# Patient Record
Sex: Female | Born: 1985 | Race: White | Hispanic: No | Marital: Married | Smoking: Never smoker
Health system: Southern US, Community
[De-identification: ages and names within clinical notes are randomized; demographics above are authoritative.]

## PROBLEM LIST (undated history)

## (undated) ENCOUNTER — Inpatient Hospital Stay (HOSPITAL_COMMUNITY): Payer: Self-pay

## (undated) VITALS — BP 124/84 | HR 89 | Temp 97.4°F | Resp 16 | Ht 65.0 in | Wt 186.0 lb

## (undated) DIAGNOSIS — K829 Disease of gallbladder, unspecified: Secondary | ICD-10-CM

## (undated) DIAGNOSIS — K3184 Gastroparesis: Secondary | ICD-10-CM

## (undated) DIAGNOSIS — E1143 Type 2 diabetes mellitus with diabetic autonomic (poly)neuropathy: Secondary | ICD-10-CM

## (undated) DIAGNOSIS — M549 Dorsalgia, unspecified: Secondary | ICD-10-CM

## (undated) DIAGNOSIS — R600 Localized edema: Secondary | ICD-10-CM

## (undated) DIAGNOSIS — F419 Anxiety disorder, unspecified: Secondary | ICD-10-CM

## (undated) DIAGNOSIS — T8859XA Other complications of anesthesia, initial encounter: Secondary | ICD-10-CM

## (undated) DIAGNOSIS — N183 Chronic kidney disease, stage 3 unspecified: Secondary | ICD-10-CM

## (undated) DIAGNOSIS — R079 Chest pain, unspecified: Secondary | ICD-10-CM

## (undated) DIAGNOSIS — D649 Anemia, unspecified: Secondary | ICD-10-CM

## (undated) DIAGNOSIS — E13319 Other specified diabetes mellitus with unspecified diabetic retinopathy without macular edema: Secondary | ICD-10-CM

## (undated) DIAGNOSIS — I10 Essential (primary) hypertension: Secondary | ICD-10-CM

## (undated) DIAGNOSIS — K219 Gastro-esophageal reflux disease without esophagitis: Secondary | ICD-10-CM

## (undated) DIAGNOSIS — R002 Palpitations: Secondary | ICD-10-CM

## (undated) DIAGNOSIS — E103419 Type 1 diabetes mellitus with severe nonproliferative diabetic retinopathy with macular edema, unspecified eye: Secondary | ICD-10-CM

## (undated) DIAGNOSIS — R51 Headache: Secondary | ICD-10-CM

## (undated) DIAGNOSIS — T4145XA Adverse effect of unspecified anesthetic, initial encounter: Secondary | ICD-10-CM

## (undated) DIAGNOSIS — R000 Tachycardia, unspecified: Secondary | ICD-10-CM

## (undated) DIAGNOSIS — R112 Nausea with vomiting, unspecified: Secondary | ICD-10-CM

## (undated) DIAGNOSIS — F319 Bipolar disorder, unspecified: Secondary | ICD-10-CM

## (undated) DIAGNOSIS — E1040 Type 1 diabetes mellitus with diabetic neuropathy, unspecified: Secondary | ICD-10-CM

## (undated) DIAGNOSIS — K5900 Constipation, unspecified: Secondary | ICD-10-CM

## (undated) DIAGNOSIS — F32A Depression, unspecified: Secondary | ICD-10-CM

## (undated) DIAGNOSIS — R0602 Shortness of breath: Secondary | ICD-10-CM

## (undated) DIAGNOSIS — N200 Calculus of kidney: Secondary | ICD-10-CM

## (undated) DIAGNOSIS — M2550 Pain in unspecified joint: Secondary | ICD-10-CM

## (undated) DIAGNOSIS — E1142 Type 2 diabetes mellitus with diabetic polyneuropathy: Secondary | ICD-10-CM

## (undated) DIAGNOSIS — Z9889 Other specified postprocedural states: Secondary | ICD-10-CM

## (undated) DIAGNOSIS — J45909 Unspecified asthma, uncomplicated: Secondary | ICD-10-CM

## (undated) DIAGNOSIS — F329 Major depressive disorder, single episode, unspecified: Secondary | ICD-10-CM

## (undated) HISTORY — DX: Unspecified asthma, uncomplicated: J45.909

## (undated) HISTORY — DX: Shortness of breath: R06.02

## (undated) HISTORY — DX: Chronic kidney disease, stage 3 unspecified: N18.30

## (undated) HISTORY — DX: Constipation, unspecified: K59.00

## (undated) HISTORY — DX: Anxiety disorder, unspecified: F41.9

## (undated) HISTORY — DX: Localized edema: R60.0

## (undated) HISTORY — DX: Gastro-esophageal reflux disease without esophagitis: K21.9

## (undated) HISTORY — DX: Chest pain, unspecified: R07.9

## (undated) HISTORY — PX: REFRACTIVE SURGERY: SHX103

## (undated) HISTORY — DX: Anemia, unspecified: D64.9

## (undated) HISTORY — DX: Palpitations: R00.2

## (undated) HISTORY — PX: CHOLECYSTECTOMY: SHX55

## (undated) HISTORY — DX: Calculus of kidney: N20.0

## (undated) HISTORY — PX: EYE SURGERY: SHX253

## (undated) HISTORY — DX: Gastroparesis: K31.84

## (undated) HISTORY — DX: Disease of gallbladder, unspecified: K82.9

## (undated) HISTORY — DX: Dorsalgia, unspecified: M54.9

## (undated) HISTORY — DX: Headache: R51

## (undated) HISTORY — DX: Pain in unspecified joint: M25.50

---
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
## 2004-03-10 ENCOUNTER — Inpatient Hospital Stay (HOSPITAL_COMMUNITY): Admission: EM | Admit: 2004-03-10 | Discharge: 2004-03-11 | Payer: Self-pay | Admitting: Emergency Medicine

## 2004-03-11 ENCOUNTER — Encounter (INDEPENDENT_AMBULATORY_CARE_PROVIDER_SITE_OTHER): Payer: Self-pay | Admitting: *Deleted

## 2005-07-16 ENCOUNTER — Inpatient Hospital Stay (HOSPITAL_COMMUNITY): Admission: EM | Admit: 2005-07-16 | Discharge: 2005-07-19 | Payer: Self-pay | Admitting: Emergency Medicine

## 2007-05-08 ENCOUNTER — Emergency Department (HOSPITAL_COMMUNITY): Admission: EM | Admit: 2007-05-08 | Discharge: 2007-05-08 | Payer: Self-pay | Admitting: Emergency Medicine

## 2008-02-07 ENCOUNTER — Ambulatory Visit: Payer: Self-pay | Admitting: Family Medicine

## 2008-08-25 ENCOUNTER — Emergency Department (HOSPITAL_COMMUNITY): Admission: EM | Admit: 2008-08-25 | Discharge: 2008-08-25 | Payer: Self-pay | Admitting: Emergency Medicine

## 2008-08-25 ENCOUNTER — Encounter (INDEPENDENT_AMBULATORY_CARE_PROVIDER_SITE_OTHER): Payer: Self-pay | Admitting: *Deleted

## 2008-10-07 ENCOUNTER — Ambulatory Visit: Payer: Self-pay | Admitting: Gastroenterology

## 2008-10-14 ENCOUNTER — Ambulatory Visit: Payer: Self-pay | Admitting: Gastroenterology

## 2009-11-09 ENCOUNTER — Ambulatory Visit: Payer: Self-pay | Admitting: Family Medicine

## 2009-11-15 ENCOUNTER — Inpatient Hospital Stay: Payer: Self-pay | Admitting: Internal Medicine

## 2010-07-16 ENCOUNTER — Encounter (INDEPENDENT_AMBULATORY_CARE_PROVIDER_SITE_OTHER): Payer: Self-pay | Admitting: *Deleted

## 2010-07-16 ENCOUNTER — Inpatient Hospital Stay (HOSPITAL_COMMUNITY): Admission: EM | Admit: 2010-07-16 | Discharge: 2010-07-19 | Payer: Self-pay | Admitting: Emergency Medicine

## 2010-07-17 ENCOUNTER — Ambulatory Visit: Payer: Self-pay | Admitting: Cardiovascular Disease

## 2010-07-17 ENCOUNTER — Encounter (INDEPENDENT_AMBULATORY_CARE_PROVIDER_SITE_OTHER): Payer: Self-pay | Admitting: *Deleted

## 2010-07-17 ENCOUNTER — Encounter (INDEPENDENT_AMBULATORY_CARE_PROVIDER_SITE_OTHER): Payer: Self-pay | Admitting: Internal Medicine

## 2010-07-18 ENCOUNTER — Ambulatory Visit: Payer: Self-pay | Admitting: Internal Medicine

## 2010-07-19 ENCOUNTER — Encounter (INDEPENDENT_AMBULATORY_CARE_PROVIDER_SITE_OTHER): Payer: Self-pay | Admitting: *Deleted

## 2010-08-01 ENCOUNTER — Ambulatory Visit: Payer: Self-pay | Admitting: Internal Medicine

## 2010-08-01 ENCOUNTER — Telehealth: Payer: Self-pay | Admitting: Internal Medicine

## 2010-08-01 DIAGNOSIS — R110 Nausea: Secondary | ICD-10-CM | POA: Insufficient documentation

## 2010-08-01 DIAGNOSIS — R1012 Left upper quadrant pain: Secondary | ICD-10-CM | POA: Insufficient documentation

## 2010-08-01 LAB — CONVERTED CEMR LAB
ALT: 23 units/L (ref 0–35)
AST: 18 units/L (ref 0–37)
Albumin: 3.6 g/dL (ref 3.5–5.2)
Alkaline Phosphatase: 165 units/L — ABNORMAL HIGH (ref 39–117)
Amylase: 25 units/L — ABNORMAL LOW (ref 27–131)
BUN: 9 mg/dL (ref 6–23)
Basophils Absolute: 0.1 10*3/uL (ref 0.0–0.1)
Basophils Relative: 1 % (ref 0.0–3.0)
CO2: 28 meq/L (ref 19–32)
Calcium: 9.8 mg/dL (ref 8.4–10.5)
Chloride: 100 meq/L (ref 96–112)
Creatinine, Ser: 0.6 mg/dL (ref 0.4–1.2)
Eosinophils Absolute: 0.4 10*3/uL (ref 0.0–0.7)
Eosinophils Relative: 4.8 % (ref 0.0–5.0)
GFR calc non Af Amer: 126.37 mL/min (ref >60)
Glucose, Bld: 115 mg/dL — ABNORMAL HIGH (ref 70–99)
HCT: 39.3 % (ref 36.0–46.0)
Hemoglobin: 13.3 g/dL (ref 12.0–15.0)
Lipase: 29 units/L (ref 11.0–59.0)
Lymphocytes Relative: 35.2 % (ref 12.0–46.0)
Lymphs Abs: 2.7 10*3/uL (ref 0.7–4.0)
MCHC: 33.8 g/dL (ref 30.0–36.0)
MCV: 86.3 fL (ref 78.0–100.0)
Monocytes Absolute: 0.5 10*3/uL (ref 0.1–1.0)
Monocytes Relative: 6.5 % (ref 3.0–12.0)
Neutro Abs: 4 10*3/uL (ref 1.4–7.7)
Neutrophils Relative %: 52.5 % (ref 43.0–77.0)
Platelets: 374 10*3/uL (ref 150.0–400.0)
Potassium: 4.2 meq/L (ref 3.5–5.1)
RBC: 4.55 M/uL (ref 3.87–5.11)
RDW: 13.9 % (ref 11.5–14.6)
Sodium: 138 meq/L (ref 135–145)
Total Bilirubin: 0.4 mg/dL (ref 0.3–1.2)
Total Protein: 6.6 g/dL (ref 6.0–8.3)
WBC: 7.6 10*3/uL (ref 4.5–10.5)

## 2010-08-02 ENCOUNTER — Ambulatory Visit: Payer: Self-pay | Admitting: Gastroenterology

## 2010-08-02 ENCOUNTER — Encounter: Payer: Self-pay | Admitting: Nurse Practitioner

## 2010-08-02 DIAGNOSIS — E1010 Type 1 diabetes mellitus with ketoacidosis without coma: Secondary | ICD-10-CM | POA: Insufficient documentation

## 2010-08-02 DIAGNOSIS — K561 Intussusception: Secondary | ICD-10-CM | POA: Insufficient documentation

## 2010-08-02 DIAGNOSIS — Z8719 Personal history of other diseases of the digestive system: Secondary | ICD-10-CM | POA: Insufficient documentation

## 2010-08-02 DIAGNOSIS — R3919 Other difficulties with micturition: Secondary | ICD-10-CM | POA: Insufficient documentation

## 2010-08-04 ENCOUNTER — Ambulatory Visit (HOSPITAL_COMMUNITY): Admission: RE | Admit: 2010-08-04 | Discharge: 2010-08-04 | Payer: Self-pay | Admitting: Internal Medicine

## 2010-08-04 LAB — CONVERTED CEMR LAB
Bilirubin Urine: NEGATIVE
Ketones, ur: NEGATIVE mg/dL
Nitrite: POSITIVE
Specific Gravity, Urine: 1.015 (ref 1.000–1.030)
Total Protein, Urine: 30 mg/dL
Urine Glucose: NEGATIVE mg/dL
Urobilinogen, UA: 0.2 (ref 0.0–1.0)
pH: 7.5 (ref 5.0–8.0)

## 2010-08-08 ENCOUNTER — Telehealth: Payer: Self-pay | Admitting: Internal Medicine

## 2010-08-08 DIAGNOSIS — E1143 Type 2 diabetes mellitus with diabetic autonomic (poly)neuropathy: Secondary | ICD-10-CM | POA: Insufficient documentation

## 2010-08-08 DIAGNOSIS — Z8639 Personal history of other endocrine, nutritional and metabolic disease: Secondary | ICD-10-CM | POA: Insufficient documentation

## 2010-08-08 DIAGNOSIS — J90 Pleural effusion, not elsewhere classified: Secondary | ICD-10-CM | POA: Insufficient documentation

## 2010-08-08 DIAGNOSIS — Z8679 Personal history of other diseases of the circulatory system: Secondary | ICD-10-CM | POA: Insufficient documentation

## 2010-08-08 DIAGNOSIS — K3184 Gastroparesis: Secondary | ICD-10-CM

## 2010-08-08 DIAGNOSIS — Z862 Personal history of diseases of the blood and blood-forming organs and certain disorders involving the immune mechanism: Secondary | ICD-10-CM | POA: Insufficient documentation

## 2010-08-08 DIAGNOSIS — Z87448 Personal history of other diseases of urinary system: Secondary | ICD-10-CM | POA: Insufficient documentation

## 2010-08-08 DIAGNOSIS — I498 Other specified cardiac arrhythmias: Secondary | ICD-10-CM

## 2010-08-09 ENCOUNTER — Ambulatory Visit: Payer: Self-pay | Admitting: Internal Medicine

## 2010-08-10 ENCOUNTER — Encounter: Payer: Self-pay | Admitting: Internal Medicine

## 2010-08-10 DIAGNOSIS — K8020 Calculus of gallbladder without cholecystitis without obstruction: Secondary | ICD-10-CM | POA: Insufficient documentation

## 2010-08-11 ENCOUNTER — Encounter (INDEPENDENT_AMBULATORY_CARE_PROVIDER_SITE_OTHER): Payer: Self-pay | Admitting: *Deleted

## 2010-08-11 ENCOUNTER — Encounter: Admission: RE | Admit: 2010-08-11 | Discharge: 2010-08-11 | Payer: Self-pay | Admitting: General Surgery

## 2010-08-11 ENCOUNTER — Telehealth: Payer: Self-pay | Admitting: Internal Medicine

## 2010-08-12 ENCOUNTER — Inpatient Hospital Stay (HOSPITAL_COMMUNITY): Admission: AD | Admit: 2010-08-12 | Discharge: 2010-08-14 | Payer: Self-pay | Admitting: General Surgery

## 2010-08-13 ENCOUNTER — Encounter (INDEPENDENT_AMBULATORY_CARE_PROVIDER_SITE_OTHER): Payer: Self-pay | Admitting: General Surgery

## 2010-08-30 ENCOUNTER — Encounter: Payer: Self-pay | Admitting: Internal Medicine

## 2010-09-06 ENCOUNTER — Ambulatory Visit: Payer: Self-pay | Admitting: Internal Medicine

## 2010-10-29 ENCOUNTER — Encounter: Payer: Self-pay | Admitting: Internal Medicine

## 2010-11-01 ENCOUNTER — Emergency Department (HOSPITAL_COMMUNITY)
Admission: EM | Admit: 2010-11-01 | Discharge: 2010-11-01 | Payer: Self-pay | Source: Home / Self Care | Admitting: Emergency Medicine

## 2010-11-08 NOTE — Progress Notes (Signed)
Summary: Symptoms are getting worse  Phone Note Call from Patient Call back at Home Phone [phone]   Call For: Dr Juanda Chance Summary of Call: Her symptoms are getting worse and Pam told her to call back immediately. Has an appt with Dr Juanda Chance tomorrow Initial call taken by: Leanor Kail [facility],  August 08, 2010 12:45 PM  Follow-up for Phone Call        Patient  is having a lot of nausea.  She has an office visit tomorrow  with  Dr Juanda Chance to discuss surgical referral.  Pain is slightly improved.  She c/o chills, but denies fever.  She is asked to remain on a clear liquid diet while having nausea can advance as tolerated.  Dr Juanda Chance can we send her something for nausea? Follow-up by: Darcey Nora RN, CGRN,  August 08, 2010 1:46 PM  Additional Follow-up for Phone Call Additional follow up Details #1::        I will be seeing her in the office tomorrow. Additional Follow-up by: Hart Carwin MD,  August 08, 2010 10:17 PM

## 2010-11-08 NOTE — Assessment & Plan Note (Signed)
Summary: F/U RUQ pain, HIDA SCAN results, saw Lafe Garin NP   History of Present Illness Primary GI MD: Sheryn Bison MD FACP FAGA Primary Provider: Reynolds Bowl MD Chief Complaint: Increased Left sided mid intermittant abd pain with constant dull discomfort. Pt states the pain is worse at night and also she has a lot of nausea.  History of Present Illness:   This is a 25 year old white female type I diabetic with persistent upper abdominal pain extending from the left to right upper quadrant. She was hospitalized with acute abdominal pain 4 weeks ago. A CT scan revealed mild pancreatitis felt to be due to ACE inhibitors or possibly biliary. She was well 2 days after discharge but then started having pain again. She has a history of gastroparesis, asthma, depression and urinary tract infections. The pain has been getting worse and she started having chills. She has not able to eat well. A hepatobiliary scan last week with [facility] showed a low ejection fraction of 15%. She was asked to take her Vicodin prior to the HIDA scan. Her liver function tests showed an initial elevation of AST 90, ALT 46 and elevated alkaline phosphatase to 141. Her most recent alkaline phosphatase has been 165 with normalization of AST to 18 and ALT to 23. Her amylase was initially 63 and lipase elevated at 66 but her most recent amylase and lipase were normal on October 24. She has been on Cipro 500 mg twice a day for the past 6 days.   GI Review of Systems    Reports abdominal pain, heartburn, and  nausea.     Location of  Abdominal pain: mid left side.    Denies acid reflux, belching, bloating, chest pain, dysphagia with liquids, dysphagia with solids, loss of appetite, vomiting, vomiting blood, weight loss, and  weight gain.        Denies anal fissure, black tarry stools, change in bowel habit, constipation, diarrhea, diverticulosis, fecal incontinence, heme positive stool, hemorrhoids, irritable bowel syndrome,  jaundice, light color stool, liver problems, rectal bleeding, and  rectal pain.    Current Medications (verified): 1)  Vicodin 5-500 Mg Tabs (Hydrocodone-Acetaminophen) .... Take 1-2 Tablets Every 4-6 Hours As Needed 2)  Ciprofloxacin Hcl 500 Mg Tabs (Ciprofloxacin Hcl) .... Take One Two Times A Day 3)  Lantus 100 Unit/ml Soln (Insulin Glargine) .... 54 Untis Daily 4)  Humalog 100 Unit/ml Soln (Insulin Lispro (Human)) .... Sliding Scale 5)  Carvedilol 25 Mg Tabs (Carvedilol) .... One Tablet By Mouth Two Times A Day 6)  Reglan 10 Mg Tabs (Metoclopramide Hcl) .... One Tablet By Mouth Four Times A Day 7)  Albuterol Sulfate (2.5 Mg/65ml) 0.083% Nebu (Albuterol Sulfate) .... As Needed 8)  Klonopin 0.5 Mg Tabs (Clonazepam) .... One Tablet By Mouth Two Times A Day  Allergies (verified): 1)  ! * Cefepime  Past History:  Past Medical History: Reviewed history from 08/02/2010 and no changes required. gastroparesis Asthma Depression Diabetes Pancreatitis Pneumonia Urinary Tract Infection  Past Surgical History: Reviewed history from 08/02/2010 and no changes required. Unremarkable  Family History: Reviewed history from 08/08/2010 and no changes required. Family History of Diabetes: brother No FH of Colon Cancer:  Social History: Reviewed history from 08/02/2010 and no changes required. 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  Alcohol Use - no Daily Caffeine Use 2 per day Illicit Drug Use - no Patient gets regular exercise.  Review of Systems  The patient denies allergy/sinus, anemia, anxiety-new, arthritis/joint pain, back pain, blood in urine,  breast changes/lumps, change in vision, confusion, cough, coughing up blood, depression-new, fainting, fatigue, fever, headaches-new, hearing problems, heart murmur, heart rhythm changes, itching, menstrual pain, muscle pains/cramps, night sweats, nosebleeds, pregnancy symptoms, shortness of breath, skin rash, sleeping problems, sore  throat, swelling of feet/legs, swollen lymph glands, thirst - excessive , urination - excessive , urination changes/pain, urine leakage, vision changes, and voice change.         .Pertinent positive and negative review of systems were noted in the above HPI. All other ROS was otherwise negative.   Vital Signs:  Patient profile:   25 year old female Height:      66 inches Weight:      174 pounds BMI:     28.19 Temp:     97.7 degrees F oral Pulse rate:   100 / minute Pulse rhythm:   regular BP sitting:   124 / 72  (right arm) Cuff size:   regular  Vitals Entered By: Christie Nottingham CMA Duncan Dull) (August 09, 2010 4:17 PM)  Physical Exam  General:  Well developed, well nourished, no acute distress. Eyes:  PERRLA, no icterus. Mouth:  No deformity or lesions, dentition normal. Neck:  Supple; no masses or thyromegaly. Lungs:  Clear throughout to auscultation. Heart:  Regular rate and rhythm; no murmurs, rubs,  or bruits. Abdomen:  soft abdomen with diffuse tenderness across left upper quadrant, epigastrium and mostly right upper quadrant. There was no rebound but the abdomen was tender on light touch. Lower abdomen was unremarkable. No CVA tenderness. No ascites. Extremities:  No clubbing, cyanosis, edema or deformities noted. Skin:  Intact without significant lesions or rashes. Psych:  Alert and cooperative. Normal mood and affect.   Impression & Recommendations:  Problem # 1:  Hx of PANCREATITIS, ACUTE, HX OF (ICD-V12.70)  Patient had recent acute pancreatitis thought to be most likely due to a biliary origin or ACE inhibitors. Her HIDA scan is abnormal and her liver function tests were slightly elevated. I feel that we are dealing with biliary pancreatitis and a poorly functioning gallbladder. She is a diabetic and will likely benefit from a laparoscopic cholecystectomy. I will keep her on antibiotics including Augmentin 875 mg daily and a strict low fat diet until she can see a  Careers adviser. If her pain becomes worse, I have asked her to go to emergency room. She had an upper endoscopy about 4 years ago.  Orders: [facility])  Problem # 2:  LIVER FUNCTION TESTS, ABNORMAL, HX OF (ICD-V12.2) Patient has mild elevation of liver function tests with no evidence of extrahepatic biliary obstruction.  Problem # 3:  DIABETES MELLITUS, TYPE I, UNCONTROLLED, WITH KETOACIDOSIS (ICD-250.13) Patient has type 1 diabetes with recent DKA. She is currently under reasonable control.  Problem # 4:  Hx of BOWEL INTUSSUSCEPTION (ICD-560.0) This was an incidental finding on a CT scan of the abdomen done on her recent admission. There are no symptoms suggestive of bowel obstruction.  Patient Instructions: 1)  Please pick up your prescriptions at the pharmacy. Electronic prescription(s) has already been sent for Augmentin 875 mg once daily x 10 days.  2)  We will contact you tommorrow morning regarding an appointment for surgical consult at [facility] 3)  Copy sent to : Reynolds Bowl, MD 4)  The medication list was reviewed and reconciled.  All changed / newly prescribed medications were explained.  A complete medication list was provided to the patient / caregiver. Prescriptions: HYDROCODONE-ACETAMINOPHEN 7.5-500  MG TABS (HYDROCODONE-ACETAMINOPHEN) Take 1 tablet every 4-6 hours as needed for severe pain  #20 x 0   Entered by:   Lamona Curl CMA (AAMA)   Authorized by:   Hart Carwin MD   Signed by:   Lamona Curl CMA (AAMA) on 08/09/2010   Method used:   Printed then faxed to ...       [facility] [address] (retail)       [address]       [place], [place]  [address]       Ph: [phone]       Fax: [phone]   RxID:   [id number]

## 2010-11-08 NOTE — Discharge Summary (Signed)
Summary: Diabetic Ketoacidosis   NAME:  [name], [name]                     ACCOUNT NO.:  [id number]   MEDICAL RECORD NO.:  [id number]                   PATIENT TYPE:  INP   LOCATION:  3702                                 FACILITY:  [facility]   PHYSICIAN:  Corinna L. Lendell Caprice, MD             DATE OF BIRTH:  [birth year]   DATE OF ADMISSION:  03/10/2004  DATE OF DISCHARGE:  03/11/2004                                 DISCHARGE SUMMARY   DIAGNOSES:  1. Diabetic ketoacidosis.  2. Acute bronchitis.   DISCHARGE MEDICATIONS:  1. Azithromycin 250 mg p.o. every day for four more days.  2. Allegra 60 mg p.o. b.i.d. as needed for allergies/congestion.  3. Flonase two sprays per nostril every day.  4. She is to continue her Lantus 56 units subcutaneously every day and     sliding scale Humalog with meals.   CONDITION AT DISCHARGE:  Stable.   ACTIVITY:  Ad lib.   FOLLOW UP:  Follow up with Dr. Talmage Nap in 1-2 weeks.   HISTORY AND HOSPITAL COURSE:  [name] is a pleasant 25 year old type 1  diabetic who presented to the emergency room with high blood sugars.  She  had a sugar that she noted at home that was over 600.  She was feeling bad,  nauseated, and feeling weak.  She admitted to poor compliance with her  diabetes regimen.  Specifically, she was not checking her sugars very  regularly, and she skipped many of her sliding scale Humalog doses.  She  reported that her blood sugars often were in the 300 and 400s at home, even  prior to this episode.  She has seen Dr. Talmage Nap in the past but has not seen  her for a year.  She complained of some ear popping, nasal congestion, and  cough.   PHYSICAL EXAMINATION:  VITAL SIGNS:  She had normal vital signs.  HEENT:  Moist mucous membranes and a serous otitis.  LUNGS:  She had clear lung sounds and otherwise normal examination.   Initial blood work, in the emergency room, showed a pH of 7.315, pCO2 was  35, pO2 was 96.  Her CBC was unremarkable.   Her sodium was 129, potassium  4.3, chloride 99, bicarbonate 14, glucose 254, and she had moderate acetone  in her blood.  UA revealed greater than 1,000 glucose and greater than 80  ketones.  Negative nitrites, negative leukocyte esterase.   Her chest x-ray was negative.   The patient was given IV hydration, insulin, and admitted to the hospital.  Her repeat B-MET showed closing of her anion gap and her sugars remained in  the 100-200 range.  During her hospitalization, she reported that her sinus  congestion, cough, and sore throat had worsened.  It was felt that she may  have an acute bronchitis and as she was in DKA, we have elected to treat  this with azithromycin and supportive care.  I have encouraged her to be  more rigorous about her diabetes regimen and she will follow up with Dr.  Talmage Nap.                                                Corinna L. Lendell Caprice, MD    CLS/MEDQ  D:  03/11/2004  T:  03/13/2004  Job:  [id number]   cc:   Dorisann Frames, M.D.  [address] [place] [address]  Fax: [phone]

## 2010-11-08 NOTE — Progress Notes (Signed)
Summary: Triage  Phone Note Call from Patient Call back at Home Phone [phone]   Caller: Patient Call For: Dr. Juanda Chance Reason for Call: Talk to Nurse Summary of Call: feels like she is running a temp and diarrhea x5 times the last hour and "yellow"....having surgery tomorrow Initial call taken by: Karna Christmas,  August 11, 2010 12:13 PM  Follow-up for Phone Call        Patient  had a CT this AM and is at [facility]  for her preop visit. She is having surgery tomorrow. She has had diarrhea x5 in the last hour and stool is yellow. Abdominal pain is worse right and left side now. Has taken Vicodin 2 by mouth. Feels like she has a fever but has not checked her temperature. Per Dr. Juanda Chance patient needs to call her surgeon for  this. Patient instructed on Dr. Regino Schultze recommendation. Follow-up by: Jesse Fall RN,  August 11, 2010 1:38 PM     Appended Document: Triage Received a call from [facility] @ pre op at [facility]. She just wanted to let us know patient did call Dr. Claud Kelp. He told patient to go to ER or "ride it out until surgery tomorrow."

## 2010-11-08 NOTE — Letter (Signed)
Summary: Out of Work  [facility]  [address] [place], [place] [address]   Phone: [phone]  Fax: [phone]    August 02, 2010   Employee:  [employer] Sheralyn Boatman    To Whom It May Concern:   For Medical reasons, please excuse the above named employee from work for the following dates:  Start:   08-01-10  End:   08-04-10  If you need additional information, please feel free to contact our office.         Sincerely,

## 2010-11-08 NOTE — Consult Note (Signed)
Summary: Abdominal Pain    NAME:  [name], [name]                ACCOUNT NO.:  [id number]      MEDICAL RECORD NO.:  [id number]          PATIENT TYPE:  INP      LOCATION:  1224                         FACILITY[facility]  [facility]      PHYSICIAN:  Angelia Mould. Derrell Lolling, M.D.DATE OF BIRTH:  1986-07-10      DATE OF CONSULTATION:  07/17/2010   DATE OF DISCHARGE:                                    CONSULTATION         CHIEF COMPLAINT:  Abdominal pain.      HISTORY:  This is a 25 year old Caucasian female with a history of   insulin-dependent diabetes.  She has had prior hospitalizations for   diabetic ketoacidosis.      She does state that she has not been taking her insulin properly and has   not been monitoring her blood sugar lately.  She was feeling okay until   the early morning hours of Saturday, July 16, 2010.  At that time, she   noted the rather abrupt onset of left upper quadrant pain, which very   quickly radiated across the entire upper abdomen and has persisted   across the entire upper abdomen.  She has had some nausea, but no   vomiting.  She has had a little bit of chills, but no fever.  She denies   ever having pain like this in the past.  She has not had any change in   her stools or bowel habits.  She has been passing flatus today.      When the pain persisted and she could not tolerate it, she came to the   [facility] Emergency Room yesterday mid morning and was admitted by the   medical service.  She was found to have a blood sugar of 761, creatinine   of 1.78, total bilirubin of 2.8, normal transaminases, lipase of 395,   white blood cell count of 8600, and a lactic acid of 3.1, consistent   with diabetic ketoacidosis.  They performed an ultrasound, which showed   the question of a tiny right kidney stone, but her gallbladder and   biliary tract looked normal.  She was thought to have mild fatty   infiltration of the liver.  A CT scan of the abdomen and pelvis was   done, which  was consistent with mild pancreatitis with stranding around   the pancreas and also showed an incidental, short segment   intussusception of the small bowel in the left upper quadrant.  There   was no inflammation or obstruction.  I have discussed this with Dr. Kennith Center and he feels that this is fairly trivial finding and this likely   to resolve spontaneously.      The CT scan results were obtained.  The internal medicine physician   called me for my opinion regarding her abdominal pain and CT scan   findings.      PAST HISTORY:   1. Insulin-dependent diabetes mellitus, type 1.   2. Asthma.   3.  Atrial tachycardia.   4. Gastroparesis.   5. She has never had any surgery.      HOME MEDICATIONS:   1. Lisinopril 10 mg a day.   2. Lasix 20 mg a day.   3. Coreg 25 mg b.i.d.   4. Clonazepam 0.5 mg p.r.n.   5. She is on Lantus insulin and sliding scale short-acting insulin.      DRUG ALLERGIES:  CEPHALOSPORINS.      SOCIAL HISTORY:  She lives across the street from her parents.  She   denies alcohol or tobacco.  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      FAMILY HISTORY:  She has a brother with diabetes, otherwise   noncontributory.      REVIEW OF SYSTEMS:  A 10-system review of systems is performed and is   noncontributory except as described above.      PHYSICAL EXAMINATION:  GENERAL:  This is an overweight Caucasian female   who is in no obvious distress at this time.  Her parents are in the room   with her.  VITAL SIGNS:  Temperature 98.2, heart rate 87 and regular   sinus rhythm, blood pressure 106/54, respirations 16 and unlabored,   oxygen saturation 100% on room air.   EYES:  Sclerae clear.  Extraocular movements intact.   EARS, NOSE, MOUTH, THROAT, NOSE, LIPS, TONGUE, AND OROPHARYNX:  Without   gross lesions.   NECK:  Supple, nontender.  No mass.  No jugular venous distention.   LUNGS:  Clear to auscultation.  No rales.  No rhonchi.  No chest  wall   tenderness.   HEART:  Regular rate and rhythm.  No murmurs.  No ectopy.  Radial and   femoral pulses are palpable.   BREASTS:  Not examined.   ABDOMEN:  Somewhat obese.  Nondistended.  Soft.  She is tender with   guarding in the epigastrium and to a lesser extent right upper quadrant   and left upper quadrant.  There are no real obvious peritoneal signs.   There is no mass.  There are no scars.  There are no hernias.  Liver and   spleen are not palpable.   EXTREMITIES:  She moves all 4 extremities well without pain or   deformity.   NEUROLOGIC:  No gross motor sensory deficits.      ASSESSMENT:   1. Acute pancreatitis.  This is the most likely cause of her abdominal       pain and abdominal tenderness.  The exact etiology of this is       unclear following review of her CT and ultrasound.   2. Small bowel intussusception.  Considering the absence of       inflammatory change or obstruction and a very short segmental       nature of this, I agree with Dr. Molli Posey that this is almost       certainly an incidental finding and will almost certainly resolve       and may have actually already resolved.  We will have to confirm       this by followup CT because she is likely to continue to have       abdominal pain from her pancreatitis.   3. Insulin-dependent diabetes mellitus.   4. Acute diabetic ketoacidosis.   5. Poor compliance with chronic management of her diabetes.      PLAN:   1. She should be kept  at bowel rest, placed on proton pump inhibitors,       and maintain her hydration and be given analgesics to treat her       pancreatitis.  Hopefully, this will resolve without complications.   2. We will repeat her CT scan of abdomen and pelvis this afternoon       with oral contrast only to make sure that the intussusception has       resolved, which is my expectation.   3. Management of her diabetes is already being taken care.               Angelia Mould. Derrell Lolling, M.D.                HMI/MEDQ  D:  07/17/2010  T:  07/18/2010  Job:  [id number]      cc:   Tonita Cong, M.D.      Hind Bosie Helper, MD      Electronically Signed by Claud Kelp M.D. on 07/21/2010 07:53:09 AM

## 2010-11-08 NOTE — Letter (Signed)
Summary: [facility]   Imported By: Sherian Rein 08/29/2010 11:47:49  _____________________________________________________________________  External Attachment:    Type:   Image     Comment:   External Document

## 2010-11-08 NOTE — Assessment & Plan Note (Signed)
Summary: f/u hospital hx pancreatitis/regina   History of Present Illness Visit Type: Initial Visit Primary GI MD: Sheryn Bison MD FACP FAGA Primary Marlo Arriola: Reynolds Bowl MD Chief Complaint: hosp f/u pancreatitis History of Present Illness:   Patient is a 25 year old female with history of Type I Diabetes, recently hospitalized with abdominal pain, nausea and DKA. CTscan revealed mild pancreatitis. Onset of pain was acute, severe. Never had this pain before. She does not drink ETOH. Pancreatitis was felt to be secondary to ACE inhibitor which she has since stopped.   Patient here today for recurrent pain which started 3 days ago while at work. Pain in LUQ with some radiation to back yesterday but not today. Pain constant but intermittently intensifies for unclear reasons. Called our office yesterday, got labs, abdominal films and Vicodin.  Yesterday pain was 7/10, today a 4/10.  Patient had been eating fried foods which worsened the pain. She is now on a liquid diet but  yesterday tolerated eggs without any increasing pain. She is taking the Vicodin but hasn't had any in last several hours.  A few days ago patient had several episodes of diarrhea but that has resolved. Then, yesterday she had urinary urgency/ presssure but none today.This morning her urine is dark, cloudy, and malodorous. Has history of UTIs but those usually cause lower abdominal pressure, her current pain is different.    She has occasional reflux when blood sugars are high.    GI Review of Systems    Reports abdominal pain, acid reflux, belching, and  bloating.     Location of  Abdominal pain: upper abdomen.    Denies chest pain, dysphagia with liquids, dysphagia with solids, heartburn, loss of appetite, nausea, vomiting, vomiting blood, weight loss, and  weight gain.      Reports constipation and  diarrhea.     Denies anal fissure, black tarry stools, change in bowel habit, diverticulosis, fecal incontinence, heme  positive stool, hemorrhoids, irritable bowel syndrome, jaundice, light color stool, liver problems, rectal bleeding, and  rectal pain.    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  Alcohol Use - no Daily Caffeine Use 2 per day Illicit Drug Use - no Patient gets regular exercise. Smoking Status:  never Drug Use:  no Does Patient Exercise:  yes  Review of Systems       The patient complains of allergy/sinus, back pain, depression-new, headaches-new, menstrual pain, skin rash, and swelling of feet/legs.  The patient denies anemia, anxiety-new, arthritis/joint pain, blood in urine, breast changes/lumps, change in vision, confusion, cough, coughing up blood, fainting, fatigue, fever, hearing problems, heart murmur, heart rhythm changes, itching, muscle pains/cramps, night sweats, nosebleeds, pregnancy symptoms, shortness of breath, sleeping problems, sore throat, swollen lymph glands, thirst - excessive , urination - excessive , urination changes/pain, urine leakage, vision changes, and voice change.    Vital Signs:  Patient profile:   25 year old female Height:      66 inches Weight:      173 pounds BMI:     28.02 Pulse rate:   72 / minute Pulse rhythm:   regular BP sitting:   110 / 60  (right arm)  Vitals Entered By: Chales Abrahams CMA Duncan Dull) (August 02, 2010 2:16 PM)  Physical Exam  General:  Well developed, well nourished, no  acute distress. Head:  Normocephalic and atraumatic. Eyes:  Conjunctiva pink, no icterus.  Mouth:  No deformity or lesions, dentition normal. Neck:  Supple; no masses or thyromegaly. Lungs:  Clear throughout to auscultation. Heart:  Regular rate and rhythm; no murmurs, rubs,  or bruits. Abdomen:  Abdomen soft,  nondistended. There is moderate LUQ and epigastric tenderness. No obvious masses or hepatomegaly.Normal bowel sounds.  Msk:  Symmetrical with no gross deformities. Normal posture. Extremities:  No palmar erythema, no edema.  Neurologic:  Alert and  oriented x4;  grossly normal neurologically. Skin:  Intact without significant lesions or rashes. Cervical Nodes:  No significant cervical adenopathy. Psych:  Alert and cooperative. Normal mood and affect.   Impression & Recommendations:  Problem # 1:  Hx of PANCREATITIS, ACUTE, HX OF (ICD-V12.70) Assessment Comment Only Hospitalized Oct. 8th through 11th for abdominal pain and DKA.  Ultrasound negative for gallstones. Non contrast CTscan of abdomen and pelvis 07/17/10 showed  mild acute pancreatitis as well as small bowel intussusception. Follow up MRCP on 07/19/10 revealed resolution of small bowel intussusception but mild perihepatic ascites, mesenteric edema, subcutaneous edema, and pericholecystic fluid were seen.  There was a question associated gallbladder wall thickening as well.  CBD and pancreatic ducts were normal.   Exact etiology of pancreatitis unknown but felt to have possibly been related to ace inhibitor. Although pancreatic divisum could not be totally excluded, the typical dorsal duct dilatation seen in pancreas divisum was not evident on MRCP.  Problem # 2:  ABDOMINAL PAIN, LEFT UPPER QUADRANT AND EPIGASTRIUM Assessment: Deteriorated Same pain as when found to have mild acute pancreatitis. KUB, amylase, lipase, CBC and CMET done yesterday were all normal. MRCP did show some pericholecystic fluid and possible gallbladder wall thickening. After calling our office yesterday patient was started on Cipro and scheduled for HIDA scan. We have arranged for patient to have HIDA scan done within next two days. In meantime, continue Vicodin as needed, stay on clear liquid diet. We should have HIDA results by Friday.   Problem # 3:  OTHER  ABNORMALITY OF URINATION (HYQ-657.84) Assessment: New Complains of cloudy, malodorous urine. Will check U/A.   Problem # 4:  DIABETES MELLITUS, TYPE I, UNCONTROLLED, WITH KETOACIDOSIS (ICD-250.13) Recently hospitalized with DKA.   Problem # 5:  Hx of SMALL BOWEL INTUSSUSCEPTION (ICD-560.0) Assessment: Comment Only Resolved. It should be noted that patient was evaluated in hospital by surgery who didn't feel this was cause of her abdominal pain.   Other Orders: HIDA Scan (HIDA SCAN) TLB-Udip w/ Micro (81001-URINE)  Patient Instructions: 1)  Clear liquid until Thursday the 27th.  2)  We scheduled the Hida Scan for 08-04-10 at [facility]. 3)  Directions provided. Location [facility].  4)  We will call you with the Hida Scan results. 5)  Continue the Cipro.  6)  If fever or severe abdominal pain call our office, [phone]. 7)  We made you an appointment with Dr. Juanda Chance for 09-06-10 at 2:45 PM . 8)  Copy sent to : Reynolds Bowl, Md

## 2010-11-08 NOTE — Assessment & Plan Note (Signed)
Summary: F/U HX Pancreatitis, ABD pain, saw Lafe Garin NP   History of Present Illness Visit Type: Follow-up Visit Primary GI MD: Lina Sar MD Primary Provider: Jerrye Noble, MD  Requesting Provider: na Chief Complaint: F/u for pancreatitis. Pt c/o occ diarrhea and denies any other GI complaints  History of Present Illness:   This is a 25 year old white female Type 1 diabetic who is status post laparoscopic cholecystectomy 3 weeks ago by Dr Derrell Lolling. She is feeling well. Patient denies abdominal pain, fever or diarrhea. She has continued on  diabetic diet. She had an abnormal HIDA scan prior to the lap chole  with 15% ejection fraction consistent with biliary dyskinesia. She required hospitalization for pancreatitis and abnormal liver function tests. She is now doing well.   GI Review of Systems      Denies abdominal pain, acid reflux, belching, bloating, chest pain, dysphagia with liquids, dysphagia with solids, heartburn, loss of appetite, nausea, vomiting, vomiting blood, weight loss, and  weight gain.      Reports diarrhea.     Denies anal fissure, black tarry stools, change in bowel habit, constipation, diverticulosis, fecal incontinence, heme positive stool, hemorrhoids, irritable bowel syndrome, jaundice, light color stool, liver problems, rectal bleeding, and  rectal pain.    Current Medications (verified): 1)  Lantus 100 Unit/ml Soln (Insulin Glargine) .... 56 Untis Daily 2)  Humalog 100 Unit/ml Soln (Insulin Lispro (Human)) .... Sliding Scale 3)  Carvedilol 25 Mg Tabs (Carvedilol) .... One Tablet By Mouth Two Times A Day 4)  Reglan 10 Mg Tabs (Metoclopramide Hcl) .... One Tablet By Mouth Four Times A Day 5)  Albuterol Sulfate (2.5 Mg/16ml) 0.083% Nebu (Albuterol Sulfate) .... As Needed 6)  Klonopin 0.5 Mg Tabs (Clonazepam) .... One Tablet By Mouth Two Times A Day 7)  Keflex 500 Mg Caps (Cephalexin) .... One Tablet By Mouth Two Times A Day For Toe Infection  Allergies  (verified): 1)  ! * Cefepime  Past History:  Past Medical History: Gastroparesis Asthma Depression Diabetes Pancreatitis Pneumonia Urinary Tract Infection  Biliary dyskinesia  Past Surgical History: Laparoscopic cholecystectomy with intraoperative cholangiogram  Family History: Reviewed history from 08/08/2010 and no changes required. Family History of Diabetes: brother No FH of Colon Cancer:  Social History: Reviewed history from 08/02/2010 and no changes required. 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  Alcohol Use - no Daily Caffeine Use 2 per day Illicit Drug Use - no Patient gets regular exercise.  Review of Systems  The patient denies allergy/sinus, anemia, anxiety-new, arthritis/joint pain, back pain, blood in urine, breast changes/lumps, change in vision, confusion, cough, coughing up blood, depression-new, fainting, fatigue, fever, headaches-new, hearing problems, heart murmur, heart rhythm changes, itching, menstrual pain, muscle pains/cramps, night sweats, nosebleeds, pregnancy symptoms, shortness of breath, skin rash, sleeping problems, sore throat, swelling of feet/legs, swollen lymph glands, thirst - excessive , urination - excessive , urination changes/pain, urine leakage, vision changes, and voice change.         Pertinent positive and negative review of systems were noted in the above HPI. All other ROS was otherwise negative.   Vital Signs:  Patient profile:   25 year old female Height:      66 inches Weight:      179 pounds BMI:     29.00 BSA:     1.91 Pulse rate:   104 / minute Pulse rhythm:   regular BP sitting:   124 / 80  (left arm) Cuff size:  regular  Vitals Entered By: Ok Anis CMA (September 06, 2010 2:56 PM)  Physical Exam  General:  Well developed, well nourished, no acute distress. Eyes:  PERRLA, no icterus. Mouth:  No deformity or lesions, dentition normal. Neck:  Supple; no masses or thyromegaly. Lungs:  Clear throughout  to auscultation. Heart:  Regular rate and rhythm; no murmurs, rubs,  or bruits. Abdomen:  post laparoscopic cholecystectomy scars. Normoactive bowel sounds. No tenderness Extremities:  No clubbing, cyanosis, edema or deformities noted. Skin:  Intact without significant lesions or rashes. Psych:  Alert and cooperative. Normal mood and affect.   Impression & Recommendations:  Problem # 1:  CHOLELITHIASIS (ICD-574.20) Patient is status post laparoscopic cholecystectomy on 08/13/10. She is doing well.  Problem # 2:  GASTROPARESIS (ICD-536.3) There are currently no symptoms of gastroparesis.  Problem # 3:  Hx of BOWEL INTUSSUSCEPTION (ICD-560.0) This was an incidental finding only. No symptoms at this time.  Patient Instructions: 1)  Diabetic modified diet. 2)  Return p.r.n. 3)  Copy sent to : Dr Reynolds Bowl 4)  The medication list was reviewed and reconciled.  All changed / newly prescribed medications were explained.  A complete medication list was provided to the patient / caregiver.

## 2010-11-08 NOTE — Discharge Summary (Signed)
Summary: Pancreatitis, Small Bowel Intusssception  [name], [name]                ACCOUNT NO.:  [id number]      MEDICAL RECORD NO.:  [id number]          PATIENT TYPE:  INP      LOCATION:  1414                         FACILITY:  [facility]      PHYSICIAN:  Hind I Elsaid, MD      DATE OF BIRTH:  [birth year]      DATE OF ADMISSION:  07/16/2010   DATE OF DISCHARGE:  07/19/2010                                  DISCHARGE SUMMARY         PRIMARY CARE PHYSICIAN:  Unassigned.      DISCHARGE DIAGNOSES:   1. Diabetic ketoacidosis, severe.   2. Acute mild pancreatitis, improved.   3. Small bowel intussusception, resolved.   4. History of atrial tachycardia.   5. Acute renal failure, resolved.   6. Mildly elevated LFTs, will be followed by Dr. Juanda Chance.   7. Hypophosphatemia, corrected.   8. Elevated cardiac enzymes, felt to be secondary to the acute       illness.      A 2-D echo did show ejection fraction of 60% to 65% with normal systolic   function and no wall motion abnormalities.      HISTORY OF PRESENT ILLNESS:  This is a 25 year old female with a history   of insulin-dependent diabetes mellitus, noncompliance; history of   tachycardia and asthma.  She was admitted with severe upper abdominal   pain, diffuse, associated with nausea.  On presentation, the patient had   vital signs, temperature 97.9, blood pressure 137/75, pulse rate was   126, respiratory rate of 30.  Abdominal examination, there was diffuse   tenderness.  She was found to have a sodium of 147, potassium 5.6,   chloride 99, CO2 of 6, glucose of 760, BUN of 19, and creatinine of   1.78.  Also, alkaline phosphatase of 199.  The patient admitted for   diabetic ketoacidosis to ICU, which responded to aggressive IV fluid and   diabetic protocol of Glucommander.  The patient out of her DKA and the   patient has started on her insulin 54 units subcutaneous nightly with   NovoLog sliding scale.  During hospital stay, the patient  mentioned   condition associated with severe abdominal pain.  For that, CT of the   abdomen and pelvis was done which showed acute mild pancreatitis, but   also there was small bowel intussusception.  Regarding the   intussusception, Surgery consulted and Dr. Derrell Lolling recommended repeat the   CT scan.  Repeated CT scan did show resolution of the intussusception.   Gastroenterology consulted for evaluation of her acute pancreatitis.   Her lipid profile was not significantly elevated of triglyceride and   this could be followed by diet and exercise.  The Gastroenterology did   see the patient and they recommend MRCP to rule out any anatomical   pancreatic divisum, currently the MRCP did not clearly ruling out   pancreatic divisum.  Dr. Juanda Chance recommend to follow up the patient with   her within 4-6 weeks.  Also, suggesting there is edema and we think the   edema most probably secondary to the fluid the patient received.   Currently, the patient admitted.  She is completely pain-free and would   like to be discharged home.      Elevated troponin, the patient noticed to have elevated troponin,   cardiac enzyme of 0.1, but soon gradually coming to normal.  Her 2-D   echo did show ejection fraction of 60% to 65% and the patient had no   chest pain.  Her ejection fraction did show normal sinus rhythm.  Aortic   valve, there was no stenosis and aortic root was normal in size.   Ascending aorta was normal and there was normal central venous pressure.   The elevated troponin felt secondary to acute of her illness.  Also, the   patient noticed to have elevated lactic acidosis and this felt secondary   to the severity of her DKA.      PROCEDURES DONE:   1. Abdominal x-ray, normal bowel gas pattern and ultrasound of the       abdomen did show mild fullness of the right intrarenal collecting       system, probably tiny stone in the upper pole, change in the right       kidney may reflect right urinary  calculus.  CT abdomen and pelvis       with contrast consistent with mild acute pancreatitis.  Left upper       quadrant incidental small bowel intussusception.   2. CT of the abdomen and pelvis, repeat.  Interval resolution of small       bowel intussusception.   3. Bilateral striated nephrograms suggesting possible urinary tract       infection, correlate clinically.  MRCP was small bilateral pleural       effusion with mild prehepatic ascites, mesenteric edema,       subcutaneous edema, and pericholecystic fluid.  There may be       associated gallbladder wall thickening as well.  No gallstone       identified.  No biliary dilation or pancreatic duct dilation.  Mild       pre-portal edema is suggested.  The pancreatic duct is highly       indistinct, this secludes pancreatic divisum based on her       projectile course.      The patient's blood workup today, lipase of 66, sodium 138, potassium   3.9, chloride 109, CO2 of 24, glucose 137, BUN of 20, creatinine of   0.43, total bilirubin of 5, alkaline phosphatase 141.  AST 19, ALT 46,   amylase was 63.  We felt the patient is medically stable to be   discharged.  The patient needs to resume her Lantus of 54 units in   addition to NovoLog 1 unit for every 7 g carb eaten and 1 unit per every   50 mg up to a goal of 120 mg.  She will see Dr. Sharl Ma, her diabetic   endocrinologist.  The patient on the list of getting insulin pump.  Her   last hemoglobin A1c was more than 13.  The patient is noncompliance with   her diabetic regimen.  Education was done in hospital stay.  Currently,   we felt the patient is stable to be discharged, need to follow up with   Dr. Sharl Ma, and need to follow up with Dr. Juanda Chance.  Hind Bosie Helper, MD               HIE/MEDQ  D:  07/20/19

## 2010-11-08 NOTE — Progress Notes (Signed)
Summary: Triage  Phone Note Call from Patient Call back at Home Phone [phone]   Caller: Patient Call For: Dr. Juanda Chance Summary of Call: Saw Dr. Juanda Chance in hosp. 2wks ago and having Left side abd pain like she did in the hosp.  Initial call taken by: Karna Christmas,  August 01, 2010 8:53 AM  Follow-up for Phone Call        C/O left abdominal pain that started on Saturday. Pain got better then came back last night. Pain is above belly button. States "It is shooting pain that goes to my back." Diarrhea yesterday everytime she ate. No BM today. Pain increased again this AM. Ibuprofen not helping. Denies nausea, fever. Felt bloated a couple of days ago. Recommendations per Dr Juanda Chance: Lab: CBC, Amylase, Lipase, Cmet and KUB today. Appointment with Willette Cluster, RNP on 08/02/10 @ 2 PM. Vicodan 1-2 every 4-6 hours  by mouth as needed. Patient aware. Follow-up by: Jesse Fall RN,  August 01, 2010 10:04 AM  New Problems: ABDOMINAL PAIN, LEFT UPPER QUADRANT (ICD-789.02)   New Problems: ABDOMINAL PAIN, LEFT UPPER QUADRANT (ICD-789.02) New/Updated Medications: VICODIN 5-500 MG TABS (HYDROCODONE-ACETAMINOPHEN) Take 1-2 tablets every 4-6 hours as needed Prescriptions: VICODIN 5-500 MG TABS (HYDROCODONE-ACETAMINOPHEN) Take 1-2 tablets every 4-6 hours as needed  #30 x 0   Entered by:   Jesse Fall RN   Authorized by:   Hart Carwin MD   Signed by:   Jesse Fall RN on 08/01/2010   Method used:   Printed then faxed to ...       [facility] [phone]* (retail)       [address] [place], [place]  [address]       Ph: [phone]       Fax: [phone]   RxID:   [id number]   Appended Document: Triage    Clinical Lists Changes  Orders: Added new Test order of 1 View Abdomen (KUB) (74000TC) - Signed

## 2010-11-10 NOTE — Letter (Signed)
Summary: [facility]   Imported By: Sherian Rein 10/04/2010 09:54:57  _____________________________________________________________________  External Attachment:    Type:   Image     Comment:   External Document

## 2010-12-14 ENCOUNTER — Emergency Department: Payer: Self-pay | Admitting: Emergency Medicine

## 2010-12-20 LAB — GLUCOSE, CAPILLARY
Glucose-Capillary: 104 mg/dL — ABNORMAL HIGH (ref 70–99)
Glucose-Capillary: 122 mg/dL — ABNORMAL HIGH (ref 70–99)
Glucose-Capillary: 126 mg/dL — ABNORMAL HIGH (ref 70–99)
Glucose-Capillary: 172 mg/dL — ABNORMAL HIGH (ref 70–99)
Glucose-Capillary: 183 mg/dL — ABNORMAL HIGH (ref 70–99)
Glucose-Capillary: 194 mg/dL — ABNORMAL HIGH (ref 70–99)
Glucose-Capillary: 205 mg/dL — ABNORMAL HIGH (ref 70–99)
Glucose-Capillary: 320 mg/dL — ABNORMAL HIGH (ref 70–99)
Glucose-Capillary: 444 mg/dL — ABNORMAL HIGH (ref 70–99)
Glucose-Capillary: 69 mg/dL — ABNORMAL LOW (ref 70–99)
Glucose-Capillary: 78 mg/dL (ref 70–99)
Glucose-Capillary: 89 mg/dL (ref 70–99)
Glucose-Capillary: 90 mg/dL (ref 70–99)

## 2010-12-20 LAB — CBC
HCT: 33.6 % — ABNORMAL LOW (ref 36.0–46.0)
HCT: 38.5 % (ref 36.0–46.0)
Hemoglobin: 11.4 g/dL — ABNORMAL LOW (ref 12.0–15.0)
Hemoglobin: 13 g/dL (ref 12.0–15.0)
MCH: 28.8 pg (ref 26.0–34.0)
MCH: 28.8 pg (ref 26.0–34.0)
MCHC: 33.7 g/dL (ref 30.0–36.0)
MCHC: 33.8 g/dL (ref 30.0–36.0)
MCV: 85.6 fL (ref 78.0–100.0)
RDW: 13.4 % (ref 11.5–15.5)

## 2010-12-20 LAB — BASIC METABOLIC PANEL
BUN: 5 mg/dL — ABNORMAL LOW (ref 6–23)
CO2: 24 mEq/L (ref 19–32)
GFR calc non Af Amer: >60 mL/min (ref >60)
Glucose, Bld: 233 mg/dL — ABNORMAL HIGH (ref 70–99)
Potassium: 3.7 mEq/L (ref 3.5–5.1)

## 2010-12-20 LAB — URINALYSIS, ROUTINE W REFLEX MICROSCOPIC
Bilirubin Urine: NEGATIVE
Glucose, UA: >1000 mg/dL — AB
Hgb urine dipstick: NEGATIVE
Ketones, ur: 40 mg/dL — AB
Leukocytes, UA: NEGATIVE
Nitrite: NEGATIVE
Protein, ur: NEGATIVE mg/dL
Specific Gravity, Urine: 1.031 — ABNORMAL HIGH (ref 1.005–1.030)
Urobilinogen, UA: 0.2 mg/dL (ref 0.0–1.0)
pH: 5.5 (ref 5.0–8.0)

## 2010-12-20 LAB — COMPREHENSIVE METABOLIC PANEL
BUN: 14 mg/dL (ref 6–23)
CO2: 23 mEq/L (ref 19–32)
Calcium: 9.1 mg/dL (ref 8.4–10.5)
Creatinine, Ser: 1.04 mg/dL (ref 0.4–1.2)
GFR calc Af Amer: >60 mL/min (ref >60)
GFR calc non Af Amer: >60 mL/min (ref >60)
Glucose, Bld: 666 mg/dL (ref 70–99)

## 2010-12-20 LAB — SURGICAL PCR SCREEN
MRSA, PCR: NEGATIVE
Staphylococcus aureus: NEGATIVE

## 2010-12-20 LAB — KETONES, QUALITATIVE

## 2010-12-20 LAB — URINE MICROSCOPIC-ADD ON

## 2010-12-20 LAB — GLUCOSE, RANDOM: Glucose, Bld: 735 mg/dL (ref 70–99)

## 2010-12-20 LAB — LIPASE, BLOOD: Lipase: 29 U/L (ref 11–59)

## 2010-12-20 LAB — PREGNANCY, URINE: Preg Test, Ur: NEGATIVE

## 2010-12-22 LAB — COMPREHENSIVE METABOLIC PANEL
ALT: 16 U/L (ref 0–35)
ALT: 20 U/L (ref 0–35)
AST: 13 U/L (ref 0–37)
AST: 18 U/L (ref 0–37)
Albumin: 2.4 g/dL — ABNORMAL LOW (ref 3.5–5.2)
Albumin: 2.8 g/dL — ABNORMAL LOW (ref 3.5–5.2)
Alkaline Phosphatase: 141 U/L — ABNORMAL HIGH (ref 39–117)
BUN: 10 mg/dL (ref 6–23)
BUN: 2 mg/dL — ABNORMAL LOW (ref 6–23)
BUN: 5 mg/dL — ABNORMAL LOW (ref 6–23)
CO2: 6 mEq/L — CL (ref 19–32)
Calcium: 8.1 mg/dL — ABNORMAL LOW (ref 8.4–10.5)
Calcium: 8.2 mg/dL — ABNORMAL LOW (ref 8.4–10.5)
Chloride: 99 mEq/L (ref 96–112)
Creatinine, Ser: 0.89 mg/dL (ref 0.4–1.2)
Creatinine, Ser: 1.78 mg/dL — ABNORMAL HIGH (ref 0.4–1.2)
GFR calc Af Amer: 42 mL/min — ABNORMAL LOW (ref >60)
GFR calc Af Amer: >60 mL/min (ref >60)
GFR calc non Af Amer: 35 mL/min — ABNORMAL LOW (ref >60)
Glucose, Bld: 137 mg/dL — ABNORMAL HIGH (ref 70–99)
Glucose, Bld: 200 mg/dL — ABNORMAL HIGH (ref 70–99)
Glucose, Bld: 761 mg/dL (ref 70–99)
Potassium: 3.9 mEq/L (ref 3.5–5.1)
Sodium: 134 mEq/L — ABNORMAL LOW (ref 135–145)
Sodium: 135 mEq/L (ref 135–145)
Total Bilirubin: 2.8 mg/dL — ABNORMAL HIGH (ref 0.3–1.2)
Total Protein: 4.6 g/dL — ABNORMAL LOW (ref 6.0–8.3)
Total Protein: 4.6 g/dL — ABNORMAL LOW (ref 6.0–8.3)
Total Protein: 5.2 g/dL — ABNORMAL LOW (ref 6.0–8.3)

## 2010-12-22 LAB — CULTURE, BLOOD (ROUTINE X 2): Culture  Setup Time: 201110082044

## 2010-12-22 LAB — BASIC METABOLIC PANEL
BUN: 10 mg/dL (ref 6–23)
BUN: 12 mg/dL (ref 6–23)
BUN: 18 mg/dL (ref 6–23)
BUN: 5 mg/dL — ABNORMAL LOW (ref 6–23)
BUN: 7 mg/dL (ref 6–23)
BUN: 8 mg/dL (ref 6–23)
CO2: 19 mEq/L (ref 19–32)
CO2: 21 mEq/L (ref 19–32)
Calcium: 8 mg/dL — ABNORMAL LOW (ref 8.4–10.5)
Calcium: 8.3 mg/dL — ABNORMAL LOW (ref 8.4–10.5)
Calcium: 8.4 mg/dL (ref 8.4–10.5)
Chloride: 109 mEq/L (ref 96–112)
Chloride: 110 mEq/L (ref 96–112)
Chloride: 117 mEq/L — ABNORMAL HIGH (ref 96–112)
Chloride: 120 mEq/L — ABNORMAL HIGH (ref 96–112)
Chloride: 120 mEq/L — ABNORMAL HIGH (ref 96–112)
Creatinine, Ser: 0.6 mg/dL (ref 0.4–1.2)
Creatinine, Ser: 0.9 mg/dL (ref 0.4–1.2)
GFR calc Af Amer: >60 mL/min (ref >60)
GFR calc Af Amer: >60 mL/min (ref >60)
GFR calc non Af Amer: 40 mL/min — ABNORMAL LOW (ref >60)
GFR calc non Af Amer: >60 mL/min (ref >60)
GFR calc non Af Amer: >60 mL/min (ref >60)
GFR calc non Af Amer: >60 mL/min (ref >60)
Glucose, Bld: 121 mg/dL — ABNORMAL HIGH (ref 70–99)
Glucose, Bld: 183 mg/dL — ABNORMAL HIGH (ref 70–99)
Glucose, Bld: 378 mg/dL — ABNORMAL HIGH (ref 70–99)
Potassium: 3.2 mEq/L — ABNORMAL LOW (ref 3.5–5.1)
Potassium: 3.2 mEq/L — ABNORMAL LOW (ref 3.5–5.1)
Potassium: 3.3 mEq/L — ABNORMAL LOW (ref 3.5–5.1)
Potassium: 3.6 mEq/L (ref 3.5–5.1)
Potassium: 3.7 mEq/L (ref 3.5–5.1)
Potassium: 4.1 mEq/L (ref 3.5–5.1)
Sodium: 132 mEq/L — ABNORMAL LOW (ref 135–145)
Sodium: 138 mEq/L (ref 135–145)
Sodium: 143 mEq/L (ref 135–145)
Sodium: 145 mEq/L (ref 135–145)
Sodium: 147 mEq/L — ABNORMAL HIGH (ref 135–145)

## 2010-12-22 LAB — LACTIC ACID, PLASMA: Lactic Acid, Venous: 4.2 mmol/L — ABNORMAL HIGH (ref 0.5–2.2)

## 2010-12-22 LAB — DIFFERENTIAL
Basophils Absolute: 0 10*3/uL (ref 0.0–0.1)
Basophils Relative: 0 % (ref 0–1)
Eosinophils Absolute: 0 10*3/uL (ref 0.0–0.7)
Eosinophils Relative: 0 % (ref 0–5)
Neutrophils Relative %: 80 % — ABNORMAL HIGH (ref 43–77)

## 2010-12-22 LAB — LIPASE, BLOOD
Lipase: 175 U/L — ABNORMAL HIGH (ref 11–59)
Lipase: 395 U/L — ABNORMAL HIGH (ref 11–59)
Lipase: 66 U/L — ABNORMAL HIGH (ref 11–59)

## 2010-12-22 LAB — GLUCOSE, CAPILLARY
Glucose-Capillary: 121 mg/dL — ABNORMAL HIGH (ref 70–99)
Glucose-Capillary: 125 mg/dL — ABNORMAL HIGH (ref 70–99)
Glucose-Capillary: 130 mg/dL — ABNORMAL HIGH (ref 70–99)
Glucose-Capillary: 137 mg/dL — ABNORMAL HIGH (ref 70–99)
Glucose-Capillary: 140 mg/dL — ABNORMAL HIGH (ref 70–99)
Glucose-Capillary: 147 mg/dL — ABNORMAL HIGH (ref 70–99)
Glucose-Capillary: 153 mg/dL — ABNORMAL HIGH (ref 70–99)
Glucose-Capillary: 161 mg/dL — ABNORMAL HIGH (ref 70–99)
Glucose-Capillary: 174 mg/dL — ABNORMAL HIGH (ref 70–99)
Glucose-Capillary: 179 mg/dL — ABNORMAL HIGH (ref 70–99)
Glucose-Capillary: 181 mg/dL — ABNORMAL HIGH (ref 70–99)
Glucose-Capillary: 184 mg/dL — ABNORMAL HIGH (ref 70–99)
Glucose-Capillary: 188 mg/dL — ABNORMAL HIGH (ref 70–99)
Glucose-Capillary: 199 mg/dL — ABNORMAL HIGH (ref 70–99)
Glucose-Capillary: 205 mg/dL — ABNORMAL HIGH (ref 70–99)
Glucose-Capillary: 205 mg/dL — ABNORMAL HIGH (ref 70–99)
Glucose-Capillary: 209 mg/dL — ABNORMAL HIGH (ref 70–99)
Glucose-Capillary: 214 mg/dL — ABNORMAL HIGH (ref 70–99)
Glucose-Capillary: 221 mg/dL — ABNORMAL HIGH (ref 70–99)
Glucose-Capillary: 226 mg/dL — ABNORMAL HIGH (ref 70–99)
Glucose-Capillary: 232 mg/dL — ABNORMAL HIGH (ref 70–99)
Glucose-Capillary: 282 mg/dL — ABNORMAL HIGH (ref 70–99)
Glucose-Capillary: 464 mg/dL — ABNORMAL HIGH (ref 70–99)
Glucose-Capillary: 596 mg/dL (ref 70–99)
Glucose-Capillary: >600 mg/dL (ref 70–99)

## 2010-12-22 LAB — CARDIAC PANEL(CRET KIN+CKTOT+MB+TROPI)
CK, MB: 1.1 ng/mL (ref 0.3–4.0)
CK, MB: 1.1 ng/mL (ref 0.3–4.0)
CK, MB: 1.3 ng/mL (ref 0.3–4.0)
CK, MB: 1.5 ng/mL (ref 0.3–4.0)
Relative Index: INVALID (ref 0.0–2.5)
Relative Index: INVALID (ref 0.0–2.5)
Relative Index: INVALID (ref 0.0–2.5)
Total CK: 24 U/L (ref 7–177)
Total CK: 26 U/L (ref 7–177)
Total CK: 33 U/L (ref 7–177)
Troponin I: 0.07 ng/mL — ABNORMAL HIGH (ref 0.00–0.06)
Troponin I: 0.08 ng/mL — ABNORMAL HIGH (ref 0.00–0.06)
Troponin I: 0.1 ng/mL — ABNORMAL HIGH (ref 0.00–0.06)
Troponin I: 0.12 ng/mL — ABNORMAL HIGH (ref 0.00–0.06)
Troponin I: 0.14 ng/mL — ABNORMAL HIGH (ref 0.00–0.06)
Troponin I: 0.15 ng/mL — ABNORMAL HIGH (ref 0.00–0.06)

## 2010-12-22 LAB — URINALYSIS, ROUTINE W REFLEX MICROSCOPIC
Glucose, UA: >1000 mg/dL — AB
Leukocytes, UA: NEGATIVE
Protein, ur: NEGATIVE mg/dL
Specific Gravity, Urine: 1.03 (ref 1.005–1.030)
pH: 5.5 (ref 5.0–8.0)

## 2010-12-22 LAB — URINE CULTURE: Culture: NO GROWTH

## 2010-12-22 LAB — LIPID PANEL
LDL Cholesterol: 73 mg/dL (ref 0–99)
Total CHOL/HDL Ratio: 2.3 RATIO
VLDL: 66 mg/dL — ABNORMAL HIGH (ref 0–40)

## 2010-12-22 LAB — CBC
HCT: 29.3 % — ABNORMAL LOW (ref 36.0–46.0)
HCT: 30.2 % — ABNORMAL LOW (ref 36.0–46.0)
HCT: 40.2 % (ref 36.0–46.0)
Hemoglobin: 13.1 g/dL (ref 12.0–15.0)
MCH: 28.6 pg (ref 26.0–34.0)
MCH: 29.2 pg (ref 26.0–34.0)
MCHC: 33.4 g/dL (ref 30.0–36.0)
MCHC: 33.7 g/dL (ref 30.0–36.0)
MCHC: 34 g/dL (ref 30.0–36.0)
MCV: 85.6 fL (ref 78.0–100.0)
MCV: 85.9 fL (ref 78.0–100.0)
Platelets: 195 10*3/uL (ref 150–400)
Platelets: 286 10*3/uL (ref 150–400)
RBC: 3.88 MIL/uL (ref 3.87–5.11)
RBC: 4.49 MIL/uL (ref 3.87–5.11)
RDW: 13.9 % (ref 11.5–15.5)
RDW: 14.3 % (ref 11.5–15.5)
RDW: 14.5 % (ref 11.5–15.5)
WBC: 5.1 10*3/uL (ref 4.0–10.5)

## 2010-12-22 LAB — BLOOD GAS, VENOUS
Drawn by: 316901
O2 Saturation: 74.8 %
Patient temperature: 98.6

## 2010-12-22 LAB — TSH: TSH: 0.429 u[IU]/mL (ref 0.350–4.500)

## 2010-12-22 LAB — PHOSPHORUS: Phosphorus: 2 mg/dL — ABNORMAL LOW (ref 2.3–4.6)

## 2010-12-22 LAB — BLOOD GAS, ARTERIAL
Acid-base deficit: 14.8 mmol/L — ABNORMAL HIGH (ref 0.0–2.0)
Bicarbonate: 10.2 mEq/L — ABNORMAL LOW (ref 20.0–24.0)
O2 Saturation: 99.1 %
TCO2: 9.4 mmol/L (ref 0–100)
pO2, Arterial: 140 mmHg — ABNORMAL HIGH (ref 80.0–100.0)

## 2010-12-22 LAB — AMYLASE: Amylase: 338 U/L — ABNORMAL HIGH (ref 0–105)

## 2010-12-22 LAB — URINE MICROSCOPIC-ADD ON

## 2010-12-22 LAB — MRSA PCR SCREENING: MRSA by PCR: NEGATIVE

## 2010-12-22 LAB — MAGNESIUM: Magnesium: 1.9 mg/dL (ref 1.5–2.5)

## 2011-01-18 ENCOUNTER — Emergency Department (HOSPITAL_COMMUNITY): Payer: Medicaid Other

## 2011-01-18 ENCOUNTER — Inpatient Hospital Stay (HOSPITAL_COMMUNITY)
Admission: EM | Admit: 2011-01-18 | Discharge: 2011-01-19 | DRG: 781 | Disposition: A | Discharge status: Other Acute Inpatient Hospital | Payer: Medicaid Other | Attending: Internal Medicine | Admitting: Internal Medicine

## 2011-01-18 DIAGNOSIS — O24919 Unspecified diabetes mellitus in pregnancy, unspecified trimester: Secondary | ICD-10-CM | POA: Diagnosis present

## 2011-01-18 DIAGNOSIS — K3184 Gastroparesis: Secondary | ICD-10-CM | POA: Diagnosis present

## 2011-01-18 DIAGNOSIS — J45909 Unspecified asthma, uncomplicated: Secondary | ICD-10-CM | POA: Diagnosis present

## 2011-01-18 DIAGNOSIS — Z794 Long term (current) use of insulin: Secondary | ICD-10-CM

## 2011-01-18 DIAGNOSIS — O99891 Other specified diseases and conditions complicating pregnancy: Secondary | ICD-10-CM | POA: Diagnosis present

## 2011-01-18 DIAGNOSIS — N10 Acute tubulo-interstitial nephritis: Secondary | ICD-10-CM | POA: Diagnosis present

## 2011-01-18 DIAGNOSIS — O2390 Unspecified genitourinary tract infection in pregnancy, unspecified trimester: Principal | ICD-10-CM | POA: Diagnosis present

## 2011-01-18 DIAGNOSIS — Z9641 Presence of insulin pump (external) (internal): Secondary | ICD-10-CM

## 2011-01-18 DIAGNOSIS — E109 Type 1 diabetes mellitus without complications: Secondary | ICD-10-CM | POA: Diagnosis present

## 2011-01-18 LAB — DIFFERENTIAL
Basophils Relative: 0 % (ref 0–1)
Eosinophils Absolute: 0.2 10*3/uL (ref 0.0–0.7)
Lymphs Abs: 1.9 10*3/uL (ref 0.7–4.0)
Monocytes Absolute: 0.6 10*3/uL (ref 0.1–1.0)
Monocytes Relative: 7 % (ref 3–12)
Neutro Abs: 5.5 10*3/uL (ref 1.7–7.7)
Neutrophils Relative %: 67 % (ref 43–77)

## 2011-01-18 LAB — COMPREHENSIVE METABOLIC PANEL
BUN: 6 mg/dL (ref 6–23)
CO2: 23 mEq/L (ref 19–32)
Calcium: 8.6 mg/dL (ref 8.4–10.5)
Chloride: 105 mEq/L (ref 96–112)
Creatinine, Ser: 0.62 mg/dL (ref 0.4–1.2)
GFR calc non Af Amer: >60 mL/min (ref >60)
Total Bilirubin: 0.5 mg/dL (ref 0.3–1.2)

## 2011-01-18 LAB — GLUCOSE, CAPILLARY: Glucose-Capillary: 52 mg/dL — ABNORMAL LOW (ref 70–99)

## 2011-01-18 LAB — URINALYSIS, ROUTINE W REFLEX MICROSCOPIC
Glucose, UA: NEGATIVE mg/dL
Protein, ur: 30 mg/dL — AB
Specific Gravity, Urine: 1.017 (ref 1.005–1.030)
pH: 6 (ref 5.0–8.0)

## 2011-01-18 LAB — CBC
Hemoglobin: 11.1 g/dL — ABNORMAL LOW (ref 12.0–15.0)
MCH: 27.7 pg (ref 26.0–34.0)
MCHC: 34.3 g/dL (ref 30.0–36.0)
MCV: 80.8 fL (ref 78.0–100.0)
RBC: 4.01 MIL/uL (ref 3.87–5.11)

## 2011-01-18 LAB — URINE MICROSCOPIC-ADD ON

## 2011-01-18 LAB — LIPASE, BLOOD: Lipase: 20 U/L (ref 11–59)

## 2011-01-19 LAB — GLUCOSE, CAPILLARY: Glucose-Capillary: 151 mg/dL — ABNORMAL HIGH (ref 70–99)

## 2011-01-20 LAB — URINE CULTURE
Colony Count: >=100000
Culture  Setup Time: 201204120346

## 2011-01-21 NOTE — H&P (Signed)
NAMEMarland West  [name], [name]                ACCOUNT NO.:  [id number]  MEDICAL RECORD NO.:  [id number]           PATIENT TYPE:  E  LOCATION:  WLED                         FACILITY:  [facility]  PHYSICIAN:  Vania Rea, M.D. DATE OF BIRTH:  1986/08/22  DATE OF ADMISSION:  01/18/2011 DATE OF DISCHARGE:                             HISTORY & PHYSICAL   PRIMARY CARE PHYSICIAN:  Tonita Cong, MD  OBSTETRICIAN/GYNECOLOGIST:  Dr. Stefanie Libel, at [facility].  CHIEF COMPLAINT:  Back pain and nausea and vomiting since yesterday.  HISTORY OF PRESENT ILLNESS:  This is a 25 year old Caucasian lady reportedly [redacted] weeks pregnant and being seen at [facility] because of high-risk pregnancy.  She does have a history of diabetes type 1.  She was seen by her obstetrician 2 weeks ago for back pain, found to have a right-sided kidney stone treated with pain medications, and Macrobid as an outpatient and completed a course of Macrobid about 1 week ago, was much improved, but then yesterday had sudden and progressive onset of central back pain associated with nausea and vomiting.  The patient denies dysuria, hematuria, or frequency.  In the emergency room, the patient was evaluated for persistent kidney stone, eventually had an MRI, which revealed evidence of a right-sided pyelonephritis.  The patient was discussed with Dr. Stefanie Libel at [facility], who according to the emergency room physician felt that the patient was not at a stage where she could be admitted to Labor and Delivery, that her treatment is primarily medical, but if she desired to be treated at [facility], she would need to go to the emergency room to be evaluated from there. The patient elected not to go to the [facility] Emergency Room, and the hospitalist was called again.  Prior to evaluating the patient, the patient was discussed with Dr. Osborn Coho, on-call for OB/GYN at [facility] who indicated that the protocol was not to  admit obstetrical case as long as the current physician was comfortable treating the patient at present location.  Hospitalist Service therefore decided to admit this patient on to the internal medicine service for management of pyelonephritis.  It is of note the patient does not have high fever, has no history of leukocytosis.  However, her obstetrician did indicate that he felt she should get intravenous antibiotics.  PAST MEDICAL HISTORY: 1. Diabetes type 1. 2. Past history of pancreatitis. 3. History of small bowel intussusception in October 2011. 4. Atrial tachycardia. 5. She is status post cholecystectomy. 6. Gastroparesis. 7. Asthma.  MEDICATIONS: 1. Tylenol #3 every 6 hours as needed. 2. Prenatal vitamins. 3. Humalog via insulin pump. 4. Albuterol via inhaler 1-2 puffs every 6 hours as needed. 5. Reglan 10 mg 4 times daily as needed.  ALLERGIES:  CEPHALOSPORIN causes a rash.  The patient does not have a problem with Keflex, amoxicillin, or any other penicillins.  SOCIAL HISTORY:  She works as a Child psychotherapist.  There is no history of tobacco, alcohol, or illicit drug use.  Her code status is full code.  FAMILY HISTORY:  Significant for a brother with diabetes type 1; and both parents  with hypertension and diabetes type 2.  REVIEW OF SYSTEMS:  Other than noted above, unremarkable.  PHYSICAL EXAMINATION:  GENERAL:  A very pleasant young Caucasian lady reclining in the stretcher, not in acute distress at this time. VITAL SIGNS:  Her temperature is 98.4, pulse 98, respirations 16, blood pressure 159/91.  She is saturating 99% on room air. HEENT:  Her pupils are round and equal.  Mucous membranes pink and anicteric.  She is not dehydrated. NECK:  No cervical lymphadenopathy or thyromegaly.  No carotid bruit. CHEST:  Clear to auscultation bilaterally. CARDIOVASCULAR SYSTEM:  Regular rhythm.  No murmur. ABDOMEN:  Soft, nontender.  Abdomen is somewhat obese. EXTREMITIES:   Without edema.  She has no calf tenderness. CENTRAL NERVOUS SYSTEM:  Cranial nerves II-XII are grossly intact.  She has no focal neurologic deficit.  LABORATORY DATA:  Her white count is 8.2, hemoglobin 11.1, MCV 8.8, platelets 249.  She has a normal differential.  Sodium is 135, potassium 3.8, chloride 105, CO2 of 23, glucose 168, BUN 6, creatinine 0.6.  Her albumin is 2.7.  Her liver functions otherwise unremarkable.  Her lipase is 20.  Her urinalysis shows a white count of 21-50 with many bacteria. Urinalysis shows 30 proteins, moderate leukocyte esterase; otherwise unremarkable.  MRI of the abdomen was done which showed a gravid uterus. No fetal anomalies seen, anterior placenta, mild dilation of the mid-to- distal right ureter, likely secondary to extrinsic compression via a gravid uterus, normal appendix, no inflammatory changes at location. She did have mild right perirenal edema worrisome for pyelonephritis.  ASSESSMENT: 1. Acute pyelonephritis, extremely mild. 2. Urinary tract infection due to acute pyelonephritis. 3. Diabetes type 1, fair control. 4. Asthma. 5. Gastroparesis. 6. Normal pregnancy.  PLAN:  We will admit this lady for intravenous antibiotic therapy and discharge home when she is able to eat a full diet without difficulty.     Vania Rea, M.D.     LC/MEDQ  D:  01/18/2011  T:  01/18/2011  Job:  [id number]  cc:   Dr. Paulla Fore, M.D. Fax: [phone]  Electronically Signed by Vania Rea M.D. on 01/21/2011 02:50:20 AM

## 2011-01-21 NOTE — Discharge Summary (Signed)
  [name], West                ACCOUNT NO.:  [id number]  MEDICAL RECORD NO.:  [id number]           PATIENT TYPE:  I  LOCATION:  1318                         FACILITY:  [facility]  PHYSICIAN:  Vania Rea, M.D. DATE OF BIRTH:  Jun 08, 1986  DATE OF ADMISSION:  01/18/2011 DATE OF DISCHARGE:  01/19/2011                              DISCHARGE SUMMARY   TIME OF DISCHARGE:  12:30 a.m.  DISCHARGE DIAGNOSES:  Acute pyelonephritis with abdominal pain, [redacted] weeks pregnant, with brittle type diabetes, type 1.  DISCHARGE SUMMARY:  Please refer to the admission history and physical dictated a few hours ago.  In brief, shortly after arriving to the medical ward for treatment, the patient began complaining of severe colicky abdominal pain.  The patient indicated at this time she wished to be transferred to her obstetrician at [facility].  Dr. Orvan Falconer was contacted by the nursing staff and after discussion with Dr. Tonita Cong, the patient was accepted and transferred to [facility], via their antenatal service.  This was discussed with the patient and her parents and they are in agreement with the transfer.  The patient was transferred in stable condition.     Vania Rea, M.D.     LC/MEDQ  D:  01/19/2011  T:  01/19/2011  Job:  [id number]  cc:   Tonita Cong, M.D. Fax: [phone]  Dr. Stefanie Libel Obstetrics/Gynecology [facility]  Electronically Signed by Vania Rea M.D. on 01/21/2011 02:57:43 AM

## 2011-02-24 NOTE — Discharge Summary (Signed)
[name], [name]             ACCOUNT NO.:  [id number]   MEDICAL RECORD NO.:  [id number]          PATIENT TYPE:  INP   LOCATION:  1621                         FACILITY:  [facility]   PHYSICIAN:  Theone Stanley, MD   DATE OF BIRTH:  Jan 04, 1986   DATE OF ADMISSION:  07/15/2005  DATE OF DISCHARGE:                                 DISCHARGE SUMMARY   ADMISSION DIAGNOSES:  1.  Diabetic ketoacidosis.  2.  Nausea and vomiting secondary to diabetic ketoacidosis.  3.  History of diabetes type 1.  4.  Asthma.   DISCHARGE DIAGNOSES:  1.  Diabetic ketoacidosis.  2.  Nausea and vomiting secondary to diabetic ketoacidosis.  3.  History of diabetes type 1.  4.  Asthma.  5.  Hypokalemia, resolved.  6.  Hypertension.   CONSULTATIONS:  None.   PROCEDURES:  None.   HOSPITAL COURSE:  [name] is a very pleasant 26 year old female with a  history of type 1 diabetes for 15 years and asthma who was brought to the  emergency room with complaints of uncontrolled diabetes, nausea and  vomiting.  In the emergency room, it was found that she had evidence of DKA.  The patient was admitted to ICU; given IV fluids, and placed on a  Glucometer.  Her sugars slowly corrected.  In addition, she had several labs  obtained which showed hypokalemia, and this is felt secondary to correction  of her hyperglycemia.  Potassium was given to the patient, and this  resolved.  I spent quite some time discussing with the patient the  detriments of uncontrolled diabetes and consequences.  In addition, it was  noted that she had hypertension here in the hospital.  Several readings  showed the blood pressure in 140s.  Lisinopril was started here in the  hospital, and her pressures were well controlled with this.  I explained  both to the patient and the mother the reasoning behind giving blood  pressure medication.  However, I stated that Dr. Talmage West or her primary care  physician might need to reassess to see if she  will need to continue the  medication or possibly if stress had caused her pressures to be elevated as  she is only 25 years old.  Prior to discharge, she was given potassium since  her potassium was somewhat low.   PERTINENT DISCHARGE LABORATORIES:  Sodium 134, potassium of 3.3, chloride at  103, CO2 of 26, glucose at 105, BUN 3.0, creatinine of 0.6.  Hemoglobin A1C  was 13.2.   CONDITION ON DISCHARGE:  Stable.   DISCHARGE MEDICATIONS:  1.  Lantus 45 units subcu q.h.s.  2.  Protonix 40 mg 1 p.o. daily.  3.  Lisinopril 10 mg 1 p.o. daily.  4.  Albuterol at home dosage.  5.  Humalog insulin sliding-scale carbohydrate coverage insulin; for every      10 gm of carbohydrate 1 unit.  Correction factor:  Blood sugar minus 120      divided by 15.  6.  Potassium chloride 20 mEq 1 p.o. daily for 4 days.   FOLLOWUP:  Follow up  with Dr. Talmage West in 5-7 days.  She is to have a B-Met at  that time.  She is to follow up with her primary care physician as needed.      Theone Stanley, MD  Electronically Signed     AEJ/MEDQ  D:  07/19/2005  T:  07/19/2005  Job:  [id number]   cc:   [name], M.D.  Fax: [phone]

## 2011-02-24 NOTE — Discharge Summary (Signed)
[name], [name]                     ACCOUNT NO.:  [id number]   MEDICAL RECORD NO.:  [id number]                   PATIENT TYPE:  INP   LOCATION:  3702                                 FACILITY:  [facility]   PHYSICIAN:  Corinna L. Lendell Caprice, MD             DATE OF BIRTH:  02-12-1986   DATE OF ADMISSION:  03/10/2004  DATE OF DISCHARGE:  03/11/2004                                 DISCHARGE SUMMARY   DIAGNOSES:  1. Diabetic ketoacidosis.  2. Acute bronchitis.   DISCHARGE MEDICATIONS:  1. Azithromycin 250 mg p.o. every day for four more days.  2. Allegra 60 mg p.o. b.i.d. as needed for allergies/congestion.  3. Flonase two sprays per nostril every day.  4. She is to continue her Lantus 56 units subcutaneously every day and     sliding scale Humalog with meals.   CONDITION AT DISCHARGE:  Stable.   ACTIVITY:  Ad lib.   FOLLOW UP:  Follow up with Dr. Talmage Nap in 1-2 weeks.   HISTORY AND HOSPITAL COURSE:  [name] is a pleasant 25 year old type 1  diabetic who presented to the emergency room with high blood sugars.  She  had a sugar that she noted at home that was over 600.  She was feeling bad,  nauseated, and feeling weak.  She admitted to poor compliance with her  diabetes regimen.  Specifically, she was not checking her sugars very  regularly, and she skipped many of her sliding scale Humalog doses.  She  reported that her blood sugars often were in the 300 and 400s at home, even  prior to this episode.  She has seen Dr. Talmage Nap in the past but has not seen  her for a year.  She complained of some ear popping, nasal congestion, and  cough.   PHYSICAL EXAMINATION:  VITAL SIGNS:  She had normal vital signs.  HEENT:  Moist mucous membranes and a serous otitis.  LUNGS:  She had clear lung sounds and otherwise normal examination.   Initial blood work, in the emergency room, showed a pH of 7.315, pCO2 was  35, pO2 was 96.  Her CBC was unremarkable.  Her sodium was 129, potassium  4.3, chloride 99, bicarbonate 14, glucose 254, and she had moderate acetone  in her blood.  UA revealed greater than 1,000 glucose and greater than 80  ketones.  Negative nitrites, negative leukocyte esterase.   Her chest x-ray was negative.   The patient was given IV hydration, insulin, and admitted to the hospital.  Her repeat B-MET showed closing of her anion gap and her sugars remained in  the 100-200 range.  During her hospitalization, she reported that her sinus  congestion, cough, and sore throat had worsened.  It was felt that she may  have an acute bronchitis and as she was in DKA, we have elected to treat  this with azithromycin and supportive care.  I have encouraged her to be  more rigorous about her diabetes regimen and she will follow up with Dr.  Talmage Nap.                                                Corinna L. Lendell Caprice, MD    CLS/MEDQ  D:  03/11/2004  T:  03/13/2004  Job:  [id number]   cc:   [name], M.D.  [address], [place] [address]  Fax: [phone]

## 2011-02-24 NOTE — H&P (Signed)
[name], [name]                     ACCOUNT NO.:  [id number]   MEDICAL RECORD NO.:  [id number]                   PATIENT TYPE:  INP   LOCATION:  1831                                 FACILITY:  [facility]   PHYSICIAN:  Melissa L. Ladona Ridgel, MD               DATE OF BIRTH:  10/28/1985   DATE OF ADMISSION:  03/10/2004  DATE OF DISCHARGE:                                HISTORY & PHYSICAL   CHIEF COMPLAINT:  Nausea and vomiting, likely DKA.   PRIMARY CARE PHYSICIAN:  Oletha Cruel, MD.   HISTORY OF PRESENT ILLNESS:  Patient is a 25 year old white female who awoke  this morning with nausea and a very dry mouth.  She states that she tried to  drink some fluids but just felt even worse and worse.  She states that she  felt weak like she was going to pass out.  Patient states that she has been  congested for about two days.  She took some Advil and Sudafed.  Her only  other complaints are a headache.  She denied abdominal pain, vomiting,  dysuria, diarrhea, or constipation.  Her blood sugars over the course of the  last couple of months have been running into the 200s and 300s and do peak  in the 400s at times.  She does admit that she has been noncompliant with  her sliding-scale insulin and really just gives herself at night and does  not check her blood sugars.  Patient has a history of noncompliance.  She  has seen Dr. Lurene West as an outpatient.  Probably greater than a year ago was  her last visit.   Further review of systems reveals as above.  She denies melena or  hematochezia.  She denies any fevers or chills.  All other review of systems  are negative.   PAST MEDICAL HISTORY:  Diabetic.  Asthma.  Seasonal allergies.   PAST SURGICAL HISTORY:  She has none.   SOCIAL HISTORY:  She denies tobacco or alcohol.   FAMILY HISTORY:  Her mom and dad are both overweight without other medical  problems.  Her brother has diabetes.   ALLERGIES:  To some UNKNOWN PAIN MEDICATION which causes  her to be  delirious, otherwise she does not know the name nor is it listed in her  chart.   MEDICATIONS:  1. Lantus 56 units subcutaneous q.h.s.  2. Humalog sliding scale, which she did not share with me.   LABORATORY:  Urine pregnancy is negative.  Blood acetone is positive and  moderate.  Her initial sodium is 129, potassium 4.3, chloride 99, CO2 14,  BUN 16, creatinine 1.3, glucose 254.  Her gap was 16.  Her UA was positive  for greater than 1000 glucose and greater than 80 ketones.  Her ABG was  7.12/21/20/34.9.  O2 of 96.  Bicarb of 17.8.   In the emergency room, she received rehydration and no insulin.  A repeat  BMP three hours later showed a sodium of 136, potassium 4.0, chloride 107,  CO2 22, BUN 10, creatinine 0.8, glucose 115, and a gap of 7.  Her white  count is 5.2.  Hemoglobin 13.3, hematocrit 39.1, platelets 255.  She has no  neutrophilic shift.   CHEST X-RAY:  She had no acute disease.   PHYSICAL EXAMINATION:  VITAL SIGNS:  Temperature 96.8, blood pressure  113/55, pulse 89, respiratory rate 18, sats 99%.  GENERAL:  She is a well-developed and well-nourished white female who is  mildly obese.  Appears in no acute distress.  HEENT:  She is normocephalic and atraumatic.  Pupils are equal, round and  reactive to light.  Extraocular movements are intact.  Mucous membranes are  moist.  Her tympanic membranes are seroserous.  No obvious otitis media that  appears bacterial.  She does have serous otitis.  NECK:  There is no JVD.  No lymph nodes.  No carotid bruits.  LUNGS:  Clear to auscultation.  No rales, rhonchi or wheezes.  HEART:  Regular rate and rhythm.  Positive S1 and S2.  No S3 or S4.  No  murmurs, rubs or gallops.  BACK:  Numerous benign nevi.  ABDOMEN:  Soft, nontender, nondistended with positive bowel sounds.  No  hepatosplenomegaly.  EXTREMITIES:  Pulses 2+.  No edema.  NEUROLOGIC:  She is awake, alert and oriented.  Cranial nerves II-XII are  intact.   Power is 5/5.  Deep tendon reflexes are 2+.  Her sensation is  grossly intact.   ASSESSMENT/PLAN:  This is a 25 year old white female who is noncompliant  with her diabetes regimen.  Presents with a gapped basic metabolic panel.  Positive ketones but not acidotic.  This is likely representative of an  early diabetic ketoacidosis.  Assessment #1:  Endocrine:  Despite having glucoses of 254, the patient was  initially gapped.  She was rehydrated.  Repeat BMET shows that her gap had  slowed.  We therefore will be giving her p.m. Lantus and continuing her  sliding scale with Humalog.  We will continue rehydrating her and evaluate  her in the morning for possible discharge.  Assessment #2:  Recent exacerbation of her allergies has occurred; however,  her asthma is stable.  Her chest x-ray remained negative for any source  _________; however, she does have serous otitis media, so we will start her  on some Afrin x2 days and some Zyrtec.  She probably should be on some  Flonase at home after she completes the Afrin.  Assessment #3:  Cardiovascular:  There are no current issues.  Assessment #4:  Gastrointestinal:  Her repeat BMET revealed a _________  ;therefore, we will feed her.  Assessment #5:  Genitourinary:  Her urinalysis is negative for possible  infection.  Assessment #6:  Patient appears to be in early diabetic ketoacidosis  secondary to noncompliance.  We will attempt to provide outpatient support  at the time of discharge.                                                Melissa L. Ladona Ridgel, MD    MLT/MEDQ  D:  03/10/2004  T:  03/10/2004  Job:  [id number]

## 2011-02-24 NOTE — Discharge Summary (Signed)
   [name], [name]                     ACCOUNT NO.:  [id number]   MEDICAL RECORD NO.:  [id number]                   PATIENT TYPE:  INP   LOCATION:  6150                                 FACILITY:  [facility]   PHYSICIAN:  Hillery Aldo, M.D.                DATE OF BIRTH:  1986-08-11   DATE OF ADMISSION:  07/15/2002  DATE OF DISCHARGE:                                 DISCHARGE SUMMARY   ADDENDUM:  This is an addendum to dictation [phone], discharge summary on  [name]. This is Boyd Kerbs, MS IV, dictating for Dr.  Hillery Aldo. Copy to Dr. Nadara Mode.                                               Hillery Aldo, M.D.    CR/MEDQ  D:  07/18/2002  T:  07/21/2002  Job:  [id number]   cc:   Nadara Mode, M.D.  [facility] [address]  [place] [address]

## 2011-02-24 NOTE — H&P (Signed)
Cristina, West             ACCOUNT NO.:  [id number]   MEDICAL RECORD NO.:  [id number]           PATIENT TYPE:   LOCATION:                               FACILITY:  [facility]   PHYSICIAN:  Deirdre Peer. Polite, M.D. DATE OF BIRTH:  16-May-1986   DATE OF ADMISSION:  07/15/2005  DATE OF DISCHARGE:                                HISTORY & PHYSICAL   CHIEF COMPLAINT:  Nausea, vomiting.   HISTORY OF PRESENT ILLNESS:  A 25 year old female with known history of  diabetes type 1 for approximately 15 years and asthma who was brought to the  ED for complaints of uncontrolled diabetes and nausea and vomiting.  In the  ED the patient is evaluated, found to be hyperglycemic, has metabolic  acidosis on labs.  The patient had other screening labs, these showed  moderate acetone in blood, and low pH on ABG.  Admission is deemed necessary  for further evaluation and treatment of DKA.  Per the patient's report, she  just has not been taking good control of her diabetes.  Denies any  productive cough but does admit to some vague fever and chills.  Denies any  dysuria but does complain of abdominal pain.  She states she has been having  excessive nausea and vomiting.  The patient's mother is with her and does  admit that the patient's noncompliance with her diabetes and she wrote down  numbers from her Glucometer shows multiple sugars over the last 20 readings  that were greater than 300 to 400.  As stated admission is deemed necessary  for further evaluation and treatment of DKA.   PAST MEDICAL HISTORY:  Stated above.   MEDICATIONS ON ADMISSION:  1.  Humalog via sliding scale insulin.  2.  Lantus 45 units a day.  3.  Albuterol p.r.n.   SOCIAL HISTORY:  Negative for tobacco, alcohol, or drugs.   PAST SURGICAL HISTORY:  The patient had wisdom tooth extraction in May or  June 2006.   ALLERGIES:  Reports an allergy to some unknown pain medicine.   FAMILY HISTORY:  Brother with diabetes.   REVIEW  OF SYSTEMS:  As stated in the HPI.   PHYSICAL EXAMINATION:  GENERAL:  The patient is groggy in moderate distress  secondary to abdominal discomfort.  VITAL SIGNS:  Temp 97.5, BP 125/83, pulse 109, respiratory rate of 18,  sating 100%.  HEENT:  Significant for dry oral mucosa.  CHEST:  Clear to auscultation without rales or rhonchi.  CARDIOVASCULAR:  Regular.  No S3.  ABDOMEN:  Soft.  Vague generalized discomfort with minimal palpation.  No  distention.  Negative for rebound tenderness.  EXTREMITIES:  No edema.  Pulses 2+.  NEUROLOGIC:  Nonfocal.   DATA:  B-MET:  Sodium 135, potassium 5.6, chloride 100, carbon dioxide 13,  glucose 370, BUN 12, creatinine 1.2, with a gap of 22.  Blood acetone  moderate.   ASSESSMENT:  Diabetic ketoacidosis.   RECOMMENDATIONS:  1.  The patient be admitted to ICU.  2.  The patient will be treated in the typical fashion with IV insulin, IV  fluids.  3.  The patient will have serial electrolytes.  4.  We will replete potassium when K is less than 3.3.  5.  The patient will have other screening labs to rule out infectious      etiology for potential cause of her DKA, however, history suggests      noncompliance.      Deirdre Peer. Polite, M.D.  Electronically Signed     RDP/MEDQ  D:  07/15/2005  T:  07/15/2005  Job:  [id number]

## 2011-02-24 NOTE — Discharge Summary (Signed)
NAMEAIMY, [name]                     ACCOUNT NO.:  [id number]   MEDICAL RECORD NO.:  [id number]                   PATIENT TYPE:  INP   LOCATION:  6150                                 FACILITY:  [facility]   PHYSICIAN:  Hillery Aldo, M.D.                DATE OF BIRTH:  1986-09-12   DATE OF ADMISSION:  07/15/2002  DATE OF DISCHARGE:  07/18/2002                                 DISCHARGE SUMMARY   PRIMARY CARE PHYSICIAN:  Dr. Rickey Primus at [facility].   DISCHARGE DIAGNOSES:  1. Diabetic ketoacidosis, resolved.  2. Insulin-dependent diabetes mellitus with poor control.  3. Urinary tract infection.  4. Vaginal yeast infection, resolving.   DISCHARGE MEDICATIONS:  1. Lantus Insulin 60 units at bedtime to be adjusted slightly for     hypoglycemia to 55 units at bedtime.  2. Sliding scale insulin as follows: check blood glucose before meals and at     bedtime. If sugar is less than 60 drink 6 to 8 ounces of juice. If sugar     is 101 to 150 administer 3 units of Humalog. If sugar is 151 to 200     administer 4 units of Humalog. If sugar is 201 to 250 administer 8 units     of Humalog. If sugar is 251 to 300 administer 12 units of Humalog. If     sugar is 301 to 350 administer 16 units of Humalog.   DISCHARGE MEDICATIONS:  Penicillin-Vee-K 500 mg one pill three times daily  x5 days.   PROCEDURE:  None.   CONSULTATIONS:  Diabetes care team.   HISTORY OF PRESENT ILLNESS:  The patient is a 25 year old female who  presented to the emergency department on October 7, with complaints of  weakness, chills, a temperature at school of 99 degrees Fahrenheit,  headache, nausea with dry heaves and dizziness in the setting of a 10-year  history of insulin-dependent diabetes mellitus with very recent poor control  of her blood glucose.  She reported that she had awakened the morning of  admission with anorexia.  At school that day she had experienced weakness  and experienced  the above symptoms. She presented to the emergency  department. She stated that she had not been vigilant about checking and  controlling her blood glucose for the past few months, especially over the  past few days.  She reported that when she had checked previously on July 14, 2002, that night before dinner, her blood glucose was 600.  She took 30  units of Humalog and 42 units of Lantus (her usual dose of Lantus).  She  also stated that she had been improvising above her maximum on her sliding  scale insulin of 15 units of Humalog up to 30 units of Humalog.  In  addition, she stated that she had had gastroenteritis with nausea and  vomiting approximately two weeks ago and has had a vaginal  yeast infection  since October 5, for which she had been taking over-the-counter product. She  denied any upper respiratory tract infection symptoms, photophobia, neck  stiffness, skin lesions.  She states that she has had regular bowel  movements, and denied polyuria or polydipsia.   PHYSICAL EXAMINATION:  VITAL SIGNS:  Pulse 88, blood pressure 112/68,  temperature 98.4, respirations 18 per minute, oxygen saturations 98% on room  air.  Her weight was found to be 184 pounds, 83 kg.  LUNGS:  Examination was remarkable for deep breathing on respiratory  examination that was unlabored with good air movement.  Clear to  auscultation with no wheezes, rhonchi, or rales.  HEENT:  Significant for ketone breath with mildly dry mucous membranes.   LABORATORY DATA:  Admission CBG 480, admission UA; specific gravity 1.030,  pH 5.0, glucose greater than 1000, ketones greater than 80, negative  bilirubin, negative blood, negative protein, and negative nitrites.  ABG was  performed; 7.26/17.6/127/8.0/-16/98%. Serum ketones were found to be  positive and small serum sodium 131, serum potassium 5.1, serum chloride 94,  serum CO2 10, BUN 21, creatinine 1.4, glucose 553 with an anion gap of 27.  White blood cell  count 14.2, hemoglobin 15.5, hematocrit 47.3, platelets  315, ANC 11.5, MCV 84.3 with a left shift evident on differential with 12%  lymphocytes, 81% neutrophils, 4% monocytes, 2% eosinophils, 1% basophils.  Calcium was found to be at 9.8, phosphorus was found to be at 4.8.  Magnesium 2.1.  Urine pregnancy test was negative.   HOSPITAL COURSE:  1. Diabetic ketoacidosis.  She was given four 1 liter fluid boluses of     normal saline starting at 3:30 p.m. at the time that these were started,     her CBG was 480, bicarb was 10, anion gap was 27.  Insulin drip was     started at 8 units per hour, two hours later it was changed to 4 units     per hour due to rapidly decreasing blood glucose. At 11:30 p.m. IV fluids     were changed to D5 normal saline at 250 cc per hour. At this time     capillary blood glucose was 161, bicarb was 14, anion gap was 12.  She     was monitored overnight and through July 16, 2002, with hourly     capillary blood glucose checks and every other hour metabolic panel     checks with potassium supplementation as indicated at 10:30 p.m. on     October 8, she was given 10 units of NPH Insulin. At 11:30 p.m. the     insulin drip was discontinued. At this point, she was found to have     stable urine output as she had had throughout the course of her DKA     treatment, her bicarb was found to be 22, and her anion gap had closed.     She was monitored with frequent capillary blood glucoses after the     insulin drip was discontinued and was covered with sliding scale insulin     as well as her home dose of 42 units of Lantus every evening. Her blood     glucose was found to be somewhat labile in the 250 to 350 range,     decreasing significantly with administration of Humalog into the 150     range. Insulin insensitive sliding scale was adjusted accordingly and    Lantus dose was adjusted accordingly. At the  time of discharge, the     patient's capillary blood glucose  was 127.  She was put on a new insulin     regimen as detailed above and was advised to check her blood glucose     frequently before meals and at bedtime and stick strictly to her sliding     scale and to her prescribed diet as she appears to be quite brittle at     this time.  2. Diabetes mellitus poorly controlled. The patient met with the diabetes     care coordinator twice as well as a nutritionist while in the hospital.     She is eager to get an insulin pump at some point and she was advised     that in order to qualify for this, she would have to monitor her     capillary blood glucoses and show much better control than she has been     showing in the past couple of months.  She will hopefully be followed up     by Dr. Evlyn Kanner in [facility] for endocrinology needs and by Dr. Rickey Primus for     her primary care needs, and obtain better control over her disease. At     the time of discharge, her hemoglobin A1C was found to be 12.8.  3. Vaginal Candidiasis resolving. The patient received a course of Diflucan     while in the hospital x2 days as well as some Monistat cream vaginally.     She reported improvement of her symptoms and this was thought to be     likely due to her hyperglycemia and was resolved at the time of     discharge.  4. Urinary tract infection. On July 18, 2002, the urine culture which was     sent from the patient's admission urine sample was found to have greater     than 100,000 colony units of group B Strep agalactiae. The patient was     given a prescription for Penicillin-Vee-K as above to take for five days.   DISCHARGE LABORATORY DATA:  At the time of discharge; sodium 141, potassium  3.4, chloride 102, bicarb 28, BUN 10, creatinine 0.7, glucose 124, calcium  9.4.  The patient's signs and symptoms of diabetic ketoacidosis were greatly  improved. In addition, the patient was subjectively improved. No source of  infection was found apart from the above mentioned  vaginal Candidiasis and  urinary tract infection. The patient was encouraged to strictly  adhere to the prescribed basal insulin as well as sliding scale insulin and  frequent monitoring in order to qualify for an insulin pump which was  thought by her primary care team here in the hospital as well as the  diabetes care team to be an intervention that would benefit this patient  significantly.     Amparo Bristol, MS4                         Hillery Aldo, M.D.    CF/MEDQ  D:  07/18/2002  T:  07/21/2002  Job:  [id number]

## 2011-04-25 IMAGING — NM NM HEPATO W/GB/PHARM/[PERSON_NAME]
3 series · 13 of 13 positions shown · non-contrast
Comparison: Ultrasound 07/16/2010

CLINICAL DATA: History of abdominal pain.

NUCLEAR MEDICINE HEPATOBILIARY IMAGING WITH GALLBLADDER EF
TECHNIQUE: Sequential images of the abdomen were obtained [DATE]
minutes following intravenous administration of
radiopharmaceutical. After slow intravenous infusion of 1.55 uCg
Cholecystokinin, gallbladder ejection fraction was determined.
Radiopharmaceutical: 5 mCi Wc-33m Choletec

[he hepatobiliary · 3.43mm/px · 6 of 30 frames shown (1 of 3)]
[frame 3/30]
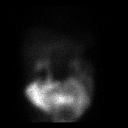
[frame 8/30]
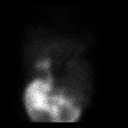
[frame 13/30]
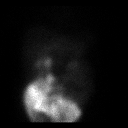
[frame 18/30]
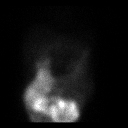
[frame 23/30]
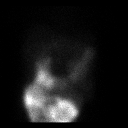
[frame 28/30]
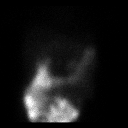

[he hepatobiliary · 1 of 1 slices shown (2 of 3)]
[im 1/1]
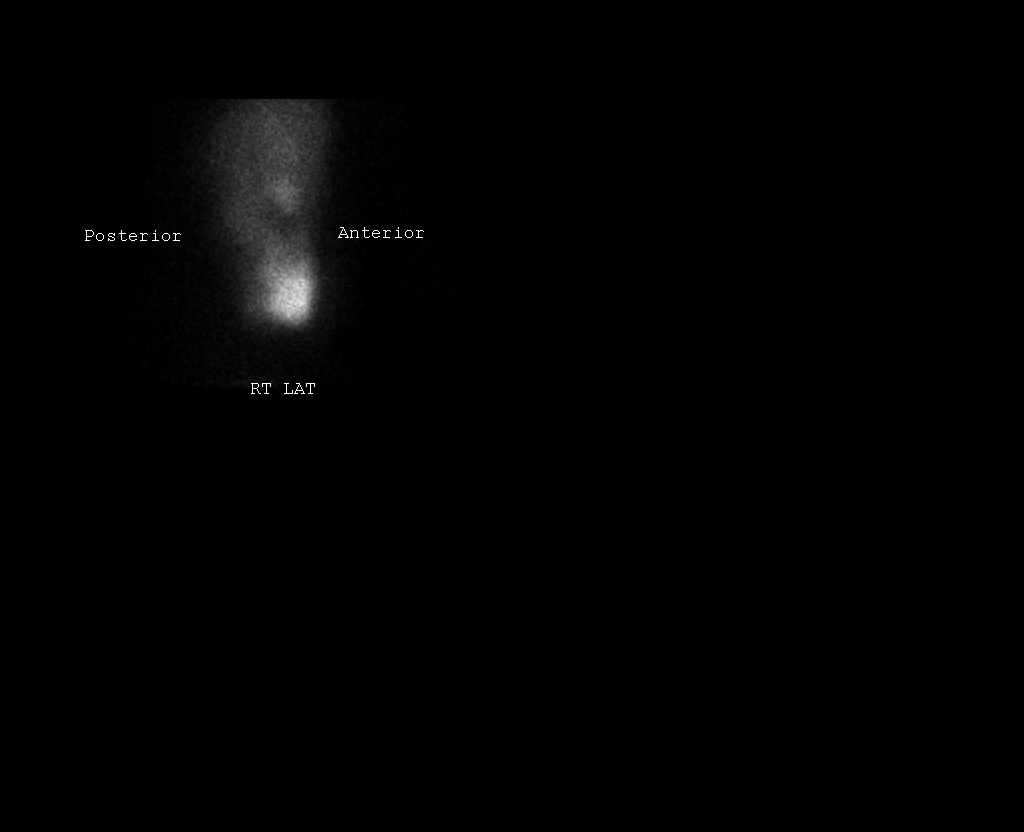

[he hepatobiliary · 3.43mm/px · 6 of 60 frames shown (3 of 3)]
[frame 6/60]
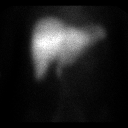
[frame 16/60]
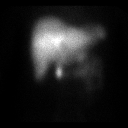
[frame 26/60]
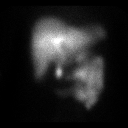
[frame 36/60]
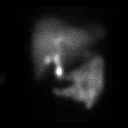
[frame 46/60]
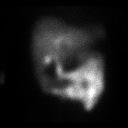
[frame 56/60]
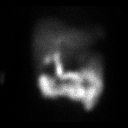

[13 of 13 positions shown; findings below may reference images not displayed]

FINDINGS: There is prompt visualization of hepatic activity. There
is prompt visualization of the common bile duct. Subsequently
intestinal activity was identified.

The gallbladder began to visualize at 31 minutes.

During CCK infusion the gallbladder contracted 15.3%.  This is
abnormally low. 30% or greater is normal range.

During CCK infusion the patient reported  experiencing no symptoms.
IMPRESSION: There is demonstration of patency of the common bile duct and the
cystic duct. There is no evidence of cholecystitis.

During CCK infusion the gallbladder contracted 15.3%.  This is
abnormally low. 30% or greater is normal range.

During CCK infusion the patient reported  experiencing no symptoms.

## 2011-07-11 LAB — COMPREHENSIVE METABOLIC PANEL
Alkaline Phosphatase: 74
BUN: 10
Chloride: 103
Glucose, Bld: 189 — ABNORMAL HIGH
Potassium: 3.7
Total Bilirubin: 0.5

## 2011-07-11 LAB — URINE MICROSCOPIC-ADD ON

## 2011-07-11 LAB — URINALYSIS, ROUTINE W REFLEX MICROSCOPIC
Glucose, UA: NEGATIVE
Leukocytes, UA: NEGATIVE
Protein, ur: NEGATIVE
Specific Gravity, Urine: 1.013
pH: 5.5

## 2011-07-11 LAB — DIFFERENTIAL
Basophils Absolute: 0
Basophils Relative: 1
Neutro Abs: 3.9
Neutrophils Relative %: 58

## 2011-07-11 LAB — CBC
HCT: 43
Hemoglobin: 14.4
MCV: 85.4
RDW: 13

## 2011-07-11 LAB — GLUCOSE, CAPILLARY

## 2011-07-11 LAB — LIPASE, BLOOD: Lipase: 17

## 2011-07-24 LAB — I-STAT 8, (EC8 V) (CONVERTED LAB)
Acid-base deficit: 3 — ABNORMAL HIGH
Chloride: 104
HCT: 48 — ABNORMAL HIGH
Operator id: 277751
Potassium: 3.8
pCO2, Ven: 28 — ABNORMAL LOW

## 2011-07-24 LAB — DIFFERENTIAL
Basophils Relative: 1
Lymphs Abs: 3
Monocytes Relative: 7
Neutro Abs: 4
Neutrophils Relative %: 45

## 2011-07-24 LAB — CBC
RBC: 5.26 — ABNORMAL HIGH
WBC: 8.7

## 2011-07-24 LAB — POCT I-STAT CREATININE: Creatinine, Ser: 0.8

## 2011-10-06 ENCOUNTER — Emergency Department (HOSPITAL_COMMUNITY)
Admission: EM | Admit: 2011-10-06 | Discharge: 2011-10-06 | Disposition: A | Discharge status: Home / Self Care | Payer: Medicaid Other | Attending: Emergency Medicine | Admitting: Emergency Medicine

## 2011-10-06 ENCOUNTER — Other Ambulatory Visit: Payer: Self-pay

## 2011-10-06 ENCOUNTER — Encounter: Payer: Self-pay | Admitting: Emergency Medicine

## 2011-10-06 ENCOUNTER — Emergency Department (HOSPITAL_COMMUNITY): Payer: Medicaid Other

## 2011-10-06 DIAGNOSIS — J45901 Unspecified asthma with (acute) exacerbation: Secondary | ICD-10-CM | POA: Insufficient documentation

## 2011-10-06 DIAGNOSIS — Z79899 Other long term (current) drug therapy: Secondary | ICD-10-CM | POA: Insufficient documentation

## 2011-10-06 DIAGNOSIS — Z794 Long term (current) use of insulin: Secondary | ICD-10-CM | POA: Insufficient documentation

## 2011-10-06 DIAGNOSIS — E119 Type 2 diabetes mellitus without complications: Secondary | ICD-10-CM | POA: Insufficient documentation

## 2011-10-06 HISTORY — DX: Tachycardia, unspecified: R00.0

## 2011-10-06 LAB — POCT PREGNANCY, URINE: Preg Test, Ur: NEGATIVE

## 2011-10-06 MED ORDER — METHYLPREDNISOLONE SODIUM SUCC 125 MG IJ SOLR
125.0000 mg | Freq: Once | INTRAMUSCULAR | Status: AC
  Administered 2011-10-06: 125 mg via INTRAVENOUS
  Filled 2011-10-06: qty 2

## 2011-10-06 MED ORDER — ALBUTEROL SULFATE (5 MG/ML) 0.5% IN NEBU
INHALATION_SOLUTION | RESPIRATORY_TRACT | Status: AC
  Filled 2011-10-06: qty 0.5

## 2011-10-06 MED ORDER — PREDNISONE 50 MG PO TABS: 50.0000 mg | ORAL_TABLET | Freq: Every day | ORAL | Status: DC

## 2011-10-06 MED ORDER — ALBUTEROL SULFATE (5 MG/ML) 0.5% IN NEBU
2.5000 mg | INHALATION_SOLUTION | Freq: Once | RESPIRATORY_TRACT | Status: AC
  Administered 2011-10-06: 2.5 mg via RESPIRATORY_TRACT

## 2011-10-06 MED ORDER — IPRATROPIUM BROMIDE 0.02 % IN SOLN: 500.0000 ug | Freq: Four times a day (QID) | RESPIRATORY_TRACT | Status: DC

## 2011-10-06 MED ORDER — ALBUTEROL SULFATE (5 MG/ML) 0.5% IN NEBU
2.5000 mg | INHALATION_SOLUTION | Freq: Once | RESPIRATORY_TRACT | Status: AC
  Administered 2011-10-06: 2.5 mg via RESPIRATORY_TRACT
  Filled 2011-10-06: qty 0.5

## 2011-10-06 MED ORDER — IPRATROPIUM BROMIDE 0.02 % IN SOLN
RESPIRATORY_TRACT | Status: AC
  Filled 2011-10-06: qty 2.5

## 2011-10-06 MED ORDER — IPRATROPIUM BROMIDE 0.02 % IN SOLN
0.5000 mg | Freq: Once | RESPIRATORY_TRACT | Status: AC
  Administered 2011-10-06: 0.5 mg via RESPIRATORY_TRACT
  Filled 2011-10-06: qty 2.5

## 2011-10-06 MED ORDER — IPRATROPIUM BROMIDE 0.02 % IN SOLN
0.5000 mg | Freq: Once | RESPIRATORY_TRACT | Status: AC
  Administered 2011-10-06: 0.5 mg via RESPIRATORY_TRACT

## 2011-10-06 NOTE — ED Notes (Deleted)
Pt has been feeling nauseas and has been vomiting since Saturday. Pt is also having abdominal pain that radiates from her back to her R abdomen

## 2011-10-06 NOTE — ED Notes (Signed)
Pt alert, nad, c/o cough, sob, onset was today, hx of asthma, resp even unlabored, skin pwd, moist npc noted

## 2011-10-06 NOTE — ED Notes (Signed)
ZOX:[id number]<AV> Expected date:<BR> Expected time:<BR> Means of arrival:<BR> Comments:<BR> Waiting for housekeeping to be cleaned

## 2011-10-06 NOTE — ED Notes (Signed)
Pt work of breathing increased, HR 150, acuity increased, moved to pt room

## 2011-10-06 NOTE — ED Provider Notes (Signed)
History     CSN: [id number]  Arrival date & time 10/06/11  0059   First MD Initiated Contact with Patient 10/06/11 0205      Chief Complaint  Patient presents with  . Asthma    (Consider location/radiation/quality/duration/timing/severity/associated sxs/prior treatment) Patient is a 25 y.o. female presenting with asthma and wheezing. The history is provided by the patient. No language interpreter was used.  Asthma This is a recurrent problem. The current episode started 3 to 5 hours ago. The problem occurs constantly. The problem has not changed since onset.Pertinent negatives include no chest pain, no abdominal pain and no headaches. The symptoms are aggravated by nothing. The symptoms are relieved by nothing. Treatments tried: albuterol. The treatment provided mild relief.  Wheezing  The current episode started yesterday. The onset was sudden. The problem occurs continuously. The problem has been unchanged. The problem is severe. The symptoms are relieved by nothing. The symptoms are aggravated by nothing. Associated symptoms include cough and wheezing. Pertinent negatives include no chest pain, no chest pressure, no orthopnea, no fever, no rhinorrhea and no stridor. There was no intake of a foreign body. She has not inhaled smoke recently. She has had intermittent steroid use. She has had no prior hospitalizations. She has had no prior ICU admissions. She has had no prior intubations. Her past medical history is significant for asthma. She has been behaving normally. Urine output has been normal. The last void occurred less than 6 hours ago. There were no sick contacts.  States this is a typical asthma attack.    Past Medical History  Diagnosis Date  . Asthma   . Diabetes mellitus   . Tachycardia     Past Surgical History  Procedure Date  . Cholecystectomy   . Cesarean section     No family history on file.  History  Substance Use Topics  . Smoking status: Not on file  .  Smokeless tobacco: Not on file  . Alcohol Use:     OB History    Grav Para Term Preterm Abortions TAB SAB Ect Mult Living                  Review of Systems  Constitutional: Negative for fever and activity change.  HENT: Negative for facial swelling and rhinorrhea.   Eyes: Negative for discharge.  Respiratory: Positive for cough and wheezing. Negative for apnea and stridor.   Cardiovascular: Negative for chest pain and orthopnea.  Gastrointestinal: Negative for abdominal pain and abdominal distention.  Genitourinary: Negative for difficulty urinating.  Musculoskeletal: Positive for arthralgias.  Neurological: Negative for headaches.  Hematological: Negative.   Psychiatric/Behavioral: Negative.     Allergies  Ceftin  Home Medications   Current Outpatient Rx  Name Route Sig Dispense Refill  . ALBUTEROL SULFATE HFA 108 (90 BASE) MCG/ACT IN AERS Inhalation Inhale 2 puffs into the lungs every 6 (six) hours as needed.      . ALBUTEROL SULFATE (5 MG/ML) 0.5% IN NEBU Nebulization Take 2.5 mg by nebulization every 6 (six) hours as needed. Shortness of breath or wheezing     . INSULIN LISPRO (HUMAN) 100 UNIT/ML [place] SOLN Subcutaneous Inject into the skin 3 (three) times daily before meals. Insulin pump : 27 base units 1 unit for every five grams she eats.    Marland Kitchen METOCLOPRAMIDE HCL 10 MG PO TABS Oral Take 10 mg by mouth 4 (four) times daily.     Marland Kitchen PRENATAL MULTIVITAMIN CH Oral Take 1 tablet by mouth  daily.        BP 135/90  Pulse 140  Temp(Src) 98 F (36.7 C) (Oral)  Resp 24  Wt 186 lb (84.369 kg)  SpO2 96%  LMP 07/29/2011  Physical Exam  Constitutional: She is oriented to person, place, and time. She appears well-developed and well-nourished. No distress.  HENT:  Head: Normocephalic and atraumatic.  Eyes: EOM are normal. Pupils are equal, round, and reactive to light.  Neck: Normal range of motion. Neck supple.  Cardiovascular: Tachycardia present.   Pulmonary/Chest: Effort  normal. She has wheezes. She has no rales. She exhibits no tenderness.  Abdominal: Soft. Bowel sounds are normal. There is no tenderness. There is no rebound and no guarding.  Musculoskeletal: Normal range of motion.  Neurological: She is alert and oriented to person, place, and time.  Skin: Skin is warm and dry.  Psychiatric: Thought content normal.    ED Course  Procedures (including critical care time)   Labs Reviewed  POCT PREGNANCY, URINE   Dg Chest 2 View  10/06/2011  *RADIOLOGY REPORT*  Clinical Data: Cough and shortness of breath for 3 days.  CHEST - 2 VIEW  Comparison: Chest radiograph performed 05/08/2007  Findings: The lungs are well-aerated and clear.  There is no evidence of focal opacification, pleural effusion or pneumothorax.  The heart is normal in size; the mediastinal contour is within normal limits.  No acute osseous abnormalities are seen.  Clips are noted within the right upper quadrant, reflecting prior cholecystectomy.  IMPRESSION: No acute cardiopulmonary process seen.  Original Report Authenticated By: Tonia Ghent, M.D.     No diagnosis found.    MDM   Date: 10/06/2011  Rate: 135  Rhythm: sinus tachycardia  QRS Axis: normal  Intervals: normal  ST/T Wave abnormalities: normal  Conduction Disutrbances:none  Narrative Interpretation:   Old EKG Reviewed: unchanged    PERC negative     Willy Vorce K Casee Knepp-Rasch, MD 10/06/11 [phone]

## 2011-10-09 IMAGING — US US RENAL
1 series · 14 of 25 positions shown · non-contrast
Comparison: CT abdomen pelvis of 08/11/2010

CLINICAL DATA: Right-sided pain, history of kidney stones, the
patient is 16 weeks pregnant

RENAL/URINARY TRACT ULTRASOUND COMPLETE

[Series 1: us renal · 0.28mm/px · 14 of 33 slices shown]
[im 1/33]
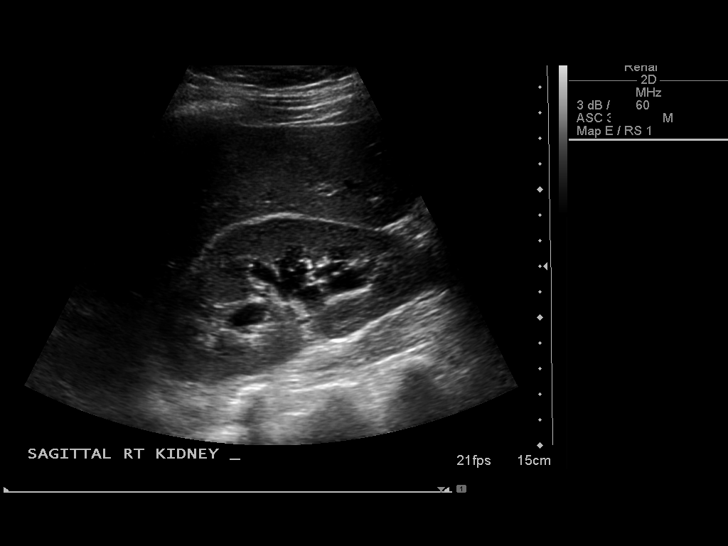
[im 3/33]
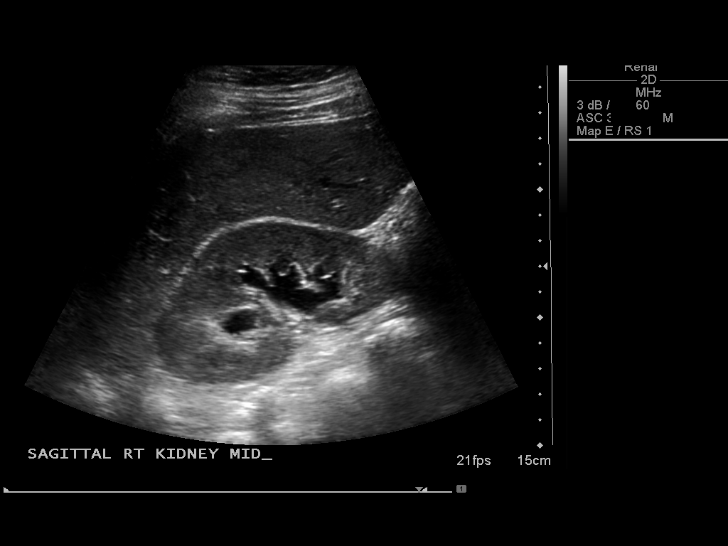
[im 6/33]
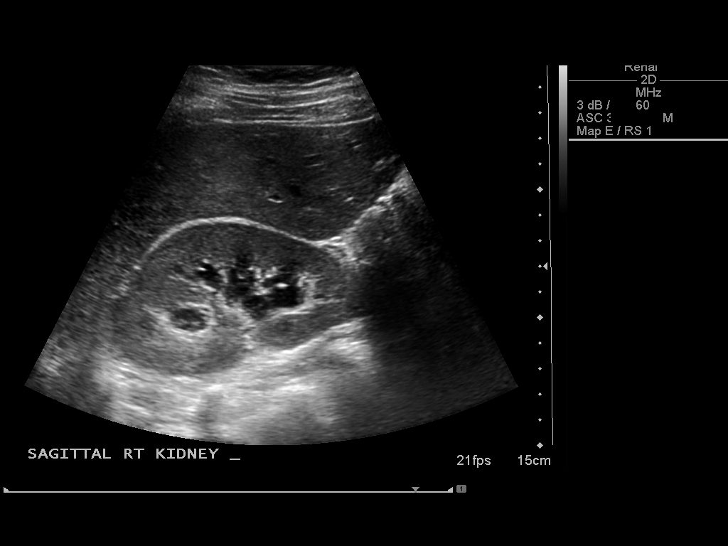
[im 9/33]
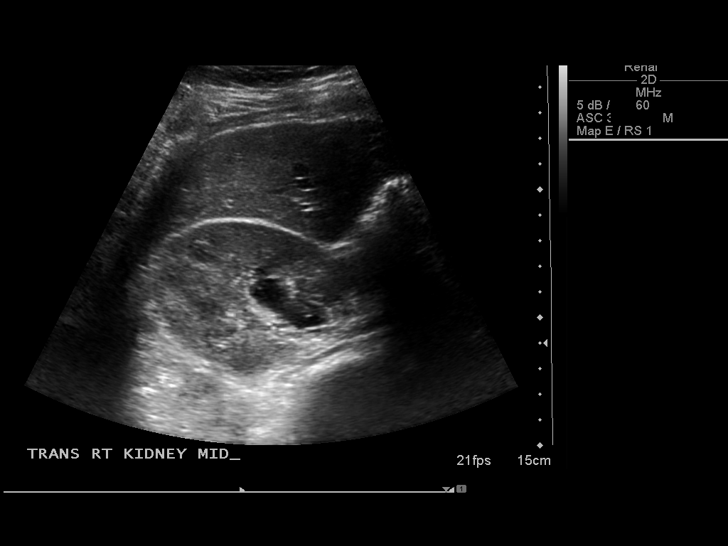
[im 11/33]
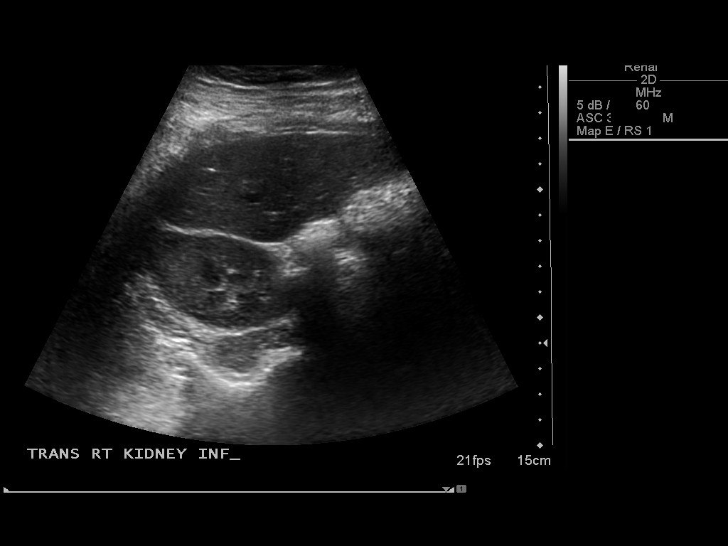
[im 13/33]
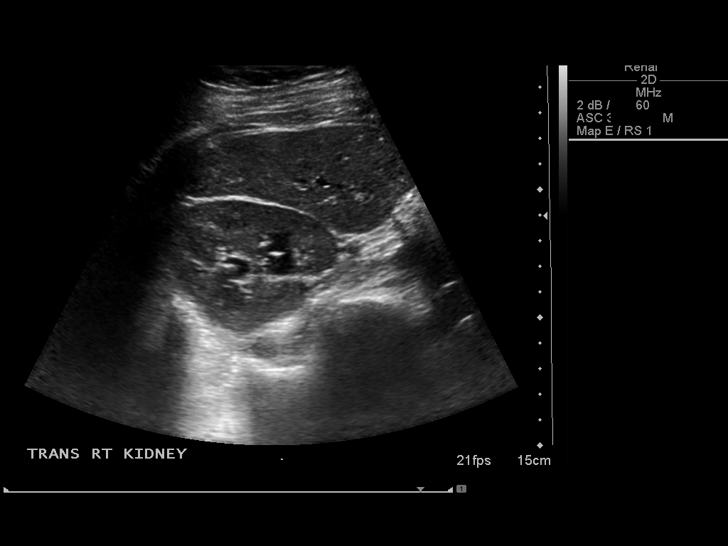
[im 15/33]
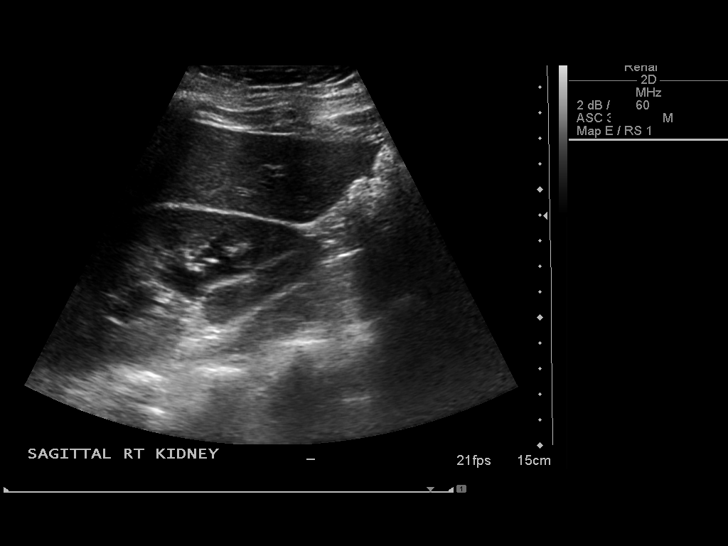
[im 18/33]
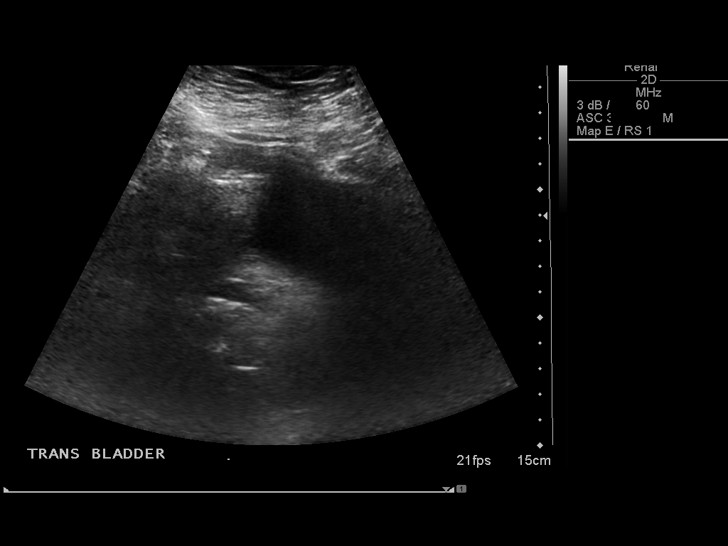
[im 21/33]
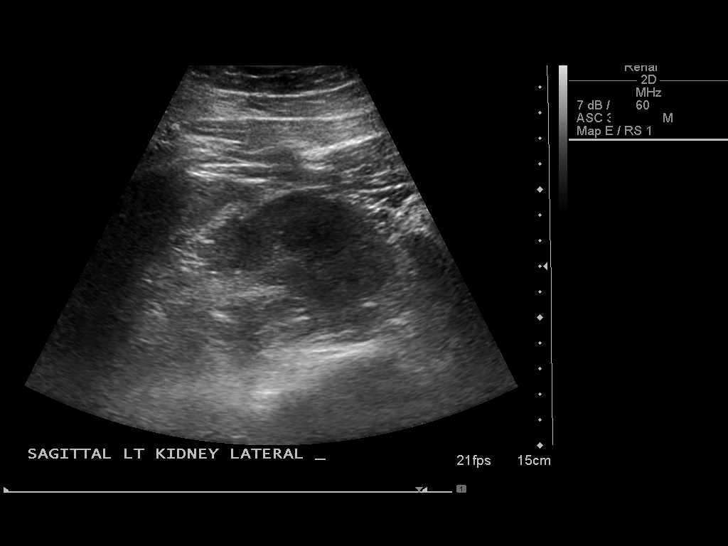
[im 22/33]
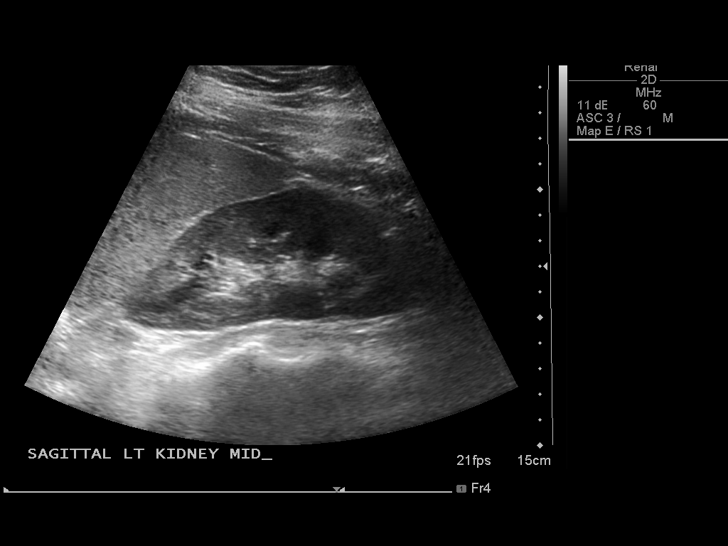
[im 25/33]
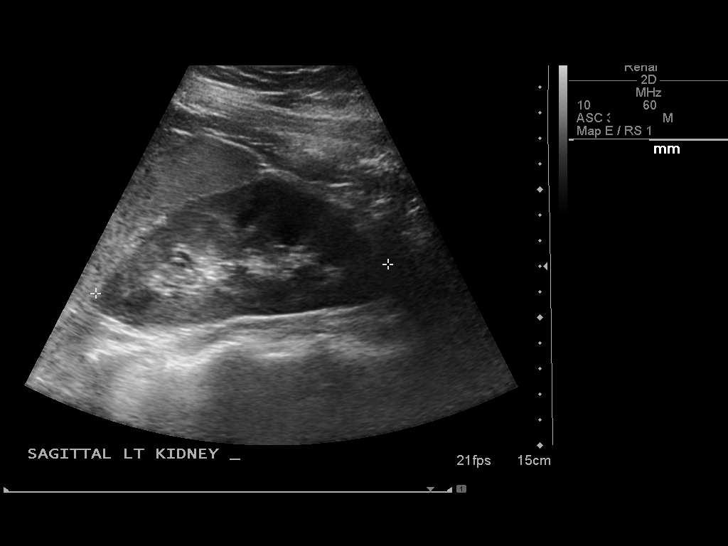
[im 27/33]
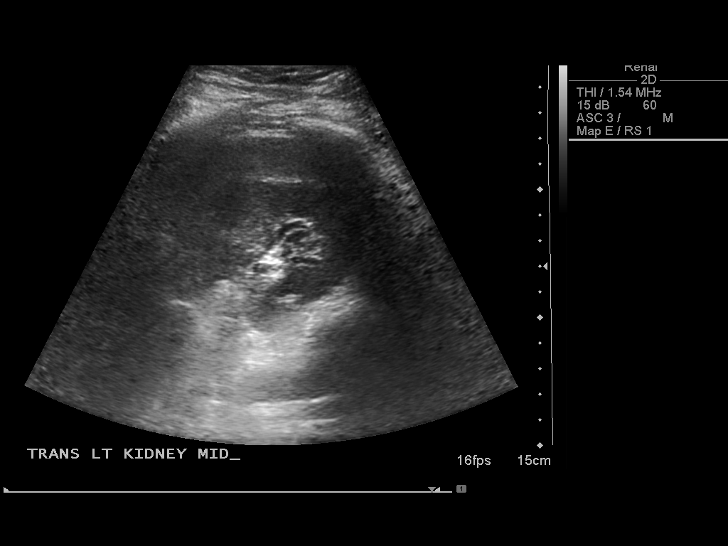
[im 30/33]
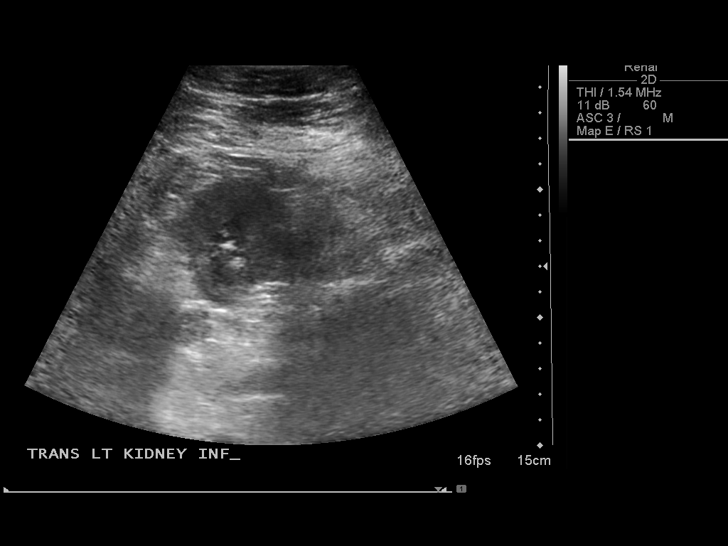
[im 33/33]
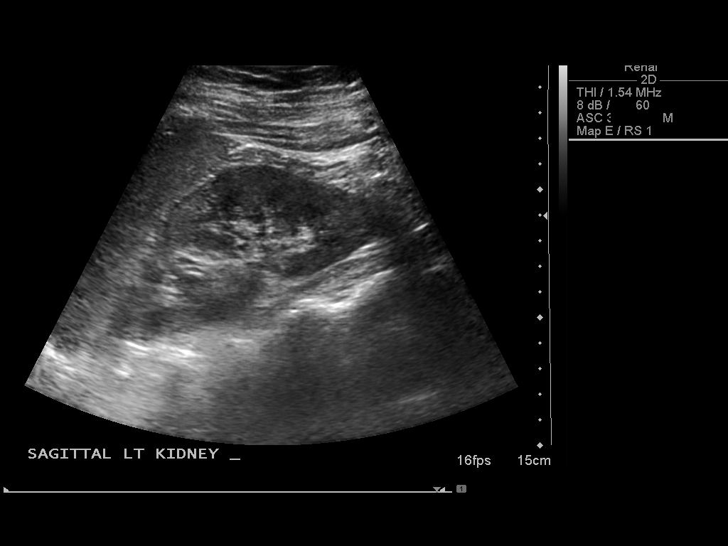

[14 of 25 positions shown; findings below may reference images not displayed]

FINDINGS: Right Kidney:  The right kidney measures 11.3 cm sagittally.  There
is some fullness of the right renal pelvocaliceal system.  An
echogenic focus is noted in the mid inferior collecting system of 6
mm consistent with a right renal calculus.  This fullness could be
due to the patient's known pregnancy, although low grade
obstruction from calculus is difficult to exclude.

Left Kidney:  No hydronephrosis. The left kidney measures 11.5 cm
sagittally.

Bladder:  The patient voided prior to the study and therefore the
urinary bladder cannot be visualized.
IMPRESSION: 1.  Fullness of the right pelvocaliceal system.  Possibly due to
the patient's known pregnancy but cannot exclude  low grade
obstruction by calculus.
2.  6 mm calculus in the mid inferior right kidney.

## 2012-02-29 ENCOUNTER — Ambulatory Visit: Payer: Medicaid Other | Admitting: Orthopedic Surgery

## 2012-03-22 ENCOUNTER — Emergency Department (HOSPITAL_COMMUNITY)
Admission: EM | Admit: 2012-03-22 | Discharge: 2012-03-22 | Disposition: A | Discharge status: Home / Self Care | Payer: 59 | Source: Home / Self Care | Attending: Family Medicine | Admitting: Family Medicine

## 2012-03-22 ENCOUNTER — Other Ambulatory Visit: Payer: Self-pay | Admitting: Pediatrics

## 2012-03-22 ENCOUNTER — Encounter (HOSPITAL_COMMUNITY): Payer: Self-pay | Admitting: Emergency Medicine

## 2012-03-22 DIAGNOSIS — T148XXA Other injury of unspecified body region, initial encounter: Secondary | ICD-10-CM

## 2012-03-22 DIAGNOSIS — E11628 Type 2 diabetes mellitus with other skin complications: Secondary | ICD-10-CM

## 2012-03-22 DIAGNOSIS — E1169 Type 2 diabetes mellitus with other specified complication: Secondary | ICD-10-CM

## 2012-03-22 DIAGNOSIS — L089 Local infection of the skin and subcutaneous tissue, unspecified: Secondary | ICD-10-CM

## 2012-03-22 MED ORDER — CIPROFLOXACIN HCL 500 MG PO TABS: 500.0000 mg | ORAL_TABLET | Freq: Two times a day (BID) | ORAL | Status: AC

## 2012-03-22 MED ORDER — DOXYCYCLINE HYCLATE 100 MG PO CAPS: 100.0000 mg | ORAL_CAPSULE | Freq: Two times a day (BID) | ORAL | Status: AC

## 2012-03-22 NOTE — Progress Notes (Signed)
Patient had pedicure x3 weeks and foot got cut. Lady at salon put something on foot to stop bleeding, and put her foot back into water. Patient was complaining of right foot pain yesterday. Looked at foot, noticed wound infected. When squeezed, pus came out of site. Wound cultured.

## 2012-03-22 NOTE — ED Notes (Signed)
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

## 2012-03-22 NOTE — ED Provider Notes (Signed)
History     CSN: [id number]  Arrival date & time 03/22/12  1123   First MD Initiated Contact with Patient 03/22/12 1240      Chief Complaint  Patient presents with  . Foot Pain  . Nail Problem    (Consider location/radiation/quality/duration/timing/severity/associated sxs/prior treatment) Patient is a 26 y.o. female presenting with lower extremity pain. The history is provided by the patient. No language interpreter was used.  Foot Pain This is a new problem. Episode onset: pedicure 3 weeks ago  The problem occurs constantly. The problem has been gradually worsening. Nothing aggravates the symptoms. Nothing relieves the symptoms. She has tried nothing for the symptoms.  Pt's foot was cut while getting a pedicure.   Pt reports now has an oozing sore.  Pt had a culture sent by her MD.   Past Medical History  Diagnosis Date  . Asthma   . Diabetes mellitus   . Tachycardia   . Tachycardia     baseline tachycardia     Past Surgical History  Procedure Date  . Cholecystectomy   . Cesarean section     No family history on file.  History  Substance Use Topics  . Smoking status: Never Smoker   . Smokeless tobacco: Not on file  . Alcohol Use: No    OB History    Grav Para Term Preterm Abortions TAB SAB Ect Mult Living                  Review of Systems  Musculoskeletal: Positive for joint swelling.  Skin: Positive for color change.  All other systems reviewed and are negative.    Allergies  Ceftin  Home Medications   Current Outpatient Rx  Name Route Sig Dispense Refill  . ALBUTEROL SULFATE HFA 108 (90 BASE) MCG/ACT IN AERS Inhalation Inhale 2 puffs into the lungs every 6 (six) hours as needed.      . ALBUTEROL SULFATE (5 MG/ML) 0.5% IN NEBU Nebulization Take 2.5 mg by nebulization every 6 (six) hours as needed. Shortness of breath or wheezing     . CIPROFLOXACIN HCL 500 MG PO TABS Oral Take 1 tablet (500 mg total) by mouth 2 (two) times daily. 20 tablet 0  .  DOXYCYCLINE HYCLATE 100 MG PO CAPS Oral Take 1 capsule (100 mg total) by mouth 2 (two) times daily. 20 capsule 0  . INSULIN LISPRO (HUMAN) 100 UNIT/ML [place] SOLN Subcutaneous Inject into the skin 3 (three) times daily before meals. Insulin pump : 27 base units 1 unit for every five grams she eats.    . IPRATROPIUM BROMIDE 0.02 % IN SOLN Nebulization Take 2.5 mLs (500 mcg total) by nebulization 4 (four) times daily. 25 mL 0  . METOCLOPRAMIDE HCL 10 MG PO TABS Oral Take 10 mg by mouth 4 (four) times daily.     Marland Kitchen PREDNISONE 50 MG PO TABS Oral Take 1 tablet (50 mg total) by mouth daily. 5 tablet 0  . PRENATAL MULTIVITAMIN CH Oral Take 1 tablet by mouth daily.        BP 136/92  Pulse 105  Temp 98.3 F (36.8 C) (Oral)  Resp 14  SpO2 97%  Physical Exam  Nursing note and vitals reviewed. Constitutional: She appears well-developed and well-nourished.  Musculoskeletal: She exhibits tenderness.       Open sore,  Yellow around,   Erythema surrounding,  Crack and open area between 1st toe and foot  Neurological: She is alert.  Skin: Skin is warm.  Psychiatric: She has a normal mood and affect.    ED Course  Procedures (including critical care time)  Labs Reviewed - No data to display No results found.   No diagnosis found.    MDM  I am going to cover for mrsa.    I will have pt recheck here in 2 days        Elson Areas, [place] 03/22/12 1245

## 2012-03-22 NOTE — Discharge Instructions (Signed)
Skin Infections A skin infection usually develops as a result of disruption of the skin barrier.  CAUSES  A skin infection might occur following:  Trauma or an injury to the skin such as a cut or insect sting.   Inflammation (as in eczema).   Breaks in the skin between the toes (as in athlete's foot).   Swelling (edema).  SYMPTOMS  The legs are the most common site affected. Usually there is:  Redness.   Swelling.   Pain.   There may be red streaks in the area of the infection.  TREATMENT   Minor skin infections may be treated with topical antibiotics, but if the skin infection is severe, hospital care and intravenous (IV) antibiotic treatment may be needed.   Most often skin infections can be treated with oral antibiotic medicine as well as proper rest and elevation of the affected area until the infection improves.   If you are prescribed oral antibiotics, it is important to take them as directed and to take all the pills even if you feel better before you have finished all of the medicine.   You may apply warm compresses to the area for 20-30 minutes 4 times daily.  You might need a tetanus shot now if:  You have no idea when you had the last one.   You have never had a tetanus shot before.   Your wound had dirt in it.  If you need a tetanus shot and you decide not to get one, there is a rare chance of getting tetanus. Sickness from tetanus can be serious. If you get a tetanus shot, your arm may swell and become red and warm at the shot site. This is common and not a problem. SEEK MEDICAL CARE IF:  The pain and swelling from your infection do not improve within 2 days.  SEEK IMMEDIATE MEDICAL CARE IF:  You develop a fever, chills, or other serious problems.  Document Released: 11/02/2004 Document Revised: 09/14/2011 Document Reviewed: 09/14/2008 [employer] Patient Information 2012 [employer]. 

## 2012-03-23 NOTE — ED Provider Notes (Signed)
Medical screening examination/treatment/procedure(s) were performed by resident physician or non-physician practitioner and as supervising physician I was immediately available for consultation/collaboration.   Elyce Zollinger DOUGLAS MD.    Ermagene Saidi D Aviance [place], MD 03/23/12 1110 

## 2012-03-24 ENCOUNTER — Emergency Department (HOSPITAL_COMMUNITY)
Admission: EM | Admit: 2012-03-24 | Discharge: 2012-03-24 | Disposition: A | Discharge status: Home / Self Care | Payer: 59 | Source: Home / Self Care | Attending: Emergency Medicine | Admitting: Emergency Medicine

## 2012-03-24 ENCOUNTER — Encounter (HOSPITAL_COMMUNITY): Payer: Self-pay | Admitting: *Deleted

## 2012-03-24 DIAGNOSIS — L089 Local infection of the skin and subcutaneous tissue, unspecified: Secondary | ICD-10-CM

## 2012-03-24 DIAGNOSIS — T148XXA Other injury of unspecified body region, initial encounter: Secondary | ICD-10-CM

## 2012-03-24 LAB — CULTURE, ROUTINE-ABSCESS: Gram Stain: NONE SEEN

## 2012-03-24 NOTE — ED Provider Notes (Signed)
History     CSN: [id number]  Arrival date & time 03/24/12  1141   First MD Initiated Contact with Patient 03/24/12 1234      Chief Complaint  Patient presents with  . Follow-up    (Consider location/radiation/quality/duration/timing/severity/associated sxs/prior treatment) Patient is a 26 y.o. female presenting with lower extremity pain. The history is provided by the patient. No language interpreter was used.  Foot Pain This is a recurrent problem.   patient is here for recheck of right foot she was seen 2 days ago by me started on doxycycline and Cipro for foot infection. Patient reports she thinks the area is improving no fever no chills I reviewed the culture culture grew group B strep  patient works in a pediatrician's office and the culture was done at the pediatrician's office suspect skin contaminant. The infection seems to be clearing with Cipro and doxycycline   Past Medical History  Diagnosis Date  . Asthma   . Diabetes mellitus   . Tachycardia   . Tachycardia     baseline tachycardia     Past Surgical History  Procedure Date  . Cholecystectomy   . Cesarean section     History reviewed. No pertinent family history.  History  Substance Use Topics  . Smoking status: Never Smoker   . Smokeless tobacco: Not on file  . Alcohol Use: No    OB History    Grav Para Term Preterm Abortions TAB SAB Ect Mult Living                  Review of Systems  All other systems reviewed and are negative.    Allergies  Ceftin  Home Medications   Current Outpatient Rx  Name Route Sig Dispense Refill  . ALBUTEROL SULFATE HFA 108 (90 BASE) MCG/ACT IN AERS Inhalation Inhale 2 puffs into the lungs every 6 (six) hours as needed.      . ALBUTEROL SULFATE (5 MG/ML) 0.5% IN NEBU Nebulization Take 2.5 mg by nebulization every 6 (six) hours as needed. Shortness of breath or wheezing     . CIPROFLOXACIN HCL 500 MG PO TABS Oral Take 1 tablet (500 mg total) by mouth 2 (two) times  daily. 20 tablet 0  . DOXYCYCLINE HYCLATE 100 MG PO CAPS Oral Take 1 capsule (100 mg total) by mouth 2 (two) times daily. 20 capsule 0  . INSULIN LISPRO (HUMAN) 100 UNIT/ML [place] SOLN Subcutaneous Inject into the skin 3 (three) times daily before meals. Insulin pump : 27 base units 1 unit for every five grams she eats.    . IPRATROPIUM BROMIDE 0.02 % IN SOLN Nebulization Take 2.5 mLs (500 mcg total) by nebulization 4 (four) times daily. 25 mL 0  . METOCLOPRAMIDE HCL 10 MG PO TABS Oral Take 10 mg by mouth 4 (four) times daily.     Marland Kitchen PREDNISONE 50 MG PO TABS Oral Take 50 mg by mouth daily.    Marland Kitchen PRENATAL MULTIVITAMIN CH Oral Take 1 tablet by mouth daily.        BP 136/90  Pulse 113  Temp 98.2 F (36.8 C) (Oral)  Resp 19  SpO2 100%  Physical Exam  Vitals reviewed. Constitutional: She appears well-developed and well-nourished.  Musculoskeletal: She exhibits no tenderness.       Healing ulcer right foot healing cracked area at the base of first toe no erythema no drainage  Neurological: She is alert. She has normal reflexes.  Skin: Skin is warm.  Psychiatric: She  has a normal mood and affect.    ED Course  Procedures (including critical care time)  Labs Reviewed - No data to display No results found.   1. Wound infection       MDM  I advised followup with her primary care physician for recheck on Wednesday she is to continue Cipro and doxycycline she is to watch area carefully for any increasing infection.       Lonia Skinner [place], [place] 03/24/12 1342

## 2012-03-24 NOTE — ED Notes (Signed)
Seen here for 6/16 for blister to bottom of right foot following a pedicure, pt diabetic, has been taking cipro and doxycline, states foot is better, no pain today, just here for recheck.

## 2012-03-24 NOTE — Discharge Instructions (Signed)
Wound Infection °A wound infection happens when a type of germ (bacteria) grows in a wound. Caring for the infection can help the wound heal. Wound infections need treatment. °HOME CARE  °· Only take medicine as told by your doctor.  °· Take your antibiotic medicine as told. Finish it even if you start to feel better.  °· Clean the wound with mild soap and water as told. Rinse the soap off. Pat the area dry with a clean towel. Do not rub the wound.  °· Change any bandages (dressings) as told by your doctor.  °· Put cream and a bandage on the wound as told by your doctor.  °· If the bandage sticks, wet it with soapy water to remove the bandage.  °· Change the bandage if it gets wet, dirty, or starts to smell.  °· Take showers. Do not take baths, swim, or do anything that puts your wound under water.  °· Avoid exercise that makes you sweat.  °· If your wound itches, use a medicine that helps stop itching. Do not pick or scratch at the wound.  °· Keep all doctor visits as told.  °GET HELP RIGHT AWAY IF:  °· You have more puffiness (swelling), pain, or redness around the wound.  °· You have more yellowish-white fluid (pus) coming from the wound.  °· You have a bad smell coming from the wound.  °· Your wound breaks open more.  °· You have a fever.  °MAKE SURE YOU:  °· Understand these instructions.  °· Will watch your condition.  °· Will get help right away if you are not doing well or get worse.  °Document Released: 07/04/2008 Document Revised: 09/14/2011 Document Reviewed: 03/06/2011 °[employer]® Patient Information ©2012 [employer]. °

## 2012-03-25 NOTE — ED Provider Notes (Signed)
Medical screening examination/treatment/procedure(s) were performed by non-physician practitioner and as supervising physician I was immediately available for consultation/collaboration.  Luiz Blare MD   Luiz Blare, MD 03/25/12 [id number]

## 2012-04-15 ENCOUNTER — Other Ambulatory Visit: Payer: Self-pay | Admitting: Pediatrics

## 2012-04-15 DIAGNOSIS — IMO0002 Reserved for concepts with insufficient information to code with codable children: Secondary | ICD-10-CM

## 2012-04-15 NOTE — Progress Notes (Signed)
Stuck with needle after immunizations given. Require exposure panel per protocol.

## 2012-04-16 ENCOUNTER — Other Ambulatory Visit: Payer: Self-pay | Admitting: Pediatrics

## 2012-04-17 LAB — HEPATITIS B SURFACE ANTIBODY,QUALITATIVE

## 2012-04-25 ENCOUNTER — Ambulatory Visit: Payer: 59 | Admitting: Orthopedic Surgery

## 2012-04-30 ENCOUNTER — Inpatient Hospital Stay (HOSPITAL_COMMUNITY)
Admission: EM | Admit: 2012-04-30 | Discharge: 2012-05-01 | DRG: 639 | Disposition: A | Discharge status: Home / Self Care | Payer: 59 | Attending: Internal Medicine | Admitting: Internal Medicine

## 2012-04-30 ENCOUNTER — Inpatient Hospital Stay (HOSPITAL_COMMUNITY): Payer: 59

## 2012-04-30 ENCOUNTER — Encounter (HOSPITAL_COMMUNITY): Payer: Self-pay | Admitting: *Deleted

## 2012-04-30 DIAGNOSIS — K3184 Gastroparesis: Secondary | ICD-10-CM | POA: Diagnosis present

## 2012-04-30 DIAGNOSIS — E1143 Type 2 diabetes mellitus with diabetic autonomic (poly)neuropathy: Secondary | ICD-10-CM | POA: Diagnosis present

## 2012-04-30 DIAGNOSIS — I498 Other specified cardiac arrhythmias: Secondary | ICD-10-CM | POA: Diagnosis present

## 2012-04-30 DIAGNOSIS — E1065 Type 1 diabetes mellitus with hyperglycemia: Secondary | ICD-10-CM | POA: Diagnosis present

## 2012-04-30 DIAGNOSIS — E1049 Type 1 diabetes mellitus with other diabetic neurological complication: Secondary | ICD-10-CM | POA: Diagnosis present

## 2012-04-30 DIAGNOSIS — E1039 Type 1 diabetes mellitus with other diabetic ophthalmic complication: Secondary | ICD-10-CM | POA: Diagnosis present

## 2012-04-30 DIAGNOSIS — E1310 Other specified diabetes mellitus with ketoacidosis without coma: Secondary | ICD-10-CM | POA: Diagnosis present

## 2012-04-30 DIAGNOSIS — Z794 Long term (current) use of insulin: Secondary | ICD-10-CM

## 2012-04-30 DIAGNOSIS — E860 Dehydration: Secondary | ICD-10-CM | POA: Diagnosis present

## 2012-04-30 DIAGNOSIS — Z8679 Personal history of other diseases of the circulatory system: Secondary | ICD-10-CM | POA: Diagnosis present

## 2012-04-30 DIAGNOSIS — E10319 Type 1 diabetes mellitus with unspecified diabetic retinopathy without macular edema: Secondary | ICD-10-CM | POA: Diagnosis present

## 2012-04-30 DIAGNOSIS — E1139 Type 2 diabetes mellitus with other diabetic ophthalmic complication: Secondary | ICD-10-CM

## 2012-04-30 DIAGNOSIS — Z9641 Presence of insulin pump (external) (internal): Secondary | ICD-10-CM

## 2012-04-30 DIAGNOSIS — E1149 Type 2 diabetes mellitus with other diabetic neurological complication: Secondary | ICD-10-CM

## 2012-04-30 DIAGNOSIS — Z79899 Other long term (current) drug therapy: Secondary | ICD-10-CM

## 2012-04-30 DIAGNOSIS — R5381 Other malaise: Secondary | ICD-10-CM | POA: Diagnosis present

## 2012-04-30 DIAGNOSIS — R7309 Other abnormal glucose: Secondary | ICD-10-CM

## 2012-04-30 DIAGNOSIS — E1142 Type 2 diabetes mellitus with diabetic polyneuropathy: Secondary | ICD-10-CM | POA: Insufficient documentation

## 2012-04-30 DIAGNOSIS — E1110 Type 2 diabetes mellitus with ketoacidosis without coma: Secondary | ICD-10-CM | POA: Diagnosis present

## 2012-04-30 DIAGNOSIS — R112 Nausea with vomiting, unspecified: Secondary | ICD-10-CM | POA: Diagnosis present

## 2012-04-30 DIAGNOSIS — Z9289 Personal history of other medical treatment: Secondary | ICD-10-CM

## 2012-04-30 DIAGNOSIS — H579 Unspecified disorder of eye and adnexa: Secondary | ICD-10-CM

## 2012-04-30 DIAGNOSIS — E11319 Type 2 diabetes mellitus with unspecified diabetic retinopathy without macular edema: Secondary | ICD-10-CM | POA: Diagnosis present

## 2012-04-30 DIAGNOSIS — J45909 Unspecified asthma, uncomplicated: Secondary | ICD-10-CM | POA: Diagnosis present

## 2012-04-30 DIAGNOSIS — E1010 Type 1 diabetes mellitus with ketoacidosis without coma: Principal | ICD-10-CM | POA: Diagnosis present

## 2012-04-30 DIAGNOSIS — R739 Hyperglycemia, unspecified: Secondary | ICD-10-CM

## 2012-04-30 HISTORY — DX: Type 2 diabetes mellitus with diabetic polyneuropathy: E11.42

## 2012-04-30 HISTORY — DX: Type 2 diabetes mellitus with diabetic autonomic (poly)neuropathy: E11.43

## 2012-04-30 HISTORY — DX: Gastroparesis: K31.84

## 2012-04-30 HISTORY — DX: Other specified diabetes mellitus with unspecified diabetic retinopathy without macular edema: E13.319

## 2012-04-30 HISTORY — DX: Type 1 diabetes mellitus with diabetic neuropathy, unspecified: E10.40

## 2012-04-30 HISTORY — DX: Type 1 diabetes mellitus with severe nonproliferative diabetic retinopathy with macular edema, unspecified eye: E10.3419

## 2012-04-30 LAB — BASIC METABOLIC PANEL
BUN: 28 mg/dL — ABNORMAL HIGH (ref 6–23)
BUN: 26 mg/dL — ABNORMAL HIGH (ref 6–23)
CO2: 15 mEq/L — ABNORMAL LOW (ref 19–32)
CO2: 18 mEq/L — ABNORMAL LOW (ref 19–32)
CO2: 20 mEq/L (ref 19–32)
Chloride: 101 mEq/L (ref 96–112)
Chloride: 104 mEq/L (ref 96–112)
Chloride: 106 mEq/L (ref 96–112)
Chloride: 108 mEq/L (ref 96–112)
Creatinine, Ser: 0.91 mg/dL (ref 0.50–1.10)
GFR calc Af Amer: >90 mL/min (ref >90)
Glucose, Bld: 239 mg/dL — ABNORMAL HIGH (ref 70–99)
Glucose, Bld: 158 mg/dL — ABNORMAL HIGH (ref 70–99)
Potassium: 4.4 mEq/L (ref 3.5–5.1)
Potassium: 4.4 mEq/L (ref 3.5–5.1)
Potassium: 3.9 mEq/L (ref 3.5–5.1)
Potassium: 3.5 mEq/L (ref 3.5–5.1)
Sodium: 135 mEq/L (ref 135–145)
Sodium: 137 mEq/L (ref 135–145)
Sodium: 139 mEq/L (ref 135–145)

## 2012-04-30 LAB — URINALYSIS, ROUTINE W REFLEX MICROSCOPIC
Bilirubin Urine: NEGATIVE
Ketones, ur: >80 mg/dL — AB
Leukocytes, UA: NEGATIVE
Nitrite: NEGATIVE
Protein, ur: NEGATIVE mg/dL
pH: 5.5 (ref 5.0–8.0)

## 2012-04-30 LAB — CBC WITH DIFFERENTIAL/PLATELET
Eosinophils Absolute: 0 10*3/uL (ref 0.0–0.7)
Lymphs Abs: 1.1 10*3/uL (ref 0.7–4.0)
MCH: 28.2 pg (ref 26.0–34.0)
MCHC: 34 g/dL (ref 30.0–36.0)
MCV: 82.9 fL (ref 78.0–100.0)
Monocytes Absolute: 0.5 10*3/uL (ref 0.1–1.0)
Neutrophils Relative %: 87 % — ABNORMAL HIGH (ref 43–77)
Platelets: 314 10*3/uL (ref 150–400)
RBC: 4.86 MIL/uL (ref 3.87–5.11)
RDW: 13.2 % (ref 11.5–15.5)

## 2012-04-30 LAB — COMPREHENSIVE METABOLIC PANEL
ALT: 17 U/L (ref 0–35)
AST: 21 U/L (ref 0–37)
Albumin: 4 g/dL (ref 3.5–5.2)
Alkaline Phosphatase: 139 U/L — ABNORMAL HIGH (ref 39–117)
CO2: 17 mEq/L — ABNORMAL LOW (ref 19–32)
Chloride: 90 mEq/L — ABNORMAL LOW (ref 96–112)
Creatinine, Ser: 0.97 mg/dL (ref 0.50–1.10)
GFR calc non Af Amer: 79 mL/min — ABNORMAL LOW (ref >90)
Potassium: 5.2 mEq/L — ABNORMAL HIGH (ref 3.5–5.1)
Total Bilirubin: 1.1 mg/dL (ref 0.3–1.2)

## 2012-04-30 LAB — CBC
HCT: 37.9 % (ref 36.0–46.0)
Hemoglobin: 12.7 g/dL (ref 12.0–15.0)
MCH: 27.7 pg (ref 26.0–34.0)
MCHC: 33.5 g/dL (ref 30.0–36.0)
RBC: 4.59 MIL/uL (ref 3.87–5.11)

## 2012-04-30 LAB — POCT PREGNANCY, URINE: Preg Test, Ur: NEGATIVE

## 2012-04-30 LAB — URINE MICROSCOPIC-ADD ON

## 2012-04-30 LAB — POCT I-STAT, CHEM 8
BUN: 27 mg/dL — ABNORMAL HIGH (ref 6–23)
Calcium, Ion: 1.19 mmol/L (ref 1.12–1.23)
Creatinine, Ser: 1 mg/dL (ref 0.50–1.10)
Hemoglobin: 12.9 g/dL (ref 12.0–15.0)
Sodium: 134 mEq/L — ABNORMAL LOW (ref 135–145)
TCO2: 18 mmol/L (ref 0–100)

## 2012-04-30 LAB — MRSA PCR SCREENING: MRSA by PCR: NEGATIVE

## 2012-04-30 LAB — GLUCOSE, CAPILLARY

## 2012-04-30 LAB — POCT I-STAT TROPONIN I

## 2012-04-30 MED ORDER — FLUTICASONE PROPIONATE HFA 44 MCG/ACT IN AERO
2.0000 | INHALATION_SPRAY | Freq: Two times a day (BID) | RESPIRATORY_TRACT | Status: DC
  Administered 2012-04-30 – 2012-05-01 (×2): 2 via RESPIRATORY_TRACT
  Filled 2012-04-30: qty 10.6

## 2012-04-30 MED ORDER — SODIUM CHLORIDE 0.9 % IJ SOLN
INTRAMUSCULAR | Status: AC
  Administered 2012-04-30: 10 mL
  Filled 2012-04-30: qty 10

## 2012-04-30 MED ORDER — ENOXAPARIN SODIUM 40 MG/0.4ML ~~LOC~~ SOLN
40.0000 mg | SUBCUTANEOUS | Status: DC
  Administered 2012-04-30: 40 mg via SUBCUTANEOUS
  Filled 2012-04-30 (×2): qty 0.4

## 2012-04-30 MED ORDER — SODIUM CHLORIDE 0.9 % IV SOLN
INTRAVENOUS | Status: DC
  Administered 2012-04-30: 125 mL/h via INTRAVENOUS

## 2012-04-30 MED ORDER — ONDANSETRON HCL 4 MG/2ML IJ SOLN: 4.0000 mg | INTRAMUSCULAR | Status: DC | PRN

## 2012-04-30 MED ORDER — DEXTROSE 50 % IV SOLN: 25.0000 mL | INTRAVENOUS | Status: DC | PRN

## 2012-04-30 MED ORDER — ONDANSETRON HCL 4 MG/2ML IJ SOLN
4.0000 mg | Freq: Once | INTRAMUSCULAR | Status: AC
  Administered 2012-04-30: 4 mg via INTRAVENOUS

## 2012-04-30 MED ORDER — ONDANSETRON HCL 4 MG/2ML IJ SOLN
INTRAMUSCULAR | Status: AC
  Administered 2012-04-30: 4 mg via INTRAVENOUS
  Filled 2012-04-30: qty 2

## 2012-04-30 MED ORDER — ALBUTEROL SULFATE HFA 108 (90 BASE) MCG/ACT IN AERS: 2.0000 | INHALATION_SPRAY | Freq: Four times a day (QID) | RESPIRATORY_TRACT | Status: DC | PRN

## 2012-04-30 MED ORDER — INSULIN REGULAR HUMAN 100 UNIT/ML IJ SOLN
INTRAMUSCULAR | Status: DC
  Filled 2012-04-30: qty 1

## 2012-04-30 MED ORDER — ALBUTEROL SULFATE (5 MG/ML) 0.5% IN NEBU: 2.5000 mg | INHALATION_SOLUTION | Freq: Four times a day (QID) | RESPIRATORY_TRACT | Status: DC | PRN

## 2012-04-30 MED ORDER — PANTOPRAZOLE SODIUM 40 MG IV SOLR
40.0000 mg | INTRAVENOUS | Status: DC
  Administered 2012-04-30: 40 mg via INTRAVENOUS
  Filled 2012-04-30 (×2): qty 40

## 2012-04-30 MED ORDER — SODIUM CHLORIDE 0.9 % IV SOLN: INTRAVENOUS | Status: DC

## 2012-04-30 MED ORDER — SODIUM CHLORIDE 0.9 % IV SOLN
INTRAVENOUS | Status: DC
  Administered 2012-04-30: 5 [IU]/h via INTRAVENOUS
  Filled 2012-04-30: qty 1

## 2012-04-30 MED ORDER — PROMETHAZINE HCL 25 MG/ML IJ SOLN
25.0000 mg | Freq: Once | INTRAMUSCULAR | Status: AC
  Administered 2012-04-30: 25 mg via INTRAVENOUS
  Filled 2012-04-30: qty 1

## 2012-04-30 MED ORDER — DEXTROSE-NACL 5-0.45 % IV SOLN
INTRAVENOUS | Status: DC
  Administered 2012-04-30 – 2012-05-01 (×2): via INTRAVENOUS

## 2012-04-30 MED ORDER — SODIUM CHLORIDE 0.9 % IV BOLUS (SEPSIS)
1000.0000 mL | Freq: Once | INTRAVENOUS | Status: AC
  Administered 2012-04-30: 1000 mL via INTRAVENOUS

## 2012-04-30 NOTE — ED Provider Notes (Signed)
History     CSN: [id number]  Arrival date & time 04/30/12  1110   First MD Initiated Contact with Patient 04/30/12 1143      Chief Complaint  Patient presents with  . Hyperglycemia    (Consider location/radiation/quality/duration/timing/severity/associated sxs/prior treatment) HPI Comments: The patient is a 26 year old type 1 diabetic patient who presents to the ED today with hyperglycemia. She reports changing her insulin pump site last night. She woke up this morning not feeling well and noticed that the pump tubing was kinked and most likely had been all night because her subsequent blood glucose checks revealed >500. She reports nausea and vomiting and is concerned she is in DKA. Patient has a history of diabetic retinopathy and gastroparesis.      Past Medical History  Diagnosis Date  . Asthma   . Diabetes mellitus   . Tachycardia   . Tachycardia     baseline tachycardia   . Retinopathy due to secondary diabetes     Past Surgical History  Procedure Date  . Cholecystectomy   . Cesarean section     No family history on file.  History  Substance Use Topics  . Smoking status: Never Smoker   . Smokeless tobacco: Not on file  . Alcohol Use: No    OB History    Grav Para Term Preterm Abortions TAB SAB Ect Mult Living                  Review of Systems  Constitutional: Negative for fever.  Eyes:       Denies and recent visual changes, aside from diabetic retinopathy.   Respiratory: Negative for shortness of breath.   Cardiovascular: Negative for chest pain.  Gastrointestinal: Positive for nausea, vomiting and abdominal pain. Negative for diarrhea and constipation.  Genitourinary: Positive for frequency.  Skin: Negative for color change.  Neurological: Negative for dizziness.  Psychiatric/Behavioral: Negative for confusion.    Allergies  Ceftin  Home Medications   Current Outpatient Rx  Name Route Sig Dispense Refill  . ALBUTEROL SULFATE HFA 108 (90  BASE) MCG/ACT IN AERS Inhalation Inhale 2 puffs into the lungs every 6 (six) hours as needed. As needed for shortness of breath.    . BUDESONIDE 180 MCG/ACT IN AEPB Inhalation Inhale 1 puff into the lungs 2 (two) times daily.    Marland Kitchen GLUCAGON (RDNA) 1 MG IJ KIT Intravenous Inject 1 mg into the vein once as needed. As needed for emergency to lower blood sugar.    . INSULIN LISPRO (HUMAN) 100 UNIT/ML [place] SOLN Subcutaneous Inject into the skin 3 (three) times daily before meals. Insulin pump : 31 base units 1 unit for every five grams she eats.    . OCUVITE-LUTEIN PO CAPS Oral Take 1 capsule by mouth daily.    Marland Kitchen PRENATAL MULTIVITAMIN CH Oral Take 1 tablet by mouth daily.      . ALBUTEROL SULFATE (5 MG/ML) 0.5% IN NEBU Nebulization Take 2.5 mg by nebulization every 6 (six) hours as needed. Shortness of breath or wheezing     . METOCLOPRAMIDE HCL 10 MG PO TABS Oral Take 10 mg by mouth 4 (four) times daily as needed. Gastroparesis.      BP 114/57  Pulse 114  Temp 97.9 F (36.6 C) (Oral)  Resp 21  SpO2 99%  LMP 04/30/2012  Physical Exam  Nursing note and vitals reviewed. Constitutional: She appears well-developed and well-nourished. No distress.  HENT:  Head: Normocephalic and atraumatic.  Eyes:  Conjunctivae are normal.  Neck: Normal range of motion.  Cardiovascular: Normal rate and regular rhythm.  Exam reveals no gallop and no friction rub.   No murmur heard. Pulmonary/Chest: Effort normal and breath sounds normal. She has no wheezes. She has no rales. She exhibits no tenderness.  Abdominal: Soft. There is tenderness.       Tenderness to palpation in epigastric region.   Musculoskeletal: Normal range of motion.  Neurological: She is alert.       Speech is goal-oriented. Moves limbs without ataxia.   Skin: Skin is warm and dry.  Psychiatric: She has a normal mood and affect.    ED Course  Procedures (including critical care time)   Date: 04/30/2012  Rate: 131  Rhythm: sinus  tachycardia  QRS Axis: right  Intervals: normal  ST/T Wave abnormalities: normal  Conduction Disutrbances:none  Narrative Interpretation: abnormal ekg  Old EKG Reviewed: essentially unchanged    Labs Reviewed  COMPREHENSIVE METABOLIC PANEL - Abnormal; Notable for the following:    Sodium 129 (*)     Potassium 5.2 (*)     Chloride 90 (*)     CO2 17 (*)     Glucose, Bld 637 (*)     BUN 27 (*)     Alkaline Phosphatase 139 (*)     GFR calc non Af Amer 79 (*)     All other components within normal limits  CBC WITH DIFFERENTIAL - Abnormal; Notable for the following:    WBC 12.4 (*)     Neutrophils Relative 87 (*)     Lymphocytes Relative 9 (*)     Neutro Abs 10.8 (*)     All other components within normal limits  URINALYSIS, ROUTINE W REFLEX MICROSCOPIC - Abnormal; Notable for the following:    Glucose, UA >1000 (*)     Hgb urine dipstick MODERATE (*)     Ketones, ur >80 (*)     All other components within normal limits  GLUCOSE, CAPILLARY - Abnormal; Notable for the following:    Glucose-Capillary >600 (*)     All other components within normal limits  POCT I-STAT, CHEM 8 - Abnormal; Notable for the following:    Sodium 134 (*)     BUN 27 (*)     Glucose, Bld 557 (*)     All other components within normal limits  POCT PREGNANCY, URINE  POCT I-STAT TROPONIN I  URINE MICROSCOPIC-ADD ON   No results found.   No diagnosis found.    MDM  1:41 PM Patient's labs reveal DKA. Insulin drip and fluids started. Patient will be admitted to step down unit. Plan discussed with Dr. Nino Parsley who is agreeable.  Filed Vitals:   04/30/12 1347  BP: 103/47  Pulse: 134  Temp:   Resp: 27          Diesel Lina, PA-C 04/30/12 1500

## 2012-04-30 NOTE — ED Notes (Signed)
Pt is on insulin pump and changed site last nite.  Pt went to bathroom a couple of times and bolused self more insulin.  Pt states that she woke up feeling horrible.  PT states that she is sure she did not get insulin for over 12 hours after finding catheter kinked.  Pt last sugar was 567 and rechecked it 45 minutes ago and blood sugar was greater than 600.  Pt hr 137 and continues to vomiting.  Pt concerned she is in DKA

## 2012-04-30 NOTE — ED Notes (Signed)
Patients heart rate increasing (patient is actively vomiting)  Nurse jennifer advised.

## 2012-04-30 NOTE — ED Notes (Signed)
Critical lab value glucose 637 reported to EDP

## 2012-04-30 NOTE — ED Notes (Signed)
Pt is complaining of sharp chest pain

## 2012-04-30 NOTE — Progress Notes (Signed)
[name], is a 26 y.o. female,   MRN: [id number]  -  DOB - 1986-01-17  Outpatient Primary MD for the patient is [facility] TOM, MD  in for    Chief Complaint  Patient presents with  . Hyperglycemia     Blood pressure 114/57, pulse 114, temperature 97.9 F (36.6 C), temperature source Oral, resp. rate 21, last menstrual period 04/30/2012, SpO2 99.00%.  Principal Problem:  *DKA (diabetic ketoacidoses) Active Problems:  DIABETES MELLITUS, TYPE I, UNCONTROLLED, WITH KETOACIDOSIS  Gastroparesis  Hyperglycemia   26yo hx DM type 1 on insulin pump presented to ED feeling nauseated, weak. Reports that changed tubing to insulin pump last evening and it was kinked this am when she awakened. Reports blood sugar at home 567, in ED >600. Work up yields initial BMET with gap of 22. Given insulin and BMET at 1pm with gap 14 and glucomander ordered. Some nausea no vomiting. Hemodynamically stable. HR 110-120, K=5.1 SBP 103.

## 2012-04-30 NOTE — ED Provider Notes (Signed)
Medical screening examination/treatment/procedure(s) were conducted as a shared visit with non-physician practitioner(s) and myself.  I personally evaluated the patient during the encounter Diabetic. Pump not working since last night. Feels bad. No infectious sxs.  No pain.  pe nl except tachycardia.  Will check for dka.   Dx dka Insulin gtt.   CRITICAL CARE Performed by: Nicholes Stairs   Total critical care time: 30 min  Critical care time was exclusive of separately billable procedures and treating other patients.  Critical care was necessary to treat or prevent imminent or life-threatening deterioration.  Critical care was time spent personally by me on the following activities: development of treatment plan with patient and/or surrogate as well as nursing, discussions with consultants, evaluation of patient's response to treatment, examination of patient, obtaining history from patient or surrogate, ordering and performing treatments and interventions, ordering and review of laboratory studies, ordering and review of radiographic studies, pulse oximetry and re-evaluation of patient's condition.  Cheri Guppy, MD 04/30/12 1325

## 2012-04-30 NOTE — H&P (Addendum)
History and Physical Examination  Date: 04/30/2012  Patient name: [name] Medical record number: [id number] Date of birth: [birth year] Age: 26 y.o. Gender: female PCP: Leo Grosser, MD  Chief Complaint:  Chief Complaint  Patient presents with  . Hyperglycemia    History of Present Illness: [name] is an 26 y.o. female with poorly controlled type 1 diabetes mellitus with complications including retinopathy, neuropathy and gastroparesis presented to the emergency department today with hyperglycemia.  The patient is currently using an insulin pump.  The patient reports that she had a site change last night.  She reports that she went to bed and woke up 12 hours later.  The patient reports that she had gotten up several times in the middle of the night to urinate.  She did not check her blood glucose at that time.  It is unusual for her to urinate in the middle of the night.  She woke up early this morning feeling very tired and had significant malaise and nausea.  She checked her blood glucose and it was greater than 600.  She provided a correction dose of insulin and went to work.  She reports that she checked her blood glucose 3 hours later and it was greater than 600.  She gave an additional correction dose of insulin and rechecked 45 minutes later and her blood glucose continued to remain high.  She also reports that she began to develop nausea and vomiting and worsening malaise and weakness.  She saw physician at her job but recommended that she come to the emergency department for further treatment.  Diabetes history: This patient reports that she was diagnosed with type 1 diabetes mellitus at the age of 67.  She has had long-standing poorly controlled diabetes mellitus and reports that her hemoglobin A1c is about 9.1%.  She has had improved glycemic control since starting the [employer] MiniMed insulin pump 2 years ago.  She has not had major complications with the pumpp with  the exception of the severe hypoglycemic event that occurred approximately 5 weeks ago where she had a blood glucose of 25.  She did not receive glucagon at that time.  She was treated by her boyfriend with oral treatments and her blood glucose eventually improved at that time.  She reports no other significant problems associated with her insulin pump.  She is followed closely by an endocrinologist Dr. Sharl Ma.  She has severe diabetic retinopathy requiring laser surgery and possibly leading to blindness in the near future.  She has gastroparesis and history is significant diabetic neuropathy. Insulin pump settings: 12A 1.00, 6A 1.30, 10A 1.25 total basal 30.20. 1:30, Target bs 100.  Past Medical History Past Medical History  Diagnosis Date  . Asthma   . Tachycardia     baseline tachycardia   . Retinopathy due to secondary diabetes   . Diabetic gastroparesis   . 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 Diabetic neuropathy, type I diabetes mellitus   . Polyneuropathy in diabetes     Past Surgical History Past Surgical History  Procedure Date  . Cholecystectomy   . Cesarean section   . Refractive surgery     Home Meds: Prior to Admission medications   Medication Sig Start Date End Date Taking? Authorizing Provider  albuterol (PROVENTIL HFA;VENTOLIN HFA) 108 (90 BASE) MCG/ACT inhaler Inhale 2 puffs into the lungs every 6 (six) hours as needed. As needed for shortness of breath.   Yes Historical  Provider, MD  budesonide (PULMICORT) 180 MCG/ACT inhaler Inhale 1 puff into the lungs 2 (two) times daily.   Yes Historical Provider, MD  glucagon 1 MG injection Inject 1 mg into the vein once as needed. As needed for emergency to lower blood sugar.   Yes Historical Provider, MD  insulin lispro (HUMALOG) 100 UNIT/ML injection Inject into the skin 3 (three) times daily before meals. Insulin pump : 31 base units 1 unit for every five grams she eats.   Yes Historical  Provider, MD  multivitamin-lutein (OCUVITE-LUTEIN) CAPS Take 1 capsule by mouth daily.   Yes Historical Provider, MD  Prenatal Vit-Fe Fumarate-FA (PRENATAL MULTIVITAMIN) TABS Take 1 tablet by mouth daily.     Yes Historical Provider, MD  albuterol (PROVENTIL) (5 MG/ML) 0.5% nebulizer solution Take 2.5 mg by nebulization every 6 (six) hours as needed. Shortness of breath or wheezing     Historical Provider, MD  metoCLOPramide (REGLAN) 10 MG tablet Take 10 mg by mouth 4 (four) times daily as needed. Gastroparesis.    Historical Provider, MD    Allergies: Ceftin  Social History:  History   Social History  . Marital Status: Single    Spouse Name: N/A    Number of Children: N/A  . Years of Education: N/A   Occupational History  . Not on file.   Social History Main Topics  . Smoking status: Never Smoker   . Smokeless tobacco: Not on file  . Alcohol Use: No  . Drug Use: No  . Sexually Active:    Other Topics Concern  . Not on file   Social History Narrative   Pt has a daughter and boyfriend.    Family History:  Family History  Problem Relation Age of Onset  . Hypertension      Review of Systems: Pertinent items are noted in HPI. All other systems reviewed and reported as negative.   Physical Exam: Blood pressure 103/47, pulse 134, temperature 97.9 F (36.6 C), temperature source Oral, resp. rate 27, last menstrual period 04/30/2012, SpO2 99.00%. General appearance: alert, cooperative, appears stated age, fatigued and no distress Head: Normocephalic, without obvious abnormality, atraumatic Eyes: negative Nose: Nares normal. Septum midline. Mucosa normal. No drainage or sinus tenderness., no discharge Throat: severe dry mucous membranes Neck: no adenopathy, no carotid bruit, no JVD, supple, symmetrical, trachea midline and thyroid not enlarged, symmetric, no tenderness/mass/nodules Back: symmetric, no curvature. ROM normal. No CVA tenderness. Lungs: clear to auscultation  bilaterally Heart: S1, S2 normal and tachycardic Abdomen: soft, non-tender; bowel sounds normal; no masses,  no organomegaly Extremities: extremities normal, atraumatic, no cyanosis or edema Pulses: 2+ and symmetric Skin: Skin color, texture, turgor normal. No rashes or lesions Neurologic: Alert and oriented X 3, normal strength and tone. Normal symmetric reflexes. Normal coordination and gait  Lab  And Imaging results:  Results for orders placed during the hospital encounter of 04/30/12 (from the past 24 hour(s))  GLUCOSE, CAPILLARY     Status: Abnormal   Collection Time   04/30/12 11:29 AM      Component Value Range   Glucose-Capillary >600 (*) 70 - 99 mg/dL   Comment 1 Documented in Chart     Comment 2 Notify RN    COMPREHENSIVE METABOLIC PANEL     Status: Abnormal   Collection Time   04/30/12 11:31 AM      Component Value Range   Sodium 129 (*) 135 - 145 mEq/L   Potassium 5.2 (*) 3.5 - 5.1 mEq/L  Chloride 90 (*) 96 - 112 mEq/L   CO2 17 (*) 19 - 32 mEq/L   Glucose, Bld 637 (*) 70 - 99 mg/dL   BUN 27 (*) 6 - 23 mg/dL   Creatinine, Ser 7.56  0.50 - 1.10 mg/dL   Calcium 43.3  8.4 - 29.5 mg/dL   Total Protein 7.1  6.0 - 8.3 g/dL   Albumin 4.0  3.5 - 5.2 g/dL   AST 21  0 - 37 U/L   ALT 17  0 - 35 U/L   Alkaline Phosphatase 139 (*) 39 - 117 U/L   Total Bilirubin 1.1  0.3 - 1.2 mg/dL   GFR calc non Af Amer 79 (*) >90 mL/min   GFR calc Af Amer >90  >90 mL/min  CBC WITH DIFFERENTIAL     Status: Abnormal   Collection Time   04/30/12 11:31 AM      Component Value Range   WBC 12.4 (*) 4.0 - 10.5 K/uL   RBC 4.86  3.87 - 5.11 MIL/uL   Hemoglobin 13.7  12.0 - 15.0 g/dL   HCT 18.8  41.6 - 60.6 %   MCV 82.9  78.0 - 100.0 fL   MCH 28.2  26.0 - 34.0 pg   MCHC 34.0  30.0 - 36.0 g/dL   RDW 30.1  60.1 - 09.3 %   Platelets 314  150 - 400 K/uL   Neutrophils Relative 87 (*) 43 - 77 %   Lymphocytes Relative 9 (*) 12 - 46 %   Monocytes Relative 4  3 - 12 %   Eosinophils Relative 0  0 - 5 %    Basophils Relative 0  0 - 1 %   Neutro Abs 10.8 (*) 1.7 - 7.7 K/uL   Lymphs Abs 1.1  0.7 - 4.0 K/uL   Monocytes Absolute 0.5  0.1 - 1.0 K/uL   Eosinophils Absolute 0.0  0.0 - 0.7 K/uL   Basophils Absolute 0.0  0.0 - 0.1 K/uL   Smear Review MORPHOLOGY UNREMARKABLE    URINALYSIS, ROUTINE W REFLEX MICROSCOPIC     Status: Abnormal   Collection Time   04/30/12 11:35 AM      Component Value Range   Color, Urine YELLOW  YELLOW   APPearance CLEAR  CLEAR   Specific Gravity, Urine 1.027  1.005 - 1.030   pH 5.5  5.0 - 8.0   Glucose, UA >1000 (*) NEGATIVE mg/dL   Hgb urine dipstick MODERATE (*) NEGATIVE   Bilirubin Urine NEGATIVE  NEGATIVE   Ketones, ur >80 (*) NEGATIVE mg/dL   Protein, ur NEGATIVE  NEGATIVE mg/dL   Urobilinogen, UA 0.2  0.0 - 1.0 mg/dL   Nitrite NEGATIVE  NEGATIVE   Leukocytes, UA NEGATIVE  NEGATIVE  URINE MICROSCOPIC-ADD ON     Status: Normal   Collection Time   04/30/12 11:35 AM      Component Value Range   Squamous Epithelial / LPF RARE  RARE   WBC, UA 0-2  <3 WBC/hpf   RBC / HPF 3-6  <3 RBC/hpf   Bacteria, UA RARE  RARE  POCT I-STAT TROPONIN I     Status: Normal   Collection Time   04/30/12 11:50 AM      Component Value Range   Troponin i, poc 0.01  0.00 - 0.08 ng/mL   Comment 3           POCT PREGNANCY, URINE     Status: Normal   Collection Time   04/30/12  11:56 AM      Component Value Range   Preg Test, Ur NEGATIVE  NEGATIVE  POCT I-STAT, CHEM 8     Status: Abnormal   Collection Time   04/30/12  1:00 PM      Component Value Range   Sodium 134 (*) 135 - 145 mEq/L   Potassium 5.1  3.5 - 5.1 mEq/L   Chloride 103  96 - 112 mEq/L   BUN 27 (*) 6 - 23 mg/dL   Creatinine, Ser 1.61  0.50 - 1.10 mg/dL   Glucose, Bld 096 (*) 70 - 99 mg/dL   Calcium, Ion 0.45  4.09 - 1.23 mmol/L   TCO2 18  0 - 100 mmol/L   Hemoglobin 12.9  12.0 - 15.0 g/dL   HCT 81.1  91.4 - 78.2 %   Comment NOTIFIED PHYSICIAN     Impression   *DKA (diabetic ketoacidoses)  DIABETES MELLITUS,  TYPE I, UNCONTROLLED, WITH KETOACIDOSIS  ATRIAL TACHYCARDIA  Gastroparesis  Hyperglycemia  Diabetic retinopathy associated with type 1 diabetes mellitus  Polyneuropathy in diabetes  Nausea and vomiting  Dehydration  H/O insertion of insulin pump  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  Make patient n.p.o. except ice chips.  Provide medications for nausea and vomiting. consult inpatient diabetes education coordinators for insulin pump refresher course, check hemoglobin A1c, right medications for stress ulcer prophylaxis and DVT prophylaxis.  Please see orders and follow hospital course.  Likely will transition to Lantus and NovoLog or may consider restarting insulin pump prior to discharge if appropriate.    Critical care time spent 45 mins  Standley Dakins MD [facility] [place], [place] [phone] 04/30/2012, 2:41 PM

## 2012-04-30 NOTE — Progress Notes (Signed)
Pt arrived from ED, VSS. CBG check and started on glucostabilizer. No complaints at this time of Nausea. Headache a 3/10 pain but is tolerable.  Call bell within reach. Oriented to unit and routine.  Ice chips provided per order. Will continue to monitor.

## 2012-05-01 LAB — GLUCOSE, CAPILLARY
Glucose-Capillary: 102 mg/dL — ABNORMAL HIGH (ref 70–99)
Glucose-Capillary: 121 mg/dL — ABNORMAL HIGH (ref 70–99)
Glucose-Capillary: 194 mg/dL — ABNORMAL HIGH (ref 70–99)
Glucose-Capillary: 254 mg/dL — ABNORMAL HIGH (ref 70–99)
Glucose-Capillary: 317 mg/dL — ABNORMAL HIGH (ref 70–99)
Glucose-Capillary: 320 mg/dL — ABNORMAL HIGH (ref 70–99)
Glucose-Capillary: 380 mg/dL — ABNORMAL HIGH (ref 70–99)
Glucose-Capillary: 406 mg/dL — ABNORMAL HIGH (ref 70–99)
Glucose-Capillary: 443 mg/dL — ABNORMAL HIGH (ref 70–99)
Glucose-Capillary: 473 mg/dL — ABNORMAL HIGH (ref 70–99)

## 2012-05-01 LAB — BASIC METABOLIC PANEL
BUN: 22 mg/dL (ref 6–23)
CO2: 19 mEq/L (ref 19–32)
Calcium: 8.5 mg/dL (ref 8.4–10.5)
Chloride: 105 mEq/L (ref 96–112)
Creatinine, Ser: 0.94 mg/dL (ref 0.50–1.10)
GFR calc Af Amer: >90 mL/min (ref >90)
GFR calc Af Amer: >90 mL/min (ref >90)
GFR calc Af Amer: >90 mL/min (ref >90)
GFR calc non Af Amer: >90 mL/min (ref >90)
GFR calc non Af Amer: 85 mL/min — ABNORMAL LOW (ref >90)
Glucose, Bld: 490 mg/dL — ABNORMAL HIGH (ref 70–99)
Potassium: 3.9 mEq/L (ref 3.5–5.1)
Potassium: 4.2 mEq/L (ref 3.5–5.1)
Sodium: 133 mEq/L — ABNORMAL LOW (ref 135–145)
Sodium: 132 mEq/L — ABNORMAL LOW (ref 135–145)

## 2012-05-01 LAB — CBC
HCT: 32.4 % — ABNORMAL LOW (ref 36.0–46.0)
MCH: 27.7 pg (ref 26.0–34.0)
MCHC: 33.4 g/dL (ref 30.0–36.0)
MCV: 82.4 fL (ref 78.0–100.0)
Platelets: 265 10*3/uL (ref 150–400)
Platelets: 269 10*3/uL (ref 150–400)
RBC: 3.93 MIL/uL (ref 3.87–5.11)
RBC: 4.11 MIL/uL (ref 3.87–5.11)
WBC: 10.3 10*3/uL (ref 4.0–10.5)

## 2012-05-01 LAB — HEMOGLOBIN A1C: Mean Plasma Glucose: 243 mg/dL — ABNORMAL HIGH (ref <117)

## 2012-05-01 MED ORDER — SODIUM CHLORIDE 0.9 % IV SOLN: INTRAVENOUS | Status: DC

## 2012-05-01 MED ORDER — SODIUM CHLORIDE 0.9 % IV SOLN
INTRAVENOUS | Status: DC
  Administered 2012-05-01: 16:00:00 via INTRAVENOUS

## 2012-05-01 MED ORDER — HEPARIN SODIUM (PORCINE) 5000 UNIT/ML IJ SOLN
5000.0000 [IU] | Freq: Three times a day (TID) | INTRAMUSCULAR | Status: DC
  Filled 2012-05-01 (×2): qty 1

## 2012-05-01 MED ORDER — INSULIN GLARGINE 100 UNIT/ML ~~LOC~~ SOLN: 20.0000 [IU] | SUBCUTANEOUS | Status: DC

## 2012-05-01 MED ORDER — SODIUM CHLORIDE 0.9 % IV SOLN
INTRAVENOUS | Status: DC
  Filled 2012-05-01: qty 1

## 2012-05-01 MED ORDER — DEXTROSE-NACL 5-0.45 % IV SOLN: INTRAVENOUS | Status: DC

## 2012-05-01 MED ORDER — INSULIN GLARGINE 100 UNIT/ML ~~LOC~~ SOLN
5.0000 [IU] | SUBCUTANEOUS | Status: AC
  Administered 2012-05-01: 5 [IU] via SUBCUTANEOUS

## 2012-05-01 MED ORDER — MAGIC MOUTHWASH W/LIDOCAINE
5.0000 mL | Freq: Four times a day (QID) | ORAL | Status: DC | PRN
  Administered 2012-05-01 (×3): 5 mL via ORAL
  Filled 2012-05-01 (×3): qty 5

## 2012-05-01 MED ORDER — INSULIN GLARGINE 100 UNIT/ML ~~LOC~~ SOLN
30.0000 [IU] | SUBCUTANEOUS | Status: AC
  Administered 2012-05-01: 30 [IU] via SUBCUTANEOUS

## 2012-05-01 MED ORDER — INSULIN PUMP
Freq: Three times a day (TID) | SUBCUTANEOUS | Status: DC
  Administered 2012-05-01: 1 via SUBCUTANEOUS
  Filled 2012-05-01: qty 1

## 2012-05-01 MED ORDER — SODIUM CHLORIDE 0.9 % IV SOLN
INTRAVENOUS | Status: DC
  Administered 2012-05-01: 12:00:00 via INTRAVENOUS

## 2012-05-01 MED ORDER — DEXTROSE 50 % IV SOLN: 25.0000 mL | INTRAVENOUS | Status: DC | PRN

## 2012-05-01 MED ORDER — INSULIN ASPART 100 UNIT/ML ~~LOC~~ SOLN
0.0000 [IU] | Freq: Three times a day (TID) | SUBCUTANEOUS | Status: DC
  Administered 2012-05-01: 9 [IU] via SUBCUTANEOUS

## 2012-05-01 MED ORDER — PANTOPRAZOLE SODIUM 40 MG PO TBEC
40.0000 mg | DELAYED_RELEASE_TABLET | Freq: Every day | ORAL | Status: DC
  Administered 2012-05-01: 40 mg via ORAL
  Filled 2012-05-01: qty 1

## 2012-05-01 MED ORDER — INSULIN ASPART 100 UNIT/ML ~~LOC~~ SOLN
20.0000 [IU] | SUBCUTANEOUS | Status: AC
  Administered 2012-05-01: 15 [IU] via SUBCUTANEOUS

## 2012-05-01 NOTE — Progress Notes (Signed)
Spoke with Gina,RN Diabetic Coordinator about CBG orders in chart. Patient very upset with increased blood sugar emotional support given mother at bedside. Will continue to monitor and educate pt.

## 2012-05-01 NOTE — Progress Notes (Signed)
Dr.McClung and Almira Coaster, RN paged several times about pt's blood sugar and when pt  refused insulin drip and took out insulin pump. MD and Almira Coaster, RN aware and came to bedside to speak with patient. Educated and new orders on chart. Emotional support given pt very upset with high blood sugar. Will continue to support, monitor, and inform patient of plan of care. Physician and diabetic coordinator have been in several times as well and have been very supportive , caring, and actively  listening to patient's needs and request . At this time patient is  in  A better mood . Family at bedside. Will continue to check blood sugar hourly .

## 2012-05-01 NOTE — ED Provider Notes (Signed)
Medical screening examination/treatment/procedure(s) were conducted as a shared visit with non-physician practitioner(s) and myself.  I personally evaluated the patient during the encounter  Cheri Guppy, MD 05/01/12 1128

## 2012-05-01 NOTE — Progress Notes (Addendum)
Inpatient Diabetes Program Recommendations  AACE/ADA: New Consensus Statement on Inpatient Glycemic Control (2009)  Target Ranges:  Prepandial:   less than 140 mg/dL      Peak postprandial:   less than 180 mg/dL (1-2 hours)      Critically ill patients:  140 - 180 mg/dL   Patient admitted with DKA from kinked insulin pump catheter.  Diabetes Coordinator spoke with patient concerning events leading up to admission.  Patient knows she is supposed to correct using vial/syringe but was out of insulin after refilling her pump the night before.  She reports that she had called in a refill to the pharmacy for the next day which was the day that she developed DKA.  Reminded patient to ALWAYS have extra insulin on hand in case of emergency.  She did notify Dr. Sharl Ma yesterday of the events.  She will follow up with him ASAP after discharge.  She is currently undergoing laser treatment for her retinopathy.  The patient is knowledgeable about her diabetes management and pump therapy.  Her A1C=10.1.  She will discuss with Dr. Sharl Ma at her next appointment.   The patient restarted her pump this morning.  We are closely monitoring her CBGs for improvement using her pump.  If no improvement, the patient will receive Lantus and Novolog and instructions to follow up with Dr. Daune Perch office.    Thank you  Piedad Climes RN,BSN,CDE Inpatient Diabetes Coordinator [phone]   ADD: 1600  Patient's CBGs continued to rise to 511 after 11 unit bolus given through pump.  Patient removed her site and the cannula was once again bent.  Patient reports the last 2 sets were from a new box.  She will use Lantus and Novolog for now and call minimed about the box of sets once she gets home and can see the lot #.  Her mother has gone to Dr. Daune Perch office to get samples of Lantus and Humalog to take home.  Patient has recently filled out the paperwork to be part of [place] [facility]' program.    G.Jazir Newey RN

## 2012-05-01 NOTE — Progress Notes (Signed)
Utilization review completed.  

## 2012-05-01 NOTE — Progress Notes (Signed)
I have reviewed diabetes in detail with the patient today. All questions have been answered to her satisfaction. We have discussed the following concepts: The diabetic Sick Day rules are reviewed with her verbally and in writing. If following usual diet, stay on same dose of diabetic medication, maintain high fluid intake, and perform home glucose monitoring QID. If not able to maintain normal diet due to illness glucometer readings QID until well and stable","maintain hydration with high fluid intake","call MD if glucose above 400}. Insulin: instructions given on proper technique of injection, storing medication, timing of dose. The patient was able to demonstrate adequate skill with doing self injections and home glucose monitoring. [name] 05/01/2012 6:54 PM. Discharge instructions and teaching done. Dr. Caren Hazy educated patient. Transported to private vehicle in wheelchair. Follow up appointment already made my patient.

## 2012-05-01 NOTE — Discharge Summary (Signed)
DISCHARGE SUMMARY  [name]  MR#: [id number]  DOB:1986/09/15  Date of Admission: 04/30/2012 Date of Discharge: 05/01/2012  Attending Physician:Kailo Kosik T  Patient's HYQ:MVHQION,GEXBMW TOM, MD  Consults:  none  Disposition: Discharge home  Follow-up Appts: Follow-up Information    Follow up with KERR,Altagracia Rone, MD. 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 female with poorly controlled type 1 diabetes mellitus with complications including retinopathy, neuropathy and gastroparesis presented to the emergency department with hyperglycemia. The patient is currently using an insulin pump. The patient reports that she had a site change the night prior to her admission. She reported that she went to bed and woke up 12 hours later. The patient reported that she got up several times in the middle of the night to urinate. She did not check her blood glucose at that time. It is unusual for her to urinate in the middle of the night. She woke up early the morning of her admission feeling very tired and had significant malaise and nausea. She checked her blood glucose and it was greater than 600.   Hospital Course:  DKA  An investigation has revealed the patient's insulin pump catheter was kinked - the patient has been advised on appropriate use of emergency insulin via syringe during times of uncontrolled CBGs - the patient is followed by Dr. Sharl Ma in  the outpatient setting - the patient's DKA resolved quickly with insulin via IV and with subsequent high doses of NovoLog and Lantus - the patient has new infusion catheter sets at home - she has also been provided with samples of rescue insulin via Dr. Daune Perch office prior to her discharge - she is highly educated on the management of her diabetes and request that we discharge her home - she is aware of the signs and symptoms of recurrent DKA and agrees to return to the emergency room for treatment should these symptoms appear  Uncontrolled diabetes mellitus type 1  See discussion above  Severe diabetic retinopathy  Status post laser surgery   Diabetic gastroparesis Controlled during hospital stay  Medication List  As of 05/01/2012  6:21 PM   TAKE these medications         albuterol 108 (90 BASE) MCG/ACT inhaler   Commonly known as: PROVENTIL HFA;VENTOLIN HFA   Inhale 2 puffs into the lungs every 6 (six) hours as needed. As needed for shortness of breath.      albuterol (5 MG/ML) 0.5% nebulizer solution   Commonly known as: PROVENTIL   Take 2.5 mg by nebulization every 6 (six) hours as needed. Shortness of breath or wheezing      budesonide 180 MCG/ACT inhaler   Commonly known as: PULMICORT   Inhale 1 puff into the lungs 2 (two) times daily.      glucagon 1 MG injection   Inject 1 mg into the vein once as needed. As needed for emergency to lower blood sugar.      insulin lispro 100 UNIT/ML injection   Commonly known as: HUMALOG   Inject into the skin 3 (three)  times daily before meals. Insulin pump : 31 base units 1 unit for every five grams she eats.      metoCLOPramide 10 MG tablet   Commonly known as: REGLAN   Take 10 mg by mouth 4 (four) times daily as needed. Gastroparesis.      multivitamin-lutein Caps   Take 1 capsule by mouth daily.      prenatal multivitamin Tabs   Take 1 tablet by mouth daily.           Day of Discharge BP 129/79  Pulse 87  Temp 98.5 F  (36.9 C) (Oral)  Resp 21  Ht 5\' 6"  (1.676 m)  Wt 86.002 kg (189 lb 9.6 oz)  BMI 30.60 kg/m2  SpO2 99%  LMP 04/30/2012  Physical Exam: General: No acute respiratory distress Lungs: Clear to auscultation bilaterally without wheezes or crackles Cardiovascular: Regular rate and rhythm without murmur gallop or rub  Abdomen: Nontender, nondistended, soft, bowel sounds positive, no rebound, no ascites, no appreciable mass Extremities: No significant cyanosis, clubbing, or edema bilateral lower extremities  BASIC METABOLIC PANEL     Status: Abnormal   Collection Time   05/01/12  3:07 PM      Component Value Range   Sodium 132 (*) 135 - 145 mEq/L   Potassium 4.3  3.5 - 5.1 mEq/L   Chloride 99  96 - 112 mEq/L   CO2 18 (*) 19 - 32 mEq/L   Glucose, Bld 490 (*) 70 - 99 mg/dL   BUN 21  6 - 23 mg/dL   Creatinine, Ser 1.61  0.50 - 1.10 mg/dL   Calcium 8.5  8.4 - 09.6 mg/dL   GFR calc non Af Amer 85 (*) >90 mL/min   GFR calc Af Amer >90  >90 mL/min  CBC     Status: Abnormal   Collection Time   05/01/12  3:07 PM      Component Value Range   WBC 8.9  4.0 - 10.5 K/uL   RBC 4.11  3.87 - 5.11 MIL/uL   Hemoglobin 11.4 (*) 12.0 - 15.0 g/dL   HCT 04.5 (*) 40.9 - 81.1 %   MCV 83.0  78.0 - 100.0 fL   MCH 27.7  26.0 - 34.0 pg   MCHC 33.4  30.0 - 36.0 g/dL   RDW 91.4  78.2 - 95.6 %   Platelets 269  150 - 400 K/uL  GLUCOSE, CAPILLARY     Status: Abnormal   Collection Time   05/01/12  6:00 PM      Component Value Range   Glucose-Capillary 337 (*) 70 - 99 mg/dL   Time spent in discharge (includes decision making & examination of pt): 30 minutes  05/01/2012, 6:21 PM   Lonia Blood, MD [facility] Office  [phone] Pager [phone]  On-Call/Text Page:      [email or web address]      password [facility]

## 2012-06-07 DIAGNOSIS — L309 Dermatitis, unspecified: Secondary | ICD-10-CM | POA: Insufficient documentation

## 2012-06-07 DIAGNOSIS — D638 Anemia in other chronic diseases classified elsewhere: Secondary | ICD-10-CM

## 2012-06-07 DIAGNOSIS — D649 Anemia, unspecified: Secondary | ICD-10-CM | POA: Insufficient documentation

## 2012-06-07 DIAGNOSIS — N200 Calculus of kidney: Secondary | ICD-10-CM | POA: Insufficient documentation

## 2012-06-07 HISTORY — DX: Anemia in other chronic diseases classified elsewhere: D63.8

## 2012-06-11 DIAGNOSIS — H3342 Traction detachment of retina, left eye: Secondary | ICD-10-CM | POA: Insufficient documentation

## 2012-08-07 ENCOUNTER — Ambulatory Visit
Admission: RE | Admit: 2012-08-07 | Discharge: 2012-08-07 | Disposition: A | Discharge status: Home / Self Care | Payer: Medicaid Other | Source: Ambulatory Visit | Attending: Family Medicine | Admitting: Family Medicine

## 2012-08-07 ENCOUNTER — Other Ambulatory Visit: Payer: Self-pay | Admitting: Family Medicine

## 2012-08-07 DIAGNOSIS — M25551 Pain in right hip: Secondary | ICD-10-CM

## 2012-09-09 ENCOUNTER — Ambulatory Visit (HOSPITAL_COMMUNITY)
Admission: RE | Admit: 2012-09-09 | Discharge: 2012-09-09 | Disposition: A | Discharge status: Home / Self Care | Payer: Medicaid Other | Source: Ambulatory Visit | Attending: Family Medicine | Admitting: Family Medicine

## 2012-09-09 DIAGNOSIS — IMO0001 Reserved for inherently not codable concepts without codable children: Secondary | ICD-10-CM | POA: Insufficient documentation

## 2012-09-09 DIAGNOSIS — E119 Type 2 diabetes mellitus without complications: Secondary | ICD-10-CM | POA: Insufficient documentation

## 2012-09-09 DIAGNOSIS — M25559 Pain in unspecified hip: Secondary | ICD-10-CM | POA: Insufficient documentation

## 2012-09-09 DIAGNOSIS — M25569 Pain in unspecified knee: Secondary | ICD-10-CM | POA: Insufficient documentation

## 2012-09-09 NOTE — Evaluation (Cosign Needed Addendum)
Physical Therapy Evaluation  Patient Details  Name: [name] MRN: [id number] Date of Birth: 11/13/1985  Today's Date: 09/09/2012 Time: 1310-1400 PT Time Calculation (min): 50 min  Visit#: 1  of 4   Re-eval:   Assessment Diagnosis: R hip pain Next MD Visit: 09/27/2012  Authorization: medicaid  Authorization Visit#: 1  of 4    Past Medical History:  Past Medical History  Diagnosis Date  . Asthma   . Tachycardia     baseline tachycardia   . Retinopathy due to secondary diabetes   . Diabetic gastroparesis   . 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 Diabetic neuropathy, type I diabetes mellitus   . Polyneuropathy in diabetes    Past Surgical History:  Past Surgical History  Procedure Date  . Cholecystectomy   . Cesarean section   . Refractive surgery     Subjective Symptoms/Limitations Symptoms: Pt states that she has had right hip pain for about a month now.  She states the pain is only with certain positions. She states that pain is increasing in severity and occurence. The patient is now being referred to PT.   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, site Leisure: Hobbies-yes (Comment) Comments: 35 month old daughter  Cognition/Observation Cognition Overall Cognitive  Status: Appears within functional limits for tasks assessed Observation/Other Assessments Observations: R SI jt outflared Other Assessments: tender to superior R inguinal ligament     Assessment RLE AROM (degrees) RLE Overall AROM Comments: wnl except hip abduction decreased  RLE Strength Right Hip Flexion: 5/5 Right Hip Extension: 4/5 Right Hip ABduction: 4/5 Right Hip ADduction: 5/5 Right Knee Flexion: 5/5 Right Knee Extension: 5/5  Exercise/Treatments   Supine Other Supine Knee Exercises: long sit hip addcutor stretch 30x 3 Sidelying Hip ABduction: Strengthening;Right;10 reps Prone  Hip Extension: Strengthening;10 reps   Modalities Modalities: Ultrasound Manual Therapy Manual Therapy: Other (comment) Other Manual Therapy: mm energy techniques to correct outflare Ultrasound Ultrasound Location: superior inguinal ligament Ultrasound Parameters: 1.5 w/cm2 3 mgHZ Ultrasound Goals: Pain  Physical Therapy Assessment and Plan PT Assessment and Plan Clinical Impression Statement: Pt with chronic R groin pain with no injury or trauma.  Pt demonstrates an outlfare of her SI jt Pt will benefit from skilled therapeutic intervention in order to improve on the following deficits: Pain;Decreased strength Rehab Potential: Good PT Frequency: Min 2X/week PT Duration:  (2 weeks) PT Treatment/Interventions: Manual techniques;Modalities;Therapeutic exercise PT Plan: begin hip flexor stretch next treatment.    Goals Home Exercise Program Pt will Perform Home Exercise Program: Independently PT Short Term Goals Time to Complete Short Term Goals: 2 weeks PT Short Term Goal 1: Pain no greater than a 5/10  Problem List Patient Active Problem List  Diagnosis  . DIABETES MELLITUS, TYPE I, UNCONTROLLED, WITH KETOACIDOSIS  . ATRIAL TACHYCARDIA  .  PLEURAL EFFUSION  . Gastroparesis  . BOWEL INTUSSUSCEPTION  . CHOLELITHIASIS  . NAUSEA  . OTHER ABNORMALITY OF URINATION  . ABDOMINAL  PAIN, LEFT UPPER QUADRANT  . LIVER FUNCTION TESTS, ABNORMAL, HX OF  . PANCREATITIS, ACUTE, HX OF  . RENAL FAILURE, ACUTE, HX OF  . Hyperglycemia  . Diabetic retinopathy associated with type 1 diabetes mellitus  . Polyneuropathy in diabetes  . H/O insertion of insulin pump    PT - End of Session Activity Tolerance: Patient tolerated treatment well General Behavior During Session: [facility] for tasks performed Cognition: [facility] for tasks performed PT Plan of Care PT Home Exercise Plan: given  GP    Umeka Wrench,CINDY 09/09/2012, 2:08 PM  Physician Documentation Your signature is required to indicate approval of the treatment plan as stated above.  Please sign and either send electronically or make a copy of this report for your files and return this physician signed original.   Please mark one 1.__approve of plan  2. ___approve of plan with the following conditions.   ______________________________                                                          _____________________ Physician Signature                                                                                                             Date

## 2012-09-12 ENCOUNTER — Ambulatory Visit (HOSPITAL_COMMUNITY)
Admission: RE | Admit: 2012-09-12 | Discharge: 2012-09-12 | Disposition: A | Discharge status: Home / Self Care | Payer: Medicaid Other | Source: Ambulatory Visit | Attending: Family Medicine | Admitting: Family Medicine

## 2012-09-12 NOTE — Progress Notes (Signed)
Physical Therapy Treatment Patient Details  Name: [name] MRN: [id number] Date of Birth: December 24, 1985  Today's Date: 09/12/2012 Time: 0805-0900 PT Time Calculation (min): 55 min Visit#: 2  of 4   Authorization: medicaid  Authorization Visit#: 2  of 4   Charges:  therex 26', manual 8', [name] 8'  Subjective: Symptoms/Limitations Symptoms: Pt. reports her pain is about the same, all pain mostly in the R hip into groin area rates 1/10 at rest but goes up to as high as 10/10 with movement. Pain Assessment Currently in Pain?: Yes Pain Score:   1 Pain Location: Hip Pain Orientation: Right   Exercise/Treatments Supine Other Supine Knee Exercises: Hip flexor stretch R 2X1'holds Other Supine Knee Exercises: R hip adductor stretch 3X30" Sidelying Hip ABduction: 15 reps;Right Prone  Hip Extension: 15 reps;Right   Modalities Modalities: Ultrasound Manual Therapy Other Manual Therapy: MET to correct L anterior rotation in supine Ultrasound Ultrasound Location: Superior inguinal ligament on Right Ultrasound Parameters: 1.5 w/cm2 3 mgHZ  Ultrasound Goals: Pain  Physical Therapy Assessment and Plan PT Assessment and Plan Clinical Impression Statement: SI assessed by Becky Sax, PTA and performed MET for L anterior rotation.  Pt. voiced not much improvement/difference following MET./  Added supine hip flexor stretch with good stretch obtained.  Pt. able to perform all exercises correctly, however required manual cues for side lying hip abduction to isolate hip abductor. PT Plan: Continue to progress per POC.      Problem List Patient Active Problem List  Diagnosis  . DIABETES MELLITUS, TYPE I, UNCONTROLLED, WITH KETOACIDOSIS  . ATRIAL TACHYCARDIA  . PLEURAL EFFUSION  . Gastroparesis  . BOWEL INTUSSUSCEPTION  . CHOLELITHIASIS  . NAUSEA  . OTHER ABNORMALITY OF URINATION  . ABDOMINAL PAIN, LEFT UPPER QUADRANT  . LIVER FUNCTION TESTS, ABNORMAL, HX OF  . PANCREATITIS,  ACUTE, HX OF  . RENAL FAILURE, ACUTE, HX OF  . Hyperglycemia  . Diabetic retinopathy associated with type 1 diabetes mellitus  . Polyneuropathy in diabetes  . H/O insertion of insulin pump    PT - End of Session Equipment Utilized During Treatment: Gait belt Activity Tolerance: Patient tolerated treatment well General Behavior During Session: [facility] for tasks performed Cognition: [facility] for tasks performed    Lurena Nida, PTA/CLT 09/12/2012, 9:12 AM

## 2012-09-14 DIAGNOSIS — H2701 Aphakia, right eye: Secondary | ICD-10-CM | POA: Insufficient documentation

## 2012-09-16 ENCOUNTER — Ambulatory Visit (HOSPITAL_COMMUNITY)
Admission: RE | Admit: 2012-09-16 | Discharge: 2012-09-16 | Disposition: A | Discharge status: Home / Self Care | Payer: Medicaid Other | Source: Ambulatory Visit | Attending: Family Medicine | Admitting: Family Medicine

## 2012-09-16 NOTE — Progress Notes (Signed)
Physical Therapy Treatment Patient Details  Name: [name] MRN: [id number] Date of Birth: 1986-01-11  Today's Date: 09/16/2012 Time: 4098-1191 PT Time Calculation (min): 43 min Visit#: 3  of 4   Authorization: medicaid  Authorization Visit#: 3  of 4   Charges:  [name] 8', therex 26'  Subjective: Symptoms/Limitations Symptoms: Pt. states pain remains into R groin area, currently 1/10.  Just had eye surgery on Friday and cannot lay supine. Pain Assessment Currently in Pain?: Yes Pain Score:   1 Pain Location: Hip Pain Orientation: Right   Exercise/Treatments Supine Other Supine Knee Exercises: standing Hip flexor stretch R 3X30" Other Supine Knee Exercises: semi-reclined hip add stretch 3X30" Sidelying Hip ABduction: 15 reps;Right Prone  Hip Extension: 15 reps;Right     Physical Therapy Assessment and Plan PT Assessment and Plan Clinical Impression Statement: Pt. with R anterior rotation today, MET performed with good results and f/b treadmill.  Pt. voiced improvement today following technique.  Performed hip flexor stretch in standing/hip add semireclined due to pt. unable to lay supine today.  Very sensitive in R groin area with [name], but voiced no pain at end of session. PT Plan: Re-evaluate next visit.     Problem List Patient Active Problem List  Diagnosis  . DIABETES MELLITUS, TYPE I, UNCONTROLLED, WITH KETOACIDOSIS  . ATRIAL TACHYCARDIA  . PLEURAL EFFUSION  . Gastroparesis  . BOWEL INTUSSUSCEPTION  . CHOLELITHIASIS  . NAUSEA  . OTHER ABNORMALITY OF URINATION  . ABDOMINAL PAIN, LEFT UPPER QUADRANT  . LIVER FUNCTION TESTS, ABNORMAL, HX OF  . PANCREATITIS, ACUTE, HX OF  . RENAL FAILURE, ACUTE, HX OF  . Hyperglycemia  . Diabetic retinopathy associated with type 1 diabetes mellitus  . Polyneuropathy in diabetes  . H/O insertion of insulin pump    PT - End of Session Equipment Utilized During Treatment: Gait belt Activity Tolerance: Patient tolerated  treatment well General Behavior During Session: [facility] for tasks performed Cognition: [facility] for tasks performed   Lurena Nida, PTA/CLT 09/16/2012, 2:08 PM

## 2012-09-19 ENCOUNTER — Ambulatory Visit (HOSPITAL_COMMUNITY)
Admission: RE | Admit: 2012-09-19 | Discharge: 2012-09-19 | Disposition: A | Discharge status: Home / Self Care | Payer: Medicaid Other | Source: Ambulatory Visit | Attending: Family Medicine | Admitting: Family Medicine

## 2012-09-19 ENCOUNTER — Ambulatory Visit (HOSPITAL_COMMUNITY): Payer: Medicaid Other | Admitting: Physical Therapy

## 2012-09-19 NOTE — Evaluation (Signed)
Physical Therapy Re-Evaluation  Patient Details  Name: [name] MRN: [id number] Date of Birth: 1986/03/09  Today's Date: 09/19/2012 Time: 4098-1191 PT Time Calculation (min): 30 min  Visit#: 4  of 4   Assessment Diagnosis: R hip pain Next MD Visit: 09/27/2012  Authorization: Medicaid  Past Medical History:  Past Medical History  Diagnosis Date  . Asthma   . Tachycardia     baseline tachycardia   . Retinopathy due to secondary diabetes   . Diabetic gastroparesis   . 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 Diabetic neuropathy, type I diabetes mellitus   . Polyneuropathy in diabetes    Past Surgical History:  Past Surgical History  Procedure Date  . Cholecystectomy   . Cesarean section   . 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, site Leisure: Hobbies-yes (Comment)  Cognition/Observation Cognition Overall Cognitive Status: Appears within functional limits for tasks assessed Observation/Other Assessments Observations: R SI jt outflared Other Assessments: tender to superior R inguinal ligament    Assessment RLE AROM (degrees) RLE Overall AROM Comments: wnl except hip abduction decreased  RLE Strength Right Hip Flexion: 5/5 Right Hip Extension: 4/5 Right Hip ABduction: 4/5 (4+/5 was 4/5) Right Hip ADduction: 5/5 Right Knee Flexion: 5/5 Right Knee Extension: 5/5  Exercise/Treatments TM x 12 minutes at 2.0 with verbal cuing to keep abdominal contracted; Bridging x10; hip adduction x 10 x2 Manual Therapy Manual Therapy: Other  (comment) Other Manual Therapy: MET to correct L anterior rotation in supine before and after TM  Physical Therapy Assessment and Plan PT Assessment and Plan Clinical Impression Statement: Goals met except for full strength but has only been two weeks.  Pt I in HEP  PT Plan: D/C to HEP    Goals Home Exercise Program PT Goal: Perform Home Exercise Program - Progress: Goal set today PT Short Term Goals PT Short Term Goal 1 - Progress: Met  Problem List Patient Active Problem List  Diagnosis  . DIABETES MELLITUS, TYPE I, UNCONTROLLED, WITH KETOACIDOSIS  . ATRIAL TACHYCARDIA  . PLEURAL EFFUSION  . Gastroparesis  . BOWEL INTUSSUSCEPTION  . CHOLELITHIASIS  . NAUSEA  . OTHER ABNORMALITY OF URINATION  . ABDOMINAL PAIN, LEFT UPPER QUADRANT  . LIVER FUNCTION TESTS, ABNORMAL, HX OF  . PANCREATITIS, ACUTE, HX OF  . RENAL FAILURE, ACUTE, HX OF  . Hyperglycemia  . Diabetic retinopathy associated with type 1 diabetes mellitus  . Polyneuropathy in diabetes  . H/O insertion of insulin pump    PT - End of Session Activity Tolerance: Patient tolerated treatment well General Behavior During Session: [facility] for tasks performed Cognition: [facility] for tasks performed PT Plan of Care PT Home Exercise Plan: given  GP    Kaitlyne Friedhoff,CINDY 09/19/2012, 1:37 PM  Physician Documentation Your signature is required to indicate approval of the treatment plan as stated above.  Please sign and either send electronically or make a copy of this report for your files and return this physician signed original.   Please mark one 1.__approve of plan  2. ___approve of plan with the following conditions.   ______________________________  _____________________ Physician Signature                                                                                                             Date

## 2012-12-09 ENCOUNTER — Ambulatory Visit
Admission: RE | Admit: 2012-12-09 | Discharge: 2012-12-09 | Disposition: A | Discharge status: Home / Self Care | Payer: Medicaid Other | Source: Ambulatory Visit | Attending: Pediatrics | Admitting: Pediatrics

## 2012-12-09 ENCOUNTER — Telehealth: Payer: Self-pay | Admitting: Pediatrics

## 2012-12-09 DIAGNOSIS — M79671 Pain in right foot: Secondary | ICD-10-CM

## 2012-12-09 NOTE — Telephone Encounter (Signed)
Has hurt right foot and in pain for past 2 weeks. Will get xray. She will discuss with her primary care.

## 2013-01-03 ENCOUNTER — Telehealth: Payer: Self-pay | Admitting: Family Medicine

## 2013-01-03 MED ORDER — ALBUTEROL SULFATE HFA 108 (90 BASE) MCG/ACT IN AERS: 2.0000 | INHALATION_SPRAY | Freq: Four times a day (QID) | RESPIRATORY_TRACT | Status: DC | PRN

## 2013-01-03 NOTE — Telephone Encounter (Signed)
rx refilled.

## 2013-01-13 ENCOUNTER — Telehealth: Payer: Self-pay | Admitting: Family Medicine

## 2013-01-13 MED ORDER — AZITHROMYCIN 250 MG PO TABS: ORAL_TABLET | ORAL | Status: DC

## 2013-01-15 NOTE — Telephone Encounter (Signed)
Med sent to pharmacy on 01/13/13

## 2013-01-20 ENCOUNTER — Encounter (HOSPITAL_COMMUNITY): Payer: Self-pay | Admitting: Emergency Medicine

## 2013-01-20 ENCOUNTER — Emergency Department (HOSPITAL_COMMUNITY)
Admission: EM | Admit: 2013-01-20 | Discharge: 2013-01-20 | Disposition: A | Discharge status: Home / Self Care | Payer: Medicaid Other | Attending: Emergency Medicine | Admitting: Emergency Medicine

## 2013-01-20 DIAGNOSIS — R110 Nausea: Secondary | ICD-10-CM | POA: Insufficient documentation

## 2013-01-20 DIAGNOSIS — J029 Acute pharyngitis, unspecified: Secondary | ICD-10-CM

## 2013-01-20 DIAGNOSIS — Z794 Long term (current) use of insulin: Secondary | ICD-10-CM | POA: Insufficient documentation

## 2013-01-20 DIAGNOSIS — Z79899 Other long term (current) drug therapy: Secondary | ICD-10-CM | POA: Insufficient documentation

## 2013-01-20 DIAGNOSIS — R51 Headache: Secondary | ICD-10-CM | POA: Insufficient documentation

## 2013-01-20 DIAGNOSIS — E109 Type 1 diabetes mellitus without complications: Secondary | ICD-10-CM | POA: Insufficient documentation

## 2013-01-20 DIAGNOSIS — R42 Dizziness and giddiness: Secondary | ICD-10-CM | POA: Insufficient documentation

## 2013-01-20 DIAGNOSIS — E860 Dehydration: Secondary | ICD-10-CM

## 2013-01-20 DIAGNOSIS — R000 Tachycardia, unspecified: Secondary | ICD-10-CM | POA: Insufficient documentation

## 2013-01-20 DIAGNOSIS — Z8669 Personal history of other diseases of the nervous system and sense organs: Secondary | ICD-10-CM | POA: Insufficient documentation

## 2013-01-20 DIAGNOSIS — Z8719 Personal history of other diseases of the digestive system: Secondary | ICD-10-CM | POA: Insufficient documentation

## 2013-01-20 DIAGNOSIS — J45909 Unspecified asthma, uncomplicated: Secondary | ICD-10-CM | POA: Insufficient documentation

## 2013-01-20 LAB — CBC WITH DIFFERENTIAL/PLATELET
Basophils Absolute: 0.1 10*3/uL (ref 0.0–0.1)
Basophils Relative: 1 % (ref 0–1)
Eosinophils Absolute: 0.4 10*3/uL (ref 0.0–0.7)
Eosinophils Relative: 5 % (ref 0–5)
Lymphocytes Relative: 28 % (ref 12–46)
MCHC: 34.2 g/dL (ref 30.0–36.0)
MCV: 82.4 fL (ref 78.0–100.0)
Platelets: 340 10*3/uL (ref 150–400)
RDW: 12.9 % (ref 11.5–15.5)
WBC: 7.1 10*3/uL (ref 4.0–10.5)

## 2013-01-20 LAB — GLUCOSE, CAPILLARY: Glucose-Capillary: 58 mg/dL — ABNORMAL LOW (ref 70–99)

## 2013-01-20 LAB — COMPREHENSIVE METABOLIC PANEL
ALT: 11 U/L (ref 0–35)
AST: 13 U/L (ref 0–37)
Albumin: 3.3 g/dL — ABNORMAL LOW (ref 3.5–5.2)
CO2: 28 mEq/L (ref 19–32)
Calcium: 9.4 mg/dL (ref 8.4–10.5)
Creatinine, Ser: 0.89 mg/dL (ref 0.50–1.10)
Sodium: 139 mEq/L (ref 135–145)
Total Protein: 7 g/dL (ref 6.0–8.3)

## 2013-01-20 LAB — TROPONIN I: Troponin I: <0.3 ng/mL (ref <0.30)

## 2013-01-20 LAB — TSH: TSH: 1.015 u[IU]/mL (ref 0.350–4.500)

## 2013-01-20 LAB — T4, FREE: Free T4: 1.23 ng/dL (ref 0.80–1.80)

## 2013-01-20 LAB — RAPID STREP SCREEN (MED CTR MEBANE ONLY): Streptococcus, Group A Screen (Direct): NEGATIVE

## 2013-01-20 MED ORDER — SODIUM CHLORIDE 0.9 % IV SOLN
1000.0000 mL | Freq: Once | INTRAVENOUS | Status: AC
Start: 2013-01-20 — End: 2013-01-20
  Administered 2013-01-20: 1000 mL via INTRAVENOUS

## 2013-01-20 MED ORDER — CLARITHROMYCIN 500 MG PO TABS: 500.0000 mg | ORAL_TABLET | Freq: Two times a day (BID) | ORAL | Status: DC

## 2013-01-20 MED ORDER — SODIUM CHLORIDE 0.9 % IV SOLN
1000.0000 mL | INTRAVENOUS | Status: DC
  Administered 2013-01-20 (×2): 1000 mL via INTRAVENOUS

## 2013-01-20 MED ORDER — METOPROLOL TARTRATE 25 MG PO TABS: ORAL_TABLET | ORAL | Status: DC

## 2013-01-20 MED ORDER — SODIUM CHLORIDE 0.9 % IV BOLUS (SEPSIS)
500.0000 mL | Freq: Once | INTRAVENOUS | Status: AC
  Administered 2013-01-20: 11:00:00 via INTRAVENOUS

## 2013-01-20 NOTE — ED Notes (Signed)
Pt here for c/o chest pain first became dizziness and heart started racing denies syncope, c/o weakness

## 2013-01-20 NOTE — ED Notes (Signed)
Patient given apple juice and peanut butter and crackers.

## 2013-01-20 NOTE — ED Provider Notes (Signed)
History     CSN: [id number]  Arrival date & time 01/20/13  0805   First MD Initiated Contact with Patient 01/20/13 0809      Chief Complaint  Patient presents with  . Chest Pain  . Dizziness    (Consider location/radiation/quality/duration/timing/severity/associated sxs/prior treatment) HPI  Patient reports at 7 AM she was getting ready for work and she acutely felt like her heart was racing. She states it feels worse when she stands up. She also had a left-sided sharp chest pressure feeling and she felt like she was going to pass out. She reports her CBG this morning was 81. She reports she had an episode last week at work and states she felt like her blood pressure was high because she felt flushed. Her blood pressure is 160/101 and her heart rate was 120. She states she laid down and away. She reports she had tachycardia in the past about 18-10 years ago and was on Coreg. However her symptoms improved and she stopped taking the medication. She has only had these 2 episodes in the past week. She reports she did have some nausea and some headache today. She denies vomiting, diaphoresis, or shortness of breath. She reports her mother has a history of SVT and takes metoprolol.  Patient denies cough, rhinorrhea, sore throat (patient did have recent strep throat the patient works in a pediatrician's office) vomiting, diarrhea, fever. She otherwise has been feeling well.  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 She has gastroparesis and takes Reglan.  PCP Dr Tanya Nones Cardiology Dr Elease Hashimoto  Past Medical History  Diagnosis Date  . Asthma   . Tachycardia     baseline tachycardia   . Retinopathy due to secondary diabetes   . Diabetic gastroparesis   . 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 Diabetic neuropathy, type I diabetes mellitus   . Polyneuropathy in diabetes(357.2)     Past Surgical History   Procedure Laterality Date  . Cholecystectomy    . Cesarean section    . Refractive surgery      Family History  Problem Relation Age of Onset  . Hypertension     mother of patient and father of patient have hypertension Paternal grandmother died with strokes Paternal  grandfather had psoriasis of the liver Mother of patient has SVT  History  Substance Use Topics  . Smoking status: Never Smoker   . Smokeless tobacco: Not on file  . Alcohol Use: No  employed  OB History   Grav Para Term Preterm Abortions TAB SAB Ect Mult Living                  Review of Systems  All other systems reviewed and are negative.    Allergies  Ceftin  Home Medications   Current Outpatient Rx  Name  Route  Sig  Dispense  Refill  . albuterol (PROVENTIL HFA;VENTOLIN HFA) 108 (90 BASE) MCG/ACT inhaler   Inhalation   Inhale 2 puffs into the lungs every 6 (six) hours as needed. As needed for shortness of breath.   1 Inhaler   11   . budesonide (PULMICORT) 180 MCG/ACT inhaler   Inhalation   Inhale 1 puff into the lungs 2 (two) times daily.         . insulin lispro (HUMALOG) 100 UNIT/ML injection   Continuous infusion (non-IV)   by Continuous infusion (non-IV) route 3 (three)  times daily before meals. Insulin pump : 31 base units 1 unit for every five grams she eats.         Marland Kitchen lisinopril (PRINIVIL,ZESTRIL) 10 MG tablet   Oral   Take 10 mg by mouth every morning.         . montelukast (SINGULAIR) 10 MG tablet   Oral   Take 10 mg by mouth every morning.         . Prenatal Vit-Fe Fumarate-FA (PRENATAL MULTIVITAMIN) TABS   Oral   Take 1 tablet by mouth daily.             BP 134/87  Pulse 93  Temp(Src) 97.6 F (36.4 C) (Oral)  Resp 24  SpO2 99%  Vital signs normal   Orthostatic VS heart rate went from 86 lying to 124 on standing, blood pressure went from 124 systolic lying to 111 standing  Physical Exam  Nursing note and vitals reviewed. Constitutional: She is  oriented to person, place, and time. She appears well-developed and well-nourished.  Non-toxic appearance. She does not appear ill. No distress.  HENT:  Head: Normocephalic and atraumatic.  Right Ear: External ear normal.  Left Ear: External ear normal.  Nose: Nose normal. No mucosal edema or rhinorrhea.  Mouth/Throat: Oropharynx is clear and moist and mucous membranes are normal. No dental abscesses or edematous.  Eyes: Conjunctivae and EOM are normal. Pupils are equal, round, and reactive to light.  Neck: Normal range of motion and full passive range of motion without pain. Neck supple.  Cardiovascular: Normal rate, regular rhythm and normal heart sounds.  Exam reveals no gallop and no friction rub.   No murmur heard. Pulmonary/Chest: Effort normal and breath sounds normal. No respiratory distress. She has no wheezes. She has no rhonchi. She has no rales. She exhibits no tenderness and no crepitus.  After patient sat up for her lung exam her heart rate did pop up to 103. Patient felt her heart racing.  Abdominal: Soft. Normal appearance and bowel sounds are normal. She exhibits no distension. There is no tenderness. There is no rebound and no guarding.  Musculoskeletal: Normal range of motion. She exhibits no edema and no tenderness.  Moves all extremities well.   Neurological: She is alert and oriented to person, place, and time. She has normal strength. No cranial nerve deficit.  Skin: Skin is warm, dry and intact. No rash noted. No erythema. No pallor.  Psychiatric: She has a normal mood and affect. Her speech is normal and behavior is normal. Her mood appears not anxious.    ED Course  Procedures (including critical care time)  Medications  0.9 %  sodium chloride infusion (0 mLs Intravenous Stopped 01/20/13 1307)    Followed by  0.9 %  sodium chloride infusion (0 mLs Intravenous Stopped 01/20/13 1308)  sodium chloride 0.9 % bolus 500 mL (0 mLs Intravenous Stopped 01/20/13 1308)    Recheck 10:15 feeling better, heart rate in high 80's. States she started having a sore throat again 3 days ago and hasn't been eating or drinking as much as usual. Will check strept.   13:50 repeat orthostatics are improved. Patient does not want to get further IV fluids in the ED. She states she can drink at home. Patient relates that in October and 6 weeks ago she had a negative rapid strep tests but her cultures came back positive. She was treated with Levaquin in the fall and amoxicillin most recently. We discussed using beta blockers as  needed for tachycardia however to be careful because it will mask her symptoms of hypoglycemia. Patient understands. She is going to followup with Dr. Elease Hashimoto, whom she has seen in the past for tachycardia  Results for orders placed during the hospital encounter of 01/20/13  RAPID STREP SCREEN      Result Value Range   Streptococcus, Group A Screen (Direct) NEGATIVE  NEGATIVE  CBC WITH DIFFERENTIAL      Result Value Range   WBC 7.1  4.0 - 10.5 K/uL   RBC 5.00  3.87 - 5.11 MIL/uL   Hemoglobin 14.1  12.0 - 15.0 g/dL   HCT 16.1  09.6 - 04.5 %   MCV 82.4  78.0 - 100.0 fL   MCH 28.2  26.0 - 34.0 pg   MCHC 34.2  30.0 - 36.0 g/dL   RDW 40.9  81.1 - 91.4 %   Platelets 340  150 - 400 K/uL   Neutrophils Relative 57  43 - 77 %   Neutro Abs 4.1  1.7 - 7.7 K/uL   Lymphocytes Relative 28  12 - 46 %   Lymphs Abs 2.0  0.7 - 4.0 K/uL   Monocytes Relative 9  3 - 12 %   Monocytes Absolute 0.6  0.1 - 1.0 K/uL   Eosinophils Relative 5  0 - 5 %   Eosinophils Absolute 0.4  0.0 - 0.7 K/uL   Basophils Relative 1  0 - 1 %   Basophils Absolute 0.1  0.0 - 0.1 K/uL  COMPREHENSIVE METABOLIC PANEL      Result Value Range   Sodium 139  135 - 145 mEq/L   Potassium 4.4  3.5 - 5.1 mEq/L   Chloride 102  96 - 112 mEq/L   CO2 28  19 - 32 mEq/L   Glucose, Bld 107 (*) 70 - 99 mg/dL   BUN 17  6 - 23 mg/dL   Creatinine, Ser 7.82  0.50 - 1.10 mg/dL   Calcium 9.4  8.4 - 95.6 mg/dL    Total Protein 7.0  6.0 - 8.3 g/dL   Albumin 3.3 (*) 3.5 - 5.2 g/dL   AST 13  0 - 37 U/L   ALT 11  0 - 35 U/L   Alkaline Phosphatase 121 (*) 39 - 117 U/L   Total Bilirubin 0.3  0.3 - 1.2 mg/dL   GFR calc non Af Amer 87 (*) >90 mL/min   GFR calc Af Amer >90  >90 mL/min  MAGNESIUM      Result Value Range   Magnesium 2.0  1.5 - 2.5 mg/dL  TROPONIN I      Result Value Range   Troponin I <0.30  <0.30 ng/mL  TSH      Result Value Range   TSH 1.015  0.350 - 4.500 uIU/mL  T4, FREE      Result Value Range   Free T4 1.23  0.80 - 1.80 ng/dL  GLUCOSE, CAPILLARY      Result Value Range   Glucose-Capillary 58 (*) 70 - 99 mg/dL   Comment 1 Documented in Chart     Comment 2 Notify RN     Laboratory interpretation all normal        Date: 01/20/2013  Rate: 93  Rhythm: normal sinus rhythm  QRS Axis: normal  Intervals: normal  ST/T Wave abnormalities: normal  Conduction Disutrbances:none  Narrative Interpretation:   Old EKG Reviewed: changes noted from 04/30/2012 HR was 131    1. Dehydration  2. Tachycardia   3. Sore throat    New Prescriptions   CLARITHROMYCIN (BIAXIN) 500 MG TABLET    Take 1 tablet (500 mg total) by mouth 2 (two) times daily.   METOPROLOL TARTRATE (LOPRESSOR) 25 MG TABLET    Take 1 prn heart racing for more than 20 minutes    Plan discharge    Devoria Albe, MD, FACEP    MDM patient presents with tachycardia at which appeared to be orthostatic in nature. She has had a sore throat and has not been able to even drink well and her tachycardia improved with IV fluids. She has had tachycardia in the past but has not been on medication for several years. I am cautious to start her on beta blockers because she has diabetes and it will blunt her assessment of feeling hypoglycemic.           Ward Givens, MD 01/20/13 [phone]

## 2013-02-21 ENCOUNTER — Ambulatory Visit (INDEPENDENT_AMBULATORY_CARE_PROVIDER_SITE_OTHER): Payer: Medicaid Other | Admitting: Family Medicine

## 2013-02-21 ENCOUNTER — Encounter: Payer: Self-pay | Admitting: Family Medicine

## 2013-02-21 VITALS — BP 130/80 | HR 78 | Temp 98.1°F | Resp 16 | Wt 192.0 lb

## 2013-02-21 DIAGNOSIS — J3501 Chronic tonsillitis: Secondary | ICD-10-CM

## 2013-02-21 MED ORDER — AMOXICILLIN-POT CLAVULANATE 875-125 MG PO TABS: 1.0000 | ORAL_TABLET | Freq: Two times a day (BID) | ORAL | Status: DC

## 2013-02-21 NOTE — Progress Notes (Signed)
  Subjective:    Patient ID: [name], female    DOB: 09-02-1986, 27 y.o.   MRN: [id number]  HPI In the last 2 months, the patient has been treated for tonsillitis on 5 separate occasions. She has tried amoxicillin. She has tried clindamycin. She's tried Biaxin. She is currently on Augmentin. She states that she'll improve on the antibiotics. She will remain pain free for one to 2 days and then the cycle starts over. Her tonsils are 3+. They're erythematous and swollen. At times has a difficult time even swallowing. She is interesting in seeing an ENT for possible tonsillectomy. Past Medical History  Diagnosis Date  . Asthma   . Tachycardia     baseline tachycardia   . Retinopathy due to secondary diabetes   . Diabetic gastroparesis   . 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 Diabetic neuropathy, type I diabetes mellitus   . Polyneuropathy in diabetes(357.2)    Current Outpatient Prescriptions on File Prior to Visit  Medication Sig Dispense Refill  . albuterol (PROVENTIL HFA;VENTOLIN HFA) 108 (90 BASE) MCG/ACT inhaler Inhale 2 puffs into the lungs every 6 (six) hours as needed. As needed for shortness of breath.  1 Inhaler  11  . budesonide (PULMICORT) 180 MCG/ACT inhaler Inhale 1 puff into the lungs 2 (two) times daily.      . insulin lispro (HUMALOG) 100 UNIT/ML injection by Continuous infusion (non-IV) route 3 (three) times daily before meals. Insulin pump : 31 base units 1 unit for every five grams she eats.      Marland Kitchen lisinopril (PRINIVIL,ZESTRIL) 10 MG tablet Take 10 mg by mouth every morning.      . metoprolol tartrate (LOPRESSOR) 25 MG tablet Take 1 prn heart racing for more than 20 minutes  6 tablet  0  . montelukast (SINGULAIR) 10 MG tablet Take 10 mg by mouth every morning.      . Prenatal Vit-Fe Fumarate-FA (PRENATAL MULTIVITAMIN) TABS Take 1 tablet by mouth daily.         No current facility-administered medications on file prior to visit.    Allergies  Allergen Reactions  . Ceftin Rash      Review of Systems  All other systems reviewed and are negative.       Objective:   Physical Exam  Constitutional: She appears well-developed and well-nourished.  HENT:  Mouth/Throat: Uvula is midline. Posterior oropharyngeal edema present. No oropharyngeal exudate or tonsillar abscesses.  Neck: No thyromegaly present.  Cardiovascular: Normal rate, regular rhythm and normal heart sounds.   No murmur heard. Pulmonary/Chest: Effort normal and breath sounds normal. No respiratory distress. She has no wheezes. She has no rales.  Lymphadenopathy:    She has no cervical adenopathy.  Tonsils are 3+ and almost touching at the midline..        Assessment & Plan:  1. Chronic tonsillitis Continue Augmentin 875 mg by mouth twice a day quite sufficient for 10 additional days. Her tonsillitis right now is kept in check. However I do think it is reasonable for her to consult ENT regarding possible tonsillectomy due to recurrent tonsillitis. - Ambulatory referral to ENT

## 2013-04-15 ENCOUNTER — Other Ambulatory Visit (INDEPENDENT_AMBULATORY_CARE_PROVIDER_SITE_OTHER): Payer: Medicaid Other | Admitting: *Deleted

## 2013-04-15 VITALS — BP 136/87 | HR 108 | Temp 97.6°F | Wt 194.0 lb

## 2013-04-15 DIAGNOSIS — R8290 Unspecified abnormal findings in urine: Secondary | ICD-10-CM

## 2013-04-15 DIAGNOSIS — R82998 Other abnormal findings in urine: Secondary | ICD-10-CM

## 2013-04-15 DIAGNOSIS — IMO0002 Reserved for concepts with insufficient information to code with codable children: Secondary | ICD-10-CM

## 2013-04-15 DIAGNOSIS — Z3202 Encounter for pregnancy test, result negative: Secondary | ICD-10-CM

## 2013-04-15 DIAGNOSIS — R319 Hematuria, unspecified: Secondary | ICD-10-CM

## 2013-04-15 LAB — POCT URINE PREGNANCY: Preg Test, Ur: NEGATIVE

## 2013-04-15 LAB — POCT URINALYSIS DIPSTICK
Ketones, UA: NEGATIVE
Urobilinogen, UA: NEGATIVE
pH, UA: 5

## 2013-04-15 NOTE — Patient Instructions (Addendum)
Please return to office as schedule and as needed.

## 2013-04-15 NOTE — Progress Notes (Signed)
Pt here requesting UPT due to nausea, vomiting, headaches, tired, and strong urine odor. Pt reports these are the same symptoms with here previous pregnancy. UPT done in office today negative. Urine collected for c&s. Pt stated she started Macrobid (28 year old prescription) last PM. I advised pt to d/c and to await culture results. Pt expressed understanding. Pt denies frequency, urgency, abdominal cramping or dysuria. Pt advised when cycle is missed for a month to call for an office visit with provider. Pt okay with same.

## 2013-05-07 ENCOUNTER — Other Ambulatory Visit: Payer: Self-pay | Admitting: *Deleted

## 2013-05-07 MED ORDER — SULFAMETHOXAZOLE-TMP DS 800-160 MG PO TABS: 1.0000 | ORAL_TABLET | Freq: Two times a day (BID) | ORAL | Status: DC

## 2013-08-21 ENCOUNTER — Emergency Department (HOSPITAL_COMMUNITY)
Admission: EM | Admit: 2013-08-21 | Discharge: 2013-08-21 | Disposition: A | Discharge status: Home / Self Care | Payer: Medicaid Other | Attending: Emergency Medicine | Admitting: Emergency Medicine

## 2013-08-21 ENCOUNTER — Encounter (HOSPITAL_COMMUNITY): Payer: Self-pay | Admitting: Emergency Medicine

## 2013-08-21 DIAGNOSIS — Z881 Allergy status to other antibiotic agents status: Secondary | ICD-10-CM | POA: Insufficient documentation

## 2013-08-21 DIAGNOSIS — F411 Generalized anxiety disorder: Secondary | ICD-10-CM | POA: Insufficient documentation

## 2013-08-21 DIAGNOSIS — E1142 Type 2 diabetes mellitus with diabetic polyneuropathy: Secondary | ICD-10-CM | POA: Insufficient documentation

## 2013-08-21 DIAGNOSIS — E860 Dehydration: Secondary | ICD-10-CM | POA: Insufficient documentation

## 2013-08-21 DIAGNOSIS — R0602 Shortness of breath: Secondary | ICD-10-CM | POA: Insufficient documentation

## 2013-08-21 DIAGNOSIS — Z79899 Other long term (current) drug therapy: Secondary | ICD-10-CM | POA: Insufficient documentation

## 2013-08-21 DIAGNOSIS — R51 Headache: Secondary | ICD-10-CM | POA: Insufficient documentation

## 2013-08-21 DIAGNOSIS — R739 Hyperglycemia, unspecified: Secondary | ICD-10-CM

## 2013-08-21 DIAGNOSIS — Z3202 Encounter for pregnancy test, result negative: Secondary | ICD-10-CM | POA: Insufficient documentation

## 2013-08-21 DIAGNOSIS — R002 Palpitations: Secondary | ICD-10-CM | POA: Insufficient documentation

## 2013-08-21 DIAGNOSIS — E11319 Type 2 diabetes mellitus with unspecified diabetic retinopathy without macular edema: Secondary | ICD-10-CM | POA: Insufficient documentation

## 2013-08-21 DIAGNOSIS — Z8719 Personal history of other diseases of the digestive system: Secondary | ICD-10-CM | POA: Insufficient documentation

## 2013-08-21 DIAGNOSIS — I951 Orthostatic hypotension: Secondary | ICD-10-CM | POA: Insufficient documentation

## 2013-08-21 DIAGNOSIS — R079 Chest pain, unspecified: Secondary | ICD-10-CM | POA: Insufficient documentation

## 2013-08-21 DIAGNOSIS — E109 Type 1 diabetes mellitus without complications: Secondary | ICD-10-CM | POA: Insufficient documentation

## 2013-08-21 DIAGNOSIS — R42 Dizziness and giddiness: Secondary | ICD-10-CM | POA: Insufficient documentation

## 2013-08-21 DIAGNOSIS — J45909 Unspecified asthma, uncomplicated: Secondary | ICD-10-CM | POA: Insufficient documentation

## 2013-08-21 LAB — URINALYSIS, ROUTINE W REFLEX MICROSCOPIC
Glucose, UA: >1000 mg/dL — AB
Ketones, ur: 15 mg/dL — AB
Leukocytes, UA: NEGATIVE
pH: 8 (ref 5.0–8.0)

## 2013-08-21 LAB — POCT PREGNANCY, URINE: Preg Test, Ur: NEGATIVE

## 2013-08-21 LAB — URINE MICROSCOPIC-ADD ON

## 2013-08-21 LAB — POCT I-STAT, CHEM 8
BUN: 14 mg/dL (ref 6–23)
Chloride: 97 mEq/L (ref 96–112)
Creatinine, Ser: 1.3 mg/dL — ABNORMAL HIGH (ref 0.50–1.10)
Sodium: 135 mEq/L (ref 135–145)

## 2013-08-21 MED ORDER — ACETAMINOPHEN 325 MG PO TABS
650.0000 mg | ORAL_TABLET | Freq: Once | ORAL | Status: AC
  Administered 2013-08-21: 650 mg via ORAL
  Filled 2013-08-21: qty 2

## 2013-08-21 MED ORDER — SODIUM CHLORIDE 0.9 % IV BOLUS (SEPSIS)
1000.0000 mL | Freq: Once | INTRAVENOUS | Status: AC
  Administered 2013-08-21: 1000 mL via INTRAVENOUS

## 2013-08-21 MED ORDER — SODIUM CHLORIDE 0.9 % IV BOLUS (SEPSIS)
500.0000 mL | Freq: Once | INTRAVENOUS | Status: AC
  Administered 2013-08-21: 500 mL via INTRAVENOUS

## 2013-08-21 NOTE — ED Notes (Signed)
Pt was able to ambulate independently up and down the hall w/o complaint of dizziness or feeling unsteady

## 2013-08-21 NOTE — ED Provider Notes (Addendum)
Medical screening examination/treatment/procedure(s) were performed by non-physician practitioner and as supervising physician I was immediately available for consultation/collaboration.  EKG Interpretation     Ventricular Rate:  109 PR Interval:  149 QRS Duration: 89 QT Interval:  337 QTC Calculation: 454 R Axis:   88 Text Interpretation:  Sinus tachycardia ST elev, probable normal early repol pattern No significant change since last tracing              Audree Camel, MD 08/21/13 1652  Audree Camel, MD 08/21/13 [phone]

## 2013-08-21 NOTE — ED Notes (Signed)
Pt woke up this morning and felt like heart was racing but it went away.  Pt went to work and started having 10/10 sharp chest pain and shortness of breathe.  Pt was tachy on EMS arrival and when sat up pt started hyperventilating and c/o chest pain.  Chest pain and leg pain present.

## 2013-08-21 NOTE — ED Provider Notes (Signed)
CSN: [id number]     Arrival date & time 08/21/13  1039 History   First MD Initiated Contact with Patient 08/21/13 1058     Chief Complaint  Patient presents with  . Tachycardia   (Consider location/radiation/quality/duration/timing/severity/associated sxs/prior Treatment) HPI Comments: Patient with history of diabetes, previous eye surgery, panic attack 2 years ago -- presents with complaint of acute onset racing heart, lightheadedness with sitting up, left-sided chest pain which began acutely when she was going to work this morning. She states that upon awaking her heart was racing but this resolved. She has had these symptoms before. EMS was called and patient was transported to the hospital. Patient has a Mirena IUD and is not on any estrogens. She denies recent surgeries or mobilizations. No leg swelling or history of blood clots. No tx PTA. Currently has HA. The onset of this condition was acute. The course is constant. Alleviating factors: none.    The history is provided by the patient.    Past Medical History  Diagnosis Date  . Asthma   . Tachycardia     baseline tachycardia   . Retinopathy due to secondary diabetes   . Diabetic gastroparesis   . 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 Diabetic neuropathy, type I diabetes mellitus   . Polyneuropathy in diabetes(357.2)    Past Surgical History  Procedure Laterality Date  . Cholecystectomy    . Cesarean section    . Refractive surgery     Family History  Problem Relation Age of Onset  . Hypertension     History  Substance Use Topics  . Smoking status: Never Smoker   . Smokeless tobacco: Not on file  . Alcohol Use: No   OB History   Grav Para Term Preterm Abortions TAB SAB Ect Mult Living                 Review of Systems  Constitutional: Negative for fever.  HENT: Negative for rhinorrhea and sore throat.   Eyes: Negative for redness.  Respiratory: Positive for shortness of  breath. Negative for cough.   Cardiovascular: Positive for chest pain and palpitations. Negative for leg swelling.  Gastrointestinal: Negative for nausea, vomiting, abdominal pain and diarrhea.  Genitourinary: Negative for dysuria.  Musculoskeletal: Negative for myalgias.  Skin: Negative for rash.  Neurological: Positive for dizziness and light-headedness. Negative for headaches.  Psychiatric/Behavioral: The patient is nervous/anxious.     Allergies  Ceftin  Home Medications   Current Outpatient Rx  Name  Route  Sig  Dispense  Refill  . albuterol (PROVENTIL HFA;VENTOLIN HFA) 108 (90 BASE) MCG/ACT inhaler   Inhalation   Inhale 2 puffs into the lungs every 6 (six) hours as needed. As needed for shortness of breath.   1 Inhaler   11   . budesonide (PULMICORT) 180 MCG/ACT inhaler   Inhalation   Inhale 1 puff into the lungs 2 (two) times daily.         . insulin lispro (HUMALOG) 100 UNIT/ML injection   Continuous infusion (non-IV)   by Continuous infusion (non-IV) route 3 (three) times daily before meals. Insulin pump : 31 base units 1 unit for every five grams she eats.         Marland Kitchen lisinopril (PRINIVIL,ZESTRIL) 10 MG tablet   Oral   Take 10 mg by mouth every morning.         . metoprolol tartrate (LOPRESSOR) 25 MG tablet  Take 1 prn heart racing for more than 20 minutes   6 tablet   0   . montelukast (SINGULAIR) 10 MG tablet   Oral   Take 10 mg by mouth every morning.         . Prenatal Vit-Fe Fumarate-FA (PRENATAL MULTIVITAMIN) TABS   Oral   Take 1 tablet by mouth daily.           Marland Kitchen sulfamethoxazole-trimethoprim (BACTRIM DS) 800-160 MG per tablet   Oral   Take 1 tablet by mouth 2 (two) times daily.   14 tablet   0    BP 118/96  Pulse 106  Temp(Src) 97.9 F (36.6 C) (Oral)  Resp 18  Ht 5\' 6"  (1.676 m)  Wt 186 lb (84.369 kg)  BMI 30.04 kg/m2  SpO2 100%  LMP 08/11/2013 Physical Exam  Nursing note and vitals reviewed. Constitutional: She  appears well-developed and well-nourished.  HENT:  Head: Normocephalic and atraumatic.  Eyes: Conjunctivae are normal. Right eye exhibits no discharge. Left eye exhibits no discharge.  Irregular pupils 2/2 previous eye surgeries.   Neck: Normal range of motion. Neck supple.  Cardiovascular: Normal rate, regular rhythm and normal heart sounds.   HR 95  Pulmonary/Chest: Effort normal and breath sounds normal.  Abdominal: Soft. There is no tenderness.  Neurological: She is alert.  Skin: Skin is warm and dry.  Psychiatric: She has a normal mood and affect.    ED Course  Procedures (including critical care time) Labs Review Labs Reviewed  URINALYSIS, ROUTINE W REFLEX MICROSCOPIC - Abnormal; Notable for the following:    Glucose, UA >1000 (*)    Hgb urine dipstick TRACE (*)    Ketones, ur 15 (*)    All other components within normal limits  URINE MICROSCOPIC-ADD ON - Abnormal; Notable for the following:    Squamous Epithelial / LPF FEW (*)    Bacteria, UA FEW (*)    All other components within normal limits  GLUCOSE, CAPILLARY - Abnormal; Notable for the following:    Glucose-Capillary 329 (*)    All other components within normal limits  POCT I-STAT, CHEM 8 - Abnormal; Notable for the following:    Creatinine, Ser 1.30 (*)    Glucose, Bld 481 (*)    Hemoglobin 16.0 (*)    HCT 47.0 (*)    All other components within normal limits  POCT PREGNANCY, URINE   Imaging Review No results found.  EKG Interpretation   None      11:15 AM Patient seen and examined. EKG reviewed. Work-up initiated. Medications ordered.   Vital signs reviewed and are as follows: Filed Vitals:   08/21/13 1053  BP: 118/96  Pulse: 106  Temp: 97.9 F (36.6 C)  Resp: 18    Date: 08/21/2013  Rate: 109  Rhythm: sinus tachycardia  QRS Axis: normal  Intervals: normal  ST/T Wave abnormalities: early repolarization  Conduction Disutrbances:none  Narrative Interpretation: no prolonged QTc, Brugada,  WPW  Old EKG Reviewed: none available  2:20 PM Fluids given with resolution of tachycardia and orthostasis. Pt ambulated in department without difficulty. She is feeling better. Chest pain resolved. Blood sugar improving on insulin pump.   Patient to double fluid intake and rest.   Patient urged to return with worsening symptoms, worsening pain, SOB, syncope or other concerns. Patient verbalized understanding and agrees with plan.     MDM   1. Hyperglycemia   2. Dehydration   3. Orthostatic hypotension    Hyperglycemia: H/o  DM type I, no evidence of DKA here. Improving after pt changed insulin cartridge.   Dehydration/lightheadedness: possibly 2/2 osmotic dieuresis. EKG unconcerning. UPT neg. Hemoglobin concentrated, mild renal insufficiency.     Renne Crigler, PA-C 08/21/13 1426

## 2013-08-21 NOTE — ED Notes (Signed)
PA at bedside for evaluation

## 2013-08-22 ENCOUNTER — Encounter (HOSPITAL_COMMUNITY): Payer: Self-pay | Admitting: Emergency Medicine

## 2013-08-22 ENCOUNTER — Emergency Department (INDEPENDENT_AMBULATORY_CARE_PROVIDER_SITE_OTHER)
Admission: EM | Admit: 2013-08-22 | Discharge: 2013-08-22 | Disposition: A | Discharge status: Home / Self Care | Payer: Medicaid Other | Source: Home / Self Care | Attending: Family Medicine | Admitting: Family Medicine

## 2013-08-22 DIAGNOSIS — F411 Generalized anxiety disorder: Secondary | ICD-10-CM

## 2013-08-22 DIAGNOSIS — R000 Tachycardia, unspecified: Secondary | ICD-10-CM

## 2013-08-22 DIAGNOSIS — R079 Chest pain, unspecified: Secondary | ICD-10-CM

## 2013-08-22 DIAGNOSIS — F419 Anxiety disorder, unspecified: Secondary | ICD-10-CM

## 2013-08-22 MED ORDER — SERTRALINE HCL 25 MG PO TABS: 25.0000 mg | ORAL_TABLET | Freq: Every day | ORAL | Status: DC

## 2013-08-22 NOTE — ED Provider Notes (Signed)
CSN: [id number]     Arrival date & time 08/22/13  8657 History   First MD Initiated Contact with Patient 08/22/13 1043     Chief Complaint  Patient presents with  . Headache  . Chest Pain   (Consider location/radiation/quality/duration/timing/severity/associated sxs/prior Treatment) HPI Comments: 27 year old female presents complaining of left-sided chest pain with mild subjective shortness of breath, mild dizziness, and headache. She had these exact symptoms in the emergency department yesterday. She had a large workup, but after which she was determined to have anxiety. She has a history of anxiety and also a history of sinus tachycardia. She has left-sided chest pain that is sharp, nonradiating. The pain is exacerbated by "stress and thinking about stuff" and is not relieved by anything. She additionally admits to some left leg pain without swelling. She denies fever, chills, cough, NVD. No history of DVT or PE. She does not use any hormonal medications. She has an IUD for contraception. No significant change in her symptoms since yesterday. She was told to followup with her primary care physician but they cannot see her for 2 more weeks.  Patient is a 27 y.o. female presenting with headaches and chest pain.  Headache Associated symptoms: no abdominal pain, no cough, no dizziness, no fever, no myalgias, no nausea and no vomiting   Chest Pain Associated symptoms: headache and shortness of breath   Associated symptoms: no abdominal pain, no cough, no dizziness, no fever, no nausea, no palpitations, not vomiting and no weakness     Past Medical History  Diagnosis Date  . Asthma   . Tachycardia     baseline tachycardia   . Retinopathy due to secondary diabetes   . Diabetic gastroparesis   . 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 Diabetic neuropathy, type I diabetes mellitus   . Polyneuropathy in diabetes(357.2)    Past Surgical History  Procedure  Laterality Date  . Cholecystectomy    . Cesarean section    . Refractive surgery     Family History  Problem Relation Age of Onset  . Hypertension     History  Substance Use Topics  . Smoking status: Never Smoker   . Smokeless tobacco: Not on file  . Alcohol Use: No   OB History   Grav Para Term Preterm Abortions TAB SAB Ect Mult Living                 Review of Systems  Constitutional: Negative for fever and chills.  Eyes: Negative for visual disturbance.  Respiratory: Positive for shortness of breath. Negative for cough.   Cardiovascular: Positive for chest pain. Negative for palpitations and leg swelling.  Gastrointestinal: Negative for nausea, vomiting and abdominal pain.  Endocrine: Negative for polydipsia and polyuria.  Genitourinary: Negative for dysuria, urgency and frequency.  Musculoskeletal: Negative for arthralgias and myalgias.  Skin: Negative for rash.  Neurological: Positive for headaches. Negative for dizziness, weakness and light-headedness.  Psychiatric/Behavioral: The patient is nervous/anxious.     Allergies  Ceftin  Home Medications   Current Outpatient Rx  Name  Route  Sig  Dispense  Refill  . brimonidine (ALPHAGAN) 0.15 % ophthalmic solution   Right Eye   Place 1 drop into the right eye 3 (three) times daily.         . insulin lispro (HUMALOG) 100 UNIT/ML injection   Continuous infusion (non-IV)   by Continuous infusion (non-IV) route 3 (three) times daily before meals. Insulin pump :  31 base units 1 unit for every five grams she eats.         Marland Kitchen lisinopril (PRINIVIL,ZESTRIL) 10 MG tablet   Oral   Take 10 mg by mouth every morning.         . montelukast (SINGULAIR) 10 MG tablet   Oral   Take 10 mg by mouth every morning.         . prednisoLONE acetate (PRED FORTE) 1 % ophthalmic suspension   Right Eye   Place 1 drop into the right eye 3 (three) times daily.         . Prenatal Vit-Fe Fumarate-FA (PRENATAL MULTIVITAMIN) TABS    Oral   Take 1 tablet by mouth daily.           Marland Kitchen albuterol (PROVENTIL HFA;VENTOLIN HFA) 108 (90 BASE) MCG/ACT inhaler   Inhalation   Inhale 2 puffs into the lungs every 6 (six) hours as needed. As needed for shortness of breath.   1 Inhaler   11   . budesonide (PULMICORT) 180 MCG/ACT inhaler   Inhalation   Inhale 1 puff into the lungs 2 (two) times daily.         . sertraline (ZOLOFT) 25 MG tablet   Oral   Take 1 tablet (25 mg total) by mouth daily.   30 tablet   0    BP 141/97  Pulse 107  Temp(Src) 98 F (36.7 C) (Oral)  Resp 16  SpO2 100%  LMP 08/11/2013 Physical Exam  Nursing note and vitals reviewed. Constitutional: She is oriented to person, place, and time. Vital signs are normal. She appears well-developed and well-nourished. No distress.  HENT:  Head: Normocephalic and atraumatic.  Cardiovascular: Normal heart sounds.  Tachycardia present.   No peripheral edema, redness, swelling  Pulmonary/Chest: Breath sounds normal. No respiratory distress. She has no wheezes. She has no rales.  Neurological: She is alert and oriented to person, place, and time. She has normal strength. Coordination normal.  Skin: Skin is warm and dry. No rash noted. She is not diaphoretic.  Psychiatric: She has a normal mood and affect. Judgment normal.    ED Course  Procedures (including critical care time) Labs Review Labs Reviewed  D-DIMER, QUANTITATIVE   Imaging Review No results found.  EKG repeated. D-dimer sent as well. EKG is normal, unchanged from previous.  MDM   1. Anxiety   2. Chest pain   3. Tachycardia    Low probability PE with a negative d-dimer. Will initiate treatment for anxiety with an SSRI. Followup with primary care physician in 2-3 weeks.   Meds ordered this encounter  Medications  . sertraline (ZOLOFT) 25 MG tablet    Sig: Take 1 tablet (25 mg total) by mouth daily.    Dispense:  30 tablet    Refill:  0    Order Specific Question:  Supervising  Provider    Answer:  Lorenz Coaster, DAVID C [[id number]]     Graylon Good, PA-C 08/22/13 1201

## 2013-08-22 NOTE — ED Notes (Signed)
C/o having an episode yesterday of left sided chest pain. Headache. Sob. And heart racing with increase in blood pressure.  Pt was seen in ER and was dx as being dehydrated and having possible panic attack.  States "I have been under a lot of stress"  Denies hx of anxiety.   Pt states started having the same symptoms again this a.m and wanted to be rechecked.

## 2013-08-24 NOTE — ED Provider Notes (Signed)
Medical screening examination/treatment/procedure(s) were performed by resident physician or non-physician practitioner and as supervising physician I was immediately available for consultation/collaboration.   Kiandra Sanguinetti DOUGLAS MD.   Arlester Keehan D Asja Frommer, MD 08/24/13 1119 

## 2013-09-05 ENCOUNTER — Encounter: Payer: Self-pay | Admitting: Family Medicine

## 2013-09-05 ENCOUNTER — Ambulatory Visit (INDEPENDENT_AMBULATORY_CARE_PROVIDER_SITE_OTHER): Payer: Medicaid Other | Admitting: Family Medicine

## 2013-09-05 VITALS — BP 130/88 | HR 98 | Temp 97.4°F | Resp 16 | Wt 190.0 lb

## 2013-09-05 DIAGNOSIS — F411 Generalized anxiety disorder: Secondary | ICD-10-CM

## 2013-09-05 MED ORDER — ALPRAZOLAM 0.5 MG PO TABS: 0.5000 mg | ORAL_TABLET | Freq: Three times a day (TID) | ORAL | Status: DC | PRN

## 2013-09-05 MED ORDER — SERTRALINE HCL 50 MG PO TABS: ORAL_TABLET | ORAL | Status: DC

## 2013-09-05 MED ORDER — METOPROLOL TARTRATE 25 MG PO TABS: 25.0000 mg | ORAL_TABLET | ORAL | Status: DC | PRN

## 2013-09-05 NOTE — Progress Notes (Signed)
Subjective:    Patient ID: [name], female    DOB: 11-15-1985, 27 y.o.   MRN: [id number]  HPI  Patient is a very pleasant 27 year old white female who is in the emergency room as well as in urgent care for tachycardia, chest pain, shortness of breath, and anxiety. She is under a tremendous amount of stress dealing with a custody battle with her ex-husband as well as relationship problems with her fianc.  She feels that this is triggering her panic attacks. She started on Zoloft 25 mg by mouth each bedtime 2 weeks ago but has not noticed any improvement with this. I reviewed emergency room records as well as an EKG which showed normal sinus rhythm. She is almost having daily panic attacks. She denies any suicidal ideation, hallucinations, delusions, or mania. Past Medical History  Diagnosis Date  . Asthma   . Tachycardia     baseline tachycardia   . Retinopathy due to secondary diabetes   . Diabetic gastroparesis   . 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 Diabetic neuropathy, type I diabetes mellitus   . Polyneuropathy in diabetes(357.2)   . Anxiety    Current Outpatient Prescriptions on File Prior to Visit  Medication Sig Dispense Refill  . albuterol (PROVENTIL HFA;VENTOLIN HFA) 108 (90 BASE) MCG/ACT inhaler Inhale 2 puffs into the lungs every 6 (six) hours as needed. As needed for shortness of breath.  1 Inhaler  11  . brimonidine (ALPHAGAN) 0.15 % ophthalmic solution Place 1 drop into the right eye 3 (three) times daily.      . budesonide (PULMICORT) 180 MCG/ACT inhaler Inhale 1 puff into the lungs 2 (two) times daily.      . insulin lispro (HUMALOG) 100 UNIT/ML injection by Continuous infusion (non-IV) route 3 (three) times daily before meals. Insulin pump : 31 base units 1 unit for every five grams she eats.      Marland Kitchen lisinopril (PRINIVIL,ZESTRIL) 10 MG tablet Take 10 mg by mouth every morning.      . montelukast (SINGULAIR) 10 MG tablet Take  10 mg by mouth every morning.      . prednisoLONE acetate (PRED FORTE) 1 % ophthalmic suspension Place 1 drop into the right eye 3 (three) times daily.      . Prenatal Vit-Fe Fumarate-FA (PRENATAL MULTIVITAMIN) TABS Take 1 tablet by mouth daily.        . sertraline (ZOLOFT) 25 MG tablet Take 1 tablet (25 mg total) by mouth daily.  30 tablet  0   No current facility-administered medications on file prior to visit.   Allergies  Allergen Reactions  . Ceftin Rash   History   Social History  . Marital Status: Divorced    Spouse Name: N/A    Number of Children: N/A  . Years of Education: N/A   Occupational History  . Not on file.   Social History Main Topics  . Smoking status: Never Smoker   . Smokeless tobacco: Not on file  . Alcohol Use: No  . Drug Use: No  . Sexual Activity: Yes   Other Topics Concern  . Not on file   Social History Narrative   Pt has a daughter and boyfriend.            Review of Systems  All other systems reviewed and are negative.       Objective:   Physical Exam  Vitals reviewed. Cardiovascular: Normal rate, regular rhythm and normal  heart sounds.   No murmur heard. Pulmonary/Chest: Effort normal and breath sounds normal. No respiratory distress. She has no wheezes. She has no rales.  Psychiatric: She has a normal mood and affect. Her behavior is normal. Judgment and thought content normal.          Assessment & Plan:  1. GAD (generalized anxiety disorder) Start Zoloft 50 mg by mouth each bedtime for one week then increase to 100 mg by mouth each bedtime. Use Xanax 0.5 mg every 8 hours as needed for anxiety. Recheck in 4 weeks or sooner if worse. Also refill the patient's metoprolol. - sertraline (ZOLOFT) 50 MG tablet; Take 1 pill at night for the first week then increase to 2 pills at night.  Dispense: 60 tablet; Refill: 3 - ALPRAZolam (XANAX) 0.5 MG tablet; Take 1 tablet (0.5 mg total) by mouth 3 (three) times daily as needed for  anxiety.  Dispense: 30 tablet; Refill: 0

## 2013-09-26 ENCOUNTER — Inpatient Hospital Stay (HOSPITAL_COMMUNITY)
Admission: AD | Admit: 2013-09-26 | Discharge: 2013-09-29 | DRG: 885 | Disposition: A | Discharge status: Home / Self Care | Payer: Medicaid Other | Attending: Psychiatry | Admitting: Psychiatry

## 2013-09-26 ENCOUNTER — Emergency Department (HOSPITAL_COMMUNITY)
Admission: EM | Admit: 2013-09-26 | Discharge: 2013-09-26 | Disposition: A | Discharge status: Other Acute Inpatient Hospital | Payer: Medicaid Other | Source: Home / Self Care | Attending: Emergency Medicine | Admitting: Emergency Medicine

## 2013-09-26 ENCOUNTER — Encounter (HOSPITAL_COMMUNITY): Payer: Self-pay | Admitting: *Deleted

## 2013-09-26 ENCOUNTER — Encounter (HOSPITAL_COMMUNITY): Payer: Self-pay | Admitting: Emergency Medicine

## 2013-09-26 DIAGNOSIS — K561 Intussusception: Secondary | ICD-10-CM

## 2013-09-26 DIAGNOSIS — R739 Hyperglycemia, unspecified: Secondary | ICD-10-CM

## 2013-09-26 DIAGNOSIS — Z862 Personal history of diseases of the blood and blood-forming organs and certain disorders involving the immune mechanism: Secondary | ICD-10-CM

## 2013-09-26 DIAGNOSIS — E1010 Type 1 diabetes mellitus with ketoacidosis without coma: Secondary | ICD-10-CM

## 2013-09-26 DIAGNOSIS — J90 Pleural effusion, not elsewhere classified: Secondary | ICD-10-CM

## 2013-09-26 DIAGNOSIS — Z87448 Personal history of other diseases of urinary system: Secondary | ICD-10-CM

## 2013-09-26 DIAGNOSIS — F329 Major depressive disorder, single episode, unspecified: Secondary | ICD-10-CM | POA: Diagnosis present

## 2013-09-26 DIAGNOSIS — F332 Major depressive disorder, recurrent severe without psychotic features: Secondary | ICD-10-CM | POA: Diagnosis present

## 2013-09-26 DIAGNOSIS — K8020 Calculus of gallbladder without cholecystitis without obstruction: Secondary | ICD-10-CM

## 2013-09-26 DIAGNOSIS — R45851 Suicidal ideations: Secondary | ICD-10-CM

## 2013-09-26 DIAGNOSIS — K3184 Gastroparesis: Secondary | ICD-10-CM

## 2013-09-26 DIAGNOSIS — R1012 Left upper quadrant pain: Secondary | ICD-10-CM

## 2013-09-26 DIAGNOSIS — Z8719 Personal history of other diseases of the digestive system: Secondary | ICD-10-CM

## 2013-09-26 DIAGNOSIS — F32A Depression, unspecified: Secondary | ICD-10-CM | POA: Diagnosis present

## 2013-09-26 DIAGNOSIS — E1049 Type 1 diabetes mellitus with other diabetic neurological complication: Secondary | ICD-10-CM | POA: Diagnosis present

## 2013-09-26 DIAGNOSIS — I498 Other specified cardiac arrhythmias: Secondary | ICD-10-CM

## 2013-09-26 DIAGNOSIS — E10319 Type 1 diabetes mellitus with unspecified diabetic retinopathy without macular edema: Secondary | ICD-10-CM

## 2013-09-26 DIAGNOSIS — Z79899 Other long term (current) drug therapy: Secondary | ICD-10-CM

## 2013-09-26 DIAGNOSIS — Z9289 Personal history of other medical treatment: Secondary | ICD-10-CM

## 2013-09-26 DIAGNOSIS — R3919 Other difficulties with micturition: Secondary | ICD-10-CM

## 2013-09-26 DIAGNOSIS — E1142 Type 2 diabetes mellitus with diabetic polyneuropathy: Secondary | ICD-10-CM

## 2013-09-26 DIAGNOSIS — F411 Generalized anxiety disorder: Secondary | ICD-10-CM

## 2013-09-26 DIAGNOSIS — R110 Nausea: Secondary | ICD-10-CM

## 2013-09-26 DIAGNOSIS — Z8639 Personal history of other endocrine, nutritional and metabolic disease: Secondary | ICD-10-CM

## 2013-09-26 DIAGNOSIS — F4310 Post-traumatic stress disorder, unspecified: Secondary | ICD-10-CM

## 2013-09-26 DIAGNOSIS — J45909 Unspecified asthma, uncomplicated: Secondary | ICD-10-CM | POA: Diagnosis present

## 2013-09-26 DIAGNOSIS — I10 Essential (primary) hypertension: Secondary | ICD-10-CM | POA: Diagnosis present

## 2013-09-26 HISTORY — DX: Essential (primary) hypertension: I10

## 2013-09-26 LAB — CBC WITH DIFFERENTIAL/PLATELET
Eosinophils Absolute: 0.2 10*3/uL (ref 0.0–0.7)
Hemoglobin: 13.6 g/dL (ref 12.0–15.0)
Lymphs Abs: 2.1 10*3/uL (ref 0.7–4.0)
MCH: 28.4 pg (ref 26.0–34.0)
Monocytes Absolute: 0.5 10*3/uL (ref 0.1–1.0)
Monocytes Relative: 7 % (ref 3–12)
Neutro Abs: 4 10*3/uL (ref 1.7–7.7)
Neutrophils Relative %: 59 % (ref 43–77)
Platelets: 295 10*3/uL (ref 150–400)
RBC: 4.79 MIL/uL (ref 3.87–5.11)
WBC: 6.8 10*3/uL (ref 4.0–10.5)

## 2013-09-26 LAB — COMPREHENSIVE METABOLIC PANEL
ALT: 12 U/L (ref 0–35)
Albumin: 3.8 g/dL (ref 3.5–5.2)
Alkaline Phosphatase: 121 U/L — ABNORMAL HIGH (ref 39–117)
BUN: 15 mg/dL (ref 6–23)
Calcium: 9.2 mg/dL (ref 8.4–10.5)
Chloride: 94 mEq/L — ABNORMAL LOW (ref 96–112)
Creatinine, Ser: 0.91 mg/dL (ref 0.50–1.10)
GFR calc Af Amer: >90 mL/min (ref >90)
GFR calc non Af Amer: 85 mL/min — ABNORMAL LOW (ref >90)
Glucose, Bld: 485 mg/dL — ABNORMAL HIGH (ref 70–99)
Potassium: 4.4 mEq/L (ref 3.5–5.1)
Sodium: 129 mEq/L — ABNORMAL LOW (ref 135–145)
Total Bilirubin: 0.6 mg/dL (ref 0.3–1.2)
Total Protein: 7.3 g/dL (ref 6.0–8.3)

## 2013-09-26 LAB — RAPID URINE DRUG SCREEN, HOSP PERFORMED
Amphetamines: NOT DETECTED
Benzodiazepines: NOT DETECTED
Cocaine: NOT DETECTED
Opiates: NOT DETECTED
Tetrahydrocannabinol: NOT DETECTED

## 2013-09-26 LAB — GLUCOSE, CAPILLARY
Glucose-Capillary: 353 mg/dL — ABNORMAL HIGH (ref 70–99)
Glucose-Capillary: 306 mg/dL — ABNORMAL HIGH (ref 70–99)
Glucose-Capillary: 230 mg/dL — ABNORMAL HIGH (ref 70–99)

## 2013-09-26 LAB — ETHANOL: Alcohol, Ethyl (B): <11 mg/dL (ref 0–11)

## 2013-09-26 MED ORDER — INSULIN ASPART 100 UNIT/ML ~~LOC~~ SOLN
8.0000 [IU] | Freq: Three times a day (TID) | SUBCUTANEOUS | Status: DC
  Administered 2013-09-26: 8 [IU] via SUBCUTANEOUS

## 2013-09-26 MED ORDER — INSULIN GLARGINE 100 UNIT/ML ~~LOC~~ SOLN
30.0000 [IU] | SUBCUTANEOUS | Status: DC
  Administered 2013-09-26: 30 [IU] via SUBCUTANEOUS

## 2013-09-26 MED ORDER — INSULIN GLARGINE 100 UNIT/ML ~~LOC~~ SOLN
30.0000 [IU] | Freq: Every day | SUBCUTANEOUS | Status: DC
  Administered 2013-09-27 – 2013-09-28 (×2): 30 [IU] via SUBCUTANEOUS

## 2013-09-26 MED ORDER — ALBUTEROL SULFATE HFA 108 (90 BASE) MCG/ACT IN AERS: 2.0000 | INHALATION_SPRAY | Freq: Four times a day (QID) | RESPIRATORY_TRACT | Status: DC | PRN

## 2013-09-26 MED ORDER — MAGNESIUM HYDROXIDE 400 MG/5ML PO SUSP: 30.0000 mL | Freq: Every day | ORAL | Status: DC | PRN

## 2013-09-26 MED ORDER — FLUTICASONE PROPIONATE HFA 44 MCG/ACT IN AERO
2.0000 | INHALATION_SPRAY | Freq: Two times a day (BID) | RESPIRATORY_TRACT | Status: DC
  Filled 2013-09-26: qty 10.6

## 2013-09-26 MED ORDER — INSULIN ASPART 100 UNIT/ML ~~LOC~~ SOLN: 8.0000 [IU] | Freq: Three times a day (TID) | SUBCUTANEOUS | Status: DC

## 2013-09-26 MED ORDER — ACETAMINOPHEN 325 MG PO TABS: 650.0000 mg | ORAL_TABLET | Freq: Four times a day (QID) | ORAL | Status: DC | PRN

## 2013-09-26 MED ORDER — ALUM & MAG HYDROXIDE-SIMETH 200-200-20 MG/5ML PO SUSP: 30.0000 mL | ORAL | Status: DC | PRN

## 2013-09-26 MED ORDER — ACETAMINOPHEN 325 MG PO TABS
650.0000 mg | ORAL_TABLET | Freq: Four times a day (QID) | ORAL | Status: DC | PRN
  Administered 2013-09-28 – 2013-09-29 (×2): 650 mg via ORAL
  Filled 2013-09-26 (×2): qty 2

## 2013-09-26 MED ORDER — ALBUTEROL SULFATE HFA 108 (90 BASE) MCG/ACT IN AERS
2.0000 | INHALATION_SPRAY | RESPIRATORY_TRACT | Status: DC | PRN
  Administered 2013-09-27: 2 via RESPIRATORY_TRACT

## 2013-09-26 MED ORDER — INSULIN ASPART 100 UNIT/ML ~~LOC~~ SOLN
0.0000 [IU] | Freq: Every day | SUBCUTANEOUS | Status: DC
  Administered 2013-09-26: 3 [IU] via SUBCUTANEOUS

## 2013-09-26 MED ORDER — PREDNISOLONE ACETATE 1 % OP SUSP
1.0000 [drp] | Freq: Three times a day (TID) | OPHTHALMIC | Status: DC
  Filled 2013-09-26: qty 1

## 2013-09-26 MED ORDER — INSULIN ASPART 100 UNIT/ML ~~LOC~~ SOLN: 4.0000 [IU] | Freq: Three times a day (TID) | SUBCUTANEOUS | Status: DC

## 2013-09-26 MED ORDER — LISINOPRIL 10 MG PO TABS: 10.0000 mg | ORAL_TABLET | Freq: Every morning | ORAL | Status: DC

## 2013-09-26 MED ORDER — INSULIN ASPART 100 UNIT/ML ~~LOC~~ SOLN
0.0000 [IU] | Freq: Three times a day (TID) | SUBCUTANEOUS | Status: DC
  Administered 2013-09-27: 5 [IU] via SUBCUTANEOUS

## 2013-09-26 MED ORDER — INSULIN ASPART 100 UNIT/ML ~~LOC~~ SOLN: 0.0000 [IU] | Freq: Three times a day (TID) | SUBCUTANEOUS | Status: DC

## 2013-09-26 MED ORDER — ALPRAZOLAM 0.5 MG PO TABS
0.5000 mg | ORAL_TABLET | Freq: Two times a day (BID) | ORAL | Status: DC | PRN
  Administered 2013-09-27 – 2013-09-28 (×2): 0.5 mg via ORAL
  Filled 2013-09-26 (×3): qty 1

## 2013-09-26 MED ORDER — SERTRALINE HCL 100 MG PO TABS
100.0000 mg | ORAL_TABLET | Freq: Every day | ORAL | Status: DC
  Administered 2013-09-27 – 2013-09-28 (×2): 100 mg via ORAL
  Filled 2013-09-26 (×4): qty 1

## 2013-09-26 MED ORDER — NICOTINE 21 MG/24HR TD PT24: 21.0000 mg | MEDICATED_PATCH | Freq: Every day | TRANSDERMAL | Status: DC

## 2013-09-26 MED ORDER — HYDROXYZINE HCL 25 MG PO TABS: 25.0000 mg | ORAL_TABLET | ORAL | Status: DC | PRN

## 2013-09-26 MED ORDER — INSULIN ASPART 100 UNIT/ML ~~LOC~~ SOLN
0.0000 [IU] | Freq: Three times a day (TID) | SUBCUTANEOUS | Status: DC
  Administered 2013-09-26: 5 [IU] via SUBCUTANEOUS

## 2013-09-26 MED ORDER — SERTRALINE HCL 50 MG PO TABS: 25.0000 mg | ORAL_TABLET | Freq: Every day | ORAL | Status: DC

## 2013-09-26 MED ORDER — BRIMONIDINE TARTRATE 0.15 % OP SOLN
1.0000 [drp] | Freq: Three times a day (TID) | OPHTHALMIC | Status: DC
  Filled 2013-09-26: qty 5

## 2013-09-26 MED ORDER — MONTELUKAST SODIUM 10 MG PO TABS
10.0000 mg | ORAL_TABLET | Freq: Every day | ORAL | Status: DC
  Administered 2013-09-26 – 2013-09-28 (×3): 10 mg via ORAL
  Filled 2013-09-26 (×6): qty 1

## 2013-09-26 MED ORDER — MONTELUKAST SODIUM 10 MG PO TABS: 10.0000 mg | ORAL_TABLET | Freq: Every morning | ORAL | Status: DC

## 2013-09-26 MED ORDER — INSULIN ASPART 100 UNIT/ML ~~LOC~~ SOLN
4.0000 [IU] | Freq: Three times a day (TID) | SUBCUTANEOUS | Status: DC
  Administered 2013-09-27: 4 [IU] via SUBCUTANEOUS

## 2013-09-26 MED ORDER — LISINOPRIL 10 MG PO TABS
10.0000 mg | ORAL_TABLET | Freq: Every day | ORAL | Status: DC
  Administered 2013-09-26 – 2013-09-29 (×4): 10 mg via ORAL
  Filled 2013-09-26 (×6): qty 1
  Filled 2013-09-26: qty 2

## 2013-09-26 NOTE — Progress Notes (Signed)
27 year old female pt admitted due to depression and suicidal thoughts. Pt reports that she told her office manager about how she was feeling and her office manager brought her to the ED. Pt does report she has been battling depression and anxiety and has been having suicidal thoughts but has not acted on them. Pt lives with her fiance and her 91 year old child. Pt is diabetic and had insulin pump removed prior to being admitted. Pt was oriented to the unit and safety maintained.
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
## 2013-09-26 NOTE — ED Provider Notes (Signed)
Medical screening examination/treatment/procedure(s) were performed by non-physician practitioner and as supervising physician I was immediately available for consultation/collaboration.  Izora Benn L Tee Richeson, MD 09/26/13 1632 

## 2013-09-26 NOTE — Progress Notes (Signed)
Per TTS patient accepted to [facility] 507-1, by Dr. Dub Mikes. CSW completed support paperwork. RN aware and will arrange [employer].   Catha Gosselin, LCSW [phone]  ED CSW .09/26/2013 110pm

## 2013-09-26 NOTE — ED Notes (Signed)
Patient is requesting medical clearance due to depression. Patient denies being SI/HI. Patient denies any auditory or visual hallucinations. Patient denies consuming any alcohol or drugs. Patient is tearful at times.

## 2013-09-26 NOTE — ED Notes (Signed)
Reported to [facility], RN at [facility] that the patient's BS 306 and informed that the patient was to remove her insulin pump when [employer] arrives.

## 2013-09-26 NOTE — Progress Notes (Signed)
Patient concerned about the need for a longer acting insulin (patient currently on moderate sliding scale). Patient states "I need a longer acting insulin and if I don't get it I will go into DKA." RN notified PA, Verne Spurr about patient's request at 1700. Current CBG at 1700 is 230 and 5 units of novolog were given. Will continue to monitor patient.

## 2013-09-26 NOTE — Tx Team (Signed)
Initial Interdisciplinary Treatment Plan  PATIENT STRENGTHS: (choose at least two) Ability for insight Average or above average intelligence Capable of independent living General fund of knowledge Supportive family/friends  PATIENT STRESSORS: Financial difficulties Medication change or noncompliance   PROBLEM LIST: Problem List/Patient Goals Date to be addressed Date deferred Reason deferred Estimated date of resolution  Depression 09/26/13     Suicidal Ideation 09/26/13                                                DISCHARGE CRITERIA:  Ability to meet basic life and health needs Improved stabilization in mood, thinking, and/or behavior Verbal commitment to aftercare and medication compliance  PRELIMINARY DISCHARGE PLAN: Attend aftercare/continuing care group Return to previous living arrangement  PATIENT/FAMIILY INVOLVEMENT: This treatment plan has been presented to and reviewed with the patient, [name], and/or family member, .  The patient and family have been given the opportunity to ask questions and make suggestions.  [name], [place] 09/26/2013, 4:03 PM

## 2013-09-26 NOTE — ED Provider Notes (Signed)
CSN: [id number]     Arrival date & time 09/26/13  1146 History   This chart was scribed for non-physician practitioner, Santiago Glad, PA, working with Flint Melter, MD by Ronal Fear, ED scribe. This patient was seen in room WTR3/WLPT3 and the patient's care was started at 12:40 PM.    Chief Complaint  Patient presents with  . Medical Clearance   (Consider location/radiation/quality/duration/timing/severity/associated sxs/prior Treatment) HPI  HPI Comments: [name] is a 27 y.o. female who presents to the Emergency Department complaining of depression.  Patient was sent to the ED from [facility] to obtain medical clearance.  She has already been accepted at [facility].  Patient reports that she  had suicidal ideation 1 week ago where she had a plan to overdose on her Insulin and her Xanax.   She denies suicidal thoughts at this moment, but reports that she has been increasingly depressed. She is currently on Zoloft prescribed by her PCP.  She states that the recent familial issues have been really stressing her out.  She denies seeing a Photographer.  She denies alcohol or drug use.  She denies any physical complaints at this time.   PCP: Leo Grosser, MD  Past Medical History  Diagnosis Date  . Asthma   . Tachycardia     baseline tachycardia   . Retinopathy due to secondary diabetes   . Diabetic gastroparesis   . 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 Diabetic neuropathy, type I diabetes mellitus   . Polyneuropathy in diabetes(357.2)   . Anxiety   . Hypertension    Past Surgical History  Procedure Laterality Date  . Cholecystectomy    . Cesarean section    . Refractive surgery     Family History  Problem Relation Age of Onset  . Hypertension     History  Substance Use Topics  . Smoking status: Never Smoker   . Smokeless tobacco: Never Used  . Alcohol Use: No   OB History   Grav Para Term Preterm Abortions TAB  SAB Ect Mult Living                 Review of Systems  Psychiatric/Behavioral: Positive for suicidal ideas. Negative for hallucinations and self-injury.  All other systems reviewed and are negative.    Allergies  Ceftin  Home Medications   Current Outpatient Rx  Name  Route  Sig  Dispense  Refill  . albuterol (PROVENTIL HFA;VENTOLIN HFA) 108 (90 BASE) MCG/ACT inhaler   Inhalation   Inhale 2 puffs into the lungs every 6 (six) hours as needed. As needed for shortness of breath.   1 Inhaler   11   . ALPRAZolam (XANAX) 0.5 MG tablet   Oral   Take 1 tablet (0.5 mg total) by mouth 3 (three) times daily as needed for anxiety.   30 tablet   0   . brimonidine (ALPHAGAN) 0.15 % ophthalmic solution   Right Eye   Place 1 drop into the right eye 3 (three) times daily.         . budesonide (PULMICORT) 180 MCG/ACT inhaler   Inhalation   Inhale 1 puff into the lungs 2 (two) times daily.         . insulin lispro (HUMALOG) 100 UNIT/ML injection   Continuous infusion (non-IV)   by Continuous infusion (non-IV) route 3 (three) times daily before meals. Insulin pump : 31 base units 1 unit for every five  grams she eats.         Marland Kitchen lisinopril (PRINIVIL,ZESTRIL) 10 MG tablet   Oral   Take 10 mg by mouth every morning.         . metoprolol tartrate (LOPRESSOR) 25 MG tablet   Oral   Take 1 tablet (25 mg total) by mouth as needed.   30 tablet   1   . montelukast (SINGULAIR) 10 MG tablet   Oral   Take 10 mg by mouth every morning.         . prednisoLONE acetate (PRED FORTE) 1 % ophthalmic suspension   Right Eye   Place 1 drop into the right eye 3 (three) times daily.         . sertraline (ZOLOFT) 25 MG tablet   Oral   Take 1 tablet (25 mg total) by mouth daily.   30 tablet   0    BP 138/90  Pulse 89  Temp(Src) 97.9 F (36.6 C) (Oral)  Resp 16  SpO2 100%  LMP 09/19/2013 Physical Exam  Nursing note and vitals reviewed. Constitutional: She is oriented to person,  place, and time. She appears well-developed and well-nourished. No distress.  HENT:  Head: Normocephalic and atraumatic.  Eyes: EOM are normal.  Neck: Neck supple. No tracheal deviation present.  Cardiovascular: Normal rate.   Pulmonary/Chest: Effort normal. No respiratory distress.  Musculoskeletal: Normal range of motion.  Neurological: She is alert and oriented to person, place, and time.  Skin: Skin is warm and dry.  Psychiatric: Her behavior is normal. She exhibits a depressed mood.    ED Course  Procedures (including critical care time) DIAGNOSTIC STUDIES: Oxygen Saturation is 100% on RA, normal by my interpretation.    COORDINATION OF CARE:  12:46 PM- Pt advised of plan for treatment including medical clearance and pt agrees.  Labs Review Labs Reviewed  CBC WITH DIFFERENTIAL  COMPREHENSIVE METABOLIC PANEL  URINE RAPID DRUG SCREEN (HOSP PERFORMED)  ETHANOL   Imaging Review No results found.  EKG Interpretation   None       MDM  No diagnosis found. Patient presents today with depression.  She was sent here from [facility] to obtain medical clearance.  Patient denies any active suicidal thoughts at this time, but reports that she has been having recent suicidal thoughts with plan to OD on Insulin and Xanax.  Labs unremarkable aside from initial blood sugar of 453.  Anion gap=9.  Normal bicarbonate.  Patient has insulin pump and gave herself dose of insulin, which brought her blood sugar down.  Patient accepted at [facility]. Patient sent back to [facility] for admission.  I personally performed the services described in this documentation, which was scribed in my presence. The recorded information has been reviewed and is accurate.    Santiago Glad, PA-C 09/26/13 1447  Santiago Glad, PA-C 09/26/13 [phone]
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
## 2013-09-26 NOTE — Progress Notes (Signed)
Inpatient Diabetes Program Recommendations  AACE/ADA: New Consensus Statement on Inpatient Glycemic Control (2013)  Target Ranges:  Prepandial:   less than 140 mg/dL      Peak postprandial:   less than 180 mg/dL (1-2 hours)      Critically ill patients:  140 - 180 mg/dL   Reason for Visit: Diabetes Consult for Glycemic Control  Pt transferred to [facility] from [facility] for depression and suicidal ideation. Initial blood sugar of 453. Anion gap=9. Normal bicarbonate. Patient has insulin pump and gave herself dose of insulin, which brought her blood sugar down. Pt concerned about removing insulin pump approx 2 hours ago and not having any insulin.  Given Novolog 5 units at 1658. Spoke with pt regarding insulin pump settings.   Basal: 1.25 units/hr Bolus:  1/5 insulin CHO ratio CF: 40 (1 unit for every 40mg /dL >295)   Needs basal/bolus insulin. Discussed recommendations with NP and pt.  Recommendations: Lantus 30 units Q24 hours. Novolog sensitive tidwc. Novolog 8 units tidwc for meal coverage insulin if pt eats >50% meal. Check HgbA1C to assess glycemic control prior to hospitalizaiton. Please reassess daily for titration of meal coverage insulin.  Titrate Lantus if FBS >180mg /dL.  Thank you. Ailene Ards, RD, LDN, CDE Inpatient Diabetes Coordinator [phone] (On-call pager)

## 2013-09-27 LAB — HEMOGLOBIN A1C: Mean Plasma Glucose: 255 mg/dL — ABNORMAL HIGH (ref <117)

## 2013-09-27 LAB — GLUCOSE, CAPILLARY: Glucose-Capillary: 267 mg/dL — ABNORMAL HIGH (ref 70–99)

## 2013-09-27 MED ORDER — INSULIN ASPART 100 UNIT/ML ~~LOC~~ SOLN
0.0000 [IU] | Freq: Three times a day (TID) | SUBCUTANEOUS | Status: DC
  Administered 2013-09-27: 5 [IU] via SUBCUTANEOUS
  Administered 2013-09-27: 3 [IU] via SUBCUTANEOUS
  Administered 2013-09-28: 8 [IU] via SUBCUTANEOUS
  Administered 2013-09-28: 2 [IU] via SUBCUTANEOUS
  Administered 2013-09-28: 8 [IU] via SUBCUTANEOUS
  Administered 2013-09-29: 11 [IU] via SUBCUTANEOUS
  Administered 2013-09-29: 3 [IU] via SUBCUTANEOUS

## 2013-09-27 MED ORDER — INSULIN ASPART 100 UNIT/ML ~~LOC~~ SOLN
8.0000 [IU] | Freq: Three times a day (TID) | SUBCUTANEOUS | Status: DC
  Administered 2013-09-27 – 2013-09-29 (×7): 8 [IU] via SUBCUTANEOUS

## 2013-09-27 NOTE — Progress Notes (Signed)
D) Pt has been attending the groups and interacting with her peers. Mood and affect are appropriate. Pt rates her depression a 0 and her hopelessness at a 1. States that she is really feeling hopeful today and is also feeling motivated. Denies SI and HI A) Given support reassurance and praise. Encouraged to go to her groups and to work on her work book. R) Denies SI and HI presently.
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
## 2013-09-27 NOTE — Progress Notes (Signed)
.  Psychoeducational Group Note    Date: 09/27/2013 Time: 0930   Goal Setting Purpose of Group: To be able to set a goal that is measurable and that can be accomplished in one day Participation Level:  Active  Participation Quality:  Appropriate  Affect:  Appropriate  Cognitive:  Oriented  Insight:  Improving  Engagement in Group:  Engaged  Additional Comments:    [name] A 

## 2013-09-27 NOTE — Progress Notes (Signed)
The focus of this group is to help patients review their daily goal of treatment and discuss progress on daily workbooks. Pt attended the evening group session and responded to all discussion prompts from the Writer. Pt shared that today was a good day on the unit, the highlight of which was a visit from her young daughter. Pt reported having no additional need from Nursing Staff this evening beyond hygiene supplies, which were given to her following group. Pt's affect was appropriate.

## 2013-09-27 NOTE — Progress Notes (Signed)
Psychoeducational Group Note  Date: 09/27/2013 Time:  1015  Group Topic/Focus:  Identifying Needs:   The focus of this group is to help patients identify their personal needs that have been historically problematic and identify healthy behaviors to address their needs.  Participation Level:  Active  Participation Quality:  Attentive  Affect:  Appropriate  Cognitive:  Oriented  Insight:  Improving  Engagement in Group:  Engaged  Additional Comments:    Tommye Lehenbauer A 

## 2013-09-27 NOTE — Progress Notes (Signed)
Adult Psychoeducational Group Note  Date:  09/27/2013 Time:  2:40 AM  Group Topic/Focus:  Wrap-Up Group:   The focus of this group is to help patients review their daily goal of treatment and discuss progress on daily workbooks.  Participation Level:  None  Participation Quality:  Appropriate  Affect:  Appropriate  Cognitive:  Appropriate  Insight: Appropriate and None  Engagement in Group:  None  Modes of Intervention:  Discussion and Support  Additional Comments:  Pt attended wrap-up group this evening. Pt did not share much in group.   Ayris Carano A 09/27/2013, 2:40 AM

## 2013-09-27 NOTE — BHH Suicide Risk Assessment (Signed)
Suicide Risk Assessment  Admission Assessment     Nursing information obtained from:    Demographic factors:    Current Mental Status:    Loss Factors:    Historical Factors:    Risk Reduction Factors:     CLINICAL FACTORS:   Severe Anxiety and/or Agitation Depression:   Anhedonia Hopelessness Insomnia Recent sense of peace/wellbeing Severe Unstable or Poor Therapeutic Relationship Previous Psychiatric Diagnoses and Treatments Medical Diagnoses and Treatments/Surgeries  COGNITIVE FEATURES THAT CONTRIBUTE TO RISK:  Closed-mindedness Loss of executive function Polarized thinking Thought constriction (tunnel vision)    SUICIDE RISK:   Moderate:  Frequent suicidal ideation with limited intensity, and duration, some specificity in terms of plans, no associated intent, good self-control, limited dysphoria/symptomatology, some risk factors present, and identifiable protective factors, including available and accessible social support.  PLAN OF CARE: Admitted for crisis stabilization, safety margin and medication management. Patient has no previous acute psychiatric hospitalization. Patient has been receiving antidepressant medication from primary care physician. Patient continued to have a suicidal ideation with a plan but no intention.  I certify that inpatient services furnished can reasonably be expected to improve the patient's condition.  Zareah Hunzeker,JANARDHAHA R. 09/27/2013, 12:45 PM

## 2013-09-27 NOTE — BHH Group Notes (Signed)
BHH Group Notes: (Clinical Social Work)   09/27/2013      Type of Therapy:  Group Therapy   Participation Level:  Did Not Attend    Ambrose Mantle, LCSW 09/27/2013, 4:56 PM

## 2013-09-28 LAB — GLUCOSE, CAPILLARY
Glucose-Capillary: 292 mg/dL — ABNORMAL HIGH (ref 70–99)
Glucose-Capillary: 143 mg/dL — ABNORMAL HIGH (ref 70–99)
Glucose-Capillary: 273 mg/dL — ABNORMAL HIGH (ref 70–99)
Glucose-Capillary: 113 mg/dL — ABNORMAL HIGH (ref 70–99)

## 2013-09-28 MED ORDER — HYDROXYZINE HCL 50 MG PO TABS
50.0000 mg | ORAL_TABLET | Freq: Once | ORAL | Status: AC
  Administered 2013-09-28: 50 mg via ORAL
  Filled 2013-09-28: qty 1

## 2013-09-28 MED ORDER — SERTRALINE HCL 25 MG PO TABS
125.0000 mg | ORAL_TABLET | Freq: Every day | ORAL | Status: DC
  Administered 2013-09-29: 125 mg via ORAL
  Filled 2013-09-28 (×3): qty 1

## 2013-09-28 NOTE — Progress Notes (Signed)
Psychoeducational Group Note  Date:  09/28/2013 Time:  1015  Group Topic/Focus:  Making Healthy Choices:   The focus of this group is to help patients identify negative/unhealthy choices they were using prior to admission and identify positive/healthier coping strategies to replace them upon discharge.  Participation Level:  Active  Participation Quality:  Appropriate  Affect:  Appropriate  Cognitive:  Oriented  Insight:  Improving  Engagement in Group:  Engaged  Additional Comments:    Misao Fackrell A 09/28/2013 
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
## 2013-09-28 NOTE — Progress Notes (Signed)
INITIAL NUTRITION ASSESSMENT  DOCUMENTATION CODES Per approved criteria  -Obesity grade 1   INTERVENTION: Recommend patient f/u with [facility] which is free for [employer] employees.  Contact number provided.  Patient to follow up when insurance goes into effect in January.   Discussed need for improved DM control and regular meal intake. Continue CHO MOD diet with choices per patient.  NUTRITION DIAGNOSIS: Predicted sub optimal intake  related to one meal a day as evidenced by patient report.   Goal:   Intake to meet >75% estimated needs.  Monitor:  Per RN with consult prn  Reason for Assessment: Consult for assessment of status and needs  27 y.o. female  Admitting Dx: Depression  ASSESSMENT: Patient admitted with depression, anxiety and  PTSD.  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  Diabetes is not controlled with HgbA1C of 10.5 (09/26/13) which is up from 10.1 (04/30/12).  Meds include lantus and novolog.  Sodium 129- very low on admit.  Pt had plans last week to OD on insulin.    Patient reports eating only one meal a day prior to admit because she did not feel like more.  Uses an insulin pump and counts CHO. Uses CHO free beverages except for milk.   Discussed importance of regular meal intake.  No recent weight los noted.  Patient denies questions.  Discussed that DM is not well controlled.  Height: Ht Readings from Last 1 Encounters:  09/26/13 5\' 5"  (1.651 m)    Weight: Wt Readings from Last 1 Encounters:  09/26/13 186 lb (84.369 kg)    Ideal Body Weight: 125 lbs  % Ideal Body Weight: 149  Wt Readings from Last 10 Encounters:  09/26/13 186 lb (84.369 kg)  09/05/13 190 lb (86.183 kg)  08/21/13 186 lb (84.369 kg)  04/15/13 194 lb (87.998 kg)  02/21/13 192 lb (87.091 kg)  04/30/12 189 lb 9.6 oz (86.002 kg)  10/06/11 186 lb (84.369 kg)  09/06/10 179 lb (81.194 kg)  08/09/10 174 lb (78.926 kg)  08/02/10 173 lb  (78.472 kg)    Usual Body Weight: 186 lbs  % Usual Body Weight: 100  BMI:  Body mass index is 30.95 kg/(m^2).  Estimated Nutritional Needs: Kcal: 1600-1700 Protein: 85-95 gm Fluid: >/=2L daily  Skin: wnl  Diet Order: Carb Control  EDUCATION NEEDS: -No education needs identified at this time  No intake or output data in the 24 hours ending 09/28/13 0959    Labs:   Recent Labs Lab 09/26/13 1208  NA 129*  K 4.4  CL 94*  CO2 26  BUN 15  CREATININE 0.91  CALCIUM 9.2  GLUCOSE 485*    CBG (last 3)   Recent Labs  09/27/13 1716 09/27/13 2121 09/28/13 0609  GLUCAP 228* 267* 292*    Scheduled Meds: . insulin aspart  0-15 Units Subcutaneous TID WC  . insulin aspart  8 Units Subcutaneous TID WC  . insulin glargine  30 Units Subcutaneous QHS  . lisinopril  10 mg Oral Daily  . montelukast  10 mg Oral QHS  . sertraline  100 mg Oral Daily    Continuous Infusions:   Past Medical History  Diagnosis Date  . Asthma   . Tachycardia     baseline tachycardia   . Retinopathy due to secondary diabetes   . Diabetic gastroparesis   . 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 Diabetic neuropathy, type I diabetes  mellitus   . Polyneuropathy in diabetes(357.2)   . Anxiety   . Hypertension     Past Surgical History  Procedure Laterality Date  . Cholecystectomy    . Cesarean section    . Refractive surgery      Oran Rein, RD, LDN Clinical Inpatient Dietitian Pager:  [phone] Weekend and after hours pager:  [phone]

## 2013-09-28 NOTE — [facility] Group Notes (Signed)
[facility] Group Notes:  (Clinical Social Work)  09/28/2013   1:15-2:20PM  Summary of Progress/Problems:  The main focus of today's process group was to   identify the patient's current support system and decide on other supports that can be put in place.  The picture on workbook was used to discuss why additional supports are needed.  An emphasis was placed on using counselor, doctor, therapy groups, 12-step groups, and problem-specific support groups to expand supports.   There was also an extensive discussion about what constitutes a healthy support versus an unhealthy support.  The patient expressed full comprehension of the concepts presented, and agreed that there is a need to add more supports.    Type of Therapy:  Process Group  Participation Level:  Active  Participation Quality:  Attentive and Sharing, Supportive  Affect:  Appropriate  Cognitive:  Appropriate and Oriented  Insight:  Engaged  Engagement in Therapy:  Engaged  Modes of Intervention:  Education,  Support and [employer], LCSW 09/28/2013, 4:00pm
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
## 2013-09-28 NOTE — Progress Notes (Signed)
D) Pt requested a 1:1 after lunch. States that her fiance came and visited with her for the meal, and then she became anxious asking for a prn to calm her nerves. Pt states that she loves her fiance but is "not in love with him". States that she realizes her anxiety increases when he is around. Feels "stuck" because their names are both on a lease to an apartment and her daughter calls this man "Daddy". Tearful when talking about her relationship with him. States she would like to get out of the relationship, but feels it is an impossible thing to do right now. A) Given support, active listening and homework to write out the pros and cons of the relationship. Encouraged to be honest with herself and to able to identify her own strengths and weaknessess. R) Denies Si and HI . Rats her depression at a 3 and her hopelessness at a 1. Denies SI and HI

## 2013-09-28 NOTE — Progress Notes (Signed)
Psychoeducational Group Note  Date: 09/28/2013 Time:  0930  Group Topic/Focus:  Gratefulness:  The focus of this group is to help patients identify what two things they are most grateful for in their lives. What helps ground them and to center them on their work to their recovery.  Participation Level:  Partisipated  Participation Quality:  Appropriate  Affect:  Appropriate  Cognitive:  Oriented  Insight:  Improving  Engagement in Group:  Engaged  Additional Comments:    Toron Bowring A  

## 2013-09-28 NOTE — Progress Notes (Signed)
[facility] MD Progress Note  09/28/2013 3:41 PM [name]  MRN:  [id number] Subjective:  Patient's Zoloft was increased to assist in her depressive state, sleep was fair, appetite is "good", depression is "better", much more communicative today.  She feels supported by staff, clients, and family.  She communicated to her fiance that she would need more help at home and he agreed to assist more.  [name] has also got help to deal with a problem at work from her boss.   Diagnosis:   DSM5:  Trauma-Stressor Disorders:  Posttraumatic Stress Disorder (309.81) Depressive Disorders:  Major Depressive Disorder - Severe (296.23)  Axis I: Anxiety Disorder NOS and Major Depression, Recurrent severe Axis II: Deferred Axis III:  Past Medical History  Diagnosis Date  . Asthma   . Tachycardia     baseline tachycardia   . Retinopathy due to secondary diabetes   . Diabetic gastroparesis   . 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 Diabetic neuropathy, type I diabetes mellitus   . Polyneuropathy in diabetes(357.2)   . Anxiety   . Hypertension    Axis IV: other psychosocial or environmental problems, problems related to social environment and problems with primary support group Axis V: 41-50 serious symptoms  ADL's:  Intact  Sleep: Fair  Appetite:  Fair  Suicidal Ideation:  Plan:  vague Intent:  none Means:  none Homicidal Ideation:  Denies   Psychiatric Specialty Exam: Review of Systems  Constitutional: Negative.   HENT: Negative.   Eyes: Negative.   Respiratory: Negative.   Cardiovascular: Negative.   Gastrointestinal: Negative.   Genitourinary: Negative.   Musculoskeletal: Negative.   Skin: Negative.   Neurological: Negative.   Endo/Heme/Allergies: Negative.   Psychiatric/Behavioral: Positive for depression. The patient is nervous/anxious.     Blood pressure 130/68, pulse 103, temperature 97.8 F (36.6 C), temperature source Oral, resp.  rate 18, height 5\' 5"  (1.651 m), weight 84.369 kg (186 lb), last menstrual period 09/19/2013.Body mass index is 30.95 kg/(m^2).  General Appearance: Casual  Eye Contact::  Fair  Speech:  Normal Rate  Volume:  Normal  Mood:  Anxious and Depressed  Affect:  Congruent  Thought Process:  Coherent  Orientation:  Full (Time, Place, and Person)  Thought Content:  WDL  Suicidal Thoughts:  No  Homicidal Thoughts:  No  Memory:  Immediate;   Fair Recent;   Fair Remote;   Fair  Judgement:  Fair  Insight:  Fair  Psychomotor Activity:  Normal  Concentration:  Fair  Recall:  Fair  Akathisia:  No  Handed:  Right  AIMS (if indicated):     Assets:  Leisure Time Physical Health Resilience Social Support  Sleep:  Number of Hours: 5.75   Current Medications: Current Facility-Administered Medications  Medication Dose Route Frequency Provider Last Rate Last Dose  . acetaminophen (TYLENOL) tablet 650 mg  650 mg Oral Q6H PRN Caprice Kluver, MD   650 mg at 09/28/13 0840  . albuterol (PROVENTIL HFA;VENTOLIN HFA) 108 (90 BASE) MCG/ACT inhaler 2 puff  2 puff Inhalation Q4H PRN Nanine Means, NP   2 puff at 09/27/13 2007  . ALPRAZolam Prudy Feeler) tablet 0.5 mg  0.5 mg Oral BID PRN Nanine Means, NP   0.5 mg at 09/27/13 1828  . alum & mag hydroxide-simeth (MAALOX/MYLANTA) 200-200-20 MG/5ML suspension 30 mL  30 mL Oral Q4H PRN Vinay P Saranga, MD      . insulin aspart (novoLOG) injection 0-15 Units  0-15 Units Subcutaneous TID WC Nanine Means, NP   2 Units at 09/28/13 1203  . insulin aspart (novoLOG) injection 8 Units  8 Units Subcutaneous TID WC Nanine Means, NP   8 Units at 09/28/13 1203  . insulin glargine (LANTUS) injection 30 Units  30 Units Subcutaneous QHS Kristeen Mans, NP   30 Units at 09/27/13 2212  . lisinopril (PRINIVIL,ZESTRIL) tablet 10 mg  10 mg Oral Daily Nanine Means, NP   10 mg at 09/28/13 0754  . magnesium hydroxide (MILK OF MAGNESIA) suspension 30 mL  30 mL Oral Daily PRN Caprice Kluver, MD       . montelukast (SINGULAIR) tablet 10 mg  10 mg Oral QHS Nanine Means, NP   10 mg at 09/27/13 2212  . [START ON 09/29/2013] sertraline (ZOLOFT) tablet 125 mg  125 mg Oral Daily Nanine Means, NP        Lab Results:  Results for orders placed during the hospital encounter of 09/26/13 (from the past 48 hour(s))  GLUCOSE, CAPILLARY     Status: Abnormal   Collection Time    09/26/13  4:57 PM      Result Value Range   Glucose-Capillary 230 (*) 70 - 99 mg/dL  HEMOGLOBIN X3K     Status: Abnormal   Collection Time    09/26/13  7:50 PM      Result Value Range   Hemoglobin A1C 10.5 (*) <5.7 %   Comment: (NOTE)                                                                               According to the ADA Clinical Practice Recommendations for 2011, when     HbA1c is used as a screening test:      >=6.5%   Diagnostic of Diabetes Mellitus               (if abnormal result is confirmed)     5.7-6.4%   Increased risk of developing Diabetes Mellitus     References:Diagnosis and Classification of Diabetes Mellitus,Diabetes     Care,2011,34(Suppl 1):S62-S69 and Standards of Medical Care in             Diabetes - 2011,Diabetes Care,2011,34 (Suppl 1):S11-S61.   Mean Plasma Glucose 255 (*) <117 mg/dL   Comment: Performed at [facility]  GLUCOSE, CAPILLARY     Status: Abnormal   Collection Time    09/26/13  9:07 PM      Result Value Range   Glucose-Capillary 284 (*) 70 - 99 mg/dL   Comment 1 Notify RN    GLUCOSE, CAPILLARY     Status: Abnormal   Collection Time    09/27/13  6:17 AM      Result Value Range   Glucose-Capillary 247 (*) 70 - 99 mg/dL  GLUCOSE, CAPILLARY     Status: Abnormal   Collection Time    09/27/13 11:56 AM      Result Value Range   Glucose-Capillary 157 (*) 70 - 99 mg/dL  GLUCOSE, CAPILLARY     Status: Abnormal   Collection Time    09/27/13  5:16 PM      Result Value Range  Glucose-Capillary 228 (*) 70 - 99 mg/dL  GLUCOSE, CAPILLARY     Status: Abnormal    Collection Time    09/27/13  9:21 PM      Result Value Range   Glucose-Capillary 267 (*) 70 - 99 mg/dL   Comment 1 Notify RN    GLUCOSE, CAPILLARY     Status: Abnormal   Collection Time    09/28/13  6:09 AM      Result Value Range   Glucose-Capillary 292 (*) 70 - 99 mg/dL  GLUCOSE, CAPILLARY     Status: Abnormal   Collection Time    09/28/13 11:55 AM      Result Value Range   Glucose-Capillary 143 (*) 70 - 99 mg/dL   Comment 1 Notify RN      Physical Findings: AIMS: Facial and Oral Movements Muscles of Facial Expression: None, normal Lips and Perioral Area: None, normal Jaw: None, normal Tongue: None, normal,Extremity Movements Upper (arms, wrists, hands, fingers): None, normal Lower (legs, knees, ankles, toes): None, normal, Trunk Movements Neck, shoulders, hips: None, normal, Overall Severity Severity of abnormal movements (highest score from questions above): None, normal Incapacitation due to abnormal movements: None, normal Patient's awareness of abnormal movements (rate only patient's report): No Awareness, Dental Status Current problems with teeth and/or dentures?: No Does patient usually wear dentures?: No  CIWA:    COWS:     Treatment Plan Summary: Daily contact with patient to assess and evaluate symptoms and progress in treatment Medication management  Plan:  Review of chart, vital signs, medications, and notes. 1-Individual and group therapy 2-Medication management for depression and anxiety:  Medications reviewed with the patient and Zoloft increased 3-Coping skills for depression, anxiety 4-Continue crisis stabilization and management 5-Address health issues--monitoring vital signs, stable--POCT has improved with the moderate sliding scale insulin 6-Treatment plan in progress to prevent relapse of depression and anxiety  Medical Decision Making Problem Points:  Established problem, stable/improving (1) and Review of psycho-social stressors (1) Data  Points:  Review of new medications or change in dosage (2)  I certify that inpatient services furnished can reasonably be expected to improve the patient's condition.   Nanine Means, PMh-NP 09/28/2013, 3:41 PM  Reviewed the information documented and agree with the treatment plan.  Kali Ambler,JANARDHAHA R. 09/29/2013 12:36 PM

## 2013-09-29 LAB — GLUCOSE, CAPILLARY: Glucose-Capillary: 185 mg/dL — ABNORMAL HIGH (ref 70–99)

## 2013-09-29 MED ORDER — BRIMONIDINE TARTRATE 0.15 % OP SOLN
1.0000 [drp] | Freq: Three times a day (TID) | OPHTHALMIC | Status: DC
Start: 2013-09-29 — End: 2013-10-07

## 2013-09-29 MED ORDER — ALPRAZOLAM 0.5 MG PO TABS: 0.5000 mg | ORAL_TABLET | Freq: Two times a day (BID) | ORAL | Status: DC | PRN

## 2013-09-29 MED ORDER — SERTRALINE HCL 25 MG PO TABS: 125.0000 mg | ORAL_TABLET | Freq: Every day | ORAL | Status: DC

## 2013-09-29 NOTE — Progress Notes (Signed)
D:  Patient's self inventory sheet, patient sleeps well, good appetite, normal energy level, good attention span.  Rated depression 2, hopelessness 1, denied anxiety.  Denied withdrawals.  Denied SI.  Denied physical problems.   Plans to "better self esteem, surround myself with positive people, stay consistent with therapy."  No problems taking meds after discharge. A:  Medications administered per MD orders.  Emotional support and encouragement given patient. R:  Denied SI and HI.  Denied A/V hallucinations.  Denied pain.  Will continue to monitor patient for safety with 15 minute checks.  Safety maintained.

## 2013-09-29 NOTE — Clinical Social Work Note (Signed)
Writer spoke with father who advised of concerns that patient is planning to quit one boyfriend to start seeing someone and that she wants to move into the house with he and her mother.  Father advised they are not going to allow patient to move in with them but does not feels he is able to live on her own.  Writer advised father these are valid concerns for he and her mother but do not meet criteria for continued hospitalization.  Writer asked if he had concerns that she is planning to kill herself or someone else to which he responded no. Writer advised Clinical research associate would address issues with MD and call him back.  Writer spoke with MD who agreed concerns are not appropriate for continued hospital stay.  Writer call father and advised of MD's decision to discharge patient today.  Father to pick patient up for discharge.

## 2013-09-29 NOTE — Progress Notes (Signed)
Discharge Note:  Patient discharged home with family member.  Denied SI and HI.  Denied A/V hallucinations.  Denied pain.  Suicide prevention information given and discussed with patient who stated she understood and had no questions.  Patient received all her belongings, clothing, toiletries, miscellaneous items, prescriptions, medications.  Patient stated she appreciated all assistance received from [facility] staff.

## 2013-09-29 NOTE — Tx Team (Signed)
Interdisciplinary Treatment Plan Update   Date Reviewed:  09/29/2013  Time Reviewed:  10:10 AM  Progress in Treatment:   Attending groups: Yes Participating in groups: Yes Taking medication as prescribed: Yes  Tolerating medication: Yes Family/Significant other contact made: Yes, collateral contact made with family.  Patient understands diagnosis: Yes  Discussing patient identified problems/goals with staff: Yes Medical problems stabilized or resolved: Yes Denies suicidal/homicidal ideation: Yes Patient has not harmed self or others: Yes  For review of initial/current patient goals, please see plan of care.  Estimated Length of Stay:  Discharge today  Reasons for Continued Hospitalization:   New Problems/Goals identified:    Discharge Plan or Barriers:   Home with outpatient follow up [facility]  Additional Comments: N/A  Attendees:  Patient:  09/29/2013 10:10 AM   Signature: Mervyn Gay, MD 09/29/2013 10:10 AM  Signature:  Silverio Decamp, PA 09/29/2013 10:10 AM  Signature: 09/29/2013 10:10 AM  Signature:Beverly Terrilee Croak, RN 09/29/2013 10:10 AM  Signature:  Neill Loft RN 09/29/2013 10:10 AM  Signature:  Horace Porteous Ladarious Kresse, LCSW 09/29/2013 10:10 AM  Signature:   09/29/2013 10:10 AM  Signature:   09/29/2013 10:10 AM  Signature:  Aloha Gell, RN 09/29/2013 10:10 AM  Signature:  09/29/2013  10:10 AM  Signature:   Onnie Boer, RN [facility] 09/29/2013  10:10 AM  Signature:  Elizbeth Squires, Team Lead [place] 09/29/2013  10:10 AM    Scribe for Treatment Team:   Juline Patch,  09/29/2013 10:10 AM

## 2013-09-29 NOTE — Progress Notes (Signed)
[facility] Adult Case Management Discharge Plan :  Will you be returning to the same living situation after discharge: Yes,  Patient is returning to her home. At discharge, do you have transportation home?:Yes,  Patient can arrange transportation. Do you have the ability to pay for your medications:Yes,  Patient has Medicaid.     Release of information consent forms completed and in the chart;  Patient's signature needed at discharge.  Patient to Follow up at: Follow-up Information   Follow up with Dr. Lolly Mustache - [facility] On 10/23/2013. (Thursday, October 23, 2013 at 3 PM)    Contact information:   [address] [place], [place]   [address]  [phone]      Follow up with   [facility] On 10/15/2013. Frazier Butt on Wednsday, October 15, 2012 at 10 AM .  Please complete registration form and bring to appointment. You will need to arrive at 9:30)    Contact information:   [phone]      Patient denies SI/HI:   Patient no longer endorsing SI/HI or other thoughts of self harm.   Safety Planning and Suicide Prevention discussed: .Reviewed with all patients during discharge planning group   Lulie Hurd, [place] July 09/29/2013, 11:41 AM

## 2013-09-29 NOTE — [facility] Suicide Risk Assessment (Signed)
Suicide Risk Assessment  Discharge Assessment     Demographic Factors:  Adolescent or young adult, Caucasian and Low socioeconomic status  Mental Status Per Nursing Assessment::   On Admission:     Current Mental Status by Physician: The patient was calm and cooperative. Patient has normal psychomotor activity. Patient has normal speech and thought process. Patient has good mood with appropriate affect. Patient has denied suicide and homicidal ideations, intentions and plans. Patient has no evidence of psychotic symptoms. Patient has fair insight judgment and impulse  Loss Factors: Financial problems/change in socioeconomic status  Historical Factors: Family history of mental illness or substance abuse and Impulsivity  Risk Reduction Factors:   Sense of responsibility to family, Religious beliefs about death, Living with another person, especially a relative, Positive social support, Positive therapeutic relationship and Positive coping skills or problem solving skills  Continued Clinical Symptoms:  Depression:   Recent sense of peace/wellbeing Previous Psychiatric Diagnoses and Treatments  Cognitive Features That Contribute To Risk:  Polarized thinking    Suicide Risk:  Minimal: No identifiable suicidal ideation.  Patients presenting with no risk factors but with morbid ruminations; may be classified as minimal risk based on the severity of the depressive symptoms  Discharge Diagnoses:   AXIS I:  Generalized Anxiety Disorder and Major Depression, Recurrent severe AXIS II:  Deferred AXIS III:   Past Medical History  Diagnosis Date  . Asthma   . Tachycardia     baseline tachycardia   . Retinopathy due to secondary diabetes   . Diabetic gastroparesis   . 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 Diabetic neuropathy, type I diabetes mellitus   . Polyneuropathy in diabetes(357.2)   . Anxiety   . Hypertension    AXIS IV:  other  psychosocial or environmental problems, problems related to social environment and problems with primary support group AXIS V:  61-70 mild symptoms  Plan Of Care/Follow-up recommendations:  Activity:  As tolerated Diet:   reggullar  Is patient on multiple antipsychotic therapies at discharge:  No   Has Patient had three or more failed trials of antipsychotic monotherapy by history:  No  Recommended Plan for Multiple Antipsychotic Therapies: NA  Shanitra Phillippi,JANARDHAHA R. 09/29/2013, 1:15 PM
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
## 2013-09-30 NOTE — Progress Notes (Signed)
Patient Discharge Instructions:  Next Level Care Provider Has Access to the EMR, 09/30/13 Records provided to [facility] via [facility]/Epic access.  Jerelene Redden, 09/30/2013, 1:42 PM

## 2013-10-02 ENCOUNTER — Emergency Department (HOSPITAL_COMMUNITY)
Admission: EM | Admit: 2013-10-02 | Discharge: 2013-10-03 | Disposition: A | Discharge status: Home / Self Care | Payer: Medicaid Other | Attending: Emergency Medicine | Admitting: Emergency Medicine

## 2013-10-02 DIAGNOSIS — E11311 Type 2 diabetes mellitus with unspecified diabetic retinopathy with macular edema: Secondary | ICD-10-CM | POA: Insufficient documentation

## 2013-10-02 DIAGNOSIS — Z9889 Other specified postprocedural states: Secondary | ICD-10-CM | POA: Insufficient documentation

## 2013-10-02 DIAGNOSIS — F419 Anxiety disorder, unspecified: Secondary | ICD-10-CM

## 2013-10-02 DIAGNOSIS — Z79899 Other long term (current) drug therapy: Secondary | ICD-10-CM | POA: Insufficient documentation

## 2013-10-02 DIAGNOSIS — E1142 Type 2 diabetes mellitus with diabetic polyneuropathy: Secondary | ICD-10-CM | POA: Insufficient documentation

## 2013-10-02 DIAGNOSIS — E162 Hypoglycemia, unspecified: Secondary | ICD-10-CM

## 2013-10-02 DIAGNOSIS — I10 Essential (primary) hypertension: Secondary | ICD-10-CM | POA: Insufficient documentation

## 2013-10-02 DIAGNOSIS — F411 Generalized anxiety disorder: Secondary | ICD-10-CM | POA: Insufficient documentation

## 2013-10-02 DIAGNOSIS — F329 Major depressive disorder, single episode, unspecified: Secondary | ICD-10-CM | POA: Insufficient documentation

## 2013-10-02 DIAGNOSIS — Z794 Long term (current) use of insulin: Secondary | ICD-10-CM | POA: Insufficient documentation

## 2013-10-02 DIAGNOSIS — E1039 Type 1 diabetes mellitus with other diabetic ophthalmic complication: Secondary | ICD-10-CM | POA: Insufficient documentation

## 2013-10-02 DIAGNOSIS — J45909 Unspecified asthma, uncomplicated: Secondary | ICD-10-CM | POA: Insufficient documentation

## 2013-10-02 DIAGNOSIS — K3184 Gastroparesis: Secondary | ICD-10-CM | POA: Insufficient documentation

## 2013-10-02 DIAGNOSIS — E119 Type 2 diabetes mellitus without complications: Secondary | ICD-10-CM

## 2013-10-02 DIAGNOSIS — E1049 Type 1 diabetes mellitus with other diabetic neurological complication: Secondary | ICD-10-CM | POA: Insufficient documentation

## 2013-10-02 DIAGNOSIS — E1069 Type 1 diabetes mellitus with other specified complication: Secondary | ICD-10-CM | POA: Insufficient documentation

## 2013-10-02 DIAGNOSIS — F4310 Post-traumatic stress disorder, unspecified: Secondary | ICD-10-CM | POA: Insufficient documentation

## 2013-10-02 DIAGNOSIS — IMO0002 Reserved for concepts with insufficient information to code with codable children: Secondary | ICD-10-CM | POA: Insufficient documentation

## 2013-10-02 DIAGNOSIS — F3289 Other specified depressive episodes: Secondary | ICD-10-CM | POA: Insufficient documentation

## 2013-10-03 LAB — GLUCOSE, CAPILLARY
Glucose-Capillary: 155 mg/dL — ABNORMAL HIGH (ref 70–99)
Glucose-Capillary: 413 mg/dL — ABNORMAL HIGH (ref 70–99)
Glucose-Capillary: 46 mg/dL — ABNORMAL LOW (ref 70–99)
Glucose-Capillary: 82 mg/dL (ref 70–99)

## 2013-10-03 LAB — BASIC METABOLIC PANEL
BUN: 17 mg/dL (ref 6–23)
Calcium: 8.1 mg/dL — ABNORMAL LOW (ref 8.4–10.5)
GFR calc non Af Amer: >90 mL/min (ref >90)
Glucose, Bld: 112 mg/dL — ABNORMAL HIGH (ref 70–99)

## 2013-10-03 LAB — URINALYSIS, ROUTINE W REFLEX MICROSCOPIC
Leukocytes, UA: NEGATIVE
Protein, ur: NEGATIVE mg/dL
Urobilinogen, UA: 0.2 mg/dL (ref 0.0–1.0)
pH: 5 (ref 5.0–8.0)

## 2013-10-03 LAB — CBC WITH DIFFERENTIAL/PLATELET
Basophils Absolute: 0 10*3/uL (ref 0.0–0.1)
Basophils Relative: 0 % (ref 0–1)
Eosinophils Absolute: 0.4 10*3/uL (ref 0.0–0.7)
MCH: 28.7 pg (ref 26.0–34.0)
MCHC: 34.2 g/dL (ref 30.0–36.0)
Neutrophils Relative %: 63 % (ref 43–77)
Platelets: 221 10*3/uL (ref 150–400)
RDW: 12.7 % (ref 11.5–15.5)

## 2013-10-03 LAB — URINE MICROSCOPIC-ADD ON

## 2013-10-03 LAB — POCT PREGNANCY, URINE: Preg Test, Ur: NEGATIVE

## 2013-10-03 MED ORDER — DEXTROSE 50 % IV SOLN
INTRAVENOUS | Status: AC
  Filled 2013-10-03: qty 50

## 2013-10-03 MED ORDER — DEXTROSE 50 % IV SOLN
50.0000 mL | Freq: Once | INTRAVENOUS | Status: AC
  Administered 2013-10-03: 50 mL via INTRAVENOUS

## 2013-10-03 MED ORDER — POTASSIUM CHLORIDE CRYS ER 20 MEQ PO TBCR
40.0000 meq | EXTENDED_RELEASE_TABLET | Freq: Once | ORAL | Status: AC
  Administered 2013-10-03: 40 meq via ORAL
  Filled 2013-10-03: qty 2

## 2013-10-03 NOTE — ED Notes (Signed)
CBG-[id number]. Notified RN 

## 2013-10-03 NOTE — ED Notes (Signed)
CBG-72. Notified RN

## 2013-10-03 NOTE — ED Notes (Signed)
Pt. Family concerned that patient purposely overdosed on insulin as a SI plan. Pt. Denies SI or plan.

## 2013-10-03 NOTE — ED Notes (Signed)
Dr Lavella Lemons aware of CBG, orders received
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
## 2013-10-03 NOTE — ED Notes (Signed)
No-Harm contract signed by pt and boyfriend and faxed back to [facility].
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
## 2013-10-03 NOTE — ED Notes (Signed)
Md at bedside

## 2013-10-03 NOTE — ED Notes (Signed)
CBG-413. Notified RN

## 2013-10-03 NOTE — ED Notes (Signed)
CBG 267 

## 2013-10-03 NOTE — ED Notes (Signed)
Telepsych being conducted at this time.
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
## 2013-10-07 ENCOUNTER — Ambulatory Visit (INDEPENDENT_AMBULATORY_CARE_PROVIDER_SITE_OTHER): Payer: Medicaid Other | Admitting: Family Medicine

## 2013-10-07 ENCOUNTER — Encounter: Payer: Self-pay | Admitting: Family Medicine

## 2013-10-07 VITALS — BP 110/64 | HR 92 | Temp 98.3°F | Resp 16 | Ht 66.0 in | Wt 194.0 lb

## 2013-10-07 DIAGNOSIS — F329 Major depressive disorder, single episode, unspecified: Secondary | ICD-10-CM

## 2013-10-07 DIAGNOSIS — F32A Depression, unspecified: Secondary | ICD-10-CM

## 2013-10-07 MED ORDER — DULOXETINE HCL 60 MG PO CPEP: 60.0000 mg | ORAL_CAPSULE | Freq: Every day | ORAL | Status: DC

## 2013-10-07 MED ORDER — ALPRAZOLAM 0.5 MG PO TABS: 0.5000 mg | ORAL_TABLET | Freq: Two times a day (BID) | ORAL | Status: DC | PRN
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
## 2013-10-10 ENCOUNTER — Inpatient Hospital Stay (HOSPITAL_COMMUNITY): Admission: EM | Admit: 2013-10-10 | Payer: Medicaid Other | Source: Intra-hospital | Admitting: Psychiatry

## 2013-10-10 ENCOUNTER — Emergency Department (HOSPITAL_COMMUNITY): Payer: 59

## 2013-10-10 ENCOUNTER — Encounter (HOSPITAL_COMMUNITY): Payer: Self-pay | Admitting: Emergency Medicine

## 2013-10-10 ENCOUNTER — Inpatient Hospital Stay (HOSPITAL_COMMUNITY)
Admission: EM | Admit: 2013-10-10 | Discharge: 2013-10-14 | DRG: 880 | Disposition: A | Discharge status: Other Acute Inpatient Hospital | Payer: 59 | Attending: Internal Medicine | Admitting: Internal Medicine

## 2013-10-10 DIAGNOSIS — E11319 Type 2 diabetes mellitus with unspecified diabetic retinopathy without macular edema: Secondary | ICD-10-CM | POA: Diagnosis present

## 2013-10-10 DIAGNOSIS — R7989 Other specified abnormal findings of blood chemistry: Secondary | ICD-10-CM | POA: Diagnosis present

## 2013-10-10 DIAGNOSIS — H3581 Retinal edema: Secondary | ICD-10-CM | POA: Diagnosis present

## 2013-10-10 DIAGNOSIS — T1491XA Suicide attempt, initial encounter: Secondary | ICD-10-CM | POA: Diagnosis present

## 2013-10-10 DIAGNOSIS — E1010 Type 1 diabetes mellitus with ketoacidosis without coma: Secondary | ICD-10-CM

## 2013-10-10 DIAGNOSIS — I498 Other specified cardiac arrhythmias: Secondary | ICD-10-CM

## 2013-10-10 DIAGNOSIS — E1142 Type 2 diabetes mellitus with diabetic polyneuropathy: Secondary | ICD-10-CM

## 2013-10-10 DIAGNOSIS — R3919 Other difficulties with micturition: Secondary | ICD-10-CM

## 2013-10-10 DIAGNOSIS — IMO0002 Reserved for concepts with insufficient information to code with codable children: Secondary | ICD-10-CM

## 2013-10-10 DIAGNOSIS — E108 Type 1 diabetes mellitus with unspecified complications: Secondary | ICD-10-CM

## 2013-10-10 DIAGNOSIS — Y9210 Unspecified residential institution as the place of occurrence of the external cause: Secondary | ICD-10-CM | POA: Diagnosis present

## 2013-10-10 DIAGNOSIS — E1065 Type 1 diabetes mellitus with hyperglycemia: Secondary | ICD-10-CM | POA: Diagnosis present

## 2013-10-10 DIAGNOSIS — E119 Type 2 diabetes mellitus without complications: Secondary | ICD-10-CM

## 2013-10-10 DIAGNOSIS — T383X1A Poisoning by insulin and oral hypoglycemic [antidiabetic] drugs, accidental (unintentional), initial encounter: Secondary | ICD-10-CM | POA: Diagnosis present

## 2013-10-10 DIAGNOSIS — R739 Hyperglycemia, unspecified: Secondary | ICD-10-CM | POA: Diagnosis present

## 2013-10-10 DIAGNOSIS — K561 Intussusception: Secondary | ICD-10-CM

## 2013-10-10 DIAGNOSIS — E1049 Type 1 diabetes mellitus with other diabetic neurological complication: Secondary | ICD-10-CM | POA: Diagnosis present

## 2013-10-10 DIAGNOSIS — K8020 Calculus of gallbladder without cholecystitis without obstruction: Secondary | ICD-10-CM

## 2013-10-10 DIAGNOSIS — E1039 Type 1 diabetes mellitus with other diabetic ophthalmic complication: Secondary | ICD-10-CM | POA: Diagnosis present

## 2013-10-10 DIAGNOSIS — Z8679 Personal history of other diseases of the circulatory system: Secondary | ICD-10-CM | POA: Diagnosis present

## 2013-10-10 DIAGNOSIS — Z8639 Personal history of other endocrine, nutritional and metabolic disease: Secondary | ICD-10-CM

## 2013-10-10 DIAGNOSIS — T50992A Poisoning by other drugs, medicaments and biological substances, intentional self-harm, initial encounter: Secondary | ICD-10-CM | POA: Diagnosis present

## 2013-10-10 DIAGNOSIS — X838XXA Intentional self-harm by other specified means, initial encounter: Secondary | ICD-10-CM

## 2013-10-10 DIAGNOSIS — J90 Pleural effusion, not elsewhere classified: Secondary | ICD-10-CM

## 2013-10-10 DIAGNOSIS — E10319 Type 1 diabetes mellitus with unspecified diabetic retinopathy without macular edema: Secondary | ICD-10-CM

## 2013-10-10 DIAGNOSIS — F32A Depression, unspecified: Secondary | ICD-10-CM

## 2013-10-10 DIAGNOSIS — Z862 Personal history of diseases of the blood and blood-forming organs and certain disorders involving the immune mechanism: Secondary | ICD-10-CM | POA: Diagnosis present

## 2013-10-10 DIAGNOSIS — Z87448 Personal history of other diseases of urinary system: Secondary | ICD-10-CM

## 2013-10-10 DIAGNOSIS — S52532D Colles' fracture of left radius, subsequent encounter for closed fracture with routine healing: Secondary | ICD-10-CM

## 2013-10-10 DIAGNOSIS — S62102A Fracture of unspecified carpal bone, left wrist, initial encounter for closed fracture: Secondary | ICD-10-CM

## 2013-10-10 DIAGNOSIS — R1012 Left upper quadrant pain: Secondary | ICD-10-CM

## 2013-10-10 DIAGNOSIS — E1069 Type 1 diabetes mellitus with other specified complication: Secondary | ICD-10-CM

## 2013-10-10 DIAGNOSIS — R110 Nausea: Secondary | ICD-10-CM

## 2013-10-10 DIAGNOSIS — Z9289 Personal history of other medical treatment: Secondary | ICD-10-CM

## 2013-10-10 DIAGNOSIS — F489 Nonpsychotic mental disorder, unspecified: Principal | ICD-10-CM | POA: Diagnosis present

## 2013-10-10 DIAGNOSIS — S52539A Colles' fracture of unspecified radius, initial encounter for closed fracture: Secondary | ICD-10-CM | POA: Diagnosis present

## 2013-10-10 DIAGNOSIS — Z794 Long term (current) use of insulin: Secondary | ICD-10-CM

## 2013-10-10 DIAGNOSIS — X80XXXA Intentional self-harm by jumping from a high place, initial encounter: Secondary | ICD-10-CM | POA: Diagnosis present

## 2013-10-10 DIAGNOSIS — J45909 Unspecified asthma, uncomplicated: Secondary | ICD-10-CM | POA: Diagnosis present

## 2013-10-10 DIAGNOSIS — I10 Essential (primary) hypertension: Secondary | ICD-10-CM | POA: Diagnosis present

## 2013-10-10 DIAGNOSIS — Z8719 Personal history of other diseases of the digestive system: Secondary | ICD-10-CM

## 2013-10-10 DIAGNOSIS — Z79899 Other long term (current) drug therapy: Secondary | ICD-10-CM

## 2013-10-10 DIAGNOSIS — F329 Major depressive disorder, single episode, unspecified: Secondary | ICD-10-CM

## 2013-10-10 DIAGNOSIS — S62109A Fracture of unspecified carpal bone, unspecified wrist, initial encounter for closed fracture: Secondary | ICD-10-CM

## 2013-10-10 DIAGNOSIS — F332 Major depressive disorder, recurrent severe without psychotic features: Secondary | ICD-10-CM | POA: Diagnosis present

## 2013-10-10 DIAGNOSIS — K3184 Gastroparesis: Secondary | ICD-10-CM

## 2013-10-10 DIAGNOSIS — Z9641 Presence of insulin pump (external) (internal): Secondary | ICD-10-CM

## 2013-10-10 DIAGNOSIS — F4310 Post-traumatic stress disorder, unspecified: Secondary | ICD-10-CM | POA: Diagnosis present

## 2013-10-10 DIAGNOSIS — S52532A Colles' fracture of left radius, initial encounter for closed fracture: Secondary | ICD-10-CM

## 2013-10-10 DIAGNOSIS — F411 Generalized anxiety disorder: Secondary | ICD-10-CM | POA: Diagnosis present

## 2013-10-10 DIAGNOSIS — Z881 Allergy status to other antibiotic agents status: Secondary | ICD-10-CM

## 2013-10-10 HISTORY — DX: Major depressive disorder, single episode, unspecified: F32.9

## 2013-10-10 HISTORY — DX: Depression, unspecified: F32.A

## 2013-10-10 LAB — COMPREHENSIVE METABOLIC PANEL
ALK PHOS: 132 U/L — AB (ref 39–117)
ALT: 10 U/L (ref 0–35)
AST: 12 U/L (ref 0–37)
Albumin: 3.6 g/dL (ref 3.5–5.2)
BUN: 14 mg/dL (ref 6–23)
CO2: 28 mEq/L (ref 19–32)
Calcium: 9.4 mg/dL (ref 8.4–10.5)
Chloride: 100 mEq/L (ref 96–112)
Creatinine, Ser: 0.98 mg/dL (ref 0.50–1.10)
GFR calc non Af Amer: 78 mL/min — ABNORMAL LOW (ref >90)
GLUCOSE: 87 mg/dL (ref 70–99)
POTASSIUM: 4.5 meq/L (ref 3.7–5.3)
SODIUM: 140 meq/L (ref 137–147)
TOTAL PROTEIN: 7.2 g/dL (ref 6.0–8.3)
Total Bilirubin: <0.2 mg/dL — ABNORMAL LOW (ref 0.3–1.2)

## 2013-10-10 LAB — CBC
HCT: 41.8 % (ref 36.0–46.0)
HEMOGLOBIN: 14.1 g/dL (ref 12.0–15.0)
MCH: 28.7 pg (ref 26.0–34.0)
MCHC: 33.7 g/dL (ref 30.0–36.0)
MCV: 85 fL (ref 78.0–100.0)
Platelets: 314 10*3/uL (ref 150–400)
RBC: 4.92 MIL/uL (ref 3.87–5.11)
RDW: 12.9 % (ref 11.5–15.5)
WBC: 8.4 10*3/uL (ref 4.0–10.5)

## 2013-10-10 LAB — RAPID URINE DRUG SCREEN, HOSP PERFORMED
Amphetamines: NOT DETECTED
BARBITURATES: NOT DETECTED
BENZODIAZEPINES: POSITIVE — AB
COCAINE: NOT DETECTED
Opiates: NOT DETECTED
Tetrahydrocannabinol: NOT DETECTED

## 2013-10-10 LAB — GLUCOSE, CAPILLARY
GLUCOSE-CAPILLARY: 44 mg/dL — AB (ref 70–99)
GLUCOSE-CAPILLARY: 282 mg/dL — AB (ref 70–99)
GLUCOSE-CAPILLARY: 530 mg/dL — AB (ref 70–99)
Glucose-Capillary: 41 mg/dL — CL (ref 70–99)
Glucose-Capillary: 180 mg/dL — ABNORMAL HIGH (ref 70–99)

## 2013-10-10 LAB — POCT PREGNANCY, URINE: Preg Test, Ur: NEGATIVE

## 2013-10-10 LAB — ACETAMINOPHEN LEVEL: Acetaminophen (Tylenol), Serum: <15 ug/mL (ref 10–30)

## 2013-10-10 LAB — SALICYLATE LEVEL: Salicylate Lvl: <2 mg/dL — ABNORMAL LOW (ref 2.8–20.0)

## 2013-10-10 LAB — ETHANOL: Alcohol, Ethyl (B): <11 mg/dL (ref 0–11)

## 2013-10-10 MED ORDER — ZOLPIDEM TARTRATE 5 MG PO TABS
5.0000 mg | ORAL_TABLET | Freq: Every evening | ORAL | Status: DC | PRN
  Administered 2013-10-12 – 2013-10-13 (×2): 5 mg via ORAL
  Filled 2013-10-10 (×2): qty 1

## 2013-10-10 MED ORDER — HYDROCODONE-ACETAMINOPHEN 5-325 MG PO TABS
1.0000 | ORAL_TABLET | Freq: Once | ORAL | Status: AC
  Administered 2013-10-10: 1 via ORAL
  Filled 2013-10-10: qty 1

## 2013-10-10 MED ORDER — INSULIN ASPART PROT & ASPART (70-30 MIX) 100 UNIT/ML ~~LOC~~ SUSP: 12.0000 [IU] | Freq: Once | SUBCUTANEOUS | Status: DC

## 2013-10-10 MED ORDER — DEXTROSE-NACL 5-0.45 % IV SOLN
INTRAVENOUS | Status: DC
  Administered 2013-10-11: 07:00:00 via INTRAVENOUS

## 2013-10-10 MED ORDER — ACETAMINOPHEN 325 MG PO TABS
650.0000 mg | ORAL_TABLET | ORAL | Status: DC | PRN
  Administered 2013-10-13: 650 mg via ORAL
  Filled 2013-10-10: qty 2

## 2013-10-10 MED ORDER — IBUPROFEN 600 MG PO TABS
600.0000 mg | ORAL_TABLET | Freq: Three times a day (TID) | ORAL | Status: DC | PRN
  Administered 2013-10-11 – 2013-10-14 (×5): 600 mg via ORAL
  Filled 2013-10-10 (×5): qty 1

## 2013-10-10 MED ORDER — NICOTINE 21 MG/24HR TD PT24
21.0000 mg | MEDICATED_PATCH | Freq: Every day | TRANSDERMAL | Status: DC
  Filled 2013-10-10: qty 1

## 2013-10-10 MED ORDER — INSULIN ASPART 100 UNIT/ML ~~LOC~~ SOLN
12.0000 [IU] | Freq: Once | SUBCUTANEOUS | Status: AC
Start: 2013-10-10 — End: 2013-10-10
  Administered 2013-10-10: 12 [IU] via SUBCUTANEOUS
  Filled 2013-10-10: qty 1

## 2013-10-10 MED ORDER — ACETAMINOPHEN 325 MG PO TABS
650.0000 mg | ORAL_TABLET | Freq: Once | ORAL | Status: AC
  Administered 2013-10-10: 650 mg via ORAL
  Filled 2013-10-10: qty 2

## 2013-10-10 MED ORDER — DEXTROSE 50 % IV SOLN
50.0000 mL | Freq: Once | INTRAVENOUS | Status: DC
  Filled 2013-10-10: qty 50

## 2013-10-10 MED ORDER — ONDANSETRON HCL 4 MG PO TABS: 4.0000 mg | ORAL_TABLET | Freq: Three times a day (TID) | ORAL | Status: DC | PRN

## 2013-10-10 NOTE — ED Notes (Signed)
Pt CBG 41, gave OJ .
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
## 2013-10-10 NOTE — ED Notes (Signed)
Received a call from [facility] [place] assigned to a room ; 506-1  But has to be medically cleared , with CBG within 75-300mg /dl within 24 hours. To notify MD.

## 2013-10-10 NOTE — ED Provider Notes (Signed)
CSN: [id number]     Arrival date & time 10/10/13  1501 History  This chart was scribed for non-physician practitioner, Devona Konig, working with Houston Siren, MD by Celesta Gentile, ED Scribe. This patient was seen in room WTR4/WLPT4 and the patient's care was started at 4:04 PM.    Chief Complaint  Patient presents with  . Suicide Attempt  . Medical Clearance   The history is provided by the patient and the police. No language interpreter was used.   HPI Comments: [name] is a 28 y.o. female was brought by [employer] to the Emergency Department for medical clearance, because of an incident that occurred today.  [employer] reports that they were called to her residence by her boyfriend, who was concerned she was going to harm herself.  When [employer] arrived the pt stated she was fine and she was going to make an appointment with her therapist.  [employer] states at that time the boyfriend began to pack his things to leave, when the pt attempted to bust the window out and jump from the second floor.  [employer] states they struggled getting her into handcuffs and she now states she has left wrist pain.  Pt denies alcohol use and substance abuse.  She states she takes Xanax for anxiety.      Past Medical History  Diagnosis Date  . Asthma   . Tachycardia     baseline tachycardia   . Retinopathy due to secondary diabetes   . Diabetic gastroparesis   . 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 Diabetic neuropathy, type I diabetes mellitus   . Polyneuropathy in diabetes(357.2)   . Anxiety   . Hypertension   . Depression    Past Surgical History  Procedure Laterality Date  . Cholecystectomy    . Cesarean section    . Refractive surgery     Family History  Problem Relation Age of Onset  . Hypertension     History  Substance Use Topics  . Smoking status: Never Smoker   . Smokeless tobacco: Never Used  . Alcohol Use: No   OB History   Grav Para Term Preterm  Abortions TAB SAB Ect Mult Living                 Review of Systems  Musculoskeletal: Positive for arthralgias (left wrist).  Psychiatric/Behavioral:       Positive   All other systems reviewed and are negative.    Allergies  Ceftin  Home Medications   Current Outpatient Rx  Name  Route  Sig  Dispense  Refill  . albuterol (PROVENTIL HFA;VENTOLIN HFA) 108 (90 BASE) MCG/ACT inhaler   Inhalation   Inhale 2 puffs into the lungs every 6 (six) hours as needed. As needed for shortness of breath.   1 Inhaler   11   . ALPRAZolam (XANAX) 0.5 MG tablet   Oral   Take 1 tablet (0.5 mg total) by mouth 2 (two) times daily as needed for anxiety.   14 tablet   0   . budesonide (PULMICORT) 180 MCG/ACT inhaler   Inhalation   Inhale 1 puff into the lungs 2 (two) times daily.         . DULoxetine (CYMBALTA) 60 MG capsule   Oral   Take 1 capsule (60 mg total) by mouth daily.   30 capsule   3   . Insulin Human (INSULIN PUMP) 100 unit/ml SOLN   Subcutaneous  Inject 1 each into the skin continuous. lispro insulin. 31 units basal rate. Carb coverage = 1 unit/every 5 grams of carbs         . insulin lispro (HUMALOG) 100 UNIT/ML injection      3 (three) times daily before meals. Insulin pump : 31 base units 1 unit for every five grams she eats.   10 mL   12   . lisinopril (PRINIVIL,ZESTRIL) 10 MG tablet   Oral   Take 1 tablet (10 mg total) by mouth every morning.         . metoprolol tartrate (LOPRESSOR) 25 MG tablet   Oral   Take 25 mg by mouth as needed (for tachycardia lasting more than 20 minutes).         . montelukast (SINGULAIR) 10 MG tablet   Oral   Take 1 tablet (10 mg total) by mouth every morning.         . sertraline (ZOLOFT) 25 MG tablet   Oral   Take 5 tablets (125 mg total) by mouth daily.   150 tablet   0    Triage Vitals: BP 135/89  Pulse 123  Temp(Src) 98.5 F (36.9 C) (Oral)  SpO2 99%  LMP 09/19/2013 Physical Exam  Nursing note and vitals  reviewed. Constitutional: She is oriented to person, place, and time. She appears well-developed and well-nourished. No distress.  HENT:  Head: Normocephalic and atraumatic.  Eyes: EOM are normal.  Neck: Neck supple. No tracheal deviation present.  Cardiovascular: Normal rate.   Pulmonary/Chest: Effort normal. No respiratory distress.  Musculoskeletal: Normal range of motion. She exhibits tenderness (Left wrist).  Decreased ROM due to pain. Good radial pulses.  Neurological: She is alert and oriented to person, place, and time.  Skin: Skin is warm and dry.    ED Course  Procedures (including critical care time) DIAGNOSTIC STUDIES: Oxygen Saturation is 99% on RA, normal by my interpretation.    COORDINATION OF CARE: 4:09 PM-Will order x-ray of left wrist.  Screening labs pending.  Telepsych ordred.  Patient informed of current plan of treatment and evaluation and agrees with plan.    Labs Review Labs Reviewed  COMPREHENSIVE METABOLIC PANEL - Abnormal; Notable for the following:    Alkaline Phosphatase 132 (*)    Total Bilirubin <0.2 (*)    GFR calc non Af Amer 78 (*)    All other components within normal limits  SALICYLATE LEVEL - Abnormal; Notable for the following:    Salicylate Lvl <7.6 (*)    All other components within normal limits  URINE RAPID DRUG SCREEN (HOSP PERFORMED) - Abnormal; Notable for the following:    Benzodiazepines POSITIVE (*)    All other components within normal limits  GLUCOSE, CAPILLARY - Abnormal; Notable for the following:    Glucose-Capillary 41 (*)    All other components within normal limits  GLUCOSE, CAPILLARY - Abnormal; Notable for the following:    Glucose-Capillary 44 (*)    All other components within normal limits  ACETAMINOPHEN LEVEL  CBC  ETHANOL  POCT PREGNANCY, URINE   Imaging Review Dg Wrist Complete Left  10/10/2013   CLINICAL DATA:  Wrist pain.  Trauma.  EXAM: LEFT WRIST - COMPLETE 3+ VIEW  COMPARISON:  None.  FINDINGS:  Colles fracture of the distal radius noted without intra-articular extension observed.  Transverse fracture of the ulnar styloid. No additional fracture observed.  Atherosclerotic vascular calcifications noted. Small geodes observed in the lunate and triquetrum.  IMPRESSION: 1.  Colles fracture of the distal radius. Transverse fracture of the ulnar styloid.   Electronically Signed   By: Sherryl Barters M.D.   On: 10/10/2013 16:18    EKG Interpretation   None       MDM   1. Wrist fracture, left, closed, initial encounter   2. Suicide attempt   3. Depression      Patient attempted suicide and broke window at home with wrist. She sustained to fractures to her wrist. While in triage, her glucose dropped to 44. She requested some food. We are going to disable her insulin pump and monitor her glucose.  Sidney is on call for Hand ortho and he said we can put on a Velcro wrist splint, we are concerned that a plaster splint may be used to hurt herself and the needed ACE wrap may be used to hand herself.  Mother brought to my attention that before insulin pump removed patient had her pump add 20 units of insulin, pt becoming cold and shakey again. I discussed case with Dr. Tanna Furry, my attending. We will move patient to the acute side. He will start a glucostabilizer and order D50. He will assume care of the patient at this time. 5:45pm.   I personally performed the services described in this documentation, which was scribed in my presence. The recorded information has been reviewed and is accurate.    Linus Mako, PA-C 10/10/13 1744

## 2013-10-10 NOTE — ED Notes (Signed)
Bed: WLPT4 Expected date:  Expected time:  Means of arrival:  Comments: [employer]-

## 2013-10-10 NOTE — ED Notes (Addendum)
Per [employer], called to residence by boyfriend who was concerned patient was going to hurt herself, when [employer] arrived, patient stated she was ok and making appt with counselor, at that point boyfriend started packing bags to move out and pt kicked out window in what was seen to be attempt to jump, pt states it was spontaneous decision to jump out window.  When asked if she still wanted to hurt herself, pt stated no after hesitation

## 2013-10-10 NOTE — ED Notes (Addendum)
Patient belonging bag placed in locker #27
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
## 2013-10-10 NOTE — BH Assessment (Signed)
Patient accepted to [facility] by attending MD Jonnalagadda. Patient's CBG must remain between 70-350 x 24 hours before patient transfers to [facility] [place] made aware.

## 2013-10-10 NOTE — ED Provider Notes (Addendum)
I seen and evaluated this patient. Please see Verdene Rio PA-Cs dictated note below.  Patient had given herself a bolus of insulin upon arrival. She became symptomatic her sugar of 40. Was given some juice and still has her 40. An IV is placed and was given D50 1 amp. Was also given some food by mouth. Recheck blood sugar is 180, then to 80, and is now 530.  She has had a psychiatric evaluation. A bed is available apparently, now, at [place] requiring a more stable blood sugar before she can be accepted there as an inpatient. Obviously we did allow her to have her insulin pump today after her overdosage on the. She states normally she would take an evening Lantus dose of 30 units. And then sliding scale for her sugar during the day with carb counting.  With the 24-hour observation and blood sugar stability required I placed a call to the hospitalist regarding admission.  Tanna Furry, MD 10/10/13 [place]ace[place], MD 10/10/13 2238

## 2013-10-11 ENCOUNTER — Encounter (HOSPITAL_COMMUNITY): Payer: Self-pay | Admitting: *Deleted

## 2013-10-11 DIAGNOSIS — T1491XA Suicide attempt, initial encounter: Secondary | ICD-10-CM | POA: Diagnosis present

## 2013-10-11 DIAGNOSIS — S52532A Colles' fracture of left radius, initial encounter for closed fracture: Secondary | ICD-10-CM

## 2013-10-11 DIAGNOSIS — F329 Major depressive disorder, single episode, unspecified: Secondary | ICD-10-CM

## 2013-10-11 DIAGNOSIS — F411 Generalized anxiety disorder: Secondary | ICD-10-CM

## 2013-10-11 DIAGNOSIS — Z638 Other specified problems related to primary support group: Secondary | ICD-10-CM

## 2013-10-11 DIAGNOSIS — F3289 Other specified depressive episodes: Secondary | ICD-10-CM

## 2013-10-11 DIAGNOSIS — E1065 Type 1 diabetes mellitus with hyperglycemia: Secondary | ICD-10-CM

## 2013-10-11 DIAGNOSIS — IMO0002 Reserved for concepts with insufficient information to code with codable children: Secondary | ICD-10-CM

## 2013-10-11 DIAGNOSIS — F4310 Post-traumatic stress disorder, unspecified: Secondary | ICD-10-CM

## 2013-10-11 DIAGNOSIS — F339 Major depressive disorder, recurrent, unspecified: Secondary | ICD-10-CM

## 2013-10-11 DIAGNOSIS — E108 Type 1 diabetes mellitus with unspecified complications: Secondary | ICD-10-CM

## 2013-10-11 DIAGNOSIS — Z658 Other specified problems related to psychosocial circumstances: Secondary | ICD-10-CM

## 2013-10-11 HISTORY — DX: Colles' fracture of left radius, initial encounter for closed fracture: S52.532A

## 2013-10-11 LAB — CBC
HCT: 38.5 % (ref 36.0–46.0)
Hemoglobin: 13.2 g/dL (ref 12.0–15.0)
MCH: 29 pg (ref 26.0–34.0)
MCHC: 34.3 g/dL (ref 30.0–36.0)
MCV: 84.6 fL (ref 78.0–100.0)
PLATELETS: 292 10*3/uL (ref 150–400)
RBC: 4.55 MIL/uL (ref 3.87–5.11)
RDW: 12.8 % (ref 11.5–15.5)
WBC: 8.2 10*3/uL (ref 4.0–10.5)

## 2013-10-11 LAB — BASIC METABOLIC PANEL
BUN: 21 mg/dL (ref 6–23)
CO2: 28 mEq/L (ref 19–32)
Calcium: 9.4 mg/dL (ref 8.4–10.5)
Chloride: 101 mEq/L (ref 96–112)
Creatinine, Ser: 1.02 mg/dL (ref 0.50–1.10)
GFR calc Af Amer: 86 mL/min — ABNORMAL LOW (ref >90)
GFR calc non Af Amer: 74 mL/min — ABNORMAL LOW (ref >90)
GLUCOSE: 98 mg/dL (ref 70–99)
Potassium: 3.9 mEq/L (ref 3.7–5.3)
Sodium: 140 mEq/L (ref 137–147)

## 2013-10-11 LAB — GLUCOSE, CAPILLARY
GLUCOSE-CAPILLARY: 314 mg/dL — AB (ref 70–99)
GLUCOSE-CAPILLARY: 117 mg/dL — AB (ref 70–99)
GLUCOSE-CAPILLARY: 48 mg/dL — AB (ref 70–99)
Glucose-Capillary: 312 mg/dL — ABNORMAL HIGH (ref 70–99)
Glucose-Capillary: 363 mg/dL — ABNORMAL HIGH (ref 70–99)
Glucose-Capillary: 392 mg/dL — ABNORMAL HIGH (ref 70–99)
Glucose-Capillary: 409 mg/dL — ABNORMAL HIGH (ref 70–99)
Glucose-Capillary: 233 mg/dL — ABNORMAL HIGH (ref 70–99)
Glucose-Capillary: 120 mg/dL — ABNORMAL HIGH (ref 70–99)
Glucose-Capillary: 66 mg/dL — ABNORMAL LOW (ref 70–99)
Glucose-Capillary: 91 mg/dL (ref 70–99)

## 2013-10-11 LAB — ETHANOL: Alcohol, Ethyl (B): <11 mg/dL (ref 0–11)

## 2013-10-11 LAB — RAPID URINE DRUG SCREEN, HOSP PERFORMED
Amphetamines: NOT DETECTED
BARBITURATES: NOT DETECTED
Benzodiazepines: POSITIVE — AB
COCAINE: NOT DETECTED
Opiates: POSITIVE — AB
TETRAHYDROCANNABINOL: NOT DETECTED

## 2013-10-11 LAB — HEMOGLOBIN A1C
Hgb A1c MFr Bld: 10.7 % — ABNORMAL HIGH (ref <5.7)
Mean Plasma Glucose: 260 mg/dL — ABNORMAL HIGH (ref <117)

## 2013-10-11 LAB — MRSA PCR SCREENING: MRSA by PCR: NEGATIVE

## 2013-10-11 LAB — PHOSPHORUS: Phosphorus: 3 mg/dL (ref 2.3–4.6)

## 2013-10-11 LAB — MAGNESIUM: Magnesium: 1.9 mg/dL (ref 1.5–2.5)

## 2013-10-11 MED ORDER — SERTRALINE HCL 100 MG PO TABS
125.0000 mg | ORAL_TABLET | Freq: Every day | ORAL | Status: DC
  Filled 2013-10-11: qty 1

## 2013-10-11 MED ORDER — DEXTROSE 50 % IV SOLN: 25.0000 mL | INTRAVENOUS | Status: DC | PRN

## 2013-10-11 MED ORDER — SODIUM CHLORIDE 0.9 % IV SOLN
INTRAVENOUS | Status: DC
  Administered 2013-10-11: 01:00:00 via INTRAVENOUS

## 2013-10-11 MED ORDER — OXYCODONE-ACETAMINOPHEN 5-325 MG PO TABS
1.0000 | ORAL_TABLET | Freq: Four times a day (QID) | ORAL | Status: DC | PRN
  Administered 2013-10-11 – 2013-10-14 (×9): 1 via ORAL
  Filled 2013-10-11 (×9): qty 1

## 2013-10-11 MED ORDER — INSULIN REGULAR BOLUS VIA INFUSION
0.0000 [IU] | Freq: Three times a day (TID) | INTRAVENOUS | Status: DC
  Filled 2013-10-11: qty 10

## 2013-10-11 MED ORDER — MONTELUKAST SODIUM 10 MG PO TABS
10.0000 mg | ORAL_TABLET | Freq: Every morning | ORAL | Status: DC
  Administered 2013-10-11 – 2013-10-14 (×4): 10 mg via ORAL
  Filled 2013-10-11 (×4): qty 1

## 2013-10-11 MED ORDER — SENNA 8.6 MG PO TABS
2.0000 | ORAL_TABLET | Freq: Every day | ORAL | Status: DC
  Administered 2013-10-11 – 2013-10-14 (×3): 17.2 mg via ORAL
  Filled 2013-10-11 (×3): qty 2

## 2013-10-11 MED ORDER — DOCUSATE SODIUM 100 MG PO CAPS
100.0000 mg | ORAL_CAPSULE | Freq: Two times a day (BID) | ORAL | Status: DC
  Administered 2013-10-11 – 2013-10-14 (×7): 100 mg via ORAL
  Filled 2013-10-11 (×10): qty 1

## 2013-10-11 MED ORDER — SODIUM CHLORIDE 0.9 % IJ SOLN
3.0000 mL | Freq: Two times a day (BID) | INTRAMUSCULAR | Status: DC
  Administered 2013-10-11 (×2): 3 mL via INTRAVENOUS

## 2013-10-11 MED ORDER — FLUTICASONE PROPIONATE HFA 44 MCG/ACT IN AERO
2.0000 | INHALATION_SPRAY | Freq: Two times a day (BID) | RESPIRATORY_TRACT | Status: DC
  Administered 2013-10-11 – 2013-10-13 (×6): 2 via RESPIRATORY_TRACT
  Filled 2013-10-11 (×2): qty 10.6

## 2013-10-11 MED ORDER — LISINOPRIL 10 MG PO TABS
10.0000 mg | ORAL_TABLET | Freq: Every morning | ORAL | Status: DC
  Administered 2013-10-11 – 2013-10-14 (×4): 10 mg via ORAL
  Filled 2013-10-11 (×4): qty 1

## 2013-10-11 MED ORDER — METOPROLOL TARTRATE 25 MG PO TABS
25.0000 mg | ORAL_TABLET | ORAL | Status: DC | PRN
  Administered 2013-10-14: 25 mg via ORAL
  Filled 2013-10-11: qty 1

## 2013-10-11 MED ORDER — INSULIN ASPART 100 UNIT/ML ~~LOC~~ SOLN
0.0000 [IU] | Freq: Three times a day (TID) | SUBCUTANEOUS | Status: DC
  Administered 2013-10-12: 5 [IU] via SUBCUTANEOUS
  Administered 2013-10-12: 3 [IU] via SUBCUTANEOUS
  Administered 2013-10-12: 2 [IU] via SUBCUTANEOUS
  Administered 2013-10-13: 15 [IU] via SUBCUTANEOUS
  Administered 2013-10-13 – 2013-10-14 (×2): 11 [IU] via SUBCUTANEOUS
  Administered 2013-10-14: 3 [IU] via SUBCUTANEOUS

## 2013-10-11 MED ORDER — INSULIN ASPART 100 UNIT/ML ~~LOC~~ SOLN
4.0000 [IU] | Freq: Three times a day (TID) | SUBCUTANEOUS | Status: DC
  Administered 2013-10-11 – 2013-10-13 (×6): 4 [IU] via SUBCUTANEOUS

## 2013-10-11 MED ORDER — ALPRAZOLAM 0.5 MG PO TABS: 0.5000 mg | ORAL_TABLET | Freq: Two times a day (BID) | ORAL | Status: DC | PRN

## 2013-10-11 MED ORDER — INSULIN ASPART 100 UNIT/ML ~~LOC~~ SOLN: 4.0000 [IU] | Freq: Three times a day (TID) | SUBCUTANEOUS | Status: DC

## 2013-10-11 MED ORDER — INSULIN GLARGINE 100 UNIT/ML ~~LOC~~ SOLN
30.0000 [IU] | Freq: Every day | SUBCUTANEOUS | Status: DC
  Administered 2013-10-11 – 2013-10-14 (×4): 30 [IU] via SUBCUTANEOUS
  Filled 2013-10-11 (×4): qty 0.3

## 2013-10-11 MED ORDER — ALBUTEROL SULFATE HFA 108 (90 BASE) MCG/ACT IN AERS: 2.0000 | INHALATION_SPRAY | Freq: Four times a day (QID) | RESPIRATORY_TRACT | Status: DC | PRN

## 2013-10-11 MED ORDER — DEXTROSE-NACL 5-0.45 % IV SOLN
INTRAVENOUS | Status: DC
  Administered 2013-10-11: 08:00:00 via INTRAVENOUS

## 2013-10-11 MED ORDER — DULOXETINE HCL 60 MG PO CPEP
60.0000 mg | ORAL_CAPSULE | Freq: Every day | ORAL | Status: DC
  Administered 2013-10-11 – 2013-10-14 (×4): 60 mg via ORAL
  Filled 2013-10-11 (×4): qty 1

## 2013-10-11 MED ORDER — ENOXAPARIN SODIUM 40 MG/0.4ML ~~LOC~~ SOLN
40.0000 mg | SUBCUTANEOUS | Status: DC
  Administered 2013-10-11 – 2013-10-12 (×2): 40 mg via SUBCUTANEOUS
  Filled 2013-10-11 (×5): qty 0.4

## 2013-10-11 MED ORDER — INSULIN REGULAR HUMAN 100 UNIT/ML IJ SOLN
INTRAMUSCULAR | Status: DC
  Administered 2013-10-11: 14 [IU]/h via INTRAVENOUS
  Administered 2013-10-11: 10 [IU]/h via INTRAVENOUS
  Administered 2013-10-11: 6.9 [IU]/h via INTRAVENOUS
  Administered 2013-10-11: 10.2 [IU]/h via INTRAVENOUS
  Administered 2013-10-11: 6.1 [IU]/h via INTRAVENOUS
  Administered 2013-10-11: 2.5 [IU]/h via INTRAVENOUS
  Filled 2013-10-11: qty 1

## 2013-10-11 MED ORDER — INSULIN ASPART 100 UNIT/ML ~~LOC~~ SOLN: 0.0000 [IU] | Freq: Every day | SUBCUTANEOUS | Status: DC
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
## 2013-10-11 NOTE — Progress Notes (Signed)
Utilization Review completed.  

## 2013-10-11 NOTE — Progress Notes (Signed)
[facility] PROGRESS NOTE  [name] LOV:[id number] DOB: 08-14-1986 DOA: 10-29-13 PCP: Odette Fraction, MD  Assessment/Plan: Hypoglycemia -Secondary to suicide attempt -Patient bolused herself on her insulin pump -improved off of insulin pump -initially admitted to [facility] on IV insulin DM type I, uncontrolled -10/11/2013 hemoglobin A1c 10.7 -Remain off insulin pump until cleared by psychiatry -Lantus 30 units daily -Patient does not know her exact settings, but tells me that pump gives her about 31 units insulin daily -NovoLog sliding scale -Transfer to medical floor Suicide attempt -I have consult psychiatry -1-on1 sitter -continue zoloft, cymbalta, xanax Major depression/generalized anxiety disorder -as above Asthma -stable -continue inhaled corticosteroid and singulair Colles Fracture of the wrist -Spoke with Dr. Caralyn Guile (hand) -velcro wrist splint for now -follow up in office  Family Communication:   Mother and father at beside Disposition Plan:   [facility] when medically stable      Procedures/Studies: Dg Wrist Complete Left  10/29/13   CLINICAL DATA:  Wrist pain.  Trauma.  EXAM: LEFT WRIST - COMPLETE 3+ VIEW  COMPARISON:  None.  FINDINGS: Colles fracture of the distal radius noted without intra-articular extension observed.  Transverse fracture of the ulnar styloid. No additional fracture observed.  Atherosclerotic vascular calcifications noted. Small geodes observed in the lunate and triquetrum.  IMPRESSION: 1. Colles fracture of the distal radius. Transverse fracture of the ulnar styloid.   Electronically Signed   By: Sherryl Barters M.D.   On: Oct 29, 2013 16:18         Subjective: Patient denies any fevers, chills, dizziness, headache, chest pain, shortness breath, nausea, vomiting, diarrhea, bone pain, dysuria, hematuria.  Objective: Filed Vitals:   10/11/13 0710 10/11/13 0800 10/11/13 0900 10/11/13 1000  BP: 128/72 134/72 120/82 129/84   Pulse: 89 85 81 88  Temp:  98.2 F (36.8 C)    TempSrc:  Oral    Resp: 18 18 16 14   Height:  5\' 6"  (1.676 m)    Weight:  85.8 kg (189 lb 2.5 oz)    SpO2: 99% 99% 100% 99%    Intake/Output Summary (Last 24 hours) at 10/11/13 1036 Last data filed at 10/11/13 1000  Gross per 24 hour  Intake  542.8 ml  Output    350 ml  Net  192.8 ml   Weight change:  Exam:   General:  Pt is alert, follows commands appropriately, not in acute distress  HEENT: No icterus, No thrush,  [place]/AT  Cardiovascular: RRR, S1/S2, no rubs, no gallops  Respiratory: CTA bilaterally, no wheezing, no crackles, no rhonchi  Abdomen: Soft/+BS, non tender, non distended, no guarding  Extremities: No edema, No lymphangitis, No petechiae, No rashes, no synovitis  Data Reviewed: Basic Metabolic Panel:  Recent Labs Lab 29-Oct-2013 1610 10/11/13 0850  NA 140 140  K 4.5 3.9  CL 100 101  CO2 28 28  GLUCOSE 87 98  BUN 14 21  CREATININE 0.98 1.02  CALCIUM 9.4 9.4  MG  --  1.9  PHOS  --  3.0   Liver Function Tests:  Recent Labs Lab 10/29/13 1610  AST 12  ALT 10  ALKPHOS 132*  BILITOT <0.2*  PROT 7.2  ALBUMIN 3.6   No results found for this basename: LIPASE, AMYLASE,  in the last 168 hours No results found for this basename: AMMONIA,  in the last 168 hours CBC:  Recent Labs Lab 10/29/2013 1610 10/11/13 0850  WBC 8.4 8.2  HGB 14.1 13.2  HCT 41.8 38.5  MCV 85.0 84.6  PLT 314 292   Cardiac Enzymes: No results found for this basename: CKTOTAL, CKMB, CKMBINDEX, TROPONINI,  in the last 168 hours BNP: No components found with this basename: POCBNP,  CBG:  Recent Labs Lab 10/11/13 0202 10/11/13 0320 10/11/13 0421 10/11/13 0538 10/11/13 0642  GLUCAP 363* 392* 409* 314* 233*    Recent Results (from the past 240 hour(s))  MRSA PCR SCREENING     Status: None   Collection Time    10/11/13  8:17 AM      Result Value Range Status   MRSA by PCR NEGATIVE  NEGATIVE Final   Comment:             The GeneXpert MRSA Assay (FDA     approved for NASAL specimens     only), is one component of a     comprehensive MRSA colonization     surveillance program. It is not     intended to diagnose MRSA     infection nor to guide or     monitor treatment for     MRSA infections.     Scheduled Meds: . dextrose  50 mL Intravenous Once  . DULoxetine  60 mg Oral Daily  . enoxaparin (LOVENOX) injection  40 mg Subcutaneous Q24H  . fluticasone  2 puff Inhalation BID  . insulin aspart  0-15 Units Subcutaneous TID WC  . insulin aspart  0-5 Units Subcutaneous QHS  . insulin glargine  30 Units Subcutaneous Daily  . lisinopril  10 mg Oral q morning - 10a  . montelukast  10 mg Oral q morning - 10a  . nicotine  21 mg Transdermal Daily  . sertraline  125 mg Oral Daily  . sodium chloride  3 mL Intravenous Q12H   Continuous Infusions: . sodium chloride 125 mL/hr at 10/11/13 0118     Job Holtsclaw, DO  [facility] Pager [phone]  If 7PM-7AM, please contact night-coverage [email or web address] Password [email or web address] 10/11/2013, 10:35 AM   LOS: 1 day

## 2013-10-11 NOTE — Progress Notes (Signed)
Pt is not satisfied with the doctor who came to speak with her about her psychiatry issues.  Pt requests a new doctor for her psychiatry issues.  Notified [facility] doctor of patient request.  Pt very angry at doctor who came to speak with her.  Family at bedside currently.  Continue to monitor.  Mettie Roylance Roselie Awkward RN

## 2013-10-12 DIAGNOSIS — K3184 Gastroparesis: Secondary | ICD-10-CM

## 2013-10-12 LAB — GLUCOSE, CAPILLARY
GLUCOSE-CAPILLARY: 73 mg/dL (ref 70–99)
GLUCOSE-CAPILLARY: 91 mg/dL (ref 70–99)
GLUCOSE-CAPILLARY: 243 mg/dL — AB (ref 70–99)
Glucose-Capillary: 64 mg/dL — ABNORMAL LOW (ref 70–99)
Glucose-Capillary: 192 mg/dL — ABNORMAL HIGH (ref 70–99)
Glucose-Capillary: 147 mg/dL — ABNORMAL HIGH (ref 70–99)
Glucose-Capillary: 92 mg/dL (ref 70–99)

## 2013-10-12 LAB — BASIC METABOLIC PANEL
BUN: 22 mg/dL (ref 6–23)
CO2: 25 mEq/L (ref 19–32)
CREATININE: 0.92 mg/dL (ref 0.50–1.10)
Calcium: 8.5 mg/dL (ref 8.4–10.5)
Chloride: 100 mEq/L (ref 96–112)
GFR calc Af Amer: >90 mL/min (ref >90)
GFR, EST NON AFRICAN AMERICAN: 84 mL/min — AB (ref >90)
Glucose, Bld: 218 mg/dL — ABNORMAL HIGH (ref 70–99)
POTASSIUM: 4.3 meq/L (ref 3.7–5.3)
Sodium: 136 mEq/L — ABNORMAL LOW (ref 137–147)

## 2013-10-12 MED ORDER — INSULIN ASPART 100 UNIT/ML ~~LOC~~ SOLN: 4.0000 [IU] | Freq: Three times a day (TID) | SUBCUTANEOUS | Status: DC

## 2013-10-12 MED ORDER — INSULIN GLARGINE 100 UNIT/ML ~~LOC~~ SOLN: 30.0000 [IU] | Freq: Every day | SUBCUTANEOUS | Status: DC

## 2013-10-12 MED ORDER — OXYCODONE-ACETAMINOPHEN 5-325 MG PO TABS: 1.0000 | ORAL_TABLET | Freq: Four times a day (QID) | ORAL | Status: DC | PRN

## 2013-10-12 NOTE — Progress Notes (Signed)
Physician Discharge Summary  [name]:[id number] DOB: [birth year] DOA: 10/10/2013  PCP: Odette Fraction, MD  Admit date: 10/10/2013 Discharge date: 10/12/2013  Recommendations for Outpatient Follow-up:  1. Pt will need to follow up with PCP in 2 weeks post discharge 2. Please obtain BMP to evaluate electrolytes and kidney function 3. 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, uncontrolled Hypoglycemia  -Secondary to suicide attempt  -Patient bolused herself on her insulin pump 18units in ED -improved off of insulin pump  -initially admitted to [facility] on IV insulin  -Patient transitioned to subcutaneous insulin -Remain off of insulin pump until the patient is psychiatrically stable and cleared by psychiatry to do restart pump DM type I, uncontrolled  -10/11/2013 hemoglobin A1c 10.7  -Remain off insulin pump until cleared by psychiatry  -Lantus 30 units daily  -Patient does not know her exact settings, but tells me that pump gives her about 31 units insulin daily  -NovoLog sliding scale  -NovoLog 4 units before each meal in addition to the sliding scale -Transfered to medical floor  -CBGs have been well-controlled while in-patient  Suicide attempt  -appreciate psychiatry  -1-on1 sitter  -continue zoloft, cymbalta, xanax  Major depression/generalized anxiety disorder  -as above and per psychiatry -continue home meds  Asthma  -stable  -continue inhaled corticosteroid and singulair  -continue albuterol MDI prn sob Colles Fracture of the wrist  -Spoke with Dr. Caralyn Guile (hand)  -velcro wrist  splint for now  -follow up in office  Family Communication: Mother and father updated 10/11/13 Disposition Plan: [facility] when medically stable   Discharge Condition: Stable  Disposition: stable for transfer to [place]  Diet:carb modified Wt Readings from Last 3 Encounters:  10/11/13 85.8 kg (189 lb 2.5 oz)  10/07/13 87.998 kg (194 lb)  09/26/13 84.369 kg (186 lb)    History of present illness:  Patient was admitted to [facility] long medical floor from the emergency department after Her fiance called the police to her home because of threatening behaviors and possibly mistrust between them. Patient reported she kicked the window and probably trying to jump out of the window before she was restrained by [employer]. Patient reportedly mismanaged her insulin and if so drinking a lot of orange juice and she supports not to has a suicidal behavior/attention seeking behavior. She has type 1 diabetes and is on an insulin pump reportedly overdosed while she was in the emergency department, she was not able to explain her behavior during this evaluation. She and her fiance has been having relationship problems recently due to him finding out that she had been unfaithful and getting half truth. Per the patient they had been going to counseling together. However this morning the fiance was packing his bags and told her that he was leaving. She got very upset and threatened to kill herself and he called the police. While the police were there she tried to kick a second-floor window outsource to jump out of the window. The police stopped her from jumping out the window period and somehow in this altercation the patient sustained a left wrist fracture. She  came to the emergency room and was treated for the wrist fracture and was leaving when she said that her blood sugar fell low. It was checked and found to be 44. The patient was brought inside, given some sugar and food and soon her sugar was 530. The patient  admits to having given herself a bolus of insulin while she was in the emergency room being treated for her wrist fracture. She says she bolused herself 18 units because she wanted to hurt herself but she knew that was not enough to kill herself. She says that although she wanted to die earlier on the day of admission, but at the time of admission by hospitalist at present she did not want to kill herself.  The patient was admitted to the step down unit and started on intravenous insulin. She was transitioned to subcutaneous insulin once stabilized and her blood work was stable. Psychiatry was consulted. They recommended transfer to behavioral health once the patient was medically stable. The patient's CBGs remained stable on subcutaneous insulin. A 1 on 1 sitter was maintained throughout the hospitalization. She is to be maintained on subcutaneous insulin off of her insulin pump until cleared by psychiatry.   Consultants: psychiatry  Discharge Exam: Filed Vitals:   10/12/13 0603  BP: 120/75  Pulse: 88  Temp: 98.2 F (36.8 C)  Resp: 16   Filed Vitals:   10/11/13 1300 10/11/13 2056 10/11/13 2203 10/12/13 0603  BP: 134/91 117/71  120/75  Pulse: 88 90 88 88  Temp:  97.8 F (36.6 C)  98.2 F (36.8 C)  TempSrc:  Oral  Oral  Resp: 16 16 16 16   Height:      Weight:      SpO2: 100% 99%  98%   General: A&O x 3, NAD, pleasant, cooperative Cardiovascular: RRR, no rub, no gallop, no S3 Respiratory: CTAB, no wheeze, no rhonchi Abdomen:soft, nontender, nondistended, positive bowel sounds Extremities: No edema, No lymphangitis, no petechiae  Discharge Instructions      Discharge Orders   Future Appointments Provider Department Dept Phone   10/15/2013 10:00 AM Jan Fireman, [place] [phone]   11/04/2013 3:30 PM [name], MD [facility] [phone]   Future Orders Complete By Expires   Diet - low sodium heart healthy  As  directed    Increase activity slowly  As directed        Medication List    STOP taking these medications       insulin lispro 100 UNIT/ML injection  Commonly known as:  HUMALOG     insulin pump 100 unit/ml Soln      TAKE these medications       albuterol 108 (90 BASE) MCG/ACT inhaler  Commonly known as:  PROVENTIL HFA;VENTOLIN HFA  Inhale 2 puffs into the lungs every 6 (six) hours as needed. As needed for shortness of breath.     ALPRAZolam 0.5 MG tablet  Commonly known as:  XANAX  Take 1 tablet (0.5 mg total) by mouth 2 (two) times daily as needed for anxiety.     budesonide 180 MCG/ACT inhaler  Commonly known as:  PULMICORT  Inhale 1 puff into the lungs 2 (two) times daily.     DULoxetine 60 MG capsule  Commonly known as:  CYMBALTA  Take 1 capsule (60 mg total) by mouth daily.     insulin aspart 100 UNIT/ML injection  Commonly known as:  novoLOG  Inject 4 Units  into the skin 3 (three) times daily with meals.     insulin glargine 100 UNIT/ML injection  Commonly known as:  LANTUS  Inject 0.3 mLs (30 Units total) into the skin daily.     lisinopril 10 MG tablet  Commonly known as:  PRINIVIL,ZESTRIL  Take 1 tablet (10 mg total) by mouth every morning.     metoprolol tartrate 25 MG tablet  Commonly known as:  LOPRESSOR  Take 25 mg by mouth as needed (for tachycardia lasting more than 20 minutes).     montelukast 10 MG tablet  Commonly known as:  SINGULAIR  Take 1 tablet (10 mg total) by mouth every morning.     oxyCODONE-acetaminophen 5-325 MG per tablet  Commonly known as:  PERCOCET/ROXICET  Take 1 tablet by mouth every 6 (six) hours as needed for severe pain.     sertraline 25 MG tablet  Commonly known as:  ZOLOFT  Take 5 tablets (125 mg total) by mouth daily.         The results of significant diagnostics from this hospitalization (including imaging, microbiology, ancillary and laboratory) are listed below for reference.    Significant Diagnostic  Studies: Dg Wrist Complete Left  October 28, 2013   CLINICAL DATA:  Wrist pain.  Trauma.  EXAM: LEFT WRIST - COMPLETE 3+ VIEW  COMPARISON:  None.  FINDINGS: Colles fracture of the distal radius noted without intra-articular extension observed.  Transverse fracture of the ulnar styloid. No additional fracture observed.  Atherosclerotic vascular calcifications noted. Small geodes observed in the lunate and triquetrum.  IMPRESSION: 1. Colles fracture of the distal radius. Transverse fracture of the ulnar styloid.   Electronically Signed   By: Sherryl Barters M.D.   On: 2013/10/28 16:18     Microbiology: Recent Results (from the past 240 hour(s))  MRSA PCR SCREENING     Status: None   Collection Time    10/11/13  8:17 AM      Result Value Range Status   MRSA by PCR NEGATIVE  NEGATIVE Final   Comment:            The GeneXpert MRSA Assay (FDA     approved for NASAL specimens     only), is one component of a     comprehensive MRSA colonization     surveillance program. It is not     intended to diagnose MRSA     infection nor to guide or     monitor treatment for     MRSA infections.     Labs: Basic Metabolic Panel:  Recent Labs Lab 28-Oct-2013 1610 10/11/13 0850 10/12/13 0547  NA 140 140 136*  K 4.5 3.9 4.3  CL 100 101 100  CO2 28 28 25   GLUCOSE 87 98 218*  BUN 14 21 22   CREATININE 0.98 1.02 0.92  CALCIUM 9.4 9.4 8.5  MG  --  1.9  --   PHOS  --  3.0  --    Liver Function Tests:  Recent Labs Lab 10-28-13 1610  AST 12  ALT 10  ALKPHOS 132*  BILITOT <0.2*  PROT 7.2  ALBUMIN 3.6   No results found for this basename: LIPASE, AMYLASE,  in the last 168 hours No results found for this basename: AMMONIA,  in the last 168 hours CBC:  Recent Labs Lab 2013/10/28 1610 10/11/13 0850  WBC 8.4 8.2  HGB 14.1 13.2  HCT 41.8 38.5  MCV 85.0 84.6  PLT 314 292   Cardiac Enzymes: No  results found for this basename: CKTOTAL, CKMB, CKMBINDEX, TROPONINI,  in the last 168 hours BNP: No  components found with this basename: POCBNP,  CBG:  Recent Labs Lab 10/11/13 0806 10/11/13 1217 10/11/13 1612 10/11/13 2052 10/11/13 2122  GLUCAP 120* 66* 117* 48* 91    Time coordinating discharge:  Greater than 30 minutes  Signed:  Calirose Mccance, DO [facility] Pager: [phone] 10/12/2013, 7:57 AM

## 2013-10-12 NOTE — Discharge Summary (Signed)
Physician Discharge Summary  [name] YTK:[id number] DOB: November 28, 1985 DOA: 10/10/2013  PCP: Odette Fraction, MD  Admit date: 10/10/2013 Discharge date: 10/12/2013  Recommendations for Outpatient Follow-up:  1. Pt will need to follow up with PCP in 2 weeks post discharge 2. Please obtain BMP to evaluate electrolytes and kidney function 3. 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, uncontrolled Hypoglycemia  -Secondary to suicide attempt  -Patient bolused herself on her insulin pump 18units in ED -improved off of insulin pump  -initially admitted to [facility] on IV insulin  -Patient transitioned to subcutaneous insulin -Remain off of insulin pump until the patient is psychiatrically stable and cleared by psychiatry to do restart pump DM type I, uncontrolled  -10/11/2013 hemoglobin A1c 10.7  -Remain off insulin pump until cleared by psychiatry  -Lantus 30 units daily  -Patient does not know her exact settings, but tells me that pump gives her about 31 units insulin daily  -NovoLog sliding scale  -NovoLog 4 units before each meal in addition to the sliding scale -Transfered to medical floor  -CBGs have been well-controlled while in-patient  Suicide attempt  -appreciate psychiatry  -1-on1 sitter  -continue zoloft, cymbalta, xanax  Major depression/generalized anxiety disorder  -as above and per psychiatry -continue home meds  Asthma  -stable  -continue inhaled corticosteroid and singulair  -continue albuterol MDI prn sob Colles Fracture of the wrist  -Spoke with Dr. Caralyn Guile (hand)  -velcro wrist  splint for now  -follow up in office  Family Communication: Mother and father updated 10/11/13 Disposition Plan: [facility] when medically stable   Discharge Condition: Stable  Disposition: stable for transfer to [place]  Diet:carb modified Wt Readings from Last 3 Encounters:  10/11/13 85.8 kg (189 lb 2.5 oz)  10/07/13 87.998 kg (194 lb)  09/26/13 84.369 kg (186 lb)    History of present illness:  Patient was admitted to [facility] long medical floor from the emergency department after Her fiance called the police to her home because of threatening behaviors and possibly mistrust between them. Patient reported she kicked the window and probably trying to jump out of the window before she was restrained by [employer]. Patient reportedly mismanaged her insulin and if so drinking a lot of orange juice and she supports not to has a suicidal behavior/attention seeking behavior. She has type 1 diabetes and is on an insulin pump reportedly overdosed while she was in the emergency department, she was not able to explain her behavior during this evaluation. She and her fiance has been having relationship problems recently due to him finding out that she had been unfaithful and getting half truth. Per the patient they had been going to counseling together. However this morning the fiance was packing his bags and told her that he was leaving. She got very upset and threatened to kill herself and he called the police. While the police were there she tried to kick a second-floor window outsource to jump out of the window. The police stopped her from jumping out the window period and somehow in this altercation the patient sustained a left wrist fracture. She  came to the emergency room and was treated for the wrist fracture and was leaving when she said that her blood sugar fell low. It was checked and found to be 44. The patient was brought inside, given some sugar and food and soon her sugar was 530. The patient  admits to having given herself a bolus of insulin while she was in the emergency room being treated for her wrist fracture. She says she bolused herself 18 units because she wanted to hurt herself but she knew that was not enough to kill herself. She says that although she wanted to die earlier on the day of admission, but at the time of admission by hospitalist at present she did not want to kill herself.  The patient was admitted to the step down unit and started on intravenous insulin. She was transitioned to subcutaneous insulin once stabilized and her blood work was stable. Psychiatry was consulted. They recommended transfer to behavioral health once the patient was medically stable. The patient's CBGs remained stable on subcutaneous insulin. A 1 on 1 sitter was maintained throughout the hospitalization. She is to be maintained on subcutaneous insulin off of her insulin pump until cleared by psychiatry.   Consultants: psychiatry  Discharge Exam: Filed Vitals:   10/12/13 0603  BP: 120/75  Pulse: 88  Temp: 98.2 F (36.8 C)  Resp: 16   Filed Vitals:   10/11/13 1300 10/11/13 2056 10/11/13 2203 10/12/13 0603  BP: 134/91 117/71  120/75  Pulse: 88 90 88 88  Temp:  97.8 F (36.6 C)  98.2 F (36.8 C)  TempSrc:  Oral  Oral  Resp: 16 16 16 16   Height:      Weight:      SpO2: 100% 99%  98%   General: A&O x 3, NAD, pleasant, cooperative Cardiovascular: RRR, no rub, no gallop, no S3 Respiratory: CTAB, no wheeze, no rhonchi Abdomen:soft, nontender, nondistended, positive bowel sounds Extremities: No edema, No lymphangitis, no petechiae  Discharge Instructions      Discharge Orders   Future Appointments Provider Department Dept Phone   10/15/2013 10:00 AM Jan Fireman, [place] [phone]   11/04/2013 3:30 PM [name], MD [facility] [phone]   Future Orders Complete By Expires   Diet - low sodium heart healthy  As  directed    Increase activity slowly  As directed        Medication List    STOP taking these medications       insulin lispro 100 UNIT/ML injection  Commonly known as:  HUMALOG     insulin pump 100 unit/ml Soln      TAKE these medications       albuterol 108 (90 BASE) MCG/ACT inhaler  Commonly known as:  PROVENTIL HFA;VENTOLIN HFA  Inhale 2 puffs into the lungs every 6 (six) hours as needed. As needed for shortness of breath.     ALPRAZolam 0.5 MG tablet  Commonly known as:  XANAX  Take 1 tablet (0.5 mg total) by mouth 2 (two) times daily as needed for anxiety.     budesonide 180 MCG/ACT inhaler  Commonly known as:  PULMICORT  Inhale 1 puff into the lungs 2 (two) times daily.     DULoxetine 60 MG capsule  Commonly known as:  CYMBALTA  Take 1 capsule (60 mg total) by mouth daily.     insulin aspart 100 UNIT/ML injection  Commonly known as:  novoLOG  Inject 4 Units  into the skin 3 (three) times daily with meals.     insulin glargine 100 UNIT/ML injection  Commonly known as:  LANTUS  Inject 0.3 mLs (30 Units total) into the skin daily.     lisinopril 10 MG tablet  Commonly known as:  PRINIVIL,ZESTRIL  Take 1 tablet (10 mg total) by mouth every morning.     metoprolol tartrate 25 MG tablet  Commonly known as:  LOPRESSOR  Take 25 mg by mouth as needed (for tachycardia lasting more than 20 minutes).     montelukast 10 MG tablet  Commonly known as:  SINGULAIR  Take 1 tablet (10 mg total) by mouth every morning.     oxyCODONE-acetaminophen 5-325 MG per tablet  Commonly known as:  PERCOCET/ROXICET  Take 1 tablet by mouth every 6 (six) hours as needed for severe pain.     sertraline 25 MG tablet  Commonly known as:  ZOLOFT  Take 5 tablets (125 mg total) by mouth daily.         The results of significant diagnostics from this hospitalization (including imaging, microbiology, ancillary and laboratory) are listed below for reference.    Significant Diagnostic  Studies: Dg Wrist Complete Left  October 20, 2013   CLINICAL DATA:  Wrist pain.  Trauma.  EXAM: LEFT WRIST - COMPLETE 3+ VIEW  COMPARISON:  None.  FINDINGS: Colles fracture of the distal radius noted without intra-articular extension observed.  Transverse fracture of the ulnar styloid. No additional fracture observed.  Atherosclerotic vascular calcifications noted. Small geodes observed in the lunate and triquetrum.  IMPRESSION: 1. Colles fracture of the distal radius. Transverse fracture of the ulnar styloid.   Electronically Signed   By: Sherryl Barters M.D.   On: 2013/10/20 16:18     Microbiology: Recent Results (from the past 240 hour(s))  MRSA PCR SCREENING     Status: None   Collection Time    10/11/13  8:17 AM      Result Value Range Status   MRSA by PCR NEGATIVE  NEGATIVE Final   Comment:            The GeneXpert MRSA Assay (FDA     approved for NASAL specimens     only), is one component of a     comprehensive MRSA colonization     surveillance program. It is not     intended to diagnose MRSA     infection nor to guide or     monitor treatment for     MRSA infections.     Labs: Basic Metabolic Panel:  Recent Labs Lab 20-Oct-2013 1610 10/11/13 0850 10/12/13 0547  NA 140 140 136*  K 4.5 3.9 4.3  CL 100 101 100  CO2 28 28 25   GLUCOSE 87 98 218*  BUN 14 21 22   CREATININE 0.98 1.02 0.92  CALCIUM 9.4 9.4 8.5  MG  --  1.9  --   PHOS  --  3.0  --    Liver Function Tests:  Recent Labs Lab 10-20-13 1610  AST 12  ALT 10  ALKPHOS 132*  BILITOT <0.2*  PROT 7.2  ALBUMIN 3.6   No results found for this basename: LIPASE, AMYLASE,  in the last 168 hours No results found for this basename: AMMONIA,  in the last 168 hours CBC:  Recent Labs Lab 20-Oct-2013 1610 10/11/13 0850  WBC 8.4 8.2  HGB 14.1 13.2  HCT 41.8 38.5  MCV 85.0 84.6  PLT 314 292   Cardiac Enzymes: No  results found for this basename: CKTOTAL, CKMB, CKMBINDEX, TROPONINI,  in the last 168 hours BNP: No  components found with this basename: POCBNP,  CBG:  Recent Labs Lab 10/11/13 0806 10/11/13 1217 10/11/13 1612 10/11/13 2052 10/11/13 2122  GLUCAP 120* 66* 117* 48* 91    Time coordinating discharge:  Greater than 30 minutes  Signed:  Memori Sammon, DO [facility] Pager: [phone] 10/12/2013, 8:00 AM

## 2013-10-12 NOTE — Progress Notes (Signed)
Clinical Social Work Department BRIEF PSYCHOSOCIAL ASSESSMENT 10/12/2013  Patient:  [name], [name]     Account Number:  [id number]     Admit date:  10/10/2013  Clinical Social Worker:  Levie Heritage  Date/Time:  10/12/2013 04:25 PM  Referred by:  Physician  Date Referred:  10/12/2013 Referred for  Behavioral Health Issues   Other Referral:   Interview type:  Patient Other interview type:    PSYCHOSOCIAL DATA Living Status:  FAMILY Admitted from facility:   Level of care:   Primary support name:  Judeth Porch Primary support relationship to patient:  PARENT Degree of support available:   strong    CURRENT CONCERNS Current Concerns  Post-Acute Placement   Other Concerns:    SOCIAL WORK ASSESSMENT / PLAN Met with Pt to discuss d/c plans.    Pt understands that Dr. Lenna Sciara rounds on the floor to which she would be assigned if she were to go to [facility].  Because of this, Pt agreeable to CSW making a referral on her behalf to [facility].    Pt hopeful that a different psych MD will round on her tomorrow and that he/she will let her go home, as she has several up-coming out-patient appts and she will be going to her parents', where they will closely monitor her.    Pt emphasized that her attempt was impulsive and that, while she did kick the window in, she did not go through with jumping.    CSW thanked Pt for her time.    Referral made to [facility].   Assessment/plan status:  Psychosocial Support/Ongoing Assessment of Needs Other assessment/ plan:   Information/referral to community resources:    PATIENT'S/FAMILY'S RESPONSE TO PLAN OF CARE: Pt not happy about the possibility of not being able to go home upon d/c, although she understands the rationale.    Pt calm, cooperative and pleasant.    Pt thanked CSW for time and assistance.   Bernita Raisin, [place] Work [phone]

## 2013-10-12 NOTE — Progress Notes (Signed)
Hypoglycemic Event  CBG:48  Treatment: crackers/ soda  Symptoms: asymptomatic  Follow-up CBG: Time:2120 CBG Result:91  Possible Reasons for Event: 4 units given at 1900 w/ glucose reading 117  Comments/MD notified: na    Nat Math  Remember to initiate Hypoglycemia Order Set & complete

## 2013-10-12 NOTE — Progress Notes (Signed)
T/c from [place], [place] at [facility].  Per Otila Kluver, Pt has requested not to be seen by Dr. Lenna Sciara.  Unfortunately, the unit to which Pt would be assigned at [facility] is Dr. Joanie Coddington.  Pt would not be appropriate for the other units (psych, substance abuse).  Otila Kluver suggests that Pt be referred to other facilities.  Bernita Raisin, [place] Work [phone]

## 2013-10-12 NOTE — Progress Notes (Signed)
Per RN, Pt ready for d/c and psych MD recommends [facility].  Referral made to [facility].  Per [facility], not likely that there will be beds today; Otila Kluver anticipating beds available tomorrow.  Bernita Raisin, [place] Work [phone]

## 2013-10-13 DIAGNOSIS — S5290XD Unspecified fracture of unspecified forearm, subsequent encounter for closed fracture with routine healing: Secondary | ICD-10-CM

## 2013-10-13 DIAGNOSIS — E1010 Type 1 diabetes mellitus with ketoacidosis without coma: Secondary | ICD-10-CM

## 2013-10-13 DIAGNOSIS — F332 Major depressive disorder, recurrent severe without psychotic features: Secondary | ICD-10-CM

## 2013-10-13 LAB — GLUCOSE, CAPILLARY
GLUCOSE-CAPILLARY: 99 mg/dL (ref 70–99)
GLUCOSE-CAPILLARY: 164 mg/dL — AB (ref 70–99)
Glucose-Capillary: 339 mg/dL — ABNORMAL HIGH (ref 70–99)
Glucose-Capillary: 380 mg/dL — ABNORMAL HIGH (ref 70–99)

## 2013-10-13 MED ORDER — ARIPIPRAZOLE 2 MG PO TABS
2.0000 mg | ORAL_TABLET | Freq: Every day | ORAL | Status: DC
  Administered 2013-10-13: 2 mg via ORAL
  Filled 2013-10-13 (×2): qty 1

## 2013-10-13 MED ORDER — INSULIN ASPART 100 UNIT/ML ~~LOC~~ SOLN
8.0000 [IU] | Freq: Three times a day (TID) | SUBCUTANEOUS | Status: DC
  Administered 2013-10-13 – 2013-10-14 (×3): 8 [IU] via SUBCUTANEOUS

## 2013-10-13 NOTE — Progress Notes (Signed)
Clinical Social Work  CSW continues to search for placement:  [facility]- reports no available beds  [facility]- reports no available beds for self-pay patient at this time. [facility] would need to approve stay at hospital and admissions reports a waiting list for [facility] approved beds at this time.  [facility]- reports available beds. Referral faxed.  [facility]- left a message with admissions inquiring about availability  [facility]- left a message with admissions inquiring about availability  CSW will continue to follow.  [place], [place] [phone]

## 2013-10-13 NOTE — Progress Notes (Signed)
[facility] PROGRESS NOTE  [name] KXF:[id number] DOB: 1986/06/17 DOA: 10/10/2013 PCP: Cristina Fraction, MD  Interim history 28 year old female with history of type I DM on insulin pump, poorly controlled was admitted on 10/10/13 with suicidal attempt-was trying to jump out of second floor window at her home and bolused herself with 18 units of insulin via her insulin pump in the ED. Psychiatry has assessed her to have recurrent major depression, posttraumatic stress disorder and recommend acute psychiatric hospitalization when medically cleared.  Assessment/Plan:  Hypoglycemia -Secondary to suicide attempt -Patient bolused herself on her insulin pump in the ED - Resolved and now hyperglycemic.  DM type I, uncontrolled -10/11/2013 hemoglobin A1c 10.7 -Remains off insulin pump until cleared by psychiatry -Lantus 30 units daily -Patient does not know her exact settings, but tells that pump gives her about 31 units insulin daily -NovoLog sliding scale -Patient was managed with insulin drip in the step down unit and then transitioned to Lantus, NovoLog mealtime and SSI. - Discussed with her primary endocrinologist [name] who recommended continuing current dose of Lantus, increased NovoLog mealtime to 8 units 3 times a day and continuing SSI. Monitor closely.  Suicide attempt - Psychiatry input appreciated. Patient denies any further suicidal ideations -Editor, commissioning -continue zoloft, cymbalta, xanax  Major depression/generalized anxiety disorder - as above - Psychiatry recommends acute psychiatric hospitalization when medically stable. Requested psychiatry to reevaluate 10/13/2012  Asthma -stable -continue inhaled corticosteroid and singulair  Colles Fracture of the wrist - Dr. Carles West had spoken with [name] (hand) who had recommended velcro wrist splint for now & OP follow up in office - Parents were concerned that patient may go to a psychiatric facility for  undetermined duration of time and may not get to see the orthopedic M.D. Discussed with [name] on 1/5 who advised that was okay to continue the Velcro wrist splint and followup with him as outpatient in one week or when discharged from psychiatric facility. Patient denies pain.  CODE STATUS: Full Family Communication:   Mother and father at beside, after patient's consent Disposition Plan:   Inpatient psychiatric facility when bed available.      Procedures/Studies: Dg Wrist Complete Left  Subjective: Patient denies complaints. Denies suicidal ideations. Denies pain in left wrist.  Objective: Filed Vitals:   10/12/13 2040 10/12/13 2139 10/13/13 0538 10/13/13 1344  BP:  116/78 121/82 113/74  Pulse: 93 82 83 113  Temp:  98.3 F (36.8 C) 97.6 F (36.4 C) 98.6 F (37 C)  TempSrc:  Oral Oral Oral  Resp: 18 18 16 18   Height:      Weight:      SpO2: 99% 98% 97% 98%    Intake/Output Summary (Last 24 hours) at 10/13/13 1346 Last data filed at 10/13/13 0900  Gross per 24 hour  Intake    360 ml  Output      0 ml  Net    360 ml   Weight change:  Exam:   General:  Pt is alert, follows commands appropriately, not in acute distress  Cardiovascular: RRR, S1/S2, no rubs, no gallops  Respiratory: CTA bilaterally, no wheezing, no crackles, no rhonchi  Abdomen: Soft/+BS, non tender, non distended, no guarding  Extremities: No edema, No lymphangitis, No petechiae, No rashes, no synovitis. Left wrist in splint. Peripheral pulses symmetrically felt.  Psychiatry: Pleasant and cooperative. Appropriate affect.  Data Reviewed: Basic Metabolic Panel:  Recent Labs Lab 10/10/13 1610 10/11/13 0850 10/12/13 0547  NA 140  140 136*  K 4.5 3.9 4.3  CL 100 101 100  CO2 28 28 25   GLUCOSE 87 98 218*  BUN 14 21 22   CREATININE 0.98 1.02 0.92  CALCIUM 9.4 9.4 8.5  MG  --  1.9  --   PHOS  --  3.0  --    Liver Function Tests:  Recent Labs Lab 10/10/13 1610  AST 12  ALT 10   ALKPHOS 132*  BILITOT <0.2*  PROT 7.2  ALBUMIN 3.6   No results found for this basename: LIPASE, AMYLASE,  in the last 168 hours No results found for this basename: AMMONIA,  in the last 168 hours CBC:  Recent Labs Lab 10/10/13 1610 10/11/13 0850  WBC 8.4 8.2  HGB 14.1 13.2  HCT 41.8 38.5  MCV 85.0 84.6  PLT 314 292   Cardiac Enzymes: No results found for this basename: CKTOTAL, CKMB, CKMBINDEX, TROPONINI,  in the last 168 hours BNP: No components found with this basename: POCBNP,  CBG:  Recent Labs Lab 10/12/13 1122 10/12/13 1626 10/12/13 2032 10/13/13 0710 10/13/13 1114  GLUCAP 243* 147* 92 339* 380*    Recent Results (from the past 240 hour(s))  MRSA PCR SCREENING     Status: None   Collection Time    10/11/13  8:17 AM      Result Value Range Status   MRSA by PCR NEGATIVE  NEGATIVE Final   Comment:            The GeneXpert MRSA Assay (FDA     approved for NASAL specimens     only), is one component of a     comprehensive MRSA colonization     surveillance program. It is not     intended to diagnose MRSA     infection nor to guide or     monitor treatment for     MRSA infections.     Scheduled Meds: . dextrose  50 mL Intravenous Once  . docusate sodium  100 mg Oral BID  . DULoxetine  60 mg Oral Daily  . enoxaparin (LOVENOX) injection  40 mg Subcutaneous Q24H  . fluticasone  2 puff Inhalation BID  . insulin aspart  0-15 Units Subcutaneous TID WC  . insulin aspart  0-5 Units Subcutaneous QHS  . insulin aspart  8 Units Subcutaneous TID WC  . insulin glargine  30 Units Subcutaneous Daily  . lisinopril  10 mg Oral q morning - 10a  . montelukast  10 mg Oral q morning - 10a  . senna  2 tablet Oral Daily  . sodium chloride  3 mL Intravenous Q12H   Continuous Infusions:     Cristina Lehnen, MD, FACP, FHM. [facility] Pager [phone]  If 7PM-7AM, please contact night-coverage [email or web address] Password [email or web address] 10/13/2013, 1:58 PM  Time: 45  minutes spent reviewing chart, labs, evaluating patient, counseling patient and family, discussing with consultants.   LOS: 3 days
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
## 2013-10-13 NOTE — Progress Notes (Addendum)
Inpatient Diabetes Program Recommendations  AACE/ADA: New Consensus Statement on Inpatient Glycemic Control (2013)  Target Ranges:  Prepandial:   less than 140 mg/dL      Peak postprandial:   less than 180 mg/dL (1-2 hours)      Critically ill patients:  140 - 180 mg/dL   Reason for Visit:  Consult for Diabetes  Familiar with pt from previous admission at [name] on 09/26/2013. Blood sugars elevated and will need more meal coverage insulin. Pt uses insulin pump at home. Will copy note from previous admission with basal and bolus rates.  Basal: 1.25 units/hr  Bolus: 1/5 insulin CHO ratio  CF: 40 (1 unit for every 40mg /dL >100)   To be transferred to [facility] this afternoon. Pt asked Probation officer to contact appropriate employee for grievance with MD.   Hanley Seamen info to Unit Director.   Results for [name], [name] (MRN [id number]) as of 10/13/2013 15:28  Ref. Range 10/12/2013 05:47  Sodium Latest Range: 137-147 mEq/L 136 (L)  Potassium Latest Range: 3.7-5.3 mEq/L 4.3  Chloride Latest Range: 96-112 mEq/L 100  CO2 Latest Range: 19-32 mEq/L 25  BUN Latest Range: 6-23 mg/dL 22  Creatinine Latest Range: 0.50-1.10 mg/dL 0.92  Calcium Latest Range: 8.4-10.5 mg/dL 8.5  GFR calc non Af Amer Latest Range: >90 mL/min 84 (L)  GFR calc Af Amer Latest Range: >90 mL/min >90  Glucose Latest Range: 70-99 mg/dL 218 (H)  Results for [name], [name] (MRN [id number]) as of 10/13/2013 15:28  Ref. Range 10/11/2013 21:22 10/12/2013 07:32 10/12/2013 11:22 10/12/2013 16:26 10/12/2013 20:32 10/13/2013 07:10 10/13/2013 11:14  Glucose-Capillary Latest Range: 70-99 mg/dL 91 192 (H) 243 (H) 147 (H) 92 339 (H) 380 (H)  Results for [name], [name] (MRN [id number]) as of 10/13/2013 15:28  Ref. Range 10/11/2013 01:10  Hemoglobin A1C Latest Range: <5.7 % 10.7 (H)   Recommendations: Lantus 30 units Q24 hours. Novolog 8 units tidwc for meal coverage insulin. Novolog moderate tidwc and hs.    Note: Will continue to follow while inpatient. Thank  you. Lorenda Peck, RD, LDN, CDE Inpatient Diabetes Coordinator [phone]
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
## 2013-10-14 ENCOUNTER — Inpatient Hospital Stay (HOSPITAL_COMMUNITY)
Admission: AD | Admit: 2013-10-14 | Discharge: 2013-10-17 | DRG: 885 | Disposition: A | Discharge status: Home / Self Care | Payer: 59 | Source: Intra-hospital | Attending: Psychiatry | Admitting: Psychiatry

## 2013-10-14 ENCOUNTER — Encounter (HOSPITAL_COMMUNITY): Payer: Self-pay | Admitting: *Deleted

## 2013-10-14 DIAGNOSIS — R45851 Suicidal ideations: Secondary | ICD-10-CM | POA: Diagnosis not present

## 2013-10-14 DIAGNOSIS — J45909 Unspecified asthma, uncomplicated: Secondary | ICD-10-CM | POA: Diagnosis present

## 2013-10-14 DIAGNOSIS — F329 Major depressive disorder, single episode, unspecified: Secondary | ICD-10-CM

## 2013-10-14 DIAGNOSIS — E1049 Type 1 diabetes mellitus with other diabetic neurological complication: Secondary | ICD-10-CM | POA: Diagnosis present

## 2013-10-14 DIAGNOSIS — R000 Tachycardia, unspecified: Secondary | ICD-10-CM | POA: Diagnosis present

## 2013-10-14 DIAGNOSIS — T1491XA Suicide attempt, initial encounter: Secondary | ICD-10-CM

## 2013-10-14 DIAGNOSIS — R3919 Other difficulties with micturition: Secondary | ICD-10-CM

## 2013-10-14 DIAGNOSIS — F32A Depression, unspecified: Secondary | ICD-10-CM

## 2013-10-14 DIAGNOSIS — F411 Generalized anxiety disorder: Secondary | ICD-10-CM

## 2013-10-14 DIAGNOSIS — Z9289 Personal history of other medical treatment: Secondary | ICD-10-CM

## 2013-10-14 DIAGNOSIS — R110 Nausea: Secondary | ICD-10-CM

## 2013-10-14 DIAGNOSIS — K561 Intussusception: Secondary | ICD-10-CM

## 2013-10-14 DIAGNOSIS — J90 Pleural effusion, not elsewhere classified: Secondary | ICD-10-CM

## 2013-10-14 DIAGNOSIS — Z8639 Personal history of other endocrine, nutritional and metabolic disease: Secondary | ICD-10-CM

## 2013-10-14 DIAGNOSIS — K8020 Calculus of gallbladder without cholecystitis without obstruction: Secondary | ICD-10-CM

## 2013-10-14 DIAGNOSIS — Z87448 Personal history of other diseases of urinary system: Secondary | ICD-10-CM

## 2013-10-14 DIAGNOSIS — E1142 Type 2 diabetes mellitus with diabetic polyneuropathy: Secondary | ICD-10-CM | POA: Diagnosis present

## 2013-10-14 DIAGNOSIS — F332 Major depressive disorder, recurrent severe without psychotic features: Principal | ICD-10-CM

## 2013-10-14 DIAGNOSIS — I10 Essential (primary) hypertension: Secondary | ICD-10-CM | POA: Diagnosis present

## 2013-10-14 DIAGNOSIS — K3184 Gastroparesis: Secondary | ICD-10-CM | POA: Diagnosis present

## 2013-10-14 DIAGNOSIS — Z862 Personal history of diseases of the blood and blood-forming organs and certain disorders involving the immune mechanism: Secondary | ICD-10-CM

## 2013-10-14 DIAGNOSIS — Z79899 Other long term (current) drug therapy: Secondary | ICD-10-CM

## 2013-10-14 DIAGNOSIS — F339 Major depressive disorder, recurrent, unspecified: Secondary | ICD-10-CM | POA: Diagnosis present

## 2013-10-14 DIAGNOSIS — E10319 Type 1 diabetes mellitus with unspecified diabetic retinopathy without macular edema: Secondary | ICD-10-CM

## 2013-10-14 DIAGNOSIS — IMO0002 Reserved for concepts with insufficient information to code with codable children: Secondary | ICD-10-CM

## 2013-10-14 DIAGNOSIS — E1065 Type 1 diabetes mellitus with hyperglycemia: Secondary | ICD-10-CM

## 2013-10-14 DIAGNOSIS — R1012 Left upper quadrant pain: Secondary | ICD-10-CM

## 2013-10-14 DIAGNOSIS — E108 Type 1 diabetes mellitus with unspecified complications: Secondary | ICD-10-CM

## 2013-10-14 DIAGNOSIS — I498 Other specified cardiac arrhythmias: Secondary | ICD-10-CM

## 2013-10-14 DIAGNOSIS — Z8719 Personal history of other diseases of the digestive system: Secondary | ICD-10-CM

## 2013-10-14 DIAGNOSIS — R739 Hyperglycemia, unspecified: Secondary | ICD-10-CM

## 2013-10-14 DIAGNOSIS — E1010 Type 1 diabetes mellitus with ketoacidosis without coma: Secondary | ICD-10-CM

## 2013-10-14 LAB — GLUCOSE, CAPILLARY
GLUCOSE-CAPILLARY: 122 mg/dL — AB (ref 70–99)
GLUCOSE-CAPILLARY: 100 mg/dL — AB (ref 70–99)
Glucose-Capillary: 304 mg/dL — ABNORMAL HIGH (ref 70–99)
Glucose-Capillary: 162 mg/dL — ABNORMAL HIGH (ref 70–99)
Glucose-Capillary: 69 mg/dL — ABNORMAL LOW (ref 70–99)

## 2013-10-14 MED ORDER — IBUPROFEN 600 MG PO TABS: 600.0000 mg | ORAL_TABLET | Freq: Three times a day (TID) | ORAL | Status: DC | PRN

## 2013-10-14 MED ORDER — INSULIN ASPART 100 UNIT/ML ~~LOC~~ SOLN: 0.0000 [IU] | Freq: Every day | SUBCUTANEOUS | Status: DC

## 2013-10-14 MED ORDER — INSULIN GLARGINE 100 UNIT/ML ~~LOC~~ SOLN: 30.0000 [IU] | Freq: Every day | SUBCUTANEOUS | Status: DC

## 2013-10-14 MED ORDER — DULOXETINE HCL 60 MG PO CPEP
60.0000 mg | ORAL_CAPSULE | Freq: Every day | ORAL | Status: DC
  Administered 2013-10-15 – 2013-10-17 (×3): 60 mg via ORAL
  Filled 2013-10-14 (×6): qty 1

## 2013-10-14 MED ORDER — METOPROLOL TARTRATE 50 MG PO TABS: 25.0000 mg | ORAL_TABLET | ORAL | Status: DC | PRN

## 2013-10-14 MED ORDER — ALUM & MAG HYDROXIDE-SIMETH 200-200-20 MG/5ML PO SUSP: 30.0000 mL | ORAL | Status: DC | PRN

## 2013-10-14 MED ORDER — ARIPIPRAZOLE 2 MG PO TABS
2.0000 mg | ORAL_TABLET | Freq: Every day | ORAL | Status: DC
  Administered 2013-10-14 – 2013-10-16 (×3): 2 mg via ORAL
  Filled 2013-10-14 (×7): qty 1

## 2013-10-14 MED ORDER — FLUTICASONE PROPIONATE HFA 44 MCG/ACT IN AERO
2.0000 | INHALATION_SPRAY | Freq: Two times a day (BID) | RESPIRATORY_TRACT | Status: DC
  Administered 2013-10-14 – 2013-10-17 (×6): 2 via RESPIRATORY_TRACT
  Filled 2013-10-14 (×2): qty 10.6

## 2013-10-14 MED ORDER — ACETAMINOPHEN 325 MG PO TABS: 650.0000 mg | ORAL_TABLET | Freq: Four times a day (QID) | ORAL | Status: DC | PRN

## 2013-10-14 MED ORDER — ALBUTEROL SULFATE HFA 108 (90 BASE) MCG/ACT IN AERS: 2.0000 | INHALATION_SPRAY | Freq: Four times a day (QID) | RESPIRATORY_TRACT | Status: DC | PRN

## 2013-10-14 MED ORDER — INSULIN GLARGINE 100 UNIT/ML ~~LOC~~ SOLN
30.0000 [IU] | Freq: Every day | SUBCUTANEOUS | Status: DC
  Administered 2013-10-15 – 2013-10-16 (×2): 30 [IU] via SUBCUTANEOUS

## 2013-10-14 MED ORDER — INSULIN ASPART 100 UNIT/ML ~~LOC~~ SOLN: 8.0000 [IU] | Freq: Three times a day (TID) | SUBCUTANEOUS | Status: DC

## 2013-10-14 MED ORDER — INSULIN ASPART 100 UNIT/ML ~~LOC~~ SOLN: 0.0000 [IU] | Freq: Three times a day (TID) | SUBCUTANEOUS | Status: DC

## 2013-10-14 MED ORDER — MONTELUKAST SODIUM 10 MG PO TABS
10.0000 mg | ORAL_TABLET | Freq: Every morning | ORAL | Status: DC
  Administered 2013-10-15 – 2013-10-17 (×3): 10 mg via ORAL
  Filled 2013-10-14 (×5): qty 1

## 2013-10-14 MED ORDER — MAGNESIUM HYDROXIDE 400 MG/5ML PO SUSP: 30.0000 mL | Freq: Every day | ORAL | Status: DC | PRN

## 2013-10-14 MED ORDER — TRAZODONE HCL 50 MG PO TABS
50.0000 mg | ORAL_TABLET | Freq: Every evening | ORAL | Status: DC | PRN
  Administered 2013-10-14 – 2013-10-16 (×3): 50 mg via ORAL
  Filled 2013-10-14 (×11): qty 1

## 2013-10-14 MED ORDER — ARIPIPRAZOLE 2 MG PO TABS: 2.0000 mg | ORAL_TABLET | Freq: Every day | ORAL | Status: DC

## 2013-10-14 MED ORDER — INSULIN ASPART 100 UNIT/ML ~~LOC~~ SOLN
0.0000 [IU] | Freq: Three times a day (TID) | SUBCUTANEOUS | Status: DC
  Administered 2013-10-14: 1 [IU] via SUBCUTANEOUS
  Administered 2013-10-15: 3 [IU] via SUBCUTANEOUS
  Administered 2013-10-15: 2 [IU] via SUBCUTANEOUS
  Administered 2013-10-15: 1 [IU] via SUBCUTANEOUS
  Administered 2013-10-16: 2 [IU] via SUBCUTANEOUS
  Administered 2013-10-16: 3 [IU] via SUBCUTANEOUS
  Administered 2013-10-16: 2 [IU] via SUBCUTANEOUS
  Administered 2013-10-17: 7 [IU] via SUBCUTANEOUS
  Administered 2013-10-17: 5 [IU] via SUBCUTANEOUS

## 2013-10-14 MED ORDER — LISINOPRIL 10 MG PO TABS
10.0000 mg | ORAL_TABLET | Freq: Every morning | ORAL | Status: DC
  Administered 2013-10-15 – 2013-10-17 (×3): 10 mg via ORAL
  Filled 2013-10-14: qty 2
  Filled 2013-10-14 (×6): qty 1

## 2013-10-14 MED ORDER — INSULIN ASPART 100 UNIT/ML ~~LOC~~ SOLN
8.0000 [IU] | Freq: Three times a day (TID) | SUBCUTANEOUS | Status: DC
  Administered 2013-10-14 – 2013-10-17 (×9): 8 [IU] via SUBCUTANEOUS

## 2013-10-14 MED ORDER — OXYCODONE-ACETAMINOPHEN 5-325 MG PO TABS
1.0000 | ORAL_TABLET | Freq: Four times a day (QID) | ORAL | Status: DC | PRN
  Administered 2013-10-14 – 2013-10-15 (×3): 1 via ORAL
  Filled 2013-10-14 (×3): qty 1

## 2013-10-14 MED ORDER — ALPRAZOLAM 0.5 MG PO TABS: 0.5000 mg | ORAL_TABLET | Freq: Two times a day (BID) | ORAL | Status: DC | PRN
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
## 2013-10-14 NOTE — [facility] Group Notes (Signed)
Adult Psychoeducational Group Note  Date:  10/14/2013 Time:  9:43 PM  Group Topic/Focus:  Wrap-Up Group:   The focus of this group is to help patients review their daily goal of treatment and discuss progress on daily workbooks.  Participation Level:  Active  Participation Quality:  Appropriate  Affect:  Appropriate  Cognitive:  Appropriate  Insight: Appropriate  Engagement in Group:  Engaged  Modes of Intervention:  Discussion  Additional Comments:  Patient stated that she met her goals. But her main goal was to go home. Patient actively listened to her peers and offered support when needed.  Russ Halo 10/14/2013, 9:43 PM

## 2013-10-14 NOTE — Progress Notes (Signed)
Clinical Social Work  Therapist, sports has called report and [facility] ready to accept patient. CSW coordinated transportation via [employer]. Patient aware of transfer and will alert parents. CSW is signing off but available if needed.  [place], [place] [phone]

## 2013-10-14 NOTE — Progress Notes (Signed)
Clinical Social Work  CSW met with patient and gave update regarding DC plans. Patient is agreeable to inpatient hospitalization and prefers [facility] at this time.  [facility]- reports unsure of DC but will know around 11-11:30am. CSW will follow up.  [facility]- asked CSW to call back around [place]- has denied patient due to medical needs  [facility]- has no available beds  CSW will continue to follow. Patient aware and agreeable to all plans. Patient reports she will keep family updated and reports CSW does not need to call parents.  [place], [place] [phone]

## 2013-10-14 NOTE — Progress Notes (Signed)
Came to visit patient on behalf of [employer] program for [employer] employees/dependents with [employer]. States she is familiar with Link to Wellness program for DM management. Left packet at bedside along with contact information. Will follow up post hospital discharge. Noted patient to go to [facility] today.  Marthenia Rolling, MSN, RN, BSN- [facility] [phone]

## 2013-10-14 NOTE — Progress Notes (Signed)
Report called to Seth Bake at [facility]. Pt very calm, cooperative and appropriate behavior today.

## 2013-10-14 NOTE — Discharge Summary (Addendum)
Physician Discharge Summary  [name] FWY:[id number] DOB: 09-17-1986 DOA: 10/10/2013  PCP: Cristina Fraction, MD  Admit date: 10/10/2013 Discharge date: 10/14/2013  Recommendations for Outpatient Follow-up:  1. Dr. Jenna Luo, PCP 2. Dr. Delrae Rend, endocrinologist in one week 3. Dr. Iran Planas, orthopedics in one week 4. To the psychiatrist at [facility]: Patient was on Zoloft prior to this hospitalization which was not continued in the hospital-please address as needed. When patient is discharged from [facility], she needs to discharge on her insulin pump. She will also need prescriptions for Abilify. 5. Continue Velcro left wrist splint until outpatient followup with orthopedics.  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   DM type I, uncontrolled  -10/11/2013 hemoglobin A1c 10.7  -Remains off insulin pump in the hospital. As per patient's prior admission to behavioral health Center, insulin pumps are apparently not done they either. Hence patient will continue on Lantus and NovoLog until she is discharged from behavioral health Center at which point she can resume her insulin pump. She has been repeatedly warned regarding dangerous consequences of overdosing on any medications including insulin. She verbalizes understanding and states that she will never do this again. -Lantus 30 units daily  -Patient does not know her exact settings, but tells that pump gives her about 31  units insulin daily  -Patient was managed with insulin drip in the step down unit and then transitioned to Lantus, NovoLog mealtime and SSI.  - Discussed with her primary endocrinologist Dr. Delrae Rend on 10/13/13 who recommended continuing current dose of Lantus, increased NovoLog mealtime to 8 units 3 times a day and continuing SSI. Monitor closely. CBGs are fluctuating but better controlled than yesterday.   Suicide attempt  - Psychiatry input appreciated. Patient denies any further suicidal ideations  -Editor, commissioning  -continue Cymbalta, xanax  - Psychiatry reevaluated on 1/5 and added Abilify. They also recommended inpatient psychiatric hospitalization for further management  Major depression/generalized anxiety disorder  - as above  - Psychiatry recommends acute psychiatric hospitalization when medically stable.   Asthma  -stable  -continue inhaled corticosteroid and singulair   Colles Fracture of the left wrist  - Dr. Carles Collet had spoken with Dr. Caralyn Guile (hand) who had recommended velcro wrist splint for now & OP follow up in office  - Parents were concerned that patient may go to a psychiatric facility for undetermined duration of time and may not get to see the orthopedic M.D. Discussed with Dr. Caralyn Guile on 1/5 who advised that was okay to continue the Velcro wrist splint and followup with him as outpatient in one week or when discharged from psychiatric facility. Patient has mild intermittent pain. Can use ibuprofen when necessary.   Discharge Condition: Stable  Disposition:  Follow-up Information   Follow up with Cristina Hoff, MD. Schedule an appointment as soon as possible for a visit in 1 week.   Specialty:  Orthopedic Surgery   Contact information:   [address] [place] [address] [phone]  Schedule an appointment as soon as possible for a visit with Cristina Fraction, MD.   Specialty:  Family Medicine   Contact information:   [address]  [address] [place] [address] [phone]       Follow up with KERR,JEFFREY, MD. Schedule an appointment as soon as possible for a visit in 1 week.   Specialty:  Endocrinology   Contact information:   [address] [place] [address] [phone]     stable for transfer to [facility]  Diet:carb modified Wt Readings from Last 3 Encounters:  10/11/13 85.8 kg (189 lb 2.5 oz)  10/07/13 87.998 kg (194 lb)  09/26/13 84.369 kg (186 lb)    History of present illness:  28 year old female with history of type I DM on insulin pump, poorly controlled was admitted on 10/10/13 with suicidal attempt-was trying to jump out of second floor window at her home and bolused herself with 18 units of insulin via her insulin pump in the ED. Psychiatry has assessed her to have recurrent major depression, posttraumatic stress disorder and recommend acute psychiatric hospitalization when medically cleared.   Consultants: Psychiatry  Patient denies complaints. Denies suicidal ideations. Intermittent pain left wrist  Discharge Exam:   Filed Vitals:   10/14/13 1353  BP: 96/54  Pulse: 78  Temp: 98.7 F (37.1 C)  Resp: 18   Filed Vitals:   10/13/13 2133 10/14/13 0625 10/14/13 1039 10/14/13 1353  BP: 106/72 110/71 116/78 96/54  Pulse: 86 76 121 78  Temp: 97.8 F (36.6 C) 97.6 F (36.4 C) 98.1 F (36.7 C) 98.7 F (37.1 C)  TempSrc: Oral Oral Axillary Oral  Resp: 18 18  18   Height:      Weight:      SpO2: 98% 98% 100% 98%   General: Pt is alert, follows commands appropriately, not in acute distress Cardiovascular: RRR, S1/S2, no rubs, no gallops Respiratory: CTA bilaterally, no wheezing, no crackles, no rhonchi Abdomen: Soft/+BS, non tender, non distended, no guarding Extremities: No edema, No lymphangitis, No petechiae, No rashes, no synovitis. Left wrist in splint. Peripheral pulses symmetrically felt. Psychiatry: Pleasant and cooperative. Appropriate  affect.   Discharge Instructions      Discharge Orders   Future Appointments Provider Department Dept Phone   10/15/2013 10:00 AM [name], [place] [phone]   11/04/2013 3:30 PM Cristina Frizzle, MD [place] [phone]   Future Orders Complete By Expires   Activity as tolerated - No restrictions  As directed    Call MD for:  As directed    Comments:     Suicidal thoughts   Diet Carb Modified  As directed        Medication List    STOP taking these medications       insulin lispro 100 UNIT/ML injection  Commonly known as:  HUMALOG     insulin pump 100 unit/ml Soln     sertraline 25 MG tablet  Commonly known as:  ZOLOFT      TAKE these medications       albuterol 108 (90 BASE) MCG/ACT inhaler  Commonly known as:  PROVENTIL HFA;VENTOLIN HFA  Inhale 2 puffs into the lungs every 6 (six) hours as needed. As needed for shortness of breath.     ALPRAZolam 0.5 MG tablet  Commonly known as:  XANAX  Take 1 tablet (0.5 mg total) by mouth 2 (two) times daily as needed for anxiety.  ARIPiprazole 2 MG tablet  Commonly known as:  ABILIFY  Take 1 tablet (2 mg total) by mouth at bedtime.     budesonide 180 MCG/ACT inhaler  Commonly known as:  PULMICORT  Inhale 1 puff into the lungs 2 (two) times daily.     DULoxetine 60 MG capsule  Commonly known as:  CYMBALTA  Take 1 capsule (60 mg total) by mouth daily.     ibuprofen 600 MG tablet  Commonly known as:  ADVIL,MOTRIN  Take 1 tablet (600 mg total) by mouth every 8 (eight) hours as needed for mild pain or moderate pain.     insulin aspart 100 UNIT/ML injection  Commonly known as:  novoLOG  - Inject 0-15 Units into the skin 3 (three) times daily with meals. CBG < 70: implement hypoglycemia protocol  - CBG 70 - 120: 0 units  - CBG 121 - 150: 2 units  - CBG 151 - 200: 3 units  - CBG 201 - 250: 5 units  - CBG 251 - 300: 8 units  - CBG 301 - 350:  11 units  - CBG 351 - 400: 15 units  - CBG > 400: call MD     insulin aspart 100 UNIT/ML injection  Commonly known as:  novoLOG  - Inject 0-5 Units into the skin at bedtime. CBG < 70: implement hypoglycemia protocol  - CBG 70 - 120: 0 units  - CBG 121 - 150: 0 units  - CBG 151 - 200: 0 units  - CBG 201 - 250: 2 units  - CBG 251 - 300: 3 units  - CBG 301 - 350: 4 units  - CBG 351 - 400: 5 units  - CBG > 400: call MD     insulin aspart 100 UNIT/ML injection  Commonly known as:  novoLOG  Inject 8 Units into the skin 3 (three) times daily with meals.     insulin glargine 100 UNIT/ML injection  Commonly known as:  LANTUS  Inject 0.3 mLs (30 Units total) into the skin daily.     lisinopril 10 MG tablet  Commonly known as:  PRINIVIL,ZESTRIL  Take 1 tablet (10 mg total) by mouth every morning.     metoprolol tartrate 25 MG tablet  Commonly known as:  LOPRESSOR  Take 25 mg by mouth as needed (for tachycardia lasting more than 20 minutes).     montelukast 10 MG tablet  Commonly known as:  SINGULAIR  Take 1 tablet (10 mg total) by mouth every morning.         The results of significant diagnostics from this hospitalization (including imaging, microbiology, ancillary and laboratory) are listed below for reference.    Significant Diagnostic Studies: Dg Wrist Complete Left  October 16, 2013   CLINICAL DATA:  Wrist pain.  Trauma.  EXAM: LEFT WRIST - COMPLETE 3+ VIEW  COMPARISON:  None.  FINDINGS: Colles fracture of the distal radius noted without intra-articular extension observed.  Transverse fracture of the ulnar styloid. No additional fracture observed.  Atherosclerotic vascular calcifications noted. Small geodes observed in the lunate and triquetrum.  IMPRESSION: 1. Colles fracture of the distal radius. Transverse fracture of the ulnar styloid.   Electronically Signed   By: Sherryl Barters M.D.   On: 10-16-13 16:18     Microbiology: Recent Results (from the past 240  hour(s))  MRSA PCR SCREENING     Status: None   Collection Time    10/11/13  8:17 AM      Result  Value Range Status   MRSA by PCR NEGATIVE  NEGATIVE Final   Comment:            The GeneXpert MRSA Assay (FDA     approved for NASAL specimens     only), is one component of a     comprehensive MRSA colonization     surveillance program. It is not     intended to diagnose MRSA     infection nor to guide or     monitor treatment for     MRSA infections.     Labs: Basic Metabolic Panel:  Recent Labs Lab 10/10/13 1610 10/11/13 0850 10/12/13 0547  NA 140 140 136*  K 4.5 3.9 4.3  CL 100 101 100  CO2 28 28 25   GLUCOSE 87 98 218*  BUN 14 21 22   CREATININE 0.98 1.02 0.92  CALCIUM 9.4 9.4 8.5  MG  --  1.9  --   PHOS  --  3.0  --    Liver Function Tests:  Recent Labs Lab 10/10/13 1610  AST 12  ALT 10  ALKPHOS 132*  BILITOT <0.2*  PROT 7.2  ALBUMIN 3.6   No results found for this basename: LIPASE, AMYLASE,  in the last 168 hours No results found for this basename: AMMONIA,  in the last 168 hours CBC:  Recent Labs Lab 10/10/13 1610 10/11/13 0850  WBC 8.4 8.2  HGB 14.1 13.2  HCT 41.8 38.5  MCV 85.0 84.6  PLT 314 292   Cardiac Enzymes: No results found for this basename: CKTOTAL, CKMB, CKMBINDEX, TROPONINI,  in the last 168 hours BNP: No components found with this basename: POCBNP,  CBG:  Recent Labs Lab 10/13/13 1114 10/13/13 1634 10/13/13 2039 10/14/13 0802 10/14/13 1134  GLUCAP 380* 99 164* 304* 162*    Time coordinating discharge:  Greater than 30 minutes  Signed:  Vernell Leep, MD, FACP, FHM. [facility] Pager [phone]  If 7PM-7AM, please contact night-coverage [email or web address] Password [email or web address] 10/14/2013, 2:11 PM  10/14/2013, 2:11 PM

## 2013-10-14 NOTE — Tx Team (Signed)
Initial Interdisciplinary Treatment Plan  PATIENT STRENGTHS: (choose at least two) Ability for insight Active sense of humor Average or above average intelligence Capable of independent living Communication skills General fund of knowledge Motivation for treatment/growth Religious Affiliation Supportive family/friends  PATIENT STRESSORS: Financial difficulties Health problems   PROBLEM LIST: Problem List/Patient Goals Date to be addressed Date deferred Reason deferred Estimated date of resolution  depression      Med. mgmt                                                 DISCHARGE CRITERIA:  Ability to meet basic life and health needs Adequate post-discharge living arrangements Improved stabilization in mood, thinking, and/or behavior Motivation to continue treatment in a less acute level of care Reduction of life-threatening or endangering symptoms to within safe limits Verbal commitment to aftercare and medication compliance  PRELIMINARY DISCHARGE PLAN: Participate in family therapy Return to previous living arrangement Return to previous work or school arrangements  PATIENT/FAMIILY INVOLVEMENT: This treatment plan has been presented to and reviewed with the patient, [name], and/or family member,  The patient and family have been given the opportunity to ask questions and make suggestions.  Marijo File 10/14/2013, 4:47 PM

## 2013-10-14 NOTE — Progress Notes (Addendum)
Clinical Social Work  Patient accepted to [facility] 501-2. RN to call report to [phone]. [facility] requests that patient arrive around 2pm. Patient signed voluntary form which was faxed to [facility] and original copy placed on chart. CSW will continue to follow and will assist with transportation once DC orders placed.  [place], [place] [phone]

## 2013-10-14 NOTE — Progress Notes (Signed)
Patient was interviewed and examined today. She denies suicidal ideations. Intermittent left wrist pain. Patient had mild asymptomatic tachycardia and received metoprolol. She states that she has been evaluated by her PCP and cardiology for same. As per Education officer, museum, bed available at behavioral health [facility]. Examination findings as per updated DC summary. DC summary and discharge medication reconciliation was reviewed and updated. Patient will discharge to behavioral health Center this afternoon.  Vernell Leep, MD, FACP, FHM. [facility] Pager [phone]  If 7PM-7AM, please contact night-coverage [email or web address] Password [email or web address] 10/14/2013, 2:13 PM

## 2013-10-15 ENCOUNTER — Ambulatory Visit (HOSPITAL_COMMUNITY): Payer: Self-pay | Admitting: Psychology

## 2013-10-15 ENCOUNTER — Encounter (HOSPITAL_COMMUNITY): Payer: Self-pay | Admitting: Family

## 2013-10-15 DIAGNOSIS — F332 Major depressive disorder, recurrent severe without psychotic features: Principal | ICD-10-CM

## 2013-10-15 DIAGNOSIS — F4310 Post-traumatic stress disorder, unspecified: Secondary | ICD-10-CM

## 2013-10-15 LAB — GLUCOSE, CAPILLARY
GLUCOSE-CAPILLARY: 124 mg/dL — AB (ref 70–99)
GLUCOSE-CAPILLARY: 193 mg/dL — AB (ref 70–99)
Glucose-Capillary: 238 mg/dL — ABNORMAL HIGH (ref 70–99)
Glucose-Capillary: 261 mg/dL — ABNORMAL HIGH (ref 70–99)

## 2013-10-15 MED ORDER — CLONAZEPAM 0.5 MG PO TABS
0.5000 mg | ORAL_TABLET | Freq: Two times a day (BID) | ORAL | Status: DC
  Filled 2013-10-15: qty 1

## 2013-10-15 MED ORDER — IBUPROFEN 600 MG PO TABS
600.0000 mg | ORAL_TABLET | Freq: Four times a day (QID) | ORAL | Status: DC | PRN
  Administered 2013-10-15 – 2013-10-17 (×3): 600 mg via ORAL
  Filled 2013-10-15 (×3): qty 1

## 2013-10-15 NOTE — Progress Notes (Signed)
D: Patient appropriate and cooperative with staff. Patient's affect/mood is flat and depressed. She reported on the self inventory sheet that she's sleeping well, appetite and ability to pay attention are both good, and energy level is normal. Patient rated depression and feelings of hopelessness "0". She's visible in the milieu and attending scheduled groups. Patient compliant with medication regimen.  A: Support and encouragement provided to patient. Scheduled medications administered per MD orders. Maintain Q15 minute checks for safety.  R: Patient receptive. Denies SI/HI/AVH. Patient remains safe on the unit.

## 2013-10-15 NOTE — Progress Notes (Signed)
Recreation Therapy Notes  Date: 01.07.2014 Time: 2:45pm Location: 500 Hall Dayroom   Group Topic: Communication, Team Building, Problem Solving  Goal Area(s) Addresses:  Patient will effectively work with peer towards shared goal.  Patient will identify skill used to make activity successful.  Patient will identify how skills used during activity can be used to reach post d/c goals.   Behavioral Response: Appropriate   Intervention: Problem Solving Activitiy  Activity: Life Boat. Patients were given a scenario about being on a sinking yacht. Patients were informed the yacht included 5 guest, 8 of which could be placed on the life boat, along with all group members. Individuals on guest list were of varying socioeconomic classes such as a Information systems manager, Associate Professor, Recruitment consultant, Barrister's clerk.   Education: Education officer, community, Discharge Planning   Education Outcome: Acknowledges Education  Clinical Observations/Feedback: Patient actively engaged in group session, voicing his opinion and debating with peers appropriately. Patient identified qualities that guided his decision making, in addition to importance of skills used during group session. Patient related using group skills effectively to being "more well rounded" and being able to handle life's bumps more efficiently.   Patient additionally highlighted importance of marrying emotion filled qualities (decision making qualities) with concrete concepts (social skills) to build effective support system post d/c.   Laureen Ochs Shalev Helminiak, LRT/CTRS  Deyra Perdomo L 10/15/2013 5:18 PM
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
## 2013-10-15 NOTE — Progress Notes (Signed)
D: Patient resting in bed with eyes closed.  Respirations even and unlabored.  Patient appears to be in no apparent distress. A: Staff to monitor Q 15 mins for safety.   R:Patient remains safe on the unit.  

## 2013-10-15 NOTE — Progress Notes (Signed)
[facility] Group Notes:  (Nursing/MHT/Case Management/Adjunct)  Date:  10/15/2013  Time:  9:57 PM  Type of Therapy:  Group Therapy  Participation Level:  Active  Participation Quality:  Appropriate  Affect:  Appropriate  Cognitive:  Appropriate  Insight:  Appropriate  Engagement in Group:  Engaged  Modes of Intervention:  Discussion  Summary of Progress/Problems:The Pt expressed that family came to visit so that made her day good.The Pt expressed that she really misses her daughter.  Nash Shearer 10/15/2013, 9:57 PM

## 2013-10-15 NOTE — Tx Team (Addendum)
Interdisciplinary Treatment Plan Update   Date Reviewed:  10/15/2013  Time Reviewed:  9:46 AM  Progress in Treatment:   Attending groups: Yes Participating in groups: Yes Taking medication as prescribed: Yes  Tolerating medication: Yes Family/Significant other contact made: No, but will ask patient for consent for collateral contact Patient understands diagnosis: Yes  Discussing patient identified problems/goals with staff: Yes Medical problems stabilized or resolved: Yes Denies suicidal/homicidal ideation: Yes Patient has not harmed self or others: Yes  For review of initial/current patient goals, please see plan of care.  Estimated Length of Stay:  3-4 days  Reasons for Continued Hospitalization:  Anxiety Depression Medication stabilization   New Problems/Goals identified:    Discharge Plan or Barriers:   Home with outpatient follow up [facility]  Additional Comments:   N/A  Attendees:  Patient:  10/15/2013 9:46 AM   Signature: Mylinda Latina, MD 10/15/2013 9:46 AM  Signature:  Catalina Pizza, FNP  10/15/2013 9:46 AM  Signature: [name], RN 10/15/2013 9:46 AM  Signature: 10/15/2013 9:46 AM  Signature:  10/15/2013 9:46 AM  Signature:  Joette Catching, LCSW 10/15/2013 9:46 AM  Signature:  Regan Lemming, LCSW 10/15/2013 9:46 AM  Signature:  Lucinda Dell, Care Coordinator 10/15/2013 9:46 AM  Signature:  Marshall Cork, RN 10/15/2013 9:46 AM  Signature: Marilynne Halsted, RN 10/15/2013  9:46 AM  Signature:   10/15/2013  9:46 AM  Signature:   10/15/2013  9:46 AM    Scribe for Treatment Team:   Joette Catching,  10/15/2013 9:46 AM

## 2013-10-15 NOTE — Progress Notes (Signed)
Patient ID: [name], female   DOB: June 15, 1986, 28 y.o.   MRN: [id number] D: pt. Visible on unit in dayroom and hall. Pt. Has on left arm brace, abe to move fingers freely, capillary refill lest than 2 seconds, comparable to right hand minimum swelling noted, no c/o pain at this time. Pt. Denies SHI, speaks of SI and says"I had time to think about everything and it was stupid." "I had time to pray"  A: Writer introduced to client and provided emotional support by listening and discussing incident that brought her to the hospital. Pt. Encouraged to attend group.  Staff will monitor q97min for safety. R: pt. Is safe on the unit. And attended group.

## 2013-10-15 NOTE — BHH Group Notes (Signed)
[facility] LCSW Aftercare Discharge Planning Group Note   10/15/2013 11:58 AM    Participation Quality:  Appropraite  Mood/Affect:  Appropriate  Depression Rating:  0  Anxiety Rating:  0  Thoughts of Suicide:  No  Will you contract for safety?   NA  Current AVH:  No  Plan for Discharge/Comments:  Patient attended discharge planning group and actively participated in group.  She reports having follow up with [facility].  CSW provided all participants with daily workbook.   Transportation Means: Patient has transportation.   Supports:  Patient has a support system.   Haruka Kowaleski, Eulas Post
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
## 2013-10-15 NOTE — [facility] Group Notes (Signed)
[place] LCSW Group Therapy  Emotional Regulation 1:15 - 2: 30 PM        10/15/2013   Type of Therapy:  Group Therapy  Participation Level:  Appropriate  Participation Quality:  Appropriate  Affect:  Appropriate  Cognitive:  Attentive Appropriate  Insight:  Developing/Improving Engaged  Engagement in Therapy:  Developing/Improving Engaged  Modes of Intervention:  Discussion Exploration Problem-Solving Supportive  Summary of Progress/Problems:  Group topic was emotional regulations.  Patient participated in the discussion and was able to identify an emotion that needed to regulated.  She shared she has to learn to control sadness from disappointment.  Patient was able to identify approprite coping skills.  Concha Pyo 10/15/2013

## 2013-10-16 LAB — GLUCOSE, CAPILLARY
GLUCOSE-CAPILLARY: 233 mg/dL — AB (ref 70–99)
Glucose-Capillary: 199 mg/dL — ABNORMAL HIGH (ref 70–99)
Glucose-Capillary: 200 mg/dL — ABNORMAL HIGH (ref 70–99)

## 2013-10-16 MED ORDER — CLONAZEPAM 0.5 MG PO TABS
0.5000 mg | ORAL_TABLET | Freq: Two times a day (BID) | ORAL | Status: DC | PRN
  Administered 2013-10-16: 0.5 mg via ORAL
  Filled 2013-10-16: qty 1

## 2013-10-16 NOTE — Progress Notes (Signed)
D: Patient's affect and mood is appropriate to circumstance. She reported on the self inventory sheet that she's sleeping well, appetite and ability to pay attention are both good, and energy level is high. Complained of LT wrist pain. Patient rated depression and feelings of hopelessness "0". Continue to participate in groups and compliant with medications.  A: Support and encouragement provided to patient. Administered scheduled medications per ordering MD. PRN Ibuprofen given for left wrist pain. Monitor Q15 minute checks for safety.  R: Patient receptive. Denies SI/HI. Patient remains safe on the unit.

## 2013-10-16 NOTE — [facility] Suicide Risk Assessment (Signed)
[name] INPATIENT:  Family/Significant Other Suicide Prevention Education  Suicide Prevention Education:  Education Completed; [name], Mother, [phone]; has been identified by the patient as the family member/significant other with whom the patient will be residing, and identified as the person(s) who will aid the patient in the event of a mental health crisis (suicidal ideations/suicide attempt).  With written consent from the patient, the family member/significant other has been provided the following suicide prevention education, prior to the and/or following the discharge of the patient.  The suicide prevention education provided includes the following:  Suicide risk factors  Suicide prevention and interventions  National Suicide Hotline telephone number  [facility] assessment telephone number  [facility] Emergency Assistance [place] and/or Residential Mobile Crisis Unit telephone number  Request made of family/significant other to:  Remove weapons (e.g., guns, rifles, knives), all items previously/currently identified as safety concern.  Mother advised guns have been secured.  Remove drugs/medications (over-the-counter, prescriptions, illicit drugs), all items previously/currently identified as a safety concern.  The family member/significant other verbalizes understanding of the suicide prevention education information provided.  The family member/significant other agrees to remove the items of safety concern listed above.  Concha Pyo 10/16/2013, 8:31 AM

## 2013-10-16 NOTE — Progress Notes (Signed)
D: Pt denies SI/HI/AVH. Pt is pleasant and cooperative. Pt stated she got anxious after talking to her parents, but was better later in the evening.   A: Pt was offered support and encouragement. Pt was given scheduled medications. Pt was encourage to attend groups. Q 15 minute checks were done for safety.   R: interacts well with peers and staff. Pt is taking medication. Pt has no complaints.Pt receptive to treatment and safety maintained on unit.

## 2013-10-16 NOTE — Progress Notes (Signed)
Adult Psychoeducational Group Note  Date:  10/16/2013 Time:  08:00pm Group Topic/Focus:  Wrap-Up Group:   The focus of this group is to help patients review their daily goal of treatment and discuss progress on daily workbooks.  Participation Level:  Active  Participation Quality:  Appropriate  Affect:  Appropriate  Cognitive:  Alert and Appropriate  Insight: Appropriate  Engagement in Group:  Engaged  Modes of Intervention:  Discussion and Education  Additional Comments:   Pt attended and participated in group.    [name] D 10/16/2013, 8:54 PM

## 2013-10-16 NOTE — [facility] Group Notes (Signed)
[place] LCSW Group Therapy  Living A Balanced Life  1:15 - 2: 30          10/16/2013  3:49 PM    Type of Therapy:  Group Therapy  Participation Level:  Appropriate  Participation Quality:  Appropriate  Affect:  Appropriate  Cognitive:  Attentive Appropriate  Insight:  Engaged  Engagement in Therapy:  Engaged  Modes of Intervention:  Discussion Exploration Problem-Solving Supportive.   Summary of Progress/Problems: Patient listened attentively to speaker from [place].  She asked a lot of questions about [facility] and the speaker's story.  She shared she is very interested in following up for services.   [name] 10/16/2013  3:49 PM

## 2013-10-16 NOTE — Progress Notes (Signed)
Adult Psychoeducational Group Note  Date:  10/16/2013 Time:  9:00 AM  Group Topic/Focus:  Morning Wellness Group  Participation Level:  Active  Participation Quality:  Appropriate and Attentive  Affect:  Appropriate  Cognitive:  Alert and Oriented  Insight: Appropriate  Engagement in Group:  Engaged  Modes of Intervention:  Discussion    Kathlen Brunswick 10/16/2013, 6:39 PM

## 2013-10-16 NOTE — Progress Notes (Signed)
Adult Psychoeducational Group Note  Date:  10/16/2013 Time:  11:19 AM  Group Topic/Focus:  Building Self Esteem:   The Focus of this group is helping patients become aware of the effects of self-esteem on their lives, the things they and others do that enhance or undermine their self-esteem, seeing the relationship between their level of self-esteem and the choices they make and learning ways to enhance self-esteem.  Participation Level:  Minimal  Participation Quality:  Appropriate  Affect:  Appropriate  Cognitive:  Appropriate  Insight: Appropriate  Engagement in Group:  Engaged  Modes of Intervention:  Discussion, Education and Support  Additional Comments:  Pt attended group and participated.   Eydie Wormley P 10/16/2013, 11:19 AM

## 2013-10-16 NOTE — Progress Notes (Signed)
Recreation Therapy Notes  Animal-Assisted Activity/Therapy (AAA/T) Program Checklist/Progress Notes Patient Eligibility Criteria Checklist & Daily Group note for Rec Tx Intervention  Date: 01.08.2014 Time: 2:45pm Location: [address]   AAA/T Program Assumption of Risk Form signed by Patient/ or Parent Legal Guardian yes  Patient is free of allergies or severe asthma yes  Patient reports no fear of animals yes  Patient reports no history of cruelty to animals yes   Patient understands his/her participation is voluntary yes  Patient washes hands before animal contact yes  Patient washes hands after animal contact yes  Behavioral Response: Engaged, Appropriate   Education: Hand Washing, Appropriate Animal Interaction   Education Outcome: Acknowledges understanding   Clinical Observations/Feedback: Patient interacted appropriately with therapy dog team.   Lane Hacker, LRT/CTRS  Loneta Tamplin L 10/16/2013 4:53 PM

## 2013-10-16 NOTE — Progress Notes (Signed)
[facility] MD Progress Note  10/16/2013 11:13 AM [name]  MRN:  [id number] Subjective:  Patient was seen and chart reviewed. Patient was admitted from the [facility] long medical floor for agitated and aggressive behavior when she learned her fiance is trying to leave the home which required [employer] intervention in the meanwhile she had a fracture of her left arm. Patient tried to kill herself by injecting 14 units of insulin while in the emergency department which resulted hypoglycemia. Patient was admitted to the psychiatric unit second time within a month for increased symptoms of depression and anxiety with suicide attempts. Patient blames her primary care physician switching medications Zoloft to Cymbalta and blame herself for acting out impulsively and also says being a dumb and continued to regret for her behavior. Patient has been compliant with her medication without adverse effects and has not reported problems on the unit. Diagnosis:   DSM5: Schizophrenia Disorders:   Obsessive-Compulsive Disorders:   Trauma-Stressor Disorders:   Substance/Addictive Disorders:   Depressive Disorders:    Axis I: Major Depression, Recurrent severe and Post Traumatic Stress Disorder  ADL's:  Intact  Sleep: Fair  Appetite:  Fair  Suicidal Ideation:  Patient had a suicide attempt and aggressive behavior and contracts for safety while in hospital Homicidal Ideation:  Denied AEB (as evidenced by):  Psychiatric Specialty Exam: ROS  Blood pressure 108/76, pulse 89, temperature 97.4 F (36.3 C), temperature source Oral, resp. rate 18, height 5' 6.54" (1.69 m), weight 85.73 kg (189 lb), last menstrual period 09/19/2013.Body mass index is 30.02 kg/(m^2).  General Appearance: Guarded  Eye Contact::  Fair  Speech:  Clear and Coherent  Volume:  Decreased  Mood:  Anxious, Depressed, Hopeless, Irritable and Worthless  Affect:  Depressed and Flat  Thought Process:  Goal Directed and Intact  Orientation:   Full (Time, Place, and Person)  Thought Content:  Rumination  Suicidal Thoughts:  Yes.  with intent/plan  Homicidal Thoughts:  No  Memory:  Immediate;   Fair  Judgement:  Impaired  Insight:  Lacking  Psychomotor Activity:  Psychomotor Retardation and Restlessness  Concentration:  Fair  Recall:  Fair  Akathisia:  NA  Handed:  Right  AIMS (if indicated):     Assets:  Communication Skills Desire for Improvement Financial Resources/Insurance Housing Physical Health Resilience Social Support Transportation  Sleep:  Number of Hours: 6.75   Current Medications: Current Facility-Administered Medications  Medication Dose Route Frequency Provider Last Rate Last Dose  . acetaminophen (TYLENOL) tablet 650 mg  650 mg Oral Q6H PRN Benjamine Mola, FNP      . albuterol (PROVENTIL HFA;VENTOLIN HFA) 108 (90 BASE) MCG/ACT inhaler 2 puff  2 puff Inhalation Q6H PRN Benjamine Mola, FNP      . alum & mag hydroxide-simeth (MAALOX/MYLANTA) 200-200-20 MG/5ML suspension 30 mL  30 mL Oral Q4H PRN Benjamine Mola, FNP      . ARIPiprazole (ABILIFY) tablet 2 mg  2 mg Oral QHS Laverle Hobby, PA-C   2 mg at 10/15/13 2118  . clonazePAM (KLONOPIN) tablet 0.5 mg  0.5 mg Oral BID PRN Durward Parcel, MD      . DULoxetine (CYMBALTA) DR capsule 60 mg  60 mg Oral Daily Benjamine Mola, FNP   60 mg at 10/16/13 0803  . fluticasone (FLOVENT HFA) 44 MCG/ACT inhaler 2 puff  2 puff Inhalation BID Benjamine Mola, FNP   2 puff at 10/16/13 0804  . ibuprofen (ADVIL,MOTRIN) tablet 600 mg  600 mg Oral Q6H PRN Benjamine Mola, FNP   600 mg at 10/16/13 1101  . insulin aspart (novoLOG) injection 0-9 Units  0-9 Units Subcutaneous TID WC Benjamine Mola, FNP   3 Units at 10/16/13 [phone]  . insulin aspart (novoLOG) injection 8 Units  8 Units Subcutaneous TID WC Benjamine Mola, FNP   8 Units at 10/16/13 [phone]  . insulin glargine (LANTUS) injection 30 Units  30 Units Subcutaneous QHS Laverle Hobby, PA-C   30 Units at 10/15/13 2200   . lisinopril (PRINIVIL,ZESTRIL) tablet 10 mg  10 mg Oral q morning - 10a Benjamine Mola, FNP   10 mg at 10/16/13 1059  . magnesium hydroxide (MILK OF MAGNESIA) suspension 30 mL  30 mL Oral Daily PRN Benjamine Mola, FNP      . metoprolol (LOPRESSOR) tablet 25 mg  25 mg Oral PRN Benjamine Mola, FNP      . montelukast (SINGULAIR) tablet 10 mg  10 mg Oral q morning - 10a Benjamine Mola, FNP   10 mg at 10/16/13 1059  . traZODone (DESYREL) tablet 50 mg  50 mg Oral QHS,MR X 1 Laverle Hobby, PA-C   50 mg at 10/15/13 2118    Lab Results:  Results for orders placed during the hospital encounter of 10/14/13 (from the past 48 hour(s))  GLUCOSE, CAPILLARY     Status: Abnormal   Collection Time    10/14/13  3:53 PM      Result Value Range   Glucose-Capillary 69 (*) 70 - 99 mg/dL  GLUCOSE, CAPILLARY     Status: Abnormal   Collection Time    10/14/13  5:14 PM      Result Value Range   Glucose-Capillary 122 (*) 70 - 99 mg/dL   Comment 1 Notify RN    GLUCOSE, CAPILLARY     Status: Abnormal   Collection Time    10/14/13  8:49 PM      Result Value Range   Glucose-Capillary 100 (*) 70 - 99 mg/dL  GLUCOSE, CAPILLARY     Status: Abnormal   Collection Time    10/15/13  6:19 AM      Result Value Range   Glucose-Capillary 124 (*) 70 - 99 mg/dL  GLUCOSE, CAPILLARY     Status: Abnormal   Collection Time    10/15/13 11:45 AM      Result Value Range   Glucose-Capillary 193 (*) 70 - 99 mg/dL  GLUCOSE, CAPILLARY     Status: Abnormal   Collection Time    10/15/13  4:57 PM      Result Value Range   Glucose-Capillary 238 (*) 70 - 99 mg/dL  GLUCOSE, CAPILLARY     Status: Abnormal   Collection Time    10/15/13  8:23 PM      Result Value Range   Glucose-Capillary 261 (*) 70 - 99 mg/dL  GLUCOSE, CAPILLARY     Status: Abnormal   Collection Time    10/16/13  6:14 AM      Result Value Range   Glucose-Capillary 233 (*) 70 - 99 mg/dL    Physical Findings: AIMS: Facial and Oral Movements Muscles of  Facial Expression: None, normal Lips and Perioral Area: None, normal Jaw: None, normal Tongue: None, normal,Extremity Movements Upper (arms, wrists, hands, fingers): None, normal Lower (legs, knees, ankles, toes): None, normal, Trunk Movements Neck, shoulders, hips: None, normal, Overall Severity Severity of abnormal movements (highest score from questions above): None, normal  Incapacitation due to abnormal movements: None, normal Patient's awareness of abnormal movements (rate only patient's report): No Awareness, Dental Status Current problems with teeth and/or dentures?: No Does patient usually wear dentures?: No  CIWA:    COWS:     Treatment Plan Summary: Daily contact with patient to assess and evaluate symptoms and progress in treatment Medication management  Plan: Treatment Plan/Recommendations:   1. Admit for crisis management and stabilization. 2. Medication management to reduce current symptoms to base line and improve the patient's overall level of functioning. Continue Cymbalta 60 mg daily and Abilify 2 mg at bedtime and continue other home medications for diabetes and blood pressure. 3. Treat health problems as indicated. 4. Develop treatment plan to decrease risk of relapse upon discharge and to reduce the need for readmission. 5. Psycho-social education regarding relapse prevention and self care. 6. Health care follow up as needed for medical problems. 7. Restart home medications where appropriate.   Medical Decision Making Problem Points:  Established problem, worsening (2), New problem, with no additional work-up planned (3), Review of last therapy session (1) and Review of psycho-social stressors (1) Data Points:  Review or order clinical lab tests (1) Review or order medicine tests (1) Review of medication regiment & side effects (2) Review of new medications or change in dosage (2)  I certify that inpatient services furnished can reasonably be expected to  improve the patient's condition.   Cary Lothrop,JANARDHAHA R. 10/16/2013, 11:13 AM

## 2013-10-17 DIAGNOSIS — F411 Generalized anxiety disorder: Secondary | ICD-10-CM

## 2013-10-17 DIAGNOSIS — X838XXA Intentional self-harm by other specified means, initial encounter: Secondary | ICD-10-CM

## 2013-10-17 DIAGNOSIS — F339 Major depressive disorder, recurrent, unspecified: Secondary | ICD-10-CM

## 2013-10-17 LAB — GLUCOSE, CAPILLARY
GLUCOSE-CAPILLARY: 320 mg/dL — AB (ref 70–99)
Glucose-Capillary: 130 mg/dL — ABNORMAL HIGH (ref 70–99)
Glucose-Capillary: 280 mg/dL — ABNORMAL HIGH (ref 70–99)

## 2013-10-17 MED ORDER — ARIPIPRAZOLE 2 MG PO TABS: 2.0000 mg | ORAL_TABLET | Freq: Every day | ORAL | Status: DC

## 2013-10-17 MED ORDER — TRAZODONE HCL 50 MG PO TABS: 50.0000 mg | ORAL_TABLET | Freq: Every evening | ORAL | Status: DC | PRN

## 2013-10-17 MED ORDER — DULOXETINE HCL 60 MG PO CPEP: 60.0000 mg | ORAL_CAPSULE | Freq: Every day | ORAL | Status: DC

## 2013-10-17 MED ORDER — CLONAZEPAM 0.5 MG PO TABS: 0.5000 mg | ORAL_TABLET | Freq: Two times a day (BID) | ORAL | Status: DC | PRN

## 2013-10-17 NOTE — Discharge Instructions (Signed)
Depression, Adult °Depression is feeling sad, low, down in the dumps, blue, gloomy, or empty. In general, there are two kinds of depression: °· Normal sadness or grief. This can happen after something upsetting. It often goes away on its own within 2 weeks. After losing a loved one (bereavement), normal sadness and grief may last longer than two weeks. It usually gets better with time. °· Clinical depression. This kind lasts longer than normal sadness or grief. It keeps you from doing the things you normally do in life. It is often hard to function at home, work, or at school. It may affect your relationships with others. Treatment is often needed. °GET HELP RIGHT AWAY IF: °· You have thoughts about hurting yourself or others. °· You lose touch with reality (psychotic symptoms). You may: °· See or hear things that are not real. °· Have untrue beliefs about your life or people around you. °· Your medicine is giving you problems. °MAKE SURE YOU: °· Understand these instructions. °· Will watch your condition. °· Will get help right away if you are not doing well or get worse. °Document Released: 10/28/2010 Document Revised: 06/19/2012 Document Reviewed: 01/25/2012 °Exit[employer]® Patient Information ©2014 [employer]. ° °

## 2013-10-17 NOTE — Tx Team (Addendum)
Interdisciplinary Treatment Plan Update   Date Reviewed:  10/17/2013  Time Reviewed:  9:38 AM  Progress in Treatment:   Attending groups: Yes Participating in groups: Yes Taking medication as prescribed: Yes  Tolerating medication: Yes Family/Significant other contact made: Yes, collateral contact with mother. Patient understands diagnosis: Yes  Discussing patient identified problems/goals with staff: Yes Medical problems stabilized or resolved: Yes Denies suicidal/homicidal ideation: Yes Patient has not harmed self or others: Yes  For review of initial/current patient goals, please see plan of care.  Estimated Length of Stay:  Discharge today  Reasons for Continued Hospitalization:   New Problems/Goals identified:    Discharge Plan or Barriers:   Home with outpatient follow up [facility]  Additional Comments:   N/A  Attendees:  Patient:  [name] 10/17/2013 9:38 AM   Signature: Mylinda Latina, MD 10/17/2013 9:38 AM  Signature:   10/17/2013 9:38 AM  Signature: [name], RN 10/17/2013 9:38 AM  Signature: 10/17/2013 9:38 AM  Signature:  10/17/2013 9:38 AM  Signature:  Joette Catching, LCSW 10/17/2013 9:38 AM  Signature:  Regan Lemming, LCSW 10/17/2013 9:38 AM  Signature:  Lucinda Dell, Care Coordinator 10/17/2013 9:38 AM  Signature:   10/17/2013 9:38 AM  Signature:  10/17/2013  9:38 AM  Signature:   10/17/2013  9:38 AM  Signature:   10/17/2013  9:38 AM    Scribe for Treatment Team:   Joette Catching,  10/17/2013 9:38 AM

## 2013-10-17 NOTE — Progress Notes (Signed)
[facility] Adult Case Management Discharge Plan :  Will you be returning to the same living situation after discharge: No, patient to discharge home with parents. At discharge, do you have transportation home?:Yes,  Patient has transportation. Do you have the ability to pay for your medications:Yes,  Patient is able to obtain medicatons.  Release of information consent forms completed and in the chart;  Patient's signature needed at discharge.  Patient to Follow up at: Follow-up Information   Follow up with Dr. Adele Schilder - [facility] On 11/11/2013. (You are scheduled Dr. Adele Schilder on Tuesday, November 11, 2013 at [place])    Contact information:   [address] [place], [place]   [address]  [phone]      Follow up with [facility] On 10/30/2013. (You are scheduled with Benay Pillow on Thursday, October 30, 2013 at 11:00 AM)    Contact information:   [place], [place]   [address]  [phone]      Patient denies SI/HI:  Patient no longer endorsing SI/HI or other thoughts of self harm.   Safety Planning and Suicide Prevention discussed:  .Reviewed with all patients during discharge planning group   [name] 10/17/2013, 10:12 AM

## 2013-10-17 NOTE — Progress Notes (Signed)
Discharge Note: Discharge instructions and prescriptions given to patient. Patient verbalized understanding of discharge instructions and prescriptions. Returned belongings to patient. Denies SI/HI/AVH. Patient d/c without incident to the lobby and transported by fiancee'.
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
## 2013-10-17 NOTE — Progress Notes (Signed)
Adult Psychoeducational Group Note  Date:  10/17/2013 Time:  1:37 PM  Group Topic/Focus:  Healthy Communication:   The focus of this group is to discuss communication, barriers to communication, as well as healthy ways to communicate with others.  Participation Level:  Active  Participation Quality:  Appropriate  Affect:  Excited  Cognitive:  Appropriate  Insight: Appropriate  Engagement in Group:  Engaged  Modes of Intervention:  Activity and Socialization  Additional Comments:   Pt attended group and was engaged in the activity. The activity was charades.   [name] P 10/17/2013, 1:37 PM
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
## 2013-10-17 NOTE — B[facility] Group Notes (Signed)
[facility] LCSW Aftercare Discharge Planning Group Note   10/17/2013 10:11 AM    Participation Quality:  Appropraite  Mood/Affect:  Appropriate  Depression Rating:  0  Anxiety Rating:  0  Thoughts of Suicide:  No  Will you contract for safety?   NA  Current AVH:  No  Plan for Discharge/Comments:  Patient attended discharge planning group and actively participated in group.  She reports doing well and ready to discharge home today.  She will follow up with  [facility].   She shared she also plans to follow up with [facility].  CSW provided all participants with daily workbook.   Transportation Means: Patient has transportation.   Supports:  Patient has a support system.   Gevon Markus, Eulas Post

## 2013-10-22 NOTE — Progress Notes (Signed)
Patient Discharge Instructions:  Next Level Care Provider Has Access to the EMR, 10/22/13 Records provided to [facility] via CHL/Epic access.  Patsey Berthold, 10/22/2013, 2:08 PM

## 2013-10-23 ENCOUNTER — Ambulatory Visit (HOSPITAL_COMMUNITY): Payer: Self-pay | Admitting: Psychiatry

## 2013-10-30 ENCOUNTER — Ambulatory Visit (HOSPITAL_COMMUNITY): Payer: Self-pay | Admitting: Psychology

## 2013-11-04 ENCOUNTER — Ambulatory Visit: Payer: Medicaid Other | Admitting: Family Medicine

## 2013-11-10 ENCOUNTER — Encounter: Payer: Self-pay | Admitting: Physician Assistant

## 2013-11-10 ENCOUNTER — Ambulatory Visit (INDEPENDENT_AMBULATORY_CARE_PROVIDER_SITE_OTHER): Payer: 59 | Admitting: Physician Assistant

## 2013-11-10 VITALS — BP 124/90 | HR 84 | Temp 98.1°F | Resp 18 | Ht 64.75 in | Wt 196.0 lb

## 2013-11-10 DIAGNOSIS — R52 Pain, unspecified: Secondary | ICD-10-CM

## 2013-11-10 DIAGNOSIS — R3915 Urgency of urination: Secondary | ICD-10-CM

## 2013-11-10 DIAGNOSIS — IMO0002 Reserved for concepts with insufficient information to code with codable children: Secondary | ICD-10-CM

## 2013-11-10 DIAGNOSIS — J45909 Unspecified asthma, uncomplicated: Secondary | ICD-10-CM

## 2013-11-10 DIAGNOSIS — E108 Type 1 diabetes mellitus with unspecified complications: Secondary | ICD-10-CM

## 2013-11-10 DIAGNOSIS — R509 Fever, unspecified: Secondary | ICD-10-CM

## 2013-11-10 DIAGNOSIS — E1065 Type 1 diabetes mellitus with hyperglycemia: Secondary | ICD-10-CM

## 2013-11-10 HISTORY — DX: Unspecified asthma, uncomplicated: J45.909

## 2013-11-10 LAB — URINALYSIS, ROUTINE W REFLEX MICROSCOPIC
Bilirubin Urine: NEGATIVE
Glucose, UA: 500 mg/dL — AB
Ketones, ur: NEGATIVE mg/dL
LEUKOCYTES UA: NEGATIVE
NITRITE: NEGATIVE
Protein, ur: 30 mg/dL — AB
SPECIFIC GRAVITY, URINE: 1.025 (ref 1.005–1.030)
UROBILINOGEN UA: 0.2 mg/dL (ref 0.0–1.0)
pH: 5.5 (ref 5.0–8.0)

## 2013-11-10 LAB — INFLUENZA A AND B
INFLUENZA B AG: NEGATIVE
Inflenza A Ag: NEGATIVE

## 2013-11-10 LAB — URINALYSIS, MICROSCOPIC ONLY
CASTS: NONE SEEN
Crystals: NONE SEEN

## 2013-11-10 MED ORDER — FLUTICASONE-SALMETEROL 230-21 MCG/ACT IN AERO: 1.0000 | INHALATION_SPRAY | Freq: Two times a day (BID) | RESPIRATORY_TRACT | Status: DC

## 2013-11-10 NOTE — Progress Notes (Signed)
Patient ID: [name] MRN: [id number], DOB: Dec 07, 1985, 28 y.o. Date of Encounter: @DATE @  Chief Complaint:  Chief Complaint  Patient presents with  . c/o fever, sore throat, body aches    x 1 day, prob with asthma last week, also thinks may have UTI    HPI: 28 y.o. year old white female  presents with multiple complaints.  Asthma: He reports that over the past month in general she has been having to use her albuterol at least 2 days per week. Office visit they are in the process of moving there has been a lot of dust. Says that she recently had to start taking some oral prednisone.--Says that it was an old prescription that she had been given in the past that she did not ever need back then. He is not certain a milligram of the pills was that was written to take one pill daily rather than a taper. Says that she took it Thursday, Friday, Saturday. (It is now a Monday). Says her breathing is better. Says that she did not have to use her albuterol nebulizer last night but did still need her inhaler.  Illness: Reports that this started yesterday. She felt achy and felt as if she had chills. However when she checked her temperature was 98.9. Had some sore throat yesterday but her throat is much better today. Has had some headache and also fatigue. Has had a little bit of cough. Has had no nasal congestion or mucus.  Urine:  Wants to check a UA while she is here. Has noticed some odor and also some urgency. No dysuria. No frequency. No suprapubic pain. No back pain. No fevers or chills.   Past Medical History  Diagnosis Date  . Asthma   . Tachycardia     baseline tachycardia   . Retinopathy due to secondary diabetes   . Diabetic gastroparesis   . 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 Diabetic neuropathy, type I diabetes mellitus   . Polyneuropathy in diabetes(357.2)   . Anxiety   . Hypertension   . Depression   . Asthma 11/10/2013     Home Meds: See attached medication section for current medication list. Any medications entered into computer today will not appear on this note's list. The medications listed below were entered prior to today. Current Outpatient Prescriptions on File Prior to Visit  Medication Sig Dispense Refill  . albuterol (PROVENTIL HFA;VENTOLIN HFA) 108 (90 BASE) MCG/ACT inhaler Inhale 2 puffs into the lungs every 6 (six) hours as needed. As needed for shortness of breath.  1 Inhaler  11  . ARIPiprazole (ABILIFY) 2 MG tablet Take 1 tablet (2 mg total) by mouth daily.  30 tablet  0  . clonazePAM (KLONOPIN) 0.5 MG tablet Take 1 tablet (0.5 mg total) by mouth 2 (two) times daily as needed (anxiety).  14 tablet  0  . DULoxetine (CYMBALTA) 60 MG capsule Take 1 capsule (60 mg total) by mouth daily.  30 capsule  0  . insulin glargine (LANTUS) 100 UNIT/ML injection Inject 0.3 mLs (30 Units total) into the skin daily.  10 mL  0  . lisinopril (PRINIVIL,ZESTRIL) 10 MG tablet Take 1 tablet (10 mg total) by mouth every morning.      . metoprolol tartrate (LOPRESSOR) 25 MG tablet Take 1 tablet (25 mg total) by mouth as needed (for tachycardia lasting more than 20 minutes).      . montelukast (SINGULAIR) 10  MG tablet Take 1 tablet (10 mg total) by mouth every morning.      . traZODone (DESYREL) 50 MG tablet Take 1 tablet (50 mg total) by mouth at bedtime and may repeat dose one time if needed.  30 tablet  0   No current facility-administered medications on file prior to visit.    Allergies:  Allergies  Allergen Reactions  . Ceftin Rash    History   Social History  . Marital Status: Married    Spouse Name: N/A    Number of Children: N/A  . Years of Education: N/A   Occupational History  . Not on file.   Social History Main Topics  . Smoking status: Never Smoker   . Smokeless tobacco: Never Used  . Alcohol Use: No  . Drug Use: No  . Sexual Activity: Yes    Birth Control/ Protection: IUD   Other  Topics Concern  . Not on file   Social History Narrative   Pt has a daughter and boyfriend.           Family History  Problem Relation Age of Onset  . Hypertension       Review of Systems:  See HPI for pertinent ROS. All other ROS negative.    Physical Exam: Blood pressure 124/90, pulse 84, temperature 98.1 F (36.7 C), temperature source Oral, resp. rate 18, height 5' 4.75" (1.645 m), weight 196 lb (88.905 kg)., Body mass index is 32.85 kg/(m^2). General: WNWD WF. Appears in no acute distress. Head: Normocephalic, atraumatic, eyes without discharge, sclera non-icteric, nares are without discharge. Bilateral auditory canals clear, TM's are without perforation, pearly grey and translucent with reflective cone of light bilaterally. Oral cavity moist, posterior pharynx without exudate, erythema, peritonsillar abscess. No sinus tenderness with percussion.  Neck: Supple. No thyromegaly. No lymphadenopathy. Lungs: Clear bilaterally to auscultation without wheezes, rales, or rhonchi. Breathing is unlabored. Currently lungs are clear with good breath sounds throughout. No wheezes at present. Heart: RRR with S1 S2. No murmurs, rubs, or gallops. Abdomen: Soft, non-tender, non-distended with normoactive bowel sounds. No hepatomegaly. No rebound/guarding. No obvious abdominal masses. Musculoskeletal:  Strength and tone normal for age. Extremities/Skin: Warm and dry.  Neuro: Alert and oriented X 3. Moves all extremities spontaneously. Gait is normal. CNII-XII grossly in tact. Psych:  Responds to questions appropriately with a normal affect.   Results for orders placed in visit on 11/10/13  INFLUENZA A AND B      Result Value Range   Source-INFBD NASAL     Inflenza A Ag NEG  Negative   Influenza B Ag NEG  Negative  URINALYSIS, ROUTINE W REFLEX MICROSCOPIC      Result Value Range   Color, Urine YELLOW  YELLOW   APPearance CLEAR  CLEAR   Specific Gravity, Urine 1.025  1.005 - 1.030   pH 5.5   5.0 - 8.0   Glucose, UA 500 (*) NEG mg/dL   Bilirubin Urine NEG  NEG   Ketones, ur NEG  NEG mg/dL   Hgb urine dipstick MOD (*) NEG   Protein, ur 30 (*) NEG mg/dL   Urobilinogen, UA 0.2  0.0 - 1.0 mg/dL   Nitrite NEG  NEG   Leukocytes, UA NEG  NEG  URINALYSIS, MICROSCOPIC ONLY      Result Value Range   Squamous Epithelial / LPF RARE  RARE   Crystals NONE SEEN  NONE SEEN   Casts NONE SEEN  NONE SEEN   WBC, UA 0-2  <  3 WBC/hpf   RBC / HPF 0-2  <3 RBC/hpf   Bacteria, UA FEW (*) RARE     ASSESSMENT AND PLAN:  28 y.o. year old female with  1. Fever, unspecified - Influenza a and b  2. Body aches - Influenza a and b Influenza is negative. Feel that her symptoms are secondary to a virus. She can take over-the-counter Tylenol or Motrin as needed for body aches. Follow up if symptoms worsen significantly or do not resolve in one week. 3. Urgency of urination - Urinalysis, Routine w reflex microscopic  4. Type I (juvenile type) diabetes mellitus with unspecified complication, uncontrolled  5. Asthma Stop her Pulmicort and change this to Advair. Continue Singulair. Continue albuterol as needed. Follow up if continues to need albuterol more than 2 times per week. - fluticasone-salmeterol (ADVAIR HFA) 230-21 MCG/ACT inhaler; Inhale 1 puff into the lungs 2 (two) times daily.  Dispense: 1 Inhaler; Refill: [address] [place], [place], [facility] 11/10/2013 12:04 PM

## 2013-11-11 ENCOUNTER — Ambulatory Visit (HOSPITAL_COMMUNITY): Payer: Self-pay | Admitting: Psychiatry

## 2013-11-11 ENCOUNTER — Other Ambulatory Visit: Payer: Self-pay | Admitting: Family Medicine

## 2013-11-13 ENCOUNTER — Ambulatory Visit (INDEPENDENT_AMBULATORY_CARE_PROVIDER_SITE_OTHER): Payer: 59 | Admitting: Psychology

## 2013-11-13 DIAGNOSIS — F339 Major depressive disorder, recurrent, unspecified: Secondary | ICD-10-CM

## 2013-11-14 ENCOUNTER — Encounter (HOSPITAL_COMMUNITY): Payer: Self-pay | Admitting: Psychology
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
## 2013-11-24 ENCOUNTER — Telehealth (HOSPITAL_COMMUNITY): Payer: Self-pay | Admitting: *Deleted

## 2013-11-24 ENCOUNTER — Ambulatory Visit (HOSPITAL_COMMUNITY): Payer: Self-pay | Admitting: Psychiatry

## 2013-11-24 NOTE — Telephone Encounter (Signed)
Patient requested refills of Cymbalta, Abilify and Klonopin. States she needs them as soon as possible

## 2013-11-25 ENCOUNTER — Other Ambulatory Visit (HOSPITAL_COMMUNITY): Payer: Self-pay | Admitting: Psychiatry

## 2013-11-25 DIAGNOSIS — F32A Depression, unspecified: Secondary | ICD-10-CM

## 2013-11-25 DIAGNOSIS — F329 Major depressive disorder, single episode, unspecified: Secondary | ICD-10-CM

## 2013-11-25 MED ORDER — DULOXETINE HCL 60 MG PO CPEP: 60.0000 mg | ORAL_CAPSULE | Freq: Every day | ORAL | Status: DC

## 2013-11-25 MED ORDER — ARIPIPRAZOLE 2 MG PO TABS: 2.0000 mg | ORAL_TABLET | Freq: Every day | ORAL | Status: DC

## 2013-11-26 ENCOUNTER — Ambulatory Visit (HOSPITAL_COMMUNITY): Payer: Self-pay | Admitting: Psychology

## 2013-11-26 MED ORDER — CLONAZEPAM 0.5 MG PO TABS: 0.5000 mg | ORAL_TABLET | Freq: Two times a day (BID) | ORAL | Status: DC | PRN

## 2013-11-26 NOTE — Addendum Note (Signed)
Addended by: Rolland Bimler on: 11/26/2013 11:44 AM   Modules accepted: Orders

## 2013-11-26 NOTE — Telephone Encounter (Addendum)
Per Dr. Adele Schilder, refill Klonopin RX with 15 tablets until appt 12/08/13

## 2013-12-03 ENCOUNTER — Ambulatory Visit: Payer: 59 | Admitting: Obstetrics & Gynecology

## 2013-12-05 ENCOUNTER — Encounter: Payer: Self-pay | Admitting: Obstetrics & Gynecology

## 2013-12-05 ENCOUNTER — Ambulatory Visit (INDEPENDENT_AMBULATORY_CARE_PROVIDER_SITE_OTHER): Payer: 59 | Admitting: Obstetrics & Gynecology

## 2013-12-05 ENCOUNTER — Ambulatory Visit: Payer: 59 | Admitting: Obstetrics & Gynecology

## 2013-12-05 VITALS — BP 108/76 | HR 99 | Temp 98.7°F | Ht 66.0 in | Wt 199.0 lb

## 2013-12-05 DIAGNOSIS — Z3202 Encounter for pregnancy test, result negative: Secondary | ICD-10-CM

## 2013-12-05 DIAGNOSIS — Z30430 Encounter for insertion of intrauterine contraceptive device: Secondary | ICD-10-CM

## 2013-12-05 NOTE — Progress Notes (Signed)
IUD Insertion Procedure Note  Pre-operative Diagnosis: Desires contraception  Post-operative Diagnosis: Same  Indications: contraception  Procedure Details  Urine pregnancy test was done and result was negative.  The risks (including infection, bleeding, pain, and uterine perforation) and benefits of the procedure were explained to the patient and Written informed consent was obtained.    Cervix cleansed with Betadine. Uterus sounded to 7 cm. IUD inserted without difficulty. String visible and trimmed. Patient tolerated procedure well.  IUD Information: Mirena.  Condition: Stable  Complications: None  Plan:  The patient was advised to call for any fever or for prolonged or severe pain or bleeding. She was advised to use OTC analgesics as needed for mild to moderate pain.

## 2013-12-09 ENCOUNTER — Encounter: Payer: Self-pay | Admitting: Obstetrics & Gynecology

## 2013-12-11 ENCOUNTER — Emergency Department (HOSPITAL_COMMUNITY)
Admission: EM | Admit: 2013-12-11 | Discharge: 2013-12-11 | Disposition: A | Discharge status: Home / Self Care | Payer: 59 | Attending: Emergency Medicine | Admitting: Emergency Medicine

## 2013-12-11 ENCOUNTER — Encounter (HOSPITAL_COMMUNITY): Payer: Self-pay | Admitting: Emergency Medicine

## 2013-12-11 DIAGNOSIS — E11319 Type 2 diabetes mellitus with unspecified diabetic retinopathy without macular edema: Secondary | ICD-10-CM | POA: Insufficient documentation

## 2013-12-11 DIAGNOSIS — E162 Hypoglycemia, unspecified: Secondary | ICD-10-CM

## 2013-12-11 DIAGNOSIS — F411 Generalized anxiety disorder: Secondary | ICD-10-CM | POA: Insufficient documentation

## 2013-12-11 DIAGNOSIS — E1142 Type 2 diabetes mellitus with diabetic polyneuropathy: Secondary | ICD-10-CM | POA: Insufficient documentation

## 2013-12-11 DIAGNOSIS — E1069 Type 1 diabetes mellitus with other specified complication: Secondary | ICD-10-CM | POA: Insufficient documentation

## 2013-12-11 DIAGNOSIS — E1039 Type 1 diabetes mellitus with other diabetic ophthalmic complication: Secondary | ICD-10-CM | POA: Insufficient documentation

## 2013-12-11 DIAGNOSIS — F329 Major depressive disorder, single episode, unspecified: Secondary | ICD-10-CM | POA: Insufficient documentation

## 2013-12-11 DIAGNOSIS — Z87442 Personal history of urinary calculi: Secondary | ICD-10-CM | POA: Insufficient documentation

## 2013-12-11 DIAGNOSIS — F3289 Other specified depressive episodes: Secondary | ICD-10-CM | POA: Insufficient documentation

## 2013-12-11 DIAGNOSIS — Z794 Long term (current) use of insulin: Secondary | ICD-10-CM | POA: Insufficient documentation

## 2013-12-11 DIAGNOSIS — IMO0002 Reserved for concepts with insufficient information to code with codable children: Secondary | ICD-10-CM | POA: Insufficient documentation

## 2013-12-11 DIAGNOSIS — E1339 Other specified diabetes mellitus with other diabetic ophthalmic complication: Secondary | ICD-10-CM | POA: Insufficient documentation

## 2013-12-11 DIAGNOSIS — I10 Essential (primary) hypertension: Secondary | ICD-10-CM | POA: Insufficient documentation

## 2013-12-11 DIAGNOSIS — J45909 Unspecified asthma, uncomplicated: Secondary | ICD-10-CM | POA: Insufficient documentation

## 2013-12-11 DIAGNOSIS — E1049 Type 1 diabetes mellitus with other diabetic neurological complication: Secondary | ICD-10-CM | POA: Insufficient documentation

## 2013-12-11 DIAGNOSIS — Z79899 Other long term (current) drug therapy: Secondary | ICD-10-CM | POA: Insufficient documentation

## 2013-12-11 LAB — URINALYSIS, ROUTINE W REFLEX MICROSCOPIC
Bilirubin Urine: NEGATIVE
KETONES UR: 15 mg/dL — AB
LEUKOCYTES UA: NEGATIVE
Nitrite: NEGATIVE
PH: 5 (ref 5.0–8.0)
Protein, ur: NEGATIVE mg/dL
SPECIFIC GRAVITY, URINE: 1.034 — AB (ref 1.005–1.030)
Urobilinogen, UA: 0.2 mg/dL (ref 0.0–1.0)

## 2013-12-11 LAB — URINE MICROSCOPIC-ADD ON

## 2013-12-11 LAB — CBG MONITORING, ED
GLUCOSE-CAPILLARY: 529 mg/dL — AB (ref 70–99)
Glucose-Capillary: 245 mg/dL — ABNORMAL HIGH (ref 70–99)

## 2013-12-11 LAB — POC URINE PREG, ED: PREG TEST UR: NEGATIVE

## 2013-12-11 NOTE — ED Notes (Signed)
Pt reports she did not eat dinner last night, pt reports hx of intentionally messing up her insulin dosages. Pt reports this was not the case at all this time, pt reports she truly has been very careful with her dosages and this was truly a problem with not having her medication for a day. Pt denies nausea at this time. Pt given a [place] sandwich per MD approval. Pt states she has been trying to get pregnant. PT denies nausea at this time. Pt reports generalized weakness and fatigue.

## 2013-12-11 NOTE — ED Notes (Signed)
Urine collected.

## 2013-12-11 NOTE — ED Provider Notes (Signed)
CSN: [id number]     Arrival date & time 12/11/13  0540 History   First MD Initiated Contact with Patient 12/11/13 0544     Chief Complaint  Patient presents with  . Hypoglycemia     (Consider location/radiation/quality/duration/timing/severity/associated sxs/prior Treatment) HPI Comments: 28 year old female, history of diabetes on an insulin pump presents with hypoglycemic episode when she was found to be altered by her husband at home. According to the patient who is now back at her baseline she had recently run out of her diabetic medications for her insulin pump, she was without medication, recently replaced it with Lantus and last night switched to Lantus back to short acting insulin. She did not have any dinner, she has chronic gastroparesis which limits her dietary intake and consequently became hypoglycemic over the evening. This was discovered within the last hour by her husband who gave her glucagon prior to paramedic arrival. Initial blood sugar was measured at 35 by the husband, 64 by EMS, she then was able to take a peanut butter and jelly sandwich and it improved to 133 prior to arrival. Now that the patient is awake and alert and oriented she denies any complaints including chest pain, shortness of breath, fever, nausea, vomiting, rash, swelling, blurry vision or headache. The patient states that she had a recent visit to the hospital because of suicidal condition and overdose of insulin she states that she is now very happy, well adjusted, she and her husband are trying to get pregnant and doing well. She has a bright affect  Patient is a 28 y.o. female presenting with hypoglycemia. The history is provided by the patient and medical records.  Hypoglycemia   Past Medical History  Diagnosis Date  . Asthma   . Tachycardia     baseline tachycardia   . Retinopathy due to secondary diabetes   . Diabetic gastroparesis   . 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 Diabetic neuropathy, type I diabetes mellitus   . Polyneuropathy in diabetes(357.2)   . Anxiety   . Hypertension   . Depression   . Asthma 11/10/2013  . Headache(784.0)   . Kidney stones    Past Surgical History  Procedure Laterality Date  . Cholecystectomy    . Cesarean section    . Refractive surgery     Family History  Problem Relation Age of Onset  . Hypertension     History  Substance Use Topics  . Smoking status: Never Smoker   . Smokeless tobacco: Never Used  . Alcohol Use: No   OB History   Grav Para Term Preterm Abortions TAB SAB Ect Mult Living   1 1        1      Review of Systems  All other systems reviewed and are negative.      Allergies  Ceftin  Home Medications   Current Outpatient Rx  Name  Route  Sig  Dispense  Refill  . albuterol (PROVENTIL HFA;VENTOLIN HFA) 108 (90 BASE) MCG/ACT inhaler   Inhalation   Inhale 2 puffs into the lungs every 6 (six) hours as needed. As needed for shortness of breath.   1 Inhaler   11   . brimonidine-timolol (COMBIGAN) 0.2-0.5 % ophthalmic solution   Both Eyes   Place 1 drop into both eyes every 12 (twelve) hours.         . clonazePAM (KLONOPIN) 0.5 MG tablet   Oral   Take 1 tablet (0.5  mg total) by mouth 2 (two) times daily as needed (anxiety).   15 tablet   0   . fluticasone-salmeterol (ADVAIR HFA) 230-21 MCG/ACT inhaler   Inhalation   Inhale 1 puff into the lungs 2 (two) times daily.   1 Inhaler   12   . insulin glargine (LANTUS) 100 UNIT/ML injection   Subcutaneous   Inject 0.3 mLs (30 Units total) into the skin daily.   10 mL   0   . insulin lispro (HUMALOG) 100 UNIT/ML injection   Subcutaneous   Inject into the skin. Has Insulin Pump         . lisinopril (PRINIVIL,ZESTRIL) 10 MG tablet   Oral   Take 1 tablet (10 mg total) by mouth every morning.         . metoprolol tartrate (LOPRESSOR) 25 MG tablet   Oral   Take 1 tablet (25 mg total) by mouth as needed (for  tachycardia lasting more than 20 minutes).         . montelukast (SINGULAIR) 10 MG tablet      TAKE 1 TABLET BY MOUTH EVERY NIGHT AT BEDTIME   30 tablet   11   . Prenatal Vit-Fe Fumarate-FA (PRENATAL MULTIVITAMIN) TABS tablet   Oral   Take 1 tablet by mouth daily at 12 noon.          BP 122/62  Pulse 91  Temp(Src) 97.4 F (36.3 C) (Oral)  Resp 18  Ht 5\' 6"  (1.676 m)  Wt 198 lb (89.812 kg)  BMI 31.97 kg/m2  SpO2 100%  LMP 12/07/2013 Physical Exam  Nursing note and vitals reviewed. Constitutional: She appears well-developed and well-nourished. No distress.  HENT:  Head: Normocephalic and atraumatic.  Mouth/Throat: Oropharynx is clear and moist. No oropharyngeal exudate.  Eyes: Conjunctivae and EOM are normal. Pupils are equal, round, and reactive to light. Right eye exhibits no discharge. Left eye exhibits no discharge. No scleral icterus.  Neck: Normal range of motion. Neck supple. No JVD present. No thyromegaly present.  Cardiovascular: Normal rate, regular rhythm, normal heart sounds and intact distal pulses.  Exam reveals no gallop and no friction rub.   No murmur heard. Pulmonary/Chest: Effort normal and breath sounds normal. No respiratory distress. She has no wheezes. She has no rales.  Abdominal: Soft. Bowel sounds are normal. She exhibits no distension and no mass. There is no tenderness.  Musculoskeletal: Normal range of motion. She exhibits no edema and no tenderness.  Lymphadenopathy:    She has no cervical adenopathy.  Neurological: She is alert. Coordination normal.  Skin: Skin is warm and dry. No rash noted. No erythema.  Psychiatric: She has a normal mood and affect. Her behavior is normal.    ED Course  Procedures (including critical care time) Labs Review Labs Reviewed  URINALYSIS, ROUTINE W REFLEX MICROSCOPIC - Abnormal; Notable for the following:    Color, Urine RED (*)    APPearance CLOUDY (*)    Specific Gravity, Urine 1.034 (*)    Glucose,  UA >1000 (*)    Hgb urine dipstick LARGE (*)    Ketones, ur 15 (*)    All other components within normal limits  CBG MONITORING, ED - Abnormal; Notable for the following:    Glucose-Capillary 245 (*)    All other components within normal limits  CBG MONITORING, ED - Abnormal; Notable for the following:    Glucose-Capillary 529 (*)    All other components within normal limits  URINE MICROSCOPIC-ADD ON  POC URINE PREG, ED   Imaging Review No results found.   EKG Interpretation None      MDM   Final diagnoses:  Hypoglycemia    The patient has a normal exam, normal vital signs, soft nontender abdomen and clear heart and lung sounds and has no complaints other than generalized fatigue at this time. Will check urinalysis, urine pregnancy, feet, and sure that she tolerates oral intake without difficulty.  Urinalysis and labs unremarkable, not pregnant, has not had a recurrent hypoglycemia and is tolerating oral.  Appears stable for discharge at this time.  Johnna Acosta, MD 12/11/13 [phone]

## 2013-12-11 NOTE — ED Notes (Addendum)
CBG 245. 

## 2013-12-11 NOTE — Discharge Instructions (Signed)
Please call your doctor for a followup appointment within 24-48 hours. When you talk to your doctor please let them know that you were seen in the emergency department and have them acquire all of your records so that they can discuss the findings with you and formulate a treatment plan to fully care for your new and ongoing problems. ° °

## 2013-12-11 NOTE — ED Notes (Signed)
Pt states she tossed her urine because her menstrual cycle made the cup messy, pt informed to please urinate again and not toss the cup. Unable to send urine down at this time, will recollect when pt voids.

## 2013-12-11 NOTE — ED Notes (Signed)
Per EMS pt ran out of her diabetic medications for her insulin pump, pt was without her lantus for one full day. Pt's husband's reports the pt was lethargic at home, pt's CBG was 35 at home and he adm IM glucagon. EMS adm half a tube of oral glucose and gave the pt half of a PB&J sandwich. EMS reports pt's CBG was 133. EMS reports pt was still lethargic en route to the department but became more alert upon arrival to department. Pt reports she felt nauseous when she left her home, EMS adm 4mg  of zofran. Pt reports she feels fatigued and weak. 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

## 2013-12-12 ENCOUNTER — Ambulatory Visit (HOSPITAL_COMMUNITY): Payer: Self-pay | Admitting: Psychiatry

## 2013-12-12 NOTE — Patient Instructions (Signed)

## 2013-12-18 ENCOUNTER — Encounter: Payer: Self-pay | Admitting: Physician Assistant

## 2013-12-18 ENCOUNTER — Ambulatory Visit (INDEPENDENT_AMBULATORY_CARE_PROVIDER_SITE_OTHER): Payer: 59 | Admitting: Physician Assistant

## 2013-12-18 VITALS — BP 116/86 | HR 80 | Temp 98.1°F | Resp 18 | Ht 65.25 in | Wt 200.0 lb

## 2013-12-18 DIAGNOSIS — L309 Dermatitis, unspecified: Secondary | ICD-10-CM

## 2013-12-18 DIAGNOSIS — B351 Tinea unguium: Secondary | ICD-10-CM

## 2013-12-18 DIAGNOSIS — L259 Unspecified contact dermatitis, unspecified cause: Secondary | ICD-10-CM

## 2013-12-18 MED ORDER — TRIAMCINOLONE ACETONIDE 0.1 % EX CREA: 1.0000 "application " | TOPICAL_CREAM | Freq: Two times a day (BID) | CUTANEOUS | Status: DC

## 2013-12-18 MED ORDER — TERBINAFINE HCL 250 MG PO TABS: 250.0000 mg | ORAL_TABLET | Freq: Every day | ORAL | Status: DC

## 2013-12-18 NOTE — Progress Notes (Signed)
Patient ID: [name] MRN: [id number], DOB: [birth year], 28 y.o. Date of Encounter: 12/18/2013, 3:32 PM    Chief Complaint:  Chief Complaint  Patient presents with  . eczema flare up  . c/o toe nail fungus     HPI: 28 y.o. year old female says that her eczema has been flaring up. Says that she is getting married in a few weeks and really needs this to clear up prior to the wedding. Also thinks she has toenail fungus onto toenails and is wanting treatment for this.     Home Meds: See attached medication section for any medications that were entered at today's visit. The computer does not put those onto this list.The following list is a list of meds entered prior to today's visit.   Current Outpatient Prescriptions on File Prior to Visit  Medication Sig Dispense Refill  . albuterol (PROVENTIL HFA;VENTOLIN HFA) 108 (90 BASE) MCG/ACT inhaler Inhale 2 puffs into the lungs every 6 (six) hours as needed. As needed for shortness of breath.  1 Inhaler  11  . brimonidine-timolol (COMBIGAN) 0.2-0.5 % ophthalmic solution Place 1 drop into both eyes every 12 (twelve) hours.      . clonazePAM (KLONOPIN) 0.5 MG tablet Take 1 tablet (0.5 mg total) by mouth 2 (two) times daily as needed (anxiety).  15 tablet  0  . fluticasone-salmeterol (ADVAIR HFA) 230-21 MCG/ACT inhaler Inhale 1 puff into the lungs 2 (two) times daily.  1 Inhaler  12  . insulin glargine (LANTUS) 100 UNIT/ML injection Inject 0.3 mLs (30 Units total) into the skin daily.  10 mL  0  . insulin lispro (HUMALOG) 100 UNIT/ML injection Inject into the skin. Has Insulin Pump      . lisinopril (PRINIVIL,ZESTRIL) 10 MG tablet Take 1 tablet (10 mg total) by mouth every morning.      . metoprolol tartrate (LOPRESSOR) 25 MG tablet Take 1 tablet (25 mg total) by mouth as needed (for tachycardia lasting more than 20 minutes).      . montelukast (SINGULAIR) 10 MG tablet TAKE 1 TABLET BY MOUTH EVERY NIGHT AT BEDTIME  30 tablet  11  .  Prenatal Vit-Fe Fumarate-FA (PRENATAL MULTIVITAMIN) TABS tablet Take 1 tablet by mouth daily at 12 noon.       No current facility-administered medications on file prior to visit.    Allergies:  Allergies  Allergen Reactions  . Ceftin Rash      Review of Systems: See HPI for pertinent ROS. All other ROS negative.    Physical Exam: Blood pressure 116/86, pulse 80, temperature 98.1 F (36.7 C), temperature source Oral, resp. rate 18, height 5' 5.25" (1.657 m), weight 200 lb (90.719 kg), last menstrual period 12/07/2013., Body mass index is 33.04 kg/(m^2). General:  Overweight white female . Appears in no acute distress. Lungs: Clear bilaterally to auscultation without wheezes, rales, or rhonchi. Breathing is unlabored. Heart: Regular rhythm. No murmurs, rubs, or gallops. Extremities/Skin: Warm and dry.  Bilateral 1st toes: These toenails are thick and have opague golden coloration. Other toenails are all normal thickness and normal color. Bilateral lower legs have areas of pink erythema with overlying scale. Neuro: Alert and oriented X 3. Moves all extremities spontaneously. Gait is normal. CNII-XII grossly in tact. Psych:  Responds to questions appropriately with a normal affect.     ASSESSMENT AND PLAN:  28 y.o. year old female with  1. Eczema - triamcinolone cream (KENALOG) 0.1 %; Apply 1 application topically 2 (two) times  daily.  Dispense: 30 g; Refill: 0  2. Onychomycosis of toenail Discussed that the medication used to treat toenail fungus has a strong medication it goes through the liver. Need to do LFTs to document normal liver function prior to starting medication. Need to repeat LFTs after about 3 weeks of therapy to document that the treatment is not causing problems with LFTs. She voices understanding and is agreeable to follow up as recommended. - Hepatic function panel - Hepatic function panel; Future - terbinafine (LAMISIL) 250 MG tablet; Take 1 tablet (250 mg  total) by mouth daily.  Dispense: 30 tablet; Refill: 2   Signed, [address] [place], [place], [facility] 12/18/2013 3:32 PM

## 2013-12-19 LAB — HEPATIC FUNCTION PANEL
ALK PHOS: 120 U/L — AB (ref 39–117)
ALT: 10 U/L (ref 0–35)
AST: 11 U/L (ref 0–37)
Albumin: 3.6 g/dL (ref 3.5–5.2)
BILIRUBIN INDIRECT: 0.3 mg/dL (ref 0.2–1.2)
BILIRUBIN TOTAL: 0.4 mg/dL (ref 0.2–1.2)
Bilirubin, Direct: 0.1 mg/dL (ref 0.0–0.3)
Total Protein: 6.2 g/dL (ref 6.0–8.3)

## 2013-12-23 ENCOUNTER — Telehealth: Payer: Self-pay | Admitting: Family Medicine

## 2013-12-23 ENCOUNTER — Encounter: Payer: Self-pay | Admitting: Family Medicine

## 2013-12-23 NOTE — Telephone Encounter (Signed)
Message copied by Olena Mater on Tue Dec 23, 2013  4:33 PM ------      Message from: Dena Billet      Created: Fri Dec 19, 2013  6:24 AM       LFTs are normal. Go ahead and start the medication prescribed yesterday.      Make sure to recheck LFTs in 3 weeks. (this is already ordered yesterday) ------

## 2013-12-23 NOTE — Telephone Encounter (Signed)
Left pt messsage on 3/13, she has not called me back.  Today number is out of order??  Will send letter with lab results and provide recommendations

## 2013-12-24 ENCOUNTER — Ambulatory Visit (HOSPITAL_COMMUNITY): Payer: Self-pay | Admitting: Psychology

## 2013-12-26 ENCOUNTER — Encounter: Payer: Self-pay | Admitting: Family Medicine

## 2013-12-26 ENCOUNTER — Ambulatory Visit (INDEPENDENT_AMBULATORY_CARE_PROVIDER_SITE_OTHER): Payer: 59 | Admitting: Family Medicine

## 2013-12-26 VITALS — BP 120/74 | HR 74 | Temp 97.1°F | Resp 16 | Ht 66.0 in | Wt 194.0 lb

## 2013-12-26 DIAGNOSIS — L739 Follicular disorder, unspecified: Secondary | ICD-10-CM

## 2013-12-26 DIAGNOSIS — L678 Other hair color and hair shaft abnormalities: Secondary | ICD-10-CM

## 2013-12-26 DIAGNOSIS — L738 Other specified follicular disorders: Secondary | ICD-10-CM

## 2013-12-26 MED ORDER — SULFAMETHOXAZOLE-TMP DS 800-160 MG PO TABS: 1.0000 | ORAL_TABLET | Freq: Two times a day (BID) | ORAL | Status: DC

## 2013-12-26 NOTE — Progress Notes (Signed)
Subjective:    Patient ID: [name], female    DOB: 1986-07-23, 28 y.o.   MRN: [id number]  HPI Patient is a 28 year old white female who presents today with painful erythematous pustules in both axilla. They have been present for several days after shaving. She has a history of staph infections. There are no palpable abscesses in either axilla. She denies any fever or chills. There are 5-6, 5 mm pustules in each axilla.   Past Medical History  Diagnosis Date  . Asthma   . Tachycardia     baseline tachycardia   . Retinopathy due to secondary diabetes   . Diabetic gastroparesis   . 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 Diabetic neuropathy, type I diabetes mellitus   . Polyneuropathy in diabetes(357.2)   . Anxiety   . Hypertension   . Depression   . Asthma 11/10/2013  . Headache(784.0)   . Kidney stones    Current Outpatient Prescriptions on File Prior to Visit  Medication Sig Dispense Refill  . albuterol (PROVENTIL HFA;VENTOLIN HFA) 108 (90 BASE) MCG/ACT inhaler Inhale 2 puffs into the lungs every 6 (six) hours as needed. As needed for shortness of breath.  1 Inhaler  11  . brimonidine-timolol (COMBIGAN) 0.2-0.5 % ophthalmic solution Place 1 drop into both eyes every 12 (twelve) hours.      . clonazePAM (KLONOPIN) 0.5 MG tablet Take 1 tablet (0.5 mg total) by mouth 2 (two) times daily as needed (anxiety).  15 tablet  0  . DULoxetine (CYMBALTA) 60 MG capsule Take 60 mg by mouth daily.      . fluticasone-salmeterol (ADVAIR HFA) 230-21 MCG/ACT inhaler Inhale 1 puff into the lungs 2 (two) times daily.  1 Inhaler  12  . insulin glargine (LANTUS) 100 UNIT/ML injection Inject 0.3 mLs (30 Units total) into the skin daily.  10 mL  0  . insulin lispro (HUMALOG) 100 UNIT/ML injection Inject into the skin. Has Insulin Pump      . lisinopril (PRINIVIL,ZESTRIL) 10 MG tablet Take 1 tablet (10 mg total) by mouth every morning.      . metoprolol tartrate  (LOPRESSOR) 25 MG tablet Take 1 tablet (25 mg total) by mouth as needed (for tachycardia lasting more than 20 minutes).      . montelukast (SINGULAIR) 10 MG tablet TAKE 1 TABLET BY MOUTH EVERY NIGHT AT BEDTIME  30 tablet  11  . Prenatal Vit-Fe Fumarate-FA (PRENATAL MULTIVITAMIN) TABS tablet Take 1 tablet by mouth daily at 12 noon.      . terbinafine (LAMISIL) 250 MG tablet Take 1 tablet (250 mg total) by mouth daily.  30 tablet  2  . triamcinolone cream (KENALOG) 0.1 % Apply 1 application topically 2 (two) times daily.  30 g  0   No current facility-administered medications on file prior to visit.   Allergies  Allergen Reactions  . Ceftin Rash   History   Social History  . Marital Status: Married    Spouse Name: N/A    Number of Children: N/A  . Years of Education: N/A   Occupational History  . Not on file.   Social History Main Topics  . Smoking status: Never Smoker   . Smokeless tobacco: Never Used  . Alcohol Use: No  . Drug Use: No  . Sexual Activity: Yes    Partners: Male    Birth Control/ Protection: IUD   Other Topics Concern  . Not on file  Social History Narrative   Pt has a daughter and boyfriend.             Review of Systems  All other systems reviewed and are negative.       Objective:   Physical Exam  Vitals reviewed. Cardiovascular: Normal rate and regular rhythm.   Pulmonary/Chest: Effort normal and breath sounds normal.  Skin: Rash noted. There is erythema.   5-6, 5 mm pustules with erythema at the base in each axilla. There is no palpable abscess that requires incision and drainage in either axilla.        Assessment & Plan:  Folliculitis - Plan: sulfamethoxazole-trimethoprim (BACTRIM DS) 800-160 MG per tablet  Begin Bactrim double strength one by mouth twice a day for 10 days. I warned the patient to monitor for signs or symptoms of an abscess. If an abscess develops we will need to perform an incision and drainage. Also recommended  that she apply warm wet compresses to the axilla 3 times a day for approximately 10 minutes each.

## 2014-03-23 ENCOUNTER — Telehealth: Payer: Self-pay | Admitting: *Deleted

## 2014-03-23 NOTE — Telephone Encounter (Signed)
Pt called in to office requesting Rx for [facility], or generic.  Pt states in message left that she is no longer "trying".   Return call to pt.  Left message on VM that request would be sent to physician for verification and she can expect a return call once advised.  Please advise.

## 2014-03-24 NOTE — Telephone Encounter (Signed)
Ok to prescribe

## 2014-03-25 ENCOUNTER — Telehealth: Payer: Self-pay | Admitting: Family Medicine

## 2014-03-25 NOTE — Telephone Encounter (Signed)
Patient aware and appt made 

## 2014-03-25 NOTE — Telephone Encounter (Signed)
We really need to examine her to make sure it is a sty and not a chalazion b/c the treatment is different.

## 2014-03-25 NOTE — Telephone Encounter (Signed)
Message copied by Alyson Locket on Wed Mar 25, 2014  3:57 PM ------      Message from: Devoria Glassing      Created: Wed Mar 25, 2014  3:07 PM       Patient would like to talk to you about the styes on her eyes and would like to know if we could call her in something for this       [phone] ------

## 2014-03-26 ENCOUNTER — Ambulatory Visit: Payer: 59 | Admitting: Physician Assistant

## 2014-03-26 ENCOUNTER — Telehealth: Payer: Self-pay | Admitting: Family Medicine

## 2014-03-26 ENCOUNTER — Other Ambulatory Visit: Payer: Self-pay | Admitting: *Deleted

## 2014-03-26 DIAGNOSIS — Z309 Encounter for contraceptive management, unspecified: Secondary | ICD-10-CM

## 2014-03-26 MED ORDER — LEVONORGEST-ETH ESTRAD 91-DAY 0.15-0.03 &0.01 MG PO TABS: 1.0000 | ORAL_TABLET | Freq: Every day | ORAL | Status: DC

## 2014-03-26 NOTE — Telephone Encounter (Signed)
Message copied by Alyson Locket on Thu Mar 26, 2014  4:53 PM ------      Message from: Devoria Glassing      Created: Thu Mar 26, 2014  3:53 PM       Patient is calling to ask if she can get her anxiety medication she cannot get into see her therapist until next week please call her at [phone] ------

## 2014-03-26 NOTE — Telephone Encounter (Signed)
Can send Rx for Klonopin 0.5 mg 1 po BID prn  # 30 + 0.

## 2014-03-26 NOTE — Telephone Encounter (Signed)
Call placed to pt making her aware that Rx for Luisa Hart has been sent to her pharmacy.

## 2014-03-26 NOTE — Telephone Encounter (Signed)
?   OK to Refill  

## 2014-03-27 ENCOUNTER — Encounter (HOSPITAL_COMMUNITY): Payer: Self-pay | Admitting: Emergency Medicine

## 2014-03-27 ENCOUNTER — Emergency Department (HOSPITAL_COMMUNITY)
Admission: EM | Admit: 2014-03-27 | Discharge: 2014-03-29 | Disposition: A | Discharge status: Other Acute Inpatient Hospital | Payer: 59 | Attending: Emergency Medicine | Admitting: Emergency Medicine

## 2014-03-27 ENCOUNTER — Inpatient Hospital Stay (HOSPITAL_COMMUNITY): Admission: AD | Admit: 2014-03-27 | Payer: 59 | Source: Intra-hospital | Admitting: Psychiatry

## 2014-03-27 DIAGNOSIS — T50901A Poisoning by unspecified drugs, medicaments and biological substances, accidental (unintentional), initial encounter: Secondary | ICD-10-CM | POA: Insufficient documentation

## 2014-03-27 DIAGNOSIS — I959 Hypotension, unspecified: Secondary | ICD-10-CM | POA: Insufficient documentation

## 2014-03-27 DIAGNOSIS — F3289 Other specified depressive episodes: Secondary | ICD-10-CM | POA: Insufficient documentation

## 2014-03-27 DIAGNOSIS — F332 Major depressive disorder, recurrent severe without psychotic features: Secondary | ICD-10-CM | POA: Diagnosis present

## 2014-03-27 DIAGNOSIS — R45851 Suicidal ideations: Secondary | ICD-10-CM

## 2014-03-27 DIAGNOSIS — E1039 Type 1 diabetes mellitus with other diabetic ophthalmic complication: Secondary | ICD-10-CM | POA: Insufficient documentation

## 2014-03-27 DIAGNOSIS — F32A Depression, unspecified: Secondary | ICD-10-CM

## 2014-03-27 DIAGNOSIS — T438X2A Poisoning by other psychotropic drugs, intentional self-harm, initial encounter: Secondary | ICD-10-CM | POA: Insufficient documentation

## 2014-03-27 DIAGNOSIS — Z794 Long term (current) use of insulin: Secondary | ICD-10-CM | POA: Insufficient documentation

## 2014-03-27 DIAGNOSIS — F1310 Sedative, hypnotic or anxiolytic abuse, uncomplicated: Secondary | ICD-10-CM | POA: Insufficient documentation

## 2014-03-27 DIAGNOSIS — F329 Major depressive disorder, single episode, unspecified: Secondary | ICD-10-CM | POA: Insufficient documentation

## 2014-03-27 DIAGNOSIS — T50902A Poisoning by unspecified drugs, medicaments and biological substances, intentional self-harm, initial encounter: Secondary | ICD-10-CM

## 2014-03-27 DIAGNOSIS — IMO0002 Reserved for concepts with insufficient information to code with codable children: Secondary | ICD-10-CM | POA: Insufficient documentation

## 2014-03-27 DIAGNOSIS — E11319 Type 2 diabetes mellitus with unspecified diabetic retinopathy without macular edema: Secondary | ICD-10-CM | POA: Insufficient documentation

## 2014-03-27 DIAGNOSIS — E11311 Type 2 diabetes mellitus with unspecified diabetic retinopathy with macular edema: Secondary | ICD-10-CM | POA: Insufficient documentation

## 2014-03-27 DIAGNOSIS — F411 Generalized anxiety disorder: Secondary | ICD-10-CM | POA: Diagnosis present

## 2014-03-27 DIAGNOSIS — K3184 Gastroparesis: Secondary | ICD-10-CM | POA: Insufficient documentation

## 2014-03-27 DIAGNOSIS — E1049 Type 1 diabetes mellitus with other diabetic neurological complication: Secondary | ICD-10-CM | POA: Insufficient documentation

## 2014-03-27 DIAGNOSIS — Z79899 Other long term (current) drug therapy: Secondary | ICD-10-CM | POA: Insufficient documentation

## 2014-03-27 DIAGNOSIS — T50902S Poisoning by unspecified drugs, medicaments and biological substances, intentional self-harm, sequela: Secondary | ICD-10-CM

## 2014-03-27 DIAGNOSIS — Z3202 Encounter for pregnancy test, result negative: Secondary | ICD-10-CM | POA: Insufficient documentation

## 2014-03-27 DIAGNOSIS — E1142 Type 2 diabetes mellitus with diabetic polyneuropathy: Secondary | ICD-10-CM | POA: Insufficient documentation

## 2014-03-27 DIAGNOSIS — J45909 Unspecified asthma, uncomplicated: Secondary | ICD-10-CM | POA: Insufficient documentation

## 2014-03-27 DIAGNOSIS — T43294A Poisoning by other antidepressants, undetermined, initial encounter: Secondary | ICD-10-CM | POA: Insufficient documentation

## 2014-03-27 DIAGNOSIS — Z8759 Personal history of other complications of pregnancy, childbirth and the puerperium: Secondary | ICD-10-CM | POA: Insufficient documentation

## 2014-03-27 DIAGNOSIS — T43502A Poisoning by unspecified antipsychotics and neuroleptics, intentional self-harm, initial encounter: Secondary | ICD-10-CM | POA: Insufficient documentation

## 2014-03-27 DIAGNOSIS — R739 Hyperglycemia, unspecified: Secondary | ICD-10-CM

## 2014-03-27 LAB — ETHANOL

## 2014-03-27 LAB — RAPID URINE DRUG SCREEN, HOSP PERFORMED
AMPHETAMINES: NOT DETECTED
BARBITURATES: NOT DETECTED
Benzodiazepines: POSITIVE — AB
Cocaine: NOT DETECTED
OPIATES: NOT DETECTED
Tetrahydrocannabinol: NOT DETECTED

## 2014-03-27 LAB — CBG MONITORING, ED
GLUCOSE-CAPILLARY: 396 mg/dL — AB (ref 70–99)
Glucose-Capillary: 383 mg/dL — ABNORMAL HIGH (ref 70–99)
Glucose-Capillary: 364 mg/dL — ABNORMAL HIGH (ref 70–99)

## 2014-03-27 LAB — COMPREHENSIVE METABOLIC PANEL
ALT: 9 U/L (ref 0–35)
AST: 17 U/L (ref 0–37)
Albumin: 3.3 g/dL — ABNORMAL LOW (ref 3.5–5.2)
Alkaline Phosphatase: 129 U/L — ABNORMAL HIGH (ref 39–117)
BUN: 14 mg/dL (ref 6–23)
CO2: 23 meq/L (ref 19–32)
Calcium: 9.3 mg/dL (ref 8.4–10.5)
Chloride: 96 mEq/L (ref 96–112)
Creatinine, Ser: 0.83 mg/dL (ref 0.50–1.10)
Glucose, Bld: 431 mg/dL — ABNORMAL HIGH (ref 70–99)
Potassium: 5.2 mEq/L (ref 3.7–5.3)
Sodium: 134 mEq/L — ABNORMAL LOW (ref 137–147)
TOTAL PROTEIN: 7 g/dL (ref 6.0–8.3)
Total Bilirubin: 0.3 mg/dL (ref 0.3–1.2)

## 2014-03-27 LAB — ACETAMINOPHEN LEVEL: Acetaminophen (Tylenol), Serum: <15 ug/mL (ref 10–30)

## 2014-03-27 LAB — SALICYLATE LEVEL: Salicylate Lvl: <2 mg/dL — ABNORMAL LOW (ref 2.8–20.0)

## 2014-03-27 LAB — CBC
HEMATOCRIT: 39.4 % (ref 36.0–46.0)
Hemoglobin: 13.8 g/dL (ref 12.0–15.0)
MCH: 28.8 pg (ref 26.0–34.0)
MCHC: 35 g/dL (ref 30.0–36.0)
MCV: 82.3 fL (ref 78.0–100.0)
Platelets: 305 10*3/uL (ref 150–400)
RBC: 4.79 MIL/uL (ref 3.87–5.11)
RDW: 13 % (ref 11.5–15.5)
WBC: 9.8 10*3/uL (ref 4.0–10.5)

## 2014-03-27 LAB — POC URINE PREG, ED: Preg Test, Ur: NEGATIVE

## 2014-03-27 MED ORDER — LISINOPRIL 10 MG PO TABS
10.0000 mg | ORAL_TABLET | Freq: Every morning | ORAL | Status: DC
  Administered 2014-03-28 – 2014-03-29 (×2): 10 mg via ORAL
  Filled 2014-03-27 (×2): qty 1

## 2014-03-27 MED ORDER — INSULIN GLARGINE 100 UNIT/ML ~~LOC~~ SOLN
30.0000 [IU] | Freq: Every day | SUBCUTANEOUS | Status: DC
  Administered 2014-03-28 – 2014-03-29 (×2): 30 [IU] via SUBCUTANEOUS
  Filled 2014-03-27 (×2): qty 0.3

## 2014-03-27 MED ORDER — ONDANSETRON HCL 4 MG PO TABS: 4.0000 mg | ORAL_TABLET | Freq: Three times a day (TID) | ORAL | Status: DC | PRN

## 2014-03-27 MED ORDER — SODIUM CHLORIDE 0.9 % IV BOLUS (SEPSIS)
1000.0000 mL | Freq: Once | INTRAVENOUS | Status: AC
  Administered 2014-03-27: 1000 mL via INTRAVENOUS

## 2014-03-27 MED ORDER — INSULIN ASPART 100 UNIT/ML ~~LOC~~ SOLN
12.0000 [IU] | Freq: Once | SUBCUTANEOUS | Status: AC
  Administered 2014-03-27: 12 [IU] via INTRAVENOUS
  Filled 2014-03-27: qty 1

## 2014-03-27 MED ORDER — ONDANSETRON HCL 4 MG/2ML IJ SOLN
4.0000 mg | Freq: Once | INTRAMUSCULAR | Status: AC
  Administered 2014-03-27: 4 mg via INTRAVENOUS
  Filled 2014-03-27: qty 2

## 2014-03-27 MED ORDER — TERBINAFINE HCL 250 MG PO TABS
250.0000 mg | ORAL_TABLET | Freq: Every day | ORAL | Status: DC
  Administered 2014-03-27 – 2014-03-29 (×3): 250 mg via ORAL
  Filled 2014-03-27 (×3): qty 1

## 2014-03-27 MED ORDER — ALBUTEROL SULFATE HFA 108 (90 BASE) MCG/ACT IN AERS: 2.0000 | INHALATION_SPRAY | Freq: Four times a day (QID) | RESPIRATORY_TRACT | Status: DC | PRN

## 2014-03-27 MED ORDER — ALUM & MAG HYDROXIDE-SIMETH 200-200-20 MG/5ML PO SUSP: 30.0000 mL | ORAL | Status: DC | PRN

## 2014-03-27 MED ORDER — DULOXETINE HCL 60 MG PO CPEP
60.0000 mg | ORAL_CAPSULE | Freq: Every day | ORAL | Status: DC
  Administered 2014-03-28 – 2014-03-29 (×2): 60 mg via ORAL
  Filled 2014-03-27 (×2): qty 1

## 2014-03-27 MED ORDER — IBUPROFEN 200 MG PO TABS
600.0000 mg | ORAL_TABLET | Freq: Three times a day (TID) | ORAL | Status: DC | PRN
  Administered 2014-03-28: 600 mg via ORAL
  Filled 2014-03-27 (×2): qty 3

## 2014-03-27 MED ORDER — ACETAMINOPHEN 325 MG PO TABS
650.0000 mg | ORAL_TABLET | ORAL | Status: DC | PRN
  Administered 2014-03-28 – 2014-03-29 (×2): 650 mg via ORAL
  Filled 2014-03-27 (×2): qty 2

## 2014-03-27 MED ORDER — BRIMONIDINE TARTRATE 0.2 % OP SOLN
1.0000 [drp] | Freq: Two times a day (BID) | OPHTHALMIC | Status: DC
  Administered 2014-03-27 – 2014-03-29 (×5): 1 [drp] via OPHTHALMIC
  Filled 2014-03-27: qty 5

## 2014-03-27 MED ORDER — CLONAZEPAM 0.5 MG PO TABS: 0.5000 mg | ORAL_TABLET | Freq: Two times a day (BID) | ORAL | Status: DC | PRN

## 2014-03-27 MED ORDER — TRAZODONE HCL 50 MG PO TABS
50.0000 mg | ORAL_TABLET | Freq: Every evening | ORAL | Status: DC | PRN
Start: 2014-03-27 — End: 2014-03-29
  Administered 2014-03-28: 50 mg via ORAL
  Filled 2014-03-27: qty 1

## 2014-03-27 MED ORDER — MONTELUKAST SODIUM 10 MG PO TABS
10.0000 mg | ORAL_TABLET | Freq: Every day | ORAL | Status: DC
  Administered 2014-03-27 – 2014-03-28 (×2): 10 mg via ORAL
  Filled 2014-03-27 (×3): qty 1

## 2014-03-27 MED ORDER — BRIMONIDINE TARTRATE-TIMOLOL 0.2-0.5 % OP SOLN: 1.0000 [drp] | Freq: Two times a day (BID) | OPHTHALMIC | Status: DC

## 2014-03-27 MED ORDER — MOMETASONE FURO-FORMOTEROL FUM 200-5 MCG/ACT IN AERO
2.0000 | INHALATION_SPRAY | Freq: Two times a day (BID) | RESPIRATORY_TRACT | Status: DC
  Administered 2014-03-28: 2 via RESPIRATORY_TRACT
  Filled 2014-03-27: qty 8.8

## 2014-03-27 MED ORDER — INSULIN ASPART 100 UNIT/ML ~~LOC~~ SOLN
0.0000 [IU] | Freq: Three times a day (TID) | SUBCUTANEOUS | Status: DC
  Administered 2014-03-27 – 2014-03-28 (×2): 15 [IU] via SUBCUTANEOUS
  Filled 2014-03-27 (×2): qty 1

## 2014-03-27 MED ORDER — INSULIN GLARGINE 100 UNIT/ML ~~LOC~~ SOLN
10.0000 [IU] | Freq: Every day | SUBCUTANEOUS | Status: DC
  Administered 2014-03-27 – 2014-03-28 (×2): 10 [IU] via SUBCUTANEOUS
  Filled 2014-03-27 (×3): qty 0.1

## 2014-03-27 MED ORDER — TIMOLOL MALEATE 0.5 % OP SOLN
1.0000 [drp] | Freq: Two times a day (BID) | OPHTHALMIC | Status: DC
  Administered 2014-03-27 – 2014-03-29 (×4): 1 [drp] via OPHTHALMIC
  Filled 2014-03-27: qty 5

## 2014-03-27 NOTE — ED Notes (Signed)
Cristina Billings, RN from [facility] called and advised that policy states that pt CBG must be 350 or less for 24 hours before she can be tx over to [facility].

## 2014-03-27 NOTE — ED Notes (Signed)
Per Dr. Mingo Amber, pt can move to psych ED until [facility] is stable to go to [facility].

## 2014-03-27 NOTE — ED Notes (Signed)
Pt voided on self when vomiting on arrival pt will void when able to

## 2014-03-27 NOTE — ED Notes (Signed)
Pt has one bag of belongings placed in locker 27
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
## 2014-03-27 NOTE — ED Notes (Signed)
Per pt's mother pt ingested unknown amount (pt's thinks 5-8) Trazodone around 9am this morning.  Pt is diabetic and has insulin pump that is currently on pt.

## 2014-03-27 NOTE — ED Notes (Signed)
Pt has black t shirt black pants black sandals  Multicolor tank top purple underwear white colored cross necklace white colord ring with cross with white colored stones and pink insulin pump(given to [employer])

## 2014-03-27 NOTE — Progress Notes (Signed)
Patient accepted to [facility] 300-1.  TTS counselor will complete support paperwork.

## 2014-03-27 NOTE — BH Assessment (Signed)
[place] Assessment Progress Note Pt accepted by Waylan Boga, NP to bed 300-1, Dr. Sabra Heck, but will program on the 500 hall.  Pt completed voluntary paperwork, and nurse will call report to adult unit.  Pt will be transferred when appropriate per adult unit and CBG stabilizes.

## 2014-03-27 NOTE — ED Provider Notes (Signed)
CSN: [id number]     Arrival date & time 03/27/14  1254 History   First MD Initiated Contact with Patient 03/27/14 1302     Chief Complaint  Patient presents with  . Drug Overdose     (Consider location/radiation/quality/duration/timing/severity/associated sxs/prior Treatment) HPI  28 year old female with an intentional drug overdose of trazodone. Patient took approximately 6-8 tablets of trazodone around 9:00 this morning. She is unsure of dosage. She was intentionally tried to kill herself. She cites marital stress. Is on multiple other medications, but she denies any other ingestion. Nausea and did vomit shortly before my evaluation. This is attributed to her. Feels tired. No homicidal ideation. She has a psychiatric history and has tried hurting herself before.  Past Medical History  Diagnosis Date  . Asthma   . Tachycardia     baseline tachycardia   . Retinopathy due to secondary diabetes   . Diabetic gastroparesis   . 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 Diabetic neuropathy, type I diabetes mellitus   . Polyneuropathy in diabetes(357.2)   . Anxiety   . Hypertension   . Depression   . Asthma 11/10/2013  . Headache(784.0)   . Kidney stones    Past Surgical History  Procedure Laterality Date  . Cholecystectomy    . Cesarean section    . Refractive surgery     Family History  Problem Relation Age of Onset  . Hypertension     History  Substance Use Topics  . Smoking status: Never Smoker   . Smokeless tobacco: Never Used  . Alcohol Use: No   OB History   Grav Para Term Preterm Abortions TAB SAB Ect Mult Living   1 1        1      Review of Systems  All systems reviewed and negative, other than as noted in HPI.   Allergies  Ceftin  Home Medications   Prior to Admission medications   Medication Sig Start Date End Date Taking? Authorizing Provider  albuterol (PROVENTIL HFA;VENTOLIN HFA) 108 (90 BASE) MCG/ACT inhaler Inhale  2 puffs into the lungs every 6 (six) hours as needed. As needed for shortness of breath. 01/03/13   [name], MD  brimonidine-timolol (COMBIGAN) 0.2-0.5 % ophthalmic solution Place 1 drop into both eyes every 12 (twelve) hours.    Historical Provider, MD  clonazePAM (KLONOPIN) 0.5 MG tablet Take 1 tablet (0.5 mg total) by mouth 2 (two) times daily as needed (anxiety). 03/27/14   Lonie Peak Dixon, PA-C  DULoxetine (CYMBALTA) 60 MG capsule Take 60 mg by mouth daily.    Historical Provider, MD  fluticasone-salmeterol (ADVAIR HFA) 230-21 MCG/ACT inhaler Inhale 1 puff into the lungs 2 (two) times daily. 11/10/13   Lonie Peak Dixon, PA-C  insulin glargine (LANTUS) 100 UNIT/ML injection Inject 0.3 mLs (30 Units total) into the skin daily. 10/17/13   Waylan Boga, NP  insulin lispro (HUMALOG) 100 UNIT/ML injection Inject into the skin. Has Insulin Pump    Historical Provider, MD  Levonorgestrel-Ethinyl Estradiol (AMETHIA,CAMRESE) 0.15-0.03 &0.01 MG tablet Take 1 tablet by mouth daily. 03/26/14   Lahoma Crocker, MD  lisinopril (PRINIVIL,ZESTRIL) 10 MG tablet Take 1 tablet (10 mg total) by mouth every morning. 10/17/13   Waylan Boga, NP  metoprolol tartrate (LOPRESSOR) 25 MG tablet Take 1 tablet (25 mg total) by mouth as needed (for tachycardia lasting more than 20 minutes). 10/17/13   Waylan Boga, NP  montelukast (SINGULAIR) 10 MG tablet TAKE  1 TABLET BY MOUTH EVERY NIGHT AT BEDTIME 11/11/13   [name], MD  Prenatal Vit-Fe Fumarate-FA (PRENATAL MULTIVITAMIN) TABS tablet Take 1 tablet by mouth daily at 12 noon.    Historical Provider, MD  sulfamethoxazole-trimethoprim (BACTRIM DS) 800-160 MG per tablet Take 1 tablet by mouth 2 (two) times daily. 12/26/13   [name], MD  terbinafine (LAMISIL) 250 MG tablet Take 1 tablet (250 mg total) by mouth daily. 12/18/13   Lonie Peak Dixon, PA-C  triamcinolone cream (KENALOG) 0.1 % Apply 1 application topically 2 (two) times daily. 12/18/13   Lonie Peak Dixon, PA-C   BP  107/68  Pulse 75  Temp(Src) 98 F (36.7 C) (Oral)  Resp 14  SpO2 93%  LMP 03/13/2014 Physical Exam  Nursing note and vitals reviewed. Constitutional: She appears well-developed and well-nourished. No distress.  Laying in bed. Drowsy, but not toxic. Awakens to voice.  HENT:  Head: Normocephalic and atraumatic.  Eyes: Conjunctivae are normal. Right eye exhibits no discharge. Left eye exhibits no discharge.  Neck: Neck supple.  Cardiovascular: Normal rate, regular rhythm and normal heart sounds.  Exam reveals no gallop and no friction rub.   No murmur heard. Pulmonary/Chest: Effort normal and breath sounds normal. No respiratory distress.  Abdominal: Soft. She exhibits no distension. There is no tenderness.  Musculoskeletal: She exhibits no edema and no tenderness.  Neurological: She is alert.  Skin: Skin is warm and dry. She is not diaphoretic.  Psychiatric:  Drowsy. Awakens to conversational voice. Her speech is clear. Content is appropriate. Withdrawn versus just being drowsy from her ingestion. Does not appear to be responding to internal stimuli.     ED Course  Procedures (including critical care time) Labs Review Labs Reviewed  COMPREHENSIVE METABOLIC PANEL - Abnormal; Notable for the following:    Sodium 134 (*)    Glucose, Bld 431 (*)    Albumin 3.3 (*)    Alkaline Phosphatase 129 (*)    All other components within normal limits  SALICYLATE LEVEL - Abnormal; Notable for the following:    Salicylate Lvl <3.7 (*)    All other components within normal limits  CBG MONITORING, ED - Abnormal; Notable for the following:    Glucose-Capillary 396 (*)    All other components within normal limits  CBC  ETHANOL  ACETAMINOPHEN LEVEL  URINE RAPID DRUG SCREEN (HOSP PERFORMED)  POC URINE PREG, ED    Imaging Review No results found.   EKG Interpretation   Date/Time:  Friday March 27 2014 13:12:00 EDT Ventricular Rate:  86 PR Interval:  113 QRS Duration: 77 QT Interval:   372 QTC Calculation: 445 R Axis:   68 Text Interpretation:  Sinus rhythm Borderline short PR interval Confirmed  by Wilson Singer  MD, STEPHEN ([id number]) on 03/27/2014 1:51:04 PM Also confirmed by  Wilson Singer  MD, Addison ([id number]), editor [place]  CCT, BEVERLY ([id number])  on  03/27/2014 2:25:42 PM      MDM   Final diagnoses:  Overdose, intentional self-harm, initial encounter  Hyperglycemia  Depression    28 year old female with intentional drug overdose with intent for self-harm. Denies any other coingestion. Her insulin pump was disconnected on arrival as precuations. Drowsy, otherwise neurologically intact. She is hyperglycemic and mildly hypotensive on arrival. This will be addressed. Additional screening studies. Psychiatric evaluation once medically cleared.  3:30 PM No new complaints. Some persistent hypotension. Additional IVF ordered. Pt is sitting up and talking though. Ate a sandwich. Talking with mother and daughter at  the bedside.     Virgel Manifold, MD 04/01/14 2030

## 2014-03-27 NOTE — ED Notes (Signed)
Pt insulin pump placed into locker with pt's belongings, #27

## 2014-03-27 NOTE — ED Notes (Signed)
Pt states that she took approx 8 Trazadone in an attempt to harm herself because her husband told her it was over. Pt states that she has attempted suicide in the past and has been hospitalized at [facility]. Pt is A&O and in NAD. Pt is on monitor and vss.

## 2014-03-27 NOTE — Telephone Encounter (Signed)
Rx called in 

## 2014-03-27 NOTE — ED Notes (Addendum)
TTS team reports that pt has bed at [place] [facility], bed 1. Pt must be medically cleared before tx. Pt CBG 383 at this time. [facility] to look at their policy and call back with information.

## 2014-03-27 NOTE — ED Notes (Signed)
pts mother at bedside.

## 2014-03-27 NOTE — ED Notes (Signed)
Attempted to call report-RN's were in shift change report and unable to take at this time.

## 2014-03-27 NOTE — ED Notes (Signed)
When pt vomited earlier, pt urinated on bed. Pt was placed into new gown and all bedding changed. Pt back in bed and warm blanket provided.

## 2014-03-27 NOTE — ED Notes (Signed)
Confirmed that Dr. Wilson Singer wants insulin IV versus sub q. Dr. Wilson Singer confirmed that order is correct and he does want IV admin of insulin

## 2014-03-27 NOTE — ED Notes (Signed)
Pt given turkey sandwich and diet ginger ale.   

## 2014-03-28 LAB — CBG MONITORING, ED
Glucose-Capillary: 199 mg/dL — ABNORMAL HIGH (ref 70–99)
Glucose-Capillary: 254 mg/dL — ABNORMAL HIGH (ref 70–99)
Glucose-Capillary: 289 mg/dL — ABNORMAL HIGH (ref 70–99)
Glucose-Capillary: 330 mg/dL — ABNORMAL HIGH (ref 70–99)
Glucose-Capillary: 436 mg/dL — ABNORMAL HIGH (ref 70–99)
Glucose-Capillary: 504 mg/dL — ABNORMAL HIGH (ref 70–99)
Glucose-Capillary: 544 mg/dL — ABNORMAL HIGH (ref 70–99)

## 2014-03-28 MED ORDER — ARIPIPRAZOLE 10 MG PO TABS
10.0000 mg | ORAL_TABLET | Freq: Every day | ORAL | Status: DC
  Administered 2014-03-28 – 2014-03-29 (×2): 10 mg via ORAL
  Filled 2014-03-28 (×2): qty 1

## 2014-03-28 MED ORDER — INSULIN ASPART 100 UNIT/ML ~~LOC~~ SOLN
0.0000 [IU] | SUBCUTANEOUS | Status: DC
  Administered 2014-03-28: 15 [IU] via SUBCUTANEOUS
  Administered 2014-03-28 (×2): 11 [IU] via SUBCUTANEOUS
  Administered 2014-03-29: 4 [IU] via SUBCUTANEOUS
  Administered 2014-03-29: 11 [IU] via SUBCUTANEOUS
  Administered 2014-03-29: 7 [IU] via SUBCUTANEOUS
  Administered 2014-03-29: 4 [IU] via SUBCUTANEOUS
  Filled 2014-03-28 (×7): qty 1

## 2014-03-28 NOTE — Consult Note (Signed)
Psychiatry Follow Up Consult   Assessment: AXIS I:  Generalized Anxiety Disorder and Major Depression, Recurrent severe AXIS II:  Deferred AXIS III:   Past Medical History  Diagnosis Date  . Asthma   . Tachycardia     baseline tachycardia   . Retinopathy due to secondary diabetes   . Diabetic gastroparesis   . 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 Diabetic neuropathy, type I diabetes mellitus   . Polyneuropathy in diabetes(357.2)   . Anxiety   . Hypertension   . Depression   . Asthma 11/10/2013  . Headache(784.0)   . Kidney stones    AXIS IV:  other psychosocial or environmental problems, problems related to social environment and problems with primary support group AXIS V:  21-30 behavior considerably influenced by delusions or hallucinations OR serious impairment in judgment, communication OR inability to function in almost all areas  Plan:  Recommend psychiatric Inpatient admission when medically cleared.  Subjective:   [name] is a 28 y.o. female patient admitted with depression and intentional overdose.  Patient seen chart reviewed.  Patient remains very depressed anxious and continued to endorse suicidal thoughts.  Patient known to this Probation officer from consultation liaison services in January 2015 .  She was recommended inpatient psychiatric services.  Upon discharge she did not followup with this Probation officer .  She mentioned that [facility] but she did not continue her medication.  She was diagnosed with bipolar disorder.  She endorsed that she got married the day of discharge.  She endorsed continued to have arguments and recently she is separated from her husband.  She endorsed her depression has been worse.  She admitted irritability, anger, mood swing.  Currently she is staying with a friend .  She was offered inpatient psychiatric services which he accepted.  Past Psychiatric History: Past Medical  History  Diagnosis Date  . Asthma   . Tachycardia     baseline tachycardia   . Retinopathy due to secondary diabetes   . Diabetic gastroparesis   . 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 Diabetic neuropathy, type I diabetes mellitus   . Polyneuropathy in diabetes(357.2)   . Anxiety   . Hypertension   . Depression   . Asthma 11/10/2013  . Headache(784.0)   . Kidney stones     reports that she has never smoked. She has never used smokeless tobacco. She reports that she does not drink alcohol or use illicit drugs. Family History  Problem Relation Age of Onset  . Hypertension             Allergies:   Allergies  Allergen Reactions  . Ceftin Rash    ACT Assessment Complete:  Yes:    Educational Status    Risk to Self: Risk to self Is patient at risk for suicide?: Yes Substance abuse history and/or treatment for substance abuse?: No  Risk to Others:    Abuse:    Prior Inpatient Therapy:    Prior Outpatient Therapy:    Additional Information:      Objective: Blood pressure 118/73, pulse 104, temperature 99.3 F (37.4 C), temperature source Oral, resp. rate 16, last menstrual period 03/13/2014, SpO2 96.00%.There is no weight on file to calculate BMI. Results for orders placed during the hospital encounter of 03/27/14 (from the past 72 hour(s))  CBG MONITORING, ED     Status: Abnormal   Collection  Time    03/27/14  1:15 PM      Result Value Ref Range   Glucose-Capillary 396 (*) 70 - 99 mg/dL   Comment 1 Documented in Chart     Comment 2 Notify RN    CBC     Status: None   Collection Time    03/27/14  1:28 PM      Result Value Ref Range   WBC 9.8  4.0 - 10.5 K/uL   RBC 4.79  3.87 - 5.11 MIL/uL   Hemoglobin 13.8  12.0 - 15.0 g/dL   HCT 39.4  36.0 - 46.0 %   MCV 82.3  78.0 - 100.0 fL   MCH 28.8  26.0 - 34.0 pg   MCHC 35.0  30.0 - 36.0 g/dL   RDW 13.0  11.5 - 15.5 %   Platelets 305  150 - 400 K/uL  COMPREHENSIVE METABOLIC PANEL      Status: Abnormal   Collection Time    03/27/14  1:28 PM      Result Value Ref Range   Sodium 134 (*) 137 - 147 mEq/L   Potassium 5.2  3.7 - 5.3 mEq/L   Chloride 96  96 - 112 mEq/L   CO2 23  19 - 32 mEq/L   Glucose, Bld 431 (*) 70 - 99 mg/dL   BUN 14  6 - 23 mg/dL   Creatinine, Ser 0.83  0.50 - 1.10 mg/dL   Calcium 9.3  8.4 - 10.5 mg/dL   Total Protein 7.0  6.0 - 8.3 g/dL   Albumin 3.3 (*) 3.5 - 5.2 g/dL   AST 17  0 - 37 U/L   Comment: SLIGHT HEMOLYSIS     HEMOLYSIS AT THIS LEVEL MAY AFFECT RESULT   ALT 9  0 - 35 U/L   Alkaline Phosphatase 129 (*) 39 - 117 U/L   Total Bilirubin 0.3  0.3 - 1.2 mg/dL   GFR calc non Af Amer >90  >90 mL/min   GFR calc Af Amer >90  >90 mL/min   Comment: (NOTE)     The eGFR has been calculated using the CKD EPI equation.     This calculation has not been validated in all clinical situations.     eGFR's persistently <90 mL/min signify possible Chronic Kidney     Disease.  ETHANOL     Status: None   Collection Time    03/27/14  1:28 PM      Result Value Ref Range   Alcohol, Ethyl (B) <11  0 - 11 mg/dL   Comment:            LOWEST DETECTABLE LIMIT FOR     SERUM ALCOHOL IS 11 mg/dL     FOR MEDICAL PURPOSES ONLY  ACETAMINOPHEN LEVEL     Status: None   Collection Time    03/27/14  1:28 PM      Result Value Ref Range   Acetaminophen (Tylenol), Serum <15.0  10 - 30 ug/mL   Comment:            THERAPEUTIC CONCENTRATIONS VARY     SIGNIFICANTLY. A RANGE OF 10-30     ug/mL MAY BE AN EFFECTIVE     CONCENTRATION FOR MANY PATIENTS.     HOWEVER, SOME ARE BEST TREATED     AT CONCENTRATIONS OUTSIDE THIS     RANGE.     ACETAMINOPHEN CONCENTRATIONS     >150 ug/mL AT 4 HOURS AFTER     INGESTION  AND >50 ug/mL AT 12     HOURS AFTER INGESTION ARE     OFTEN ASSOCIATED WITH TOXIC     REACTIONS.  SALICYLATE LEVEL     Status: Abnormal   Collection Time    03/27/14  1:28 PM      Result Value Ref Range   Salicylate Lvl <6.6 (*) 2.8 - 20.0 mg/dL  URINE RAPID  DRUG SCREEN (HOSP PERFORMED)     Status: Abnormal   Collection Time    03/27/14  3:17 PM      Result Value Ref Range   Opiates NONE DETECTED  NONE DETECTED   Cocaine NONE DETECTED  NONE DETECTED   Benzodiazepines POSITIVE (*) NONE DETECTED   Amphetamines NONE DETECTED  NONE DETECTED   Tetrahydrocannabinol NONE DETECTED  NONE DETECTED   Barbiturates NONE DETECTED  NONE DETECTED   Comment:            DRUG SCREEN FOR MEDICAL PURPOSES     ONLY.  IF CONFIRMATION IS NEEDED     FOR ANY PURPOSE, NOTIFY LAB     WITHIN 5 DAYS.                LOWEST DETECTABLE LIMITS     FOR URINE DRUG SCREEN     Drug Class       Cutoff (ng/mL)     Amphetamine      1000     Barbiturate      200     Benzodiazepine   440     Tricyclics       347     Opiates          300     Cocaine          300     THC              50  POC URINE PREG, ED     Status: None   Collection Time    03/27/14  3:19 PM      Result Value Ref Range   Preg Test, Ur NEGATIVE  NEGATIVE   Comment:            THE SENSITIVITY OF THIS     METHODOLOGY IS >24 mIU/mL  CBG MONITORING, ED     Status: Abnormal   Collection Time    03/27/14  4:50 PM      Result Value Ref Range   Glucose-Capillary 383 (*) 70 - 99 mg/dL  CBG MONITORING, ED     Status: Abnormal   Collection Time    03/27/14  9:32 PM      Result Value Ref Range   Glucose-Capillary 364 (*) 70 - 99 mg/dL  CBG MONITORING, ED     Status: Abnormal   Collection Time    03/28/14  7:54 AM      Result Value Ref Range   Glucose-Capillary 504 (*) 70 - 99 mg/dL   Comment 1 Notify RN    CBG MONITORING, ED     Status: Abnormal   Collection Time    03/28/14  9:16 AM      Result Value Ref Range   Glucose-Capillary 544 (*) 70 - 99 mg/dL   Comment 1 Notify RN    CBG MONITORING, ED     Status: Abnormal   Collection Time    03/28/14 10:31 AM      Result Value Ref Range   Glucose-Capillary 436 (*) 70 - 99 mg/dL   Labs  are reviewed and are pertinent for no medical issues  noted.  Current Facility-Administered Medications  Medication Dose Route Frequency Provider Last Rate Last Dose  . acetaminophen (TYLENOL) tablet 650 mg  650 mg Oral Q4H PRN Virgel Manifold, MD      . albuterol (PROVENTIL HFA;VENTOLIN HFA) 108 (90 BASE) MCG/ACT inhaler 2 puff  2 puff Inhalation Q6H PRN Virgel Manifold, MD      . alum & mag hydroxide-simeth (MAALOX/MYLANTA) 200-200-20 MG/5ML suspension 30 mL  30 mL Oral PRN Virgel Manifold, MD      . brimonidine (ALPHAGAN) 0.2 % ophthalmic solution 1 drop  1 drop Both Eyes q12n4p Virgel Manifold, MD   1 drop at 03/27/14 1652  . DULoxetine (CYMBALTA) DR capsule 60 mg  60 mg Oral Daily Virgel Manifold, MD   60 mg at 03/28/14 1044  . ibuprofen (ADVIL,MOTRIN) tablet 600 mg  600 mg Oral Q8H PRN Virgel Manifold, MD   600 mg at 03/28/14 0350  . insulin aspart (novoLOG) injection 0-20 Units  0-20 Units Subcutaneous 6 times per day Houston Siren III, MD      . insulin glargine (LANTUS) injection 10 Units  10 Units Subcutaneous QHS Osvaldo Shipper, MD   10 Units at 03/27/14 2221  . insulin glargine (LANTUS) injection 30 Units  30 Units Subcutaneous Daily Virgel Manifold, MD   30 Units at 03/28/14 [phone]  . lisinopril (PRINIVIL,ZESTRIL) tablet 10 mg  10 mg Oral q morning - 10a Virgel Manifold, MD   10 mg at 03/28/14 1044  . mometasone-formoterol (DULERA) 200-5 MCG/ACT inhaler 2 puff  2 puff Inhalation BID Virgel Manifold, MD      . montelukast (SINGULAIR) tablet 10 mg  10 mg Oral QHS Virgel Manifold, MD   10 mg at 03/27/14 2221  . ondansetron (ZOFRAN) tablet 4 mg  4 mg Oral Q8H PRN Virgel Manifold, MD      . terbinafine (LAMISIL) tablet 250 mg  250 mg Oral Daily Virgel Manifold, MD   250 mg at 03/28/14 1044  . timolol (TIMOPTIC) 0.5 % ophthalmic solution 1 drop  1 drop Both Eyes Q12H Virgel Manifold, MD   1 drop at 03/28/14 1045  . traZODone (DESYREL) tablet 50 mg  50 mg Oral QHS PRN Lurena Nida, NP       Current Outpatient Prescriptions  Medication Sig Dispense Refill   . albuterol (PROVENTIL HFA;VENTOLIN HFA) 108 (90 BASE) MCG/ACT inhaler Inhale 2 puffs into the lungs every 6 (six) hours as needed. As needed for shortness of breath.  1 Inhaler  11  . brimonidine-timolol (COMBIGAN) 0.2-0.5 % ophthalmic solution Place 1 drop into both eyes every 12 (twelve) hours.      . clonazePAM (KLONOPIN) 0.5 MG tablet Take 1 tablet (0.5 mg total) by mouth 2 (two) times daily as needed (anxiety).  30 tablet  0  . DULoxetine (CYMBALTA) 60 MG capsule Take 60 mg by mouth daily.      . fluticasone-salmeterol (ADVAIR HFA) 230-21 MCG/ACT inhaler Inhale 1 puff into the lungs 2 (two) times daily.  1 Inhaler  12  . insulin glargine (LANTUS) 100 UNIT/ML injection Inject 0.3 mLs (30 Units total) into the skin daily.  10 mL  0  . Insulin Human (INSULIN PUMP) SOLN Inject into the skin continuous. HUMALOG.      . insulin lispro (HUMALOG) 100 UNIT/ML injection Inject 1-25 Units into the skin 3 (three) times daily before meals.      Marland Kitchen lisinopril (PRINIVIL,ZESTRIL) 10 MG  tablet Take 1 tablet (10 mg total) by mouth every morning.      . metoprolol tartrate (LOPRESSOR) 25 MG tablet Take 1 tablet (25 mg total) by mouth as needed (for tachycardia lasting more than 20 minutes).      . montelukast (SINGULAIR) 10 MG tablet Take 10 mg by mouth at bedtime.      . terbinafine (LAMISIL) 250 MG tablet Take 1 tablet (250 mg total) by mouth daily.  30 tablet  2  . TRAZODONE HCL PO Take by mouth.        Psychiatric Specialty Exam:     Blood pressure 118/73, pulse 104, temperature 99.3 F (37.4 C), temperature source Oral, resp. rate 16, last menstrual period 03/13/2014, SpO2 96.00%.There is no weight on file to calculate BMI.  General Appearance: Disheveled  Eye Sport and exercise psychologist::  Fair  Speech:  Slow  Volume:  Decreased  Mood:  Depressed  Affect:  Congruent  Thought Process:  slowed  Orientation:  Full (Time, Place, and Person)  Thought Content:  Rumination  Suicidal Thoughts:  Yes.  with intent/plan   Homicidal Thoughts:  No  Memory:  Immediate;   Good Recent;   Good Remote;   Good  Judgement:  Poor  Insight:  Lacking  Psychomotor Activity:  Decreased  Concentration:  Fair  Recall:  [place] of Knowledge:Fair  Language: Good  Akathisia:  No  Handed:  Right  AIMS (if indicated):     Assets:  Communication Skills Desire for Improvement Financial Resources/Insurance [place] Talents/Skills Transportation Vocational/Educational  Sleep:      Musculoskeletal: Strength & Muscle Tone: within normal limits Gait & Station: normal Patient leans: N/A  Treatment Plan Summary: Increase Abilify 5 mg daily.  Continue Cymbalta at present dose.  Patient will be admitted to inpatient psychiatric services for stabilization and treatment.  ARFEEN,SYED T. 03/28/2014 11:28 AM

## 2014-03-28 NOTE — ED Notes (Signed)
Notified Dr. Doy Mince of CBG 504. Stated he would look at her chart and return my call.

## 2014-03-28 NOTE — ED Notes (Signed)
Spoke with Dr Doy Mince about CBG 544 after insulin given. Wants to see what orders pharmacy puts in after talking with her.

## 2014-03-28 NOTE — ED Notes (Signed)
Patient given Unhealthy Thought Patterns and Suicide Prevention Plan sheets as part of [employer].

## 2014-03-28 NOTE — ED Notes (Signed)
Per Dr Hubbard Robinson 15 units of SSI and go ahead and give 10:00 dose of Lantus 30 units. Request pharmacy tech to talk with patient about home insulin used when not on insulin pump. Per Lyda Jester at [facility] insulin pump allowed at [facility].

## 2014-03-29 ENCOUNTER — Observation Stay (HOSPITAL_COMMUNITY)
Admission: AD | Admit: 2014-03-29 | Discharge: 2014-03-30 | Disposition: A | Discharge status: Home / Self Care | Payer: 59 | Source: Intra-hospital | Attending: Psychiatry | Admitting: Psychiatry

## 2014-03-29 ENCOUNTER — Encounter (HOSPITAL_COMMUNITY): Payer: Self-pay | Admitting: *Deleted

## 2014-03-29 DIAGNOSIS — F332 Major depressive disorder, recurrent severe without psychotic features: Secondary | ICD-10-CM

## 2014-03-29 DIAGNOSIS — Z91199 Patient's noncompliance with other medical treatment and regimen due to unspecified reason: Secondary | ICD-10-CM | POA: Insufficient documentation

## 2014-03-29 DIAGNOSIS — K3184 Gastroparesis: Secondary | ICD-10-CM | POA: Insufficient documentation

## 2014-03-29 DIAGNOSIS — E1142 Type 2 diabetes mellitus with diabetic polyneuropathy: Secondary | ICD-10-CM | POA: Insufficient documentation

## 2014-03-29 DIAGNOSIS — B351 Tinea unguium: Secondary | ICD-10-CM

## 2014-03-29 DIAGNOSIS — I10 Essential (primary) hypertension: Secondary | ICD-10-CM | POA: Insufficient documentation

## 2014-03-29 DIAGNOSIS — E1039 Type 1 diabetes mellitus with other diabetic ophthalmic complication: Secondary | ICD-10-CM | POA: Insufficient documentation

## 2014-03-29 DIAGNOSIS — F411 Generalized anxiety disorder: Secondary | ICD-10-CM | POA: Insufficient documentation

## 2014-03-29 DIAGNOSIS — Z9119 Patient's noncompliance with other medical treatment and regimen: Secondary | ICD-10-CM | POA: Insufficient documentation

## 2014-03-29 DIAGNOSIS — E11329 Type 2 diabetes mellitus with mild nonproliferative diabetic retinopathy without macular edema: Secondary | ICD-10-CM | POA: Insufficient documentation

## 2014-03-29 DIAGNOSIS — E1049 Type 1 diabetes mellitus with other diabetic neurological complication: Secondary | ICD-10-CM | POA: Insufficient documentation

## 2014-03-29 DIAGNOSIS — F329 Major depressive disorder, single episode, unspecified: Secondary | ICD-10-CM | POA: Diagnosis present

## 2014-03-29 DIAGNOSIS — T50902A Poisoning by unspecified drugs, medicaments and biological substances, intentional self-harm, initial encounter: Secondary | ICD-10-CM

## 2014-03-29 DIAGNOSIS — J45909 Unspecified asthma, uncomplicated: Secondary | ICD-10-CM | POA: Insufficient documentation

## 2014-03-29 LAB — CBG MONITORING, ED
Glucose-Capillary: 239 mg/dL — ABNORMAL HIGH (ref 70–99)
Glucose-Capillary: 155 mg/dL — ABNORMAL HIGH (ref 70–99)
Glucose-Capillary: 270 mg/dL — ABNORMAL HIGH (ref 70–99)
Glucose-Capillary: 262 mg/dL — ABNORMAL HIGH (ref 70–99)

## 2014-03-29 LAB — GLUCOSE, CAPILLARY: GLUCOSE-CAPILLARY: 272 mg/dL — AB (ref 70–99)

## 2014-03-29 MED ORDER — DOCUSATE SODIUM 100 MG PO CAPS
100.0000 mg | ORAL_CAPSULE | Freq: Two times a day (BID) | ORAL | Status: DC
Start: 2014-03-29 — End: 2014-03-29
  Administered 2014-03-29: 100 mg via ORAL
  Filled 2014-03-29 (×2): qty 1

## 2014-03-29 MED ORDER — ONDANSETRON HCL 4 MG PO TABS: 4.0000 mg | ORAL_TABLET | Freq: Three times a day (TID) | ORAL | Status: DC | PRN

## 2014-03-29 MED ORDER — ARIPIPRAZOLE 10 MG PO TABS
10.0000 mg | ORAL_TABLET | Freq: Every day | ORAL | Status: DC
  Administered 2014-03-30: 10 mg via ORAL
  Filled 2014-03-29 (×3): qty 1

## 2014-03-29 MED ORDER — DULOXETINE HCL 60 MG PO CPEP
60.0000 mg | ORAL_CAPSULE | ORAL | Status: DC
  Administered 2014-03-30: 60 mg via ORAL
  Filled 2014-03-29 (×3): qty 1

## 2014-03-29 MED ORDER — ACETAMINOPHEN 325 MG PO TABS
650.0000 mg | ORAL_TABLET | Freq: Four times a day (QID) | ORAL | Status: DC | PRN
  Administered 2014-03-29: 650 mg via ORAL
  Filled 2014-03-29: qty 2

## 2014-03-29 MED ORDER — ALBUTEROL SULFATE HFA 108 (90 BASE) MCG/ACT IN AERS: 2.0000 | INHALATION_SPRAY | Freq: Four times a day (QID) | RESPIRATORY_TRACT | Status: DC | PRN

## 2014-03-29 MED ORDER — IBUPROFEN 600 MG PO TABS: 600.0000 mg | ORAL_TABLET | Freq: Three times a day (TID) | ORAL | Status: DC | PRN

## 2014-03-29 MED ORDER — TERBINAFINE HCL 250 MG PO TABS
250.0000 mg | ORAL_TABLET | ORAL | Status: DC
  Administered 2014-03-30: 250 mg via ORAL
  Filled 2014-03-29 (×3): qty 1

## 2014-03-29 MED ORDER — MOMETASONE FURO-FORMOTEROL FUM 200-5 MCG/ACT IN AERO
2.0000 | INHALATION_SPRAY | Freq: Two times a day (BID) | RESPIRATORY_TRACT | Status: DC
  Administered 2014-03-29 – 2014-03-30 (×2): 2 via RESPIRATORY_TRACT
  Filled 2014-03-29: qty 8.8

## 2014-03-29 MED ORDER — ACETAMINOPHEN 325 MG PO TABS: 650.0000 mg | ORAL_TABLET | ORAL | Status: DC | PRN

## 2014-03-29 MED ORDER — ALUM & MAG HYDROXIDE-SIMETH 200-200-20 MG/5ML PO SUSP: 30.0000 mL | ORAL | Status: DC | PRN

## 2014-03-29 MED ORDER — MAGNESIUM HYDROXIDE 400 MG/5ML PO SUSP: 30.0000 mL | Freq: Every day | ORAL | Status: DC | PRN

## 2014-03-29 MED ORDER — TIMOLOL MALEATE 0.5 % OP SOLN
1.0000 [drp] | Freq: Two times a day (BID) | OPHTHALMIC | Status: DC
  Administered 2014-03-29 – 2014-03-30 (×2): 1 [drp] via OPHTHALMIC
  Filled 2014-03-29: qty 5

## 2014-03-29 MED ORDER — INSULIN GLARGINE 100 UNIT/ML ~~LOC~~ SOLN
30.0000 [IU] | Freq: Every day | SUBCUTANEOUS | Status: DC
  Administered 2014-03-29 – 2014-03-30 (×2): 30 [IU] via SUBCUTANEOUS

## 2014-03-29 MED ORDER — DOCUSATE SODIUM 100 MG PO CAPS
100.0000 mg | ORAL_CAPSULE | Freq: Two times a day (BID) | ORAL | Status: DC
  Administered 2014-03-29 – 2014-03-30 (×2): 100 mg via ORAL
  Filled 2014-03-29 (×6): qty 1

## 2014-03-29 MED ORDER — INSULIN ASPART 100 UNIT/ML ~~LOC~~ SOLN
0.0000 [IU] | Freq: Three times a day (TID) | SUBCUTANEOUS | Status: DC
  Administered 2014-03-29: 8 [IU] via SUBCUTANEOUS
  Administered 2014-03-30: 3 [IU] via SUBCUTANEOUS

## 2014-03-29 MED ORDER — TRAZODONE HCL 50 MG PO TABS
50.0000 mg | ORAL_TABLET | Freq: Every evening | ORAL | Status: DC | PRN
  Administered 2014-03-29: 50 mg via ORAL
  Filled 2014-03-29: qty 1

## 2014-03-29 MED ORDER — MONTELUKAST SODIUM 10 MG PO TABS
10.0000 mg | ORAL_TABLET | Freq: Every day | ORAL | Status: DC
  Administered 2014-03-29: 10 mg via ORAL
  Filled 2014-03-29 (×3): qty 1

## 2014-03-29 MED ORDER — BRIMONIDINE TARTRATE 0.2 % OP SOLN
1.0000 [drp] | Freq: Two times a day (BID) | OPHTHALMIC | Status: DC
  Administered 2014-03-29 – 2014-03-30 (×2): 1 [drp] via OPHTHALMIC
  Filled 2014-03-29: qty 5

## 2014-03-29 MED ORDER — LISINOPRIL 10 MG PO TABS
10.0000 mg | ORAL_TABLET | Freq: Every morning | ORAL | Status: DC
  Administered 2014-03-30: 10 mg via ORAL
  Filled 2014-03-29 (×3): qty 1

## 2014-03-29 NOTE — ED Notes (Signed)
[facility] here to transport to [facility]. Belongings bag x1 given to driver. Denies pain. No complaints voiced. Ambulatory without difficulty.

## 2014-03-29 NOTE — Progress Notes (Signed)
Patient admitted to obs. From [facility] after OD on Trazadone. Now denies she is suicidal or homicidal.  Denies [facility].  Pleasant and cooperative no complaints.Oriented to unit. Nutrition offered. Education provided regarding safety and falls. Safety checks started every 15 minutes.

## 2014-03-29 NOTE — Plan of Care (Signed)
[place] Observation Crisis Plan  Reason for Crisis Plan:  Crisis Stabilization   Plan of Care:  Referral for Inpatient Hospitalization  Family Support:      Current Living Environment:  Living Arrangements: Alone  Insurance:   Hospital Account   Name Acct ID Class Status Primary Coverage   Zakaiya, Lares [id number] [place] [employer] [place] [employer]        Guarantor Account (for [place] [id number])   Name Relation to Pt Service Area Active? Acct Type   [name] Self [facility] Yes Behavioral Health   Address Phone       [place], [place] [address] [phone])          Coverage Information (for Hospital Account [id number])   1. [place] EMPLOYEE/[employer] [facility]   F/O Payor/Plan Precert #   [place] EMPLOYEE/[place] [employer]    Subscriber Subscriber #   Raziah, [place] [id number]   Address [place] [address] [phone]       2. [place]   F/O Payor/Plan Precert #   [facility] MEDICAID/[place]    Subscriber Subscriber #   Djuana, [place] [id number]   Address Phone   [address] [place], [place] [address] [phone]          Legal Guardian:     Primary Care Provider:  Odette Fraction, MD  Current Outpatient Providers:  unknown  Psychiatrist:     Counselor/Therapist:     Compliant with Medications:  Yes  Additional Information:   Debbrah Alar 6/21/20156:19 PM

## 2014-03-29 NOTE — ED Notes (Signed)
Patient has a visiting her father at this time. Patient appears happy; smiling while talking with her father. No acute distress noted.

## 2014-03-29 NOTE — Progress Notes (Signed)
Patient complained of headache of 7/10. Offered and accepted Acetaminophen 650 mg po prn for pain. Medication was effective. Rated pain 2/10.  At 11:30 pm, pt requested sleeping pill. Stated "I need something. I can't sleep" Trazodone 50 mg given per order. Pt. denied SI, AH/VH. Will continue to monitor patient.

## 2014-03-29 NOTE — ED Notes (Signed)
Report given to [facility] at [facility]. Will call [facility] to request transportation.

## 2014-03-29 NOTE — Consult Note (Signed)
Psychiatry Follow Up Consult   Assessment: AXIS I:  Generalized Anxiety Disorder and Major Depression, Recurrent severe AXIS II:  Deferred AXIS III:   Past Medical History  Diagnosis Date  . Asthma   . Tachycardia     baseline tachycardia   . Retinopathy due to secondary diabetes   . Diabetic gastroparesis   . 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 Diabetic neuropathy, type I diabetes mellitus   . Polyneuropathy in diabetes(357.2)   . Anxiety   . Hypertension   . Depression   . Asthma 11/10/2013  . Headache(784.0)   . Kidney stones    AXIS IV:  other psychosocial or environmental problems, problems related to social environment and problems with primary support group AXIS V:  21-30 behavior considerably influenced by delusions or hallucinations OR serious impairment in judgment, communication OR inability to function in almost all areas  Plan:  Recommend psychiatric Inpatient admission when medically cleared.  Subjective:   [name] is a 28 y.o. female patient admitted with depression and intentional overdose.  Patient seen chart reviewed.  Patient was started Abilify and dose was increased yesterday.  She is tolerating the medication better.  She appears with improved affect .  She is sleeping better.  However she remains very depressed and continued to endorse suicidal thoughts.  She admitted her mood remains very irritable and highs and lows.  Patient is waiting for inpatient bed.  Her blood glucose level remains fluctuated but it is improved from the past.   Past Psychiatric History: Past Medical History  Diagnosis Date  . Asthma   . Tachycardia     baseline tachycardia   . Retinopathy due to secondary diabetes   . Diabetic gastroparesis   . 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 Diabetic neuropathy, type I diabetes mellitus   . Polyneuropathy in diabetes(357.2)   . Anxiety   .  Hypertension   . Depression   . Asthma 11/10/2013  . Headache(784.0)   . Kidney stones     reports that she has never smoked. She has never used smokeless tobacco. She reports that she does not drink alcohol or use illicit drugs. Family History  Problem Relation Age of Onset  . Hypertension             Allergies:   Allergies  Allergen Reactions  . Ceftin Rash    ACT Assessment Complete:  Yes:    Educational Status    Risk to Self: Risk to self Is patient at risk for suicide?: Yes Substance abuse history and/or treatment for substance abuse?: No (UDS positive for benzos-but has prescription)  Risk to Others:    Abuse:    Prior Inpatient Therapy:    Prior Outpatient Therapy:    Additional Information:      Objective: Blood pressure 124/83, pulse 90, temperature 98.1 F (36.7 C), temperature source Oral, resp. rate 17, last menstrual period 03/13/2014, SpO2 98.00%.There is no weight on file to calculate BMI. Results for orders placed during the hospital encounter of 03/27/14 (from the past 72 hour(s))  CBG MONITORING, ED     Status: Abnormal   Collection Time    03/27/14  1:15 PM      Result Value Ref Range   Glucose-Capillary 396 (*) 70 - 99 mg/dL   Comment 1 Documented in Chart     Comment 2 Notify RN    CBC  Status: None   Collection Time    03/27/14  1:28 PM      Result Value Ref Range   WBC 9.8  4.0 - 10.5 K/uL   RBC 4.79  3.87 - 5.11 MIL/uL   Hemoglobin 13.8  12.0 - 15.0 g/dL   HCT 39.4  36.0 - 46.0 %   MCV 82.3  78.0 - 100.0 fL   MCH 28.8  26.0 - 34.0 pg   MCHC 35.0  30.0 - 36.0 g/dL   RDW 13.0  11.5 - 15.5 %   Platelets 305  150 - 400 K/uL  COMPREHENSIVE METABOLIC PANEL     Status: Abnormal   Collection Time    03/27/14  1:28 PM      Result Value Ref Range   Sodium 134 (*) 137 - 147 mEq/L   Potassium 5.2  3.7 - 5.3 mEq/L   Chloride 96  96 - 112 mEq/L   CO2 23  19 - 32 mEq/L   Glucose, Bld 431 (*) 70 - 99 mg/dL   BUN 14  6 - 23 mg/dL    Creatinine, Ser 0.83  0.50 - 1.10 mg/dL   Calcium 9.3  8.4 - 10.5 mg/dL   Total Protein 7.0  6.0 - 8.3 g/dL   Albumin 3.3 (*) 3.5 - 5.2 g/dL   AST 17  0 - 37 U/L   Comment: SLIGHT HEMOLYSIS     HEMOLYSIS AT THIS LEVEL MAY AFFECT RESULT   ALT 9  0 - 35 U/L   Alkaline Phosphatase 129 (*) 39 - 117 U/L   Total Bilirubin 0.3  0.3 - 1.2 mg/dL   GFR calc non Af Amer >90  >90 mL/min   GFR calc Af Amer >90  >90 mL/min   Comment: (NOTE)     The eGFR has been calculated using the CKD EPI equation.     This calculation has not been validated in all clinical situations.     eGFR's persistently <90 mL/min signify possible Chronic Kidney     Disease.  ETHANOL     Status: None   Collection Time    03/27/14  1:28 PM      Result Value Ref Range   Alcohol, Ethyl (B) <11  0 - 11 mg/dL   Comment:            LOWEST DETECTABLE LIMIT FOR     SERUM ALCOHOL IS 11 mg/dL     FOR MEDICAL PURPOSES ONLY  ACETAMINOPHEN LEVEL     Status: None   Collection Time    03/27/14  1:28 PM      Result Value Ref Range   Acetaminophen (Tylenol), Serum <15.0  10 - 30 ug/mL   Comment:            THERAPEUTIC CONCENTRATIONS VARY     SIGNIFICANTLY. A RANGE OF 10-30     ug/mL MAY BE AN EFFECTIVE     CONCENTRATION FOR MANY PATIENTS.     HOWEVER, SOME ARE BEST TREATED     AT CONCENTRATIONS OUTSIDE THIS     RANGE.     ACETAMINOPHEN CONCENTRATIONS     >150 ug/mL AT 4 HOURS AFTER     INGESTION AND >50 ug/mL AT 12     HOURS AFTER INGESTION ARE     OFTEN ASSOCIATED WITH TOXIC     REACTIONS.  SALICYLATE LEVEL     Status: Abnormal   Collection Time    03/27/14  1:28 PM  Result Value Ref Range   Salicylate Lvl <9.4 (*) 2.8 - 20.0 mg/dL  URINE RAPID DRUG SCREEN (HOSP PERFORMED)     Status: Abnormal   Collection Time    03/27/14  3:17 PM      Result Value Ref Range   Opiates NONE DETECTED  NONE DETECTED   Cocaine NONE DETECTED  NONE DETECTED   Benzodiazepines POSITIVE (*) NONE DETECTED   Amphetamines NONE DETECTED   NONE DETECTED   Tetrahydrocannabinol NONE DETECTED  NONE DETECTED   Barbiturates NONE DETECTED  NONE DETECTED   Comment:            DRUG SCREEN FOR MEDICAL PURPOSES     ONLY.  IF CONFIRMATION IS NEEDED     FOR ANY PURPOSE, NOTIFY LAB     WITHIN 5 DAYS.                LOWEST DETECTABLE LIMITS     FOR URINE DRUG SCREEN     Drug Class       Cutoff (ng/mL)     Amphetamine      1000     Barbiturate      200     Benzodiazepine   174     Tricyclics       081     Opiates          300     Cocaine          300     THC              50  POC URINE PREG, ED     Status: None   Collection Time    03/27/14  3:19 PM      Result Value Ref Range   Preg Test, Ur NEGATIVE  NEGATIVE   Comment:            THE SENSITIVITY OF THIS     METHODOLOGY IS >24 mIU/mL  CBG MONITORING, ED     Status: Abnormal   Collection Time    03/27/14  4:50 PM      Result Value Ref Range   Glucose-Capillary 383 (*) 70 - 99 mg/dL  CBG MONITORING, ED     Status: Abnormal   Collection Time    03/27/14  9:32 PM      Result Value Ref Range   Glucose-Capillary 364 (*) 70 - 99 mg/dL  CBG MONITORING, ED     Status: Abnormal   Collection Time    03/28/14  7:54 AM      Result Value Ref Range   Glucose-Capillary 504 (*) 70 - 99 mg/dL   Comment 1 Notify RN    CBG MONITORING, ED     Status: Abnormal   Collection Time    03/28/14  9:16 AM      Result Value Ref Range   Glucose-Capillary 544 (*) 70 - 99 mg/dL   Comment 1 Notify RN    CBG MONITORING, ED     Status: Abnormal   Collection Time    03/28/14 10:31 AM      Result Value Ref Range   Glucose-Capillary 436 (*) 70 - 99 mg/dL  CBG MONITORING, ED     Status: Abnormal   Collection Time    03/28/14 12:54 PM      Result Value Ref Range   Glucose-Capillary 330 (*) 70 - 99 mg/dL   Comment 1 Notify RN    CBG MONITORING, ED  Status: Abnormal   Collection Time    03/28/14  5:47 PM      Result Value Ref Range   Glucose-Capillary 254 (*) 70 - 99 mg/dL   Comment 1  Notify RN    CBG MONITORING, ED     Status: Abnormal   Collection Time    03/28/14  8:03 PM      Result Value Ref Range   Glucose-Capillary 289 (*) 70 - 99 mg/dL   Comment 1 Notify RN     Comment 2 Documented in Chart    CBG MONITORING, ED     Status: Abnormal   Collection Time    03/29/14 12:01 AM      Result Value Ref Range   Glucose-Capillary 199 (*) 70 - 99 mg/dL   Comment 1 Notify RN     Comment 2 Documented in Chart    CBG MONITORING, ED     Status: Abnormal   Collection Time    03/29/14  4:04 AM      Result Value Ref Range   Glucose-Capillary 239 (*) 70 - 99 mg/dL   Comment 1 Notify RN     Comment 2 Documented in Chart    CBG MONITORING, ED     Status: Abnormal   Collection Time    03/29/14  7:54 AM      Result Value Ref Range   Glucose-Capillary 155 (*) 70 - 99 mg/dL   Labs are reviewed and are pertinent for no medical issues noted.  Current Facility-Administered Medications  Medication Dose Route Frequency Provider Last Rate Last Dose  . acetaminophen (TYLENOL) tablet 650 mg  650 mg Oral Q4H PRN Raeford Razor, MD   650 mg at 03/29/14 1144  . albuterol (PROVENTIL HFA;VENTOLIN HFA) 108 (90 BASE) MCG/ACT inhaler 2 puff  2 puff Inhalation Q6H PRN Raeford Razor, MD      . alum & mag hydroxide-simeth (MAALOX/MYLANTA) 200-200-20 MG/5ML suspension 30 mL  30 mL Oral PRN Raeford Razor, MD      . ARIPiprazole (ABILIFY) tablet 10 mg  10 mg Oral Daily Cleotis Nipper, MD   10 mg at 03/29/14 1049  . brimonidine (ALPHAGAN) 0.2 % ophthalmic solution 1 drop  1 drop Both Eyes q12n4p Raeford Razor, MD   1 drop at 03/28/14 1653  . docusate sodium (COLACE) capsule 100 mg  100 mg Oral BID Shuvon Rankin, NP      . DULoxetine (CYMBALTA) DR capsule 60 mg  60 mg Oral Daily Raeford Razor, MD   60 mg at 03/29/14 1049  . ibuprofen (ADVIL,MOTRIN) tablet 600 mg  600 mg Oral Q8H PRN Raeford Razor, MD   600 mg at 03/28/14 0350  . insulin aspart (novoLOG) injection 0-20 Units  0-20 Units Subcutaneous 6  times per day Candyce Churn III, MD   4 Units at 03/29/14 [phone]  . insulin glargine (LANTUS) injection 10 Units  10 Units Subcutaneous QHS Dagmar Hait, MD   10 Units at 03/28/14 2219  . insulin glargine (LANTUS) injection 30 Units  30 Units Subcutaneous Daily Raeford Razor, MD   30 Units at 03/29/14 1050  . lisinopril (PRINIVIL,ZESTRIL) tablet 10 mg  10 mg Oral q morning - 10a Raeford Razor, MD   10 mg at 03/29/14 1049  . mometasone-formoterol (DULERA) 200-5 MCG/ACT inhaler 2 puff  2 puff Inhalation BID Raeford Razor, MD   2 puff at 03/28/14 2218  . montelukast (SINGULAIR) tablet 10 mg  10 mg Oral QHS Raeford Razor,  MD   10 mg at 03/28/14 2217  . ondansetron (ZOFRAN) tablet 4 mg  4 mg Oral Q8H PRN Virgel Manifold, MD      . terbinafine (LAMISIL) tablet 250 mg  250 mg Oral Daily Virgel Manifold, MD   250 mg at 03/29/14 1049  . timolol (TIMOPTIC) 0.5 % ophthalmic solution 1 drop  1 drop Both Eyes Q12H Virgel Manifold, MD   1 drop at 03/29/14 1049  . traZODone (DESYREL) tablet 50 mg  50 mg Oral QHS PRN Lurena Nida, NP   50 mg at 03/28/14 2217   Current Outpatient Prescriptions  Medication Sig Dispense Refill  . albuterol (PROVENTIL HFA;VENTOLIN HFA) 108 (90 BASE) MCG/ACT inhaler Inhale 2 puffs into the lungs every 6 (six) hours as needed. As needed for shortness of breath.  1 Inhaler  11  . brimonidine-timolol (COMBIGAN) 0.2-0.5 % ophthalmic solution Place 1 drop into both eyes every 12 (twelve) hours.      . clonazePAM (KLONOPIN) 0.5 MG tablet Take 1 tablet (0.5 mg total) by mouth 2 (two) times daily as needed (anxiety).  30 tablet  0  . DULoxetine (CYMBALTA) 60 MG capsule Take 60 mg by mouth daily.      . fluticasone-salmeterol (ADVAIR HFA) 230-21 MCG/ACT inhaler Inhale 1 puff into the lungs 2 (two) times daily.  1 Inhaler  12  . insulin glargine (LANTUS) 100 UNIT/ML injection Inject 0.3 mLs (30 Units total) into the skin daily.  10 mL  0  . Insulin Human (INSULIN PUMP) SOLN Inject into  the skin continuous. HUMALOG.      . insulin lispro (HUMALOG) 100 UNIT/ML injection Inject 1-25 Units into the skin 3 (three) times daily before meals.      Marland Kitchen lisinopril (PRINIVIL,ZESTRIL) 10 MG tablet Take 1 tablet (10 mg total) by mouth every morning.      . metoprolol tartrate (LOPRESSOR) 25 MG tablet Take 1 tablet (25 mg total) by mouth as needed (for tachycardia lasting more than 20 minutes).      . montelukast (SINGULAIR) 10 MG tablet Take 10 mg by mouth at bedtime.      . terbinafine (LAMISIL) 250 MG tablet Take 1 tablet (250 mg total) by mouth daily.  30 tablet  2  . TRAZODONE HCL PO Take by mouth.        Psychiatric Specialty Exam:     Blood pressure 124/83, pulse 90, temperature 98.1 F (36.7 C), temperature source Oral, resp. rate 17, last menstrual period 03/13/2014, SpO2 98.00%.There is no weight on file to calculate BMI.  General Appearance: Disheveled  Eye Sport and exercise psychologist::  Fair  Speech:  Slow  Volume:  Decreased  Mood:  Depressed  Affect:  Congruent  Thought Process:  slowed  Orientation:  Full (Time, Place, and Person)  Thought Content:  Rumination  Suicidal Thoughts:  Yes.  with intent/plan  Homicidal Thoughts:  No  Memory:  Immediate;   Good Recent;   Good Remote;   Good  Judgement:  Poor  Insight:  Lacking  Psychomotor Activity:  Decreased  Concentration:  Fair  Recall:  [place] of Knowledge:Fair  Language: Good  Akathisia:  No  Handed:  Right  AIMS (if indicated):     Assets:  Communication Skills Desire for Improvement Financial Resources/Insurance [place] Talents/Skills Transportation Vocational/Educational  Sleep:      Musculoskeletal: Strength & Muscle Tone: within normal limits Gait & Station: normal Patient leans: N/A  Treatment Plan Summary: Continue Abilify 10 mg  daily.  Continue Cymbalta at present dose.  Once her blood sugar level is stable , patient will be admitted to inpatient psychiatric services for  stabilization and treatment.  ARFEEN,SYED T. 03/29/2014 12:26 PM

## 2014-03-30 DIAGNOSIS — R45851 Suicidal ideations: Secondary | ICD-10-CM

## 2014-03-30 DIAGNOSIS — F411 Generalized anxiety disorder: Secondary | ICD-10-CM

## 2014-03-30 DIAGNOSIS — F332 Major depressive disorder, recurrent severe without psychotic features: Secondary | ICD-10-CM

## 2014-03-30 LAB — GLUCOSE, CAPILLARY
GLUCOSE-CAPILLARY: 203 mg/dL — AB (ref 70–99)
GLUCOSE-CAPILLARY: 96 mg/dL (ref 70–99)
Glucose-Capillary: 165 mg/dL — ABNORMAL HIGH (ref 70–99)
Glucose-Capillary: 191 mg/dL — ABNORMAL HIGH (ref 70–99)

## 2014-03-30 MED ORDER — DULOXETINE HCL 60 MG PO CPEP: 60.0000 mg | ORAL_CAPSULE | Freq: Every day | ORAL | Status: DC

## 2014-03-30 MED ORDER — LISINOPRIL 10 MG PO TABS: 10.0000 mg | ORAL_TABLET | Freq: Every morning | ORAL | Status: DC

## 2014-03-30 MED ORDER — INSULIN PUMP: 1.0000 | SUBCUTANEOUS | Status: DC

## 2014-03-30 MED ORDER — HYDROXYZINE HCL 50 MG PO TABS: 50.0000 mg | ORAL_TABLET | Freq: Two times a day (BID) | ORAL | Status: DC | PRN

## 2014-03-30 MED ORDER — BRIMONIDINE TARTRATE-TIMOLOL 0.2-0.5 % OP SOLN: 1.0000 [drp] | Freq: Two times a day (BID) | OPHTHALMIC | Status: DC

## 2014-03-30 MED ORDER — FLUTICASONE-SALMETEROL 230-21 MCG/ACT IN AERO: 1.0000 | INHALATION_SPRAY | Freq: Two times a day (BID) | RESPIRATORY_TRACT | Status: DC

## 2014-03-30 MED ORDER — TERBINAFINE HCL 250 MG PO TABS: 250.0000 mg | ORAL_TABLET | Freq: Every day | ORAL | Status: DC

## 2014-03-30 MED ORDER — MONTELUKAST SODIUM 10 MG PO TABS: 10.0000 mg | ORAL_TABLET | Freq: Every day | ORAL | Status: DC

## 2014-03-30 MED ORDER — ARIPIPRAZOLE 10 MG PO TABS: 10.0000 mg | ORAL_TABLET | Freq: Every day | ORAL | Status: DC

## 2014-03-30 NOTE — Discharge Instructions (Addendum)
For your ongoing behavioral health needs, keep your scheduled appointments with your providers at [facility] [phone]) as noted:  [name], PA-C on Wednesday, 04/01/2014 @ 2:45 pm Rosary Lively, LPC on Thursday, 04/02/2014 @ 12:00 pm  If you have a behavioral health crisis contact [facility] by calling the phone numbers on your No-Harm Contract 24 hours a day, 7 days a week, or go to your local hospital emergency department.

## 2014-03-30 NOTE — H&P (Signed)
Psychiatry supervisory review confirms findings, diagnostic considerations, and therapeutic interventions planned for medically necessary observation unit treatment in determining possible need for inpatient treatment after patient had overdosed with trazodone that morning ambivalent about past problems and future treatment which is imminent.  Delight Hoh, MD

## 2014-03-30 NOTE — Progress Notes (Signed)
Pt was given her discharge instructions along with all of her RX. Her mom and dad came to pick her up. Pt stated she has been on FMLA until,"I get myself squared away." Pt denies Si and Hi and contracts for safety. Pt was walked out of the building.

## 2014-03-30 NOTE — Progress Notes (Signed)
Patient was discharged from Observation Unit earlier this evening. Returned to [facility] to pick up inhaler and two bottles of prescription eye drops. These items returned to patient.

## 2014-03-30 NOTE — Progress Notes (Signed)
D:Pt is pleasant and cooperative interacting in the observation unit with staff and peers. Pt reports that she has a three year old child that is with her parents. She reports that she took an overdose of trazadone before coming to the hospital following her husband leaving and she had been married for six months. Pt reports that she is a [place] and sees a Marketing executive that is a good support.  A:Offered support, encouragement and 15 minute checks. Gave medications as ordered.  R:Pt denies si and hi. Pt denies hallucinations. Safety maintained in the observation unit.

## 2014-03-30 NOTE — Progress Notes (Signed)
Pt. Had a good night sleep after the "sleeping pill". No new complaint. Awaiting Lamisil from the pharmacy. Pt up and eating breakfast at this time.

## 2014-03-30 NOTE — Discharge Summary (Signed)
Psychiatric supervisory review of observation unit therapeutics have determined that impulsive overdose occurred several days before starting outpatient psychiatric and psychotherapeutic care having acute breakup with boyfriend and multiple stressors including diabetic complications and noncompliance with medications. She restarted her antidepressant regimen 2 weeks ago and does acknowledge some overactivation contributing to acting upon her impulsive suicidal ideation. With increased Abilify, psychosocial stabilization, and restoration of support and hope with family and her child, the patient intends to live without self-harm and improve her psychosocial function by following through with upcoming outpatient treatment and moving to the home of her parents for more support and containment. Her parents actively assure suicide prevention and monitoring for the patient. She is safe for release to the community and community-based treatment.  Cristina Hoh, MD

## 2014-03-30 NOTE — Plan of Care (Addendum)
[facility] Observation Crisis Plan  Reason for Crisis Plan:  Crisis Stabilization   Plan of Care:  Refer back to current outpatient providers  Family Support:   Mother: [name]; Father: name unspecified  Current Living Environment:  Pt has been living with a friend, along with the pt's 29 y/o child.  She intends to relocate to her parents' home, where she expects to be received, by the end of this month.  Insurance:   Hospital Account   Name Acct ID Class Status Primary Coverage   [name], [name] [id number] [place] [employer] [place] EM[employer]EE - [place] [employer]        Guarantor Account (for Hospital Account [id number])   Name Relation to Pt Service Area Active? Acct Type   [name] Self [facility] Yes Behavioral Health   Address Phone       [place], [place] [id number] [phone])          Coverage Information (for Hospital Account [id number])   1. [place] EMPLOYEE/[employer] [facility] [employer]   F/O Payor/Plan Precert #   [place] EMPLOYEE/[place] [employer]    Subscriber Subscriber #   [name] [id number]   Address [place] [address] [phone]       2. [place]   F/O Payor/Plan Precert #   [facility] MEDICAID/[place]    Subscriber Subscriber #   [name] [id number]   Address Phone   [address] [place], [place] [address] [phone]          Legal Guardian:   Self  Primary Care Provider:  Odette Fraction, MD  Current Outpatient Providers:  Cristina Locket, PA-C and [name], [facility], both at [facility]  Psychiatrist:   [name], PA-C  Counselor/Therapist:   Rosary West, [facility]  Compliant with Medications:  No; reportly stopped taking psychotropics about 2 months ago.  Additional Information: After consulting with Cristina Pizza, NP it has been determined that pt does not present a life threatening danger  to herself or others, and that psychiatric hospitalization is not indicated for her, provided she is able to contract for safety.  Pt signed a Theatre manager.  Pt reports that she has appointments scheduled with Cristina Locket, PA-C on Wednesday, 04/01/2014 @ 14:45, and with [name], LPC on Thursday, 04/02/2014 @ 12:00, both at [facility].  She is advised to keep these appointments.  Pt signed Consent for Release of Information to these providers and a notification calls has been placed.  [name], [place] Triage Specialist [name] 6/22/20154:13 PM
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
## 2014-03-30 NOTE — Progress Notes (Signed)
Pt is currently in shower. Patient was provided clean blue scrubs, personal hygeine products and clean socks.
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
## 2014-03-31 NOTE — Consult Note (Signed)
Note reviewed and discussed and I agree with the disposition

## 2014-04-03 ENCOUNTER — Telehealth: Payer: Self-pay | Admitting: Family Medicine

## 2014-04-03 MED ORDER — LISINOPRIL 10 MG PO TABS: 10.0000 mg | ORAL_TABLET | Freq: Every morning | ORAL | Status: DC

## 2014-04-03 NOTE — Telephone Encounter (Signed)
Medication refilled per protocol. 

## 2014-04-07 ENCOUNTER — Other Ambulatory Visit: Payer: Self-pay | Admitting: Family Medicine

## 2014-04-07 MED ORDER — LISINOPRIL 10 MG PO TABS: 10.0000 mg | ORAL_TABLET | Freq: Every morning | ORAL | Status: DC

## 2014-04-07 NOTE — Telephone Encounter (Signed)
Med sent to pharm 

## 2014-04-18 ENCOUNTER — Other Ambulatory Visit: Payer: Self-pay | Admitting: *Deleted

## 2014-04-18 DIAGNOSIS — E1065 Type 1 diabetes mellitus with hyperglycemia: Secondary | ICD-10-CM

## 2014-04-18 DIAGNOSIS — IMO0002 Reserved for concepts with insufficient information to code with codable children: Secondary | ICD-10-CM

## 2014-04-18 DIAGNOSIS — E108 Type 1 diabetes mellitus with unspecified complications: Principal | ICD-10-CM

## 2014-04-21 ENCOUNTER — Other Ambulatory Visit: Payer: Self-pay | Admitting: Family Medicine

## 2014-04-21 DIAGNOSIS — E119 Type 2 diabetes mellitus without complications: Secondary | ICD-10-CM

## 2014-04-22 ENCOUNTER — Other Ambulatory Visit: Payer: 59

## 2014-04-22 DIAGNOSIS — IMO0002 Reserved for concepts with insufficient information to code with codable children: Secondary | ICD-10-CM

## 2014-04-22 DIAGNOSIS — E1065 Type 1 diabetes mellitus with hyperglycemia: Secondary | ICD-10-CM

## 2014-04-22 DIAGNOSIS — E119 Type 2 diabetes mellitus without complications: Secondary | ICD-10-CM

## 2014-04-22 DIAGNOSIS — E108 Type 1 diabetes mellitus with unspecified complications: Principal | ICD-10-CM

## 2014-04-22 LAB — LIPID PANEL
CHOL/HDL RATIO: 2.1 ratio
Cholesterol: 149 mg/dL (ref 0–200)
HDL: 71 mg/dL (ref >39)
LDL Cholesterol: 68 mg/dL (ref 0–99)
TRIGLYCERIDES: 49 mg/dL (ref <150)
VLDL: 10 mg/dL (ref 0–40)

## 2014-04-22 LAB — HEMOGLOBIN A1C
Hgb A1c MFr Bld: 13.3 % — ABNORMAL HIGH (ref <5.7)
Mean Plasma Glucose: 335 mg/dL — ABNORMAL HIGH (ref <117)

## 2014-05-28 ENCOUNTER — Encounter: Payer: Self-pay | Admitting: Obstetrics & Gynecology

## 2014-05-28 ENCOUNTER — Emergency Department (HOSPITAL_COMMUNITY)
Admission: EM | Admit: 2014-05-28 | Discharge: 2014-05-31 | Disposition: A | Discharge status: Other Acute Inpatient Hospital | Payer: 59 | Attending: Emergency Medicine | Admitting: Emergency Medicine

## 2014-05-28 ENCOUNTER — Ambulatory Visit (INDEPENDENT_AMBULATORY_CARE_PROVIDER_SITE_OTHER): Payer: 59 | Admitting: Obstetrics & Gynecology

## 2014-05-28 ENCOUNTER — Encounter (HOSPITAL_COMMUNITY): Payer: Self-pay | Admitting: Emergency Medicine

## 2014-05-28 VITALS — BP 127/85 | HR 102 | Temp 98.3°F | Ht 66.0 in | Wt 197.0 lb

## 2014-05-28 DIAGNOSIS — E1065 Type 1 diabetes mellitus with hyperglycemia: Secondary | ICD-10-CM

## 2014-05-28 DIAGNOSIS — T50992A Poisoning by other drugs, medicaments and biological substances, intentional self-harm, initial encounter: Secondary | ICD-10-CM | POA: Insufficient documentation

## 2014-05-28 DIAGNOSIS — Z01818 Encounter for other preprocedural examination: Secondary | ICD-10-CM

## 2014-05-28 DIAGNOSIS — F329 Major depressive disorder, single episode, unspecified: Secondary | ICD-10-CM | POA: Insufficient documentation

## 2014-05-28 DIAGNOSIS — E1142 Type 2 diabetes mellitus with diabetic polyneuropathy: Secondary | ICD-10-CM | POA: Insufficient documentation

## 2014-05-28 DIAGNOSIS — F411 Generalized anxiety disorder: Secondary | ICD-10-CM | POA: Insufficient documentation

## 2014-05-28 DIAGNOSIS — E663 Overweight: Secondary | ICD-10-CM | POA: Insufficient documentation

## 2014-05-28 DIAGNOSIS — Z3202 Encounter for pregnancy test, result negative: Secondary | ICD-10-CM

## 2014-05-28 DIAGNOSIS — T465X1A Poisoning by other antihypertensive drugs, accidental (unintentional), initial encounter: Secondary | ICD-10-CM | POA: Insufficient documentation

## 2014-05-28 DIAGNOSIS — Z30430 Encounter for insertion of intrauterine contraceptive device: Secondary | ICD-10-CM

## 2014-05-28 DIAGNOSIS — T447X2A Poisoning by beta-adrenoreceptor antagonists, intentional self-harm, initial encounter: Secondary | ICD-10-CM

## 2014-05-28 DIAGNOSIS — F3289 Other specified depressive episodes: Secondary | ICD-10-CM | POA: Insufficient documentation

## 2014-05-28 DIAGNOSIS — E1049 Type 1 diabetes mellitus with other diabetic neurological complication: Secondary | ICD-10-CM | POA: Insufficient documentation

## 2014-05-28 DIAGNOSIS — Z87442 Personal history of urinary calculi: Secondary | ICD-10-CM | POA: Insufficient documentation

## 2014-05-28 DIAGNOSIS — I10 Essential (primary) hypertension: Secondary | ICD-10-CM | POA: Insufficient documentation

## 2014-05-28 DIAGNOSIS — T50902D Poisoning by unspecified drugs, medicaments and biological substances, intentional self-harm, subsequent encounter: Secondary | ICD-10-CM

## 2014-05-28 DIAGNOSIS — F332 Major depressive disorder, recurrent severe without psychotic features: Secondary | ICD-10-CM | POA: Insufficient documentation

## 2014-05-28 DIAGNOSIS — Z794 Long term (current) use of insulin: Secondary | ICD-10-CM | POA: Insufficient documentation

## 2014-05-28 DIAGNOSIS — IMO0002 Reserved for concepts with insufficient information to code with codable children: Secondary | ICD-10-CM

## 2014-05-28 DIAGNOSIS — Z79899 Other long term (current) drug therapy: Secondary | ICD-10-CM | POA: Insufficient documentation

## 2014-05-28 DIAGNOSIS — T50902A Poisoning by unspecified drugs, medicaments and biological substances, intentional self-harm, initial encounter: Secondary | ICD-10-CM

## 2014-05-28 DIAGNOSIS — J45909 Unspecified asthma, uncomplicated: Secondary | ICD-10-CM | POA: Insufficient documentation

## 2014-05-28 DIAGNOSIS — E108 Type 1 diabetes mellitus with unspecified complications: Secondary | ICD-10-CM

## 2014-05-28 LAB — CBC WITH DIFFERENTIAL/PLATELET
Basophils Absolute: 0.1 10*3/uL (ref 0.0–0.1)
Basophils Relative: 1 % (ref 0–1)
EOS ABS: 0.5 10*3/uL (ref 0.0–0.7)
Eosinophils Relative: 6 % — ABNORMAL HIGH (ref 0–5)
HCT: 37.2 % (ref 36.0–46.0)
Hemoglobin: 12.5 g/dL (ref 12.0–15.0)
Lymphocytes Relative: 28 % (ref 12–46)
Lymphs Abs: 2.5 10*3/uL (ref 0.7–4.0)
MCH: 28.2 pg (ref 26.0–34.0)
MCHC: 33.6 g/dL (ref 30.0–36.0)
MCV: 83.8 fL (ref 78.0–100.0)
Monocytes Absolute: 0.6 10*3/uL (ref 0.1–1.0)
Monocytes Relative: 6 % (ref 3–12)
NEUTROS PCT: 59 % (ref 43–77)
Neutro Abs: 5.3 10*3/uL (ref 1.7–7.7)
PLATELETS: 346 10*3/uL (ref 150–400)
RBC: 4.44 MIL/uL (ref 3.87–5.11)
RDW: 13.3 % (ref 11.5–15.5)
WBC: 9 10*3/uL (ref 4.0–10.5)

## 2014-05-28 LAB — CBG MONITORING, ED: Glucose-Capillary: 437 mg/dL — ABNORMAL HIGH (ref 70–99)

## 2014-05-28 LAB — POCT URINE PREGNANCY: Preg Test, Ur: NEGATIVE

## 2014-05-28 MED ORDER — ACTIDOSE WITH SORBITOL 50 GM/240ML PO LIQD
50.0000 g | ORAL | Status: AC
  Administered 2014-05-29: 50 g via ORAL
  Filled 2014-05-28: qty 240

## 2014-05-28 MED ORDER — SODIUM CHLORIDE 0.9 % IV SOLN
1000.0000 mL | INTRAVENOUS | Status: DC
  Administered 2014-05-29: 1000 mL via INTRAVENOUS

## 2014-05-28 MED ORDER — SODIUM CHLORIDE 0.9 % IV SOLN
1000.0000 mL | Freq: Once | INTRAVENOUS | Status: AC
  Administered 2014-05-29: 1000 mL via INTRAVENOUS

## 2014-05-28 NOTE — ED Notes (Signed)
Per EMS CBG was 402

## 2014-05-28 NOTE — ED Provider Notes (Signed)
CSN: [id number]     Arrival date & time 05/28/14  2259 History   First MD Initiated Contact with Patient 05/28/14 2303     Chief Complaint  Patient presents with  . Ingestion     (Consider location/radiation/quality/duration/timing/severity/associated sxs/prior Treatment) HPI 28 year old female presents emergency department from home via EMS after overdose.  Patient reports this was a suicide attempt.  She reports history of depression and previous overdose attempts, most recently in June.  Patient reports that she took an unknown amount of metoprolol.  She reports she emptied the bottle in her mouth.  She received Heimlich from bystander, and threw up about 5 pills.  Patient thinks that she kept down another 5 or 6.  She denies any ill effects at this time.  Overdose occurred roughly around 10 PM.  Patient has history of intermittent tachycardia for which she takes metoprolol when necessary.  She has history of diabetes, depression Past Medical History  Diagnosis Date  . Asthma   . Tachycardia     baseline tachycardia   . Retinopathy due to secondary diabetes   . Diabetic gastroparesis   . 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 Diabetic neuropathy, type I diabetes mellitus   . Polyneuropathy in diabetes(357.2)   . Anxiety   . Hypertension   . Depression   . Asthma 11/10/2013  . Headache(784.0)   . Kidney stones    Past Surgical History  Procedure Laterality Date  . Cholecystectomy    . Cesarean section    . Refractive surgery     Family History  Problem Relation Age of Onset  . Hypertension     History  Substance Use Topics  . Smoking status: Never Smoker   . Smokeless tobacco: Never Used  . Alcohol Use: No   OB History   Grav Para Term Preterm Abortions TAB SAB Ect Mult Living   1 1        1      Review of Systems   See History of Present Illness; otherwise all other systems are reviewed and negative  Allergies  Ceftin  Home  Medications   Prior to Admission medications   Medication Sig Start Date End Date Taking? Authorizing Provider  albuterol (PROVENTIL HFA;VENTOLIN HFA) 108 (90 BASE) MCG/ACT inhaler Inhale 2 puffs into the lungs every 6 (six) hours as needed. As needed for shortness of breath. 01/03/13  Yes [name], MD  ARIPiprazole (ABILIFY) 20 MG tablet Take 20 mg by mouth daily.   Yes Historical Provider, MD  brimonidine-timolol (COMBIGAN) 0.2-0.5 % ophthalmic solution Place 1 drop into both eyes every 12 (twelve) hours. 03/30/14  Yes Benjamine Mola, FNP  busPIRone (BUSPAR) 15 MG tablet Take 30 mg by mouth 2 (two) times daily.   Yes Historical Provider, MD  clonazePAM (KLONOPIN) 0.5 MG tablet Take 1 tablet (0.5 mg total) by mouth 2 (two) times daily as needed (anxiety). 03/27/14  Yes Mary B Dixon, PA-C  DULoxetine (CYMBALTA) 60 MG capsule Take 1 capsule (60 mg total) by mouth daily. 03/30/14  Yes Benjamine Mola, FNP  fluticasone-salmeterol (ADVAIR HFA) 230-21 MCG/ACT inhaler Inhale 1 puff into the lungs 2 (two) times daily. 03/30/14  Yes Benjamine Mola, FNP  hydrOXYzine (ATARAX/VISTARIL) 50 MG tablet Take 1 tablet (50 mg total) by mouth 2 (two) times daily as needed for anxiety. 03/30/14  Yes Benjamine Mola, FNP  ibuprofen (ADVIL,MOTRIN) 200 MG tablet Take 600-800 mg by mouth every  6 (six) hours as needed for moderate pain.   Yes Historical Provider, MD  Insulin Human (INSULIN PUMP) SOLN Inject 1 each into the skin continuous. HUMALOG. 03/30/14  Yes Benjamine Mola, FNP  lisinopril (PRINIVIL,ZESTRIL) 10 MG tablet Take 1 tablet (10 mg total) by mouth every morning. 04/07/14  Yes [name], MD  metoprolol tartrate (LOPRESSOR) 25 MG tablet Take 25 mg by mouth as needed (tachycardia).    Yes Historical Provider, MD  montelukast (SINGULAIR) 10 MG tablet Take 1 tablet (10 mg total) by mouth at bedtime. 03/30/14  Yes Benjamine Mola, FNP  Prenatal Vit-Fe Fumarate-FA (PRENATAL MULTIVITAMIN) TABS tablet Take 1 tablet  by mouth daily at 12 noon.   Yes Historical Provider, MD  terbinafine (LAMISIL) 250 MG tablet Take 1 tablet (250 mg total) by mouth daily. 03/30/14  Yes Benjamine Mola, FNP  traZODone (DESYREL) 50 MG tablet Take 50 mg by mouth at bedtime.   Yes Historical Provider, MD   BP 134/82  Pulse 76  Temp(Src) 98.5 F (36.9 C) (Oral)  SpO2 95%  LMP 05/27/2014 Physical Exam  Nursing note and vitals reviewed. Constitutional: She is oriented to person, place, and time. She appears distressed.  Tearful overweight female, awake alert  HENT:  Head: Normocephalic.  Right Ear: External ear normal.  Left Ear: External ear normal.  Nose: Nose normal.  Mouth/Throat: Oropharynx is clear and moist. No oropharyngeal exudate.  Patient has minor scratches to the right side of her face secondary to bystanders trying to get her to spit up the pills  Eyes:  Patient has some conjunctival hemorrhage on the right, injected conjunctiva  Neck: Normal range of motion. Neck supple. No JVD present. No tracheal deviation present.  Cardiovascular: Normal rate, regular rhythm, normal heart sounds and intact distal pulses.  Exam reveals no gallop and no friction rub.   No murmur heard. Pulmonary/Chest: Effort normal and breath sounds normal. No stridor. No respiratory distress. She has no wheezes. She has no rales. She exhibits no tenderness.  Abdominal: Soft. Bowel sounds are normal. She exhibits no distension and no mass. There is no tenderness. There is no rebound and no guarding.  Musculoskeletal: Normal range of motion. She exhibits no edema and no tenderness.  Lymphadenopathy:    She has no cervical adenopathy.  Neurological: She is alert and oriented to person, place, and time. No cranial nerve deficit. She exhibits normal muscle tone. Coordination normal.  Skin: Skin is warm and dry. No rash noted. No erythema. No pallor.  Psychiatric:  Patient has depressed mood, she reports suicidal ideation.  Hopeless    ED  Course  Procedures (including critical care time) Labs Review Labs Reviewed  CBC WITH DIFFERENTIAL - Abnormal; Notable for the following:    Eosinophils Relative 6 (*)    All other components within normal limits  COMPREHENSIVE METABOLIC PANEL - Abnormal; Notable for the following:    Sodium 132 (*)    Chloride 95 (*)    Glucose, Bld 544 (*)    Albumin 3.2 (*)    Alkaline Phosphatase 137 (*)    Total Bilirubin 0.2 (*)    GFR calc non Af Amer 74 (*)    GFR calc Af Amer 85 (*)    All other components within normal limits  URINALYSIS, ROUTINE W REFLEX MICROSCOPIC - Abnormal; Notable for the following:    Specific Gravity, Urine 1.036 (*)    Glucose, UA >1000 (*)    Hgb urine dipstick SMALL (*)  Protein, ur 30 (*)    All other components within normal limits  SALICYLATE LEVEL - Abnormal; Notable for the following:    Salicylate Lvl <1.7 (*)    All other components within normal limits  CBG MONITORING, ED - Abnormal; Notable for the following:    Glucose-Capillary 437 (*)    All other components within normal limits  CBG MONITORING, ED - Abnormal; Notable for the following:    Glucose-Capillary 309 (*)    All other components within normal limits  ACETAMINOPHEN LEVEL  URINE RAPID DRUG SCREEN (HOSP PERFORMED)  ETHANOL  URINE MICROSCOPIC-ADD ON  POC URINE PREG, ED  CBG MONITORING, ED    Imaging Review No results found.   EKG Interpretation   Date/Time:  Thursday May 28 2014 23:05:11 EDT Ventricular Rate:  77 PR Interval:  130 QRS Duration: 83 QT Interval:  396 QTC Calculation: 448 R Axis:   70 Text Interpretation:  Sinus rhythm ST elev, probable normal early repol  pattern No significant change since last tracing Confirmed by [facility] [employer]  MD,  Ozias Dicenzo ([id number]) on 05/29/2014 5:04:46 AM      MDM   Final diagnoses:  Drug overdose, intentional, subsequent encounter  Type I (juvenile type) diabetes mellitus with unspecified complication, uncontrolled  Major depressive  disorder, recurrent, severe without psychotic features  Suicide attempt by beta blocker overdose, initial encounter   28 year old female with suicide attempt by overdose of metoprolol.  Patient is still within the window for charcoal, we'll monitor closely for any somnolence, hypertension, bradycardia.  We'll monitor in the ED for 6 hours.     5:04 AM Pt has been monitored, no signs of hypotension or bradycardia.  She is medically cleared.  Kalman Drape, MD 05/29/14 [id number]

## 2014-05-28 NOTE — ED Notes (Signed)
Poison control called and they suggested a dose of charcoal if ingested an hour ago, observation for 6 hours for hypotension and bradycardia.

## 2014-05-28 NOTE — ED Notes (Signed)
Pt took the bottle of metoprolol and emptied it into her mouth, someone in the house did the heimlich and she did spit some of them up. Pt was arguing with her mother per EMS

## 2014-05-28 NOTE — ED Notes (Signed)
Bed: RESA Expected date: 05/28/14 Expected time: 10:40 PM Means of arrival: Ambulance Comments: Overdose on metoprolol

## 2014-05-29 ENCOUNTER — Encounter (HOSPITAL_COMMUNITY): Payer: Self-pay | Admitting: Psychiatry

## 2014-05-29 DIAGNOSIS — R45851 Suicidal ideations: Secondary | ICD-10-CM

## 2014-05-29 DIAGNOSIS — F411 Generalized anxiety disorder: Secondary | ICD-10-CM

## 2014-05-29 DIAGNOSIS — F332 Major depressive disorder, recurrent severe without psychotic features: Secondary | ICD-10-CM

## 2014-05-29 LAB — COMPREHENSIVE METABOLIC PANEL
ALT: 9 U/L (ref 0–35)
AST: 11 U/L (ref 0–37)
Albumin: 3.2 g/dL — ABNORMAL LOW (ref 3.5–5.2)
Alkaline Phosphatase: 137 U/L — ABNORMAL HIGH (ref 39–117)
Anion gap: 11 (ref 5–15)
BUN: 17 mg/dL (ref 6–23)
CO2: 26 mEq/L (ref 19–32)
Calcium: 9 mg/dL (ref 8.4–10.5)
Chloride: 95 mEq/L — ABNORMAL LOW (ref 96–112)
Creatinine, Ser: 1.02 mg/dL (ref 0.50–1.10)
GFR calc Af Amer: 85 mL/min — ABNORMAL LOW (ref >90)
GFR calc non Af Amer: 74 mL/min — ABNORMAL LOW (ref >90)
Glucose, Bld: 544 mg/dL — ABNORMAL HIGH (ref 70–99)
POTASSIUM: 4.9 meq/L (ref 3.7–5.3)
SODIUM: 132 meq/L — AB (ref 137–147)
TOTAL PROTEIN: 6.6 g/dL (ref 6.0–8.3)
Total Bilirubin: 0.2 mg/dL — ABNORMAL LOW (ref 0.3–1.2)

## 2014-05-29 LAB — SALICYLATE LEVEL: Salicylate Lvl: <2 mg/dL — ABNORMAL LOW (ref 2.8–20.0)

## 2014-05-29 LAB — RAPID URINE DRUG SCREEN, HOSP PERFORMED
AMPHETAMINES: NOT DETECTED
BENZODIAZEPINES: NOT DETECTED
Barbiturates: NOT DETECTED
Cocaine: NOT DETECTED
Opiates: NOT DETECTED
TETRAHYDROCANNABINOL: NOT DETECTED

## 2014-05-29 LAB — CBG MONITORING, ED
GLUCOSE-CAPILLARY: 100 mg/dL — AB (ref 70–99)
GLUCOSE-CAPILLARY: 220 mg/dL — AB (ref 70–99)
Glucose-Capillary: 309 mg/dL — ABNORMAL HIGH (ref 70–99)
Glucose-Capillary: 174 mg/dL — ABNORMAL HIGH (ref 70–99)
Glucose-Capillary: 294 mg/dL — ABNORMAL HIGH (ref 70–99)

## 2014-05-29 LAB — URINALYSIS, ROUTINE W REFLEX MICROSCOPIC
BILIRUBIN URINE: NEGATIVE
Glucose, UA: >1000 mg/dL — AB
KETONES UR: NEGATIVE mg/dL
Leukocytes, UA: NEGATIVE
Nitrite: NEGATIVE
PH: 5.5 (ref 5.0–8.0)
Protein, ur: 30 mg/dL — AB
Specific Gravity, Urine: 1.036 — ABNORMAL HIGH (ref 1.005–1.030)
UROBILINOGEN UA: 0.2 mg/dL (ref 0.0–1.0)

## 2014-05-29 LAB — URINE MICROSCOPIC-ADD ON

## 2014-05-29 LAB — GC/CHLAMYDIA PROBE AMP
CT Probe RNA: NEGATIVE
GC Probe RNA: NEGATIVE

## 2014-05-29 LAB — ETHANOL: Alcohol, Ethyl (B): <11 mg/dL (ref 0–11)

## 2014-05-29 LAB — ACETAMINOPHEN LEVEL: Acetaminophen (Tylenol), Serum: <15 ug/mL (ref 10–30)

## 2014-05-29 LAB — POC URINE PREG, ED: Preg Test, Ur: NEGATIVE

## 2014-05-29 MED ORDER — DULOXETINE HCL 60 MG PO CPEP
60.0000 mg | ORAL_CAPSULE | Freq: Every day | ORAL | Status: DC
  Administered 2014-05-29 – 2014-05-31 (×3): 60 mg via ORAL
  Filled 2014-05-29 (×4): qty 1

## 2014-05-29 MED ORDER — IBUPROFEN 200 MG PO TABS
600.0000 mg | ORAL_TABLET | Freq: Three times a day (TID) | ORAL | Status: DC | PRN
  Administered 2014-05-30: 600 mg via ORAL
  Filled 2014-05-29: qty 3

## 2014-05-29 MED ORDER — MOMETASONE FURO-FORMOTEROL FUM 200-5 MCG/ACT IN AERO
2.0000 | INHALATION_SPRAY | Freq: Two times a day (BID) | RESPIRATORY_TRACT | Status: DC
  Administered 2014-05-29 – 2014-05-31 (×6): 2 via RESPIRATORY_TRACT
  Filled 2014-05-29: qty 8.8

## 2014-05-29 MED ORDER — CLONAZEPAM 0.5 MG PO TABS
0.5000 mg | ORAL_TABLET | Freq: Two times a day (BID) | ORAL | Status: DC | PRN
  Administered 2014-05-29 – 2014-05-31 (×3): 0.5 mg via ORAL
  Filled 2014-05-29 (×3): qty 1

## 2014-05-29 MED ORDER — LORAZEPAM 1 MG PO TABS: 1.0000 mg | ORAL_TABLET | Freq: Three times a day (TID) | ORAL | Status: DC | PRN

## 2014-05-29 MED ORDER — TERBINAFINE HCL 250 MG PO TABS
250.0000 mg | ORAL_TABLET | Freq: Every day | ORAL | Status: DC
  Administered 2014-05-29 – 2014-05-31 (×3): 250 mg via ORAL
  Filled 2014-05-29 (×6): qty 1

## 2014-05-29 MED ORDER — INSULIN ASPART 100 UNIT/ML ~~LOC~~ SOLN
8.0000 [IU] | Freq: Once | SUBCUTANEOUS | Status: AC
  Administered 2014-05-29: 8 [IU] via SUBCUTANEOUS
  Filled 2014-05-29: qty 1

## 2014-05-29 MED ORDER — ALUM & MAG HYDROXIDE-SIMETH 200-200-20 MG/5ML PO SUSP: 30.0000 mL | ORAL | Status: DC | PRN

## 2014-05-29 MED ORDER — INSULIN GLARGINE 100 UNIT/ML ~~LOC~~ SOLN
38.0000 [IU] | Freq: Every day | SUBCUTANEOUS | Status: DC
  Administered 2014-05-29 – 2014-05-31 (×3): 38 [IU] via SUBCUTANEOUS
  Filled 2014-05-29 (×5): qty 0.38

## 2014-05-29 MED ORDER — INSULIN GLARGINE 100 UNIT/ML ~~LOC~~ SOLN
38.0000 [IU] | Freq: Once | SUBCUTANEOUS | Status: AC
  Administered 2014-05-29: 38 [IU] via SUBCUTANEOUS
  Filled 2014-05-29: qty 0.38

## 2014-05-29 MED ORDER — HYDROXYZINE HCL 25 MG PO TABS
50.0000 mg | ORAL_TABLET | Freq: Two times a day (BID) | ORAL | Status: DC | PRN
  Administered 2014-05-30 – 2014-05-31 (×2): 50 mg via ORAL
  Filled 2014-05-29 (×2): qty 2

## 2014-05-29 MED ORDER — ARIPIPRAZOLE 10 MG PO TABS
20.0000 mg | ORAL_TABLET | Freq: Every day | ORAL | Status: DC
  Administered 2014-05-29 – 2014-05-31 (×3): 20 mg via ORAL
  Filled 2014-05-29 (×4): qty 2

## 2014-05-29 MED ORDER — ACETAMINOPHEN 325 MG PO TABS
650.0000 mg | ORAL_TABLET | ORAL | Status: DC | PRN
  Administered 2014-05-29: 650 mg via ORAL
  Filled 2014-05-29: qty 2

## 2014-05-29 MED ORDER — TIMOLOL MALEATE 0.5 % OP SOLN
1.0000 [drp] | Freq: Two times a day (BID) | OPHTHALMIC | Status: DC
  Administered 2014-05-29 – 2014-05-31 (×6): 1 [drp] via OPHTHALMIC
  Filled 2014-05-29 (×2): qty 5

## 2014-05-29 MED ORDER — TRAZODONE HCL 50 MG PO TABS
50.0000 mg | ORAL_TABLET | Freq: Every day | ORAL | Status: DC
  Administered 2014-05-29 – 2014-05-30 (×2): 50 mg via ORAL
  Filled 2014-05-29 (×2): qty 1

## 2014-05-29 MED ORDER — LISINOPRIL 10 MG PO TABS
10.0000 mg | ORAL_TABLET | Freq: Every morning | ORAL | Status: DC
  Administered 2014-05-29 – 2014-05-31 (×3): 10 mg via ORAL
  Filled 2014-05-29 (×4): qty 1

## 2014-05-29 MED ORDER — BUSPIRONE HCL 10 MG PO TABS
30.0000 mg | ORAL_TABLET | Freq: Two times a day (BID) | ORAL | Status: DC
  Administered 2014-05-29 – 2014-05-31 (×6): 30 mg via ORAL
  Filled 2014-05-29 (×6): qty 3

## 2014-05-29 MED ORDER — MONTELUKAST SODIUM 10 MG PO TABS
10.0000 mg | ORAL_TABLET | Freq: Every day | ORAL | Status: DC
  Administered 2014-05-29 – 2014-05-31 (×3): 10 mg via ORAL
  Filled 2014-05-29 (×4): qty 1

## 2014-05-29 MED ORDER — NICOTINE 21 MG/24HR TD PT24
21.0000 mg | MEDICATED_PATCH | Freq: Every day | TRANSDERMAL | Status: DC
  Filled 2014-05-29: qty 1

## 2014-05-29 MED ORDER — ALBUTEROL SULFATE HFA 108 (90 BASE) MCG/ACT IN AERS: 2.0000 | INHALATION_SPRAY | Freq: Four times a day (QID) | RESPIRATORY_TRACT | Status: DC | PRN

## 2014-05-29 MED ORDER — INSULIN ASPART 100 UNIT/ML ~~LOC~~ SOLN
0.0000 [IU] | Freq: Three times a day (TID) | SUBCUTANEOUS | Status: DC
  Administered 2014-05-29: 3 [IU] via SUBCUTANEOUS
  Administered 2014-05-29: 8 [IU] via SUBCUTANEOUS
  Administered 2014-05-30: 5 [IU] via SUBCUTANEOUS
  Administered 2014-05-30: 2 [IU] via SUBCUTANEOUS
  Administered 2014-05-30: 5 [IU] via SUBCUTANEOUS
  Administered 2014-05-31: 2 [IU] via SUBCUTANEOUS
  Administered 2014-05-31: 3 [IU] via SUBCUTANEOUS
  Filled 2014-05-29 (×6): qty 1

## 2014-05-29 MED ORDER — ZOLPIDEM TARTRATE 5 MG PO TABS
5.0000 mg | ORAL_TABLET | Freq: Every evening | ORAL | Status: DC | PRN
Start: 2014-05-29 — End: 2014-05-31

## 2014-05-29 MED ORDER — BRIMONIDINE TARTRATE-TIMOLOL 0.2-0.5 % OP SOLN
1.0000 [drp] | Freq: Two times a day (BID) | OPHTHALMIC | Status: DC
Start: 2014-05-29 — End: 2014-05-29

## 2014-05-29 MED ORDER — BRIMONIDINE TARTRATE 0.2 % OP SOLN
1.0000 [drp] | Freq: Two times a day (BID) | OPHTHALMIC | Status: DC
  Administered 2014-05-29 – 2014-05-31 (×6): 1 [drp] via OPHTHALMIC
  Filled 2014-05-29 (×2): qty 5

## 2014-05-29 MED ORDER — INSULIN ASPART 100 UNIT/ML ~~LOC~~ SOLN
10.0000 [IU] | Freq: Once | SUBCUTANEOUS | Status: AC
  Administered 2014-05-29: 10 [IU] via SUBCUTANEOUS
  Filled 2014-05-29: qty 1

## 2014-05-29 MED ORDER — ONDANSETRON HCL 4 MG PO TABS: 4.0000 mg | ORAL_TABLET | Freq: Three times a day (TID) | ORAL | Status: DC | PRN

## 2014-05-29 MED ORDER — INSULIN ASPART 100 UNIT/ML ~~LOC~~ SOLN
4.0000 [IU] | Freq: Three times a day (TID) | SUBCUTANEOUS | Status: DC
  Administered 2014-05-29 – 2014-05-30 (×5): 4 [IU] via SUBCUTANEOUS
  Filled 2014-05-29 (×3): qty 1

## 2014-05-29 NOTE — Patient Instructions (Signed)
IUD PLACEMENT POST-PROCEDURE INSTRUCTIONS  1. You may take Ibuprofen, Aleve or Tylenol for pain if needed.  Cramping should resolve within in 24 hours.  2. You may have a small amount of spotting.  You should wear a mini pad for the next few days.  3. You may have intercourse after 24 hours.  If you using this for birth control, it is effective immediately.  4. You need to call if you have any pelvic pain, fever, heavy bleeding or foul smelling vaginal discharge.  Irregular bleeding is common the first several months after having an IUD placed. You do not need to call for this reason unless you are concerned.  5. Shower or bathe as normal

## 2014-05-29 NOTE — BH Assessment (Signed)
[facility] Assessment Progress Note  [name], counselor from [facility] called and agreed with disposition of IP treatment due to 3 recent suicide attempts, increased anxiety.

## 2014-05-29 NOTE — ED Notes (Signed)
Patient denies pain and is resting comfortably.  
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
## 2014-05-29 NOTE — BH Assessment (Signed)
Assessment Note  [name] is an 28 y.o. female with hx of Anxiety, Depression, Bipolar Disorder, and Personality Disorder. She presents to [facility] after overdosing on metoprolol. Sts that last weekend she had a party in her apartment and invited her landlords daughter.Sts that things such as alcohol use were going on at the party. Sts that landlords daughter who is a minor should not have been a part of the drinking but unfortunately participated in the party festivities. The landlord/girls father found out a about the drinking at the party and went to patient's parents. Patient was approached by the land lord and her parents today. Patient was told that she would have to move out and leave the premises no later than 06/09/2014. Patient initially felt overwhelmed as "My father put me down and made me feel low". Sts that she was given a ultimatum by her father to either go to [facility] for a inpatient admission or police would be notified about the party involving alcohol and a minor. Patient went to her room to pack but instead of packing impulsively emptied a bottle of metoprolol in her mouth. Sts that she only did this because she was upset and no longer feels suicidal. Patient sts that she can't go home until she receives inpatient treatment as she was ordered by her father. Patient has a hx of 4 prior suicide attempts (overdoses and jumping out of window).  Patient denies HI, AVH's, and drug use. She socially drinks alcohol 1-2x's per month.   Patient hospitalized at [facility] in the past 2-3'xs.   Psychiatrist Novella Olive, MD is with [facility]. Patient's therapist is Rosary Lively with [facility].   Axis I: Bipolar, Depressed and Anxiety Disorder NOS Axis II: Deferred Axis III:  Past Medical History  Diagnosis Date  . Asthma   . Tachycardia     baseline tachycardia   . Retinopathy due to secondary diabetes   . Diabetic gastroparesis   . 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 Diabetic neuropathy, type I diabetes mellitus   . Polyneuropathy in diabetes(357.2)   . Anxiety   . Hypertension   . Depression   . Asthma 11/10/2013  . Headache(784.0)   . Kidney stones    Axis IV: other psychosocial or environmental problems, problems related to social environment, problems with access to health care services and problems with primary support group Axis V: 31-40 impairment in reality testing  Past Medical History:  Past Medical History  Diagnosis Date  . Asthma   . Tachycardia     baseline tachycardia   . Retinopathy due to secondary diabetes   . Diabetic gastroparesis   . 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 Diabetic neuropathy, type I diabetes mellitus   . Polyneuropathy in diabetes(357.2)   . Anxiety   . Hypertension   . Depression   . Asthma 11/10/2013  . Headache(784.0)   . Kidney stones     Past Surgical History  Procedure Laterality Date  . Cholecystectomy    . Cesarean section    . Refractive surgery      Family History:  Family History  Problem Relation Age of Onset  . Hypertension      Social History:  reports that she has never smoked. She has never used smokeless tobacco. She reports that she does not drink alcohol or use illicit drugs.  Additional Social History:  Alcohol / Drug  Use Pain Medications: SEE MAR Prescriptions: SEE MAR Over the Counter: SEE MAR History of alcohol / drug use?: No history of alcohol / drug abuse  CIWA: CIWA-Ar BP: 123/76 mmHg Pulse Rate: 78 COWS:    Allergies:  Allergies  Allergen Reactions  . Ceftin Rash    Home Medications:  (Not in a hospital admission)  OB/GYN Status:  Patient's last menstrual period was 05/27/2014.  General Assessment Data Location of Assessment: [facility] Is this a Tele or Face-to-Face Assessment?: Face-to-Face Is this an Initial Assessment or a Re-assessment for this encounter?:  Initial Assessment Living Arrangements: Other (Comment);Children (lives with 6 yr old daughter) Can pt return to current living arrangement?: Yes Admission Status: Voluntary Is patient capable of signing voluntary admission?: Yes Transfer from: [facility] Referral Source: Self/Family/Friend     [place] Living Arrangements: Other (Comment);Children (lives with 67 yr old daughter) Name of Psychiatrist:  (Dr. Novella Olive with [facility] ) Name of Therapist:  Rosary Lively with [facility] )  Education Status Is patient currently in school?: No  Risk to self with the past 6 months Suicidal Ideation: No-Not Currently/Within Last 6 Months Suicidal Intent: Yes-Currently Present Is patient at risk for suicide?: Yes Suicidal Plan?: Yes-Currently Present Specify Current Suicidal Plan:  (patient overdosed prior to arrival) Access to Means: Yes Specify Access to Suicidal Means:  (access to RX medication-metoprolol) What has been your use of drugs/alcohol within the last 12 months?:  (socially drinks alcohol ) Previous Attempts/Gestures: Yes How many times?:  (3x's-overdoses; 1x-tried to jump out window) Other Self Harm Risks:  (none reported ) Triggers for Past Attempts: Other (Comment) (depression, impulsivity, irritability ) Intentional Self Injurious Behavior: None Family Suicide History: No Recent stressful life event(s): Other (Comment) (must be out of apt by 06/09/2014; stick parents ) Persecutory voices/beliefs?: No Depression: Yes Depression Symptoms: Feeling angry/irritable;Feeling worthless/self pity;Loss of interest in usual pleasures;Guilt;Insomnia;Despondent;Tearfulness;Fatigue;Isolating Substance abuse history and/or treatment for substance abuse?: No Suicide prevention information given to non-admitted patients: Not applicable  Risk to Others within the past 6 months Homicidal Ideation: No Thoughts of Harm to Others: No Current  Homicidal Intent: No Current Homicidal Plan: No Access to Homicidal Means: No Identified Victim:  (n/a) History of harm to others?: No Assessment of Violence: None Noted Violent Behavior Description:  (patient is calm and cooperative ) Does patient have access to weapons?: No Criminal Charges Pending?: No Does patient have a court date: No  Psychosis Hallucinations: None noted Delusions: None noted  Mental Status Report Appear/Hygiene: Disheveled Eye Contact: Good Motor Activity: Freedom of movement Speech: Logical/coherent Level of Consciousness: Alert Mood: Depressed Affect: Appropriate to circumstance Anxiety Level: None Thought Processes: Coherent;Relevant Judgement: Unimpaired Orientation: Person;Place;Time;Situation Obsessive Compulsive Thoughts/Behaviors: None  Cognitive Functioning Concentration: Decreased Memory: Recent Intact;Remote Intact IQ: Average Insight: Fair Impulse Control: Poor Appetite: Good Weight Loss:  (none reported) Weight Gain:  (none reported ) Sleep: No Change Vegetative Symptoms: None  ADLScreening [facility] Assessment Services) Patient's cognitive ability adequate to safely complete daily activities?: Yes Patient able to express need for assistance with ADLs?: Yes Independently performs ADLs?: Yes (appropriate for developmental age)  Prior Inpatient Therapy Prior Inpatient Therapy: Yes Prior Therapy Dates:  ([facility]-pt unable to recall dates) Prior Therapy Facilty/Provider(s):  ([facility]) Reason for Treatment:  (none reported )  Prior Outpatient Therapy Prior Outpatient Therapy: Yes Prior Therapy Dates:  (current; Nutritional therapist) Prior Therapy Facilty/Provider(s):  Doctor, hospital with [facility]) Reason for Treatment:  (counseling and medication  managment )  ADL Screening (condition at time of admission) Patient's cognitive ability adequate to safely complete daily activities?: Yes Is the patient deaf or have  difficulty hearing?: No Does the patient have difficulty seeing, even when wearing glasses/contacts?: No Does the patient have difficulty concentrating, remembering, or making decisions?: No Patient able to express need for assistance with ADLs?: Yes Does the patient have difficulty dressing or bathing?: No Independently performs ADLs?: Yes (appropriate for developmental age) Does the patient have difficulty walking or climbing stairs?: No Weakness of Legs: None Weakness of Arms/Hands: None  Home Assistive Devices/Equipment Home Assistive Devices/Equipment: None    Abuse/Neglect Assessment (Assessment to be complete while patient is alone) Physical Abuse: Denies Verbal Abuse: Denies Sexual Abuse: Denies Exploitation of patient/patient's resources: Denies Self-Neglect: Denies Values / Beliefs Cultural Requests During Hospitalization: None Spiritual Requests During Hospitalization: None   Advance Directives (For Healthcare) Does patient have an advance directive?: No Nutrition Screen- [place] [facility] Patient's home diet: Regular  Additional Information 1:1 In Past 12 Months?: No CIRT Risk: No Elopement Risk: No Does patient have medical clearance?: No     Disposition:  Disposition Initial Assessment Completed for this Encounter: Yes Disposition of Patient: Inpatient treatment program Type of inpatient treatment program: Adult  Patient requesting to go to inpatient treatment at [facility]. TTS faxed referral to [facility]. Patient also understands that if [facility] does not have a bed available she may be placed at another hospital such as [facility].  On Site Evaluation by:   Reviewed with Physician:    [facility] 05/29/2014 4:36 AM

## 2014-05-29 NOTE — Progress Notes (Signed)
IUD Insertion Procedure Note  Pre-operative Diagnosis: Requests a LARC  Post-operative Diagnosis: same  Indications: contraception  Procedure Details  Urine pregnancy test was done and result was negative.  The risks (including infection, bleeding, pain, and uterine perforation) and benefits of the procedure were explained to the patient and Written informed consent was obtained.    Cervix cleansed with Betadine. Uterus sounded to 8 cm. IUD inserted without difficulty. String visible and trimmed. Patient tolerated procedure well.  IUD Information: Mirena.  Condition: Stable  Complications: None  Plan:  The patient was advised to call for any fever or for prolonged or severe pain or bleeding. She was advised to use OTC analgesics as needed for mild to moderate pain.

## 2014-05-29 NOTE — Progress Notes (Signed)
CSW received call from pt's therapist, Rosary Lively (consent on chart). Therapist relayed her concern about pt's recent behaviors, including her six recent suicide attempts. Therapist relayed her psychosocial stressors--eviction in September, catarect surgery, relationship with parents, caring for child. Therapist stated that pt likely has dx of borderline personality disorder, and possibly a dissociative disorder. Therapist requested that inpatient treatment be sought, or pt at least be kept over the weekend until pt could be placed into an IOP. CSW passed these concerns along to NP  CSW discussed inpatient placement and IOP with pt, and pt agreeable. CSW placed call to IOP coordinator and left message.  Rochele Pages,     ED CSW  phone: [phone] 2:24pm

## 2014-05-30 ENCOUNTER — Encounter (HOSPITAL_COMMUNITY): Payer: Self-pay | Admitting: Psychiatry

## 2014-05-30 LAB — CBG MONITORING, ED
GLUCOSE-CAPILLARY: 149 mg/dL — AB (ref 70–99)
GLUCOSE-CAPILLARY: 222 mg/dL — AB (ref 70–99)
Glucose-Capillary: 128 mg/dL — ABNORMAL HIGH (ref 70–99)
Glucose-Capillary: 213 mg/dL — ABNORMAL HIGH (ref 70–99)
Glucose-Capillary: 109 mg/dL — ABNORMAL HIGH (ref 70–99)

## 2014-05-30 MED ORDER — INSULIN ASPART 100 UNIT/ML ~~LOC~~ SOLN
8.0000 [IU] | Freq: Three times a day (TID) | SUBCUTANEOUS | Status: DC
Start: 2014-05-30 — End: 2014-05-31
  Administered 2014-05-30 – 2014-05-31 (×4): 8 [IU] via SUBCUTANEOUS
  Filled 2014-05-30 (×3): qty 1

## 2014-05-30 MED ORDER — WHITE PETROLATUM GEL
Status: DC | PRN
Start: 2014-05-30 — End: 2014-05-31

## 2014-05-30 NOTE — Progress Notes (Addendum)
CSW filed CPS report with worker Industrial/product designer.  Rochele Pages,     ED CSW  phone: [phone] 2:52pm

## 2014-05-30 NOTE — ED Notes (Signed)
Up to the dek on the phone

## 2014-05-30 NOTE — BH Assessment (Signed)
Per Lavell Luster, [facility], adult unit is currently at capacity. Contacted the following facilities for placement:  INFORMATION RECEIVED, AWAITING RESPONSE: [facility], per [facility], per Juliann Pulse  BED AVAILABLE, FAXED CLINICAL INFORMATION: [facility], per [facility], per [employer]  AT CAPACITY: [facility], per [facility], per [facility] Mates, per [facility], per [facility], per [facility], per [place], per [facility], per [facility], per [facility], per [facility], per [facility], per Manuela Schwartz  PT DECLINED: Ellin Mayhew  NO RESPONSE: [facility]   [place], [place], [facility] Triage Specialist [phone]

## 2014-05-30 NOTE — ED Notes (Signed)
Up on the phone 

## 2014-05-30 NOTE — ED Notes (Signed)
Up to the bathroom 

## 2014-05-30 NOTE — ED Notes (Signed)
Up to the desk on the phone 

## 2014-05-30 NOTE — Consult Note (Signed)
[facility] Face-to-Face Psychiatry Consult   Reason for Consult:  Overdose Referring Physician:  EDP  [name] is an 28 y.o. female. Total Time spent with patient: 20 minutes  Assessment: AXIS I:  Generalized Anxiety Disorder and Major Depression, Recurrent severe AXIS II:  Cluster B Traits AXIS III:   Past Medical History  Diagnosis Date  . Asthma   . Tachycardia     baseline tachycardia   . Retinopathy due to secondary diabetes   . Diabetic gastroparesis   . 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 Diabetic neuropathy, type I diabetes mellitus   . Polyneuropathy in diabetes(357.2)   . Anxiety   . Hypertension   . Depression   . Asthma 11/10/2013  . Headache(784.0)   . Kidney stones    AXIS IV:  other psychosocial or environmental problems, problems related to social environment and problems with primary support group AXIS V:  21-30 behavior considerably influenced by delusions or hallucinations OR serious impairment in judgment, communication OR inability to function in almost all areas  Plan:  Recommend psychiatric Inpatient admission when medically cleared.Dr. Tenny Craw assessed the patient and concurs with the plan.  Subjective:   [name] is a 28 y.o. female patient admitted with suicide attempt.  HPI:  Patient remains a risk to herself with multiple spontaneous overdoses.  Her therapist and parents want her to get help.  Meanwhile, her parents are in the process of getting emergency custody papers of her daughter for safety concerns.  The patient has not made good decisions recently and her child has been at risk.  The patient is agreeable to IOP at [facility] after stabilization. HPI Elements:   Location:  generalized. Quality:  acute. Severity:  severe. Timing:  constant. Duration:  couple of days. Context:  stressors.  Past Psychiatric History: Past Medical History  Diagnosis Date  . Asthma   . Tachycardia     baseline  tachycardia   . Retinopathy due to secondary diabetes   . Diabetic gastroparesis   . 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 Diabetic neuropathy, type I diabetes mellitus   . Polyneuropathy in diabetes(357.2)   . Anxiety   . Hypertension   . Depression   . Asthma 11/10/2013  . Headache(784.0)   . Kidney stones     reports that she has never smoked. She has never used smokeless tobacco. She reports that she does not drink alcohol or use illicit drugs. Family History  Problem Relation Age of Onset  . Hypertension     Family History Substance Abuse: No Family Supports: Yes, List: (parents) Living Arrangements: Other (Comment);Children (lives with 68 yr old daughter) Can pt return to current living arrangement?: Yes Abuse/Neglect [facility]) Physical Abuse: Denies Verbal Abuse: Denies Sexual Abuse: Denies Allergies:   Allergies  Allergen Reactions  . Ceftin Rash    ACT Assessment Complete:  Yes:    Educational Status    Risk to Self: Risk to self with the past 6 months Suicidal Ideation: No-Not Currently/Within Last 6 Months Suicidal Intent: Yes-Currently Present Is patient at risk for suicide?: Yes Suicidal Plan?: Yes-Currently Present Specify Current Suicidal Plan:  (patient overdosed prior to arrival) Access to Means: Yes Specify Access to Suicidal Means:  (access to RX medication-metoprolol) What has been your use of drugs/alcohol within the last 12 months?:  (socially drinks alcohol ) Previous Attempts/Gestures: Yes How many times?:  (3x's-overdoses; 1x-tried to jump out  window) Other Self Harm Risks:  (none reported ) Triggers for Past Attempts: Other (Comment) (depression, impulsivity, irritability ) Intentional Self Injurious Behavior: None Family Suicide History: No Recent stressful life event(s): Other (Comment) (must be out of apt by 06/09/2014; stick parents ) Persecutory voices/beliefs?: No Depression: Yes Depression Symptoms:  Feeling angry/irritable;Feeling worthless/self pity;Loss of interest in usual pleasures;Guilt;Insomnia;Despondent;Tearfulness;Fatigue;Isolating Substance abuse history and/or treatment for substance abuse?: No Suicide prevention information given to non-admitted patients: Not applicable  Risk to Others: Risk to Others within the past 6 months Homicidal Ideation: No Thoughts of Harm to Others: No Current Homicidal Intent: No Current Homicidal Plan: No Access to Homicidal Means: No Identified Victim:  (n/a) History of harm to others?: No Assessment of Violence: None Noted Violent Behavior Description:  (patient is calm and cooperative ) Does patient have access to weapons?: No Criminal Charges Pending?: No Does patient have a court date: No  Abuse: Abuse/Neglect Assessment (Assessment to be complete while patient is alone) Physical Abuse: Denies Verbal Abuse: Denies Sexual Abuse: Denies Exploitation of patient/patient's resources: Denies Self-Neglect: Denies  Prior Inpatient Therapy: Prior Inpatient Therapy Prior Inpatient Therapy: Yes Prior Therapy Dates:  ([facility]-pt unable to recall dates) Prior Therapy Facilty/Provider(s):  ([facility]) Reason for Treatment:  (none reported )  Prior Outpatient Therapy: Prior Outpatient Therapy Prior Outpatient Therapy: Yes Prior Therapy Dates:  (current; Nutritional therapist) Prior Therapy Facilty/Provider(s):  Doctor, hospital with [facility] [facility]) Reason for Treatment:  (counseling and medication managment )  Additional Information: Additional Information 1:1 In Past 12 Months?: No CIRT Risk: No Elopement Risk: No Does patient have medical clearance?: No                  Objective: Blood pressure 118/72, pulse 78, temperature 97.8 F (36.6 C), temperature source Oral, resp. rate 16, last menstrual period 05/27/2014, SpO2 98.00%.There is no weight on file to calculate BMI. Results for orders placed during the hospital  encounter of 05/28/14 (from the past 72 hour(s))  CBG MONITORING, ED     Status: Abnormal   Collection Time    05/28/14 11:08 PM      Result Value Ref Range   Glucose-Capillary 437 (*) 70 - 99 mg/dL  ACETAMINOPHEN LEVEL     Status: None   Collection Time    05/28/14 11:39 PM      Result Value Ref Range   Acetaminophen (Tylenol), Serum <15.0  10 - 30 ug/mL   Comment:            THERAPEUTIC CONCENTRATIONS VARY     SIGNIFICANTLY. A RANGE OF 10-30     ug/mL MAY BE AN EFFECTIVE     CONCENTRATION FOR MANY PATIENTS.     HOWEVER, SOME ARE BEST TREATED     AT CONCENTRATIONS OUTSIDE THIS     RANGE.     ACETAMINOPHEN CONCENTRATIONS     >150 ug/mL AT 4 HOURS AFTER     INGESTION AND >50 ug/mL AT 12     HOURS AFTER INGESTION ARE     OFTEN ASSOCIATED WITH TOXIC     REACTIONS.  CBC WITH DIFFERENTIAL     Status: Abnormal   Collection Time    05/28/14 11:39 PM      Result Value Ref Range   WBC 9.0  4.0 - 10.5 K/uL   RBC 4.44  3.87 - 5.11 MIL/uL   Hemoglobin 12.5  12.0 - 15.0 g/dL   HCT 37.2  36.0 - 46.0 %   MCV 83.8  78.0 -  100.0 fL   MCH 28.2  26.0 - 34.0 pg   MCHC 33.6  30.0 - 36.0 g/dL   RDW 13.3  11.5 - 15.5 %   Platelets 346  150 - 400 K/uL   Neutrophils Relative % 59  43 - 77 %   Neutro Abs 5.3  1.7 - 7.7 K/uL   Lymphocytes Relative 28  12 - 46 %   Lymphs Abs 2.5  0.7 - 4.0 K/uL   Monocytes Relative 6  3 - 12 %   Monocytes Absolute 0.6  0.1 - 1.0 K/uL   Eosinophils Relative 6 (*) 0 - 5 %   Eosinophils Absolute 0.5  0.0 - 0.7 K/uL   Basophils Relative 1  0 - 1 %   Basophils Absolute 0.1  0.0 - 0.1 K/uL  COMPREHENSIVE METABOLIC PANEL     Status: Abnormal   Collection Time    05/28/14 11:39 PM      Result Value Ref Range   Sodium 132 (*) 137 - 147 mEq/L   Potassium 4.9  3.7 - 5.3 mEq/L   Chloride 95 (*) 96 - 112 mEq/L   CO2 26  19 - 32 mEq/L   Glucose, Bld 544 (*) 70 - 99 mg/dL   BUN 17  6 - 23 mg/dL   Creatinine, Ser 1.02  0.50 - 1.10 mg/dL   Calcium 9.0  8.4 - 10.5 mg/dL    Total Protein 6.6  6.0 - 8.3 g/dL   Albumin 3.2 (*) 3.5 - 5.2 g/dL   AST 11  0 - 37 U/L   ALT 9  0 - 35 U/L   Alkaline Phosphatase 137 (*) 39 - 117 U/L   Total Bilirubin 0.2 (*) 0.3 - 1.2 mg/dL   GFR calc non Af Amer 74 (*) >90 mL/min   GFR calc Af Amer 85 (*) >90 mL/min   Comment: (NOTE)     The eGFR has been calculated using the CKD EPI equation.     This calculation has not been validated in all clinical situations.     eGFR's persistently <90 mL/min signify possible Chronic Kidney     Disease.   Anion gap 11  5 - 15  SALICYLATE LEVEL     Status: Abnormal   Collection Time    05/28/14 11:39 PM      Result Value Ref Range   Salicylate Lvl <9.5 (*) 2.8 - 20.0 mg/dL  ETHANOL     Status: None   Collection Time    05/28/14 11:39 PM      Result Value Ref Range   Alcohol, Ethyl (B) <11  0 - 11 mg/dL   Comment:            LOWEST DETECTABLE LIMIT FOR     SERUM ALCOHOL IS 11 mg/dL     FOR MEDICAL PURPOSES ONLY  URINE RAPID DRUG SCREEN (HOSP PERFORMED)     Status: None   Collection Time    05/29/14 12:09 AM      Result Value Ref Range   Opiates NONE DETECTED  NONE DETECTED   Cocaine NONE DETECTED  NONE DETECTED   Benzodiazepines NONE DETECTED  NONE DETECTED   Amphetamines NONE DETECTED  NONE DETECTED   Tetrahydrocannabinol NONE DETECTED  NONE DETECTED   Barbiturates NONE DETECTED  NONE DETECTED   Comment:            DRUG SCREEN FOR MEDICAL PURPOSES     ONLY.  IF CONFIRMATION IS NEEDED  FOR ANY PURPOSE, NOTIFY LAB     WITHIN 5 DAYS.                LOWEST DETECTABLE LIMITS     FOR URINE DRUG SCREEN     Drug Class       Cutoff (ng/mL)     Amphetamine      1000     Barbiturate      200     Benzodiazepine   376     Tricyclics       283     Opiates          300     Cocaine          300     THC              50  URINALYSIS, ROUTINE W REFLEX MICROSCOPIC     Status: Abnormal   Collection Time    05/29/14 12:09 AM      Result Value Ref Range   Color, Urine YELLOW  YELLOW    APPearance CLEAR  CLEAR   Specific Gravity, Urine 1.036 (*) 1.005 - 1.030   pH 5.5  5.0 - 8.0   Glucose, UA >1000 (*) NEGATIVE mg/dL   Hgb urine dipstick SMALL (*) NEGATIVE   Bilirubin Urine NEGATIVE  NEGATIVE   Ketones, ur NEGATIVE  NEGATIVE mg/dL   Protein, ur 30 (*) NEGATIVE mg/dL   Urobilinogen, UA 0.2  0.0 - 1.0 mg/dL   Nitrite NEGATIVE  NEGATIVE   Leukocytes, UA NEGATIVE  NEGATIVE  URINE MICROSCOPIC-ADD ON     Status: None   Collection Time    05/29/14 12:09 AM      Result Value Ref Range   Squamous Epithelial / LPF RARE  RARE   RBC / HPF 3-6  <3 RBC/hpf  POC URINE PREG, ED     Status: None   Collection Time    05/29/14 12:20 AM      Result Value Ref Range   Preg Test, Ur NEGATIVE  NEGATIVE   Comment:            THE SENSITIVITY OF THIS     METHODOLOGY IS >24 mIU/mL  CBG MONITORING, ED     Status: Abnormal   Collection Time    05/29/14  4:52 AM      Result Value Ref Range   Glucose-Capillary 309 (*) 70 - 99 mg/dL  CBG MONITORING, ED     Status: Abnormal   Collection Time    05/29/14  7:21 AM      Result Value Ref Range   Glucose-Capillary 174 (*) 70 - 99 mg/dL  CBG MONITORING, ED     Status: Abnormal   Collection Time    05/29/14 12:19 PM      Result Value Ref Range   Glucose-Capillary 100 (*) 70 - 99 mg/dL  CBG MONITORING, ED     Status: Abnormal   Collection Time    05/29/14  6:00 PM      Result Value Ref Range   Glucose-Capillary 294 (*) 70 - 99 mg/dL  CBG MONITORING, ED     Status: Abnormal   Collection Time    05/29/14  9:08 PM      Result Value Ref Range   Glucose-Capillary 220 (*) 70 - 99 mg/dL  CBG MONITORING, ED     Status: Abnormal   Collection Time    05/30/14  6:34 AM      Result  Value Ref Range   Glucose-Capillary 149 (*) 70 - 99 mg/dL  CBG MONITORING, ED     Status: Abnormal   Collection Time    05/30/14  8:26 AM      Result Value Ref Range   Glucose-Capillary 128 (*) 70 - 99 mg/dL   Labs are reviewed and are pertinent for no medical  issues noted.  Current Facility-Administered Medications  Medication Dose Route Frequency Provider Last Rate Last Dose  . 0.9 %  sodium chloride infusion  1,000 mL Intravenous Continuous Kalman Drape, MD   1,000 mL at 05/29/14 0118  . acetaminophen (TYLENOL) tablet 650 mg  650 mg Oral Q4H PRN Kalman Drape, MD   650 mg at 05/29/14 1811  . albuterol (PROVENTIL HFA;VENTOLIN HFA) 108 (90 BASE) MCG/ACT inhaler 2 puff  2 puff Inhalation Q6H PRN Kalman Drape, MD      . alum & mag hydroxide-simeth (MAALOX/MYLANTA) 200-200-20 MG/5ML suspension 30 mL  30 mL Oral PRN Kalman Drape, MD      . ARIPiprazole (ABILIFY) tablet 20 mg  20 mg Oral Daily Kalman Drape, MD   20 mg at 05/29/14 2101  . brimonidine (ALPHAGAN) 0.2 % ophthalmic solution 1 drop  1 drop Both Eyes BID Kalman Drape, MD   1 drop at 05/30/14 0846  . busPIRone (BUSPAR) tablet 30 mg  30 mg Oral BID Kalman Drape, MD   30 mg at 05/30/14 1048  . clonazePAM (KLONOPIN) tablet 0.5 mg  0.5 mg Oral BID PRN Kalman Drape, MD   0.5 mg at 05/29/14 2102  . DULoxetine (CYMBALTA) DR capsule 60 mg  60 mg Oral Daily Kalman Drape, MD   60 mg at 05/30/14 1048  . hydrOXYzine (ATARAX/VISTARIL) tablet 50 mg  50 mg Oral BID PRN Kalman Drape, MD      . ibuprofen (ADVIL,MOTRIN) tablet 600 mg  600 mg Oral Q8H PRN Kalman Drape, MD      . insulin aspart (novoLOG) injection 0-15 Units  0-15 Units Subcutaneous TID WC Kalman Drape, MD   2 Units at 05/30/14 [phone]  . insulin aspart (novoLOG) injection 4 Units  4 Units Subcutaneous TID WC Kalman Drape, MD   4 Units at 05/30/14 [phone]  . insulin glargine (LANTUS) injection 38 Units  38 Units Subcutaneous QHS Kalman Drape, MD   38 Units at 05/29/14 2115  . lisinopril (PRINIVIL,ZESTRIL) tablet 10 mg  10 mg Oral q morning - 10a Kalman Drape, MD   10 mg at 05/30/14 1048  . mometasone-formoterol (DULERA) 200-5 MCG/ACT inhaler 2 puff  2 puff Inhalation BID Kalman Drape, MD   2 puff at 05/30/14 [phone]  . montelukast (SINGULAIR) tablet 10 mg  10  mg Oral QHS Kalman Drape, MD   10 mg at 05/29/14 2144  . nicotine (NICODERM CQ - dosed in mg/24 hours) patch 21 mg  21 mg Transdermal Daily Kalman Drape, MD      . ondansetron [facility]) tablet 4 mg  4 mg Oral Q8H PRN Kalman Drape, MD      . terbinafine (LAMISIL) tablet 250 mg  250 mg Oral Daily Kalman Drape, MD   250 mg at 05/30/14 1048  . timolol (TIMOPTIC) 0.5 % ophthalmic solution 1 drop  1 drop Both Eyes BID Kalman Drape, MD   1 drop at 05/30/14 0845  . traZODone (DESYREL) tablet 50 mg  50 mg Oral QHS  Kalman Drape, MD   50 mg at 05/29/14 2102  . zolpidem (AMBIEN) tablet 5 mg  5 mg Oral QHS PRN Kalman Drape, MD       Current Outpatient Prescriptions  Medication Sig Dispense Refill  . albuterol (PROVENTIL HFA;VENTOLIN HFA) 108 (90 BASE) MCG/ACT inhaler Inhale 2 puffs into the lungs every 6 (six) hours as needed. As needed for shortness of breath.  1 Inhaler  11  . ARIPiprazole (ABILIFY) 20 MG tablet Take 20 mg by mouth daily.      . brimonidine-timolol (COMBIGAN) 0.2-0.5 % ophthalmic solution Place 1 drop into both eyes every 12 (twelve) hours.      . busPIRone (BUSPAR) 15 MG tablet Take 30 mg by mouth 2 (two) times daily.      . clonazePAM (KLONOPIN) 0.5 MG tablet Take 1 tablet (0.5 mg total) by mouth 2 (two) times daily as needed (anxiety).  30 tablet  0  . DULoxetine (CYMBALTA) 60 MG capsule Take 1 capsule (60 mg total) by mouth daily.  7 capsule  0  . fluticasone-salmeterol (ADVAIR HFA) 230-21 MCG/ACT inhaler Inhale 1 puff into the lungs 2 (two) times daily.  1 Inhaler  12  . hydrOXYzine (ATARAX/VISTARIL) 50 MG tablet Take 1 tablet (50 mg total) by mouth 2 (two) times daily as needed for anxiety.  14 tablet  0  . ibuprofen (ADVIL,MOTRIN) 200 MG tablet Take 600-800 mg by mouth every 6 (six) hours as needed for moderate pain.      . Insulin Human (INSULIN PUMP) SOLN Inject 1 each into the skin continuous. HUMALOG.      Marland Kitchen lisinopril (PRINIVIL,ZESTRIL) 10 MG tablet Take 1 tablet (10 mg total) by  mouth every morning.  90 tablet  1  . metoprolol tartrate (LOPRESSOR) 25 MG tablet Take 25 mg by mouth as needed (tachycardia).       . montelukast (SINGULAIR) 10 MG tablet Take 1 tablet (10 mg total) by mouth at bedtime.      . Prenatal Vit-Fe Fumarate-FA (PRENATAL MULTIVITAMIN) TABS tablet Take 1 tablet by mouth daily at 12 noon.      . terbinafine (LAMISIL) 250 MG tablet Take 1 tablet (250 mg total) by mouth daily.  30 tablet  2  . traZODone (DESYREL) 50 MG tablet Take 50 mg by mouth at bedtime.        Psychiatric Specialty Exam:     Blood pressure 118/72, pulse 78, temperature 97.8 F (36.6 C), temperature source Oral, resp. rate 16, last menstrual period 05/27/2014, SpO2 98.00%.There is no weight on file to calculate BMI.  General Appearance: Casual  Eye Contact::  Fair  Speech:  Normal Rate  Volume:  Normal  Mood:  Anxious, Depressed and Hopeless  Affect:  Congruent  Thought Process:  Clear, coherent, logical  Orientation:  Full (Time, Place, and Person)  Thought Content:  Rumination  Suicidal Thoughts:  Yes.  with intent/plan  Homicidal Thoughts:  No  Memory:  Immediate;   Fair Recent;   Fair Remote;   Fair  Judgement:  Poor  Insight:  Lacking  Psychomotor Activity:  Decreased  Concentration:  Fair  Recall:  [place]: Fair  Akathisia:  No  Handed:  Left  AIMS (if indicated):     Assets:  Leisure Time Physical Health Resilience Social Support Transportation  Sleep:      Musculoskeletal: Strength & Muscle Tone: within normal limits Gait & Station: normal Patient leans: N/A  Treatment Plan  Summary: Daily contact with patient to assess and evaluate symptoms and progress in treatment Medication management; admit to inpatient psychiatry unit for stabilization.  Waylan Boga, [place] 05/30/2014 12:14 PM Pt seen and I agree with assessment and plan Levonne Spiller MD

## 2014-05-30 NOTE — ED Notes (Signed)
CSW into see 

## 2014-05-30 NOTE — ED Notes (Signed)
Up to the desk 

## 2014-05-30 NOTE — ED Notes (Signed)
Up tot he bathroom to shower and change scrubs 

## 2014-05-31 ENCOUNTER — Encounter (HOSPITAL_COMMUNITY): Payer: Self-pay | Admitting: *Deleted

## 2014-05-31 ENCOUNTER — Inpatient Hospital Stay (HOSPITAL_COMMUNITY)
Admission: AD | Admit: 2014-05-31 | Discharge: 2014-06-03 | DRG: 885 | Disposition: A | Discharge status: Home / Self Care | Payer: 59 | Source: Intra-hospital | Attending: Psychiatry | Admitting: Psychiatry

## 2014-05-31 ENCOUNTER — Encounter (HOSPITAL_COMMUNITY): Payer: Self-pay | Admitting: Registered Nurse

## 2014-05-31 DIAGNOSIS — F410 Panic disorder [episodic paroxysmal anxiety] without agoraphobia: Secondary | ICD-10-CM | POA: Diagnosis present

## 2014-05-31 DIAGNOSIS — J45909 Unspecified asthma, uncomplicated: Secondary | ICD-10-CM | POA: Diagnosis present

## 2014-05-31 DIAGNOSIS — E11319 Type 2 diabetes mellitus with unspecified diabetic retinopathy without macular edema: Secondary | ICD-10-CM | POA: Diagnosis present

## 2014-05-31 DIAGNOSIS — E1039 Type 1 diabetes mellitus with other diabetic ophthalmic complication: Secondary | ICD-10-CM | POA: Diagnosis present

## 2014-05-31 DIAGNOSIS — F322 Major depressive disorder, single episode, severe without psychotic features: Secondary | ICD-10-CM | POA: Diagnosis present

## 2014-05-31 DIAGNOSIS — K3184 Gastroparesis: Secondary | ICD-10-CM | POA: Diagnosis present

## 2014-05-31 DIAGNOSIS — B351 Tinea unguium: Secondary | ICD-10-CM

## 2014-05-31 DIAGNOSIS — F411 Generalized anxiety disorder: Secondary | ICD-10-CM | POA: Diagnosis present

## 2014-05-31 DIAGNOSIS — I10 Essential (primary) hypertension: Secondary | ICD-10-CM | POA: Diagnosis present

## 2014-05-31 DIAGNOSIS — E1142 Type 2 diabetes mellitus with diabetic polyneuropathy: Secondary | ICD-10-CM | POA: Diagnosis present

## 2014-05-31 DIAGNOSIS — E1049 Type 1 diabetes mellitus with other diabetic neurological complication: Secondary | ICD-10-CM | POA: Diagnosis present

## 2014-05-31 DIAGNOSIS — Z794 Long term (current) use of insulin: Secondary | ICD-10-CM

## 2014-05-31 DIAGNOSIS — Z9641 Presence of insulin pump (external) (internal): Secondary | ICD-10-CM | POA: Diagnosis not present

## 2014-05-31 DIAGNOSIS — T50902A Poisoning by unspecified drugs, medicaments and biological substances, intentional self-harm, initial encounter: Secondary | ICD-10-CM

## 2014-05-31 LAB — CBG MONITORING, ED
GLUCOSE-CAPILLARY: 121 mg/dL — AB (ref 70–99)
Glucose-Capillary: 163 mg/dL — ABNORMAL HIGH (ref 70–99)
Glucose-Capillary: 93 mg/dL (ref 70–99)
Glucose-Capillary: 279 mg/dL — ABNORMAL HIGH (ref 70–99)

## 2014-05-31 MED ORDER — INSULIN GLARGINE 100 UNIT/ML ~~LOC~~ SOLN
38.0000 [IU] | Freq: Every day | SUBCUTANEOUS | Status: DC
  Administered 2014-06-01 – 2014-06-02 (×2): 38 [IU] via SUBCUTANEOUS

## 2014-05-31 MED ORDER — DOCUSATE SODIUM 100 MG PO CAPS
100.0000 mg | ORAL_CAPSULE | Freq: Once | ORAL | Status: AC
  Administered 2014-05-31: 100 mg via ORAL
  Filled 2014-05-31: qty 1

## 2014-05-31 MED ORDER — DULOXETINE HCL 60 MG PO CPEP
60.0000 mg | ORAL_CAPSULE | Freq: Every day | ORAL | Status: DC
  Administered 2014-06-01 – 2014-06-03 (×3): 60 mg via ORAL
  Filled 2014-05-31 (×5): qty 1
  Filled 2014-05-31: qty 3

## 2014-05-31 MED ORDER — NICOTINE 21 MG/24HR TD PT24
21.0000 mg | MEDICATED_PATCH | Freq: Every day | TRANSDERMAL | Status: DC
  Filled 2014-05-31 (×3): qty 1

## 2014-05-31 MED ORDER — WHITE PETROLATUM GEL: Status: DC | PRN

## 2014-05-31 MED ORDER — ALBUTEROL SULFATE HFA 108 (90 BASE) MCG/ACT IN AERS: 2.0000 | INHALATION_SPRAY | Freq: Four times a day (QID) | RESPIRATORY_TRACT | Status: DC | PRN

## 2014-05-31 MED ORDER — INSULIN ASPART 100 UNIT/ML ~~LOC~~ SOLN
8.0000 [IU] | Freq: Three times a day (TID) | SUBCUTANEOUS | Status: DC
  Administered 2014-06-01 – 2014-06-03 (×8): 8 [IU] via SUBCUTANEOUS

## 2014-05-31 MED ORDER — BUSPIRONE HCL 15 MG PO TABS
30.0000 mg | ORAL_TABLET | Freq: Two times a day (BID) | ORAL | Status: DC
  Administered 2014-06-01 – 2014-06-03 (×5): 30 mg via ORAL
  Filled 2014-05-31: qty 12
  Filled 2014-05-31 (×6): qty 2
  Filled 2014-05-31: qty 12
  Filled 2014-05-31: qty 2

## 2014-05-31 MED ORDER — TRAZODONE HCL 50 MG PO TABS
50.0000 mg | ORAL_TABLET | Freq: Every day | ORAL | Status: DC
  Administered 2014-05-31 – 2014-06-02 (×3): 50 mg via ORAL
  Filled 2014-05-31 (×3): qty 1
  Filled 2014-05-31: qty 3
  Filled 2014-05-31 (×2): qty 1

## 2014-05-31 MED ORDER — INSULIN ASPART 100 UNIT/ML ~~LOC~~ SOLN
0.0000 [IU] | Freq: Three times a day (TID) | SUBCUTANEOUS | Status: DC
  Administered 2014-06-01: 2 [IU] via SUBCUTANEOUS
  Administered 2014-06-01 (×2): 8 [IU] via SUBCUTANEOUS
  Administered 2014-06-02: 5 [IU] via SUBCUTANEOUS
  Administered 2014-06-02: 3 [IU] via SUBCUTANEOUS
  Administered 2014-06-02: 5 [IU] via SUBCUTANEOUS
  Administered 2014-06-03: 2 [IU] via SUBCUTANEOUS
  Administered 2014-06-03: 3 [IU] via SUBCUTANEOUS

## 2014-05-31 MED ORDER — MOMETASONE FURO-FORMOTEROL FUM 200-5 MCG/ACT IN AERO
2.0000 | INHALATION_SPRAY | Freq: Two times a day (BID) | RESPIRATORY_TRACT | Status: DC
  Administered 2014-06-01 – 2014-06-03 (×5): 2 via RESPIRATORY_TRACT
  Filled 2014-05-31: qty 8.8

## 2014-05-31 MED ORDER — MONTELUKAST SODIUM 10 MG PO TABS
10.0000 mg | ORAL_TABLET | Freq: Every day | ORAL | Status: DC
  Administered 2014-06-01 – 2014-06-02 (×2): 10 mg via ORAL
  Filled 2014-05-31 (×5): qty 1

## 2014-05-31 MED ORDER — TERBINAFINE HCL 250 MG PO TABS
250.0000 mg | ORAL_TABLET | Freq: Every day | ORAL | Status: DC
  Administered 2014-06-01 – 2014-06-03 (×3): 250 mg via ORAL
  Filled 2014-05-31 (×5): qty 1

## 2014-05-31 MED ORDER — ARIPIPRAZOLE 10 MG PO TABS
20.0000 mg | ORAL_TABLET | Freq: Every day | ORAL | Status: DC
  Filled 2014-05-31 (×2): qty 2

## 2014-05-31 MED ORDER — MAGNESIUM HYDROXIDE 400 MG/5ML PO SUSP: 30.0000 mL | Freq: Every day | ORAL | Status: DC | PRN

## 2014-05-31 MED ORDER — CLONAZEPAM 0.5 MG PO TABS
0.5000 mg | ORAL_TABLET | Freq: Two times a day (BID) | ORAL | Status: DC | PRN
  Administered 2014-05-31 – 2014-06-02 (×3): 0.5 mg via ORAL
  Filled 2014-05-31 (×4): qty 1

## 2014-05-31 MED ORDER — HYDROXYZINE HCL 50 MG PO TABS
50.0000 mg | ORAL_TABLET | Freq: Two times a day (BID) | ORAL | Status: DC | PRN
  Administered 2014-06-01 – 2014-06-02 (×2): 50 mg via ORAL
  Filled 2014-05-31: qty 1
  Filled 2014-05-31: qty 6
  Filled 2014-05-31: qty 1

## 2014-05-31 MED ORDER — BRIMONIDINE TARTRATE 0.2 % OP SOLN
1.0000 [drp] | Freq: Two times a day (BID) | OPHTHALMIC | Status: DC
  Administered 2014-06-01 – 2014-06-03 (×5): 1 [drp] via OPHTHALMIC
  Filled 2014-05-31: qty 5

## 2014-05-31 NOTE — Tx Team (Signed)
Initial Interdisciplinary Treatment Plan   PATIENT STRESSORS: Pt reports she is losing her home on 06/09/14   PROBLEM LIST: Problem List/Patient Goals Date to be addressed Date deferred Reason deferred Estimated date of resolution  "Controlling my impulses" 05/31/14     "Getting suicidal thoughts out of my mind completely" 05/31/14                                                DISCHARGE CRITERIA:  Improved stabilization in mood, thinking, and/or behavior  PRELIMINARY DISCHARGE PLAN: Outpatient therapy  PATIENT/FAMIILY INVOLVEMENT: This treatment plan has been presented to and reviewed with the patient, [name].  The patient and family have been given the opportunity to ask questions and make suggestions.  Karie Kirks 05/31/2014, 11:08 PM

## 2014-05-31 NOTE — ED Notes (Signed)
Up in hall waiting for the phone, crying

## 2014-05-31 NOTE — Progress Notes (Signed)
Clinical Social Work  CSW spoke with CPS Adriana Simas) who reports CSW will need to contact CPS at [phone] on 8/24 to determine which DSS SW is assigned case. DSS would like updates on patient's DC plans. DSS SW reports that patient was cooperative during assessment and no further needs at this time.  Sindy Messing, LCSW (Weekend Coverage)

## 2014-05-31 NOTE — ED Notes (Signed)
On the phone 

## 2014-05-31 NOTE — ED Notes (Signed)
Up to the desk on the phone 

## 2014-05-31 NOTE — Consult Note (Signed)
[facility] Follow Up Psychiatry Consult   Reason for Consult:  Overdose Referring Physician:  EDP  [name] is an 28 y.o. female. Total Time spent with patient: 20 minutes  Assessment: AXIS I:  Generalized Anxiety Disorder and Major Depression, Recurrent severe AXIS II:  Cluster B Traits AXIS III:   Past Medical History  Diagnosis Date  . Asthma   . Tachycardia     baseline tachycardia   . Retinopathy due to secondary diabetes   . Diabetic gastroparesis   . 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 Diabetic neuropathy, type I diabetes mellitus   . Polyneuropathy in diabetes(357.2)   . Anxiety   . Hypertension   . Depression   . Asthma 11/10/2013  . Headache(784.0)   . Kidney stones    AXIS IV:  other psychosocial or environmental problems, problems related to social environment and problems with primary support group AXIS V:  21-30 behavior considerably influenced by delusions or hallucinations OR serious impairment in judgment, communication OR inability to function in almost all areas  Plan:  Recommend psychiatric Inpatient admission when medically cleared.Dr. Harrington Challenger assessed the patient and concurs with the plan.  Subjective:   [name] is a 28 y.o. female patient admitted with suicide attempt.  HPI: Patient states that she is feeling better. At this time patient denies suicidal ideation/homicidal ideation.  Patient states "I tried to overdose but it was an impulsive action.  I had a party and got in trouble cause the daughter of the landlord was there and now I might be getting evicted.  I am suppose to have surgery on my eye tomorrow.  I talked with my mom and she is going to let me stay with her." Discussed with patient that she did take an overdose with intention of killing her self.  Continue to recommend inpatient treatment and reschedule the surgery. Consulted with social work and have not been able to contact patient's  mother.    HPI Elements:   Location:  generalized. Quality:  acute. Severity:  severe. Timing:  constant. Duration:  couple of days. Context:  stressors.  Past Psychiatric History: Past Medical History  Diagnosis Date  . Asthma   . Tachycardia     baseline tachycardia   . Retinopathy due to secondary diabetes   . Diabetic gastroparesis   . 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 Diabetic neuropathy, type I diabetes mellitus   . Polyneuropathy in diabetes(357.2)   . Anxiety   . Hypertension   . Depression   . Asthma 11/10/2013  . Headache(784.0)   . Kidney stones     reports that she has never smoked. She has never used smokeless tobacco. She reports that she does not drink alcohol or use illicit drugs. Family History  Problem Relation Age of Onset  . Hypertension     Family History Substance Abuse: No Family Supports: Yes, List: (parents) Living Arrangements: Other (Comment);Children (lives with 73 yr old daughter) Can pt return to current living arrangement?: Yes Abuse/Neglect [facility]) Physical Abuse: Denies Verbal Abuse: Denies Sexual Abuse: Denies Allergies:   Allergies  Allergen Reactions  . Ceftin Rash    ACT Assessment Complete:  Yes:    Educational Status    Risk to Self: Risk to self with the past 6 months Suicidal Ideation: No-Not Currently/Within Last 6 Months Suicidal Intent: Yes-Currently Present Is patient at risk for suicide?: Yes Suicidal Plan?:  Yes-Currently Present Specify Current Suicidal Plan:  (patient overdosed prior to arrival) Access to Means: Yes Specify Access to Suicidal Means:  (access to RX medication-metoprolol) What has been your use of drugs/alcohol within the last 12 months?:  (socially drinks alcohol ) Previous Attempts/Gestures: Yes How many times?:  (3x's-overdoses; 1x-tried to jump out window) Other Self Harm Risks:  (none reported ) Triggers for Past Attempts: Other (Comment) (depression,  impulsivity, irritability ) Intentional Self Injurious Behavior: None Family Suicide History: No Recent stressful life event(s): Other (Comment) (must be out of apt by 06/09/2014; stick parents ) Persecutory voices/beliefs?: No Depression: Yes Depression Symptoms: Feeling angry/irritable;Feeling worthless/self pity;Loss of interest in usual pleasures;Guilt;Insomnia;Despondent;Tearfulness;Fatigue;Isolating Substance abuse history and/or treatment for substance abuse?: No Suicide prevention information given to non-admitted patients: Not applicable  Risk to Others: Risk to Others within the past 6 months Homicidal Ideation: No Thoughts of Harm to Others: No Current Homicidal Intent: No Current Homicidal Plan: No Access to Homicidal Means: No Identified Victim:  (n/a) History of harm to others?: No Assessment of Violence: None Noted Violent Behavior Description:  (patient is calm and cooperative ) Does patient have access to weapons?: No Criminal Charges Pending?: No Does patient have a court date: No  Abuse: Abuse/Neglect Assessment (Assessment to be complete while patient is alone) Physical Abuse: Denies Verbal Abuse: Denies Sexual Abuse: Denies Exploitation of patient/patient's resources: Denies Self-Neglect: Denies  Prior Inpatient Therapy: Prior Inpatient Therapy Prior Inpatient Therapy: Yes Prior Therapy Dates:  ([facility]-pt unable to recall dates) Prior Therapy Facilty/Provider(s):  ([facility]) Reason for Treatment:  (none reported )  Prior Outpatient Therapy: Prior Outpatient Therapy Prior Outpatient Therapy: Yes Prior Therapy Dates:  (current; Nutritional therapist) Prior Therapy Facilty/Provider(s):  Doctor, hospital with [employer] [facility]) Reason for Treatment:  (counseling and medication managment )  Additional Information: Additional Information 1:1 In Past 12 Months?: No CIRT Risk: No Elopement Risk: No Does patient have medical clearance?: No    Family History   Problem Relation Age of Onset  . Hypertension     Review of Systems  Eyes:       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249.50, 362.01)  Musculoskeletal: Negative.   Psychiatric/Behavioral: Positive for depression and suicidal ideas (Denies at this time). Negative for hallucinations. Substance abuse: Denies. The patient is not nervous/anxious.     Objective: Blood pressure 140/86, pulse 80, temperature 97.9 F (36.6 C), temperature source Oral, resp. rate 18, last menstrual period 05/27/2014, SpO2 99.00%.There is no weight on file to calculate BMI. Results for orders placed during the hospital encounter of 05/28/14 (from the past 72 hour(s))  CBG MONITORING, ED     Status: Abnormal   Collection Time    05/28/14 11:08 PM      Result Value Ref Range   Glucose-Capillary 437 (*) 70 - 99 mg/dL  ACETAMINOPHEN LEVEL     Status: None   Collection Time    05/28/14 11:39 PM      Result Value Ref Range   Acetaminophen (Tylenol), Serum <15.0  10 - 30 ug/mL   Comment:            THERAPEUTIC CONCENTRATIONS VARY     SIGNIFICANTLY. A RANGE OF 10-30     ug/mL MAY BE AN EFFECTIVE     CONCENTRATION FOR MANY PATIENTS.     HOWEVER, SOME ARE BEST TREATED     AT CONCENTRATIONS OUTSIDE THIS     RANGE.     ACETAMINOPHEN CONCENTRATIONS     >150 ug/mL AT  4 HOURS AFTER     INGESTION AND >50 ug/mL AT 12     HOURS AFTER INGESTION ARE     OFTEN ASSOCIATED WITH TOXIC     REACTIONS.  CBC WITH DIFFERENTIAL     Status: Abnormal   Collection Time    05/28/14 11:39 PM      Result Value Ref Range   WBC 9.0  4.0 - 10.5 K/uL   RBC 4.44  3.87 - 5.11 MIL/uL   Hemoglobin 12.5  12.0 - 15.0 g/dL   HCT 37.2  36.0 - 46.0 %   MCV 83.8  78.0 - 100.0 fL   MCH 28.2  26.0 - 34.0 pg   MCHC 33.6  30.0 - 36.0 g/dL   RDW 13.3  11.5 - 15.5 %   Platelets 346  150 - 400 K/uL   Neutrophils Relative % 59  43 - 77 %   Neutro Abs 5.3  1.7 - 7.7 K/uL   Lymphocytes Relative 28  12 - 46 %   Lymphs Abs 2.5  0.7 - 4.0 K/uL    Monocytes Relative 6  3 - 12 %   Monocytes Absolute 0.6  0.1 - 1.0 K/uL   Eosinophils Relative 6 (*) 0 - 5 %   Eosinophils Absolute 0.5  0.0 - 0.7 K/uL   Basophils Relative 1  0 - 1 %   Basophils Absolute 0.1  0.0 - 0.1 K/uL  COMPREHENSIVE METABOLIC PANEL     Status: Abnormal   Collection Time    05/28/14 11:39 PM      Result Value Ref Range   Sodium 132 (*) 137 - 147 mEq/L   Potassium 4.9  3.7 - 5.3 mEq/L   Chloride 95 (*) 96 - 112 mEq/L   CO2 26  19 - 32 mEq/L   Glucose, Bld 544 (*) 70 - 99 mg/dL   BUN 17  6 - 23 mg/dL   Creatinine, Ser 1.02  0.50 - 1.10 mg/dL   Calcium 9.0  8.4 - 10.5 mg/dL   Total Protein 6.6  6.0 - 8.3 g/dL   Albumin 3.2 (*) 3.5 - 5.2 g/dL   AST 11  0 - 37 U/L   ALT 9  0 - 35 U/L   Alkaline Phosphatase 137 (*) 39 - 117 U/L   Total Bilirubin 0.2 (*) 0.3 - 1.2 mg/dL   GFR calc non Af Amer 74 (*) >90 mL/min   GFR calc Af Amer 85 (*) >90 mL/min   Comment: (NOTE)     The eGFR has been calculated using the CKD EPI equation.     This calculation has not been validated in all clinical situations.     eGFR's persistently <90 mL/min signify possible Chronic Kidney     Disease.   Anion gap 11  5 - 15  SALICYLATE LEVEL     Status: Abnormal   Collection Time    05/28/14 11:39 PM      Result Value Ref Range   Salicylate Lvl <3.5 (*) 2.8 - 20.0 mg/dL  ETHANOL     Status: None   Collection Time    05/28/14 11:39 PM      Result Value Ref Range   Alcohol, Ethyl (B) <11  0 - 11 mg/dL   Comment:            LOWEST DETECTABLE LIMIT FOR     SERUM ALCOHOL IS 11 mg/dL     FOR MEDICAL PURPOSES ONLY  URINE RAPID DRUG  SCREEN (HOSP PERFORMED)     Status: None   Collection Time    05/29/14 12:09 AM      Result Value Ref Range   Opiates NONE DETECTED  NONE DETECTED   Cocaine NONE DETECTED  NONE DETECTED   Benzodiazepines NONE DETECTED  NONE DETECTED   Amphetamines NONE DETECTED  NONE DETECTED   Tetrahydrocannabinol NONE DETECTED  NONE DETECTED   Barbiturates NONE  DETECTED  NONE DETECTED   Comment:            DRUG SCREEN FOR MEDICAL PURPOSES     ONLY.  IF CONFIRMATION IS NEEDED     FOR ANY PURPOSE, NOTIFY LAB     WITHIN 5 DAYS.                LOWEST DETECTABLE LIMITS     FOR URINE DRUG SCREEN     Drug Class       Cutoff (ng/mL)     Amphetamine      1000     Barbiturate      200     Benzodiazepine   762     Tricyclics       831     Opiates          300     Cocaine          300     THC              50  URINALYSIS, ROUTINE W REFLEX MICROSCOPIC     Status: Abnormal   Collection Time    05/29/14 12:09 AM      Result Value Ref Range   Color, Urine YELLOW  YELLOW   APPearance CLEAR  CLEAR   Specific Gravity, Urine 1.036 (*) 1.005 - 1.030   pH 5.5  5.0 - 8.0   Glucose, UA >1000 (*) NEGATIVE mg/dL   Hgb urine dipstick SMALL (*) NEGATIVE   Bilirubin Urine NEGATIVE  NEGATIVE   Ketones, ur NEGATIVE  NEGATIVE mg/dL   Protein, ur 30 (*) NEGATIVE mg/dL   Urobilinogen, UA 0.2  0.0 - 1.0 mg/dL   Nitrite NEGATIVE  NEGATIVE   Leukocytes, UA NEGATIVE  NEGATIVE  URINE MICROSCOPIC-ADD ON     Status: None   Collection Time    05/29/14 12:09 AM      Result Value Ref Range   Squamous Epithelial / LPF RARE  RARE   RBC / HPF 3-6  <3 RBC/hpf  POC URINE PREG, ED     Status: None   Collection Time    05/29/14 12:20 AM      Result Value Ref Range   Preg Test, Ur NEGATIVE  NEGATIVE   Comment:            THE SENSITIVITY OF THIS     METHODOLOGY IS >24 mIU/mL  CBG MONITORING, ED     Status: Abnormal   Collection Time    05/29/14  4:52 AM      Result Value Ref Range   Glucose-Capillary 309 (*) 70 - 99 mg/dL  CBG MONITORING, ED     Status: Abnormal   Collection Time    05/29/14  7:21 AM      Result Value Ref Range   Glucose-Capillary 174 (*) 70 - 99 mg/dL  CBG MONITORING, ED     Status: Abnormal   Collection Time    05/29/14 12:19 PM      Result Value Ref Range   Glucose-Capillary 100 (*)  70 - 99 mg/dL  CBG MONITORING, ED     Status: Abnormal    Collection Time    05/29/14  6:00 PM      Result Value Ref Range   Glucose-Capillary 294 (*) 70 - 99 mg/dL  CBG MONITORING, ED     Status: Abnormal   Collection Time    05/29/14  9:08 PM      Result Value Ref Range   Glucose-Capillary 220 (*) 70 - 99 mg/dL  CBG MONITORING, ED     Status: Abnormal   Collection Time    05/30/14  6:34 AM      Result Value Ref Range   Glucose-Capillary 149 (*) 70 - 99 mg/dL  CBG MONITORING, ED     Status: Abnormal   Collection Time    05/30/14  8:26 AM      Result Value Ref Range   Glucose-Capillary 128 (*) 70 - 99 mg/dL  CBG MONITORING, ED     Status: Abnormal   Collection Time    05/30/14 12:31 PM      Result Value Ref Range   Glucose-Capillary 222 (*) 70 - 99 mg/dL  CBG MONITORING, ED     Status: Abnormal   Collection Time    05/30/14  6:00 PM      Result Value Ref Range   Glucose-Capillary 213 (*) 70 - 99 mg/dL  CBG MONITORING, ED     Status: Abnormal   Collection Time    05/30/14  9:11 PM      Result Value Ref Range   Glucose-Capillary 109 (*) 70 - 99 mg/dL  CBG MONITORING, ED     Status: Abnormal   Collection Time    05/31/14  8:30 AM      Result Value Ref Range   Glucose-Capillary 163 (*) 70 - 99 mg/dL   Labs are reviewed and are pertinent for no medical issues noted.  Medication reviewed no changes made  Current Facility-Administered Medications  Medication Dose Route Frequency Provider Last Rate Last Dose  . 0.9 %  sodium chloride infusion  1,000 mL Intravenous Continuous Kalman Drape, MD   1,000 mL at 05/29/14 0118  . acetaminophen (TYLENOL) tablet 650 mg  650 mg Oral Q4H PRN Kalman Drape, MD   650 mg at 05/29/14 1811  . albuterol (PROVENTIL HFA;VENTOLIN HFA) 108 (90 BASE) MCG/ACT inhaler 2 puff  2 puff Inhalation Q6H PRN Kalman Drape, MD      . alum & mag hydroxide-simeth (MAALOX/MYLANTA) 200-200-20 MG/5ML suspension 30 mL  30 mL Oral PRN Kalman Drape, MD      . ARIPiprazole (ABILIFY) tablet 20 mg  20 mg Oral Daily Kalman Drape,  MD   20 mg at 05/30/14 2128  . brimonidine (ALPHAGAN) 0.2 % ophthalmic solution 1 drop  1 drop Both Eyes BID Kalman Drape, MD   1 drop at 05/31/14 0855  . busPIRone (BUSPAR) tablet 30 mg  30 mg Oral BID Kalman Drape, MD   30 mg at 05/31/14 1050  . clonazePAM (KLONOPIN) tablet 0.5 mg  0.5 mg Oral BID PRN Kalman Drape, MD   0.5 mg at 05/30/14 2128  . DULoxetine (CYMBALTA) DR capsule 60 mg  60 mg Oral Daily Kalman Drape, MD   60 mg at 05/31/14 1043  . hydrOXYzine (ATARAX/VISTARIL) tablet 50 mg  50 mg Oral BID PRN Kalman Drape, MD   50 mg at 05/30/14 1834  . ibuprofen (ADVIL,MOTRIN) tablet  600 mg  600 mg Oral Q8H PRN Kalman Drape, MD   600 mg at 05/30/14 1324  . insulin aspart (novoLOG) injection 0-15 Units  0-15 Units Subcutaneous TID WC Kalman Drape, MD   3 Units at 05/31/14 [phone]  . insulin aspart (novoLOG) injection 8 Units  8 Units Subcutaneous TID WC Waylan Boga, NP   8 Units at 05/31/14 0858  . insulin glargine (LANTUS) injection 38 Units  38 Units Subcutaneous QHS Kalman Drape, MD   38 Units at 05/30/14 2130  . lisinopril (PRINIVIL,ZESTRIL) tablet 10 mg  10 mg Oral q morning - 10a Kalman Drape, MD   10 mg at 05/31/14 1043  . mometasone-formoterol (DULERA) 200-5 MCG/ACT inhaler 2 puff  2 puff Inhalation BID Kalman Drape, MD   2 puff at 05/31/14 0857  . montelukast (SINGULAIR) tablet 10 mg  10 mg Oral QHS Kalman Drape, MD   10 mg at 05/30/14 2116  . nicotine (NICODERM CQ - dosed in mg/24 hours) patch 21 mg  21 mg Transdermal Daily Kalman Drape, MD      . ondansetron [facility]) tablet 4 mg  4 mg Oral Q8H PRN Kalman Drape, MD      . terbinafine (LAMISIL) tablet 250 mg  250 mg Oral Daily Kalman Drape, MD   250 mg at 05/31/14 1043  . timolol (TIMOPTIC) 0.5 % ophthalmic solution 1 drop  1 drop Both Eyes BID Kalman Drape, MD   1 drop at 05/31/14 0856  . traZODone (DESYREL) tablet 50 mg  50 mg Oral QHS Kalman Drape, MD   50 mg at 05/30/14 2116  . white petrolatum (VASELINE) gel   Topical PRN Lurena Nida, NP      . zolpidem (AMBIEN) tablet 5 mg  5 mg Oral QHS PRN Kalman Drape, MD       Current Outpatient Prescriptions  Medication Sig Dispense Refill  . albuterol (PROVENTIL HFA;VENTOLIN HFA) 108 (90 BASE) MCG/ACT inhaler Inhale 2 puffs into the lungs every 6 (six) hours as needed. As needed for shortness of breath.  1 Inhaler  11  . ARIPiprazole (ABILIFY) 20 MG tablet Take 20 mg by mouth daily.      . brimonidine-timolol (COMBIGAN) 0.2-0.5 % ophthalmic solution Place 1 drop into both eyes every 12 (twelve) hours.      . busPIRone (BUSPAR) 15 MG tablet Take 30 mg by mouth 2 (two) times daily.      . clonazePAM (KLONOPIN) 0.5 MG tablet Take 1 tablet (0.5 mg total) by mouth 2 (two) times daily as needed (anxiety).  30 tablet  0  . DULoxetine (CYMBALTA) 60 MG capsule Take 1 capsule (60 mg total) by mouth daily.  7 capsule  0  . fluticasone-salmeterol (ADVAIR HFA) 230-21 MCG/ACT inhaler Inhale 1 puff into the lungs 2 (two) times daily.  1 Inhaler  12  . hydrOXYzine (ATARAX/VISTARIL) 50 MG tablet Take 1 tablet (50 mg total) by mouth 2 (two) times daily as needed for anxiety.  14 tablet  0  . ibuprofen (ADVIL,MOTRIN) 200 MG tablet Take 600-800 mg by mouth every 6 (six) hours as needed for moderate pain.      . Insulin Human (INSULIN PUMP) SOLN Inject 1 each into the skin continuous. HUMALOG.      Marland Kitchen lisinopril (PRINIVIL,ZESTRIL) 10 MG tablet Take 1 tablet (10 mg total) by mouth every morning.  90 tablet  1  . metoprolol tartrate (LOPRESSOR) 25  MG tablet Take 25 mg by mouth as needed (tachycardia).       . montelukast (SINGULAIR) 10 MG tablet Take 1 tablet (10 mg total) by mouth at bedtime.      . Prenatal Vit-Fe Fumarate-FA (PRENATAL MULTIVITAMIN) TABS tablet Take 1 tablet by mouth daily at 12 noon.      . terbinafine (LAMISIL) 250 MG tablet Take 1 tablet (250 mg total) by mouth daily.  30 tablet  2  . traZODone (DESYREL) 50 MG tablet Take 50 mg by mouth at bedtime.        Psychiatric Specialty  Exam:     Blood pressure 140/86, pulse 80, temperature 97.9 F (36.6 C), temperature source Oral, resp. rate 18, last menstrual period 05/27/2014, SpO2 99.00%.There is no weight on file to calculate BMI.  General Appearance: Casual  Eye Contact::  Fair  Speech:  Normal Rate  Volume:  Normal  Mood:  Anxious and Depressed  Affect:  Congruent  Thought Process:  Clear, coherent, logical  Orientation:  Full (Time, Place, and Person)  Thought Content:  Rumination  Suicidal Thoughts:  Yes.  with intent/plan  Homicidal Thoughts:  No  Memory:  Immediate;   Fair Recent;   Fair Remote;   Fair  Judgement:  Poor  Insight:  Lacking  Psychomotor Activity:  Decreased  Concentration:  Fair  Recall:  [employer] of Knowledge:Fair  Language: Fair  Akathisia:  No  Handed:  Left  AIMS (if indicated):     Assets:  Leisure Time Physical Health Resilience Social Support Transportation  Sleep:      Musculoskeletal: Strength & Muscle Tone: within normal limits Gait & Station: normal Patient leans: N/A  Treatment Plan Summary: Daily contact with patient to assess and evaluate symptoms and progress in treatment Medication management; admit to inpatient psychiatry unit for stabilization.  Will continue with current plan for inpatient treatment; monitoring for stabilization and safety until inpatient bed is found.    Earleen Newport, FNP-BC  05/31/2014 12:39 PM Pt seen and I agree with assessment and plan Levonne Spiller MD

## 2014-05-31 NOTE — ED Notes (Signed)
In day room watching tv 

## 2014-05-31 NOTE — ED Notes (Signed)
Sitting quietly in room, still upset, but calmer

## 2014-05-31 NOTE — ED Notes (Signed)
Sitting in room, crying, support given

## 2014-05-31 NOTE — BH Assessment (Signed)
Per Lavell Luster, [facility], adult unit is currently at capacity. Contacted the following facilities for placement:   BED AVAILABLE, FAXED CLINICAL INFORMATION: [employer], per [place], per [employer]  AT CAPACITY:  [facility], per [facility], per [facility], per [facility], per [facility], per [facility], per [facility], per [facility], per [facility], per [facility], per [facility], per [facility], per [facility], per [facility], per [place]   NO RESPONSE:  [facility]  PT DECLINED: [place], [place], [facility] Triage Specialist [phone]

## 2014-05-31 NOTE — ED Notes (Addendum)
[name] CPS into talk w/ pt

## 2014-05-31 NOTE — ED Notes (Signed)
Requesting something else for anxiety.

## 2014-05-31 NOTE — ED Notes (Signed)
Dr Harrington Challenger and shuvon np into see

## 2014-05-31 NOTE — Progress Notes (Signed)
Admission: Pt is a 28 year old female admitted voluntarily after suicide attempt by overdose.  Pt reports she is here for "impulsiveness, I took a bunch of pills.  I had a lot of emotions going on.  I have until the 1st to find a new place and my parents were talking down to me, which made me feel hopeless."  Pt presents with pleasant mood and is calm, cooperative with admission assessment.  Pt reports "I'll just have to find a new place before the 1st when I leave here."  Pt denies SI/HI at this time and verbally contracts for safety.  Pt denies [place].  Pt oriented to unit and room, non-invasive body assessment done.  Pt has eczema on top of both feet and on the bottom of right foot, tattoo on lower back and left hip.  Food and drink offered to pt, pt declined.  Pt is in no acute distress at this time. Medications administered per order.  Will continue to assess and monitor pt Q15 minutes for safety.

## 2014-05-31 NOTE — ED Notes (Signed)
Transport after 1900 per [employer] (room 502-1)

## 2014-05-31 NOTE — Progress Notes (Addendum)
Patient signed a release of information consent for her mother and father.  Pilar Plate Thacker(father) [phone] Mother Thacker(mother) [phone]  CSW attempted to get in touch with parents but was unsuccessful.     Chesley Noon, MSW, [place], 05/31/2014 Evening Clinical Social Worker [phone]

## 2014-05-31 NOTE — Progress Notes (Signed)
CSW spoke with the patient's father Pilar Plate to collect collateral information.  Father reports that the patient is unable to come to his home or any other family member's home at this time.  Father reports that he is unable to discuss the reasoning for inability to return to family's home at this time.  He reports that the patient lives alone and return to that residence a this time but will be evicted on 9/1.  CSW informed Shuvon, NP of the updated information, the recommendations at this time is to continue to search for inpatient placement.     Chesley Noon, MSW, [place], 05/31/2014 Evening Clinical Social Worker [phone]

## 2014-05-31 NOTE — ED Notes (Signed)
Pt takes abilify at night-schedule changed

## 2014-05-31 NOTE — Progress Notes (Signed)
Psychoeducational Group Note  Date:  05/31/2014 Time:  2359  Group Topic/Focus:  Wrap-Up Group:   The focus of this group is to help patients review their daily goal of treatment and discuss progress on daily workbooks.  Participation Level: Did Not Attend  Participation Quality:  Not Applicable  Affect:  Not Applicable  Cognitive:  Not Applicable  Insight:  Not Applicable  Engagement in Group: Not Applicable  Additional Comments:  The patient did not attend group since she had yet to be admitted to the unit.   Archie Balboa S 05/31/2014, 11:59 PM

## 2014-05-31 NOTE — ED Notes (Signed)
Patient presents calm and cooperative; denies any current SI/HI/AVH. NAD

## 2014-06-01 DIAGNOSIS — F3289 Other specified depressive episodes: Secondary | ICD-10-CM

## 2014-06-01 DIAGNOSIS — F329 Major depressive disorder, single episode, unspecified: Secondary | ICD-10-CM

## 2014-06-01 LAB — GLUCOSE, CAPILLARY
GLUCOSE-CAPILLARY: 256 mg/dL — AB (ref 70–99)
GLUCOSE-CAPILLARY: 304 mg/dL — AB (ref 70–99)
GLUCOSE-CAPILLARY: 153 mg/dL — AB (ref 70–99)
Glucose-Capillary: 131 mg/dL — ABNORMAL HIGH (ref 70–99)

## 2014-06-01 MED ORDER — BRIMONIDINE TARTRATE-TIMOLOL 0.2-0.5 % OP SOLN
1.0000 [drp] | Freq: Two times a day (BID) | OPHTHALMIC | Status: DC
Start: 2014-06-01 — End: 2014-06-01

## 2014-06-01 MED ORDER — TIMOLOL MALEATE 0.5 % OP SOLN
1.0000 [drp] | Freq: Two times a day (BID) | OPHTHALMIC | Status: DC
  Administered 2014-06-01 – 2014-06-03 (×5): 1 [drp] via OPHTHALMIC
  Filled 2014-06-01: qty 5

## 2014-06-01 MED ORDER — ARIPIPRAZOLE 10 MG PO TABS
20.0000 mg | ORAL_TABLET | Freq: Every day | ORAL | Status: DC
  Administered 2014-06-01 – 2014-06-02 (×2): 20 mg via ORAL
  Filled 2014-06-01: qty 2
  Filled 2014-06-01: qty 6
  Filled 2014-06-01 (×2): qty 2

## 2014-06-01 MED ORDER — IBUPROFEN 600 MG PO TABS: 600.0000 mg | ORAL_TABLET | Freq: Four times a day (QID) | ORAL | Status: DC | PRN

## 2014-06-01 NOTE — BHH Group Notes (Signed)
[place] LCSW Group Therapy 06/01/2014  1:15 pm  Type of Therapy: Group Therapy Participation Level: Active  Participation Quality: Attentive, Sharing and Supportive  Affect: Depressed and Flat  Cognitive: Alert and Oriented  Insight: Developing/Improving and Engaged  Engagement in Therapy: Developing/Improving and Engaged  Modes of Intervention: Clarification, Confrontation, Discussion, Education, Exploration,  Limit-setting, Orientation, Problem-solving, Rapport Building, Art therapist, Socialization and Support  Summary of Progress/Problems: Pt identified obstacles faced currently and processed barriers involved in overcoming these obstacles. Pt identified steps necessary for overcoming these obstacles and explored motivation (internal and external) for facing these difficulties head on. Pt further identified one area of concern in their lives and chose a goal to focus on for today. Patient identified her primary obstacle as internalizing negative feelings. She discussed that she would like to work on communication with her father and suggested that maybe they could participate in a family session with her outpatient therapist. CSW provided patient with emotional support and encouragement.  Tilden Fossa, MSW, [place] Worker [facility] [phone]

## 2014-06-01 NOTE — BHH Group Notes (Signed)
Adult Psychoeducational Group Note  Date:  06/01/2014 Time:  9:49 PM  Group Topic/Focus:  Wrap-Up Group:   The focus of this group is to help patients review their daily goal of treatment and discuss progress on daily workbooks.  Participation Level:  Active  Participation Quality:  Appropriate and Attentive  Affect:  Appropriate  Cognitive:  Alert and Appropriate  Insight: Appropriate  Engagement in Group:  Engaged  Modes of Intervention:  Discussion  Additional Comments:  She provided that her technisque of over coming obstacles , which was today's topic, she is working on controlling her emotions and understanding that she cannot control what people say and how they talk to her.   Hettie Holstein D 06/01/2014, 9:49 PM

## 2014-06-01 NOTE — BHH Counselor (Signed)
Adult Psychosocial Assessment Update Interdisciplinary Team  Previous [facility] admissions/discharges:  Admissions Discharges  Date: 10/14/13 Date: 10/17/13  Date: 09/26/13 Date: 09/29/13  Date: Date:  Date: Date:  Date: Date:   Changes since the last Psychosocial Assessment (including adherence to outpatient mental health and/or substance abuse treatment, situational issues contributing to decompensation and/or relapse). Patient reports that "everything was going good, I liked the medications I was on" prior to current hospitalization. A recent meeting between patient, her landlord, and her parents left patient feeling "ganged up on" and "talked down to" which led to her overdosing on medication. Patient reports a past history of suicide attempts. She states that she typically feels supported by her parents but expressed feelings of hurt and abandonment by them at this time. Patient is being evicted from her apartment on September 1st and parents have declined for her to stay with them. She identifies an aunt and a friend as supports that she may be able to temporarily stay with. Patient has a 46 year old daughter that lives with her. She has been following up for several months with [facility] Psychiatrist Novella Olive, MD and therapy with Rosary Lively. She would like to resume services at discharge- she has upcoming appointments on Wednesday 8/26 and Thursday 8/27. Patient denies current SI. She is able to verbalize that her overdose attempt was an "impulsive decision".              Discharge Plan 1. Will you be returning to the same living situation after discharge?   Yes: X No:      If no, what is your plan?    Patient is able to stay at her apartment until September 1st and plans to stay either with a supportive friend or her aunt afterwards until she finds more stable housing.       2. Would you like a referral for services when you are  discharged? Yes:     If yes, for what services?  No:   X    Patient already established with outpatient services in the community but is requesting information on alternative housing options.       Summary and Recommendations (to be completed by the evaluator) Patient is a 28 year old Caucasian female with a diagnosis of Bipolar, Depressed and Anxiety Disorder NOS. Patient lives in [place] with her 62 year old daughter. Patient will benefit from crisis stabilization, medication evaluation, group therapy, and psycho education in addition to case management for discharge planning.                         Signature:  Chasitee Zenker, Casimiro Needle, 06/01/2014 8:28 AM

## 2014-06-01 NOTE — BHH Suicide Risk Assessment (Signed)
[place] INPATIENT:  Family/Significant Other Suicide Prevention Education  Suicide Prevention Education:  Education Completed; Mother Judeth Porch [phone],  (name of family member/significant other) has been identified by the patient as the family member/significant other with whom the patient will be residing, and identified as the person(s) who will aid the patient in the event of a mental health crisis (suicidal ideations/suicide attempt).  With written consent from the patient, the family member/significant other has been provided the following suicide prevention education, prior to the and/or following the discharge of the patient.  The suicide prevention education provided includes the following:  Suicide risk factors  Suicide prevention and interventions  National Suicide Hotline telephone number  [facility] assessment telephone number  [facility] Emergency Assistance [place] and/or Residential Mobile Crisis Unit telephone number  Request made of family/significant other to:  Remove weapons (e.g., guns, rifles, knives), all items previously/currently identified as safety concern.    Remove drugs/medications (over-the-counter, prescriptions, illicit drugs), all items previously/currently identified as a safety concern.  The family member/significant other verbalizes understanding of the suicide prevention education information provided.  The family member/significant other agrees to remove the items of safety concern listed above.  Sanaia Jasso, Casimiro Needle 06/01/2014, 12:11 PM

## 2014-06-01 NOTE — Progress Notes (Signed)
NSG shift assessment. 7a-7p.   D: Affect blunted, brightens on approach,  mood depressed, behavior appropriate. Attends groups and participates. Cooperative with staff and is getting along well with peers. Goal today is to work on dealing with impulsiveness. She plans to be more calm and learn to deal with emotions.   A: Observed pt interacting in group and in the milieu: Support and encouragement offered. Safety maintained with observations every 15 minutes.   R: Contracts for safety and continues to follow the treatment plan, working on learning new coping skills.

## 2014-06-01 NOTE — BHH Group Notes (Signed)
   [facility] LCSW Aftercare Discharge Planning Group Note  06/01/2014  8:45 AM   Participation Quality: Alert, Appropriate and Oriented  Mood/Affect: Appropriate  Depression Rating: 0  Anxiety Rating: 0  Thoughts of Suicide: Pt denies SI/HI  Will you contract for safety? Yes  Current AVH: Pt denies  Plan for Discharge/Comments: Pt attended discharge planning group and actively participated in group. CSW provided pt with today's workbook. Patient reports that she feels "good" today. She denies depression, anxiety, SI/HI. She reports that she will be returning to her current living situation at discharge and that her parents will provide transportation home at discharge. She reports that she receives mental health services from [facility] and has upcoming appointments this week. She would like to resume services with her current outpatient provider.  Transportation Means: Pt reports access to transportation  Supports: No supports mentioned at this time  Tilden Fossa, MSW, [place] Social Worker [employer] [phone]

## 2014-06-01 NOTE — Progress Notes (Signed)
D: Patient in the dayroom on approach.  Patient appears sad and tearful.  Patient states her goal is to have a successful family meeting with her parents tomorrow.  Patient states here parents made an allegation to CPS and patient states it is untrue.  Patient is fearful she may lose custody of her 29 year old daughter.  Patient denies SI/HI and denies AVH. A: Staff to monitor Q 15 mins for safety.  Encouragement and support offered.  Scheduled medications administered per orders.  Klonopin administered prn for anxiety. R: Patient remains safe on the unit.  Patient attended group tonight.  Patient visible on the unit and interacting with peers.  Patient taking administered medications.
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
## 2014-06-01 NOTE — Clinical Social Work Note (Signed)
CSW attempted to provide SPE- left message for patient's mother Judeth Porch [phone]. CSW will attempt to contact again at a later time.  Tilden Fossa, MSW, [place] Worker [facility] [phone]

## 2014-06-01 NOTE — Clinical Social Work Note (Signed)
Family meeting scheduled for 2:45pm. Psychiatrist, patient, parents agreeable.   Tilden Fossa, MSW, [place] Worker [facility] [phone]

## 2014-06-02 LAB — GLUCOSE, CAPILLARY
GLUCOSE-CAPILLARY: 138 mg/dL — AB (ref 70–99)
Glucose-Capillary: 250 mg/dL — ABNORMAL HIGH (ref 70–99)
Glucose-Capillary: 201 mg/dL — ABNORMAL HIGH (ref 70–99)
Glucose-Capillary: 193 mg/dL — ABNORMAL HIGH (ref 70–99)

## 2014-06-02 NOTE — Progress Notes (Signed)
Recreation Therapy Notes  Animal-Assisted Activity/Therapy (AAA/T) Program Checklist/Progress Notes Patient Eligibility Criteria Checklist & Daily Group note for Rec Tx Intervention  Date: 08.25.2015 Time: 2:45pm Location: 7 Film/video editor   AAA/T Program Assumption of Risk Form signed by Patient/ or Parent Legal Guardian yes  Patient is free of allergies or sever asthma yes  Patient reports no fear of animals yes  Patient reports no history of cruelty to animals yes   Patient understands his/her participation is voluntary yes  Patient washes hands before animal contact yes  Patient washes hands after animal contact yes  Behavioral Response: Appropriate, Tearful  Education: Hand Washing, Appropriate Animal Interaction   Education Outcome: Acknowledges understanding   Clinical Observations/Feedback: Patient arrived late to session, tearful at presentation. Patient interacted with therapy dog appropriately for approximately 2-3 minutes then exited session. Patient did not return.   Laureen Ochs Waldemar Siegel, LRT/CTRS  Roquel Burgin L 06/02/2014 4:10 PM

## 2014-06-02 NOTE — Progress Notes (Signed)
Adult Psychoeducational Group Note  Date:  06/02/2014 Time:  9:43 PM  Group Topic/Focus:  Wrap-Up Group:   The focus of this group is to help patients review their daily goal of treatment and discuss progress on daily workbooks.  Participation Level:  Minimal  Participation Quality:  Inattentive  Affect:  Flat  Cognitive:  Disorganized  Insight: Lacking  Engagement in Group:  Poor  Modes of Intervention:  Limit-setting  Additional Comments:  Pt was in group but didn't talk... Just colored a picture   Ta Fair R 06/02/2014, 9:43 PM

## 2014-06-02 NOTE — BHH Group Notes (Addendum)
[facility] LCSW Group Therapy  06/02/2014   1:15 PM   Type of Therapy:  Group Therapy  Participation Level:  Active  Participation Quality:  Attentive, Sharing and Supportive  Affect:  Depressed and Flat  Cognitive:  Alert and Oriented  Insight:  Developing/Improving and Engaged  Engagement in Therapy:  Developing/Improving and Engaged  Modes of Intervention:  Clarification, Confrontation, Discussion, Education, Exploration, Limit-setting, Orientation, Problem-solving, Rapport Building, Art therapist, Socialization and Support  Summary of Progress/Problems: The topic for group therapy was feelings about diagnosis.  Pt actively participated in group discussion on their past and current diagnosis and how they feel towards this.  Pt also identified how society and family members judge them, based on their diagnosis as well as stereotypes and stigmas.  Patient discussed that being labeled with a diagnosis helps her to "know where things are coming from" and helps to validate her experiences with her mental health. Patient discussed her experience of accepting her diagnosis. She reports that she finds her outpatient therapy to be beneficial to her and that it provides her with a strong level of support. CSW and other group members provided patient with emotional support and encouragement.  Tilden Fossa, MSW, [place] Worker [facility] [phone]

## 2014-06-02 NOTE — Progress Notes (Signed)
[facility] MD Progress Note  06/02/2014 1:27 PM [name]  MRN:  [id number] Subjective: Patient states she is feeling better. She is anxious as she suspects her parents are trying to remove custody of child from her. Objective:  Patient states she is feeling better, and her mood is improved, with a fuller range of affect. She worries/ruminates about custody issues. DSS is involved in her child's case and temporary custody has been given to her parents while she is in the hospital, but as noted she states her parents may be wanting to seek longer custody arrangements. She states she is tolerating medications well and currently denies side effects. No disruptive behaviors on the unit. She has been going to groups and is visible in milieu. She has  Poorly controlled diabetes, and has been offered dietiary consult , but has declined. States " I know what I need to do, it's just a matter of doing it". States she has an established outpatient endocrinologist she plans to see soon after discharge. Responds well to support, empathy. At patient's request, she wants SW and myself to be present during a meeting with her parents later this afternoon.  Diagnosis:   Depression NOS  Total Time spent with patient: 25 minutes    ADL's: improved Sleep: improved  Appetite:  Improved   Suicidal Ideation:  Denies  Homicidal Ideation:  denies AEB (as evidenced by):  Psychiatric Specialty Exam: Physical Exam  Review of Systems  Constitutional: Negative for fever and chills.  Eyes: Positive for pain.       Patient has recurrent styes, and states she manages them with topical abx.  Respiratory: Negative for cough and shortness of breath.   Cardiovascular: Negative for chest pain.    Blood pressure 111/63, pulse 108, temperature 98.2 F (36.8 C), temperature source Oral, resp. rate 20, height 5' 6.5" (1.689 m), weight 93.895 kg (207 lb), last menstrual period 05/27/2014.Body mass index is 32.91  kg/(m^2).  General Appearance: better groomed  Eye Contact::  Good  Speech:  Normal Rate  Volume:  Normal  Mood:  improving  Affect:  Appropriate and Labile- still tearful when discussing stressors  Thought Process:  Intact and Linear  Orientation:  Other:  fully alert and attentive  Thought Content:  denies hallucinations, no delusions  Suicidal Thoughts:  No- denies any suicidal or homicidal ideations, contracts for safety on the unit  Homicidal Thoughts:  No  Memory:  NA  Judgement:  Fair  Insight:  Fair  Psychomotor Activity:  Normal  Concentration:  Good  Recall:  Good  Fund of Knowledge:Good  Language: Good  Akathisia:  No  Handed:  Right  AIMS (if indicated):     Assets:  Communication Skills Desire for Improvement Resilience  Sleep:  Number of Hours: 5.75   Musculoskeletal: Strength & Muscle Tone: within normal limits Gait & Station: normal Patient leans: N/A  Current Medications: Current Facility-Administered Medications  Medication Dose Route Frequency Provider Last Rate Last Dose  . albuterol (PROVENTIL HFA;VENTOLIN HFA) 108 (90 BASE) MCG/ACT inhaler 2 puff  2 puff Inhalation Q6H PRN Shuvon Rankin, NP      . ARIPiprazole (ABILIFY) tablet 20 mg  20 mg Oral QHS Neita Garnet, MD   20 mg at 06/01/14 2131  . brimonidine (ALPHAGAN) 0.2 % ophthalmic solution 1 drop  1 drop Both Eyes BID Shuvon Rankin, NP   1 drop at 06/02/14 0747  . busPIRone (BUSPAR) tablet 30 mg  30 mg Oral BID Earleen Newport, NP  30 mg at 06/02/14 0747  . clonazePAM (KLONOPIN) tablet 0.5 mg  0.5 mg Oral BID PRN Shuvon Rankin, NP   0.5 mg at 06/01/14 2131  . DULoxetine (CYMBALTA) DR capsule 60 mg  60 mg Oral Daily Shuvon Rankin, NP   60 mg at 06/02/14 0748  . hydrOXYzine (ATARAX/VISTARIL) tablet 50 mg  50 mg Oral BID PRN Shuvon Rankin, NP   50 mg at 06/01/14 1331  . ibuprofen (ADVIL,MOTRIN) tablet 600 mg  600 mg Oral Q6H PRN Laverle Hobby, PA-C      . insulin aspart (novoLOG) injection 0-15 Units   0-15 Units Subcutaneous TID WC Shuvon Rankin, NP   5 Units at 06/02/14 1157  . insulin aspart (novoLOG) injection 8 Units  8 Units Subcutaneous TID WC Shuvon Rankin, NP   8 Units at 06/02/14 1156  . insulin glargine (LANTUS) injection 38 Units  38 Units Subcutaneous QHS Shuvon Rankin, NP   38 Units at 06/01/14 2311  . magnesium hydroxide (MILK OF MAGNESIA) suspension 30 mL  30 mL Oral Daily PRN Shuvon Rankin, NP      . mometasone-formoterol (DULERA) 200-5 MCG/ACT inhaler 2 puff  2 puff Inhalation BID Shuvon Rankin, NP   2 puff at 06/02/14 0748  . montelukast (SINGULAIR) tablet 10 mg  10 mg Oral QHS Shuvon Rankin, NP   10 mg at 06/01/14 2312  . terbinafine (LAMISIL) tablet 250 mg  250 mg Oral Daily Shuvon Rankin, NP   250 mg at 06/02/14 0747  . timolol (TIMOPTIC) 0.5 % ophthalmic solution 1 drop  1 drop Both Eyes BID Neita Garnet, MD   1 drop at 06/02/14 0746  . traZODone (DESYREL) tablet 50 mg  50 mg Oral QHS Shuvon Rankin, NP   50 mg at 06/01/14 2131  . white petrolatum (VASELINE) gel   Topical PRN Shuvon Rankin, NP        Lab Results:  Results for orders placed during the hospital encounter of 05/31/14 (from the past 48 hour(s))  GLUCOSE, CAPILLARY     Status: Abnormal   Collection Time    06/01/14  6:02 AM      Result Value Ref Range   Glucose-Capillary 256 (*) 70 - 99 mg/dL   Comment 1 Notify RN    GLUCOSE, CAPILLARY     Status: Abnormal   Collection Time    06/01/14 11:30 AM      Result Value Ref Range   Glucose-Capillary 131 (*) 70 - 99 mg/dL  GLUCOSE, CAPILLARY     Status: Abnormal   Collection Time    06/01/14  4:52 PM      Result Value Ref Range   Glucose-Capillary 304 (*) 70 - 99 mg/dL  GLUCOSE, CAPILLARY     Status: Abnormal   Collection Time    06/01/14  9:11 PM      Result Value Ref Range   Glucose-Capillary 153 (*) 70 - 99 mg/dL  GLUCOSE, CAPILLARY     Status: Abnormal   Collection Time    06/02/14  6:14 AM      Result Value Ref Range   Glucose-Capillary 250  (*) 70 - 99 mg/dL  GLUCOSE, CAPILLARY     Status: Abnormal   Collection Time    06/02/14 11:53 AM      Result Value Ref Range   Glucose-Capillary 201 (*) 70 - 99 mg/dL   Comment 1 Documented in Chart     Comment 2 Notify RN      Physical  Findings: AIMS: Facial and Oral Movements Muscles of Facial Expression: None, normal Lips and Perioral Area: None, normal Jaw: None, normal Tongue: None, normal,Extremity Movements Upper (arms, wrists, hands, fingers): None, normal Lower (legs, knees, ankles, toes): None, normal, Trunk Movements Neck, shoulders, hips: None, normal, Overall Severity Severity of abnormal movements (highest score from questions above): None, normal Incapacitation due to abnormal movements: None, normal Patient's awareness of abnormal movements (rate only patient's report): No Awareness, Dental Status Current problems with teeth and/or dentures?: No Does patient usually wear dentures?: No  CIWA:  CIWA-Ar Total: 0 COWS:  COWS Total Score: 1  Assessment:  Patient is improving. Her mood and affect are better, and she denies any SI. She is tolerating medications well, denies side effects. She is concerned about her parents wanting to prolong their custody of her child. They are coming  Later today, and patient has requested that SW and myself be present if possible. She is hoping for discharge soon.  Treatment Plan Summary: Daily contact with patient to assess and evaluate symptoms and progress in treatment Medication management See below  Plan: Continue inpatient treatment. Consider discharge soon as she continues to improve. Continue Cymbalta, Buspar, Abilify, as prescribed. Patient denies side effects. Patient aware of importance of adequate management and follow ups regarding her DM. Will follow  Medical Decision Making Problem Points:  Established problem, stable/improving (1), Review of last therapy session (1) and Review of psycho-social stressors (1) Data  Points:  Review or order clinical lab tests (1) Review of medication regiment & side effects (2)  I certify that inpatient services furnished can reasonably be expected to improve the patient's condition.   Jamere Stidham, [place] 06/02/2014, 1:27 PM

## 2014-06-02 NOTE — Consult Note (Signed)
Patient seen, evaluated by me, treatment plan formulated by me. Patient needs inpatient admission for stabilization and treatment

## 2014-06-02 NOTE — Tx Team (Addendum)
Interdisciplinary Treatment Plan Update (Adult) Date: 06/02/2014   Time Reviewed: 9:30 AM  Progress in Treatment: Attending groups: Yes Participating in groups: Yes Taking medication as prescribed: Yes Tolerating medication: Yes Family/Significant other contact made: Yes Patient understands diagnosis: Yes Discussing patient identified problems/goals with staff: Yes Medical problems stabilized or resolved: Yes Denies suicidal/homicidal ideation: Yes Issues/concerns per patient self-inventory: Yes Other:  New problem(s) identified: N/A  Discharge Plan or Barriers: Patient has upcoming outpatient medication management and therapy appointments at [facility] on Wednesday 8/26 and Thursday 8/27. Family meeting with parents scheduled for this afternoon at 2:45.   Reason for Continuation of Hospitalization:  Depression Anxiety Medication Stabilization   Comments: N/A  Estimated length of stay: 2-4 days  For review of initial/current patient goals, please see plan of care.  Attendees:  Patient:   06/02/2014 8:51 AM   Signature: Dr. Parke Poisson, MD  06/02/2014 8:51 AM   Signature: Agustina Caroli  06/02/2014 8:51 AM   Signature: Satira Sark, RN  06/02/2014 8:51 AM   Signature: Grayland Ormond, RN  06/02/2014 8:51 AM   Signature: Thurnell Garbe RN  06/02/2014 8:51 AM   Signature: Joette Catching, LCSW  06/02/2014 8:51 AM   Signature: Tilden Fossa, LCSW-A  06/02/2014 8:51 AM   Signature: Lucinda Dell, Care Coordinator [facility]  06/02/2014 8:51 AM   Signature:  06/02/2014 8:51 AM   Signature:  06/02/2014 8:51 AM   Signature: Lars Pinks, RN [facility]  06/02/2014 8:51 AM   Signature:  06/02/2014 8:51 AM       Scribe for Treatment Team:  Tilden Fossa, MSW, [place]

## 2014-06-02 NOTE — Progress Notes (Signed)
D: Patient in the hallway on approach.  Patient states she had a bad day today.  Patient states her family meeting with her family today did not go well.  Patient also states she found out her mother and father have temporary custody of her 29 year old daughter.  Patient denies SI/HI and denies AVH. A: Staff to monitor Q 15 mins for safety.  Encouragement and support offered.  Scheduled medications administered per orders.  Vistaril administered prn for anxiety. R: Patient remains safe on the unit.  Patient attended group tonight.  Patient visible on the unit and interacting with peers.  Patient taking administered prn medications.

## 2014-06-02 NOTE — Clinical Social Work Note (Signed)
CSW, patient, and psychiatrist met with patient's mother and father. Patient's parents discussed that they are assuming temporary custody of patient's daughter. Patient is not able to return home with them at this time. Patient was visibly distraught and tearful, stating that she feels betrayed. CSW and psychiatrist provided emotional support to patient and parents. Patient left meeting abruptly and went to her room. CSW continued to provide emotional support and words of encouragement to patient.   Tilden Fossa, MSW, [place] Worker [facility] [phone]

## 2014-06-02 NOTE — Progress Notes (Signed)
The focus of this group is to educate the patient on the purpose and policies of crisis stabilization and provide a format to answer questions about their admission.  The group details unit policies and expectations of patients while admitted. Patient did not attend this group. 

## 2014-06-02 NOTE — Progress Notes (Signed)
Patient ID: [name], female   DOB: 09/21/1986, 28 y.o.   MRN: [id number] D- Patient reports she slept well and her appetite is good.  Her ability to concentrate is good and her energy level is normal.  She is rating depression, hopelessness, and anxiety at 0/10.  A- supported patient.  Talked with her about her modified carbohydrate diet.  Asked if patient thinks she can benefit from a consult with dietician.  R- Patient declined.  She says she knows what she should be eating to manage her diabetes.  She requested to see her social worker prior to family meeting this afternoon.  Patient's social worker Cyril Mourning  told of patient's request.

## 2014-06-03 DIAGNOSIS — F322 Major depressive disorder, single episode, severe without psychotic features: Principal | ICD-10-CM

## 2014-06-03 LAB — GLUCOSE, CAPILLARY
GLUCOSE-CAPILLARY: 130 mg/dL — AB (ref 70–99)
GLUCOSE-CAPILLARY: 161 mg/dL — AB (ref 70–99)

## 2014-06-03 MED ORDER — BRIMONIDINE TARTRATE-TIMOLOL 0.2-0.5 % OP SOLN: 1.0000 [drp] | Freq: Two times a day (BID) | OPHTHALMIC | Status: DC

## 2014-06-03 MED ORDER — CLONAZEPAM 0.5 MG PO TABS: 0.5000 mg | ORAL_TABLET | Freq: Two times a day (BID) | ORAL | Status: DC | PRN

## 2014-06-03 MED ORDER — HYDROXYZINE HCL 50 MG PO TABS: 50.0000 mg | ORAL_TABLET | Freq: Two times a day (BID) | ORAL | Status: DC | PRN

## 2014-06-03 MED ORDER — FLUTICASONE-SALMETEROL 230-21 MCG/ACT IN AERO: 1.0000 | INHALATION_SPRAY | Freq: Two times a day (BID) | RESPIRATORY_TRACT | Status: DC

## 2014-06-03 MED ORDER — IBUPROFEN 200 MG PO TABS: 600.0000 mg | ORAL_TABLET | Freq: Four times a day (QID) | ORAL | Status: DC | PRN

## 2014-06-03 MED ORDER — MONTELUKAST SODIUM 10 MG PO TABS: 10.0000 mg | ORAL_TABLET | Freq: Every day | ORAL | Status: DC

## 2014-06-03 MED ORDER — INSULIN PUMP: 1.0000 | SUBCUTANEOUS | Status: DC

## 2014-06-03 MED ORDER — BUSPIRONE HCL 30 MG PO TABS: 30.0000 mg | ORAL_TABLET | Freq: Two times a day (BID) | ORAL | Status: DC

## 2014-06-03 MED ORDER — ALBUTEROL SULFATE HFA 108 (90 BASE) MCG/ACT IN AERS: 2.0000 | INHALATION_SPRAY | Freq: Four times a day (QID) | RESPIRATORY_TRACT | Status: DC | PRN

## 2014-06-03 MED ORDER — TERBINAFINE HCL 250 MG PO TABS: 250.0000 mg | ORAL_TABLET | Freq: Every day | ORAL | Status: DC

## 2014-06-03 MED ORDER — ARIPIPRAZOLE 20 MG PO TABS: 20.0000 mg | ORAL_TABLET | Freq: Every day | ORAL | Status: DC

## 2014-06-03 MED ORDER — INSULIN GLARGINE 100 UNIT/ML ~~LOC~~ SOLN: 38.0000 [IU] | Freq: Every day | SUBCUTANEOUS | Status: DC

## 2014-06-03 MED ORDER — TRAZODONE HCL 50 MG PO TABS: 50.0000 mg | ORAL_TABLET | Freq: Every day | ORAL | Status: DC

## 2014-06-03 MED ORDER — DULOXETINE HCL 60 MG PO CPEP: 60.0000 mg | ORAL_CAPSULE | Freq: Every day | ORAL | Status: DC

## 2014-06-03 NOTE — BHH Group Notes (Signed)
   [facility] LCSW Aftercare Discharge Planning Group Note  06/03/2014  8:45 AM   Participation Quality: Alert, Appropriate and Oriented  Mood/Affect: Appropriate  Depression Rating: 0  Anxiety Rating: 0  Thoughts of Suicide: Pt denies SI/HI  Will you contract for safety? Yes  Current AVH: Pt denies  Plan for Discharge/Comments: Pt attended discharge planning group and actively participated in group. CSW provided pt with today's workbook. Patient states that she is not experiencing depression or anxiety this morning, denies SI/HI. She reports that she feels ready to discharge and feels that "time away" has been helpful to her. Her plan is to return home until September 1st. Afterwards, her plan is to try to stay with a family friend or go to a local shelter. She has upcoming outpatient medication management/therapy appointments today August 26th and tomorrow August 27th. She states that she is planning to attend both of these appointments.  Transportation Means: Pt reports access to transportation by friend or mother  Supports: No supports mentioned at this time  Tilden Fossa, MSW, [place] Social Worker [employer] [phone]
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
## 2014-06-03 NOTE — Progress Notes (Signed)
[facility] Adult Case Management Discharge Plan :  Will you be returning to the same living situation after discharge: Yes,  patient will return to her residence until September 1st and plans to stay with an identified family friend or a local shelter afterwards. At discharge, do you have transportation home?:Yes,  patient will have transportation home. Do you have the ability to pay for your medications:Yes,  patient will be provided with necessary medication samples and prescriptions at discharge. She verbalizes her ability to obtain medications.  Release of information consent forms completed and in the chart;  Patient's signature needed at discharge.  Patient to Follow up at: Follow-up Information   Follow up with [facility] On 06/03/2014. (Please attend appointment on this date at 2:40 pm with [name], psychiatrist. Please call office to reschedule if needed.)    Contact information:   [address] [place], [place] [address] Phone: [phone] Fax: [phone]      Follow up with [facility] On 06/04/2014. (Please attend therapy appoinment on this date at 1:30 pm with Rosary Lively. Please call office if you need to reschedule appointment.)    Contact information:   [address] [place], [place] [address] Phone: [phone]      Patient denies SI/HI:   Yes,  patient has declined current SI since admission.     Safety Planning and Suicide Prevention discussed:  Yes,  with patient and mother Hassan Rowan.  Aleja Yearwood, Casimiro Needle 06/03/2014, 10:02 AM

## 2014-06-03 NOTE — Discharge Summary (Signed)
Physician Discharge Summary Note  Patient:  [name] is an 28 y.o., female MRN:  [id number] DOB:  January 21, 1986 Patient phone:  [phone] (home)  Patient address:   [place] [address],  Total Time spent with patient: Greater than 30 minutes  Date of Admission:  05/31/2014  Date of Discharge: 06/03/14  Reason for Admission:  Mood stabilization  Discharge Diagnoses: Active Problems:   MDD (major depressive disorder), severe   Psychiatric Specialty Exam: Physical Exam  Psychiatric: Her speech is normal and behavior is normal. Judgment and thought content normal. Her mood appears not anxious. Her affect is not angry, not blunt, not labile and not inappropriate. Cognition and memory are normal. She does not exhibit a depressed mood.    Review of Systems  Constitutional: Negative.   HENT: Negative.   Eyes: Negative.   Respiratory: Negative.   Cardiovascular: Negative.   Gastrointestinal: Negative.   Genitourinary: Negative.   Musculoskeletal: Negative.   Skin: Negative.   Neurological: Negative.   Endo/Heme/Allergies: Negative.   Psychiatric/Behavioral: Positive for depression (Stable). Negative for suicidal ideas, hallucinations, memory loss and substance abuse. The patient has insomnia (Stable). The patient is not nervous/anxious.     Blood pressure 127/67, pulse 91, temperature 98.4 F (36.9 C), temperature source Oral, resp. rate 17, height 5' 6.5" (1.689 m), weight 93.895 kg (207 lb), last menstrual period 05/27/2014.Body mass index is 32.91 kg/(m^2).  General Appearance: Well Groomed  Engineer, water:: Good  Speech: Normal Rate  Volume: Normal  Mood: improved, at this time states " I feel OK"  Affect: Appropriate and reactive  Thought Process: Goal Directed and Linear  Orientation: Full (Time, Place, and Person)  Thought Content: denies hallucinations, no delusions  Suicidal Thoughts: No- at this time denies any suicidal or homicidal  ideations and contracts for safety  Homicidal Thoughts: No  Memory: NA  Judgement: Fair  Insight: Fair  Psychomotor Activity: Normal  Concentration: Good  Recall: Good  Fund of Knowledge:Good  Language: Good  Akathisia: Negative  Handed: Right  AIMS (if indicated): Assets: Communication Skills  Desire for Improvement  Resilience  Sleep: Number of Hours: 4.5   Past Psychiatric History: Diagnosis: MDD (major depressive disorder), severe  Hospitalizations: [facility] adult unit  Outpatient Care: [facility]  Substance Abuse Care: NA  Self-Mutilation: NA  Suicidal Attempts: Hx of: by overdose  Violent Behaviors:   Musculoskeletal: Strength & Muscle Tone: within normal limits Gait & Station: normal Patient leans: N/A  DSM5: Schizophrenia Disorders:  NA Obsessive-Compulsive Disorders:  NA Trauma-Stressor Disorders:  NA Substance/Addictive Disorders:  NA Depressive Disorders:  MDD (major depressive disorder), severe  Axis Diagnosis:  AXIS I:  MDD (major depressive disorder), severe AXIS II:  Deferred AXIS III:   Past Medical History  Diagnosis Date  . Asthma   . Tachycardia     baseline tachycardia   . Retinopathy due to secondary diabetes   . Diabetic gastroparesis   . 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 Diabetic neuropathy, type I diabetes mellitus   . Polyneuropathy in diabetes(357.2)   . Anxiety   . Hypertension   . Depression   . Asthma 11/10/2013  . Headache(784.0)   . Kidney stones    AXIS IV:  other psychosocial or environmental problems and problems related to social environment AXIS V:  63  Level of Care:  OP  Hospital Course:  28 year old female, who impulsively overdosed on prescribed medication (  metoprolol) in the context of an argument with her parents. She had hosted a party several days prior and because one of the persons at the party was underage, she is facing eviction from her home. Her  parents were upset about this and she felt that they were talking down to her and made her feel worthless. As noted, the overdose was impulsive.  While hospitalized in this hospital, Eveline received mood stabilization treatment due to her impulsive behavior related to mood instability. She was ordered, received and discharged on Abilify 20 mg Q bedtime for mood stability, Buspar 30 mg twice daily for anxiety, Klonopin 0.5 mg twice daily for severe anxiety disorder, Cymbalta DR 60 mg daily for depression, Hydroxyzine 50 mg twice daily as need for anxiety and Trazodone 50 mg daily for insomnia. She was enrolled and participated in the group counseling sessions being offered and held on this unit. Ambree was taught and learned coping/impulse control skills that should help her cope better and maintain mood stability after discharge. She also was resumed on all her pertinnet home medications for her other pre-existing medical issues. She tolerated her treatment regimen without any adverse effects and or reactions.  Hollyn's symptoms responded well to her treatment regimen. This is evidenced by her reports of improved mood, absence of suicidal ideations, presentation of good affects/eye contacts. She is currently being discharged to continue, counseling sessions, medication management and routine psychiatric care at the [facility] here in [place], [place]. She is provided with all the pertinent information required to make this appointments without problems. Upon discharge, she adamantly denies any SIHI, AVH, delusional thoughts and or paranoia. She received from the [facility], a 14 days worth, supply samples of her [facility] discharge medications. She left [facility] with all personal belongings in no distress. Transportation per patient's arrangement.  Consults:  psychiatry  Significant Diagnostic Studies:  labs: CBC with diff, CMP, UDS, toxicology tests, U/A  Discharge Vitals:   Blood pressure  127/67, pulse 91, temperature 98.4 F (36.9 C), temperature source Oral, resp. rate 17, height 5' 6.5" (1.689 m), weight 93.895 kg (207 lb), last menstrual period 05/27/2014. Body mass index is 32.91 kg/(m^2). Lab Results:   Results for orders placed during the hospital encounter of 05/31/14 (from the past 72 hour(s))  GLUCOSE, CAPILLARY     Status: Abnormal   Collection Time    06/01/14  6:02 AM      Result Value Ref Range   Glucose-Capillary 256 (*) 70 - 99 mg/dL   Comment 1 Notify RN    GLUCOSE, CAPILLARY     Status: Abnormal   Collection Time    06/01/14 11:30 AM      Result Value Ref Range   Glucose-Capillary 131 (*) 70 - 99 mg/dL  GLUCOSE, CAPILLARY     Status: Abnormal   Collection Time    06/01/14  4:52 PM      Result Value Ref Range   Glucose-Capillary 304 (*) 70 - 99 mg/dL  GLUCOSE, CAPILLARY     Status: Abnormal   Collection Time    06/01/14  9:11 PM      Result Value Ref Range   Glucose-Capillary 153 (*) 70 - 99 mg/dL  GLUCOSE, CAPILLARY     Status: Abnormal   Collection Time    06/02/14  6:14 AM      Result Value Ref Range   Glucose-Capillary 250 (*) 70 - 99 mg/dL  GLUCOSE, CAPILLARY     Status: Abnormal  Collection Time    06/02/14 11:53 AM      Result Value Ref Range   Glucose-Capillary 201 (*) 70 - 99 mg/dL   Comment 1 Documented in Chart     Comment 2 Notify RN    GLUCOSE, CAPILLARY     Status: Abnormal   Collection Time    06/02/14  4:54 PM      Result Value Ref Range   Glucose-Capillary 193 (*) 70 - 99 mg/dL  GLUCOSE, CAPILLARY     Status: Abnormal   Collection Time    06/02/14  8:59 PM      Result Value Ref Range   Glucose-Capillary 138 (*) 70 - 99 mg/dL  GLUCOSE, CAPILLARY     Status: Abnormal   Collection Time    06/03/14  6:03 AM      Result Value Ref Range   Glucose-Capillary 130 (*) 70 - 99 mg/dL  GLUCOSE, CAPILLARY     Status: Abnormal   Collection Time    06/03/14 11:51 AM      Result Value Ref Range   Glucose-Capillary 161 (*) 70 -  99 mg/dL   Comment 1 Notify RN      Physical Findings: AIMS: Facial and Oral Movements Muscles of Facial Expression: None, normal Lips and Perioral Area: None, normal Jaw: None, normal Tongue: None, normal,Extremity Movements Upper (arms, wrists, hands, fingers): None, normal Lower (legs, knees, ankles, toes): None, normal, Trunk Movements Neck, shoulders, hips: None, normal, Overall Severity Severity of abnormal movements (highest score from questions above): None, normal Incapacitation due to abnormal movements: None, normal Patient's awareness of abnormal movements (rate only patient's report): No Awareness, Dental Status Current problems with teeth and/or dentures?: No Does patient usually wear dentures?: No  CIWA:  CIWA-Ar Total: 0 COWS:  COWS Total Score: 1  Psychiatric Specialty Exam: See Psychiatric Specialty Exam and Suicide Risk Assessment completed by Attending Physician prior to discharge.  Discharge destination:  Home  Is patient on multiple antipsychotic therapies at discharge:  No   Has Patient had three or more failed trials of antipsychotic monotherapy by history:  No  Recommended Plan for Multiple Antipsychotic Therapies: NA    Medication List    STOP taking these medications       lisinopril 10 MG tablet  Commonly known as:  PRINIVIL,ZESTRIL     metoprolol tartrate 25 MG tablet  Commonly known as:  LOPRESSOR     prenatal multivitamin Tabs tablet      TAKE these medications     Indication   albuterol 108 (90 BASE) MCG/ACT inhaler  Commonly known as:  PROVENTIL HFA;VENTOLIN HFA  Inhale 2 puffs into the lungs every 6 (six) hours as needed. As needed for shortness of breath.   Indication:  Asthma     ARIPiprazole 20 MG tablet  Commonly known as:  ABILIFY  Take 1 tablet (20 mg total) by mouth at bedtime. For mood control   Indication:  Mood control     brimonidine-timolol 0.2-0.5 % ophthalmic solution  Commonly known as:  COMBIGAN  Place 1  drop into both eyes every 12 (twelve) hours. For Glaucoma   Indication:  Glaucoma, Ocular pressure stabilization     busPIRone 30 MG tablet  Commonly known as:  BUSPAR  Take 1 tablet (30 mg total) by mouth 2 (two) times daily. For anxiety   Indication:  Generalized Anxiety Disorder     clonazePAM 0.5 MG tablet  Commonly known as:  KLONOPIN  Take 1 tablet (  0.5 mg total) by mouth 2 (two) times daily as needed (anxiety).   Indication:  Severe anxiety     DULoxetine 60 MG capsule  Commonly known as:  CYMBALTA  Take 1 capsule (60 mg total) by mouth daily. For depression   Indication:  Major Depressive Disorder     fluticasone-salmeterol 230-21 MCG/ACT inhaler  Commonly known as:  ADVAIR HFA  Inhale 1 puff into the lungs 2 (two) times daily. For asthma   Indication:  Asthma     hydrOXYzine 50 MG tablet  Commonly known as:  ATARAX/VISTARIL  Take 1 tablet (50 mg total) by mouth 2 (two) times daily as needed for anxiety.   Indication:  Anxiety     ibuprofen 200 MG tablet  Commonly known as:  ADVIL,MOTRIN  Take 3-4 tablets (600-800 mg total) by mouth every 6 (six) hours as needed for moderate pain.   Indication:  Mild to Moderate Pain     insulin glargine 100 UNIT/ML injection  Commonly known as:  LANTUS  Inject 0.38 mLs (38 Units total) into the skin at bedtime. For diabetes management   Indication:  Type 2 Diabetes     insulin pump Soln  Inject 1 each into the skin continuous. For diabetes management   Indication:  Diabetes management     montelukast 10 MG tablet  Commonly known as:  SINGULAIR  Take 1 tablet (10 mg total) by mouth at bedtime. For allergies   Indication:  Asthma, Allergies     terbinafine 250 MG tablet  Commonly known as:  LAMISIL  Take 1 tablet (250 mg total) by mouth daily. For infection   Indication:  Epidermal infection treatment     traZODone 50 MG tablet  Commonly known as:  DESYREL  Take 1 tablet (50 mg total) by mouth at bedtime. For sleep    Indication:  Trouble Sleeping       Follow-up Information   Follow up with [facility] On 06/03/2014. (Please attend appointment on this date at 2:40 pm with [name], psychiatrist. Please call office to reschedule if needed.)    Contact information:   [address] [place], [place] [address] Phone: [phone] Fax: [phone]      Follow up with [facility] On 06/04/2014. (Please attend therapy appoinment on this date at 1:30 pm with Rosary Lively. Please call office if you need to reschedule appointment.)    Contact information:   [address] [place], [place] [address] Phone: [phone]     Follow-up recommendations: Activity:  As tolerated Diet: As recommended by your primary care doctor. Keep all scheduled follow-up appointments as recommended.   Comments: Take all your medications as prescribed by your mental healthcare provider. Report any adverse effects and or reactions from your medicines to your outpatient provider promptly. Patient is instructed and cautioned to not engage in alcohol and or illegal drug use while on prescription medicines. In the event of worsening symptoms, patient is instructed to call the crisis hotline, 911 and or go to the nearest ED for appropriate evaluation and treatment of symptoms. Follow-up with your primary care provider for your other medical issues, concerns and or health care needs.    Total Discharge Time:  Greater than 30 minutes.  Signed: Encarnacion Slates, PMHNP-BC 06/03/2014, 2:21 PM   Patient seen, Suicide Assessment Completed.  Disposition Plan Reviewed

## 2014-06-03 NOTE — Progress Notes (Signed)
Patient ID: [name], female   DOB: 11/16/1985, 28 y.o.   MRN: [id number]  Pt. Denies SI/HI and A/V hallucinations. Patient rates her depression, hopelessness, and anxiety at 0/10 for the day. Belongings returned to patient at time of discharge. Patient denies any pain or discomfort. Discharge instructions and medications were reviewed with patient. Patient verbalized understanding of both medications and discharge instructions. Q15 minute safety checks maintained until discharge. No distress noted upon discharge.

## 2014-06-03 NOTE — Progress Notes (Signed)
Patient ID: [name], female   DOB: 03/20/1986, 28 y.o.   MRN: [id number] Family meeting with patient, her parents, SW , and myself was conducted yesterday. Duration about 40 minutes. Parents reported to patient that they have applied and have apparently been granted temporary custody of patient's child. Also informed her that she will not be able to live with parents after upcoming eviction. Patient was intermittently angry , upset, and eventually left the room. I recommended that patient and family consider family therapy as ongoing treatment after discharge, as they made reference to chronic family dynamics issues that have affected their relationships and sense of well being.  I met with patient later in the afternoon- she was calm, denied any suicidal or homicidal ideations, and stated she was going to focus on finding a place to live after vacating her apartment in a week, and planned to contest custody issues via court if necessary.   

## 2014-06-03 NOTE — Clinical Social Work Note (Signed)
CSW and psychiatrist met with patient privately following aftercare group to discuss her discharge. CSW shared with patient her mother's concerns about her safety after she leaves. When asked about SI, patient reports that she is not experiencing any suicidal ideation at this time nor does she have a plan. She reports that her previous attempts were impulsive decisions following stressful events. Patient can contract for safety at this time and feels safe to discharge. She confirms that she plans to attend upcoming appointments this afternoon at 2:40 pm and tomorrow at 1:30 pm with [facility] for medication management and therapy. She states that she is going to work towards regaining custody of her daughter and plans to return to work in 2.5 weeks. CSW informed patient that her mother has offered to provide her transportation to appointments and work. CSW and psychiatrist provided emotional support and encouragement.   CSW contacted patient's mother Brenda to inform her of patient's discharge today. Patient's mother shared concerns regarding patient's safety once she is released. CSW provided mother with emotional support and informed her that we cannot hold patient at hospital against her wishes if she can contract for safety. CSW provided SPE again to mother and emphasized the importance of getting patient assistance if she displays warning signs of suicidal behavior in the future. Mother thanked CSW for assistance.   , MSW, LCSWA Clinical Social Worker [facility] [phone]  

## 2014-06-03 NOTE — Progress Notes (Signed)
Adult Psychoeducational Group Note  Date:  06/03/2014 Time:  10:41 AM  Group Topic/Focus:  Personal Choices and Values:   The focus of this group is to help patients assess and explore the importance of values in their lives, how their values affect their decisions, how they express their values and what opposes their expression.  Participation Level:  Active  Participation Quality:  Appropriate, Attentive, Sharing and Supportive  Affect:  Appropriate  Cognitive:  Alert, Appropriate and Oriented  Insight: Appropriate and Good  Engagement in Group:  Engaged and Supportive  Modes of Intervention:  Discussion, Education, Socialization and Support  Additional Comments:  Pt attended and participated in group.  Pt stated "Personal development means inner strength and affirmation of yourself."  Pt also stated that she needs to work on her own inner strength.  Milus Glazier 06/03/2014, 10:41 AM

## 2014-06-03 NOTE — Clinical Social Work Note (Signed)
CSW provided patient with information on applying for public housing and local shelters. Patient thanked CSW, has no further questions.   Tilden Fossa, MSW, [place] Worker [facility] [phone]

## 2014-06-04 ENCOUNTER — Encounter (HOSPITAL_BASED_OUTPATIENT_CLINIC_OR_DEPARTMENT_OTHER): Payer: 59

## 2014-06-04 NOTE — Progress Notes (Signed)
Patient Discharge Instructions:  After Visit Summary (AVS):   Faxed to:  06/04/14 Discharge Summary Note:   Faxed to:  06/04/14 Psychiatric Admission Assessment Note:   Faxed to:  06/04/14 Suicide Risk Assessment - Discharge Assessment:   Faxed to:  06/04/14 Faxed/Sent to the Next Level Care provider:  06/04/14 Faxed to [facility], 06/04/2014, 3:58 PM

## 2014-06-29 ENCOUNTER — Encounter (HOSPITAL_BASED_OUTPATIENT_CLINIC_OR_DEPARTMENT_OTHER): Payer: 59 | Attending: Plastic Surgery

## 2014-06-29 DIAGNOSIS — E1169 Type 2 diabetes mellitus with other specified complication: Secondary | ICD-10-CM | POA: Diagnosis not present

## 2014-06-29 DIAGNOSIS — L97509 Non-pressure chronic ulcer of other part of unspecified foot with unspecified severity: Secondary | ICD-10-CM | POA: Insufficient documentation

## 2014-06-30 ENCOUNTER — Telehealth: Payer: Self-pay | Admitting: *Deleted

## 2014-06-30 ENCOUNTER — Encounter: Payer: Self-pay | Admitting: Family Medicine

## 2014-06-30 ENCOUNTER — Encounter (HOSPITAL_COMMUNITY): Payer: Self-pay | Admitting: Emergency Medicine

## 2014-06-30 ENCOUNTER — Emergency Department (HOSPITAL_COMMUNITY)
Admission: EM | Admit: 2014-06-30 | Discharge: 2014-06-30 | Disposition: A | Discharge status: Home / Self Care | Payer: 59 | Attending: Emergency Medicine | Admitting: Emergency Medicine

## 2014-06-30 ENCOUNTER — Ambulatory Visit (INDEPENDENT_AMBULATORY_CARE_PROVIDER_SITE_OTHER): Payer: 59 | Admitting: Family Medicine

## 2014-06-30 VITALS — BP 124/80 | HR 96 | Temp 99.1°F | Resp 18 | Wt 204.0 lb

## 2014-06-30 DIAGNOSIS — R52 Pain, unspecified: Secondary | ICD-10-CM | POA: Insufficient documentation

## 2014-06-30 DIAGNOSIS — Z79899 Other long term (current) drug therapy: Secondary | ICD-10-CM | POA: Insufficient documentation

## 2014-06-30 DIAGNOSIS — L0291 Cutaneous abscess, unspecified: Secondary | ICD-10-CM

## 2014-06-30 DIAGNOSIS — J029 Acute pharyngitis, unspecified: Secondary | ICD-10-CM | POA: Diagnosis not present

## 2014-06-30 DIAGNOSIS — F329 Major depressive disorder, single episode, unspecified: Secondary | ICD-10-CM | POA: Insufficient documentation

## 2014-06-30 DIAGNOSIS — F411 Generalized anxiety disorder: Secondary | ICD-10-CM | POA: Insufficient documentation

## 2014-06-30 DIAGNOSIS — IMO0002 Reserved for concepts with insufficient information to code with codable children: Secondary | ICD-10-CM | POA: Diagnosis not present

## 2014-06-30 DIAGNOSIS — E1039 Type 1 diabetes mellitus with other diabetic ophthalmic complication: Secondary | ICD-10-CM | POA: Insufficient documentation

## 2014-06-30 DIAGNOSIS — E1049 Type 1 diabetes mellitus with other diabetic neurological complication: Secondary | ICD-10-CM | POA: Diagnosis not present

## 2014-06-30 DIAGNOSIS — F3289 Other specified depressive episodes: Secondary | ICD-10-CM | POA: Diagnosis not present

## 2014-06-30 DIAGNOSIS — J45909 Unspecified asthma, uncomplicated: Secondary | ICD-10-CM | POA: Insufficient documentation

## 2014-06-30 DIAGNOSIS — Z794 Long term (current) use of insulin: Secondary | ICD-10-CM | POA: Diagnosis not present

## 2014-06-30 DIAGNOSIS — R110 Nausea: Secondary | ICD-10-CM | POA: Insufficient documentation

## 2014-06-30 DIAGNOSIS — I10 Essential (primary) hypertension: Secondary | ICD-10-CM | POA: Diagnosis not present

## 2014-06-30 DIAGNOSIS — Z87442 Personal history of urinary calculi: Secondary | ICD-10-CM | POA: Diagnosis not present

## 2014-06-30 DIAGNOSIS — R000 Tachycardia, unspecified: Secondary | ICD-10-CM | POA: Diagnosis not present

## 2014-06-30 DIAGNOSIS — L0390 Cellulitis, unspecified: Principal | ICD-10-CM

## 2014-06-30 DIAGNOSIS — E1142 Type 2 diabetes mellitus with diabetic polyneuropathy: Secondary | ICD-10-CM | POA: Diagnosis not present

## 2014-06-30 DIAGNOSIS — E11311 Type 2 diabetes mellitus with unspecified diabetic retinopathy with macular edema: Secondary | ICD-10-CM | POA: Diagnosis not present

## 2014-06-30 MED ORDER — CEPHALEXIN 500 MG PO CAPS: 500.0000 mg | ORAL_CAPSULE | Freq: Four times a day (QID) | ORAL | Status: DC

## 2014-06-30 MED ORDER — SULFAMETHOXAZOLE-TMP DS 800-160 MG PO TABS: 1.0000 | ORAL_TABLET | Freq: Two times a day (BID) | ORAL | Status: DC

## 2014-06-30 NOTE — Progress Notes (Signed)
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  She has been putting a dry dressing on it.  She is really not sure how it happened but does have decreased sensation.  PAST MEDICAL HISTORY:  Positive for diabetes 1, hypertension, asthma, neuropathy.  PAST SURGICAL HISTORY:  C-section, detached retina x2, and cholecystectomy.  ALLERGIES:  CEFTIN.  MEDICATIONS:  Abilify, Proventil, Combivent, Klonopin, Cymbalta, Advair, Atarax, insulin pump, lisinopril, Singulair, trazodone, and BuSpar.  Her latest hemoglobin A1c was in the 13 range.  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  REVIEW OF SYSTEMS:  Otherwise, negative.  OBJECTIVE:  GENERAL:  On exam, she is alert, oriented, and cooperative, not in any distress.  She is very pleasant. HEENT:  Her extraocular muscles are intact. RESPIRATORY:  Her breathing is nonlabored. CARDIOVASCULAR:  Her heart rate is regular. EXTREMITIES:  Her pulses are present.  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  She had a toe ring on the second toe.  ASSESSMENT AND PLAN:  The ulcer was debrided and those notes are noted in the chart.  I have instructed her to never wear a toe ring.  I also need her to see a podiatrist for regular foot care.  We will do silver collagen, elevation, offloading, and will see her back in 1 week.  We will also check a prealbumin.     Theodoro Kos, DO     CS/MEDQ  D:  06/29/2014  T:  06/30/2014  Job:  [id number]

## 2014-06-30 NOTE — Telephone Encounter (Signed)
Left message on vm regarding message below.

## 2014-06-30 NOTE — Telephone Encounter (Signed)
Pt called stating that [facility] are down and can not fill her prescription that was sent it today and that it will take a couple hrs before it comes back up, pt wanted to have Bactrim sent to [facility] in [place], script was sent to [facility] instead.

## 2014-06-30 NOTE — Telephone Encounter (Signed)
Not necessary to go to ER.  Please begin antibiotic.

## 2014-06-30 NOTE — Telephone Encounter (Signed)
Pt called stating was just here for visit and had been diagnosed with MRSA, she is staying with a friend right now and wanted your opinion on what she needed to do, her friend has two small children and will friend finds out she has MRSA she knows she will not let her stay with her until she gets rid of it.the patient wanted to know if you thought she should go to ED to get IV antibiotic or find hotel.. Please advise.

## 2014-06-30 NOTE — Progress Notes (Signed)
Subjective:    Patient ID: [name], female    DOB: [birth year], 28 y.o.   MRN: [id number]  HPI  The patient developed a red spot on her left shoulder yesterday. Today it is currently 7.5 cm x 4.5 cm. It is erythematous high and extremely warm to the touch. She is very tender in this area. There is no fluctuance. There is no obvious abscess. She does have a history of staph infection. Past Medical History  Diagnosis Date  . Asthma   . Tachycardia     baseline tachycardia   . Retinopathy due to secondary diabetes   . Diabetic gastroparesis   . 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 Diabetic neuropathy, type I diabetes mellitus   . Polyneuropathy in diabetes(357.2)   . Anxiety   . Hypertension   . Depression   . Asthma 11/10/2013  . Headache(784.0)   . Kidney stones    Past Surgical History  Procedure Laterality Date  . Cholecystectomy    . Cesarean section    . Refractive surgery     Current Outpatient Prescriptions on File Prior to Visit  Medication Sig Dispense Refill  . albuterol (PROVENTIL HFA;VENTOLIN HFA) 108 (90 BASE) MCG/ACT inhaler Inhale 2 puffs into the lungs every 6 (six) hours as needed. As needed for shortness of breath.  1 Inhaler  11  . brimonidine-timolol (COMBIGAN) 0.2-0.5 % ophthalmic solution Place 1 drop into both eyes every 12 (twelve) hours. For Glaucoma      . busPIRone (BUSPAR) 30 MG tablet Take 1 tablet (30 mg total) by mouth 2 (two) times daily. For anxiety  60 tablet  0  . clonazePAM (KLONOPIN) 0.5 MG tablet Take 1 tablet (0.5 mg total) by mouth 2 (two) times daily as needed (anxiety).  30 tablet  0  . DULoxetine (CYMBALTA) 60 MG capsule Take 1 capsule (60 mg total) by mouth daily. For depression  30 capsule  0  . fluticasone-salmeterol (ADVAIR HFA) 230-21 MCG/ACT inhaler Inhale 1 puff into the lungs 2 (two) times daily. For asthma  1 Inhaler  12  . hydrOXYzine (ATARAX/VISTARIL) 50 MG tablet Take 1 tablet (50  mg total) by mouth 2 (two) times daily as needed for anxiety.  45 tablet  0  . ibuprofen (ADVIL,MOTRIN) 200 MG tablet Take 3-4 tablets (600-800 mg total) by mouth every 6 (six) hours as needed for moderate pain.  30 tablet  0  . insulin glargine (LANTUS) 100 UNIT/ML injection Inject 0.38 mLs (38 Units total) into the skin at bedtime. For diabetes management  10 mL  0  . Insulin Human (INSULIN PUMP) SOLN Inject 1 each into the skin continuous. For diabetes management      . montelukast (SINGULAIR) 10 MG tablet Take 1 tablet (10 mg total) by mouth at bedtime. For allergies      . traZODone (DESYREL) 50 MG tablet Take 1 tablet (50 mg total) by mouth at bedtime. For sleep  30 tablet  0  . terbinafine (LAMISIL) 250 MG tablet Take 1 tablet (250 mg total) by mouth daily. For infection  30 tablet  2   No current facility-administered medications on file prior to visit.   Allergies  Allergen Reactions  . Ceftin Rash   History   Social History  . Marital Status: Married    Spouse Name: N/A    Number of Children: N/A  . Years of Education: N/A   Occupational History  . Not  on file.   Social History Main Topics  . Smoking status: Never Smoker   . Smokeless tobacco: Never Used  . Alcohol Use: No  . Drug Use: No  . Sexual Activity: Yes    Partners: Male    Birth Control/ Protection: IUD, Pill   Other Topics Concern  . Not on file   Social History Narrative   Pt has a daughter and boyfriend.             Review of Systems  All other systems reviewed and are negative.      Objective:   Physical Exam  Vitals reviewed. Cardiovascular: Normal rate and regular rhythm.   Pulmonary/Chest: Effort normal and breath sounds normal.  Skin: Skin is warm. There is erythema.          Assessment & Plan:  Cellulitis and abscess - Plan: sulfamethoxazole-trimethoprim (BACTRIM DS) 800-160 MG per tablet   Begin Bactrim one tablet by mouth twice a day for 10 days. Recheck in 48 hours. I  believe this is likely staph cellulitis. At this point there is no visible abscess. We need to monitor closely to make sure an abscess does not develop which would require incision and drainage.

## 2014-06-30 NOTE — ED Notes (Signed)
Pt was seen at PCP this AM for red area on shoulder that developed this AM. Area is warm to touch and red. No obvious signs of abscess.  PT given a RX for Bactrim PO with instructions to follow up with PCP. Pt states "I just thought I would come here to get IV antibiotics instead." Reports intermittent chills. Reports generalized body aches as well starting this AM.

## 2014-06-30 NOTE — ED Provider Notes (Signed)
CSN: [id number]     Arrival date & time 06/30/14  1330 History   First MD Initiated Contact with Patient 06/30/14 1547     Chief Complaint  Patient presents with  . Generalized Body Aches     (Consider location/radiation/quality/duration/timing/severity/associated sxs/prior Treatment) HPI Pt is a 28 y/o female w/ PMHx of DM1, anxiety, and HTN who presents to the ED with a left shoulder abscess. Yesterday she popped a pimple on her left shoulder and then noticed this morning it was tender and inflamed. She went to her PCP this morning and received bactrim BID x 10 days but was unable to pick it up due to pharmacy difficulties. She came to the ED b/c since her appt w/ her PCP this morning she started having chills, and diffuse myalgias. Her current pain level is 8/10 compared to 10/10 this morning. However, she was concerned about the diffuse body aches she was having. She also reports nausea but no vomiting. Denies fevers, diarrhea, confusion, CP, SOB. She does have a sore throat and dry cough.    Past Medical History  Diagnosis Date  . Asthma   . Tachycardia     baseline tachycardia   . Retinopathy due to secondary diabetes   . Diabetic gastroparesis   . 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 Diabetic neuropathy, type I diabetes mellitus   . Polyneuropathy in diabetes(357.2)   . Anxiety   . Hypertension   . Depression   . Asthma 11/10/2013  . Headache(784.0)   . Kidney stones    Past Surgical History  Procedure Laterality Date  . Cholecystectomy    . Cesarean section    . Refractive surgery     Family History  Problem Relation Age of Onset  . Hypertension     History  Substance Use Topics  . Smoking status: Never Smoker   . Smokeless tobacco: Never Used  . Alcohol Use: No   OB History   Grav Para Term Preterm Abortions TAB SAB Ect Mult Living   1 1        1      Review of Systems  Constitutional: Positive for chills. Negative for  fever.  HENT: Positive for sore throat.   Eyes: Positive for redness (baseline).  Respiratory: Positive for cough.   Cardiovascular: Negative for chest pain and leg swelling.  Gastrointestinal: Positive for nausea. Negative for vomiting and diarrhea.  Musculoskeletal: Positive for myalgias.  Neurological: Negative for dizziness and weakness.  Psychiatric/Behavioral: Negative for confusion.      Allergies  Ceftin  Home Medications   Prior to Admission medications   Medication Sig Start Date End Date Taking? Authorizing Provider  albuterol (PROVENTIL HFA;VENTOLIN HFA) 108 (90 BASE) MCG/ACT inhaler Inhale 2 puffs into the lungs every 6 (six) hours as needed. As needed for shortness of breath. 06/03/14  Yes Encarnacion Slates, NP  ARIPiprazole (ABILIFY) 30 MG tablet Take 30 mg by mouth at bedtime.   Yes Historical Provider, MD  brimonidine-timolol (COMBIGAN) 0.2-0.5 % ophthalmic solution Place 1 drop into both eyes every 12 (twelve) hours. For Glaucoma 06/03/14  Yes Encarnacion Slates, NP  busPIRone (BUSPAR) 30 MG tablet Take 1 tablet (30 mg total) by mouth 2 (two) times daily. For anxiety 06/03/14  Yes Encarnacion Slates, NP  clonazePAM (KLONOPIN) 0.5 MG tablet Take 1 tablet (0.5 mg total) by mouth 2 (two) times daily as needed (anxiety). 06/03/14  Yes Encarnacion Slates, NP  DULoxetine (  CYMBALTA) 60 MG capsule Take 1 capsule (60 mg total) by mouth daily. For depression 06/03/14  Yes Encarnacion Slates, NP  fluticasone-salmeterol (ADVAIR HFA) 230-21 MCG/ACT inhaler Inhale 1 puff into the lungs 2 (two) times daily. For asthma 06/03/14  Yes Encarnacion Slates, NP  hydrOXYzine (ATARAX/VISTARIL) 50 MG tablet Take 1 tablet (50 mg total) by mouth 2 (two) times daily as needed for anxiety. 06/03/14  Yes Encarnacion Slates, NP  ibuprofen (ADVIL,MOTRIN) 200 MG tablet Take 3-4 tablets (600-800 mg total) by mouth every 6 (six) hours as needed for moderate pain. 06/03/14  Yes Encarnacion Slates, NP  insulin glargine (LANTUS) 100 UNIT/ML injection  Inject 0.38 mLs (38 Units total) into the skin at bedtime. For diabetes management 06/03/14  Yes Encarnacion Slates, NP  Insulin Human (INSULIN PUMP) SOLN Inject 1 each into the skin continuous. For diabetes management 06/03/14  Yes Encarnacion Slates, NP  montelukast (SINGULAIR) 10 MG tablet Take 1 tablet (10 mg total) by mouth at bedtime. For allergies 06/03/14  Yes Encarnacion Slates, NP  traZODone (DESYREL) 50 MG tablet Take 1 tablet (50 mg total) by mouth at bedtime. For sleep 06/03/14  Yes Encarnacion Slates, NP  cephALEXin (KEFLEX) 500 MG capsule Take 1 capsule (500 mg total) by mouth 4 (four) times daily. 06/30/14   Julious Oka, MD  sulfamethoxazole-trimethoprim (BACTRIM DS) 800-160 MG per tablet Take 1 tablet by mouth 2 (two) times daily. 06/30/14   [name], MD  sulfamethoxazole-trimethoprim (BACTRIM DS) 800-160 MG per tablet Take 1 tablet by mouth 2 (two) times daily. 06/30/14   Julious Oka, MD   BP 133/70  Pulse 102  Temp(Src) 99.4 F (37.4 C) (Oral)  Resp 16  SpO2 99%  LMP 06/30/2014 Physical Exam  Constitutional: She appears well-developed and well-nourished. No distress.  Eyes:  Injected sclera b/l  Cardiovascular: Regular rhythm.   Tachycardic   Pulmonary/Chest: Effort normal and breath sounds normal. She has no wheezes.  Abdominal: Soft. Bowel sounds are normal. There is no tenderness.  Musculoskeletal: She exhibits tenderness. She exhibits no edema.  Lymphadenopathy:    She has no cervical adenopathy.  Neurological:  Normal shoulder strength and handgrip b/l  Skin: She is not diaphoretic.  Left shoulder abscess 5-6cm in diameter with white head at center that is indurated & erythematous  w/ no fluctuance, no pus was expressed, tender to palpation.     ED Course  Procedures (including critical care time)  MDM   Final diagnoses:  Abscess    Lt should abscess with no fluctuance and does not appear to be tracking. Pt afebrile. Given paper prescription for keflex QID x 7 days  and bactrim BID x 7 days. Instructed to apply warm soaks to left shoulder to promote drainage.     Julious Oka, MD 06/30/14 [phone]

## 2014-06-30 NOTE — ED Notes (Signed)
Pt comfortable with discharge and follow up instructions. Prescriptions x2. 

## 2014-06-30 NOTE — Discharge Instructions (Signed)
Start taking keflex 500 mg four times a day for 7 days and bactrim DS twice a day for 7 days. If your symptoms worsen call your PCP or go to the ED. Also apply warm soaks to your left shoulder.    Cellulitis Cellulitis is an infection of the skin and the tissue under the skin. The infected area is usually red and tender. This happens most often in the arms and lower legs. HOME CARE   Take your antibiotic medicine as told. Finish the medicine even if you start to feel better.  Keep the infected arm or leg raised (elevated).  Put a warm cloth on the area up to 4 times per day.  Only take medicines as told by your doctor.  Keep all doctor visits as told. GET HELP IF:  You see red streaks on the skin coming from the infected area.  Your red area gets bigger or turns a dark color.  Your bone or joint under the infected area is painful after the skin heals.  Your infection comes back in the same area or different area.  You have a puffy (swollen) bump in the infected area.  You have new symptoms.  You have a fever. GET HELP RIGHT AWAY IF:   You feel very sleepy.  You throw up (vomit) or have watery poop (diarrhea).  You feel sick and have muscle aches and pains. MAKE SURE YOU:   Understand these instructions.  Will watch your condition.  Will get help right away if you are not doing well or get worse. Document Released: 03/13/2008 Document Revised: 02/09/2014 Document Reviewed: 12/11/2011 [facility] Patient Information 2015 [place], [place]. This information is not intended to replace advice given to you by your health care provider. Make sure you discuss any questions you have with your health care provider.

## 2014-07-02 ENCOUNTER — Encounter: Payer: Self-pay | Admitting: Family Medicine

## 2014-07-02 ENCOUNTER — Ambulatory Visit (INDEPENDENT_AMBULATORY_CARE_PROVIDER_SITE_OTHER): Payer: 59 | Admitting: Family Medicine

## 2014-07-02 VITALS — BP 132/70 | HR 78 | Temp 98.1°F | Resp 14 | Ht 66.54 in | Wt 204.0 lb

## 2014-07-02 DIAGNOSIS — L0291 Cutaneous abscess, unspecified: Secondary | ICD-10-CM

## 2014-07-02 DIAGNOSIS — L0390 Cellulitis, unspecified: Principal | ICD-10-CM

## 2014-07-02 NOTE — ED Provider Notes (Signed)
I saw and evaluated the patient, reviewed the resident's note and I agree with the findings and plan.  Patient with history of IDDM.  Presents with complaints of left shoulder pain, redness, and swelling in the absence of injury or trauma.  No fevers or chills.  No aggravating or alleviating factors.  On exam, vitals are stable and she is afebrile.  Head is at, [place].  Neck is supple.  There is mild redness and erythema to the top of the left shoulder.  There is warmth to the touch.  There is no fluctuance.  This appears to be a cellulitis, but no fluctuance that would suggest I and D is indicated.  Will treat with antibiotics, prn return.      [name] Speak, MD 07/02/14 [phone]

## 2014-07-02 NOTE — Progress Notes (Signed)
Subjective:    Patient ID: [name], female    DOB: Jun 13, 1986, 28 y.o.   MRN: [id number]  HPI  06/30/14 The patient developed a red spot on her left shoulder yesterday. Today it is currently 7.5 cm x 4.5 cm. It is erythematous high and extremely warm to the touch. She is very tender in this area. There is no fluctuance. There is no obvious abscess. She does have a history of staph infection.  At that time, my plan was: Begin Bactrim one tablet by mouth twice a day for 10 days. Recheck in 48 hours. I believe this is likely staph cellulitis. At this point there is no visible abscess. We need to monitor closely to make sure an abscess does not develop which would require incision and drainage.  07/02/14 Patient is here today for a recheck. The lesion is still 7.5 cm x 5 cm in size. It is erythematous tender and warm. She went to the emergency room yesterday and was started on Keflex in addition to Bactrim. There's been no significant improvement. There is a 4 mm pustule now forming on the superior aspect of the shoulder.  Past Medical History  Diagnosis Date  . Asthma   . Tachycardia     baseline tachycardia   . Retinopathy due to secondary diabetes   . Diabetic gastroparesis   . 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 Diabetic neuropathy, type I diabetes mellitus   . Polyneuropathy in diabetes(357.2)   . Anxiety   . Hypertension   . Depression   . Asthma 11/10/2013  . Headache(784.0)   . Kidney stones    Past Surgical History  Procedure Laterality Date  . Cholecystectomy    . Cesarean section    . Refractive surgery     Current Outpatient Prescriptions on File Prior to Visit  Medication Sig Dispense Refill  . albuterol (PROVENTIL HFA;VENTOLIN HFA) 108 (90 BASE) MCG/ACT inhaler Inhale 2 puffs into the lungs every 6 (six) hours as needed. As needed for shortness of breath.  1 Inhaler  11  . ARIPiprazole (ABILIFY) 30 MG tablet Take 30 mg by  mouth at bedtime.      . brimonidine-timolol (COMBIGAN) 0.2-0.5 % ophthalmic solution Place 1 drop into both eyes every 12 (twelve) hours. For Glaucoma      . busPIRone (BUSPAR) 30 MG tablet Take 1 tablet (30 mg total) by mouth 2 (two) times daily. For anxiety  60 tablet  0  . cephALEXin (KEFLEX) 500 MG capsule Take 1 capsule (500 mg total) by mouth 4 (four) times daily.  28 capsule  0  . clonazePAM (KLONOPIN) 0.5 MG tablet Take 1 tablet (0.5 mg total) by mouth 2 (two) times daily as needed (anxiety).  30 tablet  0  . DULoxetine (CYMBALTA) 60 MG capsule Take 1 capsule (60 mg total) by mouth daily. For depression  30 capsule  0  . fluticasone-salmeterol (ADVAIR HFA) 230-21 MCG/ACT inhaler Inhale 1 puff into the lungs 2 (two) times daily. For asthma  1 Inhaler  12  . hydrOXYzine (ATARAX/VISTARIL) 50 MG tablet Take 1 tablet (50 mg total) by mouth 2 (two) times daily as needed for anxiety.  45 tablet  0  . ibuprofen (ADVIL,MOTRIN) 200 MG tablet Take 3-4 tablets (600-800 mg total) by mouth every 6 (six) hours as needed for moderate pain.  30 tablet  0  . insulin glargine (LANTUS) 100 UNIT/ML injection Inject 0.38 mLs (38 Units total)  into the skin at bedtime. For diabetes management  10 mL  0  . Insulin Human (INSULIN PUMP) SOLN Inject 1 each into the skin continuous. For diabetes management      . montelukast (SINGULAIR) 10 MG tablet Take 1 tablet (10 mg total) by mouth at bedtime. For allergies      . sulfamethoxazole-trimethoprim (BACTRIM DS) 800-160 MG per tablet Take 1 tablet by mouth 2 (two) times daily.  14 tablet  0  . traZODone (DESYREL) 50 MG tablet Take 1 tablet (50 mg total) by mouth at bedtime. For sleep  30 tablet  0   No current facility-administered medications on file prior to visit.   Allergies  Allergen Reactions  . Ceftin Rash   History   Social History  . Marital Status: Married    Spouse Name: N/A    Number of Children: N/A  . Years of Education: N/A   Occupational  History  . Not on file.   Social History Main Topics  . Smoking status: Never Smoker   . Smokeless tobacco: Never Used  . Alcohol Use: No  . Drug Use: No  . Sexual Activity: Yes    Partners: Male    Birth Control/ Protection: IUD, Pill   Other Topics Concern  . Not on file   Social History Narrative   Pt has a daughter and boyfriend.             Review of Systems  All other systems reviewed and are negative.      Objective:   Physical Exam  Vitals reviewed. Cardiovascular: Normal rate and regular rhythm.   Pulmonary/Chest: Effort normal and breath sounds normal.  Skin: Skin is warm. There is erythema.   Please see the description in the history of present illness.       Assessment & Plan:  Cellulitis and abscess - Plan: Culture, routine-abscess   I anesthetized the pustule was 0.1% lidocaine without epinephrine. I then performed a 1 cm cruciate incision. I opened the incision with hemostats to a depth of approximately centimeters. I was able to express blood and trace purulent material. There is no significant purulent material. A wound culture was performed. The incision was then packed with quarter-inch iodoform gauze. Wound care is discussed. Recheck the patient in one day.

## 2014-07-03 ENCOUNTER — Encounter: Payer: Self-pay | Admitting: Family Medicine

## 2014-07-03 ENCOUNTER — Ambulatory Visit (INDEPENDENT_AMBULATORY_CARE_PROVIDER_SITE_OTHER): Payer: 59 | Admitting: Family Medicine

## 2014-07-03 VITALS — Temp 98.2°F

## 2014-07-03 DIAGNOSIS — L0291 Cutaneous abscess, unspecified: Secondary | ICD-10-CM

## 2014-07-03 DIAGNOSIS — L0390 Cellulitis, unspecified: Principal | ICD-10-CM

## 2014-07-03 NOTE — Progress Notes (Signed)
Subjective:    Patient ID: [name], female    DOB: 1986-07-06, 28 y.o.   MRN: [id number]  HPI  06/30/14 The patient developed a red spot on her left shoulder yesterday. Today it is currently 7.5 cm x 4.5 cm. It is erythematous high and extremely warm to the touch. She is very tender in this area. There is no fluctuance. There is no obvious abscess. She does have a history of staph infection.  At that time, my plan was: Begin Bactrim one tablet by mouth twice a day for 10 days. Recheck in 48 hours. I believe this is likely staph cellulitis. At this point there is no visible abscess. We need to monitor closely to make sure an abscess does not develop which would require incision and drainage.  07/02/14 Patient is here today for a recheck. The lesion is still 7.5 cm x 5 cm in size. It is erythematous tender and warm. She went to the emergency room yesterday and was started on Keflex in addition to Bactrim. There's been no significant improvement. There is a 4 mm pustule now forming on the superior aspect of the shoulder.  At that time, my plan was: I anesthetized the pustule was 0.1% lidocaine without epinephrine. I then performed a 1 cm cruciate incision. I opened the incision with hemostats to a depth of approximately 2 centimeters. I was able to express blood and trace purulent material. There is no significant purulent material. A wound culture was performed. The incision was then packed with quarter-inch iodoform gauze. Wound care is discussed. Recheck the patient in one day.  07/03/14 She is here for recheck.  Area looks much better. The erythema is beginning to fade. The wound is still draining a trace amount of purulent material. However there is no fluctuance on examination. The erythema now extends from 5 cm to 4 cm. There is visible regression of the erythema. The shoulder is less painful.  Past Medical History  Diagnosis Date  . Asthma   . Tachycardia     baseline tachycardia     . Retinopathy due to secondary diabetes   . Diabetic gastroparesis   . 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 Diabetic neuropathy, type I diabetes mellitus   . Polyneuropathy in diabetes(357.2)   . Anxiety   . Hypertension   . Depression   . Asthma 11/10/2013  . Headache(784.0)   . Kidney stones    Past Surgical History  Procedure Laterality Date  . Cholecystectomy    . Cesarean section    . Refractive surgery     Current Outpatient Prescriptions on File Prior to Visit  Medication Sig Dispense Refill  . albuterol (PROVENTIL HFA;VENTOLIN HFA) 108 (90 BASE) MCG/ACT inhaler Inhale 2 puffs into the lungs every 6 (six) hours as needed. As needed for shortness of breath.  1 Inhaler  11  . ARIPiprazole (ABILIFY) 30 MG tablet Take 30 mg by mouth at bedtime.      . brimonidine-timolol (COMBIGAN) 0.2-0.5 % ophthalmic solution Place 1 drop into both eyes every 12 (twelve) hours. For Glaucoma      . busPIRone (BUSPAR) 30 MG tablet Take 1 tablet (30 mg total) by mouth 2 (two) times daily. For anxiety  60 tablet  0  . cephALEXin (KEFLEX) 500 MG capsule Take 1 capsule (500 mg total) by mouth 4 (four) times daily.  28 capsule  0  . clonazePAM (KLONOPIN) 0.5 MG tablet Take 1 tablet (  0.5 mg total) by mouth 2 (two) times daily as needed (anxiety).  30 tablet  0  . DULoxetine (CYMBALTA) 60 MG capsule Take 1 capsule (60 mg total) by mouth daily. For depression  30 capsule  0  . fluticasone-salmeterol (ADVAIR HFA) 230-21 MCG/ACT inhaler Inhale 1 puff into the lungs 2 (two) times daily. For asthma  1 Inhaler  12  . hydrOXYzine (ATARAX/VISTARIL) 50 MG tablet Take 1 tablet (50 mg total) by mouth 2 (two) times daily as needed for anxiety.  45 tablet  0  . ibuprofen (ADVIL,MOTRIN) 200 MG tablet Take 3-4 tablets (600-800 mg total) by mouth every 6 (six) hours as needed for moderate pain.  30 tablet  0  . insulin glargine (LANTUS) 100 UNIT/ML injection Inject 0.38 mLs (38 Units  total) into the skin at bedtime. For diabetes management  10 mL  0  . Insulin Human (INSULIN PUMP) SOLN Inject 1 each into the skin continuous. For diabetes management      . montelukast (SINGULAIR) 10 MG tablet Take 1 tablet (10 mg total) by mouth at bedtime. For allergies      . sulfamethoxazole-trimethoprim (BACTRIM DS) 800-160 MG per tablet Take 1 tablet by mouth 2 (two) times daily.  14 tablet  0  . traZODone (DESYREL) 50 MG tablet Take 1 tablet (50 mg total) by mouth at bedtime. For sleep  30 tablet  0   No current facility-administered medications on file prior to visit.   Allergies  Allergen Reactions  . Ceftin Rash   History   Social History  . Marital Status: Married    Spouse Name: N/A    Number of Children: N/A  . Years of Education: N/A   Occupational History  . Not on file.   Social History Main Topics  . Smoking status: Never Smoker   . Smokeless tobacco: Never Used  . Alcohol Use: No  . Drug Use: No  . Sexual Activity: Yes    Partners: Male    Birth Control/ Protection: IUD, Pill   Other Topics Concern  . Not on file   Social History Narrative   Pt has a daughter and boyfriend.             Review of Systems  All other systems reviewed and are negative.      Objective:   Physical Exam  Vitals reviewed. Cardiovascular: Normal rate and regular rhythm.   Pulmonary/Chest: Effort normal and breath sounds normal.  Skin: Skin is warm. There is erythema.   Please see the description in the history of present illness.       Assessment & Plan:  Cellulitis and abscess   Patient is now definitely improving. I want her to complete Bactrim as well as Keflex. Wound care is discussed. The packing is removed. I do not want her involved in direct patient care until it is no longer draining purulent material. I believe she can return to patient care by Monday. I anticipate that the wound should close spontaneously by Friday. If the pain and erythema  continue to improve and she follows that trajectory she does not need to return for followup. She can return immediately if complications arise

## 2014-07-06 ENCOUNTER — Ambulatory Visit: Payer: Self-pay | Admitting: *Deleted

## 2014-07-06 DIAGNOSIS — E1169 Type 2 diabetes mellitus with other specified complication: Secondary | ICD-10-CM | POA: Diagnosis not present

## 2014-07-13 ENCOUNTER — Encounter (HOSPITAL_BASED_OUTPATIENT_CLINIC_OR_DEPARTMENT_OTHER): Payer: 59 | Attending: Plastic Surgery

## 2014-07-13 DIAGNOSIS — L97519 Non-pressure chronic ulcer of other part of right foot with unspecified severity: Secondary | ICD-10-CM | POA: Insufficient documentation

## 2014-07-13 DIAGNOSIS — E10621 Type 1 diabetes mellitus with foot ulcer: Secondary | ICD-10-CM | POA: Insufficient documentation

## 2014-07-13 NOTE — Progress Notes (Signed)
[facility]  NAME:  [name], [name]                  ACCOUNT NO.:  MEDICAL RECORD NO.:  [id number]      DATE OF BIRTH:  1986-06-17  PHYSICIAN:  Theodoro Kos, DO       VISIT DATE:  07/13/2014                                  OFFICE VISIT   The patient is a 28 year old female who has type 1 diabetes and that is uncontrolled.  Her ulcer is on her right foot great toe plantar surface. The patient has been soaking it, trying to elevate it, and using collagen.  She has noticed it is a little more painful, little more pressure over this past week, and she has been on it a lot more.  She is alert, oriented, and cooperative.  She is not in any acute distress.  She is very pleasant.  Her breathing is unlabored.  Her heart rate is regular.  The wound was debrided and those notes are noted in the chart.  The plan is to continue with the collagen, elevation, multivitamin, vitamin C, zinc, diet, and diabetes control, offloading, and then next week she is going to arrange for a ride and we will plan on the [place], DO     CS/MEDQ  D:  07/13/2014  T:  07/13/2014  Job:  [id number]

## 2014-07-16 LAB — CULTURE, ROUTINE-ABSCESS
Gram Stain: NONE SEEN
Gram Stain: NONE SEEN

## 2014-07-18 ENCOUNTER — Encounter: Payer: Self-pay | Admitting: *Deleted

## 2014-07-20 DIAGNOSIS — E10621 Type 1 diabetes mellitus with foot ulcer: Secondary | ICD-10-CM | POA: Diagnosis not present

## 2014-07-22 DIAGNOSIS — E10621 Type 1 diabetes mellitus with foot ulcer: Secondary | ICD-10-CM | POA: Diagnosis not present

## 2014-07-23 ENCOUNTER — Encounter (HOSPITAL_COMMUNITY): Payer: Self-pay | Admitting: Psychology

## 2014-07-23 DIAGNOSIS — F332 Major depressive disorder, recurrent severe without psychotic features: Secondary | ICD-10-CM

## 2014-07-23 NOTE — Progress Notes (Signed)
[name] is a 28 y.o. female patient being discharged as she failed to return for counseling following her 11/13/13 assessment.   Outpatient Therapist Discharge Summary  [name]    [birth year]   Admission Date: 11/13/13   Discharge Date:  07/23/14 Reason for Discharge:  Didn't return for services Diagnosis:   Major depressive disorder, recurrent, severe without psychotic features    Comments:  Pt no showed for 2 followup appointments.  Delle Reining, LPC

## 2014-07-27 DIAGNOSIS — E10621 Type 1 diabetes mellitus with foot ulcer: Secondary | ICD-10-CM | POA: Diagnosis not present

## 2014-07-27 NOTE — Progress Notes (Signed)
[facility]  NAME:  [name], [name]                  ACCOUNT NO.:  MEDICAL RECORD NO.:  [id number]      DATE OF BIRTH:  [birth year]  PHYSICIAN:  Theodoro Kos, DO       VISIT DATE:  07/27/2014                                  OFFICE VISIT   The patient is a 28 year old female who is here for followup on her diabetic great toe ulcer on the right.  She is doing extremely well and showing very positive signs of improvement.  There is no change in medications or social history.  On exam, she is alert, oriented, cooperative, not in any acute distress. She is very pleasant.  Pupils are equal.  Breathing is unlabored.  Her heart rate is regular.  Her abdomen is soft.  Her toe shows marked signs of improvement filling in, I think the offloading has made a huge difference for her, and we will continue that for this week with collagen at the base.  She is to continue blood sugar control and vitamins and increasing her protein.     Theodoro Kos, DO     CS/MEDQ  D:  07/27/2014  T:  07/27/2014  Job:  [id number]

## 2014-07-30 ENCOUNTER — Telehealth: Payer: Self-pay | Admitting: Family Medicine

## 2014-07-30 MED ORDER — VALACYCLOVIR HCL 1 G PO TABS: 2000.0000 mg | ORAL_TABLET | Freq: Two times a day (BID) | ORAL | Status: DC

## 2014-07-30 NOTE — Telephone Encounter (Signed)
Valtrex 2 g pobid x 1 day 

## 2014-07-30 NOTE — Telephone Encounter (Signed)
[phone] Patient is calling to see if we can call in something for a cold sore if possible  [facility]

## 2014-07-30 NOTE — Telephone Encounter (Signed)
Prescription sent to pharmacy. .   Call placed to patient and patient made aware.  

## 2014-08-03 DIAGNOSIS — E10621 Type 1 diabetes mellitus with foot ulcer: Secondary | ICD-10-CM | POA: Diagnosis not present

## 2014-08-04 NOTE — Progress Notes (Signed)
[facility]  NAME:  BRIDGIT, EYNON                  ACCOUNT NO.:  MEDICAL RECORD NO.:  [id number]      DATE OF BIRTH:  Oct 31, 1985  PHYSICIAN:  Theodoro Kos, DO       VISIT DATE:  08/03/2014                                  OFFICE VISIT   The patient is a 28 year old female who is here for a right great toe diabetic foot ulcer.  She has been in a noncontact cast for this past week with some collagen and has healed.  There is still a scab there, so I definitely want to see her again, but there is no change in medication or social history.  On exam, she is alert and oriented, cooperative, not in any distress. Pupils equal.  Breathing is unlabored.  Heart rate is regular.  No redness.  No swelling.  The wound is not open.  It appears to be healed. She is going to be moving this week so she is going to be on it a little bit more, so she also asked to not be in the splint because it was removed, so I would like to see her back in 1 week.  We will do triple antibiotic ointment and her regular shoe.  Continue with multivitamin, vitamin C, zinc, elevation, and we will see her back in 1 week.     Theodoro Kos, DO     CS/MEDQ  D:  08/03/2014  T:  08/04/2014  Job:  [id number]

## 2014-08-08 ENCOUNTER — Encounter (HOSPITAL_COMMUNITY): Payer: Self-pay | Admitting: Emergency Medicine

## 2014-08-08 ENCOUNTER — Emergency Department (HOSPITAL_COMMUNITY)
Admission: EM | Admit: 2014-08-08 | Discharge: 2014-08-08 | Disposition: A | Discharge status: Home / Self Care | Payer: 59 | Attending: Emergency Medicine | Admitting: Emergency Medicine

## 2014-08-08 DIAGNOSIS — F419 Anxiety disorder, unspecified: Secondary | ICD-10-CM | POA: Diagnosis not present

## 2014-08-08 DIAGNOSIS — J45901 Unspecified asthma with (acute) exacerbation: Secondary | ICD-10-CM | POA: Insufficient documentation

## 2014-08-08 DIAGNOSIS — Z794 Long term (current) use of insulin: Secondary | ICD-10-CM | POA: Diagnosis not present

## 2014-08-08 DIAGNOSIS — E1040 Type 1 diabetes mellitus with diabetic neuropathy, unspecified: Secondary | ICD-10-CM | POA: Diagnosis not present

## 2014-08-08 DIAGNOSIS — F329 Major depressive disorder, single episode, unspecified: Secondary | ICD-10-CM | POA: Insufficient documentation

## 2014-08-08 DIAGNOSIS — Z79899 Other long term (current) drug therapy: Secondary | ICD-10-CM | POA: Insufficient documentation

## 2014-08-08 DIAGNOSIS — I10 Essential (primary) hypertension: Secondary | ICD-10-CM | POA: Diagnosis not present

## 2014-08-08 DIAGNOSIS — Z7951 Long term (current) use of inhaled steroids: Secondary | ICD-10-CM | POA: Insufficient documentation

## 2014-08-08 DIAGNOSIS — Z87442 Personal history of urinary calculi: Secondary | ICD-10-CM | POA: Diagnosis not present

## 2014-08-08 DIAGNOSIS — Z792 Long term (current) use of antibiotics: Secondary | ICD-10-CM | POA: Insufficient documentation

## 2014-08-08 DIAGNOSIS — R0602 Shortness of breath: Secondary | ICD-10-CM | POA: Diagnosis present

## 2014-08-08 LAB — CBC WITH DIFFERENTIAL/PLATELET
BASOS ABS: 0 10*3/uL (ref 0.0–0.1)
BASOS PCT: 0 % (ref 0–1)
EOS PCT: 5 % (ref 0–5)
Eosinophils Absolute: 0.6 10*3/uL (ref 0.0–0.7)
HCT: 35.2 % — ABNORMAL LOW (ref 36.0–46.0)
Hemoglobin: 11.9 g/dL — ABNORMAL LOW (ref 12.0–15.0)
Lymphocytes Relative: 24 % (ref 12–46)
Lymphs Abs: 2.8 10*3/uL (ref 0.7–4.0)
MCH: 28.3 pg (ref 26.0–34.0)
MCHC: 33.8 g/dL (ref 30.0–36.0)
MCV: 83.8 fL (ref 78.0–100.0)
Monocytes Absolute: 0.9 10*3/uL (ref 0.1–1.0)
Monocytes Relative: 8 % (ref 3–12)
Neutro Abs: 7.1 10*3/uL (ref 1.7–7.7)
Neutrophils Relative %: 63 % (ref 43–77)
Platelets: 263 10*3/uL (ref 150–400)
RBC: 4.2 MIL/uL (ref 3.87–5.11)
RDW: 13.4 % (ref 11.5–15.5)
WBC: 11.4 10*3/uL — AB (ref 4.0–10.5)

## 2014-08-08 LAB — BASIC METABOLIC PANEL
ANION GAP: 12 (ref 5–15)
BUN: 21 mg/dL (ref 6–23)
CO2: 23 mEq/L (ref 19–32)
Calcium: 8.5 mg/dL (ref 8.4–10.5)
Chloride: 97 mEq/L (ref 96–112)
Creatinine, Ser: 0.96 mg/dL (ref 0.50–1.10)
GFR, EST NON AFRICAN AMERICAN: 79 mL/min — AB (ref >90)
Glucose, Bld: 427 mg/dL — ABNORMAL HIGH (ref 70–99)
POTASSIUM: 4.5 meq/L (ref 3.7–5.3)
Sodium: 132 mEq/L — ABNORMAL LOW (ref 137–147)

## 2014-08-08 MED ORDER — IPRATROPIUM BROMIDE 0.02 % IN SOLN
RESPIRATORY_TRACT | Status: AC
  Administered 2014-08-08: 0.5 mg
  Filled 2014-08-08: qty 2.5

## 2014-08-08 MED ORDER — ALBUTEROL SULFATE (2.5 MG/3ML) 0.083% IN NEBU
INHALATION_SOLUTION | RESPIRATORY_TRACT | Status: AC
  Administered 2014-08-08: 5 mg
  Filled 2014-08-08: qty 6

## 2014-08-08 MED ORDER — ALBUTEROL (5 MG/ML) CONTINUOUS INHALATION SOLN
10.0000 mg/h | INHALATION_SOLUTION | Freq: Once | RESPIRATORY_TRACT | Status: AC
  Administered 2014-08-08: 10 mg/h via RESPIRATORY_TRACT
  Filled 2014-08-08: qty 20

## 2014-08-08 MED ORDER — ALBUTEROL SULFATE (2.5 MG/3ML) 0.083% IN NEBU
INHALATION_SOLUTION | RESPIRATORY_TRACT | Status: DC
Start: 2014-08-08 — End: 2014-08-08
  Filled 2014-08-08: qty 6

## 2014-08-08 MED ORDER — ALBUTEROL SULFATE HFA 108 (90 BASE) MCG/ACT IN AERS: 1.0000 | INHALATION_SPRAY | Freq: Four times a day (QID) | RESPIRATORY_TRACT | Status: DC | PRN

## 2014-08-08 MED ORDER — METHYLPREDNISOLONE SODIUM SUCC 125 MG IJ SOLR
125.0000 mg | Freq: Once | INTRAMUSCULAR | Status: AC
  Administered 2014-08-08: 125 mg via INTRAVENOUS
  Filled 2014-08-08: qty 2

## 2014-08-08 MED ORDER — ALBUTEROL SULFATE (2.5 MG/3ML) 0.083% IN NEBU: 2.5000 mg | INHALATION_SOLUTION | RESPIRATORY_TRACT | Status: DC | PRN

## 2014-08-08 MED ORDER — PREDNISONE 50 MG PO TABS: ORAL_TABLET | ORAL | Status: DC

## 2014-08-08 MED ORDER — SODIUM CHLORIDE 0.9 % IV BOLUS (SEPSIS)
500.0000 mL | Freq: Once | INTRAVENOUS | Status: AC
  Administered 2014-08-08: 500 mL via INTRAVENOUS

## 2014-08-08 NOTE — Discharge Instructions (Signed)

## 2014-08-08 NOTE — ED Notes (Signed)
Pt presents with c/o of shortness of breadth, states she is having a an asthma attack secondary to exposure to dust while moving furniture, had a breathing treatment at home with no relief, pt with labored breathing, able to maintain O2 sats at 93% room air.

## 2014-08-08 NOTE — ED Provider Notes (Signed)
CSN: [id number]     Arrival date & time 08/08/14  2153 History   First MD Initiated Contact with Patient 08/08/14 2200     Chief Complaint  Patient presents with  . Asthma Attack      (Consider location/radiation/quality/duration/timing/severity/associated sxs/prior Treatment) Patient is a 28 y.o. female presenting with shortness of breath. The history is provided by the patient and a parent.  Shortness of Breath Severity: mild to mod. Onset quality:  Gradual Timing:  Constant Progression:  Worsening Chronicity:  Recurrent Context comment:  Unpacking boxes, around dust today Relieved by:  Nothing Worsened by:  Nothing tried Ineffective treatments:  Inhaler Associated symptoms: no abdominal pain, no chest pain, no cough, no fever, no headaches, no neck pain and no vomiting     Past Medical History  Diagnosis Date  . Asthma   . Tachycardia     baseline tachycardia   . Retinopathy due to secondary diabetes   . Diabetic gastroparesis   . 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 Diabetic neuropathy, type I diabetes mellitus   . Polyneuropathy in diabetes(357.2)   . Anxiety   . Hypertension   . Depression   . Asthma 11/10/2013  . Headache(784.0)   . Kidney stones    Past Surgical History  Procedure Laterality Date  . Cholecystectomy    . Cesarean section    . Refractive surgery     Family History  Problem Relation Age of Onset  . Hypertension     History  Substance Use Topics  . Smoking status: Never Smoker   . Smokeless tobacco: Never Used  . Alcohol Use: No   OB History   Grav Para Term Preterm Abortions TAB SAB Ect Mult Living   1 1        1      Review of Systems  Constitutional: Negative for fever and fatigue.  HENT: Negative for congestion and drooling.   Eyes: Negative for pain.  Respiratory: Positive for shortness of breath. Negative for cough.   Cardiovascular: Negative for chest pain.  Gastrointestinal: Negative  for nausea, vomiting, abdominal pain and diarrhea.  Genitourinary: Negative for dysuria and hematuria.  Musculoskeletal: Negative for back pain, gait problem and neck pain.  Skin: Negative for color change.  Neurological: Negative for dizziness and headaches.  Hematological: Negative for adenopathy.  Psychiatric/Behavioral: Negative for behavioral problems.  All other systems reviewed and are negative.     Allergies  Ceftin  Home Medications   Prior to Admission medications   Medication Sig Start Date End Date Taking? Authorizing Provider  albuterol (PROVENTIL HFA;VENTOLIN HFA) 108 (90 BASE) MCG/ACT inhaler Inhale 2 puffs into the lungs every 6 (six) hours as needed. As needed for shortness of breath. 06/03/14  Yes Encarnacion Slates, NP  ARIPiprazole (ABILIFY) 30 MG tablet Take 30 mg by mouth at bedtime.   Yes Historical Provider, MD  brimonidine-timolol (COMBIGAN) 0.2-0.5 % ophthalmic solution Place 1 drop into both eyes every 12 (twelve) hours. For Glaucoma 06/03/14  Yes Encarnacion Slates, NP  busPIRone (BUSPAR) 30 MG tablet Take 1 tablet (30 mg total) by mouth 2 (two) times daily. For anxiety 06/03/14  Yes Encarnacion Slates, NP  clonazePAM (KLONOPIN) 0.5 MG tablet Take 1 tablet (0.5 mg total) by mouth 2 (two) times daily as needed (anxiety). 06/03/14  Yes Encarnacion Slates, NP  DULoxetine (CYMBALTA) 60 MG capsule Take 1 capsule (60 mg total) by mouth daily. For depression 06/03/14  Yes Encarnacion Slates, NP  fluticasone-salmeterol (ADVAIR HFA) 230-21 MCG/ACT inhaler Inhale 1 puff into the lungs 2 (two) times daily. For asthma 06/03/14  Yes Encarnacion Slates, NP  hydrOXYzine (ATARAX/VISTARIL) 50 MG tablet Take 1 tablet (50 mg total) by mouth 2 (two) times daily as needed for anxiety. 06/03/14  Yes Encarnacion Slates, NP  ibuprofen (ADVIL,MOTRIN) 200 MG tablet Take 3-4 tablets (600-800 mg total) by mouth every 6 (six) hours as needed for moderate pain. 06/03/14  Yes Encarnacion Slates, NP  Insulin Human (INSULIN PUMP) SOLN  Inject 1 each into the skin 3 times daily with meals, bedtime and 2 AM. 38 units/day basal; bolus sliding scale   Yes Historical Provider, MD  montelukast (SINGULAIR) 10 MG tablet Take 1 tablet (10 mg total) by mouth at bedtime. For allergies 06/03/14  Yes Encarnacion Slates, NP  traZODone (DESYREL) 50 MG tablet Take 1 tablet (50 mg total) by mouth at bedtime. For sleep 06/03/14  Yes Encarnacion Slates, NP  cephALEXin (KEFLEX) 500 MG capsule Take 1 capsule (500 mg total) by mouth 4 (four) times daily. 06/30/14   Julious Oka, MD  insulin glargine (LANTUS) 100 UNIT/ML injection Inject 38 Units into the skin at bedtime. When NOT on insulin pump    Historical Provider, MD  sulfamethoxazole-trimethoprim (BACTRIM DS) 800-160 MG per tablet Take 1 tablet by mouth 2 (two) times daily. 06/30/14   Julious Oka, MD  valACYclovir (VALTREX) 1000 MG tablet Take 2 tablets (2,000 mg total) by mouth 2 (two) times daily. 07/30/14   [name], MD   BP 144/89  Pulse 130  Temp(Src) 98.2 F (36.8 C) (Oral)  Resp 20  SpO2 93% Physical Exam  Nursing note and vitals reviewed. Constitutional: She is oriented to person, place, and time. She appears well-developed and well-nourished.  HENT:  Head: Normocephalic.  Mouth/Throat: Oropharynx is clear and moist. No oropharyngeal exudate.  Eyes: Conjunctivae and EOM are normal. Pupils are equal, round, and reactive to light.  Neck: Normal range of motion. Neck supple.  Cardiovascular: Regular rhythm, normal heart sounds and intact distal pulses.  Exam reveals no gallop and no friction rub.   No murmur heard. Pulmonary/Chest: She is in respiratory distress. She has wheezes.  Mild tachypnea, diminished breath sounds, expiratory wheezing heard in the upper lobes  Abdominal: Soft. Bowel sounds are normal. There is no tenderness. There is no rebound and no guarding.  Musculoskeletal: Normal range of motion. She exhibits no edema and no tenderness.  Neurological: She is alert and  oriented to person, place, and time.  Skin: Skin is warm and dry.  Psychiatric: She has a normal mood and affect. Her behavior is normal.    ED Course  Procedures (including critical care time) Labs Review Labs Reviewed  CBC WITH DIFFERENTIAL - Abnormal; Notable for the following:    WBC 11.4 (*)    Hemoglobin 11.9 (*)    HCT 35.2 (*)    All other components within normal limits  BASIC METABOLIC PANEL - Abnormal; Notable for the following:    Sodium 132 (*)    Glucose, Bld 427 (*)    GFR calc non Af Amer 79 (*)    All other components within normal limits    Imaging Review No results found.   EKG Interpretation None      MDM   Final diagnoses:  Asthma exacerbation    10:31 PM 28 y.o. female  With a history of asthma and diabetes  Who presents with shortness of breath that began several hours ago. She states that she has been unpacking boxes today and has been around a lot of dust. She has been using her inhaler every 1-2 hours today without significant relief. Her shortness of breath became worse around 7 PM this evening. She notes that it is consistent with previous asthma exacerbations. She is tachycardic and On exam. She is speaking in short phrases. Will give steroids and a continuous albuterol breathing treatment.  11:30 PM: Pt feeling much better. Now only a faint exp wheeze is heard on exam. Will place on steroids.   The patient was also found to be hyperglycemic with a blood sugar of 427. She states that she had just eaten prior to arrival. She took some insulin here. She has no elevation in her anion gap. I have discussed the diagnosis/risks/treatment options with the patient and family and believe the pt to be eligible for discharge home to follow-up with her pcp as needed. We also discussed returning to the ED immediately if new or worsening sx occur. We discussed the sx which are most concerning (e.g., worsening sob, fever) that necessitate immediate return.  Medications administered to the patient during their visit and any new prescriptions provided to the patient are listed below.  Medications given during this visit Medications  methylPREDNISolone sodium succinate (SOLU-MEDROL) 125 mg/2 mL injection 125 mg (125 mg Intravenous Given 08/08/14 2258)  albuterol (PROVENTIL,VENTOLIN) solution continuous neb (10 mg/hr Nebulization Given 08/08/14 2226)  sodium chloride 0.9 % bolus 500 mL (500 mLs Intravenous New Bag/Given 08/08/14 2258)  albuterol (PROVENTIL) (2.5 MG/3ML) 0.083% nebulizer solution (5 mg  Given 08/08/14 2204)  ipratropium (ATROVENT) 0.02 % nebulizer solution (0.5 mg  Given 08/08/14 2204)    Discharge Medication List as of 08/08/2014 11:42 PM    START taking these medications   Details  !! albuterol (PROVENTIL HFA;VENTOLIN HFA) 108 (90 BASE) MCG/ACT inhaler Inhale 1-2 puffs into the lungs every 6 (six) hours as needed for wheezing or shortness of breath., Starting 08/08/2014, Until Discontinued, Print    albuterol (PROVENTIL) (2.5 MG/3ML) 0.083% nebulizer solution Take 3 mLs (2.5 mg total) by nebulization every 4 (four) hours as needed for wheezing or shortness of breath., Starting 08/08/2014, Until Discontinued, Print    predniSONE (DELTASONE) 50 MG tablet Take 1 tablet by mouth daily for the next 4 days., Print     !! - Potential duplicate medications found. Please discuss with provider.       Pamella Pert, MD 08/09/14 [id number]

## 2014-08-10 ENCOUNTER — Encounter (HOSPITAL_COMMUNITY): Payer: Self-pay | Admitting: Emergency Medicine

## 2014-08-10 ENCOUNTER — Encounter (HOSPITAL_BASED_OUTPATIENT_CLINIC_OR_DEPARTMENT_OTHER): Payer: 59 | Attending: Plastic Surgery

## 2014-08-10 DIAGNOSIS — L97519 Non-pressure chronic ulcer of other part of right foot with unspecified severity: Secondary | ICD-10-CM | POA: Diagnosis not present

## 2014-08-10 DIAGNOSIS — E11621 Type 2 diabetes mellitus with foot ulcer: Secondary | ICD-10-CM | POA: Insufficient documentation

## 2014-08-11 NOTE — Progress Notes (Signed)
[facility]  NAME:  [name], [name]             ACCOUNT NO.:  [id number]  MEDICAL RECORD NO.:  [id number]      DATE OF BIRTH:  04-Jul-1986  PHYSICIAN:  Theodoro Kos, DO       VISIT DATE:  08/10/2014                                  OFFICE VISIT   The patient is a 28 year old female who is here for followup on her right great toe diabetic foot infection.  She underwent treatment with the noncontact cast and did very well.  The skin is a little bit dry all over but the wound is healed.  MEDICATIONS:  No change.  REVIEW OF SYSTEMS:  Negative.  SOCIAL HISTORY:  She just moved into a new home but no other changes.  On exam, GENERAL:  She is alert, oriented, cooperative, not in any distress.  She is very pleasant.  Very pleased with her progress. LUNGS:  Her breathing is unlabored. HEART:  Her heart rate is regular.  Her skin is quite dry but intact and the wound has healed.  This is concerning with the dry skin for breakdown, so I have stressed the importance of lotion, foot care, daily routine care, podiatry care yearly, elevation in blood sugar control, and proper footwear and we will see her back as needed if she is healed.     Theodoro Kos, DO     CS/MEDQ  D:  08/10/2014  T:  08/11/2014  Job:  [id number]

## 2014-09-22 ENCOUNTER — Ambulatory Visit (INDEPENDENT_AMBULATORY_CARE_PROVIDER_SITE_OTHER): Payer: 59 | Admitting: Family Medicine

## 2014-09-22 ENCOUNTER — Encounter: Payer: Self-pay | Admitting: Family Medicine

## 2014-09-22 VITALS — BP 114/76 | HR 96 | Temp 98.4°F | Resp 18 | Wt 214.0 lb

## 2014-09-22 DIAGNOSIS — J4551 Severe persistent asthma with (acute) exacerbation: Secondary | ICD-10-CM

## 2014-09-22 DIAGNOSIS — J029 Acute pharyngitis, unspecified: Secondary | ICD-10-CM

## 2014-09-22 DIAGNOSIS — H26492 Other secondary cataract, left eye: Secondary | ICD-10-CM | POA: Insufficient documentation

## 2014-09-22 LAB — COMPLETE METABOLIC PANEL WITH GFR
ALK PHOS: 133 U/L — AB (ref 39–117)
ALT: 10 U/L (ref 0–35)
AST: 11 U/L (ref 0–37)
Albumin: 3.5 g/dL (ref 3.5–5.2)
BILIRUBIN TOTAL: 0.4 mg/dL (ref 0.2–1.2)
BUN: 14 mg/dL (ref 6–23)
CO2: 29 meq/L (ref 19–32)
CREATININE: 1.12 mg/dL — AB (ref 0.50–1.10)
Calcium: 9.2 mg/dL (ref 8.4–10.5)
Chloride: 97 mEq/L (ref 96–112)
GFR, EST AFRICAN AMERICAN: 77 mL/min
GFR, Est Non African American: 67 mL/min
Glucose, Bld: 509 mg/dL (ref 70–99)
Potassium: 5.7 mEq/L — ABNORMAL HIGH (ref 3.5–5.3)
SODIUM: 131 meq/L — AB (ref 135–145)
Total Protein: 6.3 g/dL (ref 6.0–8.3)

## 2014-09-22 LAB — RAPID STREP SCREEN (MED CTR MEBANE ONLY): STREPTOCOCCUS, GROUP A SCREEN (DIRECT): POSITIVE — AB

## 2014-09-22 MED ORDER — AZITHROMYCIN 250 MG PO TABS: ORAL_TABLET | ORAL | Status: DC

## 2014-09-22 MED ORDER — PREDNISONE 20 MG PO TABS: ORAL_TABLET | ORAL | Status: DC

## 2014-09-22 NOTE — Progress Notes (Signed)
Subjective:    Patient ID: [name], female    DOB: 10-Nov-1985, 28 y.o.   MRN: [id number]  HPI  patient symptoms  Began approximately 3-4 days ago. It began with a sore throat. However now she has a dry nonproductive cough with increasing shortness of breath and audible wheezing. She has a history of moderately persistent asthma. She's been using her asthma 3-4 nights a week even on Advair. Today on examination she is audibly wheezing. There is slightly increased accessory muscle use. There is no evidence of respiratory distress. Her strep screen is positive. Past Medical History  Diagnosis Date  . Asthma   . Tachycardia     baseline tachycardia   . Retinopathy due to secondary diabetes   . Diabetic gastroparesis   . 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 Diabetic neuropathy, type I diabetes mellitus   . Polyneuropathy in diabetes(357.2)   . Anxiety   . Hypertension   . Depression   . Asthma 11/10/2013  . Headache(784.0)   . Kidney stones    Past Surgical History  Procedure Laterality Date  . Cholecystectomy    . Cesarean section    . Refractive surgery     Current Outpatient Prescriptions on File Prior to Visit  Medication Sig Dispense Refill  . albuterol (PROVENTIL HFA;VENTOLIN HFA) 108 (90 BASE) MCG/ACT inhaler Inhale 1-2 puffs into the lungs every 6 (six) hours as needed for wheezing or shortness of breath. 1 Inhaler 0  . albuterol (PROVENTIL) (2.5 MG/3ML) 0.083% nebulizer solution Take 3 mLs (2.5 mg total) by nebulization every 4 (four) hours as needed for wheezing or shortness of breath. 30 vial 1  . ARIPiprazole (ABILIFY) 30 MG tablet Take 30 mg by mouth at bedtime.    . brimonidine-timolol (COMBIGAN) 0.2-0.5 % ophthalmic solution Place 1 drop into both eyes every 12 (twelve) hours. For Glaucoma    . busPIRone (BUSPAR) 30 MG tablet Take 1 tablet (30 mg total) by mouth 2 (two) times daily. For anxiety 60 tablet 0  . clonazePAM  (KLONOPIN) 0.5 MG tablet Take 1 tablet (0.5 mg total) by mouth 2 (two) times daily as needed (anxiety). 30 tablet 0  . fluticasone-salmeterol (ADVAIR HFA) 230-21 MCG/ACT inhaler Inhale 1 puff into the lungs 2 (two) times daily. For asthma 1 Inhaler 12  . hydrOXYzine (ATARAX/VISTARIL) 50 MG tablet Take 1 tablet (50 mg total) by mouth 2 (two) times daily as needed for anxiety. 45 tablet 0  . ibuprofen (ADVIL,MOTRIN) 200 MG tablet Take 3-4 tablets (600-800 mg total) by mouth every 6 (six) hours as needed for moderate pain. 30 tablet 0  . insulin glargine (LANTUS) 100 UNIT/ML injection Inject 38 Units into the skin at bedtime. When NOT on insulin pump    . Insulin Human (INSULIN PUMP) SOLN Inject 1 each into the skin 3 times daily with meals, bedtime and 2 AM. 38 units/day basal; bolus sliding scale    . montelukast (SINGULAIR) 10 MG tablet Take 1 tablet (10 mg total) by mouth at bedtime. For allergies     No current facility-administered medications on file prior to visit.   Allergies  Allergen Reactions  . Ceftin Rash   History   Social History  . Marital Status: Married    Spouse Name: N/A    Number of Children: N/A  . Years of Education: N/A   Occupational History  . Not on file.   Social History Main Topics  . Smoking  status: Never Smoker   . Smokeless tobacco: Never Used  . Alcohol Use: No  . Drug Use: No  . Sexual Activity:    Partners: Male    Patent examiner Protection: IUD, Pill   Other Topics Concern  . Not on file   Social History Narrative   Pt has a daughter and boyfriend.             Review of Systems  All other systems reviewed and are negative.      Objective:   Physical Exam  HENT:  Right Ear: External ear normal.  Left Ear: External ear normal.  Nose: Nose normal.  Mouth/Throat: No oropharyngeal exudate.  Eyes: Conjunctivae are normal. No scleral icterus.  Neck: Neck supple.  Cardiovascular: Normal rate, regular rhythm and normal heart sounds.    Pulmonary/Chest: She has wheezes. She has no rales.  Lymphadenopathy:    She has no cervical adenopathy.  Vitals reviewed.         Assessment & Plan:  Sorethroat - Plan: Rapid strep screen, COMPLETE METABOLIC PANEL WITH GFR, azithromycin (ZITHROMAX) 250 MG tablet  Asthma with acute exacerbation, severe persistent - Plan: predniSONE (DELTASONE) 20 MG tablet   Patient has strep throat and an asthma exacerbation. Begin a Z-Pak for her strep throat. Start prednisone taper pack for her asthma exacerbation. Use albuterol 2 puffs every 4-6 hours as needed. Discontinue Advair and replaced with Dulera 200/5 2 puff inh bid.

## 2014-10-05 ENCOUNTER — Encounter: Payer: Self-pay | Admitting: *Deleted

## 2014-10-06 ENCOUNTER — Encounter: Payer: Self-pay | Admitting: Obstetrics & Gynecology

## 2014-10-13 ENCOUNTER — Encounter (HOSPITAL_COMMUNITY): Payer: Self-pay | Admitting: Emergency Medicine

## 2014-10-13 ENCOUNTER — Emergency Department (HOSPITAL_COMMUNITY)
Admission: EM | Admit: 2014-10-13 | Discharge: 2014-10-13 | Disposition: A | Discharge status: Home / Self Care | Payer: 59 | Attending: Emergency Medicine | Admitting: Emergency Medicine

## 2014-10-13 ENCOUNTER — Emergency Department (HOSPITAL_COMMUNITY): Payer: 59

## 2014-10-13 DIAGNOSIS — N390 Urinary tract infection, site not specified: Secondary | ICD-10-CM | POA: Diagnosis not present

## 2014-10-13 DIAGNOSIS — R739 Hyperglycemia, unspecified: Secondary | ICD-10-CM

## 2014-10-13 DIAGNOSIS — F419 Anxiety disorder, unspecified: Secondary | ICD-10-CM | POA: Insufficient documentation

## 2014-10-13 DIAGNOSIS — E1065 Type 1 diabetes mellitus with hyperglycemia: Secondary | ICD-10-CM | POA: Diagnosis not present

## 2014-10-13 DIAGNOSIS — E1040 Type 1 diabetes mellitus with diabetic neuropathy, unspecified: Secondary | ICD-10-CM | POA: Diagnosis not present

## 2014-10-13 DIAGNOSIS — I10 Essential (primary) hypertension: Secondary | ICD-10-CM | POA: Insufficient documentation

## 2014-10-13 DIAGNOSIS — Z79899 Other long term (current) drug therapy: Secondary | ICD-10-CM | POA: Insufficient documentation

## 2014-10-13 DIAGNOSIS — Z7951 Long term (current) use of inhaled steroids: Secondary | ICD-10-CM | POA: Insufficient documentation

## 2014-10-13 DIAGNOSIS — J45909 Unspecified asthma, uncomplicated: Secondary | ICD-10-CM | POA: Diagnosis not present

## 2014-10-13 DIAGNOSIS — Z87442 Personal history of urinary calculi: Secondary | ICD-10-CM | POA: Diagnosis not present

## 2014-10-13 DIAGNOSIS — F329 Major depressive disorder, single episode, unspecified: Secondary | ICD-10-CM | POA: Diagnosis not present

## 2014-10-13 DIAGNOSIS — Z794 Long term (current) use of insulin: Secondary | ICD-10-CM | POA: Diagnosis not present

## 2014-10-13 LAB — URINALYSIS, ROUTINE W REFLEX MICROSCOPIC
Bilirubin Urine: NEGATIVE
Glucose, UA: >1000 mg/dL — AB
Ketones, ur: NEGATIVE mg/dL
Nitrite: NEGATIVE
PROTEIN: 30 mg/dL — AB
Specific Gravity, Urine: 1.022 (ref 1.005–1.030)
Urobilinogen, UA: 0.2 mg/dL (ref 0.0–1.0)
pH: 5 (ref 5.0–8.0)

## 2014-10-13 LAB — URINE MICROSCOPIC-ADD ON

## 2014-10-13 LAB — COMPREHENSIVE METABOLIC PANEL
ALBUMIN: 3.4 g/dL — AB (ref 3.5–5.2)
ALK PHOS: 122 U/L — AB (ref 39–117)
ALT: 14 U/L (ref 0–35)
AST: 16 U/L (ref 0–37)
Anion gap: 8 (ref 5–15)
BUN: 17 mg/dL (ref 6–23)
CALCIUM: 9.2 mg/dL (ref 8.4–10.5)
CO2: 28 mmol/L (ref 19–32)
Chloride: 97 mEq/L (ref 96–112)
Creatinine, Ser: 1.19 mg/dL — ABNORMAL HIGH (ref 0.50–1.10)
GFR calc Af Amer: 71 mL/min — ABNORMAL LOW (ref >90)
GFR calc non Af Amer: 61 mL/min — ABNORMAL LOW (ref >90)
Glucose, Bld: 368 mg/dL — ABNORMAL HIGH (ref 70–99)
Potassium: 4.7 mmol/L (ref 3.5–5.1)
SODIUM: 133 mmol/L — AB (ref 135–145)
Total Bilirubin: 0.4 mg/dL (ref 0.3–1.2)
Total Protein: 6.1 g/dL (ref 6.0–8.3)

## 2014-10-13 LAB — CBC WITH DIFFERENTIAL/PLATELET
BASOS ABS: 0 10*3/uL (ref 0.0–0.1)
Basophils Relative: 0 % (ref 0–1)
EOS PCT: 4 % (ref 0–5)
Eosinophils Absolute: 0.4 10*3/uL (ref 0.0–0.7)
HCT: 36.4 % (ref 36.0–46.0)
Hemoglobin: 12.2 g/dL (ref 12.0–15.0)
Lymphocytes Relative: 28 % (ref 12–46)
Lymphs Abs: 2.4 10*3/uL (ref 0.7–4.0)
MCH: 27.8 pg (ref 26.0–34.0)
MCHC: 33.5 g/dL (ref 30.0–36.0)
MCV: 82.9 fL (ref 78.0–100.0)
Monocytes Absolute: 0.5 10*3/uL (ref 0.1–1.0)
Monocytes Relative: 6 % (ref 3–12)
NEUTROS ABS: 5.3 10*3/uL (ref 1.7–7.7)
Neutrophils Relative %: 62 % (ref 43–77)
PLATELETS: 292 10*3/uL (ref 150–400)
RBC: 4.39 MIL/uL (ref 3.87–5.11)
RDW: 12.7 % (ref 11.5–15.5)
WBC: 8.7 10*3/uL (ref 4.0–10.5)

## 2014-10-13 LAB — CBG MONITORING, ED
Glucose-Capillary: 334 mg/dL — ABNORMAL HIGH (ref 70–99)
Glucose-Capillary: 324 mg/dL — ABNORMAL HIGH (ref 70–99)
Glucose-Capillary: 293 mg/dL — ABNORMAL HIGH (ref 70–99)

## 2014-10-13 MED ORDER — SODIUM CHLORIDE 0.9 % IV BOLUS (SEPSIS)
1000.0000 mL | Freq: Once | INTRAVENOUS | Status: AC
  Administered 2014-10-13: 1000 mL via INTRAVENOUS

## 2014-10-13 MED ORDER — CIPROFLOXACIN HCL 500 MG PO TABS
500.0000 mg | ORAL_TABLET | Freq: Once | ORAL | Status: AC
  Administered 2014-10-13: 500 mg via ORAL
  Filled 2014-10-13: qty 1

## 2014-10-13 MED ORDER — CIPROFLOXACIN HCL 500 MG PO TABS: 500.0000 mg | ORAL_TABLET | Freq: Two times a day (BID) | ORAL | Status: DC

## 2014-10-13 NOTE — ED Provider Notes (Signed)
CSN: [id number]     Arrival date & time 10/13/14  1228 History   First MD Initiated Contact with Patient 10/13/14 1643     Chief Complaint  Patient presents with  . Hyperglycemia     (Consider location/radiation/quality/duration/timing/severity/associated sxs/prior Treatment) Patient is a 29 y.o. female presenting with hyperglycemia. The history is provided by the patient (the pt complains that her sugar has been high).  Hyperglycemia Severity:  Moderate Onset quality:  Gradual Timing:  Constant Progression:  Waxing and waning Chronicity:  New Diabetes status:  Controlled with insulin Context: not change in medication   Relieved by:  Nothing Associated symptoms: no abdominal pain, no chest pain and no fatigue     Past Medical History  Diagnosis Date  . Asthma   . Tachycardia     baseline tachycardia   . Retinopathy due to secondary diabetes   . Diabetic gastroparesis   . 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 Diabetic neuropathy, type I diabetes mellitus   . Polyneuropathy in diabetes(357.2)   . Anxiety   . Hypertension   . Depression   . Asthma 11/10/2013  . Headache(784.0)   . Kidney stones    Past Surgical History  Procedure Laterality Date  . Cholecystectomy    . Cesarean section    . Refractive surgery     Family History  Problem Relation Age of Onset  . Hypertension     History  Substance Use Topics  . Smoking status: Never Smoker   . Smokeless tobacco: Never Used  . Alcohol Use: No   OB History    Gravida Para Term Preterm AB TAB SAB Ectopic Multiple Living   1 1        1      Review of Systems  Constitutional: Negative for appetite change and fatigue.  HENT: Negative for congestion, ear discharge and sinus pressure.   Eyes: Negative for discharge.  Respiratory: Negative for cough.   Cardiovascular: Negative for chest pain.  Gastrointestinal: Negative for abdominal pain and diarrhea.  Genitourinary: Negative  for frequency and hematuria.  Musculoskeletal: Negative for back pain.  Skin: Negative for rash.  Neurological: Negative for seizures and headaches.  Psychiatric/Behavioral: Negative for hallucinations.      Allergies  Ceftin  Home Medications   Prior to Admission medications   Medication Sig Start Date End Date Taking? Authorizing Provider  albuterol (PROVENTIL HFA;VENTOLIN HFA) 108 (90 BASE) MCG/ACT inhaler Inhale 1-2 puffs into the lungs every 6 (six) hours as needed for wheezing or shortness of breath. 08/08/14  Yes Pamella Pert, MD  albuterol (PROVENTIL) (2.5 MG/3ML) 0.083% nebulizer solution Take 3 mLs (2.5 mg total) by nebulization every 4 (four) hours as needed for wheezing or shortness of breath. 08/08/14  Yes Pamella Pert, MD  ARIPiprazole (ABILIFY) 30 MG tablet Take 30 mg by mouth at bedtime.   Yes Historical Provider, MD  brimonidine-timolol (COMBIGAN) 0.2-0.5 % ophthalmic solution Place 1 drop into both eyes every 12 (twelve) hours. For Glaucoma 06/03/14  Yes Encarnacion Slates, NP  busPIRone (BUSPAR) 30 MG tablet Take 1 tablet (30 mg total) by mouth 2 (two) times daily. For anxiety 06/03/14  Yes Encarnacion Slates, NP  clonazePAM (KLONOPIN) 0.5 MG tablet Take 1 tablet (0.5 mg total) by mouth 2 (two) times daily as needed (anxiety). 06/03/14  Yes Encarnacion Slates, NP  DULoxetine (CYMBALTA) 30 MG capsule Take 90 mg by mouth daily.   Yes Historical Provider, MD  fluticasone (FLONASE) 50 MCG/ACT nasal spray Place 2 sprays into both nostrils daily as needed for allergies or rhinitis.   Yes Historical Provider, MD  hydrOXYzine (ATARAX/VISTARIL) 50 MG tablet Take 1 tablet (50 mg total) by mouth 2 (two) times daily as needed for anxiety. 06/03/14  Yes Encarnacion Slates, NP  ibuprofen (ADVIL,MOTRIN) 200 MG tablet Take 3-4 tablets (600-800 mg total) by mouth every 6 (six) hours as needed for moderate pain. 06/03/14  Yes Encarnacion Slates, NP  insulin glargine (LANTUS) 100 UNIT/ML injection Inject 38  Units into the skin at bedtime. When NOT on insulin pump   Yes Historical Provider, MD  Insulin Human (INSULIN PUMP) SOLN Inject 1 each into the skin 3 times daily with meals, bedtime and 2 AM. 38 units/day basal; bolus sliding scale   Yes Historical Provider, MD  lisinopril (PRINIVIL,ZESTRIL) 10 MG tablet Take 10 mg by mouth daily.   Yes Historical Provider, MD  lithium 300 MG tablet Take 600 mg by mouth every evening.    Yes Historical Provider, MD  mometasone-formoterol (DULERA) 100-5 MCG/ACT AERO Inhale 2 puffs into the lungs 2 (two) times daily.   Yes Historical Provider, MD  montelukast (SINGULAIR) 10 MG tablet Take 1 tablet (10 mg total) by mouth at bedtime. For allergies 06/03/14  Yes Encarnacion Slates, NP  azithromycin (ZITHROMAX) 250 MG tablet 2 tabs poqday1, 1 tab poqday 2-5 Patient not taking: Reported on 10/13/2014 09/22/14   [name], MD  ciprofloxacin (CIPRO) 500 MG tablet Take 1 tablet (500 mg total) by mouth 2 (two) times daily. One po bid x 7 days 10/13/14   Maudry Diego, MD  fluticasone-salmeterol (ADVAIR HFA) 230-21 MCG/ACT inhaler Inhale 1 puff into the lungs 2 (two) times daily. For asthma 06/03/14   Encarnacion Slates, NP  predniSONE (DELTASONE) 20 MG tablet 3 tabs poqday 1-2, 2 tabs poqday 3-4, 1 tab poqday 5-6 Patient not taking: Reported on 10/13/2014 09/22/14   [name], MD   BP 131/82 mmHg  Pulse 102  Temp(Src) 98.1 F (36.7 C) (Oral)  Resp 12  SpO2 99% Physical Exam  Constitutional: She is oriented to person, place, and time. She appears well-developed.  HENT:  Head: Normocephalic.  Eyes: Conjunctivae and EOM are normal. No scleral icterus.  Neck: Neck supple. No thyromegaly present.  Cardiovascular: Normal rate and regular rhythm.  Exam reveals no gallop and no friction rub.   No murmur heard. Pulmonary/Chest: No stridor. She has no wheezes. She has no rales. She exhibits no tenderness.  Abdominal: She exhibits no distension. There is no tenderness. There  is no rebound.  Musculoskeletal: Normal range of motion. She exhibits no edema.  Lymphadenopathy:    She has no cervical adenopathy.  Neurological: She is oriented to person, place, and time. She exhibits normal muscle tone. Coordination normal.  Skin: No rash noted. No erythema.  Psychiatric: She has a normal mood and affect. Her behavior is normal.    ED Course  Procedures (including critical care time) Labs Review Labs Reviewed  COMPREHENSIVE METABOLIC PANEL - Abnormal; Notable for the following:    Sodium 133 (*)    Glucose, Bld 368 (*)    Creatinine, Ser 1.19 (*)    Albumin 3.4 (*)    Alkaline Phosphatase 122 (*)    GFR calc non Af Amer 61 (*)    GFR calc Af Amer 71 (*)    All other components within normal limits  URINALYSIS, ROUTINE W REFLEX MICROSCOPIC -  Abnormal; Notable for the following:    APPearance CLOUDY (*)    Glucose, UA >1000 (*)    Hgb urine dipstick SMALL (*)    Protein, ur 30 (*)    Leukocytes, UA SMALL (*)    All other components within normal limits  URINE MICROSCOPIC-ADD ON - Abnormal; Notable for the following:    Squamous Epithelial / LPF MANY (*)    Bacteria, UA MANY (*)    All other components within normal limits  CBG MONITORING, ED - Abnormal; Notable for the following:    Glucose-Capillary 334 (*)    All other components within normal limits  CBG MONITORING, ED - Abnormal; Notable for the following:    Glucose-Capillary 324 (*)    All other components within normal limits  CBG MONITORING, ED - Abnormal; Notable for the following:    Glucose-Capillary 293 (*)    All other components within normal limits  URINE CULTURE  CBC WITH DIFFERENTIAL    Imaging Review Dg Chest 2 View  10/13/2014   CLINICAL DATA:  Three day history of hyperglycemia.  EXAM: CHEST  2 VIEW  COMPARISON:  04/30/2012  FINDINGS: The heart size and mediastinal contours are within normal limits. Both lungs are clear. The visualized skeletal structures are unremarkable.   IMPRESSION: Normal chest x-ray.   Electronically Signed   By: Kalman Jewels M.D.   On: 10/13/2014 17:28     EKG Interpretation None      MDM   Final diagnoses:  Hyperglycemia  UTI (lower urinary tract infection)    Uti,   Elevated glucose,  tx with cipro and follow up with pcp    Maudry Diego, MD 10/13/14 2002

## 2014-10-13 NOTE — ED Notes (Signed)
Pt had arm on pillow and pt fluids had stop advised pt keep arm down so fluids will flow.  PA notfied CBG will be collected once fluids complete.

## 2014-10-13 NOTE — ED Notes (Addendum)
Pt reports she is type 1 diabetic; sugars in 600s for few days. She vomited this morning and felt dehydrated and heart racing. States sugars have come down to 400s now. Uses insulin pump. Currently 334 in triage.

## 2014-10-13 NOTE — Discharge Instructions (Signed)
Plenty of fluids.   Follow up with your md in 2-3 days for recheck

## 2014-10-13 NOTE — ED Notes (Signed)
Patient transported to X-ray 

## 2014-10-13 NOTE — ED Notes (Signed)
CBG Taken = 293

## 2014-10-15 LAB — URINE CULTURE
Colony Count: 30000
Special Requests: NORMAL

## 2014-10-28 ENCOUNTER — Other Ambulatory Visit: Payer: Self-pay | Admitting: Family Medicine

## 2014-11-02 ENCOUNTER — Telehealth: Payer: Self-pay | Admitting: Family Medicine

## 2014-11-02 NOTE — Telephone Encounter (Signed)
That was 3 weeks ago.  NTBS, sugars are out of control.

## 2014-11-02 NOTE — Telephone Encounter (Signed)
Pt aware and appt made.

## 2014-11-02 NOTE — Telephone Encounter (Signed)
Patient says she was in the er last week, and she was dehydrated, and diagnosed with a uti. Says that the med that was prescribed has not taken care of it completley, and would like to know if we can call in something for her if possible  [phone] [facility] op pharmacy

## 2014-11-03 ENCOUNTER — Ambulatory Visit (INDEPENDENT_AMBULATORY_CARE_PROVIDER_SITE_OTHER): Payer: 59 | Admitting: Family Medicine

## 2014-11-03 ENCOUNTER — Encounter: Payer: Self-pay | Admitting: Family Medicine

## 2014-11-03 VITALS — BP 130/74 | HR 80 | Temp 97.7°F | Resp 16 | Ht 66.0 in | Wt 225.0 lb

## 2014-11-03 DIAGNOSIS — R358 Other polyuria: Secondary | ICD-10-CM

## 2014-11-03 DIAGNOSIS — R3589 Other polyuria: Secondary | ICD-10-CM

## 2014-11-03 DIAGNOSIS — R300 Dysuria: Secondary | ICD-10-CM

## 2014-11-03 LAB — URINALYSIS, MICROSCOPIC ONLY
Casts: NONE SEEN
Crystals: NONE SEEN

## 2014-11-03 LAB — URINALYSIS, ROUTINE W REFLEX MICROSCOPIC
Bilirubin Urine: NEGATIVE
Glucose, UA: 100 mg/dL — AB
Hgb urine dipstick: NEGATIVE
Ketones, ur: NEGATIVE mg/dL
Nitrite: NEGATIVE
Protein, ur: NEGATIVE mg/dL
Specific Gravity, Urine: 1.015 (ref 1.005–1.030)
UROBILINOGEN UA: 0.2 mg/dL (ref 0.0–1.0)
pH: 7 (ref 5.0–8.0)

## 2014-11-03 NOTE — Addendum Note (Signed)
Addended by: Shary Decamp B on: 11/03/2014 02:39 PM   Modules accepted: Orders

## 2014-11-03 NOTE — Progress Notes (Signed)
Subjective:    Patient ID: [name], female    DOB: 03-04-1986, 29 y.o.   MRN: [id number]  HPI Patient call yesterday requesting a refill on antibiotics she was given in the emergency room in early urinary tract infection. At that time urinalysis showed trace leukocyte esterase. Urine culture revealed 30,000 colonies but appear to be contamination. Cipro was ineffective and the patient denies any improvement of her symptoms on the medication. Of note she has insulin-dependent diabetes mellitus which is poorly controlled. The last 3 blood sugars I have are greater than 400, greater than 500, and greater than 360 in the emergency room in January. The patient reports polyuria, urinary urgency, and polydipsia. Urinalysis today again shows trace leukocyte esterase but is otherwise unremarkable. Past Medical History  Diagnosis Date  . Asthma   . Tachycardia     baseline tachycardia   . Retinopathy due to secondary diabetes   . Diabetic gastroparesis   . 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 Diabetic neuropathy, type I diabetes mellitus   . Polyneuropathy in diabetes(357.2)   . Anxiety   . Hypertension   . Depression   . Asthma 11/10/2013  . Headache(784.0)   . Kidney stones    Past Surgical History  Procedure Laterality Date  . Cholecystectomy    . Cesarean section    . Refractive surgery     Current Outpatient Prescriptions on File Prior to Visit  Medication Sig Dispense Refill  . albuterol (PROVENTIL HFA;VENTOLIN HFA) 108 (90 BASE) MCG/ACT inhaler Inhale 1-2 puffs into the lungs every 6 (six) hours as needed for wheezing or shortness of breath. 1 Inhaler 0  . albuterol (PROVENTIL) (2.5 MG/3ML) 0.083% nebulizer solution Take 3 mLs (2.5 mg total) by nebulization every 4 (four) hours as needed for wheezing or shortness of breath. 30 vial 1  . ARIPiprazole (ABILIFY) 30 MG tablet Take 30 mg by mouth at bedtime.    . brimonidine-timolol (COMBIGAN)  0.2-0.5 % ophthalmic solution Place 1 drop into both eyes every 12 (twelve) hours. For Glaucoma    . busPIRone (BUSPAR) 30 MG tablet Take 1 tablet (30 mg total) by mouth 2 (two) times daily. For anxiety 60 tablet 0  . DULoxetine (CYMBALTA) 30 MG capsule Take 90 mg by mouth daily.    . fluticasone (FLONASE) 50 MCG/ACT nasal spray Place 2 sprays into both nostrils daily as needed for allergies or rhinitis.    . hydrOXYzine (ATARAX/VISTARIL) 50 MG tablet Take 1 tablet (50 mg total) by mouth 2 (two) times daily as needed for anxiety. 45 tablet 0  . ibuprofen (ADVIL,MOTRIN) 200 MG tablet Take 3-4 tablets (600-800 mg total) by mouth every 6 (six) hours as needed for moderate pain. 30 tablet 0  . insulin glargine (LANTUS) 100 UNIT/ML injection Inject 38 Units into the skin at bedtime. When NOT on insulin pump    . Insulin Human (INSULIN PUMP) SOLN Inject 1 each into the skin 3 times daily with meals, bedtime and 2 AM. 38 units/day basal; bolus sliding scale    . lisinopril (PRINIVIL,ZESTRIL) 10 MG tablet TAKE 1 TABLET BY MOUTH EVERY MORNING 90 tablet 1  . lithium 300 MG tablet Take 600 mg by mouth every evening.     . mometasone-formoterol (DULERA) 100-5 MCG/ACT AERO Inhale 2 puffs into the lungs 2 (two) times daily.    . montelukast (SINGULAIR) 10 MG tablet Take 1 tablet (10 mg total) by mouth at bedtime. For allergies  No current facility-administered medications on file prior to visit.   Allergies  Allergen Reactions  . Ceftin Rash   History   Social History  . Marital Status: Married    Spouse Name: N/A    Number of Children: N/A  . Years of Education: N/A   Occupational History  . Not on file.   Social History Main Topics  . Smoking status: Never Smoker   . Smokeless tobacco: Never Used  . Alcohol Use: No  . Drug Use: No  . Sexual Activity:    Partners: Male    Patent examiner Protection: IUD, Pill   Other Topics Concern  . Not on file   Social History Narrative   Pt has a  daughter and boyfriend.             Review of Systems  All other systems reviewed and are negative.      Objective:   Physical Exam  Cardiovascular: Normal rate, regular rhythm and normal heart sounds.   Pulmonary/Chest: Effort normal and breath sounds normal. No respiratory distress. She has no wheezes. She has no rales.  Vitals reviewed.         Assessment & Plan:  Dysuria - Plan: Urinalysis, Routine w reflex microscopic  Polyuria  I do not believe the patient has a urinary tract infection. I believe the patient's polyuria is most likely due to her poorly controlled diabetes. I spent approximately 30 minutes with the patient stressing the need for the patient to be compliant with her diabetes management. She states that her blood sugars typically 200 in the mornings and 300s in the afternoons. This is poorly controlled and I believe it is actually much worse and she just does not want to get that. The patient is scheduled to see her endocrinologist next week. I strongly encouraged her to do this. I explained to the patient that she is could die from diabetes if she doesn't get better control. She also is at high risk for severe complications from diabetes. The patient seems unconcerned.  The other possibility would be overactive bladder. ID get the patient samples of Vesicare 5 mg by mouth daily as a trial just for 1 week. If her symptoms do not improve on this I believe that would prove that this is the diabetes. I will also send a urine culture to be complete.

## 2014-11-05 LAB — URINE CULTURE: Colony Count: 30000

## 2014-11-10 ENCOUNTER — Encounter: Payer: Self-pay | Admitting: *Deleted

## 2015-02-04 ENCOUNTER — Encounter: Payer: Self-pay | Admitting: Family Medicine

## 2015-02-04 ENCOUNTER — Ambulatory Visit (INDEPENDENT_AMBULATORY_CARE_PROVIDER_SITE_OTHER): Payer: Medicaid Other | Admitting: Family Medicine

## 2015-02-04 VITALS — BP 118/80 | HR 76 | Temp 98.3°F | Resp 16 | Ht 66.0 in | Wt 220.0 lb

## 2015-02-04 DIAGNOSIS — L97529 Non-pressure chronic ulcer of other part of left foot with unspecified severity: Secondary | ICD-10-CM

## 2015-02-04 DIAGNOSIS — L309 Dermatitis, unspecified: Secondary | ICD-10-CM | POA: Diagnosis not present

## 2015-02-04 DIAGNOSIS — E08621 Diabetes mellitus due to underlying condition with foot ulcer: Secondary | ICD-10-CM | POA: Diagnosis not present

## 2015-02-04 NOTE — Progress Notes (Signed)
Subjective:    Patient ID: [name], female    DOB: 07/10/1986, 29 y.o.   MRN: [id number]  HPI  Patient is a 29 year old white female with a history of type 1 diabetes mellitus which is poorly controlled due to noncompliance. She is currently seeing an endocrinologist. Per her report her blood sugars are running between 102 100. She is scheduled to see her endocrinologist next week. She has severe polyneuropathy in both feet. On examination today, the patient has a 1 cm pre-ulcerative callus on the plantar aspect of the left first IP joint. There is a small 4 mm ulcer that is very shallow. There is no exposed bone. There is no evidence of osteomyelitis or cellulitis. Patient never followed up with podiatrist. She is seen a wound center in the past but after the ulcer healed she never followed up with the podiatrist as recommended. She is also requesting referral back to see her dermatologist that she is seen in the past for her eczema. She is currently using fluorinated steroid creams on a daily basis with very little control of the X months on the dorsum of both ankles and on the dorsums of both feet.. Past Medical History  Diagnosis Date  . Asthma   . Tachycardia     baseline tachycardia   . Retinopathy due to secondary diabetes   . Diabetic gastroparesis   . 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 Diabetic neuropathy, type I diabetes mellitus   . Polyneuropathy in diabetes(357.2)   . Anxiety   . Hypertension   . Depression   . Asthma 11/10/2013  . Headache(784.0)   . Kidney stones    Past Surgical History  Procedure Laterality Date  . Cholecystectomy    . Cesarean section    . Refractive surgery     Current Outpatient Prescriptions on File Prior to Visit  Medication Sig Dispense Refill  . albuterol (PROVENTIL HFA;VENTOLIN HFA) 108 (90 BASE) MCG/ACT inhaler Inhale 1-2 puffs into the lungs every 6 (six) hours as needed for wheezing or  shortness of breath. 1 Inhaler 0  . albuterol (PROVENTIL) (2.5 MG/3ML) 0.083% nebulizer solution Take 3 mLs (2.5 mg total) by nebulization every 4 (four) hours as needed for wheezing or shortness of breath. 30 vial 1  . brimonidine-timolol (COMBIGAN) 0.2-0.5 % ophthalmic solution Place 1 drop into both eyes every 12 (twelve) hours. For Glaucoma    . DULoxetine (CYMBALTA) 30 MG capsule Take 90 mg by mouth daily.    . fluticasone (FLONASE) 50 MCG/ACT nasal spray Place 2 sprays into both nostrils daily as needed for allergies or rhinitis.    . hydrOXYzine (ATARAX/VISTARIL) 50 MG tablet Take 1 tablet (50 mg total) by mouth 2 (two) times daily as needed for anxiety. 45 tablet 0  . ibuprofen (ADVIL,MOTRIN) 200 MG tablet Take 3-4 tablets (600-800 mg total) by mouth every 6 (six) hours as needed for moderate pain. 30 tablet 0  . insulin glargine (LANTUS) 100 UNIT/ML injection Inject 38 Units into the skin at bedtime. When NOT on insulin pump    . Insulin Human (INSULIN PUMP) SOLN Inject 1 each into the skin 3 times daily with meals, bedtime and 2 AM. 38 units/day basal; bolus sliding scale    . lisinopril (PRINIVIL,ZESTRIL) 10 MG tablet TAKE 1 TABLET BY MOUTH EVERY MORNING 90 tablet 1  . mometasone-formoterol (DULERA) 100-5 MCG/ACT AERO Inhale 2 puffs into the lungs 2 (two) times daily.    Marland Kitchen  montelukast (SINGULAIR) 10 MG tablet Take 1 tablet (10 mg total) by mouth at bedtime. For allergies     No current facility-administered medications on file prior to visit.   Allergies  Allergen Reactions  . Ceftin Rash   History   Social History  . Marital Status: Married    Spouse Name: N/A  . Number of Children: N/A  . Years of Education: N/A   Occupational History  . Not on file.   Social History Main Topics  . Smoking status: Never Smoker   . Smokeless tobacco: Never Used  . Alcohol Use: No  . Drug Use: No  . Sexual Activity:    Partners: Male    Patent examiner Protection: IUD, Pill   Other  Topics Concern  . Not on file   Social History Narrative   Pt has a daughter and boyfriend.              Review of Systems  All other systems reviewed and are negative.      Objective:   Physical Exam  Cardiovascular: Normal rate, regular rhythm and normal heart sounds.   Pulmonary/Chest: Effort normal and breath sounds normal. No respiratory distress. She has no wheezes. She has no rales.  Abdominal: Soft. Bowel sounds are normal.  Skin: Rash noted. There is erythema.  Vitals reviewed.         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 I will schedule the patient to see a podiatrist. This seems to be a pressure callus that is developing into a ulcer. Therefore I believe she needs custom molded orthotics and custom fitted shoes to help prevent this from worsening and to allow the wound to heal. I will also schedule her to see a dermatologist to help manage her eczema

## 2015-02-08 ENCOUNTER — Telehealth: Payer: Self-pay | Admitting: *Deleted

## 2015-02-08 NOTE — Telephone Encounter (Signed)
Patient wants to try to conceive and would like to have her IUD removed. Patient has been scheduled.

## 2015-02-08 NOTE — Telephone Encounter (Signed)
Patient contacted the office requesting an appointment to have her IUD removed. Patient had it placed 05/2014. Attempted to contact the patient and left message for patient to call the office.

## 2015-02-10 ENCOUNTER — Telehealth: Payer: Self-pay | Admitting: *Deleted

## 2015-02-10 NOTE — Telephone Encounter (Signed)
Patient contacted the office inquiring if an appointment for an IUD removal was available on Friday.  Attempted to contact the patient and left message on voicemail for patient to call the office.

## 2015-02-10 NOTE — Telephone Encounter (Signed)
Pt called back and aware of appt 

## 2015-02-10 NOTE — Telephone Encounter (Signed)
pt has appt scheduled on 03/25/15 at 3:45pm at [facility] with Dr. Drema Dallas, lmtrc to pt

## 2015-02-12 ENCOUNTER — Ambulatory Visit (INDEPENDENT_AMBULATORY_CARE_PROVIDER_SITE_OTHER): Payer: Medicaid Other | Admitting: Certified Nurse Midwife

## 2015-02-12 VITALS — BP 129/90 | HR 93 | Temp 98.3°F

## 2015-02-12 DIAGNOSIS — Z3169 Encounter for other general counseling and advice on procreation: Secondary | ICD-10-CM

## 2015-02-12 DIAGNOSIS — Z30432 Encounter for removal of intrauterine contraceptive device: Secondary | ICD-10-CM

## 2015-02-12 LAB — POCT URINE PREGNANCY: Preg Test, Ur: NEGATIVE

## 2015-02-12 MED ORDER — PRENATE ESSENTIAL 29-0.6-0.4-340 MG PO CAPS: 1.0000 | ORAL_CAPSULE | Freq: Every day | ORAL | Status: DC

## 2015-02-12 MED ORDER — FOLIC ACID 1 MG PO TABS: 1.0000 mg | ORAL_TABLET | Freq: Every day | ORAL | Status: DC

## 2015-02-12 NOTE — Telephone Encounter (Signed)
Patient has been scheduled for 02-12-15

## 2015-02-12 NOTE — Progress Notes (Signed)
Patient ID: [name], female   DOB: 1985-11-13, 29 y.o.   MRN: [id number]   Chief Complaint  Patient presents with  . Contraception    IUD removal    HPI [name] is a 29 y.o. female.  Desires to have Mirena IUD removed, because she desires pregnancy with her husband.  She is an IDDM with a hx of depression and HTN.  Discussed preconception counseling and pregnancy management.    HPI  Past Medical History  Diagnosis Date  . Asthma   . Tachycardia     baseline tachycardia   . Retinopathy due to secondary diabetes   . Diabetic gastroparesis   . 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 Diabetic neuropathy, type I diabetes mellitus   . Polyneuropathy in diabetes(357.2)   . Anxiety   . Hypertension   . Depression   . Asthma 11/10/2013  . Headache(784.0)   . Kidney stones     Past Surgical History  Procedure Laterality Date  . Cholecystectomy    . Cesarean section    . Refractive surgery      Family History  Problem Relation Age of Onset  . Hypertension      Social History History  Substance Use Topics  . Smoking status: Never Smoker   . Smokeless tobacco: Never Used  . Alcohol Use: No    Allergies  Allergen Reactions  . Ceftin Rash    Current Outpatient Prescriptions  Medication Sig Dispense Refill  . albuterol (PROVENTIL HFA;VENTOLIN HFA) 108 (90 BASE) MCG/ACT inhaler Inhale 1-2 puffs into the lungs every 6 (six) hours as needed for wheezing or shortness of breath. 1 Inhaler 0  . albuterol (PROVENTIL) (2.5 MG/3ML) 0.083% nebulizer solution Take 3 mLs (2.5 mg total) by nebulization every 4 (four) hours as needed for wheezing or shortness of breath. 30 vial 1  . brimonidine-timolol (COMBIGAN) 0.2-0.5 % ophthalmic solution Place 1 drop into both eyes every 12 (twelve) hours. For Glaucoma    . DULoxetine (CYMBALTA) 30 MG capsule Take 90 mg by mouth daily.    . fluticasone (FLONASE) 50 MCG/ACT nasal spray Place 2  sprays into both nostrils daily as needed for allergies or rhinitis.    Marland Kitchen ibuprofen (ADVIL,MOTRIN) 200 MG tablet Take 3-4 tablets (600-800 mg total) by mouth every 6 (six) hours as needed for moderate pain. 30 tablet 0  . insulin glargine (LANTUS) 100 UNIT/ML injection Inject 38 Units into the skin at bedtime. When NOT on insulin pump    . Insulin Human (INSULIN PUMP) SOLN Inject 1 each into the skin 3 times daily with meals, bedtime and 2 AM. 38 units/day basal; bolus sliding scale    . lisinopril (PRINIVIL,ZESTRIL) 10 MG tablet TAKE 1 TABLET BY MOUTH EVERY MORNING 90 tablet 1  . mometasone-formoterol (DULERA) 100-5 MCG/ACT AERO Inhale 2 puffs into the lungs 2 (two) times daily.    . montelukast (SINGULAIR) 10 MG tablet Take 1 tablet (10 mg total) by mouth at bedtime. For allergies    . QUEtiapine (SEROQUEL) 300 MG tablet Take 300 mg by mouth at bedtime.    . folic acid (FOLVITE) 1 MG tablet Take 1 tablet (1 mg total) by mouth daily. 30 tablet 6  . hydrOXYzine (ATARAX/VISTARIL) 50 MG tablet Take 1 tablet (50 mg total) by mouth 2 (two) times daily as needed for anxiety. (Patient not taking: Reported on 02/12/2015) 45 tablet 0  . Pren-Fe-Meth-FA-Omeg w/o A (PRENATE ESSENTIAL) 29-0.6-0.4-340 MG  CAPS Take 1 tablet by mouth daily. 30 capsule 12   No current facility-administered medications for this visit.    Review of Systems Review of Systems Constitutional: negative for fatigue and weight loss Respiratory: negative for cough and wheezing Cardiovascular: negative for chest pain, fatigue and palpitations, +HTN Gastrointestinal: negative for abdominal pain and change in bowel habits Genitourinary:negative Integument/breast: negative for nipple discharge Musculoskeletal:negative for myalgias Neurological: negative for gait problems and tremors Behavioral/Psych: negative for abusive relationship, depression Endocrine: negative for temperature intolerance, +IDDM     Blood pressure 129/90, pulse  93, temperature 98.3 F (36.8 C).  Physical Exam Physical Exam General:   alert  Skin:   no rash or abnormalities  Lungs:   clear to auscultation bilaterally  Heart:   regular rate and rhythm, S1, S2 normal, no murmur, click, rub or gallop  Breasts:  deferred  Abdomen:  normal findings: no organomegaly, soft, non-tender and no hernia  Pelvis:  External genitalia: normal general appearance Urinary system: urethral meatus normal and bladder without fullness, nontender Vaginal: normal without tenderness, induration or masses, +thin gray discharge with odor Cervix: normal appearance, strings not present on exam Adnexa: deferred Uterus: anteverted and non-tender, normal size    Dr. Jodi Mourning assisted with using forceps to grasp IUD strings inside of cervix.  Patient tolerated the procedure well.  Minimal bleeding from os.   60% of 15 min visit spent on counseling and coordination of care.   Data Reviewed Previous medical hx  Assessment     IUD removed Preconception counseling      Plan    Orders Placed This Encounter  Procedures  . AMB referral to maternal fetal medicine    Referral Priority:  Routine    Referral Type:  Consultation    Number of Visits Requested:  1  . POCT urine pregnancy   Meds ordered this encounter  Medications  . Pren-Fe-Meth-FA-Omeg w/o A (PRENATE ESSENTIAL) 29-0.6-0.4-340 MG CAPS    Sig: Take 1 tablet by mouth daily.    Dispense:  30 capsule    Refill:  12  . folic acid (FOLVITE) 1 MG tablet    Sig: Take 1 tablet (1 mg total) by mouth daily.    Dispense:  30 tablet    Refill:  6    Possible management options include: infertility specialist referral.  Follow up in 3 months.

## 2015-02-12 NOTE — Addendum Note (Signed)
Addended by: Carole Binning on: 02/12/2015 04:20 PM   Modules accepted: Orders

## 2015-02-16 LAB — SURESWAB, VAGINOSIS/VAGINITIS PLUS
ATOPOBIUM VAGINAE: NOT DETECTED Log (cells/mL)
C. GLABRATA, DNA: NOT DETECTED
C. albicans, DNA: DETECTED — AB
C. parapsilosis, DNA: NOT DETECTED
C. trachomatis RNA, TMA: NOT DETECTED
C. tropicalis, DNA: NOT DETECTED
LACTOBACILLUS SPECIES: NOT DETECTED Log (cells/mL)
MEGASPHAERA SPECIES: NOT DETECTED Log (cells/mL)
N. gonorrhoeae RNA, TMA: NOT DETECTED
T. vaginalis RNA, QL TMA: NOT DETECTED

## 2015-02-17 ENCOUNTER — Ambulatory Visit: Payer: Self-pay | Admitting: Certified Nurse Midwife

## 2015-02-18 ENCOUNTER — Other Ambulatory Visit: Payer: Self-pay | Admitting: *Deleted

## 2015-02-18 DIAGNOSIS — B373 Candidiasis of vulva and vagina: Secondary | ICD-10-CM

## 2015-02-18 DIAGNOSIS — B9689 Other specified bacterial agents as the cause of diseases classified elsewhere: Secondary | ICD-10-CM

## 2015-02-18 DIAGNOSIS — B3731 Acute candidiasis of vulva and vagina: Secondary | ICD-10-CM

## 2015-02-18 DIAGNOSIS — N760 Acute vaginitis: Secondary | ICD-10-CM

## 2015-02-18 MED ORDER — TINIDAZOLE 500 MG PO TABS: 1000.0000 mg | ORAL_TABLET | Freq: Every day | ORAL | Status: DC

## 2015-02-18 MED ORDER — FLUCONAZOLE 150 MG PO TABS: 150.0000 mg | ORAL_TABLET | Freq: Every day | ORAL | Status: DC

## 2015-02-24 ENCOUNTER — Ambulatory Visit (INDEPENDENT_AMBULATORY_CARE_PROVIDER_SITE_OTHER): Payer: Medicaid Other | Admitting: Podiatrist

## 2015-02-24 ENCOUNTER — Other Ambulatory Visit: Payer: Self-pay | Admitting: Family Medicine

## 2015-02-24 ENCOUNTER — Encounter: Payer: Self-pay | Admitting: Podiatry

## 2015-02-24 VITALS — BP 149/93 | HR 88 | Resp 18

## 2015-02-24 DIAGNOSIS — L89891 Pressure ulcer of other site, stage 1: Secondary | ICD-10-CM | POA: Diagnosis not present

## 2015-02-24 DIAGNOSIS — L97512 Non-pressure chronic ulcer of other part of right foot with fat layer exposed: Secondary | ICD-10-CM

## 2015-02-24 DIAGNOSIS — B351 Tinea unguium: Secondary | ICD-10-CM

## 2015-02-24 LAB — HEPATIC FUNCTION PANEL
ALK PHOS: 120 U/L — AB (ref 39–117)
ALT: 11 U/L (ref 0–35)
AST: 13 U/L (ref 0–37)
Albumin: 3.3 g/dL — ABNORMAL LOW (ref 3.5–5.2)
TOTAL PROTEIN: 5.8 g/dL — AB (ref 6.0–8.3)
Total Bilirubin: 0.2 mg/dL (ref 0.2–1.2)

## 2015-02-24 MED ORDER — TERBINAFINE HCL 250 MG PO TABS
250.0000 mg | ORAL_TABLET | Freq: Every day | ORAL | Status: DC
Start: 2015-02-24 — End: 2015-08-25

## 2015-02-24 NOTE — Progress Notes (Signed)
   Subjective:    Patient ID: [name], female    DOB: [birth year], 29 y.o.   MRN: [id number]  HPI my right big toe has a open sore on the back and i went to the wound center about two months ago and was treated and it got better and then came back about a month ago and there is no burning and throbbing and there is no numbness or tingling and i am a diabetic of about 25 years and it was draining and i have a fungus on the same big toe on my right and i was taken the lamisil but it reacted with some of my medicines and had to stop    Review of Systems  HENT: Positive for sneezing.   Eyes: Positive for redness and itching.  Skin: Positive for rash.       Change in nails  Allergic/Immunologic: Positive for environmental allergies.  All other systems reviewed and are negative.      Objective:   Physical Exam   GENERAL APPEARANCE: Alert, conversant. Appropriately groomed. No acute distress.  VASCULAR: Pedal pulses palpable at 2/4 DP and PT bilateral.  Capillary refill time is immediate to all digits,  Proximal to distal cooling it warm to warm.  Digital hair growth is present bilateral  NEUROLOGIC: sensation is decreased epicritically and protectively to 5.07 monofilament   Light touch is decreased bilateral, vibratory sensation absent bilateral. MUSCULOSKELETAL: acceptable muscle strength, tone and stability bilateral. Right hallux appears swollen compared to the left hallux.  She relates she broke the right great toe in the past and soon after developed a non healing ulcer.  DERMATOLOGIC:full thickness ulcer on the medial aspect of the right hallux measuring 37mm in diameter and 63mm in depth.  No probing to bone. No undermining, no malodor.  No redness or streaking is noted.  No sign of infection is present. Red granular base is present. No drainage noted. Mycotic toenail infection is also present right hallux.    Assessment & Plan:  Chronic ulcer right hallux medial side, mycotic  toenail infection  Plan: Debridement of the necrotic tissue was applied today Iodosorb and a dry and sterile dressing was applied. She was dispensed a air fracture walker to immobilize the foot. She will continue dressing the toe in Silvadene cream and a compressive dressing. Also discussed Lamisil for the mycotic toenails. CBC with differential and Hepatic function test is ordered and a prescription or Lamisil will be wriif the lab tests are normal. She took Lamisil in the past but discontinued it  due to medication reactions of which she has discontinued the other medications at this time...  X-rays will be taken of the right hallux at the next visit as she may have a fractured hallux that could be corrected surgically. Be seen back in 2 weeks for follow-up.

## 2015-02-24 NOTE — Patient Instructions (Signed)
Instructions for Wound Care  The most important step to healing a foot wound is to reduce the pressure on your foot - it is extremely important to stay off your foot as much as possible and wear the shoe/boot as instructed.  Cleanse your foot with saline wash or warm soapy water (dial antibacterial soap or similar).  Blot dry.  Apply prescribed medication to your wound and cover with gauze and a bandage.  May hold bandage in place with Coban (self sticky wrap), Ace bandage or tape.  You may find dressing supplies at your local [employer], [employer], drug store or medical supply store.  Your prescribed topical medication is :  Silvadene Cream (twice daily)   If you notice any foul odor, increase in pain, pus, increased swelling, red streaks or generalized redness occurring in your foot or leg-Call our office immediately to be seen.  This may be a sign of a limb or life threatening infection that will need prompt attention.  Terbinafine tablets What is this medicine? TERBINAFINE (TER bin a feen) is an antifungal medicine. It is used to treat certain kinds of fungal or yeast infections. This medicine may be used for other purposes; ask your health care provider or pharmacist if you have questions. COMMON BRAND NAME(S): Lamisil, Terbinex What should I tell my health care provider before I take this medicine? They need to know if you have any of these conditions: -drink alcoholic beverages -kidney disease -liver disease -an unusual or allergic reaction to terbinafine, other medicines, foods, dyes, or preservatives -pregnant or trying to get pregnant -breast-feeding How should I use this medicine? Take this medicine by mouth with a full glass of water. Follow the directions on the prescription label. You can take this medicine with food or on an empty stomach. Take your medicine at regular intervals. Do not take your medicine more often than directed. Do not skip doses or stop your medicine early  even if you feel better. Do not stop taking except on your doctor's advice. Talk to your pediatrician regarding the use of this medicine in children. Special care may be needed. Overdosage: If you think you have taken too much of this medicine contact a poison control center or emergency room at once. NOTE: This medicine is only for you. Do not share this medicine with others. What if I miss a dose? If you miss a dose, take it as soon as you can. If it is almost time for your next dose, take only that dose. Do not take double or extra doses. What may interact with this medicine? Do not take this medicine with any of the following medications: -thioridazine This medicine may also interact with the following medications: -beta-blockers -caffeine -cimetidine -cyclosporine -medicines for depression, anxiety, or psychotic disturbances -medicines for fungal infections like fluconazole and ketoconazole -medicines for irregular heartbeat like amiodarone, flecainide and propafenone -rifampin -warfarin This list may not describe all possible interactions. Give your health care provider a list of all the medicines, herbs, non-prescription drugs, or dietary supplements you use. Also tell them if you smoke, drink alcohol, or use illegal drugs. Some items may interact with your medicine. What should I watch for while using this medicine? Visit your doctor or health care provider regularly. Tell your doctor right away if you have nausea or vomiting, loss of appetite, stomach pain on your right upper side, yellow skin, dark urine, light stools, or are over tired. Some fungal infections need many weeks or months of treatment to cure.  If you are taking this medicine for a long time, you will need to have important blood work done. What side effects may I notice from receiving this medicine? Side effects that you should report to your doctor or health care professional as soon as possible: -allergic reactions  like skin rash or hives, swelling of the face, lips, or tongue -changes in vision -dark urine -fever or infection -general ill feeling or flu-like symptoms -light-colored stools -loss of appetite, nausea -redness, blistering, peeling or loosening of the skin, including inside the mouth -right upper belly pain -unusually weak or tired -yellowing of the eyes or skin Side effects that usually do not require medical attention (report to your doctor or health care professional if they continue or are bothersome): -changes in taste -diarrhea -hair loss -muscle or joint pain -stomach gas -stomach upset This list may not describe all possible side effects. Call your doctor for medical advice about side effects. You may report side effects to FDA at 1-800-FDA-1088. Where should I keep my medicine? Keep out of the reach of children. Store at room temperature below 25 degrees C (77 degrees F). Protect from light. Throw away any unused medicine after the expiration date. NOTE: This sheet is a summary. It may not cover all possible information. If you have questions about this medicine, talk to your doctor, pharmacist, or health care provider.  2015, Elsevier/Gold Standard. (2007-12-06 16:28:07)

## 2015-02-26 ENCOUNTER — Encounter (HOSPITAL_COMMUNITY): Payer: Self-pay

## 2015-02-26 ENCOUNTER — Ambulatory Visit (HOSPITAL_COMMUNITY)
Admission: RE | Admit: 2015-02-26 | Discharge: 2015-02-26 | Disposition: A | Discharge status: Home / Self Care | Payer: Medicaid Other | Source: Ambulatory Visit | Attending: Obstetrics | Admitting: Obstetrics

## 2015-02-26 DIAGNOSIS — Z3169 Encounter for other general counseling and advice on procreation: Secondary | ICD-10-CM | POA: Diagnosis not present

## 2015-02-26 DIAGNOSIS — E1065 Type 1 diabetes mellitus with hyperglycemia: Secondary | ICD-10-CM

## 2015-02-26 DIAGNOSIS — E11319 Type 2 diabetes mellitus with unspecified diabetic retinopathy without macular edema: Secondary | ICD-10-CM | POA: Insufficient documentation

## 2015-02-26 DIAGNOSIS — E108 Type 1 diabetes mellitus with unspecified complications: Secondary | ICD-10-CM

## 2015-02-26 DIAGNOSIS — E10311 Type 1 diabetes mellitus with unspecified diabetic retinopathy with macular edema: Secondary | ICD-10-CM

## 2015-02-26 DIAGNOSIS — E1010 Type 1 diabetes mellitus with ketoacidosis without coma: Secondary | ICD-10-CM

## 2015-02-26 DIAGNOSIS — E109 Type 1 diabetes mellitus without complications: Secondary | ICD-10-CM | POA: Diagnosis not present

## 2015-02-26 DIAGNOSIS — Z794 Long term (current) use of insulin: Secondary | ICD-10-CM | POA: Insufficient documentation

## 2015-02-26 DIAGNOSIS — IMO0002 Reserved for concepts with insufficient information to code with codable children: Secondary | ICD-10-CM

## 2015-02-26 DIAGNOSIS — J45909 Unspecified asthma, uncomplicated: Secondary | ICD-10-CM | POA: Insufficient documentation

## 2015-02-26 DIAGNOSIS — F329 Major depressive disorder, single episode, unspecified: Secondary | ICD-10-CM | POA: Insufficient documentation

## 2015-02-26 DIAGNOSIS — Z9289 Personal history of other medical treatment: Secondary | ICD-10-CM

## 2015-02-26 NOTE — Consult Note (Signed)
Maternal Fetal Medicine Consultation  Requesting Provider(s): Charles A. Jodi Mourning, MD  Reason for consultation: Preconception counseling, type 1 diabetes on insulin pump  HPI: [name] is a 29 yo G2P1001 seen for consultation and preconception counseling due to type 1 diabetes.  [name] reports a 25 year history of type 1 diabetes.  She has known diabetic retinopathy and is legally blind in the right eye.  She otherwise denies any known end-organ disease.  She does have chronic hypertension, but denies any known diabetic nephropathy.  [name] is currently on an insulin pump and is being followed by Dr. Buddy Duty (Endocrinology).  She reports that he is willing to follow her for her pump management during pregnancy and has expressed her desire to become pregnant. She had a recent HbA1C drawn that is currently pending.  Her most recent value prior to that was approximately 9. [name] also reports a history of Asthma - her last exacerbation was 5-6 months ago and a history of Depression with a history of suicidal ideation.  She has been off psychotropic medications for approximately a month - she was previously followed by a mental health professional but has not had any recent follow up.  Ms. Thacker's previous pregnancy was complicated by preeclampsia and delivery at [redacted] weeks gestation via C-section for non-reassuring fetal tracing.  She reports that since that time, she has have chronic hypertension and has been treated with Lisinopril since that time.  OB History: OB History    Gravida Para Term Preterm AB TAB SAB Ectopic Multiple Living   1 1        1       PMH:  Past Medical History  Diagnosis Date  . Asthma   . Tachycardia     baseline tachycardia   . Retinopathy due to secondary diabetes   . Diabetic gastroparesis   . 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 Diabetic neuropathy, type I diabetes mellitus   . Polyneuropathy in  diabetes(357.2)   . Anxiety   . Hypertension   . Depression   . Asthma 11/10/2013  . Headache(784.0)   . Kidney stones     PSH:  Past Surgical History  Procedure Laterality Date  . Cholecystectomy    . Cesarean section    . Refractive surgery     Meds:  Current Outpatient Prescriptions on File Prior to Encounter  Medication Sig Dispense Refill  . albuterol (PROVENTIL HFA;VENTOLIN HFA) 108 (90 BASE) MCG/ACT inhaler Inhale 1-2 puffs into the lungs every 6 (six) hours as needed for wheezing or shortness of breath. 1 Inhaler 0  . albuterol (PROVENTIL) (2.5 MG/3ML) 0.083% nebulizer solution Take 3 mLs (2.5 mg total) by nebulization every 4 (four) hours as needed for wheezing or shortness of breath. 30 vial 1  . folic acid (FOLVITE) 1 MG tablet Take 1 tablet (1 mg total) by mouth daily. 30 tablet 6  . Insulin Human (INSULIN PUMP) SOLN Inject 1 each into the skin 3 times daily with meals, bedtime and 2 AM. 38 units/day basal; bolus sliding scale    . lisinopril (PRINIVIL,ZESTRIL) 10 MG tablet TAKE 1 TABLET BY MOUTH EVERY MORNING 90 tablet 1  . mometasone-formoterol (DULERA) 100-5 MCG/ACT AERO Inhale 2 puffs into the lungs 2 (two) times daily.    . montelukast (SINGULAIR) 10 MG tablet TAKE 1 TABLET BY MOUTH AT BEDTIME 30 tablet 11  . Pren-Fe-Meth-FA-Omeg w/o A (PRENATE ESSENTIAL) 29-0.6-0.4-340 MG CAPS Take 1 tablet  by mouth daily. 30 capsule 12  . brimonidine-timolol (COMBIGAN) 0.2-0.5 % ophthalmic solution Place 1 drop into both eyes every 12 (twelve) hours. For Glaucoma (Patient not taking: Reported on 02/26/2015)    . DULoxetine (CYMBALTA) 30 MG capsule Take 90 mg by mouth daily.    . fluconazole (DIFLUCAN) 150 MG tablet Take 1 tablet (150 mg total) by mouth daily. (Patient not taking: Reported on 02/26/2015) 1 tablet 2  . fluticasone (FLONASE) 50 MCG/ACT nasal spray Place 2 sprays into both nostrils daily as needed for allergies or rhinitis.    . hydrOXYzine (ATARAX/VISTARIL) 50 MG tablet  Take 1 tablet (50 mg total) by mouth 2 (two) times daily as needed for anxiety. (Patient not taking: Reported on 02/12/2015) 45 tablet 0  . ibuprofen (ADVIL,MOTRIN) 200 MG tablet Take 3-4 tablets (600-800 mg total) by mouth every 6 (six) hours as needed for moderate pain. (Patient not taking: Reported on 02/26/2015) 30 tablet 0  . insulin glargine (LANTUS) 100 UNIT/ML injection Inject 38 Units into the skin at bedtime. When NOT on insulin pump    . QUEtiapine (SEROQUEL) 300 MG tablet Take 300 mg by mouth at bedtime.    . terbinafine (LAMISIL) 250 MG tablet Take 1 tablet (250 mg total) by mouth daily. (Patient not taking: Reported on 02/26/2015) 30 tablet 2  . tinidazole (TINDAMAX) 500 MG tablet Take 2 tablets (1,000 mg total) by mouth daily with breakfast. (Patient not taking: Reported on 02/26/2015) 10 tablet 0   No current facility-administered medications on file prior to encounter.   Allergies:  Allergies  Allergen Reactions  . Ceftin Rash    FH:  History   Social History  . Marital Status: Married    Spouse Name: N/A  . Number of Children: N/A  . Years of Education: N/A   Occupational History  . Not on file.   Social History Main Topics  . Smoking status: Never Smoker   . Smokeless tobacco: Never Used  . Alcohol Use: No  . Drug Use: No  . Sexual Activity:    Partners: Male    Patent examiner Protection: IUD, Pill   Other Topics Concern  . Not on file   Social History Narrative   Pt has a daughter and boyfriend.          Soc:  History   Social History  . Marital Status: Married    Spouse Name: N/A  . Number of Children: N/A  . Years of Education: N/A   Occupational History  . Not on file.   Social History Main Topics  . Smoking status: Never Smoker   . Smokeless tobacco: Never Used  . Alcohol Use: No  . Drug Use: No  . Sexual Activity:    Partners: Male    Patent examiner Protection: IUD, Pill   Other Topics Concern  . Not on file   Social History  Narrative   Pt has a daughter and boyfriend.           Review of Systems: no vaginal bleeding or cramping/contractions, no LOF, no nausea/vomiting. All other systems reviewed and are negative.  PE:  Wt: 224 lbs, 134/85, 91  A/P: 1) Type 1 diabetes with known retinopathy, on insulin pump - with known retinopathy, the patient is likely at some risk for vascular disease and utero-placental insufficiency / fetal growth restriction.  We discussed the risks of birth defects and how this is related to the level of her diabetic control at the time of  conception.  With a Hemoglobin A1C of 9, she is at some increased risk compared to baseline and that tighter control would ideally be our preference.  She will continue to follow up with her Endocrinologist to achieve "tighter" control.  After conception, would recommend a detailed ultrasound at 18 weeks and a formal fetal echo at 20-[redacted] weeks gestation.  She will need serial growth ultrasounds every 4-6 weeks throughout pregnancy and antenatal testing beginning no later than [redacted] weeks gestation. I have also recommended that she start prenatal vitamins for folic acid supplementation to decrease the risk of open neural tube defects.  2) Chronic hypertension - would recommend stopping Lisinopril as it is contraindicated in pregnancy.  My preference would be to switch to Labetalol 200 mg BID and titrate upward as appropriate to maintain blood pressures in the 140/90 range.  3) Hx of preeclampsia - given the onset of preeclampsia at 34 weeks, particularly in light of her type 1 diabetes, would recommend baby aspirin - starting at approximately [redacted] weeks gestation.  Given her type 1 diabetes, would also like to assess her renal function.  A 24-hr urine protein and creatine clearance was ordered to determine baseline function.  4) Hx of severe depression / suicidal ideation - the patient is currently off all psychotropic medications and has not had any recent follow up  with Mental Health.  I would strongly encourage her to meet with her counselor prior to pregnancy due to the potential risk of "rebound". Feel that the benefits of many of the anti-depressant medications may far outweigh the risks given her history - but this would be better assessed by a Mental Health care provider.   Thank you for the opportunity to be a part of the care of KATHRINE RIEVES. Please contact our office if we can be of further assistance.   I spent approximately 30 minutes with this patient with over 50% of time spent in face-to-face counseling.  Benjaman Lobe, MD Maternal Fetal Medicine

## 2015-02-26 NOTE — ED Notes (Signed)
Blood pressure 134 85, pulse 91 and weight 224lb.  Pt in for consult with Dr. Lisbeth Renshaw.

## 2015-02-28 ENCOUNTER — Other Ambulatory Visit (HOSPITAL_COMMUNITY)
Admission: AD | Admit: 2015-02-28 | Discharge: 2015-02-28 | Disposition: A | Discharge status: Home / Self Care | Payer: Medicaid Other | Source: Ambulatory Visit | Attending: Family Medicine | Admitting: Family Medicine

## 2015-02-28 DIAGNOSIS — R808 Other proteinuria: Secondary | ICD-10-CM | POA: Insufficient documentation

## 2015-03-01 ENCOUNTER — Ambulatory Visit (HOSPITAL_COMMUNITY)
Admission: RE | Admit: 2015-03-01 | Discharge: 2015-03-01 | Disposition: A | Discharge status: Home / Self Care | Payer: Medicaid Other | Source: Ambulatory Visit | Attending: Obstetrics | Admitting: Obstetrics

## 2015-03-01 DIAGNOSIS — Z3A00 Weeks of gestation of pregnancy not specified: Secondary | ICD-10-CM | POA: Insufficient documentation

## 2015-03-01 DIAGNOSIS — E10319 Type 1 diabetes mellitus with unspecified diabetic retinopathy without macular edema: Secondary | ICD-10-CM | POA: Diagnosis present

## 2015-03-01 DIAGNOSIS — O10919 Unspecified pre-existing hypertension complicating pregnancy, unspecified trimester: Secondary | ICD-10-CM | POA: Diagnosis not present

## 2015-03-01 DIAGNOSIS — O24019 Pre-existing diabetes mellitus, type 1, in pregnancy, unspecified trimester: Secondary | ICD-10-CM | POA: Diagnosis not present

## 2015-03-01 LAB — CREATININE CLEARANCE, URINE, 24 HOUR
CREAT CLEAR: 79 mL/min (ref 75–115)
CREATININE, URINE: 58.82 mg/dL
Collection Interval-CRCL: 24 hours
Creatinine, 24H Ur: 1544 mg/d (ref 600–1800)
URINE TOTAL VOLUME-CRCL: 2625 mL

## 2015-03-01 LAB — COMPREHENSIVE METABOLIC PANEL
ALBUMIN: 3.5 g/dL (ref 3.5–5.0)
ALT: 15 U/L (ref 14–54)
AST: 16 U/L (ref 15–41)
Alkaline Phosphatase: 131 U/L — ABNORMAL HIGH (ref 38–126)
Anion gap: 7 (ref 5–15)
BILIRUBIN TOTAL: 0.3 mg/dL (ref 0.3–1.2)
BUN: 18 mg/dL (ref 6–20)
CHLORIDE: 102 mmol/L (ref 101–111)
CO2: 27 mmol/L (ref 22–32)
CREATININE: 1.36 mg/dL — AB (ref 0.44–1.00)
Calcium: 9.1 mg/dL (ref 8.9–10.3)
GFR calc Af Amer: 60 mL/min — ABNORMAL LOW (ref >60)
GFR calc non Af Amer: 52 mL/min — ABNORMAL LOW (ref >60)
GLUCOSE: 117 mg/dL — AB (ref 65–99)
Potassium: 5.3 mmol/L — ABNORMAL HIGH (ref 3.5–5.1)
Sodium: 136 mmol/L (ref 135–145)
Total Protein: 6.5 g/dL (ref 6.5–8.1)

## 2015-03-01 LAB — PROTEIN, URINE, 24 HOUR
Collection Interval-UPROT: 24 hours
Protein, 24H Urine: 1208 mg/d — ABNORMAL HIGH (ref <150)
Protein, Urine: 46 mg/dL — ABNORMAL HIGH (ref 5–24)
Urine Total Volume-UPROT: 2625 mL

## 2015-03-03 ENCOUNTER — Telehealth: Payer: Self-pay | Admitting: *Deleted

## 2015-03-03 NOTE — Telephone Encounter (Signed)
I left patient messages to call me back in regards to results.  I need to inform her that Dr. Valentina Lucks said labs were normal.  Can start Lamisil.

## 2015-03-04 ENCOUNTER — Telehealth (HOSPITAL_COMMUNITY): Payer: Self-pay | Admitting: *Deleted

## 2015-03-04 NOTE — Telephone Encounter (Signed)
MD reviewed Lab results.  Results faxed to Dr. Jacelyn Grip office.

## 2015-03-12 ENCOUNTER — Ambulatory Visit (INDEPENDENT_AMBULATORY_CARE_PROVIDER_SITE_OTHER): Payer: Medicaid Other

## 2015-03-12 ENCOUNTER — Encounter: Payer: Self-pay | Admitting: Podiatry

## 2015-03-12 ENCOUNTER — Ambulatory Visit (INDEPENDENT_AMBULATORY_CARE_PROVIDER_SITE_OTHER): Payer: Medicaid Other | Admitting: Podiatry

## 2015-03-12 VITALS — BP 140/89 | HR 100 | Resp 12

## 2015-03-12 DIAGNOSIS — L97501 Non-pressure chronic ulcer of other part of unspecified foot limited to breakdown of skin: Secondary | ICD-10-CM

## 2015-03-12 DIAGNOSIS — E1149 Type 2 diabetes mellitus with other diabetic neurological complication: Secondary | ICD-10-CM

## 2015-03-12 DIAGNOSIS — R52 Pain, unspecified: Secondary | ICD-10-CM

## 2015-03-12 DIAGNOSIS — L97511 Non-pressure chronic ulcer of other part of right foot limited to breakdown of skin: Secondary | ICD-10-CM

## 2015-03-12 DIAGNOSIS — E1140 Type 2 diabetes mellitus with diabetic neuropathy, unspecified: Secondary | ICD-10-CM

## 2015-03-18 NOTE — Progress Notes (Signed)
Patient ID: [name], female   DOB: 1986/02/22, 29 y.o.   MRN: [id number]  Subjective: 29 year old female presents the office a follow-up evaluation of ulceration right medial hallux. She states that since last appointment she has been continuing with Silvadene and the wound is pretty much healed at this point. She denies any redness or drainage on the wound. She's been continuing the CAM walker well. She also has concerns of the Lamisil for the nail fungus. She has not yet started taking it. She states that she is trying to get pregnant and is asking if she should be a medication that she is pregnant. No other complaints at this time.  Objective: AAO x 3, NAD DP/PT pulses palpable b/l, CRT < 3 sec Protective sensation decreased with SWMF.  On the medial aspect of the right hallux there is a hyperkeratotic lesion. Upon debridement there is no open lesion at this time and the wound appears to be mostly healed. There is a severe small scab in the central aspect of the area which as well as. There is no swelling erythema, ascending cellulitis, fluctuance, crepitus, drainage, malodor. There is no probing, undermining, tunneling. Right hallux nail is hypertrophic, dystrophic, discolored, brittle. No tenderness around the nail and no surrounding erythema or drainage.  No other open lesions or pre-ulcerative lesions bilaterally.  No overlying edema, erythema, increase in warmth to bilateral lower extremities.  No pain with calf compression, swelling, warmth, erythema.   Assessment: 29 year old female with almost healed right hallux ulcer; onychomycosis  Plan: -X-rays were obtained and reviewed with the patient. No definitve bony exostosis.  -Treatment options discussed including all alternatives, risks, and complications -Pre-ulcerative calluses sharply debrided without complication/bleeding. At this time the wound appears to be mostly healed except for a very small central scab. Continued  antibiotic ointment and a Band-Aid over the area daily until healed. Monitor closely for any clinical signs or symptoms of infection and directed to call the office immediately should any occur go to the ER. -Recommend Lamisil if she is trying to get pregnant. Also the alk phos was slightly elevated (and had been elevated previously). Because of this would hold off. Discussed topical treatment. She will pursue OTC.  -Follow up in 4 weeks to recheck the wound site or sooner should any problems arise. In the meantime, encouraged to call the office with any questions/concerns/change in symptoms.

## 2015-03-19 ENCOUNTER — Other Ambulatory Visit: Payer: Self-pay | Admitting: Family Medicine

## 2015-03-23 ENCOUNTER — Telehealth: Payer: Self-pay | Admitting: Family Medicine

## 2015-03-23 NOTE — Telephone Encounter (Signed)
Patient is in [place] and has MRSA would like to know if something can be called [facility] this [facility] in [place], phone number is [phone] Any questions for patient to [phone]

## 2015-03-23 NOTE — Telephone Encounter (Signed)
Bactrim ds pobid for 7 days.  NTBS if worse.

## 2015-03-24 MED ORDER — SULFAMETHOXAZOLE-TRIMETHOPRIM 800-160 MG PO TABS: 1.0000 | ORAL_TABLET | Freq: Two times a day (BID) | ORAL | Status: DC

## 2015-03-24 NOTE — Telephone Encounter (Signed)
Medication called/sent to requested pharmacy  

## 2015-05-14 ENCOUNTER — Telehealth: Payer: Self-pay | Admitting: *Deleted

## 2015-05-14 NOTE — Telephone Encounter (Signed)
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 Dellis Filbert Kerr,MD office, faxed over to [phone]

## 2015-05-26 ENCOUNTER — Encounter: Payer: Medicaid Other | Attending: Internal Medicine | Admitting: *Deleted

## 2015-05-26 VITALS — Ht 66.0 in | Wt 229.0 lb

## 2015-05-26 DIAGNOSIS — E1069 Type 1 diabetes mellitus with other specified complication: Secondary | ICD-10-CM | POA: Insufficient documentation

## 2015-05-26 DIAGNOSIS — E1065 Type 1 diabetes mellitus with hyperglycemia: Secondary | ICD-10-CM

## 2015-05-26 DIAGNOSIS — E108 Type 1 diabetes mellitus with unspecified complications: Secondary | ICD-10-CM

## 2015-05-26 DIAGNOSIS — Z713 Dietary counseling and surveillance: Secondary | ICD-10-CM | POA: Diagnosis not present

## 2015-05-26 DIAGNOSIS — IMO0002 Reserved for concepts with insufficient information to code with codable children: Secondary | ICD-10-CM

## 2015-05-26 NOTE — Patient Instructions (Addendum)
Plan:  We have studied your Carelink Pump Reports and observed that your Sensitivity Factor is too strong for BG's above 300 so consider subtracting 2 units from Correction Doses above 300 Since you occasionally need a bolus of over 25 units, consider putting in BG first and deliver that bolus before putting in Carb grams so they are delivered separately Consider developing a plan for treatment of low BG's so you don't over treat when possible. Consider having around 30 grams of carbohydrate in the AM vs skipping that meal Consider options for increasing your activity level either dancing, arm chair exercises or playing with your daughter

## 2015-05-30 ENCOUNTER — Encounter: Payer: Self-pay | Admitting: *Deleted

## 2015-05-30 NOTE — Progress Notes (Deleted)
Diabetes Self-Management Education  Visit Type: First/Initial  Appt. Start Time: 1400 Appt. End Time: 5638  05/30/2015  [name], identified by name and date of birth, is a 29 y.o. female with a diagnosis of Diabetes: Type 1.   ASSESSMENT  Height 5\' 6"  (1.676 m), weight 229 lb (103.874 kg). Body mass index is 36.98 kg/(m^2).      Diabetes Self-Management Education - 05/30/15 1326    Patient Education   Medications --  Also suggested she deliver her Correction insulin 1st and add Food insulin afterwards to avoid hitting Max Bolus amount of 25 units at one time.      Individualized Plan for Diabetes Self-Management Training:   Learning Objective:  Patient will have a greater understanding of diabetes self-management. Patient education plan is to attend individual and/or group sessions per assessed needs and concerns.   Plan:   Patient Instructions  Plan:  We have studied your Carelink Pump Reports and observed that your Sensitivity Factor is too strong for BG's above 300 so consider subtracting 2 units from Correction Doses above 300 Since you occasionally need a bolus of over 25 units, consider putting in BG first and deliver that bolus before putting in Carb grams so they are delivered separately Consider developing a plan for treatment of low BG's so you don't over treat when possible. Consider having around 30 grams of carbohydrate in the AM vs skipping that meal Consider options for increasing your activity level either dancing, arm chair exercises or playing with your daughter      Expected Outcomes:  Demonstrated interest in learning. Expect positive outcomes  Education material provided: Living Well with Diabetes and Carbohydrate counting sheet, DM 1 Support Group Flyer, Arm Chair Exercise handout  If problems or questions, patient to contact team via:  Phone and Email  Future DSME appointment: 4-6 wks

## 2015-06-04 NOTE — Progress Notes (Signed)
Diabetes Self-Management Education  Visit Type: First/Initial  Appt. Start Time: 1400 Appt. End Time: 0962  06/04/2015  [name], identified by name and date of birth, is a 29 y.o. female with a diagnosis of Diabetes: Type 1.   ASSESSMENT  Height 5\' 6"  (1.676 m), weight 229 lb (103.874 kg). Body mass index is 36.98 kg/(m^2).              Diabetes Self-Management Education - 05/30/15 1326    Visit Information   Visit Type First/Initial   Initial Visit   Diabetes Type Type 1   Are you taking your medications as prescribed? No   Date Diagnosed age 64   Health Coping   How would you rate your overall health? Fair   Psychosocial Assessment   Patient Belief/Attitude about Diabetes Motivated to manage diabetes   Self-care barriers None   Self-management support Doctor's office   Other persons present Patient   Patient Concerns Nutrition/Meal planning;Weight Control   Special Needs None   Preferred Learning Style No preference indicated   Learning Readiness Contemplating   How often do you need to have someone help you when you read instructions, pamphlets, or other written materials from your doctor or pharmacy? 1 - Never   What is the last grade level you completed in school? associates degree in applied science   Complications   Last HgB A1C per patient/outside source 9.5 %   How often do you check your blood sugar? 3-4 times/day   Fasting Blood glucose range (mg/dL) 130-179   Number of hypoglycemic episodes per month 3   Have you had a dilated eye exam in the past 12 months? Yes   Have you had a dental exam in the past 12 months? Yes   Are you checking your feet? Yes   Dietary Intake   Breakfast [email or web address]   Snack (morning) just for low BG   Lunch sandwich or left overs   Snack (afternoon) [email or web address]   Dinner  [email or web address]   Snack (evening) [email or web address]   Beverage(s) [email or web address]   Exercise   Exercise Type ADL's  likes to swim at beach or near by pool   How many days per week to you exercise? 1   How many minutes per day do you exercise? 30   Total minutes per week of exercise 30   Patient Education   Previous Diabetes Education Yes (please comment)   Disease state  Definition of diabetes, type 1 and 2, and the diagnosis of diabetes   Nutrition management  Carbohydrate counting;Food label reading, portion sizes and measuring food.   Physical activity and exercise  Role of exercise on diabetes management, blood pressure control and cardiac health.;Helped patient identify appropriate exercises in relation to his/her diabetes, diabetes complications and other health issue.   Medications --  Also suggested she deliver her Correction insulin 1st and add Food insulin afterwards to avoid hitting Max Bolus amount of 25 units at one time.   Monitoring Identified appropriate SMBG and/or A1C goals.   Acute complications Taught treatment of hypoglycemia - the 15 rule.;Discussed and identified patients' treatment of hyperglycemia.   Psychosocial adjustment Identified and addressed patients feelings and concerns about diabetes;Role of stress on diabetes;Other (comment)  Informed of DM1 Support Group   Preconception care Pregnancy and GDM  Role of pre-pregnancy blood glucose control on the development of the fetus;Reviewed with patient blood glucose goals with pregnancy;Role of family planning for patients with diabetes   Individualized Goals (developed by  patient)   Nutrition Follow meal plan discussed   Physical Activity Exercise 3-5 times per week   Medications take my medication as prescribed   Monitoring  test blood glucose pre and post meals as discussed   Reducing  Risk examine blood glucose patterns;treat hypoglycemia with 15 grams of carbs if blood glucose less than 70mg /dL   Outcomes   Expected Outcomes Demonstrated interest in learning. Expect positive outcomes   Future DMSE 4-6 wks   Program Status Not Completed      Individualized Plan for Diabetes Self-Management Training:   Learning Objective:  Patient will have a greater understanding of diabetes self-management. Patient education plan is to attend individual and/or group sessions per assessed needs and concerns.   Plan:   Patient Instructions  Plan:  We have studied your Carelink Pump Reports and observed that your Sensitivity Factor is too strong for BG's above 300 so consider subtracting 2 units from Correction Doses above 300 Since you occasionally need a bolus of over 25 units, consider putting in BG first and deliver that bolus before putting in Carb grams so they are delivered separately Consider developing a plan for treatment of low BG's so you don't over treat when possible. Consider having around 30 grams of carbohydrate in the AM vs skipping that meal Consider options for increasing your activity level either dancing, arm chair exercises or playing with your daughter      Expected Outcomes:  Demonstrated interest in learning. Expect positive outcomes  Education material provided: Building surveyor Reports  If problems or questions, patient to contact team via:  Phone and Email  Future DSME appointment: 4-6 wks

## 2015-06-30 ENCOUNTER — Ambulatory Visit: Payer: Self-pay | Admitting: *Deleted

## 2015-08-04 ENCOUNTER — Ambulatory Visit: Payer: Self-pay | Admitting: *Deleted

## 2015-08-25 ENCOUNTER — Ambulatory Visit (INDEPENDENT_AMBULATORY_CARE_PROVIDER_SITE_OTHER): Payer: Medicaid Other | Admitting: Physician Assistant

## 2015-08-25 ENCOUNTER — Encounter: Payer: Self-pay | Admitting: Physician Assistant

## 2015-08-25 VITALS — BP 132/86 | HR 88 | Temp 98.3°F | Resp 18 | Wt 231.0 lb

## 2015-08-25 DIAGNOSIS — H00014 Hordeolum externum left upper eyelid: Secondary | ICD-10-CM | POA: Diagnosis not present

## 2015-08-25 DIAGNOSIS — H00012 Hordeolum externum right lower eyelid: Secondary | ICD-10-CM

## 2015-08-25 DIAGNOSIS — Z23 Encounter for immunization: Secondary | ICD-10-CM

## 2015-08-25 DIAGNOSIS — H00011 Hordeolum externum right upper eyelid: Secondary | ICD-10-CM

## 2015-08-25 MED ORDER — SULFAMETHOXAZOLE-TRIMETHOPRIM 800-160 MG PO TABS: 1.0000 | ORAL_TABLET | Freq: Two times a day (BID) | ORAL | Status: DC

## 2015-08-25 NOTE — Progress Notes (Signed)
Patient ID: [name] MRN: [id number], DOB: November 08, 1985, 30 y.o. Date of Encounter: 08/25/2015, 5:17 PM    Chief Complaint:  Chief Complaint  Patient presents with  . stye on both eyes     HPI: 29 y.o. year old white female says that she called her doctor at [facility] that she sees for her retinopathy. They told her to apply warm compresses and if no better then would need antibiotics. Says that initially she had the stye on the right eye week before last. Since then she has developed another stye on the right eye and now also has a stye on the left eye. She says that in the past this has been treated by her ophthalmologist at [facility] and they have always treated with oral antibiotic Bactrim. She also states that she has had skin infections treated here and culture positive for MRSA. Says that those skin infections have been on her shoulder region and face. She says that she wonders if these styes are MRSA because she says that she gets them frequently. Again, says that has always had styes treated with Bactrim orally.     Home Meds:   Outpatient Prescriptions Prior to Visit  Medication Sig Dispense Refill  . albuterol (PROVENTIL) (2.5 MG/3ML) 0.083% nebulizer solution Take 3 mLs (2.5 mg total) by nebulization every 4 (four) hours as needed for wheezing or shortness of breath. 30 vial 1  . brimonidine-timolol (COMBIGAN) 0.2-0.5 % ophthalmic solution Place 1 drop into both eyes every 12 (twelve) hours. For Glaucoma    . DULoxetine (CYMBALTA) 30 MG capsule Take 90 mg by mouth daily.    . fluticasone (FLONASE) 50 MCG/ACT nasal spray Place 2 sprays into both nostrils daily as needed for allergies or rhinitis.    Marland Kitchen insulin glargine (LANTUS) 100 UNIT/ML injection Inject 38 Units into the skin at bedtime. When NOT on insulin pump    . Insulin Human (INSULIN PUMP) SOLN Inject 1 each into the skin 3 times daily with meals, bedtime and 2 AM. 38 units/day basal; bolus sliding scale    .  mometasone-formoterol (DULERA) 100-5 MCG/ACT AERO Inhale 2 puffs into the lungs 2 (two) times daily.    . montelukast (SINGULAIR) 10 MG tablet TAKE 1 TABLET BY MOUTH AT BEDTIME 30 tablet 11  . Pren-Fe-Meth-FA-Omeg w/o A (PRENATE ESSENTIAL) 29-0.6-0.4-340 MG CAPS Take 1 tablet by mouth daily. 30 capsule 12  . PROAIR HFA 108 (90 BASE) MCG/ACT inhaler INHALE 2 PUFFS INTO LUNGS EVERY 6 HOURS AS NEEDED FOR SHORTNESS OF BREATH 8.5 Inhaler 3  . QUEtiapine (SEROQUEL) 300 MG tablet Take 300 mg by mouth at bedtime.    . folic acid (FOLVITE) 1 MG tablet Take 1 tablet (1 mg total) by mouth daily. (Patient not taking: Reported on 08/25/2015) 30 tablet 6  . hydrOXYzine (ATARAX/VISTARIL) 50 MG tablet Take 1 tablet (50 mg total) by mouth 2 (two) times daily as needed for anxiety. (Patient not taking: Reported on 02/12/2015) 45 tablet 0  . ibuprofen (ADVIL,MOTRIN) 200 MG tablet Take 3-4 tablets (600-800 mg total) by mouth every 6 (six) hours as needed for moderate pain. (Patient not taking: Reported on 02/26/2015) 30 tablet 0  . lisinopril (PRINIVIL,ZESTRIL) 10 MG tablet TAKE 1 TABLET BY MOUTH EVERY MORNING (Patient not taking: Reported on 08/25/2015) 90 tablet 1  . tinidazole (TINDAMAX) 500 MG tablet Take 2 tablets (1,000 mg total) by mouth daily with breakfast. (Patient not taking: Reported on 02/26/2015) 10 tablet 0  . fluconazole (DIFLUCAN)  150 MG tablet Take 1 tablet (150 mg total) by mouth daily. (Patient not taking: Reported on 02/26/2015) 1 tablet 2  . sulfamethoxazole-trimethoprim (BACTRIM DS,SEPTRA DS) 800-160 MG per tablet Take 1 tablet by mouth 2 (two) times daily. 14 tablet 0  . terbinafine (LAMISIL) 250 MG tablet Take 1 tablet (250 mg total) by mouth daily. (Patient not taking: Reported on 02/26/2015) 30 tablet 2   No facility-administered medications prior to visit.    Allergies:  Allergies  Allergen Reactions  . Ceftin Rash      Review of Systems: See HPI for pertinent ROS. All other ROS negative.      Physical Exam: Blood pressure 132/86, pulse 88, temperature 98.3 F (36.8 C), temperature source Oral, resp. rate 18, weight 231 lb (104.781 kg)., Body mass index is 37.3 kg/(m^2). General:  Overweight white female . Appears in no acute distress. HEENT:  Right Eye: There is tiny pink papule along outer edge of upper eyelid. There is a tiny pink papule along outer edge of lower eyelid Left Eye: There is tiny pink papule along outer edge of upper eyelid.  Conjunctiva of both eyes appears normal. Neck: Supple. No thyromegaly. No lymphadenopathy. Lungs: Clear bilaterally to auscultation without wheezes, rales, or rhonchi. Breathing is unlabored. Heart: Regular rhythm. No murmurs, rubs, or gallops. Msk:  Strength and tone normal for age. Extremities/Skin: Warm and dry. No abscesses on skin. No rashes.  Neuro: Alert and oriented X 3. Moves all extremities spontaneously. Gait is normal. CNII-XII grossly in tact. Psych:  Responds to questions appropriately with a normal affect.     ASSESSMENT AND PLAN:  29 y.o. year old female with  1. Hordeolum externum of left upper eyelid - sulfamethoxazole-trimethoprim (BACTRIM DS,SEPTRA DS) 800-160 MG tablet; Take 1 tablet by mouth 2 (two) times daily.  Dispense: 14 tablet; Refill: 0  2. Hordeolum externum of right upper eyelid - sulfamethoxazole-trimethoprim (BACTRIM DS,SEPTRA DS) 800-160 MG tablet; Take 1 tablet by mouth 2 (two) times daily.  Dispense: 14 tablet; Refill: 0  3. Hordeolum externum of right lower eyelid - sulfamethoxazole-trimethoprim (BACTRIM DS,SEPTRA DS) 800-160 MG tablet; Take 1 tablet by mouth 2 (two) times daily.  Dispense: 14 tablet; Refill: 0  She is to start Bactrim immediately take as directed and complete all of it. Follow-up if any of these sites worsen or if they do not resolve upon completion of antibiotic.   4. Need for prophylactic vaccination and inoculation against influenza - Flu Vaccine QUAD 36+ mos  IM   Signed, [address] [place], [place], [facility] 08/25/2015 5:17 PM

## 2015-08-26 ENCOUNTER — Other Ambulatory Visit: Payer: Self-pay | Admitting: Family Medicine

## 2015-08-26 ENCOUNTER — Ambulatory Visit: Payer: Medicaid Other | Admitting: Physician Assistant

## 2015-08-26 NOTE — Telephone Encounter (Signed)
Medication refilled per protocol. 

## 2015-08-30 ENCOUNTER — Telehealth: Payer: Self-pay | Admitting: Family Medicine

## 2015-08-30 MED ORDER — VALACYCLOVIR HCL 1 G PO TABS: ORAL_TABLET | ORAL | Status: DC

## 2015-08-30 NOTE — Telephone Encounter (Signed)
[facility] [address]   Medication:she is requesting something for a fever blister  Patient's phone number:[phone] Caller :[name] she states she was here a week or so ago and saw Olean Ree she states she has had 2 fever blisters on her lip and she would like something called in.

## 2015-08-30 NOTE — Telephone Encounter (Signed)
Valtrex 1 gram Take 2 every 12 hours for 2 doses each time has a cold sore Dispense #30+3 refills

## 2015-08-30 NOTE — Telephone Encounter (Signed)
Left pt message about Rx

## 2015-09-19 ENCOUNTER — Encounter (HOSPITAL_COMMUNITY): Payer: Self-pay | Admitting: *Deleted

## 2015-09-19 ENCOUNTER — Emergency Department (HOSPITAL_COMMUNITY): Payer: Medicaid Other

## 2015-09-19 ENCOUNTER — Emergency Department (HOSPITAL_COMMUNITY)
Admission: EM | Admit: 2015-09-19 | Discharge: 2015-09-19 | Disposition: A | Discharge status: Home / Self Care | Payer: Medicaid Other | Attending: Emergency Medicine | Admitting: Emergency Medicine

## 2015-09-19 DIAGNOSIS — Z87442 Personal history of urinary calculi: Secondary | ICD-10-CM | POA: Diagnosis not present

## 2015-09-19 DIAGNOSIS — I10 Essential (primary) hypertension: Secondary | ICD-10-CM | POA: Insufficient documentation

## 2015-09-19 DIAGNOSIS — Z79899 Other long term (current) drug therapy: Secondary | ICD-10-CM | POA: Diagnosis not present

## 2015-09-19 DIAGNOSIS — Z794 Long term (current) use of insulin: Secondary | ICD-10-CM | POA: Insufficient documentation

## 2015-09-19 DIAGNOSIS — J45909 Unspecified asthma, uncomplicated: Secondary | ICD-10-CM | POA: Diagnosis not present

## 2015-09-19 DIAGNOSIS — M25551 Pain in right hip: Secondary | ICD-10-CM | POA: Diagnosis not present

## 2015-09-19 DIAGNOSIS — Z791 Long term (current) use of non-steroidal anti-inflammatories (NSAID): Secondary | ICD-10-CM | POA: Insufficient documentation

## 2015-09-19 DIAGNOSIS — E1040 Type 1 diabetes mellitus with diabetic neuropathy, unspecified: Secondary | ICD-10-CM | POA: Diagnosis not present

## 2015-09-19 DIAGNOSIS — F419 Anxiety disorder, unspecified: Secondary | ICD-10-CM | POA: Diagnosis not present

## 2015-09-19 DIAGNOSIS — F329 Major depressive disorder, single episode, unspecified: Secondary | ICD-10-CM | POA: Diagnosis not present

## 2015-09-19 DIAGNOSIS — Z792 Long term (current) use of antibiotics: Secondary | ICD-10-CM | POA: Insufficient documentation

## 2015-09-19 MED ORDER — IBUPROFEN 800 MG PO TABS: 800.0000 mg | ORAL_TABLET | Freq: Three times a day (TID) | ORAL | Status: DC

## 2015-09-19 MED ORDER — KETOROLAC TROMETHAMINE 60 MG/2ML IM SOLN
60.0000 mg | Freq: Once | INTRAMUSCULAR | Status: AC
  Administered 2015-09-19: 60 mg via INTRAMUSCULAR
  Filled 2015-09-19: qty 2

## 2015-09-19 MED ORDER — OXYCODONE-ACETAMINOPHEN 5-325 MG PO TABS: 1.0000 | ORAL_TABLET | ORAL | Status: DC | PRN

## 2015-09-19 NOTE — ED Notes (Signed)
Declined W/C at D/C and was escorted to lobby by RN. 

## 2015-09-19 NOTE — Discharge Instructions (Signed)
You have been seen today for hip pain. Your imaging showed no abnormalities. Follow up with PCP as needed for chronic management of this issue. Return to ED should symptoms worsen. Take ibuprofen on a schedule as prescribed, for the next week, whether you have pain or not. Take the ibuprofen with food to avoid GI upset.

## 2015-09-19 NOTE — ED Notes (Signed)
PT reports she pulled her hip one week ago. Pt states the pain has increased to the point she can not walk. Pt walked to room from triage.

## 2015-09-19 NOTE — ED Provider Notes (Signed)
CSN: [id number]     Arrival date & time 09/19/15  1621 History   First MD Initiated Contact with Patient 09/19/15 1632     Chief Complaint  Patient presents with  . Hip Pain     (Consider location/radiation/quality/duration/timing/severity/associated sxs/prior Treatment) HPI   [name] is a 29 y.o. female, with a history of Type I DM and Polyneuropathy, presenting to the ED with anterior right hip pain for the last week. Pt states she thinks she pulled a muscle in her hip when she was reaching for something in the car. Pt states the pain has been intermittent and has been somewhat controlled with ibuprofen. Today the pain is worse after pt finished moving her household goods from one location to another. Pt rates the pain 9/10, stabbing in nature, non-radiating. Pt denies numbness, tingling, weakness, or any other pain or complaints.     Past Medical History  Diagnosis Date  . Asthma   . Tachycardia     baseline tachycardia   . 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 Diabetic gastroparesis ([place])   . 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 Diabetic neuropathy, type I diabetes mellitus ([place])   . Polyneuropathy in diabetes(357.2)   . Anxiety   . Hypertension   . Depression   . Asthma 11/10/2013  . Headache(784.0)   . Kidney stones    Past Surgical History  Procedure Laterality Date  . Cholecystectomy    . Cesarean section    . Refractive surgery     Family History  Problem Relation Age of Onset  . Hypertension     Social History  Substance Use Topics  . Smoking status: Never Smoker   . Smokeless tobacco: Never Used  . Alcohol Use: No   OB History    Gravida Para Term Preterm AB TAB SAB Ectopic Multiple Living   1 1        1      Review of Systems  Constitutional: Negative for fever and chills.  Genitourinary: Negative for dysuria, urgency, hematuria, vaginal bleeding, vaginal discharge, difficulty  urinating, vaginal pain and pelvic pain.  Musculoskeletal: Positive for arthralgias (Right hip pain). Negative for back pain.      Allergies  Ceftin  Home Medications   Prior to Admission medications   Medication Sig Start Date End Date Taking? Authorizing Provider  albuterol (PROVENTIL) (2.5 MG/3ML) 0.083% nebulizer solution Take 3 mLs (2.5 mg total) by nebulization every 4 (four) hours as needed for wheezing or shortness of breath. 08/08/14   Pamella Pert, MD  brimonidine-timolol (COMBIGAN) 0.2-0.5 % ophthalmic solution Place 1 drop into both eyes every 12 (twelve) hours. For Glaucoma 06/03/14   Encarnacion Slates, NP  DULoxetine (CYMBALTA) 30 MG capsule Take 90 mg by mouth daily.    Historical Provider, MD  fluticasone (FLONASE) 50 MCG/ACT nasal spray Place 2 sprays into both nostrils daily as needed for allergies or rhinitis.    Historical Provider, MD  ibuprofen (ADVIL,MOTRIN) 800 MG tablet Take 1 tablet (800 mg total) by mouth 3 (three) times daily. 09/19/15   Shawn C Joy, PA-C  insulin glargine (LANTUS) 100 UNIT/ML injection Inject 38 Units into the skin at bedtime. When NOT on insulin pump    Historical Provider, MD  Insulin Human (INSULIN PUMP) SOLN Inject 1 each into the skin 3 times daily with meals, bedtime and 2 AM. 38 units/day basal; bolus sliding scale    Historical Provider,  MD  labetalol (NORMODYNE) 100 MG tablet Take 100 mg by mouth daily.    Historical Provider, MD  mometasone-formoterol (DULERA) 100-5 MCG/ACT AERO Inhale 2 puffs into the lungs 2 (two) times daily.    Historical Provider, MD  montelukast (SINGULAIR) 10 MG tablet TAKE 1 TABLET BY MOUTH AT BEDTIME 02/24/15   [name], MD  oxyCODONE-acetaminophen (PERCOCET/ROXICET) 5-325 MG tablet Take 1-2 tablets by mouth every 4 (four) hours as needed for severe pain. 09/19/15   Shawn C Joy, PA-C  Pren-Fe-Meth-FA-Omeg w/o A (PRENATE ESSENTIAL) 29-0.6-0.4-340 MG CAPS Take 1 tablet by mouth daily. 02/12/15   Rachelle A  Denney, CNM  PROAIR HFA 108 (90 BASE) MCG/ACT inhaler INHALE 2 PUFFS INTO LUNGS EVERY 6 HOURS AS NEEDED FOR SHORTNESS OF BREATH 08/26/15   Orlena Sheldon, PA-C  QUEtiapine (SEROQUEL) 300 MG tablet Take 300 mg by mouth at bedtime.    Historical Provider, MD  sulfamethoxazole-trimethoprim (BACTRIM DS,SEPTRA DS) 800-160 MG tablet Take 1 tablet by mouth 2 (two) times daily. 08/25/15   Orlena Sheldon, PA-C  valACYclovir (VALTREX) 1000 MG tablet Take two tablets by mouth every 12 hours x two doses as needed for outbreak 08/30/15   Orlena Sheldon, PA-C   BP 109/73 mmHg  Pulse 96  Temp(Src) 98.6 F (37 C) (Oral)  Resp 16  Ht 5\' 6"  (1.676 m)  Wt 102.059 kg  BMI 36.33 kg/m2  SpO2 98%  LMP 09/19/2015 Physical Exam  Constitutional: She appears well-developed and well-nourished. No distress.  HENT:  Head: Normocephalic and atraumatic.  Cardiovascular: Normal rate and regular rhythm.   Pulmonary/Chest: Effort normal.  Musculoskeletal:  Active and passive ROM limited by pain in the right hip. ROM intact in left leg and hip and right leg below the hip. Tenderness to anterior hip. Patient was observed walking with no gait disturbance and no assistance.  Neurological: She is alert. She has normal reflexes.  Skin: Skin is warm and dry. She is not diaphoretic.  Nursing note and vitals reviewed.   ED Course  Procedures (including critical care time) Labs Review Labs Reviewed - No data to display  Imaging Review Dg Hip Unilat With Pelvis 2-3 Views Right  09/19/2015  CLINICAL DATA:  Right hip pain, possible injury 1 week ago EXAM: DG HIP (WITH OR WITHOUT PELVIS) 2-3V RIGHT COMPARISON:  None. FINDINGS: Three views of the right hip submitted. No acute fracture or subluxation. Bilateral hip joints are symmetrical in appearance. SI joints are unremarkable. Minimal degenerative changes pubic symphysis. IMPRESSION: No acute fracture or subluxation. Minimal degenerative changes pubic symphysis. Electronically  Signed   By: Lahoma Crocker M.D.   On: 09/19/2015 17:47   I have personally reviewed and evaluated these images and lab results as part of my medical decision-making.   EKG Interpretation None      MDM   Final diagnoses:  Right hip pain    [name] presents with complaint of right hip pain for the past week.  Suspect that this patient may have a muscle strain versus a noninfectious bursitis. Patient's presentation is not suspicious for septic joint, in that patient has no fever, pain out of proportion, bears weight, and has no erythema, swelling, or increased warmth. Patient was counseled on the appropriate use of scheduled ibuprofen, rest, ice, and follow up with PCP. Patient voiced understanding, agreed to the plan, and is comfortable with discharge.    Lorayne Bender, PA-C 09/19/15 1753  Virgel Manifold, MD 09/19/15 2300

## 2015-09-26 ENCOUNTER — Emergency Department (HOSPITAL_COMMUNITY)
Admission: EM | Admit: 2015-09-26 | Discharge: 2015-09-26 | Disposition: A | Discharge status: Home / Self Care | Payer: Medicaid Other | Attending: Emergency Medicine | Admitting: Emergency Medicine

## 2015-09-26 ENCOUNTER — Encounter (HOSPITAL_COMMUNITY): Payer: Self-pay | Admitting: Emergency Medicine

## 2015-09-26 DIAGNOSIS — Z791 Long term (current) use of non-steroidal anti-inflammatories (NSAID): Secondary | ICD-10-CM | POA: Insufficient documentation

## 2015-09-26 DIAGNOSIS — M79632 Pain in left forearm: Secondary | ICD-10-CM | POA: Diagnosis not present

## 2015-09-26 DIAGNOSIS — Z792 Long term (current) use of antibiotics: Secondary | ICD-10-CM | POA: Diagnosis not present

## 2015-09-26 DIAGNOSIS — R319 Hematuria, unspecified: Secondary | ICD-10-CM | POA: Insufficient documentation

## 2015-09-26 DIAGNOSIS — J45909 Unspecified asthma, uncomplicated: Secondary | ICD-10-CM | POA: Insufficient documentation

## 2015-09-26 DIAGNOSIS — F419 Anxiety disorder, unspecified: Secondary | ICD-10-CM | POA: Insufficient documentation

## 2015-09-26 DIAGNOSIS — G8929 Other chronic pain: Secondary | ICD-10-CM | POA: Insufficient documentation

## 2015-09-26 DIAGNOSIS — Z79899 Other long term (current) drug therapy: Secondary | ICD-10-CM | POA: Diagnosis not present

## 2015-09-26 DIAGNOSIS — R42 Dizziness and giddiness: Secondary | ICD-10-CM

## 2015-09-26 DIAGNOSIS — I10 Essential (primary) hypertension: Secondary | ICD-10-CM | POA: Insufficient documentation

## 2015-09-26 DIAGNOSIS — R200 Anesthesia of skin: Secondary | ICD-10-CM | POA: Insufficient documentation

## 2015-09-26 DIAGNOSIS — Z87442 Personal history of urinary calculi: Secondary | ICD-10-CM | POA: Insufficient documentation

## 2015-09-26 DIAGNOSIS — F329 Major depressive disorder, single episode, unspecified: Secondary | ICD-10-CM | POA: Diagnosis not present

## 2015-09-26 DIAGNOSIS — Z7951 Long term (current) use of inhaled steroids: Secondary | ICD-10-CM | POA: Insufficient documentation

## 2015-09-26 DIAGNOSIS — R202 Paresthesia of skin: Secondary | ICD-10-CM | POA: Diagnosis not present

## 2015-09-26 DIAGNOSIS — E109 Type 1 diabetes mellitus without complications: Secondary | ICD-10-CM | POA: Diagnosis not present

## 2015-09-26 DIAGNOSIS — M79609 Pain in unspecified limb: Secondary | ICD-10-CM

## 2015-09-26 DIAGNOSIS — Z794 Long term (current) use of insulin: Secondary | ICD-10-CM | POA: Insufficient documentation

## 2015-09-26 LAB — BASIC METABOLIC PANEL
ANION GAP: 6 (ref 5–15)
BUN: 19 mg/dL (ref 6–20)
CALCIUM: 8.7 mg/dL — AB (ref 8.9–10.3)
CO2: 26 mmol/L (ref 22–32)
Chloride: 103 mmol/L (ref 101–111)
Creatinine, Ser: 1.18 mg/dL — ABNORMAL HIGH (ref 0.44–1.00)
GLUCOSE: 343 mg/dL — AB (ref 65–99)
Potassium: 4.6 mmol/L (ref 3.5–5.1)
Sodium: 135 mmol/L (ref 135–145)

## 2015-09-26 LAB — URINE MICROSCOPIC-ADD ON
Bacteria, UA: NONE SEEN
Squamous Epithelial / LPF: NONE SEEN

## 2015-09-26 LAB — URINALYSIS, ROUTINE W REFLEX MICROSCOPIC
BILIRUBIN URINE: NEGATIVE
Glucose, UA: 250 mg/dL — AB
Ketones, ur: NEGATIVE mg/dL
Leukocytes, UA: NEGATIVE
NITRITE: NEGATIVE
SPECIFIC GRAVITY, URINE: 1.021 (ref 1.005–1.030)
pH: 5.5 (ref 5.0–8.0)

## 2015-09-26 LAB — CBC
HCT: 36.1 % (ref 36.0–46.0)
HEMOGLOBIN: 11.6 g/dL — AB (ref 12.0–15.0)
MCH: 27 pg (ref 26.0–34.0)
MCHC: 32.1 g/dL (ref 30.0–36.0)
MCV: 84.1 fL (ref 78.0–100.0)
Platelets: 373 10*3/uL (ref 150–400)
RBC: 4.29 MIL/uL (ref 3.87–5.11)
RDW: 13.9 % (ref 11.5–15.5)
WBC: 10.8 10*3/uL — ABNORMAL HIGH (ref 4.0–10.5)

## 2015-09-26 LAB — I-STAT BETA HCG BLOOD, ED (MC, WL, AP ONLY)

## 2015-09-26 LAB — CBG MONITORING, ED: GLUCOSE-CAPILLARY: 300 mg/dL — AB (ref 65–99)

## 2015-09-26 NOTE — ED Notes (Signed)
Pt c/o left arm pain and numbness x 3 days. Pt woke up today with dizziness and weakness.

## 2015-09-26 NOTE — ED Notes (Signed)
Pt verbalized understanding of d/c instructionsand follow-up care. No further questions/concerns, VSS, assisted to lobby in wheelchair. 

## 2015-09-26 NOTE — ED Notes (Signed)
Ok for pt to have PO fluids - pt given diet ginger ale.

## 2015-09-26 NOTE — ED Provider Notes (Signed)
CSN: [id number]     Arrival date & time 09/26/15  1001 History   First MD Initiated Contact with Patient 09/26/15 1118     Chief Complaint  Patient presents with  . Arm Pain  . Dizziness    HPI   [name] is an 29 y.o. female with history of Type 1 DM, HTN, polyneuropathy, chronic headaches who presents to ED for evaluation of arm pain, arm tingling, and dizziness.She states that this week she has been busy moving and noticed two days ago her left forearm was sore. She states that over the next couple of days she noticed numbness and tingling in her left hand/fingers. She denies weakness. Denies specific injury or trauma. SHe states she is worried because she stopped heavy lifting while moving one day ago but her arm still hurts.  She reports she has also felt increasingly dizzy over the past few days. She states she will have moments when she feels like the room is spinning around her. These episodes resolve on their own. Denies LOC. Denies fever, chills, n/v/d. Denies falls. Denies headache. Denies chest pain, SOB.  She states that she has had trouble controlling her glucose for "some time" now, with average cbg 200-300. However, she reports sometimes her glucose will drop to the 40s-50s. She states that she had not previously had issues with her NovoLog. States this week in particularly her glucose has been very varied since she has had an irregular schedule 2/2 moving.   Past Medical History  Diagnosis Date  . Asthma   . Tachycardia     baseline tachycardia   . 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 Diabetic gastroparesis ([place])   . 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 Diabetic neuropathy, type I diabetes mellitus ([place])   . Polyneuropathy in diabetes(357.2)   . Anxiety   . Hypertension   . Depression   . Asthma 11/10/2013  . Headache(784.0)   . Kidney stones    Past Surgical History  Procedure Laterality Date  . Cholecystectomy     . Cesarean section    . Refractive surgery     Family History  Problem Relation Age of Onset  . Hypertension     Social History  Substance Use Topics  . Smoking status: Never Smoker   . Smokeless tobacco: Never Used  . Alcohol Use: No   OB History    Gravida Para Term Preterm AB TAB SAB Ectopic Multiple Living   1 1        1      Review of Systems  All other systems reviewed and are negative.     Allergies  Ceftin  Home Medications   Prior to Admission medications   Medication Sig Start Date End Date Taking? Authorizing Provider  albuterol (PROVENTIL) (2.5 MG/3ML) 0.083% nebulizer solution Take 3 mLs (2.5 mg total) by nebulization every 4 (four) hours as needed for wheezing or shortness of breath. 08/08/14   Pamella Pert, MD  brimonidine-timolol (COMBIGAN) 0.2-0.5 % ophthalmic solution Place 1 drop into both eyes every 12 (twelve) hours. For Glaucoma 06/03/14   Encarnacion Slates, NP  DULoxetine (CYMBALTA) 30 MG capsule Take 90 mg by mouth daily.    Historical Provider, MD  fluticasone (FLONASE) 50 MCG/ACT nasal spray Place 2 sprays into both nostrils daily as needed for allergies or rhinitis.    Historical Provider, MD  ibuprofen (ADVIL,MOTRIN) 800 MG tablet Take 1 tablet (800 mg  total) by mouth 3 (three) times daily. 09/19/15   Shawn C Joy, PA-C  insulin glargine (LANTUS) 100 UNIT/ML injection Inject 38 Units into the skin at bedtime. When NOT on insulin pump    Historical Provider, MD  Insulin Human (INSULIN PUMP) SOLN Inject 1 each into the skin 3 times daily with meals, bedtime and 2 AM. 38 units/day basal; bolus sliding scale    Historical Provider, MD  labetalol (NORMODYNE) 100 MG tablet Take 100 mg by mouth daily.    Historical Provider, MD  mometasone-formoterol (DULERA) 100-5 MCG/ACT AERO Inhale 2 puffs into the lungs 2 (two) times daily.    Historical Provider, MD  montelukast (SINGULAIR) 10 MG tablet TAKE 1 TABLET BY MOUTH AT BEDTIME 02/24/15   [name], MD   oxyCODONE-acetaminophen (PERCOCET/ROXICET) 5-325 MG tablet Take 1-2 tablets by mouth every 4 (four) hours as needed for severe pain. 09/19/15   Shawn C Joy, PA-C  Pren-Fe-Meth-FA-Omeg w/o A (PRENATE ESSENTIAL) 29-0.6-0.4-340 MG CAPS Take 1 tablet by mouth daily. 02/12/15   Rachelle A Denney, CNM  PROAIR HFA 108 (90 BASE) MCG/ACT inhaler INHALE 2 PUFFS INTO LUNGS EVERY 6 HOURS AS NEEDED FOR SHORTNESS OF BREATH 08/26/15   Orlena Sheldon, PA-C  QUEtiapine (SEROQUEL) 300 MG tablet Take 300 mg by mouth at bedtime.    Historical Provider, MD  sulfamethoxazole-trimethoprim (BACTRIM DS,SEPTRA DS) 800-160 MG tablet Take 1 tablet by mouth 2 (two) times daily. 08/25/15   Orlena Sheldon, PA-C  valACYclovir (VALTREX) 1000 MG tablet Take two tablets by mouth every 12 hours x two doses as needed for outbreak 08/30/15   Lonie Peak Dixon, PA-C   BP 113/68 mmHg  Pulse 90  Temp(Src) 98 F (36.7 C) (Oral)  Resp 18  Ht 5\' 6"  (1.676 m)  Wt 102.059 kg  BMI 36.33 kg/m2  SpO2 94%  LMP 09/19/2015 Physical Exam  Constitutional: She is oriented to person, place, and time. No distress.  Obese, NAD  HENT:  Right Ear: External ear normal.  Left Ear: External ear normal.  Nose: Nose normal.  Mouth/Throat: Oropharynx is clear and moist. No oropharyngeal exudate.  Eyes: Conjunctivae and EOM are normal. Pupils are equal, round, and reactive to light. Right eye exhibits no nystagmus. Left eye exhibits no nystagmus.  Neck: Normal range of motion. Neck supple.  Cardiovascular: Normal rate, regular rhythm, normal heart sounds and intact distal pulses.   Pulmonary/Chest: Effort normal and breath sounds normal. No respiratory distress. She has no wheezes. She exhibits no tenderness.  Abdominal: Soft. Bowel sounds are normal. She exhibits no distension. There is no tenderness.  Musculoskeletal: She exhibits no edema.  Neurological: She is alert and oriented to person, place, and time. She has normal strength. No cranial nerve deficit  or sensory deficit. Coordination and gait normal.  Skin: Skin is warm and dry. She is not diaphoretic.  Psychiatric: She has a normal mood and affect.  Nursing note and vitals reviewed.   ED Course  Procedures (including critical care time) Labs Review Labs Reviewed  BASIC METABOLIC PANEL - Abnormal; Notable for the following:    Glucose, Bld 343 (*)    Creatinine, Ser 1.18 (*)    Calcium 8.7 (*)    All other components within normal limits  CBC - Abnormal; Notable for the following:    WBC 10.8 (*)    Hemoglobin 11.6 (*)    All other components within normal limits  URINALYSIS, ROUTINE W REFLEX MICROSCOPIC (NOT AT [facility]) - Abnormal;  Notable for the following:    APPearance CLOUDY (*)    Glucose, UA 250 (*)    Hgb urine dipstick MODERATE (*)    Protein, ur >300 (*)    All other components within normal limits  URINE MICROSCOPIC-ADD ON - Abnormal; Notable for the following:    Casts HYALINE CASTS (*)    All other components within normal limits  CBG MONITORING, ED - Abnormal; Notable for the following:    Glucose-Capillary 300 (*)    All other components within normal limits  I-STAT BETA HCG BLOOD, ED ([facility], [facility], [facility] ONLY)    Imaging Review No results found. I have personally reviewed and evaluated these images and lab results as part of my medical decision-making.   EKG Interpretation   Date/Time:  Sunday September 26 2015 10:14:15 EST Ventricular Rate:  91 PR Interval:  142 QRS Duration: 80 QT Interval:  362 QTC Calculation: 445 R Axis:   86 Text Interpretation:  Normal sinus rhythm Normal ECG No significant change  since last tracing Confirmed by FLOYD MD, DANIEL [id number]) on 09/26/2015  11:22:54 AM        MDM   Final diagnoses:  Paresthesia and pain of left extremity  Vertigo  Hematuria    Labs show elevated Cr 1.8 (though this is better than pt's baseline, which is closer to 1.3), glucose 343, hematuria, glucosuria, and proteinuria. EKG unremarkable. Exam  nonfocal with completely intact neuro exam. Regarding arm pain, I suspect she is sore from moving. THere is no gross deformity, strength and sensation intact, and no known injury/trauma warranting imaging. Her numbness and tingling could be secondary to poorly controlled diabetes/neuropathy vs nerve impingement syndromes. I suspect her dizziness is in part due to her such varied glucose levels and episodes of hypoglycemia. Her glucose is 343 in the ED today and she is not currently dizzy. Her neuro exam is intact, steady gait. No headache, no focal deficits. I encouraged her to f/u with pcp and endocrinologist for better glycemic control. Encouraged small meals throughout the day and drinking plenty of water as she also shows signs of dehydration. Also instructed pt to f/u with pcp as her urine shows copious protein, and I am concerned that with her HTN and DM she is at risk of kidney injury. Otherwise VSS, workup unremarkable. Pt is stable for discharge. ER return precautions given.     Anne Ng, PA-C 09/27/15 [place], DO 09/28/15 [phone]

## 2015-09-26 NOTE — Discharge Instructions (Signed)
As we discussed, your workup was unremarkable. You do have some evidence of blood and protein in your urine, which is chronic. Please follow-up with your primary care provider as we discussed as this can indicate possible kidney injury. Please also talk to your primary care provider and/or endocrinologist to optimize your glycemic control since your sugars have been so varied recently. Return to the ER for new or worsening symptoms.

## 2015-09-30 ENCOUNTER — Encounter: Payer: Self-pay | Admitting: Physician Assistant

## 2015-09-30 ENCOUNTER — Ambulatory Visit (INDEPENDENT_AMBULATORY_CARE_PROVIDER_SITE_OTHER): Payer: Medicaid Other | Admitting: Physician Assistant

## 2015-09-30 VITALS — BP 132/74 | HR 78 | Temp 98.4°F | Resp 18 | Ht 66.0 in | Wt 232.0 lb

## 2015-09-30 DIAGNOSIS — H00011 Hordeolum externum right upper eyelid: Secondary | ICD-10-CM | POA: Diagnosis not present

## 2015-09-30 DIAGNOSIS — H00014 Hordeolum externum left upper eyelid: Secondary | ICD-10-CM

## 2015-09-30 DIAGNOSIS — L03111 Cellulitis of right axilla: Secondary | ICD-10-CM | POA: Diagnosis not present

## 2015-09-30 DIAGNOSIS — H00012 Hordeolum externum right lower eyelid: Secondary | ICD-10-CM | POA: Diagnosis not present

## 2015-09-30 MED ORDER — SULFAMETHOXAZOLE-TRIMETHOPRIM 800-160 MG PO TABS: 1.0000 | ORAL_TABLET | Freq: Two times a day (BID) | ORAL | Status: DC

## 2015-09-30 NOTE — Progress Notes (Signed)
Patient ID: [name] MRN: [id number], DOB: 05-Dec-1985, 29 y.o. Date of Encounter: 09/30/2015, 5:01 PM    Chief Complaint:  Chief Complaint  Patient presents with  . R Axilla Bumps    has had MRSA in the past     HPI: 29 y.o. year old white female has history of MRSA. Just developed these small bumps in her right axilla yesterday. Wanted to come in and get antibiotics before they get worse. No other complaints or concerns.     Home Meds:   Outpatient Prescriptions Prior to Visit  Medication Sig Dispense Refill  . albuterol (PROVENTIL) (2.5 MG/3ML) 0.083% nebulizer solution Take 3 mLs (2.5 mg total) by nebulization every 4 (four) hours as needed for wheezing or shortness of breath. 30 vial 1  . brimonidine-timolol (COMBIGAN) 0.2-0.5 % ophthalmic solution Place 1 drop into both eyes every 12 (twelve) hours. For Glaucoma    . DULoxetine (CYMBALTA) 30 MG capsule Take 90 mg by mouth daily.    . fluticasone (FLONASE) 50 MCG/ACT nasal spray Place 2 sprays into both nostrils daily as needed for allergies or rhinitis.    Marland Kitchen ibuprofen (ADVIL,MOTRIN) 800 MG tablet Take 1 tablet (800 mg total) by mouth 3 (three) times daily. 21 tablet 0  . insulin glargine (LANTUS) 100 UNIT/ML injection Inject 38 Units into the skin at bedtime. When NOT on insulin pump    . Insulin Human (INSULIN PUMP) SOLN Inject 1 each into the skin 3 times daily with meals, bedtime and 2 AM. 38 units/day basal; bolus sliding scale    . labetalol (NORMODYNE) 100 MG tablet Take 100 mg by mouth daily.    . mometasone-formoterol (DULERA) 100-5 MCG/ACT AERO Inhale 2 puffs into the lungs 2 (two) times daily.    . montelukast (SINGULAIR) 10 MG tablet TAKE 1 TABLET BY MOUTH AT BEDTIME 30 tablet 11  . oxyCODONE-acetaminophen (PERCOCET/ROXICET) 5-325 MG tablet Take 1-2 tablets by mouth every 4 (four) hours as needed for severe pain. 6 tablet 0  . Pren-Fe-Meth-FA-Omeg w/o A (PRENATE ESSENTIAL) 29-0.6-0.4-340 MG CAPS Take 1  tablet by mouth daily. 30 capsule 12  . PROAIR HFA 108 (90 BASE) MCG/ACT inhaler INHALE 2 PUFFS INTO LUNGS EVERY 6 HOURS AS NEEDED FOR SHORTNESS OF BREATH 18 g 3  . QUEtiapine (SEROQUEL) 300 MG tablet Take 300 mg by mouth at bedtime.    . valACYclovir (VALTREX) 1000 MG tablet Take two tablets by mouth every 12 hours x two doses as needed for outbreak 30 tablet 3  . sulfamethoxazole-trimethoprim (BACTRIM DS,SEPTRA DS) 800-160 MG tablet Take 1 tablet by mouth 2 (two) times daily. (Patient not taking: Reported on 09/26/2015) 14 tablet 0   No facility-administered medications prior to visit.    Allergies:  Allergies  Allergen Reactions  . Ceftin Rash      Review of Systems: See HPI for pertinent ROS. All other ROS negative.    Physical Exam: Blood pressure 132/74, pulse 78, temperature 98.4 F (36.9 C), temperature source Oral, resp. rate 18, height 5\' 6"  (1.676 m), weight 232 lb (105.235 kg), last menstrual period 09/19/2015., Body mass index is 37.46 kg/(m^2). General:  Obese WF. Appears in no acute distress. Neck: Supple. No thyromegaly. No lymphadenopathy. Lungs: Clear bilaterally to auscultation without wheezes, rales, or rhonchi. Breathing is unlabored. Heart: Regular rhythm. No murmurs, rubs, or gallops. Msk:  Strength and tone normal for age. Extremities/Skin: Right Axilla: 5 areas of erythema at hair follicles where she has shaved. Each site palpated.  No area of firmness/no area of fluctuance/no abscess. Neuro: Alert and oriented X 3. Moves all extremities spontaneously. Gait is normal. CNII-XII grossly in tact. Psych:  Responds to questions appropriately with a normal affect.     ASSESSMENT AND PLAN:  29 y.o. year old female with  1. Cellulitis of right axilla She is to start antibiotic, take as directed, and complete all of it. F/U if site worsens or does not resolve at completion of antibiotic. - sulfamethoxazole-trimethoprim (BACTRIM DS,SEPTRA DS) 800-160 MG tablet; Take  1 tablet by mouth 2 (two) times daily.  Dispense: 14 tablet; Refill: 0   Signed, [address] [place], [place], [facility] 09/30/2015 5:01 PM

## 2015-11-08 ENCOUNTER — Ambulatory Visit (INDEPENDENT_AMBULATORY_CARE_PROVIDER_SITE_OTHER): Payer: Medicaid Other | Admitting: Physician Assistant

## 2015-11-08 ENCOUNTER — Encounter: Payer: Self-pay | Admitting: Physician Assistant

## 2015-11-08 VITALS — BP 144/88 | HR 100 | Temp 98.2°F | Resp 18 | Wt 224.0 lb

## 2015-11-08 DIAGNOSIS — K648 Other hemorrhoids: Secondary | ICD-10-CM | POA: Diagnosis not present

## 2015-11-08 DIAGNOSIS — K921 Melena: Secondary | ICD-10-CM

## 2015-11-08 LAB — CBC W/MCH & 3 PART DIFF
HEMATOCRIT: 36.1 % (ref 36.0–46.0)
Hemoglobin: 12.3 g/dL (ref 12.0–15.0)
Lymphocytes Relative: 32 % (ref 12–46)
Lymphs Abs: 1.9 10*3/uL (ref 0.7–4.0)
MCH: 28.1 pg (ref 26.0–34.0)
MCHC: 34.1 g/dL (ref 30.0–36.0)
MCV: 82.6 fL (ref 78.0–100.0)
Neutro Abs: 3.4 10*3/uL (ref 1.7–7.7)
Neutrophils Relative %: 58 % (ref 43–77)
Platelets: 293 10*3/uL (ref 150–400)
RBC: 4.37 MIL/uL (ref 3.87–5.11)
RDW: 14 % (ref 11.5–15.5)
WBC: 5.8 10*3/uL (ref 4.0–10.5)
WBCMIX: 0.6 10*3/uL (ref 0.1–1.8)
WBCMIXPER: 10 % (ref 3–18)

## 2015-11-08 MED ORDER — HYDROCORTISONE ACETATE 25 MG RE SUPP: 25.0000 mg | Freq: Two times a day (BID) | RECTAL | Status: DC

## 2015-11-08 NOTE — Progress Notes (Signed)
Patient ID: [name] MRN: [id number], DOB: [birth year], 30 y.o. Date of Encounter: @DATE @  Chief Complaint:  Chief Complaint  Patient presents with  . rectal bleeding x 5 days    now passing clots, with BM's only, c/o alot of heartburn    HPI: 30 y.o. year old white female  presents with above.   She says that a while back-- one time she had seen a little bit of blood when she had a bowel movement. Thought it may be secondary to constipation so she tried to make sure to avoid constipation/ hard stools and then she did not see any further blood until recently.  She states that this past Wednesday she saw some blood in toilet when had BM.   She states that this past Friday evening which was 11/05/15--- she saw a blood clot in her stool. Then when she wiped she saw blood clots on the toilet paper.  She states that yesterday which was Sunday 11/07/15----when she was on the toilet " it seemed like diarrhea coming out. But it was blood".   Says that she also has been having heartburn symptoms and has been taking a lot of Tums for the past 2 - 3 weeks. Says she has been using Tums and Pepto-Bismol. Has used no other treatment to treat this--- no PPIs, no H2 blockers.  She states that she has taken no NSAIDs recently.  She has seen no maroon colored stools and no black tarry stools.  She says that every time she has had a BM since last Wednesday the toilet water has been pink with blood.  Has had no abdominal pain.   Past Medical History  Diagnosis Date  . Asthma   . Tachycardia     baseline tachycardia   . 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[place])   . Diabetic gastroparesis ([place])   . 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 Diabetic neuropathy, type I diabetes mellitus ([place])   . Polyneuropathy in diabetes(357.2)   . Anxiety   . Hypertension   . Depression   . Asthma 11/10/2013  . Headache(784.0)   . Kidney stones      Home  Meds: Outpatient Prescriptions Prior to Visit  Medication Sig Dispense Refill  . albuterol (PROVENTIL) (2.5 MG/3ML) 0.083% nebulizer solution Take 3 mLs (2.5 mg total) by nebulization every 4 (four) hours as needed for wheezing or shortness of breath. 30 vial 1  . brimonidine-timolol (COMBIGAN) 0.2-0.5 % ophthalmic solution Place 1 drop into both eyes every 12 (twelve) hours. For Glaucoma    . DULoxetine (CYMBALTA) 30 MG capsule Take 90 mg by mouth daily.    . fluticasone (FLONASE) 50 MCG/ACT nasal spray Place 2 sprays into both nostrils daily as needed for allergies or rhinitis.    Marland Kitchen ibuprofen (ADVIL,MOTRIN) 800 MG tablet Take 1 tablet (800 mg total) by mouth 3 (three) times daily. 21 tablet 0  . insulin glargine (LANTUS) 100 UNIT/ML injection Inject 38 Units into the skin at bedtime. When NOT on insulin pump    . Insulin Human (INSULIN PUMP) SOLN Inject 1 each into the skin 3 times daily with meals, bedtime and 2 AM. 38 units/day basal; bolus sliding scale    . labetalol (NORMODYNE) 100 MG tablet Take 100 mg by mouth daily.    . mometasone-formoterol (DULERA) 100-5 MCG/ACT AERO Inhale 2 puffs into the lungs 2 (two) times daily.    . montelukast (SINGULAIR) 10  MG tablet TAKE 1 TABLET BY MOUTH AT BEDTIME 30 tablet 11  . Pren-Fe-Meth-FA-Omeg w/o A (PRENATE ESSENTIAL) 29-0.6-0.4-340 MG CAPS Take 1 tablet by mouth daily. 30 capsule 12  . PROAIR HFA 108 (90 BASE) MCG/ACT inhaler INHALE 2 PUFFS INTO LUNGS EVERY 6 HOURS AS NEEDED FOR SHORTNESS OF BREATH 18 g 3  . QUEtiapine (SEROQUEL) 300 MG tablet Take 300 mg by mouth at bedtime.    . valACYclovir (VALTREX) 1000 MG tablet Take two tablets by mouth every 12 hours x two doses as needed for outbreak 30 tablet 3  . sulfamethoxazole-trimethoprim (BACTRIM DS,SEPTRA DS) 800-160 MG tablet Take 1 tablet by mouth 2 (two) times daily. 14 tablet 0  . oxyCODONE-acetaminophen (PERCOCET/ROXICET) 5-325 MG tablet Take 1-2 tablets by mouth every 4 (four) hours as needed  for severe pain. (Patient not taking: Reported on 11/08/2015) 6 tablet 0   No facility-administered medications prior to visit.    Allergies:  Allergies  Allergen Reactions  . Ceftin Rash    Social History   Social History  . Marital Status: Married    Spouse Name: N/A  . Number of Children: N/A  . Years of Education: N/A   Occupational History  . Not on file.   Social History Main Topics  . Smoking status: Never Smoker   . Smokeless tobacco: Never Used  . Alcohol Use: No  . Drug Use: No  . Sexual Activity:    Partners: Male    Patent examiner Protection: IUD, Pill   Other Topics Concern  . Not on file   Social History Narrative   Pt has a daughter and boyfriend.           Family History  Problem Relation Age of Onset  . Hypertension       Review of Systems:  See HPI for pertinent ROS. All other ROS negative.    Physical Exam: Blood pressure 144/88, pulse 100, temperature 98.2 F (36.8 C), temperature source Oral, resp. rate 18, weight 224 lb (101.606 kg)., Body mass index is 36.17 kg/(m^2). General: Obese WF. Appears in no acute distress. Neck: Supple. No thyromegaly. No lymphadenopathy. Lungs: Clear bilaterally to auscultation without wheezes, rales, or rhonchi. Breathing is unlabored. Heart: RRR with S1 S2. No murmurs, rubs, or gallops. Abdomen: Soft, non-tender, non-distended with normoactive bowel sounds. No hepatomegaly. No rebound/guarding. No obvious abdominal masses. Digital Rectal Exam: Normal. There is no area with increased tenderness/pain. There is no mass with palpation.  Anoscopy: Internal Hemorhoids visualized. No other abnormality visualized. No fissure/fistula. No mass. Musculoskeletal:  Strength and tone normal for age. Extremities/Skin: Warm and dry. Neuro: Alert and oriented X 3. Moves all extremities spontaneously. Gait is normal. CNII-XII grossly in tact. Psych:  Responds to questions appropriately with a normal affect.   Results for  orders placed or performed in visit on 11/08/15  CBC w/MCH & 3 Part Diff  Result Value Ref Range   WBC 5.8 4.0 - 10.5 K/uL   RBC 4.37 3.87 - 5.11 MIL/uL   Hemoglobin 12.3 12.0 - 15.0 g/dL   HCT 36.1 36.0 - 46.0 %   MCV 82.6 78.0 - 100.0 fL   MCH 28.1 26.0 - 34.0 pg   MCHC 34.1 30.0 - 36.0 g/dL   RDW 14.0 11.5 - 15.5 %   Platelets 293 150 - 400 K/uL   Neutrophils Relative % 58 43 - 77 %   Neutro Abs 3.4 1.7 - 7.7 K/uL   Lymphocytes Relative 32 12 - 46 %  Lymphs Abs 1.9 0.7 - 4.0 K/uL   WBC mixed population % 10 3 - 18 %   WBC mixed population 0.6 0.1 - 1.8 K/uL     ASSESSMENT AND PLAN:  30 y.o. year old female with   1. Hematochezia She has had recent CBC at an ER visit 09/26/15-- for comparison baseline. At that time H/H were 11.6/36.1. H/H today 12.3/36.1---stable/improved.  4 no sign of significant blood loss. Will have her treat with Anusol suppository. Will schedule follow-up with GI. - CBC w/MCH & 3 Part Diff - H. pylori breath test - hydrocortisone (ANUSOL-HC) 25 MG suppository; Place 1 suppository (25 mg total) rectally 2 (two) times daily.  Dispense: 12 suppository; Refill: 0 - Ambulatory referral to Gastroenterology  2. Internal hemorrhoids She has had recent CBC and ER visit 09/26/15 for comparison baseline. At that time a chest/H were 11.6/36.1. H/H today 12.3/36.1---stable/improved.  4 no sign of significant blood loss. Will have her treat with Anusol suppository. Will schedule follow-up with GI.  - hydrocortisone (ANUSOL-HC) 25 MG suppository; Place 1 suppository (25 mg total) rectally 2 (two) times daily.  Dispense: 12 suppository; Refill: 0 - Ambulatory referral to Gastroenterology   Signed, [facility] [place], [place], [facility] 11/08/2015 11:35 AM

## 2015-11-09 LAB — H. PYLORI BREATH TEST: H. pylori Breath Test: NOT DETECTED

## 2015-11-10 ENCOUNTER — Telehealth: Payer: Self-pay | Admitting: Internal Medicine

## 2015-11-10 NOTE — Telephone Encounter (Signed)
Spoke with patient and she reports she had BRB with some clots x 7 days. States she had blood on stools. Hx hemorrhoids. States the bleeding has stopped now. She does have constipation. Former Barista. Scheduled with Arta Bruce, PA-C on 11/24/15 at 1:15 PM. She understands to go to ED if has increase in symptoms(feeling weak, maroon/black stools or abdominal pain)

## 2015-11-15 ENCOUNTER — Telehealth: Payer: Self-pay | Admitting: Family Medicine

## 2015-11-15 MED ORDER — DULOXETINE HCL 30 MG PO CPEP: 90.0000 mg | ORAL_CAPSULE | Freq: Every day | ORAL | Status: DC

## 2015-11-15 NOTE — Telephone Encounter (Signed)
Patient states that she now has [employer] and she can't see her psychiatrics who normally refills her DULoxetine (CYMBALTA) 30 MG capsule and she would like to know if Dr. Dennard Schaumann could send in a refill.   Please call her at [phone]

## 2015-11-15 NOTE — Telephone Encounter (Signed)
Ok to refill 

## 2015-11-15 NOTE — Telephone Encounter (Signed)
Refill appropriate and filled per protocol.  Call placed to patient. [place].

## 2015-11-15 NOTE — Telephone Encounter (Signed)
Patient returned call and made aware.

## 2015-11-15 NOTE — Telephone Encounter (Signed)
[place] with refill temporarily until she establishes with a diff psych.

## 2015-11-24 ENCOUNTER — Telehealth: Payer: Self-pay | Admitting: Emergency Medicine

## 2015-11-24 ENCOUNTER — Other Ambulatory Visit (INDEPENDENT_AMBULATORY_CARE_PROVIDER_SITE_OTHER): Payer: Medicaid Other

## 2015-11-24 ENCOUNTER — Encounter: Payer: Self-pay | Admitting: Physician Assistant

## 2015-11-24 ENCOUNTER — Ambulatory Visit (INDEPENDENT_AMBULATORY_CARE_PROVIDER_SITE_OTHER): Payer: Medicaid Other | Admitting: Physician Assistant

## 2015-11-24 VITALS — BP 162/102 | HR 99 | Ht 66.0 in | Wt 229.6 lb

## 2015-11-24 DIAGNOSIS — E119 Type 2 diabetes mellitus without complications: Secondary | ICD-10-CM

## 2015-11-24 DIAGNOSIS — K644 Residual hemorrhoidal skin tags: Secondary | ICD-10-CM

## 2015-11-24 DIAGNOSIS — Z794 Long term (current) use of insulin: Secondary | ICD-10-CM

## 2015-11-24 DIAGNOSIS — K625 Hemorrhage of anus and rectum: Secondary | ICD-10-CM

## 2015-11-24 DIAGNOSIS — IMO0001 Reserved for inherently not codable concepts without codable children: Secondary | ICD-10-CM

## 2015-11-24 DIAGNOSIS — K5900 Constipation, unspecified: Secondary | ICD-10-CM | POA: Diagnosis not present

## 2015-11-24 DIAGNOSIS — K219 Gastro-esophageal reflux disease without esophagitis: Secondary | ICD-10-CM

## 2015-11-24 DIAGNOSIS — K3184 Gastroparesis: Secondary | ICD-10-CM

## 2015-11-24 DIAGNOSIS — K648 Other hemorrhoids: Secondary | ICD-10-CM | POA: Diagnosis not present

## 2015-11-24 DIAGNOSIS — R1310 Dysphagia, unspecified: Secondary | ICD-10-CM

## 2015-11-24 LAB — CBC WITH DIFFERENTIAL/PLATELET
Basophils Absolute: 0.1 10*3/uL (ref 0.0–0.1)
Basophils Relative: 1.1 % (ref 0.0–3.0)
Eosinophils Absolute: 0.6 10*3/uL (ref 0.0–0.7)
Eosinophils Relative: 6.3 % — ABNORMAL HIGH (ref 0.0–5.0)
HCT: 36.3 % (ref 36.0–46.0)
Hemoglobin: 12.2 g/dL (ref 12.0–15.0)
Lymphocytes Relative: 31 % (ref 12.0–46.0)
Lymphs Abs: 2.7 10*3/uL (ref 0.7–4.0)
MCHC: 33.6 g/dL (ref 30.0–36.0)
MCV: 81.5 fl (ref 78.0–100.0)
Monocytes Absolute: 0.5 10*3/uL (ref 0.1–1.0)
Monocytes Relative: 5.8 % (ref 3.0–12.0)
Neutro Abs: 4.9 10*3/uL (ref 1.4–7.7)
Neutrophils Relative %: 55.8 % (ref 43.0–77.0)
Platelets: 305 10*3/uL (ref 150.0–400.0)
RBC: 4.46 Mil/uL (ref 3.87–5.11)
RDW: 13.6 % (ref 11.5–15.5)
WBC: 8.8 10*3/uL (ref 4.0–10.5)

## 2015-11-24 LAB — HEPATIC FUNCTION PANEL
ALT: 13 U/L (ref 0–35)
AST: 12 U/L (ref 0–37)
Albumin: 3.3 g/dL — ABNORMAL LOW (ref 3.5–5.2)
Alkaline Phosphatase: 128 U/L — ABNORMAL HIGH (ref 39–117)
Bilirubin, Direct: 0 mg/dL (ref 0.0–0.3)
Total Bilirubin: 0.2 mg/dL (ref 0.2–1.2)
Total Protein: 6.3 g/dL (ref 6.0–8.3)

## 2015-11-24 LAB — TSH: TSH: 1.27 u[IU]/mL (ref 0.35–4.50)

## 2015-11-24 LAB — HEMOGLOBIN A1C: HEMOGLOBIN A1C: 11.2 % — AB (ref 4.6–6.5)

## 2015-11-24 MED ORDER — PANTOPRAZOLE SODIUM 40 MG PO TBEC: 40.0000 mg | DELAYED_RELEASE_TABLET | ORAL | Status: DC

## 2015-11-24 MED ORDER — METOCLOPRAMIDE HCL 10 MG PO TABS: 10.0000 mg | ORAL_TABLET | Freq: Three times a day (TID) | ORAL | Status: DC

## 2015-11-24 MED ORDER — HYDROCORTISONE 2.5 % RE CREA: 1.0000 "application " | TOPICAL_CREAM | Freq: Two times a day (BID) | RECTAL | Status: DC

## 2015-11-24 MED ORDER — NA SULFATE-K SULFATE-MG SULF 17.5-3.13-1.6 GM/177ML PO SOLN: 1.0000 | ORAL | Status: DC

## 2015-11-24 NOTE — Telephone Encounter (Signed)
11/24/2015   RE: [name] DOB: 07/09/1986 MRN: [id number]   Dear Dr. Jenna Luo,    We have scheduled the above patient for an endoscopic procedure. Our records show that she is on anticoagulation therapy.   Please advise as to how long the patient may come off her therapy of insulin pump prior to the procedure (please include instructions for the day before and the day of), which is scheduled for 12/06/15.  Please fax back/ or route the completed form to [facility].   Sincerely,    Tinnie Gens, [place]

## 2015-11-24 NOTE — Patient Instructions (Addendum)
Your physician has requested that you go to the basement for lab work before leaving today.   You have been scheduled for a modified barium swallow on 12/01/15 at 9:30 AM. Please arrive 15 minutes prior to your test for registration. You will go to [facility] (1st Floor) for your appointment. Please refrain from eating or drinking anything 4 hours prior to your test. Should you need to cancel or reschedule your appointment, please contact [phone] [facility]) or [phone] [facility]). _____________________________________________________________________ What will happen during an MBS? You will be taken to an x-ray room and seated comfortably. You will be asked to swallow small amounts of food and liquid mixed with barium. Barium is a liquid or paste that allows images of your mouth, throat and esophagus to be seen on x-ray. The x-ray captures moving images of the food you are swallowing as it travels from your mouth through your throat and into your esophagus. This test helps identify whether food or liquid is entering your lungs (aspiration). The test also shows which part of your mouth or throat lacks strength or coordination to move the food or liquid in the right direction. This test typically takes 30 minutes to 1 hour to complete. _______________________________________________________________________   We have sent the following medications to your pharmacy for you to pick up at your convenience: Pantoprazole 40 mg every morning 30 mins prior to breakfast.  Reglan 10 mg with meals and at bedtime.  Anusol Rectal cream apply rectally twice a day.   Please purchase the following medications over the counter and take as directed: Tucks wipes Benefiber 1 heaping tablespoon in 6 oz of water twice a day  We have given you a handout on a high fiber, low fat diet, and antireflux regimen.   You have been scheduled for an endoscopy and colonoscopy. Please follow the written  instructions given to you at your visit today. Please pick up your prep supplies at the pharmacy within the next 1-3 days. If you use inhalers (even only as needed), please bring them with you on the day of your procedure. Your physician has requested that you go to www.startemmi.com and enter the access code given to you at your visit today. This web site gives a general overview about your procedure. However, you should still follow specific instructions given to you by our office regarding your preparation for the procedure.     _  _   INSULIN (SHORT ACTING) MEDICATION INSTRUCTIONS (Regular, Humulog, Novolog, Apidra, Novolin, Humulin)   The day before your procedure:  Do not take your evening dose   The day of your procedure:  Do not take your morning dose  ______________________________________________________________________                _ _ OTHER NON-INSULIN INJECTABLE MEDICATIONS         (Tanzeum, Trulicity, Byetta, Victoza, Bydureon, SymlinPen)  Hold the am of the procedure   _  _   INSULIN PUMP MEDICATION INSTRUCTIONS We will contact the physician managing your diabetic care for written dosage instructions for the day before your procedure and the day of your procedure.  Once we have received the instructions, we will contact you.

## 2015-11-25 ENCOUNTER — Encounter: Payer: Self-pay | Admitting: Physician Assistant

## 2015-11-25 MED ORDER — POLYETHYLENE GLYCOL 3350 17 GM/SCOOP PO POWD: 1.0000 | ORAL | Status: DC

## 2015-11-25 NOTE — Progress Notes (Signed)
Patient ID: [name], female   DOB: 06-06-1986, 30 y.o.   MRN: [id number]    HPI:  [name] is a 30 y.o.   female  referred by Cristina Frizzle, MD  For evaluation of GERD, dysphagia, and rectal discomfort. She has previously been a patient of Dr. Delfin West and was last seen in 2011. She is a type I diabetic who has had persistent upper abdominal pain. In 2011 she had a CT scan that revealed mild pancreatitis which was felt to be secondary to ACE inhibitor's. She presents today with complaints of frequent heartburn. She states she has heartburn throughout the day and wakes up with regurgitation. She has been trying Pepto-Bismol with little relief. She has tried toms that didn't help at all. She has been experiencing difficulty swallowing solids and has had numerous episodes where she has had to spit her food up because it won't go down. She has not had difficulty swallowing liquids. She reports that she has had a cholecystectomy in the past. She reports that she was diagnosed with gastroparesis 10 or 11 years ago in [place]. She had been on metoclopramide for a period of time with resolution of her symptoms but she has not used in many years.    She also reports that for the past 3-4 year she has been primarily constipated with occasional days of diarrhea. She reports she can go up to a week without a bowel movement and then when she goes she passes hard nugget like stools. She sometimes has to use her finger to help with bowel movements start. She has not had to apply pressure to the perineal area to help a bowel movement  Amount. She has tried [place] lax without relief. She has been experiencing rectal bleeding with blood on the toilet tissue, blood mixed in with the stool. And blood dripping in the commode. She often has a sensation of incomplete evacuation and has had mucus with her stools. She denies a family history of colon cancer, colon polyps, or inflammatory bowel  disease.   Her past medical history significant for kidney stones, asthma, depression and anxiety, hypertension, diabetic neuropathy , retinopathy , tachycardia , diabetes.   Past Medical History  Diagnosis Date  . Asthma   . Tachycardia     baseline tachycardia   . 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[place])   . Diabetic gastroparesis ([place])   . 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 Diabetic neuropathy, type I diabetes mellitus ([place])   . Polyneuropathy in diabetes(357.2)   . Anxiety   . Hypertension   . Depression   . Asthma 11/10/2013  . Headache(784.0)   . Kidney stones     Past Surgical History  Procedure Laterality Date  . Cholecystectomy    . Cesarean section    . Refractive surgery     Family History  Problem Relation Age of Onset  . Hypertension     Social History  Substance Use Topics  . Smoking status: Never Smoker   . Smokeless tobacco: Never Used  . Alcohol Use: No   Current Outpatient Prescriptions  Medication Sig Dispense Refill  . albuterol (PROVENTIL) (2.5 MG/3ML) 0.083% nebulizer solution Take 3 mLs (2.5 mg total) by nebulization every 4 (four) hours as needed for wheezing or shortness of breath. 30 vial 1  . brimonidine-timolol (COMBIGAN) 0.2-0.5 % ophthalmic solution Place 1 drop into both eyes every 12 (twelve) hours. For Glaucoma    .  DULoxetine (CYMBALTA) 30 MG capsule Take 3 capsules (90 mg total) by mouth daily. 90 capsule 0  . fluticasone (FLONASE) 50 MCG/ACT nasal spray Place 2 sprays into both nostrils daily as needed for allergies or rhinitis.    Marland Kitchen ibuprofen (ADVIL,MOTRIN) 800 MG tablet Take 1 tablet (800 mg total) by mouth 3 (three) times daily. 21 tablet 0  . insulin glargine (LANTUS) 100 UNIT/ML injection Inject 38 Units into the skin at bedtime. When NOT on insulin pump    . Insulin Human (INSULIN PUMP) SOLN Inject 1 each into the skin 3 times daily with meals, bedtime and 2 AM. 38 units/day basal;  bolus sliding scale    . labetalol (NORMODYNE) 100 MG tablet Take 100 mg by mouth daily.    . mometasone-formoterol (DULERA) 100-5 MCG/ACT AERO Inhale 2 puffs into the lungs 2 (two) times daily.    . montelukast (SINGULAIR) 10 MG tablet TAKE 1 TABLET BY MOUTH AT BEDTIME 30 tablet 11  . Pren-Fe-Meth-FA-Omeg w/o A (PRENATE ESSENTIAL) 29-0.6-0.4-340 MG CAPS Take 1 tablet by mouth daily. 30 capsule 12  . PROAIR HFA 108 (90 BASE) MCG/ACT inhaler INHALE 2 PUFFS INTO LUNGS EVERY 6 HOURS AS NEEDED FOR SHORTNESS OF BREATH 18 g 3  . hydrocortisone (ANUSOL-HC) 2.5 % rectal cream Place 1 application rectally 2 (two) times daily. X 10 days. 30 g 1  . metoCLOPramide (REGLAN) 10 MG tablet Take 1 tablet (10 mg total) by mouth 4 (four) times daily -  before meals and at bedtime. For 5 days. 20 tablet 0  . Na Sulfate-K Sulfate-Mg Sulf SOLN Take 1 kit by mouth as directed. 354 mL 0  . pantoprazole (PROTONIX) 40 MG tablet Take 1 tablet (40 mg total) by mouth every morning. 30 mins prior to breakfast 30 tablet 6  . polyethylene glycol powder (GLYCOLAX/MIRALAX) powder Take 255 g by mouth as directed. 255 g 0  . valACYclovir (VALTREX) 1000 MG tablet Take two tablets by mouth every 12 hours x two doses as needed for outbreak (Patient not taking: Reported on 11/24/2015) 30 tablet 3   No current facility-administered medications for this visit.   Allergies  Allergen Reactions  . Ceftin Rash     Review of Systems:  per history of present illness otherwise negative.    Physical Exam: BP 162/102 mmHg  Pulse 99  Ht 5' 6" (1.676 m)  Wt 229 lb 9.6 oz (104.146 kg)  BMI 37.08 kg/m2 Constitutional: Pleasant,well-developed, female in no acute distress. HEENT: Normocephalic and atraumatic. Conjunctivae are normal. No scleral icterus. Neck supple.  No JVD Cardiovascular: Normal rate, regular rhythm.  Pulmonary/chest: Effort normal and breath sounds normal. No wheezing, rales or rhonchi. Abdominal: Soft, nondistended,   Mild diffuse upper abdominal tenderness to palpation with no rebound or guarding,. Bowel sounds active throughout. There are no masses palpable. No hepatomegaly.  rectal: small external hemorrhoids. Brown stool , Hemoccult negative. Good sphincter tone. Extremities: no edema Lymphadenopathy: No cervical adenopathy noted. Neurological: Alert and oriented to person place and time. Skin: Skin is warm and dry. No rashes noted. Psychiatric: Normal mood and affect. Behavior is normal.  ASSESSMENT AND PLAN:  #1 GERD. An antireflux regimen has been reviewed at length. She will be given a trial of pantoprazole 40 mg 1 by mouth every morning 30 minutes prior to breakfast.    #2. Dysphagia. This may be in part due to #1. A barium swallow with tablet will be obtained. She will be scheduled for an EGD with possible dilation.The  risks, benefits, and alternatives to endoscopy with possible biopsy and possible dilation were discussed with the patient and they consent to proceed.  The procedure will be scheduled with Dr. Loletha Carrow per patient request.   #3. History gastroparesis. Patient has signed a medical release to obtain a copy of her prior gastric emptying scan from [facility] [place] [facility]. She has been instructed to adhere to small, frequent, low-fat meals. She will be given a trial of Reglan 10 mg 1 by mouth before meals and at bedtime 5 days , #20 with no refill. Patient has been advised of the possible side effects of Reglan and will contact us if she has any difficulty tolerating the medication. A hemoglobin A1c will be obtained today. We will also obtain a hepatic function panel.     #4. Constipation. Patient's been instructed to try to increase fiber and water in her diet. She will try Benefiber 1 heaping tablespoon in 6 ounces of water twice daily. A TSH will be obtained.    #5. Hemorrhoids. Patient has been instructed to avoid constipation as possible. She will use Tucks wipes. She will be given a trial of  Anusol HC cream to apply rectally twice daily for 10 days. Her hemorrhoids are likely the source of her rectal bleeding , but due to her history that she has had blood on the toilet tissue, mixed in with the stool, and dripping in at the commode, she will be scheduled for colonoscopy to evaluate for intraluminal pathology.The risks, benefits, and alternatives to colonoscopy with possible biopsy and possible polypectomy were discussed with the patient and they consent to proceed.  A CBC will be obtained.    Further recommendations will be made pending the findings of the above.      , [name] 11/25/2015, 4:30 PM  CC: Cristina Frizzle, MD

## 2015-11-25 NOTE — Telephone Encounter (Signed)
She should temporarily discontinue insulin pump when she is NPO after midnight and resume insulin pump as soon as she resumes her regular diet.

## 2015-11-25 NOTE — Telephone Encounter (Signed)
Spoke to patient about instructions regarding holding her insulin pump. She insisted we speak to her endocrinologist Dr. Buddy Duty at [place]. Patient also wanted to know the results of her blood work. I informed patient that I will reach out to Dr. Buddy Duty and informed her of results. Patient verbalized understanding.

## 2015-11-30 NOTE — Progress Notes (Signed)
Thank you for sending this case to me. I have reviewed the entire note, and the outlined plan seems appropriate.  

## 2015-12-01 ENCOUNTER — Ambulatory Visit (HOSPITAL_COMMUNITY)
Admission: RE | Admit: 2015-12-01 | Discharge: 2015-12-01 | Disposition: A | Discharge status: Home / Self Care | Payer: Medicaid Other | Source: Ambulatory Visit | Attending: Physician Assistant | Admitting: Physician Assistant

## 2015-12-01 DIAGNOSIS — R1310 Dysphagia, unspecified: Secondary | ICD-10-CM

## 2015-12-01 DIAGNOSIS — K219 Gastro-esophageal reflux disease without esophagitis: Secondary | ICD-10-CM

## 2015-12-02 NOTE — Telephone Encounter (Signed)
Patient reports she saw Dr. Buddy Duty for an appointment this week and he gave her instructions on her insulin pump.

## 2015-12-06 ENCOUNTER — Encounter: Payer: Self-pay | Admitting: Gastroenterology

## 2015-12-06 ENCOUNTER — Ambulatory Visit (AMBULATORY_SURGERY_CENTER): Payer: Medicaid Other | Admitting: Gastroenterology

## 2015-12-06 VITALS — BP 146/85 | HR 75 | Temp 97.3°F | Resp 15 | Ht 66.0 in | Wt 229.0 lb

## 2015-12-06 DIAGNOSIS — K625 Hemorrhage of anus and rectum: Secondary | ICD-10-CM

## 2015-12-06 DIAGNOSIS — E1143 Type 2 diabetes mellitus with diabetic autonomic (poly)neuropathy: Secondary | ICD-10-CM

## 2015-12-06 DIAGNOSIS — K5900 Constipation, unspecified: Secondary | ICD-10-CM | POA: Diagnosis not present

## 2015-12-06 DIAGNOSIS — K3184 Gastroparesis: Secondary | ICD-10-CM | POA: Diagnosis not present

## 2015-12-06 DIAGNOSIS — R1310 Dysphagia, unspecified: Secondary | ICD-10-CM

## 2015-12-06 LAB — GLUCOSE, CAPILLARY
Glucose-Capillary: 188 mg/dL — ABNORMAL HIGH (ref 65–99)
Glucose-Capillary: 171 mg/dL — ABNORMAL HIGH (ref 65–99)

## 2015-12-06 MED ORDER — METOCLOPRAMIDE HCL 5 MG PO TABS: 5.0000 mg | ORAL_TABLET | Freq: Three times a day (TID) | ORAL | Status: DC

## 2015-12-06 MED ORDER — SODIUM CHLORIDE 0.9 % IV SOLN: 500.0000 mL | INTRAVENOUS | Status: DC

## 2015-12-06 NOTE — Op Note (Addendum)
[place]  [address] [place], [id number]   COLONOSCOPY PROCEDURE REPORT  PATIENT: Cristina, West  MR#: [id number] BIRTHDATE: October 31, 1985 , 29  yrs. old GENDER: female ENDOSCOPIST: Wilfrid Lund, MD REFERRED BY: PROCEDURE DATE:  12/06/2015 PROCEDURE:   Colonoscopy, diagnostic  ASA CLASS:   Class III INDICATIONS:anal bleeding and constipation. MEDICATIONS: Monitored anesthesia care and Propofol 100 mg IV  DESCRIPTION OF PROCEDURE:   After the risks benefits and alternatives of the procedure were thoroughly explained, informed consent was obtained.  The digital rectal exam revealed no abnormalities of the rectum.   The LB [id number]  endoscope was introduced through the anus and advanced to the cecum, which was identified by both the appendix and ileocecal valve. No adverse events experienced.   The quality of the prep was excellent. (Suprep was used)  The instrument was then slowly withdrawn as the colon was fully examined. Estimated blood loss is zero unless otherwise noted in this procedure report.      COLON FINDINGS: A normal appearing cecum, ileocecal valve, and appendiceal orifice were identified.  The ascending, transverse, descending, sigmoid colon, and rectum appeared unremarkable. Retroflexed views revealed no abnormalities. The time to cecum = 4.1 Withdrawal time = 6.5   The scope was withdrawn and the procedure completed. COMPLICATIONS: There were no immediate complications.  ENDOSCOPIC IMPRESSION: Normal colonoscopy  constipation appears to be dysmotility related to poor glucose control. benign anal bleeding related to constipation. RECOMMENDATIONS: improved glucose control As needed use of MiraLAX powder eSigned:  Wilfrid Lund, MD 12/06/2015 3:21 PM Revised: 12/06/2015 3:21 PM  cc:

## 2015-12-06 NOTE — Progress Notes (Signed)
A/ox3, pleased with MAC, report to RN 

## 2015-12-06 NOTE — Progress Notes (Signed)
Insulin pump intact on discharge

## 2015-12-06 NOTE — Patient Instructions (Addendum)
Handouts given: Gastroparesis, Low fiber Diet Gastroparesis Gastroparesis, also called delayed gastric emptying, is a condition in which food takes longer than normal to empty from the stomach. The condition is usually long-lasting (chronic). CAUSES This condition may be caused by: 1. An endocrine disorder, such as hypothyroidism or diabetes. Diabetes is the most common cause of this condition. 2. A nervous system disease, such as Parkinson disease or multiple sclerosis. 3. Cancer, infection, or surgery of the stomach or vagus nerve. 4. A connective tissue disorder, such as scleroderma. 5. Certain medicines. In most cases, the cause is not known. RISK FACTORS This condition is more likely to develop in:  People with certain disorders, including endocrine disorders, eating disorders, amyloidosis, and scleroderma.  People with certain diseases, including Parkinson disease or multiple sclerosis.  People with cancer or infection of the stomach or vagus nerve.  People who have had surgery on the stomach or vagus nerve.  People who take certain medicines.  Women. SYMPTOMS Symptoms of this condition include: 1. An early feeling of fullness when eating. 2. Nausea. 3. Weight loss. 4. Vomiting. 5. Heartburn. 6. Abdominal bloating. 7. Inconsistent blood glucose levels. 8. Lack of appetite. 9. Acid from the stomach coming up into the esophagus (gastroesophageal reflux). 10. Spasms of the stomach. Symptoms may come and go. DIAGNOSIS This condition is diagnosed with tests, such as:  Tests that check how long it takes food to move through the stomach and intestines. These tests include:  Upper gastrointestinal (GI) series. In this test, X-rays of the intestines are taken after you drink a liquid. The liquid makes the intestines show up better on the X-rays.  Gastric emptying scintigraphy. In this test, scans are taken after you eat food that contains a small amount of radioactive  material.  Wireless capsule GI monitoring system. This test involves swallowing a capsule that records information about movement through the stomach.  Gastric manometry. This test measures electrical and muscular activity in the stomach. It is done with a thin tube that is passed down the throat and into the stomach.  Endoscopy. This test checks for abnormalities in the lining of the stomach. It is done with a long, thin tube that is passed down the throat and into the stomach.  An ultrasound. This test can help rule out gallbladder disease or pancreatitis as a cause of your symptoms. It uses sound waves to take pictures of the inside of your body. TREATMENT There is no cure for gastroparesis. This condition may be managed with:  Treatment of the underlying condition causing the gastroparesis.  Lifestyle changes, including exercise and dietary changes. Dietary changes can include:  Changes in what and when you eat.  Eating smaller meals more often.  Eating low-fat foods.  Eating low-fiber forms of high-fiber foods, such as cooked vegetables instead of raw vegetables.  Having liquid foods in place of solid foods. Liquid foods are easier to digest.  Medicines. These may be given to control nausea and vomiting and to stimulate stomach muscles.  Getting food through a feeding tube. This may be done in severe cases.  A gastric neurostimulator. This is a device that is inserted into the body with surgery. It helps improve stomach emptying and control nausea and vomiting. HOME CARE INSTRUCTIONS  Follow your health care provider's instructions about exercise and diet.  Take medicines only as directed by your health care provider. SEEK MEDICAL CARE IF:  Your symptoms do not improve with treatment.  You have new symptoms. SEEK IMMEDIATE  MEDICAL CARE IF:  You have severe abdominal pain that does not improve with treatment.  You have nausea that does not go away.  You cannot keep  fluids down.   This information is not intended to replace advice given to you by your health care provider. Make sure you discuss any questions you have with your health care provider.   Document Released: 09/25/2005 Document Revised: 02/09/2015 Document Reviewed: 09/21/2014 Elsevier Interactive Patient Education 2016 [place] HAD AN ENDOSCOPIC PROCEDURE TODAY AT [facility]:   Refer to the procedure report that was given to you for any specific questions about what was found during the examination.  If the procedure report does not answer your questions, please call your gastroenterologist to clarify.  If you requested that your care partner not be given the details of your procedure findings, then the procedure report has been included in a sealed envelope for you to review at your convenience later.  YOU SHOULD EXPECT: Some feelings of bloating in the abdomen. Passage of more gas than usual.  Walking can help get rid of the air that was put into your GI tract during the procedure and reduce the bloating. If you had a lower endoscopy (such as a colonoscopy or flexible sigmoidoscopy) you may notice spotting of blood in your stool or on the toilet paper. If you underwent a bowel prep for your procedure, you may not have a normal bowel movement for a few days.  Please Note:  You might notice some irritation and congestion in your nose or some drainage.  This is from the oxygen used during your procedure.  There is no need for concern and it should clear up in a day or so.  SYMPTOMS TO REPORT IMMEDIATELY:   Following lower endoscopy (colonoscopy or flexible sigmoidoscopy):  Excessive amounts of blood in the stool  Significant tenderness or worsening of abdominal pains  Swelling of the abdomen that is new, acute  Fever of 100F or higher   Following upper endoscopy (EGD)  Vomiting of blood or coffee ground material  New chest pain or pain under the shoulder  blades  Painful or persistently difficult swallowing  New shortness of breath  Fever of 100F or higher  Black, tarry-looking stools  For urgent or emergent issues, a gastroenterologist can be reached at any hour by calling [phone].   DIET: Your first meal following the procedure should be a small meal and then it is ok to progress to your normal diet. Heavy or fried foods are harder to digest and may make you feel nauseous or bloated.  Likewise, meals heavy in dairy and vegetables can increase bloating.  Drink plenty of fluids but you should avoid alcoholic beverages for 24 hours.  ACTIVITY:  You should plan to take it easy for the rest of today and you should NOT DRIVE or use heavy machinery until tomorrow (because of the sedation medicines used during the test).    FOLLOW UP: Our staff will call the number listed on your records the next business day following your procedure to check on you and address any questions or concerns that you may have regarding the information given to you following your procedure. If we do not reach you, we will leave a message.  However, if you are feeling well and you are not experiencing any problems, there is no need to return our call.  We will assume that you have returned to your regular daily activities without  incident.  If any biopsies were taken you will be contacted by phone or by letter within the next 1-3 weeks.  Please call us at [phone] if you have not heard about the biopsies in 3 weeks.    SIGNATURES/CONFIDENTIALITY: You and/or your care partner have signed paperwork which will be entered into your electronic medical record.  These signatures attest to the fact that that the information above on your After Visit Summary has been reviewed and is understood.  Full responsibility of the confidentiality of this discharge information lies with you and/or your care-partner.

## 2015-12-06 NOTE — Op Note (Signed)
[facility]  [address] [place], [address]   ENDOSCOPY PROCEDURE REPORT  PATIENT: [name], [name]  MR#: [id number] BIRTHDATE: 08-25-1986 , 29  yrs. old GENDER: female ENDOSCOPIST: Wilfrid Lund, MD REFERRED BY:  Juluis Pitch, MD PROCEDURE DATE:  12/06/2015 PROCEDURE:  EGD, diagnostic ASA CLASS:     Class III INDICATIONS:  chronic heartburn and regurgitation, long-standing history of poorly controlled diabetes, recent hemoglobin A1c 11.3.  MEDICATIONS: Monitored anesthesia care and Propofol 200 mg IV TOPICAL ANESTHETIC: none  DESCRIPTION OF PROCEDURE: After the risks benefits and alternatives of the procedure were thoroughly explained, informed consent was obtained.  The [id number] endoscope was introduced through the mouth and advanced to the second portion of the duodenum , Without limitations.  The instrument was slowly withdrawn as the mucosa was fully examined.      STOMACH: A moderate amount of retained food debris was seen in the stomach.  Was suctioned away for excellent visualization, no underlying coastal abnormalities.  ESOPHAGUS: The mucosa of the esophagus appeared normal.  DUODENUM: The duodenal mucosa showed no abnormalities.  Retroflexed views revealed no abnormalities.     The scope was then withdrawn from the patient and the procedure completed.  COMPLICATIONS: There were no immediate complications.  ENDOSCOPIC IMPRESSION: 1.   A moderate amount of retained food debris was seen in the stomach.  Was suctioned away for excellent visualization, no underlying coastal abnormalities 2.   The mucosa of the esophagus appeared normal 3.   The duodenal mucosa showed no abnormalities  gastroparesis  RECOMMENDATIONS: 1.  Continue metoclopramide 5 mg 3 times daily before meals, the risk of neurologic side effects was again reviewed. 2.  Gastroparesis diet (low fiber, low-fat)  Improved glucose control is the most important  long-term goal  REPEAT EXAM:  eSigned:  Wilfrid Lund, MD 12/06/2015 3:16 PM    CC:  PATIENT NAME:  [name], [name] MR#: [id number]

## 2015-12-07 ENCOUNTER — Telehealth: Payer: Self-pay

## 2015-12-07 NOTE — Telephone Encounter (Signed)
  Follow up Call-  Call back number 12/06/2015  Post procedure Call Back phone  # [phone]  Permission to leave phone message Yes     Patient questions:  Do you have a fever, pain , or abdominal swelling? No. Pain Score  0 *  Have you tolerated food without any problems? Yes.    Have you been able to return to your normal activities? Yes.    Do you have any questions about your discharge instructions: Diet   No. Medications  No. Follow up visit  No.  Do you have questions or concerns about your Care? No.  Actions: * If pain score is 4 or above: No action needed, pain <4.

## 2015-12-17 ENCOUNTER — Encounter: Payer: Self-pay | Admitting: Physician Assistant

## 2016-01-05 ENCOUNTER — Ambulatory Visit: Payer: Medicaid Other | Admitting: Certified Nurse Midwife

## 2016-01-12 ENCOUNTER — Ambulatory Visit: Payer: Medicaid Other | Admitting: Certified Nurse Midwife

## 2016-01-26 ENCOUNTER — Telehealth: Payer: Self-pay | Admitting: Family Medicine

## 2016-01-26 NOTE — Telephone Encounter (Signed)
Received paperwork from RX helper to help this patient with free medications for [facility] and Albuterol inhaler. We have never rx'd either of those she has been getting them from the ER and was wanting to make sure it was ok to refill those 2 meds for pt assistance?!

## 2016-01-27 NOTE — Telephone Encounter (Signed)
Ok with refills 

## 2016-01-31 NOTE — Telephone Encounter (Signed)
Paperwork mailed 01/28/16

## 2016-02-01 ENCOUNTER — Inpatient Hospital Stay (HOSPITAL_COMMUNITY)
Admission: AD | Admit: 2016-02-01 | Discharge: 2016-02-01 | Disposition: A | Discharge status: Home / Self Care | Payer: Medicaid Other | Source: Ambulatory Visit | Attending: Family Medicine | Admitting: Family Medicine

## 2016-02-01 ENCOUNTER — Telehealth: Payer: Self-pay | Admitting: *Deleted

## 2016-02-01 ENCOUNTER — Encounter (HOSPITAL_COMMUNITY): Payer: Self-pay | Admitting: *Deleted

## 2016-02-01 ENCOUNTER — Ambulatory Visit: Payer: Medicaid Other | Admitting: Family Medicine

## 2016-02-01 DIAGNOSIS — D649 Anemia, unspecified: Secondary | ICD-10-CM | POA: Diagnosis not present

## 2016-02-01 DIAGNOSIS — R000 Tachycardia, unspecified: Secondary | ICD-10-CM | POA: Insufficient documentation

## 2016-02-01 DIAGNOSIS — O99011 Anemia complicating pregnancy, first trimester: Secondary | ICD-10-CM | POA: Diagnosis not present

## 2016-02-01 DIAGNOSIS — R079 Chest pain, unspecified: Secondary | ICD-10-CM

## 2016-02-01 DIAGNOSIS — F3170 Bipolar disorder, currently in remission, most recent episode unspecified: Secondary | ICD-10-CM | POA: Insufficient documentation

## 2016-02-01 DIAGNOSIS — O99411 Diseases of the circulatory system complicating pregnancy, first trimester: Secondary | ICD-10-CM | POA: Diagnosis not present

## 2016-02-01 DIAGNOSIS — Z3A01 Less than 8 weeks gestation of pregnancy: Secondary | ICD-10-CM | POA: Insufficient documentation

## 2016-02-01 DIAGNOSIS — O24011 Pre-existing diabetes mellitus, type 1, in pregnancy, first trimester: Secondary | ICD-10-CM

## 2016-02-01 DIAGNOSIS — I10 Essential (primary) hypertension: Secondary | ICD-10-CM | POA: Insufficient documentation

## 2016-02-01 DIAGNOSIS — R0602 Shortness of breath: Secondary | ICD-10-CM

## 2016-02-01 DIAGNOSIS — Z794 Long term (current) use of insulin: Secondary | ICD-10-CM | POA: Insufficient documentation

## 2016-02-01 DIAGNOSIS — Z87898 Personal history of other specified conditions: Secondary | ICD-10-CM

## 2016-02-01 DIAGNOSIS — E109 Type 1 diabetes mellitus without complications: Secondary | ICD-10-CM | POA: Diagnosis not present

## 2016-02-01 LAB — URINALYSIS, ROUTINE W REFLEX MICROSCOPIC
Bilirubin Urine: NEGATIVE
Glucose, UA: 500 mg/dL — AB
KETONES UR: NEGATIVE mg/dL
LEUKOCYTES UA: NEGATIVE
Nitrite: NEGATIVE
PROTEIN: 100 mg/dL — AB
Specific Gravity, Urine: 1.02 (ref 1.005–1.030)
pH: 5.5 (ref 5.0–8.0)

## 2016-02-01 LAB — URINE MICROSCOPIC-ADD ON

## 2016-02-01 LAB — COMPREHENSIVE METABOLIC PANEL
ALK PHOS: 99 U/L (ref 38–126)
ALT: 19 U/L (ref 14–54)
AST: 19 U/L (ref 15–41)
Albumin: 3 g/dL — ABNORMAL LOW (ref 3.5–5.0)
Anion gap: 6 (ref 5–15)
BUN: 19 mg/dL (ref 6–20)
CALCIUM: 8.5 mg/dL — AB (ref 8.9–10.3)
CO2: 24 mmol/L (ref 22–32)
CREATININE: 1.09 mg/dL — AB (ref 0.44–1.00)
Chloride: 106 mmol/L (ref 101–111)
Glucose, Bld: 202 mg/dL — ABNORMAL HIGH (ref 65–99)
Potassium: 3.8 mmol/L (ref 3.5–5.1)
Sodium: 136 mmol/L (ref 135–145)
Total Bilirubin: 0.2 mg/dL — ABNORMAL LOW (ref 0.3–1.2)
Total Protein: 6.1 g/dL — ABNORMAL LOW (ref 6.5–8.1)

## 2016-02-01 LAB — CBC
HCT: 28.3 % — ABNORMAL LOW (ref 36.0–46.0)
HEMOGLOBIN: 9.5 g/dL — AB (ref 12.0–15.0)
MCH: 27.5 pg (ref 26.0–34.0)
MCHC: 33.6 g/dL (ref 30.0–36.0)
MCV: 82 fL (ref 78.0–100.0)
Platelets: 265 10*3/uL (ref 150–400)
RBC: 3.45 MIL/uL — ABNORMAL LOW (ref 3.87–5.11)
RDW: 14.4 % (ref 11.5–15.5)
WBC: 8.6 10*3/uL (ref 4.0–10.5)

## 2016-02-01 LAB — POCT PREGNANCY, URINE: Preg Test, Ur: POSITIVE — AB

## 2016-02-01 MED ORDER — FERROUS SULFATE 325 (65 FE) MG PO TABS: 325.0000 mg | ORAL_TABLET | Freq: Two times a day (BID) | ORAL | Status: DC

## 2016-02-01 MED ORDER — LABETALOL HCL 100 MG PO TABS
200.0000 mg | ORAL_TABLET | Freq: Once | ORAL | Status: AC
  Administered 2016-02-01: 200 mg via ORAL
  Filled 2016-02-01: qty 2

## 2016-02-01 MED ORDER — DOCUSATE SODIUM 100 MG PO CAPS: 100.0000 mg | ORAL_CAPSULE | Freq: Two times a day (BID) | ORAL | Status: DC | PRN

## 2016-02-01 NOTE — MAU Note (Signed)
Started last night, was having tachycardia.  Was getting short of breath, headaches and chest pain.  Still feeling the tachycardia this morning, feels like she is going to faint.  Called OB in [place], instructed to be seen here.  Called primary here, was told to come to [facility]

## 2016-02-01 NOTE — Discharge Instructions (Signed)
Pregnancy and Anemia Anemia is a condition in which the concentration of red blood cells or hemoglobin in the blood is below normal. Hemoglobin is a substance in red blood cells that carries oxygen to the tissues of the body. Anemia results in not enough oxygen reaching these tissues.  Anemia during pregnancy is common because the fetus uses more iron and folic acid as it is developing. Your body may not produce enough red blood cells because of this. Also, during pregnancy, the liquid part of the blood (plasma) increases by about 50%, and the red blood cells increase by only 25%. This lowers the concentration of the red blood cells and creates a natural anemia-like situation.  CAUSES  The most common cause of anemia during pregnancy is not having enough iron in the body to make red blood cells (iron deficiency anemia). Other causes may include:  Folic acid deficiency.  Vitamin B12 deficiency.  Certain prescription or over-the-counter medicines.  Certain medical conditions or infections that destroy red blood cells.  A low platelet count and bleeding caused by antibodies that go through the placenta to the fetus from the mother's blood. SIGNS AND SYMPTOMS  Mild anemia may not be noticeable. If it becomes severe, symptoms may include:  Tiredness.  Shortness of breath, especially with exercise.  Weakness.  Fainting.  Pale looking skin.  Headaches.  Feeling a fast or irregular heartbeat (palpitations). DIAGNOSIS  The type of anemia is usually diagnosed from your family and medical history and blood tests. TREATMENT  Treatment of anemia during pregnancy depends on the cause of the anemia. Treatment can include:  Supplements of iron, vitamin 123456, or folic acid.  A blood transfusion. This may be needed if blood loss is severe.  Hospitalization. This may be needed if there is significant continual blood loss.  Dietary changes. HOME CARE INSTRUCTIONS   Follow your dietitian's or  health care provider's dietary recommendations.  Increase your vitamin C intake. This will help the stomach absorb more iron.  Eat a diet rich in iron. This would include foods such as:  Liver.  Beef.  Whole grain bread.  Eggs.  Dried fruit.  Take iron and vitamins as directed by your health care provider.  Eat green leafy vegetables. These are a good source of folic acid. SEEK MEDICAL CARE IF:   You have frequent or lasting headaches.  You are looking pale.  You are bruising easily. SEEK IMMEDIATE MEDICAL CARE IF:   You have extreme weakness, shortness of breath, or chest pain.  You become dizzy or have trouble concentrating.  You have heavy vaginal bleeding.  You develop a rash.  You have bloody or black, tarry stools.  You faint.  You vomit up blood.  You vomit repeatedly.  You have abdominal pain.  You have a fever or persistent symptoms for more than 2-3 days.  You have a fever and your symptoms suddenly get worse.  You are dehydrated. MAKE SURE YOU:   Understand these instructions.  Will watch your condition.  Will get help right away if you are not doing well or get worse.   This information is not intended to replace advice given to you by your health care provider. Make sure you discuss any questions you have with your health care provider.   Document Released: 09/22/2000 Document Revised: 07/16/2013 Document Reviewed: 05/07/2013 Elsevier Interactive Patient Education 2016 [employer]   Paroxysmal Supraventricular Tachycardia Paroxysmal supraventricular tachycardia (PSVT) is a type of abnormal heart rhythm. It causes your  heart to beat very quickly and then suddenly stop beating so quickly. A normal heart rate is 60-100 beats per minute. During an episode of PSVT, your heart rate may be 150-250 beats per minute. This can make you feel light-headed and short of breath. An episode of PSVT can be frightening. It is usually not dangerous. The  heart has four chambers. All chambers need to work together for the heart to beat effectively. A normal heartbeat usually starts in the right upper chamber of the heart (atrium) when an area (sinoatrial node) puts out an electrical signal that spreads to the other chambers. People with PSVT may have abnormal electrical pathways, or they may have other areas in the upper chambers that send out electrical signals. The result is a very rapid heartbeat. When your heart beats very quickly, it does not have time to fill completely with blood. When PSVT happens often or it lasts for long periods, it can lead to heart weakness and failure. Most people with PSVT do not have any other heart disease. CAUSES Abnormal electrical activity in the heart causes PSVT. It is not known why some people get PSVT and others do not. RISK FACTORS You may be more likely to have PSVT if:  You are 89-75 years old.  You are a woman. Other factors that may increase your chances of an attack include:  Stress.  Being tired.  Smoking.  Stimulant drugs.  Alcoholic drinks.  Caffeine.  Pregnancy. SIGNS AND SYMPTOMS A mild episode of PSVT may cause no symptoms. If you do have signs and symptoms, they may include:  A pounding heart.  Feeling of skipped heartbeats (palpitations).  Weakness.  Shortness of breath.  Tightness or pain in your chest.  Light-headedness.  Anxiety.  Dizziness.  Sweating.  Nausea.  A fainting spell. DIAGNOSIS Your health care provider may suspect PSVT if you have symptoms that come and go. The health care provider will do a physical exam. If you are having an episode during the exam, the health care provider may be able to diagnose PSVT by listening to your heart and feeling your pulse. Tests may also be done, including:  An electrical study of your heart (electrocardiogram, or ECG).  A test in which you wear a portable ECG monitor all day (Holter monitor) or for several days  (event monitor).  A test that involves taking an image of your heart using sound waves (echocardiogram) to rule out other causes of a fast heart rate. TREATMENT You may not need treatment if episodes of PSVT do not happen often or if they do not cause symptoms. If PSVT episodes do cause symptoms, your health care provider may first suggest trying a self-treatment called vagus nerve stimulation. The vagus nerve extends down from the brain. It regulates certain body functions. Stimulating this nerve can slow down the heart. Your health care provider can teach you ways to do this. You may need to try a few ways to find what works best for you. Options include:  Holding your breath and pushing, as though you are having a bowel movement.  Massaging an area on one side of your neck below your jaw.  Bending forward with your head between your legs.  Bending forward with your head between your legs and coughing.  Massaging your eyeballs with your eyes closed. If vagus nerve stimulation does not work, other treatment options include:  Medicines to prevent an attack.  Being treated in the hospital with medicine or electric shock  to stop an attack (cardioversion). This treatment can include:  Getting medicine through an IV line.  Having a small electric shock delivered to your heart. You will be given medicine to make you sleep through this procedure.  If you have frequent episodes with symptoms, you may need a procedure to get rid of the faulty areas of your heart (radiofrequency ablation) and end the episodes of PSVT. In this procedure:  A long, thin tube (catheter) is passed through one of your veins into your heart.  Energy directed through the catheter eliminates the areas of your heart that are causing abnormal electric stimulation. HOME CARE INSTRUCTIONS  Take medicines only as directed by your health care provider.  Do not use caffeine in any form if caffeine triggers episodes of PSVT.  Otherwise, consume caffeine in moderation. This means no more than a few cups of coffee or the equivalent each day.  Do not drink alcohol if alcohol triggers episodes of PSVT. Otherwise, limit alcohol intake to no more than 1 drink per day for nonpregnant women and 2 drinks per day for men. One drink equals 12 ounces of beer, 5 ounces of wine, or 1 ounces of hard liquor.  Do not use any tobacco products, including cigarettes, chewing tobacco, or electronic cigarettes. If you need help quitting, ask your health care provider.  Try to get at least 7 hours of sleep each night.  Find healthy ways to manage stress.  Perform vagus nerve stimulation as directed by your health care provider.  Maintain a healthy weight.  Get some exercise on most days. Ask your health care provider to suggest some good activities for you. SEEK MEDICAL CARE IF:  You are having episodes of PSVT more often, or they are lasting longer.  Vagus nerve stimulation is no longer helping.  You have new symptoms during an episode. SEEK IMMEDIATE MEDICAL CARE IF:  You have chest pain or trouble breathing.  You have an episode of PSVT that has lasted longer than 20 minutes.  You have passed out from an episode of PSVT. These symptoms may represent a serious problem that is an emergency. Do not wait to see if the symptoms will go away. Get medical help right away. Call your local emergency services (911 in the [place] Do not drive yourself to the hospital.   This information is not intended to replace advice given to you by your health care provider. Make sure you discuss any questions you have with your health care provider.   Document Released: 09/25/2005 Document Revised: 10/16/2014 Document Reviewed: 03/05/2014 Elsevier Interactive Patient Education 2016 [place].  Type 1 or Type 2 Diabetes Mellitus During Pregnancy Diabetes mellitus, often simply referred to as diabetes, is a long-term (chronic) disease. Type  1 diabetes occurs when the islet cells, which are in the pancreas and make the hormone insulin, are destroyed and can no longer make insulin. Type 2 diabetes occurs when the pancreas does not make enough insulin, the cells are less responsive to the insulin that is made (insulin resistance), or both. Insulin is needed to move sugars from food into the tissue cells. The tissue cells use the sugars for energy. The lack of insulin or the lack of normal response to insulin causes excess sugars to build up in the blood instead of going into the tissue cells. As a result, high blood sugar (hyperglycemia) develops.  If blood glucose levels are kept in the normal range both before and during pregnancy, women can have a  healthy pregnancy. If your blood glucose levels are not well controlled, there may be risks to you, your unborn baby, and your labor and delivery. Also, there may be risks to your baby once he or she is born.  RISK FACTORS  You are predisposed to developing type 1 diabetes if someone in your family has diabetes and you are exposed to certain environmental triggers.  You have an increased chance of developing type 2 diabetes if you have a family history of diabetes and also have one or more of the following risk factors:  Being overweight.  Having an inactive lifestyle.  Having a history of consistently eating high-calorie foods. SYMPTOMS  Increased thirst (polydipsia).  Increased urination (polyuria).  Increased urination during the night (nocturia).  Weight loss. This weight loss may be rapid.  Frequent, recurring infections.  Tiredness (fatigue).  Weakness.  Vision changes, such as blurred vision.  Fruity smell to your breath.  Abdominal pain.  Nausea or vomiting. DIAGNOSIS  If you have risk factors for diabetes, you may be screened for undiagnosed type 2 diabetes at your first prenatal visit. If you have previously given birth and you had gestational diabetes, you  should be screened. The screening should be performed 6-12 weeks after the child is born and repeated every 1-3 years after the first test. Diabetes is diagnosed when blood glucose levels are increased. Your blood glucose level may be checked by one or more of the following blood tests:  A fasting blood glucose test. You will not be allowed to eat for at least 8 hours before a blood sample is taken.  A random blood glucose test. Your blood glucose is checked at any time of the day regardless of when you ate.  A hemoglobin A1c blood glucose test. A hemoglobin A1c test provides information about blood glucose control over the previous 3 months.  An oral glucose tolerance test (OGTT). Your blood glucose is measured after you have not eaten (fasted) for 1-3 hours and then after you drink a glucose-containing beverage. An OGTT is usually performed during weeks 24-28 of your pregnancy. TREATMENT   You will need to take diabetes medicine or insulin daily to keep blood glucose levels in the desired range.  You will need to match insulin dosing with exercise and healthy food choices. If you have type 1 or type 2 diabetes, your treatment goal is to maintain the following blood glucose levels during pregnancy:  Before meals (preprandial), at bedtime, and overnight: 60-99 mg/dL.  After meals (postprandial): peak of 100-129 mg/dL.  A1c: less than 7%. HOME CARE INSTRUCTIONS   Have your hemoglobin A1c level checked twice a year.  Perform daily blood glucose monitoring as directed by your health care provider. It is common to perform frequent blood glucose monitoring.  Monitor urine ketones when you are sick and as directed by your health care provider.  Take your diabetes medicine and insulin as directed by your health care provider to maintain your blood glucose level in the desired range.  Never run out of diabetes medicine or insulin. It is needed every day.  Adjust insulin based on your  intake of carbohydrates. Carbohydrates can raise blood glucose levels but need to be included in your diet. Carbohydrates provide vitamins, minerals, and fiber, which are an essential part of a healthy diet. Carbohydrates are found in fruits, vegetables, whole grains, dairy products, legumes, and foods containing added sugars.  Eat healthy foods. Alternate 3 meals with 3 snacks.  Maintain a healthy  weight gain. The usual total expected weight gain varies according to your prepregnancy body mass index (BMI).  Carry a medical alert card or wear medical alert jewelry.  Carry a 15-gram carbohydrate snack with you at all times to treat low blood sugar (hypoglycemia). Some examples of 15-gram carbohydrate snacks include:  Glucose tablets, 3 or 4.  Glucose gel, 15-gram tube.  Raisins, 2 Tbsp (24 grams).  Jelly beans, 6.  Animal crackers, 8.  Fruit juice, regular soda, or low-fat milk, 4 ounces (120 mL).  Gummy treats, 9.  Recognize hypoglycemia. Hypoglycemia during pregnancy occurs with blood glucose levels of 60 mg/dL and below. The risk for hypoglycemia increases when fasting or skipping meals, during or after intense exercise, and during sleep. Hypoglycemia symptoms can include:  Tremors or shakes.  Decreased ability to concentrate.  Sweating.  Increased heart rate.  Headache.  Dry mouth.  Hunger.  Irritability.  Anxiety.  Restless sleep.  Altered speech or coordination.  Confusion.  Treat hypoglycemia promptly. If you are alert and able to safely swallow, follow the 15:15 rule:  Take 15-20 grams of rapid-acting glucose or carbohydrate. Rapid-acting options include glucose gel, glucose tablets, or 4 ounces (120 mL) of fruit juice, regular soda, or low-fat milk.  Check your blood glucose level 15 minutes after taking the glucose.  Take an additional 15-20 grams of glucose if the repeat blood glucose level is still 70 mg/dL or below.  Eat a meal or snack within 1  hour once blood glucose levels return to normal.  Engage in at least 30 minutes of physical activity a day or as directed by your health care provider. Ten minutes of physical activity timed 30 minutes after each meal is encouraged to control postprandial blood glucose levels.  Watch for polyuria (excess urination) and polydipsia (feeling extra thirsty), which are early signs of hyperglycemia. An early awareness of hyperglycemia allows for prompt treatment. Treat hyperglycemia as directed by your health care provider.  Adjust your insulin dosing and food intake, as needed, if you start a new exercise or sport.  Follow your sick-day plan any time you are unable to eat or drink as usual.  Avoid tobacco and alcohol use.  Keep all follow-up visits as directed by your health care provider.  Follow the advice of your health care provider regarding your prenatal and post-delivery (postpartum) appointments, meal planning, exercise, medicines, vitamins, blood tests, other medical tests, and physical activities.  Continue daily skin and foot care. Examine your skin and feet daily for cuts, bruises, redness, nail problems, bleeding, blisters, or sores. A foot exam by a health care provider should be done annually.  Brush your teeth and gums at least twice a day and floss at least once a day. Follow up with your dentist regularly.  Schedule an eye exam during the first trimester of your pregnancy or as directed by your health care provider.  Share your diabetes management plan with your workplace or school.  Stay up-to-date with immunizations.  Learn to manage stress.  Obtain ongoing diabetes education and support as needed.  Your health care provider may recommend that you take one low-dose aspirin (81 mg) each day to help prevent high blood pressure during your pregnancy (preeclampsia or eclampsia). You may be at risk for preeclampsia or eclampsia if:  You had preeclampsia or eclampsia during a  previous pregnancy.  Your baby did not grow as expected during a previous pregnancy.  You experienced preterm birth with a previous pregnancy.  You experienced  a separation of the placenta from the uterus (placental abruption) during a previous pregnancy.  You experienced the loss of your baby during a previous pregnancy.  You are pregnant with more than one baby.  You have other medical conditions, such as high blood pressure or autoimmune disease. SEEK MEDICAL CARE IF:   You are unable to eat food or drink fluids for more than 6 hours.  You have nausea and vomiting for more than 6 hours.  You have a blood glucose level of 200 mg/dL and you have ketones in your urine.  There is a change in mental status.  You develop vision problems.  You have a persistent headache.  You have upper abdominal pain or discomfort.  You have an additional serious sickness.  You have diarrhea for more than 6 hours.  You have been sick or have had a fever for 2 days and are not getting better. SEEK IMMEDIATE MEDICAL CARE IF:  You have difficulty breathing.  You no longer feel your baby moving.  You are bleeding or have discharge from your vagina.  You start having premature contractions or labor. MAKE SURE YOU:  Understand these instructions.  Will watch your condition.  Will get help right away if you are not doing well or get worse.   This information is not intended to replace advice given to you by your health care provider. Make sure you discuss any questions you have with your health care provider.   Document Released: 06/19/2012 Document Revised: 10/16/2014 Document Reviewed: 06/19/2012 Elsevier Interactive Patient Education [employer].

## 2016-02-01 NOTE — Telephone Encounter (Signed)
Noted, agree with ER.

## 2016-02-01 NOTE — MAU Note (Signed)
Last wk on [name], saw gest sac, was empty

## 2016-02-01 NOTE — MAU Note (Addendum)
Pt states that she has a hx of intermittent tachycardia, and lastnight she felt like she became tachycardic, then became short of breath, light headed, diaphoretic, and had chest pain.

## 2016-02-01 NOTE — Telephone Encounter (Signed)
Noted patient on MD schedule to be seen today for lightheadedness. Notes on appointment states that patient is pregnant.   Call placed to patient. States that she is approximately [redacted] weeks pregnant and is being seen by OB in [place].   States that on 01/31/2016, she noted tachycardia, increased SOB, chest pain and dizziness. States that vertigo has continued to 02/01/2016 and she was advised by OB that she needs to be evaluated today.   Advised that with patient multiple chronic issues, and SOB and chest pain, patient should go to ER for evaluation.   MD to be made aware.

## 2016-02-01 NOTE — MAU Provider Note (Signed)
Chief Complaint: Tachycardia   First Provider Initiated Contact with Patient 02/01/16 1341      SUBJECTIVE HPI: [name] is a 30 y.o. G2P1 Class D diabetic at [redacted]w[redacted]d by 6 week [name] who presents to maternity admissions reporting episode of chest pain, tachycardia, and shortness of breath that started last night, continuing, but improved today.  She reports a history of tachycardia and took her metoprolol prescribed for these symptoms last night and went to bed. She was able to sleep but woke up with her symptoms again. She called her OB office in [facility] and was told to be seen in [place] where she lives.  She called her primary care provider in [place] and was told to come to [facility] since she is pregnant.  She also reports abdominal cramping starting 2 days ago that is intermittent, moderate, and unchanged by rest, drinking fluids, or changing positions.  She has a confirmed IUP with GS and YS but no fetal pole on [name] on 4/20 at [facility], with records in [place].  She denies vaginal bleeding, vaginal itching/burning, urinary symptoms, h/a, dizziness, n/v, or fever/chills.     HPI  Past Medical History  Diagnosis Date  . Asthma   . Tachycardia     baseline tachycardia   . 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[place])   . Diabetic gastroparesis ([place])   . 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 Diabetic neuropathy, type I diabetes mellitus ([place])   . Polyneuropathy in diabetes(357.2)   . Anxiety   . Hypertension   . Depression   . Asthma 11/10/2013  . Headache(784.0)   . Kidney stones    Past Surgical History  Procedure Laterality Date  . Cholecystectomy    . Cesarean section    . Refractive surgery     Social History   Social History  . Marital Status: Married    Spouse Name: N/A  . Number of Children: N/A  . Years of Education: N/A   Occupational History  . Not on file.   Social History Main Topics   . Smoking status: Never Smoker   . Smokeless tobacco: Never Used  . Alcohol Use: No  . Drug Use: No  . Sexual Activity:    Partners: Male    Patent examiner Protection: IUD, Pill   Other Topics Concern  . Not on file   Social History Narrative   Pt has a daughter and boyfriend.          No current facility-administered medications on file prior to encounter.   Current Outpatient Prescriptions on File Prior to Encounter  Medication Sig Dispense Refill  . brimonidine-timolol (COMBIGAN) 0.2-0.5 % ophthalmic solution Place 1 drop into both eyes every 12 (twelve) hours. For Glaucoma    . fluticasone (FLONASE) 50 MCG/ACT nasal spray Place 2 sprays into both nostrils daily as needed for allergies or rhinitis.    . Insulin Human (INSULIN PUMP) SOLN Inject 1 each into the skin 3 times daily with meals, bedtime and 2 AM. 38 units/day basal; bolus sliding scale    . labetalol (NORMODYNE) 100 MG tablet Take 100 mg by mouth daily.    . metoCLOPramide (REGLAN) 5 MG tablet Take 1 tablet (5 mg total) by mouth 3 (three) times daily before meals. 90 tablet 1  . mometasone-formoterol (DULERA) 100-5 MCG/ACT AERO Inhale 2 puffs into the lungs 2 (two) times daily.    . montelukast (SINGULAIR) 10 MG  tablet TAKE 1 TABLET BY MOUTH AT BEDTIME 30 tablet 11  . pantoprazole (PROTONIX) 40 MG tablet Take 1 tablet (40 mg total) by mouth every morning. 30 mins prior to breakfast 30 tablet 6  . PROAIR HFA 108 (90 BASE) MCG/ACT inhaler INHALE 2 PUFFS INTO LUNGS EVERY 6 HOURS AS NEEDED FOR SHORTNESS OF BREATH 18 g 3  . albuterol (PROVENTIL) (2.5 MG/3ML) 0.083% nebulizer solution Take 3 mLs (2.5 mg total) by nebulization every 4 (four) hours as needed for wheezing or shortness of breath. (Patient not taking: Reported on 12/06/2015) 30 vial 1  . DULoxetine (CYMBALTA) 30 MG capsule Take 3 capsules (90 mg total) by mouth daily. (Patient not taking: Reported on 02/01/2016) 90 capsule 0  . hydrocortisone (ANUSOL-HC) 2.5 %  rectal cream Place 1 application rectally 2 (two) times daily. X 10 days. (Patient not taking: Reported on 12/06/2015) 30 g 1  . ibuprofen (ADVIL,MOTRIN) 800 MG tablet Take 1 tablet (800 mg total) by mouth 3 (three) times daily. (Patient not taking: Reported on 02/01/2016) 21 tablet 0  . insulin glargine (LANTUS) 100 UNIT/ML injection Inject 38 Units into the skin at bedtime. When NOT on insulin pump    . Pren-Fe-Meth-FA-Omeg w/o A (PRENATE ESSENTIAL) 29-0.6-0.4-340 MG CAPS Take 1 tablet by mouth daily. (Patient not taking: Reported on 12/06/2015) 30 capsule 12  . valACYclovir (VALTREX) 1000 MG tablet Take two tablets by mouth every 12 hours x two doses as needed for outbreak (Patient not taking: Reported on 11/24/2015) 30 tablet 3   Allergies  Allergen Reactions  . Ceftin Rash    ROS:  Review of Systems  Constitutional: Negative for fever, chills and fatigue.  Respiratory: Positive for chest tightness and shortness of breath.   Cardiovascular: Positive for chest pain.  Gastrointestinal: Negative for nausea and vomiting.  Genitourinary: Positive for pelvic pain. Negative for dysuria, flank pain, vaginal bleeding, vaginal discharge, difficulty urinating and vaginal pain.  Neurological: Negative for dizziness, syncope and headaches.  Psychiatric/Behavioral: Negative.      I have reviewed patient's Past Medical Hx, Surgical Hx, Family Hx, Social Hx, medications and allergies.   Physical Exam   Patient Vitals for the past 24 hrs:  BP Temp Temp src Pulse Resp SpO2 Weight  02/01/16 1353 153/74 mmHg - - 98 - 100 % -  02/01/16 1350 138/73 mmHg - - 92 - 100 % -  02/01/16 1348 142/78 mmHg - - 92 - 100 % -  02/01/16 1347 153/74 mmHg - - 91 16 100 % -  02/01/16 1327 156/83 mmHg - - 94 18 100 % -  02/01/16 1252 144/78 mmHg 98.2 F (36.8 C) Oral 93 20 100 % 252 lb (114.306 kg)  02/01/16 1250 - - - - - 100 % -   Constitutional: Well-developed, well-nourished female in no acute distress.  HEART:  normal rate, heart sounds, regular rhythm RESP: normal effort, lung sounds clear and equal bilaterally GI: Abd soft, non-tender. Pos BS x 4 MS: Extremities nontender, no edema, normal ROM Neurologic: Alert and oriented x 4.  GU: Neg CVAT.  PELVIC EXAM: Deferred   LAB RESULTS Results for orders placed or performed during the hospital encounter of 02/01/16 (from the past 24 hour(s))  Urinalysis, Routine w reflex microscopic (not at [facility])     Status: Abnormal   Collection Time: 02/01/16 12:55 PM  Result Value Ref Range   Color, Urine YELLOW YELLOW   APPearance CLEAR CLEAR   Specific Gravity, Urine 1.020 1.005 - 1.030  pH 5.5 5.0 - 8.0   Glucose, UA 500 (A) NEGATIVE mg/dL   Hgb urine dipstick MODERATE (A) NEGATIVE   Bilirubin Urine NEGATIVE NEGATIVE   Ketones, ur NEGATIVE NEGATIVE mg/dL   Protein, ur 100 (A) NEGATIVE mg/dL   Nitrite NEGATIVE NEGATIVE   Leukocytes, UA NEGATIVE NEGATIVE  Urine microscopic-add on     Status: Abnormal   Collection Time: 02/01/16 12:55 PM  Result Value Ref Range   Squamous Epithelial / LPF 0-5 (A) NONE SEEN   WBC, UA 0-5 0 - 5 WBC/hpf   RBC / HPF 0-5 0 - 5 RBC/hpf   Bacteria, UA RARE (A) NONE SEEN  Pregnancy, urine POC     Status: Abnormal   Collection Time: 02/01/16  1:06 PM  Result Value Ref Range   Preg Test, Ur POSITIVE (A) NEGATIVE  CBC     Status: Abnormal   Collection Time: 02/01/16  2:20 PM  Result Value Ref Range   WBC 8.6 4.0 - 10.5 K/uL   RBC 3.45 (L) 3.87 - 5.11 MIL/uL   Hemoglobin 9.5 (L) 12.0 - 15.0 g/dL   HCT 28.3 (L) 36.0 - 46.0 %   MCV 82.0 78.0 - 100.0 fL   MCH 27.5 26.0 - 34.0 pg   MCHC 33.6 30.0 - 36.0 g/dL   RDW 14.4 11.5 - 15.5 %   Platelets 265 150 - 400 K/uL  Comprehensive metabolic panel     Status: Abnormal   Collection Time: 02/01/16  2:20 PM  Result Value Ref Range   Sodium 136 135 - 145 mmol/L   Potassium 3.8 3.5 - 5.1 mmol/L   Chloride 106 101 - 111 mmol/L   CO2 24 22 - 32 mmol/L   Glucose, Bld 202 (H) 65  - 99 mg/dL   BUN 19 6 - 20 mg/dL   Creatinine, Ser 1.09 (H) 0.44 - 1.00 mg/dL   Calcium 8.5 (L) 8.9 - 10.3 mg/dL   Total Protein 6.1 (L) 6.5 - 8.1 g/dL   Albumin 3.0 (L) 3.5 - 5.0 g/dL   AST 19 15 - 41 U/L   ALT 19 14 - 54 U/L   Alkaline Phosphatase 99 38 - 126 U/L   Total Bilirubin 0.2 (L) 0.3 - 1.2 mg/dL   GFR calc non Af Amer >60 >60 mL/min   GFR calc Af Amer >60 >60 mL/min   Anion gap 6 5 - 15       IMAGING No results found.  MAU Management/MDM: Reviewed recent records in [facility] Every[facility] from [facility].  Ordered labs and EKG and reviewed results.  Pt stable and with normal EKG and heart/lung sounds today.  Consult Dr Kennon Rounds.  Plan for outpatient cardiology f/u and event monitoring.  Consult Dr Fransico Him.  Cardiology to call pt to set up monitoring and appointment.  Treatments in MAU included labetalol 200 mg x 1 dose for elevated BP. Lab results indicate anemia of pregnancy so Rx for ferrous sulfate 325 mg BID and Colace 100 mg BID.  Pt to call her OB office today to follow up related to elevated BP in pregnancy/review current medications.  Return to [facility] if cardiac symptoms worsen, to [facility] with OB/Gyn emergencies. Pt stable at time of discharge.  ASSESSMENT 1. History of tachycardia   2. Shortness of breath   3. Chest pain, unspecified chest pain type   4. Type 1 diabetes mellitus affecting pregnancy in first trimester, antepartum   5. Anemia affecting pregnancy in first trimester  PLAN Discharge home Cardiology to call pt and pt to call her OB office for f/u Return to [facility] for cardiac emergencies, MAU for OB/Gyn emergencies    Medication List    STOP taking these medications        DULoxetine 30 MG capsule  Commonly known as:  CYMBALTA     hydrocortisone 2.5 % rectal cream  Commonly known as:  ANUSOL-HC     ibuprofen 800 MG tablet  Commonly known as:  ADVIL,MOTRIN     PRENATE ESSENTIAL 29-0.6-0.4-340 MG Caps     valACYclovir 1000 MG tablet  Commonly known  as:  VALTREX      TAKE these medications        brimonidine-timolol 0.2-0.5 % ophthalmic solution  Commonly known as:  COMBIGAN  Place 1 drop into both eyes every 12 (twelve) hours. For Glaucoma     docusate sodium 100 MG capsule  Commonly known as:  COLACE  Take 1 capsule (100 mg total) by mouth 2 (two) times daily as needed.     ferrous sulfate 325 (65 FE) MG tablet  Commonly known as:  FERROUSUL  Take 1 tablet (325 mg total) by mouth 2 (two) times daily.     fluticasone 50 MCG/ACT nasal spray  Commonly known as:  FLONASE  Place 2 sprays into both nostrils daily as needed for allergies or rhinitis.     gabapentin 600 MG tablet  Commonly known as:  NEURONTIN  Take 600 mg by mouth 3 (three) times daily.     insulin glargine 100 UNIT/ML injection  Commonly known as:  LANTUS  Inject 38 Units into the skin at bedtime. When NOT on insulin pump     insulin pump Soln  Inject 1 each into the skin 3 times daily with meals, bedtime and 2 AM. 38 units/day basal; bolus sliding scale     labetalol 100 MG tablet  Commonly known as:  NORMODYNE  Take 100 mg by mouth daily.     metoCLOPramide 5 MG tablet  Commonly known as:  REGLAN  Take 1 tablet (5 mg total) by mouth 3 (three) times daily before meals.     mometasone-formoterol 100-5 MCG/ACT Aero  Commonly known as:  DULERA  Inhale 2 puffs into the lungs 2 (two) times daily.     montelukast 10 MG tablet  Commonly known as:  SINGULAIR  TAKE 1 TABLET BY MOUTH AT BEDTIME     pantoprazole 40 MG tablet  Commonly known as:  PROTONIX  Take 1 tablet (40 mg total) by mouth every morning. 30 mins prior to breakfast     prenatal multivitamin Tabs tablet  Take 1 tablet by mouth daily at 12 noon.     PROAIR HFA 108 (90 Base) MCG/ACT inhaler  Generic drug:  albuterol  INHALE 2 PUFFS INTO LUNGS EVERY 6 HOURS AS NEEDED FOR SHORTNESS OF BREATH     QUEtiapine 400 MG tablet  Commonly known as:  SEROQUEL  Take 400 mg by mouth at bedtime.        Follow-up Information    Please follow up.   Why:  Cardiology will call you to set up an appointment. Return to [facility] ED for cardiac emergencies and to MAU for OB/Gyn emergencies.      Fatima Blank Certified Nurse-Midwife 02/01/2016  3:07 PM

## 2016-02-03 ENCOUNTER — Emergency Department (HOSPITAL_COMMUNITY)
Admission: EM | Admit: 2016-02-03 | Discharge: 2016-02-03 | Disposition: A | Discharge status: Home / Self Care | Payer: Medicaid Other | Attending: Emergency Medicine | Admitting: Emergency Medicine

## 2016-02-03 ENCOUNTER — Encounter (HOSPITAL_COMMUNITY): Payer: Self-pay | Admitting: *Deleted

## 2016-02-03 DIAGNOSIS — O99341 Other mental disorders complicating pregnancy, first trimester: Secondary | ICD-10-CM | POA: Insufficient documentation

## 2016-02-03 DIAGNOSIS — Z3A01 Less than 8 weeks gestation of pregnancy: Secondary | ICD-10-CM | POA: Diagnosis not present

## 2016-02-03 DIAGNOSIS — Z87442 Personal history of urinary calculi: Secondary | ICD-10-CM | POA: Diagnosis not present

## 2016-02-03 DIAGNOSIS — R079 Chest pain, unspecified: Secondary | ICD-10-CM | POA: Diagnosis not present

## 2016-02-03 DIAGNOSIS — R42 Dizziness and giddiness: Secondary | ICD-10-CM | POA: Insufficient documentation

## 2016-02-03 DIAGNOSIS — J45901 Unspecified asthma with (acute) exacerbation: Secondary | ICD-10-CM | POA: Insufficient documentation

## 2016-02-03 DIAGNOSIS — O24011 Pre-existing diabetes mellitus, type 1, in pregnancy, first trimester: Secondary | ICD-10-CM | POA: Insufficient documentation

## 2016-02-03 DIAGNOSIS — O10911 Unspecified pre-existing hypertension complicating pregnancy, first trimester: Secondary | ICD-10-CM | POA: Insufficient documentation

## 2016-02-03 DIAGNOSIS — O99511 Diseases of the respiratory system complicating pregnancy, first trimester: Secondary | ICD-10-CM | POA: Diagnosis not present

## 2016-02-03 DIAGNOSIS — O9989 Other specified diseases and conditions complicating pregnancy, childbirth and the puerperium: Secondary | ICD-10-CM | POA: Diagnosis not present

## 2016-02-03 DIAGNOSIS — F419 Anxiety disorder, unspecified: Secondary | ICD-10-CM | POA: Diagnosis not present

## 2016-02-03 DIAGNOSIS — Z79899 Other long term (current) drug therapy: Secondary | ICD-10-CM | POA: Insufficient documentation

## 2016-02-03 DIAGNOSIS — F329 Major depressive disorder, single episode, unspecified: Secondary | ICD-10-CM | POA: Diagnosis not present

## 2016-02-03 DIAGNOSIS — E10319 Type 1 diabetes mellitus with unspecified diabetic retinopathy without macular edema: Secondary | ICD-10-CM | POA: Diagnosis not present

## 2016-02-03 DIAGNOSIS — Z7951 Long term (current) use of inhaled steroids: Secondary | ICD-10-CM | POA: Insufficient documentation

## 2016-02-03 DIAGNOSIS — E1043 Type 1 diabetes mellitus with diabetic autonomic (poly)neuropathy: Secondary | ICD-10-CM | POA: Diagnosis not present

## 2016-02-03 DIAGNOSIS — R2243 Localized swelling, mass and lump, lower limb, bilateral: Secondary | ICD-10-CM | POA: Diagnosis not present

## 2016-02-03 DIAGNOSIS — M7989 Other specified soft tissue disorders: Secondary | ICD-10-CM

## 2016-02-03 DIAGNOSIS — Z794 Long term (current) use of insulin: Secondary | ICD-10-CM | POA: Insufficient documentation

## 2016-02-03 LAB — CBC
HEMATOCRIT: 27.4 % — AB (ref 36.0–46.0)
Hemoglobin: 8.9 g/dL — ABNORMAL LOW (ref 12.0–15.0)
MCH: 27 pg (ref 26.0–34.0)
MCHC: 32.5 g/dL (ref 30.0–36.0)
MCV: 83 fL (ref 78.0–100.0)
Platelets: 264 10*3/uL (ref 150–400)
RBC: 3.3 MIL/uL — ABNORMAL LOW (ref 3.87–5.11)
RDW: 14.1 % (ref 11.5–15.5)
WBC: 10 10*3/uL (ref 4.0–10.5)

## 2016-02-03 LAB — BASIC METABOLIC PANEL
Anion gap: 9 (ref 5–15)
BUN: 20 mg/dL (ref 6–20)
CO2: 21 mmol/L — ABNORMAL LOW (ref 22–32)
Calcium: 9 mg/dL (ref 8.9–10.3)
Chloride: 105 mmol/L (ref 101–111)
Creatinine, Ser: 1.25 mg/dL — ABNORMAL HIGH (ref 0.44–1.00)
GFR calc Af Amer: >60 mL/min (ref >60)
GFR, EST NON AFRICAN AMERICAN: 57 mL/min — AB (ref >60)
GLUCOSE: 284 mg/dL — AB (ref 65–99)
POTASSIUM: 4.7 mmol/L (ref 3.5–5.1)
Sodium: 135 mmol/L (ref 135–145)

## 2016-02-03 LAB — I-STAT TROPONIN, ED
Troponin i, poc: 0.02 ng/mL (ref 0.00–0.08)
Troponin i, poc: 0.02 ng/mL (ref 0.00–0.08)

## 2016-02-03 LAB — POC OCCULT BLOOD, ED: FECAL OCCULT BLD: NEGATIVE

## 2016-02-03 NOTE — ED Notes (Signed)
Ambulated Pt. Pt did not feel dizzy or weak. SpO2 stayed around 99-100%, dipping to 97% only momentarily. The Pt's heart rate ranged from 100-104 bpm.

## 2016-02-03 NOTE — ED Notes (Signed)
Pt verbalized understanding of discharge instructions and follow-up care. Denies chest pain at this time. Vital signs stable.

## 2016-02-03 NOTE — Discharge Instructions (Signed)
Please follow up with your OB/GYN at your scheduled appointment tomorrow morning. Please return to the ER for new or worsening symptoms, any additional concerns.

## 2016-02-03 NOTE — ED Notes (Signed)
Called for triage x1.

## 2016-02-03 NOTE — ED Provider Notes (Signed)
CSN: [id number]     Arrival date & time 02/03/16  1429 History   First MD Initiated Contact with Patient 02/03/16 1754     Chief Complaint  Patient presents with  . Chest Pain     (Consider location/radiation/quality/duration/timing/severity/associated sxs/prior Treatment) Patient is a 30 y.o. female presenting with chest pain. The history is provided by the patient and medical records. No language interpreter was used.  Chest Pain Associated symptoms: fatigue, palpitations and shortness of breath   Associated symptoms: no abdominal pain, no back pain, no cough, no fever, no nausea and not vomiting    [name] is a 30 y.o. female  7 weeks 3 days pregnant by 6 weeks U/S with a PMH of HTN, DM, asthma who presents to the Emergency Department complaining of intermittent sharp nonradiating chest pain which lasts approximately 5 minutes over the last 5-7 days. Associated symptoms include shortness of breath, feeling lightheaded, lower extremity swelling left greater than right. She was seen for the same at [facility] on 4/25 (2 days ago), where she was started on iron for anemia and taking it as directed, and told to follow up with OB and cardiology. She has not received a call to schedule cardiology appointment. Lower extremity swelling has continued to worsen, as well as her lightheadedness and decreased energy level.  Past Medical History  Diagnosis Date  . Asthma   . Tachycardia     baseline tachycardia   . 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[place])   . Diabetic gastroparesis ([place])   . 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 Diabetic neuropathy, type I diabetes mellitus ([place])   . Polyneuropathy in diabetes(357.2)   . Anxiety   . Hypertension   . Depression   . Asthma 11/10/2013  . Headache(784.0)   . Kidney stones    Past Surgical History  Procedure Laterality Date  . Cholecystectomy    . Cesarean section    . Refractive surgery      Family History  Problem Relation Age of Onset  . Hypertension     Social History  Substance Use Topics  . Smoking status: Never Smoker   . Smokeless tobacco: Never Used  . Alcohol Use: No   OB History    Gravida Para Term Preterm AB TAB SAB Ectopic Multiple Living   2 1        1      Review of Systems  Constitutional: Positive for fatigue. Negative for fever.  HENT: Negative for congestion.   Respiratory: Positive for shortness of breath. Negative for cough and wheezing.   Cardiovascular: Positive for chest pain, palpitations and leg swelling.  Gastrointestinal: Negative for nausea, vomiting, abdominal pain and blood in stool.  Genitourinary: Negative for dysuria, vaginal bleeding, vaginal discharge and vaginal pain.  Musculoskeletal: Negative for back pain.  Skin: Negative for color change.  Neurological: Positive for light-headedness. Negative for syncope.  Hematological: Does not bruise/bleed easily.      Allergies  Ceftin  Home Medications   Prior to Admission medications   Medication Sig Start Date End Date Taking? Authorizing Provider  brimonidine-timolol (COMBIGAN) 0.2-0.5 % ophthalmic solution Place 1 drop into both eyes every 12 (twelve) hours. For Glaucoma 06/03/14  Yes Encarnacion Slates, NP  docusate sodium (COLACE) 100 MG capsule Take 1 capsule (100 mg total) by mouth 2 (two) times daily as needed. 02/01/16  Yes Lisa A Leftwich-Kirby, CNM  ferrous sulfate (FERROUSUL) 325 (65 FE) MG  tablet Take 1 tablet (325 mg total) by mouth 2 (two) times daily. 02/01/16  Yes Lisa A Leftwich-Kirby, CNM  fluticasone (FLONASE) 50 MCG/ACT nasal spray Place 2 sprays into both nostrils daily as needed for allergies or rhinitis.   Yes Historical Provider, MD  gabapentin (NEURONTIN) 600 MG tablet Take 600 mg by mouth at bedtime.    Yes Historical Provider, MD  insulin glargine (LANTUS) 100 UNIT/ML injection Inject 38 Units into the skin at bedtime. When NOT on insulin pump   Yes Historical  Provider, MD  Insulin Human (INSULIN PUMP) SOLN Inject 1 each into the skin 3 times daily with meals, bedtime and 2 AM. 38 units/day basal; bolus sliding scale   Yes Historical Provider, MD  labetalol (NORMODYNE) 100 MG tablet Take 100 mg by mouth daily.   Yes Historical Provider, MD  metoCLOPramide (REGLAN) 5 MG tablet Take 1 tablet (5 mg total) by mouth 3 (three) times daily before meals. 12/06/15  Yes Nelida Meuse III, MD  mometasone-formoterol (DULERA) 100-5 MCG/ACT AERO Inhale 2 puffs into the lungs 2 (two) times daily.   Yes Historical Provider, MD  montelukast (SINGULAIR) 10 MG tablet TAKE 1 TABLET BY MOUTH AT BEDTIME 02/24/15  Yes [name], MD  pantoprazole (PROTONIX) 40 MG tablet Take 1 tablet (40 mg total) by mouth every morning. 30 mins prior to breakfast 11/24/15  Yes Lori P Hvozdovic, PA-C  Prenatal Vit-Fe Fumarate-FA (PRENATAL MULTIVITAMIN) TABS tablet Take 1 tablet by mouth daily at 12 noon.   Yes Historical Provider, MD  PROAIR HFA 108 (90 BASE) MCG/ACT inhaler INHALE 2 PUFFS INTO LUNGS EVERY 6 HOURS AS NEEDED FOR SHORTNESS OF BREATH 08/26/15  Yes Orlena [place], PA-C  QUEtiapine (SEROQUEL) 400 MG tablet Take 400 mg by mouth at bedtime.   Yes Historical Provider, MD   BP 137/69 mmHg  Pulse 106  Temp(Src) 98.1 F (36.7 C) (Oral)  Resp 20  SpO2 99%  LMP 12/13/2015 Physical Exam  Constitutional: She is oriented to person, place, and time. She appears well-developed and well-nourished.  Alert and in no acute distress  HENT:  Head: Normocephalic and atraumatic.  Cardiovascular: Normal rate, regular rhythm, normal heart sounds and intact distal pulses.  Exam reveals no gallop and no friction rub.   No murmur heard. Pulmonary/Chest: Effort normal and breath sounds normal. No respiratory distress. She has no wheezes. She has no rales. She exhibits no tenderness.  100% O2 on RA, speaking in full sentences.   Abdominal: Soft. Bowel sounds are normal. She exhibits no distension  and no mass. There is no tenderness. There is no rebound and no guarding.  Musculoskeletal:  Bilateral LE swelling L>R. Full ROM. Negative homan's sign and no calf tenderness bilaterally. No erythema. 2+ distal pulses. Sensation intact.   Neurological: She is alert and oriented to person, place, and time.  Skin: Skin is warm and dry.  Nursing note and vitals reviewed.   ED Course  Procedures (including critical care time) Labs Review Labs Reviewed  BASIC METABOLIC PANEL - Abnormal; Notable for the following:    CO2 21 (*)    Glucose, Bld 284 (*)    Creatinine, Ser 1.25 (*)    GFR calc non Af Amer 57 (*)    All other components within normal limits  CBC - Abnormal; Notable for the following:    RBC 3.30 (*)    Hemoglobin 8.9 (*)    HCT 27.4 (*)    All other components within normal  limits  I-STAT TROPOININ, ED  I-STAT TROPOININ, ED  POC OCCULT BLOOD, ED    Imaging Review No results found. I have personally reviewed and evaluated these images and lab results as part of my medical decision-making.   EKG Interpretation   Date/Time:  Thursday February 03 2016 14:39:27 EDT Ventricular Rate:  92 PR Interval:  126 QRS Duration: 80 QT Interval:  360 QTC Calculation: 445 R Axis:   90 Text Interpretation:  Normal sinus rhythm Rightward axis Borderline ECG  since last tracing no significant change Confirmed by BELFI  MD, MELANIE  ([id number]) on 02/03/2016 6:52:18 PM      MDM   Final diagnoses:  Swelling of lower extremity   [name] is a 30 y.o. [redacted] week pregnant female who presents to ED for chest pain, sob, lightheadedness. On exam, well-appearing, speaking in full sentences, left lower extremity swelling greater than right. [facility]   Labs: CBC with anemia: 8.9/27.4 (4/25 H&H was 9.5/28.3) -- hemoccult negative, no vaginal bleeding. Delta trop 0.02 BMP with elevated creatinine, 1.25, BUN within defined limits. EKG  reviewed.  Patient ambulated in ED O2 lowest at 97% heart rate highest at 104. Patient has appointment with her OB/GYN tomorrow morning at 9 AM. I believe the is safe for discharge to home with strict return precautions and OBGYN follow up. Stressed the importance of making it to this appointment for further evaluation, recheck H and H. Doppler ultrasound ordered as outpatient. Return precautions were given and all questions answered.  Patient discussed with Dr. Tamera Punt who agrees with treatment plan.  [facility] Martika Egler, PA-C 02/03/16 2101  Malvin Johns, MD 02/03/16 2240

## 2016-02-03 NOTE — ED Notes (Signed)
Pt states that [facility] but she continues to have chest pain, sob, dizziness and lightheadedness. Pt is approx [redacted] weeks pregnant and had low hgb.

## 2016-02-04 ENCOUNTER — Ambulatory Visit (HOSPITAL_COMMUNITY): Admission: RE | Admit: 2016-02-04 | Payer: Medicaid Other | Source: Ambulatory Visit

## 2016-02-28 ENCOUNTER — Ambulatory Visit (HOSPITAL_COMMUNITY)
Admission: EM | Admit: 2016-02-28 | Discharge: 2016-02-28 | Disposition: A | Discharge status: Home / Self Care | Payer: Medicaid Other | Attending: Family Medicine | Admitting: Family Medicine

## 2016-02-28 ENCOUNTER — Encounter (HOSPITAL_COMMUNITY): Payer: Self-pay | Admitting: Nurse Practitioner

## 2016-02-28 ENCOUNTER — Ambulatory Visit (INDEPENDENT_AMBULATORY_CARE_PROVIDER_SITE_OTHER): Payer: Medicaid Other

## 2016-02-28 DIAGNOSIS — M25475 Effusion, left foot: Secondary | ICD-10-CM | POA: Diagnosis not present

## 2016-02-28 NOTE — ED Notes (Signed)
Pt c/o 2 week history of L foot pain and swelling. She denies any injuries. She tried heat and ice with no relief.

## 2016-02-28 NOTE — ED Provider Notes (Signed)
CSN: [id number]     Arrival date & time 02/28/16  1328 History   First MD Initiated Contact with Patient 02/28/16 1519     Chief Complaint  Patient presents with  . Foot Pain   HPI Pt is a 30 y.o. G2P1 at [redacted]w[redacted]d who presents for foot pain and swelling. Pt reports she has had swelling of her left foot and ankle for about 1 month. For the past week the swelling has worsened and is no longer going away during the night while elevated. She has also developed pain on the top of her foot that is worse with ambulation. She denies any injuries that she remembers. She was admitted to [facility] last month when the swelling developed and at that time had a positive d-dimer but negative LE dopplers and VQ scan. A kidney biopsy is pending and she has been prescribed compression stockings but has not yet gotten these. She denies recent travel, surgery, cancer.   Past Medical History  Diagnosis Date  . Asthma   . Tachycardia     baseline tachycardia   . 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[place])   . Diabetic gastroparesis ([place])   . 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 Diabetic neuropathy, type I diabetes mellitus ([place])   . Polyneuropathy in diabetes(357.2)   . Anxiety   . Hypertension   . Depression   . Asthma 11/10/2013  . Headache(784.0)   . Kidney stones    Past Surgical History  Procedure Laterality Date  . Cholecystectomy    . Cesarean section    . Refractive surgery     Family History  Problem Relation Age of Onset  . Hypertension     Social History  Substance Use Topics  . Smoking status: Never Smoker   . Smokeless tobacco: Never Used  . Alcohol Use: No   OB History    Gravida Para Term Preterm AB TAB SAB Ectopic Multiple Living   2 1        1      Review of Systems See HPI  Allergies  Ceftin  Home Medications   Prior to Admission medications   Medication Sig Start Date End Date Taking? Authorizing Provider  brimonidine-timolol  (COMBIGAN) 0.2-0.5 % ophthalmic solution Place 1 drop into both eyes every 12 (twelve) hours. For Glaucoma 06/03/14   Encarnacion Slates, NP  docusate sodium (COLACE) 100 MG capsule Take 1 capsule (100 mg total) by mouth 2 (two) times daily as needed. 02/01/16   Kathie Dike Leftwich-Kirby, CNM  ferrous sulfate (FERROUSUL) 325 (65 FE) MG tablet Take 1 tablet (325 mg total) by mouth 2 (two) times daily. 02/01/16   Lisa A Leftwich-Kirby, CNM  fluticasone (FLONASE) 50 MCG/ACT nasal spray Place 2 sprays into both nostrils daily as needed for allergies or rhinitis.    Historical Provider, MD  gabapentin (NEURONTIN) 600 MG tablet Take 600 mg by mouth at bedtime.     Historical Provider, MD  insulin glargine (LANTUS) 100 UNIT/ML injection Inject 38 Units into the skin at bedtime. When NOT on insulin pump    Historical Provider, MD  Insulin Human (INSULIN PUMP) SOLN Inject 1 each into the skin 3 times daily with meals, bedtime and 2 AM. 38 units/day basal; bolus sliding scale    Historical Provider, MD  labetalol (NORMODYNE) 100 MG tablet Take 100 mg by mouth daily.    Historical Provider, MD  metoCLOPramide (REGLAN) 5 MG tablet Take 1 tablet (  5 mg total) by mouth 3 (three) times daily before meals. 12/06/15   Nelida Meuse III, MD  mometasone-formoterol (DULERA) 100-5 MCG/ACT AERO Inhale 2 puffs into the lungs 2 (two) times daily.    Historical Provider, MD  montelukast (SINGULAIR) 10 MG tablet TAKE 1 TABLET BY MOUTH AT BEDTIME 02/24/15   [name], MD  pantoprazole (PROTONIX) 40 MG tablet Take 1 tablet (40 mg total) by mouth every morning. 30 mins prior to breakfast 11/24/15   Lori P Hvozdovic, PA-C  Prenatal Vit-Fe Fumarate-FA (PRENATAL MULTIVITAMIN) TABS tablet Take 1 tablet by mouth daily at 12 noon.    Historical Provider, MD  PROAIR HFA 108 (90 BASE) MCG/ACT inhaler INHALE 2 PUFFS INTO LUNGS EVERY 6 HOURS AS NEEDED FOR SHORTNESS OF BREATH 08/26/15   Orlena Sheldon, PA-C  QUEtiapine (SEROQUEL) 400 MG tablet Take 400  mg by mouth at bedtime.    Historical Provider, MD   Meds Ordered and Administered this Visit  Medications - No data to display  BP 134/79 mmHg  Pulse 87  Temp(Src) 98.7 F (37.1 C) (Oral)  Resp 16  SpO2 100%  LMP 12/13/2015 No data found.   Physical Exam  Constitutional: She is oriented to person, place, and time. She appears well-developed and well-nourished. No distress.  HENT:  Head: Normocephalic and atraumatic.  Right Ear: External ear normal.  Left Ear: External ear normal.  Eyes: Conjunctivae are normal. Pupils are equal, round, and reactive to light. Right eye exhibits no discharge. Left eye exhibits no discharge.  Neck: Normal range of motion. Neck supple.  Cardiovascular: Normal rate, regular rhythm, normal heart sounds and intact distal pulses.   No murmur heard. Pulmonary/Chest: Effort normal and breath sounds normal. No respiratory distress. She has no wheezes.  Abdominal: Soft. She exhibits no distension. There is no tenderness.  Musculoskeletal:       Left ankle: She exhibits swelling. She exhibits normal range of motion and no ecchymosis. No tenderness. Achilles tendon normal.       Left foot: There is tenderness and swelling. There is normal range of motion, no bony tenderness, normal capillary refill, no deformity and no laceration.       Feet:  Neurological: She is alert and oriented to person, place, and time.  Skin: Skin is warm and dry. She is not diaphoretic.  Psychiatric: She has a normal mood and affect. Her behavior is normal.  Nursing note and vitals reviewed.   ED Course  Procedures (including critical care time)  Labs Review Labs Reviewed - No data to display  Imaging Review Dg Foot Complete Left  02/28/2016  CLINICAL DATA:  Left foot pain for 1 week.  No known injury. EXAM: LEFT FOOT - COMPLETE 3+ VIEW COMPARISON:  None. FINDINGS: Soft tissue swelling along the dorsum of the foot over the metatarsals. No acute bony abnormality.  Specifically, no fracture, subluxation, or dislocation. Soft tissues are intact. IMPRESSION: No acute bony abnormality. Electronically Signed   By: Rolm Baptise M.D.   On: 02/28/2016 15:44      MDM   1. Swelling of foot joint, left    Foot pain likely related to 1 month of edema currently being worked up by [employer] and nephrology at [facility]. Xray negative for bony abnormality. Recommend compression and elevation, tylenol prn pain. Can compress with ace wrap until able to obtain compression stockings. F/u with OB and nephro as scheduled.  Patient discussed with and examined by Dr. Juventino Slovak who agrees with  assessment and plan.     Frazier Richards, MD 02/28/16 [phone]

## 2016-02-28 NOTE — Discharge Instructions (Signed)
How to Use Compression Stockings  Compression stockings are elastic socks that squeeze the legs. They help to increase blood flow to the legs, decrease swelling in the legs, and reduce the chance of developing blood clots in the lower legs. Compression stockings are often used by people who:  · Are recovering from surgery.  · Have poor circulation in their legs.  · Are prone to getting blood clots in their legs.  · Have varicose veins.  · Sit or stay in bed for long periods of time.  HOW TO USE COMPRESSION STOCKINGS  Before you put on your compression stockings:  · Make sure that they are the correct size. If you do not know your size, ask your health care provider.  · Make sure that they are clean, dry, and in good condition.  · Check them for rips and tears. Do not put them on if they are ripped or torn.  Put your stockings on first thing in the morning, before you get out of bed. Keep them on for as long as your health care provider advises. When you are wearing your stockings:  · Keep them as smooth as possible. Do not allow them to bunch up. It is especially important to prevent the stockings from bunching up around your toes or behind your knees.  · Do not roll the stockings downward and leave them rolled down. This can decrease blood flow to your leg.  · Change them right away if they become wet or dirty.  When you take off your stockings, inspect your legs and feet. Anything that does not seem normal may require medical attention. Look for:  · Open sores.  · Red spots.  · Swelling.  INFORMATION AND TIPS  · Do not stop wearing your compression stockings without talking to your health care provider first.  · Wash your stockings everyday with mild detergent in cold or warm water. Do not use bleach. Air-dry your stockings or dry them in a clothes dryer on low heat.  · Replace your stockings every 3-6 months.  · If skin moisturizing is part of your treatment plan, apply lotion or cream at night so that your skin  will be dry when you put on the stockings in the morning. It is harder to put the stockings on when you have lotion on your legs or feet.  SEEK MEDICAL CARE IF:  Remove your stockings and seek medical care if:  · You have a feeling of pins and needles in your feet or legs.  · You have any new changes in your skin.  · You have skin lesions that are getting worse.  · You have swelling or pain that is getting worse.  SEEK IMMEDIATE MEDICAL CARE IF:  · You have numbness or tingling in your lower legs that does not get better immediately after you take the stockings off.  · Your toes or feet become cold and blue.  · You develop open sores or red spots on your legs that do not go away.  · You see or feel a warm spot on your leg.  · You have new swelling or soreness in your leg.  · You are short of breath or you have chest pain for no reason.  · You have a rapid or irregular heartbeat.  · You feel light-headed or dizzy.     This information is not intended to replace advice given to you by your health care provider. Make sure you discuss any   questions you have with your health care provider.     Document Released: 07/23/2009 Document Revised: 02/09/2015 Document Reviewed: 09/02/2014  Elsevier Interactive Patient Education ©2016 [employer] Inc[employer]

## 2016-03-01 DIAGNOSIS — E1021 Type 1 diabetes mellitus with diabetic nephropathy: Secondary | ICD-10-CM | POA: Insufficient documentation

## 2016-04-21 ENCOUNTER — Telehealth: Payer: Self-pay | Admitting: Family Medicine

## 2016-04-21 ENCOUNTER — Encounter: Payer: Self-pay | Admitting: Family Medicine

## 2016-04-21 ENCOUNTER — Ambulatory Visit (INDEPENDENT_AMBULATORY_CARE_PROVIDER_SITE_OTHER): Payer: Medicaid Other | Admitting: Family Medicine

## 2016-04-21 VITALS — BP 110/74 | HR 98 | Temp 98.2°F | Resp 18 | Ht 66.0 in | Wt 240.0 lb

## 2016-04-21 DIAGNOSIS — L03119 Cellulitis of unspecified part of limb: Secondary | ICD-10-CM | POA: Diagnosis not present

## 2016-04-21 DIAGNOSIS — L02419 Cutaneous abscess of limb, unspecified: Secondary | ICD-10-CM

## 2016-04-21 MED ORDER — CLINDAMYCIN HCL 300 MG PO CAPS: 300.0000 mg | ORAL_CAPSULE | Freq: Three times a day (TID) | ORAL | Status: DC

## 2016-04-21 MED ORDER — MONTELUKAST SODIUM 10 MG PO TABS: 10.0000 mg | ORAL_TABLET | Freq: Every day | ORAL | Status: DC

## 2016-04-21 NOTE — Telephone Encounter (Signed)
Pt's pharmacy is calling to request that we send in a prescription for montelukast. [facility]

## 2016-04-21 NOTE — Progress Notes (Signed)
Subjective:    Patient ID: [name], female    DOB: 1986/05/17, 30 y.o.   MRN: [id number]  HPI A she is a 30 year old type I diabetic who is also in her second trimester. She has a 2 cm x 2 cm erythematous firm area on the posterior aspect of her right hamstring 2 days. 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 Because of the second trimester, she is category D for doxycycline and Bactrim leading clindamycin is my only safe alternative. There is no fluctuance today. It appears to be in evolving abscess but does not require drainage at the present time Past Medical History  Diagnosis Date  . Asthma   . Tachycardia     baseline tachycardia   . 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[place])   . Diabetic gastroparesis ([place])   . 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)   . Diabetic neuropathy, type I diabetes mellitus ([place])   . Polyneuropathy in diabetes(357.2)   . Anxiety   . Hypertension   . Depression   . Asthma 11/10/2013  . Headache(784.0)   . Kidney stones    Past Surgical History  Procedure Laterality Date  . Cholecystectomy    . Cesarean section    . Refractive surgery     Current Outpatient Prescriptions on File Prior to Visit  Medication Sig Dispense Refill  . brimonidine-timolol (COMBIGAN) 0.2-0.5 % ophthalmic solution Place 1 drop into both eyes every 12 (twelve) hours. For Glaucoma    . docusate sodium (COLACE) 100 MG capsule Take 1 capsule (100 mg total) by mouth 2 (two) times daily as needed. 30 capsule 2  . ferrous sulfate (FERROUSUL) 325 (65 FE) MG tablet Take 1 tablet (325 mg total) by mouth 2 (two) times daily. 60 tablet 1  . fluticasone (FLONASE) 50 MCG/ACT nasal spray Place 2 sprays into both nostrils daily as needed for allergies or rhinitis.    . Insulin Human (INSULIN PUMP) SOLN Inject 1 each into the skin 3 times daily with meals, bedtime and 2 AM. 38 units/day basal; bolus sliding scale    .  labetalol (NORMODYNE) 100 MG tablet Take 200 mg by mouth daily.     . metoCLOPramide (REGLAN) 5 MG tablet Take 1 tablet (5 mg total) by mouth 3 (three) times daily before meals. 90 tablet 1  . mometasone-formoterol (DULERA) 100-5 MCG/ACT AERO Inhale 2 puffs into the lungs 2 (two) times daily.    . montelukast (SINGULAIR) 10 MG tablet TAKE 1 TABLET BY MOUTH AT BEDTIME 30 tablet 11  . pantoprazole (PROTONIX) 40 MG tablet Take 1 tablet (40 mg total) by mouth every morning. 30 mins prior to breakfast 30 tablet 6  . Prenatal Vit-Fe Fumarate-FA (PRENATAL MULTIVITAMIN) TABS tablet Take 1 tablet by mouth daily at 12 noon.    Marland Kitchen PROAIR HFA 108 (90 BASE) MCG/ACT inhaler INHALE 2 PUFFS INTO LUNGS EVERY 6 HOURS AS NEEDED FOR SHORTNESS OF BREATH 18 g 3  . gabapentin (NEURONTIN) 600 MG tablet Take 600 mg by mouth at bedtime. Reported on 04/21/2016    . insulin glargine (LANTUS) 100 UNIT/ML injection Inject 38 Units into the skin at bedtime. Reported on 04/21/2016     No current facility-administered medications on file prior to visit.   Allergies  Allergen Reactions  . Ceftin Rash   Social History   Social History  . Marital Status: Married    Spouse Name: N/A  . Number of  Children: N/A  . Years of Education: N/A   Occupational History  . Not on file.   Social History Main Topics  . Smoking status: Never Smoker   . Smokeless tobacco: Never Used  . Alcohol Use: No  . Drug Use: No  . Sexual Activity:    Partners: Male   Other Topics Concern  . Not on file   Social History Narrative   Pt has a daughter and boyfriend.             Review of Systems  All other systems reviewed and are negative.      Objective:   Physical Exam  Cardiovascular: Normal rate, regular rhythm and normal heart sounds.   Pulmonary/Chest: Effort normal and breath sounds normal. No respiratory distress. She has no wheezes. She has no rales.  Skin: Skin is warm. There is erythema.  Vitals reviewed.          Assessment & Plan:  Cellulitis and abscess of leg - Plan: clindamycin (CLEOCIN) 300 MG capsule  Clindamycin 300 mg by mouth 3 times a day for 10 days. Use warm compresses 3 times a day. Recheck immediately if worsening. At the present time there is no indication for incision and drainage as there is no fluctuance.

## 2016-04-21 NOTE — Telephone Encounter (Signed)
Medication called/sent to requested pharmacy  

## 2016-06-16 ENCOUNTER — Telehealth: Payer: Self-pay | Admitting: Family Medicine

## 2016-06-16 NOTE — Telephone Encounter (Signed)
Pt is [redacted] weeks pregnant.  Treated at ED in [place] 2 days ago for URI.  Says called OB today for cough and was called something in by them for that.  Now concerned because it is now flaring up her asthma.  Has had to use Inhaler x 3 in last 2 hrs.  Wanted [name] to work her in today.  Told patient unable to see today, if asthma is really bad may need to return to ED or Urgent but recommended that she be seen since she is pregnant.

## 2016-06-16 NOTE — Telephone Encounter (Signed)
agree

## 2016-06-19 ENCOUNTER — Telehealth: Payer: Self-pay | Admitting: Family Medicine

## 2016-06-19 MED ORDER — ALBUTEROL SULFATE (2.5 MG/3ML) 0.083% IN NEBU: 2.5000 mg | INHALATION_SOLUTION | RESPIRATORY_TRACT | 1 refills | Status: DC | PRN

## 2016-06-19 NOTE — Telephone Encounter (Signed)
Medication called/sent to requested pharmacy  

## 2016-06-19 NOTE — Telephone Encounter (Signed)
Ok to refill 

## 2016-06-19 NOTE — Telephone Encounter (Signed)
Requesting refill on Albuterol medication for nebs - Ok to refill??

## 2016-09-19 ENCOUNTER — Other Ambulatory Visit: Payer: Self-pay | Admitting: Physician Assistant

## 2016-09-19 NOTE — Telephone Encounter (Signed)
rx filled per protocol  

## 2016-10-13 ENCOUNTER — Telehealth: Payer: Self-pay | Admitting: Family Medicine

## 2016-10-13 MED ORDER — SULFAMETHOXAZOLE-TRIMETHOPRIM 800-160 MG PO TABS: 1.0000 | ORAL_TABLET | Freq: Two times a day (BID) | ORAL | 0 refills | Status: DC

## 2016-10-13 NOTE — Telephone Encounter (Signed)
Call placed to patient and patient made aware.   Prescription sent to pharmacy.  

## 2016-10-13 NOTE — Telephone Encounter (Signed)
Patient is calling because she has a mrsa flare up would like to know if she can get [facility] if [facility] today  [phone]

## 2016-10-13 NOTE — Telephone Encounter (Signed)
Bactrim ds bid for 7 days

## 2016-10-19 LAB — HM DIABETES EYE EXAM

## 2016-10-22 ENCOUNTER — Ambulatory Visit (HOSPITAL_COMMUNITY)
Admission: EM | Admit: 2016-10-22 | Discharge: 2016-10-22 | Disposition: A | Discharge status: Home / Self Care | Payer: BLUE CROSS/BLUE SHIELD

## 2016-10-22 ENCOUNTER — Encounter (HOSPITAL_COMMUNITY): Payer: Self-pay | Admitting: Emergency Medicine

## 2016-10-22 DIAGNOSIS — J40 Bronchitis, not specified as acute or chronic: Secondary | ICD-10-CM | POA: Diagnosis not present

## 2016-10-22 DIAGNOSIS — R05 Cough: Secondary | ICD-10-CM | POA: Diagnosis not present

## 2016-10-22 DIAGNOSIS — J020 Streptococcal pharyngitis: Secondary | ICD-10-CM

## 2016-10-22 DIAGNOSIS — R059 Cough, unspecified: Secondary | ICD-10-CM

## 2016-10-22 MED ORDER — ALBUTEROL SULFATE (2.5 MG/3ML) 0.083% IN NEBU: 2.5000 mg | INHALATION_SOLUTION | RESPIRATORY_TRACT | 1 refills | Status: DC | PRN

## 2016-10-22 MED ORDER — ALBUTEROL SULFATE (5 MG/ML) 0.5% IN NEBU: 2.5000 mg | INHALATION_SOLUTION | Freq: Four times a day (QID) | RESPIRATORY_TRACT | 12 refills | Status: DC | PRN

## 2016-10-22 MED ORDER — PREDNISONE 10 MG (21) PO TBPK: 10.0000 mg | ORAL_TABLET | Freq: Every day | ORAL | 0 refills | Status: DC

## 2016-10-22 MED ORDER — AZITHROMYCIN 250 MG PO TABS: 250.0000 mg | ORAL_TABLET | Freq: Every day | ORAL | 0 refills | Status: DC

## 2016-10-22 NOTE — ED Provider Notes (Signed)
CSN: [id number]     Arrival date & time 10/22/16  1210 History   First MD Initiated Contact with Patient 10/22/16 1350     Chief Complaint  Patient presents with  . Cough  . Sore Throat   (Consider location/radiation/quality/duration/timing/severity/associated sxs/prior Treatment) C/o sore throat, cough, generalized bodyaches. Pt is concerned that she has strep again. She is prone to this. Also has a hx of asthma and bronchitis. Daughter has RSV and she has had cough and having to use her albuterol inhal more than normal. Taking motrin with minimal relief. Is running out of neb albuterol for machine and would like to have a refill on this.       Past Medical History:  Diagnosis Date  . Anxiety   . Asthma   . Asthma 11/10/2013  . Depression   . Diabetic gastroparesis ([place])   . Diabetic neuropathy, type I diabetes mellitus ([place])   . Headache(784.0)   . Hypertension   . Kidney stones   . Polyneuropathy in diabetes(357.2)   . 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[place])   . Tachycardia    baseline tachycardia   . 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 CESAREAN SECTION    . CHOLECYSTECTOMY    . REFRACTIVE SURGERY     Family History  Problem Relation Age of Onset  . Hypertension     Social History  Substance Use Topics  . Smoking status: Never Smoker  . Smokeless tobacco: Never Used  . Alcohol use No   OB History    Gravida Para Term Preterm AB Living   2 1       1    SAB TAB Ectopic Multiple Live Births           1     Review of Systems  Constitutional: Positive for fatigue.  HENT: Positive for congestion and sore throat.   Eyes: Negative.   Respiratory: Positive for cough and shortness of breath.   Cardiovascular: Negative.   Gastrointestinal: Negative.   Genitourinary: Negative.   Skin: Negative.   Neurological: Negative.     Allergies  Ceftin  Home Medications    Prior to Admission medications   Medication Sig Start Date End Date Taking? Authorizing Provider  enalapril (VASOTEC) 10 MG tablet Take 10 mg by mouth daily.   Yes Historical Provider, MD  Insulin Human (INSULIN PUMP) SOLN Inject 1 each into the skin 3 times daily with meals, bedtime and 2 AM. 38 units/day basal; bolus sliding scale   Yes Historical Provider, MD  metoCLOPramide (REGLAN) 5 MG tablet Take 1 tablet (5 mg total) by mouth 3 (three) times daily before meals. 12/06/15  Yes Nelida Meuse III, MD  montelukast (SINGULAIR) 10 MG tablet Take 1 tablet (10 mg total) by mouth at bedtime. 04/21/16  Yes [name], MD  PROAIR HFA 108 [phone] Base) MCG/ACT inhaler INHALE 2 PUFFS INTO LUNGS EVERY 6 HOURS AS NEEDED FOR SHORTNESS OF BREATH 09/19/16  Yes Orlena Sheldon, PA-C  albuterol (PROVENTIL) (2.5 MG/3ML) 0.083% nebulizer solution Take 3 mLs (2.5 mg total) by nebulization every 4 (four) hours as needed for wheezing or shortness of breath. 10/22/16 04/17/18  Melanee Left, NP  azithromycin (ZITHROMAX) 250 MG tablet Take 1 tablet (250 mg total) by mouth daily. Take first 2 tablets together, then 1 every day until finished. 10/22/16   Melanee Left, NP  mometasone-formoterol (DULERA) 100-5 MCG/ACT AERO Inhale 2 puffs into the lungs 2 (two) times daily.    Historical Provider, MD  predniSONE (STERAPRED UNI-PAK 21 TAB) 10 MG (21) TBPK tablet Take 1 tablet (10 mg total) by mouth daily. Take 6 tabs by mouth daily  for 2 days, then 5 tabs for 2 days, then 4 tabs for 2 days, then 3 tabs for 2 days, 2 tabs for 2 days, then 1 tab by mouth daily for 2 days 10/22/16   Melanee Left, NP   Meds Ordered and Administered this Visit  Medications - No data to display  BP 134/80 (BP Location: Left Arm)   Pulse 95   Temp 98.1 F (36.7 C) (Oral)   Resp 20   LMP 12/13/2015   SpO2 100%   Breastfeeding? No  No data found.   Physical Exam  Constitutional: She appears well-developed.  HENT:  Head:  Normocephalic.  Oral erythema, pustula's to generalized areas of mouth and throat area.   Eyes: Pupils are equal, round, and reactive to light.  Neck: Normal range of motion.  Cardiovascular: Normal rate and regular rhythm.   Pulmonary/Chest:  Non productive cough, wheezing at lower base lobe bil.   Abdominal: Soft. Bowel sounds are normal.  Musculoskeletal: Normal range of motion.  Neurological: She is alert.  Skin: Skin is warm.    Urgent Care Course   Clinical Course     Procedures (including critical care time)  Labs Review Labs Reviewed - No data to display  Imaging Review No results found.               MDM   1. Strep pharyngitis   2. Bronchitis   3. Cough    Wash hands well Monitor your sugar levels due to the steroids can cause an increase in your levels.  Take the albuterol as needed Wash your neb tubing or obtain a new set Push fluids     Melanee Left, NP 10/22/16 1404

## 2016-10-22 NOTE — ED Triage Notes (Signed)
The patient presented to the [facility] with a complaint of a cough and sore throat x 3 days. The patient stated that it has increased her inhaler use due to exacerbation of her asthma.

## 2016-10-22 NOTE — Discharge Instructions (Signed)
Wash hands well Monitor your sugar levels due to the steroids can cause an increase in your levels.  Take the albuterol as needed Wash your neb tubing or obtain a new set Push fluids

## 2016-10-25 ENCOUNTER — Emergency Department (HOSPITAL_COMMUNITY)
Admission: EM | Admit: 2016-10-25 | Discharge: 2016-10-25 | Disposition: A | Discharge status: Home / Self Care | Payer: BLUE CROSS/BLUE SHIELD | Attending: Emergency Medicine | Admitting: Emergency Medicine

## 2016-10-25 ENCOUNTER — Encounter (HOSPITAL_COMMUNITY): Payer: Self-pay | Admitting: Emergency Medicine

## 2016-10-25 ENCOUNTER — Emergency Department (HOSPITAL_COMMUNITY): Payer: BLUE CROSS/BLUE SHIELD

## 2016-10-25 DIAGNOSIS — J45909 Unspecified asthma, uncomplicated: Secondary | ICD-10-CM | POA: Diagnosis not present

## 2016-10-25 DIAGNOSIS — Z79899 Other long term (current) drug therapy: Secondary | ICD-10-CM | POA: Insufficient documentation

## 2016-10-25 DIAGNOSIS — E1043 Type 1 diabetes mellitus with diabetic autonomic (poly)neuropathy: Secondary | ICD-10-CM | POA: Diagnosis not present

## 2016-10-25 DIAGNOSIS — J069 Acute upper respiratory infection, unspecified: Secondary | ICD-10-CM | POA: Insufficient documentation

## 2016-10-25 DIAGNOSIS — I10 Essential (primary) hypertension: Secondary | ICD-10-CM | POA: Insufficient documentation

## 2016-10-25 DIAGNOSIS — R0602 Shortness of breath: Secondary | ICD-10-CM | POA: Diagnosis present

## 2016-10-25 DIAGNOSIS — B9789 Other viral agents as the cause of diseases classified elsewhere: Secondary | ICD-10-CM

## 2016-10-25 DIAGNOSIS — E10319 Type 1 diabetes mellitus with unspecified diabetic retinopathy without macular edema: Secondary | ICD-10-CM | POA: Diagnosis not present

## 2016-10-25 MED ORDER — ALBUTEROL SULFATE (2.5 MG/3ML) 0.083% IN NEBU
INHALATION_SOLUTION | RESPIRATORY_TRACT | Status: AC
  Filled 2016-10-25: qty 6

## 2016-10-25 MED ORDER — ALBUTEROL SULFATE (2.5 MG/3ML) 0.083% IN NEBU
5.0000 mg | INHALATION_SOLUTION | Freq: Once | RESPIRATORY_TRACT | Status: AC
  Administered 2016-10-25: 5 mg via RESPIRATORY_TRACT

## 2016-10-25 NOTE — ED Triage Notes (Signed)
Pt states that she was seen the other day at [facility] and diagnosed with strep and bronchitis. Hx of asthma, pt has been on ABT, cough and SOB has got worse, non productive cough, pt spoke with PCP and told to come back here for chest xray.

## 2016-10-25 NOTE — ED Provider Notes (Signed)
[facility] DEPT Provider Note   CSN: [id number] Arrival date & time: 10/25/16  2043     History   Chief Complaint Chief Complaint  Patient presents with  . Shortness of Breath    HPI [name] is a 31 y.o. female.  HPI 31 year old female with a history of asthma and diabetes presenting with worsening cough and congestion since Sunday. She states that she was seen at urgent care on Sunday and diagnosed with strep and bronchitis and placed on azithromycin and prednisone. She states that her sore throat has gotten better however her cough and congestion has worsened. She denies any fevers but she does endorse using albuterol more frequently. She denies nausea vomiting or diarrhea. She has tried Delsym with minimal relief. She called her PCP and was told to come here for a CXR.  Past Medical History:  Diagnosis Date  . Anxiety   . Asthma   . Asthma 11/10/2013  . Depression   . Diabetic gastroparesis ([place])   . Diabetic neuropathy, type I diabetes mellitus ([place])   . Headache(784.0)   . Hypertension   . Kidney stones   . Polyneuropathy in diabetes(357.2)   . 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[place])   . Tachycardia    baseline tachycardia   . 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 DM retinopathy ([place])   . Encounter for preconception consultation   . MDD (major depressive disorder), severe ([place]) 05/31/2014  . Drug overdose, intentional ([place]) 03/27/2014  . Asthma 11/10/2013  . Colles' fracture of left radius 10/11/2013  . Type I diabetes mellitus with complication, uncontrolled ([place]) 10/11/2013  . MDD (major depressive disorder), recurrent episode, severe ([place]) 09/26/2013  . Generalized anxiety disorder 09/26/2013  . Hyperglycemia 04/30/2012  . Diabetic retinopathy associated with type 1 diabetes mellitus ([place]) 04/30/2012  . Polyneuropathy in diabetes(357.2)  04/30/2012  . H/O insertion of insulin pump 04/30/2012  . CHOLELITHIASIS 08/10/2010  . ATRIAL TACHYCARDIA 08/08/2010  . Gastroparesis 08/08/2010  . LIVER FUNCTION TESTS, ABNORMAL, HX OF 08/08/2010  . Type 1 diabetes mellitus with ketoacidosis, uncontrolled ([place]) 08/02/2010  . BOWEL INTUSSUSCEPTION 08/02/2010  . PANCREATITIS, ACUTE, HX OF 08/02/2010  . ABDOMINAL PAIN, LEFT UPPER QUADRANT 08/01/2010    Past Surgical History:  Procedure Laterality Date  . CESAREAN SECTION    . CHOLECYSTECTOMY    . REFRACTIVE SURGERY      OB History    Gravida Para Term Preterm AB Living   2 1       1    SAB TAB Ectopic Multiple Live Births           1       Home Medications    Prior to Admission medications   Medication Sig Start Date End Date Taking? Authorizing Provider  albuterol (PROVENTIL) (2.5 MG/3ML) 0.083% nebulizer solution Take 3 mLs (2.5 mg total) by nebulization every 4 (four) hours as needed for wheezing or shortness of breath. 10/22/16 04/17/18  Melanee Left, NP  azithromycin (ZITHROMAX) 250 MG tablet Take 1 tablet (250 mg total) by mouth daily. Take first 2 tablets together, then 1 every day until finished. 10/22/16   Melanee Left, NP  enalapril (VASOTEC) 10 MG tablet Take 10 mg by mouth daily.    Historical Provider, MD  Insulin Human (INSULIN PUMP) SOLN Inject 1 each into the skin 3 times daily with meals, bedtime and  2 AM. 38 units/day basal; bolus sliding scale    Historical Provider, MD  metoCLOPramide (REGLAN) 5 MG tablet Take 1 tablet (5 mg total) by mouth 3 (three) times daily before meals. 12/06/15   Nelida Meuse III, MD  mometasone-formoterol (DULERA) 100-5 MCG/ACT AERO Inhale 2 puffs into the lungs 2 (two) times daily.    Historical Provider, MD  montelukast (SINGULAIR) 10 MG tablet Take 1 tablet (10 mg total) by mouth at bedtime. 04/21/16   [name], MD  predniSONE (STERAPRED UNI-PAK 21 TAB) 10 MG (21) TBPK tablet Take 1 tablet (10 mg total) by mouth  daily. Take 6 tabs by mouth daily  for 2 days, then 5 tabs for 2 days, then 4 tabs for 2 days, then 3 tabs for 2 days, 2 tabs for 2 days, then 1 tab by mouth daily for 2 days 10/22/16   Melanee Left, NP  PROAIR HFA 108 [phone] Base) MCG/ACT inhaler INHALE 2 PUFFS INTO LUNGS EVERY 6 HOURS AS NEEDED FOR SHORTNESS OF BREATH 09/19/16   Orlena Sheldon, PA-C    Family History Family History  Problem Relation Age of Onset  . Hypertension      Social History Social History  Substance Use Topics  . Smoking status: Never Smoker  . Smokeless tobacco: Never Used  . Alcohol use No     Allergies   Ceftin   Review of Systems Review of Systems  Constitutional: Negative for chills and fever.  HENT: Positive for congestion and rhinorrhea. Negative for ear pain, sore throat and trouble swallowing.   Eyes: Negative for pain and visual disturbance.  Respiratory: Positive for cough and chest tightness. Negative for shortness of breath and wheezing.   Cardiovascular: Negative for chest pain and palpitations.  Gastrointestinal: Negative for abdominal pain, diarrhea, nausea and vomiting.  Genitourinary: Negative for dysuria and hematuria.  Musculoskeletal: Negative for arthralgias and back pain.  Skin: Negative for color change and rash.  Neurological: Negative for seizures and syncope.  All other systems reviewed and are negative.    Physical Exam Updated Vital Signs BP 143/89   Pulse 117   Temp 97.7 F (36.5 C) (Oral)   Resp 18   Ht 5\' 6"  (1.676 m)   Wt 99.8 kg   LMP 12/13/2015   SpO2 100%   BMI 35.51 kg/m   Physical Exam  Constitutional: She appears well-developed and well-nourished. No distress.  HENT:  Head: Normocephalic and atraumatic.  Mouth/Throat: Uvula is midline, oropharynx is clear and moist and mucous membranes are normal. Tonsils are 2+ on the right. Tonsils are 2+ on the left. No tonsillar exudate.  Eyes: Conjunctivae are normal.  Neck: Normal range of motion. Neck  supple.  Cardiovascular: Regular rhythm, S1 normal, S2 normal, normal heart sounds, intact distal pulses and normal pulses.  Tachycardia present.   No murmur heard. Pulmonary/Chest: Effort normal and breath sounds normal. No respiratory distress. She has no decreased breath sounds. She has no wheezes. She has no rhonchi.  Abdominal: Soft. She exhibits no distension. There is no tenderness.  Musculoskeletal: She exhibits no edema.  Neurological: She is alert.  Skin: Skin is warm and dry. Capillary refill takes less than 2 seconds.  Psychiatric: She has a normal mood and affect.  Nursing note and vitals reviewed.    ED Treatments / Results  Labs (all labs ordered are listed, but only abnormal results are displayed) Labs Reviewed - No data to display  EKG  EKG Interpretation  Date/Time:  Wednesday October 25 2016 20:57:09 EST Ventricular Rate:  110 PR Interval:  144 QRS Duration: 80 QT Interval:  348 QTC Calculation: 470 R Axis:   78 Text Interpretation:  Sinus tachycardia Possible Left atrial enlargement Borderline ECG Since last tracing rate faster Otherwise no significant change Confirmed by FLOYD MD, Quillian Quince [phone]) on 10/25/2016 9:25:43 PM       Radiology Dg Chest 2 View  Result Date: 10/25/2016 CLINICAL DATA:  Shortness of breath recent bronchitis EXAM: CHEST  2 VIEW COMPARISON:  10/13/2014 FINDINGS: The heart size and mediastinal contours are within normal limits. Both lungs are clear. The visualized skeletal structures are unremarkable. Surgical clips in the right upper quadrant. IMPRESSION: No active cardiopulmonary disease. Electronically Signed   By: Donavan Foil M.D.   On: 10/25/2016 21:12    Procedures Procedures (including critical care time)  Medications Ordered in ED Medications  albuterol (PROVENTIL) (2.5 MG/3ML) 0.083% nebulizer solution 5 mg (5 mg Nebulization Given 10/25/16 2055)     Initial Impression / Assessment and Plan / ED Course  I have reviewed  the triage vital signs and the nursing notes.  Pertinent labs & imaging results that were available during my care of the patient were reviewed by me and considered in my medical decision making (see chart for details).  Clinical Course    30yoF with cough and congestion. Is on azithro and pred from [facility]. Sore throat resolved, cough and congestion worsened. Got albuterol neb in triage. Lungs CTAB. Tachycardic, likely from albuterol. CXR without focal opacity or PTX. Pt likely with viral URI which is why she is not responding to antibiotics. No signs of PNA on CXR and no wheezing on exam. No oropharyngeal exudates. Pt instructed to f/u with PCP and finish course of abx and prednisone. Strict return precautions given.   Final Clinical Impressions(s) / ED Diagnoses   Final diagnoses:  SOB (shortness of breath)  Viral URI with cough    New Prescriptions Discharge Medication List as of 10/25/2016  9:51 PM       Lacey Dotson Mali Kiara Keep, MD 10/25/16 [place], DO 10/25/16 2211

## 2016-11-10 ENCOUNTER — Encounter: Payer: Self-pay | Admitting: Family Medicine

## 2016-11-10 ENCOUNTER — Ambulatory Visit (INDEPENDENT_AMBULATORY_CARE_PROVIDER_SITE_OTHER): Payer: Medicaid Other | Admitting: Family Medicine

## 2016-11-10 VITALS — BP 128/80 | HR 100 | Temp 98.3°F | Resp 18 | Ht 66.0 in | Wt 215.0 lb

## 2016-11-10 DIAGNOSIS — R6889 Other general symptoms and signs: Secondary | ICD-10-CM | POA: Diagnosis not present

## 2016-11-10 LAB — INFLUENZA A AND B AG, IMMUNOASSAY
Influenza A Antigen: DETECTED — AB
Influenza B Antigen: NOT DETECTED

## 2016-11-10 MED ORDER — ALBUTEROL SULFATE (2.5 MG/3ML) 0.083% IN NEBU: 2.5000 mg | INHALATION_SOLUTION | RESPIRATORY_TRACT | 1 refills | Status: DC | PRN

## 2016-11-10 MED ORDER — MOMETASONE FURO-FORMOTEROL FUM 100-5 MCG/ACT IN AERO: 2.0000 | INHALATION_SPRAY | Freq: Two times a day (BID) | RESPIRATORY_TRACT | 5 refills | Status: DC

## 2016-11-10 MED ORDER — OSELTAMIVIR PHOSPHATE 75 MG PO CAPS: 75.0000 mg | ORAL_CAPSULE | Freq: Two times a day (BID) | ORAL | 0 refills | Status: DC

## 2016-11-10 NOTE — Progress Notes (Signed)
Subjective:    Patient ID: [name], female    DOB: October 16, 1985, 31 y.o.   MRN: [id number]  HPI  Symptoms began 2 days ago with body aches, fever, nonproductive cough. Husband has had similar flulike symptoms. She denies any shortness of breath. She denies any chest pain. She denies any hemoptysis. Exam today is unremarkable. Patient does not appear toxic Past Medical History:  Diagnosis Date  . Anxiety   . Asthma   . Asthma 11/10/2013  . Depression   . Diabetic gastroparesis ([place])   . Diabetic neuropathy, type I diabetes mellitus ([place])   . Headache(784.0)   . Hypertension   . Kidney stones   . Polyneuropathy in diabetes(357.2)   . 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[place])   . Tachycardia    baseline tachycardia   . 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 CESAREAN SECTION    . CHOLECYSTECTOMY    . REFRACTIVE SURGERY     Current Outpatient Prescriptions on File Prior to Visit  Medication Sig Dispense Refill  . albuterol (PROVENTIL) (2.5 MG/3ML) 0.083% nebulizer solution Take 3 mLs (2.5 mg total) by nebulization every 4 (four) hours as needed for wheezing or shortness of breath. 30 vial 1  . enalapril (VASOTEC) 10 MG tablet Take 10 mg by mouth daily.    . Insulin Human (INSULIN PUMP) SOLN Inject 1 each into the skin 3 times daily with meals, bedtime and 2 AM. 38 units/day basal; bolus sliding scale    . metoCLOPramide (REGLAN) 5 MG tablet Take 1 tablet (5 mg total) by mouth 3 (three) times daily before meals. 90 tablet 1  . mometasone-formoterol (DULERA) 100-5 MCG/ACT AERO Inhale 2 puffs into the lungs 2 (two) times daily.    . montelukast (SINGULAIR) 10 MG tablet Take 1 tablet (10 mg total) by mouth at bedtime. 30 tablet 11  . PROAIR HFA 108 (90 Base) MCG/ACT inhaler INHALE 2 PUFFS INTO LUNGS EVERY 6 HOURS AS NEEDED FOR SHORTNESS OF BREATH 17 Inhaler 3   No current  facility-administered medications on file prior to visit.    Allergies  Allergen Reactions  . Ceftin Rash   Social History   Social History  . Marital status: Married    Spouse name: N/A  . Number of children: N/A  . Years of education: N/A   Occupational History  . Not on file.   Social History Main Topics  . Smoking status: Never Smoker  . Smokeless tobacco: Never Used  . Alcohol use No  . Drug use: No  . Sexual activity: Yes    Partners: Male   Other Topics Concern  . Not on file   Social History Narrative   Pt has a daughter and boyfriend.             Review of Systems  All other systems reviewed and are negative.      Objective:   Physical Exam  Constitutional: She appears well-developed and well-nourished. No distress.  HENT:  Right Ear: External ear normal.  Left Ear: External ear normal.  Nose: Nose normal.  Mouth/Throat: Oropharynx is clear and moist.  Neck: Neck supple.  Cardiovascular: Normal rate, regular rhythm and normal heart sounds.   Pulmonary/Chest: Effort normal and breath sounds normal. No respiratory distress. She has no wheezes. She has no rales.  Abdominal: Soft. Bowel sounds are normal. She exhibits no distension. There is  no tenderness. There is no rebound.  Lymphadenopathy:    She has no cervical adenopathy.  Skin: She is not diaphoretic.  Vitals reviewed.         Assessment & Plan:  Flu-like symptoms - Plan: Influenza A+B Ag, EIA  Flu test positive for type A.  Tamiflu 75 mg pobid for 5 days. I recommended tincture of time. Use Tylenol and ibuprofen for fever and body aches. Push fluids. Can use Robitussin-DM for cough. Symptoms should gradually improve over the next 5-7 days. Recheck if symptoms worsen

## 2016-11-16 ENCOUNTER — Telehealth: Payer: Self-pay | Admitting: Family Medicine

## 2016-11-16 ENCOUNTER — Encounter: Payer: Self-pay | Admitting: Physician Assistant

## 2016-11-16 ENCOUNTER — Ambulatory Visit (INDEPENDENT_AMBULATORY_CARE_PROVIDER_SITE_OTHER): Payer: BLUE CROSS/BLUE SHIELD | Admitting: Physician Assistant

## 2016-11-16 ENCOUNTER — Telehealth: Payer: Self-pay | Admitting: *Deleted

## 2016-11-16 VITALS — BP 158/89 | HR 103 | Temp 98.0°F | Resp 18 | Wt 216.6 lb

## 2016-11-16 DIAGNOSIS — E108 Type 1 diabetes mellitus with unspecified complications: Secondary | ICD-10-CM | POA: Diagnosis not present

## 2016-11-16 DIAGNOSIS — J111 Influenza due to unidentified influenza virus with other respiratory manifestations: Secondary | ICD-10-CM | POA: Diagnosis not present

## 2016-11-16 DIAGNOSIS — J988 Other specified respiratory disorders: Secondary | ICD-10-CM | POA: Diagnosis not present

## 2016-11-16 DIAGNOSIS — E1065 Type 1 diabetes mellitus with hyperglycemia: Secondary | ICD-10-CM

## 2016-11-16 DIAGNOSIS — N393 Stress incontinence (female) (male): Secondary | ICD-10-CM

## 2016-11-16 DIAGNOSIS — B9689 Other specified bacterial agents as the cause of diseases classified elsewhere: Secondary | ICD-10-CM

## 2016-11-16 DIAGNOSIS — IMO0002 Reserved for concepts with insufficient information to code with codable children: Secondary | ICD-10-CM

## 2016-11-16 MED ORDER — SOLIFENACIN SUCCINATE 5 MG PO TABS: 5.0000 mg | ORAL_TABLET | Freq: Every day | ORAL | 3 refills | Status: DC

## 2016-11-16 MED ORDER — METHYLPREDNISOLONE ACETATE 40 MG/ML IJ SUSP
60.0000 mg | Freq: Once | INTRAMUSCULAR | Status: AC
  Administered 2016-11-16: 60 mg via INTRAMUSCULAR

## 2016-11-16 MED ORDER — AZITHROMYCIN 250 MG PO TABS: ORAL_TABLET | ORAL | 0 refills | Status: DC

## 2016-11-16 NOTE — Telephone Encounter (Signed)
Your information has been submitted to [employer] [place]. [employer] [place] will review the request and fax you a determination directly, typically within 3 business days of your submission once all necessary information is received.  If [employer] [place] has not responded in 3 business days or if you have any questions about your submission, contact [employer] [place] at [phone]. 

## 2016-11-16 NOTE — Progress Notes (Signed)
Patient ID: [name] MRN: [id number], DOB: [birth year], 31 y.o. Date of Encounter: 11/16/2016, 1:52 PM    Chief Complaint:  Chief Complaint  Patient presents with  . Chest Pain    when taking deep breath  . chest congestion  . cold sweats  . Shortness of Breath  . Wheezing     HPI: 31 y.o. year old female presetns with above.   Reviewed her office note from her office visit here 11/10/16. At that time history of present illness reported "this began 2 days ago with body aches, fever, nonproductive cough. Husband has had similar flulike symptoms. Denies any shortness of breath. Denies any chest pain. Denies any hemoptysis. Exam today unremarkable. Does not appear toxic.  exam showed lungs to be clear." Test was positive for Influenza A. Prescribed Tamiflu 75 mg twice a day for 5 days., Ibuprofen for fever and body aches. Push fluids. Robitussin prn Cough. Symptoms should gradually improve over the next 5-7 days. Recheck if symptoms worsen."   Today patient states that she has been taking the Tamiflu as directed. Has been checking temperature and has been getting normal readings yet she feels sweaty. Says that she feels pain in her chest both with inspiration and expiration. Says that she has been wheezing. Says that she has been using Mucinex. Still, the mucus is very thick so it is hard for her to get it out --  but with a little bit she has gotten out, has been green. Has been using her nebulizer and inhaler. Says that she had some prednisone left from a prior prescription so she started taking that 2 days ago. Says that she started it with prednisone 40 mg daily for 2 days then tapers down to 30 mg for 2 days and so forth. Currently is at the 30 mg Says 2 nights ago she had to use the nebulizer 3 or 4 times and felt that she wasn't getting much relief. Even on prednisone is still wheezing. Says it is worse at night.  She also states that has chronic problem that whenever  she coughs or sneezes she has urinary incontinence. "This current cough is driving her crazy" because she is constantly losing urine. Wearing pad nonstop which is extremely irritating. Has a baby ( < 42 year old). She states that she did go for her OB/GYN visit and had pelvic exam and they made no mention of any prolapse.  Home Meds:   Outpatient Medications Prior to Visit  Medication Sig Dispense Refill  . albuterol (PROVENTIL) (2.5 MG/3ML) 0.083% nebulizer solution Take 3 mLs (2.5 mg total) by nebulization every 4 (four) hours as needed for wheezing or shortness of breath. 30 vial 1  . enalapril (VASOTEC) 10 MG tablet Take 10 mg by mouth daily.    . Insulin Human (INSULIN PUMP) SOLN Inject 1 each into the skin 3 times daily with meals, bedtime and 2 AM. 38 units/day basal; bolus sliding scale    . metoCLOPramide (REGLAN) 5 MG tablet Take 1 tablet (5 mg total) by mouth 3 (three) times daily before meals. 90 tablet 1  . mometasone-formoterol (DULERA) 100-5 MCG/ACT AERO Inhale 2 puffs into the lungs 2 (two) times daily. 1 Inhaler 5  . montelukast (SINGULAIR) 10 MG tablet Take 1 tablet (10 mg total) by mouth at bedtime. 30 tablet 11  . PROAIR HFA 108 (90 Base) MCG/ACT inhaler INHALE 2 PUFFS INTO LUNGS EVERY 6 HOURS AS NEEDED FOR SHORTNESS OF BREATH 17 Inhaler 3  .  oseltamivir (TAMIFLU) 75 MG capsule Take 1 capsule (75 mg total) by mouth 2 (two) times daily. (Patient not taking: Reported on 11/16/2016) 10 capsule 0   No facility-administered medications prior to visit.     Allergies:  Allergies  Allergen Reactions  . Ceftin Rash      Review of Systems: See HPI for pertinent ROS. All other ROS negative.    Physical Exam: Blood pressure (!) 158/89, pulse (!) 103, temperature 98 F (36.7 C), temperature source Oral, resp. rate 18, weight 216 lb 9.6 oz (98.2 kg), last menstrual period 11/08/2016, SpO2 99 %., Body mass index is 34.96 kg/m. General:  Obese WF. Appears in no acute  distress. HEENT: Normocephalic, atraumatic, eyes without discharge, sclera non-icteric, nares are without discharge. Bilateral auditory canals clear, TM's are without perforation, pearly grey and translucent with reflective cone of light bilaterally. Oral cavity moist, posterior pharynx without exudate, erythema, peritonsillar abscess.  Neck: Supple. No thyromegaly. No lymphadenopathy. Lungs: Every single time she takes a deep breath she starts coughing. What I can hear, lungs sound clear. I do not hear wheezing. Heart: Regular rhythm. No murmurs, rubs, or gallops. Msk:  Strength and tone normal for age. Extremities/Skin: Warm and dry. Neuro: Alert and oriented X 3. Moves all extremities spontaneously. Gait is normal. CNII-XII grossly in tact. Psych:  Responds to questions appropriately with a normal affect.     ASSESSMENT AND PLAN:  31 y.o. year old female with  1. Influenza She had positive influenza test 11/10/16 and was treated with Tamiflu at that time.  2. Bacterial respiratory infection Given her reported wheezing despite use of oral prednisone and albuterol, will give Depo-Medrol 60 mg now. She has insulin pump. Reminded her that this Depo-Medrol will make her blood sugar go up and make sure that her insulin is adjusted accordingly. Add antibiotic to cover for possible secondary bacterial infection. Will have her schedule follow-up visit with Dr. Dennard Schaumann for tomorrow to reevaluate prior to going into the weekend. - azithromycin (ZITHROMAX) 250 MG tablet; Day 1: Take 2 daily. Days 2 -5: Take 1 daily.  Dispense: 6 tablet; Refill: 0 - methylPREDNISolone acetate (DEPO-MEDROL) injection 60 mg; Inject 1.5 mLs (60 mg total) into the muscle once.  3. Type I diabetes mellitus with complication, uncontrolled ([place])  4. Stress incontinence Had pelvic exam by GYN. Will add Vesicare to see if this controls her symptoms. - solifenacin (VESICARE) 5 MG tablet; Take 1 tablet (5 mg total) by mouth  daily.  Dispense: 30 tablet; Refill: 3   Signed, [address] [place], [place], [facility] 11/16/2016 1:52 PM

## 2016-11-16 NOTE — Telephone Encounter (Signed)
Patient is coming in to see MB today.

## 2016-11-16 NOTE — Telephone Encounter (Signed)
Patient states she was seen last week and was dx with the flu. She now has more congestion and her chest is starting to hurt and she has a history of asthma. She would like to know if an antibiotic can be called into [facility] on [address].  CB# [phone]

## 2016-11-16 NOTE — Telephone Encounter (Signed)
Received request from pharmacy for PA on [address].   PA submitted.   Dx: N39.3- stress incontinence.

## 2016-11-17 ENCOUNTER — Encounter: Payer: Self-pay | Admitting: Family Medicine

## 2016-11-17 ENCOUNTER — Ambulatory Visit (INDEPENDENT_AMBULATORY_CARE_PROVIDER_SITE_OTHER): Payer: BLUE CROSS/BLUE SHIELD | Admitting: Family Medicine

## 2016-11-17 VITALS — BP 178/100 | HR 108 | Temp 98.0°F | Resp 18 | Ht 66.0 in | Wt 216.0 lb

## 2016-11-17 DIAGNOSIS — J4531 Mild persistent asthma with (acute) exacerbation: Secondary | ICD-10-CM

## 2016-11-17 MED ORDER — PREDNISONE 20 MG PO TABS
60.0000 mg | ORAL_TABLET | Freq: Every day | ORAL | 0 refills | Status: DC
Start: 2016-11-17 — End: 2016-12-19

## 2016-11-17 MED ORDER — ALBUTEROL SULFATE HFA 108 (90 BASE) MCG/ACT IN AERS: 2.0000 | INHALATION_SPRAY | Freq: Four times a day (QID) | RESPIRATORY_TRACT | 0 refills | Status: DC | PRN

## 2016-11-17 NOTE — Progress Notes (Signed)
Subjective:    Patient ID: [name], female    DOB: 03-May-1986, 31 y.o.   MRN: [id number]  HPI  11/10/16 Symptoms began 2 days ago with body aches, fever, nonproductive cough. Husband has had similar flulike symptoms. She denies any shortness of breath. She denies any chest pain. She denies any hemoptysis. Exam today is unremarkable. Patient does not appear toxic.  At that time, my plan was: Flu test positive for type A.  Tamiflu 75 mg pobid for 5 days. I recommended tincture of time. Use Tylenol and ibuprofen for fever and body aches. Push fluids. Can use Robitussin-DM for cough. Symptoms should gradually improve over the next 5-7 days. Recheck if symptoms worsen  11/17/16 Patient saw my partner on Wednesday was started on a Z-Pak. She is not running any fevers but she is extremely short of breath. She has very little air movement. She is audibly wheezing in all 4 lung fields. There is no respiratory distress. There is no increased work of breathing but it is obvious the patient having an asthma exacerbation triggered by her recent flu illness. Past Medical History:  Diagnosis Date  . Anxiety   . Asthma   . Asthma 11/10/2013  . Depression   . Diabetic gastroparesis ([place])   . Diabetic neuropathy, type I diabetes mellitus ([place])   . Headache(784.0)   . Hypertension   . Kidney stones   . Polyneuropathy in diabetes(357.2)   . 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[place])   . Tachycardia    baseline tachycardia   . 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 CESAREAN SECTION    . CHOLECYSTECTOMY    . REFRACTIVE SURGERY     Current Outpatient Prescriptions on File Prior to Visit  Medication Sig Dispense Refill  . albuterol (PROVENTIL) (2.5 MG/3ML) 0.083% nebulizer solution Take 3 mLs (2.5 mg total) by nebulization every 4 (four) hours as needed for wheezing or shortness of breath. 30 vial 1  .  azithromycin (ZITHROMAX) 250 MG tablet Day 1: Take 2 daily. Days 2 -5: Take 1 daily. 6 tablet 0  . enalapril (VASOTEC) 10 MG tablet Take 10 mg by mouth daily.    . Insulin Human (INSULIN PUMP) SOLN Inject 1 each into the skin 3 times daily with meals, bedtime and 2 AM. 38 units/day basal; bolus sliding scale    . metoCLOPramide (REGLAN) 5 MG tablet Take 1 tablet (5 mg total) by mouth 3 (three) times daily before meals. 90 tablet 1  . mometasone-formoterol (DULERA) 100-5 MCG/ACT AERO Inhale 2 puffs into the lungs 2 (two) times daily. 1 Inhaler 5  . montelukast (SINGULAIR) 10 MG tablet Take 1 tablet (10 mg total) by mouth at bedtime. 30 tablet 11  . PROAIR HFA 108 (90 Base) MCG/ACT inhaler INHALE 2 PUFFS INTO LUNGS EVERY 6 HOURS AS NEEDED FOR SHORTNESS OF BREATH 17 Inhaler 3  . solifenacin (VESICARE) 5 MG tablet Take 1 tablet (5 mg total) by mouth daily. 30 tablet 3   No current facility-administered medications on file prior to visit.    Allergies  Allergen Reactions  . Ceftin Rash   Social History   Social History  . Marital status: Married    Spouse name: N/A  . Number of children: N/A  . Years of education: N/A   Occupational History  . Not on file.   Social History Main Topics  . Smoking  status: Never Smoker  . Smokeless tobacco: Never Used  . Alcohol use No  . Drug use: No  . Sexual activity: Yes    Partners: Male   Other Topics Concern  . Not on file   Social History Narrative   Pt has a daughter and boyfriend.             Review of Systems  All other systems reviewed and are negative.      Objective:   Physical Exam  Constitutional: She appears well-developed and well-nourished. No distress.  HENT:  Right Ear: External ear normal.  Left Ear: External ear normal.  Nose: Nose normal.  Mouth/Throat: Oropharynx is clear and moist.  Neck: Neck supple.  Cardiovascular: Normal rate, regular rhythm and normal heart sounds.   Pulmonary/Chest: Effort normal. No  respiratory distress. She has wheezes. She has no rales.  Abdominal: Soft. Bowel sounds are normal. She exhibits no distension. There is no tenderness. There is no rebound.  Lymphadenopathy:    She has no cervical adenopathy.  Skin: She is not diaphoretic.  Vitals reviewed.         Assessment & Plan:  Asthma exacerbation  Begin prednisone 60 mg a day for 5 days straight and recheck here on Monday. Low pressures elevated however the patient is anxious and having increased work of breathing. Hopefully this will improve as her breathing improves. Use albuterol 2-3 puffs every 4-6 hours as needed. Seek medical attention immediately over the weekend should breathing worsen

## 2016-11-18 IMAGING — DX DG FOOT COMPLETE 3+V*L*
3 series · 3 of 3 positions shown · non-contrast
Comparison: None.

CLINICAL DATA: Left foot pain for 1 week.  No known injury.

EXAM:
LEFT FOOT - COMPLETE 3+ VIEW

[foot ap]
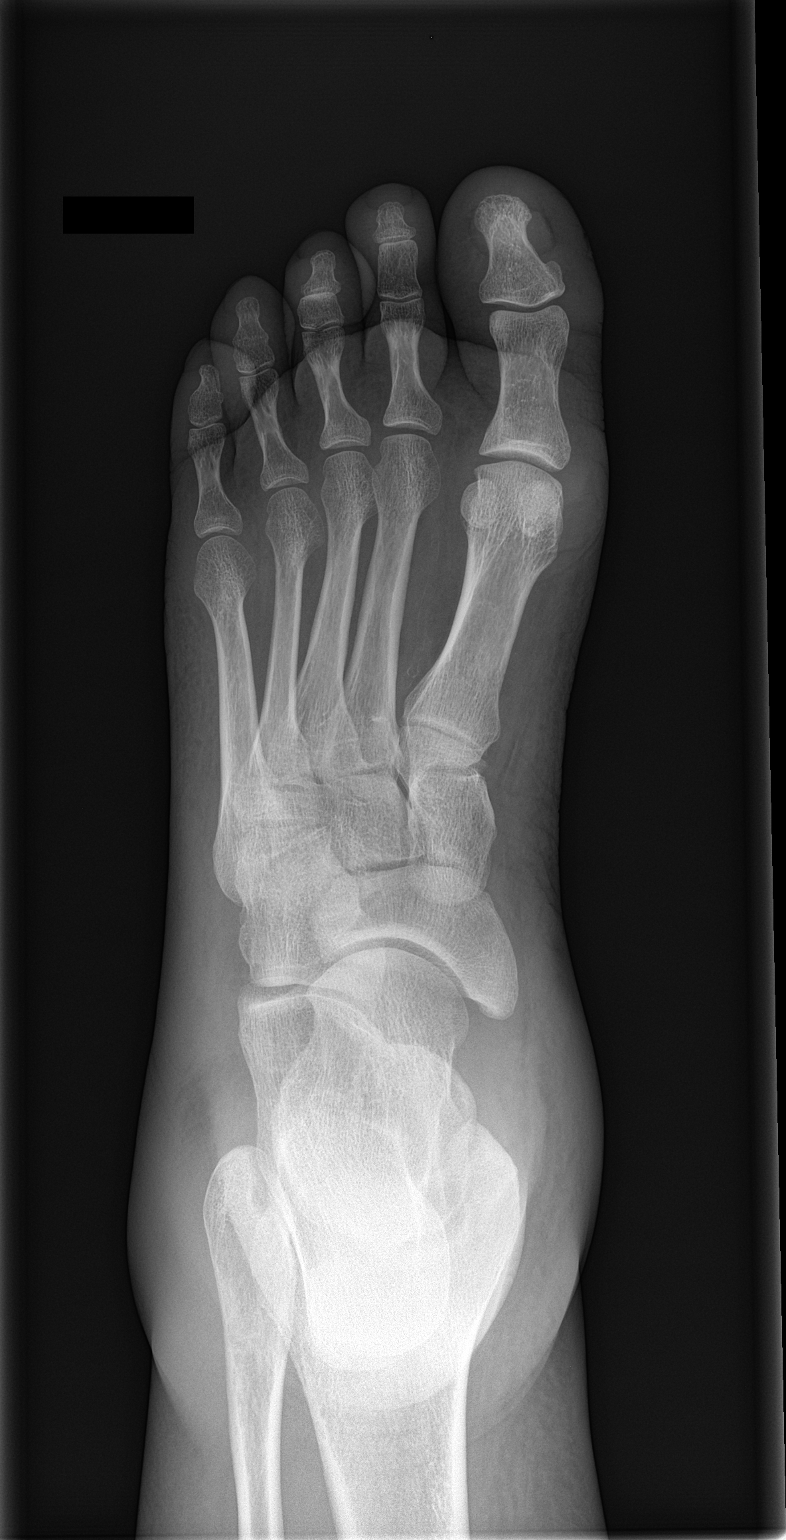

[foot obl]
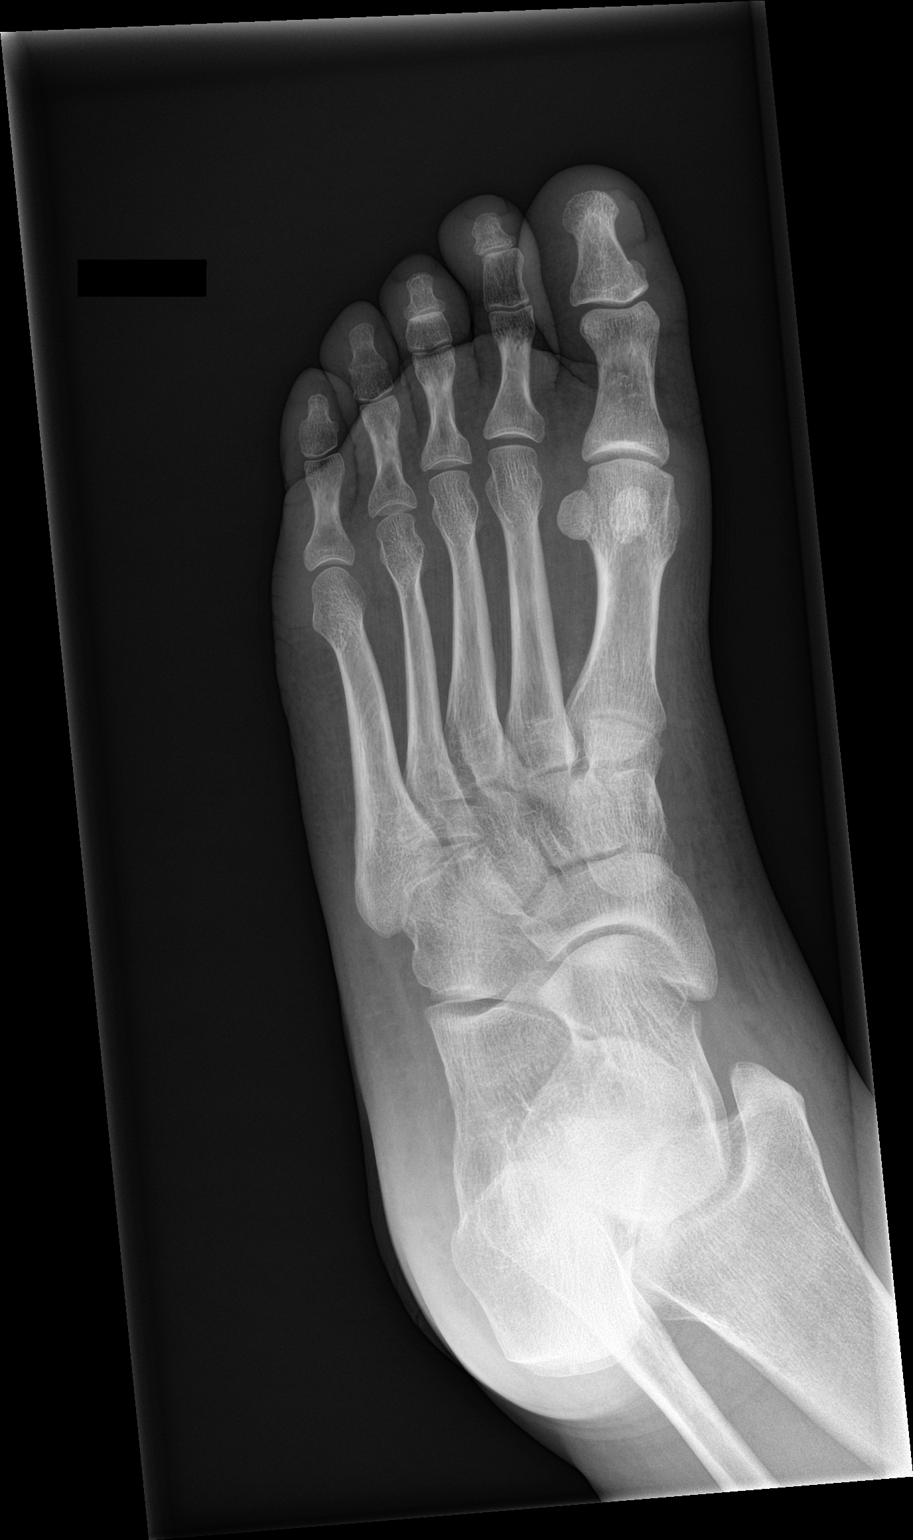

[foot lat]
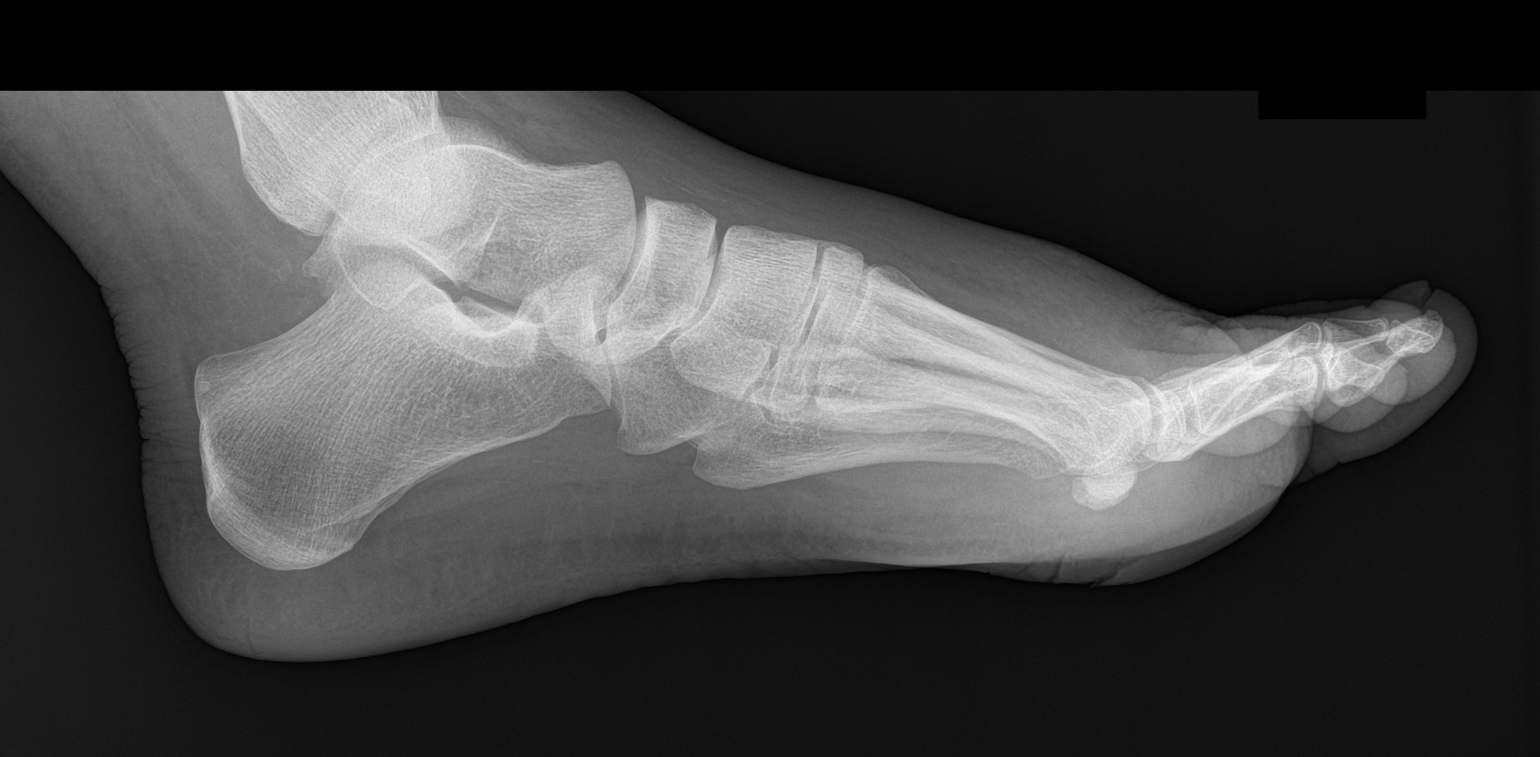

[3 of 3 positions shown; findings below may reference images not displayed]

FINDINGS: Soft tissue swelling along the dorsum of the foot over the
metatarsals. No acute bony abnormality. Specifically, no fracture,
subluxation, or dislocation. Soft tissues are intact.
IMPRESSION: No acute bony abnormality.

## 2016-11-20 ENCOUNTER — Telehealth: Payer: Self-pay | Admitting: Family Medicine

## 2016-11-20 MED ORDER — HYDROCODONE-HOMATROPINE 5-1.5 MG/5ML PO SYRP: 5.0000 mL | ORAL_SOLUTION | Freq: Four times a day (QID) | ORAL | 0 refills | Status: DC | PRN

## 2016-11-20 NOTE — Telephone Encounter (Signed)
Hycodan 1 tsp poq 6 hrs prn cough 4 oz.

## 2016-11-20 NOTE — Telephone Encounter (Signed)
RX printed, left up front and patient aware to pick up via vm 

## 2016-11-20 NOTE — Telephone Encounter (Signed)
Pt called and states that she is having some left side pain and she thinks it is coming from her coughing so much and would like something called in for pain and her cough.

## 2016-11-22 ENCOUNTER — Ambulatory Visit
Admission: RE | Admit: 2016-11-22 | Discharge: 2016-11-22 | Disposition: A | Discharge status: Home / Self Care | Payer: BLUE CROSS/BLUE SHIELD | Source: Ambulatory Visit | Attending: Physician Assistant | Admitting: Physician Assistant

## 2016-11-22 ENCOUNTER — Telehealth: Payer: Self-pay | Admitting: Family Medicine

## 2016-11-22 ENCOUNTER — Ambulatory Visit (INDEPENDENT_AMBULATORY_CARE_PROVIDER_SITE_OTHER): Payer: BLUE CROSS/BLUE SHIELD | Admitting: Physician Assistant

## 2016-11-22 ENCOUNTER — Encounter: Payer: Self-pay | Admitting: Physician Assistant

## 2016-11-22 VITALS — BP 170/114 | HR 102 | Temp 97.9°F | Resp 18 | Wt 230.2 lb

## 2016-11-22 DIAGNOSIS — R05 Cough: Secondary | ICD-10-CM

## 2016-11-22 DIAGNOSIS — J111 Influenza due to unidentified influenza virus with other respiratory manifestations: Secondary | ICD-10-CM

## 2016-11-22 DIAGNOSIS — J4521 Mild intermittent asthma with (acute) exacerbation: Secondary | ICD-10-CM

## 2016-11-22 DIAGNOSIS — R091 Pleurisy: Secondary | ICD-10-CM | POA: Diagnosis not present

## 2016-11-22 DIAGNOSIS — R053 Chronic cough: Secondary | ICD-10-CM

## 2016-11-22 MED ORDER — PREDNISONE 20 MG PO TABS: ORAL_TABLET | ORAL | 0 refills | Status: DC

## 2016-11-22 MED ORDER — HYDROCODONE-ACETAMINOPHEN 5-325 MG PO TABS: 1.0000 | ORAL_TABLET | Freq: Four times a day (QID) | ORAL | 0 refills | Status: DC | PRN

## 2016-11-22 MED ORDER — HYDROCODONE-HOMATROPINE 5-1.5 MG/5ML PO SYRP: 5.0000 mL | ORAL_SOLUTION | Freq: Four times a day (QID) | ORAL | 0 refills | Status: DC | PRN

## 2016-11-22 NOTE — Telephone Encounter (Signed)
Called and spoke to pt and she states that she is hurting really bad in her left lung area with breathing and it is worse with coughing and she was concerned about pleurisy as she has had this in the past. Recommended appt and cxr. Appt made and cxr ordered.

## 2016-11-22 NOTE — Telephone Encounter (Signed)
pts symptoms have worsened wants to know if she should come in or what she should do.

## 2016-11-23 NOTE — Progress Notes (Signed)
Patient ID: [name] MRN: [id number], DOB: [birth year], 31 y.o. Date of Encounter: 11/23/2016, 10:51 AM    Chief Complaint:  Chief Complaint  Patient presents with  . left chest pain    x 4days     HPI: 31 y.o. year old female presents with above.    THE FOLLOWING IS COPIED FROM MY OV NOTE 11/16/2016: Reviewed her office note from her office visit here 11/10/16. At that time history of present illness reported "this began 2 days ago with body aches, fever, nonproductive cough. Husband has had similar flulike symptoms. Denies any shortness of breath. Denies any chest pain. Denies any hemoptysis. Exam today unremarkable. Does not appear toxic.  exam showed lungs to be clear." Test was positive for Influenza A. Prescribed Tamiflu 75 mg twice a day for 5 days., Ibuprofen for fever and body aches. Push fluids. Robitussin prn Cough. Symptoms should gradually improve over the next 5-7 days. Recheck if symptoms worsen."   Today patient states that she has been taking the Tamiflu as directed. Has been checking temperature and has been getting normal readings yet she feels sweaty. Says that she feels pain in her chest both with inspiration and expiration. Says that she has been wheezing. Says that she has been using Mucinex. Still, the mucus is very thick so it is hard for her to get it out --  but with a little bit she has gotten out, has been green. Has been using her nebulizer and inhaler. Says that she had some prednisone left from a prior prescription so she started taking that 2 days ago. Says that she started it with prednisone 40 mg daily for 2 days then tapers down to 30 mg for 2 days and so forth. Currently is at the 30 mg Says 2 nights ago she had to use the nebulizer 3 or 4 times and felt that she wasn't getting much relief. Even on prednisone is still wheezing. Says it is worse at night.  She also states that has chronic problem that whenever she coughs or sneezes she has  urinary incontinence. "This current cough is driving her crazy" because she is constantly losing urine. Wearing pad nonstop which is extremely irritating. Has a baby ( < 61 year old). She states that she did go for her OB/GYN visit and had pelvic exam and they made no mention of any prolapse.   1. Influenza She had positive influenza test 11/10/16 and was treated with Tamiflu at that time.  2. Bacterial respiratory infection Given her reported wheezing despite use of oral prednisone and albuterol, will give Depo-Medrol 60 mg now. She has insulin pump. Reminded her that this Depo-Medrol will make her blood sugar go up and make sure that her insulin is adjusted accordingly. Add antibiotic to cover for possible secondary bacterial infection. Will have her schedule follow-up visit with Dr. Dennard Schaumann for tomorrow to reevaluate prior to going into the weekend. - azithromycin (ZITHROMAX) 250 MG tablet; Day 1: Take 2 daily. Days 2 -5: Take 1 daily.  Dispense: 6 tablet; Refill: 0 - methylPREDNISolone acetate (DEPO-MEDROL) injection 60 mg; Inject 1.5 mLs (60 mg total) into the muscle once.  3. Type I diabetes mellitus with complication, uncontrolled ([place])  4. Stress incontinence Had pelvic exam by GYN. Will add Vesicare to see if this controls her symptoms. - solifenacin (VESICARE) 5 MG tablet; Take 1 tablet (5 mg total) by mouth daily.  Dispense: 30 tablet; Refill: 3    THE FOLLOWING IS  COPIED FROM HER OV WITH DR. PICKARD 11/17/2016:  11/17/16 Patient saw my partner on Wednesday was started on a Z-Pak. She is not running any fevers but she is extremely short of breath. She has very little air movement. She is audibly wheezing in all 4 lung fields. There is no respiratory distress. There is no increased work of breathing but it is obvious the patient having an asthma exacerbation triggered by her recent flu illness.  Asthma exacerbation Begin prednisone 60 mg a day for 5 days straight and recheck here on  Monday. Low pressures elevated however the patient is anxious and having increased work of breathing. Hopefully this will improve as her breathing improves. Use albuterol 2-3 puffs every 4-6 hours as needed. Seek medical attention immediately over the weekend should breathing worsen  SHE HAD CHEST XRAY EARLIER TODAY---11/22/2016: That report shows: FINDINGS: Lungs are clear. Heart size and pulmonary vascularity are normal. No adenopathy. No pneumothorax. No bone lesions.  IMPRESSION: No edema or consolidation.  On: 11/22/2016 12:42      11/22/2016: She reports that 4 days ago she started having pain on the left side of her back / her lungs. Says that she is trying not to cough because has increased pain when she coughs. It is really affecting her sleep because it hurts when she lays on her right side but also hurts when she lays on her left side. Cannot take NSAIDs or tramadol secondary to her kidneys. Therefore, has been taking Tylenol. Had a couple of Percocet left from her C-section and has used those. She completed the prednisone yesterday. Says that she still feels like she is wheezing at times. Says it is difficult to use the albuterol because it is she has pain when she takes a deep breath.       Home Meds:   Outpatient Medications Prior to Visit  Medication Sig Dispense Refill  . albuterol (PROVENTIL HFA;VENTOLIN HFA) 108 (90 Base) MCG/ACT inhaler Inhale 2 puffs into the lungs every 6 (six) hours as needed for wheezing or shortness of breath. 2 Inhaler 0  . albuterol (PROVENTIL) (2.5 MG/3ML) 0.083% nebulizer solution Take 3 mLs (2.5 mg total) by nebulization every 4 (four) hours as needed for wheezing or shortness of breath. 30 vial 1  . enalapril (VASOTEC) 10 MG tablet Take 10 mg by mouth daily.    . Insulin Human (INSULIN PUMP) SOLN Inject 1 each into the skin 3 times daily with meals, bedtime and 2 AM. 38 units/day basal; bolus sliding scale    . metoCLOPramide (REGLAN) 5  MG tablet Take 1 tablet (5 mg total) by mouth 3 (three) times daily before meals. 90 tablet 1  . mometasone-formoterol (DULERA) 100-5 MCG/ACT AERO Inhale 2 puffs into the lungs 2 (two) times daily. 1 Inhaler 5  . montelukast (SINGULAIR) 10 MG tablet Take 1 tablet (10 mg total) by mouth at bedtime. 30 tablet 11  . PROAIR HFA 108 (90 Base) MCG/ACT inhaler INHALE 2 PUFFS INTO LUNGS EVERY 6 HOURS AS NEEDED FOR SHORTNESS OF BREATH 17 Inhaler 3  . solifenacin (VESICARE) 5 MG tablet Take 1 tablet (5 mg total) by mouth daily. 30 tablet 3  . HYDROcodone-homatropine (HYCODAN) 5-1.5 MG/5ML syrup Take 5 mLs by mouth every 6 (six) hours as needed for cough. 120 mL 0  . azithromycin (ZITHROMAX) 250 MG tablet Day 1: Take 2 daily. Days 2 -5: Take 1 daily. (Patient not taking: Reported on 11/22/2016) 6 tablet 0  . predniSONE (DELTASONE) 20 MG  tablet Take 3 tablets (60 mg total) by mouth daily with breakfast. (Patient not taking: Reported on 11/22/2016) 15 tablet 0   No facility-administered medications prior to visit.     Allergies:  Allergies  Allergen Reactions  . Ceftin Rash      Review of Systems: See HPI for pertinent ROS. All other ROS negative.    Physical Exam: Blood pressure (!) 152/94, pulse (!) 102, temperature 97.9 F (36.6 C), temperature source Oral, resp. rate 18, weight 230 lb 3.2 oz (104.4 kg), last menstrual period 11/08/2016, SpO2 99 %., Body mass index is 37.16 kg/m. General:  Obese WF. Appears in no acute distress. HEENT: Normocephalic, atraumatic, eyes without discharge, sclera non-icteric, nares are without discharge. Bilateral auditory canals clear, TM's are without perforation, pearly grey and translucent with reflective cone of light bilaterally. Oral cavity moist, posterior pharynx without exudate, erythema, peritonsillar abscess.  Neck: Supple. No thyromegaly. No lymphadenopathy. Lungs: Left low base--some rhonchi/wheeze. Remainder of lungs with very faint wheezes,  scattered. Heart: Regular rhythm. No murmurs, rubs, or gallops. Msk:  Strength and tone normal for age. Extremities/Skin: Warm and dry. Neuro: Alert and oriented X 3. Moves all extremities spontaneously. Gait is normal. CNII-XII grossly in tact. Psych:  Responds to questions appropriately with a normal affect.     ASSESSMENT AND PLAN:  31 y.o. year old female with   1. Influenza 2. Mild intermittent asthma with exacerbation - predniSONE (DELTASONE) 20 MG tablet; Take 2 daily for 5 days  Dispense: 10 tablet; Refill: 0  3. Pleurisy - HYDROcodone-homatropine (HYCODAN) 5-1.5 MG/5ML syrup; Take 5 mLs by mouth every 6 (six) hours as needed for cough.  Dispense: 120 mL; Refill: 0 - HYDROcodone-acetaminophen (NORCO/VICODIN) 5-325 MG tablet; Take 1 tablet by mouth every 6 (six) hours as needed.  Dispense: 30 tablet; Refill: 0  At this point -- will have her take prednisone 40 mg daily for 5 days. Will give her Hycodan syrup to help suppress cough as she has pain with cough. Will also give her hydrocodone to use for pain relief. She will monitor symptoms and will schedule follow-up visit if worsens. Otherwise will have her follow up Monday.   Signed, [address] [place], [place], [facility] 11/23/2016 10:51 AM

## 2016-12-07 ENCOUNTER — Telehealth: Payer: Self-pay

## 2016-12-07 MED ORDER — TOLTERODINE TARTRATE ER 2 MG PO CP24: 2.0000 mg | ORAL_CAPSULE | Freq: Every day | ORAL | 11 refills | Status: DC

## 2016-12-07 NOTE — Telephone Encounter (Signed)
I received a fax from the insurance company indicating that [place]  was denied and that an alternative was needed. The Rx will be changed to [place] ER  per M.Doren Custard which is an option  on the formulary list sent by the insurance.   Patient is aware of the change

## 2016-12-12 ENCOUNTER — Telehealth: Payer: Self-pay | Admitting: Gastroenterology

## 2016-12-12 DIAGNOSIS — K3184 Gastroparesis: Principal | ICD-10-CM

## 2016-12-12 DIAGNOSIS — E1143 Type 2 diabetes mellitus with diabetic autonomic (poly)neuropathy: Secondary | ICD-10-CM

## 2016-12-13 MED ORDER — METOCLOPRAMIDE HCL 5 MG PO TABS: 5.0000 mg | ORAL_TABLET | Freq: Three times a day (TID) | ORAL | 0 refills | Status: DC

## 2016-12-13 NOTE — Telephone Encounter (Signed)
Refilled as directed by Dr Danis  

## 2016-12-13 NOTE — Telephone Encounter (Signed)
Refill request for Reglan 5 mg TID. Pt has an appointment 12-29-2016. Please advise.

## 2016-12-13 NOTE — Telephone Encounter (Signed)
OK for one month supply, no refill

## 2016-12-17 ENCOUNTER — Ambulatory Visit (HOSPITAL_COMMUNITY)
Admission: EM | Admit: 2016-12-17 | Discharge: 2016-12-17 | Disposition: A | Discharge status: Home / Self Care | Payer: BLUE CROSS/BLUE SHIELD | Source: Home / Self Care

## 2016-12-17 ENCOUNTER — Encounter (HOSPITAL_COMMUNITY): Payer: Self-pay

## 2016-12-17 ENCOUNTER — Emergency Department (HOSPITAL_COMMUNITY)
Admission: EM | Admit: 2016-12-17 | Discharge: 2016-12-17 | Disposition: A | Discharge status: Home / Self Care | Payer: BLUE CROSS/BLUE SHIELD | Attending: Emergency Medicine | Admitting: Emergency Medicine

## 2016-12-17 ENCOUNTER — Encounter (HOSPITAL_COMMUNITY): Payer: Self-pay | Admitting: *Deleted

## 2016-12-17 ENCOUNTER — Emergency Department (HOSPITAL_COMMUNITY): Payer: BLUE CROSS/BLUE SHIELD

## 2016-12-17 DIAGNOSIS — L089 Local infection of the skin and subcutaneous tissue, unspecified: Secondary | ICD-10-CM

## 2016-12-17 DIAGNOSIS — E11628 Type 2 diabetes mellitus with other skin complications: Secondary | ICD-10-CM

## 2016-12-17 DIAGNOSIS — J45909 Unspecified asthma, uncomplicated: Secondary | ICD-10-CM | POA: Diagnosis not present

## 2016-12-17 DIAGNOSIS — E10319 Type 1 diabetes mellitus with unspecified diabetic retinopathy without macular edema: Secondary | ICD-10-CM | POA: Insufficient documentation

## 2016-12-17 DIAGNOSIS — E1040 Type 1 diabetes mellitus with diabetic neuropathy, unspecified: Secondary | ICD-10-CM | POA: Insufficient documentation

## 2016-12-17 DIAGNOSIS — T148XXA Other injury of unspecified body region, initial encounter: Secondary | ICD-10-CM

## 2016-12-17 DIAGNOSIS — E1169 Type 2 diabetes mellitus with other specified complication: Secondary | ICD-10-CM

## 2016-12-17 DIAGNOSIS — M79672 Pain in left foot: Secondary | ICD-10-CM | POA: Diagnosis present

## 2016-12-17 LAB — COMPREHENSIVE METABOLIC PANEL
ALBUMIN: 2.8 g/dL — AB (ref 3.5–5.0)
ALT: 13 U/L — ABNORMAL LOW (ref 14–54)
ANION GAP: 8 (ref 5–15)
AST: 17 U/L (ref 15–41)
Alkaline Phosphatase: 139 U/L — ABNORMAL HIGH (ref 38–126)
BUN: 16 mg/dL (ref 6–20)
CHLORIDE: 102 mmol/L (ref 101–111)
CO2: 27 mmol/L (ref 22–32)
Calcium: 8.7 mg/dL — ABNORMAL LOW (ref 8.9–10.3)
Creatinine, Ser: 1.77 mg/dL — ABNORMAL HIGH (ref 0.44–1.00)
GFR calc Af Amer: 43 mL/min — ABNORMAL LOW (ref >60)
GFR, EST NON AFRICAN AMERICAN: 37 mL/min — AB (ref >60)
GLUCOSE: 209 mg/dL — AB (ref 65–99)
Potassium: 4.5 mmol/L (ref 3.5–5.1)
Sodium: 137 mmol/L (ref 135–145)
TOTAL PROTEIN: 6 g/dL — AB (ref 6.5–8.1)

## 2016-12-17 LAB — CBC WITH DIFFERENTIAL/PLATELET
BASOS ABS: 0 10*3/uL (ref 0.0–0.1)
Basophils Relative: 0 %
Eosinophils Absolute: 0.3 10*3/uL (ref 0.0–0.7)
Eosinophils Relative: 3 %
HEMATOCRIT: 36.1 % (ref 36.0–46.0)
Hemoglobin: 11.8 g/dL — ABNORMAL LOW (ref 12.0–15.0)
LYMPHS ABS: 2.9 10*3/uL (ref 0.7–4.0)
LYMPHS PCT: 29 %
MCH: 27.6 pg (ref 26.0–34.0)
MCHC: 32.7 g/dL (ref 30.0–36.0)
MCV: 84.3 fL (ref 78.0–100.0)
Monocytes Absolute: 0.7 10*3/uL (ref 0.1–1.0)
Monocytes Relative: 7 %
NEUTROS ABS: 6.2 10*3/uL (ref 1.7–7.7)
Neutrophils Relative %: 61 %
Platelets: 459 10*3/uL — ABNORMAL HIGH (ref 150–400)
RBC: 4.28 MIL/uL (ref 3.87–5.11)
RDW: 13.5 % (ref 11.5–15.5)
WBC: 10.1 10*3/uL (ref 4.0–10.5)

## 2016-12-17 LAB — I-STAT BETA HCG BLOOD, ED (MC, WL, AP ONLY): I-stat hCG, quantitative: <5 m[IU]/mL (ref <5)

## 2016-12-17 LAB — I-STAT CG4 LACTIC ACID, ED: LACTIC ACID, VENOUS: 1.04 mmol/L (ref 0.5–1.9)

## 2016-12-17 MED ORDER — HYDROCODONE-ACETAMINOPHEN 5-325 MG PO TABS
2.0000 | ORAL_TABLET | Freq: Once | ORAL | Status: AC
  Administered 2016-12-17: 2 via ORAL
  Filled 2016-12-17: qty 2

## 2016-12-17 MED ORDER — SULFAMETHOXAZOLE-TRIMETHOPRIM 800-160 MG PO TABS: 1.0000 | ORAL_TABLET | Freq: Two times a day (BID) | ORAL | 0 refills | Status: AC

## 2016-12-17 NOTE — ED Triage Notes (Signed)
Patient has left foot wound x 1 week. Seen at [facility] today and started on po bactrim. Reports that when she got home noticed the skin looked darker under big toe, open wound under big toe with no drainage. No fever, no chills, NAD. Wound redressed with saline.

## 2016-12-17 NOTE — ED Provider Notes (Addendum)
The patient is a 31 year old female, known history of diabetes for over 20 years, presents with left great toe pain with some redness to the medial aspect of the first MTP as well as a crack at the joint line on the plantar surface of the toe. On exam the patient has minimal redness over the medial foot over the first MTP, decreased range of motion of that toe, no significant swelling or asymmetry, normal pulses, no purulent drainage, the patient is well-appearing with no leukocytosis. Renal function reviewed, she will be given a single dose of hydrocodone, she can follow-up in the outpatient setting. She was given a prescription for Bactrim at a prior urgent care visit earlier in the day which seems appropriate to treat this illness..  Medical screening examination/treatment/procedure(s) were conducted as a shared visit with non-physician practitioner(s) and myself.  I personally evaluated the patient during the encounter.  Clinical Impression:   Final diagnoses:  Wound infection         Noemi Chapel, MD 12/17/16 1936    Noemi Chapel, MD 12/18/16 [phone]

## 2016-12-17 NOTE — ED Provider Notes (Signed)
CSN: [id number]     Arrival date & time 12/17/16  1210 History   First MD Initiated Contact with Patient 12/17/16 1304     Chief Complaint  Patient presents with  . Wound Check   (Consider location/radiation/quality/duration/timing/severity/associated sxs/prior Treatment) Patient c/o diabetic foot wound left first toe.  She has a crack in her    The history is provided by the patient.  Wound Check  This is a new problem. The current episode started more than 1 week ago. The problem occurs constantly. The problem has not changed since onset.Nothing aggravates the symptoms. Nothing relieves the symptoms. She has tried nothing for the symptoms.    Past Medical History:  Diagnosis Date  . Anxiety   . Asthma   . Asthma 11/10/2013  . Depression   . Diabetic gastroparesis ([place])   . Diabetic neuropathy, type I diabetes mellitus ([place])   . Headache(784.0)   . Hypertension   . Kidney stones   . Polyneuropathy in diabetes(357.2)   . 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 Tachycardia    baseline tachycardia   . 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 CESAREAN SECTION    . CHOLECYSTECTOMY    . REFRACTIVE SURGERY     Family History  Problem Relation Age of Onset  . Hypertension     Social History  Substance Use Topics  . Smoking status: Never Smoker  . Smokeless tobacco: Never Used  . Alcohol use No   OB History    Gravida Para Term Preterm AB Living   2 1       1    SAB TAB Ectopic Multiple Live Births           1     Review of Systems  Constitutional: Negative.   HENT: Negative.   Eyes: Negative.   Respiratory: Negative.   Cardiovascular: Negative.   Gastrointestinal: Negative.   Endocrine: Negative.   Genitourinary: Negative.   Musculoskeletal: Negative.   Skin: Positive for wound.  Allergic/Immunologic: Negative.   Neurological: Negative.   Hematological:  Negative.   Psychiatric/Behavioral: Negative.     Allergies  Ceftin  Home Medications   Prior to Admission medications   Medication Sig Start Date End Date Taking? Authorizing Provider  albuterol (PROVENTIL HFA;VENTOLIN HFA) 108 (90 Base) MCG/ACT inhaler Inhale 2 puffs into the lungs every 6 (six) hours as needed for wheezing or shortness of breath. 11/17/16  Yes [name], MD  enalapril (VASOTEC) 10 MG tablet Take 10 mg by mouth daily.   Yes Historical Provider, MD  Insulin Human (INSULIN PUMP) SOLN Inject 1 each into the skin 3 times daily with meals, bedtime and 2 AM. 38 units/day basal; bolus sliding scale   Yes Historical Provider, MD  lamoTRIgine (LAMICTAL) 150 MG tablet Take 150 mg by mouth daily.   Yes Historical Provider, MD  Lurasidone HCl (LATUDA) 60 MG TABS Take by mouth.   Yes Historical Provider, MD  metoCLOPramide (REGLAN) 5 MG tablet Take 1 tablet (5 mg total) by mouth 3 (three) times daily before meals. 12/13/16  Yes Nelida Meuse III, MD  mometasone-formoterol (DULERA) 100-5 MCG/ACT AERO Inhale 2 puffs into the lungs 2 (two) times daily. 11/10/16  Yes [name], MD  montelukast (SINGULAIR) 10 MG tablet Take 1 tablet (10 mg total) by mouth at bedtime. 04/21/16  Yes [name], MD  [facility]  HFA 108 (90 Base) MCG/ACT inhaler INHALE 2 PUFFS INTO LUNGS EVERY 6 HOURS AS NEEDED FOR SHORTNESS OF BREATH 09/19/16  Yes Orlena Sheldon, PA-C  tolterodine (DETROL LA) 2 MG 24 hr capsule Take 1 capsule (2 mg total) by mouth daily. 12/07/16  Yes Mary B Dixon, PA-C  albuterol (PROVENTIL) (2.5 MG/3ML) 0.083% nebulizer solution Take 3 mLs (2.5 mg total) by nebulization every 4 (four) hours as needed for wheezing or shortness of breath. 11/10/16 05/06/18  [name], MD  azithromycin (ZITHROMAX) 250 MG tablet Day 1: Take 2 daily. Days 2 -5: Take 1 daily. Patient not taking: Reported on 11/22/2016 11/16/16   Orlena Sheldon, PA-C  HYDROcodone-acetaminophen (NORCO/VICODIN) 5-325 MG tablet Take  1 tablet by mouth every 6 (six) hours as needed. 11/22/16   Lonie Peak Dixon, PA-C  HYDROcodone-homatropine [facility]) 5-1.5 MG/5ML syrup Take 5 mLs by mouth every 6 (six) hours as needed for cough. 11/22/16   Lonie Peak Dixon, PA-C  predniSONE (DELTASONE) 20 MG tablet Take 3 tablets (60 mg total) by mouth daily with breakfast. Patient not taking: Reported on 11/22/2016 11/17/16   [name], MD  predniSONE (DELTASONE) 20 MG tablet Take 2 daily for 5 days 11/22/16   Orlena Sheldon, PA-C  sulfamethoxazole-trimethoprim (BACTRIM DS,SEPTRA DS) 800-160 MG tablet Take 1 tablet by mouth 2 (two) times daily. 12/17/16 12/24/16  Lysbeth Penner, FNP   Meds Ordered and Administered this Visit  Medications - No data to display  BP 115/75   Pulse 108   Temp 98.2 F (36.8 C) (Oral)   Resp 18   SpO2 99%  No data found.   Physical Exam  Constitutional: She appears well-developed and well-nourished.  HENT:  Head: Normocephalic and atraumatic.  Right Ear: External ear normal.  Left Ear: External ear normal.  Mouth/Throat: Oropharynx is clear and moist.  Eyes: Conjunctivae and EOM are normal. Pupils are equal, round, and reactive to light.  Neck: Normal range of motion. Neck supple.  Cardiovascular: Normal rate, regular rhythm and normal heart sounds.   Pulmonary/Chest: Effort normal and breath sounds normal.  Abdominal: Soft. Bowel sounds are normal.  Skin:  Left first toe with crack in the skin and the dorsum proximal to the first toe has callused skin with cracks and tenderness.  Nursing note and vitals reviewed.   Urgent Care Course     Procedures (including critical care time)  Labs Review Labs Reviewed - No data to display  Imaging Review No results found.   Visual Acuity Review  Right Eye Distance:   Left Eye Distance:   Bilateral Distance:    Right Eye Near:   Left Eye Near:    Bilateral Near:         MDM   1. Diabetic foot infection (HCC)    Bactrim DS one po bid x 10 days  #20 Betadine soaks to left toe and foot daily  Referral to wound clinic. Call office tomorrow. Follow up with PCP Dr. Dennard Schaumann call office tomorrow.    Lysbeth Penner, FNP 12/17/16 1419

## 2016-12-17 NOTE — ED Triage Notes (Signed)
C/O crack below left great toe x 5 days with discolored, painful callous to ball of foot.  Pt is diabetic.

## 2016-12-17 NOTE — ED Provider Notes (Signed)
[facility] DEPT Provider Note   CSN: [id number] Arrival date & time: 12/17/16  1728     History   Chief Complaint Chief Complaint  Patient presents with  . Foot Pain    HPI [name] is a 31 y.o. female.  HPI   31 yo female with PMH/o DM1 presents for worsening left foot pain. Pain initially started 1 week ago and worsened within the last 3 days. She describes it as a "throbbing" pain and states that it is worse with ambulation. She has taken Tylenol for pain with no significant relief. She was seen at the [facility] earlier today for the same symptoms. Wound was cleaned and bandaged at that time and she was discharged with instructions to follow-up with the wound clinic tomorrow and a 10 day course of Bactrim DS. Patient noted persistent pain after discharged. She also was concerned about part of the wound becoming black, prompting ED visit. She denies any fevers, CP, SOB, abdominal pain, nausea/vomiting.   Past Medical History:  Diagnosis Date  . Anxiety   . Asthma   . Asthma 11/10/2013  . Depression   . Diabetic gastroparesis ([place])   . Diabetic neuropathy, type I diabetes mellitus ([place])   . Headache(784.0)   . Hypertension   . Kidney stones   . Polyneuropathy in diabetes(357.2)   . 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[place])   . Tachycardia    baseline tachycardia   . 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 DM retinopathy ([place])   . Encounter for preconception consultation   . MDD (major depressive disorder), severe ([place]) 05/31/2014  . Drug overdose, intentional ([place]) 03/27/2014  . Asthma 11/10/2013  . Colles' fracture of left radius 10/11/2013  . Type I diabetes mellitus with complication, uncontrolled ([place]) 10/11/2013  . MDD (major depressive disorder), recurrent episode, severe ([place]) 09/26/2013  . Generalized anxiety disorder 09/26/2013  .  Hyperglycemia 04/30/2012  . Diabetic retinopathy associated with type 1 diabetes mellitus ([place]) 04/30/2012  . Polyneuropathy in diabetes(357.2) 04/30/2012  . H/O insertion of insulin pump 04/30/2012  . CHOLELITHIASIS 08/10/2010  . ATRIAL TACHYCARDIA 08/08/2010  . Gastroparesis 08/08/2010  . LIVER FUNCTION TESTS, ABNORMAL, HX OF 08/08/2010  . Type 1 diabetes mellitus with ketoacidosis, uncontrolled ([place]) 08/02/2010  . BOWEL INTUSSUSCEPTION 08/02/2010  . PANCREATITIS, ACUTE, HX OF 08/02/2010  . ABDOMINAL PAIN, LEFT UPPER QUADRANT 08/01/2010    Past Surgical History:  Procedure Laterality Date  . CESAREAN SECTION    . CHOLECYSTECTOMY    . REFRACTIVE SURGERY      OB History    Gravida Para Term Preterm AB Living   2 1       1    SAB TAB Ectopic Multiple Live Births           1       Home Medications    Prior to Admission medications   Medication Sig Start Date End Date Taking? Authorizing Provider  albuterol (PROVENTIL HFA;VENTOLIN HFA) 108 (90 Base) MCG/ACT inhaler Inhale 2 puffs into the lungs every 6 (six) hours as needed for wheezing or shortness of breath. 11/17/16   [name], MD  albuterol (PROVENTIL) (2.5 MG/3ML) 0.083% nebulizer solution Take 3 mLs (2.5 mg total) by nebulization every 4 (four) hours as needed for wheezing or shortness of breath. 11/10/16 05/06/18  [name], MD  azithromycin (ZITHROMAX) 250 MG  tablet Day 1: Take 2 daily. Days 2 -5: Take 1 daily. Patient not taking: Reported on 11/22/2016 11/16/16   Orlena Sheldon, PA-C  enalapril (VASOTEC) 10 MG tablet Take 10 mg by mouth daily.    Historical Provider, MD  HYDROcodone-acetaminophen (NORCO/VICODIN) 5-325 MG tablet Take 1 tablet by mouth every 6 (six) hours as needed. 11/22/16   Lonie Peak Dixon, PA-C  HYDROcodone-homatropine [facility]) 5-1.5 MG/5ML syrup Take 5 mLs by mouth every 6 (six) hours as needed for cough. 11/22/16   Lonie Peak Dixon, PA-C  Insulin Human (INSULIN PUMP) SOLN Inject 1 each into the skin 3  times daily with meals, bedtime and 2 AM. 38 units/day basal; bolus sliding scale    Historical Provider, MD  lamoTRIgine (LAMICTAL) 150 MG tablet Take 150 mg by mouth daily.    Historical Provider, MD  Lurasidone HCl (LATUDA) 60 MG TABS Take by mouth.    Historical Provider, MD  metoCLOPramide (REGLAN) 5 MG tablet Take 1 tablet (5 mg total) by mouth 3 (three) times daily before meals. 12/13/16   Nelida Meuse III, MD  mometasone-formoterol (DULERA) 100-5 MCG/ACT AERO Inhale 2 puffs into the lungs 2 (two) times daily. 11/10/16   [name], MD  montelukast (SINGULAIR) 10 MG tablet Take 1 tablet (10 mg total) by mouth at bedtime. 04/21/16   [name], MD  predniSONE (DELTASONE) 20 MG tablet Take 3 tablets (60 mg total) by mouth daily with breakfast. Patient not taking: Reported on 11/22/2016 11/17/16   [name], MD  predniSONE (DELTASONE) 20 MG tablet Take 2 daily for 5 days 11/22/16   Orlena Sheldon, PA-C  PROAIR HFA 108 [phone] Base) MCG/ACT inhaler INHALE 2 PUFFS INTO LUNGS EVERY 6 HOURS AS NEEDED FOR SHORTNESS OF BREATH 09/19/16   Orlena Sheldon, PA-C  sulfamethoxazole-trimethoprim (BACTRIM DS,SEPTRA DS) 800-160 MG tablet Take 1 tablet by mouth 2 (two) times daily. 12/17/16 12/24/16  Lysbeth Penner, FNP  tolterodine (DETROL LA) 2 MG 24 hr capsule Take 1 capsule (2 mg total) by mouth daily. 12/07/16   Orlena Sheldon, PA-C    Family History Family History  Problem Relation Age of Onset  . Hypertension      Social History Social History  Substance Use Topics  . Smoking status: Never Smoker  . Smokeless tobacco: Never Used  . Alcohol use No     Allergies   Ceftin   Review of Systems Review of Systems  Constitutional: Negative for fever.  Respiratory: Negative for shortness of breath.   Cardiovascular: Negative for chest pain.  Gastrointestinal: Negative for abdominal pain, nausea and vomiting.  Skin: Positive for wound.  All other systems reviewed and are  negative.    Physical Exam Updated Vital Signs BP 105/64   Pulse 98   Temp 97.9 F (36.6 C) (Oral)   Resp 16   SpO2 99%   Physical Exam  Constitutional: She is oriented to person, place, and time. She appears well-developed and well-nourished.  HENT:  Head: Normocephalic and atraumatic.  Eyes: Conjunctivae and lids are normal.  Cardiovascular: Normal rate, regular rhythm and normal pulses.   Pulmonary/Chest: Effort normal and breath sounds normal.  Abdominal: Soft. Normal appearance. There is no tenderness.  Musculoskeletal: Normal range of motion.  Neurological: She is alert and oriented to person, place, and time.  Skin: Skin is warm and dry.  Left 1st toe with skin crack extending the dorsal aspect of the MTP. Small callused area to the  medial aspect of the dorsum of the left foot with slight surrounding erythema. No necrotic tissue noted. Wound is clean and dry.    Nursing note and vitals reviewed.   ED Treatments / Results  Labs (all labs ordered are listed, but only abnormal results are displayed) Labs Reviewed  COMPREHENSIVE METABOLIC PANEL - Abnormal; Notable for the following:       Result Value   Glucose, Bld 209 (*)    Creatinine, Ser 1.77 (*)    Calcium 8.7 (*)    Total Protein 6.0 (*)    Albumin 2.8 (*)    ALT 13 (*)    Alkaline Phosphatase 139 (*)    Total Bilirubin <0.1 (*)    GFR calc non Af Amer 37 (*)    GFR calc Af Amer 43 (*)    All other components within normal limits  CBC WITH DIFFERENTIAL/PLATELET - Abnormal; Notable for the following:    Hemoglobin 11.8 (*)    Platelets 459 (*)    All other components within normal limits  I-STAT CG4 LACTIC ACID, ED  I-STAT BETA HCG BLOOD, ED ([facility], [facility], [facility] ONLY)    EKG  EKG Interpretation None       Radiology Dg Foot Complete Left  Result Date: 12/17/2016 CLINICAL DATA:  Left foot wound for 1 week with pain. Open wound under great toe. History of diabetes. EXAM: LEFT FOOT - COMPLETE 3+ VIEW  COMPARISON:  02/28/2016 radiographs FINDINGS: A fracture of the little toe proximal phalanx appears remote but correlate clinically. No other fracture, subluxation or dislocation identified. No radiographic evidence of osteomyelitis identified. IMPRESSION: No radiographic evidence of osteomyelitis. Little toe proximal phalanx fracture -appears remote but correlate clinically. Electronically Signed   By: Margarette Canada M.D.   On: 12/17/2016 18:23    Procedures Procedures (including critical care time)  Medications Ordered in ED Medications  HYDROcodone-acetaminophen (NORCO/VICODIN) 5-325 MG per tablet 2 tablet (2 tablets Oral Given 12/17/16 1938)     Initial Impression / Assessment and Plan / ED Course  I have reviewed the triage vital signs and the nursing notes.  Pertinent labs & imaging results that were available during my care of the patient were reviewed by me and considered in my medical decision making (see chart for details).      31 yo F with PMH/o Type 1 DM who presents with persistent pain associated with a wound infection. Was seen at Urgent Care earlier today for same symptoms. Discharged with Bactrim DS and follow-up with Wound Care. Came to ED after Urgent Care visit because she was concerned about persistent pain, difficulty walking, and an area that appeared black. Physical exam with clean wound of left 1st toe and medial aspect of left foot. No necrotic tissue noted. Previous labs from Urgent reviewed. WBC within normal limits. Given appearance of wound, patient does not require further management at this time. Encouraged patient to continue taking the bactrim as previously prescribed. Instructed patient to follow-up with the wound clinic as directed. Patient given one time dose of Vicodin for pain management in the ED and provided with a CAM walker for increased comfort. Advised patient to return to the ED for any worsening issues or concerns. Patient expresses understanding and  agreement with plan.     Final Clinical Impressions(s) / ED Diagnoses   Final diagnoses:  Wound infection    New Prescriptions Discharge Medication List as of 12/17/2016  7:36 PM       Orson Aloe,  PA 12/17/16 2034    Noemi Chapel, MD 12/18/16 [phone]

## 2016-12-17 NOTE — Progress Notes (Signed)
Orthopedic Tech Progress Note Patient Details:  [name] January 20, 1986 [id number]  Ortho Devices Type of Ortho Device: CAM walker Ortho Device/Splint Location: lle Ortho Device/Splint Interventions: Ordered, Application   [name] 12/17/2016, 8:24 PM

## 2016-12-17 NOTE — ED Notes (Signed)
The pt requested a foot bandage she has a callous appearing lesion to the base of her lt great toe.  Bandage placed.  Cam walker placed by the ortho tech

## 2016-12-17 NOTE — Discharge Instructions (Signed)
Continue to take the Bactrim as prescribed.  Follow-up with the wound clinic as instructed at Urgent Care today.  Return to the ER if you experience fever, worsening pain, increased redness.

## 2016-12-19 ENCOUNTER — Encounter: Payer: Self-pay | Admitting: Family Medicine

## 2016-12-19 ENCOUNTER — Ambulatory Visit (INDEPENDENT_AMBULATORY_CARE_PROVIDER_SITE_OTHER): Payer: BLUE CROSS/BLUE SHIELD | Admitting: Family Medicine

## 2016-12-19 VITALS — BP 110/70 | HR 88 | Temp 98.1°F | Ht 66.0 in | Wt 218.0 lb

## 2016-12-19 DIAGNOSIS — IMO0002 Reserved for concepts with insufficient information to code with codable children: Secondary | ICD-10-CM

## 2016-12-19 DIAGNOSIS — L03116 Cellulitis of left lower limb: Secondary | ICD-10-CM

## 2016-12-19 DIAGNOSIS — E1065 Type 1 diabetes mellitus with hyperglycemia: Secondary | ICD-10-CM | POA: Diagnosis not present

## 2016-12-19 DIAGNOSIS — S91302A Unspecified open wound, left foot, initial encounter: Secondary | ICD-10-CM

## 2016-12-19 DIAGNOSIS — E108 Type 1 diabetes mellitus with unspecified complications: Secondary | ICD-10-CM

## 2016-12-19 NOTE — Progress Notes (Signed)
Subjective:    Patient ID: [name], female    DOB: 11/26/1985, 31 y.o.   MRN: [id number]  HPI  Patient is a 31 y/o WF with history of DM I which has been poorly controlled for many years as well as neuropathy in both feet. Into the emergency room Sunday with pain in her left foot over the first MTP joint. The skin there is erythematous warm and tender. She also has thick hyperkeratotic skin on the plantar aspect of the first MTP joint on the left foot. The skin is so thick that it is cracked and fissured at the MTP joint on the plantar aspect of her left great toe. She has an appointment with the wound clinic next week. A wound is also open up over the lateral aspect of the left first MTP joint. Is 1 cm in diameter. Serous fluid is draining from the wound. It is very shallow and is only one to 2 mm deep. There is no visible bone. I did perform a wound culture of the fluid that is draining. In the emergency room she was started on Bactrim for cellulitis.  Past Medical History:  Diagnosis Date  . Anxiety   . Asthma   . Asthma 11/10/2013  . Depression   . Diabetic gastroparesis ([place])   . Diabetic neuropathy, type I diabetes mellitus ([place])   . Headache(784.0)   . Hypertension   . Kidney stones   . Polyneuropathy in diabetes(357.2)   . 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[place])   . Tachycardia    baseline tachycardia   . 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 CESAREAN SECTION    . CHOLECYSTECTOMY    . REFRACTIVE SURGERY     Current Outpatient Prescriptions on File Prior to Visit  Medication Sig Dispense Refill  . albuterol (PROVENTIL HFA;VENTOLIN HFA) 108 (90 Base) MCG/ACT inhaler Inhale 2 puffs into the lungs every 6 (six) hours as needed for wheezing or shortness of breath. 2 Inhaler 0  . albuterol (PROVENTIL) (2.5 MG/3ML) 0.083% nebulizer solution Take 3 mLs (2.5 mg total) by  nebulization every 4 (four) hours as needed for wheezing or shortness of breath. 30 vial 1  . azithromycin (ZITHROMAX) 250 MG tablet Day 1: Take 2 daily. Days 2 -5: Take 1 daily. (Patient not taking: Reported on 11/22/2016) 6 tablet 0  . enalapril (VASOTEC) 10 MG tablet Take 10 mg by mouth daily.    Marland Kitchen HYDROcodone-acetaminophen (NORCO/VICODIN) 5-325 MG tablet Take 1 tablet by mouth every 6 (six) hours as needed. 30 tablet 0  . HYDROcodone-homatropine (HYCODAN) 5-1.5 MG/5ML syrup Take 5 mLs by mouth every 6 (six) hours as needed for cough. 120 mL 0  . Insulin Human (INSULIN PUMP) SOLN Inject 1 each into the skin 3 times daily with meals, bedtime and 2 AM. 38 units/day basal; bolus sliding scale    . lamoTRIgine (LAMICTAL) 150 MG tablet Take 150 mg by mouth daily.    . Lurasidone HCl (LATUDA) 60 MG TABS Take by mouth.    . metoCLOPramide (REGLAN) 5 MG tablet Take 1 tablet (5 mg total) by mouth 3 (three) times daily before meals. 90 tablet 0  . mometasone-formoterol (DULERA) 100-5 MCG/ACT AERO Inhale 2 puffs into the lungs 2 (two) times daily. 1 Inhaler 5  . montelukast (SINGULAIR) 10 MG tablet Take 1 tablet (10 mg total) by mouth at bedtime. 30 tablet 11  .  predniSONE (DELTASONE) 20 MG tablet Take 3 tablets (60 mg total) by mouth daily with breakfast. (Patient not taking: Reported on 11/22/2016) 15 tablet 0  . predniSONE (DELTASONE) 20 MG tablet Take 2 daily for 5 days 10 tablet 0  . PROAIR HFA 108 (90 Base) MCG/ACT inhaler INHALE 2 PUFFS INTO LUNGS EVERY 6 HOURS AS NEEDED FOR SHORTNESS OF BREATH 17 Inhaler 3  . sulfamethoxazole-trimethoprim (BACTRIM DS,SEPTRA DS) 800-160 MG tablet Take 1 tablet by mouth 2 (two) times daily. 20 tablet 0  . tolterodine (DETROL LA) 2 MG 24 hr capsule Take 1 capsule (2 mg total) by mouth daily. 30 capsule 11   No current facility-administered medications on file prior to visit.    Allergies  Allergen Reactions  . Ceftin Rash   Social History   Social History  .  Marital status: Married    Spouse name: N/A  . Number of children: N/A  . Years of education: N/A   Occupational History  . Not on file.   Social History Main Topics  . Smoking status: Never Smoker  . Smokeless tobacco: Never Used  . Alcohol use No  . Drug use: No  . Sexual activity: Yes    Partners: Male   Other Topics Concern  . Not on file   Social History Narrative   Pt has a daughter and boyfriend.             Review of Systems  All other systems reviewed and are negative.      Objective:   Physical Exam  Constitutional: She appears well-developed and well-nourished. No distress.  HENT:  Right Ear: External ear normal.  Left Ear: External ear normal.  Nose: Nose normal.  Mouth/Throat: Oropharynx is clear and moist.  Neck: Neck supple.  Cardiovascular: Normal rate, regular rhythm and normal heart sounds.   Pulmonary/Chest: Effort normal. No respiratory distress. She has no wheezes. She has no rales.  Lymphadenopathy:    She has no cervical adenopathy.  Skin: She is not diaphoretic. There is erythema.  Vitals reviewed.  See HPI for description.       Assessment & Plan:  Type I diabetes mellitus with complication, uncontrolled ([place])  Unspecified open wound, left foot, initial encounter - Plan: WOUND CULTURE  Cellulitis of left foot  Patient has cellulitis of the left foot over the MTP joint. She also has a superficial ulcer on the lateral aspect of the left MTP joint. She is on Bactrim for cellulitis. She is on no medication for less than 48 hours and therefore is difficult to ascertain whether it's helpful at this point. I will recheck on Friday. Using a razor blade, I bluntly dissected the thick hyperkeratotic tissue from the plantar aspect of the left first MTP joint down to viable underlying skin to also facilitate wound healing. Spent more than 25 minutes with the patient to bring the wound. I then 4 x 4 gauze to create a thick pad to place over the  MTP joint to offload pressure wrapping met with [place] and. We discussed re-changing the bandages daily. Recheck here on Friday

## 2016-12-22 ENCOUNTER — Ambulatory Visit (INDEPENDENT_AMBULATORY_CARE_PROVIDER_SITE_OTHER): Payer: BLUE CROSS/BLUE SHIELD | Admitting: Family Medicine

## 2016-12-22 ENCOUNTER — Encounter: Payer: Self-pay | Admitting: Family Medicine

## 2016-12-22 VITALS — BP 134/94 | HR 102 | Temp 98.0°F | Resp 16 | Ht 66.0 in | Wt 218.0 lb

## 2016-12-22 DIAGNOSIS — S91302A Unspecified open wound, left foot, initial encounter: Secondary | ICD-10-CM | POA: Diagnosis not present

## 2016-12-22 DIAGNOSIS — E1065 Type 1 diabetes mellitus with hyperglycemia: Secondary | ICD-10-CM

## 2016-12-22 DIAGNOSIS — L03116 Cellulitis of left lower limb: Secondary | ICD-10-CM

## 2016-12-22 DIAGNOSIS — E108 Type 1 diabetes mellitus with unspecified complications: Secondary | ICD-10-CM | POA: Diagnosis not present

## 2016-12-22 DIAGNOSIS — IMO0002 Reserved for concepts with insufficient information to code with codable children: Secondary | ICD-10-CM

## 2016-12-22 LAB — WOUND CULTURE: Gram Stain: NONE SEEN

## 2016-12-22 MED ORDER — SULFAMETHOXAZOLE-TRIMETHOPRIM 800-160 MG PO TABS: 1.0000 | ORAL_TABLET | Freq: Two times a day (BID) | ORAL | 0 refills | Status: DC

## 2016-12-22 NOTE — Progress Notes (Signed)
Subjective:    Patient ID: [name], female    DOB: 06-Feb-1986, 31 y.o.   MRN: [id number]  HPI  12/19/16 Patient is a 31 y/o WF with history of DM I which has been poorly controlled for many years as well as neuropathy in both feet. Into the emergency room Sunday with pain in her left foot over the first MTP joint. The skin there is erythematous warm and tender. She also has thick hyperkeratotic skin on the plantar aspect of the first MTP joint on the left foot. The skin is so thick that it is cracked and fissured at the MTP joint on the plantar aspect of her left great toe. She has an appointment with the wound clinic next week. A wound is also open up over the lateral aspect of the left first MTP joint. Is 1 cm in diameter. Serous fluid is draining from the wound. It is very shallow and is only one to 2 mm deep. There is no visible bone. I did perform a wound culture of the fluid that is draining. In the emergency room she was started on Bactrim for cellulitis. At that time, my plan was: Patient has cellulitis of the left foot over the MTP joint. She also has a superficial ulcer on the lateral aspect of the left MTP joint. She is on Bactrim for cellulitis. She is on no medication for less than 48 hours and therefore is difficult to ascertain whether it's helpful at this point. I will recheck on Friday. Using a razor blade, I bluntly dissected the thick hyperkeratotic tissue from the plantar aspect of the left first MTP joint down to viable underlying skin to also facilitate wound healing. Spent more than 25 minutes with the patient to bring the wound. I then 4 x 4 gauze to create a thick pad to place over the MTP joint to offload pressure wrapping met with [place] and. We discussed re-changing the bandages daily. Recheck here on Friday  12/22/16 Wound culture revealed MRSA sensitive to Bactrim. Patient has been on the medication now for more than 5 days. Unfortunately the erythema over the  medial portion of the left first MTP joint has not improved. There is no evidence of spread thankfully. Thankfully the wound over the MTP joint seems to be improving. It is now 1 cm in diameter that is extremely shallow and seems to be healing well. My concern is for possible osteomyelitis in the first metatarsal. Given the patient's history of noncompliance, I suspect that this is been going on for quite some time.  She states that she has not seen her endocrinologist for quite some time and she's not sure what her sugars are running Past Medical History:  Diagnosis Date  . Anxiety   . Asthma   . Asthma 11/10/2013  . Depression   . Diabetic gastroparesis ([place])   . Diabetic neuropathy, type I diabetes mellitus ([place])   . Headache(784.0)   . Hypertension   . Kidney stones   . Polyneuropathy in diabetes(357.2)   . 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[place])   . Tachycardia    baseline tachycardia   . 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 CESAREAN SECTION    . CHOLECYSTECTOMY    . REFRACTIVE SURGERY     Current Outpatient Prescriptions on File Prior to Visit  Medication Sig Dispense Refill  . albuterol (PROVENTIL HFA;VENTOLIN HFA)  108 (90 Base) MCG/ACT inhaler Inhale 2 puffs into the lungs every 6 (six) hours as needed for wheezing or shortness of breath. 2 Inhaler 0  . albuterol (PROVENTIL) (2.5 MG/3ML) 0.083% nebulizer solution Take 3 mLs (2.5 mg total) by nebulization every 4 (four) hours as needed for wheezing or shortness of breath. 30 vial 1  . enalapril (VASOTEC) 10 MG tablet Take 10 mg by mouth daily.    Marland Kitchen HYDROcodone-acetaminophen (NORCO/VICODIN) 5-325 MG tablet Take 1 tablet by mouth every 6 (six) hours as needed. 30 tablet 0  . HYDROcodone-homatropine (HYCODAN) 5-1.5 MG/5ML syrup Take 5 mLs by mouth every 6 (six) hours as needed for cough. 120 mL 0  . Insulin Human (INSULIN PUMP) SOLN  Inject 1 each into the skin 3 times daily with meals, bedtime and 2 AM. 38 units/day basal; bolus sliding scale    . lamoTRIgine (LAMICTAL) 150 MG tablet Take 150 mg by mouth daily.    . Lurasidone HCl (LATUDA) 60 MG TABS Take by mouth.    . metoCLOPramide (REGLAN) 5 MG tablet Take 1 tablet (5 mg total) by mouth 3 (three) times daily before meals. 90 tablet 0  . mometasone-formoterol (DULERA) 100-5 MCG/ACT AERO Inhale 2 puffs into the lungs 2 (two) times daily. 1 Inhaler 5  . montelukast (SINGULAIR) 10 MG tablet Take 1 tablet (10 mg total) by mouth at bedtime. 30 tablet 11  . PROAIR HFA 108 (90 Base) MCG/ACT inhaler INHALE 2 PUFFS INTO LUNGS EVERY 6 HOURS AS NEEDED FOR SHORTNESS OF BREATH 17 Inhaler 3  . sulfamethoxazole-trimethoprim (BACTRIM DS,SEPTRA DS) 800-160 MG tablet Take 1 tablet by mouth 2 (two) times daily. 20 tablet 0  . tolterodine (DETROL LA) 2 MG 24 hr capsule Take 1 capsule (2 mg total) by mouth daily. 30 capsule 11  . traZODone (DESYREL) 50 MG tablet Take 50 mg by mouth at bedtime.   0  . zolpidem (AMBIEN) 5 MG tablet   1   No current facility-administered medications on file prior to visit.    Allergies  Allergen Reactions  . Ceftin Rash   Social History   Social History  . Marital status: Married    Spouse name: N/A  . Number of children: N/A  . Years of education: N/A   Occupational History  . Not on file.   Social History Main Topics  . Smoking status: Never Smoker  . Smokeless tobacco: Never Used  . Alcohol use No  . Drug use: No  . Sexual activity: Yes    Partners: Male   Other Topics Concern  . Not on file   Social History Narrative   Pt has a daughter and boyfriend.             Review of Systems  All other systems reviewed and are negative.      Objective:   Physical Exam  Constitutional: She appears well-developed and well-nourished. No distress.  HENT:  Right Ear: External ear normal.  Left Ear: External ear normal.  Nose: Nose  normal.  Mouth/Throat: Oropharynx is clear and moist.  Neck: Neck supple.  Cardiovascular: Normal rate, regular rhythm and normal heart sounds.   Pulmonary/Chest: Effort normal. No respiratory distress. She has no wheezes. She has no rales.  Musculoskeletal:       Feet:  Lymphadenopathy:    She has no cervical adenopathy.  Skin: She is not diaphoretic. There is erythema.  Vitals reviewed.  See HPI for description.       Assessment &  Plan:  Type I diabetes mellitus with complication, uncontrolled ([place]) - Plan: MR FOOT LEFT W CONTRAST  Unspecified open wound, left foot, initial encounter - Plan: MR FOOT LEFT W CONTRAST  Cellulitis of left foot - Plan: MR FOOT LEFT W CONTRAST  I'm concerned about possible steel myelitis the left foot. X-ray obtained at the emergency room on Sunday reveal no evidence of osteomyelitis however this is not a sensitive test for osteomyelitis. Therefore I will schedule the patient for an MRI as an outpatient. Continue Bactrim based on the sensitivities for an additional 10 days. Await the results of MRI. If positive for osteomyelitis, she would need referral to a foot specialist to discuss surgical options

## 2016-12-25 ENCOUNTER — Other Ambulatory Visit: Payer: Self-pay | Admitting: Family Medicine

## 2016-12-25 ENCOUNTER — Ambulatory Visit: Payer: BLUE CROSS/BLUE SHIELD | Admitting: Family Medicine

## 2016-12-25 DIAGNOSIS — S91302A Unspecified open wound, left foot, initial encounter: Secondary | ICD-10-CM

## 2016-12-25 DIAGNOSIS — E1065 Type 1 diabetes mellitus with hyperglycemia: Secondary | ICD-10-CM

## 2016-12-25 DIAGNOSIS — IMO0002 Reserved for concepts with insufficient information to code with codable children: Secondary | ICD-10-CM

## 2016-12-25 DIAGNOSIS — E108 Type 1 diabetes mellitus with unspecified complications: Principal | ICD-10-CM

## 2016-12-25 DIAGNOSIS — L03116 Cellulitis of left lower limb: Secondary | ICD-10-CM

## 2016-12-26 ENCOUNTER — Other Ambulatory Visit: Payer: Self-pay | Admitting: Family Medicine

## 2016-12-26 ENCOUNTER — Ambulatory Visit
Admission: RE | Admit: 2016-12-26 | Discharge: 2016-12-26 | Disposition: A | Discharge status: Home / Self Care | Payer: BLUE CROSS/BLUE SHIELD | Source: Ambulatory Visit | Attending: Family Medicine | Admitting: Family Medicine

## 2016-12-26 DIAGNOSIS — E1065 Type 1 diabetes mellitus with hyperglycemia: Secondary | ICD-10-CM

## 2016-12-26 DIAGNOSIS — S91302A Unspecified open wound, left foot, initial encounter: Secondary | ICD-10-CM

## 2016-12-26 DIAGNOSIS — L03116 Cellulitis of left lower limb: Secondary | ICD-10-CM

## 2016-12-26 DIAGNOSIS — IMO0002 Reserved for concepts with insufficient information to code with codable children: Secondary | ICD-10-CM

## 2016-12-26 DIAGNOSIS — E108 Type 1 diabetes mellitus with unspecified complications: Principal | ICD-10-CM

## 2016-12-27 ENCOUNTER — Encounter (HOSPITAL_BASED_OUTPATIENT_CLINIC_OR_DEPARTMENT_OTHER): Payer: BLUE CROSS/BLUE SHIELD | Attending: Surgery

## 2016-12-27 ENCOUNTER — Other Ambulatory Visit: Payer: Self-pay | Admitting: Family Medicine

## 2016-12-27 DIAGNOSIS — E108 Type 1 diabetes mellitus with unspecified complications: Secondary | ICD-10-CM

## 2016-12-27 DIAGNOSIS — E1065 Type 1 diabetes mellitus with hyperglycemia: Secondary | ICD-10-CM

## 2016-12-27 DIAGNOSIS — IMO0002 Reserved for concepts with insufficient information to code with codable children: Secondary | ICD-10-CM

## 2016-12-27 DIAGNOSIS — S91302A Unspecified open wound, left foot, initial encounter: Secondary | ICD-10-CM

## 2016-12-28 ENCOUNTER — Ambulatory Visit: Payer: BLUE CROSS/BLUE SHIELD | Admitting: Family Medicine

## 2016-12-28 LAB — HM DIABETES FOOT EXAM

## 2016-12-29 ENCOUNTER — Encounter: Payer: Self-pay | Admitting: Gastroenterology

## 2016-12-29 ENCOUNTER — Ambulatory Visit (INDEPENDENT_AMBULATORY_CARE_PROVIDER_SITE_OTHER): Payer: BLUE CROSS/BLUE SHIELD | Admitting: Gastroenterology

## 2016-12-29 VITALS — BP 124/78 | HR 98 | Resp 16 | Ht 66.0 in | Wt 220.0 lb

## 2016-12-29 DIAGNOSIS — K5901 Slow transit constipation: Secondary | ICD-10-CM | POA: Diagnosis not present

## 2016-12-29 DIAGNOSIS — K3184 Gastroparesis: Secondary | ICD-10-CM

## 2016-12-29 DIAGNOSIS — K219 Gastro-esophageal reflux disease without esophagitis: Secondary | ICD-10-CM

## 2016-12-29 NOTE — Progress Notes (Signed)
[facility] Progress Note  Chief Complaint: Gastroparesis and constipation  Subjective  History:  This is one-year follow-up for 31 year old woman with gastroparesis and resultant GERD as well as slow transit constipation. See EGD and colonoscopy report from me in February 2017. She had poorly controlled type 2 diabetes that she reports is now somewhat better. [name] says her hemoglobin A1c several months ago was between 7 and 8, where as it had been over 11 year ago. She is due to see her endocrinologist in the next couple of months. She still takes the Reglan, though not every day. When she has nausea or increased belching or bloating she will take 1 or 2 tablets. She has been unable to gain weight and attributes some of this to chronic constipation. She also gave birth to her second child in October 2017. She denies dysphagia, odynophagia or vomiting. Her blood sugar control has been certainly better than a year ago, but says there is still sometimes significant swings in it. Lastly, she still tends toward constipation which has been present for years. Once daily MiraLAX did not seem to be of much help. She has difficulty increasing her dietary fiber due to gastroparesis. ROS: Cardiovascular:  no chest pain Respiratory: no dyspnea  The patient's Past Medical, Family and Social History were reviewed and are on file in the EMR.  Objective:  Med list reviewed  Vital signs in last 24 hrs: Vitals:   12/29/16 1132  BP: 124/78  Pulse: 98  Resp: 16    Physical Exam    HEENT: sclera anicteric, oral mucosa moist without lesions  Neck: supple, no thyromegaly, JVD or lymphadenopathy  Cardiac: RRR without murmurs, S1S2 heard, no peripheral edema  Pulm: clear to auscultation bilaterally, normal RR and effort noted  Abdomen: soft, No tenderness, with active bowel sounds. No guarding or palpable hepatosplenomegaly. Insulin pump left hemiabdomen  Skin; warm and dry, no  jaundice or rash  Recent Labs:  CMP Latest Ref Rng & Units 12/17/2016 02/03/2016 02/01/2016  Glucose 65 - 99 mg/dL 209(H) 284(H) 202(H)  BUN 6 - 20 mg/dL 16 20 19   Creatinine 0.44 - 1.00 mg/dL 1.77(H) 1.25(H) 1.09(H)  Sodium 135 - 145 mmol/L 137 135 136  Potassium 3.5 - 5.1 mmol/L 4.5 4.7 3.8  Chloride 101 - 111 mmol/L 102 105 106  CO2 22 - 32 mmol/L 27 21(L) 24  Calcium 8.9 - 10.3 mg/dL 8.7(L) 9.0 8.5(L)  Total Protein 6.5 - 8.1 g/dL 6.0(L) - 6.1(L)  Total Bilirubin 0.3 - 1.2 mg/dL <0.1(L) - 0.2(L)  Alkaline Phos 38 - 126 U/L 139(H) - 99  AST 15 - 41 U/L 17 - 19  ALT 14 - 54 U/L 13(L) - 19   CBC Latest Ref Rng & Units 12/17/2016 02/03/2016 02/01/2016  WBC 4.0 - 10.5 K/uL 10.1 10.0 8.6  Hemoglobin 12.0 - 15.0 g/dL 11.8(L) 8.9(L) 9.5(L)  Hematocrit 36.0 - 46.0 % 36.1 27.4(L) 28.3(L)  Platelets 150 - 400 K/uL 459(H) 264 265     @ASSESSMENTPLANBEGIN @ Assessment: Encounter Diagnoses  Name Primary?  . Gastroparesis Yes  . Gastroesophageal reflux disease without esophagitis   . Slow transit constipation    Now that her glucose control is much improved from what was a year ago as evidenced by reportedly decreased hemoglobin A1c, I am in agreement with her plan to taper down the Reglan based on symptoms. She is aware of the possible neurologic side effects, but I reviewed that again today.  When she sees  endocrinology, if they are concerned about glucose control, she may need to go back on Reglan daily for several weeks.  Plan: Increase MiraLAX to one capful twice daily Continue current Reglan dosing as discussed above.  His was recently refilled.  She will call an update in couple of weeks. If constipation not much improved, probably a trial of Amitiza or Linzess.   Total time 30 minutes, over half spent in counseling and coordination of care.   Nelida Meuse III

## 2016-12-29 NOTE — Patient Instructions (Signed)
If you are age 32 or older, your body mass index should be between 23-30. Your Body mass index is 35.51 kg/m. If this is out of the aforementioned range listed, please consider follow up with your Primary Care Provider.  If you are age 19 or younger, your body mass index should be between 19-25. Your Body mass index is 35.51 kg/m. If this is out of the aformentioned range listed, please consider follow up with your Primary Care Provider.   Follow up as needed.  Thank you for choosing  GI  Dr Wilfrid Lund III

## 2017-01-01 DIAGNOSIS — N183 Chronic kidney disease, stage 3 unspecified: Secondary | ICD-10-CM | POA: Insufficient documentation

## 2017-01-01 DIAGNOSIS — N1832 Chronic kidney disease, stage 3b: Secondary | ICD-10-CM | POA: Insufficient documentation

## 2017-01-04 ENCOUNTER — Other Ambulatory Visit: Payer: Self-pay

## 2017-01-18 ENCOUNTER — Encounter (HOSPITAL_COMMUNITY): Payer: Self-pay | Admitting: Vascular Surgery

## 2017-01-18 ENCOUNTER — Telehealth: Payer: Self-pay | Admitting: Family Medicine

## 2017-01-18 ENCOUNTER — Emergency Department (HOSPITAL_COMMUNITY)
Admission: EM | Admit: 2017-01-18 | Discharge: 2017-01-19 | Disposition: A | Discharge status: Home / Self Care | Payer: BLUE CROSS/BLUE SHIELD | Attending: Emergency Medicine | Admitting: Emergency Medicine

## 2017-01-18 DIAGNOSIS — N939 Abnormal uterine and vaginal bleeding, unspecified: Secondary | ICD-10-CM

## 2017-01-18 DIAGNOSIS — Y761 Therapeutic (nonsurgical) and rehabilitative obstetric and gynecological devices associated with adverse incidents: Secondary | ICD-10-CM | POA: Diagnosis not present

## 2017-01-18 DIAGNOSIS — T8332XA Displacement of intrauterine contraceptive device, initial encounter: Secondary | ICD-10-CM | POA: Insufficient documentation

## 2017-01-18 DIAGNOSIS — E103419 Type 1 diabetes mellitus with severe nonproliferative diabetic retinopathy with macular edema, unspecified eye: Secondary | ICD-10-CM | POA: Diagnosis not present

## 2017-01-18 DIAGNOSIS — J45909 Unspecified asthma, uncomplicated: Secondary | ICD-10-CM | POA: Diagnosis not present

## 2017-01-18 DIAGNOSIS — I10 Essential (primary) hypertension: Secondary | ICD-10-CM | POA: Insufficient documentation

## 2017-01-18 DIAGNOSIS — E1040 Type 1 diabetes mellitus with diabetic neuropathy, unspecified: Secondary | ICD-10-CM | POA: Diagnosis not present

## 2017-01-18 DIAGNOSIS — Z79899 Other long term (current) drug therapy: Secondary | ICD-10-CM | POA: Insufficient documentation

## 2017-01-18 LAB — URINALYSIS, ROUTINE W REFLEX MICROSCOPIC
Bilirubin Urine: NEGATIVE
Glucose, UA: >=500 mg/dL — AB
Ketones, ur: NEGATIVE mg/dL
Leukocytes, UA: NEGATIVE
Nitrite: NEGATIVE
PH: 5 (ref 5.0–8.0)
Protein, ur: 100 mg/dL — AB
SPECIFIC GRAVITY, URINE: 1.015 (ref 1.005–1.030)

## 2017-01-18 LAB — COMPREHENSIVE METABOLIC PANEL
ALBUMIN: 3.3 g/dL — AB (ref 3.5–5.0)
ALT: 16 U/L (ref 14–54)
AST: 17 U/L (ref 15–41)
Alkaline Phosphatase: 127 U/L — ABNORMAL HIGH (ref 38–126)
Anion gap: 11 (ref 5–15)
BUN: 21 mg/dL — AB (ref 6–20)
CO2: 27 mmol/L (ref 22–32)
CREATININE: 1.64 mg/dL — AB (ref 0.44–1.00)
Calcium: 9.5 mg/dL (ref 8.9–10.3)
Chloride: 101 mmol/L (ref 101–111)
GFR calc Af Amer: 47 mL/min — ABNORMAL LOW (ref >60)
GFR, EST NON AFRICAN AMERICAN: 41 mL/min — AB (ref >60)
GLUCOSE: 243 mg/dL — AB (ref 65–99)
Potassium: 4.2 mmol/L (ref 3.5–5.1)
SODIUM: 139 mmol/L (ref 135–145)
Total Bilirubin: 0.3 mg/dL (ref 0.3–1.2)
Total Protein: 6 g/dL — ABNORMAL LOW (ref 6.5–8.1)

## 2017-01-18 LAB — LIPASE, BLOOD: LIPASE: 11 U/L (ref 11–51)

## 2017-01-18 LAB — CBC
HEMATOCRIT: 32.9 % — AB (ref 36.0–46.0)
Hemoglobin: 10.9 g/dL — ABNORMAL LOW (ref 12.0–15.0)
MCH: 27.5 pg (ref 26.0–34.0)
MCHC: 33.1 g/dL (ref 30.0–36.0)
MCV: 82.9 fL (ref 78.0–100.0)
PLATELETS: 309 10*3/uL (ref 150–400)
RBC: 3.97 MIL/uL (ref 3.87–5.11)
RDW: 13.6 % (ref 11.5–15.5)
WBC: 8.9 10*3/uL (ref 4.0–10.5)

## 2017-01-18 LAB — POC URINE PREG, ED: PREG TEST UR: NEGATIVE

## 2017-01-18 MED ORDER — VALACYCLOVIR HCL 1 G PO TABS: ORAL_TABLET | ORAL | 3 refills | Status: DC

## 2017-01-18 NOTE — ED Triage Notes (Signed)
Pt reports to the ED for eval of vaginal bleeding and abd pain. She states that she had a baby and had an IUD placed in January she states that ever since then she has had vaginal bleeding. States that she may have one day in between episodes where she will not bleed. She states that on Monday she developed severe lower abd cramps. She states that she took some Vicodin which helped her pain. She states that today she started having more heavy vaginal bleeding (2 pads every hour and passing clots) and she developed the severe pain and cramping again. Today she called the OB line and they told her to come here for for further eval.

## 2017-01-18 NOTE — Telephone Encounter (Signed)
Medication called/sent to requested pharmacy and pt aware via vm 

## 2017-01-18 NOTE — Telephone Encounter (Signed)
Valtrex 2 g pobid x 1 day

## 2017-01-18 NOTE — Telephone Encounter (Signed)
[facility]  PATIENT WOULD LIKE TO KNOW IF SOMETHING CAN BE CALLED IN FOR HER FEVER BLISTER  [phone]

## 2017-01-19 ENCOUNTER — Encounter: Payer: Self-pay | Admitting: Podiatry

## 2017-01-19 ENCOUNTER — Ambulatory Visit (INDEPENDENT_AMBULATORY_CARE_PROVIDER_SITE_OTHER): Payer: BLUE CROSS/BLUE SHIELD | Admitting: Podiatry

## 2017-01-19 ENCOUNTER — Emergency Department (HOSPITAL_COMMUNITY): Payer: BLUE CROSS/BLUE SHIELD

## 2017-01-19 DIAGNOSIS — B351 Tinea unguium: Secondary | ICD-10-CM | POA: Diagnosis not present

## 2017-01-19 DIAGNOSIS — L84 Corns and callosities: Secondary | ICD-10-CM | POA: Diagnosis not present

## 2017-01-19 DIAGNOSIS — E1149 Type 2 diabetes mellitus with other diabetic neurological complication: Secondary | ICD-10-CM

## 2017-01-19 LAB — WET PREP, GENITAL
CLUE CELLS WET PREP: NONE SEEN
Sperm: NONE SEEN
Trich, Wet Prep: NONE SEEN
WBC WET PREP: NONE SEEN
YEAST WET PREP: NONE SEEN

## 2017-01-19 LAB — GC/CHLAMYDIA PROBE AMP (~~LOC~~) NOT AT ARMC
Chlamydia: NEGATIVE
NEISSERIA GONORRHEA: NEGATIVE

## 2017-01-19 NOTE — Discharge Instructions (Signed)
We have removed your IUD. You will need to use another form of birth control until your follow up at [facility]. If your bleeding increases, you develop fever or abdominal pain go to [facility] for further evaluation.

## 2017-01-19 NOTE — ED Notes (Signed)
Pt stable, understands discharge instructions, and reasons for return.   

## 2017-01-19 NOTE — Patient Instructions (Signed)

## 2017-01-19 NOTE — ED Provider Notes (Signed)
[facility] DEPT Provider Note   CSN: [id number] Arrival date & time: 01/18/17  2217     History   Chief Complaint Chief Complaint  Patient presents with  . Vaginal Bleeding  . Abdominal Pain    HPI [name] is a 31 y.o. female with a hx of Type 1 diabetes and multiple other chronic health problems who presents to the ED with vaginal bleeding. Patient reports that she had her last period in Jan and has had bleeding off and on since then. Today the bleeding got heaver and she reports lower abdominal cramping. She had an IUD placed in Jan when the bleeding started. Patient states she was going through 2 pads per hour and passing clots earlier today. She is a  G3P2 (SAB x1).    HPI  Past Medical History:  Diagnosis Date  . Anxiety   . Asthma   . Asthma 11/10/2013  . Depression   . Diabetic gastroparesis ([place])   . Diabetic neuropathy, type I diabetes mellitus ([place])   . Headache(784.0)   . Hypertension   . Kidney stones   . Polyneuropathy in diabetes(357.2)   . 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[place])   . Tachycardia    baseline tachycardia   . 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 DM retinopathy ([place])   . Encounter for preconception consultation   . MDD (major depressive disorder), severe ([place]) 05/31/2014  . Drug overdose, intentional ([place]) 03/27/2014  . Asthma 11/10/2013  . Colles' fracture of left radius 10/11/2013  . Type I diabetes mellitus with complication, uncontrolled ([place]) 10/11/2013  . MDD (major depressive disorder), recurrent episode, severe ([place]) 09/26/2013  . Generalized anxiety disorder 09/26/2013  . Hyperglycemia 04/30/2012  . Diabetic retinopathy associated with type 1 diabetes mellitus ([place]) 04/30/2012  . Polyneuropathy in diabetes(357.2) 04/30/2012  . H/O insertion of insulin pump 04/30/2012  . CHOLELITHIASIS 08/10/2010  . ATRIAL  TACHYCARDIA 08/08/2010  . Gastroparesis 08/08/2010  . LIVER FUNCTION TESTS, ABNORMAL, HX OF 08/08/2010  . Type 1 diabetes mellitus with ketoacidosis, uncontrolled ([place]) 08/02/2010  . BOWEL INTUSSUSCEPTION 08/02/2010  . PANCREATITIS, ACUTE, HX OF 08/02/2010  . ABDOMINAL PAIN, LEFT UPPER QUADRANT 08/01/2010    Past Surgical History:  Procedure Laterality Date  . CESAREAN SECTION    . CHOLECYSTECTOMY    . REFRACTIVE SURGERY      OB History    Gravida Para Term Preterm AB Living   2 1       1    SAB TAB Ectopic Multiple Live Births           1       Home Medications    Prior to Admission medications   Medication Sig Start Date End Date Taking? Authorizing Provider  albuterol (PROVENTIL HFA;VENTOLIN HFA) 108 (90 Base) MCG/ACT inhaler Inhale 2 puffs into the lungs every 6 (six) hours as needed for wheezing or shortness of breath. 11/17/16   [name], MD  albuterol (PROVENTIL) (2.5 MG/3ML) 0.083% nebulizer solution Take 3 mLs (2.5 mg total) by nebulization every 4 (four) hours as needed for wheezing or shortness of breath. 11/10/16 05/06/18  [name], MD  enalapril (VASOTEC) 10 MG tablet Take 10 mg by mouth daily.    Historical Provider, MD  furosemide (LASIX) 40 MG tablet Take 40 mg by mouth. Every other day    Historical Provider, MD  HYDROcodone-homatropine (  HYCODAN) 5-1.5 MG/5ML syrup Take 5 mLs by mouth every 6 (six) hours as needed for cough. 11/22/16   Lonie Peak Dixon, PA-C  Insulin Human (INSULIN PUMP) SOLN Inject 1 each into the skin 3 times daily with meals, bedtime and 2 AM. 38 units/day basal; bolus sliding scale    Historical Provider, MD  lamoTRIgine (LAMICTAL) 150 MG tablet Take 150 mg by mouth daily.    Historical Provider, MD  Lurasidone HCl (LATUDA) 60 MG TABS Take by mouth.    Historical Provider, MD  metoCLOPramide (REGLAN) 5 MG tablet Take 1 tablet (5 mg total) by mouth 3 (three) times daily before meals. 12/13/16   Nelida Meuse III, MD  mometasone-formoterol  (DULERA) 100-5 MCG/ACT AERO Inhale 2 puffs into the lungs 2 (two) times daily. 11/10/16   [name], MD  montelukast (SINGULAIR) 10 MG tablet Take 1 tablet (10 mg total) by mouth at bedtime. 04/21/16   [name], MD  NONFORMULARY OR COMPOUNDED ITEM [facility]  Onychomycosis Nail Lacquer -  Fluconazole 2%, Terbinafine 1% DMSO Apply to affected nail once daily Qty. 120 gm 3 refills    Historical Provider, MD  PROAIR HFA 108 (90 Base) MCG/ACT inhaler INHALE 2 PUFFS INTO LUNGS EVERY 6 HOURS AS NEEDED FOR SHORTNESS OF BREATH 09/19/16   Orlena Sheldon, PA-C  tolterodine (DETROL LA) 2 MG 24 hr capsule Take 1 capsule (2 mg total) by mouth daily. 12/07/16   Lonie Peak Dixon, PA-C  traZODone (DESYREL) 50 MG tablet Take 50 mg by mouth at bedtime.  11/30/16   Historical Provider, MD  valACYclovir (VALTREX) 1000 MG tablet 2 tabs po bid x 1 prn breakout 01/18/17 06/22/17  [name], MD    Family History Family History  Problem Relation Age of Onset  . Hypertension      Social History Social History  Substance Use Topics  . Smoking status: Never Smoker  . Smokeless tobacco: Never Used  . Alcohol use No     Allergies   Ceftin   Review of Systems Review of Systems  Constitutional: Positive for chills. Negative for fever.  HENT: Negative for congestion, ear pain and sore throat.   Eyes: Negative for visual disturbance.  Respiratory: Negative for cough and shortness of breath.   Cardiovascular: Negative for chest pain and leg swelling.  Gastrointestinal: Positive for abdominal pain. Negative for diarrhea, nausea and vomiting.  Genitourinary: Positive for pelvic pain and vaginal bleeding. Negative for dysuria and urgency.  Musculoskeletal: Positive for back pain.  Skin: Rash: eczema.  Neurological: Positive for light-headedness. Negative for syncope and headaches.  Hematological: Does not bruise/bleed easily.  Psychiatric/Behavioral: Negative for confusion.     Physical  Exam Updated Vital Signs BP 139/87 (BP Location: Left Arm)   Pulse 99   Temp 97.9 F (36.6 C) (Oral)   Resp 18   LMP 12/26/2016   SpO2 99%   Physical Exam  Constitutional: She is oriented to person, place, and time. She appears well-developed and well-nourished. No distress.  Eyes: EOM are normal.  Neck: Neck supple.  Pulmonary/Chest: Effort normal.  Abdominal: Soft. There is no tenderness.  Genitourinary:  Genitourinary Comments: External genitalia without lesions, moderate blood vaginal vault, IUD string visible. Mild CMT, no adnexal tenderness.   Musculoskeletal: Normal range of motion.  Neurological: She is alert and oriented to person, place, and time. No cranial nerve deficit.  Skin: Skin is warm and dry.  Psychiatric: She has a normal mood and affect.  Nursing note and vitals reviewed.    ED Treatments / Results  Labs (all labs ordered are listed, but only abnormal results are displayed) Labs Reviewed  COMPREHENSIVE METABOLIC PANEL - Abnormal; Notable for the following:       Result Value   Glucose, Bld 243 (*)    BUN 21 (*)    Creatinine, Ser 1.64 (*)    Total Protein 6.0 (*)    Albumin 3.3 (*)    Alkaline Phosphatase 127 (*)    GFR calc non Af Amer 41 (*)    GFR calc Af Amer 47 (*)    All other components within normal limits  CBC - Abnormal; Notable for the following:    Hemoglobin 10.9 (*)    HCT 32.9 (*)    All other components within normal limits  URINALYSIS, ROUTINE W REFLEX MICROSCOPIC - Abnormal; Notable for the following:    Glucose, UA >=500 (*)    Hgb urine dipstick LARGE (*)    Protein, ur 100 (*)    Bacteria, UA RARE (*)    Squamous Epithelial / LPF 0-5 (*)    All other components within normal limits  WET PREP, GENITAL  LIPASE, BLOOD  POC URINE PREG, ED  GC/CHLAMYDIA PROBE AMP () NOT AT [facility]    Radiology No results found. Consult with Dr. Harolyn Rutherford and will remove the IUD and have patient f/u with Dr. Jodi Mourning.   Procedures:  using ring forceps IUD removed without difficulty.   Procedures (including critical care time)  Medications Ordered in ED Medications - No data to display   Initial Impression / Assessment and Plan / ED Course  I have reviewed the triage vital signs and the nursing notes.  Pertinent labs & imaging results that were available during my care of the patient were reviewed by me and considered in my medical decision making (see chart for details).    Final Clinical Impressions(s) / ED Diagnoses  31 y.o. female with vaginal bleeding stable for d/c after IUD removed and bleeding decreased. Discussed with the patient clinical, lab and ultrasound findings and plan of care. All questioned fully answered. She will f/u with Dr. Jodi Mourning or go to [facility] if any problems arise.   Final diagnoses:  Abnormal vaginal bleeding  Vaginal bleeding  Displacement of intrauterine contraceptive device, initial encounter    New Prescriptions Discharge Medication List as of 01/19/2017  4:02 AM       Ashley Murrain, NP 01/22/17 [place], DO 01/22/17 2350

## 2017-01-22 NOTE — Progress Notes (Signed)
Subjective: 31 year old female presents the office today for concerns of calluses, thick skin to the palms of both of her big toes. She also states her nails are somewhat discolored denies any pain or drainage or any swelling. She does state that about a month ago she had a wound develop on her left foot second metatarsal 1 and she was treated by her primary care physician and she had a culture done that did reveal MRSA but this eventually healed. Her last A1c was 9. Denies any systemic complaints such as fevers, chills, nausea, vomiting. No acute changes since last appointment, and no other complaints at this time.   Objective: AAO x3, NAD DP/PT pulses palpable bilaterally, CRT less than 3 seconds Right foot submetatarsal one hyperkeratotic lesion. Upon debridement there is no underlying ulceration, drainage or any signs of infection. Also pre-ulcerative area left foot second metatarsal 1 with no underlying ulceration, drainage there is no signs of infection today. Nails very dystrophic, discolored yellow-brown discoloration. Thisand redness or drainage from the toe nail sites.  No open lesions or pre-ulcerative lesions.  No pain with calf compression, swelling, warmth, erythema  Assessment: Pre-ulcerative lesions bilateral sub-metatarsal 1, onychomycosis  Plan: -All treatment options discussed with the patient including all alternatives, risks, complications.  -Lesions are sharply debrided to without complications or bleeding. No underlying ulceration site. This no signs of infection. Discussed that she can use moisturizer to these areas daily. -A compound cream through [facility] for onychomycosis -Daily foot inspection.  -RTC as scheduled or sooner if any issues arise.  -Patient encouraged to call the office with any questions, concerns, change in symptoms.   Celesta Gentile, DPM

## 2017-02-12 ENCOUNTER — Telehealth: Payer: Self-pay | Admitting: Family Medicine

## 2017-02-12 ENCOUNTER — Other Ambulatory Visit: Payer: Self-pay | Admitting: Family Medicine

## 2017-02-12 MED ORDER — ALBUTEROL SULFATE HFA 108 (90 BASE) MCG/ACT IN AERS: INHALATION_SPRAY | RESPIRATORY_TRACT | 3 refills | Status: DC

## 2017-02-12 NOTE — Telephone Encounter (Signed)
This was sent in this am 

## 2017-02-12 NOTE — Telephone Encounter (Signed)
Pt needs proair called in to [facility] at [address].

## 2017-02-22 ENCOUNTER — Telehealth: Payer: Self-pay | Admitting: Physician Assistant

## 2017-02-22 MED ORDER — TOLTERODINE TARTRATE ER 4 MG PO CP24: 4.0000 mg | ORAL_CAPSULE | Freq: Every day | ORAL | 5 refills | Status: DC

## 2017-02-22 NOTE — Telephone Encounter (Signed)
Pls see note below and advise

## 2017-02-22 NOTE — Telephone Encounter (Signed)
Rx sent to pharmacy lvm for pt

## 2017-02-22 NOTE — Telephone Encounter (Signed)
Pt wants to know if we can up the dosage of tolterodine and send in new [facility] [address].

## 2017-02-22 NOTE — Telephone Encounter (Signed)
Can increase dose to 4 mg daily #30+5

## 2017-03-29 DIAGNOSIS — M79676 Pain in unspecified toe(s): Secondary | ICD-10-CM

## 2017-04-05 LAB — HEMOGLOBIN A1C: Hemoglobin A1C: 9

## 2017-04-20 ENCOUNTER — Ambulatory Visit: Payer: BLUE CROSS/BLUE SHIELD | Admitting: Podiatry

## 2017-05-23 ENCOUNTER — Encounter: Payer: Self-pay | Admitting: Family Medicine

## 2017-06-14 ENCOUNTER — Ambulatory Visit (INDEPENDENT_AMBULATORY_CARE_PROVIDER_SITE_OTHER): Payer: Medicaid Other | Admitting: Physician Assistant

## 2017-06-14 ENCOUNTER — Encounter: Payer: Self-pay | Admitting: Physician Assistant

## 2017-06-14 ENCOUNTER — Telehealth: Payer: Self-pay | Admitting: Family Medicine

## 2017-06-14 VITALS — BP 141/96 | HR 104 | Temp 98.4°F | Resp 16 | Ht 66.0 in | Wt 209.0 lb

## 2017-06-14 DIAGNOSIS — F322 Major depressive disorder, single episode, severe without psychotic features: Secondary | ICD-10-CM

## 2017-06-14 DIAGNOSIS — E1065 Type 1 diabetes mellitus with hyperglycemia: Secondary | ICD-10-CM | POA: Diagnosis not present

## 2017-06-14 DIAGNOSIS — Z22322 Carrier or suspected carrier of Methicillin resistant Staphylococcus aureus: Secondary | ICD-10-CM

## 2017-06-14 DIAGNOSIS — F332 Major depressive disorder, recurrent severe without psychotic features: Secondary | ICD-10-CM | POA: Diagnosis not present

## 2017-06-14 DIAGNOSIS — E108 Type 1 diabetes mellitus with unspecified complications: Secondary | ICD-10-CM

## 2017-06-14 DIAGNOSIS — IMO0002 Reserved for concepts with insufficient information to code with codable children: Secondary | ICD-10-CM

## 2017-06-14 DIAGNOSIS — F411 Generalized anxiety disorder: Secondary | ICD-10-CM | POA: Diagnosis not present

## 2017-06-14 MED ORDER — SULFAMETHOXAZOLE-TRIMETHOPRIM 800-160 MG PO TABS: 1.0000 | ORAL_TABLET | Freq: Two times a day (BID) | ORAL | 0 refills | Status: DC

## 2017-06-14 MED ORDER — LURASIDONE HCL 80 MG PO TABS: 80.0000 mg | ORAL_TABLET | Freq: Every day | ORAL | 2 refills | Status: DC

## 2017-06-14 NOTE — Telephone Encounter (Signed)
Call placed to patient to inquire.   States that she has abscess like area to abdomen, and new wound to great toe.   Advised that OV will be required.   Appointment scheduled with PA.

## 2017-06-14 NOTE — Telephone Encounter (Signed)
New Message  Pt voiced another MRSA spot appeared and wondering if we can send another antibiotic to the pharmacy at [address]

## 2017-06-14 NOTE — Progress Notes (Signed)
`    Patient ID: [name] MRN: [id number], DOB: 11-02-1985, 31 y.o. Date of Encounter: 06/14/2017, 3:54 PM    Chief Complaint:  Chief Complaint  Patient presents with  . Spot on right foot     HPI: 31 y.o. year old female presents with above.  She has h/o Type 1 IDDM and h/o recurrent skin infections that have been culture + for MRSA in the past.   Therefore in the past she has been informed that anytime she has any bump on the skin/abscess to have it treated appropriately.  She reports that she recently developed a bump on the top of her right first toe.  Says that at home she drained it and got out drainage.  However not completely resolving so called to get antibiotics and was told needed OV.  She also has a very tiny dot on her abdomen and can feel small amount of firmness underneath.   She also is asking if I can refill her Latuda temporarily. Reports that she was on her husband's insurance and he lost his job so she had to get on Medicaid.  However he now has a new job that started last week and she will be getting on his commercial insurance soon.  She reports that her psychiatrist does not accept Medicaid.  Reports that the [place] is extremely expensive if purchased without insurance. Is requesting that I send in refill on this until she can see her psychiatrist when she is back on commercial insurance. Reports that her mood has been stable and her anxiety and depression have been controlled and stable on current medications.     Home Meds:   Outpatient Medications Prior to Visit  Medication Sig Dispense Refill  . albuterol (PROAIR HFA) 108 (90 Base) MCG/ACT inhaler INHALE 2 PUFFS INTO LUNGS EVERY 6 HOURS AS NEEDED FOR SHORTNESS OF BREATH 1 Inhaler 3  . albuterol (PROVENTIL) (2.5 MG/3ML) 0.083% nebulizer solution Take 3 mLs (2.5 mg total) by nebulization every 4 (four) hours as needed for wheezing or shortness of breath. 30 vial 1  . furosemide (LASIX) 40 MG  tablet Take 40 mg by mouth. Every other day    . Insulin Human (INSULIN PUMP) SOLN Inject 1 each into the skin 3 times daily with meals, bedtime and 2 AM. 38 units/day basal; bolus sliding scale    . lamoTRIgine (LAMICTAL) 150 MG tablet Take 150 mg by mouth daily.    . metoCLOPramide (REGLAN) 5 MG tablet Take 1 tablet (5 mg total) by mouth 3 (three) times daily before meals. 90 tablet 0  . mometasone-formoterol (DULERA) 100-5 MCG/ACT AERO Inhale 2 puffs into the lungs 2 (two) times daily. 1 Inhaler 5  . montelukast (SINGULAIR) 10 MG tablet Take 1 tablet (10 mg total) by mouth at bedtime. 30 tablet 11  . NONFORMULARY OR COMPOUNDED ITEM [facility]  Onychomycosis Nail Lacquer -  Fluconazole 2%, Terbinafine 1% DMSO Apply to affected nail once daily Qty. 120 gm 3 refills    . tolterodine (DETROL LA) 4 MG 24 hr capsule Take 1 capsule (4 mg total) by mouth daily. 30 capsule 5  . traZODone (DESYREL) 50 MG tablet Take 50 mg by mouth at bedtime.   0  . valACYclovir (VALTREX) 1000 MG tablet 2 tabs po bid x 1 prn breakout 30 tablet 3  . enalapril (VASOTEC) 10 MG tablet Take 10 mg by mouth daily.    Marland Kitchen HYDROcodone-homatropine (HYCODAN) 5-1.5 MG/5ML syrup Take 5 mLs by mouth  every 6 (six) hours as needed for cough. 120 mL 0  . Lurasidone HCl (LATUDA) 60 MG TABS Take by mouth.     No facility-administered medications prior to visit.     Allergies:  Allergies  Allergen Reactions  . Ceftin Rash      Review of Systems: See HPI for pertinent ROS. All other ROS negative.    Physical Exam: Blood pressure (!) 141/96, pulse (!) 104, temperature 98.4 F (36.9 C), temperature source Oral, resp. rate 16, height 5\' 6"  (1.676 m), weight 209 lb (94.8 kg), SpO2 98 %., Body mass index is 33.73 kg/m. General:  WF. Appears in no acute distress. Neck: Supple. No thyromegaly. No lymphadenopathy. Lungs: Clear bilaterally to auscultation without wheezes, rales, or rhonchi. Breathing is unlabored. Heart:  Regular rhythm. No murmurs, rubs, or gallops. Msk:  Strength and tone normal for age. Skin: Right 1st Toe--Dorsal surface---34mm open area--in center of 1cm diameter area of mild erythema. No residual firmness, no residual abscess.  Abdomen---there is a "dot"--looks like a needle prick---there is no erythema of the surrounding skin. There is very small area of firmness underlying this Neuro: Alert and oriented X 3. Moves all extremities spontaneously. Gait is normal. CNII-XII grossly in tact. Psych:  Responds to questions appropriately with a normal affect.     ASSESSMENT AND PLAN:  31 y.o. year old female with  1. MRSA (methicillin resistant staph aureus) culture positive There is no drainage present to do a culture today.  Her last culture was on 12/19/16 and did show MRSA but this was sensitive to Bactrim at that time so will use Bactrim again today. She is to take the Bactrim as directed and complete all of it. Follow-up if site worsens or is not resolved upon completion of Bactrim. - sulfamethoxazole-trimethoprim (BACTRIM DS,SEPTRA DS) 800-160 MG tablet; Take 1 tablet by mouth 2 (two) times daily.  Dispense: 14 tablet; Refill: 0  2. Severe episode of recurrent major depressive disorder, without psychotic features ([place]) I did send refill of the Latuda to hold her over until she gets commercial insurance back and can see her psychiatrist for follow-up visit.  3. Generalized anxiety disorder I did send refill of the Latuda to hold her over until she gets commercial insurance back and can see her psychiatrist for follow-up visit.   4. MDD (major depressive disorder), severe ([place]) I did send refill of the Latuda to hold her over until she gets commercial insurance back and can see her psychiatrist for follow-up visit.   5. Type I diabetes mellitus with complication, uncontrolled ([place])    Signed, [address] [place], [place], [facility] 06/14/2017 3:54 PM

## 2017-06-15 DIAGNOSIS — H5231 Anisometropia: Secondary | ICD-10-CM | POA: Diagnosis not present

## 2017-06-15 DIAGNOSIS — H50111 Monocular exotropia, right eye: Secondary | ICD-10-CM | POA: Diagnosis not present

## 2017-06-15 DIAGNOSIS — H2701 Aphakia, right eye: Secondary | ICD-10-CM | POA: Diagnosis not present

## 2017-06-18 ENCOUNTER — Other Ambulatory Visit: Payer: Self-pay | Admitting: Family Medicine

## 2017-07-11 ENCOUNTER — Other Ambulatory Visit: Payer: Self-pay | Admitting: Family Medicine

## 2017-07-12 NOTE — Telephone Encounter (Signed)
Refill appropriate 

## 2017-09-28 ENCOUNTER — Telehealth: Payer: Self-pay | Admitting: Family Medicine

## 2017-09-28 ENCOUNTER — Other Ambulatory Visit: Payer: Self-pay | Admitting: Family Medicine

## 2017-09-28 MED ORDER — AMOXICILLIN 875 MG PO TABS: 875.0000 mg | ORAL_TABLET | Freq: Two times a day (BID) | ORAL | 0 refills | Status: DC

## 2017-09-28 NOTE — Telephone Encounter (Signed)
Per Dr. Dennard Schaumann ok to do Amoxicillin - med sent to pharm. Pt aware via vm.

## 2017-09-28 NOTE — Telephone Encounter (Signed)
[facility]  Patient says her daughter was diagnosed with strep yesterday with another doctor, she would like to know since there are no appointments and her throat is sore could something be called in for her?

## 2017-09-29 ENCOUNTER — Emergency Department (HOSPITAL_COMMUNITY)
Admission: EM | Admit: 2017-09-29 | Discharge: 2017-09-29 | Disposition: A | Discharge status: Home / Self Care | Payer: Medicaid Other | Attending: Emergency Medicine | Admitting: Emergency Medicine

## 2017-09-29 ENCOUNTER — Emergency Department (HOSPITAL_COMMUNITY): Payer: Medicaid Other

## 2017-09-29 ENCOUNTER — Encounter (HOSPITAL_COMMUNITY): Payer: Self-pay | Admitting: Emergency Medicine

## 2017-09-29 DIAGNOSIS — E109 Type 1 diabetes mellitus without complications: Secondary | ICD-10-CM | POA: Insufficient documentation

## 2017-09-29 DIAGNOSIS — Z794 Long term (current) use of insulin: Secondary | ICD-10-CM | POA: Insufficient documentation

## 2017-09-29 DIAGNOSIS — R1031 Right lower quadrant pain: Secondary | ICD-10-CM | POA: Diagnosis not present

## 2017-09-29 DIAGNOSIS — Z79899 Other long term (current) drug therapy: Secondary | ICD-10-CM | POA: Insufficient documentation

## 2017-09-29 DIAGNOSIS — R350 Frequency of micturition: Secondary | ICD-10-CM | POA: Diagnosis not present

## 2017-09-29 DIAGNOSIS — Z87442 Personal history of urinary calculi: Secondary | ICD-10-CM | POA: Insufficient documentation

## 2017-09-29 DIAGNOSIS — I10 Essential (primary) hypertension: Secondary | ICD-10-CM | POA: Insufficient documentation

## 2017-09-29 DIAGNOSIS — R110 Nausea: Secondary | ICD-10-CM | POA: Insufficient documentation

## 2017-09-29 DIAGNOSIS — R1032 Left lower quadrant pain: Secondary | ICD-10-CM | POA: Diagnosis not present

## 2017-09-29 DIAGNOSIS — J45909 Unspecified asthma, uncomplicated: Secondary | ICD-10-CM | POA: Insufficient documentation

## 2017-09-29 HISTORY — DX: Bipolar disorder, unspecified: F31.9

## 2017-09-29 LAB — COMPREHENSIVE METABOLIC PANEL
ALBUMIN: 3.6 g/dL (ref 3.5–5.0)
ALK PHOS: 107 U/L (ref 38–126)
ALT: 19 U/L (ref 14–54)
ANION GAP: 7 (ref 5–15)
AST: 20 U/L (ref 15–41)
BILIRUBIN TOTAL: 0.5 mg/dL (ref 0.3–1.2)
BUN: 15 mg/dL (ref 6–20)
CHLORIDE: 106 mmol/L (ref 101–111)
CO2: 24 mmol/L (ref 22–32)
CREATININE: 1.62 mg/dL — AB (ref 0.44–1.00)
Calcium: 8.8 mg/dL — ABNORMAL LOW (ref 8.9–10.3)
GFR calc non Af Amer: 41 mL/min — ABNORMAL LOW (ref >60)
GFR, EST AFRICAN AMERICAN: 48 mL/min — AB (ref >60)
Glucose, Bld: 65 mg/dL (ref 65–99)
POTASSIUM: 3.8 mmol/L (ref 3.5–5.1)
SODIUM: 137 mmol/L (ref 135–145)
Total Protein: 6 g/dL — ABNORMAL LOW (ref 6.5–8.1)

## 2017-09-29 LAB — URINALYSIS, ROUTINE W REFLEX MICROSCOPIC
Bilirubin Urine: NEGATIVE
Glucose, UA: NEGATIVE mg/dL
Ketones, ur: NEGATIVE mg/dL
Leukocytes, UA: NEGATIVE
Nitrite: NEGATIVE
PH: 5 (ref 5.0–8.0)
Protein, ur: 100 mg/dL — AB
SPECIFIC GRAVITY, URINE: 1.003 — AB (ref 1.005–1.030)

## 2017-09-29 LAB — I-STAT BETA HCG BLOOD, ED (MC, WL, AP ONLY)
I-stat hCG, quantitative: <5 m[IU]/mL (ref <5)
I-stat hCG, quantitative: <5 m[IU]/mL (ref <5)

## 2017-09-29 LAB — LIPASE, BLOOD: Lipase: 23 U/L (ref 11–51)

## 2017-09-29 LAB — CBG MONITORING, ED: Glucose-Capillary: 74 mg/dL (ref 65–99)

## 2017-09-29 LAB — CBC
HCT: 35.4 % — ABNORMAL LOW (ref 36.0–46.0)
Hemoglobin: 11.7 g/dL — ABNORMAL LOW (ref 12.0–15.0)
MCH: 28.3 pg (ref 26.0–34.0)
MCHC: 33.1 g/dL (ref 30.0–36.0)
MCV: 85.7 fL (ref 78.0–100.0)
PLATELETS: 322 10*3/uL (ref 150–400)
RBC: 4.13 MIL/uL (ref 3.87–5.11)
RDW: 13.9 % (ref 11.5–15.5)
WBC: 6.6 10*3/uL (ref 4.0–10.5)

## 2017-09-29 MED ORDER — IOPAMIDOL (ISOVUE-300) INJECTION 61%
INTRAVENOUS | Status: AC
  Administered 2017-09-29: 75 mL
  Filled 2017-09-29: qty 75

## 2017-09-29 MED ORDER — ONDANSETRON HCL 4 MG PO TABS: 4.0000 mg | ORAL_TABLET | Freq: Four times a day (QID) | ORAL | 0 refills | Status: DC

## 2017-09-29 MED ORDER — HYDROCODONE-ACETAMINOPHEN 5-325 MG PO TABS: 1.0000 | ORAL_TABLET | Freq: Four times a day (QID) | ORAL | 0 refills | Status: DC | PRN

## 2017-09-29 MED ORDER — ONDANSETRON HCL 4 MG/2ML IJ SOLN
4.0000 mg | Freq: Once | INTRAMUSCULAR | Status: AC
  Administered 2017-09-29: 4 mg via INTRAVENOUS
  Filled 2017-09-29: qty 2

## 2017-09-29 MED ORDER — DEXTROSE 50 % IV SOLN
1.0000 | Freq: Once | INTRAVENOUS | Status: DC
  Filled 2017-09-29: qty 50

## 2017-09-29 NOTE — ED Triage Notes (Signed)
Patient reports intermittent RLQ pain with nausea onset this morning 1 am , denies emesis or diarrhea , no fever or chills .

## 2017-09-29 NOTE — ED Notes (Signed)
CBG: 39. RN notified.

## 2017-09-29 NOTE — ED Provider Notes (Signed)
[facility] EMERGENCY DEPARTMENT Provider Note   CSN: [id number] Arrival date & time: 09/29/17  0441     History   Chief Complaint Chief Complaint  Patient presents with  . Abdominal Pain    HPI [name] is a 31 y.o. female with history of type 1 diabetes, gastroparesis, distant history of kidney stones who presents with a 1 day history of right lower quadrant pain.  Patient reports she initially began with the pain when she was urinating yesterday.  She has had pain in her RLQ and R side with urinating. She reports she has had some urinary frequency.  She has had some radiation of pain to her right flank and intermittent low back pain. It is worse with movement. She has had some nausea, but no vomiting.  She denies diarrhea.  She did not take any medications at home for symptoms.  Patient reports it has been many years since she has had kidney stones.  She denies any urgency or dysuria.  Patient also denies any abnormal vaginal bleeding or discharge.  She denies any concern for STD exposure. LMP 09/09/2017.  HPI  Past Medical History:  Diagnosis Date  . Anxiety   . Asthma   . Asthma 11/10/2013  . Bipolar 1 disorder ([place])   . Depression   . Diabetic gastroparesis ([place])   . Diabetic neuropathy, type I diabetes mellitus ([place])   . Headache(784.0)   . Hypertension   . Kidney stones   . Polyneuropathy in diabetes(357.2)   . 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[place])   . Tachycardia    baseline tachycardia   . 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 DM retinopathy ([place])   . Encounter for preconception consultation   . MDD (major depressive disorder), severe ([place]) 05/31/2014  . Drug overdose, intentional ([place]) 03/27/2014  . Asthma 11/10/2013  . Colles' fracture of left radius 10/11/2013  . Type I diabetes mellitus with complication, uncontrolled ([place])  10/11/2013  . MDD (major depressive disorder), recurrent episode, severe ([place]) 09/26/2013  . Generalized anxiety disorder 09/26/2013  . Hyperglycemia 04/30/2012  . Diabetic retinopathy associated with type 1 diabetes mellitus ([place]) 04/30/2012  . Polyneuropathy in diabetes(357.2) 04/30/2012  . H/O insertion of insulin pump 04/30/2012  . CHOLELITHIASIS 08/10/2010  . ATRIAL TACHYCARDIA 08/08/2010  . Gastroparesis 08/08/2010  . LIVER FUNCTION TESTS, ABNORMAL, HX OF 08/08/2010  . Type 1 diabetes mellitus with ketoacidosis, uncontrolled ([place]) 08/02/2010  . BOWEL INTUSSUSCEPTION 08/02/2010  . PANCREATITIS, ACUTE, HX OF 08/02/2010  . ABDOMINAL PAIN, LEFT UPPER QUADRANT 08/01/2010    Past Surgical History:  Procedure Laterality Date  . CESAREAN SECTION    . CHOLECYSTECTOMY    . REFRACTIVE SURGERY      OB History    Gravida Para Term Preterm AB Living   2 1       1    SAB TAB Ectopic Multiple Live Births           1       Home Medications    Prior to Admission medications   Medication Sig Start Date End Date Taking? Authorizing Provider  albuterol (PROVENTIL) (2.5 MG/3ML) 0.083% nebulizer solution Take 3 mLs (2.5 mg total) by nebulization every 4 (four) hours as needed for wheezing or shortness of breath. 11/10/16 05/06/18  [name], MD  amoxicillin (AMOXIL) 875 MG tablet Take 1 tablet (  875 mg total) by mouth 2 (two) times daily. 09/28/17   [name], MD  enalapril (VASOTEC) 20 MG tablet Take 20 mg by mouth. 05/03/17 05/03/18  [provider]  furosemide (LASIX) 40 MG tablet Take 40 mg by mouth. Every other day    [provider]  HYDROcodone-acetaminophen (NORCO/VICODIN) 5-325 MG tablet Take 1-2 tablets by mouth every 6 (six) hours as needed. 09/29/17   Tashana Haberl, Bea Graff, PA-C  Insulin Human (INSULIN PUMP) SOLN Inject 1 each into the skin 3 times daily with meals, bedtime and 2 AM. 38 units/day basal; bolus sliding scale    [provider]    lamoTRIgine (LAMICTAL) 150 MG tablet Take 150 mg by mouth daily.    [provider]  lurasidone (LATUDA) 80 MG TABS tablet Take 1 tablet (80 mg total) by mouth daily with breakfast. 06/14/17   Orlena Sheldon, PA-C  metoCLOPramide (REGLAN) 5 MG tablet Take 1 tablet (5 mg total) by mouth 3 (three) times daily before meals. 12/13/16   Doran Stabler, MD  mometasone-formoterol (DULERA) 100-5 MCG/ACT AERO Inhale 2 puffs into the lungs 2 (two) times daily. 11/10/16   [name], MD  montelukast (SINGULAIR) 10 MG tablet TAKE 1 TABLET BY MOUTH ONCE DAILY AT BEDTIME 07/12/17   [name], MD  NONFORMULARY OR COMPOUNDED ITEM [facility]  Onychomycosis Nail Lacquer -  Fluconazole 2%, Terbinafine 1% DMSO Apply to affected nail once daily Qty. 120 gm 3 refills    [provider]  ondansetron (ZOFRAN) 4 MG tablet Take 1 tablet (4 mg total) by mouth every 6 (six) hours. 09/29/17   Garrie Woodin, Bea Graff, PA-C  PROAIR HFA 108 (90 Base) MCG/ACT inhaler INHALE 2 PUFFS BY MOUTH EVERY 6 HOURS AS NEEDED FOR SHORTNESS OF BREATH 07/12/17   [name], MD  sulfamethoxazole-trimethoprim (BACTRIM DS,SEPTRA DS) 800-160 MG tablet Take 1 tablet by mouth 2 (two) times daily. 06/14/17   Orlena Sheldon, PA-C  tolterodine (DETROL LA) 4 MG 24 hr capsule Take 1 capsule (4 mg total) by mouth daily. 02/22/17   Orlena Sheldon, PA-C  traZODone (DESYREL) 50 MG tablet Take 50 mg by mouth at bedtime.  11/30/16   [provider]    Family History Family History  Problem Relation Age of Onset  . Hypertension Unknown     Social History Social History   Tobacco Use  . Smoking status: Never Smoker  . Smokeless tobacco: Never Used  Substance Use Topics  . Alcohol use: No  . Drug use: No     Allergies   Ceftin   Review of Systems Review of Systems  Constitutional: Negative for chills and fever.  HENT: Negative for facial swelling and sore throat.   Respiratory: Negative for  shortness of breath.   Cardiovascular: Negative for chest pain.  Gastrointestinal: Positive for abdominal pain and nausea. Negative for vomiting.  Genitourinary: Positive for frequency. Negative for dysuria.  Musculoskeletal: Negative for back pain.  Skin: Negative for rash and wound.  Neurological: Negative for headaches.  Psychiatric/Behavioral: The patient is not nervous/anxious.      Physical Exam Updated Vital Signs BP 128/77   Pulse 96   Temp 98 F (36.7 C) (Oral)   Resp 16   Ht 5\' 6"  (1.676 m)   Wt 84.8 kg (187 lb)   LMP 09/09/2017 (Approximate)   SpO2 100%   BMI 30.18 kg/m   Physical Exam  Constitutional: She appears well-developed and well-nourished.  No distress.  Very well appearing, sitting comfortably on the bed in no distress  HENT:  Head: Normocephalic and atraumatic.  Mouth/Throat: Oropharynx is clear and moist. No oropharyngeal exudate.  Eyes: Conjunctivae are normal. Pupils are equal, round, and reactive to light. Right eye exhibits no discharge. Left eye exhibits no discharge. No scleral icterus.  Neck: Normal range of motion. Neck supple. No thyromegaly present.  Cardiovascular: Normal rate, regular rhythm, normal heart sounds and intact distal pulses. Exam reveals no gallop and no friction rub.  No murmur heard. Pulmonary/Chest: Effort normal and breath sounds normal. No stridor. No respiratory distress. She has no wheezes. She has no rales.  Abdominal: Soft. Bowel sounds are normal. She exhibits no distension. There is tenderness in the right lower quadrant and left lower quadrant. There is tenderness at McBurney's point. There is no rebound, no guarding and no CVA tenderness.    Positive Rovsing's negative obdurator  Musculoskeletal: She exhibits no edema.       Back:  Lymphadenopathy:    She has no cervical adenopathy.  Neurological: She is alert. Coordination normal.  Skin: Skin is warm and dry. No rash noted. She is not diaphoretic. No pallor.   Psychiatric: She has a normal mood and affect.  Nursing note and vitals reviewed.    ED Treatments / Results  Labs (all labs ordered are listed, but only abnormal results are displayed) Labs Reviewed  COMPREHENSIVE METABOLIC PANEL - Abnormal; Notable for the following components:      Result Value   Creatinine, Ser 1.62 (*)    Calcium 8.8 (*)    Total Protein 6.0 (*)    GFR calc non Af Amer 41 (*)    GFR calc Af Amer 48 (*)    All other components within normal limits  CBC - Abnormal; Notable for the following components:   Hemoglobin 11.7 (*)    HCT 35.4 (*)    All other components within normal limits  URINALYSIS, ROUTINE W REFLEX MICROSCOPIC - Abnormal; Notable for the following components:   Color, Urine STRAW (*)    Specific Gravity, Urine 1.003 (*)    Hgb urine dipstick SMALL (*)    Protein, ur 100 (*)    Bacteria, UA RARE (*)    Squamous Epithelial / LPF 0-5 (*)    All other components within normal limits  LIPASE, BLOOD  I-STAT BETA HCG BLOOD, ED ([facility], [facility], [facility] ONLY)  CBG MONITORING, ED  I-STAT BETA HCG BLOOD, ED ([facility], [facility], [facility] ONLY)    EKG  EKG Interpretation None       Radiology Ct Abdomen Pelvis W Contrast  Result Date: 09/29/2017 CLINICAL DATA:  Right lower quadrant pain radiating to the back since this morning. Nausea. EXAM: CT ABDOMEN AND PELVIS WITH CONTRAST TECHNIQUE: Multidetector CT imaging of the abdomen and pelvis was performed using the standard protocol following bolus administration of intravenous contrast. CONTRAST:  99mL ISOVUE-300 IOPAMIDOL (ISOVUE-300) INJECTION 61% COMPARISON:  MR abdomen 01/18/2011, CT abdomen 08/11/2010 FINDINGS: Lower chest: No acute abnormality. Hepatobiliary: No focal liver abnormality is seen. Status post cholecystectomy. No biliary dilatation. Pancreas: Unremarkable. No pancreatic ductal dilatation or surrounding inflammatory changes. Spleen: Normal in size without focal abnormality. Adrenals/Urinary Tract: Adrenal glands  are unremarkable. Kidneys are normal, without renal calculi, focal lesion, or hydronephrosis. Bladder is unremarkable. Stomach/Bowel: Stomach is within normal limits. Appendix appears normal. No evidence of bowel wall thickening, distention, or inflammatory changes. Multiple fluid-filled loops of small bowel and fluid in the stomach  as can be seen with gastroenteritis. No perienteric inflammatory changes. No pneumatosis, pneumoperitoneum or portal venous gas. Vascular/Lymphatic: No significant vascular findings are present. No enlarged abdominal or pelvic lymph nodes. Reproductive: Uterus and bilateral adnexa are unremarkable. Other: No abdominal wall hernia or abnormality. No abdominopelvic ascites. Musculoskeletal: No acute or significant osseous findings. IMPRESSION: 1. Multiple fluid-filled loops of small bowel and fluid in the stomach as can be seen with gastroenteritis. 2. Normal appendix. 3. No urolithiasis or obstructive uropathy. Electronically Signed   By: Kathreen Devoid   On: 09/29/2017 09:34    Procedures Procedures (including critical care time)  Medications Ordered in ED Medications  dextrose 50 % solution 50 mL (0 mLs Intravenous Hold 09/29/17 0800)  ondansetron (ZOFRAN) injection 4 mg (4 mg Intravenous Given 09/29/17 0800)  iopamidol (ISOVUE-300) 61 % injection (75 mLs  Contrast Given 09/29/17 0911)     Initial Impression / Assessment and Plan / ED Course  I have reviewed the triage vital signs and the nursing notes.  Pertinent labs & imaging results that were available during my care of the patient were reviewed by me and considered in my medical decision making (see chart for details).     Suspect kidney stone versus gastroenteritis.  CT abdomen pelvis shows fluid-filled loops of small bowel and fluid in the stomach which can be seen with gastroenteritis; normal appendix. No urolithiasis or hydronephrosis and the contrast study, however small stone may be obscured.  Considering  patient's urinary symptoms, will treat for kidney stone.  Using shared decision-making, I discussed the possibilities of any pelvic etiologies including ovarian torsion, although less likely considering patient's level of comfort and symptomatology, and patient would like to defer pelvic exam and further testing at this time.  She will be referred to urology and PCP.  Patient encouraged to strain urine.  Strict return precautions discussed.  Will discharge home with Norco, Zofran.  I reviewed the [place] narcotic database and found no discrepancies.  Patient understands and agrees with plan.  Patient vitals stable and discharged in satisfactory condition.  Final Clinical Impressions(s) / ED Diagnoses   Final diagnoses:  Right lower quadrant abdominal pain    ED Discharge Orders        Ordered    ondansetron (ZOFRAN) 4 MG tablet  Every 6 hours     09/29/17 1018    HYDROcodone-acetaminophen (NORCO/VICODIN) 5-325 MG tablet  Every 6 hours PRN     09/29/17 [address], [place] 37/90/24 0973    Delora Fuel, MD 53/29/92 2252

## 2017-09-29 NOTE — Discharge Instructions (Signed)
Medications: Norco, Zofran  Treatment: Take 1-2 Norco every 4-6 hours as needed for severe pain.  Take Zofran every 6 hours as needed for nausea or vomiting.  Make sure to drink plenty of fluids.  Strain your urine and bring stone to urologist if it is passed.  Follow-up: Please follow-up with urology within 1 week.  Please also follow-up with your primary care provider if your symptoms are continuing.  Please return to emergency department if you develop any new or worsening symptoms including fever of 100.4, much worse, severe localized abdominal pain, intractable vomiting, or any other concerning symptom.

## 2017-10-23 ENCOUNTER — Ambulatory Visit: Payer: Medicaid Other | Admitting: Family Medicine

## 2017-10-23 ENCOUNTER — Encounter: Payer: Self-pay | Admitting: Family Medicine

## 2017-10-23 VITALS — BP 128/84 | HR 94 | Temp 98.4°F | Resp 16 | Ht 66.0 in | Wt 183.0 lb

## 2017-10-23 DIAGNOSIS — E1040 Type 1 diabetes mellitus with diabetic neuropathy, unspecified: Secondary | ICD-10-CM | POA: Diagnosis not present

## 2017-10-23 DIAGNOSIS — G5603 Carpal tunnel syndrome, bilateral upper limbs: Secondary | ICD-10-CM | POA: Diagnosis not present

## 2017-10-23 DIAGNOSIS — E1065 Type 1 diabetes mellitus with hyperglycemia: Secondary | ICD-10-CM | POA: Diagnosis not present

## 2017-10-23 NOTE — Progress Notes (Signed)
Subjective:    Patient ID: [name], female    DOB: 1986-07-10, 32 y.o.   MRN: [id number]  HPI Patient has a long-standing history of type 1 diabetes mellitus that has been poorly controlled in the past.  However recently her hemoglobin A1c was 7.2.  She has neuropathy in both feet.  She believes that the neuropathy is getting worse now and involving her hands.  However her hands are asymmetric.  The left hand is worse than the right hand.  It involves the tips of the fingers.  They are numb.  However not all the fingers are involved.  The fifth digit on both hands is spared.  She denies any loss in grip strength.  She has a positive Tinel sign.  She also has a positive Phalen sign.  Both exacerbate her symptoms.  She denies any injury recently although she broke her left wrist in the past which may explain the asymmetry.  She has normal sensation to 10 g monofilament.  She has normal grip strength and normal blood flow to the hand.  There is no visible deformity in the hand Past Medical History:  Diagnosis Date  . Anxiety   . Asthma   . Asthma 11/10/2013  . Bipolar 1 disorder ([place])   . Depression   . Diabetic gastroparesis ([place])   . Diabetic neuropathy, type I diabetes mellitus ([place])   . Headache(784.0)   . Hypertension   . Kidney stones   . Polyneuropathy in diabetes(357.2)   . 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[place])   . Tachycardia    baseline tachycardia   . 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 [facility])    Past Surgical History:  Procedure Laterality Date  . CESAREAN SECTION    . CHOLECYSTECTOMY    . REFRACTIVE SURGERY     Current Outpatient Medications on File Prior to Visit  Medication Sig Dispense Refill  . albuterol (PROVENTIL) (2.5 MG/3ML) 0.083% nebulizer solution Take 3 mLs (2.5 mg total) by nebulization every 4 (four) hours as needed for wheezing or shortness of breath. 30 vial 1  . enalapril (VASOTEC) 20 MG tablet Take 20 mg  by mouth.    . furosemide (LASIX) 40 MG tablet Take 40 mg by mouth. Every other day    . Insulin Human (INSULIN PUMP) SOLN Inject 1 each into the skin 3 times daily with meals, bedtime and 2 AM. 38 units/day basal; bolus sliding scale    . lamoTRIgine (LAMICTAL) 200 MG tablet Take 400 mg by mouth daily.    Marland Kitchen lurasidone (LATUDA) 80 MG TABS tablet Take 1 tablet (80 mg total) by mouth daily with breakfast. (Patient taking differently: Take 120 mg by mouth daily with breakfast. ) 30 tablet 2  . metoCLOPramide (REGLAN) 5 MG tablet Take 1 tablet (5 mg total) by mouth 3 (three) times daily before meals. 90 tablet 0  . mometasone-formoterol (DULERA) 100-5 MCG/ACT AERO Inhale 2 puffs into the lungs 2 (two) times daily. 1 Inhaler 5  . montelukast (SINGULAIR) 10 MG tablet TAKE 1 TABLET BY MOUTH ONCE DAILY AT BEDTIME 90 tablet 9  . NONFORMULARY OR COMPOUNDED ITEM [facility]  Onychomycosis Nail Lacquer -  Fluconazole 2%, Terbinafine 1% DMSO Apply to affected nail once daily Qty. 120 gm 3 refills    . ondansetron (ZOFRAN) 4 MG tablet Take 1 tablet (4 mg total) by mouth every 6 (six) hours. 12 tablet 0  . PROAIR HFA 108 (90 Base) MCG/ACT  inhaler INHALE 2 PUFFS BY MOUTH EVERY 6 HOURS AS NEEDED FOR SHORTNESS OF BREATH 9 each 0  . sulfamethoxazole-trimethoprim (BACTRIM DS,SEPTRA DS) 800-160 MG tablet Take 1 tablet by mouth 2 (two) times daily. 14 tablet 0  . tolterodine (DETROL LA) 4 MG 24 hr capsule Take 1 capsule (4 mg total) by mouth daily. 30 capsule 5  . traZODone (DESYREL) 50 MG tablet Take 50 mg by mouth at bedtime.   0   No current facility-administered medications on file prior to visit.    Allergies  Allergen Reactions  . Ceftin Rash   Social History   Socioeconomic History  . Marital status: Married    Spouse name: Not on file  . Number of children: Not on file  . Years of education: Not on file  . Highest education level: Not on file  Social Needs  . Financial resource strain: Not  on file  . Food insecurity - worry: Not on file  . Food insecurity - inability: Not on file  . Transportation needs - medical: Not on file  . Transportation needs - non-medical: Not on file  Occupational History  . Not on file  Tobacco Use  . Smoking status: Never Smoker  . Smokeless tobacco: Never Used  Substance and Sexual Activity  . Alcohol use: No  . Drug use: No  . Sexual activity: Yes    Partners: Male  Other Topics Concern  . Not on file  Social History Narrative   Pt has a daughter and boyfriend.             Review of Systems  All other systems reviewed and are negative.      Objective:   Physical Exam  Cardiovascular: Normal rate, regular rhythm and normal heart sounds.  Pulmonary/Chest: Effort normal and breath sounds normal.  Musculoskeletal:       Left wrist: She exhibits decreased range of motion. She exhibits no tenderness, no swelling, no effusion and no crepitus.       Left hand: She exhibits disruption of two-point discrimination. She exhibits normal range of motion, no tenderness and no bony tenderness. Decreased sensation noted. Decreased sensation is present in the medial distribution.  Vitals reviewed.         Assessment & Plan:  Carpal tunnel syndrome on both sides  We discussed options including a cockup wrist splint, a cortisone injection, and a surgical referral.  Patient elects to try a combination of the cockup wrist splint along with a cortisone injection.  The risk and benefits were explained to the patient in detail.  Patient was placed with her wrist dorsiflexed over a pillow.  Palmaris longus tendon was isolated.  Using sterile technique a mixture of 1/2 cc of lidocaine and 1/2 cc of 40 mg/mL Kenalog was injected just to the ulnar side of the palmaris longus tendon at the distal flexor crease of the wrist.  Aspiration was performed prior to injection to ensure that injection was not being made into a blood vessel.  Patient tolerated the  procedure well without complication.  Recheck in 1 week or sooner if worse.  Recommended she wear a cockup wrist splint to help prevent recurrence

## 2017-12-27 ENCOUNTER — Other Ambulatory Visit: Payer: Self-pay | Admitting: Family Medicine

## 2017-12-27 MED ORDER — ALBUTEROL SULFATE HFA 108 (90 BASE) MCG/ACT IN AERS: INHALATION_SPRAY | RESPIRATORY_TRACT | 3 refills | Status: DC

## 2017-12-27 NOTE — Telephone Encounter (Signed)
Requesting refill on ProAir - med sent to pharm

## 2018-01-24 ENCOUNTER — Telehealth: Payer: Self-pay | Admitting: Gastroenterology

## 2018-01-24 DIAGNOSIS — E1143 Type 2 diabetes mellitus with diabetic autonomic (poly)neuropathy: Secondary | ICD-10-CM

## 2018-01-24 DIAGNOSIS — K3184 Gastroparesis: Principal | ICD-10-CM

## 2018-01-24 NOTE — Telephone Encounter (Signed)
Patient states she needs medication reglan refilled at [facility]. Patient states she takes it on as needed basis. Pt last seen 3.23.18.

## 2018-01-24 NOTE — Telephone Encounter (Signed)
Unfortunately, she cannot take metoclopramide along with lurasidone, because there is the possibility of a very serious interaction that could cause a neurological side effect.  We had talked about the risk of that even when she was on metoclopramide alone, but now that she is on lurasidone, the risk is much higher and I cannot continue the metoclopramide.  I can prescribe ondansetron.

## 2018-01-24 NOTE — Telephone Encounter (Signed)
Last seen 12-2016. No current follow up. Please advise.

## 2018-01-28 NOTE — Telephone Encounter (Signed)
Left a message to return a call

## 2018-01-30 MED ORDER — ONDANSETRON HCL 4 MG PO TABS: 4.0000 mg | ORAL_TABLET | Freq: Three times a day (TID) | ORAL | 1 refills | Status: DC | PRN

## 2018-01-30 NOTE — Telephone Encounter (Signed)
Per discussion with Maurine Minister, we sent a script for Zofran 4 mg 1 every 6 hours as needed for nausea. #[phone] [id number]

## 2018-01-30 NOTE — Telephone Encounter (Signed)
Patient returned phone call. [facility] # [phone]

## 2018-01-30 NOTE — Telephone Encounter (Signed)
Pt contacted. I revived the possible neurological interactions with the Reglan and Latuda. She would like the Rx for Zofran for now. She did schedule a follow up to discuss.

## 2018-01-31 NOTE — Telephone Encounter (Signed)
Agreed. Pharmacy will send [name] refill request when needed.

## 2018-03-07 ENCOUNTER — Encounter: Payer: Self-pay | Admitting: Gastroenterology

## 2018-03-07 ENCOUNTER — Ambulatory Visit (INDEPENDENT_AMBULATORY_CARE_PROVIDER_SITE_OTHER): Payer: Medicaid Other | Admitting: Gastroenterology

## 2018-03-07 VITALS — BP 120/80 | HR 68 | Ht 66.0 in | Wt 184.0 lb

## 2018-03-07 DIAGNOSIS — E1143 Type 2 diabetes mellitus with diabetic autonomic (poly)neuropathy: Secondary | ICD-10-CM

## 2018-03-07 DIAGNOSIS — R1115 Cyclical vomiting syndrome unrelated to migraine: Secondary | ICD-10-CM

## 2018-03-07 DIAGNOSIS — G43A0 Cyclical vomiting, not intractable: Secondary | ICD-10-CM

## 2018-03-07 DIAGNOSIS — R140 Abdominal distension (gaseous): Secondary | ICD-10-CM | POA: Diagnosis not present

## 2018-03-07 DIAGNOSIS — K3184 Gastroparesis: Secondary | ICD-10-CM | POA: Diagnosis not present

## 2018-03-07 DIAGNOSIS — K5901 Slow transit constipation: Secondary | ICD-10-CM | POA: Diagnosis not present

## 2018-03-07 MED ORDER — ONDANSETRON HCL 4 MG PO TABS: 4.0000 mg | ORAL_TABLET | Freq: Three times a day (TID) | ORAL | 2 refills | Status: DC | PRN

## 2018-03-07 MED ORDER — PROCHLORPERAZINE 25 MG RE SUPP: 25.0000 mg | Freq: Every day | RECTAL | 0 refills | Status: DC | PRN

## 2018-03-07 NOTE — Patient Instructions (Addendum)
If you are age 33 or older, your body mass index should be between 23-30. Your Body mass index is 29.7 kg/m. If this is out of the aforementioned range listed, please consider follow up with your Primary Care Provider.  If you are age 66 or younger, your body mass index should be between 19-25. Your Body mass index is 29.7 kg/m. If this is out of the aformentioned range listed, please consider follow up with your Primary Care Provider.   We have given you samples of the following medication to take: Linzess 140mcg ([id number] exp 03-2019)  It was a pleasure to see you today!  Dr. Loletha Carrow

## 2018-03-07 NOTE — Progress Notes (Signed)
[name] Progress Note  Chief Complaint: Gastroparesis  Subjective  History:  [name] follows up for her long-standing gastroparesis and chronic constipation.  She previously had poorly controlled diabetes, though reportedly improved leading up to her last visit with me in March 2018.  She had been taking intermittent dosing of metoclopramide at that time.  She called the clinic for advice last month regarding nausea and a refill of metoclopramide.  I reviewed her medication list and saw that she was on lurasidone.  I felt the neurologic side effect risk of this drug combination was too high, so I did not refill the metoclopramide, instead prescribing ondansetron.  [name] is bothered by ongoing symptoms including intermittent nausea, bloating, constipation and rectal bleeding.  She will uncommonly have vomiting.  She was not able to try ondansetron because she says the prescription was sent to the wrong pharmacy.  She will feel like she has the need for bowel movement several times a week, but sits for long period of time and strains and has difficulty passing one.  Sometimes she has had to disimpact herself.  She has rectal bleeding with nearly every bowel movement by her report.  No hemorrhoids were seen on colonoscopy.  She rarely takes MiraLAX anymore because she did not find it helpful. She is bothered by abdominal bloating distention and gas.  Diabetes has apparently been under the best control it has been in years.  ROS: Cardiovascular:  no chest pain Respiratory: no dyspnea Remainder of systems negative except as above  The patient's Past Medical, Family and Social History were reviewed and are on file in the EMR.  Objective:  Med list reviewed  Current Outpatient Medications:  .  albuterol (PROAIR HFA) 108 (90 Base) MCG/ACT inhaler, INHALE 2 PUFFS BY MOUTH EVERY 6 HOURS AS NEEDED FOR SHORTNESS OF BREATH, Disp: 18 g, Rfl: 3 .  albuterol (PROVENTIL) (2.5 MG/3ML)  0.083% nebulizer solution, Take 3 mLs (2.5 mg total) by nebulization every 4 (four) hours as needed for wheezing or shortness of breath., Disp: 30 vial, Rfl: 1 .  enalapril (VASOTEC) 10 MG tablet, Take 10 mg by mouth daily., Disp: , Rfl:  .  furosemide (LASIX) 40 MG tablet, Take 40 mg by mouth. Every other day, Disp: , Rfl:  .  Insulin Human (INSULIN PUMP) SOLN, Inject 1 each into the skin 3 times daily with meals, bedtime and 2 AM. 38 units/day basal; bolus sliding scale, Disp: , Rfl:  .  lamoTRIgine (LAMICTAL) 200 MG tablet, Take 200 mg by mouth 2 (two) times daily. , Disp: , Rfl:  .  lamoTRIgine (LAMICTAL) 25 MG tablet, Take 50 mg by mouth 2 (two) times daily., Disp: , Rfl:  .  Lurasidone HCl (LATUDA) 120 MG TABS, Take 1 tablet by mouth daily., Disp: , Rfl:  .  metoCLOPramide (REGLAN) 5 MG tablet, Take 1 tablet (5 mg total) by mouth 3 (three) times daily before meals., Disp: 90 tablet, Rfl: 0 .  mometasone-formoterol (DULERA) 100-5 MCG/ACT AERO, Inhale 2 puffs into the lungs 2 (two) times daily., Disp: 1 Inhaler, Rfl: 5 .  montelukast (SINGULAIR) 10 MG tablet, TAKE 1 TABLET BY MOUTH ONCE DAILY AT BEDTIME, Disp: 90 tablet, Rfl: 9 .  NONFORMULARY OR COMPOUNDED ITEM, [facility]  Onychomycosis Nail Lacquer -  Fluconazole 2%, Terbinafine 1% DMSO Apply to affected nail once daily Qty. 120 gm 3 refills, Disp: , Rfl:  .  PROAIR HFA 108 (90 Base) MCG/ACT inhaler, INHALE 2 PUFFS BY  MOUTH EVERY 6 HOURS AS NEEDED FOR  SHORTNESS  OF  BREATH, Disp: 9 each, Rfl: 0 .  traZODone (DESYREL) 50 MG tablet, Take 50 mg by mouth at bedtime. , Disp: , Rfl: 0 .  ondansetron (ZOFRAN) 4 MG tablet, Take 1 tablet (4 mg total) by mouth every 8 (eight) hours as needed for nausea or vomiting., Disp: 60 tablet, Rfl: 2 .  prochlorperazine (COMPAZINE) 25 MG suppository, Place 1 suppository (25 mg total) rectally daily as needed for nausea or vomiting., Disp: 6 suppository, Rfl: 0   Vital signs in last 24 hrs: Vitals:    03/07/18 1109  BP: 120/80  Pulse: 68    Physical Exam  Well-appearing, conversational, no acute distress.  She has her 8-month-old daughter [name] with her today.  HEENT: sclera anicteric, oral mucosa moist without lesions  Neck: supple, no thyromegaly, JVD or lymphadenopathy  Cardiac: RRR without murmurs, S1S2 heard, no peripheral edema  Pulm: clear to auscultation bilaterally, normal RR and effort noted  Abdomen: soft, mild upper abdominal tenderness, with active bowel sounds. No guarding or palpable hepatosplenomegaly.  Skin; warm and dry, no jaundice or rash Rectal: Normal external, normal sphincter tone, no tenderness or fissure, no palpable internal lesions. Normal resting and voluntary sphincter tone and puborectalis contraction, rectal descent is felt with Valsalva.  Recent Labs:  Reports Hgb A1C 7.1 3 months ago with [name] at [place]   @ASSESSMENTPLANBEGIN @ Assessment: Encounter Diagnoses  Name Primary?  . Gastroparesis due to DM ([name]) Yes  . Slow transit constipation   . Abdominal bloating   . Non-intractable cyclical vomiting with nausea    Although her diabetes is under better control than before, I still suspect it has been part of the dysmotility problems that she has.  Given her description of the constipation, I wonder if she may have pelvic dyssynergia.  The rectal bleeding certainly sounds hemorrhoidal, but before we could consider something like banding, I feel we must try to treat the constipation better or the bleeding may just return afterwards. I cannot use metoclopramide in conjunction with her Latuda because of the risks of tardive dyskinesia and cardiac arrhythmia.  Even if domperidone could be obtained, there would be less neurological risk of the same cardiac risk.   Plan: Anorectal manometry.  It was described and she is agreeable.  If it shows dyssynergy defecation, biofeedback might be helpful. Trial of Linzess 145 mcg once daily, samples given  for 2 weeks As needed use of ondansetron or, if protracted vomiting, Compazine suppository.  Those were prescribed.  Total time 45 minutes, over half spent face-to-face with patient in counseling and coordination of care.   Nelida Meuse III

## 2018-03-15 ENCOUNTER — Ambulatory Visit: Payer: Medicaid Other | Admitting: Family Medicine

## 2018-03-15 ENCOUNTER — Telehealth: Payer: Self-pay

## 2018-03-15 ENCOUNTER — Other Ambulatory Visit: Payer: Self-pay

## 2018-03-15 ENCOUNTER — Encounter: Payer: Self-pay | Admitting: Family Medicine

## 2018-03-15 VITALS — BP 142/90 | HR 89 | Temp 98.0°F | Resp 16 | Ht 66.0 in | Wt 188.5 lb

## 2018-03-15 DIAGNOSIS — J45909 Unspecified asthma, uncomplicated: Secondary | ICD-10-CM

## 2018-03-15 DIAGNOSIS — N3090 Cystitis, unspecified without hematuria: Secondary | ICD-10-CM

## 2018-03-15 DIAGNOSIS — K5901 Slow transit constipation: Secondary | ICD-10-CM

## 2018-03-15 LAB — URINALYSIS, ROUTINE W REFLEX MICROSCOPIC
BILIRUBIN URINE: NEGATIVE
Bacteria, UA: NONE SEEN /HPF
KETONES UR: NEGATIVE
NITRITE: NEGATIVE
PH: 5.5 (ref 5.0–8.0)
Specific Gravity, Urine: 1.02 (ref 1.001–1.03)

## 2018-03-15 LAB — MICROSCOPIC MESSAGE

## 2018-03-15 MED ORDER — PREDNISONE 20 MG PO TABS: ORAL_TABLET | ORAL | 0 refills | Status: DC

## 2018-03-15 MED ORDER — MOMETASONE FURO-FORMOTEROL FUM 100-5 MCG/ACT IN AERO: 2.0000 | INHALATION_SPRAY | Freq: Two times a day (BID) | RESPIRATORY_TRACT | 5 refills | Status: DC

## 2018-03-15 MED ORDER — FLUCONAZOLE 150 MG PO TABS: 150.0000 mg | ORAL_TABLET | Freq: Once | ORAL | 0 refills | Status: AC

## 2018-03-15 MED ORDER — PHENAZOPYRIDINE HCL 200 MG PO TABS: 200.0000 mg | ORAL_TABLET | Freq: Three times a day (TID) | ORAL | 0 refills | Status: DC | PRN

## 2018-03-15 MED ORDER — BENZONATATE 200 MG PO CAPS: 200.0000 mg | ORAL_CAPSULE | Freq: Two times a day (BID) | ORAL | 0 refills | Status: DC | PRN

## 2018-03-15 MED ORDER — CEPHALEXIN 500 MG PO CAPS: 500.0000 mg | ORAL_CAPSULE | Freq: Four times a day (QID) | ORAL | 0 refills | Status: AC

## 2018-03-15 NOTE — Telephone Encounter (Signed)
Anorectal manometry has been scheduled at [place] for 03-25-2018 @ 830am. Pt has been notified and aware of the appointment. Instructions have also been mailed to her home address on file. Also a good RX card was sent to her, as she states the compazine rectal supp. Are not covered with her insurance.

## 2018-03-15 NOTE — Patient Instructions (Addendum)
If your symptoms do not improve we need to rule out other causes of your symptoms including STD's, yeast, etc.    Please get a follow up appt if not improving in 3-5 days  If you worsen over the weekend you can call us at that are after hours pager  If you have any severe pain, nausea vomiting, or spikes in blood sugar that become out of control, go to the ER for evaluation.

## 2018-03-15 NOTE — Progress Notes (Signed)
Patient ID: [name], female    DOB: 03-25-1986, 32 y.o.   MRN: [id number]  PCP: [name], MD  Chief Complaint  Patient presents with  . Urinary Tract Infection    Patient in for urinary urgency, frequency, burning    Subjective:   [name] is a 32 y.o. female, presents to clinic with CC of 1 week of gradually worsening urinary symptoms, including urgency, cloudy urine, with dysuria, increased frequency, malodorous urine, very mild back aches.  No history of recurrent UTIs.  Patient does have type 1 diabetes, managed on a insulin pump, states that her sugars are well controlled.  Denies any associated vaginal symptoms, no history of STDs.  She denies any abdominal pain, nausea, vomiting, fever, sweats, chills.  Patient Active Problem List   Diagnosis Date Noted  . DM retinopathy ([place])   . Encounter for preconception consultation   . MDD (major depressive disorder), severe ([place]) 05/31/2014  . Drug overdose, intentional ([place]) 03/27/2014  . Asthma 11/10/2013  . Colles' fracture of left radius 10/11/2013  . Type I diabetes mellitus with complication, uncontrolled ([place]) 10/11/2013  . MDD (major depressive disorder), recurrent episode, severe ([place]) 09/26/2013  . Generalized anxiety disorder 09/26/2013  . Hyperglycemia 04/30/2012  . Diabetic retinopathy associated with type 1 diabetes mellitus ([place]) 04/30/2012  . Polyneuropathy in diabetes(357.2) 04/30/2012  . H/O insertion of insulin pump 04/30/2012  . CHOLELITHIASIS 08/10/2010  . ATRIAL TACHYCARDIA 08/08/2010  . Gastroparesis 08/08/2010  . LIVER FUNCTION TESTS, ABNORMAL, HX OF 08/08/2010  . Type 1 diabetes mellitus with ketoacidosis, uncontrolled ([place]) 08/02/2010  . BOWEL INTUSSUSCEPTION 08/02/2010  . PANCREATITIS, ACUTE, HX OF 08/02/2010  . ABDOMINAL PAIN, LEFT UPPER QUADRANT 08/01/2010     Prior to Admission medications   Medication Sig Start Date End Date Taking? Authorizing Provider    albuterol (PROAIR HFA) 108 (90 Base) MCG/ACT inhaler INHALE 2 PUFFS BY MOUTH EVERY 6 HOURS AS NEEDED FOR SHORTNESS OF BREATH 12/27/17  Yes [name], MD  albuterol (PROVENTIL) (2.5 MG/3ML) 0.083% nebulizer solution Take 3 mLs (2.5 mg total) by nebulization every 4 (four) hours as needed for wheezing or shortness of breath. 11/10/16 05/06/18 Yes [name], MD  enalapril (VASOTEC) 10 MG tablet Take 10 mg by mouth daily.   Yes [provider]  furosemide (LASIX) 40 MG tablet Take 40 mg by mouth. Every other day   Yes [provider]  Insulin Human (INSULIN PUMP) SOLN Inject 1 each into the skin 3 times daily with meals, bedtime and 2 AM. 38 units/day basal; bolus sliding scale   Yes [provider]  lamoTRIgine (LAMICTAL) 100 MG tablet Take 100 mg by mouth 2 (two) times daily.   Yes [provider]  lamoTRIgine (LAMICTAL) 200 MG tablet Take 200 mg by mouth 2 (two) times daily.    Yes [provider]  Lurasidone HCl (LATUDA) 120 MG TABS Take 1 tablet by mouth daily.   Yes [provider]  mometasone-formoterol (DULERA) 100-5 MCG/ACT AERO Inhale 2 puffs into the lungs 2 (two) times daily. 11/10/16  Yes [name], MD  montelukast (SINGULAIR) 10 MG tablet TAKE 1 TABLET BY MOUTH ONCE DAILY AT BEDTIME 07/12/17  Yes [name], MD  NONFORMULARY OR COMPOUNDED ITEM [facility]  Onychomycosis Nail Lacquer -  Fluconazole 2%, Terbinafine 1% DMSO Apply to affected nail once daily Qty. 120 gm 3 refills   Yes [provider]  ondansetron (ZOFRAN) 4 MG  tablet Take 1 tablet (4 mg total) by mouth every 8 (eight) hours as needed for nausea or vomiting. 03/07/18  Yes Danis, Kirke Corin, MD  PROAIR HFA 108 (90 Base) MCG/ACT inhaler INHALE 2 PUFFS BY MOUTH EVERY 6 HOURS AS NEEDED FOR  SHORTNESS  OF  BREATH 12/28/17  Yes [name], MD  traZODone (DESYREL) 50 MG tablet Take 50 mg by mouth at bedtime.  11/30/16  Yes [provider]  prochlorperazine (COMPAZINE) 25 MG suppository Place 1 suppository (25 mg total) rectally daily as needed for nausea or vomiting. Patient not taking: Reported on 03/15/2018 03/07/18   Doran Stabler, MD     Allergies  Allergen Reactions  . Ceftin Rash     Family History  Problem Relation Age of Onset  . Hypertension Unknown   . Diabetes Mother        type 2  . Diabetes Brother        type 1  . Renal Disease Brother        renal failure  . Hypertension Brother   . Colon cancer Neg Hx   . Rectal cancer Neg Hx   . Esophageal cancer Neg Hx      Social History   Socioeconomic History  . Marital status: Legally Separated    Spouse name: Not on file  . Number of children: 2  . Years of education: Not on file  . Highest education level: Not on file  Occupational History  . Occupation: Materials engineer  Social Needs  . Financial resource strain: Not on file  . Food insecurity:    Worry: Not on file    Inability: Not on file  . Transportation needs:    Medical: Not on file    Non-medical: Not on file  Tobacco Use  . Smoking status: Never Smoker  . Smokeless tobacco: Never Used  Substance and Sexual Activity  . Alcohol use: No  . Drug use: No  . Sexual activity: Yes    Partners: Male  Lifestyle  . Physical activity:    Days per week: Not on file    Minutes per session: Not on file  . Stress: Not on file  Relationships  . Social connections:    Talks on phone: Not on file    Gets together: Not on file    Attends religious service: Not on file    Active member of club or organization: Not on file    Attends meetings of clubs or organizations: Not on file    Relationship status: Not on file  . Intimate partner violence:    Fear of current or ex partner: Not on file    Emotionally abused: Not on file    Physically abused: Not on file    Forced sexual activity: Not on file  Other Topics Concern  . Not on file  Social History Narrative   Pt has a  daughter and boyfriend.            Review of Systems  All other systems reviewed and are negative.      Objective:    Vitals:   03/15/18 1119  BP: (!) 142/90  Pulse: 89  Resp: 16  Temp: 98 F (36.7 C)  TempSrc: Oral  SpO2: 98%  Weight: 188 lb 8 oz (85.5 kg)  Height: 5\' 6"  (1.676 m)      Physical Exam  Constitutional: She is oriented to person, place, and time. She appears well-developed  and well-nourished.  Non-toxic appearance. No distress.  HENT:  Head: Normocephalic and atraumatic.  Right Ear: External ear normal.  Left Ear: External ear normal.  Nose: Nose normal.  Mouth/Throat: Uvula is midline, oropharynx is clear and moist and mucous membranes are normal. No oropharyngeal exudate.  Mucous membranes moist  Eyes: Pupils are equal, round, and reactive to light. Conjunctivae, EOM and lids are normal.  Neck: Normal range of motion and phonation normal. Neck supple. No tracheal deviation present.  Cardiovascular: Normal rate, regular rhythm, normal heart sounds, intact distal pulses and normal pulses. Exam reveals no gallop and no friction rub.  No murmur heard. Pulses:      Radial pulses are 2+ on the right side, and 2+ on the left side.       Posterior tibial pulses are 2+ on the right side, and 2+ on the left side.  Pulmonary/Chest: Effort normal and breath sounds normal. No stridor. No respiratory distress. She has no wheezes. She has no rhonchi. She has no rales. She exhibits no tenderness.  Abdominal: Soft. Normal appearance and bowel sounds are normal. She exhibits no distension and no mass. There is no tenderness. There is no rebound and no guarding.  No CVA tenderness  Musculoskeletal: Normal range of motion. She exhibits no edema or deformity.  Lymphadenopathy:    She has no cervical adenopathy.  Neurological: She is alert and oriented to person, place, and time. She exhibits normal muscle tone. Coordination and gait normal.  Skin: Skin is warm, dry and  intact. Capillary refill takes less than 2 seconds. No rash noted. She is not diaphoretic. No pallor.  Psychiatric: She has a normal mood and affect. Her speech is normal and behavior is normal.  Nursing note and vitals reviewed.   Results for orders placed or performed in visit on 03/15/18  Urine Culture  Result Value Ref Range   MICRO NUMBER: [id number]    SPECIMEN QUALITY: ADEQUATE    Sample Source URINE    STATUS: FINAL    ISOLATE 1: Escherichia coli (A)       Susceptibility   Escherichia coli - URINE CULTURE, REFLEX    AMOX/CLAVULANIC <=2 Sensitive     AMPICILLIN 4 Sensitive     AMPICILLIN/SULBACTAM <=2 Sensitive     CEFAZOLIN* <=4 Not Reportable      * For infections other than uncomplicated UTIcaused by E. coli, K. pneumoniae or P. mirabilis:Cefazolin is resistant if MIC > or = 8 mcg/mL.(Distinguishing susceptible versus intermediatefor isolates with MIC < or = 4 mcg/mL requiresadditional testing.)For uncomplicated UTI caused by E. coli,K. pneumoniae or P. mirabilis: Cefazolin issusceptible if MIC <32 mcg/mL and predictssusceptible to the oral agents cefaclor, cefdinir,cefpodoxime, cefprozil, cefuroxime, cephalexinand loracarbef.    CEFEPIME <=1 Sensitive     CEFTRIAXONE <=1 Sensitive     CIPROFLOXACIN <=0.25 Sensitive     LEVOFLOXACIN <=0.12 Sensitive     ERTAPENEM <=0.5 Sensitive     GENTAMICIN <=1 Sensitive     IMIPENEM <=0.25 Sensitive     NITROFURANTOIN <=16 Sensitive     PIP/TAZO <=4 Sensitive     TOBRAMYCIN <=1 Sensitive     TRIMETH/SULFA* <=20 Sensitive      * For infections other than uncomplicated UTIcaused by E. coli, K. pneumoniae or P. mirabilis:Cefazolin is resistant if MIC > or = 8 mcg/mL.(Distinguishing susceptible versus intermediatefor isolates with MIC < or = 4 mcg/mL requiresadditional testing.)For uncomplicated UTI caused by E. coli,K. pneumoniae or P. mirabilis: Cefazolin issusceptible if MIC <32 mcg/mL  and predictssusceptible to the oral agents cefaclor,  cefdinir,cefpodoxime, cefprozil, cefuroxime, cephalexinand loracarbef.Legend:S = Susceptible  I = IntermediateR = Resistant  NS = Not susceptible* = Not tested  NR = Not reported**NN = See antimicrobic comments  Urinalysis, Routine w reflex microscopic  Result Value Ref Range   Color, Urine YELLOW YELLOW   APPearance CLEAR CLEAR   Specific Gravity, Urine 1.020 1.001 - 1.03   pH 5.5 5.0 - 8.0   Glucose, UA TRACE (A) NEGATIVE   Bilirubin Urine NEGATIVE NEGATIVE   Ketones, ur NEGATIVE NEGATIVE   Hgb urine dipstick TRACE (A) NEGATIVE   Protein, ur 2+ (A) NEGATIVE   Nitrite NEGATIVE NEGATIVE   Leukocytes, UA TRACE (A) NEGATIVE   WBC, UA 6-10 (A) 0 - 5 /HPF   RBC / HPF 0-2 0 - 2 /HPF   Squamous Epithelial / LPF 0-5 < OR = 5 /HPF   Bacteria, UA NONE SEEN NONE SEEN /HPF  Microscopic Message  Result Value Ref Range   Note           Assessment & Plan:      ICD-10-CM   1. Cystitis N30.90 Urinalysis, Routine w reflex microscopic    Urine Culture    cephALEXin (KEFLEX) 500 MG capsule    phenazopyridine (PYRIDIUM) 200 MG tablet  2. Moderate asthma without complication, unspecified whether persistent J45.909 mometasone-formoterol (DULERA) 100-5 MCG/ACT AERO    predniSONE (DELTASONE) 20 MG tablet    benzonatate (TESSALON) 200 MG capsule    32 year old female with type 1 diabetes, presents with urinary symptoms for more than 1 week, no abdominal pain or tenderness on exam, no CVA tenderness, vital signs appear stable, mildly elevated blood pressure but otherwise within normal limits not concerning for spreading infection or pyelonephritis.   Urine dip was positive for protein 2+, trace leuks, blood and glucose with 6-10 WBCs on hpf, will start treatment for UTI and obtain urine culture.   Will give Diflucan in case she developed secondary yeast infection.  She requested refill of asthma medications, the very end of the visit she asked for refill, stated that she has had worsening asthma  using her albuterol rescue inhaler 3-5 times a day without much improvement for at least a week or 2.  No respiratory distress or wheeze on exam, however given her uncontrolled state we will refill dulera, and give steroid burst with cough medicines to use if using her controller medicine does not improve her asthma symptoms and frequency use of Saba.  Follow up as needed.   Delsa Grana, PA-C 03/15/18 12:00 PM

## 2018-03-18 LAB — URINE CULTURE
MICRO NUMBER:: 90686862
SPECIMEN QUALITY: ADEQUATE

## 2018-03-19 NOTE — Progress Notes (Signed)
Urine culture was significant for E. coli, antibiotic should effectively kill this and she should complete her prescribed antibiotics.  She could follow-up if she does not have her symptoms resolved

## 2018-03-22 NOTE — Progress Notes (Signed)
Talked with patient and confirmed appointment for Monday 6/17 for anal manometry. Pt confirmed she was coming.

## 2018-03-25 ENCOUNTER — Encounter (HOSPITAL_COMMUNITY)
Admission: RE | Disposition: A | Discharge status: Home / Self Care | Payer: Self-pay | Source: Ambulatory Visit | Attending: Gastroenterology

## 2018-03-25 ENCOUNTER — Ambulatory Visit (HOSPITAL_COMMUNITY)
Admission: RE | Admit: 2018-03-25 | Discharge: 2018-03-25 | Disposition: A | Discharge status: Home / Self Care | Payer: Medicaid Other | Source: Ambulatory Visit | Attending: Gastroenterology | Admitting: Gastroenterology

## 2018-03-25 DIAGNOSIS — K5900 Constipation, unspecified: Secondary | ICD-10-CM

## 2018-03-25 HISTORY — PX: ANAL RECTAL MANOMETRY: SHX6358

## 2018-03-25 SURGERY — MANOMETRY, ANORECTAL

## 2018-03-25 NOTE — Progress Notes (Signed)
Performed ano rectal manometry per protocol.  Patient tolerated procedure without complication.  Balloon expulsion test performed and expelled after 19 seconds. Dr. Silverio Decamp to interpret results.

## 2018-03-26 ENCOUNTER — Encounter (HOSPITAL_COMMUNITY): Payer: Self-pay | Admitting: Gastroenterology

## 2018-03-28 ENCOUNTER — Ambulatory Visit: Payer: Medicaid Other | Admitting: Family Medicine

## 2018-03-28 ENCOUNTER — Encounter: Payer: Self-pay | Admitting: Family Medicine

## 2018-03-28 VITALS — BP 140/90 | HR 98 | Temp 98.1°F | Resp 14 | Ht 66.0 in | Wt 184.0 lb

## 2018-03-28 DIAGNOSIS — L301 Dyshidrosis [pompholyx]: Secondary | ICD-10-CM | POA: Diagnosis not present

## 2018-03-28 MED ORDER — MOMETASONE FUROATE 0.1 % EX OINT: TOPICAL_OINTMENT | Freq: Every day | CUTANEOUS | 0 refills | Status: DC

## 2018-03-28 NOTE — Progress Notes (Signed)
Subjective:    Patient ID: [name], female    DOB: 09-17-1986, 32 y.o.   MRN: [id number]  HPI  Patient presents with an itchy rash on both feet.  Rash consist of well-circumscribed erythematous plaques with vesicular lesions within the back.  This includes the dorsum of the left foot, the medial aspect of the right foot.  On the dorsum of the ankle.  It appears to be either dyshidrotic eczema or an unusual presentation for tinea pedis.  Rash is limited only to the feet.  Patient has tried no creams or moisturizers.  Rashes been present for less than a week Past Medical History:  Diagnosis Date  . Anxiety   . Asthma   . Asthma 11/10/2013  . Bipolar 1 disorder ([place])   . Depression   . Diabetic gastroparesis ([place])   . Diabetic neuropathy, type I diabetes mellitus ([place])   . Gastroparesis   . Headache(784.0)   . Hypertension   . Kidney stones   . Polyneuropathy in diabetes(357.2)   . 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[place])   . Tachycardia    baseline tachycardia   . 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 ANAL RECTAL MANOMETRY N/A 03/25/2018   Procedure: ANO RECTAL MANOMETRY;  Surgeon: Doran Stabler, MD;  Location: [facility];  Service: Gastroenterology;  Laterality: N/A;  . CESAREAN SECTION     x 2  . CHOLECYSTECTOMY    . REFRACTIVE SURGERY Bilateral    Current Outpatient Medications on File Prior to Visit  Medication Sig Dispense Refill  . albuterol (PROAIR HFA) 108 (90 Base) MCG/ACT inhaler INHALE 2 PUFFS BY MOUTH EVERY 6 HOURS AS NEEDED FOR SHORTNESS OF BREATH 18 g 3  . albuterol (PROVENTIL) (2.5 MG/3ML) 0.083% nebulizer solution Take 3 mLs (2.5 mg total) by nebulization every 4 (four) hours as needed for wheezing or shortness of breath. 30 vial 1  . enalapril (VASOTEC) 10 MG tablet Take 10 mg by mouth daily.    . furosemide (LASIX) 40 MG tablet Take 40 mg by  mouth. Every other day    . Insulin Human (INSULIN PUMP) SOLN Inject 1 each into the skin 3 times daily with meals, bedtime and 2 AM. 38 units/day basal; bolus sliding scale    . lamoTRIgine (LAMICTAL) 100 MG tablet Take 100 mg by mouth 2 (two) times daily.    Marland Kitchen lamoTRIgine (LAMICTAL) 200 MG tablet Take 200 mg by mouth 2 (two) times daily.     . Lurasidone HCl (LATUDA) 120 MG TABS Take 1 tablet by mouth daily.    . mometasone-formoterol (DULERA) 100-5 MCG/ACT AERO Inhale 2 puffs into the lungs 2 (two) times daily. 1 Inhaler 5  . montelukast (SINGULAIR) 10 MG tablet TAKE 1 TABLET BY MOUTH ONCE DAILY AT BEDTIME 90 tablet 9  . NONFORMULARY OR COMPOUNDED ITEM [facility]  Onychomycosis Nail Lacquer -  Fluconazole 2%, Terbinafine 1% DMSO Apply to affected nail once daily Qty. 120 gm 3 refills    . ondansetron (ZOFRAN) 4 MG tablet Take 1 tablet (4 mg total) by mouth every 8 (eight) hours as needed for nausea or vomiting. 60 tablet 2  . phenazopyridine (PYRIDIUM) 200 MG tablet Take 1 tablet (200 mg total) by mouth 3 (three) times daily as needed for pain. 10 tablet 0  . PROAIR HFA 108 (90 Base) MCG/ACT inhaler INHALE 2 PUFFS BY MOUTH EVERY  6 HOURS AS NEEDED FOR  SHORTNESS  OF  BREATH 9 each 0  . prochlorperazine (COMPAZINE) 25 MG suppository Place 1 suppository (25 mg total) rectally daily as needed for nausea or vomiting. 6 suppository 0  . traZODone (DESYREL) 50 MG tablet Take 50 mg by mouth at bedtime.   0   No current facility-administered medications on file prior to visit.    Allergies  Allergen Reactions  . Ceftin Rash   Social History   Socioeconomic History  . Marital status: Legally Separated    Spouse name: Not on file  . Number of children: 2  . Years of education: Not on file  . Highest education level: Not on file  Occupational History  . Occupation: Materials engineer  Social Needs  . Financial resource strain: Not on file  . Food insecurity:    Worry: Not on file     Inability: Not on file  . Transportation needs:    Medical: Not on file    Non-medical: Not on file  Tobacco Use  . Smoking status: Never Smoker  . Smokeless tobacco: Never Used  Substance and Sexual Activity  . Alcohol use: No  . Drug use: No  . Sexual activity: Yes    Partners: Male  Lifestyle  . Physical activity:    Days per week: Not on file    Minutes per session: Not on file  . Stress: Not on file  Relationships  . Social connections:    Talks on phone: Not on file    Gets together: Not on file    Attends religious service: Not on file    Active member of club or organization: Not on file    Attends meetings of clubs or organizations: Not on file    Relationship status: Not on file  . Intimate partner violence:    Fear of current or ex partner: Not on file    Emotionally abused: Not on file    Physically abused: Not on file    Forced sexual activity: Not on file  Other Topics Concern  . Not on file  Social History Narrative   Pt has a daughter and boyfriend.            Review of Systems  All other systems reviewed and are negative.      Objective:   Physical Exam  Constitutional: She appears well-developed and well-nourished.  Cardiovascular: Normal rate and regular rhythm.  Pulmonary/Chest: Effort normal and breath sounds normal.  Musculoskeletal:       Feet:  Skin: Rash noted. Rash is papular.  Vitals reviewed.         Assessment & Plan:  Dyshidrotic eczema  Differential diagnosis is dyshidrotic eczema versus atypical tinea pedis.  We will treat the patient with Elocon ointment twice daily for 1 week and then reassess.  If rash is worsening, I would switch to Lotrimin twice daily for 2 weeks.

## 2018-04-05 DIAGNOSIS — K5900 Constipation, unspecified: Secondary | ICD-10-CM

## 2018-04-10 ENCOUNTER — Telehealth: Payer: Self-pay

## 2018-04-10 ENCOUNTER — Other Ambulatory Visit: Payer: Self-pay

## 2018-04-10 MED ORDER — LINACLOTIDE 145 MCG PO CAPS: 145.0000 ug | ORAL_CAPSULE | Freq: Every day | ORAL | 3 refills | Status: DC

## 2018-04-10 NOTE — Telephone Encounter (Signed)
-----   Message from [place], MD sent at 04/10/2018  7:18 AM EDT ----- Please find out how she is doing with the [place].   If not better, please have her get samples of the 290 microgram dose to use once daily.  The anorectal manometry shows that the pelvic muscle function is somewhat uncoordinated, contributing to the constipation. If she is unable to have regular BM with Linzess and still needs to periodically disimpact herself, then please send a referral for biofeedback therapy.

## 2018-04-10 NOTE — Telephone Encounter (Signed)
Spoke with the patient. She is advised of her results. She states she is feeling better and having regular bowel movements on Linzess. She is pleased with this and will continue on Linzess 145 mcg daily. Rx to the pharmacy of the patient's choice. She will call if she acutely worsens or fails improving.

## 2018-04-18 ENCOUNTER — Telehealth: Payer: Self-pay | Admitting: Gastroenterology

## 2018-04-18 NOTE — Telephone Encounter (Signed)
Patient had a salad on Saturday and since then has been having trouble with gastroparesis (fullness, bloating, pain). She is able to keep liquids down, has very little food since Sunday. She has been taking the Linzess and having good response from that. Also taking Zofran for the nausea. Sugars prior to today had been better, last reading was around noon today and was 207. She did have a protein shake for breakfast.

## 2018-04-19 NOTE — Telephone Encounter (Signed)
Spoke to patient she is feeling a little better today, did have some vomiting yesterday evening. She was instructed to follow gastroparesis diet closely, let her know that salads and that type of roughage do commonly cause this reaction. She would like some information mailed to her on the domperidone, will put this in the mail. She will need to call the number highlighted at top and Rx will need to be faxed in for her. She will call back to let us know if she would like to try this medication.

## 2018-04-19 NOTE — Telephone Encounter (Signed)
Sorry to hear she is not feeling well. Gastroparesis diet very important to follow - roughage commonly causes symptoms.  The diet is at least as important as meds. See my May office note- we already discussed why I cannot prescribe the metoclopramide.  She can use the compazine suppositories as needed.  The stomach will empty this roughage slowly, and she needs to stay on a full liquid diet until symptoms subside. Please giver her information for trying to obtain domperidone. Less neurological side effect risk. I do not know what the cost would be, but she could consider it given the worsening of gastroparesis symptoms in last few months. (for documentation only - EKG 10/2016 with normal QTc)

## 2018-04-30 ENCOUNTER — Ambulatory Visit: Payer: Medicaid Other | Admitting: Family Medicine

## 2018-04-30 ENCOUNTER — Encounter: Payer: Self-pay | Admitting: Family Medicine

## 2018-04-30 VITALS — BP 160/120 | HR 98 | Temp 98.3°F | Resp 14 | Ht 66.0 in | Wt 188.0 lb

## 2018-04-30 DIAGNOSIS — E1040 Type 1 diabetes mellitus with diabetic neuropathy, unspecified: Secondary | ICD-10-CM

## 2018-04-30 DIAGNOSIS — L98491 Non-pressure chronic ulcer of skin of other sites limited to breakdown of skin: Secondary | ICD-10-CM

## 2018-04-30 NOTE — Progress Notes (Signed)
Subjective:    Patient ID: [name], female    DOB: 1985-10-10, 32 y.o.   MRN: [id number]  HPI Patient is a 32 year old Caucasian female with a history of poorly controlled type 1 diabetes mellitus who presents today with pre-ulcerative calluses on both feet.  On the left foot there is a nickel size callus on the plantar aspect of the first MTP joint with skin breakdown and capillary hemorrhages into the thick callus skin.  She also has a similar lesion that is approximately 8 mm in diameter on the medial plantar aspect of the left great toe.  On the right foot there is a similar lesion 1 cm in diameter on the medial aspect of the right great toe  Past Medical History:  Diagnosis Date  . Anxiety   . Asthma   . Asthma 11/10/2013  . Bipolar 1 disorder ([place])   . Depression   . Diabetic gastroparesis ([place])   . Diabetic neuropathy, type I diabetes mellitus ([place])   . Gastroparesis   . Headache(784.0)   . Hypertension   . Kidney stones   . Polyneuropathy in diabetes(357.2)   . 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[place])   . Tachycardia    baseline tachycardia   . 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 ANAL RECTAL MANOMETRY N/A 03/25/2018   Procedure: ANO RECTAL MANOMETRY;  Surgeon: Doran Stabler, MD;  [facility];  Service: Gastroenterology;  Laterality: N/A;  . CESAREAN SECTION     x 2  . CHOLECYSTECTOMY    . REFRACTIVE SURGERY Bilateral    Current Outpatient Medications on File Prior to Visit  Medication Sig Dispense Refill  . albuterol (PROAIR HFA) 108 (90 Base) MCG/ACT inhaler INHALE 2 PUFFS BY MOUTH EVERY 6 HOURS AS NEEDED FOR SHORTNESS OF BREATH 18 g 3  . albuterol (PROVENTIL) (2.5 MG/3ML) 0.083% nebulizer solution Take 3 mLs (2.5 mg total) by nebulization every 4 (four) hours as needed for wheezing or shortness of breath. 30 vial 1  . enalapril (VASOTEC) 10 MG  tablet Take 10 mg by mouth daily.    . furosemide (LASIX) 40 MG tablet Take 40 mg by mouth. Every other day    . Insulin Human (INSULIN PUMP) SOLN Inject 1 each into the skin 3 times daily with meals, bedtime and 2 AM. 38 units/day basal; bolus sliding scale    . lamoTRIgine (LAMICTAL) 100 MG tablet Take 100 mg by mouth 2 (two) times daily.    Marland Kitchen lamoTRIgine (LAMICTAL) 200 MG tablet Take 200 mg by mouth 2 (two) times daily.     Marland Kitchen linaclotide (LINZESS) 145 MCG CAPS capsule Take 1 capsule (145 mcg total) by mouth daily before breakfast. 30 capsule 3  . Lurasidone HCl (LATUDA) 120 MG TABS Take 1 tablet by mouth daily.    . mometasone (ELOCON) 0.1 % ointment Apply topically daily. 45 g 0  . mometasone-formoterol (DULERA) 100-5 MCG/ACT AERO Inhale 2 puffs into the lungs 2 (two) times daily. 1 Inhaler 5  . montelukast (SINGULAIR) 10 MG tablet TAKE 1 TABLET BY MOUTH ONCE DAILY AT BEDTIME 90 tablet 9  . NONFORMULARY OR COMPOUNDED ITEM [facility]  Onychomycosis Nail Lacquer -  Fluconazole 2%, Terbinafine 1% DMSO Apply to affected nail once daily Qty. 120 gm 3 refills    . ondansetron (ZOFRAN) 4 MG tablet Take 1 tablet (4 mg total) by mouth every  8 (eight) hours as needed for nausea or vomiting. 60 tablet 2  . phenazopyridine (PYRIDIUM) 200 MG tablet Take 1 tablet (200 mg total) by mouth 3 (three) times daily as needed for pain. 10 tablet 0  . PROAIR HFA 108 (90 Base) MCG/ACT inhaler INHALE 2 PUFFS BY MOUTH EVERY 6 HOURS AS NEEDED FOR  SHORTNESS  OF  BREATH 9 each 0  . prochlorperazine (COMPAZINE) 25 MG suppository Place 1 suppository (25 mg total) rectally daily as needed for nausea or vomiting. 6 suppository 0  . traZODone (DESYREL) 50 MG tablet Take 50 mg by mouth at bedtime.   0   No current facility-administered medications on file prior to visit.    Allergies  Allergen Reactions  . Ceftin Rash   Social History   Socioeconomic History  . Marital status: Legally Separated    Spouse  name: Not on file  . Number of children: 2  . Years of education: Not on file  . Highest education level: Not on file  Occupational History  . Occupation: Materials engineer  Social Needs  . Financial resource strain: Not on file  . Food insecurity:    Worry: Not on file    Inability: Not on file  . Transportation needs:    Medical: Not on file    Non-medical: Not on file  Tobacco Use  . Smoking status: Never Smoker  . Smokeless tobacco: Never Used  Substance and Sexual Activity  . Alcohol use: No  . Drug use: No  . Sexual activity: Yes    Partners: Male  Lifestyle  . Physical activity:    Days per week: Not on file    Minutes per session: Not on file  . Stress: Not on file  Relationships  . Social connections:    Talks on phone: Not on file    Gets together: Not on file    Attends religious service: Not on file    Active member of club or organization: Not on file    Attends meetings of clubs or organizations: Not on file    Relationship status: Not on file  . Intimate partner violence:    Fear of current or ex partner: Not on file    Emotionally abused: Not on file    Physically abused: Not on file    Forced sexual activity: Not on file  Other Topics Concern  . Not on file  Social History Narrative   Pt has a daughter and boyfriend.            Review of Systems  All other systems reviewed and are negative.      Objective:   Physical Exam  Cardiovascular: Normal rate and regular rhythm.  Pulmonary/Chest: Effort normal and breath sounds normal.  Musculoskeletal:       Left foot: There is deformity.       Feet:  Vitals reviewed.         Assessment & Plan:  Type 1 diabetes mellitus with diabetic neuropathy, unspecified ([place])  Callous ulcer, limited to breakdown of skin ([place])  Patient has diminished sensation to 10 g monofilament in both feet.  However she has normal dorsalis pedis and posterior tibialis pulses.  I spent more than 30 minutes today with  the patient.  Using a razor blade, I pared down the thick callus skin in each of the 3 areas diagrammed down to more supple pink skin.  On the left great toe there was a central lesion 5 mm  in diameter of white macerated skin but no ulcer.  On the right great toe there was a lesion approximately 6 mm in diameter of white macerated skin but no ulcer.  On the left first MTP joint, there was a lesion approximately 8 mm of macerated skin but no ulcer.  Each area was covered with Silvadene, and a bandage.  I recommended keeping weight off the affected areas.  Recheck in 1 week and repeat this process until healed.  Consult podiatry.  Once they have assumed care I will defer to their management.  Blood pressure is elevated today however she has not taken her medication.  She states that she will go home and take her medicines.

## 2018-05-07 ENCOUNTER — Ambulatory Visit: Payer: Medicaid Other | Admitting: Family Medicine

## 2018-05-09 ENCOUNTER — Ambulatory Visit (INDEPENDENT_AMBULATORY_CARE_PROVIDER_SITE_OTHER): Payer: Medicaid Other | Admitting: Family Medicine

## 2018-05-09 ENCOUNTER — Encounter: Payer: Self-pay | Admitting: Family Medicine

## 2018-05-09 VITALS — BP 140/88 | HR 100 | Temp 98.1°F | Resp 14 | Ht 66.0 in | Wt 189.0 lb

## 2018-05-09 DIAGNOSIS — E1040 Type 1 diabetes mellitus with diabetic neuropathy, unspecified: Secondary | ICD-10-CM

## 2018-05-09 DIAGNOSIS — L98491 Non-pressure chronic ulcer of skin of other sites limited to breakdown of skin: Secondary | ICD-10-CM | POA: Diagnosis not present

## 2018-05-09 NOTE — Progress Notes (Signed)
Subjective:    Patient ID: [name], female    DOB: 1986/07/11, 32 y.o.   MRN: [id number]  HPI  04/30/18 Patient is a 32 year old Caucasian female with a history of poorly controlled type 1 diabetes mellitus who presents today with pre-ulcerative calluses on both feet.  On the left foot there is a nickel size callus on the plantar aspect of the first MTP joint with skin breakdown and capillary hemorrhages into the thick callus skin.  She also has a similar lesion that is approximately 8 mm in diameter on the medial plantar aspect of the left great toe.  On the right foot there is a similar lesion 1 cm in diameter on the medial aspect of the right great toe.  At that time, my plan was: Patient has diminished sensation to 10 g monofilament in both feet.  However she has normal dorsalis pedis and posterior tibialis pulses.  I spent more than 30 minutes today with the patient.  Using a razor blade, I pared down the thick callus skin in each of the 3 areas diagrammed down to more supple pink skin.  On the left great toe there was a central lesion 5 mm in diameter of white macerated skin but no ulcer.  On the right great toe there was a lesion approximately 6 mm in diameter of white macerated skin but no ulcer.  On the left first MTP joint, there was a lesion approximately 8 mm of macerated skin but no ulcer.  Each area was covered with Silvadene, and a bandage.  I recommended keeping weight off the affected areas.  Recheck in 1 week and repeat this process until healed.  Consult podiatry.  Once they have assumed care I will defer to their management.  Blood pressure is elevated today however she has not taken her medication.  She states that she will go home and take her medicines.   05/09/18 Wounds look much better today compared to last week.  Instead of the white macerated skin with thick heavy hyperkeratotic callus and capillary hemorrhages within the callus that I saw last week, today I see  healthy pink skin with trace residual small amounts of hyperkeratotic callus.  There is no ulcer.  There is no skin breakdown.  On the right great toe, there is a patch of skin approximately the diameter of a nickel that is pink.  There is some mild residual callus material but much better than last week.  There is no skin breakdown.  On the left foot, there is some residual hyperkeratotic callus on the left great toe but no skin breakdown and no ulcer.  There is also some residual callus on the plantar aspect of the left first MTP joint but again no skin breakdown and no ulcer. Past Medical History:  Diagnosis Date  . Anxiety   . Asthma   . Asthma 11/10/2013  . Bipolar 1 disorder ([place])   . Depression   . Diabetic gastroparesis ([place])   . Diabetic neuropathy, type I diabetes mellitus ([place])   . Gastroparesis   . Headache(784.0)   . Hypertension   . Kidney stones   . Polyneuropathy in diabetes(357.2)   . 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[place])   . Tachycardia    baseline tachycardia   . 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 ANAL RECTAL MANOMETRY N/A 03/25/2018   Procedure: ANO RECTAL MANOMETRY;  Surgeon: Doran Stabler, MD;  Location: [facility];  Service: Gastroenterology;  Laterality: N/A;  . CESAREAN SECTION     x 2  . CHOLECYSTECTOMY    . REFRACTIVE SURGERY Bilateral    Current Outpatient Medications on File Prior to Visit  Medication Sig Dispense Refill  . albuterol (PROAIR HFA) 108 (90 Base) MCG/ACT inhaler INHALE 2 PUFFS BY MOUTH EVERY 6 HOURS AS NEEDED FOR SHORTNESS OF BREATH 18 g 3  . albuterol (PROVENTIL) (2.5 MG/3ML) 0.083% nebulizer solution Take 3 mLs (2.5 mg total) by nebulization every 4 (four) hours as needed for wheezing or shortness of breath. 30 vial 1  . enalapril (VASOTEC) 10 MG tablet Take 10 mg by mouth daily.    . furosemide (LASIX) 40 MG tablet Take 40 mg by  mouth. Every other day    . Insulin Human (INSULIN PUMP) SOLN Inject 1 each into the skin 3 times daily with meals, bedtime and 2 AM. 38 units/day basal; bolus sliding scale    . lamoTRIgine (LAMICTAL) 100 MG tablet Take 100 mg by mouth 2 (two) times daily.    Marland Kitchen lamoTRIgine (LAMICTAL) 200 MG tablet Take 200 mg by mouth 2 (two) times daily.     Marland Kitchen linaclotide (LINZESS) 145 MCG CAPS capsule Take 1 capsule (145 mcg total) by mouth daily before breakfast. 30 capsule 3  . Lurasidone HCl (LATUDA) 120 MG TABS Take 1 tablet by mouth daily.    . mometasone (ELOCON) 0.1 % ointment Apply topically daily. 45 g 0  . mometasone-formoterol (DULERA) 100-5 MCG/ACT AERO Inhale 2 puffs into the lungs 2 (two) times daily. 1 Inhaler 5  . montelukast (SINGULAIR) 10 MG tablet TAKE 1 TABLET BY MOUTH ONCE DAILY AT BEDTIME 90 tablet 9  . NONFORMULARY OR COMPOUNDED ITEM [facility]  Onychomycosis Nail Lacquer -  Fluconazole 2%, Terbinafine 1% DMSO Apply to affected nail once daily Qty. 120 gm 3 refills    . ondansetron (ZOFRAN) 4 MG tablet Take 1 tablet (4 mg total) by mouth every 8 (eight) hours as needed for nausea or vomiting. 60 tablet 2  . phenazopyridine (PYRIDIUM) 200 MG tablet Take 1 tablet (200 mg total) by mouth 3 (three) times daily as needed for pain. 10 tablet 0  . PROAIR HFA 108 (90 Base) MCG/ACT inhaler INHALE 2 PUFFS BY MOUTH EVERY 6 HOURS AS NEEDED FOR  SHORTNESS  OF  BREATH 9 each 0  . prochlorperazine (COMPAZINE) 25 MG suppository Place 1 suppository (25 mg total) rectally daily as needed for nausea or vomiting. 6 suppository 0  . traZODone (DESYREL) 50 MG tablet Take 50 mg by mouth at bedtime.   0   No current facility-administered medications on file prior to visit.    Allergies  Allergen Reactions  . Ceftin Rash   Social History   Socioeconomic History  . Marital status: Legally Separated    Spouse name: Not on file  . Number of children: 2  . Years of education: Not on file  .  Highest education level: Not on file  Occupational History  . Occupation: Materials engineer  Social Needs  . Financial resource strain: Not on file  . Food insecurity:    Worry: Not on file    Inability: Not on file  . Transportation needs:    Medical: Not on file    Non-medical: Not on file  Tobacco Use  . Smoking status: Never Smoker  . Smokeless tobacco: Never Used  Substance and Sexual Activity  .  Alcohol use: No  . Drug use: No  . Sexual activity: Yes    Partners: Male  Lifestyle  . Physical activity:    Days per week: Not on file    Minutes per session: Not on file  . Stress: Not on file  Relationships  . Social connections:    Talks on phone: Not on file    Gets together: Not on file    Attends religious service: Not on file    Active member of club or organization: Not on file    Attends meetings of clubs or organizations: Not on file    Relationship status: Not on file  . Intimate partner violence:    Fear of current or ex partner: Not on file    Emotionally abused: Not on file    Physically abused: Not on file    Forced sexual activity: Not on file  Other Topics Concern  . Not on file  Social History Narrative   Pt has a daughter and boyfriend.            Review of Systems  All other systems reviewed and are negative.      Objective:   Physical Exam  Cardiovascular: Normal rate and regular rhythm.  Pulmonary/Chest: Effort normal and breath sounds normal.  Musculoskeletal:       Left foot: There is deformity.       Feet:  Vitals reviewed.         Assessment & Plan:  Callous ulcer, limited to breakdown of skin ([place])  Type 1 diabetes mellitus with diabetic neuropathy, unspecified ([place])  I am very happy with how the wounds have progressed since last week.  Again using a razor blade, I removed as much of the hyperkeratotic yellow callus from the 3 diagram sites as possible.  There was no underlying ulcer or skin breakdown.  There only a few areas  of small capillary hemorrhages which is much improved compared to last week.  There is no evidence of cellulitis.  There is no infection.  There is no underlying exposed fascia or muscle or fat.  The wounds were then covered with Silvadene, nonadherent gauze, and Coban.  I have asked the patient to dress the wounds every day with gauze and silvadene and recheck in 1 week.  If it continues this trajectory, I suspect they will be virtually almost healed next week.  I still encourage the patient to see podiatry for diabetic shoes that may be more properly fitted to prevent this in the future.

## 2018-05-16 ENCOUNTER — Encounter: Payer: Self-pay | Admitting: Family Medicine

## 2018-05-16 ENCOUNTER — Ambulatory Visit (INDEPENDENT_AMBULATORY_CARE_PROVIDER_SITE_OTHER): Payer: Medicaid Other | Admitting: Family Medicine

## 2018-05-16 VITALS — BP 110/68 | HR 115 | Temp 98.1°F | Resp 16 | Ht 66.0 in | Wt 183.0 lb

## 2018-05-16 DIAGNOSIS — L98491 Non-pressure chronic ulcer of skin of other sites limited to breakdown of skin: Secondary | ICD-10-CM | POA: Diagnosis not present

## 2018-05-16 DIAGNOSIS — E1040 Type 1 diabetes mellitus with diabetic neuropathy, unspecified: Secondary | ICD-10-CM | POA: Diagnosis not present

## 2018-05-16 MED ORDER — GABAPENTIN 300 MG PO CAPS: 300.0000 mg | ORAL_CAPSULE | Freq: Three times a day (TID) | ORAL | 3 refills | Status: DC

## 2018-05-16 NOTE — Progress Notes (Signed)
Subjective:    Patient ID: [name], female    DOB: 1986/04/27, 32 y.o.   MRN: [id number]  HPI  04/30/18 Patient is a 32 year old Caucasian female with a history of poorly controlled type 1 diabetes mellitus who presents today with pre-ulcerative calluses on both feet.  On the left foot there is a nickel size callus on the plantar aspect of the first MTP joint with skin breakdown and capillary hemorrhages into the thick callus skin.  She also has a similar lesion that is approximately 8 mm in diameter on the medial plantar aspect of the left great toe.  On the right foot there is a similar lesion 1 cm in diameter on the medial aspect of the right great toe.  At that time, my plan was: Patient has diminished sensation to 10 g monofilament in both feet.  However she has normal dorsalis pedis and posterior tibialis pulses.  I spent more than 30 minutes today with the patient.  Using a razor blade, I pared down the thick callus skin in each of the 3 areas diagrammed down to more supple pink skin.  On the left great toe there was a central lesion 5 mm in diameter of white macerated skin but no ulcer.  On the right great toe there was a lesion approximately 6 mm in diameter of white macerated skin but no ulcer.  On the left first MTP joint, there was a lesion approximately 8 mm of macerated skin but no ulcer.  Each area was covered with Silvadene, and a bandage.  I recommended keeping weight off the affected areas.  Recheck in 1 week and repeat this process until healed.  Consult podiatry.  Once they have assumed care I will defer to their management.  Blood pressure is elevated today however she has not taken her medication.  She states that she will go home and take her medicines.   05/09/18 Wounds look much better today compared to last week.  Instead of the white macerated skin with thick heavy hyperkeratotic callus and capillary hemorrhages within the callus that I saw last week, today I see  healthy pink skin with trace residual small amounts of hyperkeratotic callus.  There is no ulcer.  There is no skin breakdown.  On the right great toe, there is a patch of skin approximately the diameter of a nickel that is pink.  There is some mild residual callus material but much better than last week.  There is no skin breakdown.  On the left foot, there is some residual hyperkeratotic callus on the left great toe but no skin breakdown and no ulcer.  There is also some residual callus on the plantar aspect of the left first MTP joint but again no skin breakdown and no ulcer.  At that time, my plan was: I am very happy with how the wounds have progressed since last week.  Again using a razor blade, I removed as much of the hyperkeratotic yellow callus from the 3 diagram sites as possible.  There was no underlying ulcer or skin breakdown.  There only a few areas of small capillary hemorrhages which is much improved compared to last week.  There is no evidence of cellulitis.  There is no infection.  There is no underlying exposed fascia or muscle or fat.  The wounds were then covered with Silvadene, nonadherent gauze, and Coban.  I have asked the patient to dress the wounds every day with gauze and silvadene and recheck  in 1 week.  If it continues this trajectory, I suspect they will be virtually almost healed next week.  I still encourage the patient to see podiatry for diabetic shoes that may be more properly fitted to prevent this in the future.  05/16/18 The wounds have completely healed over.  There is now only residual callus in the 3 areas diagrammed her physical exam.  There is no skin breakdown.  The area on the plantar aspect of the left first MTP joint and on the plantar aspect of the right big toe have capillary hemorrhages within the callus but again there is no skin breakdown.  The skin is now soft and supple.  She continues to complain of pins-and-needles pain particularly in the first MTP joint  and distal toe of her left foot. Past Medical History:  Diagnosis Date  . Anxiety   . Asthma   . Asthma 11/10/2013  . Bipolar 1 disorder ([place])   . Depression   . Diabetic gastroparesis ([place])   . Diabetic neuropathy, type I diabetes mellitus ([place])   . Gastroparesis   . Headache(784.0)   . Hypertension   . Kidney stones   . Polyneuropathy in diabetes(357.2)   . 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 Tachycardia    baseline tachycardia   . 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 ANAL RECTAL MANOMETRY N/A 03/25/2018   Procedure: ANO RECTAL MANOMETRY;  Surgeon: Doran Stabler, MD;  Location: [facility];  Service: Gastroenterology;  Laterality: N/A;  . CESAREAN SECTION     x 2  . CHOLECYSTECTOMY    . REFRACTIVE SURGERY Bilateral    Current Outpatient Medications on File Prior to Visit  Medication Sig Dispense Refill  . albuterol (PROAIR HFA) 108 (90 Base) MCG/ACT inhaler INHALE 2 PUFFS BY MOUTH EVERY 6 HOURS AS NEEDED FOR SHORTNESS OF BREATH 18 g 3  . enalapril (VASOTEC) 10 MG tablet Take 10 mg by mouth daily.    . furosemide (LASIX) 40 MG tablet Take 40 mg by mouth. Every other day    . Insulin Human (INSULIN PUMP) SOLN Inject 1 each into the skin 3 times daily with meals, bedtime and 2 AM. 38 units/day basal; bolus sliding scale    . lamoTRIgine (LAMICTAL) 100 MG tablet Take 100 mg by mouth 2 (two) times daily.    Marland Kitchen lamoTRIgine (LAMICTAL) 200 MG tablet Take 200 mg by mouth 2 (two) times daily.     Marland Kitchen linaclotide (LINZESS) 145 MCG CAPS capsule Take 1 capsule (145 mcg total) by mouth daily before breakfast. 30 capsule 3  . Lurasidone HCl (LATUDA) 120 MG TABS Take 1 tablet by mouth daily.    . mometasone (ELOCON) 0.1 % ointment Apply topically daily. 45 g 0  . mometasone-formoterol (DULERA) 100-5 MCG/ACT AERO Inhale 2 puffs into the lungs 2 (two) times daily. 1 Inhaler 5  .  montelukast (SINGULAIR) 10 MG tablet TAKE 1 TABLET BY MOUTH ONCE DAILY AT BEDTIME 90 tablet 9  . NONFORMULARY OR COMPOUNDED ITEM [facility]  Onychomycosis Nail Lacquer -  Fluconazole 2%, Terbinafine 1% DMSO Apply to affected nail once daily Qty. 120 gm 3 refills    . ondansetron (ZOFRAN) 4 MG tablet Take 1 tablet (4 mg total) by mouth every 8 (eight) hours as needed for nausea or vomiting. 60 tablet 2  . phenazopyridine (PYRIDIUM) 200 MG tablet Take 1 tablet (  200 mg total) by mouth 3 (three) times daily as needed for pain. 10 tablet 0  . PROAIR HFA 108 (90 Base) MCG/ACT inhaler INHALE 2 PUFFS BY MOUTH EVERY 6 HOURS AS NEEDED FOR  SHORTNESS  OF  BREATH 9 each 0  . prochlorperazine (COMPAZINE) 25 MG suppository Place 1 suppository (25 mg total) rectally daily as needed for nausea or vomiting. 6 suppository 0  . traZODone (DESYREL) 50 MG tablet Take 50 mg by mouth at bedtime.   0  . albuterol (PROVENTIL) (2.5 MG/3ML) 0.083% nebulizer solution Take 3 mLs (2.5 mg total) by nebulization every 4 (four) hours as needed for wheezing or shortness of breath. 30 vial 1   No current facility-administered medications on file prior to visit.    Allergies  Allergen Reactions  . Ceftin Rash   Social History   Socioeconomic History  . Marital status: Legally Separated    Spouse name: Not on file  . Number of children: 2  . Years of education: Not on file  . Highest education level: Not on file  Occupational History  . Occupation: Materials engineer  Social Needs  . Financial resource strain: Not on file  . Food insecurity:    Worry: Not on file    Inability: Not on file  . Transportation needs:    Medical: Not on file    Non-medical: Not on file  Tobacco Use  . Smoking status: Never Smoker  . Smokeless tobacco: Never Used  Substance and Sexual Activity  . Alcohol use: No  . Drug use: No  . Sexual activity: Yes    Partners: Male  Lifestyle  . Physical activity:    Days per week: Not on  file    Minutes per session: Not on file  . Stress: Not on file  Relationships  . Social connections:    Talks on phone: Not on file    Gets together: Not on file    Attends religious service: Not on file    Active member of club or organization: Not on file    Attends meetings of clubs or organizations: Not on file    Relationship status: Not on file  . Intimate partner violence:    Fear of current or ex partner: Not on file    Emotionally abused: Not on file    Physically abused: Not on file    Forced sexual activity: Not on file  Other Topics Concern  . Not on file  Social History Narrative   Pt has a daughter and boyfriend.            Review of Systems  All other systems reviewed and are negative.      Objective:   Physical Exam  Cardiovascular: Normal rate and regular rhythm.  Pulmonary/Chest: Effort normal and breath sounds normal.  Musculoskeletal:       Left foot: There is deformity.       Feet:  Vitals reviewed.         Assessment & Plan:  Callous ulcer, limited to breakdown of skin ([place])  Type 1 diabetes mellitus with diabetic neuropathy, unspecified ([place])  The ulcer has healed.  There is now only residual callus from pressure.  She has an appointment next week to see podiatry to discuss diabetic foot wear and possibly custom insoles and inserts for her shoes to offload the pressure from these affected areas.  Further debridement is necessary.  I will try to treat the diabetic neuropathy/pain in her feet  with gabapentin 300 mg p.o. 3 times daily.  Follow-up with podiatry as planned

## 2018-05-23 ENCOUNTER — Telehealth: Payer: Self-pay | Admitting: Gastroenterology

## 2018-05-23 ENCOUNTER — Other Ambulatory Visit: Payer: Self-pay

## 2018-05-23 MED ORDER — PROMETHAZINE HCL 25 MG PO TABS: 12.5000 mg | ORAL_TABLET | Freq: Every day | ORAL | 1 refills | Status: DC | PRN

## 2018-05-23 NOTE — Telephone Encounter (Signed)
Phenergan has a potential interaction with the lurasidone, similar to my concerns about the metoclopramide.   Could cause neurologic and psychiatric side effects including, but not limited to: abnormal muscle movements and facial muscle/tongue twitching, high temperature, muscle rigidity, psychiatric disturbance, daytime fatigue/somnolence, sleep disturbance.  Full side effect profile available in paperwork from pharmacy that would accompany any prescription.  That risk is most likely lower with phenergan than it is with metoclopramide (reglan).  If she feels symptoms severe enough, and accept those risks, then I will prescribe low dose phenergan for occasional use.  Please document her response.  If agreeable to above, prescribe phenergan 25 mg tablet  Sig: one half tablet daily as needed for nausea.  Disp #30, RF 1  Make follow up office visit with me.

## 2018-05-23 NOTE — Telephone Encounter (Signed)
Patient states medication zofran is not seeming to help and wants to know if she could be prescribed phenergan instead.

## 2018-05-23 NOTE — Telephone Encounter (Signed)
Patient states that the Zofran even with 8 mg every 8 hours is not helping. She states that when she was pregnant she had to switch to phenergan, which worked well for her. She used the oral phenergan. She had also tried the compazine but it doesn't work as well. Please advise.

## 2018-05-23 NOTE — Telephone Encounter (Signed)
Spoke to patient verbally went over the risks of potential interactions with phenergan and lurasidone. Patient verbalized understanding and still wishes to try the phenergan. Advised her to read over the insert from the paperwork that will be with her prescription. Rx sent as prescribed to her pharmacy. Follow up appointment made for 06/03/18.

## 2018-05-24 ENCOUNTER — Ambulatory Visit: Payer: Medicaid Other | Admitting: Podiatry

## 2018-05-24 ENCOUNTER — Ambulatory Visit (INDEPENDENT_AMBULATORY_CARE_PROVIDER_SITE_OTHER): Payer: Medicaid Other

## 2018-05-24 ENCOUNTER — Encounter: Payer: Self-pay | Admitting: Podiatry

## 2018-05-24 DIAGNOSIS — M79674 Pain in right toe(s): Secondary | ICD-10-CM

## 2018-05-24 DIAGNOSIS — B351 Tinea unguium: Secondary | ICD-10-CM | POA: Diagnosis not present

## 2018-05-24 DIAGNOSIS — E1142 Type 2 diabetes mellitus with diabetic polyneuropathy: Secondary | ICD-10-CM | POA: Diagnosis not present

## 2018-05-24 DIAGNOSIS — L97519 Non-pressure chronic ulcer of other part of right foot with unspecified severity: Principal | ICD-10-CM

## 2018-05-24 DIAGNOSIS — L97513 Non-pressure chronic ulcer of other part of right foot with necrosis of muscle: Secondary | ICD-10-CM

## 2018-05-24 DIAGNOSIS — L97514 Non-pressure chronic ulcer of other part of right foot with necrosis of bone: Secondary | ICD-10-CM | POA: Diagnosis not present

## 2018-05-24 DIAGNOSIS — E11621 Type 2 diabetes mellitus with foot ulcer: Secondary | ICD-10-CM

## 2018-05-24 DIAGNOSIS — M79675 Pain in left toe(s): Secondary | ICD-10-CM

## 2018-05-24 MED ORDER — DOXYCYCLINE HYCLATE 100 MG PO CAPS: 100.0000 mg | ORAL_CAPSULE | Freq: Two times a day (BID) | ORAL | 0 refills | Status: DC

## 2018-05-27 ENCOUNTER — Encounter: Payer: Self-pay | Admitting: Podiatry

## 2018-05-27 NOTE — Progress Notes (Signed)
Subjective: [name] is a 32 y.o. y.o. female who presents sent by her PCP who noticed calluses both feet which needed evaluating. She was last seen here in clinic in April 2018.   She has 27 year history of diabetes. Both she and her brother were diagnosed in childhood. Her brother has passed away from renal failure. The family believes they both contracted a virus which contributed to their diabetes.  She notes pain in right great toe on today. She denies any drainage, edema, or erythema.   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  Last A1C was 7.1 about 2 months ago BS this morning was 101 mg/dl   Objective: Vascular Examination: Capillary refill time <3 seconds x 10 digits Dorsalis pedis and Posterior tibial pulses present b/l No digital hair x 10 digits Skin temperature gradient WNL.  Dermatological Examination: Skin with normal turgor, texture and tone b/l Toenails 1-5 b/l discolored, thick, dystrophic with subungual debris and pain with palpation to nailbeds due to thickness of nails. Hyperkeratotic lesion plantarmedial right great toe with subdermal hemorrhage measuring 1.0 x 1.0 cm with 0.3 cm hyperkeratotic elevation. Postdebridement revealed superifical ulceration measuring 0.5 x 0.5 x 0.1 cm. No edema, no erythema.Base is red, granular with good bleeding. No lymphangitis.  Musculoskeletal: Muscle strength 5/5 to all LE muscle groups  Neurological: Sensation diminished with 10 gram monofilament. Vibratory sensation diminished  Assessment: 1. Painful onychomycosis toenails 1-5 b/l 2.  Diabetic ulcer right great toe 3.  NIDDM  Xray right foot: Spur noted medial aspect left great toe at IPJ. No bone destruction. No gas in tissues.   Plan: 1. Xray right foot. Reviewed xrays with patient. 2   Ulcer debrided right great toe. Toe cleansed with wound cleanser. Iodosorb gel and light dressing applied.  Patient instructed to wear her boot which she has at home. Instructed her to change dressing once daily with Iodosorb gel. She is to keep right foot dry until she sees me in one week. 3. Toenails 1-5 b/l were debrided in length and girth without iatrogenic bleeding. 4. Doxycycline 100 mg po bid x one week. 5. Patient has Medicaid and has no diabetic shoe benefits. Instructed her to purchase Skechers with memory foam insoles. Will further explore her benefits. 6.  Patient will need closer follow up to prevent new wounds. Explained to her she should be seen everyPatient to report any new pedal injuries to medical professional  7. Follow up 1 week. 8.  Patient/POA to call should there be a concern in the interim.

## 2018-05-30 ENCOUNTER — Encounter: Payer: Self-pay | Admitting: Podiatry

## 2018-05-31 ENCOUNTER — Ambulatory Visit: Payer: Medicaid Other | Admitting: Podiatry

## 2018-05-31 ENCOUNTER — Encounter: Payer: Self-pay | Admitting: Podiatry

## 2018-05-31 DIAGNOSIS — L97511 Non-pressure chronic ulcer of other part of right foot limited to breakdown of skin: Secondary | ICD-10-CM | POA: Diagnosis not present

## 2018-05-31 DIAGNOSIS — L97519 Non-pressure chronic ulcer of other part of right foot with unspecified severity: Principal | ICD-10-CM

## 2018-05-31 DIAGNOSIS — E11621 Type 2 diabetes mellitus with foot ulcer: Secondary | ICD-10-CM

## 2018-06-01 ENCOUNTER — Encounter: Payer: Self-pay | Admitting: Podiatry

## 2018-06-01 NOTE — Progress Notes (Signed)
Subjective: [name] is seen today for follow up right hallux ulceration.  She states she did purchase her Skechers shoes and has been wearing them. She has been wearing her walking boot. Mostly with exception of driving which requires she wear a shoe.  She still has a few days of antibiotics left. She relates one instance where she almost got her foot wet, but remembered and protected it.  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  Last A1C was 7.1 about 2 months ago BS this morning was 111 mg/dl   Objective: Vascular Examination: Capillary refill time <3 seconds x 10 digits Dorsalis pedis and Posterior tibial pulses present b/l No digital hair x 10 digits Skin temperature gradient WNL.  Dermatological Examination: Skin with normal turgor, texture and tone b/l Toenails 1-5 b/l discolored, thick, dystrophic with subungual debris and pain with palpation to nailbeds due to thickness of nails. Predebridement Ulceration right hallux IPJ: plantarmedial right great toe measuring 0.5 x 0.5 cm x 0.1 cm. No undermining, no tunneling, no drainage, no odor, no cellulitis.  Postdebridement Ulceration right hallux IPJ: 0.3 x 0.3 x 0.1 cm red granular base. No edema, no erythema with good bleeding. No undermining, no tunneling, no drainage, no odor, no cellulitis.  preulcerative callus submet 1 right foot  Musculoskeletal: Muscle strength 5/5 to all LE muscle groups  Neurological: Sensation diminished with 10 gram monofilament. Vibratory sensation diminished  Assessment: 1. Diabetic ulcer right great toe, nearly healed. 2. Preulcerative Callus submet 1 right foot 3.  NIDDM with neuropathy   Plan: 1.  Ulcer debrided right great toe. Toe cleansed with wound cleanser. Betadine Ointment and light dressing applied. Patient instructed to continue to wear her boot which she has at home. Instructed her to continue to change  dressing once daily with Iodosorb gel. She is to continue to keep right foot dry. Iatrogenic laceration occurred during debridement of submet 1 preulcerative  Callus. Cleansed with Wound cleanser. Triple antibiotic ointment and bandaid applied. She is to apply Neosporin to area once daily.  2. Continue Doxycycline 100 mg po bid until all gone. 3. Dispense toe pad with silicone pad to protect digit when she wears her Skecher shoes. 4.  Discussed with her this will be a chronic problem due to bony deformity of toe. She would like to discuss definitive options. She would like to discuss surgical options with [name] once ulcer heals  5. Patient/POA to call should she experience any redness, swelling, pain in her feet, fever, chills,  there be a concern in the interim.

## 2018-06-03 ENCOUNTER — Encounter: Payer: Self-pay | Admitting: Gastroenterology

## 2018-06-03 ENCOUNTER — Ambulatory Visit: Payer: Medicaid Other | Admitting: Gastroenterology

## 2018-06-03 VITALS — BP 120/70 | HR 88 | Ht 66.25 in | Wt 194.1 lb

## 2018-06-03 DIAGNOSIS — K5902 Outlet dysfunction constipation: Secondary | ICD-10-CM | POA: Diagnosis not present

## 2018-06-03 DIAGNOSIS — G43A0 Cyclical vomiting, not intractable: Secondary | ICD-10-CM

## 2018-06-03 DIAGNOSIS — E1143 Type 2 diabetes mellitus with diabetic autonomic (poly)neuropathy: Secondary | ICD-10-CM | POA: Diagnosis not present

## 2018-06-03 DIAGNOSIS — R140 Abdominal distension (gaseous): Secondary | ICD-10-CM

## 2018-06-03 DIAGNOSIS — K3184 Gastroparesis: Secondary | ICD-10-CM

## 2018-06-03 DIAGNOSIS — R1115 Cyclical vomiting syndrome unrelated to migraine: Secondary | ICD-10-CM

## 2018-06-03 DIAGNOSIS — K5909 Other constipation: Secondary | ICD-10-CM

## 2018-06-03 DIAGNOSIS — K5641 Fecal impaction: Secondary | ICD-10-CM

## 2018-06-03 NOTE — Progress Notes (Signed)
[facility] Progress Note  Chief Complaint: Chronic constipation and nausea and vomiting  Subjective  History:  This is a 32 year old woman who follows up for the first time since late May.  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  She was offered biofeedback, but said her symptoms were well controlled on once daily Linzess.  She has chronic nausea and vomiting from diabetic related gastroparesis.  I did not renew her metoclopramide prescription several months ago because she is on [place], and I was concerned about the additive risks of neurologic side effects including tardive dyskinesia.  She called in early July with upper abdominal pain and bloating and fullness.  We gave her information on how to possibly obtain domperidone from [facility]. (EKG with normal QTC in January 2018) She then called back about 2 weeks ago stating that ondansetron 8 mg every 8 hours was not helping very much and she preferred to take Phenergan.  Compazine had not been very helpful either.  See phone note for details on my advice regarding the potential drug interaction and side effect of Phenergan and lurasidone.  Tashaya has ongoing trouble with nausea and vomiting as well as some recent weight gain of 10 pounds.  She will sometimes have relief from Phenergan.  Sometimes there are episodes of protracted upper abdominal pain with vomiting lasting a day or 2.  She did not look into the logistics of cost of domperidone because she knows she will not be able to afford it.  She is a single mother with no current income.  She is also still bothered by chronic constipation and needs to take a second dose of Linzess 145 mcg a few days a week.  She would prefer to try increased dosing of this as needed rather than pursue biofeedback. Blood glucose has reportedly been under good control lately.  ROS: Cardiovascular:  no chest  pain Respiratory: no dyspnea Remainder of systems negative except as above The patient's Past Medical, Family and Social History were reviewed and are on file in the EMR.  Objective:  Med list reviewed  Current Outpatient Medications:  .  albuterol (PROAIR HFA) 108 (90 Base) MCG/ACT inhaler, INHALE 2 PUFFS BY MOUTH EVERY 6 HOURS AS NEEDED FOR SHORTNESS OF BREATH, Disp: 18 g, Rfl: 3 .  albuterol (PROVENTIL) (2.5 MG/3ML) 0.083% nebulizer solution, Take 3 mLs (2.5 mg total) by nebulization every 4 (four) hours as needed for wheezing or shortness of breath. (Patient taking differently: Take 2.5 mg by nebulization as needed for wheezing or shortness of breath. ), Disp: 30 vial, Rfl: 1 .  enalapril (VASOTEC) 10 MG tablet, Take 10 mg by mouth daily., Disp: , Rfl:  .  furosemide (LASIX) 40 MG tablet, Take 40 mg by mouth. Every other day, Disp: , Rfl:  .  gabapentin (NEURONTIN) 300 MG capsule, Take 1 capsule (300 mg total) by mouth 3 (three) times daily., Disp: 90 capsule, Rfl: 3 .  Insulin Human (INSULIN PUMP) SOLN, Inject 1 each into the skin 3 times daily with meals, bedtime and 2 AM. 38 units/day basal; bolus sliding scale, Disp: , Rfl:  .  lamoTRIgine (LAMICTAL) 100 MG tablet, Take 100 mg by mouth 2 (two) times daily., Disp: , Rfl:  .  lamoTRIgine (LAMICTAL) 200 MG tablet, Take 200 mg by mouth 2 (two) times daily. , Disp: , Rfl:  .  linaclotide (LINZESS) 145 MCG CAPS capsule, Take 1  capsule (145 mcg total) by mouth daily before breakfast., Disp: 30 capsule, Rfl: 3 .  Lurasidone HCl (LATUDA) 120 MG TABS, Take 1 tablet by mouth daily., Disp: , Rfl:  .  mometasone (ELOCON) 0.1 % ointment, Apply topically daily., Disp: 45 g, Rfl: 0 .  mometasone-formoterol (DULERA) 100-5 MCG/ACT AERO, Inhale 2 puffs into the lungs 2 (two) times daily., Disp: 1 Inhaler, Rfl: 5 .  montelukast (SINGULAIR) 10 MG tablet, TAKE 1 TABLET BY MOUTH ONCE DAILY AT BEDTIME, Disp: 90 tablet, Rfl: 9 .  NONFORMULARY OR COMPOUNDED  ITEM, [facility]  Onychomycosis Nail Lacquer -  Fluconazole 2%, Terbinafine 1% DMSO Apply to affected nail once daily Qty. 120 gm 3 refills, Disp: , Rfl:  .  ondansetron (ZOFRAN) 4 MG tablet, Take 1 tablet (4 mg total) by mouth every 8 (eight) hours as needed for nausea or vomiting., Disp: 60 tablet, Rfl: 2 .  PROAIR HFA 108 (90 Base) MCG/ACT inhaler, INHALE 2 PUFFS BY MOUTH EVERY 6 HOURS AS NEEDED FOR  SHORTNESS  OF  BREATH, Disp: 9 each, Rfl: 0 .  prochlorperazine (COMPAZINE) 25 MG suppository, Place 1 suppository (25 mg total) rectally daily as needed for nausea or vomiting., Disp: 6 suppository, Rfl: 0 .  promethazine (PHENERGAN) 25 MG tablet, Take 0.5 tablets (12.5 mg total) by mouth daily as needed for nausea or vomiting., Disp: 30 tablet, Rfl: 1 .  traZODone (DESYREL) 50 MG tablet, Take 50 mg by mouth at bedtime. , Disp: , Rfl: 0   Vital signs in last 24 hrs: Vitals:   06/03/18 1315  BP: 120/70  Pulse: 88    Physical Exam  Not acutely ill-appearing  HEENT: sclera anicteric, oral mucosa moist without lesions  Neck: supple, no thyromegaly, JVD or lymphadenopathy  Cardiac: RRR without murmurs, S1S2 heard, no peripheral edema  Pulm: clear to auscultation bilaterally, normal RR and effort noted  Abdomen: soft, no tenderness, with active bowel sounds. No guarding or palpable hepatosplenomegaly.  Skin; warm and dry, no jaundice or rash  Recent Labs:  Anal rectal manometry results as noted above and documented in chart   @ASSESSMENTPLANBEGIN @ Assessment: Encounter Diagnoses  Name Primary?  . 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 Abdominal bloating   . Non-intractable cyclical vomiting with nausea   . Physiologic obstructive defecation   . Chronic constipation   . Gastroparesis    I still do not feel I can safely use metoclopramide for the reasons noted above.  I do not know how to get around the financial constraints in consideration of domperidone.  It is possible  that the GI motility clinic at [facility] may have a research protocol or other compassionate use access to this medication.  She is interested in a consult with them, so we sent the referral.  To that end, I also scheduled a gastric emptying study because her last one was about 10 years ago.  I have also continue the [facility] and given her some samples to allow for a second dose as needed.  Continue Phenergan.  We again discussed the possible neurologic side effect in risk along with the lurasidone and she would like to continue it because it is been helpful at times.  Total time 30 minutes, over half spent face-to-face with patient in counseling and coordination of care.   Nelida Meuse III

## 2018-06-03 NOTE — Patient Instructions (Addendum)
If you are age 33 or older, your body mass index should be between 23-30. Your Body mass index is 31.1 kg/m. If this is out of the aforementioned range listed, please consider follow up with your Primary Care Provider.  If you are age 17 or younger, your body mass index should be between 19-25. Your Body mass index is 31.1 kg/m. If this is out of the aformentioned range listed, please consider follow up with your Primary Care Provider.   You have been scheduled for a gastric emptying scan at [facility] on 06-13-2018 at 730am. Please arrive at least 15 minutes prior to your appointment for registration. Please make certain not to have anything to eat or drink after midnight the night before your test. Hold all stomach medications (ex: Zofran, phenergan, Reglan) 48 hours prior to your test. If you need to reschedule your appointment, please contact radiology scheduling at [phone]. _____________________________________________________________________ A gastric-emptying study measures how long it takes for food to move through your stomach. There are several ways to measure stomach emptying. In the most common test, you eat food that contains a small amount of radioactive material. A scanner that detects the movement of the radioactive material is placed over your abdomen to monitor the rate at which food leaves your stomach. This test normally takes about 4 hours to complete. _____________________________________________________________________    It was a pleasure to see you today!  Dr. Loletha Carrow

## 2018-06-05 ENCOUNTER — Telehealth: Payer: Self-pay

## 2018-06-05 NOTE — Telephone Encounter (Signed)
Records faxed to [facility] Dr Derrill Kay. Will await appointment info

## 2018-06-06 ENCOUNTER — Ambulatory Visit (INDEPENDENT_AMBULATORY_CARE_PROVIDER_SITE_OTHER): Payer: Medicaid Other | Admitting: Podiatry

## 2018-06-06 DIAGNOSIS — L97521 Non-pressure chronic ulcer of other part of left foot limited to breakdown of skin: Secondary | ICD-10-CM | POA: Diagnosis not present

## 2018-06-06 DIAGNOSIS — E1149 Type 2 diabetes mellitus with other diabetic neurological complication: Secondary | ICD-10-CM

## 2018-06-06 DIAGNOSIS — L97519 Non-pressure chronic ulcer of other part of right foot with unspecified severity: Principal | ICD-10-CM

## 2018-06-06 DIAGNOSIS — E11621 Type 2 diabetes mellitus with foot ulcer: Secondary | ICD-10-CM

## 2018-06-06 MED ORDER — CLINDAMYCIN HCL 300 MG PO CAPS: 300.0000 mg | ORAL_CAPSULE | Freq: Three times a day (TID) | ORAL | 0 refills | Status: DC

## 2018-06-06 NOTE — Progress Notes (Signed)
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  She states that the wound is about the same compared to what it was when she last saw Dr. Elisha Ponder.  She keep antibiotic ointment on the area daily.  She finished a course of doxycycline.  Denies any drainage or pus.  No other concerns. Denies any systemic complaints such as fevers, chills, nausea, vomiting. No acute changes since last appointment, and no other complaints at this time.   Objective: AAO x3, NAD DP/PT pulses palpable bilaterally, CRT less than 3 seconds Ulceration continues on the plantar medial aspect of the right hallux measuring approximate 0.3 x 0.2 cm and superficial with a granular wound base.  There is no probing.  There is mild edema to the hallux compared to the contralateral extremity.  Minimal surrounding erythema there is no ascending cellulitis.  No fluctuation or crepitation or any malodor.  Pre-ulcerative calluses left medial hallux and first MPJ No open lesions or pre-ulcerative lesions.  No pain with calf compression, swelling, warmth, erythema  Assessment: Healing right hallux  Plan: -All treatment options discussed with the patient including all alternatives, risks, complications.  -I independently reviewed the x-rays with her from previous.  We discussed both conservative as well as surgical options.  At this point out of the wound to heal before even considering surgery.  Restart clindamycin as well as continue antibiotic ointment dressing changes daily and offloading.  She also benefit from diabetic inserts to help offload the area.  A prescription for [facility] was given to her today. -Monitor for any clinical signs or symptoms of infection and directed to call the office immediately should any occur or go to the ER. -Patient encouraged to call the office with any questions, concerns, change in symptoms.   [name]  DPM

## 2018-06-13 ENCOUNTER — Ambulatory Visit (HOSPITAL_COMMUNITY)
Admission: RE | Admit: 2018-06-13 | Discharge: 2018-06-13 | Disposition: A | Discharge status: Home / Self Care | Payer: Medicaid Other | Source: Ambulatory Visit | Attending: Gastroenterology | Admitting: Gastroenterology

## 2018-06-13 DIAGNOSIS — K3184 Gastroparesis: Secondary | ICD-10-CM

## 2018-06-13 MED ORDER — TECHNETIUM TC 99M SULFUR COLLOID
2.1000 | Freq: Once | INTRAVENOUS | Status: AC | PRN
  Administered 2018-06-13: 2.1 via INTRAVENOUS

## 2018-06-16 ENCOUNTER — Encounter: Payer: Self-pay | Admitting: Podiatry

## 2018-06-16 NOTE — Progress Notes (Signed)
Patient called for concerns of worsening swelling and pain to her ulceration. Present for 3 days, but today is worse than precious.  Denies N/V/F/Ch and otherwise feeling well.   Will add on Cipro and will have patient f/u in the office tomorrow. Advised should symptoms worsen or she start to feel sick to proceed directly to the ER.

## 2018-06-17 ENCOUNTER — Encounter: Payer: Self-pay | Admitting: Podiatry

## 2018-06-17 ENCOUNTER — Ambulatory Visit: Payer: Medicaid Other | Admitting: Podiatry

## 2018-06-17 ENCOUNTER — Ambulatory Visit (INDEPENDENT_AMBULATORY_CARE_PROVIDER_SITE_OTHER): Payer: Medicaid Other

## 2018-06-17 ENCOUNTER — Other Ambulatory Visit: Payer: Self-pay | Admitting: Podiatry

## 2018-06-17 VITALS — Temp 97.9°F

## 2018-06-17 DIAGNOSIS — L02611 Cutaneous abscess of right foot: Secondary | ICD-10-CM

## 2018-06-17 DIAGNOSIS — L97511 Non-pressure chronic ulcer of other part of right foot limited to breakdown of skin: Secondary | ICD-10-CM | POA: Diagnosis not present

## 2018-06-17 DIAGNOSIS — L03031 Cellulitis of right toe: Secondary | ICD-10-CM

## 2018-06-17 DIAGNOSIS — L97519 Non-pressure chronic ulcer of other part of right foot with unspecified severity: Secondary | ICD-10-CM

## 2018-06-17 DIAGNOSIS — L97512 Non-pressure chronic ulcer of other part of right foot with fat layer exposed: Secondary | ICD-10-CM | POA: Diagnosis not present

## 2018-06-17 MED ORDER — MUPIROCIN 2 % EX OINT: 1.0000 "application " | TOPICAL_OINTMENT | Freq: Two times a day (BID) | CUTANEOUS | 2 refills | Status: DC

## 2018-06-17 MED ORDER — AMOXICILLIN-POT CLAVULANATE 875-125 MG PO TABS: 1.0000 | ORAL_TABLET | Freq: Two times a day (BID) | ORAL | 0 refills | Status: DC

## 2018-06-17 NOTE — Patient Instructions (Signed)
Stop the clindamycin. Continue ciprofloxacin and we will start Augmentin Change the bandage 2 times a day and I sent mupirocin ointment to the pharmacy for you.  Monitor for any signs/symptoms of infection. Call the office immediately if any occur or go directly to the emergency room. Call with any questions/concerns.

## 2018-06-18 ENCOUNTER — Telehealth: Payer: Self-pay | Admitting: Podiatry

## 2018-06-18 ENCOUNTER — Ambulatory Visit: Payer: Medicaid Other | Admitting: Podiatry

## 2018-06-18 LAB — COMPLETE METABOLIC PANEL WITH GFR
AG RATIO: 1.8 (calc) (ref 1.0–2.5)
ALT: 19 U/L (ref 6–29)
AST: 17 U/L (ref 10–30)
Albumin: 3.7 g/dL (ref 3.6–5.1)
Alkaline phosphatase (APISO): 99 U/L (ref 33–115)
BUN/Creatinine Ratio: 23 (calc) — ABNORMAL HIGH (ref 6–22)
BUN: 41 mg/dL — ABNORMAL HIGH (ref 7–25)
CALCIUM: 9 mg/dL (ref 8.6–10.2)
CO2: 24 mmol/L (ref 20–32)
Chloride: 102 mmol/L (ref 98–110)
Creat: 1.81 mg/dL — ABNORMAL HIGH (ref 0.50–1.10)
GFR, EST AFRICAN AMERICAN: 42 mL/min/{1.73_m2} — AB (ref >=60)
GFR, EST NON AFRICAN AMERICAN: 36 mL/min/{1.73_m2} — AB (ref >=60)
GLOBULIN: 2.1 g/dL (ref 1.9–3.7)
Glucose, Bld: 178 mg/dL — ABNORMAL HIGH (ref 65–139)
POTASSIUM: 5.5 mmol/L — AB (ref 3.5–5.3)
Sodium: 132 mmol/L — ABNORMAL LOW (ref 135–146)
TOTAL PROTEIN: 5.8 g/dL — AB (ref 6.1–8.1)
Total Bilirubin: 0.3 mg/dL (ref 0.2–1.2)

## 2018-06-18 LAB — CBC WITH DIFFERENTIAL/PLATELET
Basophils Absolute: 74 cells/uL (ref 0–200)
Basophils Relative: 0.7 %
EOS PCT: 4.8 %
Eosinophils Absolute: 509 cells/uL — ABNORMAL HIGH (ref 15–500)
HEMATOCRIT: 33.7 % — AB (ref 35.0–45.0)
Hemoglobin: 11.1 g/dL — ABNORMAL LOW (ref 11.7–15.5)
LYMPHS ABS: 1696 {cells}/uL (ref 850–3900)
MCH: 28.5 pg (ref 27.0–33.0)
MCHC: 32.9 g/dL (ref 32.0–36.0)
MCV: 86.6 fL (ref 80.0–100.0)
MPV: 11 fL (ref 7.5–12.5)
Monocytes Relative: 7.4 %
NEUTROS PCT: 71.1 %
Neutro Abs: 7537 cells/uL (ref 1500–7800)
Platelets: 328 10*3/uL (ref 140–400)
RBC: 3.89 10*6/uL (ref 3.80–5.10)
RDW: 12.6 % (ref 11.0–15.0)
TOTAL LYMPHOCYTE: 16 %
WBC: 10.6 10*3/uL (ref 3.8–10.8)
WBCMIX: 784 {cells}/uL (ref 200–950)

## 2018-06-18 LAB — SEDIMENTATION RATE: Sed Rate: 6 mm/h (ref 0–20)

## 2018-06-18 LAB — C-REACTIVE PROTEIN: CRP: 2.2 mg/L (ref <8.0)

## 2018-06-18 NOTE — Telephone Encounter (Signed)
I saw Dr. Jacqualyn Posey yesterday and he sent me for lab work. I was calling to see what my creatinine level was? I have an appointment with my nephrologist on Thursday and wanted to let them know. You can call me back at [phone]. Thank you.

## 2018-06-18 NOTE — Telephone Encounter (Signed)
Left message informing pt the labs were in Epic and if her kidney doctor was in the [facility] Epic system they would be able to see the results also, if not to please call again tomorrow and I would be able to give the results to her.

## 2018-06-19 ENCOUNTER — Telehealth: Payer: Self-pay | Admitting: *Deleted

## 2018-06-19 MED ORDER — DOXYCYCLINE HYCLATE 100 MG PO CAPS: 100.0000 mg | ORAL_CAPSULE | Freq: Two times a day (BID) | ORAL | 0 refills | Status: DC

## 2018-06-19 NOTE — Telephone Encounter (Signed)
-----   Message from Trula Slade, DPM sent at 06/19/2018  9:03 AM EDT ----- [name]- please let her know that her infection labs look good but her creatine is up to 1.81. Have her stop the Augmentin and start doxycycline 100mg  BID x 10 days. Please send blood work to her kidney specialist.

## 2018-06-19 NOTE — Telephone Encounter (Signed)
Left message for pt to stop the Augmentin and Ddr. Jacqualyn Posey had ordered Doxycycline 100mg  one capsule bid x 10 days and I had called them to her [facility], and to call to confirm she had received this order message.

## 2018-06-19 NOTE — Progress Notes (Signed)
Subjective: 32 year old female presents the office today for concerns of worsening to the wound on the right big toe.  Over the weekend she called and she was started on ciprofloxacin and she presents today for further evaluation.  She states that the area feels that it needs to be drained due to the increased pressure and pain to the area.  She denies any recent injury or fall.  She is gone back to wearing the cam boot.  Overall she feels well and she denies any systemic complaints including fevers, chills, nausea, vomiting. No acute changes since last appointment, and no other complaints at this time.   Objective: AAO x3, NAD DP/PT pulses palpable bilaterally, CRT less than 3 seconds On the medial aspect of the right hallux is what appears to be a blister distal to the area of the wound.  Appears the wound has healed over.  There is increased edema erythema to the toe but there is no ascending cellulitis.  Upon debridement of the blister area there was purulence identified under this area.  Did debride the skin and there is a superficial granular wound present there is no probing to bone, undermining or tunneling.  There is no crepitation.  No open lesions or pre-ulcerative lesions.  No pain with calf compression, swelling, warmth, erythema  Assessment: Abscess, cellulitis right hallux  Plan: -All treatment options discussed with the patient including all alternatives, risks, complications.  -X-rays obtained reviewed.  No definitive cortical changes suggestive of gastroenteritis at this time. -I debrided the area of concern of the blister.  The purulence was cultured.  Utilizing #312 blade scalpel as well as a tissue nipper to debride all nonviable tissue.  Only superficial wound was present.  Will do mupirocin ointment dressing changes daily.  Continue in the surgical boot.  I will order blood work including comprehensive metabolic panel, ESR, CRP, CBC.  There is any worsening signs or symptoms of  infection she needs to the emergency room 5 in the buttocks.  While she has stopped the clindamycin and start Augmentin.  Finish ciprofloxacin. -Patient encouraged to call the office with any questions, concerns, change in symptoms.   Trula Slade DPM

## 2018-06-19 NOTE — Telephone Encounter (Signed)
Pt states she received the message and will stop the Augmentin.

## 2018-06-19 NOTE — Telephone Encounter (Addendum)
I informed pt of the Creatinine and BUN values and offered to fax the results to her kidney doctor. Pt states her Kidney doctor is Dr. Durward Parcel [phone] with [facility].

## 2018-06-19 NOTE — Telephone Encounter (Signed)
[name] - [facility] states fax is [phone], and thanked me for the fax.

## 2018-06-21 LAB — WOUND CULTURE
MICRO NUMBER: 91075578
SPECIMEN QUALITY:: ADEQUATE

## 2018-06-24 ENCOUNTER — Ambulatory Visit: Payer: Medicaid Other | Admitting: Podiatry

## 2018-06-24 ENCOUNTER — Encounter: Payer: Self-pay | Admitting: Podiatry

## 2018-06-24 DIAGNOSIS — L97514 Non-pressure chronic ulcer of other part of right foot with necrosis of bone: Secondary | ICD-10-CM

## 2018-06-24 DIAGNOSIS — E11621 Type 2 diabetes mellitus with foot ulcer: Secondary | ICD-10-CM

## 2018-06-24 DIAGNOSIS — L97519 Non-pressure chronic ulcer of other part of right foot with unspecified severity: Principal | ICD-10-CM

## 2018-06-24 DIAGNOSIS — E1149 Type 2 diabetes mellitus with other diabetic neurological complication: Secondary | ICD-10-CM

## 2018-06-26 NOTE — Progress Notes (Signed)
Subjective: 32 year old female presents the office today for follow-up evaluation of a wound to the right big toe.  She states that since starting antibiotics she is doing substantially better.  She denies any drainage or pus and denies any pain redness or swelling.  She has been in the cam boot she presents today wearing a regular shoe. Denies any systemic complaints such as fevers, chills, nausea, vomiting. No acute changes since last appointment, and no other complaints at this time.   Objective: AAO x3, NAD DP/PT pulses palpable bilaterally, CRT less than 3 seconds To the medial aspect of the right hallux there is a hyperkeratotic lesion.  Upon debridement it appears the underlying wound is healed however there was dried blood under the callus the area is pre-ulcerative.  There is no fluctuation or crepitation.  There is no blistering present today.  There is no erythema to the toe raising sialitis.  Overall she is doing much better. No open lesions or pre-ulcerative lesions.  No pain with calf compression, swelling, warmth, erythema  Assessment: Healing wound right hallux with resolved infection  Plan: -All treatment options discussed with the patient including all alternatives, risks, complications.  -I did debride the hyperkeratotic lesion to go to any complications or bleeding.  Appears that the wound has healed however want her to continue antibiotic ointment dressing changes daily.  Finish her course of antibiotics.  I reviewed the blood work with her again today as well. -Patient encouraged to call the office with any questions, concerns, change in symptoms.   Trula Slade DPM

## 2018-07-05 ENCOUNTER — Ambulatory Visit: Payer: Medicaid Other | Admitting: Podiatry

## 2018-07-08 ENCOUNTER — Ambulatory Visit (INDEPENDENT_AMBULATORY_CARE_PROVIDER_SITE_OTHER): Payer: Medicaid Other | Admitting: Podiatry

## 2018-07-08 ENCOUNTER — Encounter: Payer: Self-pay | Admitting: Podiatry

## 2018-07-08 DIAGNOSIS — L97519 Non-pressure chronic ulcer of other part of right foot with unspecified severity: Principal | ICD-10-CM

## 2018-07-08 DIAGNOSIS — L97521 Non-pressure chronic ulcer of other part of left foot limited to breakdown of skin: Secondary | ICD-10-CM | POA: Diagnosis not present

## 2018-07-08 DIAGNOSIS — E11621 Type 2 diabetes mellitus with foot ulcer: Secondary | ICD-10-CM

## 2018-07-08 DIAGNOSIS — E1149 Type 2 diabetes mellitus with other diabetic neurological complication: Secondary | ICD-10-CM

## 2018-07-08 NOTE — Progress Notes (Signed)
Subjective: 32 year old female presents the office today for follow-up evaluation of a wound to the right big toe. She states that she is doing well she feels that the wound is healed.  She denies any drainage or pus is having no pain to the area.  She has been using offloading pad wearing regular shoes with any issues.  She has no new concerns today.  Objective: AAO x3, NAD DP/PT pulses palpable bilaterally, CRT less than 3 seconds To the medial aspect of the right hallux there is a hyperkeratotic lesion.  Other than that there is no definitive open lesion identified at this time and the wound appears to be healed.  There is no surrounding erythema, ascending sialitis.  There is no fluctuation or crepitation or any malodor.  There is no clinical signs of infection noted today.  No open lesions or pre-ulcerative lesions.  No pain with calf compression, swelling, warmth, erythema  Assessment: Healing wound right hallux without signs of infection today  Plan: -All treatment options discussed with the patient including all alternatives, risks, complications.  -I did debride the hyperkeratotic lesion to go to any complications or bleeding. There is no open lesion today. Continue with moisturizer to the area daily. Monitor for any clinical signs or symptoms of infection and directed to call the office immediately should any occur or go to the ER. -Return in about 3 weeks (around 07/29/2018).  Trula Slade DPM

## 2018-07-18 ENCOUNTER — Telehealth: Payer: Self-pay | Admitting: Gastroenterology

## 2018-07-18 NOTE — Telephone Encounter (Signed)
FYI - Referral is on a wait list. They are scheduling out to next year. If they have cancellations , they work on the wait list.

## 2018-07-19 NOTE — Telephone Encounter (Signed)
Pt is aware she is on the wait list for [facility], as they are full for the remanding of the year. She would like to try the domperidone as discussed at her last visit.

## 2018-07-22 NOTE — Telephone Encounter (Signed)
Domperidone 10 mg tablet twice daily before breakfast and supper. Disp #60, RF 2  She will need information from [facility] that we have for such patients.   That way she can find out about the cost.  When a written prescription is needed, let me know.  - HD

## 2018-07-25 NOTE — Telephone Encounter (Signed)
Left a message to return call.  

## 2018-07-29 ENCOUNTER — Ambulatory Visit: Payer: Medicaid Other | Admitting: Podiatry

## 2018-07-29 ENCOUNTER — Encounter: Payer: Self-pay | Admitting: Podiatry

## 2018-07-29 DIAGNOSIS — L84 Corns and callosities: Secondary | ICD-10-CM | POA: Diagnosis not present

## 2018-07-29 DIAGNOSIS — L97519 Non-pressure chronic ulcer of other part of right foot with unspecified severity: Secondary | ICD-10-CM | POA: Diagnosis not present

## 2018-07-29 DIAGNOSIS — E11621 Type 2 diabetes mellitus with foot ulcer: Secondary | ICD-10-CM | POA: Diagnosis not present

## 2018-07-29 DIAGNOSIS — E1149 Type 2 diabetes mellitus with other diabetic neurological complication: Secondary | ICD-10-CM | POA: Diagnosis not present

## 2018-07-30 ENCOUNTER — Encounter: Payer: Self-pay | Admitting: Emergency Medicine

## 2018-07-30 ENCOUNTER — Other Ambulatory Visit: Payer: Self-pay | Admitting: Gastroenterology

## 2018-07-30 DIAGNOSIS — E1143 Type 2 diabetes mellitus with diabetic autonomic (poly)neuropathy: Secondary | ICD-10-CM

## 2018-07-30 DIAGNOSIS — K3184 Gastroparesis: Principal | ICD-10-CM

## 2018-07-30 MED ORDER — AMBULATORY NON FORMULARY MEDICATION: 2 refills | Status: DC

## 2018-07-31 NOTE — Progress Notes (Signed)
Subjective: 32 year old female presents the office today for follow-up evaluation of a wound on the right hallux.  She says that overall she is doing better she still gets a callus on the area but the wound is healed.  She is scheduled to get her diabetic shoes, inserts soon.  She denies any swelling or redness to her feet overall she is doing well.  She states that she was to hold off on surgery to see how his shoes do. Denies any systemic complaints such as fevers, chills, nausea, vomiting. No acute changes since last appointment, and no other complaints at this time.   Objective: AAO x3, NAD DP/PT pulses palpable bilaterally, CRT less than 3 seconds Decreased sensation with Semmes Weinstein monofilament.   Hyperkeratotic lesion to the medial right hallux.  Upon debridement there is no underlying ulceration, drainage or any signs of infection and the wound appears to be healed.  Hyperkeratotic lesions  medial first MPJ.  No underlying ulcerations identified bilaterally.   No other open lesions or pre-ulcerative lesions.  No pain with calf compression, swelling, warmth, erythema  Assessment: Healed wound right side with pre-ulcerative calluses bilaterally  Plan: -All treatment options discussed with the patient including all alternatives, risks, complications.  -I debrided the wound as well as the hyperkeratotic tissues without any complications or bleeding.  Appears that the wound is healed.  We discussed both conservative as well as surgical treatment options and she was to hold off any surgery at this time and she would like to see how the shoes, inserts are doing.  We discussed moisturizer daily. -Patient encouraged to call the office with any questions, concerns, change in symptoms.   Trula Slade DPM

## 2018-08-01 ENCOUNTER — Other Ambulatory Visit: Payer: Self-pay

## 2018-08-01 MED ORDER — AMBULATORY NON FORMULARY MEDICATION: 2 refills | Status: DC

## 2018-08-06 ENCOUNTER — Ambulatory Visit (INDEPENDENT_AMBULATORY_CARE_PROVIDER_SITE_OTHER): Payer: Self-pay | Admitting: Psychiatry

## 2018-08-06 DIAGNOSIS — F3162 Bipolar disorder, current episode mixed, moderate: Secondary | ICD-10-CM

## 2018-08-06 NOTE — Progress Notes (Signed)
[facility]  Patient ID: [name],  MRN: [id number]  PCP: [name], MD  Date of Evaluation: 08/06/2018 Time spent:20 minutes  Chief Complaint:   HISTORY/CURRENT STATUS: HPI patient is a 32 year old white female last seen 07/05/2018.  Time her depression was slightly better he was having occasional manic symptoms felt like her anxiety was worse.  We started her on Equetro. Currently she says she feels  Better .  We continued her on her Lamictal, Latuda, trazodone, BuSpar.   Individual Medical History/ Review of Systems: Changes? : no Allergies: Ceftin  n sCurrent Medications:  Medication Side Effects: none  Family Medical/ Social History: Changes? No  MENTAL HEALTH EXAM:  There were no vitals taken for this visit.There is no height or weight on file to calculate BMI.  General Appearance: Casual  Eye Contact:  Good  Speech:  Normal Rate  Volume:  Normal  Mood:  Euthymic  Affect:  Appropriate  Thought Process:  Linear  Orientation:  Full (Time, Place, and Person)  Thought Content: Logical   Suicidal Thoughts:  No  Homicidal Thoughts:  No  Memory:  normal  Judgement:  Good  Insight:  NA and Good  Psychomotor Activity:  Normal  Concentration:  Concentration: Good  Recall:  Good  Fund of Knowledge: Good  Language: Good  Assets:  Desire for Improvement  ADL's:  Intact  Cognition: WNL  Prognosis:  Good    DIAGNOSES:    ICD-10-CM   1. Bipolar 1 disorder, mixed, moderate (HCC) F31.62     Receiving Psychotherapy: no   RECOMMENDATIONS: All her prescriptions had to be hand written.  We increased her left Equetro from 200 mg twice a day, to 400 in the morning and 200 in the evening.  Also on Lamictal 300 mg twice daily Latuda 120-day.  Trazodone 51-2 tabs at bed.  BuSpar 15 mg twice daily  I will see her again in 1 month  [name], PA-C

## 2018-08-12 ENCOUNTER — Emergency Department (HOSPITAL_COMMUNITY)
Admission: EM | Admit: 2018-08-12 | Discharge: 2018-08-12 | Disposition: A | Discharge status: Home / Self Care | Payer: Medicaid Other | Attending: Emergency Medicine | Admitting: Emergency Medicine

## 2018-08-12 ENCOUNTER — Other Ambulatory Visit: Payer: Self-pay

## 2018-08-12 ENCOUNTER — Encounter (HOSPITAL_COMMUNITY): Payer: Self-pay | Admitting: Emergency Medicine

## 2018-08-12 ENCOUNTER — Emergency Department (HOSPITAL_COMMUNITY): Payer: Medicaid Other

## 2018-08-12 DIAGNOSIS — R079 Chest pain, unspecified: Secondary | ICD-10-CM | POA: Diagnosis present

## 2018-08-12 DIAGNOSIS — E109 Type 1 diabetes mellitus without complications: Secondary | ICD-10-CM | POA: Diagnosis not present

## 2018-08-12 DIAGNOSIS — I10 Essential (primary) hypertension: Secondary | ICD-10-CM | POA: Diagnosis not present

## 2018-08-12 DIAGNOSIS — E103219 Type 1 diabetes mellitus with mild nonproliferative diabetic retinopathy with macular edema, unspecified eye: Secondary | ICD-10-CM | POA: Insufficient documentation

## 2018-08-12 DIAGNOSIS — Z794 Long term (current) use of insulin: Secondary | ICD-10-CM | POA: Diagnosis not present

## 2018-08-12 DIAGNOSIS — Z79899 Other long term (current) drug therapy: Secondary | ICD-10-CM | POA: Insufficient documentation

## 2018-08-12 LAB — TROPONIN I

## 2018-08-12 LAB — CBC
HCT: 37.5 % (ref 36.0–46.0)
Hemoglobin: 11.7 g/dL — ABNORMAL LOW (ref 12.0–15.0)
MCH: 28.6 pg (ref 26.0–34.0)
MCHC: 31.2 g/dL (ref 30.0–36.0)
MCV: 91.7 fL (ref 80.0–100.0)
Platelets: 262 10*3/uL (ref 150–400)
RBC: 4.09 MIL/uL (ref 3.87–5.11)
RDW: 13.9 % (ref 11.5–15.5)
WBC: 5.3 10*3/uL (ref 4.0–10.5)
nRBC: 0 % (ref 0.0–0.2)

## 2018-08-12 LAB — BASIC METABOLIC PANEL
ANION GAP: 8 (ref 5–15)
BUN: 22 mg/dL — AB (ref 6–20)
CALCIUM: 8.5 mg/dL — AB (ref 8.9–10.3)
CO2: 25 mmol/L (ref 22–32)
CREATININE: 1.5 mg/dL — AB (ref 0.44–1.00)
Chloride: 100 mmol/L (ref 98–111)
GFR calc Af Amer: 52 mL/min — ABNORMAL LOW (ref >60)
GFR, EST NON AFRICAN AMERICAN: 45 mL/min — AB (ref >60)
GLUCOSE: 344 mg/dL — AB (ref 70–99)
Potassium: 4.2 mmol/L (ref 3.5–5.1)
Sodium: 133 mmol/L — ABNORMAL LOW (ref 135–145)

## 2018-08-12 LAB — URINALYSIS, ROUTINE W REFLEX MICROSCOPIC
Bilirubin Urine: NEGATIVE
Glucose, UA: >=500 mg/dL — AB
HGB URINE DIPSTICK: NEGATIVE
Ketones, ur: NEGATIVE mg/dL
LEUKOCYTES UA: NEGATIVE
Nitrite: NEGATIVE
PROTEIN: NEGATIVE mg/dL
Specific Gravity, Urine: 1.008 (ref 1.005–1.030)
pH: 6 (ref 5.0–8.0)

## 2018-08-12 LAB — D-DIMER, QUANTITATIVE (NOT AT ARMC): D DIMER QUANT: 0.3 ug{FEU}/mL (ref 0.00–0.50)

## 2018-08-12 LAB — I-STAT BETA HCG BLOOD, ED (NOT ORDERABLE): I-stat hCG, quantitative: <5 m[IU]/mL (ref <5)

## 2018-08-12 LAB — GLUCOSE, CAPILLARY: GLUCOSE-CAPILLARY: 242 mg/dL — AB (ref 70–99)

## 2018-08-12 MED ORDER — KETOROLAC TROMETHAMINE 30 MG/ML IJ SOLN
15.0000 mg | Freq: Once | INTRAMUSCULAR | Status: DC
  Filled 2018-08-12: qty 1

## 2018-08-12 MED ORDER — PROMETHAZINE HCL 25 MG/ML IJ SOLN
12.5000 mg | Freq: Once | INTRAMUSCULAR | Status: AC
  Administered 2018-08-12: 12.5 mg via INTRAVENOUS
  Filled 2018-08-12: qty 1

## 2018-08-12 MED ORDER — SODIUM CHLORIDE 0.9 % IV BOLUS
1000.0000 mL | Freq: Once | INTRAVENOUS | Status: AC
  Administered 2018-08-12: 1000 mL via INTRAVENOUS

## 2018-08-12 MED ORDER — FENTANYL CITRATE (PF) 100 MCG/2ML IJ SOLN
50.0000 ug | Freq: Once | INTRAMUSCULAR | Status: AC
  Administered 2018-08-12: 50 ug via INTRAVENOUS
  Filled 2018-08-12: qty 2

## 2018-08-12 MED ORDER — LORAZEPAM 2 MG/ML IJ SOLN
1.0000 mg | Freq: Once | INTRAMUSCULAR | Status: AC
  Administered 2018-08-12: 1 mg via INTRAVENOUS
  Filled 2018-08-12: qty 1

## 2018-08-12 NOTE — ED Triage Notes (Signed)
Onset one hour ago, sudden chest pain, sob, dizziness, nausea.
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
## 2018-09-03 ENCOUNTER — Other Ambulatory Visit: Payer: Self-pay | Admitting: Family Medicine

## 2018-09-03 MED ORDER — MOMETASONE FUROATE 0.1 % EX OINT: TOPICAL_OINTMENT | Freq: Every day | CUTANEOUS | 0 refills | Status: DC

## 2018-09-11 ENCOUNTER — Ambulatory Visit (INDEPENDENT_AMBULATORY_CARE_PROVIDER_SITE_OTHER): Payer: Medicaid Other | Admitting: Psychiatry

## 2018-09-11 ENCOUNTER — Ambulatory Visit: Payer: Self-pay | Admitting: Psychiatry

## 2018-09-11 DIAGNOSIS — F3162 Bipolar disorder, current episode mixed, moderate: Secondary | ICD-10-CM
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
## 2018-09-23 DIAGNOSIS — E1040 Type 1 diabetes mellitus with diabetic neuropathy, unspecified: Secondary | ICD-10-CM | POA: Diagnosis not present

## 2018-09-23 DIAGNOSIS — E1065 Type 1 diabetes mellitus with hyperglycemia: Secondary | ICD-10-CM | POA: Diagnosis not present

## 2018-09-27 ENCOUNTER — Telehealth: Payer: Self-pay | Admitting: Gastroenterology

## 2018-09-27 NOTE — Telephone Encounter (Signed)
Pt requests Domperidone pscrp sent to [place].

## 2018-09-27 NOTE — Telephone Encounter (Signed)
Rx that has been on hold since October. Pt never returned call to proceed as requested. Faxed to the online [place] pharmacy along with her snap shot sheet. She states that [facility] had called her to schedule an appointment, she however declined, as she thought the only reason she was going there was because she couldn't afford the Domperidone. Now she can get the medication from [place], She didn't think she needed to go to [facility]. Please advise.

## 2018-09-27 NOTE — Telephone Encounter (Signed)
Pt no longer needs referral. See note 09-27-2018.

## 2018-09-27 NOTE — Telephone Encounter (Signed)
If she is able to get the domperidone from [place], then I no longer feel the need for [facility] GI evaluation now.  If domperidone does not help the way we hope, then she may still need to pursue the [place] consult.

## 2018-09-27 NOTE — Telephone Encounter (Signed)
Left a detailed message on patient voicemail 

## 2018-10-23 ENCOUNTER — Other Ambulatory Visit: Payer: Self-pay | Admitting: Psychiatry

## 2018-10-23 DIAGNOSIS — E1065 Type 1 diabetes mellitus with hyperglycemia: Secondary | ICD-10-CM | POA: Diagnosis not present

## 2018-10-23 DIAGNOSIS — E1040 Type 1 diabetes mellitus with diabetic neuropathy, unspecified: Secondary | ICD-10-CM | POA: Diagnosis not present

## 2018-10-23 NOTE — Telephone Encounter (Signed)
Need to review paper chart  

## 2018-10-25 ENCOUNTER — Other Ambulatory Visit: Payer: Self-pay | Admitting: Family Medicine

## 2018-10-25 MED ORDER — VALACYCLOVIR HCL 1 G PO TABS: ORAL_TABLET | ORAL | 3 refills | Status: AC

## 2018-10-25 MED ORDER — MONTELUKAST SODIUM 10 MG PO TABS: 10.0000 mg | ORAL_TABLET | Freq: Every day | ORAL | 3 refills | Status: DC

## 2018-10-29 ENCOUNTER — Telehealth: Payer: Self-pay

## 2018-10-29 NOTE — Telephone Encounter (Signed)
Received refill request from [facility] for Equetro 200mg  3/day. Spoke with pt and confirmed dosage, she also mentioned the rx had to be written under supervising physician of provider for Medicaid to pay for medication.   Pharmacy contacted as well to confirm submitting under Dr. Casimiro Needle name.

## 2018-10-30 ENCOUNTER — Emergency Department (HOSPITAL_COMMUNITY): Payer: No Typology Code available for payment source

## 2018-10-30 ENCOUNTER — Emergency Department (HOSPITAL_COMMUNITY)
Admission: EM | Admit: 2018-10-30 | Discharge: 2018-10-30 | Disposition: A | Discharge status: Home / Self Care | Payer: No Typology Code available for payment source | Attending: Emergency Medicine | Admitting: Emergency Medicine

## 2018-10-30 ENCOUNTER — Other Ambulatory Visit: Payer: Self-pay

## 2018-10-30 ENCOUNTER — Encounter (HOSPITAL_COMMUNITY): Payer: Self-pay

## 2018-10-30 DIAGNOSIS — I10 Essential (primary) hypertension: Secondary | ICD-10-CM | POA: Diagnosis not present

## 2018-10-30 DIAGNOSIS — M7918 Myalgia, other site: Secondary | ICD-10-CM | POA: Diagnosis not present

## 2018-10-30 DIAGNOSIS — Z794 Long term (current) use of insulin: Secondary | ICD-10-CM | POA: Insufficient documentation

## 2018-10-30 DIAGNOSIS — Y92410 Unspecified street and highway as the place of occurrence of the external cause: Secondary | ICD-10-CM | POA: Insufficient documentation

## 2018-10-30 DIAGNOSIS — S299XXA Unspecified injury of thorax, initial encounter: Secondary | ICD-10-CM | POA: Diagnosis not present

## 2018-10-30 DIAGNOSIS — S0990XA Unspecified injury of head, initial encounter: Secondary | ICD-10-CM | POA: Diagnosis not present

## 2018-10-30 DIAGNOSIS — K3184 Gastroparesis: Secondary | ICD-10-CM | POA: Insufficient documentation

## 2018-10-30 DIAGNOSIS — R51 Headache: Secondary | ICD-10-CM | POA: Diagnosis not present

## 2018-10-30 DIAGNOSIS — J45909 Unspecified asthma, uncomplicated: Secondary | ICD-10-CM | POA: Insufficient documentation

## 2018-10-30 DIAGNOSIS — M545 Low back pain: Secondary | ICD-10-CM | POA: Diagnosis not present

## 2018-10-30 DIAGNOSIS — S199XXA Unspecified injury of neck, initial encounter: Secondary | ICD-10-CM | POA: Diagnosis not present

## 2018-10-30 DIAGNOSIS — M542 Cervicalgia: Secondary | ICD-10-CM | POA: Diagnosis not present

## 2018-10-30 DIAGNOSIS — E1043 Type 1 diabetes mellitus with diabetic autonomic (poly)neuropathy: Secondary | ICD-10-CM | POA: Diagnosis not present

## 2018-10-30 DIAGNOSIS — E103399 Type 1 diabetes mellitus with moderate nonproliferative diabetic retinopathy without macular edema, unspecified eye: Secondary | ICD-10-CM | POA: Insufficient documentation

## 2018-10-30 DIAGNOSIS — R000 Tachycardia, unspecified: Secondary | ICD-10-CM | POA: Insufficient documentation

## 2018-10-30 DIAGNOSIS — R52 Pain, unspecified: Secondary | ICD-10-CM | POA: Diagnosis not present

## 2018-10-30 DIAGNOSIS — Z79899 Other long term (current) drug therapy: Secondary | ICD-10-CM | POA: Diagnosis not present

## 2018-10-30 DIAGNOSIS — S3991XA Unspecified injury of abdomen, initial encounter: Secondary | ICD-10-CM | POA: Diagnosis not present

## 2018-10-30 LAB — CBC WITH DIFFERENTIAL/PLATELET
Abs Immature Granulocytes: 0.02 10*3/uL (ref 0.00–0.07)
Basophils Absolute: 0.1 10*3/uL (ref 0.0–0.1)
Basophils Relative: 1 %
Eosinophils Absolute: 0.3 10*3/uL (ref 0.0–0.5)
Eosinophils Relative: 4 %
HCT: 37.4 % (ref 36.0–46.0)
Hemoglobin: 11.9 g/dL — ABNORMAL LOW (ref 12.0–15.0)
Immature Granulocytes: 0 %
Lymphocytes Relative: 22 %
Lymphs Abs: 1.5 10*3/uL (ref 0.7–4.0)
MCH: 29.4 pg (ref 26.0–34.0)
MCHC: 31.8 g/dL (ref 30.0–36.0)
MCV: 92.3 fL (ref 80.0–100.0)
Monocytes Absolute: 0.4 10*3/uL (ref 0.1–1.0)
Monocytes Relative: 6 %
Neutro Abs: 4.5 10*3/uL (ref 1.7–7.7)
Neutrophils Relative %: 67 %
Platelets: 276 10*3/uL (ref 150–400)
RBC: 4.05 MIL/uL (ref 3.87–5.11)
RDW: 12.3 % (ref 11.5–15.5)
WBC: 6.8 10*3/uL (ref 4.0–10.5)
nRBC: 0 % (ref 0.0–0.2)

## 2018-10-30 LAB — BASIC METABOLIC PANEL
Anion gap: 6 (ref 5–15)
BUN: 23 mg/dL — ABNORMAL HIGH (ref 6–20)
CO2: 24 mmol/L (ref 22–32)
Calcium: 8.7 mg/dL — ABNORMAL LOW (ref 8.9–10.3)
Chloride: 105 mmol/L (ref 98–111)
Creatinine, Ser: 1.33 mg/dL — ABNORMAL HIGH (ref 0.44–1.00)
GFR calc Af Amer: >60 mL/min (ref >60)
GFR calc non Af Amer: 52 mL/min — ABNORMAL LOW (ref >60)
Glucose, Bld: 284 mg/dL — ABNORMAL HIGH (ref 70–99)
Potassium: 4.9 mmol/L (ref 3.5–5.1)
Sodium: 135 mmol/L (ref 135–145)

## 2018-10-30 LAB — I-STAT BETA HCG BLOOD, ED (MC, WL, AP ONLY): I-stat hCG, quantitative: <5 m[IU]/mL (ref <5)

## 2018-10-30 MED ORDER — HYDROMORPHONE HCL 1 MG/ML IJ SOLN
0.5000 mg | Freq: Once | INTRAMUSCULAR | Status: AC
  Administered 2018-10-30: 0.5 mg via INTRAVENOUS
  Filled 2018-10-30: qty 1

## 2018-10-30 MED ORDER — ONDANSETRON HCL 4 MG/2ML IJ SOLN
4.0000 mg | Freq: Once | INTRAMUSCULAR | Status: AC
  Administered 2018-10-30: 4 mg via INTRAVENOUS
  Filled 2018-10-30: qty 2

## 2018-10-30 MED ORDER — IOPAMIDOL (ISOVUE-300) INJECTION 61%
INTRAVENOUS | Status: AC
  Filled 2018-10-30: qty 100

## 2018-10-30 MED ORDER — SODIUM CHLORIDE 0.9 % IV BOLUS
1000.0000 mL | Freq: Once | INTRAVENOUS | Status: AC
  Administered 2018-10-30: 1000 mL via INTRAVENOUS

## 2018-10-30 MED ORDER — PROMETHAZINE HCL 25 MG/ML IJ SOLN
12.5000 mg | Freq: Once | INTRAMUSCULAR | Status: AC
  Administered 2018-10-30: 12.5 mg via INTRAVENOUS
  Filled 2018-10-30: qty 1

## 2018-10-30 MED ORDER — MORPHINE SULFATE (PF) 4 MG/ML IV SOLN
6.0000 mg | Freq: Once | INTRAVENOUS | Status: AC
  Administered 2018-10-30: 6 mg via INTRAVENOUS
  Filled 2018-10-30: qty 2

## 2018-10-30 MED ORDER — ONDANSETRON HCL 8 MG PO TABS: 8.0000 mg | ORAL_TABLET | Freq: Three times a day (TID) | ORAL | 0 refills | Status: DC | PRN

## 2018-10-30 MED ORDER — SODIUM CHLORIDE (PF) 0.9 % IJ SOLN
INTRAMUSCULAR | Status: AC
  Filled 2018-10-30: qty 50

## 2018-10-30 MED ORDER — HYDROCODONE-ACETAMINOPHEN 5-325 MG PO TABS: 1.0000 | ORAL_TABLET | ORAL | 0 refills | Status: DC | PRN

## 2018-10-30 MED ORDER — IOPAMIDOL (ISOVUE-300) INJECTION 61%
100.0000 mL | Freq: Once | INTRAVENOUS | Status: AC | PRN
  Administered 2018-10-30: 80 mL via INTRAVENOUS
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
## 2018-10-30 NOTE — ED Notes (Signed)
Pt had voiced concerned r/t "feeling that she could not pee." purewick in place,  pt peed large amount .
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
## 2018-10-30 NOTE — ED Notes (Signed)
Provider at bedside

## 2018-10-30 NOTE — ED Notes (Signed)
Morey Hummingbird, RN and Di Kindle, RN assisted patient with bedpan.

## 2018-10-30 NOTE — ED Notes (Signed)
Pt vomited x 1.  Requesting phenergan and pain medication.  MD made aware.

## 2018-11-04 ENCOUNTER — Encounter: Payer: Self-pay | Admitting: Podiatry

## 2018-11-04 ENCOUNTER — Ambulatory Visit: Payer: Medicaid Other | Admitting: Podiatry

## 2018-11-04 DIAGNOSIS — Z961 Presence of intraocular lens: Secondary | ICD-10-CM | POA: Insufficient documentation

## 2018-11-04 DIAGNOSIS — M79674 Pain in right toe(s): Secondary | ICD-10-CM

## 2018-11-04 DIAGNOSIS — M79675 Pain in left toe(s): Secondary | ICD-10-CM

## 2018-11-04 DIAGNOSIS — E1142 Type 2 diabetes mellitus with diabetic polyneuropathy: Secondary | ICD-10-CM

## 2018-11-04 DIAGNOSIS — L84 Corns and callosities: Secondary | ICD-10-CM | POA: Diagnosis not present

## 2018-11-04 DIAGNOSIS — B351 Tinea unguium: Secondary | ICD-10-CM

## 2018-11-04 MED ORDER — CICLOPIROX 8 % EX SOLN: CUTANEOUS | 11 refills | Status: DC

## 2018-11-04 NOTE — Patient Instructions (Signed)
Diabetes Mellitus and Foot Care Foot care is an important part of your health, especially when you have diabetes. Diabetes may cause you to have problems because of poor blood flow (circulation) to your feet and legs, which can cause your skin to:  Become thinner and drier.  Break more easily.  Heal more slowly.  Peel and crack. You may also have nerve damage (neuropathy) in your legs and feet, causing decreased feeling in them. This means that you may not notice minor injuries to your feet that could lead to more serious problems. Noticing and addressing any potential problems early is the best way to prevent future foot problems. How to care for your feet Foot hygiene  Wash your feet daily with warm water and mild soap. Do not use hot water. Then, pat your feet and the areas between your toes until they are completely dry. Do not soak your feet as this can dry your skin.  Trim your toenails straight across. Do not dig under them or around the cuticle. File the edges of your nails with an emery board or nail file.  Apply a moisturizing lotion or petroleum jelly to the skin on your feet and to dry, brittle toenails. Use lotion that does not contain alcohol and is unscented. Do not apply lotion between your toes. Shoes and socks  Wear clean socks or stockings every day. Make sure they are not too tight. Do not wear knee-high stockings since they may decrease blood flow to your legs.  Wear shoes that fit properly and have enough cushioning. Always look in your shoes before you put them on to be sure there are no objects inside.  To break in new shoes, wear them for just a few hours a day. This prevents injuries on your feet. Wounds, scrapes, corns, and calluses  Check your feet daily for blisters, cuts, bruises, sores, and redness. If you cannot see the bottom of your feet, use a mirror or ask someone for help.  Do not cut corns or calluses or try to remove them with medicine.  If you  find a minor scrape, cut, or break in the skin on your feet, keep it and the skin around it clean and dry. You may clean these areas with mild soap and water. Do not clean the area with peroxide, alcohol, or iodine.  If you have a wound, scrape, corn, or callus on your foot, look at it several times a day to make sure it is healing and not infected. Check for: ? Redness, swelling, or pain. ? Fluid or blood. ? Warmth. ? Pus or a bad smell. General instructions  Do not cross your legs. This may decrease blood flow to your feet.  Do not use heating pads or hot water bottles on your feet. They may burn your skin. If you have lost feeling in your feet or legs, you may not know this is happening until it is too late.  Protect your feet from hot and cold by wearing shoes, such as at the beach or on hot pavement.  Schedule a complete foot exam at least once a year (annually) or more often if you have foot problems. If you have foot problems, report any cuts, sores, or bruises to your health care provider immediately. Contact a health care provider if:  You have a medical condition that increases your risk of infection and you have any cuts, sores, or bruises on your feet.  You have an injury that is not   healing.  You have redness on your legs or feet.  You feel burning or tingling in your legs or feet.  You have pain or cramps in your legs and feet.  Your legs or feet are numb.  Your feet always feel cold.  You have pain around a toenail. Get help right away if:  You have a wound, scrape, corn, or callus on your foot and: ? You have pain, swelling, or redness that gets worse. ? You have fluid or blood coming from the wound, scrape, corn, or callus. ? Your wound, scrape, corn, or callus feels warm to the touch. ? You have pus or a bad smell coming from the wound, scrape, corn, or callus. ? You have a fever. ? You have a red line going up your leg. Summary  Check your feet every day  for cuts, sores, red spots, swelling, and blisters.  Moisturize feet and legs daily.  Wear shoes that fit properly and have enough cushioning.  If you have foot problems, report any cuts, sores, or bruises to your health care provider immediately.  Schedule a complete foot exam at least once a year (annually) or more often if you have foot problems. This information is not intended to replace advice given to you by your health care provider. Make sure you discuss any questions you have with your health care provider. Document Released: 09/22/2000 Document Revised: 11/07/2017 Document Reviewed: 10/27/2016 Elsevier Interactive Patient Education  2019 [employer]  Corns and Calluses Corns are small areas of thickened skin that occur on the top, sides, or tip of a toe. They contain a cone-shaped core with a point that can press on a nerve below. This causes pain.  Calluses are areas of thickened skin that can occur anywhere on the body, including the hands, fingers, palms, soles of the feet, and heels. Calluses are usually larger than corns. What are the causes? Corns and calluses are caused by rubbing (friction) or pressure, such as from shoes that are too tight or do not fit properly. What increases the risk? Corns are more likely to develop in people who have misshapen toes (toe deformities), such as hammer toes. Calluses can occur with friction to any area of the skin. They are more likely to develop in people who:  Work with their hands.  Wear shoes that fit poorly, are too tight, or are high-heeled.  Have toe deformities. What are the signs or symptoms? Symptoms of a corn or callus include:  A hard growth on the skin.  Pain or tenderness under the skin.  Redness and swelling.  Increased discomfort while wearing tight-fitting shoes, if your feet are affected. If a corn or callus becomes infected, symptoms may include:  Redness and swelling that gets  worse.  Pain.  Fluid, blood, or pus draining from the corn or callus. How is this diagnosed? Corns and calluses may be diagnosed based on your symptoms, your medical history, and a physical exam. How is this treated? Treatment for corns and calluses may include:  Removing the cause of the friction or pressure. This may involve: ? Changing your shoes. ? Wearing shoe inserts (orthotics) or other protective layers in your shoes, such as a corn pad. ? Wearing gloves.  Applying medicine to the skin (topical medicine) to help soften skin in the hardened, thickened areas.  Removing layers of dead skin with a file to reduce the size of the corn or callus.  Removing the corn or callus with a scalpel   or laser.  Taking antibiotic medicines, if your corn or callus is infected.  Having surgery, if a toe deformity is the cause. Follow these instructions at home:   Take over-the-counter and prescription medicines only as told by your health care provider.  If you were prescribed an antibiotic, take it as told by your health care provider. Do not stop taking it even if your condition starts to improve.  Wear shoes that fit well. Avoid wearing high-heeled shoes and shoes that are too tight or too loose.  Wear any padding, protective layers, gloves, or orthotics as told by your health care provider.  Soak your hands or feet and then use a file or pumice stone to soften your corn or callus. Do this as told by your health care provider.  Check your corn or callus every day for symptoms of infection. Contact a health care provider if you:  Notice that your symptoms do not improve with treatment.  Have redness or swelling that gets worse.  Notice that your corn or callus becomes painful.  Have fluid, blood, or pus coming from your corn or callus.  Have new symptoms. Summary  Corns are small areas of thickened skin that occur on the top, sides, or tip of a toe.  Calluses are areas of  thickened skin that can occur anywhere on the body, including the hands, fingers, palms, and soles of the feet. Calluses are usually larger than corns.  Corns and calluses are caused by rubbing (friction) or pressure, such as from shoes that are too tight or do not fit properly.  Treatment may include wearing any padding, protective layers, gloves, or orthotics as told by your health care provider. This information is not intended to replace advice given to you by your health care provider. Make sure you discuss any questions you have with your health care provider. Document Released: 07/01/2004 Document Revised: 08/08/2017 Document Reviewed: 08/08/2017 Elsevier Interactive Patient Education  2019 [employer] Inc.  
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
## 2018-11-05 ENCOUNTER — Encounter: Payer: Self-pay | Admitting: Family Medicine

## 2018-11-05 ENCOUNTER — Ambulatory Visit: Payer: Medicaid Other | Admitting: Family Medicine

## 2018-11-05 VITALS — BP 124/72 | HR 88 | Temp 98.2°F | Resp 16 | Ht 66.0 in | Wt 195.0 lb

## 2018-11-05 DIAGNOSIS — F0781 Postconcussional syndrome: Secondary | ICD-10-CM | POA: Diagnosis not present

## 2018-11-05 DIAGNOSIS — S134XXA Sprain of ligaments of cervical spine, initial encounter: Secondary | ICD-10-CM | POA: Diagnosis not present

## 2018-11-05 MED ORDER — CYCLOBENZAPRINE HCL 10 MG PO TABS: 10.0000 mg | ORAL_TABLET | Freq: Three times a day (TID) | ORAL | 0 refills | Status: DC | PRN

## 2018-11-05 MED ORDER — BUTALBITAL-APAP-CAFFEINE 50-325-40 MG PO TABS: 1.0000 | ORAL_TABLET | Freq: Four times a day (QID) | ORAL | 0 refills | Status: DC | PRN
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
## 2018-11-12 ENCOUNTER — Ambulatory Visit: Payer: Medicaid Other | Admitting: Family Medicine

## 2018-11-12 ENCOUNTER — Encounter: Payer: Self-pay | Admitting: Family Medicine

## 2018-11-12 VITALS — BP 130/84 | HR 90 | Temp 98.2°F | Resp 15 | Ht 66.0 in | Wt 191.2 lb

## 2018-11-12 DIAGNOSIS — J45901 Unspecified asthma with (acute) exacerbation: Secondary | ICD-10-CM

## 2018-11-12 DIAGNOSIS — J329 Chronic sinusitis, unspecified: Secondary | ICD-10-CM | POA: Diagnosis not present

## 2018-11-12 DIAGNOSIS — J310 Chronic rhinitis: Secondary | ICD-10-CM

## 2018-11-12 MED ORDER — PREDNISONE 20 MG PO TABS: 40.0000 mg | ORAL_TABLET | Freq: Every day | ORAL | 0 refills | Status: AC

## 2018-11-12 MED ORDER — FLUTICASONE PROPIONATE 50 MCG/ACT NA SUSP: 2.0000 | Freq: Every day | NASAL | 2 refills | Status: DC

## 2018-11-12 MED ORDER — ALBUTEROL SULFATE (2.5 MG/3ML) 0.083% IN NEBU: 2.5000 mg | INHALATION_SOLUTION | Freq: Four times a day (QID) | RESPIRATORY_TRACT | 1 refills | Status: DC | PRN

## 2018-11-12 MED ORDER — ALBUTEROL SULFATE HFA 108 (90 BASE) MCG/ACT IN AERS: INHALATION_SPRAY | RESPIRATORY_TRACT | 3 refills | Status: DC

## 2018-11-12 MED ORDER — MOMETASONE FURO-FORMOTEROL FUM 100-5 MCG/ACT IN AERO: 2.0000 | INHALATION_SPRAY | Freq: Two times a day (BID) | RESPIRATORY_TRACT | 5 refills | Status: DC

## 2018-11-12 NOTE — Patient Instructions (Signed)
Call me in 2-3 days if not getting better  Do flonase, sudafed and mucinex.  Call right away if new fever and we will get you antibiotics as well.  Right now I think its a virus and you have a little time to wait and support your body.

## 2018-11-12 NOTE — Progress Notes (Signed)
Patient ID: [name], female    DOB: 1986/05/13, 33 y.o.   MRN: [id number]  PCP: [name], MD  Chief Complaint  Patient presents with  . Cough    Patient in today with c/o cough, and congestion. Onset Saturday    Subjective:   [name] is a 33 y.o. female, presents to clinic with CC of cough and congestion, nasal sx, sinus congestion and nasal drainage. Onset Saturday night (3 days ago) breathing became worse yesterday.  Cough is dry, associated with wheeze and SOB, she is using inhaler more, several times a day and at night.  Hx of asthma, this does feel like asthma exacerbation.  No CP, fever, chills sweats, HA, facial pain, sore throat, N, V, D.  No sick contacts.        Patient Active Problem List   Diagnosis Date Noted  . Pseudophakia of left eye 11/04/2018  . Constipation   . CKD (chronic kidney disease), stage III ([place]) 01/01/2017  . Diabetic nephropathy associated with type 1 diabetes mellitus ([place]) 03/01/2016  . Bipolar disorder in partial remission ([place]) 02/01/2016  . Chronic hypertension 02/01/2016  . DM retinopathy ([place])   . Encounter for preconception consultation   . After-cataract obscuring vision, left 09/22/2014  . MDD (major depressive disorder), severe ([place]) 05/31/2014  . Drug overdose, intentional ([place]) 03/27/2014  . Poisoning by drug or medicinal substance 03/27/2014  . Asthma 11/10/2013  . Colles' fracture of left radius 10/11/2013  . Type I diabetes mellitus with complication, uncontrolled ([place]) 10/11/2013  . MDD (major depressive disorder), recurrent episode, severe ([place]) 09/26/2013  . Generalized anxiety disorder 09/26/2013  . Aphakia, right eye 09/14/2012  . Retinal detachment, tractional, left eye 06/11/2012  . Anemia 06/07/2012  . Eczema 06/07/2012  . Renal calculi 06/07/2012  . Hyperglycemia 04/30/2012  . Diabetic retinopathy associated with type 1 diabetes mellitus ([place]) 04/30/2012  . Polyneuropathy in  diabetes(357.2) 04/30/2012  . H/O insertion of insulin pump 04/30/2012  . Polyneuropathy in diabetes ([place]) 04/30/2012  . CHOLELITHIASIS 08/10/2010  . ATRIAL TACHYCARDIA 08/08/2010  . Gastroparesis 08/08/2010  . LIVER FUNCTION TESTS, ABNORMAL, HX OF 08/08/2010  . Type 1 diabetes mellitus with ketoacidosis, uncontrolled ([place]) 08/02/2010  . BOWEL INTUSSUSCEPTION 08/02/2010  . PANCREATITIS, ACUTE, HX OF 08/02/2010  . ABDOMINAL PAIN, LEFT UPPER QUADRANT 08/01/2010     Prior to Admission medications   Medication Sig Start Date End Date Taking? Authorizing Provider  acetaminophen (TYLENOL) 500 MG tablet Take 500 mg by mouth daily as needed for mild pain.   Yes [provider]  albuterol (PROAIR HFA) 108 (90 Base) MCG/ACT inhaler INHALE 2 PUFFS BY MOUTH EVERY 6 HOURS AS NEEDED FOR SHORTNESS OF BREATH 12/27/17  Yes [name], MD  busPIRone (BUSPAR) 15 MG tablet Take 15 mg by mouth 2 (two) times daily. 10/01/18  Yes [provider]  butalbital-acetaminophen-caffeine (FIORICET, ESGIC) 50-325-40 MG tablet Take 1-2 tablets by mouth every 6 (six) hours as needed for headache. 11/05/18 11/05/19 Yes [name], MD  ciclopirox [facility]) 8 % solution Apply one coat to each toenail daily. Remove weekly with polish remover. 11/04/18  Yes Marzetta Board, DPM  cyclobenzaprine (FLEXERIL) 10 MG tablet Take 1 tablet (10 mg total) by mouth 3 (three) times daily as needed for muscle spasms. 11/05/18  Yes [name], MD  enalapril (VASOTEC) 10 MG tablet Take 10 mg by mouth daily.   Yes [provider]  EQUETRO 200 MG  CP12 12 hr capsule TAKE 2 CAPSULES EVERY MORNING AND TAKE 1 CAPSULE AT BEDTIME Patient taking differently: Take 200-400 mg by mouth 2 (two) times daily.  10/29/18  Yes Shugart, Lissa Hoard, PA-C  etonogestrel (NEXPLANON) 68 MG IMPL implant 1 each by Subdermal route once.   Yes [provider]  furosemide (LASIX) 40 MG tablet Take 40 mg by mouth. Every other  day   Yes [provider]  gabapentin (NEURONTIN) 300 MG capsule Take 1 capsule (300 mg total) by mouth 3 (three) times daily. 05/16/18  Yes [name], MD  Insulin Human (INSULIN PUMP) SOLN Inject 1 each into the skin 3 times daily with meals, bedtime and 2 AM. Novolog/36-38 units/day basal; bolus sliding scale/adjusted per sliding scale by patient   Yes [provider]  lamoTRIgine (LAMICTAL) 100 MG tablet Take 100 mg by mouth 2 (two) times daily.   Yes [provider]  lamoTRIgine (LAMICTAL) 200 MG tablet Take 200 mg by mouth 2 (two) times daily.    Yes [provider]  linaclotide Rolan Lipa) 145 MCG CAPS capsule Take 1 capsule (145 mcg total) by mouth daily before breakfast. 04/10/18  Yes Danis, Kirke Corin, MD  Lurasidone HCl (LATUDA) 120 MG TABS Take 1 tablet by mouth daily.   Yes [provider]  mometasone (ELOCON) 0.1 % ointment Apply topically daily. 09/03/18  Yes [name], MD  mometasone-formoterol (DULERA) 100-5 MCG/ACT AERO Inhale 2 puffs into the lungs 2 (two) times daily. 03/15/18  Yes Delsa Grana, PA-C  montelukast (SINGULAIR) 10 MG tablet Take 1 tablet (10 mg total) by mouth at bedtime. 10/25/18  Yes [name], MD  NOVOLOG 100 UNIT/ML injection Inject 36-150 Units into the skin. Via insulin pump per sliding scale 10/04/18  Yes [provider]  ondansetron (ZOFRAN) 8 MG tablet Take 1 tablet (8 mg total) by mouth every 8 (eight) hours as needed for nausea or vomiting. 10/30/18  Yes Daleen Bo, MD  promethazine (PHENERGAN) 25 MG tablet Take 0.5 tablets (12.5 mg total) by mouth daily as needed for nausea or vomiting. 05/23/18  Yes Danis, Kirke Corin, MD  traZODone (DESYREL) 50 MG tablet Take 50 mg by mouth at bedtime.  11/30/16  Yes [provider]  valACYclovir (VALTREX) 1000 MG tablet 2 tabs po bid x 1 prn breakout 10/25/18 03/29/19 Yes Pickard, Cammie Mcgee, MD  albuterol (PROVENTIL) (2.5 MG/3ML) 0.083%  nebulizer solution Take 3 mLs (2.5 mg total) by nebulization every 4 (four) hours as needed for wheezing or shortness of breath. Patient not taking: Reported on 10/30/2018 11/10/16 06/03/18  [name], MD  prochlorperazine (COMPAZINE) 25 MG suppository Place 1 suppository (25 mg total) rectally daily as needed for nausea or vomiting. Patient not taking: Reported on 11/12/2018 03/07/18   Doran Stabler, MD     Allergies  Allergen Reactions  . Toradol [Ketorolac Tromethamine]     Cannot take d/t kidney issues   . Ceftin Rash     Family History  Problem Relation Age of Onset  . Hypertension Other   . Diabetes Mother        type 2  . Diabetes Brother        type 1  . Renal Disease Brother        renal failure  . Hypertension Brother   . Colon cancer Neg Hx   . Rectal cancer Neg Hx   . Esophageal cancer Neg Hx      Social  History   Socioeconomic History  . Marital status: Legally Separated    Spouse name: Not on file  . Number of children: 2  . Years of education: Not on file  . Highest education level: Not on file  Occupational History  . Occupation: Materials engineer  Social Needs  . Financial resource strain: Not on file  . Food insecurity:    Worry: Not on file    Inability: Not on file  . Transportation needs:    Medical: Not on file    Non-medical: Not on file  Tobacco Use  . Smoking status: Never Smoker  . Smokeless tobacco: Never Used  Substance and Sexual Activity  . Alcohol use: No  . Drug use: No  . Sexual activity: Yes    Partners: Male  Lifestyle  . Physical activity:    Days per week: Not on file    Minutes per session: Not on file  . Stress: Not on file  Relationships  . Social connections:    Talks on phone: Not on file    Gets together: Not on file    Attends religious service: Not on file    Active member of club or organization: Not on file    Attends meetings of clubs or organizations: Not on file    Relationship status: Not on file    . Intimate partner violence:    Fear of current or ex partner: Not on file    Emotionally abused: Not on file    Physically abused: Not on file    Forced sexual activity: Not on file  Other Topics Concern  . Not on file  Social History Narrative   Pt has a daughter and boyfriend.            Review of Systems  Constitutional: Negative.   HENT: Negative.   Eyes: Negative.   Respiratory: Negative.   Cardiovascular: Negative.   Gastrointestinal: Negative.   Endocrine: Negative.   Genitourinary: Negative.   Musculoskeletal: Negative.   Skin: Negative.   Allergic/Immunologic: Negative.   Neurological: Negative.   Hematological: Negative.   Psychiatric/Behavioral: Negative.   All other systems reviewed and are negative.      Objective:    Vitals:   11/12/18 1159  BP: 130/84  Pulse: 90  Resp: 15  Temp: 98.2 F (36.8 C)  TempSrc: Oral  SpO2: 97%  Weight: 191 lb 4 oz (86.8 kg)  Height: 5\' 6"  (1.676 m)      Physical Exam Vitals signs and nursing note reviewed.  Constitutional:      General: She is not in acute distress.    Appearance: She is well-developed. She is obese. She is not ill-appearing, toxic-appearing or diaphoretic.  HENT:     Head: Normocephalic and atraumatic.     Right Ear: Hearing, tympanic membrane, ear canal and external ear normal.     Left Ear: Hearing, tympanic membrane, ear canal and external ear normal.     Nose: Mucosal edema, congestion and rhinorrhea present.     Right Turbinates: Enlarged.     Left Turbinates: Enlarged.     Right Sinus: No maxillary sinus tenderness or frontal sinus tenderness.     Left Sinus: No maxillary sinus tenderness or frontal sinus tenderness.     Mouth/Throat:     Lips: Pink.     Mouth: Mucous membranes are moist. Mucous membranes are not pale.     Pharynx: Oropharynx is clear. Uvula midline. No oropharyngeal exudate, posterior oropharyngeal erythema  or uvula swelling.     Tonsils: No tonsillar exudate or  tonsillar abscesses.  Eyes:     General:        Right eye: No discharge.        Left eye: No discharge.     Conjunctiva/sclera: Conjunctivae normal.     Pupils: Pupils are equal, round, and reactive to light.  Neck:     Musculoskeletal: Normal range of motion and neck supple.     Trachea: No tracheal deviation.  Cardiovascular:     Rate and Rhythm: Normal rate and regular rhythm.     Pulses: Normal pulses.     Heart sounds: Normal heart sounds.  Pulmonary:     Effort: Pulmonary effort is normal. No tachypnea, accessory muscle usage, respiratory distress or retractions.     Breath sounds: No stridor, decreased air movement or transmitted upper airway sounds. Wheezing present. No decreased breath sounds, rhonchi or rales.  Chest:     Chest wall: No tenderness.  Abdominal:     General: Bowel sounds are normal. There is no distension.     Palpations: Abdomen is soft.  Musculoskeletal: Normal range of motion.  Skin:    General: Skin is warm and dry.     Coloration: Skin is not pale.     Findings: No rash.  Neurological:     Mental Status: She is alert.     Motor: No abnormal muscle tone.     Coordination: Coordination normal.  Psychiatric:        Behavior: Behavior normal.           Assessment & Plan:   URI sx and asthma exacerbation.  No sign of bacterial infection like ABS or CAP, supportive tx for URI and increase allergy control for signs of allergic rhinitis.  Currently no increased WOB.  Pt needs refills of most asthma medications. Steroid burst and use inhaler.  Pt encouraged to return if not improving.     ICD-10-CM   1. Rhinosinusitis J32.9 fluticasone (FLONASE) 50 MCG/ACT nasal spray  2. Exacerbation of persistent asthma, unspecified asthma severity J45.901 predniSONE (DELTASONE) 20 MG tablet    albuterol (PROAIR HFA) 108 (90 Base) MCG/ACT inhaler    albuterol (PROVENTIL) (2.5 MG/3ML) 0.083% nebulizer solution    mometasone-formoterol (DULERA) 100-5 MCG/ACT  AERO       Delsa Grana, PA-C 11/12/18 12:06 PM

## 2018-11-15 ENCOUNTER — Encounter: Payer: Self-pay | Admitting: Family Medicine

## 2018-11-15 ENCOUNTER — Telehealth: Payer: Self-pay | Admitting: Family Medicine

## 2018-11-15 ENCOUNTER — Ambulatory Visit: Payer: Medicaid Other | Admitting: Psychiatry

## 2018-11-15 MED ORDER — AZITHROMYCIN 250 MG PO TABS: ORAL_TABLET | ORAL | 0 refills | Status: DC

## 2018-11-15 NOTE — Telephone Encounter (Signed)
I called pt personally tonight, she says that the dose of steroids was really high much higher than she is had before she is taking 4 pills however these are 10 mg tablets and she wants to decrease them but she also is having worsening chest congestion and wheeze and she is only using rescue inhaler once a day.  I have explained to her that steroid dose is not extremely high and it is not a long duration of medications she should not decrease the steroids if she is still wheezing however I believe she is under treating her wheeze and she should use her rescue inhaler and nebulizers much more frequently.  She does have a insulin pump with basal insulin and postprandial insulin as well, she was instructed to titrate up the dose so that her fasting sugars are closer to 200 so we will not set her into DKA or complicate her course of illness.  I did call in a Z-Pak, instructed her to take Mucinex, and she was encouraged to use rescue inhalers more frequently, follow-up in the next couple days if not improving.

## 2018-11-15 NOTE — Telephone Encounter (Signed)
Patient called and stated that she is not feeling in better.She stated cough and congestion have become worse and she is now wheezing. She stated you had mentioned that if not better and antibiotic would be called in. Also she wanted to let you know that her sugars have been running higher than normal on the steroids. States they have been ranging between 270-mid 400's. She want to know can you do a taper dose and lessen amount of prednisone. Please advise?

## 2018-11-21 ENCOUNTER — Ambulatory Visit (INDEPENDENT_AMBULATORY_CARE_PROVIDER_SITE_OTHER): Payer: Medicaid Other | Admitting: Psychiatry

## 2018-11-21 DIAGNOSIS — F411 Generalized anxiety disorder: Secondary | ICD-10-CM

## 2018-11-21 DIAGNOSIS — F3175 Bipolar disorder, in partial remission, most recent episode depressed: Secondary | ICD-10-CM

## 2018-11-21 MED ORDER — LAMOTRIGINE 200 MG PO TABS: 200.0000 mg | ORAL_TABLET | Freq: Two times a day (BID) | ORAL | 2 refills | Status: DC

## 2018-11-21 MED ORDER — TRAZODONE HCL 50 MG PO TABS: 50.0000 mg | ORAL_TABLET | Freq: Every day | ORAL | 2 refills | Status: DC

## 2018-11-21 MED ORDER — BUSPIRONE HCL 15 MG PO TABS: 15.0000 mg | ORAL_TABLET | Freq: Two times a day (BID) | ORAL | 3 refills | Status: DC

## 2018-11-21 MED ORDER — CARBAMAZEPINE ER 200 MG PO CP12: ORAL_CAPSULE | ORAL | 1 refills | Status: DC

## 2018-11-21 MED ORDER — LURASIDONE HCL 120 MG PO TABS: 1.0000 | ORAL_TABLET | Freq: Every day | ORAL | 2 refills | Status: DC

## 2018-11-21 MED ORDER — LAMOTRIGINE 100 MG PO TABS: 100.0000 mg | ORAL_TABLET | Freq: Two times a day (BID) | ORAL | 2 refills | Status: DC

## 2018-11-21 NOTE — Progress Notes (Signed)
[facility]  Patient ID: [name],  MRN: [id number]  PCP: [name], MD  Date of Evaluation: 11/21/2018 Time spent:20 minutes  Chief Complaint:   HISTORY/CURRENT STATUS: HPI patient was last seen 09/11/2018.  Diagnosis of bipolar 1.  She was stable at the time so we continued her medications. Patient has continued doing well.  She does have some anxiety which seems to be triggered by when she has to deal with her ex-husband.  Individual Medical History/ Review of Systems: Changes? :No   Allergies: Toradol [ketorolac tromethamine] and Ceftin  Current Medications:  Current Outpatient Medications:  .  acetaminophen (TYLENOL) 500 MG tablet, Take 500 mg by mouth daily as needed for mild pain., Disp: , Rfl:  .  albuterol (PROAIR HFA) 108 (90 Base) MCG/ACT inhaler, INHALE 2 PUFFS BY MOUTH EVERY 6 HOURS AS NEEDED FOR SHORTNESS OF BREATH, Disp: 18 g, Rfl: 3 .  albuterol (PROVENTIL) (2.5 MG/3ML) 0.083% nebulizer solution, Take 3 mLs (2.5 mg total) by nebulization every 4 (four) hours as needed for wheezing or shortness of breath. (Patient not taking: Reported on 10/30/2018), Disp: 30 vial, Rfl: 1 .  albuterol (PROVENTIL) (2.5 MG/3ML) 0.083% nebulizer solution, Take 3 mLs (2.5 mg total) by nebulization every 6 (six) hours as needed for wheezing or shortness of breath., Disp: 150 mL, Rfl: 1 .  azithromycin (ZITHROMAX) 250 MG tablet, Take 2 tabs (500 mg) PO q d for 1d, then take 1 tab (250 mg) PO q d for day 2-5, Disp: 6 each, Rfl: 0 .  busPIRone (BUSPAR) 15 MG tablet, Take 15 mg by mouth 2 (two) times daily., Disp: , Rfl:  .  butalbital-acetaminophen-caffeine (FIORICET, ESGIC) 50-325-40 MG tablet, Take 1-2 tablets by mouth every 6 (six) hours as needed for headache., Disp: 20 tablet, Rfl: 0 .  ciclopirox (PENLAC) 8 % solution, Apply one coat to each toenail daily. Remove weekly with polish remover., Disp: 6.6 mL, Rfl: 11 .  cyclobenzaprine (FLEXERIL) 10 MG tablet, Take  1 tablet (10 mg total) by mouth 3 (three) times daily as needed for muscle spasms., Disp: 30 tablet, Rfl: 0 .  enalapril (VASOTEC) 10 MG tablet, Take 10 mg by mouth daily., Disp: , Rfl:  .  EQUETRO 200 MG CP12 12 hr capsule, TAKE 2 CAPSULES EVERY MORNING AND TAKE 1 CAPSULE AT BEDTIME (Patient taking differently: Take 200-400 mg by mouth 2 (two) times daily. ), Disp: 90 capsule, Rfl: 1 .  etonogestrel (NEXPLANON) 68 MG IMPL implant, 1 each by Subdermal route once., Disp: , Rfl:  .  fluticasone (FLONASE) 50 MCG/ACT nasal spray, Place 2 sprays into both nostrils daily., Disp: 16 g, Rfl: 2 .  furosemide (LASIX) 40 MG tablet, Take 40 mg by mouth. Every other day, Disp: , Rfl:  .  gabapentin (NEURONTIN) 300 MG capsule, Take 1 capsule (300 mg total) by mouth 3 (three) times daily., Disp: 90 capsule, Rfl: 3 .  Insulin Human (INSULIN PUMP) SOLN, Inject 1 each into the skin 3 times daily with meals, bedtime and 2 AM. Novolog/36-38 units/day basal; bolus sliding scale/adjusted per sliding scale by patient, Disp: , Rfl:  .  lamoTRIgine (LAMICTAL) 100 MG tablet, Take 100 mg by mouth 2 (two) times daily., Disp: , Rfl:  .  lamoTRIgine (LAMICTAL) 200 MG tablet, Take 200 mg by mouth 2 (two) times daily. , Disp: , Rfl:  .  linaclotide (LINZESS) 145 MCG CAPS capsule, Take 1 capsule (145 mcg total) by mouth daily before breakfast., Disp: 30  capsule, Rfl: 3 .  Lurasidone HCl (LATUDA) 120 MG TABS, Take 1 tablet by mouth daily., Disp: , Rfl:  .  mometasone (ELOCON) 0.1 % ointment, Apply topically daily., Disp: 45 g, Rfl: 0 .  mometasone-formoterol (DULERA) 100-5 MCG/ACT AERO, Inhale 2 puffs into the lungs 2 (two) times daily., Disp: 1 Inhaler, Rfl: 5 .  mometasone-formoterol (DULERA) 100-5 MCG/ACT AERO, Inhale 2 puffs into the lungs 2 (two) times daily., Disp: 1 Inhaler, Rfl: 5 .  montelukast (SINGULAIR) 10 MG tablet, Take 1 tablet (10 mg total) by mouth at bedtime., Disp: 90 tablet, Rfl: 3 .  NOVOLOG 100 UNIT/ML  injection, Inject 36-150 Units into the skin. Via insulin pump per sliding scale, Disp: , Rfl:  .  ondansetron (ZOFRAN) 8 MG tablet, Take 1 tablet (8 mg total) by mouth every 8 (eight) hours as needed for nausea or vomiting., Disp: 20 tablet, Rfl: 0 .  prochlorperazine (COMPAZINE) 25 MG suppository, Place 1 suppository (25 mg total) rectally daily as needed for nausea or vomiting. (Patient not taking: Reported on 11/12/2018), Disp: 6 suppository, Rfl: 0 .  promethazine (PHENERGAN) 25 MG tablet, Take 0.5 tablets (12.5 mg total) by mouth daily as needed for nausea or vomiting., Disp: 30 tablet, Rfl: 1 .  traZODone (DESYREL) 50 MG tablet, Take 50 mg by mouth at bedtime. , Disp: , Rfl: 0 .  valACYclovir (VALTREX) 1000 MG tablet, 2 tabs po bid x 1 prn breakout, Disp: 30 tablet, Rfl: 3 Medication Side Effects: none  Family Medical/ Social History: Changes? No  MENTAL HEALTH EXAM:  There were no vitals taken for this visit.There is no height or weight on file to calculate BMI.  General Appearance: Casual  Eye Contact:  Good  Speech:  Normal Rate  Volume:  Normal  Mood:  Euthymic  Affect:  Appropriate  Thought Process:  Linear  Orientation:  Full (Time, Place, and Person)  Thought Content: Logical   Suicidal Thoughts:  No  Homicidal Thoughts:  No  Memory:  WNL  Judgement:  Good  Insight:  Good  Psychomotor Activity:  Normal  Concentration:  Concentration: Good  Recall:  Good  Fund of Knowledge: Good  Language: Good  Assets:  Desire for Improvement  ADL's:  Intact  Cognition: WNL  Prognosis:  Good    DIAGNOSES: No diagnosis found.  Receiving Psychotherapy: No    RECOMMENDATIONS: Patient will continue all her medications except we will increase the BuSpar.  BuSpar now is 15 mg twice daily the new prescription will be 15 mg 2 in the morning and 1 at night for a week then 15 mg 2 in the morning 2 at night.  We will see her again in 3 months   [name], [place]

## 2018-11-25 DIAGNOSIS — E1040 Type 1 diabetes mellitus with diabetic neuropathy, unspecified: Secondary | ICD-10-CM | POA: Diagnosis not present

## 2018-11-25 DIAGNOSIS — E1065 Type 1 diabetes mellitus with hyperglycemia: Secondary | ICD-10-CM | POA: Diagnosis not present

## 2018-12-03 ENCOUNTER — Other Ambulatory Visit: Payer: Self-pay

## 2018-12-03 ENCOUNTER — Telehealth: Payer: Self-pay | Admitting: Psychiatry

## 2018-12-03 MED ORDER — LAMOTRIGINE 100 MG PO TABS: 100.0000 mg | ORAL_TABLET | Freq: Two times a day (BID) | ORAL | 2 refills | Status: DC

## 2018-12-03 MED ORDER — LURASIDONE HCL 120 MG PO TABS: 1.0000 | ORAL_TABLET | Freq: Every day | ORAL | 2 refills | Status: DC

## 2018-12-03 MED ORDER — CARBAMAZEPINE ER 200 MG PO CP12: ORAL_CAPSULE | ORAL | 1 refills | Status: DC

## 2018-12-03 MED ORDER — TRAZODONE HCL 50 MG PO TABS: 50.0000 mg | ORAL_TABLET | Freq: Every day | ORAL | 2 refills | Status: DC

## 2018-12-03 MED ORDER — LAMOTRIGINE 200 MG PO TABS: 200.0000 mg | ORAL_TABLET | Freq: Two times a day (BID) | ORAL | 2 refills | Status: DC

## 2018-12-03 MED ORDER — BUSPIRONE HCL 15 MG PO TABS: 15.0000 mg | ORAL_TABLET | Freq: Two times a day (BID) | ORAL | 3 refills | Status: DC

## 2018-12-03 NOTE — Telephone Encounter (Signed)
All rx's from 11/21/2018 submitted to [facility] per request under Dr. Clovis Pu

## 2018-12-03 NOTE — Telephone Encounter (Signed)
[name] called to report that her written prescriptions were thrown away.  Lamictal,latuda, latuda, buspar.  Will you rewrite them or have them sent in to [facility].  Remember that her insurance requires an MD to prescribe.  Next appt. 5/13

## 2018-12-03 NOTE — Telephone Encounter (Signed)
rx's submitted through [facility] on 2/13? Not sure what written rx's pt talking about

## 2018-12-16 ENCOUNTER — Ambulatory Visit: Payer: Medicaid Other | Admitting: Family Medicine

## 2018-12-16 ENCOUNTER — Encounter: Payer: Self-pay | Admitting: Family Medicine

## 2018-12-16 VITALS — BP 150/90 | HR 90 | Temp 98.4°F | Resp 16 | Ht 66.0 in | Wt 194.0 lb

## 2018-12-16 DIAGNOSIS — G5602 Carpal tunnel syndrome, left upper limb: Secondary | ICD-10-CM

## 2018-12-16 DIAGNOSIS — F0781 Postconcussional syndrome: Secondary | ICD-10-CM | POA: Diagnosis not present

## 2018-12-16 MED ORDER — TIZANIDINE HCL 4 MG PO TABS: 4.0000 mg | ORAL_TABLET | Freq: Four times a day (QID) | ORAL | 0 refills | Status: DC | PRN
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
## 2018-12-18 ENCOUNTER — Ambulatory Visit: Payer: Medicaid Other | Admitting: Podiatry

## 2018-12-23 DIAGNOSIS — E1040 Type 1 diabetes mellitus with diabetic neuropathy, unspecified: Secondary | ICD-10-CM | POA: Diagnosis not present

## 2018-12-23 DIAGNOSIS — E1065 Type 1 diabetes mellitus with hyperglycemia: Secondary | ICD-10-CM | POA: Diagnosis not present

## 2018-12-31 ENCOUNTER — Telehealth: Payer: Self-pay | Admitting: Family Medicine

## 2018-12-31 ENCOUNTER — Ambulatory Visit: Payer: Medicaid Other | Admitting: Podiatry

## 2018-12-31 NOTE — Telephone Encounter (Signed)
Pt calling in about the medication that her and pickard had discussed for everyday migraines. She has decided that she would agree to that treatment, but does not know the name of the med - [facility] [address].

## 2019-01-01 ENCOUNTER — Other Ambulatory Visit: Payer: Self-pay | Admitting: Family Medicine

## 2019-01-01 MED ORDER — TOPIRAMATE 25 MG PO TABS: 50.0000 mg | ORAL_TABLET | Freq: Two times a day (BID) | ORAL | 2 refills | Status: DC

## 2019-01-01 NOTE — Telephone Encounter (Signed)
Begin topamax 25 mg poqhs x 1 week, then 25 mg pobid for 1 week, then 25 am and 50 pm for 1 week, then 50 bid thereafter.  I will send an RX for 50 bid but she needs to wean up on it gradually to mitigate side effects.

## 2019-01-01 NOTE — Telephone Encounter (Signed)
Patient aware of results and of providers recommendations via telephone and mychart message

## 2019-01-21 ENCOUNTER — Ambulatory Visit: Payer: Self-pay | Admitting: Podiatry

## 2019-01-30 DIAGNOSIS — E1065 Type 1 diabetes mellitus with hyperglycemia: Secondary | ICD-10-CM | POA: Diagnosis not present

## 2019-01-30 DIAGNOSIS — E1040 Type 1 diabetes mellitus with diabetic neuropathy, unspecified: Secondary | ICD-10-CM | POA: Diagnosis not present

## 2019-02-06 ENCOUNTER — Telehealth: Payer: Self-pay | Admitting: Psychiatry

## 2019-02-06 NOTE — Telephone Encounter (Signed)
Patient stated she saw Lissa Hoard and was put on Buspar 15 mg., 2 tab 2x day to be sent to [facility] on [address], patient stated the wrong dosage was sent originally for 1 tab 2x a day

## 2019-02-07 ENCOUNTER — Other Ambulatory Visit: Payer: Self-pay

## 2019-02-07 MED ORDER — BUSPIRONE HCL 15 MG PO TABS: 30.0000 mg | ORAL_TABLET | Freq: Two times a day (BID) | ORAL | 1 refills | Status: DC

## 2019-02-07 NOTE — Telephone Encounter (Signed)
Submitted

## 2019-02-10 ENCOUNTER — Telehealth: Payer: Self-pay | Admitting: Podiatry

## 2019-02-10 NOTE — Telephone Encounter (Signed)
[name] with [facility] called wanting to know status update of medical records request she sent.

## 2019-02-13 ENCOUNTER — Ambulatory Visit (HOSPITAL_COMMUNITY)
Admission: RE | Admit: 2019-02-13 | Discharge: 2019-02-13 | Disposition: A | Discharge status: Home / Self Care | Payer: Medicare Other | Source: Ambulatory Visit | Attending: Family Medicine | Admitting: Family Medicine

## 2019-02-13 ENCOUNTER — Other Ambulatory Visit: Payer: Self-pay

## 2019-02-13 ENCOUNTER — Encounter: Payer: Self-pay | Admitting: Family Medicine

## 2019-02-13 ENCOUNTER — Ambulatory Visit (INDEPENDENT_AMBULATORY_CARE_PROVIDER_SITE_OTHER): Payer: Medicaid Other | Admitting: Family Medicine

## 2019-02-13 VITALS — BP 132/68 | HR 88 | Temp 98.3°F | Resp 16 | Ht 66.0 in

## 2019-02-13 DIAGNOSIS — M25562 Pain in left knee: Secondary | ICD-10-CM

## 2019-02-13 DIAGNOSIS — M25552 Pain in left hip: Secondary | ICD-10-CM

## 2019-02-13 MED ORDER — GABAPENTIN 300 MG PO CAPS: 300.0000 mg | ORAL_CAPSULE | Freq: Three times a day (TID) | ORAL | 3 refills | Status: DC

## 2019-02-13 MED ORDER — TOPIRAMATE 50 MG PO TABS: 50.0000 mg | ORAL_TABLET | Freq: Two times a day (BID) | ORAL | 5 refills | Status: DC
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
## 2019-02-14 ENCOUNTER — Other Ambulatory Visit: Payer: Self-pay | Admitting: Family Medicine

## 2019-02-14 MED ORDER — PREDNISONE 20 MG PO TABS: ORAL_TABLET | ORAL | 0 refills | Status: DC

## 2019-02-19 ENCOUNTER — Ambulatory Visit: Payer: Medicaid Other | Admitting: Psychiatry

## 2019-02-20 ENCOUNTER — Ambulatory Visit: Payer: Self-pay | Admitting: Podiatry

## 2019-02-21 ENCOUNTER — Ambulatory Visit: Payer: Medicaid Other | Admitting: Podiatry

## 2019-02-28 ENCOUNTER — Ambulatory Visit (INDEPENDENT_AMBULATORY_CARE_PROVIDER_SITE_OTHER): Payer: Medicaid Other | Admitting: Podiatry

## 2019-02-28 ENCOUNTER — Other Ambulatory Visit: Payer: Self-pay

## 2019-02-28 ENCOUNTER — Encounter: Payer: Self-pay | Admitting: Podiatry

## 2019-02-28 DIAGNOSIS — E1040 Type 1 diabetes mellitus with diabetic neuropathy, unspecified: Secondary | ICD-10-CM | POA: Diagnosis not present

## 2019-02-28 DIAGNOSIS — L84 Corns and callosities: Secondary | ICD-10-CM | POA: Diagnosis not present

## 2019-02-28 DIAGNOSIS — E1065 Type 1 diabetes mellitus with hyperglycemia: Secondary | ICD-10-CM | POA: Diagnosis not present

## 2019-02-28 DIAGNOSIS — E1149 Type 2 diabetes mellitus with other diabetic neurological complication: Secondary | ICD-10-CM

## 2019-03-06 ENCOUNTER — Ambulatory Visit: Payer: Self-pay | Admitting: Psychiatry

## 2019-03-06 NOTE — Progress Notes (Signed)
Subjective: 33 year old female presents the office today for pre-ulcerative calluses to both of his feet.  She states the calluses become very thick again causing irritation.  She states that it started to hurt as well.  Denies any redness or drainage or any open sores. Denies any systemic complaints such as fevers, chills, nausea, vomiting. No acute changes since last appointment, and no other complaints at this time.   Objective: AAO x3, NAD DP/PT pulses palpable bilaterally, CRT less than 3 seconds Hyperkeratotic lesions present bilateral medial hallux as well as submetatarsal 1 bilaterally.  There is tenderness of the submetatarsal 1 lesions.  Upon debridement there pre-ulcerative but there is no ongoing ulceration drainage or signs of infection today. No open lesions or pre-ulcerative lesions.  No pain with calf compression, swelling, warmth, erythema  Assessment: Pre-ulcerative with  Plan: -All treatment options discussed with the patient including all alternatives, risks, complications.  -Hyperkeratotic lesions were sharply debrided x4 without any complications or bleeding today.  We discussed moisturizer daily as well as continue with inserts on diabetic shoes.  Monitoring skin breakdown. -Patient encouraged to call the office with any questions, concerns, change in symptoms.   Trula Slade DPM

## 2019-03-17 ENCOUNTER — Telehealth: Payer: Self-pay | Admitting: Family Medicine

## 2019-03-17 NOTE — Telephone Encounter (Signed)
Patient calling to say that her hip is still hurting  Would like advice of what she should do  [phone]

## 2019-03-17 NOTE — Telephone Encounter (Signed)
Next step would be cortisone shot in hip.

## 2019-03-18 NOTE — Telephone Encounter (Signed)
I would try prednisone taper pack for possible sciatica first.  This may also help migraine.

## 2019-03-18 NOTE — Telephone Encounter (Signed)
Pt states that in her lov you had mentioned MRI to check for pinched nerve for her sciatica and would like to have that done instead of injections if ok with you.  Pt is also having migraines that have lasted for 2 days and would like to know if there was something else you could give her?

## 2019-03-19 NOTE — Telephone Encounter (Signed)
Pt was on pred 02/07/19 and per Dr. Dennard Schaumann ntbs - pt aware and apt made. Pt's HA is much better today

## 2019-03-21 ENCOUNTER — Other Ambulatory Visit: Payer: Self-pay

## 2019-03-21 ENCOUNTER — Ambulatory Visit: Payer: Medicaid Other | Admitting: Family Medicine

## 2019-03-21 ENCOUNTER — Encounter: Payer: Self-pay | Admitting: Family Medicine

## 2019-03-21 VITALS — BP 138/74 | HR 90 | Temp 98.6°F | Resp 14 | Ht 66.0 in | Wt 196.0 lb

## 2019-03-21 DIAGNOSIS — Z8669 Personal history of other diseases of the nervous system and sense organs: Secondary | ICD-10-CM

## 2019-03-21 DIAGNOSIS — M7062 Trochanteric bursitis, left hip: Secondary | ICD-10-CM | POA: Diagnosis not present

## 2019-03-21 MED ORDER — HYDROCODONE-ACETAMINOPHEN 5-325 MG PO TABS: 1.0000 | ORAL_TABLET | Freq: Four times a day (QID) | ORAL | 0 refills | Status: DC | PRN

## 2019-03-21 MED ORDER — SUMATRIPTAN SUCCINATE 100 MG PO TABS: 100.0000 mg | ORAL_TABLET | ORAL | 0 refills | Status: DC | PRN
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
## 2019-03-25 ENCOUNTER — Other Ambulatory Visit: Payer: Self-pay

## 2019-03-25 ENCOUNTER — Ambulatory Visit (INDEPENDENT_AMBULATORY_CARE_PROVIDER_SITE_OTHER): Payer: Medicaid Other | Admitting: Family Medicine

## 2019-03-25 ENCOUNTER — Telehealth: Payer: Self-pay | Admitting: Family Medicine

## 2019-03-25 DIAGNOSIS — J4531 Mild persistent asthma with (acute) exacerbation: Secondary | ICD-10-CM

## 2019-03-25 MED ORDER — HYDROCODONE-HOMATROPINE 5-1.5 MG/5ML PO SYRP: 5.0000 mL | ORAL_SOLUTION | Freq: Three times a day (TID) | ORAL | 0 refills | Status: DC | PRN

## 2019-03-25 MED ORDER — PREDNISONE 20 MG PO TABS: ORAL_TABLET | ORAL | 0 refills | Status: DC

## 2019-03-25 NOTE — Telephone Encounter (Signed)
Can you resend her Hycodan to [facility] - I set it up and I sent the pred already.

## 2019-03-25 NOTE — Telephone Encounter (Signed)
Patient was in the other other day to see dr pickard, now has got a sinus infection, wants to know if something can be called in for her?[phone] [facility])

## 2019-03-25 NOTE — Progress Notes (Signed)
Subjective:    Patient ID: [name], female    DOB: 09-07-1986, 33 y.o.   MRN: [id number]  HPI Patient is a very pleasant 33 year old Caucasian female who I am seen today as a telephone visit.  Patient consents to be seen over the telephone.  Phone call began at 258.  Phone call concluded at 306.  Patient's father has recently had a viral upper respiratory infection.  Over the weekend, the patient has been around her father helping him pack boxes in their garage.  He has been coughing with some rhinorrhea.  However also while packing boxes, she was exposed to copious amounts of dust and mold.  She has allergies to both.  Starting yesterday and into today she is developed severe wheezing.  She is reporting increasing shortness of breath.  The cough is dry.  She denies any hemoptysis.  She denies any purulent sputum.  But she is having increasing shortness of breath.  I can hear the patient audibly wheezing over the telephone with a quotation bark-like cough".  She denies any fever.  She denies any chest pain.  She denies any pleurisy.  She does report clear rhinorrhea.  She denies any sinus pain. And using her albuterol intermittently with temporary relief. Past Medical History:  Diagnosis Date  . Anxiety   . Asthma   . Asthma 11/10/2013  . Bipolar 1 disorder ([place])   . Depression   . Diabetic gastroparesis ([place])   . Diabetic neuropathy, type I diabetes mellitus ([place])   . Gastroparesis   . Headache(784.0)   . Hypertension   . Kidney stones   . Polyneuropathy in diabetes(357.2)   . 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[place])   . Tachycardia    baseline tachycardia   . 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 ANAL RECTAL MANOMETRY N/A 03/25/2018   Procedure: ANO RECTAL MANOMETRY;  Surgeon: Doran Stabler, MD;  Location: [facility];  Service: Gastroenterology;  Laterality: N/A;  .  CESAREAN SECTION     x 2  . CHOLECYSTECTOMY    . EYE SURGERY    . REFRACTIVE SURGERY Bilateral    Current Outpatient Medications on File Prior to Visit  Medication Sig Dispense Refill  . acetaminophen (TYLENOL) 500 MG tablet Take 500 mg by mouth daily as needed for mild pain.    Marland Kitchen albuterol (PROAIR HFA) 108 (90 Base) MCG/ACT inhaler INHALE 2 PUFFS BY MOUTH EVERY 6 HOURS AS NEEDED FOR SHORTNESS OF BREATH 18 g 3  . albuterol (PROVENTIL) (2.5 MG/3ML) 0.083% nebulizer solution Take 3 mLs (2.5 mg total) by nebulization every 6 (six) hours as needed for wheezing or shortness of breath. 150 mL 1  . busPIRone (BUSPAR) 15 MG tablet Take 2 tablets (30 mg total) by mouth 2 (two) times daily. 120 tablet 1  . butalbital-acetaminophen-caffeine (FIORICET, ESGIC) 50-325-40 MG tablet Take 1-2 tablets by mouth every 6 (six) hours as needed for headache. (Patient not taking: Reported on 03/21/2019) 20 tablet 0  . carbamazepine (EQUETRO) 200 MG CP12 12 hr capsule TAKE 2 CAPSULES EVERY MORNING AND TAKE 1 CAPSULE AT BEDTIME 90 capsule 1  . ciclopirox (PENLAC) 8 % solution Apply one coat to each toenail daily. Remove weekly with polish remover. 6.6 mL 11  . enalapril (VASOTEC) 10 MG tablet Take 10 mg by mouth daily.    Marland Kitchen etonogestrel (NEXPLANON) 68 MG IMPL implant 1  each by Subdermal route once.    . fluticasone (FLONASE) 50 MCG/ACT nasal spray Place 2 sprays into both nostrils daily. 16 g 2  . furosemide (LASIX) 40 MG tablet Take 40 mg by mouth. Every other day    . gabapentin (NEURONTIN) 300 MG capsule Take 1 capsule (300 mg total) by mouth 3 (three) times daily. 90 capsule 3  . HYDROcodone-acetaminophen (NORCO) 5-325 MG tablet Take 1 tablet by mouth every 6 (six) hours as needed for moderate pain. 20 tablet 0  . Insulin Human (INSULIN PUMP) SOLN Inject 1 each into the skin 3 times daily with meals, bedtime and 2 AM. Novolog/36-38 units/day basal; bolus sliding scale/adjusted per sliding scale by patient    .  lamoTRIgine (LAMICTAL) 100 MG tablet Take 1 tablet (100 mg total) by mouth 2 (two) times daily. 60 tablet 2  . lamoTRIgine (LAMICTAL) 200 MG tablet Take 1 tablet (200 mg total) by mouth 2 (two) times daily. 60 tablet 2  . linaclotide (LINZESS) 145 MCG CAPS capsule Take 1 capsule (145 mcg total) by mouth daily before breakfast. 30 capsule 3  . Lurasidone HCl (LATUDA) 120 MG TABS Take 1 tablet (120 mg total) by mouth daily. 30 tablet 2  . mometasone (ELOCON) 0.1 % ointment Apply topically daily. 45 g 0  . mometasone-formoterol (DULERA) 100-5 MCG/ACT AERO Inhale 2 puffs into the lungs 2 (two) times daily. 1 Inhaler 5  . montelukast (SINGULAIR) 10 MG tablet Take 1 tablet (10 mg total) by mouth at bedtime. 90 tablet 3  . NOVOLOG 100 UNIT/ML injection Inject 36-150 Units into the skin. Via insulin pump per sliding scale    . ondansetron (ZOFRAN) 8 MG tablet Take 1 tablet (8 mg total) by mouth every 8 (eight) hours as needed for nausea or vomiting. 20 tablet 0  . prochlorperazine (COMPAZINE) 25 MG suppository Place 1 suppository (25 mg total) rectally daily as needed for nausea or vomiting. 6 suppository 0  . promethazine (PHENERGAN) 25 MG tablet Take 0.5 tablets (12.5 mg total) by mouth daily as needed for nausea or vomiting. 30 tablet 1  . SUMAtriptan (IMITREX) 100 MG tablet Take 1 tablet (100 mg total) by mouth every 2 (two) hours as needed for migraine. May repeat in 2 hours if headache persists or recurs. 10 tablet 0  . tiZANidine (ZANAFLEX) 4 MG tablet Take 1 tablet (4 mg total) by mouth every 6 (six) hours as needed (headaches). 30 tablet 0  . topiramate (TOPAMAX) 50 MG tablet Take 1 tablet (50 mg total) by mouth 2 (two) times daily. Wean up on medication as directed by Dr. Dennard Schaumann 60 tablet 5  . traZODone (DESYREL) 50 MG tablet Take 1 tablet (50 mg total) by mouth at bedtime. 30 tablet 2  . valACYclovir (VALTREX) 1000 MG tablet 2 tabs po bid x 1 prn breakout 30 tablet 3   No current  facility-administered medications on file prior to visit.    Allergies  Allergen Reactions  . Toradol [Ketorolac Tromethamine]     Cannot take d/t kidney issues   . Ceftin Rash   Social History   Socioeconomic History  . Marital status: Legally Separated    Spouse name: Not on file  . Number of children: 2  . Years of education: Not on file  . Highest education level: Not on file  Occupational History  . Occupation: Materials engineer  Social Needs  . Financial resource strain: Not on file  . Food insecurity    Worry: Not  on file    Inability: Not on file  . Transportation needs    Medical: Not on file    Non-medical: Not on file  Tobacco Use  . Smoking status: Never Smoker  . Smokeless tobacco: Never Used  Substance and Sexual Activity  . Alcohol use: No  . Drug use: No  . Sexual activity: Yes    Partners: Male  Lifestyle  . Physical activity    Days per week: Not on file    Minutes per session: Not on file  . Stress: Not on file  Relationships  . Social Herbalist on phone: Not on file    Gets together: Not on file    Attends religious service: Not on file    Active member of club or organization: Not on file    Attends meetings of clubs or organizations: Not on file    Relationship status: Not on file  . Intimate partner violence    Fear of current or ex partner: Not on file    Emotionally abused: Not on file    Physically abused: Not on file    Forced sexual activity: Not on file  Other Topics Concern  . Not on file  Social History Narrative   Pt has a daughter and boyfriend.             Review of Systems  All other systems reviewed and are negative.      Objective:   Physical Exam   Physical exam could not be performed today as patient was seen over the telephone.  However I can hear her wheezing over the telephone.  She also is coughing frequently with a "croup-like cough"      Assessment & Plan:  1. Mild persistent asthma with  exacerbation Begin prednisone taper pack.  She will take 60 mg today, 60 mg tomorrow, 40 mg the next day, 40 mg on day 4, 20 mg on day 5, and 20 mg on day 6.  I will avoid using 60 mg for 5 days straight due to her poorly controlled diabetes and the risk of hyperglycemia.  Of also encouraged the patient to use albuterol nebulizers 2.5 mg every 6 hours as needed for wheezing.  She can also do Hycodan 1 teaspoon every 6 hours as needed for cough.  Reassess if cough is no better in 2 days or recheck immediately if worsening

## 2019-03-25 NOTE — Addendum Note (Signed)
Addended by: Shary Decamp B on: 03/25/2019 03:39 PM   Modules accepted: Orders

## 2019-03-28 ENCOUNTER — Telehealth: Payer: Self-pay | Admitting: Family Medicine

## 2019-03-28 MED ORDER — AZITHROMYCIN 250 MG PO TABS: ORAL_TABLET | ORAL | 0 refills | Status: DC

## 2019-03-28 NOTE — Telephone Encounter (Signed)
Pt is still having issues and wants to know if we can call her in a z pack to [facility].

## 2019-03-28 NOTE — Telephone Encounter (Signed)
Pt aware and med sent to pharm 

## 2019-04-01 ENCOUNTER — Encounter (HOSPITAL_COMMUNITY): Payer: Self-pay | Admitting: Emergency Medicine

## 2019-04-01 ENCOUNTER — Emergency Department (HOSPITAL_COMMUNITY)
Admission: EM | Admit: 2019-04-01 | Discharge: 2019-04-01 | Disposition: A | Discharge status: Home / Self Care | Payer: Medicare Other | Attending: Emergency Medicine | Admitting: Emergency Medicine

## 2019-04-01 ENCOUNTER — Emergency Department (HOSPITAL_COMMUNITY): Payer: Medicare Other

## 2019-04-01 ENCOUNTER — Ambulatory Visit: Payer: Self-pay | Admitting: Psychiatry

## 2019-04-01 ENCOUNTER — Ambulatory Visit (INDEPENDENT_AMBULATORY_CARE_PROVIDER_SITE_OTHER): Payer: Medicaid Other | Admitting: Family Medicine

## 2019-04-01 ENCOUNTER — Other Ambulatory Visit: Payer: Self-pay

## 2019-04-01 ENCOUNTER — Encounter: Payer: Self-pay | Admitting: Family Medicine

## 2019-04-01 VITALS — BP 144/78 | HR 98 | Temp 98.8°F | Resp 18 | Ht 66.0 in

## 2019-04-01 DIAGNOSIS — Z20828 Contact with and (suspected) exposure to other viral communicable diseases: Secondary | ICD-10-CM | POA: Diagnosis not present

## 2019-04-01 DIAGNOSIS — E1043 Type 1 diabetes mellitus with diabetic autonomic (poly)neuropathy: Secondary | ICD-10-CM | POA: Insufficient documentation

## 2019-04-01 DIAGNOSIS — R05 Cough: Secondary | ICD-10-CM | POA: Diagnosis not present

## 2019-04-01 DIAGNOSIS — R6889 Other general symptoms and signs: Secondary | ICD-10-CM | POA: Diagnosis not present

## 2019-04-01 DIAGNOSIS — N183 Chronic kidney disease, stage 3 (moderate): Secondary | ICD-10-CM | POA: Insufficient documentation

## 2019-04-01 DIAGNOSIS — E1040 Type 1 diabetes mellitus with diabetic neuropathy, unspecified: Secondary | ICD-10-CM | POA: Diagnosis not present

## 2019-04-01 DIAGNOSIS — E1065 Type 1 diabetes mellitus with hyperglycemia: Secondary | ICD-10-CM | POA: Diagnosis not present

## 2019-04-01 DIAGNOSIS — E1022 Type 1 diabetes mellitus with diabetic chronic kidney disease: Secondary | ICD-10-CM | POA: Diagnosis not present

## 2019-04-01 DIAGNOSIS — J45909 Unspecified asthma, uncomplicated: Secondary | ICD-10-CM | POA: Insufficient documentation

## 2019-04-01 DIAGNOSIS — J988 Other specified respiratory disorders: Secondary | ICD-10-CM | POA: Diagnosis not present

## 2019-04-01 DIAGNOSIS — J4521 Mild intermittent asthma with (acute) exacerbation: Secondary | ICD-10-CM | POA: Diagnosis not present

## 2019-04-01 DIAGNOSIS — Z79899 Other long term (current) drug therapy: Secondary | ICD-10-CM | POA: Insufficient documentation

## 2019-04-01 DIAGNOSIS — E103219 Type 1 diabetes mellitus with mild nonproliferative diabetic retinopathy with macular edema, unspecified eye: Secondary | ICD-10-CM | POA: Diagnosis not present

## 2019-04-01 DIAGNOSIS — I129 Hypertensive chronic kidney disease with stage 1 through stage 4 chronic kidney disease, or unspecified chronic kidney disease: Secondary | ICD-10-CM | POA: Insufficient documentation

## 2019-04-01 DIAGNOSIS — Z20822 Contact with and (suspected) exposure to covid-19: Secondary | ICD-10-CM

## 2019-04-01 DIAGNOSIS — R059 Cough, unspecified: Secondary | ICD-10-CM

## 2019-04-01 DIAGNOSIS — B9789 Other viral agents as the cause of diseases classified elsewhere: Secondary | ICD-10-CM

## 2019-04-01 MED ORDER — PREDNISONE 20 MG PO TABS: 40.0000 mg | ORAL_TABLET | Freq: Every day | ORAL | 0 refills | Status: DC

## 2019-04-01 MED ORDER — BENZONATATE 100 MG PO CAPS: 100.0000 mg | ORAL_CAPSULE | Freq: Three times a day (TID) | ORAL | 0 refills | Status: DC

## 2019-04-01 MED ORDER — ALBUTEROL SULFATE HFA 108 (90 BASE) MCG/ACT IN AERS
5.0000 | INHALATION_SPRAY | Freq: Once | RESPIRATORY_TRACT | Status: AC
  Administered 2019-04-01: 5 via RESPIRATORY_TRACT
  Filled 2019-04-01: qty 6.7

## 2019-04-01 NOTE — ED Triage Notes (Signed)
Pt sent from doc office with c/o cough, body aches and sore throat x 10 days. Pt states she finished both Prednisone and Z-pack course over the 10 days, but chest remains "tight feeling". Hx of asthma and DM.

## 2019-04-01 NOTE — Progress Notes (Signed)
Subjective:    Patient ID: [name], female    DOB: May 18, 1986, 33 y.o.   MRN: [id number]  HPI  03/25/19 Patient is a very pleasant 33 year old Caucasian female who I am seen today as a telephone visit.  Patient consents to be seen over the telephone.  Phone call began at 258.  Phone call concluded at 306.  Patient's father has recently had a viral upper respiratory infection.  Over the weekend, the patient has been around her father helping him pack boxes in their garage.  He has been coughing with some rhinorrhea.  However also while packing boxes, she was exposed to copious amounts of dust and mold.  She has allergies to both.  Starting yesterday and into today she is developed severe wheezing.  She is reporting increasing shortness of breath.  The cough is dry.  She denies any hemoptysis.  She denies any purulent sputum.  But she is having increasing shortness of breath.  I can hear the patient audibly wheezing over the telephone with a quotation bark-like cough".  She denies any fever.  She denies any chest pain.  She denies any pleurisy.  She does report clear rhinorrhea.  She denies any sinus pain.  She is  using her albuterol intermittently with temporary relief.  At that time, my plan was: Begin prednisone taper pack.  She will take 60 mg today, 60 mg tomorrow, 40 mg the next day, 40 mg on day 4, 20 mg on day 5, and 20 mg on day 6.  I will avoid using 60 mg for 5 days straight due to her poorly controlled diabetes and the risk of hyperglycemia.  Of also encouraged the patient to use albuterol nebulizers 2.5 mg every 6 hours as needed for wheezing.  She can also do Hycodan 1 teaspoon every 6 hours as needed for cough.  Reassess if cough is no better in 2 days or recheck immediately if worsening  04/01/19 She was given a zpack on 6/19 via telephone request and presents today much worse.  Over the weekend, the patient states she developed subjective fevers and chills.  She has not had an  objective fever.  She also developed body aches and a headache which she attributed to coughing so hard.  Today she is coughing constantly in the exam room.  Upon hearing her, I instituted our isolation protocol.  The patient was seen with proper PPE.  She has expiratory wheezes in all 4 lung fields.  She also has rails and rhonchi in the left lower lobe with diminished airflow throughout.  She has no respiratory distress.  She has no tachypnea.  She has no increased work of breathing although the patient recently had a nebulizer prior to arrival. Past Medical History:  Diagnosis Date  . Anxiety   . Asthma   . Asthma 11/10/2013  . Bipolar 1 disorder ([place])   . Depression   . Diabetic gastroparesis ([place])   . Diabetic neuropathy, type I diabetes mellitus ([place])   . Gastroparesis   . Headache(784.0)   . Hypertension   . Kidney stones   . Polyneuropathy in diabetes(357.2)   . 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[place])   . Tachycardia    baseline tachycardia   . 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 ANAL RECTAL MANOMETRY N/A 03/25/2018   Procedure: ANO RECTAL MANOMETRY;  Surgeon: Nelida Meuse III,  MD;  Location: [facility];  Service: Gastroenterology;  Laterality: N/A;  . CESAREAN SECTION     x 2  . CHOLECYSTECTOMY    . EYE SURGERY    . REFRACTIVE SURGERY Bilateral    Current Outpatient Medications on File Prior to Visit  Medication Sig Dispense Refill  . acetaminophen (TYLENOL) 500 MG tablet Take 500 mg by mouth daily as needed for mild pain.    Marland Kitchen albuterol (PROAIR HFA) 108 (90 Base) MCG/ACT inhaler INHALE 2 PUFFS BY MOUTH EVERY 6 HOURS AS NEEDED FOR SHORTNESS OF BREATH 18 g 3  . albuterol (PROVENTIL) (2.5 MG/3ML) 0.083% nebulizer solution Take 3 mLs (2.5 mg total) by nebulization every 6 (six) hours as needed for wheezing or shortness of breath. 150 mL 1  . azithromycin (ZITHROMAX) 250 MG  tablet 2 tabs po x 1 day; 1 tab po qd x days 2-5 6 tablet 0  . busPIRone (BUSPAR) 15 MG tablet Take 2 tablets (30 mg total) by mouth 2 (two) times daily. 120 tablet 1  . butalbital-acetaminophen-caffeine (FIORICET, ESGIC) 50-325-40 MG tablet Take 1-2 tablets by mouth every 6 (six) hours as needed for headache. (Patient not taking: Reported on 03/21/2019) 20 tablet 0  . carbamazepine (EQUETRO) 200 MG CP12 12 hr capsule TAKE 2 CAPSULES EVERY MORNING AND TAKE 1 CAPSULE AT BEDTIME 90 capsule 1  . ciclopirox (PENLAC) 8 % solution Apply one coat to each toenail daily. Remove weekly with polish remover. 6.6 mL 11  . enalapril (VASOTEC) 10 MG tablet Take 10 mg by mouth daily.    Marland Kitchen etonogestrel (NEXPLANON) 68 MG IMPL implant 1 each by Subdermal route once.    . fluticasone (FLONASE) 50 MCG/ACT nasal spray Place 2 sprays into both nostrils daily. 16 g 2  . furosemide (LASIX) 40 MG tablet Take 40 mg by mouth. Every other day    . gabapentin (NEURONTIN) 300 MG capsule Take 1 capsule (300 mg total) by mouth 3 (three) times daily. 90 capsule 3  . HYDROcodone-acetaminophen (NORCO) 5-325 MG tablet Take 1 tablet by mouth every 6 (six) hours as needed for moderate pain. 20 tablet 0  . HYDROcodone-homatropine (HYCODAN) 5-1.5 MG/5ML syrup Take 5 mLs by mouth every 8 (eight) hours as needed for cough. 120 mL 0  . Insulin Human (INSULIN PUMP) SOLN Inject 1 each into the skin 3 times daily with meals, bedtime and 2 AM. Novolog/36-38 units/day basal; bolus sliding scale/adjusted per sliding scale by patient    . lamoTRIgine (LAMICTAL) 100 MG tablet Take 1 tablet (100 mg total) by mouth 2 (two) times daily. 60 tablet 2  . lamoTRIgine (LAMICTAL) 200 MG tablet Take 1 tablet (200 mg total) by mouth 2 (two) times daily. 60 tablet 2  . linaclotide (LINZESS) 145 MCG CAPS capsule Take 1 capsule (145 mcg total) by mouth daily before breakfast. 30 capsule 3  . Lurasidone HCl (LATUDA) 120 MG TABS Take 1 tablet (120 mg total) by mouth  daily. 30 tablet 2  . mometasone (ELOCON) 0.1 % ointment Apply topically daily. 45 g 0  . mometasone-formoterol (DULERA) 100-5 MCG/ACT AERO Inhale 2 puffs into the lungs 2 (two) times daily. 1 Inhaler 5  . montelukast (SINGULAIR) 10 MG tablet Take 1 tablet (10 mg total) by mouth at bedtime. 90 tablet 3  . NOVOLOG 100 UNIT/ML injection Inject 36-150 Units into the skin. Via insulin pump per sliding scale    . ondansetron (ZOFRAN) 8 MG tablet Take 1 tablet (8 mg total) by mouth every  8 (eight) hours as needed for nausea or vomiting. 20 tablet 0  . predniSONE (DELTASONE) 20 MG tablet 3 tabs poqday 1-2, 2 tabs poqday 3-4, 1 tab poqday 5-6 12 tablet 0  . prochlorperazine (COMPAZINE) 25 MG suppository Place 1 suppository (25 mg total) rectally daily as needed for nausea or vomiting. 6 suppository 0  . promethazine (PHENERGAN) 25 MG tablet Take 0.5 tablets (12.5 mg total) by mouth daily as needed for nausea or vomiting. 30 tablet 1  . SUMAtriptan (IMITREX) 100 MG tablet Take 1 tablet (100 mg total) by mouth every 2 (two) hours as needed for migraine. May repeat in 2 hours if headache persists or recurs. 10 tablet 0  . tiZANidine (ZANAFLEX) 4 MG tablet Take 1 tablet (4 mg total) by mouth every 6 (six) hours as needed (headaches). 30 tablet 0  . topiramate (TOPAMAX) 50 MG tablet Take 1 tablet (50 mg total) by mouth 2 (two) times daily. Wean up on medication as directed by Dr. Dennard Schaumann 60 tablet 5  . traZODone (DESYREL) 50 MG tablet Take 1 tablet (50 mg total) by mouth at bedtime. 30 tablet 2   No current facility-administered medications on file prior to visit.    Allergies  Allergen Reactions  . Toradol [Ketorolac Tromethamine]     Cannot take d/t kidney issues   . Ceftin Rash   Social History   Socioeconomic History  . Marital status: Legally Separated    Spouse name: Not on file  . Number of children: 2  . Years of education: Not on file  . Highest education level: Not on file  Occupational  History  . Occupation: Materials engineer  Social Needs  . Financial resource strain: Not on file  . Food insecurity    Worry: Not on file    Inability: Not on file  . Transportation needs    Medical: Not on file    Non-medical: Not on file  Tobacco Use  . Smoking status: Never Smoker  . Smokeless tobacco: Never Used  Substance and Sexual Activity  . Alcohol use: No  . Drug use: No  . Sexual activity: Yes    Partners: Male  Lifestyle  . Physical activity    Days per week: Not on file    Minutes per session: Not on file  . Stress: Not on file  Relationships  . Social Herbalist on phone: Not on file    Gets together: Not on file    Attends religious service: Not on file    Active member of club or organization: Not on file    Attends meetings of clubs or organizations: Not on file    Relationship status: Not on file  . Intimate partner violence    Fear of current or ex partner: Not on file    Emotionally abused: Not on file    Physically abused: Not on file    Forced sexual activity: Not on file  Other Topics Concern  . Not on file  Social History Narrative   Pt has a daughter and boyfriend.             Review of Systems  All other systems reviewed and are negative.      Objective:   Physical Exam Vitals signs reviewed.  Constitutional:      General: She is not in acute distress.    Appearance: She is normal weight. She is ill-appearing. She is not toxic-appearing or diaphoretic.  Cardiovascular:  Rate and Rhythm: Normal rate and regular rhythm.  Pulmonary:     Effort: Pulmonary effort is normal.     Breath sounds: Wheezing, rhonchi and rales present.  Abdominal:     General: Bowel sounds are normal.     Palpations: Abdomen is soft.  Neurological:     Mental Status: She is alert.            Assessment & Plan:  1. Exacerbation of intermittent asthma, unspecified asthma severity  2. Suspected Covid-19 Virus Infection   Patient has  decompensated over the last week despite being treated for asthma with prednisone and albuterol.  She also has new flulike symptoms subjective fevers and worsening chest congestion along with body aches and a headache despite taking a Z-Pak in addition to the prednisone.  This makes me concerned that the patient may have COVID-19.  Patient needs a chest x-ray however given her possible infection I cannot do this as an outpatient.  Furthermore given the fact that her condition seems to be worsening despite her good pulse oximetry, I think she needs testing via the emergency room for COVID-19 along with a chest x-ray to rule out community-acquired pneumonia.  We will notify the emergency room of her pending arrival.  She is safe to be transported by personal vehicle

## 2019-04-01 NOTE — Discharge Instructions (Signed)
Please read attached information. If you experience any new or worsening signs or symptoms please return to the emergency room for evaluation. Please follow-up with your primary care provider or specialist as discussed. Please use medication prescribed only as directed and discontinue taking if you have any concerning signs or symptoms.   °

## 2019-04-01 NOTE — ED Provider Notes (Signed)
[facility] EMERGENCY DEPARTMENT Provider Note   CSN: [id number] Arrival date & time: 04/01/19  1235   History   Chief Complaint Chief Complaint  Patient presents with  . Cough  . Sore Throat  . Generalized Body Aches   HPI Fatumata Vanessa Kick is a 33 y.o. female.     HPI   33 year old female presents today with complaints of cough.  Patient notes approximate days ago she developed nasal congestion cough and body aches.  Patient notes she started having chills last night but denies any fever throughout this.  She notes she is slightly short of breath.  She notes this feels like an asthma exacerbation with the addition of the cough and respiratory complaints.  She was seen by her primary care provider who placed her on azithromycin and cough medication.  She was also placed on prednisone and stopped taking this approximately 4 days ago.  She notes prednisone did improve her symptoms but after she stopped taking them they return.  She has been using her albuterol at home.  She denies any close sick contacts.   Past Medical History:  Diagnosis Date  . Anxiety   . Asthma   . Asthma 11/10/2013  . Bipolar 1 disorder ([place])   . Depression   . Diabetic gastroparesis ([place])   . Diabetic neuropathy, type I diabetes mellitus ([place])   . Gastroparesis   . Headache(784.0)   . Hypertension   . Kidney stones   . Polyneuropathy in diabetes(357.2)   . 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[place])   . Tachycardia    baseline tachycardia   . 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 Pseudophakia of left eye 11/04/2018  . Constipation   . CKD (chronic kidney disease), stage III ([place]) 01/01/2017  . Diabetic nephropathy associated with type 1 diabetes mellitus ([place]) 03/01/2016  . Bipolar disorder in partial remission ([place]) 02/01/2016  . Chronic hypertension 02/01/2016  . DM  retinopathy ([place])   . Encounter for preconception consultation   . After-cataract obscuring vision, left 09/22/2014  . MDD (major depressive disorder), severe ([place]) 05/31/2014  . Drug overdose, intentional ([place]) 03/27/2014  . Poisoning by drug or medicinal substance 03/27/2014  . Asthma 11/10/2013  . Colles' fracture of left radius 10/11/2013  . Type I diabetes mellitus with complication, uncontrolled ([place]) 10/11/2013  . MDD (major depressive disorder), recurrent episode, severe ([place]) 09/26/2013  . Generalized anxiety disorder 09/26/2013  . Aphakia, right eye 09/14/2012  . Retinal detachment, tractional, left eye 06/11/2012  . Anemia 06/07/2012  . Eczema 06/07/2012  . Renal calculi 06/07/2012  . Hyperglycemia 04/30/2012  . Diabetic retinopathy associated with type 1 diabetes mellitus ([place]) 04/30/2012  . Polyneuropathy in diabetes(357.2) 04/30/2012  . H/O insertion of insulin pump 04/30/2012  . Polyneuropathy in diabetes ([place]) 04/30/2012  . CHOLELITHIASIS 08/10/2010  . ATRIAL TACHYCARDIA 08/08/2010  . Gastroparesis 08/08/2010  . LIVER FUNCTION TESTS, ABNORMAL, HX OF 08/08/2010  . Type 1 diabetes mellitus with ketoacidosis, uncontrolled ([place]) 08/02/2010  . BOWEL INTUSSUSCEPTION 08/02/2010  . PANCREATITIS, ACUTE, HX OF 08/02/2010  . ABDOMINAL PAIN, LEFT UPPER QUADRANT 08/01/2010    Past Surgical History:  Procedure Laterality Date  . ANAL RECTAL MANOMETRY N/A 03/25/2018   Procedure: ANO RECTAL MANOMETRY;  Surgeon: Doran Stabler, MD;  Location[facility];  Service: Gastroenterology;  Laterality: N/A;  . CESAREAN SECTION  x 2  . CHOLECYSTECTOMY    . EYE SURGERY    . REFRACTIVE SURGERY Bilateral      OB History    Gravida  2   Para  1   Term      Preterm      AB      Living  1     SAB      TAB      Ectopic      Multiple      Live Births  1            Home Medications    Prior to Admission medications   Medication Sig Start Date End Date  Taking? Authorizing Provider  acetaminophen (TYLENOL) 500 MG tablet Take 500 mg by mouth daily as needed for mild pain.    [provider]  albuterol (PROAIR HFA) 108 (90 Base) MCG/ACT inhaler INHALE 2 PUFFS BY MOUTH EVERY 6 HOURS AS NEEDED FOR SHORTNESS OF BREATH 11/12/18   Delsa Grana, PA-C  albuterol (PROVENTIL) (2.5 MG/3ML) 0.083% nebulizer solution Take 3 mLs (2.5 mg total) by nebulization every 6 (six) hours as needed for wheezing or shortness of breath. 11/12/18   Delsa Grana, PA-C  azithromycin (ZITHROMAX) 250 MG tablet 2 tabs po x 1 day; 1 tab po qd x days 2-5 03/28/19   [name], MD  busPIRone (BUSPAR) 15 MG tablet Take 2 tablets (30 mg total) by mouth 2 (two) times daily. 02/07/19   Cottle, Billey Co., MD  butalbital-acetaminophen-caffeine (FIORICET, ESGIC) [phone] MG tablet Take 1-2 tablets by mouth every 6 (six) hours as needed for headache. 11/05/18 11/05/19  [name], MD  carbamazepine (EQUETRO) 200 MG CP12 12 hr capsule TAKE 2 CAPSULES EVERY MORNING AND TAKE 1 CAPSULE AT BEDTIME 12/03/18   Cottle, Billey Co., MD  ciclopirox [facility]) 8 % solution Apply one coat to each toenail daily. Remove weekly with polish remover. 11/04/18   Marzetta Board, DPM  enalapril (VASOTEC) 10 MG tablet Take 10 mg by mouth daily.    [provider]  etonogestrel (NEXPLANON) 68 MG IMPL implant 1 each by Subdermal route once.    [provider]  fluticasone (FLONASE) 50 MCG/ACT nasal spray Place 2 sprays into both nostrils daily. 11/12/18   Delsa Grana, PA-C  furosemide (LASIX) 40 MG tablet Take 40 mg by mouth. Every other day    [provider]  gabapentin (NEURONTIN) 300 MG capsule Take 1 capsule (300 mg total) by mouth 3 (three) times daily. 02/13/19   [name], MD  HYDROcodone-acetaminophen (NORCO) 5-325 MG tablet Take 1 tablet by mouth every 6 (six) hours as needed for moderate pain. 03/21/19   [name], MD  HYDROcodone-homatropine  (HYCODAN) 5-1.5 MG/5ML syrup Take 5 mLs by mouth every 8 (eight) hours as needed for cough. 03/25/19   [name], MD  Insulin Human (INSULIN PUMP) SOLN Inject 1 each into the skin 3 times daily with meals, bedtime and 2 AM. Novolog/36-38 units/day basal; bolus sliding scale/adjusted per sliding scale by patient    [provider]  lamoTRIgine (LAMICTAL) 100 MG tablet Take 1 tablet (100 mg total) by mouth 2 (two) times daily. 12/03/18   Cottle, Billey Co., MD  lamoTRIgine (LAMICTAL) 200 MG tablet Take 1 tablet (200 mg total) by mouth 2 (two) times daily. 12/03/18   Cottle, Billey Co., MD  linaclotide [facility]) 145 MCG CAPS capsule Take 1 capsule (145 mcg total)  by mouth daily before breakfast. 04/10/18   Danis, Kirke Corin, MD  Lurasidone HCl (LATUDA) 120 MG TABS Take 1 tablet (120 mg total) by mouth daily. 12/03/18   Cottle, Billey Co., MD  mometasone (ELOCON) 0.1 % ointment Apply topically daily. 09/03/18   [name], MD  mometasone-formoterol (DULERA) 100-5 MCG/ACT AERO Inhale 2 puffs into the lungs 2 (two) times daily. 03/15/18   Delsa Grana, PA-C  montelukast (SINGULAIR) 10 MG tablet Take 1 tablet (10 mg total) by mouth at bedtime. 10/25/18   [name], MD  NOVOLOG 100 UNIT/ML injection Inject 36-150 Units into the skin. Via insulin pump per sliding scale 10/04/18   [provider]  ondansetron (ZOFRAN) 8 MG tablet Take 1 tablet (8 mg total) by mouth every 8 (eight) hours as needed for nausea or vomiting. 10/30/18   Daleen Bo, MD  prochlorperazine (COMPAZINE) 25 MG suppository Place 1 suppository (25 mg total) rectally daily as needed for nausea or vomiting. 03/07/18   Doran Stabler, MD  promethazine (PHENERGAN) 25 MG tablet Take 0.5 tablets (12.5 mg total) by mouth daily as needed for nausea or vomiting. 05/23/18   Doran Stabler, MD  SUMAtriptan (IMITREX) 100 MG tablet Take 1 tablet (100 mg total) by mouth every 2 (two) hours as needed for migraine.  May repeat in 2 hours if headache persists or recurs. 03/21/19   [name], MD  tiZANidine (ZANAFLEX) 4 MG tablet Take 1 tablet (4 mg total) by mouth every 6 (six) hours as needed (headaches). 12/16/18   [name], MD  topiramate (TOPAMAX) 50 MG tablet Take 1 tablet (50 mg total) by mouth 2 (two) times daily. Wean up on medication as directed by Dr. Dennard Schaumann 02/13/19   [name], MD  traZODone (DESYREL) 50 MG tablet Take 1 tablet (50 mg total) by mouth at bedtime. 12/03/18   Cottle, Billey Co., MD    Family History Family History  Problem Relation Age of Onset  . Hypertension Other   . Diabetes Mother        type 2  . Diabetes Brother        type 1  . Renal Disease Brother        renal failure  . Hypertension Brother   . Colon cancer Neg Hx   . Rectal cancer Neg Hx   . Esophageal cancer Neg Hx     Social History Social History   Tobacco Use  . Smoking status: Never Smoker  . Smokeless tobacco: Never Used  Substance Use Topics  . Alcohol use: No  . Drug use: No     Allergies   Toradol [ketorolac tromethamine] and Ceftin   Review of Systems Review of Systems  All other systems reviewed and are negative.    Physical Exam Updated Vital Signs BP 123/83 (BP Location: Left Arm)   Pulse 89   Temp 98.3 F (36.8 C) (Oral)   Resp 14   Wt 86.2 kg   SpO2 100%   BMI 30.67 kg/m   Physical Exam Vitals signs and nursing note reviewed.  Constitutional:      Appearance: She is well-developed.  HENT:     Head: Normocephalic and atraumatic.  Eyes:     General: No scleral icterus.       Right eye: No discharge.        Left eye: No discharge.     Conjunctiva/sclera: Conjunctivae normal.  Pupils: Pupils are equal, round, and reactive to light.  Neck:     Musculoskeletal: Normal range of motion.     Vascular: No JVD.     Trachea: No tracheal deviation.  Pulmonary:     Effort: Pulmonary effort is normal.     Breath sounds: No stridor.      Comments: Minor expiratory wheeze, otherwise lung sounds clear with no crackles or respiratory distress Neurological:     Mental Status: She is alert and oriented to person, place, and time.     Coordination: Coordination normal.  Psychiatric:        Behavior: Behavior normal.        Thought Content: Thought content normal.        Judgment: Judgment normal.      ED Treatments / Results  Labs (all labs ordered are listed, but only abnormal results are displayed) Labs Reviewed  NOVEL CORONAVIRUS, NAA (HOSPITAL ORDER, SEND-OUT TO REF LAB)    EKG None  Radiology Dg Chest Port 1 View  Result Date: 04/01/2019 CLINICAL DATA:  Cough. EXAM: PORTABLE CHEST 1 VIEW COMPARISON:  CT chest dated October 30, 2018. Chest x-ray dated August 12, 2018. FINDINGS: The heart size and mediastinal contours are within normal limits. Both lungs are clear. The visualized skeletal structures are unremarkable. IMPRESSION: No active disease. Electronically Signed   By: Titus Dubin M.D.   On: 04/01/2019 14:56    Procedures Procedures (including critical care time)  Medications Ordered in ED Medications  albuterol (VENTOLIN HFA) 108 (90 Base) MCG/ACT inhaler 5 puff (5 puffs Inhalation Given 04/01/19 1459)     Initial Impression / Assessment and Plan / ED Course  I have reviewed the triage vital signs and the nursing notes.  Pertinent labs & imaging results that were available during my care of the patient were reviewed by me and considered in my medical decision making (see chart for details).       Labs:   Imaging: DG chest portable 1 view  Consults:  Therapeutics: Albuterol  Discharge Meds: Prednisone  Assessment/Plan:   33 year old female presents today with likely viral respiratory infection.  She is well-appearing in no acute distress.  She has very reassuring vital signs with 100% oxygen saturation no tachycardia and is afebrile.  Her chest x-ray shows no acute abnormalities.  She  has been on antibiotics with no improvement giving rise to likely viral infection.  Patient will be tested for COVID although lower suspicion for this.  She is discharged home with strict return precautions.  She verbalized understanding and agreement to today's plan had no further questions or concerns at the time of discharge.  Final Clinical Impressions(s) / ED Diagnoses   Final diagnoses:  Cough    ED Discharge Orders    None       Francee Gentile 04/01/19 1521    Virgel Manifold, MD 04/02/19 0730

## 2019-04-02 LAB — NOVEL CORONAVIRUS, NAA (HOSP ORDER, SEND-OUT TO REF LAB; TAT 18-24 HRS): SARS-CoV-2, NAA: NOT DETECTED

## 2019-04-08 ENCOUNTER — Encounter

## 2019-04-08 ENCOUNTER — Ambulatory Visit: Payer: Medicaid Other | Admitting: Orthopaedic Surgery

## 2019-04-08 ENCOUNTER — Other Ambulatory Visit: Payer: Self-pay

## 2019-04-08 ENCOUNTER — Encounter: Payer: Self-pay | Admitting: Orthopaedic Surgery

## 2019-04-08 VITALS — BP 143/88 | HR 84 | Temp 98.0°F | Ht 66.0 in | Wt 202.0 lb

## 2019-04-08 DIAGNOSIS — M7062 Trochanteric bursitis, left hip: Secondary | ICD-10-CM

## 2019-04-08 NOTE — Progress Notes (Signed)
Subjective:    Patient ID: [name], female    DOB: 02-Mar-1986, 33 y.o.   MRN: [id number]  HPI She has been having pain in the left hip area for several months. She has no trauma.  Her pain is lateral.  It is getting gradually worse.  She has been seen by Dr. Dennard Schaumann at [facility] and had x-rays of the hip on the left which were negative.  I have his notes and have reviewed them and the x-rays.  She continues to have pain.  She cannot take NSAIDs. She has an insulin pump and her A1C is around 8.  She has no redness, she has no pain in the hip with motion once moving about.  She has no numbness or weakness.   Review of Systems  Constitutional: Positive for activity change.  Respiratory: Positive for shortness of breath. Negative for cough, wheezing and stridor.   Musculoskeletal: Positive for arthralgias, gait problem and myalgias.  Neurological: Positive for headaches.  Psychiatric/Behavioral: The patient is nervous/anxious.   All other systems reviewed and are negative.  For Review of Systems, all other systems reviewed and are negative.  The following is a summary of the past history medically, past history surgically, known current medicines, social history and family history.  This information is gathered electronically by the computer from prior information and documentation.  I review this each visit and have found including this information at this point in the chart is beneficial and informative.   Past Medical History:  Diagnosis Date  . Anxiety   . Asthma   . Asthma 11/10/2013  . Bipolar 1 disorder ([place])   . Depression   . Diabetic gastroparesis ([place])   . Diabetic neuropathy, type I diabetes mellitus ([place])   . Gastroparesis   . Headache(784.0)   . Hypertension   . Kidney stones   . Polyneuropathy in diabetes(357.2)   . 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 Tachycardia    baseline tachycardia   . 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 ANAL RECTAL MANOMETRY N/A 03/25/2018   Procedure: ANO RECTAL MANOMETRY;  Surgeon: Doran Stabler, MD;  Location: [facility];  Service: Gastroenterology;  Laterality: N/A;  . CESAREAN SECTION     x 2  . CHOLECYSTECTOMY    . EYE SURGERY    . REFRACTIVE SURGERY Bilateral     Current Outpatient Medications on File Prior to Visit  Medication Sig Dispense Refill  . acetaminophen (TYLENOL) 500 MG tablet Take 500 mg by mouth daily as needed for mild pain.    Marland Kitchen albuterol (PROAIR HFA) 108 (90 Base) MCG/ACT inhaler INHALE 2 PUFFS BY MOUTH EVERY 6 HOURS AS NEEDED FOR SHORTNESS OF BREATH 18 g 3  . albuterol (PROVENTIL) (2.5 MG/3ML) 0.083% nebulizer solution Take 3 mLs (2.5 mg total) by nebulization every 6 (six) hours as needed for wheezing or shortness of breath. 150 mL 1  . busPIRone (BUSPAR) 15 MG tablet Take 2 tablets (30 mg total) by mouth 2 (two) times daily. 120 tablet 1  . butalbital-acetaminophen-caffeine (FIORICET, ESGIC) 50-325-40 MG tablet Take 1-2 tablets by mouth every 6 (six) hours as needed for headache. 20 tablet 0  . carbamazepine (EQUETRO) 200 MG CP12 12 hr capsule TAKE 2 CAPSULES EVERY MORNING AND TAKE 1 CAPSULE AT BEDTIME 90 capsule 1  . ciclopirox (PENLAC) 8 % solution Apply  one coat to each toenail daily. Remove weekly with polish remover. 6.6 mL 11  . enalapril (VASOTEC) 10 MG tablet Take 10 mg by mouth daily.    Marland Kitchen etonogestrel (NEXPLANON) 68 MG IMPL implant 1 each by Subdermal route once.    . fluticasone (FLONASE) 50 MCG/ACT nasal spray Place 2 sprays into both nostrils daily. 16 g 2  . furosemide (LASIX) 40 MG tablet Take 40 mg by mouth. Every other day    . gabapentin (NEURONTIN) 300 MG capsule Take 1 capsule (300 mg total) by mouth 3 (three) times daily. 90 capsule 3  . HYDROcodone-acetaminophen (NORCO) 5-325 MG tablet Take 1 tablet by mouth every 6 (six) hours as needed for  moderate pain. 20 tablet 0  . HYDROcodone-homatropine (HYCODAN) 5-1.5 MG/5ML syrup Take 5 mLs by mouth every 8 (eight) hours as needed for cough. 120 mL 0  . Insulin Human (INSULIN PUMP) SOLN Inject 1 each into the skin 3 times daily with meals, bedtime and 2 AM. Novolog/36-38 units/day basal; bolus sliding scale/adjusted per sliding scale by patient    . lamoTRIgine (LAMICTAL) 100 MG tablet Take 1 tablet (100 mg total) by mouth 2 (two) times daily. 60 tablet 2  . lamoTRIgine (LAMICTAL) 200 MG tablet Take 1 tablet (200 mg total) by mouth 2 (two) times daily. 60 tablet 2  . linaclotide (LINZESS) 145 MCG CAPS capsule Take 1 capsule (145 mcg total) by mouth daily before breakfast. 30 capsule 3  . Lurasidone HCl (LATUDA) 120 MG TABS Take 1 tablet (120 mg total) by mouth daily. 30 tablet 2  . mometasone (ELOCON) 0.1 % ointment Apply topically daily. 45 g 0  . mometasone-formoterol (DULERA) 100-5 MCG/ACT AERO Inhale 2 puffs into the lungs 2 (two) times daily. 1 Inhaler 5  . montelukast (SINGULAIR) 10 MG tablet Take 1 tablet (10 mg total) by mouth at bedtime. 90 tablet 3  . NOVOLOG 100 UNIT/ML injection Inject 36-150 Units into the skin. Via insulin pump per sliding scale    . ondansetron (ZOFRAN) 8 MG tablet Take 1 tablet (8 mg total) by mouth every 8 (eight) hours as needed for nausea or vomiting. 20 tablet 0  . predniSONE (DELTASONE) 20 MG tablet Take 2 tablets (40 mg total) by mouth daily. 10 tablet 0  . prochlorperazine (COMPAZINE) 25 MG suppository Place 1 suppository (25 mg total) rectally daily as needed for nausea or vomiting. 6 suppository 0  . promethazine (PHENERGAN) 25 MG tablet Take 0.5 tablets (12.5 mg total) by mouth daily as needed for nausea or vomiting. 30 tablet 1  . SUMAtriptan (IMITREX) 100 MG tablet Take 1 tablet (100 mg total) by mouth every 2 (two) hours as needed for migraine. May repeat in 2 hours if headache persists or recurs. 10 tablet 0  . tiZANidine (ZANAFLEX) 4 MG tablet  Take 1 tablet (4 mg total) by mouth every 6 (six) hours as needed (headaches). 30 tablet 0  . topiramate (TOPAMAX) 50 MG tablet Take 1 tablet (50 mg total) by mouth 2 (two) times daily. Wean up on medication as directed by Dr. Dennard Schaumann 60 tablet 5  . traZODone (DESYREL) 50 MG tablet Take 1 tablet (50 mg total) by mouth at bedtime. 30 tablet 2  . azithromycin (ZITHROMAX) 250 MG tablet 2 tabs po x 1 day; 1 tab po qd x days 2-5 (Patient not taking: Reported on 04/08/2019) 6 tablet 0  . benzonatate (TESSALON) 100 MG capsule Take 1 capsule (100 mg total) by mouth every 8 (eight)  hours. (Patient not taking: Reported on 04/08/2019) 21 capsule 0   No current facility-administered medications on file prior to visit.     Social History   Socioeconomic History  . Marital status: Legally Separated    Spouse name: Not on file  . Number of children: 2  . Years of education: Not on file  . Highest education level: Not on file  Occupational History  . Occupation: Materials engineer  Social Needs  . Financial resource strain: Not on file  . Food insecurity    Worry: Not on file    Inability: Not on file  . Transportation needs    Medical: Not on file    Non-medical: Not on file  Tobacco Use  . Smoking status: Never Smoker  . Smokeless tobacco: Never Used  Substance and Sexual Activity  . Alcohol use: No  . Drug use: No  . Sexual activity: Yes    Partners: Male  Lifestyle  . Physical activity    Days per week: Not on file    Minutes per session: Not on file  . Stress: Not on file  Relationships  . Social Herbalist on phone: Not on file    Gets together: Not on file    Attends religious service: Not on file    Active member of club or organization: Not on file    Attends meetings of clubs or organizations: Not on file    Relationship status: Not on file  . Intimate partner violence    Fear of current or ex partner: Not on file    Emotionally abused: Not on file    Physically abused:  Not on file    Forced sexual activity: Not on file  Other Topics Concern  . Not on file  Social History Narrative   Pt has a daughter and boyfriend.           Family History  Problem Relation Age of Onset  . Hypertension Other   . Diabetes Mother        type 2  . Diabetes Brother        type 1  . Renal Disease Brother        renal failure  . Hypertension Brother   . Colon cancer Neg Hx   . Rectal cancer Neg Hx   . Esophageal cancer Neg Hx     BP (!) 143/88   Pulse 84   Ht 5\' 6"  (1.676 m)   Wt 202 lb (91.6 kg)   BMI 32.60 kg/m   Body mass index is 32.6 kg/m.     Objective:   Physical Exam Vitals signs reviewed.  Constitutional:      Appearance: She is well-developed.  HENT:     Head: Normocephalic and atraumatic.  Eyes:     Conjunctiva/sclera: Conjunctivae normal.     Pupils: Pupils are equal, round, and reactive to light.  Neck:     Musculoskeletal: Normal range of motion and neck supple.  Cardiovascular:     Rate and Rhythm: Normal rate and regular rhythm.  Pulmonary:     Effort: Pulmonary effort is normal.  Abdominal:     Palpations: Abdomen is soft.  Musculoskeletal:       Legs:  Skin:    General: Skin is warm and dry.  Neurological:     Mental Status: She is alert and oriented to person, place, and time.     Cranial Nerves: No cranial nerve deficit.  Motor: No abnormal muscle tone.     Coordination: Coordination normal.     Deep Tendon Reflexes: Reflexes are normal and symmetric. Reflexes normal.  Psychiatric:        Behavior: Behavior normal.        Thought Content: Thought content normal.        Judgment: Judgment normal.           Assessment & Plan:   Encounter Diagnosis  Name Primary?  . Trochanteric bursitis, left hip Yes   PROCEDURE NOTE:  The patient request injection, verbal consent was obtained.  The left trochanteric area of the hip was prepped appropriately after time out was performed.   Sterile technique was  observed and injection of 1 cc of Depo-Medrol 40 mg with several cc's of plain xylocaine. Anesthesia was provided by ethyl chloride and a 20-gauge needle was used to inject the hip area. The injection was tolerated well.  A band aid dressing was applied.  The patient was advised to apply ice later today and tomorrow to the injection sight as needed.  She can use BioFreeze to the area as needed.  Return in two weeks.  Call if any problem.  Precautions discussed.   Electronically Signed Sanjuana Kava, MD 6/30/20208:31 AM

## 2019-04-09 ENCOUNTER — Encounter: Payer: Self-pay | Admitting: Psychiatry

## 2019-04-09 ENCOUNTER — Ambulatory Visit: Payer: Medicaid Other | Admitting: Psychiatry

## 2019-04-09 DIAGNOSIS — F411 Generalized anxiety disorder: Secondary | ICD-10-CM

## 2019-04-09 DIAGNOSIS — F314 Bipolar disorder, current episode depressed, severe, without psychotic features: Secondary | ICD-10-CM

## 2019-04-09 MED ORDER — PRAMIPEXOLE DIHYDROCHLORIDE 0.25 MG PO TABS: ORAL_TABLET | ORAL | 1 refills | Status: DC

## 2019-04-09 NOTE — Progress Notes (Signed)
Kingston Guiles [id number] 09-May-1986 33 y.o.  Subjective:   Patient ID:  [name] is a 34 y.o. (DOB April 29, 1986) female.  Chief Complaint:  Chief Complaint  Patient presents with  . Follow-up    bipolar and anxiety  . Medication Management    HPI [name] presents to the office today for follow-up of bipolar disorder and generalized anxiety disorder.  Patient arrived late.  Last for bit visit was November 21, 2018 with [name], PA-C.  For anxiety buspirone was increased from 50 mg twice daily to 30 mg twice daily.  Having a really hard time for 4 weeks with depression.  Crying spells, no joy, low energy, kids and H only thing keeping her going.  Feels like meds aren't working.  Custody battle with exH. This causes a lot of anxiety and stress.   Devastating to hear about death of Lissa Hoard.  B diabetic died last year with DM and kidney failure.  Weight going up. Hates weight gain effects of meds.   Angry easily without reason.  Don't like the way I am right now.  Consistent with meds.  No sig SI other than fleeting rarely.  Scattered concentration and racing thoughts randomly.  Isolating and decreased interest and enjoyment in things.  Some desire to spend money with manic sx in the past but has none to spend.  Sleep OK.  Remarried April.  Disability hearing 1st one coming up July 31.  Spoke with lawyer this week.    Past Psychiatric Medication Trials: Latuda 120, buspirone 30 twice daily, lamotrigine 300 twice daily, Equetro 900 daily, hydroxyzine, sertraline, duloxetine, lithium felt slowed down, Seroquel, gabapentin, Abilify 15, clonazepam, trazodone, Xanax , brief Vraylar  History of suicidal ideation and about 5 suicide attempts with about 4 psychiatric hospitalizations  Review of Systems:  Review of Systems  Musculoskeletal: Positive for arthralgias.  Neurological: Negative for tremors and weakness.    Medications: I have reviewed the patient's  current medications.  Current Outpatient Medications  Medication Sig Dispense Refill  . acetaminophen (TYLENOL) 500 MG tablet Take 500 mg by mouth daily as needed for mild pain.    Marland Kitchen albuterol (PROAIR HFA) 108 (90 Base) MCG/ACT inhaler INHALE 2 PUFFS BY MOUTH EVERY 6 HOURS AS NEEDED FOR SHORTNESS OF BREATH 18 g 3  . amLODipine (NORVASC) 2.5 MG tablet Take 2.5 mg by mouth daily.    . busPIRone (BUSPAR) 15 MG tablet Take 2 tablets (30 mg total) by mouth 2 (two) times daily. 120 tablet 1  . butalbital-acetaminophen-caffeine (FIORICET, ESGIC) 50-325-40 MG tablet Take 1-2 tablets by mouth every 6 (six) hours as needed for headache. 20 tablet 0  . carbamazepine (EQUETRO) 200 MG CP12 12 hr capsule TAKE 2 CAPSULES EVERY MORNING AND TAKE 1 CAPSULE AT BEDTIME 90 capsule 1  . enalapril (VASOTEC) 10 MG tablet Take 10 mg by mouth daily.    Marland Kitchen etonogestrel (NEXPLANON) 68 MG IMPL implant 1 each by Subdermal route once.    . fluticasone (FLONASE) 50 MCG/ACT nasal spray Place 2 sprays into both nostrils daily. 16 g 2  . furosemide (LASIX) 40 MG tablet Take 40 mg by mouth. Every other day    . gabapentin (NEURONTIN) 300 MG capsule Take 1 capsule (300 mg total) by mouth 3 (three) times daily. 90 capsule 3  . HYDROcodone-acetaminophen (NORCO) 5-325 MG tablet Take 1 tablet by mouth every 6 (six) hours as needed for moderate pain. 20 tablet 0  . Insulin Human (INSULIN PUMP)  SOLN Inject 1 each into the skin 3 times daily with meals, bedtime and 2 AM. Novolog/36-38 units/day basal; bolus sliding scale/adjusted per sliding scale by patient    . lamoTRIgine (LAMICTAL) 100 MG tablet Take 1 tablet (100 mg total) by mouth 2 (two) times daily. 60 tablet 2  . lamoTRIgine (LAMICTAL) 200 MG tablet Take 1 tablet (200 mg total) by mouth 2 (two) times daily. 60 tablet 2  . linaclotide (LINZESS) 145 MCG CAPS capsule Take 1 capsule (145 mcg total) by mouth daily before breakfast. 30 capsule 3  . Lurasidone HCl (LATUDA) 120 MG TABS  Take 1 tablet (120 mg total) by mouth daily. 30 tablet 2  . mometasone-formoterol (DULERA) 100-5 MCG/ACT AERO Inhale 2 puffs into the lungs 2 (two) times daily. 1 Inhaler 5  . montelukast (SINGULAIR) 10 MG tablet Take 1 tablet (10 mg total) by mouth at bedtime. 90 tablet 3  . NOVOLOG 100 UNIT/ML injection Inject 36-150 Units into the skin. Via insulin pump per sliding scale    . promethazine (PHENERGAN) 25 MG tablet Take 0.5 tablets (12.5 mg total) by mouth daily as needed for nausea or vomiting. 30 tablet 1  . SUMAtriptan (IMITREX) 100 MG tablet Take 1 tablet (100 mg total) by mouth every 2 (two) hours as needed for migraine. May repeat in 2 hours if headache persists or recurs. 10 tablet 0  . topiramate (TOPAMAX) 50 MG tablet Take 1 tablet (50 mg total) by mouth 2 (two) times daily. Wean up on medication as directed by Dr. Dennard Schaumann 60 tablet 5  . traZODone (DESYREL) 50 MG tablet Take 1 tablet (50 mg total) by mouth at bedtime. 30 tablet 2  . albuterol (PROVENTIL) (2.5 MG/3ML) 0.083% nebulizer solution Take 3 mLs (2.5 mg total) by nebulization every 6 (six) hours as needed for wheezing or shortness of breath. 150 mL 1  . azithromycin (ZITHROMAX) 250 MG tablet 2 tabs po x 1 day; 1 tab po qd x days 2-5 (Patient not taking: Reported on 04/08/2019) 6 tablet 0  . benzonatate (TESSALON) 100 MG capsule Take 1 capsule (100 mg total) by mouth every 8 (eight) hours. (Patient not taking: Reported on 04/08/2019) 21 capsule 0  . ciclopirox (PENLAC) 8 % solution Apply one coat to each toenail daily. Remove weekly with polish remover. (Patient not taking: Reported on 04/09/2019) 6.6 mL 11  . HYDROcodone-homatropine (HYCODAN) 5-1.5 MG/5ML syrup Take 5 mLs by mouth every 8 (eight) hours as needed for cough. 120 mL 0  . mometasone (ELOCON) 0.1 % ointment Apply topically daily. 45 g 0  . ondansetron (ZOFRAN) 8 MG tablet Take 1 tablet (8 mg total) by mouth every 8 (eight) hours as needed for nausea or vomiting. 20 tablet 0   . pramipexole (MIRAPEX) 0.25 MG tablet 1/2 twice daily for 1 day, then 1 twice daily for 1 weeks. If no improvement then 1 1/2 tablets twice daily. 90 tablet 1  . predniSONE (DELTASONE) 20 MG tablet Take 2 tablets (40 mg total) by mouth daily. (Patient not taking: Reported on 04/09/2019) 10 tablet 0  . prochlorperazine (COMPAZINE) 25 MG suppository Place 1 suppository (25 mg total) rectally daily as needed for nausea or vomiting. (Patient not taking: Reported on 04/09/2019) 6 suppository 0  . tiZANidine (ZANAFLEX) 4 MG tablet Take 1 tablet (4 mg total) by mouth every 6 (six) hours as needed (headaches). (Patient not taking: Reported on 04/09/2019) 30 tablet 0   No current facility-administered medications for this visit.  Medication Side Effects: None  Allergies:  Allergies  Allergen Reactions  . Toradol [Ketorolac Tromethamine]     Cannot take d/t kidney issues   . Ceftin Rash    Past Medical History:  Diagnosis Date  . Anxiety   . Asthma   . Asthma 11/10/2013  . Bipolar 1 disorder ([place])   . Depression   . Diabetic gastroparesis ([place])   . Diabetic neuropathy, type I diabetes mellitus ([place])   . Gastroparesis   . Headache(784.0)   . Hypertension   . Kidney stones   . Polyneuropathy in diabetes(357.2)   . 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 Tachycardia    baseline tachycardia   . 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 Hypertension Other   . Diabetes Mother        type 2  . Diabetes Brother        type 1  . Renal Disease Brother        renal failure  . Hypertension Brother   . Colon cancer Neg Hx   . Rectal cancer Neg Hx   . Esophageal cancer Neg Hx     Social History   Socioeconomic History  . Marital status: Legally Separated    Spouse name: Not on file  . Number of children: 2  . Years of education: Not on file  . Highest education level: Not on file   Occupational History  . Occupation: Materials engineer  Social Needs  . Financial resource strain: Not on file  . Food insecurity    Worry: Not on file    Inability: Not on file  . Transportation needs    Medical: Not on file    Non-medical: Not on file  Tobacco Use  . Smoking status: Never Smoker  . Smokeless tobacco: Never Used  Substance and Sexual Activity  . Alcohol use: No  . Drug use: No  . Sexual activity: Yes    Partners: Male  Lifestyle  . Physical activity    Days per week: Not on file    Minutes per session: Not on file  . Stress: Not on file  Relationships  . Social Herbalist on phone: Not on file    Gets together: Not on file    Attends religious service: Not on file    Active member of club or organization: Not on file    Attends meetings of clubs or organizations: Not on file    Relationship status: Not on file  . Intimate partner violence    Fear of current or ex partner: Not on file    Emotionally abused: Not on file    Physically abused: Not on file    Forced sexual activity: Not on file  Other Topics Concern  . Not on file  Social History Narrative   Pt has a daughter and boyfriend.           Past Medical History, Surgical history, Social history, and Family history were reviewed and updated as appropriate.   Please see review of systems for further details on the patient's review from today.   Objective:   Physical Exam:  There were no vitals taken for this visit.  Physical Exam Constitutional:      General: She is not in acute distress.    Appearance: She is well-developed.  Musculoskeletal:  General: No deformity.  Neurological:     Mental Status: She is alert and oriented to person, place, and time.     Coordination: Coordination normal.  Psychiatric:        Attention and Perception: She is attentive. She does not perceive auditory hallucinations.        Mood and Affect: Mood is anxious and depressed. Affect is  tearful. Affect is not labile, blunt, angry or inappropriate.        Speech: Speech normal.        Behavior: Behavior normal.        Thought Content: Thought content normal. Thought content does not include homicidal or suicidal ideation. Thought content does not include homicidal or suicidal plan.        Cognition and Memory: Cognition normal.        Judgment: Judgment normal.     Comments: Insight intact. No auditory or visual hallucinations. No delusions.      Lab Review:     Component Value Date/Time   NA 135 10/30/2018 1027   K 4.9 10/30/2018 1027   CL 105 10/30/2018 1027   CO2 24 10/30/2018 1027   GLUCOSE 284 (H) 10/30/2018 1027   BUN 23 (H) 10/30/2018 1027   CREATININE 1.33 (H) 10/30/2018 1027   CREATININE 1.81 (H) 06/17/2018 1231   CALCIUM 8.7 (L) 10/30/2018 1027   PROT 5.8 (L) 06/17/2018 1231   ALBUMIN 3.6 09/29/2017 0451   AST 17 06/17/2018 1231   ALT 19 06/17/2018 1231   ALKPHOS 107 09/29/2017 0451   BILITOT 0.3 06/17/2018 1231   GFRNONAA 52 (L) 10/30/2018 1027   GFRNONAA 36 (L) 06/17/2018 1231   GFRAA >60 10/30/2018 1027   GFRAA 42 (L) 06/17/2018 1231       Component Value Date/Time   WBC 6.8 10/30/2018 1027   RBC 4.05 10/30/2018 1027   HGB 11.9 (L) 10/30/2018 1027   HCT 37.4 10/30/2018 1027   PLT 276 10/30/2018 1027   MCV 92.3 10/30/2018 1027   MCH 29.4 10/30/2018 1027   MCHC 31.8 10/30/2018 1027   RDW 12.3 10/30/2018 1027   LYMPHSABS 1.5 10/30/2018 1027   MONOABS 0.4 10/30/2018 1027   EOSABS 0.3 10/30/2018 1027   BASOSABS 0.1 10/30/2018 1027    No results found for: POCLITH, LITHIUM   No results found for: PHENYTOIN, PHENOBARB, VALPROATE, CBMZ   .res Assessment: Plan:    [name] was seen today for follow-up and medication management.  Diagnoses and all orders for this visit:  Bipolar I disorder with depression, severe ([place]) -     pramipexole (MIRAPEX) 0.25 MG tablet; 1/2 twice daily for 1 day, then 1 twice daily for 1 weeks. If no  improvement then 1 1/2 tablets twice daily.  Generalized anxiety disorder  Patient with a history of multiple med failures recent worsening of bipolar depression.  Had been doing relatively well on this combination of medication.  Discern about potential weight gain with medications.  She is taking the major mood stabilizers with low weight gain potential.  Therefore I am hesitant to switch major mood stabilizers.  Discussed the pros and cons of switching mood stabilizers.  Could consider ultra low-dose lithium.  Instead will potentiate with pramipexole off label for bipolar depression Start pramipexole 0.25 mg one half twice daily for 1 day, then 1 twice daily for 1 week. If no improvement increase to 1-1/2 tablets twice daily. Discussed the risk of triggering mania and to stop it if it does so  and contact the office.  Discussed side effects of the medication.  This option has the advantage that it could potentially be discontinued in the future if no longer needed.  Discussed the necessary polypharmacy at this time to manage her bipolar symptoms and anxiety.  Discussed potential metabolic side effects associated with atypical antipsychotics, as well as potential risk for movement side effects. Advised pt to contact office if movement side effects occur.   Counseled patient regarding potential benefits, risks, and side effects of Lamictal to include potential risk of Stevens-Johnson syndrome. Advised patient to stop taking Lamictal and contact office immediately if rash develops and to seek urgent medical attention if rash is severe and/or spreading quickly.  No other med changes today  This was a 40-minute appointment  Follow-up 4 to 6 weeks as soon as available.  [name], MD, DFAPA   Please see After Visit Summary for patient specific instructions.  Future Appointments  Date Time Provider [place]  04/22/2019  9:50 AM Sanjuana Kava, MD [facility] None  05/29/2019  9:45 AM  Trula Slade, DPM [facility]    No orders of the defined types were placed in this encounter.   -------------------------------

## 2019-04-14 NOTE — Telephone Encounter (Signed)
She never followed through on the Domperidone 6 months ago. Would you like to schedule her for a follow up?

## 2019-04-14 NOTE — Telephone Encounter (Signed)
Patient called said that she never heard anything for the medication and she is still having trouble with her gastroparesis.Marland Kitchen she would also like to know if she needs a follow up to get a refill for promethazine for now

## 2019-04-15 MED ORDER — PROMETHAZINE HCL 25 MG PO TABS: 12.5000 mg | ORAL_TABLET | Freq: Three times a day (TID) | ORAL | 1 refills | Status: DC | PRN

## 2019-04-15 NOTE — Telephone Encounter (Signed)
She needs to investigate the [place] option for domperidone with info previously given.  Info can be given again.  Promethazine refilled in the meantime.

## 2019-04-16 ENCOUNTER — Other Ambulatory Visit: Payer: Self-pay

## 2019-04-16 MED ORDER — AMBULATORY NON FORMULARY MEDICATION: 2 refills | Status: DC

## 2019-04-16 NOTE — Telephone Encounter (Signed)
Left msg to return call

## 2019-04-16 NOTE — Telephone Encounter (Signed)
Spoke to patient, she does want a Rx for the Domperidone now. She said she never followed up with the [place] last time as she was feeling better and managing ok with the promethazine. New Rx printed and info mailed to patients home address.

## 2019-04-22 ENCOUNTER — Encounter: Payer: Self-pay | Admitting: Orthopaedic Surgery

## 2019-04-22 ENCOUNTER — Ambulatory Visit: Payer: Medicare Other | Admitting: Orthopaedic Surgery

## 2019-04-22 ENCOUNTER — Other Ambulatory Visit: Payer: Self-pay

## 2019-04-22 VITALS — BP 130/84 | HR 86 | Temp 98.2°F | Ht 66.0 in | Wt 194.0 lb

## 2019-04-22 DIAGNOSIS — M7062 Trochanteric bursitis, left hip: Secondary | ICD-10-CM | POA: Diagnosis not present

## 2019-04-22 MED ORDER — PREDNISONE 5 MG (21) PO TBPK: ORAL_TABLET | ORAL | 0 refills | Status: DC

## 2019-04-22 NOTE — Progress Notes (Signed)
Patient HB:ZJIRCVE [name], female DOB:Sep 01, 1986, 32 y.o. LFY:[id number]  Chief Complaint  Patient presents with  . Hip Pain    Not much better after injection     HPI  [name] is a 33 y.o. female who has left hip bursitis pain.  She is a little better after the injection las time but still has some pain. She has no new trauma, no redness.   Body mass index is 31.31 kg/m.  ROS  Review of Systems  Constitutional: Positive for activity change.  Respiratory: Positive for shortness of breath. Negative for cough, wheezing and stridor.   Musculoskeletal: Positive for arthralgias, gait problem and myalgias.  Neurological: Positive for headaches.  Psychiatric/Behavioral: The patient is nervous/anxious.   All other systems reviewed and are negative.   All other systems reviewed and are negative.  The following is a summary of the past history medically, past history surgically, known current medicines, social history and family history.  This information is gathered electronically by the computer from prior information and documentation.  I review this each visit and have found including this information at this point in the chart is beneficial and informative.    Past Medical History:  Diagnosis Date  . Anxiety   . Asthma   . Asthma 11/10/2013  . Bipolar 1 disorder ([place])   . Depression   . Diabetic gastroparesis ([place])   . Diabetic neuropathy, type I diabetes mellitus ([place])   . Gastroparesis   . Headache(784.0)   . Hypertension   . Kidney stones   . Polyneuropathy in diabetes(357.2)   . 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[place])   . Tachycardia    baseline tachycardia   . 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 ANAL RECTAL MANOMETRY N/A 03/25/2018   Procedure: ANO RECTAL MANOMETRY;  Surgeon: Doran Stabler, MD;  Location: [facility];  Service:  Gastroenterology;  Laterality: N/A;  . CESAREAN SECTION     x 2  . CHOLECYSTECTOMY    . EYE SURGERY    . REFRACTIVE SURGERY Bilateral     Family History  Problem Relation Age of Onset  . Hypertension Other   . Diabetes Mother        type 2  . Diabetes Brother        type 1  . Renal Disease Brother        renal failure  . Hypertension Brother   . Colon cancer Neg Hx   . Rectal cancer Neg Hx   . Esophageal cancer Neg Hx     Social History Social History   Tobacco Use  . Smoking status: Never Smoker  . Smokeless tobacco: Never Used  Substance Use Topics  . Alcohol use: No  . Drug use: No    Allergies  Allergen Reactions  . Toradol [Ketorolac Tromethamine]     Cannot take d/t kidney issues   . Ceftin Rash    Current Outpatient Medications  Medication Sig Dispense Refill  . acetaminophen (TYLENOL) 500 MG tablet Take 500 mg by mouth daily as needed for mild pain.    Marland Kitchen albuterol (PROAIR HFA) 108 (90 Base) MCG/ACT inhaler INHALE 2 PUFFS BY MOUTH EVERY 6 HOURS AS NEEDED FOR SHORTNESS OF BREATH 18 g 3  . albuterol (PROVENTIL) (2.5 MG/3ML) 0.083% nebulizer solution Take 3 mLs (2.5 mg total) by nebulization every 6 (six) hours as needed for wheezing  or shortness of breath. 150 mL 1  . AMBULATORY NON FORMULARY MEDICATION DOMPERIDONE 10 mg, twice a day. One with breakfast One with supper Dispense 60 tablets  2 refills 60 tablet 2  . amLODipine (NORVASC) 2.5 MG tablet Take 2.5 mg by mouth daily.    Marland Kitchen azithromycin (ZITHROMAX) 250 MG tablet 2 tabs po x 1 day; 1 tab po qd x days 2-5 (Patient not taking: Reported on 04/08/2019) 6 tablet 0  . benzonatate (TESSALON) 100 MG capsule Take 1 capsule (100 mg total) by mouth every 8 (eight) hours. (Patient not taking: Reported on 04/08/2019) 21 capsule 0  . busPIRone (BUSPAR) 15 MG tablet Take 2 tablets (30 mg total) by mouth 2 (two) times daily. 120 tablet 1  . butalbital-acetaminophen-caffeine (FIORICET, ESGIC) 50-325-40 MG tablet Take  1-2 tablets by mouth every 6 (six) hours as needed for headache. 20 tablet 0  . carbamazepine (EQUETRO) 200 MG CP12 12 hr capsule TAKE 2 CAPSULES EVERY MORNING AND TAKE 1 CAPSULE AT BEDTIME 90 capsule 1  . ciclopirox (PENLAC) 8 % solution Apply one coat to each toenail daily. Remove weekly with polish remover. (Patient not taking: Reported on 04/09/2019) 6.6 mL 11  . enalapril (VASOTEC) 10 MG tablet Take 10 mg by mouth daily.    Marland Kitchen etonogestrel (NEXPLANON) 68 MG IMPL implant 1 each by Subdermal route once.    . fluticasone (FLONASE) 50 MCG/ACT nasal spray Place 2 sprays into both nostrils daily. 16 g 2  . furosemide (LASIX) 40 MG tablet Take 40 mg by mouth. Every other day    . gabapentin (NEURONTIN) 300 MG capsule Take 1 capsule (300 mg total) by mouth 3 (three) times daily. 90 capsule 3  . HYDROcodone-acetaminophen (NORCO) 5-325 MG tablet Take 1 tablet by mouth every 6 (six) hours as needed for moderate pain. 20 tablet 0  . HYDROcodone-homatropine (HYCODAN) 5-1.5 MG/5ML syrup Take 5 mLs by mouth every 8 (eight) hours as needed for cough. 120 mL 0  . Insulin Human (INSULIN PUMP) SOLN Inject 1 each into the skin 3 times daily with meals, bedtime and 2 AM. Novolog/36-38 units/day basal; bolus sliding scale/adjusted per sliding scale by patient    . lamoTRIgine (LAMICTAL) 100 MG tablet Take 1 tablet (100 mg total) by mouth 2 (two) times daily. 60 tablet 2  . lamoTRIgine (LAMICTAL) 200 MG tablet Take 1 tablet (200 mg total) by mouth 2 (two) times daily. 60 tablet 2  . linaclotide (LINZESS) 145 MCG CAPS capsule Take 1 capsule (145 mcg total) by mouth daily before breakfast. 30 capsule 3  . Lurasidone HCl (LATUDA) 120 MG TABS Take 1 tablet (120 mg total) by mouth daily. 30 tablet 2  . mometasone (ELOCON) 0.1 % ointment Apply topically daily. 45 g 0  . mometasone-formoterol (DULERA) 100-5 MCG/ACT AERO Inhale 2 puffs into the lungs 2 (two) times daily. 1 Inhaler 5  . montelukast (SINGULAIR) 10 MG tablet Take  1 tablet (10 mg total) by mouth at bedtime. 90 tablet 3  . NOVOLOG 100 UNIT/ML injection Inject 36-150 Units into the skin. Via insulin pump per sliding scale    . ondansetron (ZOFRAN) 8 MG tablet Take 1 tablet (8 mg total) by mouth every 8 (eight) hours as needed for nausea or vomiting. 20 tablet 0  . pramipexole (MIRAPEX) 0.25 MG tablet 1/2 twice daily for 1 day, then 1 twice daily for 1 weeks. If no improvement then 1 1/2 tablets twice daily. 90 tablet 1  . predniSONE (STERAPRED UNI-PAK  21 TAB) 5 MG (21) TBPK tablet Take 6 pills first day; 5 pills second day; 4 pills third day; 3 pills fourth day; 2 pills next day and 1 pill last day. 21 tablet 0  . prochlorperazine (COMPAZINE) 25 MG suppository Place 1 suppository (25 mg total) rectally daily as needed for nausea or vomiting. (Patient not taking: Reported on 04/09/2019) 6 suppository 0  . promethazine (PHENERGAN) 25 MG tablet Take 0.5 tablets (12.5 mg total) by mouth every 8 (eight) hours as needed for nausea or vomiting. 30 tablet 1  . SUMAtriptan (IMITREX) 100 MG tablet Take 1 tablet (100 mg total) by mouth every 2 (two) hours as needed for migraine. May repeat in 2 hours if headache persists or recurs. 10 tablet 0  . tiZANidine (ZANAFLEX) 4 MG tablet Take 1 tablet (4 mg total) by mouth every 6 (six) hours as needed (headaches). (Patient not taking: Reported on 04/09/2019) 30 tablet 0  . topiramate (TOPAMAX) 50 MG tablet Take 1 tablet (50 mg total) by mouth 2 (two) times daily. Wean up on medication as directed by Dr. Dennard Schaumann 60 tablet 5  . traZODone (DESYREL) 50 MG tablet Take 1 tablet (50 mg total) by mouth at bedtime. 30 tablet 2   No current facility-administered medications for this visit.      Physical Exam  Blood pressure 130/84, pulse 86, temperature 98.2 F (36.8 C), height 5\' 6"  (1.676 m), weight 194 lb (88 kg).  Constitutional: overall normal hygiene, normal nutrition, well developed, normal grooming, normal body habitus. Assistive  device:none  Musculoskeletal: gait and station Limp none, muscle tone and strength are normal, no tremors or atrophy is present.  .  Neurological: coordination overall normal.  Deep tendon reflex/nerve stretch intact.  Sensation normal.  Cranial nerves II-XII intact.   Skin:   Normal overall no scars, lesions, ulcers or rashes. No psoriasis.  Psychiatric: Alert and oriented x 3.  Recent memory intact, remote memory unclear.  Normal mood and affect. Well groomed.  Good eye contact.  Cardiovascular: overall no swelling, no varicosities, no edema bilaterally, normal temperatures of the legs and arms, no clubbing, cyanosis and good capillary refill.  Lymphatic: palpation is normal.  Left hip is tender over the trochanteric area. She has full ROM of the left hip.  NV intact.  All other systems reviewed and are negative   The patient has been educated about the nature of the problem(s) and counseled on treatment options.  The patient appeared to understand what I have discussed and is in agreement with it.  Encounter Diagnosis  Name Primary?  . Trochanteric bursitis, left hip Yes    PLAN Call if any problems.  Precautions discussed.  Continue current medications. I will call in prednisone dose pack.  Return to clinic 2 weeks   Electronically Signed Sanjuana Kava, MD 7/14/202010:56 AM

## 2019-04-29 ENCOUNTER — Other Ambulatory Visit: Payer: Self-pay | Admitting: Psychiatry

## 2019-04-29 DIAGNOSIS — I10 Essential (primary) hypertension: Secondary | ICD-10-CM | POA: Diagnosis not present

## 2019-04-29 DIAGNOSIS — E1022 Type 1 diabetes mellitus with diabetic chronic kidney disease: Secondary | ICD-10-CM | POA: Diagnosis not present

## 2019-04-29 DIAGNOSIS — N183 Chronic kidney disease, stage 3 (moderate): Secondary | ICD-10-CM | POA: Diagnosis not present

## 2019-04-29 DIAGNOSIS — E1040 Type 1 diabetes mellitus with diabetic neuropathy, unspecified: Secondary | ICD-10-CM | POA: Diagnosis not present

## 2019-04-29 DIAGNOSIS — Z794 Long term (current) use of insulin: Secondary | ICD-10-CM | POA: Diagnosis not present

## 2019-05-01 DIAGNOSIS — E1065 Type 1 diabetes mellitus with hyperglycemia: Secondary | ICD-10-CM | POA: Diagnosis not present

## 2019-05-01 DIAGNOSIS — E1040 Type 1 diabetes mellitus with diabetic neuropathy, unspecified: Secondary | ICD-10-CM | POA: Diagnosis not present

## 2019-05-06 ENCOUNTER — Other Ambulatory Visit: Payer: Self-pay | Admitting: Psychiatry

## 2019-05-06 ENCOUNTER — Telehealth: Payer: Self-pay | Admitting: Psychiatry

## 2019-05-06 ENCOUNTER — Ambulatory Visit: Payer: Medicaid Other | Admitting: Orthopaedic Surgery

## 2019-05-06 ENCOUNTER — Other Ambulatory Visit: Payer: Self-pay

## 2019-05-06 ENCOUNTER — Encounter: Payer: Self-pay | Admitting: Orthopaedic Surgery

## 2019-05-06 VITALS — BP 150/83 | HR 84 | Temp 97.3°F | Ht 66.0 in | Wt 197.0 lb

## 2019-05-06 DIAGNOSIS — M7062 Trochanteric bursitis, left hip: Secondary | ICD-10-CM | POA: Diagnosis not present

## 2019-05-06 NOTE — Telephone Encounter (Signed)
For panic and anxiety increase gabapentin to 2 of the 300 mg capsules 3 times a day.  She is facing a disability hearing on July 31 and that is probably creating more of her anxiety.  That will be over fairly quickly.

## 2019-05-06 NOTE — Telephone Encounter (Signed)
Pt called. Having a lot of anxiety,chest pains and panic attacks. Need something for this.

## 2019-05-06 NOTE — Progress Notes (Signed)
PROCEDURE NOTE:  The patient request injection, verbal consent was obtained.  The left trochanteric area of the hip was prepped appropriately after time out was performed.   Sterile technique was observed and injection of 1 cc of Depo-Medrol 40 mg with several cc's of plain xylocaine. Anesthesia was provided by ethyl chloride and a 20-gauge needle was used to inject the hip area. The injection was tolerated well.  A band aid dressing was applied.  The patient was advised to apply ice later today and tomorrow to the injection sight as needed.  Encounter Diagnosis  Name Primary?  . Trochanteric bursitis, left hip Yes   Begin PT.  Return in three weeks.  Call if any problem.  Precautions discussed.   Electronically Signed Sanjuana Kava, MD 7/28/202010:35 AM

## 2019-05-07 NOTE — Telephone Encounter (Signed)
Patient given information and verbalized understanding. Pt states she has plenty of gabapentin currently for the increase but will call when she's getting low. Encouraged pt about her disability hearing.

## 2019-05-08 DIAGNOSIS — E103513 Type 1 diabetes mellitus with proliferative diabetic retinopathy with macular edema, bilateral: Secondary | ICD-10-CM | POA: Diagnosis not present

## 2019-05-08 DIAGNOSIS — H35372 Puckering of macula, left eye: Secondary | ICD-10-CM | POA: Diagnosis not present

## 2019-05-08 DIAGNOSIS — E103523 Type 1 diabetes mellitus with proliferative diabetic retinopathy with traction retinal detachment involving the macula, bilateral: Secondary | ICD-10-CM | POA: Diagnosis not present

## 2019-05-10 ENCOUNTER — Telehealth: Payer: Self-pay | Admitting: Psychiatry

## 2019-05-10 ENCOUNTER — Other Ambulatory Visit: Payer: Self-pay | Admitting: Psychiatry

## 2019-05-10 NOTE — Telephone Encounter (Signed)
RTC  Pt called Saturday [place] anxiety and panic attacks.  Crying all the time and a lot of anxiety.  Increased gabapentin 600 TID and tired from it.  Under a lot of stress right now with family and home. $. A lot of depression still.  Disability hearing went well yesterday.  Has been on CBZ 400 BID before.  Increase CBZ to 400 AM and 200 N and 200 HS.  Limited options for changes on call due to lack of access to all history in the chart.  She' not suicidal.  Su[pportive therapy that meds will not likely change stress related sx. She agrees. [name], MD, DFAPA

## 2019-05-13 ENCOUNTER — Other Ambulatory Visit: Payer: Self-pay

## 2019-05-13 ENCOUNTER — Ambulatory Visit (INDEPENDENT_AMBULATORY_CARE_PROVIDER_SITE_OTHER): Payer: Medicaid Other | Admitting: Family Medicine

## 2019-05-13 ENCOUNTER — Encounter: Payer: Self-pay | Admitting: Family Medicine

## 2019-05-13 VITALS — BP 126/84 | HR 106 | Temp 98.8°F | Resp 18 | Ht 66.0 in | Wt 195.0 lb

## 2019-05-13 DIAGNOSIS — Z8669 Personal history of other diseases of the nervous system and sense organs: Secondary | ICD-10-CM | POA: Diagnosis not present

## 2019-05-13 MED ORDER — SUMATRIPTAN SUCCINATE 100 MG PO TABS: 100.0000 mg | ORAL_TABLET | ORAL | 0 refills | Status: DC | PRN

## 2019-05-13 MED ORDER — LAMOTRIGINE 200 MG PO TABS: 200.0000 mg | ORAL_TABLET | Freq: Two times a day (BID) | ORAL | 2 refills | Status: DC

## 2019-05-13 MED ORDER — AIMOVIG 70 MG/ML ~~LOC~~ SOAJ: 70.0000 mg | SUBCUTANEOUS | 3 refills | Status: DC
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
## 2019-05-27 ENCOUNTER — Ambulatory Visit (INDEPENDENT_AMBULATORY_CARE_PROVIDER_SITE_OTHER): Payer: Medicaid Other | Admitting: Orthopaedic Surgery

## 2019-05-27 ENCOUNTER — Encounter: Payer: Self-pay | Admitting: Orthopaedic Surgery

## 2019-05-27 ENCOUNTER — Other Ambulatory Visit: Payer: Self-pay

## 2019-05-27 VITALS — BP 130/96 | HR 83 | Temp 98.2°F | Ht 66.0 in | Wt 199.0 lb

## 2019-05-27 DIAGNOSIS — M7062 Trochanteric bursitis, left hip: Secondary | ICD-10-CM

## 2019-05-27 NOTE — Progress Notes (Signed)
PROCEDURE NOTE:  The patient request injection, verbal consent was obtained.  The left trochanteric area of the hip was prepped appropriately after time out was performed.   Sterile technique was observed and injection of 1 cc of Depo-Medrol 40 mg with several cc's of plain xylocaine. Anesthesia was provided by ethyl chloride and a 20-gauge needle was used to inject the hip area. The injection was tolerated well.  A band aid dressing was applied.  The patient was advised to apply ice later today and tomorrow to the injection sight as needed.  Return in one month.  PT has been scheduled starting Friday.  Call if any problem.  Precautions discussed.   Electronically Signed Sanjuana Kava, MD 8/18/202011:03 AM

## 2019-05-29 ENCOUNTER — Other Ambulatory Visit: Payer: Self-pay | Admitting: Family Medicine

## 2019-05-29 ENCOUNTER — Telehealth: Payer: Self-pay | Admitting: Family Medicine

## 2019-05-29 ENCOUNTER — Ambulatory Visit: Payer: Medicaid Other | Admitting: Podiatry

## 2019-05-29 MED ORDER — HYDROCODONE-ACETAMINOPHEN 5-325 MG PO TABS: 1.0000 | ORAL_TABLET | Freq: Four times a day (QID) | ORAL | 0 refills | Status: DC | PRN

## 2019-05-29 NOTE — Telephone Encounter (Signed)
Patient called requesting something for pain for her hip. She has been seeing Dr. Luna Glasgow and he has given her some injections but he is out of the office and they advised her to call and see if we could call her in something for pain. She states she starts physically therapy next week.  CB# [phone]

## 2019-05-29 NOTE — Telephone Encounter (Signed)
I will call out 20 Norco until she can see Dr. Luna Glasgow

## 2019-05-30 ENCOUNTER — Ambulatory Visit (HOSPITAL_COMMUNITY): Payer: Medicare Other | Admitting: Physical Therapy

## 2019-05-30 DIAGNOSIS — E1065 Type 1 diabetes mellitus with hyperglycemia: Secondary | ICD-10-CM | POA: Diagnosis not present

## 2019-05-30 DIAGNOSIS — E1040 Type 1 diabetes mellitus with diabetic neuropathy, unspecified: Secondary | ICD-10-CM | POA: Diagnosis not present

## 2019-05-30 NOTE — Telephone Encounter (Signed)
Pt aware.

## 2019-06-01 ENCOUNTER — Other Ambulatory Visit: Payer: Self-pay | Admitting: Psychiatry

## 2019-06-02 ENCOUNTER — Ambulatory Visit (HOSPITAL_COMMUNITY): Payer: Medicare Other | Attending: Orthopaedic Surgery | Admitting: Physical Therapy

## 2019-06-02 ENCOUNTER — Encounter (HOSPITAL_COMMUNITY): Payer: Self-pay | Admitting: Physical Therapy

## 2019-06-02 ENCOUNTER — Other Ambulatory Visit: Payer: Self-pay

## 2019-06-02 ENCOUNTER — Ambulatory Visit (INDEPENDENT_AMBULATORY_CARE_PROVIDER_SITE_OTHER): Payer: Medicaid Other | Admitting: Podiatry

## 2019-06-02 VITALS — Temp 97.4°F

## 2019-06-02 DIAGNOSIS — E1149 Type 2 diabetes mellitus with other diabetic neurological complication: Secondary | ICD-10-CM

## 2019-06-02 DIAGNOSIS — D492 Neoplasm of unspecified behavior of bone, soft tissue, and skin: Secondary | ICD-10-CM | POA: Diagnosis not present

## 2019-06-02 DIAGNOSIS — M25552 Pain in left hip: Secondary | ICD-10-CM | POA: Insufficient documentation

## 2019-06-02 DIAGNOSIS — L84 Corns and callosities: Secondary | ICD-10-CM

## 2019-06-02 DIAGNOSIS — M6281 Muscle weakness (generalized): Secondary | ICD-10-CM | POA: Diagnosis not present

## 2019-06-02 DIAGNOSIS — R29898 Other symptoms and signs involving the musculoskeletal system: Secondary | ICD-10-CM | POA: Insufficient documentation

## 2019-06-02 MED ORDER — UREA 40 % EX LOTN: 1.0000 "application " | TOPICAL_LOTION | Freq: Every day | CUTANEOUS | 0 refills | Status: DC
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
## 2019-06-02 NOTE — Patient Instructions (Signed)
Medbridge: Access Code: [email or web address]  URL: [email or web address]  Date: 06/02/2019  Prepared by: Clarene Critchley   Exercises [place] Isometric Hip Abduction with Belt - 10 reps - 1 sets - 10 seconds hold - 1x daily - 7x weekly Supine Hip Adduction Isometric with Ball - 10 reps - 1 sets - 10 seconds hold - 1x daily - 7x weekly

## 2019-06-03 ENCOUNTER — Other Ambulatory Visit: Payer: Self-pay | Admitting: Psychiatry

## 2019-06-03 DIAGNOSIS — F314 Bipolar disorder, current episode depressed, severe, without psychotic features: Secondary | ICD-10-CM

## 2019-06-04 NOTE — Progress Notes (Signed)
Subjective: 33 year old female presents the office today for pre-ulcerative calluses to both of his feet.  She states the calluses become very thick again causing irritation.  She denies any skin breakdown or drainage or pus. No swelling or redness.  She has no other concerns.  No open sores.  She did not get the urea cream.No acute changes since last appointment, and no other complaints at this time.   Objective: AAO x3, NAD DP/PT pulses palpable bilaterally, CRT less than 3 seconds Hyperkeratotic lesions present bilateral medial hallux as well as submetatarsal 1 bilaterally.  There is subjective tenderness of the submetatarsal 1 lesions.  Upon debridement there pre-ulcerative but there is no ongoing ulceration drainage or signs of infection today. No open lesions or pre-ulcerative lesions.  No pain with calf compression, swelling, warmth, erythema  Assessment: Pre-ulcerative calluses/dry skin  Plan: -All treatment options discussed with the patient including all alternatives, risks, complications.  -Hyperkeratotic lesions were sharply debrided x4 without any complications or bleeding today.  Prescribed urea cream to apply topically to the calluses. -Discussed importance of daily foot inspection. -Discussed the importance of glucose control.  She states that her A1c has been high. -Patient encouraged to call the office with any questions, concerns, change in symptoms.   Trula Slade DPM

## 2019-06-05 ENCOUNTER — Ambulatory Visit (INDEPENDENT_AMBULATORY_CARE_PROVIDER_SITE_OTHER): Payer: Medicaid Other | Admitting: Psychiatry

## 2019-06-05 ENCOUNTER — Other Ambulatory Visit: Payer: Self-pay

## 2019-06-05 ENCOUNTER — Ambulatory Visit (HOSPITAL_COMMUNITY): Payer: Medicare Other | Admitting: Physical Therapy

## 2019-06-05 ENCOUNTER — Encounter: Payer: Self-pay | Admitting: Psychiatry

## 2019-06-05 ENCOUNTER — Encounter (HOSPITAL_COMMUNITY): Payer: Self-pay | Admitting: Physical Therapy

## 2019-06-05 DIAGNOSIS — F314 Bipolar disorder, current episode depressed, severe, without psychotic features: Secondary | ICD-10-CM

## 2019-06-05 DIAGNOSIS — F4001 Agoraphobia with panic disorder: Secondary | ICD-10-CM

## 2019-06-05 DIAGNOSIS — M25552 Pain in left hip: Secondary | ICD-10-CM

## 2019-06-05 DIAGNOSIS — M6281 Muscle weakness (generalized): Secondary | ICD-10-CM

## 2019-06-05 DIAGNOSIS — F411 Generalized anxiety disorder: Secondary | ICD-10-CM

## 2019-06-05 DIAGNOSIS — R29898 Other symptoms and signs involving the musculoskeletal system: Secondary | ICD-10-CM | POA: Diagnosis not present

## 2019-06-05 MED ORDER — PRAMIPEXOLE DIHYDROCHLORIDE ER 0.75 MG PO TB24: 0.7500 mg | ORAL_TABLET | Freq: Every evening | ORAL | 1 refills | Status: DC

## 2019-06-05 MED ORDER — LAMOTRIGINE 150 MG PO TABS: 300.0000 mg | ORAL_TABLET | Freq: Two times a day (BID) | ORAL | 1 refills | Status: DC

## 2019-06-05 NOTE — Patient Instructions (Signed)
Switch pramipexole to pramipexole extended release 0.75 mg tablet 1 each evening  Switch Equetro to 2 capsules in the morning and at night to help with afternoon tiredness.  To help prevent daytime panic start gabapentin 1 each morning and take another if needed.

## 2019-06-05 NOTE — Therapy (Addendum)
[place], [place], [address] Phone: [phone]   Fax:  [phone]  Physical Therapy Treatment  Patient Details  Name: [name] MRN: [id number] Date of Birth: 1986-02-01 Referring Provider (PT): Sanjuana Kava, MD   Encounter Date: 06/05/2019  PT End of Session - 06/05/19 1459    Visit Number  2    Number of Visits  8    Date for PT Re-Evaluation  06/30/19    Authorization Type  Medicaid (3 visits requested, check approval)    Authorization Time Period  06/02/19 - 07/03/19    Authorization - Visit Number  1    Authorization - Number of Visits  3    PT Start Time  [id number]    PT Stop Time  1520    PT Time Calculation (min)  43 min    Activity Tolerance  Patient tolerated treatment well    Behavior During Therapy  [facility] for tasks assessed/performed       Past Medical History:  Diagnosis Date  . Anxiety   . Asthma   . Asthma 11/10/2013  . Bipolar 1 disorder ([place])   . Depression   . Diabetic gastroparesis ([place])   . Diabetic neuropathy, type I diabetes mellitus ([place])   . Gastroparesis   . Headache(784.0)   . Hypertension   . Kidney stones   . Polyneuropathy in diabetes(357.2)   . 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[place])   . Tachycardia    baseline tachycardia   . 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 ANAL RECTAL MANOMETRY N/A 03/25/2018   Procedure: ANO RECTAL MANOMETRY;  Surgeon: Doran Stabler, MD;  Location: [facility];  Service: Gastroenterology;  Laterality: N/A;  . CESAREAN SECTION     x 2  . CHOLECYSTECTOMY    . EYE SURGERY    . REFRACTIVE SURGERY Bilateral     There were no vitals filed for this visit.  Subjective Assessment - 06/05/19 1439    Subjective  Patient reported hip pain of a 2/10. Stated she has tried to do some exercises at home. 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    Limitations  Standing;Sitting;House hold activities    How long can you sit comfortably?  1 minute    How long can you stand comfortably?  10 minutes    How long can you walk comfortably?  10 minutes    Diagnostic tests  X-ray left knee and hip 02/13/19: negative    Patient Stated Goals  Decrease pain    Currently in Pain?  Yes    Pain Score  2     Pain Location  Hip    Pain Orientation  Left    Pain Descriptors / Indicators  Sharp;Burning    Pain Onset  More than a month ago         [facility] PT Assessment - 06/05/19 0001      Ambulation/Gait   Ambulation/Gait  Yes    Ambulation Distance (Feet)  430 Feet    Assistive device  None    Gait Pattern  Within Functional Limits    Ambulation Surface  Level;Indoor    Gait velocity  1.09 m/s   2MWT   Gait Comments  Patient reported onset of pain after 20 seconds  [facility] PT Treatment/Exercise - 06/05/19 0001      Exercises   Exercises  Knee/Hip      Knee/Hip Exercises: Supine   Other Supine Knee/Hip Exercises  Supine abduction 10x10'' with strap, Hip adduction 10x10'' with ball      Manual Therapy   Manual Therapy  Soft tissue mobilization    Manual therapy comments  All manual completed from other skilled interventions    Soft tissue mobilization  Patient in right sidelying. 2 pillows between patient's ankle and knee to improve alignment. IASTM using small green weighted ball to left IT band and gluteals to patient's comfort.                PT Short Term Goals - 06/05/19 1440      PT SHORT TERM GOAL #1   Title  Patient will report understanding and regular compliance with HEP to improve strength, flexibility, and decrease pain.    Time  2    Period  Weeks    Status  On-going    Target Date  06/16/19      PT SHORT TERM GOAL #2   Title  Patient will report that her hip pain has not exceeded a 7/10 over the course of a 1 week period indicating  improved tolerance to daily activities.    Time  2    Period  Weeks    Status  On-going    Target Date  06/16/19        PT Long Term Goals - 06/05/19 1441      PT LONG TERM GOAL #1   Title  Patient will report that her hip pain has not exceeded a 3/10 over the course of a 1 week period indicating improved tolerance to daily activities.    Time  4    Period  Weeks    Status  On-going      PT LONG TERM GOAL #2   Title  Patient will demonstrate ability to maintain single limb stance for at least 15 seconds indicating improved stability and balance for safety with activities such as stair negotiation.    Time  4    Period  Weeks    Status  On-going      PT LONG TERM GOAL #3   Title  Patient will demonstrate improvement of at least 9 points on the LEFS indicating improved perceived functional mobility.    Time  4    Period  Weeks    Status  On-going      PT LONG TERM GOAL #4   Title  Patient will deny pain with MMT of left hip extension and abduction indicating decreased irritability for improved ability to ambulate without pain.    Time  4    Period  Weeks    Status  On-going      PT LONG TERM GOAL #5   Title  Patient will report ability to sit for at least 10 minutes before onset of pain in order to progress to being able to sit and eat a meal with decreased pain.    Time  4    Period  Weeks    Status  On-going      PT LONG TERM GOAL #6   Title  Patient will report ability to ambulate for at least 25 minutes with no more than 2/10 pain in order to be able to grocery shop with decreased pain.    Time  4    Period  Weeks    Status  On-going            Plan - 06/05/19 1529    Clinical Impression Statement  Began by reviewing patient's evaluation, goals, and initial HEP. Then had patient perform and demonstrate HEP with minimal cueing required. Then performed 2MWT with patient reporting onset of pain after 20 seconds. Ended session with instrument-assisted soft tissue  mobilization to improve patient's tissue extensibility and decrease pain. Patient would benefit from continued skilled physical therapy in order to continue progressing towards functional goals.    Personal Factors and Comorbidities  Time since onset of injury/illness/exacerbation;Comorbidity 3+    Comorbidities  HTN, Type I DM, HTN, Asthma, Depression, Bipolar Disorder    Examination-Activity Limitations  Squat;Stairs;Bend;Stand;Sit    Examination-Participation Restrictions  Shop;Community Activity;Meal Prep;Driving    Stability/Clinical Decision Making  Stable/Uncomplicated    Rehab Potential  Good    PT Frequency  2x / week    PT Duration  4 weeks    PT Treatment/Interventions  ADLs/Self Care Home Management;Electrical Stimulation;DME Instruction;Gait training;Stair training;Functional mobility training;Therapeutic activities;Therapeutic exercise;Balance training;Patient/family education;Orthotic Fit/Training;Manual techniques;Passive range of motion;Energy conservation;Dry needling;Taping    PT Next Visit Plan  F/U on alignment when sleeping using pillows. Continue Manual therapy to IT band and gluteals.  progress to isotonic strengthening as able. IT band stretch, gluteal stretch, quadriceps stretch. Hip distraction as needed.    PT Home Exercise Plan  06/02/19: isometric hip ADD/ABD 10x10'' 1x/day; 06/05/19: Trial pillows while sleeping for improved alignment    Consulted and Agree with Plan of Care  Patient       Patient will benefit from skilled therapeutic intervention in order to improve the following deficits and impairments:  Pain, Decreased mobility, Decreased activity tolerance, Decreased endurance, Decreased strength, Decreased balance, Difficulty walking, Impaired flexibility  Visit Diagnosis: Pain in left hip  Muscle weakness (generalized)  Other symptoms and signs involving the musculoskeletal system     Problem List Patient Active Problem List   Diagnosis Date Noted  .  Pseudophakia of left eye 11/04/2018  . Constipation   . CKD (chronic kidney disease), stage III ([place]) 01/01/2017  . Diabetic nephropathy associated with type 1 diabetes mellitus ([place]) 03/01/2016  . Bipolar disorder in partial remission ([place]) 02/01/2016  . Chronic hypertension 02/01/2016  . DM retinopathy ([place])   . Encounter for preconception consultation   . After-cataract obscuring vision, left 09/22/2014  . MDD (major depressive disorder), severe ([place]) 05/31/2014  . Drug overdose, intentional ([place]) 03/27/2014  . Poisoning by drug or medicinal substance 03/27/2014  . Asthma 11/10/2013  . Colles' fracture of left radius 10/11/2013  . Type I diabetes mellitus with complication, uncontrolled ([place]) 10/11/2013  . MDD (major depressive disorder), recurrent episode, severe ([place]) 09/26/2013  . Generalized anxiety disorder 09/26/2013  . Aphakia, right eye 09/14/2012  . Retinal detachment, tractional, left eye 06/11/2012  . Anemia 06/07/2012  . Eczema 06/07/2012  . Renal calculi 06/07/2012  . Hyperglycemia 04/30/2012  . Diabetic retinopathy associated with type 1 diabetes mellitus ([place]) 04/30/2012  . Polyneuropathy in diabetes(357.2) 04/30/2012  . H/O insertion of insulin pump 04/30/2012  . Polyneuropathy in diabetes ([place]) 04/30/2012  . CHOLELITHIASIS 08/10/2010  . ATRIAL TACHYCARDIA 08/08/2010  . Gastroparesis 08/08/2010  . LIVER FUNCTION TESTS, ABNORMAL, HX OF 08/08/2010  . Type 1 diabetes mellitus with ketoacidosis, uncontrolled ([place]) 08/02/2010  . BOWEL INTUSSUSCEPTION 08/02/2010  . PANCREATITIS, ACUTE, HX OF 08/02/2010  . ABDOMINAL PAIN, LEFT UPPER QUADRANT 08/01/2010   Clarene Critchley PT,  DPT 3:34 PM, 06/05/19 [place] [address] [place], [place], [address] Phone: [phone]   Fax:  [phone]  Name: [name] MRN: [id number] Date of Birth: 1986/09/14

## 2019-06-05 NOTE — Progress Notes (Signed)
[name] [id number] Jul 27, 1986 33 y.o.  Subjective:   Patient ID:  [name] is a 32 y.o. (DOB 06-12-1986) female.  Chief Complaint:  Chief Complaint  Patient presents with  . Follow-up    Medication Management  . Depression    Medication Management  . Other    Bipolar  . Anxiety    HPI [name] presents to the office today for follow-up of bipolar disorder and generalized anxiety disorder.  Last seen April 09, 2019.  The following change was made.for bipolar depression Start pramipexole 0.25 mg one half twice daily for 1 day, then 1 twice daily for 1 week. If no improvement increase to 1-1/2 tablets twice daily.  She had been very stressed about a disability review that was held on July 31.  She called the next day complaining of a lot of anxiety and crying all the time.  She did not feel she could increase gabapentin because a history of tiredness from it at 600 3 times daily.  Still dealing with a lot of depression as well and financial stress.  She had in the past taking carbamazepine 400 mg twice daily and so the decision was made to increase carbamazepine to 400 mg in the morning, 200 mg at noon, 200 mg at night.  Pretty good but some panic attacks but less severe. Pramipexole helps mood a lot in the first time of the day but worse in the pm around 3pm.  She switched it all to HS bc was having nausea with the am dosage. In the morning is happier.  Really moody in the afternoon and less joyful. Never followed this pattern before the med.  Was bad all day before.  H noticed she looked mad before the meds.  Tends to have nausea anyway bc of gastroparesis from DM.  More trouble going to sleep but usually trazodone helps.  Caffeine with dinner.    No SE with CBZ increase and ? A little tired mid day with it.  Anxiety with chest tightness, shakey, +/- panic with panic lasting an hour if takes gabapentin to help. Panic mostly am and until 6 pm.  Both  triggered and spontaneous.  Now 1-2 weekly.  Hydroxyzine doesn't help panic.  Less irritable with increase Equetro.  More tired in the pm by 9 pm. Sleep about 8 hours.  Awaken often.  Gets out of bed.  No reason.  Custody battle with exH.  He's nicer to her now.  B diabetic died last year with DM and kidney failure.  Weight going up. Hates weight gain effects of meds.    Consistent with meds.  No sig SI other than fleeting rarely.  Scattered concentration and racing thoughts randomly.   Sleep OK.  Remarried April. 34 yo D ADD is stressful and disrespectful.  Past Psychiatric Medication Trials: Latuda 120, lamotrigine 300 twice daily, Equetro 900 daily,  brief Vraylar, lithium felt slowed down, Seroquel,Abilify 15 sertraline, duloxetine,  gabapentin 600 mg 3 times daily tiredness,  buspirone 30 twice daily,, clonazepam, trazodone, Xanax , hydroxyzine NR,  History of suicidal ideation and about 5 suicide attempts with about 4 psychiatric hospitalizations  Review of Systems:  Review of Systems  Gastrointestinal: Positive for nausea.  Musculoskeletal: Positive for arthralgias.  Neurological: Negative for tremors and weakness.  Psychiatric/Behavioral: Positive for sleep disturbance.    Medications: I have reviewed the patient's current medications.  Current Outpatient Medications  Medication Sig Dispense Refill  . acetaminophen (TYLENOL) 500 MG  tablet Take 500 mg by mouth daily as needed for mild pain.    Marland Kitchen albuterol (PROAIR HFA) 108 (90 Base) MCG/ACT inhaler INHALE 2 PUFFS BY MOUTH EVERY 6 HOURS AS NEEDED FOR SHORTNESS OF BREATH 18 g 3  . albuterol (PROVENTIL) (2.5 MG/3ML) 0.083% nebulizer solution Take 3 mLs (2.5 mg total) by nebulization every 6 (six) hours as needed for wheezing or shortness of breath. 150 mL 1  . AMBULATORY NON FORMULARY MEDICATION DOMPERIDONE 10 mg, twice a day. One with breakfast One with supper Dispense 60 tablets  2 refills 60 tablet 2  . amLODipine (NORVASC) 2.5  MG tablet Take 2.5 mg by mouth daily.    . busPIRone (BUSPAR) 15 MG tablet TAKE 2 TABLETS (30 MG TOTAL) BY MOUTH 2 (TWO) TIMES DAILY. 120 tablet 1  . carbamazepine (EQUETRO) 200 MG CP12 12 hr capsule TAKE 2 CAPSULES EVERY MORNING AND TAKE 1 CAPSULE AT BEDTIME 90 capsule 1  . ciclopirox (PENLAC) 8 % solution Apply one coat to each toenail daily. Remove weekly with polish remover. 6.6 mL 11  . enalapril (VASOTEC) 10 MG tablet Take 10 mg by mouth daily.    [name] (AIMOVIG) 70 MG/ML SOAJ Inject 70 mg into the skin every 30 (thirty) days. 1 pen 3  . etonogestrel (NEXPLANON) 68 MG IMPL implant 1 each by Subdermal route once.    . fluticasone (FLONASE) 50 MCG/ACT nasal spray Place 2 sprays into both nostrils daily. 16 g 2  . furosemide (LASIX) 40 MG tablet Take 40 mg by mouth. Every other day    . gabapentin (NEURONTIN) 300 MG capsule Take 1 capsule (300 mg total) by mouth 3 (three) times daily. (Patient taking differently: Take 300 mg by mouth 3 (three) times daily as needed. ) 90 capsule 3  . GLUCAGON EMERGENCY 1 MG injection AS DIRECTED AS NEEDED FOR HYPOGLYCEMIA INJECTION 30 DAYS    . HYDROcodone-acetaminophen (NORCO) 5-325 MG tablet Take 1 tablet by mouth every 6 (six) hours as needed for moderate pain. 30 tablet 0  . Insulin Human (INSULIN PUMP) SOLN Inject 1 each into the skin 3 times daily with meals, bedtime and 2 AM. Novolog/36-38 units/day basal; bolus sliding scale/adjusted per sliding scale by patient    . LATUDA 120 MG TABS TAKE 1 TABLET BY MOUTH EVERY DAY 30 tablet 2  . linaclotide (LINZESS) 145 MCG CAPS capsule Take 1 capsule (145 mcg total) by mouth daily before breakfast. 30 capsule 3  . mometasone-formoterol (DULERA) 100-5 MCG/ACT AERO Inhale 2 puffs into the lungs 2 (two) times daily. 1 Inhaler 5  . montelukast (SINGULAIR) 10 MG tablet Take 1 tablet (10 mg total) by mouth at bedtime. 90 tablet 3  . NOVOLOG 100 UNIT/ML injection Inject 36-150 Units into the skin. Via insulin  pump per sliding scale    . promethazine (PHENERGAN) 25 MG tablet Take 0.5 tablets (12.5 mg total) by mouth every 8 (eight) hours as needed for nausea or vomiting. 30 tablet 1  . SUMAtriptan (IMITREX) 100 MG tablet Take 1 tablet (100 mg total) by mouth every 2 (two) hours as needed for migraine. May repeat in 2 hours if headache persists or recurs. 10 tablet 0  . traZODone (DESYREL) 50 MG tablet Take 1 tablet (50 mg total) by mouth at bedtime. 30 tablet 2  . Urea 40 % LOTN Apply 1 application topically daily. Apply only to calluses 325 mL 0  . lamoTRIgine (LAMICTAL) 150 MG tablet Take 2 tablets (300 mg total) by mouth  2 (two) times daily. 120 tablet 1  . Pramipexole Dihydrochloride 0.75 MG TB24 Take 1 tablet (0.75 mg total) by mouth every evening. 30 tablet 1   No current facility-administered medications for this visit.     Medication Side Effects: tired in the afternoon.  Allergies:  Allergies  Allergen Reactions  . Toradol [Ketorolac Tromethamine]     Cannot take d/t kidney issues   . Ceftin Rash    Past Medical History:  Diagnosis Date  . Anxiety   . Asthma   . Asthma 11/10/2013  . Bipolar 1 disorder ([place])   . Depression   . Diabetic gastroparesis ([place])   . Diabetic neuropathy, type I diabetes mellitus ([place])   . Gastroparesis   . Headache(784.0)   . Hypertension   . Kidney stones   . Polyneuropathy in diabetes(357.2)   . 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 Tachycardia    baseline tachycardia   . 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 Hypertension Other   . Diabetes Mother        type 2  . Diabetes Brother        type 1  . Renal Disease Brother        renal failure  . Hypertension Brother   . Colon cancer Neg Hx   . Rectal cancer Neg Hx   . Esophageal cancer Neg Hx     Social History   Socioeconomic History  . Marital status: Legally Separated    Spouse  name: Not on file  . Number of children: 2  . Years of education: Not on file  . Highest education level: Not on file  Occupational History  . Occupation: Materials engineer  Social Needs  . Financial resource strain: Not on file  . Food insecurity    Worry: Not on file    Inability: Not on file  . Transportation needs    Medical: Not on file    Non-medical: Not on file  Tobacco Use  . Smoking status: Never Smoker  . Smokeless tobacco: Never Used  Substance and Sexual Activity  . Alcohol use: No  . Drug use: No  . Sexual activity: Yes    Partners: Male  Lifestyle  . Physical activity    Days per week: Not on file    Minutes per session: Not on file  . Stress: Not on file  Relationships  . Social Herbalist on phone: Not on file    Gets together: Not on file    Attends religious service: Not on file    Active member of club or organization: Not on file    Attends meetings of clubs or organizations: Not on file    Relationship status: Not on file  . Intimate partner violence    Fear of current or ex partner: Not on file    Emotionally abused: Not on file    Physically abused: Not on file    Forced sexual activity: Not on file  Other Topics Concern  . Not on file  Social History Narrative   Pt has a daughter and boyfriend.           Past Medical History, Surgical history, Social history, and Family history were reviewed and updated as appropriate.   Please see review of systems for further details on the patient's review from today.  Objective:   Physical Exam:  There were no vitals taken for this visit.  Physical Exam Constitutional:      General: She is not in acute distress.    Appearance: She is well-developed.  Musculoskeletal:        General: No deformity.  Neurological:     Mental Status: She is alert and oriented to person, place, and time.     Coordination: Coordination normal.  Psychiatric:        Attention and Perception: She is attentive.  She does not perceive auditory hallucinations.        Mood and Affect: Mood is anxious and depressed. Affect is tearful. Affect is not labile, blunt, angry or inappropriate.        Speech: Speech normal.        Behavior: Behavior normal.        Thought Content: Thought content normal. Thought content does not include homicidal or suicidal ideation. Thought content does not include homicidal or suicidal plan.        Cognition and Memory: Cognition normal.        Judgment: Judgment normal.     Comments: Insight intact. No auditory or visual hallucinations. No delusions.      Lab Review:     Component Value Date/Time   NA 135 10/30/2018 1027   K 4.9 10/30/2018 1027   CL 105 10/30/2018 1027   CO2 24 10/30/2018 1027   GLUCOSE 284 (H) 10/30/2018 1027   BUN 23 (H) 10/30/2018 1027   CREATININE 1.33 (H) 10/30/2018 1027   CREATININE 1.81 (H) 06/17/2018 1231   CALCIUM 8.7 (L) 10/30/2018 1027   PROT 5.8 (L) 06/17/2018 1231   ALBUMIN 3.6 09/29/2017 0451   AST 17 06/17/2018 1231   ALT 19 06/17/2018 1231   ALKPHOS 107 09/29/2017 0451   BILITOT 0.3 06/17/2018 1231   GFRNONAA 52 (L) 10/30/2018 1027   GFRNONAA 36 (L) 06/17/2018 1231   GFRAA >60 10/30/2018 1027   GFRAA 42 (L) 06/17/2018 1231       Component Value Date/Time   WBC 6.8 10/30/2018 1027   RBC 4.05 10/30/2018 1027   HGB 11.9 (L) 10/30/2018 1027   HCT 37.4 10/30/2018 1027   PLT 276 10/30/2018 1027   MCV 92.3 10/30/2018 1027   MCH 29.4 10/30/2018 1027   MCHC 31.8 10/30/2018 1027   RDW 12.3 10/30/2018 1027   LYMPHSABS 1.5 10/30/2018 1027   MONOABS 0.4 10/30/2018 1027   EOSABS 0.3 10/30/2018 1027   BASOSABS 0.1 10/30/2018 1027    No results found for: POCLITH, LITHIUM   No results found for: PHENYTOIN, PHENOBARB, VALPROATE, CBMZ   .res Assessment: Plan:    Haizley was seen today for follow-up, depression, other and anxiety.  Diagnoses and all orders for this visit:  Bipolar I disorder with depression, severe  (HCC) -     Pramipexole Dihydrochloride 0.75 MG TB24; Take 1 tablet (0.75 mg total) by mouth every evening. -     lamoTRIgine (LAMICTAL) 150 MG tablet; Take 2 tablets (300 mg total) by mouth 2 (two) times daily.  Generalized anxiety disorder  Panic disorder with agoraphobia     Patient with a history of multiple med failures recent worsening of bipolar depression.  Had been doing relatively well on this combination of medication.  Discern about potential weight gain with medications.  She is taking the major mood stabilizers with low weight gain potential.  Therefore I am hesitant to switch major mood stabilizers.  Discussed the  pros and cons of switching mood stabilizers.  Could consider ultra low-dose lithium.   Pramipexole has helped her depression in the morning but it wears off in the afternoon.  She is only taking it at night because of nausea. Instead will potentiate with pramipexole off label for bipolar depression Change pramipexole ER and increase to 0.75 mg pm to rid nausea and improve mood in the afternoon.   Discussed the risk of triggering mania and to stop it if it does so and contact the office.  Discussed side effects of the medication.  This option has the advantage that it could potentially be discontinued in the future if no longer needed.  For tiredness in the afternoon switch Equetro to 400 mg BiD  For panic in daytime start gabapentin 300 mg each am as preventative.  She's only taking it prn now.  Continue Latuda 120 mg and lamotrigine 300 mg twice daily for bipolar disorder.  Discussed the necessary polypharmacy at this time to manage her bipolar symptoms and anxiety.  Discussed drug to drug interaction issues between little to the and carbamazepine.  Carbamazepine can affect blood levels of multiple other medications but at this time it appears unavoidable.  She is still getting benefit out of the various medication she is taking.  Discussed potential metabolic side  effects associated with atypical antipsychotics, as well as potential risk for movement side effects. Advised pt to contact office if movement side effects occur.   Counseled patient regarding potential benefits, risks, and side effects of Lamictal and carbamazepine to include potential risk of Stevens-Johnson syndrome. Advised patient to stop taking Lamictal and contact office immediately if rash develops and to seek urgent medical attention if rash is severe and/or spreading quickly.  No caffeine after 3 pm.   This was a 40-minute appointment  Follow-up 6 weeks as soon as available.  [name], MD, DFAPA   Please see After Visit Summary for patient specific instructions.  Future Appointments  Date Time Provider [place]  06/05/2019  2:30 PM Clarene Critchley, PT [facility] None  06/11/2019  2:30 PM Clarene Critchley, PT [facility] None  06/13/2019  3:15 PM Clarene Critchley, PT [facility] None  06/18/2019  2:30 PM Clarene Critchley, PT [facility] None  06/20/2019  2:30 PM Clarene Critchley, PT [facility] None  06/24/2019 10:10 AM Sanjuana Kava, MD [facility] None  06/24/2019  2:30 PM Clarene Critchley, PT [facility] None  06/26/2019  4:00 PM Clarene Critchley, PT [facility] None  07/22/2019  3:00 PM Cottle, Billey Co., MD [facility] None  09/02/2019  1:45 PM Trula Slade, DPM [facility]    No orders of the defined types were placed in this encounter.   -------------------------------

## 2019-06-11 ENCOUNTER — Ambulatory Visit (HOSPITAL_COMMUNITY): Payer: Medicare Other | Admitting: Physical Therapy

## 2019-06-11 ENCOUNTER — Telehealth (HOSPITAL_COMMUNITY): Payer: Self-pay | Admitting: Physical Therapy

## 2019-06-11 NOTE — Telephone Encounter (Signed)
She is not feeling well and will not be here today- she will see [name] on Friday

## 2019-06-13 ENCOUNTER — Telehealth (HOSPITAL_COMMUNITY): Payer: Self-pay | Admitting: Physical Therapy

## 2019-06-13 ENCOUNTER — Ambulatory Visit (HOSPITAL_COMMUNITY): Payer: Medicare Other | Attending: Orthopaedic Surgery | Admitting: Physical Therapy

## 2019-06-13 DIAGNOSIS — M25552 Pain in left hip: Secondary | ICD-10-CM | POA: Insufficient documentation

## 2019-06-13 DIAGNOSIS — R29898 Other symptoms and signs involving the musculoskeletal system: Secondary | ICD-10-CM | POA: Insufficient documentation

## 2019-06-13 DIAGNOSIS — M6281 Muscle weakness (generalized): Secondary | ICD-10-CM | POA: Insufficient documentation

## 2019-06-13 NOTE — Telephone Encounter (Signed)
Called regarding patient not showing for today's scheduled appointment. There was no answer so left a  Message reminding patient of next scheduled appointment and reminding her to call if she is unable to make that appointment.  Clarene Critchley PT, DPT 4:23 PM, 06/13/19 [phone]

## 2019-06-18 ENCOUNTER — Telehealth (HOSPITAL_COMMUNITY): Payer: Self-pay | Admitting: Family Medicine

## 2019-06-18 ENCOUNTER — Telehealth: Payer: Self-pay | Admitting: Family Medicine

## 2019-06-18 ENCOUNTER — Ambulatory Visit (HOSPITAL_COMMUNITY): Payer: Medicare Other | Admitting: Physical Therapy

## 2019-06-18 DIAGNOSIS — G43809 Other migraine, not intractable, without status migrainosus: Secondary | ICD-10-CM

## 2019-06-18 NOTE — Telephone Encounter (Signed)
[phone] Patient is calling to talk about migranes and medication and wrist pain

## 2019-06-18 NOTE — Telephone Encounter (Signed)
06/18/19  I called and spoke to patient and cx today's appt because we didn't have approval from Medicaid.  I told her that for her appt on 9/11 I would call either way about the approval for visits

## 2019-06-19 ENCOUNTER — Telehealth (HOSPITAL_COMMUNITY): Payer: Self-pay | Admitting: Family Medicine

## 2019-06-19 NOTE — Telephone Encounter (Signed)
9/10 I called and left message to let patient know we had the approval back and to come to her appt on 9/11

## 2019-06-19 NOTE — Telephone Encounter (Signed)
Spoke to pt and she states that the Amovig is no better then Topamax. And you had mentioned another medication that she would like to try. (She made apt for the wrist pain)

## 2019-06-20 ENCOUNTER — Encounter (HOSPITAL_COMMUNITY): Payer: Self-pay | Admitting: Physical Therapy

## 2019-06-20 ENCOUNTER — Ambulatory Visit (HOSPITAL_COMMUNITY): Payer: Medicare Other | Admitting: Physical Therapy

## 2019-06-20 ENCOUNTER — Other Ambulatory Visit: Payer: Self-pay

## 2019-06-20 DIAGNOSIS — M25552 Pain in left hip: Secondary | ICD-10-CM

## 2019-06-20 DIAGNOSIS — R29898 Other symptoms and signs involving the musculoskeletal system: Secondary | ICD-10-CM | POA: Diagnosis not present

## 2019-06-20 DIAGNOSIS — M6281 Muscle weakness (generalized): Secondary | ICD-10-CM | POA: Diagnosis not present

## 2019-06-20 NOTE — Telephone Encounter (Signed)
Patient aware of providers recommendations - referral placed.

## 2019-06-20 NOTE — Telephone Encounter (Signed)
Recommend neurology appointment.

## 2019-06-20 NOTE — Therapy (Signed)
[place], [place], [address] Phone: [phone]   Fax:  [phone]  Physical Therapy Treatment  Patient Details  Name: [name] MRN: [id number] Date of Birth: 1986/07/22 Referring Provider (PT): Sanjuana Kava, MD   Encounter Date: 06/20/2019  PT End of Session - 06/20/19 1447    Visit Number  3    Number of Visits  8    Date for PT Re-Evaluation  06/30/19    Authorization Type  Medicaid : 6 visits approved 06/18/19 - 07/08/19    Authorization Time Period  06/02/19 - 07/03/19    Authorization - Visit Number  1    Authorization - Number of Visits  6    PT Start Time  [id number]    PT Stop Time  1522    PT Time Calculation (min)  40 min    Activity Tolerance  Patient tolerated treatment well    Behavior During Therapy  [facility] for tasks assessed/performed       Past Medical History:  Diagnosis Date  . Anxiety   . Asthma   . Asthma 11/10/2013  . Bipolar 1 disorder ([place])   . Depression   . Diabetic gastroparesis ([place])   . Diabetic neuropathy, type I diabetes mellitus ([place])   . Gastroparesis   . Headache(784.0)   . Hypertension   . Kidney stones   . Polyneuropathy in diabetes(357.2)   . 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[place])   . Tachycardia    baseline tachycardia   . 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 ANAL RECTAL MANOMETRY N/A 03/25/2018   Procedure: ANO RECTAL MANOMETRY;  Surgeon: Doran Stabler, MD;  Location: [facility];  Service: Gastroenterology;  Laterality: N/A;  . CESAREAN SECTION     x 2  . CHOLECYSTECTOMY    . EYE SURGERY    . REFRACTIVE SURGERY Bilateral     There were no vitals filed for this visit.  Subjective Assessment - 06/20/19 1445    Subjective  Patient denied pain this session currently. She stated she's been doing her HEP.    Limitations  Standing;Sitting;House hold  activities    How long can you sit comfortably?  1 minute    How long can you stand comfortably?  10 minutes    How long can you walk comfortably?  10 minutes    Diagnostic tests  X-ray left knee and hip 02/13/19: negative    Patient Stated Goals  Decrease pain    Currently in Pain?  No/denies                       [facility] Adult PT Treatment/Exercise - 06/20/19 0001      Knee/Hip Exercises: Stretches   Other Knee/Hip Stretches  Supine IT band stretch with strap x 10      Knee/Hip Exercises: Supine   Other Supine Knee/Hip Exercises  Supine isometric hip abduction 10x10'' with strap, Hip adduction 10x10'' with ball    Other Supine Knee/Hip Exercises  Ab set 5'' hold x 10, Ab set with march x 20 alternating legs 3-5 second hold to decrease stress on hips. LTR to improve hip mobility and decrease pain      Manual Therapy   Manual Therapy  Soft tissue mobilization    Manual therapy comments  All manual completed from other skilled interventions  Soft tissue mobilization  Patient in right sidelying. 2 pillows between patient's ankle and knee to improve alignment. IASTM using small green weighted ball to left IT band and gluteals to patient's comfort.              PT Education - 06/20/19 1445    Education Details  Updated HEP and purpose and technique of interventions.    Person(s) Educated  Patient    Methods  Explanation    Comprehension  Verbalized understanding       PT Short Term Goals - 06/05/19 1440      PT SHORT TERM GOAL #1   Title  Patient will report understanding and regular compliance with HEP to improve strength, flexibility, and decrease pain.    Time  2    Period  Weeks    Status  On-going    Target Date  06/16/19      PT SHORT TERM GOAL #2   Title  Patient will report that her hip pain has not exceeded a 7/10 over the course of a 1 week period indicating improved tolerance to daily activities.    Time  2    Period  Weeks    Status  On-going     Target Date  06/16/19        PT Long Term Goals - 06/05/19 1441      PT LONG TERM GOAL #1   Title  Patient will report that her hip pain has not exceeded a 3/10 over the course of a 1 week period indicating improved tolerance to daily activities.    Time  4    Period  Weeks    Status  On-going      PT LONG TERM GOAL #2   Title  Patient will demonstrate ability to maintain single limb stance for at least 15 seconds indicating improved stability and balance for safety with activities such as stair negotiation.    Time  4    Period  Weeks    Status  On-going      PT LONG TERM GOAL #3   Title  Patient will demonstrate improvement of at least 9 points on the LEFS indicating improved perceived functional mobility.    Time  4    Period  Weeks    Status  On-going      PT LONG TERM GOAL #4   Title  Patient will deny pain with MMT of left hip extension and abduction indicating decreased irritability for improved ability to ambulate without pain.    Time  4    Period  Weeks    Status  On-going      PT LONG TERM GOAL #5   Title  Patient will report ability to sit for at least 10 minutes before onset of pain in order to progress to being able to sit and eat a meal with decreased pain.    Time  4    Period  Weeks    Status  On-going      PT LONG TERM GOAL #6   Title  Patient will report ability to ambulate for at least 25 minutes with no more than 2/10 pain in order to be able to grocery shop with decreased pain.    Time  4    Period  Weeks    Status  On-going            Plan - 06/20/19 1552    Clinical Impression Statement  This session  progressed stretching and strengthening exercises. Added IT band stretch this session to decreased tightness and improve mobility through the lower extremity. Also added core strengthening exercises including abdominal sets and ab sets with bent knee raises/marching. Added core strengthening exercises to patient's HEP. Patient would benefit  from continued skilled physical therapy in order to continue progressing towards functional goals.    Personal Factors and Comorbidities  Time since onset of injury/illness/exacerbation;Comorbidity 3+    Comorbidities  HTN, Type I DM, HTN, Asthma, Depression, Bipolar Disorder    Examination-Activity Limitations  Squat;Stairs;Bend;Stand;Sit    Examination-Participation Restrictions  Shop;Community Activity;Meal Prep;Driving    Stability/Clinical Decision Making  Stable/Uncomplicated    Rehab Potential  Good    PT Frequency  2x / week    PT Duration  4 weeks    PT Treatment/Interventions  ADLs/Self Care Home Management;Electrical Stimulation;DME Instruction;Gait training;Stair training;Functional mobility training;Therapeutic activities;Therapeutic exercise;Balance training;Patient/family education;Orthotic Fit/Training;Manual techniques;Passive range of motion;Energy conservation;Dry needling;Taping    PT Next Visit Plan  Continue to F/U on alignment when sleeping using pillows. Continue Manual therapy to IT band and gluteals.  Add gluteal stretch, quadriceps stretch next session. Consider Hip distraction as needed.    PT Home Exercise Plan  06/02/19: isometric hip ADD/ABD 10x10'' 1x/day; 06/05/19: Trial pillows while sleeping for improved alignment; 06/20/19: Consider ab set and supine marching with ab set 1x/day    Consulted and Agree with Plan of Care  Patient       Patient will benefit from skilled therapeutic intervention in order to improve the following deficits and impairments:  Pain, Decreased mobility, Decreased activity tolerance, Decreased endurance, Decreased strength, Decreased balance, Difficulty walking, Impaired flexibility  Visit Diagnosis: Pain in left hip  Muscle weakness (generalized)  Other symptoms and signs involving the musculoskeletal system     Problem List Patient Active Problem List   Diagnosis Date Noted  . Pseudophakia of left eye 11/04/2018  . Constipation    . CKD (chronic kidney disease), stage III ([place]) 01/01/2017  . Diabetic nephropathy associated with type 1 diabetes mellitus ([place]) 03/01/2016  . Bipolar disorder in partial remission ([place]) 02/01/2016  . Chronic hypertension 02/01/2016  . DM retinopathy ([place])   . Encounter for preconception consultation   . After-cataract obscuring vision, left 09/22/2014  . MDD (major depressive disorder), severe ([place]) 05/31/2014  . Drug overdose, intentional ([place]) 03/27/2014  . Poisoning by drug or medicinal substance 03/27/2014  . Asthma 11/10/2013  . Colles' fracture of left radius 10/11/2013  . Type I diabetes mellitus with complication, uncontrolled ([place]) 10/11/2013  . MDD (major depressive disorder), recurrent episode, severe ([place]) 09/26/2013  . Generalized anxiety disorder 09/26/2013  . Aphakia, right eye 09/14/2012  . Retinal detachment, tractional, left eye 06/11/2012  . Anemia 06/07/2012  . Eczema 06/07/2012  . Renal calculi 06/07/2012  . Hyperglycemia 04/30/2012  . Diabetic retinopathy associated with type 1 diabetes mellitus ([place]) 04/30/2012  . Polyneuropathy in diabetes(357.2) 04/30/2012  . H/O insertion of insulin pump 04/30/2012  . Polyneuropathy in diabetes ([place]) 04/30/2012  . CHOLELITHIASIS 08/10/2010  . ATRIAL TACHYCARDIA 08/08/2010  . Gastroparesis 08/08/2010  . LIVER FUNCTION TESTS, ABNORMAL, HX OF 08/08/2010  . Type 1 diabetes mellitus with ketoacidosis, uncontrolled ([place]) 08/02/2010  . BOWEL INTUSSUSCEPTION 08/02/2010  . PANCREATITIS, ACUTE, HX OF 08/02/2010  . ABDOMINAL PAIN, LEFT UPPER QUADRANT 08/01/2010   [name] PT, DPT 3:54 PM, 06/20/19 [place], [place], [address] Phone: [phone]   Fax:  [phone]  Name: [name] MRN: [id number] Date of Birth: 1985/11/12

## 2019-06-20 NOTE — Patient Instructions (Signed)
[place] Access code: [email or web address]

## 2019-06-24 ENCOUNTER — Ambulatory Visit: Payer: Medicaid Other | Admitting: Orthopaedic Surgery

## 2019-06-24 ENCOUNTER — Encounter: Payer: Self-pay | Admitting: Orthopaedic Surgery

## 2019-06-24 ENCOUNTER — Ambulatory Visit: Payer: Medicaid Other | Admitting: Family Medicine

## 2019-06-24 ENCOUNTER — Other Ambulatory Visit: Payer: Self-pay

## 2019-06-24 ENCOUNTER — Encounter (HOSPITAL_COMMUNITY): Payer: Self-pay | Admitting: Physical Therapy

## 2019-06-24 ENCOUNTER — Ambulatory Visit (HOSPITAL_COMMUNITY): Payer: Medicare Other | Admitting: Physical Therapy

## 2019-06-24 VITALS — BP 141/83 | HR 91 | Ht 66.0 in | Wt 199.0 lb

## 2019-06-24 DIAGNOSIS — R200 Anesthesia of skin: Secondary | ICD-10-CM

## 2019-06-24 DIAGNOSIS — G5602 Carpal tunnel syndrome, left upper limb: Secondary | ICD-10-CM

## 2019-06-24 DIAGNOSIS — R202 Paresthesia of skin: Secondary | ICD-10-CM | POA: Diagnosis not present

## 2019-06-24 DIAGNOSIS — M25552 Pain in left hip: Secondary | ICD-10-CM | POA: Diagnosis not present

## 2019-06-24 DIAGNOSIS — R29898 Other symptoms and signs involving the musculoskeletal system: Secondary | ICD-10-CM | POA: Diagnosis not present

## 2019-06-24 DIAGNOSIS — M6281 Muscle weakness (generalized): Secondary | ICD-10-CM

## 2019-06-24 NOTE — Therapy (Signed)
[place], [place], [address] Phone: [phone]   Fax:  [phone]  Physical Therapy Treatment  Patient Details  Name: [name] MRN: [id number] Date of Birth: 1986-10-04 Referring Provider (PT): Sanjuana Kava, MD   Encounter Date: 06/24/2019  PT End of Session - 06/24/19 1538    Visit Number  4    Number of Visits  8    Date for PT Re-Evaluation  06/30/19    Authorization Type  Medicaid : 6 visits approved 06/18/19 - 07/08/19    Authorization Time Period  06/02/19 - 07/03/19    Authorization - Visit Number  2    Authorization - Number of Visits  6    PT Start Time  [id number]    PT Stop Time  1516    PT Time Calculation (min)  38 min    Activity Tolerance  Patient tolerated treatment well    Behavior During Therapy  [facility] for tasks assessed/performed       Past Medical History:  Diagnosis Date  . Anxiety   . Asthma   . Asthma 11/10/2013  . Bipolar 1 disorder ([place])   . Depression   . Diabetic gastroparesis ([place])   . Diabetic neuropathy, type I diabetes mellitus ([place])   . Gastroparesis   . Headache(784.0)   . Hypertension   . Kidney stones   . Polyneuropathy in diabetes(357.2)   . 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[place])   . Tachycardia    baseline tachycardia   . 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 ANAL RECTAL MANOMETRY N/A 03/25/2018   Procedure: ANO RECTAL MANOMETRY;  Surgeon: Doran Stabler, MD;  [facility];  Service: Gastroenterology;  Laterality: N/A;  . CESAREAN SECTION     x 2  . CHOLECYSTECTOMY    . EYE SURGERY    . REFRACTIVE SURGERY Bilateral     There were no vitals filed for this visit.  Subjective Assessment - 06/24/19 1450    Subjective  Patient reported that she is going to be out of town next week and reported feeling that therapy has helped and that she feels  like she knows what to continue at home.    Limitations  Standing;Sitting;House hold activities    How long can you sit comfortably?  1 minute    How long can you stand comfortably?  10 minutes    How long can you walk comfortably?  10 minutes    Diagnostic tests  X-ray left knee and hip 02/13/19: negative    Patient Stated Goals  Decrease pain    Currently in Pain?  Yes    Pain Score  2     Pain Location  Hip    Pain Orientation  Left    Pain Descriptors / Indicators  Sharp    Pain Type  Chronic pain                       [facility] Adult PT Treatment/Exercise - 06/24/19 0001      Knee/Hip Exercises: Stretches   Sports administrator  Right;Left;3 reps;30 seconds    Quad Stretch Limitations  with strap    Other Knee/Hip Stretches  Supine IT band stretch with strap x 10      Knee/Hip Exercises: Supine   Other Supine Knee/Hip Exercises  Supine isometric hip  abduction 10x10'' with strap, Hip adduction 10x10'' with ball      Manual Therapy   Manual Therapy  Soft tissue mobilization    Manual therapy comments  All manual completed from other skilled interventions    Soft tissue mobilization  Patient in right sidelying. Pillow between patient's ankle and knee to improve alignment to left IT band and gluteals to patient's comfort.             PT Education - 06/24/19 1538    Education Details  Discussed purpose and technique of interventions.    Person(s) Educated  Patient    Methods  Explanation    Comprehension  Verbalized understanding       PT Short Term Goals - 06/05/19 1440      PT SHORT TERM GOAL #1   Title  Patient will report understanding and regular compliance with HEP to improve strength, flexibility, and decrease pain.    Time  2    Period  Weeks    Status  On-going    Target Date  06/16/19      PT SHORT TERM GOAL #2   Title  Patient will report that her hip pain has not exceeded a 7/10 over the course of a 1 week period indicating improved tolerance to  daily activities.    Time  2    Period  Weeks    Status  On-going    Target Date  06/16/19        PT Long Term Goals - 06/05/19 1441      PT LONG TERM GOAL #1   Title  Patient will report that her hip pain has not exceeded a 3/10 over the course of a 1 week period indicating improved tolerance to daily activities.    Time  4    Period  Weeks    Status  On-going      PT LONG TERM GOAL #2   Title  Patient will demonstrate ability to maintain single limb stance for at least 15 seconds indicating improved stability and balance for safety with activities such as stair negotiation.    Time  4    Period  Weeks    Status  On-going      PT LONG TERM GOAL #3   Title  Patient will demonstrate improvement of at least 9 points on the LEFS indicating improved perceived functional mobility.    Time  4    Period  Weeks    Status  On-going      PT LONG TERM GOAL #4   Title  Patient will deny pain with MMT of left hip extension and abduction indicating decreased irritability for improved ability to ambulate without pain.    Time  4    Period  Weeks    Status  On-going      PT LONG TERM GOAL #5   Title  Patient will report ability to sit for at least 10 minutes before onset of pain in order to progress to being able to sit and eat a meal with decreased pain.    Time  4    Period  Weeks    Status  On-going      PT LONG TERM GOAL #6   Title  Patient will report ability to ambulate for at least 25 minutes with no more than 2/10 pain in order to be able to grocery shop with decreased pain.    Time  4    Period  Weeks    Status  On-going            Plan - 06/24/19 1544    Clinical Impression Statement  Continued with established plan of care this session. Patient reported having had an injection earlier this date. This session added supine hip flexor stretch. Patient reported feeling tightness, but no pain with this stretch. This session ended with manual therapy. Performed soft tissue  mobilization with hands rather than instrument assisted this session. Plan to re-assess patient next session.    Personal Factors and Comorbidities  Time since onset of injury/illness/exacerbation;Comorbidity 3+    Comorbidities  HTN, Type I DM, HTN, Asthma, Depression, Bipolar Disorder    Examination-Activity Limitations  Squat;Stairs;Bend;Stand;Sit    Examination-Participation Restrictions  Shop;Community Activity;Meal Prep;Driving    Stability/Clinical Decision Making  Stable/Uncomplicated    Rehab Potential  Good    PT Frequency  2x / week    PT Duration  4 weeks    PT Treatment/Interventions  ADLs/Self Care Home Management;Electrical Stimulation;DME Instruction;Gait training;Stair training;Functional mobility training;Therapeutic activities;Therapeutic exercise;Balance training;Patient/family education;Orthotic Fit/Training;Manual techniques;Passive range of motion;Energy conservation;Dry needling;Taping    PT Next Visit Plan  Re-assess next session. Continue to F/U on alignment when sleeping using pillows. Continue Manual therapy to IT band and gluteals.  Add gluteal stretch next session.    PT Home Exercise Plan  06/02/19: isometric hip ADD/ABD 10x10'' 1x/day; 06/05/19: Trial pillows while sleeping for improved alignment; 06/20/19: Consider ab set and supine marching with ab set 1x/day    Consulted and Agree with Plan of Care  Patient       Patient will benefit from skilled therapeutic intervention in order to improve the following deficits and impairments:  Pain, Decreased mobility, Decreased activity tolerance, Decreased endurance, Decreased strength, Decreased balance, Difficulty walking, Impaired flexibility  Visit Diagnosis: Pain in left hip  Muscle weakness (generalized)  Other symptoms and signs involving the musculoskeletal system     Problem List Patient Active Problem List   Diagnosis Date Noted  . Pseudophakia of left eye 11/04/2018  . Constipation   . CKD (chronic  kidney disease), stage III ([place]) 01/01/2017  . Diabetic nephropathy associated with type 1 diabetes mellitus ([place]) 03/01/2016  . Bipolar disorder in partial remission ([place]) 02/01/2016  . Chronic hypertension 02/01/2016  . DM retinopathy ([place])   . Encounter for preconception consultation   . After-cataract obscuring vision, left 09/22/2014  . MDD (major depressive disorder), severe ([place]) 05/31/2014  . Drug overdose, intentional ([place]) 03/27/2014  . Poisoning by drug or medicinal substance 03/27/2014  . Asthma 11/10/2013  . Colles' fracture of left radius 10/11/2013  . Type I diabetes mellitus with complication, uncontrolled ([place]) 10/11/2013  . MDD (major depressive disorder), recurrent episode, severe ([place]) 09/26/2013  . Generalized anxiety disorder 09/26/2013  . Aphakia, right eye 09/14/2012  . Retinal detachment, tractional, left eye 06/11/2012  . Anemia 06/07/2012  . Eczema 06/07/2012  . Renal calculi 06/07/2012  . Hyperglycemia 04/30/2012  . Diabetic retinopathy associated with type 1 diabetes mellitus ([place]) 04/30/2012  . Polyneuropathy in diabetes(357.2) 04/30/2012  . H/O insertion of insulin pump 04/30/2012  . Polyneuropathy in diabetes ([place]) 04/30/2012  . CHOLELITHIASIS 08/10/2010  . ATRIAL TACHYCARDIA 08/08/2010  . Gastroparesis 08/08/2010  . LIVER FUNCTION TESTS, ABNORMAL, HX OF 08/08/2010  . Type 1 diabetes mellitus with ketoacidosis, uncontrolled ([place]) 08/02/2010  . BOWEL INTUSSUSCEPTION 08/02/2010  . PANCREATITIS, ACUTE, HX OF 08/02/2010  . ABDOMINAL PAIN, LEFT UPPER QUADRANT 08/01/2010   Clarene Critchley PT, DPT  3:46 PM, 06/24/19 [place], [place], [address] Phone: [phone]   Fax:  [phone]  Name: Laketra Tvedt MRN: [id number] Date of Birth: September 05, 1986

## 2019-06-24 NOTE — Progress Notes (Signed)
Patient AI:[id number] [name], female DOB:[birth year], 33 y.o. [id number]  Chief Complaint  Patient presents with  . Hip Pain    left better with therapy  . Hand Pain    left ? carpal tunnel   . Leg Pain    left/ states therapy has told her is is consistent with sciatica     HPI  [name] is a 32 y.o. female who has trochanteric bursitis of the left hip. She has been to PT and it has helped.  She has some pain.    She has also developed pain at night and numbness of the left hand.  She has tried splint. She has had two injections by her family doctor.  I will get EMGs.   Body mass index is 32.12 kg/m.  ROS  Review of Systems  Constitutional: Positive for activity change.  Respiratory: Positive for shortness of breath. Negative for cough, wheezing and stridor.   Musculoskeletal: Positive for arthralgias, gait problem and myalgias.  Neurological: Positive for headaches.  Psychiatric/Behavioral: The patient is nervous/anxious.   All other systems reviewed and are negative.   All other systems reviewed and are negative.  The following is a summary of the past history medically, past history surgically, known current medicines, social history and family history.  This information is gathered electronically by the computer from prior information and documentation.  I review this each visit and have found including this information at this point in the chart is beneficial and informative.    Past Medical History:  Diagnosis Date  . Anxiety   . Asthma   . Asthma 11/10/2013  . Bipolar 1 disorder ([place])   . Depression   . Diabetic gastroparesis ([place])   . Diabetic neuropathy, type I diabetes mellitus ([place])   . Gastroparesis   . Headache(784.0)   . Hypertension   . Kidney stones   . Polyneuropathy in diabetes(357.2)   . 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[place])   . Tachycardia    baseline tachycardia   . 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 ANAL RECTAL MANOMETRY N/A 03/25/2018   Procedure: ANO RECTAL MANOMETRY;  Surgeon: Doran Stabler, MD;  Location: [facility];  Service: Gastroenterology;  Laterality: N/A;  . CESAREAN SECTION     x 2  . CHOLECYSTECTOMY    . EYE SURGERY    . REFRACTIVE SURGERY Bilateral     Family History  Problem Relation Age of Onset  . Hypertension Other   . Diabetes Mother        type 2  . Diabetes Brother        type 1  . Renal Disease Brother        renal failure  . Hypertension Brother   . Colon cancer Neg Hx   . Rectal cancer Neg Hx   . Esophageal cancer Neg Hx     Social History Social History   Tobacco Use  . Smoking status: Never Smoker  . Smokeless tobacco: Never Used  Substance Use Topics  . Alcohol use: No  . Drug use: No    Allergies  Allergen Reactions  . Toradol [Ketorolac Tromethamine]     Cannot take d/t kidney issues   . Ceftin Rash    Current Outpatient Medications  Medication Sig Dispense Refill  . acetaminophen (TYLENOL) 500 MG tablet Take 500 mg by mouth daily as needed  for mild pain.    Marland Kitchen albuterol (PROAIR HFA) 108 (90 Base) MCG/ACT inhaler INHALE 2 PUFFS BY MOUTH EVERY 6 HOURS AS NEEDED FOR SHORTNESS OF BREATH 18 g 3  . albuterol (PROVENTIL) (2.5 MG/3ML) 0.083% nebulizer solution Take 3 mLs (2.5 mg total) by nebulization every 6 (six) hours as needed for wheezing or shortness of breath. 150 mL 1  . AMBULATORY NON FORMULARY MEDICATION DOMPERIDONE 10 mg, twice a day. One with breakfast One with supper Dispense 60 tablets  2 refills 60 tablet 2  . amLODipine (NORVASC) 2.5 MG tablet Take 2.5 mg by mouth daily.    . busPIRone (BUSPAR) 15 MG tablet TAKE 2 TABLETS (30 MG TOTAL) BY MOUTH 2 (TWO) TIMES DAILY. 120 tablet 1  . carbamazepine (EQUETRO) 200 MG CP12 12 hr capsule TAKE 2 CAPSULES EVERY MORNING AND TAKE 1 CAPSULE AT BEDTIME 90 capsule 1  . ciclopirox (PENLAC) 8 %  solution Apply one coat to each toenail daily. Remove weekly with polish remover. 6.6 mL 11  . enalapril (VASOTEC) 10 MG tablet Take 10 mg by mouth daily.    [name] (AIMOVIG) 70 MG/ML SOAJ Inject 70 mg into the skin every 30 (thirty) days. 1 pen 3  . etonogestrel (NEXPLANON) 68 MG IMPL implant 1 each by Subdermal route once.    . fluticasone (FLONASE) 50 MCG/ACT nasal spray Place 2 sprays into both nostrils daily. 16 g 2  . furosemide (LASIX) 40 MG tablet Take 40 mg by mouth. Every other day    . gabapentin (NEURONTIN) 300 MG capsule Take 1 capsule (300 mg total) by mouth 3 (three) times daily. (Patient taking differently: Take 300 mg by mouth 3 (three) times daily as needed. ) 90 capsule 3  . GLUCAGON EMERGENCY 1 MG injection AS DIRECTED AS NEEDED FOR HYPOGLYCEMIA INJECTION 30 DAYS    . HYDROcodone-acetaminophen (NORCO) 5-325 MG tablet Take 1 tablet by mouth every 6 (six) hours as needed for moderate pain. 30 tablet 0  . Insulin Human (INSULIN PUMP) SOLN Inject 1 each into the skin 3 times daily with meals, bedtime and 2 AM. Novolog/36-38 units/day basal; bolus sliding scale/adjusted per sliding scale by patient    . lamoTRIgine (LAMICTAL) 150 MG tablet Take 2 tablets (300 mg total) by mouth 2 (two) times daily. 120 tablet 1  . LATUDA 120 MG TABS TAKE 1 TABLET BY MOUTH EVERY DAY 30 tablet 2  . linaclotide (LINZESS) 145 MCG CAPS capsule Take 1 capsule (145 mcg total) by mouth daily before breakfast. 30 capsule 3  . mometasone-formoterol (DULERA) 100-5 MCG/ACT AERO Inhale 2 puffs into the lungs 2 (two) times daily. 1 Inhaler 5  . montelukast (SINGULAIR) 10 MG tablet Take 1 tablet (10 mg total) by mouth at bedtime. 90 tablet 3  . NOVOLOG 100 UNIT/ML injection Inject 36-150 Units into the skin. Via insulin pump per sliding scale    . Pramipexole Dihydrochloride 0.75 MG TB24 Take 1 tablet (0.75 mg total) by mouth every evening. 30 tablet 1  . promethazine (PHENERGAN) 25 MG tablet Take 0.5  tablets (12.5 mg total) by mouth every 8 (eight) hours as needed for nausea or vomiting. 30 tablet 1  . SUMAtriptan (IMITREX) 100 MG tablet Take 1 tablet (100 mg total) by mouth every 2 (two) hours as needed for migraine. May repeat in 2 hours if headache persists or recurs. 10 tablet 0  . traZODone (DESYREL) 50 MG tablet Take 1 tablet (50 mg total) by mouth at bedtime. 30 tablet 2  .  Urea 40 % LOTN Apply 1 application topically daily. Apply only to calluses 325 mL 0   No current facility-administered medications for this visit.      Physical Exam  Blood pressure (!) 141/83, pulse 91, height 5\' 6"  (1.676 m), weight 199 lb (90.3 kg).  Constitutional: overall normal hygiene, normal nutrition, well developed, normal grooming, normal body habitus. Assistive device:none  Musculoskeletal: gait and station Limp none, muscle tone and strength are normal, no tremors or atrophy is present.  .  Neurological: coordination overall normal.  Deep tendon reflex/nerve stretch intact.  Sensation normal.  Cranial nerves II-XII intact.   Skin:   Normal overall no scars, lesions, ulcers or rashes. No psoriasis.  Psychiatric: Alert and oriented x 3.  Recent memory intact, remote memory unclear.  Normal mood and affect. Well groomed.  Good eye contact.  Cardiovascular: overall no swelling, no varicosities, no edema bilaterally, normal temperatures of the legs and arms, no clubbing, cyanosis and good capillary refill.  Lymphatic: palpation is normal.  Left hip is tender over the trochanteric area.  She has no redness. ROM of hip is full.  She has decreased sensation of the left hand median nerve distribution with positive Phalen and Tinel.  Right negative.  All other systems reviewed and are negative   The patient has been educated about the nature of the problem(s) and counseled on treatment options.  The patient appeared to understand what I have discussed and is in agreement with it.  Encounter  Diagnoses  Name Primary?  . Numbness and tingling in left hand Yes  . Carpal tunnel syndrome, left upper limb     PLAN Call if any problems.  Precautions discussed.  Continue current medications.   Return to clinic GET EMGs   PROCEDURE NOTE:  The patient request injection, verbal consent was obtained.  The left trochanteric area of the hip was prepped appropriately after time out was performed.   Sterile technique was observed and injection of 1 cc of Depo-Medrol 40 mg with several cc's of plain xylocaine. Anesthesia was provided by ethyl chloride and a 20-gauge needle was used to inject the hip area. The injection was tolerated well.  A band aid dressing was applied.  The patient was advised to apply ice later today and tomorrow to the injection sight as needed.  Call if any problem.  Precautions discussed.   Electronically Signed Sanjuana Kava, MD 9/15/202011:17 AM

## 2019-06-26 ENCOUNTER — Ambulatory Visit (HOSPITAL_COMMUNITY): Payer: Medicare Other | Admitting: Physical Therapy

## 2019-06-26 ENCOUNTER — Encounter (HOSPITAL_COMMUNITY): Payer: Self-pay | Admitting: Physical Therapy

## 2019-06-26 ENCOUNTER — Other Ambulatory Visit: Payer: Self-pay

## 2019-06-26 DIAGNOSIS — M6281 Muscle weakness (generalized): Secondary | ICD-10-CM | POA: Diagnosis not present

## 2019-06-26 DIAGNOSIS — R29898 Other symptoms and signs involving the musculoskeletal system: Secondary | ICD-10-CM

## 2019-06-26 DIAGNOSIS — M25552 Pain in left hip: Secondary | ICD-10-CM

## 2019-06-26 NOTE — Patient Instructions (Signed)
Medbridge access code: [email or web address]

## 2019-06-26 NOTE — Therapy (Addendum)
[place], [place], [address] Phone: [phone]   Fax:  [phone]  Physical Therapy Treatment / Re-assessment / Discharge summary  Patient Details  Name: [name] MRN: [id number] Date of Birth: Oct 28, 1985 Referring Provider (PT): Sanjuana Kava, MD   Encounter Date: 06/26/2019   PHYSICAL THERAPY DISCHARGE SUMMARY  Visits from Start of Care: 5  Current functional level related to goals / functional outcomes: See below   Remaining deficits: See below   Education / Equipment: Updated HEP and discussed following up with MD as needed Plan: Patient agrees to discharge.  Patient goals were partially met. Patient is being discharged due to                                                     ?????   Meeting the majority of her rehab goals, patient reporting being pleased with progress and ability to continue HEP at home          PT End of Session - 06/26/19 1625    Visit Number  5    Number of Visits  8    Date for PT Re-Evaluation  06/30/19    Authorization Type  Medicaid : 6 visits approved 06/18/19 - 07/08/19    Authorization Time Period  06/02/19 - 07/03/19    Authorization - Visit Number  3    Authorization - Number of Visits  6    PT Start Time  1520    PT Stop Time  1600    PT Time Calculation (min)  40 min    Activity Tolerance  Patient tolerated treatment well    Behavior During Therapy  [facility] for tasks assessed/performed       Past Medical History:  Diagnosis Date  . Anxiety   . Asthma   . Asthma 11/10/2013  . Bipolar 1 disorder ([place])   . Depression   . Diabetic gastroparesis ([place])   . Diabetic neuropathy, type I diabetes mellitus ([place])   . Gastroparesis   . Headache(784.0)   . Hypertension   . Kidney stones   . Polyneuropathy in diabetes(357.2)   . 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[place])   . Tachycardia    baseline tachycardia   . Type 1 DM w/severe nonproliferative  diabetic retinop and macular edema [facility])     Past Surgical History:  Procedure Laterality Date  . ANAL RECTAL MANOMETRY N/A 03/25/2018   Procedure: ANO RECTAL MANOMETRY;  Surgeon: Doran Stabler, MD;  [facility];  Service: Gastroenterology;  Laterality: N/A;  . CESAREAN SECTION     x 2  . CHOLECYSTECTOMY    . EYE SURGERY    . REFRACTIVE SURGERY Bilateral     There were no vitals filed for this visit.  Subjective Assessment - 06/26/19 1523    Subjective  Patient denied any pain currently. She reported the worst her pain has been is a 7/10 over the last week. Patient stated she has pain at night sometimes, but that she can usually change positions to improve the pain. Patient reported she has gained 10-15 pounds over the past few months. She stated she feels ready to continue HEP on her own.    Limitations  Standing;Sitting;House hold activities    How long can you sit comfortably?  1  minute    How long can you stand comfortably?  10 minutes    How long can you walk comfortably?  10 minutes    Diagnostic tests  X-ray left knee and hip 02/13/19: negative    Patient Stated Goals  Decrease pain    Currently in Pain?  No/denies         [facility] PT Assessment - 06/26/19 0001      Observation/Other Assessments   Lower Extremity Functional Scale   38/80   was 32/80     Strength   Right Hip Flexion  5/5    Right Hip Extension  5/5    Right Hip ABduction  5/5    Left Hip Flexion  5/5    Left Hip ABduction  5/5    Right Knee Flexion  5/5    Right Knee Extension  5/5    Left Knee Flexion  5/5    Left Knee Extension  5/5    Right Ankle Dorsiflexion  5/5    Left Ankle Dorsiflexion  5/5      Static Standing Balance   Static Standing - Balance Support  No upper extremity supported    Static Standing Balance -  Activities   Single Leg Stance - Right Leg;Single Leg Stance - Left Leg    Static Standing - Comment/# of Minutes  Left: 43.4 seconds; Right: 30 seconds                    [facility] Adult PT Treatment/Exercise - 06/26/19 0001      Knee/Hip Exercises: Stretches   Piriformis Stretch  Left;1 rep;20 seconds    Piriformis Stretch Limitations  Seated    Other Knee/Hip Stretches  IT band foam roll on left attempted x1 increased pain, so discontinued sidelying position. Instructed on seated self-massage using foam roller, or sidelying massage with foam roller with assistance.       Knee/Hip Exercises: Aerobic   Stationary Bike  Level 3, 3 minutes   Onset of pain, discontinued            PT Education - 06/26/19 1617    Education Details  Reviewed re-assessment findings, updated HEP, and discussed continuing to follow-up with her physician as needed.    Person(s) Educated  Patient    Methods  Explanation;Handout    Comprehension  Verbalized understanding       PT Short Term Goals - 06/26/19 1629      PT SHORT TERM GOAL #1   Title  Patient will report understanding and regular compliance with HEP to improve strength, flexibility, and decrease pain.    Time  2    Period  Weeks    Status  Achieved    Target Date  06/16/19      PT SHORT TERM GOAL #2   Title  Patient will report that her hip pain has not exceeded a 7/10 over the course of a 1 week period indicating improved tolerance to daily activities.    Time  2    Period  Weeks    Status  Achieved    Target Date  06/16/19        PT Long Term Goals - 06/26/19 1629      PT LONG TERM GOAL #1   Title  Patient will report that her hip pain has not exceeded a 3/10 over the course of a 1 week period indicating improved tolerance to daily activities.    Time  4  Period  Weeks    Status  On-going      PT LONG TERM GOAL #2   Title  Patient will demonstrate ability to maintain single limb stance for at least 15 seconds indicating improved stability and balance for safety with activities such as stair negotiation.    Time  4    Period  Weeks    Status  Achieved      PT  LONG TERM GOAL #3   Title  Patient will demonstrate improvement of at least 9 points on the LEFS indicating improved perceived functional mobility.    Time  4    Period  Weeks    Status  On-going      PT LONG TERM GOAL #4   Title  Patient will deny pain with MMT of left hip extension and abduction indicating decreased irritability for improved ability to ambulate without pain.    Time  4    Period  Weeks    Status  Achieved      PT LONG TERM GOAL #5   Title  Patient will report ability to sit for at least 10 minutes before onset of pain in order to progress to being able to sit and eat a meal with decreased pain.    Time  4    Period  Weeks    Status  Achieved      PT LONG TERM GOAL #6   Title  Patient will report ability to ambulate for at least 25 minutes with no more than 2/10 pain in order to be able to grocery shop with decreased pain.    Time  4    Period  Weeks    Status  Achieved            Plan - 06/26/19 1638    Clinical Impression Statement  This session performed a re-assessment of patient's progress towards goals as patient is going to be going out of town. Patient demonstrated achievement of 2/2 short term goals. Patient achieved 4/6 long term goals. Patient has reported overall improvement in pain symptoms, however does continue to have exacerbations of pain. Remainder of session discussed and trialed additional treatment options for at home as well as trialed stationary bicycle to see if this would be a good option to continue a fitness program at home. Patient did have discomfort following several minutes of bicycle riding. Educated patient that progressing with this slowly would be best cycling for several minutes and then taking a break. Educated patient that she should continue to follow-up with her physician and discussed that she should continue to monitor her weight and that if she noticed any sudden weight loss or weight gain that she should contact her MD as  this could be a sign of a more serious medical issue. Plan to discharge patient at this time as she reported feeling ready to continue HEP independently and stated that this is the option she would like to take as she will also be going out of town. Patient was educated that if she needed further physical therapy in the future that she would need a new referral from her MD. Patient gave consent to this plan.    Personal Factors and Comorbidities  Time since onset of injury/illness/exacerbation;Comorbidity 3+    Comorbidities  HTN, Type I DM, HTN, Asthma, Depression, Bipolar Disorder    Examination-Activity Limitations  Squat;Stairs;Bend;Stand;Sit    Examination-Participation Restrictions  Shop;Community Activity;Meal Prep;Driving    Stability/Clinical Decision Making  Stable/Uncomplicated  Rehab Potential  Good    PT Frequency  2x / week    PT Duration  4 weeks    PT Treatment/Interventions  ADLs/Self Care Home Management;Electrical Stimulation;DME Instruction;Gait training;Stair training;Functional mobility training;Therapeutic activities;Therapeutic exercise;Balance training;Patient/family education;Orthotic Fit/Training;Manual techniques;Passive range of motion;Energy conservation;Dry needling;Taping    PT Next Visit Plan  Discharged    PT Home Exercise Plan  06/02/19: isometric hip ADD/ABD 10x10'' 1x/day; 06/05/19: Trial pillows while sleeping for improved alignment; 06/20/19: Consider ab set and supine marching with ab set 1x/day; 9/17: Seated piriformis stretch, supine IT band stretch, supine hip flexor stretch    Consulted and Agree with Plan of Care  Patient       Patient will benefit from skilled therapeutic intervention in order to improve the following deficits and impairments:  Pain, Decreased mobility, Decreased activity tolerance, Decreased endurance, Decreased strength, Decreased balance, Difficulty walking, Impaired flexibility  Visit Diagnosis: Pain in left hip  Muscle weakness  (generalized)  Other symptoms and signs involving the musculoskeletal system     Problem List Patient Active Problem List   Diagnosis Date Noted  . Pseudophakia of left eye 11/04/2018  . Constipation   . CKD (chronic kidney disease), stage III ([place]) 01/01/2017  . Diabetic nephropathy associated with type 1 diabetes mellitus ([place]) 03/01/2016  . Bipolar disorder in partial remission ([place]) 02/01/2016  . Chronic hypertension 02/01/2016  . DM retinopathy ([place])   . Encounter for preconception consultation   . After-cataract obscuring vision, left 09/22/2014  . MDD (major depressive disorder), severe ([place]) 05/31/2014  . Drug overdose, intentional ([place]) 03/27/2014  . Poisoning by drug or medicinal substance 03/27/2014  . Asthma 11/10/2013  . Colles' fracture of left radius 10/11/2013  . Type I diabetes mellitus with complication, uncontrolled ([place]) 10/11/2013  . MDD (major depressive disorder), recurrent episode, severe ([place]) 09/26/2013  . Generalized anxiety disorder 09/26/2013  . Aphakia, right eye 09/14/2012  . Retinal detachment, tractional, left eye 06/11/2012  . Anemia 06/07/2012  . Eczema 06/07/2012  . Renal calculi 06/07/2012  . Hyperglycemia 04/30/2012  . Diabetic retinopathy associated with type 1 diabetes mellitus ([place]) 04/30/2012  . Polyneuropathy in diabetes(357.2) 04/30/2012  . H/O insertion of insulin pump 04/30/2012  . Polyneuropathy in diabetes ([place]) 04/30/2012  . CHOLELITHIASIS 08/10/2010  . ATRIAL TACHYCARDIA 08/08/2010  . Gastroparesis 08/08/2010  . LIVER FUNCTION TESTS, ABNORMAL, HX OF 08/08/2010  . Type 1 diabetes mellitus with ketoacidosis, uncontrolled ([place]) 08/02/2010  . BOWEL INTUSSUSCEPTION 08/02/2010  . PANCREATITIS, ACUTE, HX OF 08/02/2010  . ABDOMINAL PAIN, LEFT UPPER QUADRANT 08/01/2010   Clarene Critchley PT, DPT 4:57 PM, 06/26/19 [place], [place], [address] Phone:  [phone]   Fax:  [phone]  Name: [name] MRN: [id number] Date of Birth: 1986-10-07

## 2019-06-30 DIAGNOSIS — E1040 Type 1 diabetes mellitus with diabetic neuropathy, unspecified: Secondary | ICD-10-CM | POA: Diagnosis not present

## 2019-06-30 DIAGNOSIS — E1065 Type 1 diabetes mellitus with hyperglycemia: Secondary | ICD-10-CM | POA: Diagnosis not present

## 2019-07-04 ENCOUNTER — Other Ambulatory Visit: Payer: Self-pay | Admitting: Psychiatry

## 2019-07-07 ENCOUNTER — Telehealth: Payer: Self-pay

## 2019-07-07 DIAGNOSIS — I10 Essential (primary) hypertension: Secondary | ICD-10-CM | POA: Diagnosis not present

## 2019-07-07 DIAGNOSIS — F319 Bipolar disorder, unspecified: Secondary | ICD-10-CM | POA: Diagnosis not present

## 2019-07-07 DIAGNOSIS — E1021 Type 1 diabetes mellitus with diabetic nephropathy: Secondary | ICD-10-CM | POA: Diagnosis not present

## 2019-07-07 DIAGNOSIS — G43701 Chronic migraine without aura, not intractable, with status migrainosus: Secondary | ICD-10-CM | POA: Diagnosis not present

## 2019-07-07 NOTE — Telephone Encounter (Signed)
Prior authorization called to [facility] for approval on Pramipexole ER 0.75 mg 1/evening. Approved effective 07/07/2019-07/01/2020 [id number]

## 2019-07-08 ENCOUNTER — Emergency Department (HOSPITAL_COMMUNITY): Payer: Medicare Other

## 2019-07-08 ENCOUNTER — Other Ambulatory Visit: Payer: Self-pay

## 2019-07-08 ENCOUNTER — Ambulatory Visit (INDEPENDENT_AMBULATORY_CARE_PROVIDER_SITE_OTHER): Payer: Medicaid Other | Admitting: Family Medicine

## 2019-07-08 ENCOUNTER — Encounter: Payer: Self-pay | Admitting: Family Medicine

## 2019-07-08 ENCOUNTER — Emergency Department (HOSPITAL_COMMUNITY)
Admission: EM | Admit: 2019-07-08 | Discharge: 2019-07-09 | Disposition: A | Discharge status: Home / Self Care | Payer: Medicare Other | Attending: Emergency Medicine | Admitting: Emergency Medicine

## 2019-07-08 ENCOUNTER — Encounter (HOSPITAL_COMMUNITY): Payer: Self-pay | Admitting: Emergency Medicine

## 2019-07-08 VITALS — BP 158/100 | HR 94 | Temp 98.4°F | Resp 16 | Ht 66.0 in | Wt 198.0 lb

## 2019-07-08 DIAGNOSIS — Z9641 Presence of insulin pump (external) (internal): Secondary | ICD-10-CM | POA: Diagnosis not present

## 2019-07-08 DIAGNOSIS — I129 Hypertensive chronic kidney disease with stage 1 through stage 4 chronic kidney disease, or unspecified chronic kidney disease: Secondary | ICD-10-CM | POA: Diagnosis not present

## 2019-07-08 DIAGNOSIS — R1031 Right lower quadrant pain: Secondary | ICD-10-CM | POA: Diagnosis not present

## 2019-07-08 DIAGNOSIS — N183 Chronic kidney disease, stage 3 (moderate): Secondary | ICD-10-CM | POA: Diagnosis not present

## 2019-07-08 DIAGNOSIS — R109 Unspecified abdominal pain: Secondary | ICD-10-CM

## 2019-07-08 DIAGNOSIS — E10311 Type 1 diabetes mellitus with unspecified diabetic retinopathy with macular edema: Secondary | ICD-10-CM | POA: Insufficient documentation

## 2019-07-08 DIAGNOSIS — E1022 Type 1 diabetes mellitus with diabetic chronic kidney disease: Secondary | ICD-10-CM | POA: Diagnosis not present

## 2019-07-08 DIAGNOSIS — R1084 Generalized abdominal pain: Secondary | ICD-10-CM | POA: Diagnosis not present

## 2019-07-08 DIAGNOSIS — J45909 Unspecified asthma, uncomplicated: Secondary | ICD-10-CM | POA: Diagnosis not present

## 2019-07-08 LAB — COMPREHENSIVE METABOLIC PANEL
ALT: 16 U/L (ref 0–44)
AST: 17 U/L (ref 15–41)
Albumin: 3.9 g/dL (ref 3.5–5.0)
Alkaline Phosphatase: 121 U/L (ref 38–126)
Anion gap: 10 (ref 5–15)
BUN: 24 mg/dL — ABNORMAL HIGH (ref 6–20)
CO2: 25 mmol/L (ref 22–32)
Calcium: 9.1 mg/dL (ref 8.9–10.3)
Chloride: 100 mmol/L (ref 98–111)
Creatinine, Ser: 1.56 mg/dL — ABNORMAL HIGH (ref 0.44–1.00)
GFR calc Af Amer: 50 mL/min — ABNORMAL LOW (ref >60)
GFR calc non Af Amer: 43 mL/min — ABNORMAL LOW (ref >60)
Glucose, Bld: 132 mg/dL — ABNORMAL HIGH (ref 70–99)
Potassium: 4.4 mmol/L (ref 3.5–5.1)
Sodium: 135 mmol/L (ref 135–145)
Total Bilirubin: 0.6 mg/dL (ref 0.3–1.2)
Total Protein: 6.5 g/dL (ref 6.5–8.1)

## 2019-07-08 LAB — CBC
HCT: 41.7 % (ref 36.0–46.0)
Hemoglobin: 13.4 g/dL (ref 12.0–15.0)
MCH: 29.8 pg (ref 26.0–34.0)
MCHC: 32.1 g/dL (ref 30.0–36.0)
MCV: 92.9 fL (ref 80.0–100.0)
Platelets: 350 10*3/uL (ref 150–400)
RBC: 4.49 MIL/uL (ref 3.87–5.11)
RDW: 13.2 % (ref 11.5–15.5)
WBC: 7.6 10*3/uL (ref 4.0–10.5)
nRBC: 0 % (ref 0.0–0.2)

## 2019-07-08 LAB — URINALYSIS, ROUTINE W REFLEX MICROSCOPIC
Bacteria, UA: NONE SEEN
Bilirubin Urine: NEGATIVE
Glucose, UA: NEGATIVE mg/dL
Ketones, ur: NEGATIVE mg/dL
Leukocytes,Ua: NEGATIVE
Nitrite: NEGATIVE
Protein, ur: 30 mg/dL — AB
Specific Gravity, Urine: 1.012 (ref 1.005–1.030)
pH: 5 (ref 5.0–8.0)

## 2019-07-08 LAB — LIPASE, BLOOD: Lipase: 23 U/L (ref 11–51)

## 2019-07-08 LAB — I-STAT BETA HCG BLOOD, ED (MC, WL, AP ONLY): I-stat hCG, quantitative: <5 m[IU]/mL (ref <5)

## 2019-07-08 MED ORDER — SODIUM CHLORIDE 0.9 % IV BOLUS
1000.0000 mL | Freq: Once | INTRAVENOUS | Status: AC
  Administered 2019-07-08: 1000 mL via INTRAVENOUS

## 2019-07-08 MED ORDER — IOHEXOL 300 MG/ML  SOLN
80.0000 mL | Freq: Once | INTRAMUSCULAR | Status: AC | PRN
  Administered 2019-07-08: 80 mL via INTRAVENOUS

## 2019-07-08 MED ORDER — ONDANSETRON HCL 4 MG/2ML IJ SOLN: 4.0000 mg | Freq: Once | INTRAMUSCULAR | Status: DC

## 2019-07-08 MED ORDER — HYDROMORPHONE HCL 1 MG/ML IJ SOLN
0.5000 mg | Freq: Once | INTRAMUSCULAR | Status: AC
  Administered 2019-07-08: 23:00:00 0.5 mg via INTRAVENOUS
  Filled 2019-07-08: qty 1

## 2019-07-08 MED ORDER — SODIUM CHLORIDE 0.9% FLUSH
3.0000 mL | Freq: Once | INTRAVENOUS | Status: AC
  Administered 2019-07-08: 3 mL via INTRAVENOUS

## 2019-07-08 NOTE — ED Provider Notes (Signed)
[facility] EMERGENCY DEPARTMENT Provider Note  CSN: [id number] Arrival date & time: 07/08/19 1649  Chief Complaint(s) Abdominal Pain  HPI [name] is a 33 y.o. female with a past medical history listed below who presents to the emergency department with sudden onset right flank abdominal pain that radiated down to the right lower quadrant.  Patient has had renal stones in the past but reports that this was more intense than normal.  Pain is worse with movement and palpation.  Associated with nausea but no emesis.  No bowel changes.  Patient is currently on her menstrual cycle.  Denies any dysuria.  Seen by her PCP and was sent here to rule out appendicitis.    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[place])    Patient Active Problem List   Diagnosis Date Noted   Pseudophakia of left eye 11/04/2018   Constipation    CKD (chronic kidney disease), stage III ([place]) 01/01/2017   Diabetic nephropathy associated with type 1 diabetes mellitus ([place]) 03/01/2016   Bipolar disorder in partial remission ([place]) 02/01/2016   Chronic hypertension 02/01/2016   DM retinopathy ([place])    Encounter for preconception consultation    After-cataract obscuring vision, left 09/22/2014   MDD (major depressive disorder), severe ([place]) 05/31/2014   Drug overdose, intentional ([place]) 03/27/2014   Poisoning by drug or medicinal substance 03/27/2014   Asthma 11/10/2013   Colles' fracture of left  radius 10/11/2013   Type I diabetes mellitus with complication, uncontrolled ([place]) 10/11/2013   MDD (major depressive disorder), recurrent episode, severe ([place]) 09/26/2013   Generalized anxiety disorder 09/26/2013   Aphakia, right eye 09/14/2012   Retinal detachment, tractional, left eye 06/11/2012   Anemia 06/07/2012   Eczema 06/07/2012   Renal calculi 06/07/2012   Hyperglycemia 04/30/2012   Diabetic retinopathy associated with type 1 diabetes mellitus ([place]) 04/30/2012   Polyneuropathy in diabetes(357.2) 04/30/2012   H/O insertion of insulin pump 04/30/2012   Polyneuropathy in diabetes ([place]) 04/30/2012   CHOLELITHIASIS 08/10/2010   ATRIAL TACHYCARDIA 08/08/2010   Gastroparesis 08/08/2010   LIVER FUNCTION TESTS, ABNORMAL, HX OF 08/08/2010   Type 1 diabetes mellitus with ketoacidosis, uncontrolled ([place]) 08/02/2010   BOWEL INTUSSUSCEPTION 08/02/2010   PANCREATITIS, ACUTE, HX OF 08/02/2010   ABDOMINAL PAIN, LEFT UPPER QUADRANT 08/01/2010   Home Medication(s) Prior to Admission medications   Medication Sig Start Date End Date Taking? Authorizing Provider  acetaminophen (TYLENOL) 500 MG tablet Take 500 mg by mouth daily as needed for mild pain.    [provider]  albuterol (PROAIR HFA) 108 (90 Base) MCG/ACT inhaler INHALE 2 PUFFS BY MOUTH EVERY 6 HOURS AS NEEDED FOR SHORTNESS OF BREATH 11/12/18   Delsa Grana, PA-C  albuterol (PROVENTIL) (2.5 MG/3ML) 0.083% nebulizer solution Take 3 mLs (2.5 mg total) by nebulization every 6 (six) hours as needed for wheezing or shortness of breath. 11/12/18   Delsa Grana, PA-C  AMBULATORY NON FORMULARY  MEDICATION DOMPERIDONE 10 mg, twice a day. One with breakfast One with supper Dispense 60 tablets  2 refills 04/16/19   Nelida Meuse III, MD  amLODipine (NORVASC) 2.5 MG tablet Take 2.5 mg by mouth daily.    [provider]  busPIRone (BUSPAR) 15 MG tablet TAKE 2 TABLETS (30 MG TOTAL) BY MOUTH 2 (TWO) TIMES DAILY.  04/29/19   Cottle, Billey Co., MD  carbamazepine (EQUETRO) 200 MG CP12 12 hr capsule TAKE 2 CAPSULES EVERY MORNING AND TAKE 1 CAPSULE AT BEDTIME 04/29/19   Cottle, Billey Co., MD  ciclopirox [facility]) 8 % solution Apply one coat to each toenail daily. Remove weekly with polish remover. 11/04/18   Marzetta Board, DPM  enalapril (VASOTEC) 10 MG tablet Take 10 mg by mouth daily.    [provider]  Erenumab-aooe (AIMOVIG) 70 MG/ML SOAJ Inject 70 mg into the skin every 30 (thirty) days. 05/13/19   [name], MD  etonogestrel (NEXPLANON) 68 MG IMPL implant 1 each by Subdermal route once.    [provider]  fluticasone (FLONASE) 50 MCG/ACT nasal spray Place 2 sprays into both nostrils daily. 11/12/18   Delsa Grana, PA-C  furosemide (LASIX) 40 MG tablet Take 40 mg by mouth. Every other day    [provider]  gabapentin (NEURONTIN) 300 MG capsule Take 1 capsule (300 mg total) by mouth 3 (three) times daily. Patient taking differently: Take 300 mg by mouth 3 (three) times daily as needed.  02/13/19   [name], MD  GLUCAGON EMERGENCY 1 MG injection AS DIRECTED AS NEEDED FOR HYPOGLYCEMIA INJECTION 30 DAYS 04/30/19   [provider]  HYDROcodone-acetaminophen (NORCO) 5-325 MG tablet Take 1 tablet by mouth every 6 (six) hours as needed for moderate pain. 05/29/19   [name], MD  Insulin Human (INSULIN PUMP) SOLN Inject 1 each into the skin 3 times daily with meals, bedtime and 2 AM. Novolog/36-38 units/day basal; bolus sliding scale/adjusted per sliding scale by patient    [provider]  lamoTRIgine (LAMICTAL) 150 MG tablet Take 2 tablets (300 mg total) by mouth 2 (two) times daily. 06/05/19   Cottle, Billey Co., MD  LATUDA 120 MG TABS TAKE 1 TABLET BY MOUTH EVERY DAY 06/02/19   Cottle, Billey Co., MD  linaclotide [facility]) 145 MCG CAPS capsule Take 1 capsule (145 mcg total) by mouth daily before breakfast. 04/10/18   Danis, Kirke Corin, MD    mometasone-formoterol (DULERA) 100-5 MCG/ACT AERO Inhale 2 puffs into the lungs 2 (two) times daily. 03/15/18   Delsa Grana, PA-C  montelukast (SINGULAIR) 10 MG tablet Take 1 tablet (10 mg total) by mouth at bedtime. 10/25/18   [name], MD  NOVOLOG 100 UNIT/ML injection Inject 36-150 Units into the skin. Via insulin pump per sliding scale 10/04/18   [provider]  Pramipexole Dihydrochloride 0.75 MG TB24 Take 1 tablet (0.75 mg total) by mouth every evening. 06/05/19   Cottle, Billey Co., MD  promethazine (PHENERGAN) 25 MG tablet Take 0.5 tablets (12.5 mg total) by mouth every 8 (eight) hours as needed for nausea or vomiting. 04/15/19   Doran Stabler, MD  SUMAtriptan (IMITREX) 100 MG tablet Take 1 tablet (100 mg total) by mouth every 2 (two) hours as needed for migraine. May repeat in 2 hours if headache persists or recurs. 05/13/19   [name], MD  topiramate (TOPAMAX) 25 MG tablet Take 25 mg by mouth 2 (  two) times daily. Taper dose until to 200mg  bid    [provider]  traZODone (DESYREL) 50 MG tablet Take 1 tablet (50 mg total) by mouth at bedtime. 12/03/18   Cottle, Billey Co., MD  Urea 40 % LOTN Apply 1 application topically daily. Apply only to calluses 06/02/19   Trula Slade, DPM                                                                                                                                    Past Surgical History Past Surgical History:  Procedure Laterality Date   ANAL RECTAL MANOMETRY N/A 03/25/2018   Procedure: ANO RECTAL MANOMETRY;  Surgeon: Doran Stabler, MD;  Location: [facility] ENDOSCOPY;  Service: Gastroenterology;  Laterality: N/A;   CESAREAN SECTION     x 2   CHOLECYSTECTOMY     EYE SURGERY     REFRACTIVE SURGERY Bilateral    Family History Family History  Problem Relation Age of Onset   Hypertension Other    Diabetes Mother        type 2   Diabetes Brother        type 1   Renal Disease Brother         renal failure   Hypertension Brother    Colon cancer Neg Hx    Rectal cancer Neg Hx    Esophageal cancer Neg Hx     Social History Social History   Tobacco Use   Smoking status: Never Smoker   Smokeless tobacco: Never Used  Substance Use Topics   Alcohol use: No   Drug use: No   Allergies Toradol [ketorolac tromethamine] and Ceftin  Review of Systems Review of Systems All other systems are reviewed and are negative for acute change except as noted in the HPI  Physical Exam Vital Signs  I have reviewed the triage vital signs BP (!) 142/83    Pulse 80    Temp 98.2 F (36.8 C) (Oral)    Resp 18    Ht 5\' 6"  (1.676 m)    Wt 89 kg    SpO2 98%    BMI 31.67 kg/m   Physical Exam Vitals signs reviewed.  Constitutional:      General: She is not in acute distress.    Appearance: She is well-developed. She is not diaphoretic.  HENT:     Head: Normocephalic and atraumatic.     Right Ear: External ear normal.     Left Ear: External ear normal.     Nose: Nose normal.  Eyes:     General: No scleral icterus.    Conjunctiva/sclera: Conjunctivae normal.  Neck:     Musculoskeletal: Normal range of motion.     Trachea: Phonation normal.  Cardiovascular:     Rate and Rhythm: Normal rate and regular rhythm.  Pulmonary:     Effort: Pulmonary effort is normal. No respiratory distress.  Breath sounds: No stridor.  Abdominal:     General: There is no distension.     Tenderness: There is abdominal tenderness in the right upper quadrant, right lower quadrant and suprapubic area. There is guarding. There is no rebound. Negative signs include Murphy's sign and McBurney's sign.     Hernia: No hernia is present.    Musculoskeletal: Normal range of motion.  Neurological:     Mental Status: She is alert and oriented to person, place, and time.  Psychiatric:        Behavior: Behavior normal.     ED Results and Treatments Labs (all labs ordered are listed, but only abnormal  results are displayed) Labs Reviewed  COMPREHENSIVE METABOLIC PANEL - Abnormal; Notable for the following components:      Result Value   Glucose, Bld 132 (*)    BUN 24 (*)    Creatinine, Ser 1.56 (*)    GFR calc non Af Amer 43 (*)    GFR calc Af Amer 50 (*)    All other components within normal limits  URINALYSIS, ROUTINE W REFLEX MICROSCOPIC - Abnormal; Notable for the following components:   Hgb urine dipstick MODERATE (*)    Protein, ur 30 (*)    All other components within normal limits  LIPASE, BLOOD  CBC  I-STAT BETA HCG BLOOD, ED ([facility], [facility], [facility] ONLY)                                                                                                                         EKG  EKG Interpretation  Date/Time:    Ventricular Rate:    PR Interval:    QRS Duration:   QT Interval:    QTC Calculation:   R Axis:     Text Interpretation:        Radiology Ct Abdomen Pelvis W Contrast  Result Date: 07/08/2019 CLINICAL DATA:  Abdominal pain, appendicitis suspected, right-sided abdominal pain with nausea EXAM: CT ABDOMEN AND PELVIS WITH CONTRAST TECHNIQUE: Multidetector CT imaging of the abdomen and pelvis was performed using the standard protocol following bolus administration of intravenous contrast. CONTRAST:  7mL OMNIPAQUE IOHEXOL 300 MG/ML  SOLN COMPARISON:  CT abdomen pelvis 10/30/2018 FINDINGS: Lower chest: Lung bases are clear. Normal heart size. No pericardial effusion. Hepatobiliary: No focal liver abnormality is seen. Patient is post cholecystectomy. Slight prominence of the biliary tree likely related to reservoir effect. No calcified intraductal gallstones. Pancreas: Unremarkable. No pancreatic ductal dilatation or surrounding inflammatory changes. Spleen: Normal in size without focal abnormality. Adrenals/Urinary Tract: Adrenal glands are unremarkable. Kidneys are normal, without renal calculi, focal lesion, or hydronephrosis. Mild circumferential bladder wall thickening with  faint perivesicular haze. Stomach/Bowel: Distal esophagus, stomach and duodenal sweep are unremarkable. No bowel wall thickening or dilatation. Fecalization of the distal small bowel contents. No evidence of obstruction. Normal appendix crawls about the cecal tip without pericecal or periappendiceal inflammation. Moderate volume stool present within the colon. No colonic dilatation or wall  thickening. Vascular/Lymphatic: The aorta is normal caliber. No suspicious or enlarged lymph nodes in the included lymphatic chains. Reproductive: Uterus and bilateral adnexa are unremarkable. Other: No abdominopelvic free fluid or free gas. No bowel containing hernias. Mild ventral diastasis rectus. Postsurgical changes of the low anterior abdomen compatible with history prior Caesarean section. Slight soft tissue infiltration and coarse calcification anterior left thigh. Musculoskeletal: Minimal degenerative changes in the spine most pronounced at L5-S1. No acute osseous abnormality or suspicious osseous lesion. IMPRESSION: 1. No evidence of acute appendicitis. 2. Mild circumferential bladder wall thickening with faint perivesicular haze, suggestive of cystitis. Correlate with urinalysis. 3. Fecalization of the distal small bowel contents, suggestive of delayed transit. No evidence of bowel obstruction. Electronically Signed   By: Lovena Le M.D.   On: 07/08/2019 23:45    Pertinent labs & imaging results that were available during my care of the patient were reviewed by me and considered in my medical decision making (see chart for details).  Medications Ordered in ED Medications  ondansetron (ZOFRAN) injection 4 mg (4 mg Intravenous Refused 07/09/19 0012)  promethazine (PHENERGAN) tablet 12.5 mg (has no administration in time range)  sodium chloride flush (NS) 0.9 % injection 3 mL (3 mLs Intravenous Given 07/08/19 2318)  sodium chloride 0.9 % bolus 1,000 mL (1,000 mLs Intravenous New Bag/Given 07/08/19 2315)    HYDROmorphone (DILAUDID) injection 0.5 mg (0.5 mg Intravenous Given 07/08/19 2316)  iohexol (OMNIPAQUE) 300 MG/ML solution 80 mL (80 mLs Intravenous Contrast Given 07/08/19 2323)                                                                                                                                    Procedures Procedures  (including critical care time)  Medical Decision Making / ED Course I have reviewed the nursing notes for this encounter and the patient's prior records (if available in EHR or on provided paperwork).   [name] was evaluated in Emergency Department on 07/09/2019 for the symptoms described in the history of present illness. She was evaluated in the context of the global COVID-19 pandemic, which necessitated consideration that the patient might be at risk for infection with the SARS-CoV-2 virus that causes COVID-19. Institutional protocols and algorithms that pertain to the evaluation of patients at risk for COVID-19 are in a state of rapid change based on information released by regulatory bodies including the CDC and federal and state organizations. These policies and algorithms were followed during the patient's care in the ED.  Patient presents with right-sided abdominal pain. Sudden onset, not indolent.  She has significant tenderness to palpation in the right abdomen.  CBC without leukocytosis.  UA with hematuria.  Beta-hCG negative.  Renal stone versus appendicitis versus ovarian torsion (but lower suspicion - CT from Jan 2020 w/o ovarian cysts and exam tenderness to entire right side).  Provided with IV fluids and anti-medics.  Also provided with pain medicine.  CT to rule out appendicitis.  CT scan without evidence of appendicitis.  No evidence of obstruction.  It did reveal fecal isolation of the stool in the small bowel as well as large stool burden in the ascending colon.  He revealed bladder wall thickening but UA not consistent with a urinary  tract infection.  The patient is safe for discharge with strict return precautions.  The patient appears reasonably screened and/or stabilized for discharge and I doubt any other medical condition or other [facility] requiring further screening, evaluation, or treatment in the ED at this time prior to discharge.        Final Clinical Impression(s) / ED Diagnoses Final diagnoses:  Right sided abdominal pain  Colicky abdominal pain    The patient appears reasonably screened and/or stabilized for discharge and I doubt any other medical condition or other [facility] requiring further screening, evaluation, or treatment in the ED at this time prior to discharge.  Disposition: Discharge  Condition: Good  I have discussed the results, Dx and Tx plan with the patient who expressed understanding and agree(s) with the plan. Discharge instructions discussed at great length. The patient was given strict return precautions who verbalized understanding of the instructions. No further questions at time of discharge.    ED Discharge Orders    None        Follow Up: [name], MD [address] [place] [address] [phone]  Schedule an appointment as soon as possible for a visit  As needed      This chart was dictated using voice recognition software.  Despite best efforts to proofread,  errors can occur which can change the documentation meaning.   Fatima Blank, MD 07/09/19 Laureen Abrahams

## 2019-07-08 NOTE — Progress Notes (Signed)
Subjective:    Patient ID: [name], female    DOB: [birth year], 33 y.o.   MRN: [id number]  HPI  Patient is a 33 year old Caucasian female who has past medical history of a cholecystectomy.  She also has a history of type 1 diabetes mellitus which is been poorly controlled in the past.  She developed right-sided abdominal pain early this morning.  It was in her right mid axillary line.  However the pain has gradually throughout the day intensified and has now localized to the right lower quadrant.  She has significant pain at McBurney's point.  She has guarding and rebound.  She is crying today on exam.  There are no alleviating factors.  She states the pain is constantly there however at times it will intensify and become a 10 out of 10.  She has no audible bowel sounds on exam.  Patient reports feeling nauseated.  She denies any emesis.  She denies any diarrhea.  She denies any constipation.  Last bowel movement was yesterday with no melena or hematochezia.  She denies any hematuria or dysuria or urgency or frequency.  She does have a history of a kidney stone however she states that this pain feels different.  Walking and any movement exacerbates the pain. Past Medical History:  Diagnosis Date  . Anxiety   . Asthma   . Asthma 11/10/2013  . Bipolar 1 disorder ([place])   . Depression   . Diabetic gastroparesis ([place])   . Diabetic neuropathy, type I diabetes mellitus ([place])   . Gastroparesis   . Headache(784.0)   . Hypertension   . Kidney stones   . Polyneuropathy in diabetes(357.2)   . 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 Tachycardia    baseline tachycardia   . 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 ANAL RECTAL MANOMETRY N/A 03/25/2018   Procedure: ANO RECTAL MANOMETRY;  Surgeon: Doran Stabler, MD;  Location: [facility];  Service: Gastroenterology;  Laterality: N/A;  .  CESAREAN SECTION     x 2  . CHOLECYSTECTOMY    . EYE SURGERY    . REFRACTIVE SURGERY Bilateral    Current Outpatient Medications on File Prior to Visit  Medication Sig Dispense Refill  . acetaminophen (TYLENOL) 500 MG tablet Take 500 mg by mouth daily as needed for mild pain.    Marland Kitchen albuterol (PROAIR HFA) 108 (90 Base) MCG/ACT inhaler INHALE 2 PUFFS BY MOUTH EVERY 6 HOURS AS NEEDED FOR SHORTNESS OF BREATH 18 g 3  . albuterol (PROVENTIL) (2.5 MG/3ML) 0.083% nebulizer solution Take 3 mLs (2.5 mg total) by nebulization every 6 (six) hours as needed for wheezing or shortness of breath. 150 mL 1  . AMBULATORY NON FORMULARY MEDICATION DOMPERIDONE 10 mg, twice a day. One with breakfast One with supper Dispense 60 tablets  2 refills 60 tablet 2  . amLODipine (NORVASC) 2.5 MG tablet Take 2.5 mg by mouth daily.    . busPIRone (BUSPAR) 15 MG tablet TAKE 2 TABLETS (30 MG TOTAL) BY MOUTH 2 (TWO) TIMES DAILY. 120 tablet 1  . carbamazepine (EQUETRO) 200 MG CP12 12 hr capsule TAKE 2 CAPSULES EVERY MORNING AND TAKE 1 CAPSULE AT BEDTIME 90 capsule 1  . ciclopirox (PENLAC) 8 % solution Apply one coat to each toenail daily. Remove weekly with polish remover. 6.6 mL 11  . enalapril (VASOTEC) 10 MG tablet Take 10  mg by mouth daily.    [name] (AIMOVIG) 70 MG/ML SOAJ Inject 70 mg into the skin every 30 (thirty) days. 1 pen 3  . etonogestrel (NEXPLANON) 68 MG IMPL implant 1 each by Subdermal route once.    . fluticasone (FLONASE) 50 MCG/ACT nasal spray Place 2 sprays into both nostrils daily. 16 g 2  . furosemide (LASIX) 40 MG tablet Take 40 mg by mouth. Every other day    . gabapentin (NEURONTIN) 300 MG capsule Take 1 capsule (300 mg total) by mouth 3 (three) times daily. (Patient taking differently: Take 300 mg by mouth 3 (three) times daily as needed. ) 90 capsule 3  . GLUCAGON EMERGENCY 1 MG injection AS DIRECTED AS NEEDED FOR HYPOGLYCEMIA INJECTION 30 DAYS    . HYDROcodone-acetaminophen (NORCO) 5-325  MG tablet Take 1 tablet by mouth every 6 (six) hours as needed for moderate pain. 30 tablet 0  . Insulin Human (INSULIN PUMP) SOLN Inject 1 each into the skin 3 times daily with meals, bedtime and 2 AM. Novolog/36-38 units/day basal; bolus sliding scale/adjusted per sliding scale by patient    . lamoTRIgine (LAMICTAL) 150 MG tablet Take 2 tablets (300 mg total) by mouth 2 (two) times daily. 120 tablet 1  . LATUDA 120 MG TABS TAKE 1 TABLET BY MOUTH EVERY DAY 30 tablet 2  . linaclotide (LINZESS) 145 MCG CAPS capsule Take 1 capsule (145 mcg total) by mouth daily before breakfast. 30 capsule 3  . mometasone-formoterol (DULERA) 100-5 MCG/ACT AERO Inhale 2 puffs into the lungs 2 (two) times daily. 1 Inhaler 5  . montelukast (SINGULAIR) 10 MG tablet Take 1 tablet (10 mg total) by mouth at bedtime. 90 tablet 3  . NOVOLOG 100 UNIT/ML injection Inject 36-150 Units into the skin. Via insulin pump per sliding scale    . Pramipexole Dihydrochloride 0.75 MG TB24 Take 1 tablet (0.75 mg total) by mouth every evening. 30 tablet 1  . promethazine (PHENERGAN) 25 MG tablet Take 0.5 tablets (12.5 mg total) by mouth every 8 (eight) hours as needed for nausea or vomiting. 30 tablet 1  . SUMAtriptan (IMITREX) 100 MG tablet Take 1 tablet (100 mg total) by mouth every 2 (two) hours as needed for migraine. May repeat in 2 hours if headache persists or recurs. 10 tablet 0  . topiramate (TOPAMAX) 25 MG tablet Take 25 mg by mouth 2 (two) times daily. Taper dose until to 200mg  bid    . traZODone (DESYREL) 50 MG tablet Take 1 tablet (50 mg total) by mouth at bedtime. 30 tablet 2  . Urea 40 % LOTN Apply 1 application topically daily. Apply only to calluses 325 mL 0   No current facility-administered medications on file prior to visit.    Allergies  Allergen Reactions  . Toradol [Ketorolac Tromethamine]     Cannot take d/t kidney issues   . Ceftin Rash   Social History   Socioeconomic History  . Marital status: Legally  Separated    Spouse name: Not on file  . Number of children: 2  . Years of education: Not on file  . Highest education level: Not on file  Occupational History  . Occupation: Materials engineer  Social Needs  . Financial resource strain: Not on file  . Food insecurity    Worry: Not on file    Inability: Not on file  . Transportation needs    Medical: Not on file    Non-medical: Not on file  Tobacco Use  .  Smoking status: Never Smoker  . Smokeless tobacco: Never Used  Substance and Sexual Activity  . Alcohol use: No  . Drug use: No  . Sexual activity: Yes    Partners: Male  Lifestyle  . Physical activity    Days per week: Not on file    Minutes per session: Not on file  . Stress: Not on file  Relationships  . Social Herbalist on phone: Not on file    Gets together: Not on file    Attends religious service: Not on file    Active member of club or organization: Not on file    Attends meetings of clubs or organizations: Not on file    Relationship status: Not on file  . Intimate partner violence    Fear of current or ex partner: Not on file    Emotionally abused: Not on file    Physically abused: Not on file    Forced sexual activity: Not on file  Other Topics Concern  . Not on file  Social History Narrative   Pt has a daughter and boyfriend.            Review of Systems  All other systems reviewed and are negative.      Objective:   Physical Exam Vitals signs reviewed.  Constitutional:      General: She is in acute distress.     Appearance: She is not ill-appearing or toxic-appearing.  Eyes:     Extraocular Movements: Extraocular movements intact.     Pupils: Pupils are equal, round, and reactive to light.  Cardiovascular:     Rate and Rhythm: Regular rhythm. Tachycardia present.     Heart sounds: Normal heart sounds. No murmur.  Pulmonary:     Effort: Pulmonary effort is normal. No respiratory distress.     Breath sounds: Normal breath sounds. No  stridor. No wheezing or rales.  Abdominal:     General: Bowel sounds are absent. There is no distension.     Tenderness: There is abdominal tenderness in the right lower quadrant. There is guarding. There is no rebound. Positive signs include McBurney's sign.    Neurological:     General: No focal deficit present.     Mental Status: She is alert and oriented to person, place, and time. Mental status is at baseline.     Cranial Nerves: No cranial nerve deficit.     Sensory: No sensory deficit.     Motor: No weakness.     Coordination: Coordination normal.     Gait: Gait normal.   Patient has chronic strabismus        Assessment & Plan:  The encounter diagnosis was RLQ abdominal pain. Patient's physical exam is concerning for an acute abdomen.  History and physical exam suggests acute appendicitis as the cause.  I have recommended the patient go immediately to the emergency room.  Patient will be driven by her husband as the emergency room is only 10 minutes away.  We will notify the emergency room of their pending arrival.

## 2019-07-08 NOTE — ED Notes (Signed)
Patient transported to CT 

## 2019-07-08 NOTE — ED Triage Notes (Signed)
Pt c/o R sided abd pain, + nausea, worse with ambulation. Pt sent by PCP to r/o appe.

## 2019-07-09 DIAGNOSIS — R1084 Generalized abdominal pain: Secondary | ICD-10-CM | POA: Diagnosis not present

## 2019-07-09 MED ORDER — PROMETHAZINE HCL 25 MG PO TABS
12.5000 mg | ORAL_TABLET | Freq: Once | ORAL | Status: AC
  Administered 2019-07-09: 12.5 mg via ORAL
  Filled 2019-07-09: qty 1

## 2019-07-09 NOTE — ED Notes (Signed)
Patient verbalizes understanding of discharge instructions. Opportunity for questioning and answers were provided. Armband removed by staff, pt discharged from ED.  

## 2019-07-09 NOTE — Discharge Instructions (Signed)

## 2019-07-10 ENCOUNTER — Ambulatory Visit: Payer: Medicaid Other | Admitting: Family Medicine

## 2019-07-11 ENCOUNTER — Ambulatory Visit (INDEPENDENT_AMBULATORY_CARE_PROVIDER_SITE_OTHER): Payer: Medicare Other | Admitting: Physical Medicine and Rehabilitation

## 2019-07-11 ENCOUNTER — Encounter: Payer: Self-pay | Admitting: Physical Medicine and Rehabilitation

## 2019-07-11 ENCOUNTER — Other Ambulatory Visit: Payer: Self-pay

## 2019-07-11 DIAGNOSIS — R202 Paresthesia of skin: Secondary | ICD-10-CM | POA: Diagnosis not present

## 2019-07-11 NOTE — Progress Notes (Signed)
.  Numeric Pain Rating Scale and Functional Assessment Average Pain 8   In the last MONTH (on 0-10 scale) has pain interfered with the following?  1. General activity like being  able to carry out your everyday physical activities such as walking, climbing stairs, carrying groceries, or moving a chair?  Rating(7)     

## 2019-07-12 ENCOUNTER — Other Ambulatory Visit: Payer: Self-pay | Admitting: Psychiatry

## 2019-07-14 ENCOUNTER — Other Ambulatory Visit: Payer: Self-pay

## 2019-07-14 ENCOUNTER — Telehealth: Payer: Self-pay | Admitting: Gastroenterology

## 2019-07-14 MED ORDER — CARBAMAZEPINE ER 200 MG PO CP12: 400.0000 mg | ORAL_CAPSULE | Freq: Two times a day (BID) | ORAL | 1 refills | Status: DC

## 2019-07-14 NOTE — Telephone Encounter (Signed)
Called the patient who reports large stool burden on CT, taking Linzess with no real relief of symptoms, patient still asked to be scheduled with Dr. Loletha Carrow who has no availability until early November. Patient told she needed to be scheduled with an AP if she wanted to be seen earlier. Patient scheduled with PG on 10/9 at 1:30 pm.

## 2019-07-14 NOTE — Telephone Encounter (Signed)
Dose change with equetro? 400 mg bid?

## 2019-07-14 NOTE — Telephone Encounter (Signed)
Pt was seen in the ED and reported that she is experiencing abd pain and N/V.  Pt was offered the first available appt with Dr. Loletha Carrow on 08/12/19.  Pt declined to see a PA and requested a sooner appt.

## 2019-07-15 NOTE — Procedures (Signed)
EMG & NCV Findings: Evaluation of the left median motor nerve showed prolonged distal onset latency (8.3 ms) and decreased conduction velocity (Elbow-Wrist, 42 m/s).  The left ulnar motor nerve showed decreased conduction velocity (B Elbow-Wrist, 49 m/s).  The left median (across palm) sensory nerve showed no response (Wrist) and no response (Palm).  The left ulnar sensory nerve showed prolonged distal peak latency (3.9 ms) and decreased conduction velocity (Wrist-5th Digit, 36 m/s).  All remaining nerves (as indicated in the following tables) were within normal limits.    Needle evaluation of the left abductor pollicis brevis muscle showed diminished recruitment.  All remaining muscles (as indicated in the following table) showed no evidence of electrical instability.    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   There is no significant electrodiagnostic evidence of any other focal nerve entrapment, brachial plexopathy or cervical radiculopathy.   Recommendations: 1.  Follow-up with referring physician. 2.  Continue current management of symptoms. 3.  Continue use of resting splint at night-time and as needed during the day. 4.  Suggest surgical evaluation.  ___________________________ Laurence Spates FAAPMR Board Certified, American Board of Physical Medicine and Rehabilitation    Nerve Conduction Studies Anti Sensory Summary Table   Stim Site NR Peak (ms) Norm Peak (ms) P-T Amp (V) Norm P-T Amp Site1 Site2 Delta-P (ms) Dist (cm) Vel (m/s) Norm Vel (m/s)  Left Median Acr Palm Anti Sensory (2nd Digit)  32.4C  Wrist *NR  <3.6  >10 Wrist Palm  0.0    Palm *NR  <2.0          Left Radial Anti Sensory (Base 1st Digit)  32.8C  Wrist    2.4 <3.1 18.9  Wrist Base 1st Digit 2.4 0.0    Left Ulnar Anti Sensory (5th Digit)  32.6C   Wrist    *3.9 <3.7 15.3 >15.0 Wrist 5th Digit 3.9 14.0 *36 >38   Motor Summary Table   Stim Site NR Onset (ms) Norm Onset (ms) O-P Amp (mV) Norm O-P Amp Site1 Site2 Delta-0 (ms) Dist (cm) Vel (m/s) Norm Vel (m/s)  Left Median Motor (Abd Poll Brev)  32.6C  Wrist    *8.3 <4.2 6.0 >5 Elbow Wrist 5.2 22.0 *42 >50  Elbow    13.5  5.3         Left Ulnar Motor (Abd Dig Min)  32.2C  Wrist    3.6 <4.2 8.6 >3 B Elbow Wrist 4.0 19.5 *49 >53  B Elbow    7.6  10.1  A Elbow B Elbow 1.9 10.0 53 >53  A Elbow    9.5  10.0          EMG   Side Muscle Nerve Root Ins Act Fibs Psw Amp Dur Poly Recrt Int Fraser Din Comment  Left Abd Poll Brev Median C8-T1 Nml Nml Nml Nml Nml 0 *Reduced Nml   Left 1stDorInt Ulnar C8-T1 Nml Nml Nml Nml Nml 0 Nml Nml   Left PronatorTeres Median C6-7 Nml Nml Nml Nml Nml 0 Nml Nml   Left Biceps Musculocut C5-6 Nml Nml Nml Nml Nml 0 Nml Nml   Left Deltoid Axillary C5-6 Nml Nml Nml Nml Nml 0 Nml Nml     Nerve Conduction Studies Anti Sensory Left/Right Comparison   Stim Site L Lat (ms) R Lat (ms) L-R Lat (ms) L Amp (V) R Amp (  V) L-R Amp (%) Site1 Site2 L Vel (m/s) R Vel (m/s) L-R Vel (m/s)  Median Acr Palm Anti Sensory (2nd Digit)  32.4C  Wrist       Wrist Palm     Palm             Radial Anti Sensory (Base 1st Digit)  32.8C  Wrist 2.4   18.9   Wrist Base 1st Digit     Ulnar Anti Sensory (5th Digit)  32.6C  Wrist *3.9   15.3   Wrist 5th Digit *36     Motor Left/Right Comparison   Stim Site L Lat (ms) R Lat (ms) L-R Lat (ms) L Amp (mV) R Amp (mV) L-R Amp (%) Site1 Site2 L Vel (m/s) R Vel (m/s) L-R Vel (m/s)  Median Motor (Abd Poll Brev)  32.6C  Wrist *8.3   6.0   Elbow Wrist *42    Elbow 13.5   5.3         Ulnar Motor (Abd Dig Min)  32.2C  Wrist 3.6   8.6   B Elbow Wrist *49    B Elbow 7.6   10.1   A Elbow B Elbow 53    A Elbow 9.5   10.0            Waveforms:

## 2019-07-15 NOTE — Progress Notes (Signed)
[name] - 33 y.o. female MRN [id number]  Date of birth: [birth year]  Office Visit Note: Visit Date: 07/11/2019 PCP: [name], MD Referred by: [name], MD  Subjective: Chief Complaint  Patient presents with  . Left Wrist - Pain   HPI: [name] is a 33 y.o. female who comes in today At the request of Dr. Sanjuana Kava for electrodiagnostic study of the left upper limb.  Patient is right-hand dominant she does report numbness and tingling in the left wrist and left hand particularly the index middle and ring finger.  She reports the symptoms started a year or so ago.  She reports driving increases her symptoms as well as just nighttime.  Brace seems to help.  No prior carpal tunnel release.  She has not had electrodiagnostic study prior.  Her history is complicated by type 1 diabetes with multiple diabetic complications including retinopathy and polyneuropathy.  Patient does have numbness and burning in the feet.  She also suffers from bipolar disorder with depression.  ROS Otherwise per HPI.  Assessment & Plan: Visit Diagnoses:  1. Paresthesia of skin     Plan: Impression: The above electrodiagnostic study is ABNORMAL and reveals evidence of a moderate to severe left median nerve entrapment at the wrist (carpal tunnel syndrome) affecting sensory and motor components.   There is no significant electrodiagnostic evidence of any other focal nerve entrapment, brachial plexopathy or cervical radiculopathy.   Recommendations: 1.  Follow-up with referring physician. 2.  Continue current management of symptoms. 3.  Continue use of resting splint at night-time and as needed during the day. 4.  Suggest surgical evaluation.  Meds & Orders: No orders of the defined types were placed in this encounter.   Orders Placed This Encounter  Procedures  . NCV with EMG (electromyography)    Follow-up: Return for Sanjuana Kava, MD.   Procedures: No  procedures performed  EMG & NCV Findings: Evaluation of the left median motor nerve showed prolonged distal onset latency (8.3 ms) and decreased conduction velocity (Elbow-Wrist, 42 m/s).  The left ulnar motor nerve showed decreased conduction velocity (B Elbow-Wrist, 49 m/s).  The left median (across palm) sensory nerve showed no response (Wrist) and no response (Palm).  The left ulnar sensory nerve showed prolonged distal peak latency (3.9 ms) and decreased conduction velocity (Wrist-5th Digit, 36 m/s).  All remaining nerves (as indicated in the following tables) were within normal limits.    Needle evaluation of the left abductor pollicis brevis muscle showed diminished recruitment.  All remaining muscles (as indicated in the following table) showed no evidence of electrical instability.    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   There is no significant electrodiagnostic evidence of any other focal nerve entrapment, brachial plexopathy or cervical radiculopathy.   Recommendations: 1.  Follow-up with referring physician. 2.  Continue current management of symptoms. 3.  Continue use of resting splint at night-time and as needed during the day. 4.  Suggest surgical evaluation.  ___________________________ Laurence Spates FAAPMR Board Certified, American Board of Physical Medicine and Rehabilitation    Nerve Conduction Studies Anti Sensory Summary Table   Stim Site NR Peak (ms) Norm Peak (ms) P-T Amp (V) Norm P-T Amp Site1 Site2 Delta-P (ms) Dist (cm) Vel (m/s) Norm Vel (m/s)  Left Median Acr Palm Anti Sensory (2nd  Digit)  32.4C  Wrist *NR  <3.6  >10 Wrist Palm  0.0    Palm *NR  <2.0          Left Radial Anti Sensory (Base 1st Digit)  32.8C  Wrist    2.4 <3.1 18.9  Wrist Base 1st Digit 2.4 0.0    Left Ulnar Anti  Sensory (5th Digit)  32.6C  Wrist    *3.9 <3.7 15.3 >15.0 Wrist 5th Digit 3.9 14.0 *36 >38   Motor Summary Table   Stim Site NR Onset (ms) Norm Onset (ms) O-P Amp (mV) Norm O-P Amp Site1 Site2 Delta-0 (ms) Dist (cm) Vel (m/s) Norm Vel (m/s)  Left Median Motor (Abd Poll Brev)  32.6C  Wrist    *8.3 <4.2 6.0 >5 Elbow Wrist 5.2 22.0 *42 >50  Elbow    13.5  5.3         Left Ulnar Motor (Abd Dig Min)  32.2C  Wrist    3.6 <4.2 8.6 >3 B Elbow Wrist 4.0 19.5 *49 >53  B Elbow    7.6  10.1  A Elbow B Elbow 1.9 10.0 53 >53  A Elbow    9.5  10.0          EMG   Side Muscle Nerve Root Ins Act Fibs Psw Amp Dur Poly Recrt Int Fraser Din Comment  Left Abd Poll Brev Median C8-T1 Nml Nml Nml Nml Nml 0 *Reduced Nml   Left 1stDorInt Ulnar C8-T1 Nml Nml Nml Nml Nml 0 Nml Nml   Left PronatorTeres Median C6-7 Nml Nml Nml Nml Nml 0 Nml Nml   Left Biceps Musculocut C5-6 Nml Nml Nml Nml Nml 0 Nml Nml   Left Deltoid Axillary C5-6 Nml Nml Nml Nml Nml 0 Nml Nml     Nerve Conduction Studies Anti Sensory Left/Right Comparison   Stim Site L Lat (ms) R Lat (ms) L-R Lat (ms) L Amp (V) R Amp (V) L-R Amp (%) Site1 Site2 L Vel (m/s) R Vel (m/s) L-R Vel (m/s)  Median Acr Palm Anti Sensory (2nd Digit)  32.4C  Wrist       Wrist Palm     Palm             Radial Anti Sensory (Base 1st Digit)  32.8C  Wrist 2.4   18.9   Wrist Base 1st Digit     Ulnar Anti Sensory (5th Digit)  32.6C  Wrist *3.9   15.3   Wrist 5th Digit *36     Motor Left/Right Comparison   Stim Site L Lat (ms) R Lat (ms) L-R Lat (ms) L Amp (mV) R Amp (mV) L-R Amp (%) Site1 Site2 L Vel (m/s) R Vel (m/s) L-R Vel (m/s)  Median Motor (Abd Poll Brev)  32.6C  Wrist *8.3   6.0   Elbow Wrist *42    Elbow 13.5   5.3         Ulnar Motor (Abd Dig Min)  32.2C  Wrist 3.6   8.6   B Elbow Wrist *49    B Elbow 7.6   10.1   A Elbow B Elbow 53    A Elbow 9.5   10.0            Waveforms:            Clinical History: No specialty comments available.   She  reports that she has never smoked. She has never used smokeless tobacco. No results for input(s): HGBA1C, LABURIC in the last  8760 hours.  Objective:  VS:  HT:    WT:   BMI:     BP:   HR: bpm  TEMP: ( )  RESP:  Physical Exam Musculoskeletal:        General: No swelling, tenderness or deformity.     Comments: Inspection reveals no atrophy of the bilateral APB or FDI or hand intrinsics. There is no swelling, color changes, allodynia or dystrophic changes. There is 5 out of 5 strength in the bilateral wrist extension, finger abduction and long finger flexion.  Subjective decreased sensation median nerve distribution on the right compared to left.   There is a negative Hoffmann's test bilaterally.  Skin:    General: Skin is warm and dry.     Findings: No erythema or rash.  Neurological:     General: No focal deficit present.     Mental Status: She is alert and oriented to person, place, and time.     Motor: No weakness or abnormal muscle tone.     Coordination: Coordination normal.  Psychiatric:        Mood and Affect: Mood normal.        Behavior: Behavior normal.     Ortho Exam Imaging: No results found.  Past Medical/Family/Surgical/Social History: Medications & Allergies reviewed per EMR, new medications updated. Patient Active Problem List   Diagnosis Date Noted  . Pseudophakia of left eye 11/04/2018  . Constipation   . CKD (chronic kidney disease), stage III 01/01/2017  . Diabetic nephropathy associated with type 1 diabetes mellitus ([place]) 03/01/2016  . Bipolar disorder in partial remission ([place]) 02/01/2016  . Chronic hypertension 02/01/2016  . DM retinopathy ([employer])   . Encounter for preconception consultation   . After-cataract obscuring vision, left 09/22/2014  . MDD (major depressive disorder), severe ([place]) 05/31/2014  . Drug overdose, intentional ([place]) 03/27/2014  . Poisoning by drug or medicinal substance 03/27/2014  . Asthma 11/10/2013  . Colles' fracture of left  radius 10/11/2013  . Type I diabetes mellitus with complication, uncontrolled ([place]) 10/11/2013  . MDD (major depressive disorder), recurrent episode, severe ([place]) 09/26/2013  . Generalized anxiety disorder 09/26/2013  . Aphakia, right eye 09/14/2012  . Retinal detachment, tractional, left eye 06/11/2012  . Anemia 06/07/2012  . Eczema 06/07/2012  . Renal calculi 06/07/2012  . Hyperglycemia 04/30/2012  . Diabetic retinopathy associated with type 1 diabetes mellitus ([place]) 04/30/2012  . Polyneuropathy in diabetes(357.2) 04/30/2012  . H/O insertion of insulin pump 04/30/2012  . Polyneuropathy in diabetes ([place]) 04/30/2012  . CHOLELITHIASIS 08/10/2010  . ATRIAL TACHYCARDIA 08/08/2010  . Gastroparesis 08/08/2010  . LIVER FUNCTION TESTS, ABNORMAL, HX OF 08/08/2010  . Type 1 diabetes mellitus with ketoacidosis, uncontrolled ([place]) 08/02/2010  . BOWEL INTUSSUSCEPTION 08/02/2010  . PANCREATITIS, ACUTE, HX OF 08/02/2010  . ABDOMINAL PAIN, LEFT UPPER QUADRANT 08/01/2010   Past Medical History:  Diagnosis Date  . Anxiety   . Asthma   . Asthma 11/10/2013  . Bipolar 1 disorder ([place])   . Depression   . Diabetic gastroparesis ([place])   . Diabetic neuropathy, type I diabetes mellitus ([place])   . Gastroparesis   . Headache(784.0)   . Hypertension   . Kidney stones   . Polyneuropathy in diabetes(357.2)   . 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[place])   . Tachycardia    baseline tachycardia   . 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)    Family History  Problem Relation Age of Onset  . Hypertension  Other   . Diabetes Mother        type 2  . Diabetes Brother        type 1  . Renal Disease Brother        renal failure  . Hypertension Brother   . Colon cancer Neg Hx   . Rectal cancer Neg Hx   . Esophageal cancer Neg Hx    Past Surgical History:  Procedure Laterality Date  . ANAL RECTAL MANOMETRY N/A 03/25/2018   Procedure: ANO RECTAL MANOMETRY;  Surgeon:  Doran Stabler, MD;  Location: [facility];  Service: Gastroenterology;  Laterality: N/A;  . CESAREAN SECTION     x 2  . CHOLECYSTECTOMY    . EYE SURGERY    . REFRACTIVE SURGERY Bilateral    Social History   Occupational History  . Occupation: Home maker  Tobacco Use  . Smoking status: Never Smoker  . Smokeless tobacco: Never Used  Substance and Sexual Activity  . Alcohol use: No  . Drug use: No  . Sexual activity: Yes    Partners: Male

## 2019-07-17 ENCOUNTER — Encounter: Payer: Self-pay | Admitting: Orthopaedic Surgery

## 2019-07-17 ENCOUNTER — Ambulatory Visit: Payer: Medicare Other | Admitting: Orthopaedic Surgery

## 2019-07-17 ENCOUNTER — Other Ambulatory Visit: Payer: Self-pay

## 2019-07-17 VITALS — BP 120/79 | HR 92 | Temp 97.2°F | Ht 66.0 in | Wt 197.6 lb

## 2019-07-17 DIAGNOSIS — G5602 Carpal tunnel syndrome, left upper limb: Secondary | ICD-10-CM

## 2019-07-17 NOTE — Progress Notes (Signed)
Patient [id number] [name], female DOB:23-Mar-1986, 33 y.o. [id number]  Chief Complaint  Patient presents with  . Hand Pain    left hand    . Hip Pain    left hip     HPI  Jalie [name] is a 33 y.o. female who has carpal tunnel on the left.  She had EMGs showing severe carpal tunnel left.  I have recommended surgery. I will have her see Dr. Aline Brochure.  I have explained the surgery to her.   Body mass index is 31.89 kg/m.  ROS  Review of Systems  Constitutional: Positive for activity change.  Respiratory: Positive for shortness of breath. Negative for cough, wheezing and stridor.   Musculoskeletal: Positive for arthralgias, gait problem and myalgias.  Neurological: Positive for headaches.  Psychiatric/Behavioral: The patient is nervous/anxious.   All other systems reviewed and are negative.   All other systems reviewed and are negative.  The following is a summary of the past history medically, past history surgically, known current medicines, social history and family history.  This information is gathered electronically by the computer from prior information and documentation.  I review this each visit and have found including this information at this point in the chart is beneficial and informative.    Past Medical History:  Diagnosis Date  . Anxiety   . Asthma   . Asthma 11/10/2013  . Bipolar 1 disorder ([place])   . Depression   . Diabetic gastroparesis ([employer])   . Diabetic neuropathy, type I diabetes mellitus ([place])   . Gastroparesis   . Headache(784.0)   . Hypertension   . Kidney stones   . Polyneuropathy in diabetes(357.2)   . 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[place])   . Tachycardia    baseline tachycardia   . 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 [facility])     Past Surgical History:  Procedure Laterality Date  . ANAL RECTAL MANOMETRY N/A 03/25/2018   Procedure: ANO RECTAL MANOMETRY;  Surgeon: Doran Stabler, MD;   Location: [facility];  Service: Gastroenterology;  Laterality: N/A;  . CESAREAN SECTION     x 2  . CHOLECYSTECTOMY    . EYE SURGERY    . REFRACTIVE SURGERY Bilateral     Family History  Problem Relation Age of Onset  . Hypertension Other   . Diabetes Mother        type 2  . Diabetes Brother        type 1  . Renal Disease Brother        renal failure  . Hypertension Brother   . Colon cancer Neg Hx   . Rectal cancer Neg Hx   . Esophageal cancer Neg Hx     Social History Social History   Tobacco Use  . Smoking status: Never Smoker  . Smokeless tobacco: Never Used  Substance Use Topics  . Alcohol use: No  . Drug use: No    Allergies  Allergen Reactions  . Toradol [Ketorolac Tromethamine]     Cannot take d/t kidney issues   . Ceftin Rash    Current Outpatient Medications  Medication Sig Dispense Refill  . acetaminophen (TYLENOL) 500 MG tablet Take 500 mg by mouth daily as needed for mild pain.    Marland Kitchen albuterol (PROAIR HFA) 108 (90 Base) MCG/ACT inhaler INHALE 2 PUFFS BY MOUTH EVERY 6 HOURS AS NEEDED FOR SHORTNESS OF BREATH 18 g 3  . albuterol (PROVENTIL) (2.5 MG/3ML) 0.083% nebulizer solution Take  3 mLs (2.5 mg total) by nebulization every 6 (six) hours as needed for wheezing or shortness of breath. 150 mL 1  . AMBULATORY NON FORMULARY MEDICATION DOMPERIDONE 10 mg, twice a day. One with breakfast One with supper Dispense 60 tablets  2 refills 60 tablet 2  . amLODipine (NORVASC) 2.5 MG tablet Take 2.5 mg by mouth daily.    . busPIRone (BUSPAR) 15 MG tablet TAKE 2 TABLETS (30 MG TOTAL) BY MOUTH 2 (TWO) TIMES DAILY. 120 tablet 1  . carbamazepine (EQUETRO) 200 MG CP12 12 hr capsule Take 2 capsules (400 mg total) by mouth 2 (two) times daily. 120 capsule 1  . ciclopirox (PENLAC) 8 % solution Apply one coat to each toenail daily. Remove weekly with polish remover. 6.6 mL 11  . enalapril (VASOTEC) 10 MG tablet Take 10 mg by mouth daily.    [name] (AIMOVIG) 70  MG/ML SOAJ Inject 70 mg into the skin every 30 (thirty) days. 1 pen 3  . etonogestrel (NEXPLANON) 68 MG IMPL implant 1 each by Subdermal route once.    . fluticasone (FLONASE) 50 MCG/ACT nasal spray Place 2 sprays into both nostrils daily. 16 g 2  . furosemide (LASIX) 40 MG tablet Take 40 mg by mouth. Every other day    . gabapentin (NEURONTIN) 300 MG capsule Take 1 capsule (300 mg total) by mouth 3 (three) times daily. (Patient taking differently: Take 300 mg by mouth 3 (three) times daily as needed. ) 90 capsule 3  . GLUCAGON EMERGENCY 1 MG injection AS DIRECTED AS NEEDED FOR HYPOGLYCEMIA INJECTION 30 DAYS    . HYDROcodone-acetaminophen (NORCO) 5-325 MG tablet Take 1 tablet by mouth every 6 (six) hours as needed for moderate pain. 30 tablet 0  . Insulin Human (INSULIN PUMP) SOLN Inject 1 each into the skin 3 times daily with meals, bedtime and 2 AM. Novolog/36-38 units/day basal; bolus sliding scale/adjusted per sliding scale by patient    . lamoTRIgine (LAMICTAL) 150 MG tablet Take 2 tablets (300 mg total) by mouth 2 (two) times daily. 120 tablet 1  . LATUDA 120 MG TABS TAKE 1 TABLET BY MOUTH EVERY DAY 30 tablet 2  . linaclotide (LINZESS) 145 MCG CAPS capsule Take 1 capsule (145 mcg total) by mouth daily before breakfast. 30 capsule 3  . mometasone-formoterol (DULERA) 100-5 MCG/ACT AERO Inhale 2 puffs into the lungs 2 (two) times daily. 1 Inhaler 5  . montelukast (SINGULAIR) 10 MG tablet Take 1 tablet (10 mg total) by mouth at bedtime. 90 tablet 3  . NOVOLOG 100 UNIT/ML injection Inject 36-150 Units into the skin. Via insulin pump per sliding scale    . Pramipexole Dihydrochloride 0.75 MG TB24 Take 1 tablet (0.75 mg total) by mouth every evening. 30 tablet 1  . promethazine (PHENERGAN) 25 MG tablet Take 0.5 tablets (12.5 mg total) by mouth every 8 (eight) hours as needed for nausea or vomiting. 30 tablet 1  . SUMAtriptan (IMITREX) 100 MG tablet Take 1 tablet (100 mg total) by mouth every 2 (two)  hours as needed for migraine. May repeat in 2 hours if headache persists or recurs. 10 tablet 0  . topiramate (TOPAMAX) 25 MG tablet Take 25 mg by mouth 2 (two) times daily. Taper dose until to 200mg  bid    . traZODone (DESYREL) 50 MG tablet Take 1 tablet (50 mg total) by mouth at bedtime. 30 tablet 2  . Urea 40 % LOTN Apply 1 application topically daily. Apply only to calluses 325 mL  0   No current facility-administered medications for this visit.      Physical Exam  Blood pressure 120/79, pulse 92, temperature (!) 97.2 F (36.2 C), height 5\' 6"  (1.676 m), weight 197 lb 9.6 oz (89.6 kg).  Constitutional: overall normal hygiene, normal nutrition, well developed, normal grooming, normal body habitus. Assistive device:none  Musculoskeletal: gait and station Limp none, muscle tone and strength are normal, no tremors or atrophy is present.  .  Neurological: coordination overall normal.  Deep tendon reflex/nerve stretch intact.  Sensation normal.  Cranial nerves II-XII intact.   Skin:   Normal overall no scars, lesions, ulcers or rashes. No psoriasis.  Psychiatric: Alert and oriented x 3.  Recent memory intact, remote memory unclear.  Normal mood and affect. Well groomed.  Good eye contact.  Cardiovascular: overall no swelling, no varicosities, no edema bilaterally, normal temperatures of the legs and arms, no clubbing, cyanosis and good capillary refill.  Left hand with decreased sensation median nerve.  She has positive Phalen.  ROM is full.  Lymphatic: palpation is normal.  All other systems reviewed and are negative   The patient has been educated about the nature of the problem(s) and counseled on treatment options.  The patient appeared to understand what I have discussed and is in agreement with it.  Encounter Diagnosis  Name Primary?  . Carpal tunnel syndrome, left upper limb Yes    PLAN Call if any problems.  Precautions discussed.  Continue current medications.    Return to clinic to see Dr. Aline Brochure for surgery   Electronically Signed Sanjuana Kava, MD 10/8/20209:41 AM

## 2019-07-18 ENCOUNTER — Ambulatory Visit: Payer: Medicaid Other | Admitting: Nurse Practitioner

## 2019-07-18 ENCOUNTER — Encounter: Payer: Self-pay | Admitting: Nurse Practitioner

## 2019-07-18 VITALS — BP 124/74 | HR 100 | Temp 98.1°F | Ht 66.0 in | Wt 200.4 lb

## 2019-07-18 DIAGNOSIS — K3184 Gastroparesis: Secondary | ICD-10-CM

## 2019-07-18 DIAGNOSIS — K5900 Constipation, unspecified: Secondary | ICD-10-CM | POA: Diagnosis not present

## 2019-07-18 MED ORDER — LUBIPROSTONE 24 MCG PO CAPS: 24.0000 ug | ORAL_CAPSULE | Freq: Two times a day (BID) | ORAL | 3 refills | Status: DC

## 2019-07-18 NOTE — Progress Notes (Signed)
Chief Complaint:    The patient, nausea and vomiting  IMPRESSION and PLAN:    1.  Chronic constipation. Previous anorectal manometry showed pelvic dyssynergia, she did not want to pursue biofeedback. Also on multiple medications which can cause constipation.  Unhappy with Linzess, causes diarrhea and nonproductive bowel movements.  -Will stop Linzess -Trial of Amitiza   2. Diabetic gastroparesis. :Poorly controlled diabetes.  Says her last hemoglobin A1c was around 9.  Has been under significant amount of stress with passing of her brother and husband leaving her and kids.  She is finally close to getting the domperidone prescription.  -Obviously tighter glucose control is important for gastric emptying -Please domperidone will provide her some relief.  She cannot take Reglan due to interaction with [place] -She is at a loss as to what to eat.  She wants to lose weight and control blood sugars.  She asked about healthy low fiber food given gastroparesis.  She has to be careful with carbohydrates obviously. -I would like to send her to a dietitian to help not only with her weight/meal plans but also the types of food best suited for gastroparesis. She agrees to see a Dietician   HPI:     Patient is a 33 yo female with longstanding diabetes and bipolar disorder. . She is known to Dr. Loletha Carrow for a history of constipation and gastroparesis, last seen in August 2019.  Constipation felt to be functional in nature.  she was in the ED 07/08/2019 for evaluation of right flank pain radiating down into her RLQ.  CTAP negative for appendicitis or any other acute abnormalities.  It did suggest cystitis, urinalysis was positive for blood.  CT also showed fecalization of the distal small bowel contents.Followed ED visit she purged bowels with Mg+ Citrate which worked well but hasn't had a BM since then ( several days). She drinks plenty of fluids. She is unhappy with the Linzess, causes diarrhea but no  productive bowel movements despite taking 2 pills sometimes.  She is also frustrated with the chronic nausea.  Reglan was discontinued due to possible interaction with Latuda . We changed her to Domperidone, she hasn't been able to get the prescription yet but says it is on the way.  Her diabetes has been poorly controlled with last hemoglobin A1c around 9. She has been under a lot of stress as her brother passed away last year and her husband abandoned the family. She wants to eat healthy but cannot consume much fiber as it causes early satiety / bloating.    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     albuterol (PROAIR HFA) 108 (90 Base) MCG/ACT inhaler INHALE 2 PUFFS BY MOUTH EVERY 6 HOURS AS NEEDED FOR SHORTNESS OF BREATH 18 g 3   albuterol (PROVENTIL) (2.5 MG/3ML) 0.083% nebulizer solution Take 3 mLs (2.5 mg total) by nebulization every 6 (six) hours as needed for wheezing or shortness of breath. 150 mL 1   AMBULATORY NON FORMULARY MEDICATION DOMPERIDONE 10 mg, twice a day. One with  breakfast One with supper Dispense 60 tablets  2 refills 60 tablet 2   amLODipine (NORVASC) 2.5 MG tablet Take 2.5 mg by mouth daily.     busPIRone (BUSPAR) 15 MG tablet TAKE 2 TABLETS (30 MG TOTAL) BY MOUTH 2 (TWO) TIMES DAILY. 120 tablet 1   carbamazepine (EQUETRO) 200 MG CP12 12 hr capsule Take 2 capsules (400 mg total) by mouth 2 (two) times daily. 120 capsule 1   ciclopirox (PENLAC) 8 % solution Apply one coat to each toenail daily. Remove weekly with polish remover. 6.6 mL 11   enalapril (VASOTEC) 10 MG tablet Take 10 mg by mouth daily.     Erenumab-aooe (AIMOVIG) 70 MG/ML SOAJ Inject 70 mg into the skin every 30 (thirty) days. 1 pen 3   etonogestrel (NEXPLANON) 68 MG IMPL implant 1 each by Subdermal route once.     fluticasone (FLONASE) 50 MCG/ACT nasal spray Place 2 sprays into both nostrils daily. 16 g 2   furosemide (LASIX) 40 MG tablet Take 40 mg by mouth. Every other day     gabapentin (NEURONTIN) 300 MG capsule Take 1 capsule (300 mg total) by mouth 3 (three) times daily. (Patient taking differently: Take 300 mg by mouth 3 (three) times daily as needed. ) 90 capsule 3   GLUCAGON EMERGENCY 1 MG injection AS DIRECTED AS NEEDED FOR HYPOGLYCEMIA INJECTION 30 DAYS     HYDROcodone-acetaminophen (NORCO) 5-325 MG tablet Take 1 tablet by mouth every 6 (six) hours as needed for moderate pain. 30 tablet 0   Insulin Human (INSULIN PUMP) SOLN Inject 1 each into the skin 3 times daily with meals, bedtime and 2 AM. Novolog/36-38 units/day basal; bolus sliding scale/adjusted per sliding scale by patient     lamoTRIgine (LAMICTAL) 150 MG tablet Take 2 tablets (300 mg total) by mouth 2 (two) times daily. 120 tablet 1   LATUDA 120 MG TABS TAKE 1 TABLET BY MOUTH EVERY DAY 30 tablet 2   linaclotide (LINZESS) 145 MCG CAPS capsule Take 1 capsule (145 mcg total) by mouth daily before breakfast. 30 capsule 3   mometasone-formoterol (DULERA) 100-5 MCG/ACT AERO Inhale 2 puffs into the lungs 2  (two) times daily. 1 Inhaler 5   montelukast (SINGULAIR) 10 MG tablet Take 1 tablet (10 mg total) by mouth at bedtime. 90 tablet 3   NOVOLOG 100 UNIT/ML injection Inject 36-150 Units into the skin. Via insulin pump per sliding scale     Pramipexole Dihydrochloride 0.75 MG TB24 Take 1 tablet (0.75 mg total) by mouth every evening. 30 tablet 1   promethazine (PHENERGAN) 25 MG tablet Take 0.5 tablets (12.5 mg total) by mouth every 8 (eight) hours as needed for nausea or vomiting. 30 tablet 1   SUMAtriptan (IMITREX) 100 MG tablet Take 1 tablet (100 mg total) by mouth every 2 (two) hours as needed for migraine. May repeat in 2 hours if headache persists or recurs. 10  tablet 0   topiramate (TOPAMAX) 25 MG tablet Take 25 mg by mouth 2 (two) times daily. Taper dose until to 200mg  bid     traZODone (DESYREL) 50 MG tablet Take 1 tablet (50 mg total) by mouth at bedtime. 30 tablet 2   Urea 40 % LOTN Apply 1 application topically daily. Apply only to calluses 325 mL 0   No current facility-administered medications for this visit.     Physical Exam:     BP 124/74 (BP Location: Left Arm, Patient Position: Sitting, Cuff Size: Large)    Pulse 100    Temp 98.1 F (36.7 C)    Ht 5\' 6"  (1.676 m)    Wt 200 lb 6.4 oz (90.9 kg)    SpO2 98%    BMI 32.35 kg/m   GENERAL:  Pleasant female in NAD PSYCH: : Cooperative, normal affect EENT:  conjunctiva pink, mucous membranes moist, neck supple without masses CARDIAC:  RRR, murmur heard, no peripheral edema PULM: Normal respiratory effort, lungs CTA bilaterally, no wheezing ABDOMEN:  Nondistended, soft, nontender. No obvious masses, no hepatomegaly,  normal bowel sounds SKIN:  turgor, no lesions seen Musculoskeletal:  Normal muscle tone, normal strength NEURO: Alert and oriented x 3, no focal neurologic deficits   Tye Savoy , NP 07/18/2019, 1:49 PM

## 2019-07-18 NOTE — Patient Instructions (Signed)
If you are age 34 or older, your body mass index should be between 23-30. Your Body mass index is 32.35 kg/m. If this is out of the aforementioned range listed, please consider follow up with your Primary Care Provider.  If you are age 30 or younger, your body mass index should be between 19-25. Your Body mass index is 32.35 kg/m. If this is out of the aformentioned range listed, please consider follow up with your Primary Care Provider.   You have been given samples of Amitiza  24 mcg - take one twice daily.  If this works for you, call us and we will send a prescription to your pharmacy.  You have been referred to [facility] for weight and gastroparesis diet.  They will contact you with an appointment.  Follow up as needed.  Thank you for choosing me and [facility].   Tye Savoy, NP

## 2019-07-21 ENCOUNTER — Encounter: Payer: Self-pay | Admitting: Nurse Practitioner

## 2019-07-22 ENCOUNTER — Ambulatory Visit (INDEPENDENT_AMBULATORY_CARE_PROVIDER_SITE_OTHER): Payer: Medicaid Other | Admitting: Psychiatry

## 2019-07-22 ENCOUNTER — Other Ambulatory Visit: Payer: Self-pay

## 2019-07-22 ENCOUNTER — Encounter: Payer: Self-pay | Admitting: Psychiatry

## 2019-07-22 DIAGNOSIS — F314 Bipolar disorder, current episode depressed, severe, without psychotic features: Secondary | ICD-10-CM | POA: Diagnosis not present

## 2019-07-22 DIAGNOSIS — F4001 Agoraphobia with panic disorder: Secondary | ICD-10-CM

## 2019-07-22 DIAGNOSIS — F411 Generalized anxiety disorder: Secondary | ICD-10-CM | POA: Diagnosis not present

## 2019-07-22 NOTE — Progress Notes (Signed)
[name] [id number] [birth year] 33 y.o.  Subjective:   Patient ID:  [name] is a 33 y.o. (DOB 1986-04-23) female.  Chief Complaint:  Chief Complaint  Patient presents with  . Follow-up    Medication Management  . Depression    Medication Management  . Anxiety    Medication Management  . Other    Bipolar 1.    HPI [name] presents to the office today for follow-up of bipolar disorder and generalized anxiety disorder.  Last seen June 05, 2019.  The following changes were made: Change pramipexole ER and increase to 0.75 mg pm to rid nausea and improve mood in the afternoon.  For tiredness in the afternoon switch Equetro to 400 mg AM , 1@ 6 and 1 at 11. For panic in daytime start gabapentin 300 mg each am as preventative.  She's only taking it prn now. We continued unchanged Latuda 120 mg and lamotrigine 300 mg twice daily for bipolar disorder.  She just got the ER pramipexole about a week ago.  Can't tell difference with nausea DT  gastroparesis worse lately with constipation  Less afternoon tiredness with current split Equetro dose.    Hard to assess anxiety bc a lot of stress over the last couple of weeks bc they have to move out of parents house to allow parents to move back in.  Once disability received then has to move.  $ stress also with H failed business and he's stressed out and moody.  More panic attacks with chest tightness DT stress.  Still struggles with residual depression and anxiety.  Conc is fair.  Energy not great.  Mind races with worry all the time and H notices she's always talking and thinking.  More trouble going to sleep but usually trazodone helps.  Caffeine with dinner.    More tired in the pm by 9 pm. Sleep about 8 hours.  Awaken often.  Gets out of bed.  No reason.  Custody battle with exH.  He's nicer to her now.  B diabetic died last year with DM and kidney failure.  Weight going up. Hates weight gain effects of  meds.    Consistent with meds.  No sig SI other than fleeting rarely.  Scattered concentration and racing thoughts randomly.   Sleep OK.  Remarried April. 34 yo D ADD is stressful and disrespectful. Disability review pending.  Lawyer feels confident. A lot of losses in recent years including H leaving, losing house, trying to teach ADD D, etc.  Past Psychiatric Medication Trials: Latuda 120, lamotrigine 300 twice daily, Equetro 900 daily,  brief Vraylar, lithium felt slowed down, Seroquel,Abilify 15 sertraline, duloxetine,  gabapentin 600 mg 3 times daily tiredness,  buspirone 30 twice daily,, clonazepam, trazodone, Xanax , hydroxyzine NR,  History of suicidal ideation and about 5 suicide attempts with about 4 psychiatric hospitalizations  Review of Systems:  Review of Systems  Gastrointestinal: Positive for nausea and vomiting.  Musculoskeletal: Positive for arthralgias.  Neurological: Negative for tremors and weakness.  Psychiatric/Behavioral: Positive for sleep disturbance.    Medications: I have reviewed the patient's current medications.  Current Outpatient Medications  Medication Sig Dispense Refill  . acetaminophen (TYLENOL) 500 MG tablet Take 500 mg by mouth daily as needed for mild pain.    Marland Kitchen albuterol (PROAIR HFA) 108 (90 Base) MCG/ACT inhaler INHALE 2 PUFFS BY MOUTH EVERY 6 HOURS AS NEEDED FOR SHORTNESS OF BREATH 18 g 3  . albuterol (PROVENTIL) (2.5 MG/3ML) 0.083%  nebulizer solution Take 3 mLs (2.5 mg total) by nebulization every 6 (six) hours as needed for wheezing or shortness of breath. 150 mL 1  . AMBULATORY NON FORMULARY MEDICATION DOMPERIDONE 10 mg, twice a day. One with breakfast One with supper Dispense 60 tablets  2 refills 60 tablet 2  . amLODipine (NORVASC) 2.5 MG tablet Take 2.5 mg by mouth daily.    . busPIRone (BUSPAR) 15 MG tablet TAKE 2 TABLETS (30 MG TOTAL) BY MOUTH 2 (TWO) TIMES DAILY. 120 tablet 1  . carbamazepine (EQUETRO) 200 MG CP12 12 hr capsule Take  2 capsules (400 mg total) by mouth 2 (two) times daily. 120 capsule 1  . ciclopirox (PENLAC) 8 % solution Apply one coat to each toenail daily. Remove weekly with polish remover. 6.6 mL 11  . enalapril (VASOTEC) 10 MG tablet Take 10 mg by mouth daily.    [name] (AIMOVIG) 70 MG/ML SOAJ Inject 70 mg into the skin every 30 (thirty) days. 1 pen 3  . etonogestrel (NEXPLANON) 68 MG IMPL implant 1 each by Subdermal route once.    . fluticasone (FLONASE) 50 MCG/ACT nasal spray Place 2 sprays into both nostrils daily. 16 g 2  . furosemide (LASIX) 40 MG tablet Take 40 mg by mouth. Every other day    . gabapentin (NEURONTIN) 300 MG capsule Take 1 capsule (300 mg total) by mouth 3 (three) times daily. (Patient taking differently: Take 300 mg by mouth 3 (three) times daily as needed. ) 90 capsule 3  . GLUCAGON EMERGENCY 1 MG injection AS DIRECTED AS NEEDED FOR HYPOGLYCEMIA INJECTION 30 DAYS    . Insulin Human (INSULIN PUMP) SOLN Inject 1 each into the skin 3 times daily with meals, bedtime and 2 AM. Novolog/36-38 units/day basal; bolus sliding scale/adjusted per sliding scale by patient    . lamoTRIgine (LAMICTAL) 150 MG tablet Take 2 tablets (300 mg total) by mouth 2 (two) times daily. 120 tablet 1  . LATUDA 120 MG TABS TAKE 1 TABLET BY MOUTH EVERY DAY 30 tablet 2  . linaclotide (LINZESS) 145 MCG CAPS capsule Take 1 capsule (145 mcg total) by mouth daily before breakfast. 30 capsule 3  . lubiprostone (AMITIZA) 24 MCG capsule Take 1 capsule (24 mcg total) by mouth 2 (two) times daily with a meal. 60 capsule 3  . mometasone-formoterol (DULERA) 100-5 MCG/ACT AERO Inhale 2 puffs into the lungs 2 (two) times daily. 1 Inhaler 5  . montelukast (SINGULAIR) 10 MG tablet Take 1 tablet (10 mg total) by mouth at bedtime. 90 tablet 3  . NOVOLOG 100 UNIT/ML injection Inject 36-150 Units into the skin. Via insulin pump per sliding scale    . Pramipexole Dihydrochloride 0.75 MG TB24 Take 1 tablet (0.75 mg total) by  mouth every evening. 30 tablet 1  . promethazine (PHENERGAN) 25 MG tablet Take 0.5 tablets (12.5 mg total) by mouth every 8 (eight) hours as needed for nausea or vomiting. 30 tablet 1  . SUMAtriptan (IMITREX) 100 MG tablet Take 1 tablet (100 mg total) by mouth every 2 (two) hours as needed for migraine. May repeat in 2 hours if headache persists or recurs. 10 tablet 0  . topiramate (TOPAMAX) 25 MG tablet Take 25 mg by mouth 2 (two) times daily. Taper dose until to 200mg  bid    . traZODone (DESYREL) 50 MG tablet Take 1 tablet (50 mg total) by mouth at bedtime. 30 tablet 2  . Ubrogepant (UBRELVY) 100 MG TABS Take 100 mg by mouth.    Marland Kitchen  Urea 40 % LOTN Apply 1 application topically daily. Apply only to calluses 325 mL 0  . HYDROcodone-acetaminophen (NORCO) 5-325 MG tablet Take 1 tablet by mouth every 6 (six) hours as needed for moderate pain. (Patient not taking: Reported on 07/22/2019) 30 tablet 0   No current facility-administered medications for this visit.     Medication Side Effects: tired in the afternoon.  Allergies:  Allergies  Allergen Reactions  . Toradol [Ketorolac Tromethamine]     Cannot take d/t kidney issues   . Ceftin Rash    Past Medical History:  Diagnosis Date  . Anxiety   . Asthma   . Asthma 11/10/2013  . Bipolar 1 disorder ([place])   . Depression   . Diabetic gastroparesis ([place])   . Diabetic neuropathy, type I diabetes mellitus ([place])   . Gastroparesis   . Headache(784.0)   . Hypertension   . Kidney stones   . Polyneuropathy in diabetes(357.2)   . 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[place])   . Tachycardia    baseline tachycardia   . 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 Hypertension Other   . Diabetes Mother        type 2  . Diabetes Brother        type 1  . Renal Disease Brother        renal failure  . Hypertension Brother   . Colon cancer Neg Hx   . Rectal  cancer Neg Hx   . Esophageal cancer Neg Hx     Social History   Socioeconomic History  . Marital status: Legally Separated    Spouse name: Not on file  . Number of children: 2  . Years of education: Not on file  . Highest education level: Not on file  Occupational History  . Occupation: Materials engineer  Social Needs  . Financial resource strain: Not on file  . Food insecurity    Worry: Not on file    Inability: Not on file  . Transportation needs    Medical: Not on file    Non-medical: Not on file  Tobacco Use  . Smoking status: Never Smoker  . Smokeless tobacco: Never Used  Substance and Sexual Activity  . Alcohol use: No  . Drug use: No  . Sexual activity: Yes    Partners: Male  Lifestyle  . Physical activity    Days per week: Not on file    Minutes per session: Not on file  . Stress: Not on file  Relationships  . Social Herbalist on phone: Not on file    Gets together: Not on file    Attends religious service: Not on file    Active member of club or organization: Not on file    Attends meetings of clubs or organizations: Not on file    Relationship status: Not on file  . Intimate partner violence    Fear of current or ex partner: Not on file    Emotionally abused: Not on file    Physically abused: Not on file    Forced sexual activity: Not on file  Other Topics Concern  . Not on file  Social History Narrative   Pt has a daughter and boyfriend.           Past Medical History, Surgical history, Social history, and Family history were reviewed and updated as  appropriate.   Please see review of systems for further details on the patient's review from today.   Objective:   Physical Exam:  There were no vitals taken for this visit.  Physical Exam Constitutional:      General: She is not in acute distress.    Appearance: She is well-developed.  Musculoskeletal:        General: No deformity.  Neurological:     Mental Status: She is alert and  oriented to person, place, and time.     Coordination: Coordination normal.  Psychiatric:        Attention and Perception: She is attentive. She does not perceive auditory hallucinations.        Mood and Affect: Mood is anxious and depressed. Affect is not labile, blunt, angry, tearful or inappropriate.        Speech: Speech normal.        Behavior: Behavior normal.        Thought Content: Thought content normal. Thought content does not include homicidal or suicidal ideation. Thought content does not include homicidal or suicidal plan.        Cognition and Memory: Cognition normal.        Judgment: Judgment normal.     Comments: Insight intact. No auditory or visual hallucinations. No delusions.      Lab Review:     Component Value Date/Time   NA 135 07/08/2019 1732   K 4.4 07/08/2019 1732   CL 100 07/08/2019 1732   CO2 25 07/08/2019 1732   GLUCOSE 132 (H) 07/08/2019 1732   BUN 24 (H) 07/08/2019 1732   CREATININE 1.56 (H) 07/08/2019 1732   CREATININE 1.81 (H) 06/17/2018 1231   CALCIUM 9.1 07/08/2019 1732   PROT 6.5 07/08/2019 1732   ALBUMIN 3.9 07/08/2019 1732   AST 17 07/08/2019 1732   ALT 16 07/08/2019 1732   ALKPHOS 121 07/08/2019 1732   BILITOT 0.6 07/08/2019 1732   GFRNONAA 43 (L) 07/08/2019 1732   GFRNONAA 36 (L) 06/17/2018 1231   GFRAA 50 (L) 07/08/2019 1732   GFRAA 42 (L) 06/17/2018 1231       Component Value Date/Time   WBC 7.6 07/08/2019 1732   RBC 4.49 07/08/2019 1732   HGB 13.4 07/08/2019 1732   HCT 41.7 07/08/2019 1732   PLT 350 07/08/2019 1732   MCV 92.9 07/08/2019 1732   MCH 29.8 07/08/2019 1732   MCHC 32.1 07/08/2019 1732   RDW 13.2 07/08/2019 1732   LYMPHSABS 1.5 10/30/2018 1027   MONOABS 0.4 10/30/2018 1027   EOSABS 0.3 10/30/2018 1027   BASOSABS 0.1 10/30/2018 1027    No results found for: POCLITH, LITHIUM   No results found for: PHENYTOIN, PHENOBARB, VALPROATE, CBMZ   .res Assessment: Plan:    M[place]ie was seen today for follow-up,  depression, anxiety and other.  Diagnoses and all orders for this visit:  Bipolar I disorder with depression, severe ([place])  Generalized anxiety disorder  Panic disorder with agoraphobia    Patient with a history of multiple med failures and history of psychiatric hospitalization and suicide attempts recent worsening of bipolar depression.  Chronic stressors contribute to mood and anxiety problems.   Concern about potential weight gain with medications.  She has taking the major mood stabilizers with low weight gain potential.  Therefore I am hesitant to switch major mood stabilizers.  Discussed the pros and cons of switching mood stabilizers.  Could consider ultra low-dose lithium.   Continue Latuda 120 mg and lamotrigine  300 mg twice daily for bipolar disorder.  Discussed the necessary polypharmacy at this time to manage her bipolar symptoms and anxiety.  Discussed drug to drug interaction issues between little to the and carbamazepine.  Carbamazepine can affect blood levels of multiple other medications but at this time it appears unavoidable.  She is still getting benefit out of the various medication she is taking.  Discussed potential metabolic side effects associated with atypical antipsychotics, as well as potential risk for movement side effects. Advised pt to contact office if movement side effects occur.   Counseled patient regarding potential benefits, risks, and side effects of Lamictal and carbamazepine to include potential risk of Stevens-Johnson syndrome. Advised patient to stop taking Lamictal and contact office immediately if rash develops and to seek urgent medical attention if rash is severe and/or spreading quickly.  Needs counseling.  She agrees.  I feel alone. She needs counselor who can accept Medicaid.  Disc grief.  No caffeine after 3 pm.   No med changes this visit.     This was a 40-minute appointment  Follow-up 6 weeks as soon as available.  [name],  MD, DFAPA   Please see After Visit Summary for patient specific instructions.  Future Appointments  Date Time Provider [place]  07/29/2019  2:20 PM Carole Civil, MD [facility] None  09/02/2019  1:45 PM Trula Slade, DPM [facility]    No orders of the defined types were placed in this encounter.   -------------------------------

## 2019-07-22 NOTE — Progress Notes (Signed)
____________________________________________________________  Attending physician addendum:  Thank you for sending this case to me. I have reviewed the entire note, and the outlined plan seems appropriate.  Perhaps a dietitian can give her some guidance, it is a good idea. She is caught in a vicious cycle, and unquestionably needs better diabetic control for both short and long-term management of these GI motility issues.  The constipation seems unlikely to appreciably improve without biofeedback, but she seems unable or unwilling to pursue that. In my opinion, domperidone is still safer than metoclopramide from a med interaction risk standpoint.  Unfortunately, her social situation seems to be an ongoing obstacle to improved health.  Wilfrid Lund, MD  ____________________________________________________________

## 2019-07-23 DIAGNOSIS — H5021 Vertical strabismus, right eye: Secondary | ICD-10-CM | POA: Diagnosis not present

## 2019-07-23 DIAGNOSIS — H50111 Monocular exotropia, right eye: Secondary | ICD-10-CM | POA: Insufficient documentation

## 2019-07-23 DIAGNOSIS — H2701 Aphakia, right eye: Secondary | ICD-10-CM | POA: Diagnosis not present

## 2019-07-23 DIAGNOSIS — H35372 Puckering of macula, left eye: Secondary | ICD-10-CM | POA: Insufficient documentation

## 2019-07-23 DIAGNOSIS — H532 Diplopia: Secondary | ICD-10-CM | POA: Insufficient documentation

## 2019-07-23 LAB — HM DIABETES EYE EXAM

## 2019-07-28 ENCOUNTER — Telehealth: Payer: Self-pay | Admitting: Nurse Practitioner

## 2019-07-28 NOTE — Telephone Encounter (Signed)
Pt reported that [name] samples are not helping.  She is still constipated and nauseous.

## 2019-07-29 ENCOUNTER — Telehealth: Payer: Self-pay | Admitting: Psychiatry

## 2019-07-29 ENCOUNTER — Ambulatory Visit: Payer: Medicaid Other | Admitting: Orthopedic Surgery

## 2019-07-29 ENCOUNTER — Other Ambulatory Visit: Payer: Self-pay | Admitting: Psychiatry

## 2019-07-29 MED ORDER — DIVALPROEX SODIUM ER 500 MG PO TB24: ORAL_TABLET | ORAL | 0 refills | Status: DC

## 2019-07-29 NOTE — Telephone Encounter (Signed)
patient been having a lot of racing thoughts, cannot sleep, patient would like to know if she can try Valproate she's only getting 2 hrs. Of sleep Trazidone not working

## 2019-07-29 NOTE — Telephone Encounter (Signed)
Okay to start Depakote ER 500 mg tablets 1 nightly for 5 days, then 2 nightly for 5 days then 3 nightly.  We may have to go higher to get it to work.  She can go up in the dose faster if she is tolerating it well.  Prescription sent in.

## 2019-07-30 ENCOUNTER — Ambulatory Visit: Payer: Medicaid Other | Admitting: Orthopedic Surgery

## 2019-07-30 DIAGNOSIS — E1065 Type 1 diabetes mellitus with hyperglycemia: Secondary | ICD-10-CM | POA: Diagnosis not present

## 2019-07-30 DIAGNOSIS — E1040 Type 1 diabetes mellitus with diabetic neuropathy, unspecified: Secondary | ICD-10-CM | POA: Diagnosis not present

## 2019-07-30 NOTE — Telephone Encounter (Signed)
Pt. Made aware and verbalized understanding.

## 2019-07-30 NOTE — Telephone Encounter (Signed)
Pt is calling back about Amitiza samples not helping- she is still waking up nauseous and is constipated. She has not heard back yet from the other day when she called so she is asking for nurse to call her.

## 2019-07-30 NOTE — Telephone Encounter (Signed)
Please forward to the nurse of the provider that last saw the patient in clinic. Thank you.

## 2019-07-30 NOTE — Telephone Encounter (Signed)
[name], the patient states "I cannot even remember when I last passed any stool." Passes gas. Feels very nauseated. Phenergan does not help with the nausea. Amitiza does not help with the constipation. Does not feel an urge to have a bowel movement. Does have abdominal pain. Please advise.

## 2019-07-30 NOTE — Telephone Encounter (Signed)
Left pt. A VM to return my call.

## 2019-08-01 NOTE — Telephone Encounter (Signed)
[name], did she ever get the Domperidone? She cannot take Reglan. If she hasn't tried Zofran we can try that, even go to 8 mg Q 8 hours if needed. Regarding constipation, she didn't like Linzess, Amitiza doesn't help her. Dr. Loletha Carrow has recommended biofeedback for constipation and an abnormal manometry but she hasn't been interested. Will she reconsider? Otherwise we can see if insurance will pay for Motegrity. Thanks

## 2019-08-04 ENCOUNTER — Other Ambulatory Visit: Payer: Self-pay

## 2019-08-04 DIAGNOSIS — K5902 Outlet dysfunction constipation: Secondary | ICD-10-CM

## 2019-08-04 DIAGNOSIS — K5909 Other constipation: Secondary | ICD-10-CM

## 2019-08-04 DIAGNOSIS — K5901 Slow transit constipation: Secondary | ICD-10-CM

## 2019-08-04 NOTE — Telephone Encounter (Signed)
Left a message to call back to discuss

## 2019-08-04 NOTE — Telephone Encounter (Addendum)
Domperidone - she is getting an international money order to purchase this. She says she is doing that tonight.  Zofran-she says this does not work and that is why she was given Phenergan by Dr Loletha Carrow.  Biofeedback-she is agreeing to the referral. She also wants the prescription of Motegrity. Please tell me the SIG and dosage.   Thanks

## 2019-08-05 ENCOUNTER — Ambulatory Visit: Payer: Medicaid Other | Admitting: Orthopedic Surgery

## 2019-08-05 ENCOUNTER — Other Ambulatory Visit: Payer: Self-pay

## 2019-08-05 ENCOUNTER — Encounter: Payer: Self-pay | Admitting: Orthopedic Surgery

## 2019-08-05 VITALS — BP 144/81 | HR 101 | Temp 97.2°F | Ht 66.0 in | Wt 199.0 lb

## 2019-08-05 DIAGNOSIS — I10 Essential (primary) hypertension: Secondary | ICD-10-CM | POA: Diagnosis not present

## 2019-08-05 DIAGNOSIS — G5602 Carpal tunnel syndrome, left upper limb: Secondary | ICD-10-CM | POA: Diagnosis not present

## 2019-08-05 DIAGNOSIS — F319 Bipolar disorder, unspecified: Secondary | ICD-10-CM | POA: Diagnosis not present

## 2019-08-05 DIAGNOSIS — G43701 Chronic migraine without aura, not intractable, with status migrainosus: Secondary | ICD-10-CM | POA: Diagnosis not present

## 2019-08-05 DIAGNOSIS — E1021 Type 1 diabetes mellitus with diabetic nephropathy: Secondary | ICD-10-CM | POA: Diagnosis not present

## 2019-08-05 MED ORDER — GABAPENTIN 400 MG PO CAPS: 400.0000 mg | ORAL_CAPSULE | Freq: Three times a day (TID) | ORAL | 1 refills | Status: DC

## 2019-08-05 NOTE — Patient Instructions (Signed)
Open Carpal Tunnel Release    Open carpal tunnel release is a surgery to relieve symptoms caused by carpal tunnel syndrome. The carpal tunnel is a narrow, hollow space in the wrist. It is located between the wrist bones and a band of connective tissue (transverse carpal ligament, also known as the flexor retinaculum). The nerve that supplies most of the hand (median nerve) passes through the carpal tunnel, and so do tissues that connect bones to muscles (tendons) in the hand and arm. Carpal tunnel syndrome makes this space swell and become narrow. The swelling pinches the median nerve and causes pain and numbness.  During carpal tunnel release surgery, the transverse carpal ligament is cut to make more room in the carpal tunnel space. This also lessens the pressure on the median nerve. You may have this surgery if other types of treatment have not relieved your carpal tunnel symptoms. This surgery is usually done only for the hand that you use more often (dominant hand), but it may be done for both hands depending on your symptoms.  Tell a health care provider about:  · Any allergies you have.  · All medicines you are taking, including vitamins, herbs, eye drops, creams, and over-the-counter medicines.  · Any problems you or family members have had with anesthetic medicines.  · Any blood disorders you have.  · Any surgeries you have had.  · Any medical conditions you have.  · Whether you are pregnant or may be pregnant.  What are the risks?  Generally, this is a safe procedure. However, problems may occur, including:  · Infection.  · Bleeding.  · Injury to the median nerve.  · Allergic reactions to medicines.  · The surgery failing to relieve your symptoms, or making your symptoms worse.  What happens before the procedure?  Medicines  · Ask your health care provider about:  ? Changing or stopping your regular medicines. This is especially important if you are taking diabetes medicines or blood thinners.  ? Taking  medicines such as aspirin and ibuprofen. These medicines can thin your blood. Do not take these medicines unless your health care provider tells you to take them.  ? Taking over-the-counter medicines, vitamins, herbs, and supplements.  · You may be given antibiotic medicine to help prevent infection.  Staying hydrated  Follow instructions from your health care provider about hydration, which may include:  · Up to 2 hours before the procedure - you may continue to drink clear liquids, such as water, clear fruit juice, black coffee, and plain tea.  Eating and drinking restrictions  Follow instructions from your health care provider about eating and drinking, which may include:  · 8 hours before the procedure - stop eating heavy meals or foods such as meat, fried foods, or fatty foods.  · 6 hours before the procedure - stop eating light meals or foods, such as toast or cereal.  · 6 hours before the procedure - stop drinking milk or drinks that contain milk.  · 2 hours before the procedure - stop drinking clear liquids.  General instructions  · Ask your health care provider how your surgical site will be marked or identified.  · You may be asked to shower with a germ-killing soap.  · Plan to have someone take you home from the hospital or clinic.  · Plan to have a responsible adult care for you for at least 24 hours after you leave the hospital or clinic. This is important.  What happens   a germ-killing (antiseptic) solution.  An IV will be inserted into one of your veins.  You will be given one of the following: ? A medicine to numb the wrist area (local anesthetic). You may also be given a medicine to help you relax (sedative). ? A medicine to make you fall asleep (general anesthetic).  An incision  will be made in your wrist, on the same side as your palm.  The skin of your wrist will be spread to expose the transverse carpal ligament.  The transverse carpal ligament will be cut to make more room in the carpal tunnel space.  Your incision will be closed with stitches (sutures) or staples.  A bandage (dressing) will be placed over your wrist and wrapped around your hand and wrist. The procedure may vary among health care providers and hospitals. What happens after the procedure?  Your blood pressure, heart rate, breathing rate, and blood oxygen level will be monitored until the medicines you were given have worn off.  You will be given pain medicine as needed.  A splint or brace may be placed over your dressing, to hold your hand and wrist in place while you heal.  Do not drive until your health care provider approves. Summary  Carpal tunnel release is a surgery to relieve pain and numbness in the hand caused by swelling around a nerve (carpal tunnel syndrome).  You may have this surgery if other types of treatment have not relieved your carpal tunnel symptoms.  During carpal tunnel release surgery, a band of connective tissue (transverse carpal ligament) is cut to make more room in the carpal tunnel space. This information is not intended to replace advice given to you by your health care provider. Make sure you discuss any questions you have with your health care provider. Document Released: 12/16/2003 Document Revised: 09/07/2017 Document Reviewed: 06/04/2017 Elsevier Patient Education  2020 [employer].

## 2019-08-05 NOTE — Telephone Encounter (Signed)
The Motegrity might still be helpful for her, even with pelvic floor dysfunction. There is also the possibility that medicine might improve the stomach emptying somewhat, given its mechanism of action.  We currently have samples of Motegrity 2 mg tablets, so it would be best for her to come get 2 boxes of those and try 1 tablet daily.  Scopolamine is likely to worsen her constipation.

## 2019-08-05 NOTE — Telephone Encounter (Signed)
I have spoken with the patient. She agrees to try the biofeedback and the Motegrity. She will follow through with ordering the Domperidone. She is scheduled for follow up in December. She is advised Mag Citrate is not a good option for her to use on a weekly basis. She should try MOM 30 ml once to twice weekly if she does not have a bowel movement. This is to prevent impaction.

## 2019-08-05 NOTE — Telephone Encounter (Signed)
Motegrity 2 mg daily but again I have doubts that it will help with her constipation. I would suggest waiting to see how biofeedback works. Regarding nausea,  phenergan doesn't help, Zofran doesn't help, she cannot take reglan. Hopefully she will get the Domperidone soon. I will see if Dr. Loletha Carrow has any other suggestions ( ? Scopolamine patch?)

## 2019-08-05 NOTE — Progress Notes (Signed)
[name]  08/05/2019  HISTORY SECTION :  Chief Complaint  Patient presents with  . Wrist Pain    Lt wrist    HPI Dr. Luna Glasgow has referred the patient for possible carpal tunnel release  She is 33 years old she is coming to [name] with pain and paresthesias in the left upper extremity consistent with carpal tunnel syndrome she has had symptoms now for several years with numbness and tingling and nighttime symptoms associated with driving and nighttime pain l  Review of Systems  Constitutional: Negative for fever.  Eyes: Positive for blurred vision.  Respiratory: Negative for shortness of breath.   Cardiovascular: Negative for chest pain.  Musculoskeletal: Negative for joint pain.  Skin: Negative for rash.  Neurological: Positive for tingling and sensory change.     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   Past Surgical History:  Procedure Laterality Date  . ANAL RECTAL MANOMETRY N/A 03/25/2018   Procedure: ANO RECTAL MANOMETRY;  Surgeon: Doran Stabler, MD;  Location: [facility];  Service: Gastroenterology;  Laterality: N/A;  . CESAREAN SECTION     x 2  . CHOLECYSTECTOMY    . EYE SURGERY    . REFRACTIVE SURGERY Bilateral     Body mass index is 32.12 kg/m.   Allergies  Allergen Reactions  . Toradol [Ketorolac Tromethamine]     Cannot take d/t kidney issues   . Ceftin Rash     Current Outpatient Medications:  .  albuterol (PROAIR HFA) 108 (90 Base) MCG/ACT inhaler, INHALE 2 PUFFS BY MOUTH EVERY 6 HOURS AS NEEDED FOR SHORTNESS OF BREATH, Disp: 18 g, Rfl: 3 .  albuterol  (PROVENTIL) (2.5 MG/3ML) 0.083% nebulizer solution, Take 3 mLs (2.5 mg total) by nebulization every 6 (six) hours as needed for wheezing or shortness of breath., Disp: 150 mL, Rfl: 1 .  AMBULATORY NON FORMULARY MEDICATION, DOMPERIDONE 10 mg, twice a day. One with breakfast One with supper Dispense 60 tablets  2 refills, Disp: 60 tablet, Rfl: 2 .  amLODipine (NORVASC) 2.5 MG tablet, Take 2.5 mg by mouth daily., Disp: , Rfl:  .  busPIRone (BUSPAR) 15 MG tablet, TAKE 2 TABLETS (30 MG TOTAL) BY MOUTH 2 (TWO) TIMES DAILY., Disp: 120 tablet, Rfl: 1 .  carbamazepine (EQUETRO) 200 MG CP12 12 hr capsule, Take 2 capsules (400 mg total) by mouth 2 (two) times daily., Disp: 120 capsule, Rfl: 1 .  ciclopirox (PENLAC) 8 % solution, Apply one coat to each toenail daily. Remove weekly with polish remover., Disp: 6.6 mL, Rfl: 11 .  divalproex (DEPAKOTE ER) 500 MG 24 hr tablet, 1 nightly x5 days then 2 nightly for 5 days then 3 nightly, Disp: 90 tablet, Rfl: 0 .  enalapril (VASOTEC) 10 MG tablet, Take 10 mg by mouth daily., Disp: , Rfl:  .  Erenumab-aooe (AIMOVIG) 70 MG/ML SOAJ, Inject 70 mg into the skin every 30 (thirty) days., Disp: 1 pen, Rfl: 3 .  etonogestrel (NEXPLANON) 68 MG IMPL implant, 1 each by Subdermal route once., Disp: , Rfl:  .  fluticasone (FLONASE) 50 MCG/ACT nasal spray, Place 2 sprays into both nostrils daily., Disp: 16 g, Rfl: 2 .  furosemide (LASIX) 40 MG tablet, Take 40 mg by  mouth. Every other day, Disp: , Rfl:  .  gabapentin (NEURONTIN) 300 MG capsule, Take 1 capsule (300 mg total) by mouth 3 (three) times daily. (Patient taking differently: Take 300 mg by mouth 3 (three) times daily as needed. ), Disp: 90 capsule, Rfl: 3 .  Galcanezumab-gnlm (EMGALITY [place]), Inject into the skin., Disp: , Rfl:  .  GLUCAGON EMERGENCY 1 MG injection, AS DIRECTED AS NEEDED FOR HYPOGLYCEMIA INJECTION 30 DAYS, Disp: , Rfl:  .  HYDROcodone-acetaminophen (NORCO) 5-325 MG tablet, Take 1 tablet by mouth every 6 (six)  hours as needed for moderate pain., Disp: 30 tablet, Rfl: 0 .  Insulin Human (INSULIN PUMP) SOLN, Inject 1 each into the skin 3 times daily with meals, bedtime and 2 AM. Novolog/36-38 units/day basal; bolus sliding scale/adjusted per sliding scale by patient, Disp: , Rfl:  .  lamoTRIgine (LAMICTAL) 150 MG tablet, Take 2 tablets (300 mg total) by mouth 2 (two) times daily., Disp: 120 tablet, Rfl: 1 .  LATUDA 120 MG TABS, TAKE 1 TABLET BY MOUTH EVERY DAY, Disp: 30 tablet, Rfl: 2 .  mometasone-formoterol (DULERA) 100-5 MCG/ACT AERO, Inhale 2 puffs into the lungs 2 (two) times daily., Disp: 1 Inhaler, Rfl: 5 .  montelukast (SINGULAIR) 10 MG tablet, Take 1 tablet (10 mg total) by mouth at bedtime., Disp: 90 tablet, Rfl: 3 .  NOVOLOG 100 UNIT/ML injection, Inject 36-150 Units into the skin. Via insulin pump per sliding scale, Disp: , Rfl:  .  Pramipexole Dihydrochloride 0.75 MG TB24, Take 1 tablet (0.75 mg total) by mouth every evening., Disp: 30 tablet, Rfl: 1 .  promethazine (PHENERGAN) 25 MG tablet, Take 0.5 tablets (12.5 mg total) by mouth every 8 (eight) hours as needed for nausea or vomiting., Disp: 30 tablet, Rfl: 1 .  SUMAtriptan (IMITREX) 100 MG tablet, Take 1 tablet (100 mg total) by mouth every 2 (two) hours as needed for migraine. May repeat in 2 hours if headache persists or recurs., Disp: 10 tablet, Rfl: 0 .  traZODone (DESYREL) 50 MG tablet, Take 1 tablet (50 mg total) by mouth at bedtime., Disp: 30 tablet, Rfl: 2 .  Ubrogepant (UBRELVY) 100 MG TABS, Take 100 mg by mouth., Disp: , Rfl:  .  Urea 40 % LOTN, Apply 1 application topically daily. Apply only to calluses, Disp: 325 mL, Rfl: 0 .  acetaminophen (TYLENOL) 500 MG tablet, Take 500 mg by mouth daily as needed for mild pain., Disp: , Rfl:  .  gabapentin (NEURONTIN) 400 MG capsule, Take 1 capsule (400 mg total) by mouth 3 (three) times daily., Disp: 42 capsule, Rfl: 1 .  topiramate (TOPAMAX) 25 MG tablet, Take 25 mg by mouth 2 (two) times  daily. Taper dose until to 200mg  bid, Disp: , Rfl:    PHYSICAL EXAM SECTION: 1) BP (!) 144/81   Pulse (!) 101   Temp (!) 97.2 F (36.2 C)   Ht 5\' 6"  (1.676 m)   Wt 199 lb (90.3 kg)   BMI 32.12 kg/m   Body mass index is 32.12 kg/m. General appearance: Well-developed well-nourished no gross deformities  2) Cardiovascular normal pulse and perfusion in the UPPER  extremities normal color without edema  3) Neurologically deep tendon reflexes are equal and normal, no sensation loss or deficits no pathologic reflexes  4) Psychological: Awake alert and oriented x3 mood and affect normal  5) Skin no lacerations or ulcerations no nodularity no palpable masses, no erythema or nodularity  6) Musculoskeletal:   The right hand is  asymptomatic the alignment is normal there is no tenderness or swelling range of motion is normal no instability muscle tone is normal  The left hand is symptomatic alignment is normal skin looks good tenderness over the carpal tunnel pain with pressure over the carpal tunnel Phalen's positive Tinel's negative sensory loss in the index long ring finger thumb seems okay strength seems normal   MEDICAL DECISION SECTION:  Encounter Diagnosis  Name Primary?  . Carpal tunnel syndrome, left upper limb Yes    Imaging Nerve conduction study EMG was performed Assessment & Plan: Visit Diagnoses:  1. Paresthesia of skin     Plan: Impression: The above electrodiagnostic study is ABNORMAL and reveals evidence of a moderate to severe left median nerve entrapment at the wrist (carpal tunnel syndrome) affecting sensory and motor components.    There is no significant electrodiagnostic evidence of any other focal nerve entrapment, brachial plexopathy or cervical radiculopathy.     Plan:  (Rx., Inj., surg., Frx, MRI/CT, XR:2)  The procedure has been fully reviewed with the patient; The risks and benefits of surgery have been discussed and explained and understood.  Alternative treatment has also been reviewed, questions were encouraged and answered. The postoperative plan is also been reviewed.   She wishes to proceed with a left carpal tunnel release open technique  4:50 PM Arther Abbott, MD  08/05/2019

## 2019-08-06 ENCOUNTER — Other Ambulatory Visit: Payer: Self-pay

## 2019-08-06 MED ORDER — MOTEGRITY 2 MG PO TABS: 2.0000 mg | ORAL_TABLET | ORAL | 1 refills | Status: DC

## 2019-08-06 NOTE — Telephone Encounter (Signed)
Reviewed instructions from yesterday.

## 2019-08-06 NOTE — Telephone Encounter (Signed)
Pt would like to update pharmacy [facility] in [place].  She stated that she was expecting a call back regarding whether to talk magnesium citrate.

## 2019-08-12 DIAGNOSIS — E109 Type 1 diabetes mellitus without complications: Secondary | ICD-10-CM | POA: Diagnosis not present

## 2019-08-13 ENCOUNTER — Encounter: Payer: Self-pay | Admitting: Family Medicine

## 2019-08-14 ENCOUNTER — Telehealth: Payer: Self-pay | Admitting: Nurse Practitioner

## 2019-08-15 ENCOUNTER — Telehealth: Payer: Self-pay | Admitting: Psychiatry

## 2019-08-15 ENCOUNTER — Other Ambulatory Visit: Payer: Self-pay

## 2019-08-15 MED ORDER — PANTOPRAZOLE SODIUM 20 MG PO TBEC: 20.0000 mg | DELAYED_RELEASE_TABLET | Freq: Every day | ORAL | 3 refills | Status: DC

## 2019-08-15 NOTE — Telephone Encounter (Signed)
Pt called to report new med she started having a lot of side effects mood swings, sounds in ears, suicidal thoughts, tremors. Causing issues with husband

## 2019-08-15 NOTE — Telephone Encounter (Signed)
Spoke with the patient. Appointment moved to 09/16/19. Advised of the [facility]. Verbal reinforcement of instructions to take 30 minutes ac breakfast.

## 2019-08-15 NOTE — Telephone Encounter (Signed)
QTc 461 last year. Ok, let's try protonix 20 mg q am 30 min before breakfast. Beth please make sure she has follow up with Dr Loletha Carrow. Needs to be seen based on the number of phone calls receiving from her. Thanks

## 2019-08-15 NOTE — Telephone Encounter (Signed)
Spoke with the patient. She is experiencing "constant indigestion" and "chest pressure" "Ialmost don't want to eat because of this indigestion." She has tried Pepto-Bismol without relief, "doesn't do much." She reports daily use of TUMS without sufficient relief. States she was on Protonix "about 8 years ago" and remembers it being helpful. FYI has her pelvic PT scheduled for next week. Following through with that recommendation.

## 2019-08-15 NOTE — Telephone Encounter (Signed)
RTC  Pt called to report new med she started having a lot of side effects mood swings, sounds in ears, suicidal thoughts, tremors. Causing issues with husband She is currently taking Depakote ER 1500 mg nightly. She commits to safety.  The only symptom directly attributable to Depakote would be the tremor.  However its presence would indicate she is not going to be able to tolerate a higher dose of Depakote to control her mood symptoms.  Therefore we will wean the Depakote.  In order to control the mood symptoms we will increase the Equetro at the evening dose which should not cause significant side effects problems during the day but help with mood stability.  This increase should also make it easier to wean the Depakote quickly.  She agrees to this plan.  She will call back if the suicidal thoughts become too intense to tolerate.  Reduce Depakote ER 500 mg tablets to 1 a night for 4 nights then stop Increase Equetro 200 mg capsules to 2 in the morning and 3 nightly.  [name], MD, DFAPA

## 2019-08-17 ENCOUNTER — Other Ambulatory Visit: Payer: Self-pay | Admitting: Psychiatry

## 2019-08-17 DIAGNOSIS — F314 Bipolar disorder, current episode depressed, severe, without psychotic features: Secondary | ICD-10-CM

## 2019-08-19 ENCOUNTER — Telehealth: Payer: Self-pay | Admitting: Psychiatry

## 2019-08-19 ENCOUNTER — Other Ambulatory Visit: Payer: Self-pay

## 2019-08-19 MED ORDER — CARBAMAZEPINE ER 200 MG PO CP12: 400.0000 mg | ORAL_CAPSULE | Freq: Two times a day (BID) | ORAL | 1 refills | Status: DC

## 2019-08-19 NOTE — Telephone Encounter (Signed)
Pt was taken off of Depakote and her [place] was increased,  and has started to notice that her hair is falling out. She wants to know if maybe this has something to do with the med change? Pt also needs refill on [facility] [place].

## 2019-08-19 NOTE — Telephone Encounter (Signed)
Hair loss can be a temporary side effect from Depakote.  She should start a multivitamin with minerals plus and biotin 1 daily and that will help speed the resolution of that side effect.  She stopped the Depakote so it will gradually resolve.

## 2019-08-20 ENCOUNTER — Other Ambulatory Visit: Payer: Self-pay

## 2019-08-20 MED ORDER — CARBAMAZEPINE ER 200 MG PO CP12: ORAL_CAPSULE | ORAL | 2 refills | Status: DC

## 2019-08-20 NOTE — Telephone Encounter (Signed)
Spoke with [name] and she verbalized understanding, did update her dose increase on Equetro 200 mg take 2 in the am and 3 at bedtime. #150 with 2 additional refills

## 2019-08-21 ENCOUNTER — Ambulatory Visit: Payer: Medicare Other | Admitting: Physical Therapy

## 2019-08-28 ENCOUNTER — Ambulatory Visit: Payer: Medicare Other | Attending: Nurse Practitioner | Admitting: Physical Therapy

## 2019-08-28 ENCOUNTER — Encounter: Payer: Self-pay | Admitting: Physical Therapy

## 2019-08-28 ENCOUNTER — Telehealth: Payer: Self-pay

## 2019-08-28 ENCOUNTER — Other Ambulatory Visit: Payer: Self-pay

## 2019-08-28 DIAGNOSIS — K5909 Other constipation: Secondary | ICD-10-CM

## 2019-08-28 DIAGNOSIS — R279 Unspecified lack of coordination: Secondary | ICD-10-CM

## 2019-08-28 DIAGNOSIS — M6281 Muscle weakness (generalized): Secondary | ICD-10-CM

## 2019-08-28 NOTE — Therapy (Signed)
[facility] [address], [place], [place], [address] Phone: [phone]   Fax:  [phone]  Physical Therapy Evaluation  Patient Details  Name: [name] MRN: [id number] Date of Birth: 07-05-1986 Referring Provider (PT): Dr. Mallie Snooks   Encounter Date: 08/28/2019  PT End of Session - 08/28/19 1230    Visit Number  1    Date for PT Re-Evaluation  10/23/19    Authorization Type  medicare/medicaid    Authorization Time Period  --    Authorization - Visit Number  0    Authorization - Number of Visits  0    PT Start Time  1205   came late due to being lost   PT Stop Time  1230    PT Time Calculation (min)  25 min    Activity Tolerance  Patient tolerated treatment well    Behavior During Therapy  [facility] for tasks assessed/performed       Past Medical History:  Diagnosis Date  . Anxiety   . Asthma   . Asthma 11/10/2013  . Bipolar 1 disorder ([place])   . Depression   . Diabetic gastroparesis ([place])   . Diabetic neuropathy, type I diabetes mellitus ([place])   . Gastroparesis   . Headache(784.0)   . Hypertension   . Kidney stones   . Polyneuropathy in diabetes(357.2)   . 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[place])   . Tachycardia    baseline tachycardia   . 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 ANAL RECTAL MANOMETRY N/A 03/25/2018   Procedure: ANO RECTAL MANOMETRY;  Surgeon: Doran Stabler, MD;  Location: [facility];  Service: Gastroenterology;  Laterality: N/A;  . CESAREAN SECTION     x 2  . CHOLECYSTECTOMY    . EYE SURGERY    . REFRACTIVE SURGERY Bilateral     There were no vitals filed for this visit.   Subjective Assessment - 08/28/19 1207    Subjective  Patient reports years of issues with constipation. Patient had gastroparesis and lost alot of weight. Patient had her gallbladder removed.  Patient has 1-2 bowel movements per week. Patient has to strain to have a bowel movement. Patient has hemorroids and will bleed. Patient has a small amount of stool and is not complete. Type 1 BM.    Patient Stated Goals  improve toileting    Currently in Pain?  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 Mallie Snooks    Onset Date/Surgical Date  --   chronic   Prior Therapy  none      Precautions   Precautions  None      Restrictions   Weight Bearing Restrictions  No      Balance Screen   Has the patient fallen in the past 6 months  No    Has the patient had a decrease in activity level because of a fear of falling?   No    Is the patient reluctant to leave their home because of a fear of falling?   No      Home Film/video editor residence      Prior Function   Level of Independence  Independent    Vocation  On disability    Leisure  no exercise; patient has a 34 year old      Cognition   Overall Cognitive Status  Within Functional Limits for tasks assessed      Posture/Postural Control   Posture/Postural Control  Postural limitations    Postural Limitations  Rounded Shoulders;Forward head      ROM / Strength   AROM / PROM / Strength  AROM;PROM;Strength      Palpation   SI assessment   left ilium is rotated posteriorly                 Objective measurements completed on examination: See above findings.    Pelvic Floor Special Questions - 08/28/19 0001    Prior  Pregnancies  Yes    Number of C-Sections  2    Currently Sexually Active  Yes    Is this Painful  No    Urinary Leakage  Yes    Activities that cause leaking  Coughing;Sneezing    Fecal incontinence  No   constipation   Pelvic Floor Internal Exam  Patient confirms identification and approves PT to assess pelvic floor and treatment    Exam Type  Rectal    Palpation  tenderness located on the left levator ani and obturator internist    Strength  weak squeeze, no lift    Strength # of seconds  2                 PT Short Term Goals - 08/28/19 1243      PT SHORT TERM GOAL #1   Title  independent with initial HEP    Time  4    Period  Weeks    Status  New    Target Date  10/23/19      PT SHORT TERM GOAL #2   Title  understand how to perform abdominal massage to improve peristalic motion of the intestines    Time  4    Period  Weeks    Status  New    Target Date  09/25/19      PT SHORT TERM GOAL #3   Title  understand correct toileting technique to push the stool out    Time  4    Period  Weeks    Status  New    Target Date  09/25/19      PT SHORT TERM GOAL #4   Title  able to demonstrate diaphragmatic breathing to relax the pelvic floor with toileting    Time  4    Period  Weeks    Status  New    Target Date  09/25/19        PT Long Term Goals - 08/28/19 1255      PT LONG TERM GOAL #1   Title  independent with advanced HEP    Time  8    Period  Weeks  Status  New    Target Date  10/23/19      PT LONG TERM GOAL #2   Title  able to have a complete bowel movement due to ability to relax the pelvic floor and improved peristalic motion of the intestines    Time  8    Period  Weeks    Status  New    Target Date  10/23/19      PT LONG TERM GOAL #3   Title  pelvic floor strength >/= 4/5 so she is able to push the stool out with greater ease    Time  8    Period  Weeks    Status  New    Target Date  10/23/19      PT LONG TERM GOAL #4   Title   straining to have a bowel movement improved >/= 50% due to improve pelvic floor muscle coordination    Time  8    Period  Weeks    Status  New    Target Date  10/23/19      PT LONG TERM GOAL #5   Title  abdominal pain improved >/= 80% due improved toileting    Time  8    Period  Weeks    Status  New    Target Date  10/23/19      PT LONG TERM GOAL #6   Title  --             Plan - 08/28/19 1231    Clinical Impression Statement  Patient is a 33 year old female with chronic constipation for many years. Patient reports abdominal pain that ranges from 3-8/10 and refers into her back intermittently. Patient has 1- 2 bowel movements per week that are Type1. Patient has to strain to have a bowel movement. Patient is not able to have a complete bowel movement. Pelvic floor strength is 2/5 with no anterior movement of the puborectalis, Patient has palpable tenderness located on the left levator ani and obturator internist. Patient reports she will leak urine with coughing and sneezing. Patient will benefit from skilled therapy to improve pelvic floor coordination to improve toileting.    Personal Factors and Comorbidities  Age;Fitness;Time since onset of injury/illness/exacerbation    Comorbidities  HTN, Type I DM, HTN, Asthma, Depression, Bipolar Disorder    Examination-Activity Limitations  Toileting;Continence    Stability/Clinical Decision Making  Evolving/Moderate complexity    Clinical Decision Making  Low    Rehab Potential  Good    PT Frequency  1x / week    PT Duration  12 weeks    PT Treatment/Interventions  Biofeedback;Functional mobility training;Therapeutic activities;Therapeutic exercise;Patient/family education;Neuromuscular re-education;Manual techniques;Dry needling    PT Next Visit Plan  abdominal massage, toileting, hip stretches, massage the left pelvic floor    PT Home Exercise Plan  --    Consulted and Agree with Plan of Care  Patient       Patient will benefit  from skilled therapeutic intervention in order to improve the following deficits and impairments:  Decreased coordination, Increased fascial restricitons, Decreased strength  Visit Diagnosis: Muscle weakness (generalized) - Plan: PT plan of care cert/re-cert  Unspecified lack of coordination - Plan: PT plan of care cert/re-cert  Chronic constipation - Plan: PT plan of care cert/re-cert     Problem List Patient Active Problem List   Diagnosis Date Noted  . Pseudophakia of left eye 11/04/2018  . Constipation   . CKD (chronic kidney  disease), stage III 01/01/2017  . Diabetic nephropathy associated with type 1 diabetes mellitus ([place]) 03/01/2016  . Bipolar disorder in partial remission ([place]) 02/01/2016  . Chronic hypertension 02/01/2016  . DM retinopathy ([place])   . Encounter for preconception consultation   . After-cataract obscuring vision, left 09/22/2014  . MDD (major depressive disorder), severe ([place]) 05/31/2014  . Drug overdose, intentional ([place]) 03/27/2014  . Poisoning by drug or medicinal substance 03/27/2014  . Asthma 11/10/2013  . Colles' fracture of left radius 10/11/2013  . Type I diabetes mellitus with complication, uncontrolled ([place]) 10/11/2013  . MDD (major depressive disorder), recurrent episode, severe ([place]) 09/26/2013  . Generalized anxiety disorder 09/26/2013  . Aphakia, right eye 09/14/2012  . Retinal detachment, tractional, left eye 06/11/2012  . Anemia 06/07/2012  . Eczema 06/07/2012  . Renal calculi 06/07/2012  . Hyperglycemia 04/30/2012  . Diabetic retinopathy associated with type 1 diabetes mellitus ([place]) 04/30/2012  . Polyneuropathy in diabetes(357.2) 04/30/2012  . H/O insertion of insulin pump 04/30/2012  . Polyneuropathy in diabetes ([place]) 04/30/2012  . CHOLELITHIASIS 08/10/2010  . ATRIAL TACHYCARDIA 08/08/2010  . Gastroparesis 08/08/2010  . LIVER FUNCTION TESTS, ABNORMAL, HX OF 08/08/2010  . Type 1 diabetes mellitus with ketoacidosis, uncontrolled  ([place]) 08/02/2010  . BOWEL INTUSSUSCEPTION 08/02/2010  . PANCREATITIS, ACUTE, HX OF 08/02/2010  . ABDOMINAL PAIN, LEFT UPPER QUADRANT 08/01/2010    Earlie Counts, PT 08/28/19 1:24 PM   [facility] [address] [place], [place], [address] Phone: [phone]   Fax:  [phone]  Name: Brycelyn Rak MRN: [id number] Date of Birth: 1985/10/17

## 2019-08-28 NOTE — Telephone Encounter (Signed)
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 [phone]. Fax sent to pharmacy.

## 2019-08-29 DIAGNOSIS — E103513 Type 1 diabetes mellitus with proliferative diabetic retinopathy with macular edema, bilateral: Secondary | ICD-10-CM | POA: Diagnosis not present

## 2019-08-29 DIAGNOSIS — Z9889 Other specified postprocedural states: Secondary | ICD-10-CM | POA: Insufficient documentation

## 2019-08-29 DIAGNOSIS — H2701 Aphakia, right eye: Secondary | ICD-10-CM | POA: Diagnosis not present

## 2019-08-29 DIAGNOSIS — H532 Diplopia: Secondary | ICD-10-CM | POA: Diagnosis not present

## 2019-08-29 DIAGNOSIS — H50111 Monocular exotropia, right eye: Secondary | ICD-10-CM | POA: Diagnosis not present

## 2019-09-01 ENCOUNTER — Other Ambulatory Visit: Payer: Self-pay

## 2019-09-01 ENCOUNTER — Ambulatory Visit: Payer: Medicare Other | Admitting: Physical Therapy

## 2019-09-01 ENCOUNTER — Encounter: Payer: Self-pay | Admitting: Physical Therapy

## 2019-09-01 DIAGNOSIS — R279 Unspecified lack of coordination: Secondary | ICD-10-CM | POA: Diagnosis not present

## 2019-09-01 DIAGNOSIS — K5909 Other constipation: Secondary | ICD-10-CM | POA: Diagnosis not present

## 2019-09-01 DIAGNOSIS — E109 Type 1 diabetes mellitus without complications: Secondary | ICD-10-CM | POA: Diagnosis not present

## 2019-09-01 DIAGNOSIS — M6281 Muscle weakness (generalized): Secondary | ICD-10-CM

## 2019-09-01 NOTE — Patient Instructions (Signed)
About Abdominal Massage  Abdominal massage, also called external colon massage, is a self-treatment circular massage technique that can reduce and eliminate gas and ease constipation. The colon naturally contracts in waves in a clockwise direction starting from inside the right hip, moving up toward the ribs, across the belly, and down inside the left hip.  When you perform circular abdominal massage, you help stimulate your colon's normal wave pattern of movement called peristalsis.  It is most beneficial when done after eating.  Positioning You can practice abdominal massage with oil while lying down, or in the shower with soap.  Some people find that it is just as effective to do the massage through clothing while sitting or standing.  How to Massage Start by placing your finger tips or knuckles on your right side, just inside your hip bone.  . Make small circular movements while you move upward toward your rib cage.   . Once you reach the bottom right side of your rib cage, take your circular movements across to the left side of the bottom of your rib cage.  . Next, move downward until you reach the inside of your left hip bone.  This is the path your feces travel in your colon. . Continue to perform your abdominal massage in this pattern for 10 minutes each day.     You can apply as much pressure as is comfortable in your massage.  Start gently and build pressure as you continue to practice.  Notice any areas of pain as you massage; areas of slight pain may be relieved as you massage, but if you have areas of significant or intense pain, consult with your healthcare provider.  Other Considerations . General physical activity including bending and stretching can have a beneficial massage-like effect on the colon.  Deep breathing can also stimulate the colon because breathing deeply activates the same nervous system that supplies the colon.   . Abdominal massage should always be used in  combination with a bowel-conscious diet that is high in the proper type of fiber for you, fluids (primarily water), and a regular exercise program. Toileting Techniques for Bowel Movements (Defecation) Using your belly (abdomen) and pelvic floor muscles to have a bowel movement is usually instinctive.  Sometimes people can have problems with these muscles and have to relearn proper defecation (emptying) techniques.  If you have weakness in your muscles, organs that are falling out, decreased sensation in your pelvis, or ignore your urge to go, you may find yourself straining to have a bowel movement.  You are straining if you are: . holding your breath or taking in a huge gulp of air and holding it  . keeping your lips and jaw tensed and closed tightly . turning red in the face because of excessive pushing or forcing . developing or worsening your  hemorrhoids . getting faint while pushing . not emptying completely and have to defecate many times a day  If you are straining, you are actually making it harder for yourself to have a bowel movement.  Many people find they are pulling up with the pelvic floor muscles and closing off instead of opening the anus. Due to lack pelvic floor relaxation and coordination the abdominal muscles, one has to work harder to push the feces out.  Many people have never been taught how to defecate efficiently and effectively.  Notice what happens to your body when you are having a bowel movement.  While you are sitting on the   the toilet pay attention to the following areas: . Jaw and mouth position . Angle of your hips   . Whether your feet touch the ground or not . Arm placement  . Spine position . Waist . Belly tension . Anus (opening of the anal canal)  An Evacuation/Defecation Plan   Here are the 4 basic points:  1. Lean forward enough for your elbows to rest on your knees 2. Support your feet on the floor or use a low stool if your feet don't touch the  floor  3. Push out your belly as if you have swallowed a beach ball--you should feel a widening of your waist 4. Open and relax your pelvic floor muscles, rather than tightening around the anus       The following conditions my require modifications to your toileting posture:  . If you have had surgery in the past that limits your back, hip, pelvic, knee or ankle flexibility . Constipation   Your healthcare practitioner may make the following additional suggestions and adjustments:  1) Sit on the toilet  a) Make sure your feet are supported. b) Notice your hip angle and spine position--most people find it effective to lean forward or raise their knees, which can help the muscles around the anus to relax  c) When you lean forward, place your forearms on your thighs for support  2) Relax suggestions a) Breath deeply in through your nose and out slowly through your mouth as if you are smelling the flowers and blowing out the candles. b) To become aware of how to relax your muscles, contracting and releasing muscles can be helpful.  Pull your pelvic floor muscles in tightly by using the image of holding back gas, or closing around the anus (visualize making a circle smaller) and lifting the anus up and in.  Then release the muscles and your anus should drop down and feel open. Repeat 5 times ending with the feeling of relaxation. c) Keep your pelvic floor muscles relaxed; let your belly bulge out. d) The digestive tract starts at the mouth and ends at the anal opening, so be sure to relax both ends of the tube.  Place your tongue on the roof of your mouth with your teeth separated.  This helps relax your mouth and will help to relax the anus at the same time.  3) Empty (defecation) a) Keep your pelvic floor and sphincter relaxed, then bulge your anal muscles.  Make the anal opening wide.  b) Stick your belly out as if you have swallowed a beach ball. c) Make your belly wall hard using your  belly muscles while continuing to breathe. Doing this makes it easier to open your anus. d) Breath out and give a grunt (or try using other sounds such as ahhhh, shhhhh, ohhhh or grrrrrrr).  4) Finish a) As you finish your bowel movement, pull the pelvic floor muscles up and in.  This will leave your anus in the proper place rather than remaining pushed out and down. If you leave your anus pushed out and down, it will start to feel as though that is normal and give you incorrect signals about needing to have a bowel movement. [facility] [address], [address] [place], [place] [address] Phone # [phone] Fax [phone]   

## 2019-09-01 NOTE — Therapy (Signed)
[facility] [address] [place], [place], [address] Phone: [phone]   Fax:  [phone]  Physical Therapy Treatment  Patient Details  Name: [name] MRN: [id number] Date of Birth: [birth year] Referring Provider (PT): Dr. Mallie Snooks   Encounter Date: 09/01/2019  PT End of Session - 09/01/19 1455    Visit Number  2    Date for PT Re-Evaluation  10/23/19    Authorization Type  medicare/medicaid    PT Start Time  1456   came late   PT Stop Time  1525    PT Time Calculation (min)  29 min    Activity Tolerance  Patient tolerated treatment well    Behavior During Therapy  [facility] for tasks assessed/performed       Past Medical History:  Diagnosis Date  . Anxiety   . Asthma   . Asthma 11/10/2013  . Bipolar 1 disorder ([place])   . Depression   . Diabetic gastroparesis ([place])   . Diabetic neuropathy, type I diabetes mellitus ([place])   . Gastroparesis   . Headache(784.0)   . Hypertension   . Kidney stones   . Polyneuropathy in diabetes(357.2)   . 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[place])   . Tachycardia    baseline tachycardia   . 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 ANAL RECTAL MANOMETRY N/A 03/25/2018   Procedure: ANO RECTAL MANOMETRY;  Surgeon: Doran Stabler, MD;  Location: [facility];  Service: Gastroenterology;  Laterality: N/A;  . CESAREAN SECTION     x 2  . CHOLECYSTECTOMY    . EYE SURGERY    . REFRACTIVE SURGERY Bilateral     There were no vitals filed for this visit.  Subjective Assessment - 09/01/19 1456    Subjective  I had eye surgery so ono lifting or straining.    Patient Stated Goals  improve toileting                       [place] Adult PT Treatment/Exercise - 09/01/19 0001      Therapeutic Activites    Therapeutic Activities  Other Therapeutic Activities    Other Therapeutic Activities  education on correct toileting technique to relax the pelvic floor with diaphragmatic breathing, leaning forward, keep her face relaxed, and breathing out to relax the pelvic floor      Manual Therapy   Manual Therapy  Soft tissue mobilization    Soft tissue mobilization  circular massage to the abdoment to promote peristalic motion of the intestines to improve bowel movements; STW to diaphgram to improve breathing for toileting             PT Education - 09/01/19 1516    Education Details  education on abdominal massage and correct toileting technique    Methods  Explanation;Demonstration;Handout    Comprehension  Verbalized understanding;Returned demonstration       PT Short Term Goals - 08/28/19 1243      PT SHORT TERM GOAL #1   Title  independent with initial HEP    Time  4    Period  Weeks    Status  New    Target Date  10/23/19      PT SHORT TERM GOAL #2   Title  understand how to perform abdominal massage to improve peristalic motion of the intestines  Time  4    Period  Weeks    Status  New    Target Date  09/25/19      PT SHORT TERM GOAL #3   Title  understand correct toileting technique to push the stool out    Time  4    Period  Weeks    Status  New    Target Date  09/25/19      PT SHORT TERM GOAL #4   Title  able to demonstrate diaphragmatic breathing to relax the pelvic floor with toileting    Time  4    Period  Weeks    Status  New    Target Date  09/25/19        PT Long Term Goals - 08/28/19 1255      PT LONG TERM GOAL #1   Title  independent with advanced HEP    Time  8    Period  Weeks    Status  New    Target Date  10/23/19      PT LONG TERM GOAL #2   Title  able to have a complete bowel movement due to ability to relax the pelvic floor and improved peristalic motion of the intestines    Time  8    Period  Weeks    Status  New    Target Date  10/23/19      PT LONG TERM GOAL #3   Title  pelvic  floor strength >/= 4/5 so she is able to push the stool out with greater ease    Time  8    Period  Weeks    Status  New    Target Date  10/23/19      PT LONG TERM GOAL #4   Title  straining to have a bowel movement improved >/= 50% due to improve pelvic floor muscle coordination    Time  8    Period  Weeks    Status  New    Target Date  10/23/19      PT LONG TERM GOAL #5   Title  abdominal pain improved >/= 80% due improved toileting    Time  8    Period  Weeks    Status  New    Target Date  10/23/19      PT LONG TERM GOAL #6   Title  --            Plan - 09/01/19 1457    Clinical Impression Statement  Patient just had eye surgery so today she was not allowed to strain or lift. Patient was educated on abdominal massage and toileting technque to improve constipation. Patient is not allowed to strain to have a bowel movement due to her eye. Patient was able to demonstrate correct toileting technque. Patient will benefit from skilled therapy to improve pelvic floor coordination to improve toileting.    Personal Factors and Comorbidities  Age;Fitness;Time since onset of injury/illness/exacerbation    Comorbidities  HTN, Type I DM, HTN, Asthma, Depression, Bipolar Disorder    Examination-Activity Limitations  Toileting;Continence    Stability/Clinical Decision Making  Evolving/Moderate complexity    Rehab Potential  Good    PT Frequency  1x / week    PT Duration  12 weeks    PT Treatment/Interventions  Biofeedback;Functional mobility training;Therapeutic activities;Therapeutic exercise;Patient/family education;Neuromuscular re-education;Manual techniques;Dry needling    PT Next Visit Plan  abdominal massage, review toileting, hip stretches, massage the left pelvic floor  Recommended Other Services  MD signed initial eval    Consulted and Agree with Plan of Care  Patient       Patient will benefit from skilled therapeutic intervention in order to improve the following  deficits and impairments:  Decreased coordination, Increased fascial restricitons, Decreased strength  Visit Diagnosis: Muscle weakness (generalized)  Unspecified lack of coordination  Chronic constipation     Problem List Patient Active Problem List   Diagnosis Date Noted  . Pseudophakia of left eye 11/04/2018  . Constipation   . CKD (chronic kidney disease), stage III 01/01/2017  . Diabetic nephropathy associated with type 1 diabetes mellitus ([place]) 03/01/2016  . Bipolar disorder in partial remission ([place]) 02/01/2016  . Chronic hypertension 02/01/2016  . DM retinopathy ([place])   . Encounter for preconception consultation   . After-cataract obscuring vision, left 09/22/2014  . MDD (major depressive disorder), severe ([place]) 05/31/2014  . Drug overdose, intentional ([place]) 03/27/2014  . Poisoning by drug or medicinal substance 03/27/2014  . Asthma 11/10/2013  . Colles' fracture of left radius 10/11/2013  . Type I diabetes mellitus with complication, uncontrolled ([place]) 10/11/2013  . MDD (major depressive disorder), recurrent episode, severe ([place]) 09/26/2013  . Generalized anxiety disorder 09/26/2013  . Aphakia, right eye 09/14/2012  . Retinal detachment, tractional, left eye 06/11/2012  . Anemia 06/07/2012  . Eczema 06/07/2012  . Renal calculi 06/07/2012  . Hyperglycemia 04/30/2012  . Diabetic retinopathy associated with type 1 diabetes mellitus ([place]) 04/30/2012  . Polyneuropathy in diabetes(357.2) 04/30/2012  . H/O insertion of insulin pump 04/30/2012  . Polyneuropathy in diabetes ([place]) 04/30/2012  . CHOLELITHIASIS 08/10/2010  . ATRIAL TACHYCARDIA 08/08/2010  . Gastroparesis 08/08/2010  . LIVER FUNCTION TESTS, ABNORMAL, HX OF 08/08/2010  . Type 1 diabetes mellitus with ketoacidosis, uncontrolled ([place]) 08/02/2010  . BOWEL INTUSSUSCEPTION 08/02/2010  . PANCREATITIS, ACUTE, HX OF 08/02/2010  . ABDOMINAL PAIN, LEFT UPPER QUADRANT 08/01/2010    Earlie Counts, PT 09/01/19 3:29  PM   [facility] [address], [place], [place], [address] Phone: [phone]   Fax:  [phone]  Name: [name] MRN: [id number] Date of Birth: [birth year]

## 2019-09-02 ENCOUNTER — Ambulatory Visit: Payer: Medicaid Other | Admitting: Podiatry

## 2019-09-02 DIAGNOSIS — I10 Essential (primary) hypertension: Secondary | ICD-10-CM | POA: Diagnosis not present

## 2019-09-02 DIAGNOSIS — F319 Bipolar disorder, unspecified: Secondary | ICD-10-CM | POA: Diagnosis not present

## 2019-09-02 DIAGNOSIS — E1021 Type 1 diabetes mellitus with diabetic nephropathy: Secondary | ICD-10-CM | POA: Diagnosis not present

## 2019-09-02 DIAGNOSIS — G43701 Chronic migraine without aura, not intractable, with status migrainosus: Secondary | ICD-10-CM | POA: Diagnosis not present

## 2019-09-02 NOTE — Patient Instructions (Signed)
[name]  09/02/2019     @PREFPERIOPPHARMACY @   Your procedure is scheduled on  09/11/2019  Report to [facility] at  [place].M.  Call this number if you have problems the morning of surgery:  [phone]   Remember:  Do not eat or drink after midnight.                         Take these medicines the morning of surgery with A SIP OF WATER  A,lodipine, buspar, carbemazipine, gabapentin, lamictal, protonix, phenergan(if needed), topiamate. Take insulin pump down to your basal rate before bed.    Do not wear jewelry, make-up or nail polish.  Do not wear lotions, powders, or perfumes, or deodorant. Please brush your teeth.  Do not shave 48 hours prior to surgery.  Men may shave face and neck.  Do not bring valuables to the hospital.  [facility] is not responsible for any belongings or valuables.  Contacts, dentures or bridgework may not be worn into surgery.  Leave your suitcase in the car.  After surgery it may be brought to your room.  For patients admitted to the hospital, discharge time will be determined by your treatment team.  Patients discharged the day of surgery will not be allowed to drive home.   Name and phone number of your driver:   family Special instructions:  Dial insulin pump down to basal rate and do not take any other medications for diabetes the morning of your surgery.  Please read over the following fact sheets that you were given. Surgical Site Infection Prevention, Anesthesia Post-op Instructions and Care and Recovery After Surgery       Open Carpal Tunnel Release, Care After This sheet gives you information about how to care for yourself after your procedure. Your health care provider may also give you more specific instructions. If you have problems or questions, contact your health care provider. What can I expect after the procedure? After the procedure, it is common to have:  Wrist stiffness.  Bruising. Follow these  instructions at home: Bathing  Do not take baths, swim, or use a hot tub until your health care provider approves. Ask your health care provider if you may take showers.  Keep your bandage (dressing) dry until your health care provider says it can be removed. If you have a splint or brace:  Wear the splint or brace as told by your health care provider. You may need to wear it for 2-3 weeks. Remove it only as told by your health care provider.  Loosen the splint or brace if your fingers tingle, become numb, or turn cold and blue.  Keep the splint or brace clean.  If the splint or brace is not waterproof: ? Do not let it get wet. ? Cover it with a watertight covering when you take a bath or a shower. Incision care   Follow instructions from your health care provider about how to take care of your incision. Make sure you: ? Wash your hands with soap and water before you change your dressing. If soap and water are not available, use hand sanitizer. ? Change your dressing as told by your health care provider. ? Leave stitches (sutures), skin glue, or adhesive strips in place. These skin closures may need to stay in place for 2 weeks or longer. If adhesive strip edges start to loosen and curl up,  you may trim the loose edges. Do not remove adhesive strips completely unless your health care provider tells you to do that.  Check your incision area every day for signs of infection. Check for: ? Redness, swelling, or pain. ? Fluid or blood. ? Warmth. ? Pus or a bad smell. Managing pain, stiffness, and swelling   If directed, put ice on the affected area. ? If you have a removable splint or brace, remove it as told by your health care provider. ? Put ice in a plastic bag. ? Place a towel between your skin and the bag. ? Leave the ice on for 20 minutes, 2-3 times a day.  Move your fingers often to avoid stiffness and to lessen swelling.  Raise (elevate) your wrist above the level of  your heart while you are sitting or lying down. Activity  Do not drive until your health care provider approves.  Do not drive or use heavy machinery while taking prescription pain medicine.  Return to your normal activities as told by your health care provider. Avoid activities that cause pain.  If physical therapy was prescribed, do exercises as told by your therapist. Physical therapy can help you heal faster and regain movement. General instructions  Take over-the-counter and prescription medicines only as told by your health care provider.  If you are taking prescription pain medicine, take actions to prevent or treat constipation. Your health care provider may recommend that you: ? Drink enough fluid to keep your urine pale yellow. ? Eat foods that are high in fiber, such as fresh fruits and vegetables, whole grains, and beans. ? Limit foods that are high in fat and processed sugars, such as fried or sweet foods. ? Take an over-the-counter or prescription medicine for constipation.  Do not use any products that contain nicotine or tobacco, such as cigarettes and e-cigarettes. If you need help quitting, ask your health care provider.  Keep all follow-up visits as told by your health care provider and physical therapist. This is important. Contact a health care provider if:  You have redness or swelling around your incision.  You have fluid or blood coming from your incision.  Your incision feels warm to the touch.  You have pus or a bad smell coming from your incision.  You have a fever.  You have chills.  You have pain that does not get better with medicine.  Your carpal tunnel symptoms do not go away after 2 months.  Your carpal tunnel symptoms go away and then come back. Get help right away if:  You have pain or numbness that is getting worse.  Your fingers or fingertips become very pale or bluish in color.  You are not able to move your fingers. Summary  It  is common to have wrist stiffness and bruising after a carpal tunnel release.  Icing and raising (elevating) your wrist may help to lessen swelling and pain.  Call your health care provider if you have a fever or notice any signs of infection in your incision area. This information is not intended to replace advice given to you by your health care provider. Make sure you discuss any questions you have with your health care provider. Document Released: 04/14/2005 Document Revised: 09/07/2017 Document Reviewed: 06/04/2017 Elsevier Patient Education  2020 [employer] After These instructions provide you with information about caring for yourself after your procedure. Your health care provider may also give you more specific instructions. Your treatment  has been planned according to current medical practices, but problems sometimes occur. Call your health care provider if you have any problems or questions after your procedure. What can I expect after the procedure? After your procedure, you may:  Feel sleepy for several hours.  Feel clumsy and have poor balance for several hours.  Feel forgetful about what happened after the procedure.  Have poor judgment for several hours.  Feel nauseous or vomit.  Have a sore throat if you had a breathing tube during the procedure. Follow these instructions at home: For at least 24 hours after the procedure:      Have a responsible adult stay with you. It is important to have someone help care for you until you are awake and alert.  Rest as needed.  Do not: ? Participate in activities in which you could fall or become injured. ? Drive. ? Use heavy machinery. ? Drink alcohol. ? Take sleeping pills or medicines that cause drowsiness. ? Make important decisions or sign legal documents. ? Take care of children on your own. Eating and drinking  Follow the diet that is recommended by your health care provider.   If you vomit, drink water, juice, or soup when you can drink without vomiting.  Make sure you have little or no nausea before eating solid foods. General instructions  Take over-the-counter and prescription medicines only as told by your health care provider.  If you have sleep apnea, surgery and certain medicines can increase your risk for breathing problems. Follow instructions from your health care provider about wearing your sleep device: ? Anytime you are sleeping, including during daytime naps. ? While taking prescription pain medicines, sleeping medicines, or medicines that make you drowsy.  If you smoke, do not smoke without supervision.  Keep all follow-up visits as told by your health care provider. This is important. Contact a health care provider if:  You keep feeling nauseous or you keep vomiting.  You feel light-headed.  You develop a rash.  You have a fever. Get help right away if:  You have trouble breathing. Summary  For several hours after your procedure, you may feel sleepy and have poor judgment.  Have a responsible adult stay with you for at least 24 hours or until you are awake and alert. This information is not intended to replace advice given to you by your health care provider. Make sure you discuss any questions you have with your health care provider. Document Released: 01/16/2016 Document Revised: 12/24/2017 Document Reviewed: 01/16/2016 Elsevier Patient Education  2020 [employer]. How to Use Chlorhexidine for Bathing Chlorhexidine gluconate (CHG) is a germ-killing (antiseptic) solution that is used to clean the skin. It can get rid of the bacteria that normally live on the skin and can keep them away for about 24 hours. To clean your skin with CHG, you may be given:  A CHG solution to use in the shower or as part of a sponge bath.  A prepackaged cloth that contains CHG. Cleaning your skin with CHG may help lower the risk for infection:   While you are staying in the intensive care unit of the hospital.  If you have a vascular access, such as a central line, to provide short-term or long-term access to your veins.  If you have a catheter to drain urine from your bladder.  If you are on a ventilator. A ventilator is a machine that helps you breathe by moving air in and out of your lungs.  After surgery. What are the risks? Risks of using CHG include:  A skin reaction.  Hearing loss, if CHG gets in your ears.  Eye injury, if CHG gets in your eyes and is not rinsed out.  The CHG product catching fire. Make sure that you avoid smoking and flames after applying CHG to your skin. Do not use CHG:  If you have a chlorhexidine allergy or have previously reacted to chlorhexidine.  On babies younger than 40 months of age. How to use CHG solution  Use CHG only as told by your health care provider, and follow the instructions on the label.  Use the full amount of CHG as directed. Usually, this is one bottle. During a shower Follow these steps when using CHG solution during a shower (unless your health care provider gives you different instructions): 1. Start the shower. 2. Use your normal soap and shampoo to wash your face and hair. 3. Turn off the shower or move out of the shower stream. 4. Pour the CHG onto a clean washcloth. Do not use any type of brush or rough-edged sponge. 5. Starting at your neck, lather your body down to your toes. Make sure you follow these instructions: ? If you will be having surgery, pay special attention to the part of your body where you will be having surgery. Scrub this area for at least 1 minute. ? Do not use CHG on your head or face. If the solution gets into your ears or eyes, rinse them well with water. ? Avoid your genital area. ? Avoid any areas of skin that have broken skin, cuts, or scrapes. ? Scrub your back and under your arms. Make sure to wash skin folds. 6. Let the lather sit  on your skin for 1-2 minutes or as long as told by your health care provider. 7. Thoroughly rinse your entire body in the shower. Make sure that all body creases and crevices are rinsed well. 8. Dry off with a clean towel. Do not put any substances on your body afterward-such as powder, lotion, or perfume-unless you are told to do so by your health care provider. Only use lotions that are recommended by the manufacturer. 9. Put on clean clothes or pajamas. 10. If it is the night before your surgery, sleep in clean sheets.  During a sponge bath Follow these steps when using CHG solution during a sponge bath (unless your health care provider gives you different instructions): 1. Use your normal soap and shampoo to wash your face and hair. 2. Pour the CHG onto a clean washcloth. 3. Starting at your neck, lather your body down to your toes. Make sure you follow these instructions: ? If you will be having surgery, pay special attention to the part of your body where you will be having surgery. Scrub this area for at least 1 minute. ? Do not use CHG on your head or face. If the solution gets into your ears or eyes, rinse them well with water. ? Avoid your genital area. ? Avoid any areas of skin that have broken skin, cuts, or scrapes. ? Scrub your back and under your arms. Make sure to wash skin folds. 4. Let the lather sit on your skin for 1-2 minutes or as long as told by your health care provider. 5. Using a different clean, wet washcloth, thoroughly rinse your entire body. Make sure that all body creases and crevices are rinsed well. 6. Dry off with a clean towel. Do not  put any substances on your body afterward-such as powder, lotion, or perfume-unless you are told to do so by your health care provider. Only use lotions that are recommended by the manufacturer. 7. Put on clean clothes or pajamas. 8. If it is the night before your surgery, sleep in clean sheets. How to use CHG prepackaged cloths   Only use CHG cloths as told by your health care provider, and follow the instructions on the label.  Use the CHG cloth on clean, dry skin.  Do not use the CHG cloth on your head or face unless your health care provider tells you to.  When washing with the CHG cloth: ? Avoid your genital area. ? Avoid any areas of skin that have broken skin, cuts, or scrapes. Before surgery Follow these steps when using a CHG cloth to clean before surgery (unless your health care provider gives you different instructions): 1. Using the CHG cloth, vigorously scrub the part of your body where you will be having surgery. Scrub using a back-and-forth motion for 3 minutes. The area on your body should be completely wet with CHG when you are done scrubbing. 2. Do not rinse. Discard the cloth and let the area air-dry. Do not put any substances on the area afterward, such as powder, lotion, or perfume. 3. Put on clean clothes or pajamas. 4. If it is the night before your surgery, sleep in clean sheets.  For general bathing Follow these steps when using CHG cloths for general bathing (unless your health care provider gives you different instructions). 1. Use a separate CHG cloth for each area of your body. Make sure you wash between any folds of skin and between your fingers and toes. Wash your body in the following order, switching to a new cloth after each step: ? The front of your neck, shoulders, and chest. ? Both of your arms, under your arms, and your hands. ? Your stomach and groin area, avoiding the genitals. ? Your right leg and foot. ? Your left leg and foot. ? The back of your neck, your back, and your buttocks. 2. Do not rinse. Discard the cloth and let the area air-dry. Do not put any substances on your body afterward-such as powder, lotion, or perfume-unless you are told to do so by your health care provider. Only use lotions that are recommended by the manufacturer. 3. Put on clean clothes or  pajamas. Contact a health care provider if:  Your skin gets irritated after scrubbing.  You have questions about using your solution or cloth. Get help right away if:  Your eyes become very red or swollen.  Your eyes itch badly.  Your skin itches badly and is red or swollen.  Your hearing changes.  You have trouble seeing.  You have swelling or tingling in your mouth or throat.  You have trouble breathing.  You swallow any chlorhexidine. Summary  Chlorhexidine gluconate (CHG) is a germ-killing (antiseptic) solution that is used to clean the skin. Cleaning your skin with CHG may help to lower your risk for infection.  You may be given CHG to use for bathing. It may be in a bottle or in a prepackaged cloth to use on your skin. Carefully follow your health care provider's instructions and the instructions on the product label.  Do not use CHG if you have a chlorhexidine allergy.  Contact your health care provider if your skin gets irritated after scrubbing. This information is not intended to replace advice given to  you by your health care provider. Make sure you discuss any questions you have with your health care provider. Document Released: 06/19/2012 Document Revised: 12/12/2018 Document Reviewed: 08/23/2017 Elsevier Patient Education  2020 [employer].

## 2019-09-08 ENCOUNTER — Telehealth: Payer: Self-pay

## 2019-09-08 NOTE — Telephone Encounter (Signed)
I called her to advise, she should contact the facility that performed the IV or her primary care physician. I advised warm compresses may help.   To you FYI

## 2019-09-08 NOTE — Telephone Encounter (Signed)
Patient called stating she is having surgery on Thursday and pre-op tomorrow. States she had eye surgery on 11/20 and she is having some pain above that iv site. She is worried about blood clot and would like a return call.  Please call and advise

## 2019-09-09 ENCOUNTER — Other Ambulatory Visit: Payer: Self-pay | Admitting: Orthopedic Surgery

## 2019-09-09 ENCOUNTER — Encounter (HOSPITAL_COMMUNITY): Payer: Self-pay

## 2019-09-09 ENCOUNTER — Telehealth: Payer: Self-pay | Admitting: Orthopedic Surgery

## 2019-09-09 ENCOUNTER — Other Ambulatory Visit: Payer: Self-pay

## 2019-09-09 ENCOUNTER — Encounter (HOSPITAL_COMMUNITY)
Admission: RE | Admit: 2019-09-09 | Discharge: 2019-09-09 | Disposition: A | Discharge status: Home / Self Care | Payer: Medicare Other | Source: Ambulatory Visit | Attending: Orthopedic Surgery | Admitting: Orthopedic Surgery

## 2019-09-09 ENCOUNTER — Other Ambulatory Visit (HOSPITAL_COMMUNITY)
Admission: RE | Admit: 2019-09-09 | Discharge: 2019-09-09 | Disposition: A | Discharge status: Home / Self Care | Payer: Medicare Other | Source: Ambulatory Visit | Attending: Orthopedic Surgery | Admitting: Orthopedic Surgery

## 2019-09-09 DIAGNOSIS — Z01812 Encounter for preprocedural laboratory examination: Secondary | ICD-10-CM | POA: Diagnosis not present

## 2019-09-09 HISTORY — DX: Nausea with vomiting, unspecified: R11.2

## 2019-09-09 HISTORY — DX: Other complications of anesthesia, initial encounter: T88.59XA

## 2019-09-09 HISTORY — DX: Other specified postprocedural states: Z98.890

## 2019-09-09 LAB — BASIC METABOLIC PANEL
Anion gap: 13 (ref 5–15)
BUN: 38 mg/dL — ABNORMAL HIGH (ref 6–20)
CO2: 20 mmol/L — ABNORMAL LOW (ref 22–32)
Calcium: 9.1 mg/dL (ref 8.9–10.3)
Chloride: 104 mmol/L (ref 98–111)
Creatinine, Ser: 1.54 mg/dL — ABNORMAL HIGH (ref 0.44–1.00)
GFR calc Af Amer: 51 mL/min — ABNORMAL LOW (ref >60)
GFR calc non Af Amer: 44 mL/min — ABNORMAL LOW (ref >60)
Glucose, Bld: 227 mg/dL — ABNORMAL HIGH (ref 70–99)
Potassium: 4 mmol/L (ref 3.5–5.1)
Sodium: 137 mmol/L (ref 135–145)

## 2019-09-09 LAB — CBC WITH DIFFERENTIAL/PLATELET
Abs Immature Granulocytes: 0.02 10*3/uL (ref 0.00–0.07)
Basophils Absolute: 0.1 10*3/uL (ref 0.0–0.1)
Basophils Relative: 1 %
Eosinophils Absolute: 0.3 10*3/uL (ref 0.0–0.5)
Eosinophils Relative: 5 %
HCT: 40.8 % (ref 36.0–46.0)
Hemoglobin: 13.3 g/dL (ref 12.0–15.0)
Immature Granulocytes: 0 %
Lymphocytes Relative: 26 %
Lymphs Abs: 1.5 10*3/uL (ref 0.7–4.0)
MCH: 30.1 pg (ref 26.0–34.0)
MCHC: 32.6 g/dL (ref 30.0–36.0)
MCV: 92.3 fL (ref 80.0–100.0)
Monocytes Absolute: 0.3 10*3/uL (ref 0.1–1.0)
Monocytes Relative: 6 %
Neutro Abs: 3.5 10*3/uL (ref 1.7–7.7)
Neutrophils Relative %: 62 %
Platelets: 295 10*3/uL (ref 150–400)
RBC: 4.42 MIL/uL (ref 3.87–5.11)
RDW: 12.9 % (ref 11.5–15.5)
WBC: 5.7 10*3/uL (ref 4.0–10.5)
nRBC: 0 % (ref 0.0–0.2)

## 2019-09-09 LAB — GLUCOSE, CAPILLARY: Glucose-Capillary: 223 mg/dL — ABNORMAL HIGH (ref 70–99)

## 2019-09-09 LAB — HEMOGLOBIN A1C
Hgb A1c MFr Bld: 8 % — ABNORMAL HIGH (ref 4.8–5.6)
Mean Plasma Glucose: 182.9 mg/dL

## 2019-09-09 LAB — HCG, SERUM, QUALITATIVE: Preg, Serum: NEGATIVE

## 2019-09-09 MED ORDER — CEPHALEXIN 500 MG PO CAPS: 500.0000 mg | ORAL_CAPSULE | Freq: Two times a day (BID) | ORAL | 0 refills | Status: AC

## 2019-09-09 NOTE — Telephone Encounter (Signed)
Per voice message received from [place] at [facility] [phone]) patient's surgery for 09/11/19 has been cancelled, due to other medical issues related to other recent surgery performed at [facility] . States Dr Aline Brochure is aware.

## 2019-09-09 NOTE — Telephone Encounter (Signed)
Patient was contacted per Dr Ruthe Mannan note; aware of appointment on 09/17/19. Also aware to call if any concerns before then.

## 2019-09-09 NOTE — Progress Notes (Unsigned)
Says she can take other cephalosporins  Possible cellulitis from iv at [facility]. Couldn't get in touch with them  Came to preop instead   Looks red and swollen, tender to touch   Will check in a week to see if improving

## 2019-09-09 NOTE — Telephone Encounter (Signed)
Done

## 2019-09-09 NOTE — Telephone Encounter (Signed)
-----   Message from Carole Civil, MD sent at 09/09/2019 12:38 PM EST ----- Next wed need a check up on cellulitis  (surgery was prostponed )

## 2019-09-10 ENCOUNTER — Ambulatory Visit: Payer: Medicare Other | Attending: Nurse Practitioner | Admitting: Physical Therapy

## 2019-09-10 ENCOUNTER — Encounter: Payer: Self-pay | Admitting: Physical Therapy

## 2019-09-10 DIAGNOSIS — M25552 Pain in left hip: Secondary | ICD-10-CM | POA: Insufficient documentation

## 2019-09-10 DIAGNOSIS — R29898 Other symptoms and signs involving the musculoskeletal system: Secondary | ICD-10-CM | POA: Insufficient documentation

## 2019-09-10 DIAGNOSIS — R279 Unspecified lack of coordination: Secondary | ICD-10-CM | POA: Diagnosis not present

## 2019-09-10 DIAGNOSIS — M6281 Muscle weakness (generalized): Secondary | ICD-10-CM | POA: Insufficient documentation

## 2019-09-10 DIAGNOSIS — K5909 Other constipation: Secondary | ICD-10-CM | POA: Diagnosis not present

## 2019-09-10 NOTE — Therapy (Signed)
[facility] [address] [place], [place], [address] Phone: [phone]   Fax:  [phone]  Physical Therapy Treatment  Patient Details  Name: [name] MRN: [id number] Date of Birth: April 03, 1986 Referring Provider (PT): Dr. Mallie Snooks   Encounter Date: 09/10/2019  PT End of Session - 09/10/19 1250    Visit Number  3    Date for PT Re-Evaluation  10/23/19    Authorization Type  medicare/medicaid    PT Start Time  1245   came late   PT Stop Time  1315    PT Time Calculation (min)  30 min    Activity Tolerance  Patient tolerated treatment well    Behavior During Therapy  [facility] for tasks assessed/performed       Past Medical History:  Diagnosis Date  . Anxiety   . Asthma   . Asthma 11/10/2013  . Bipolar 1 disorder ([place])   . Complication of anesthesia   . Depression   . Diabetic gastroparesis ([place])   . Diabetic neuropathy, type I diabetes mellitus ([place])   . Gastroparesis   . Headache(784.0)   . Hypertension   . Kidney stones   . Polyneuropathy in diabetes(357.2)   . PONV (postoperative nausea and vomiting)   . 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[place])   . Tachycardia    baseline tachycardia   . 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 ANAL RECTAL MANOMETRY N/A 03/25/2018   Procedure: ANO RECTAL MANOMETRY;  Surgeon: Doran Stabler, MD;  Location: [facility];  Service: Gastroenterology;  Laterality: N/A;  . CESAREAN SECTION     x 2  . CHOLECYSTECTOMY    . EYE SURGERY    . REFRACTIVE SURGERY Bilateral     There were no vitals filed for this visit.  Subjective Assessment - 09/10/19 1247    Subjective  when I left the massage helped me to have regular bowel movements daily. I have not done the abdominal massage several days and had trouble with BM. I have an area on my left arm that is hurting  and possible blood clot. I am not having the carpal tunnel surgery due to the left arm.    Patient Stated Goals  improve toileting    Currently in Pain?  Yes    Pain Score  2     Pain Location  Abdomen    Pain Orientation  Lower    Pain Descriptors / Indicators  Cramping    Pain Type  Chronic pain    Pain Onset  More than a month ago    Pain Frequency  Intermittent    Aggravating Factors   when constipation    Pain Relieving Factors  heat    Multiple Pain Sites  No                       [facility] Adult PT Treatment/Exercise - 09/10/19 0001      Manual Therapy   Manual Therapy  Soft tissue mobilization;Myofascial release    Soft tissue mobilization  circular massage to the abdoment to promote peristalic motion of the intestines to improve bowel movements; STW to diaphgram to improve breathing for toileting    Myofascial Release  release of the mesenteric, iliacecal valve, along the connection of the large and small intestines, along the c-section scar, lifting the large  intestines off the bladder               PT Short Term Goals - 09/10/19 1328      PT SHORT TERM GOAL #1   Title  independent with initial HEP    Time  4    Period  Weeks    Status  On-going    Target Date  10/23/19      PT SHORT TERM GOAL #2   Title  understand how to perform abdominal massage to improve peristalic motion of the intestines    Time  4    Period  Weeks    Status  Achieved    Target Date  09/25/19      PT SHORT TERM GOAL #3   Title  understand correct toileting technique to push the stool out    Time  4    Period  Weeks    Status  On-going    Target Date  09/25/19      PT SHORT TERM GOAL #4   Title  able to demonstrate diaphragmatic breathing to relax the pelvic floor with toileting    Time  4    Period  Weeks    Status  On-going    Target Date  09/25/19        PT Long Term Goals - 08/28/19 1255      PT LONG TERM GOAL #1   Title  independent with advanced HEP     Time  8    Period  Weeks    Status  New    Target Date  10/23/19      PT LONG TERM GOAL #2   Title  able to have a complete bowel movement due to ability to relax the pelvic floor and improved peristalic motion of the intestines    Time  8    Period  Weeks    Status  New    Target Date  10/23/19      PT LONG TERM GOAL #3   Title  pelvic floor strength >/= 4/5 so she is able to push the stool out with greater ease    Time  8    Period  Weeks    Status  New    Target Date  10/23/19      PT LONG TERM GOAL #4   Title  straining to have a bowel movement improved >/= 50% due to improve pelvic floor muscle coordination    Time  8    Period  Weeks    Status  New    Target Date  10/23/19      PT LONG TERM GOAL #5   Title  abdominal pain improved >/= 80% due improved toileting    Time  8    Period  Weeks    Status  New    Target Date  10/23/19      PT LONG TERM GOAL #6   Title  --            Plan - 09/10/19 1250    Clinical Impression Statement  Patient was able to have several bowel movements for several days after last treatment. Patient was not able to do abdominal massage for several days and started to have difficulty with bowel movements. Patient has increased fascial restrictions of the lower abdomen that were released with manual work. Patient is not having adominal pain today. Patient will benefit from skilled therapy to improve pelvic floor coordination to  improve toileting.    Personal Factors and Comorbidities  Age;Fitness;Time since onset of injury/illness/exacerbation    Comorbidities  HTN, Type I DM, HTN, Asthma, Depression, Bipolar Disorder    Examination-Activity Limitations  Toileting;Continence    Stability/Clinical Decision Making  Evolving/Moderate complexity    Rehab Potential  Good    PT Frequency  1x / week    PT Duration  12 weeks    PT Treatment/Interventions  Biofeedback;Functional mobility training;Therapeutic activities;Therapeutic  exercise;Patient/family education;Neuromuscular re-education;Manual techniques;Dry needling    PT Next Visit Plan  abdominal massage, review toileting, hip stretches, massage the left pelvic floor, work on anterior movement of the puborectalis    Consulted and Agree with Plan of Care  Patient       Patient will benefit from skilled therapeutic intervention in order to improve the following deficits and impairments:  Decreased coordination, Increased fascial restricitons, Decreased strength  Visit Diagnosis: Muscle weakness (generalized)  Unspecified lack of coordination  Chronic constipation     Problem List Patient Active Problem List   Diagnosis Date Noted  . Pseudophakia of left eye 11/04/2018  . Constipation   . CKD (chronic kidney disease), stage III 01/01/2017  . Diabetic nephropathy associated with type 1 diabetes mellitus ([place]) 03/01/2016  . Bipolar disorder in partial remission ([place]) 02/01/2016  . Chronic hypertension 02/01/2016  . DM retinopathy ([place])   . Encounter for preconception consultation   . After-cataract obscuring vision, left 09/22/2014  . MDD (major depressive disorder), severe ([place]) 05/31/2014  . Drug overdose, intentional ([place]) 03/27/2014  . Poisoning by drug or medicinal substance 03/27/2014  . Asthma 11/10/2013  . Colles' fracture of left radius 10/11/2013  . Type I diabetes mellitus with complication, uncontrolled ([place]) 10/11/2013  . MDD (major depressive disorder), recurrent episode, severe ([place]) 09/26/2013  . Generalized anxiety disorder 09/26/2013  . Aphakia, right eye 09/14/2012  . Retinal detachment, tractional, left eye 06/11/2012  . Anemia 06/07/2012  . Eczema 06/07/2012  . Renal calculi 06/07/2012  . Hyperglycemia 04/30/2012  . Diabetic retinopathy associated with type 1 diabetes mellitus ([place]) 04/30/2012  . Polyneuropathy in diabetes(357.2) 04/30/2012  . H/O insertion of insulin pump 04/30/2012  . Polyneuropathy in diabetes ([place])  04/30/2012  . CHOLELITHIASIS 08/10/2010  . ATRIAL TACHYCARDIA 08/08/2010  . Gastroparesis 08/08/2010  . LIVER FUNCTION TESTS, ABNORMAL, HX OF 08/08/2010  . Type 1 diabetes mellitus with ketoacidosis, uncontrolled ([place]) 08/02/2010  . BOWEL INTUSSUSCEPTION 08/02/2010  . PANCREATITIS, ACUTE, HX OF 08/02/2010  . ABDOMINAL PAIN, LEFT UPPER QUADRANT 08/01/2010    Earlie Counts, PT 09/10/19 1:30 PM   [facility] [address], [place], [place], [address] Phone: [phone]   Fax:  [phone]  Name: [name] MRN: [id number] Date of Birth: Sep 09, 1986

## 2019-09-11 ENCOUNTER — Encounter (HOSPITAL_COMMUNITY): Admission: RE | Payer: Self-pay | Source: Home / Self Care

## 2019-09-11 ENCOUNTER — Ambulatory Visit (HOSPITAL_COMMUNITY): Admission: RE | Admit: 2019-09-11 | Payer: Medicare Other | Source: Home / Self Care | Admitting: Orthopedic Surgery

## 2019-09-11 ENCOUNTER — Encounter: Payer: Medicaid Other | Admitting: Physical Therapy

## 2019-09-11 SURGERY — CARPAL TUNNEL RELEASE
Anesthesia: Choice | Laterality: Left

## 2019-09-12 ENCOUNTER — Other Ambulatory Visit: Payer: Self-pay | Admitting: Family Medicine

## 2019-09-12 DIAGNOSIS — J45901 Unspecified asthma with (acute) exacerbation: Secondary | ICD-10-CM

## 2019-09-16 ENCOUNTER — Ambulatory Visit (INDEPENDENT_AMBULATORY_CARE_PROVIDER_SITE_OTHER): Payer: Medicare Other | Admitting: Gastroenterology

## 2019-09-16 ENCOUNTER — Encounter: Payer: Self-pay | Admitting: Gastroenterology

## 2019-09-16 VITALS — BP 106/78 | HR 80 | Temp 98.1°F | Ht 66.0 in | Wt 190.4 lb

## 2019-09-16 DIAGNOSIS — R140 Abdominal distension (gaseous): Secondary | ICD-10-CM | POA: Diagnosis not present

## 2019-09-16 DIAGNOSIS — K3184 Gastroparesis: Secondary | ICD-10-CM

## 2019-09-16 DIAGNOSIS — E1143 Type 2 diabetes mellitus with diabetic autonomic (poly)neuropathy: Secondary | ICD-10-CM

## 2019-09-16 DIAGNOSIS — K219 Gastro-esophageal reflux disease without esophagitis: Secondary | ICD-10-CM

## 2019-09-16 DIAGNOSIS — K5901 Slow transit constipation: Secondary | ICD-10-CM

## 2019-09-16 DIAGNOSIS — K5902 Outlet dysfunction constipation: Secondary | ICD-10-CM

## 2019-09-16 NOTE — Progress Notes (Signed)
[facility] Progress Note  Chief Complaint: Diabetic gastroparesis  Subjective  History:  [name] was seen in follow-up for her diabetic gastroparesis and constipation.  She was on Reglan in the past, but I cannot continue it because of a potential tardive dyskinesia interaction with her Latuda.  She is still trying to make arrangements to get domperidone from [facility], and says she will be able to do so soon since she got disability and has the funds for that medicine. She was seen by our nurse practitioner several months ago for worsening constipation, is noted to have pelvic floor dysfunction and recently started biofeedback therapy for that.  At that time, her gastroparesis was also worsening reflux symptoms with heartburn and regurgitation.  She took a bottle of magnesium citrate for relief of constipation, the biofeedback therapy seem to help the constipation initially but not so much now.  We put her on a trial of Motegrity 2 mg once daily, which she started 7 to 10 days ago.  It does not seem to have helped very much.   ROS: Cardiovascular:  no chest pain Respiratory: no dyspnea Mood stable Remainder of systems negative except as above The patient's Past Medical, Family and Social History were reviewed and are on file in the EMR.  Objective:  Med list reviewed  Current Outpatient Medications:    acetaminophen (TYLENOL) 500 MG tablet, Take 500 mg by mouth every 6 (six) hours as needed for mild pain. , Disp: , Rfl:    albuterol (PROVENTIL) (2.5 MG/3ML) 0.083% nebulizer solution, Take 3 mLs (2.5 mg total) by nebulization every 6 (six) hours as needed for wheezing or shortness of breath., Disp: 150 mL, Rfl: 1   albuterol (VENTOLIN HFA) 108 (90 Base) MCG/ACT inhaler, INHALE 2 PUFFS BY MOUTH EVERY 6 HOURS AS NEEDED FOR SHORTNESS OF BREATH, Disp: 8.5 g, Rfl: 3   AMBULATORY NON FORMULARY MEDICATION, DOMPERIDONE 10 mg, twice a day. One with breakfast One with  supper Dispense 60 tablets  2 refills, Disp: 60 tablet, Rfl: 2   amLODipine (NORVASC) 2.5 MG tablet, Take 2.5 mg by mouth daily at 2 PM. , Disp: , Rfl:    busPIRone (BUSPAR) 15 MG tablet, TAKE 2 TABLETS (30 MG TOTAL) BY MOUTH 2 (TWO) TIMES DAILY., Disp: 120 tablet, Rfl: 1   carbamazepine (EQUETRO) 200 MG CP12 12 hr capsule, Take 2 capsules in the morning and 3 at bedtime (Patient taking differently: Take 400-600 mg by mouth See admin instructions. Take 2 capsules (400 mg) by mouth in the morning & take 3 capsules (600 mg) by mouth at bedtime.), Disp: 150 capsule, Rfl: 2   cephALEXin (KEFLEX) 500 MG capsule, Take 1 capsule (500 mg total) by mouth 2 (two) times daily for 14 days., Disp: 28 capsule, Rfl: 0   ciclopirox (PENLAC) 8 % solution, Apply one coat to each toenail daily. Remove weekly with polish remover., Disp: 6.6 mL, Rfl: 11   EMGALITY 120 MG/ML SOSY, Inject 120 mg into the skin every 30 (thirty) days., Disp: , Rfl:    enalapril (VASOTEC) 10 MG tablet, Take 10 mg by mouth daily., Disp: , Rfl:    etonogestrel (NEXPLANON) 68 MG IMPL implant, 1 each by Subdermal route once., Disp: , Rfl:    fluticasone (FLONASE) 50 MCG/ACT nasal spray, Place 2 sprays into both nostrils daily. (Patient taking differently: Place 2 sprays into both nostrils daily as needed (seasonal allergies.). ), Disp: 16 g, Rfl: 2   furosemide (LASIX) 40 MG  tablet, Take 40 mg by mouth every other day. In the morning., Disp: , Rfl:    gabapentin (NEURONTIN) 300 MG capsule, Take 1 capsule (300 mg total) by mouth 3 (three) times daily. (Patient taking differently: Take 300 mg by mouth 3 (three) times daily as needed. ), Disp: 90 capsule, Rfl: 3   gabapentin (NEURONTIN) 400 MG capsule, Take 1 capsule (400 mg total) by mouth 3 (three) times daily., Disp: 42 capsule, Rfl: 1   GLUCAGON EMERGENCY 1 MG injection, Inject 1 mg into the skin as needed (hypoglycemia.). , Disp: , Rfl:    Insulin Human (INSULIN PUMP) SOLN, Inject 1  each into the skin 3 times daily with meals, bedtime and 2 AM. Novolog/36-38 units/day basal; bolus sliding scale/adjusted per sliding scale by patient, Disp: , Rfl:    lamoTRIgine (LAMICTAL) 150 MG tablet, TAKE 2 TABLETS (300 MG TOTAL) BY MOUTH 2 (TWO) TIMES DAILY., Disp: 120 tablet, Rfl: 2   LATUDA 120 MG TABS, TAKE 1 TABLET BY MOUTH EVERY DAY (Patient taking differently: Take 120 mg by mouth at bedtime. ), Disp: 30 tablet, Rfl: 2   mometasone-formoterol (DULERA) 100-5 MCG/ACT AERO, Inhale 2 puffs into the lungs 2 (two) times daily., Disp: 1 Inhaler, Rfl: 5   montelukast (SINGULAIR) 10 MG tablet, Take 1 tablet (10 mg total) by mouth at bedtime., Disp: 90 tablet, Rfl: 3   NOVOLOG 100 UNIT/ML injection, Inject 36-150 Units into the skin. Via insulin pump per sliding scale, Disp: , Rfl:    pantoprazole (PROTONIX) 20 MG tablet, Take 1 tablet (20 mg total) by mouth daily before breakfast. Take at least 30 minutes before breakfast, Disp: 30 tablet, Rfl: 3   Pramipexole Dihydrochloride 0.75 MG TB24, Take 1 tablet (0.75 mg total) by mouth every evening., Disp: 30 tablet, Rfl: 1   promethazine (PHENERGAN) 25 MG tablet, Take 0.5 tablets (12.5 mg total) by mouth every 8 (eight) hours as needed for nausea or vomiting. (Patient taking differently: Take 12.5-25 mg by mouth every 8 (eight) hours as needed for nausea or vomiting. ), Disp: 30 tablet, Rfl: 1   Prucalopride Succinate (MOTEGRITY) 2 MG TABS, Take 1 tablet (2 mg total) by mouth every morning., Disp: 30 tablet, Rfl: 1   Topiramate ER (TROKENDI XR) 100 MG CP24, Take 100 mg by mouth 2 (two) times daily., Disp: , Rfl:    traZODone (DESYREL) 50 MG tablet, Take 1 tablet (50 mg total) by mouth at bedtime. (Patient taking differently: Take 50 mg by mouth at bedtime as needed for sleep. ), Disp: 30 tablet, Rfl: 2   Ubrogepant (UBRELVY) 100 MG TABS, Take 100 mg by mouth daily as needed (migraine headaches.). , Disp: , Rfl:    Urea 40 % LOTN, Apply 1  application topically daily. Apply only to calluses, Disp: 325 mL, Rfl: 0   Vital signs in last 24 hrs: Vitals:   09/16/19 1338  BP: 106/78  Pulse: 80  Temp: 98.1 F (36.7 C)    Physical Exam   HEENT: sclera anicteric, oral mucosa moist without lesions  Neck: supple, no thyromegaly, JVD or lymphadenopathy  Cardiac: RRR without murmurs, S1S2 heard, no peripheral edema  Pulm: clear to auscultation bilaterally, normal RR and effort noted  Abdomen: soft, no tenderness, with active bowel sounds. No guarding or palpable hepatosplenomegaly.  Skin; warm and dry, no jaundice or rash  @ASSESSMENTPLANBEGIN @ Assessment: Encounter Diagnoses  Name Primary?   Dyssynergic constipation Yes   Gastroparesis due to DM (HCC)    Abdominal bloating  Gastroesophageal reflux disease, unspecified whether esophagitis present    Diabetic gastroparesis, lately improved with diet changes, which is also helped her reflux symptoms as well.  The Motegrity does not seem to be helping constipation much, but wonder if it may still be helping to improve gastric emptying.   Plan: Continue pelvic floor PT Take a bottle of magnesium citrate for initial relief of constipation, then continue Motegrity 2 mg once daily.  If it still does not seem to be helping to relieve constipation in another 2 weeks, stop it.  Begin the domperidone per previous instructions when she is able to obtain it.  If she is still taking Motegrity at that time, she must stop it when the domperidone begins.  Follow-up in 3 months or sooner as needed  Total time 25 minutes, over half spent face-to-face with patient in counseling and coordination of care.   [name]

## 2019-09-16 NOTE — Patient Instructions (Addendum)
If you are age 34 or older, your body mass index should be between 23-30. Your Body mass index is 30.73 kg/m. If this is out of the aforementioned range listed, please consider follow up with your Primary Care Provider.  If you are age 6 or younger, your body mass index should be between 19-25. Your Body mass index is 30.73 kg/m. If this is out of the aformentioned range listed, please consider follow up with your Primary Care Provider.   Please purchase a bottle of magnesium citrate and drink to get your bowels moving.  Continue Reglan. If no improvements in 2 weeks STOP!!!! When you obtain the medication domperidone let us know.  It was a pleasure to see you today!  Dr. Loletha Carrow

## 2019-09-17 ENCOUNTER — Ambulatory Visit (INDEPENDENT_AMBULATORY_CARE_PROVIDER_SITE_OTHER): Payer: Medicare Other | Admitting: Orthopedic Surgery

## 2019-09-17 ENCOUNTER — Other Ambulatory Visit: Payer: Self-pay

## 2019-09-17 ENCOUNTER — Other Ambulatory Visit: Payer: Self-pay | Admitting: Orthopedic Surgery

## 2019-09-17 ENCOUNTER — Telehealth: Payer: Self-pay | Admitting: Orthopedic Surgery

## 2019-09-17 ENCOUNTER — Telehealth: Payer: Self-pay | Admitting: Radiology

## 2019-09-17 VITALS — BP 126/74 | HR 70 | Temp 97.1°F | Ht 66.0 in | Wt 199.0 lb

## 2019-09-17 DIAGNOSIS — G5602 Carpal tunnel syndrome, left upper limb: Secondary | ICD-10-CM | POA: Diagnosis not present

## 2019-09-17 DIAGNOSIS — L03114 Cellulitis of left upper limb: Secondary | ICD-10-CM | POA: Diagnosis not present

## 2019-09-17 NOTE — Telephone Encounter (Signed)
Put in new orders so they would drop in depot

## 2019-09-17 NOTE — Telephone Encounter (Signed)
Patient called to relay that she has given more thought to the surgery date scheduled for 09/30/19; states due to family commitments, would like to re-schedule to a date after 10/05/19.

## 2019-09-17 NOTE — Telephone Encounter (Signed)
-----   Message from Josue Hector sent at 09/17/2019  2:14 PM EST ----- Hey,  She is not in my depot.  The orders are probably still there but the booking isn't.  We can do it on 12/22, just drop it in the depot for me.  Thanks,  Hoyle Sauer ----- Message ----- From: Candice Camp, RT Sent: 09/17/2019  11:39 AM EST To: Josue Hector  Hi, patients surgery was cancelled (left CTR) I think orders are still in there, can we RS her to 09/30/19?  If you need me to do orders again, let me know, but I can still see them open, so I think they are still there.

## 2019-09-17 NOTE — Progress Notes (Signed)
Encounter Diagnosis  Name Primary?  . Cellulitis of left arm Yes    Chief Complaint  Patient presents with  . Follow-up    Recheck on left wrist, CTS.    History 33 year old female was getting preop for a left carpal tunnel release.  At the preop visit she was found to have a cellulitis of the left upper arm from an IV placed at [facility] for another surgery that she had.  She was put on Keflex and she is here for 1 week follow-up  She says the arm feels much better  Physical Exam Constitutional:      Appearance: Normal appearance.  Musculoskeletal:     Comments: Left upper arm no tenderness erythema or swelling and she has regained full range of motion  Skin:    Capillary Refill: Capillary refill takes less than 2 seconds.  Neurological:     Mental Status: She is alert.  Psychiatric:        Mood and Affect: Mood normal.        Behavior: Behavior normal.    Cellulitis resolved  Left carpal tunnel syndrome Reschedule left carpal tunnel release

## 2019-09-17 NOTE — Telephone Encounter (Signed)
Called her, we can do January 7th left message for her to call me back and let me know

## 2019-09-18 ENCOUNTER — Encounter: Payer: Medicaid Other | Admitting: Physical Therapy

## 2019-09-19 NOTE — Telephone Encounter (Signed)
We spoke and it was RS

## 2019-09-20 ENCOUNTER — Other Ambulatory Visit: Payer: Self-pay | Admitting: Psychiatry

## 2019-09-22 ENCOUNTER — Other Ambulatory Visit: Payer: Self-pay | Admitting: Psychiatry

## 2019-09-22 DIAGNOSIS — F314 Bipolar disorder, current episode depressed, severe, without psychotic features: Secondary | ICD-10-CM

## 2019-09-24 ENCOUNTER — Ambulatory Visit: Payer: Medicare Other | Admitting: Physical Therapy

## 2019-09-25 ENCOUNTER — Other Ambulatory Visit: Payer: Self-pay

## 2019-09-25 ENCOUNTER — Ambulatory Visit (INDEPENDENT_AMBULATORY_CARE_PROVIDER_SITE_OTHER): Payer: Medicare Other | Admitting: Podiatry

## 2019-09-25 DIAGNOSIS — E1149 Type 2 diabetes mellitus with other diabetic neurological complication: Secondary | ICD-10-CM | POA: Diagnosis not present

## 2019-09-25 DIAGNOSIS — L84 Corns and callosities: Secondary | ICD-10-CM | POA: Diagnosis not present

## 2019-09-26 ENCOUNTER — Ambulatory Visit: Payer: Medicaid Other | Admitting: Gastroenterology

## 2019-09-28 NOTE — Progress Notes (Signed)
Subjective: 33 year old female presents the office today for pre-ulcerative calluses to both of his feet.  She states the calluses become very thick again causing irritation but she has trimmed on them herself. She does feel that the inserts and shoes have been helpful.  She denies any skin breakdown or drainage or pus. No swelling or redness.  She has no other concerns.  No open sores.  She did not get the urea cream.No acute changes since last appointment, and no other complaints at this time.   Objective: AAO x3, NAD DP/PT pulses palpable bilaterally, CRT less than 3 seconds Hyperkeratotic lesions present bilateral medial hallux as well as submetatarsal 1 bilaterally.  There is subjective tenderness of the submetatarsal 1 lesions.  Upon debridement there pre-ulcerative but there is no ongoing ulceration drainage or signs of infection today. No open lesions or pre-ulcerative lesions.  No pain with calf compression, swelling, warmth, erythema  Assessment: Pre-ulcerative calluses/dry skin  Plan: -All treatment options discussed with the patient including all alternatives, risks, complications.  -Hyperkeratotic lesions were sharply debrided x4 without any complications or bleeding today.  Prescribed urea cream to apply topically to the calluses. Recommended not to go barefoot even at home. Discussed shoes that she can wear at home to help prevent increased pressure.  -Discussed importance of daily foot inspection. -Discussed the importance of glucose control.  She states that her A1c has been high. -Patient encouraged to call the office with any questions, concerns, change in symptoms.   Trula Slade DPM

## 2019-09-29 ENCOUNTER — Other Ambulatory Visit: Payer: Self-pay | Admitting: Psychiatry

## 2019-10-01 ENCOUNTER — Other Ambulatory Visit: Payer: Self-pay

## 2019-10-01 ENCOUNTER — Ambulatory Visit: Payer: Medicare Other | Admitting: Physical Therapy

## 2019-10-01 ENCOUNTER — Encounter: Payer: Self-pay | Admitting: Physical Therapy

## 2019-10-01 DIAGNOSIS — R279 Unspecified lack of coordination: Secondary | ICD-10-CM | POA: Diagnosis not present

## 2019-10-01 DIAGNOSIS — K5909 Other constipation: Secondary | ICD-10-CM

## 2019-10-01 DIAGNOSIS — R29898 Other symptoms and signs involving the musculoskeletal system: Secondary | ICD-10-CM

## 2019-10-01 DIAGNOSIS — M25552 Pain in left hip: Secondary | ICD-10-CM

## 2019-10-01 DIAGNOSIS — M6281 Muscle weakness (generalized): Secondary | ICD-10-CM | POA: Diagnosis not present

## 2019-10-01 NOTE — Therapy (Signed)
[facility] [address], [place], [place], [address] Phone: [phone]   Fax:  [phone]  Physical Therapy Treatment  Patient Details  Name: [name] MRN: [id number] Date of Birth: 30-Jun-1986 Referring Provider (PT): Dr. Mallie Snooks   Encounter Date: 10/01/2019  PT End of Session - 10/01/19 1314    Visit Number  4    Date for PT Re-Evaluation  10/23/19    Authorization Type  medicare/medicaid    PT Start Time  1230    PT Stop Time  1310    PT Time Calculation (min)  40 min    Activity Tolerance  Patient tolerated treatment well;No increased pain    Behavior During Therapy  WFL for tasks assessed/performed       Past Medical History:  Diagnosis Date  . Anxiety   . Asthma   . Asthma 11/10/2013  . Bipolar 1 disorder ([place])   . Complication of anesthesia   . Depression   . Diabetic gastroparesis ([place])   . Diabetic neuropathy, type I diabetes mellitus ([place])   . Gastroparesis   . Headache(784.0)   . Hypertension   . Kidney stones   . Polyneuropathy in diabetes(357.2)   . PONV (postoperative nausea and vomiting)   . 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[place])   . Tachycardia    baseline tachycardia   . 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 ANAL RECTAL MANOMETRY N/A 03/25/2018   Procedure: ANO RECTAL MANOMETRY;  Surgeon: Doran Stabler, MD;  Location: [facility];  Service: Gastroenterology;  Laterality: N/A;  . CESAREAN SECTION     x 2  . CHOLECYSTECTOMY    . EYE SURGERY    . REFRACTIVE SURGERY Bilateral     There were no vitals filed for this visit.  Subjective Assessment - 10/01/19 1232    Subjective  My arm is all cleared up. The bowel movements are doing alright. I saw my GI doctor and wants me to stay on the [place]. I had an accident in the bed when the medicine kicked in. the  medicine acts quicker. I take it once a day. I feel gassy.    Patient Stated Goals  improve toileting    Currently in Pain?  No/denies                       [facility] Adult PT Treatment/Exercise - 10/01/19 0001      Self-Care   Self-Care  Other Self-Care Comments    Other Self-Care Comments   education on chewing food throughly, sitting upright, how the chewing to produce more saliva      Lumbar Exercises: Stretches   Active Hamstring Stretch  Right;Left;1 rep;30 seconds    Active Hamstring Stretch Limitations  sitting    Quadruped Mid Back Stretch  1 rep;30 seconds    Piriformis Stretch  Right;Left;1 rep;30 seconds    Piriformis Stretch Limitations  sitting    Other Lumbar Stretch Exercise  butterfly stretch with diaphragmatic breathing      Manual Therapy   Manual Therapy  Soft tissue mobilization;Myofascial release    Soft tissue mobilization  circular massage to the abdoment to promote peristalic motion of the intestines to improve bowel movements; STW to diaphgram to improve breathing for toileting    Myofascial Release  Quadruped pulling the soft tissue from the back  to the abdomen to release the tissue and pull the intestinces off the back wall; release of the mesenteric, iliacecal valve, along the connection of the large and small intestines, lifting the large intestines off the bladder; release around the right and left upper quadrant             PT Education - 10/01/19 1311    Education Details  Access Code: [email or web address]    Person(s) Educated  Patient    Methods  Explanation;Demonstration;Verbal cues;Handout    Comprehension  Returned demonstration;Verbalized understanding       PT Short Term Goals - 10/01/19 1235      PT SHORT TERM GOAL #1   Title  independent with initial HEP    Time  4    Period  Weeks    Status  Achieved      PT SHORT TERM GOAL #2   Title  understand how to perform abdominal massage to improve peristalic motion of the intestines     Time  4    Period  Weeks    Status  Achieved      PT SHORT TERM GOAL #3   Title  understand correct toileting technique to push the stool out    Time  4    Period  Weeks    Status  Achieved    Target Date  09/25/19        PT Long Term Goals - 08/28/19 1255      PT LONG TERM GOAL #1   Title  independent with advanced HEP    Time  8    Period  Weeks    Status  New    Target Date  10/23/19      PT LONG TERM GOAL #2   Title  able to have a complete bowel movement due to ability to relax the pelvic floor and improved peristalic motion of the intestines    Time  8    Period  Weeks    Status  New    Target Date  10/23/19      PT LONG TERM GOAL #3   Title  pelvic floor strength >/= 4/5 so she is able to push the stool out with greater ease    Time  8    Period  Weeks    Status  New    Target Date  10/23/19      PT LONG TERM GOAL #4   Title  straining to have a bowel movement improved >/= 50% due to improve pelvic floor muscle coordination    Time  8    Period  Weeks    Status  New    Target Date  10/23/19      PT LONG TERM GOAL #5   Title  abdominal pain improved >/= 80% due improved toileting    Time  8    Period  Weeks    Status  New    Target Date  10/23/19      PT LONG TERM GOAL #6   Title  --            Plan - 10/01/19 1318    Clinical Impression Statement  Patient was not wanting to perform internal work today so we stayed with external work. Patient is having a bowel movement when she takes her medicine. Patient was instructed on ways to fully chew her food to promote more saliva for digestion and to full digest the food. Patient was instructed  on the correct posture to pormote more digestion. Patient understands correct toileting technique. Patient will benefit from skilled therapy to improve pelvic floor coordination and to improve toileting.    Personal Factors and Comorbidities  Age;Fitness;Time since onset of injury/illness/exacerbation     Comorbidities  HTN, Type I DM, HTN, Asthma, Depression, Bipolar Disorder    Examination-Activity Limitations  Toileting;Continence    Stability/Clinical Decision Making  Evolving/Moderate complexity    Rehab Potential  Good    PT Frequency  1x / week    PT Duration  12 weeks    PT Treatment/Interventions  Biofeedback;Functional mobility training;Therapeutic activities;Therapeutic exercise;Patient/family education;Neuromuscular re-education;Manual techniques;Dry needling    PT Next Visit Plan  work on pelvic floor externally, work on pelvic floor contraction and elongation, go over diaphragmatic breathing; ask about abdominal pain    Consulted and Agree with Plan of Care  Patient       Patient will benefit from skilled therapeutic intervention in order to improve the following deficits and impairments:  Decreased coordination, Increased fascial restricitons, Decreased strength  Visit Diagnosis: Muscle weakness (generalized)  Unspecified lack of coordination  Chronic constipation  Other symptoms and signs involving the musculoskeletal system  Pain in left hip     Problem List Patient Active Problem List   Diagnosis Date Noted  . Pseudophakia of left eye 11/04/2018  . Constipation   . CKD (chronic kidney disease), stage III 01/01/2017  . Diabetic nephropathy associated with type 1 diabetes mellitus ([place]) 03/01/2016  . Bipolar disorder in partial remission ([place]) 02/01/2016  . Chronic hypertension 02/01/2016  . DM retinopathy ([place])   . Encounter for preconception consultation   . After-cataract obscuring vision, left 09/22/2014  . MDD (major depressive disorder), severe ([place]) 05/31/2014  . Drug overdose, intentional ([place]) 03/27/2014  . Poisoning by drug or medicinal substance 03/27/2014  . Asthma 11/10/2013  . Colles' fracture of left radius 10/11/2013  . Type I diabetes mellitus with complication, uncontrolled ([place]) 10/11/2013  . MDD (major depressive disorder), recurrent  episode, severe ([place]) 09/26/2013  . Generalized anxiety disorder 09/26/2013  . Aphakia, right eye 09/14/2012  . Retinal detachment, tractional, left eye 06/11/2012  . Anemia 06/07/2012  . Eczema 06/07/2012  . Renal calculi 06/07/2012  . Hyperglycemia 04/30/2012  . Diabetic retinopathy associated with type 1 diabetes mellitus ([place]) 04/30/2012  . Polyneuropathy in diabetes(357.2) 04/30/2012  . H/O insertion of insulin pump 04/30/2012  . Polyneuropathy in diabetes ([place]) 04/30/2012  . CHOLELITHIASIS 08/10/2010  . ATRIAL TACHYCARDIA 08/08/2010  . Gastroparesis 08/08/2010  . LIVER FUNCTION TESTS, ABNORMAL, HX OF 08/08/2010  . Type 1 diabetes mellitus with ketoacidosis, uncontrolled ([place]) 08/02/2010  . BOWEL INTUSSUSCEPTION 08/02/2010  . PANCREATITIS, ACUTE, HX OF 08/02/2010  . ABDOMINAL PAIN, LEFT UPPER QUADRANT 08/01/2010    Earlie Counts, PT 10/01/19 1:25 PM   [facility] [address], [place], [place], [address] Phone: [phone]   Fax:  [phone]  Name: Naviya Hetz MRN: [id number] Date of Birth: 1985/12/15

## 2019-10-01 NOTE — Patient Instructions (Signed)
Access Code: [email or web address]  URL: [email or web address]  Date: 10/01/2019  Prepared by: Earlie Counts   Exercises Seated Piriformis Stretch with Trunk Bend - 2 reps - 1 sets - 30 sec hold - 1x daily - 7x weekly Seated Hamstring Stretch - 2 reps - 1 sets - 30 sec hold - 1x daily - 7x weekly Supine Butterfly Groin Stretch - 1 reps - 1 sets - 1 min hold - 1x daily - 7x weekly Child's Pose Stretch - 1 reps - 1 sets - 30 sec hold - 1x daily - 7x weekly [facility] [address], [place], [place] [address] Phone # [phone] Fax [phone]

## 2019-10-06 ENCOUNTER — Ambulatory Visit: Payer: Medicare Other | Admitting: Orthopedic Surgery

## 2019-10-07 ENCOUNTER — Other Ambulatory Visit: Payer: Self-pay

## 2019-10-07 ENCOUNTER — Telehealth: Payer: Self-pay | Admitting: Orthopedic Surgery

## 2019-10-07 ENCOUNTER — Encounter: Payer: Self-pay | Admitting: Physical Therapy

## 2019-10-07 ENCOUNTER — Ambulatory Visit: Payer: Medicare Other | Admitting: Physical Therapy

## 2019-10-07 DIAGNOSIS — R29898 Other symptoms and signs involving the musculoskeletal system: Secondary | ICD-10-CM | POA: Diagnosis not present

## 2019-10-07 DIAGNOSIS — K5909 Other constipation: Secondary | ICD-10-CM | POA: Diagnosis not present

## 2019-10-07 DIAGNOSIS — M25552 Pain in left hip: Secondary | ICD-10-CM

## 2019-10-07 DIAGNOSIS — R279 Unspecified lack of coordination: Secondary | ICD-10-CM | POA: Diagnosis not present

## 2019-10-07 DIAGNOSIS — M6281 Muscle weakness (generalized): Secondary | ICD-10-CM | POA: Diagnosis not present

## 2019-10-07 NOTE — Patient Instructions (Signed)
Bear Down    Exhaling, bear down as if to have a bowel movement. Make belly big and bulge down for 3 seconds Repeat _5__ times. Do _3__ times a day.  Copyright  VHI. All rights reserved.   [place] [address], [place], [place] [address] Phone # [phone] Fax [phone]

## 2019-10-07 NOTE — Telephone Encounter (Signed)
Called left message for her to call me back.

## 2019-10-07 NOTE — Telephone Encounter (Signed)
Wants to talk to you regarding surgery on 10/16/19.  She has 2 different problems and wants to get advice as to what to do.

## 2019-10-07 NOTE — Therapy (Addendum)
[facility] [address], [address] [place], [place], [address] Phone: [phone]   Fax:  [phone]  Physical Therapy Treatment  Patient Details  Name: [name] MRN: [id number] Date of Birth: 07/23/1986 Referring Provider (PT): Dr. Parul Guenther   Encounter Date: 10/07/2019  PT End of Session - 10/07/19 1352    Visit Number  5    Date for PT Re-Evaluation  10/23/19    Authorization Type  medicare/medicaid    PT Start Time  1245   came late   PT Stop Time  1315    PT Time Calculation (min)  30 min    Activity Tolerance  Patient tolerated treatment well;No increased pain    Behavior During Therapy  WFL for tasks assessed/performed       Past Medical History:  Diagnosis Date  . Anxiety   . Asthma   . Asthma 11/10/2013  . Bipolar 1 disorder ([facility])   . Complication of anesthesia   . Depression   . Diabetic gastroparesis ([facility])   . Diabetic neuropathy, type I diabetes mellitus ([facility])   . Gastroparesis   . Headache(784.0)   . Hypertension   . Kidney stones   . Polyneuropathy in diabetes(357.2)   . PONV (postoperative nausea and vomiting)   . 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)   . Tachycardia    baseline tachycardia   . 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 ANAL RECTAL MANOMETRY N/A 03/25/2018   Procedure: ANO RECTAL MANOMETRY;  Surgeon: Danis, Henry L III, MD;  [facility];  Service: Gastroenterology;  Laterality: N/A;  . CESAREAN SECTION     x 2  . CHOLECYSTECTOMY    . EYE SURGERY    . REFRACTIVE SURGERY Bilateral     There were no vitals filed for this visit.  Subjective Assessment - 10/07/19 1244    Subjective  The abominal work has helped. I have been regular daily. I have had some more heart burn.    Patient Stated Goals  improve toileting    Currently in Pain?  No/denies          [facility] PT Assessment - 10/07/19 0001      Palpation   SI assessment   ASIS are equal    Palpation comment  tenderness located on the left iliopsoas                Pelvic Floor Special Questions - 10/07/19 0001    Pelvic Floor Internal Exam  Patient confirms identification and approves PT to assess pelvic floor and treatment    Exam Type  Rectal    Palpation  tenderness located on the left levator ani and obturator internist that will radiate to the left hip    Strength  weak squeeze, no lift        [facility] Adult PT Treatment/Exercise - 10/07/19 0001      Neuro Re-ed    Neuro Re-ed Details   bulging of the pelvic floor and contraction with diaphragmatic breathing      Manual Therapy   Manual Therapy  Internal Pelvic Floor;Soft tissue mobilization    Soft tissue mobilization  left iliopsoas to release the tissue    Internal Pelvic Floor  left levator ani and obturator internist              PT Education - 10/07/19 1315      Education Details  bulging of the pelvic    Person(s) Educated  Patient    Methods  Explanation;Demonstration;Handout    Comprehension  Verbalized understanding;Returned demonstration       PT Short Term Goals - 10/01/19 1235      PT SHORT TERM GOAL #1   Title  independent with initial HEP    Time  4    Period  Weeks    Status  Achieved      PT SHORT TERM GOAL #2   Title  understand how to perform abdominal massage to improve peristalic motion of the intestines    Time  4    Period  Weeks    Status  Achieved      PT SHORT TERM GOAL #3   Title  understand correct toileting technique to push the stool out    Time  4    Period  Weeks    Status  Achieved    Target Date  09/25/19        PT Long Term Goals - 10/07/19 1246      PT LONG TERM GOAL #1   Title  independent with advanced HEP    Time  8    Period  Weeks    Status  Achieved      PT LONG TERM GOAL #2   Title  able to have a complete bowel movement due to  ability to relax the pelvic floor and improved peristalic motion of the intestines    Baseline  complete bowel movement 1-2 times per week    Time  8    Period  Weeks    Status  On-going      PT LONG TERM GOAL #3   Title  pelvic floor strength >/= 4/5 so she is able to push the stool out with greater ease    Baseline  40% easier    Time  8    Period  Weeks    Status  On-going      PT LONG TERM GOAL #4   Title  straining to have a bowel movement improved >/= 50% due to improve pelvic floor muscle coordination    Baseline  strain 40% better    Time  8    Period  Weeks    Status  On-going      PT LONG TERM GOAL #5   Title  abdominal pain improved >/= 80% due improved toileting    Baseline  75% better    Time  8    Period  Weeks    Status  On-going            Plan - 10/07/19 1249    Clinical Impression Statement  Patient is having a bowel movement daily this week. Patient is able to bulge the pelvic floor after manual cues and VC to bulge the belly. Patient has trigger points in the left levator ani that will trigger to the left hip. Patient abdominal pain is 75% better. Patient pelvic floor strength is 2/5. Patient will benefit from skilled therapy to imporve pelvic floor coordination and to improve  toileting.    Personal Factors and Comorbidities  Age;Fitness;Time since onset of injury/illness/exacerbation    Comorbidities  HTN, Type I DM, HTN, Asthma, Depression, Bipolar Disorder    Examination-Activity Limitations  Toileting;Continence    Stability/Clinical Decision Making  Evolving/Moderate complexity    Rehab Potential  Good    PT Frequency  1x / week      PT Duration  12 weeks    PT Treatment/Interventions  Biofeedback;Functional mobility training;Therapeutic activities;Therapeutic exercise;Patient/family education;Neuromuscular re-education;Manual techniques;Dry needling    PT Next Visit Plan  work on pelvic floor externally, work on pelvic floor contraction and  elongation    Consulted and Agree with Plan of Care  Patient       Patient will benefit from skilled therapeutic intervention in order to improve the following deficits and impairments:  Decreased coordination, Increased fascial restricitons, Decreased strength  Visit Diagnosis: Muscle weakness (generalized)  Unspecified lack of coordination  Chronic constipation  Other symptoms and signs involving the musculoskeletal system  Pain in left hip     Problem List Patient Active Problem List   Diagnosis Date Noted  . Pseudophakia of left eye 11/04/2018  . Constipation   . CKD (chronic kidney disease), stage III 01/01/2017  . Diabetic nephropathy associated with type 1 diabetes mellitus ([place]) 03/01/2016  . Bipolar disorder in partial remission ([place]) 02/01/2016  . Chronic hypertension 02/01/2016  . DM retinopathy ([place])   . Encounter for preconception consultation   . After-cataract obscuring vision, left 09/22/2014  . MDD (major depressive disorder), severe ([place]) 05/31/2014  . Drug overdose, intentional ([place]) 03/27/2014  . Poisoning by drug or medicinal substance 03/27/2014  . Asthma 11/10/2013  . Colles' fracture of left radius 10/11/2013  . Type I diabetes mellitus with complication, uncontrolled ([place]) 10/11/2013  . MDD (major depressive disorder), recurrent episode, severe ([place]) 09/26/2013  . Generalized anxiety disorder 09/26/2013  . Aphakia, right eye 09/14/2012  . Retinal detachment, tractional, left eye 06/11/2012  . Anemia 06/07/2012  . Eczema 06/07/2012  . Renal calculi 06/07/2012  . Hyperglycemia 04/30/2012  . Diabetic retinopathy associated with type 1 diabetes mellitus ([place]) 04/30/2012  . Polyneuropathy in diabetes(357.2) 04/30/2012  . H/O insertion of insulin pump 04/30/2012  . Polyneuropathy in diabetes ([place]) 04/30/2012  . CHOLELITHIASIS 08/10/2010  . ATRIAL TACHYCARDIA 08/08/2010  . Gastroparesis 08/08/2010  . LIVER FUNCTION TESTS, ABNORMAL, HX OF  08/08/2010  . Type 1 diabetes mellitus with ketoacidosis, uncontrolled ([place]) 08/02/2010  . BOWEL INTUSSUSCEPTION 08/02/2010  . PANCREATITIS, ACUTE, HX OF 08/02/2010  . ABDOMINAL PAIN, LEFT UPPER QUADRANT 08/01/2010    Earlie Counts, PT 10/07/19 1:56 PM   [facility] [address], [place], [place], [address] Phone: [phone]   Fax:  [phone]  Name: [name] MRN: [id number] Date of Birth: 01-13-1986  PHYSICAL THERAPY DISCHARGE SUMMARY  Visits from Start of Care: 5  Current functional level related to goals / functional outcomes: See above. Patient did not return to therapy after her last session.    Remaining deficits: See above.    Education / Equipment: HEP Plan:                                                    Patient goals were not met. Patient is being discharged due to not returning since the last visit.  Thank you for the referral. Earlie Counts, PT 12/08/19 10:18 AM  ????

## 2019-10-08 ENCOUNTER — Ambulatory Visit (INDEPENDENT_AMBULATORY_CARE_PROVIDER_SITE_OTHER): Payer: Medicare Other | Admitting: Psychiatry

## 2019-10-08 ENCOUNTER — Encounter: Payer: Self-pay | Admitting: Psychiatry

## 2019-10-08 ENCOUNTER — Ambulatory Visit: Payer: Medicare Other | Admitting: Physical Therapy

## 2019-10-08 DIAGNOSIS — F314 Bipolar disorder, current episode depressed, severe, without psychotic features: Secondary | ICD-10-CM | POA: Diagnosis not present

## 2019-10-08 DIAGNOSIS — F411 Generalized anxiety disorder: Secondary | ICD-10-CM | POA: Diagnosis not present

## 2019-10-08 DIAGNOSIS — F4001 Agoraphobia with panic disorder: Secondary | ICD-10-CM | POA: Diagnosis not present

## 2019-10-08 NOTE — Progress Notes (Signed)
[name] [id number] 1986/02/18 33 y.o.   Virtual Visit via [place]  I connected with pt by WebEx and verified that I am speaking with the correct person using two identifiers.   I discussed the limitations, risks, security and privacy concerns of performing an evaluation and management service by [name] and the availability of in person appointments. I also discussed with the patient that there may be a patient responsible charge related to this service. The patient expressed understanding and agreed to proceed.  I discussed the assessment and treatment plan with the patient. The patient was provided an opportunity to ask questions and all were answered. The patient agreed with the plan and demonstrated an understanding of the instructions.   The patient was advised to call back or seek an in-person evaluation if the symptoms worsen or if the condition fails to improve as anticipated.  I provided 30 minutes of video time during this encounter. The call started at 330 and ended at 4:00. The patient was located at home and the provider was located office.   Subjective:   Patient ID:  [name] is a 33 y.o. (DOB 1985-10-21) female.  Chief Complaint:  Chief Complaint  Patient presents with  . Follow-up     Medciation Management  . Depression     Medciation Management  . Other    Bipolar 1  . Medication Problem    HPI [name] presents to the office today for follow-up of bipolar disorder and generalized anxiety disorder.  When seen June 05, 2019.  The following changes were made: Change pramipexole ER and increase to 0.75 mg pm to rid nausea and improve mood in the afternoon.  For tiredness in the afternoon switch Equetro to 400 mg AM , 1@ 6 and 1 at 11. For panic in daytime start gabapentin 300 mg each am as preventative.  She's only taking it prn now. We continued unchanged Latuda 120 mg and lamotrigine 300 mg twice daily for bipolar disorder. She  just got the ER pramipexole about a week ago.  Can't tell difference with nausea DT  gastroparesis worse lately with constipation Less afternoon tiredness with current split Equetro dose.    Last seen July 22, 2019.  Meds were not changed at that visit.  She called back to October 20 wanting to try valproate because of racing thoughts and difficulty sleeping.  Stating the trazodone was not working.  She was told it was okay to start Depakote ER 500 mg nightly and then gradually increased to 1500 mg nightly.  She called back again on October 6 and this physician noted the following: RTC Pt called to report new med she started having a lot of side effects mood swings, sounds in ears, suicidal thoughts, tremors. Causing issues with husband She is currently taking Depakote ER 1500 mg nightly. She commits to safety. The only symptom directly attributable to Depakote would be the tremor.  However its presence would indicate she is not going to be able to tolerate a higher dose of Depakote to control her mood symptoms.  Therefore we will wean the Depakote. In order to control the mood symptoms we will increase the Equetro at the evening dose which should not cause significant side effects problems during the day but help with mood stability.  This increase should also make it easier to wean the Depakote quickly.  She agrees to this plan.  She will call back if the suicidal thoughts become too intense to  tolerate. Reduce Depakote ER 500 mg tablets to 1 a night for 4 nights then stop Increase Equetro 200 mg capsules to 2 in the morning and 3 nightly. [name], MD, DFAPA  She's been back on Equetro and doing OK.  Last 2 weeks leading to Xmas with a lot of anger and lashing out a good bit.  Had run out of mirapex and Latuda for a couple of days in that time frame.  Crying is less now.  No SE CBZ now.  A lot of migraines off her Trokendi bc risk kidney stones.  Plans Botox for HA.  Usually  sleeping OK without trazodone.   Still taking Latuda, buspirone, gabapentin, lamotrigine 300 mg BID.    The last depression and anxiety that she had at her last visit.  No suicidal thoughts.  No severe mood swings.  Custody battle with exH.  He's nicer to her now.  B diabetic died last year with DM and kidney failure.  Weight going up. Hates weight gain effects of meds.    Consistent with meds.  No sig SI other than fleeting rarely.  Scattered concentration and racing thoughts randomly.   Sleep OK.  Remarried April. 34 yo D ADD is stressful and disrespectful. Disability review pending.  Lawyer feels confident. A lot of losses in recent years including H leaving, losing house, trying to teach ADD D, etc.  Past Psychiatric Medication Trials: Latuda 120, lamotrigine 300 twice daily, Equetro 900 daily,  brief Vraylar, lithium felt slowed down, Seroquel,Abilify 15, Depakote 1500 sertraline, duloxetine,  gabapentin 600 mg 3 times daily tiredness,  buspirone 30 twice daily,, clonazepam, trazodone, Xanax , hydroxyzine NR,  History of suicidal ideation and about 5 suicide attempts with about 4 psychiatric hospitalizations  Review of Systems:  Review of Systems  Gastrointestinal: Positive for nausea and vomiting.  Musculoskeletal: Positive for arthralgias.  Neurological: Negative for tremors and weakness.  Psychiatric/Behavioral: Positive for depression and sleep disturbance.    Medications: I have reviewed the patient's current medications.  Current Outpatient Medications  Medication Sig Dispense Refill  . acetaminophen (TYLENOL) 500 MG tablet Take 1,000 mg by mouth every 6 (six) hours as needed for mild pain or moderate pain.     Marland Kitchen albuterol (PROVENTIL) (2.5 MG/3ML) 0.083% nebulizer solution Take 3 mLs (2.5 mg total) by nebulization every 6 (six) hours as needed for wheezing or shortness of breath. 150 mL 1  . albuterol (VENTOLIN HFA) 108 (90 Base) MCG/ACT inhaler INHALE 2 PUFFS BY MOUTH EVERY 6  HOURS AS NEEDED FOR SHORTNESS OF BREATH (Patient taking differently: Inhale 2 puffs into the lungs every 6 (six) hours as needed for wheezing or shortness of breath. ) 8.5 g 3  . AMBULATORY NON FORMULARY MEDICATION DOMPERIDONE 10 mg, twice a day. One with breakfast One with supper Dispense 60 tablets  2 refills 60 tablet 2  . busPIRone (BUSPAR) 15 MG tablet TAKE 2 TABLETS (30 MG TOTAL) BY MOUTH 2 (TWO) TIMES DAILY. 120 tablet 1  . carbamazepine (EQUETRO) 200 MG CP12 12 hr capsule Take 2 capsules in the morning and 3 at bedtime (Patient taking differently: Take 400-600 mg by mouth See admin instructions. Take 2 capsules (400 mg) by mouth in the morning & take 3 capsules (600 mg) by mouth at bedtime.) 150 capsule 2  . ciclopirox (PENLAC) 8 % solution Apply one coat to each toenail daily. Remove weekly with polish remover. 6.6 mL 11  . EMGALITY 120 MG/ML SOSY Inject into the skin every  30 (thirty) days.     . enalapril (VASOTEC) 20 MG tablet Take 20 mg by mouth daily.     Marland Kitchen etonogestrel (NEXPLANON) 68 MG IMPL implant 68 mg by Subdermal route once.     . fluticasone (FLONASE) 50 MCG/ACT nasal spray Place 2 sprays into both nostrils daily. (Patient taking differently: Place 2 sprays into both nostrils daily as needed (seasonal allergies.). ) 16 g 2  . furosemide (LASIX) 40 MG tablet Take 40 mg by mouth every other day.     . gabapentin (NEURONTIN) 400 MG capsule Take 1 capsule (400 mg total) by mouth 3 (three) times daily. 42 capsule 1  . GLUCAGON EMERGENCY 1 MG injection Inject 1 mg into the skin as needed (hypoglycemia.).     . Insulin Human (INSULIN PUMP) SOLN Inject 1 each into the skin 3 times daily with meals, bedtime and 2 AM. Novolog: 36-38 units per day    . lamoTRIgine (LAMICTAL) 150 MG tablet TAKE 2 TABLETS (300 MG TOTAL) BY MOUTH 2 (TWO) TIMES DAILY. 120 tablet 2  . LATUDA 120 MG TABS TAKE 1 TABLET BY MOUTH EVERY DAY 30 tablet 1  . mometasone-formoterol (DULERA) 100-5 MCG/ACT AERO Inhale 2  puffs into the lungs 2 (two) times daily. 1 Inhaler 5  . montelukast (SINGULAIR) 10 MG tablet Take 1 tablet (10 mg total) by mouth at bedtime. 90 tablet 3  . NOVOLOG 100 UNIT/ML injection Inject 36-38 Units into the skin daily. Via insulin pump per sliding scale    . pantoprazole (PROTONIX) 20 MG tablet Take 1 tablet (20 mg total) by mouth daily before breakfast. Take at least 30 minutes before breakfast 30 tablet 3  . Pramipexole Dihydrochloride 0.75 MG TB24 TAKE 1 TABLET BY MOUTH EVERY EVENING (Patient taking differently: Take 0.75 mg by mouth every evening. ) 30 tablet 1  . promethazine (PHENERGAN) 25 MG tablet Take 0.5 tablets (12.5 mg total) by mouth every 8 (eight) hours as needed for nausea or vomiting. (Patient taking differently: Take 12.5-25 mg by mouth every 8 (eight) hours as needed for nausea or vomiting. ) 30 tablet 1  . Prucalopride Succinate (MOTEGRITY) 2 MG TABS Take 1 tablet (2 mg total) by mouth every morning. 30 tablet 1  . traZODone (DESYREL) 50 MG tablet Take 1 tablet (50 mg total) by mouth at bedtime. (Patient taking differently: Take 50 mg by mouth at bedtime as needed for sleep. ) 30 tablet 2  . Ubrogepant (UBRELVY) 100 MG TABS Take 100 mg by mouth daily as needed (migraine headaches.).     Marland Kitchen Urea 40 % LOTN Apply 1 application topically daily. Apply only to calluses 325 mL 0   No current facility-administered medications for this visit.    Medication Side Effects: tired in the afternoon.  Allergies:  Allergies  Allergen Reactions  . Toradol [Ketorolac Tromethamine]     Cannot take d/t kidney issues   . Ceftin Rash    Past Medical History:  Diagnosis Date  . Anxiety   . Asthma   . Asthma 11/10/2013  . Bipolar 1 disorder ([place])   . Complication of anesthesia   . Depression   . Diabetic gastroparesis ([place])   . Diabetic neuropathy, type I diabetes mellitus ([place])   . Gastroparesis   . Headache(784.0)   . Hypertension   . Kidney stones   . Polyneuropathy in  diabetes(357.2)   . PONV (postoperative nausea and vomiting)   . 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 Tachycardia  baseline tachycardia   . 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 Hypertension Other   . Diabetes Mother        type 2  . Diabetes Brother        type 1  . Renal Disease Brother        renal failure  . Hypertension Brother   . Colon cancer Neg Hx   . Rectal cancer Neg Hx   . Esophageal cancer Neg Hx     Social History   Socioeconomic History  . Marital status: Legally Separated    Spouse name: Not on file  . Number of children: 2  . Years of education: Not on file  . Highest education level: Not on file  Occupational History  . Occupation: Home maker  Tobacco Use  . Smoking status: Never Smoker  . Smokeless tobacco: Never Used  Substance and Sexual Activity  . Alcohol use: No  . Drug use: No  . Sexual activity: Yes    Partners: Male  Other Topics Concern  . Not on file  Social History Narrative   Pt has a daughter and boyfriend.          Social Determinants of Health   Financial Resource Strain:   . Difficulty of Paying Living Expenses: Not on file  Food Insecurity:   . Worried About Charity fundraiser in the Last Year: Not on file  . Ran Out of Food in the Last Year: Not on file  Transportation Needs:   . Lack of Transportation (Medical): Not on file  . Lack of Transportation (Non-Medical): Not on file  Physical Activity:   . Days of Exercise per Week: Not on file  . Minutes of Exercise per Session: Not on file  Stress:   . Feeling of Stress : Not on file  Social Connections:   . Frequency of Communication with Friends and Family: Not on file  . Frequency of Social Gatherings with Friends and Family: Not on file  . Attends Religious Services: Not on file  . Active Member of Clubs or Organizations: Not on file  . Attends Theatre manager Meetings: Not on file  . Marital Status: Not on file  Intimate Partner Violence:   . Fear of Current or Ex-Partner: Not on file  . Emotionally Abused: Not on file  . Physically Abused: Not on file  . Sexually Abused: Not on file    Past Medical History, Surgical history, Social history, and Family history were reviewed and updated as appropriate.   Please see review of systems for further details on the patient's review from today.   Objective:   Physical Exam:  There were no vitals taken for this visit.  Physical Exam Neurological:     Mental Status: She is alert and oriented to person, place, and time.     Cranial Nerves: No dysarthria.  Psychiatric:        Attention and Perception: Attention and perception normal.        Speech: Speech normal.        Behavior: Behavior is cooperative.        Thought Content: Thought content normal. Thought content is not paranoid or delusional. Thought content does not include homicidal or suicidal ideation. Thought content does not include homicidal or suicidal plan.        Cognition and Memory: Cognition and memory  normal.        Judgment: Judgment normal.     Comments: Insight intact     Lab Review:     Component Value Date/Time   NA 137 09/09/2019 0959   K 4.0 09/09/2019 0959   CL 104 09/09/2019 0959   CO2 20 (L) 09/09/2019 0959   GLUCOSE 227 (H) 09/09/2019 0959   BUN 38 (H) 09/09/2019 0959   CREATININE 1.54 (H) 09/09/2019 0959   CREATININE 1.81 (H) 06/17/2018 1231   CALCIUM 9.1 09/09/2019 0959   PROT 6.5 07/08/2019 1732   ALBUMIN 3.9 07/08/2019 1732   AST 17 07/08/2019 1732   ALT 16 07/08/2019 1732   ALKPHOS 121 07/08/2019 1732   BILITOT 0.6 07/08/2019 1732   GFRNONAA 44 (L) 09/09/2019 0959   GFRNONAA 36 (L) 06/17/2018 1231   GFRAA 51 (L) 09/09/2019 0959   GFRAA 42 (L) 06/17/2018 1231       Component Value Date/Time   WBC 5.7 09/09/2019 0959   RBC 4.42 09/09/2019 0959   HGB 13.3 09/09/2019 0959   HCT  40.8 09/09/2019 0959   PLT 295 09/09/2019 0959   MCV 92.3 09/09/2019 0959   MCH 30.1 09/09/2019 0959   MCHC 32.6 09/09/2019 0959   RDW 12.9 09/09/2019 0959   LYMPHSABS 1.5 09/09/2019 0959   MONOABS 0.3 09/09/2019 0959   EOSABS 0.3 09/09/2019 0959   BASOSABS 0.1 09/09/2019 0959    No results found for: POCLITH, LITHIUM   No results found for: PHENYTOIN, PHENOBARB, VALPROATE, CBMZ   .res Assessment: Plan:    [name] was seen today for follow-up, depression, other and medication problem.  Diagnoses and all orders for this visit:  Bipolar I disorder with depression, severe ([place])  Generalized anxiety disorder  Panic disorder with agoraphobia    Greater than 50% of 30 min non face to face time with patient was spent on counseling and coordination of care. We discussed Patient with a history of multiple med failures and history of psychiatric hospitalization and suicide attempts recent worsening of bipolar depression.  Chronic stressors contribute to mood and anxiety problems.   Concern about potential weight gain with medications.  She has taking the major mood stabilizers with low weight gain potential.  Therefore I am hesitant to switch major mood stabilizers.  Discussed the pros and cons of switching mood stabilizers.  Could consider ultra low-dose lithium.   She recently failed to tolerate VPA.  Is doing better on CBZ but there are drug interaction problems to concern [name] and she's aware.   Continue Latuda 120 mg and lamotrigine 300 mg twice daily for bipolar disorder.  Still some moodiness but it's better than 2 mos ago now that she's back on Equetro.    Discussed the necessary polypharmacy at this time to manage her bipolar symptoms and anxiety.  Discussed drug to drug interaction issues between little to the and carbamazepine.  Carbamazepine can affect blood levels of multiple other medications but at this time it appears unavoidable.  She is still getting benefit out of the  various medication she is taking.  Discussed potential metabolic side effects associated with atypical antipsychotics, as well as potential risk for movement side effects. Advised pt to contact office if movement side effects occur.   Counseled patient regarding potential benefits, risks, and side effects of Lamictal and carbamazepine to include potential risk of Stevens-Johnson syndrome. Advised patient to stop taking Lamictal and contact office immediately if rash develops and to seek urgent medical attention if  rash is severe and/or spreading quickly.  Needs counseling.  She agrees.  I feel alone. She needs counselor who can accept Medicaid.  Disc grief.  Answered her questions about Genesight testing.    No caffeine after 3 pm.   No med changes this visit.    Follow-up 8-12 weeks as soon as available.  [name], MD, DFAPA   Please see After Visit Summary for patient specific instructions.  Future Appointments  Date Time Provider [place]  10/13/2019  3:30 PM [facility] PAT 2 [facility] None  10/14/2019  1:00 PM AP-SCREENING [facility] None  12/25/2019  2:45 PM Wagon[place], Bonna Gains, DPM [facility]    No orders of the defined types were placed in this encounter.   -------------------------------

## 2019-10-09 NOTE — Telephone Encounter (Signed)
Patient wants another appointment  For her the hip  I have scheduled it for her, with Dr Luna Glasgow since he has seen her for the hip.

## 2019-10-09 NOTE — Patient Instructions (Addendum)
Your procedure is scheduled on: 10/16/2019  Report to [facility] at  6:15   AM.  Call this number if you have problems the morning of surgery: [phone]   Remember:   Do not Eat or Drink after midnight   :  Take these medicines the morning of surgery with A SIP OF WATER: buspar, vasotec, gabapentin, and carbamazopine  Use inhalers if needed              Insulin pump on basal rate   Do not wear jewelry, make-up or nail polish.  Do not wear lotions, powders, or perfumes. You may wear deodorant.  Do not shave 48 hours prior to surgery. Men may shave face and neck.  Do not bring valuables to the hospital.  Contacts, dentures or bridgework may not be worn into surgery.  Leave suitcase in the car. After surgery it may be brought to your room.  For patients admitted to the hospital, checkout time is 11:00 AM the day of discharge.   Patients discharged the day of surgery will not be allowed to drive home.    Special Instructions: Shower using CHG night before surgery and shower the day of surgery use CHG.  Use special wash - you have one bottle of CHG for all showers.  You should use approximately 1/2 of the bottle for each shower.  Open Carpal Tunnel Release, Care After This sheet gives you information about how to care for yourself after your procedure. Your health care provider may also give you more specific instructions. If you have problems or questions, contact your health care provider. What can I expect after the procedure? After the procedure, it is common to have:  Wrist stiffness.  Bruising. Follow these instructions at home: Bathing  Do not take baths, swim, or use a hot tub until your health care provider approves. Ask your health care provider if you may take showers.  Keep your bandage (dressing) dry until your health care provider says it can be removed. If you have a splint or brace:  Wear the splint or brace as told by your health care provider. You may need to  wear it for 2-3 weeks. Remove it only as told by your health care provider.  Loosen the splint or brace if your fingers tingle, become numb, or turn cold and blue.  Keep the splint or brace clean.  If the splint or brace is not waterproof: ? Do not let it get wet. ? Cover it with a watertight covering when you take a bath or a shower. Incision care   Follow instructions from your health care provider about how to take care of your incision. Make sure you: ? Wash your hands with soap and water before you change your dressing. If soap and water are not available, use hand sanitizer. ? Change your dressing as told by your health care provider. ? Leave stitches (sutures), skin glue, or adhesive strips in place. These skin closures may need to stay in place for 2 weeks or longer. If adhesive strip edges start to loosen and curl up, you may trim the loose edges. Do not remove adhesive strips completely unless your health care provider tells you to do that.  Check your incision area every day for signs of infection. Check for: ? Redness, swelling, or pain. ? Fluid or blood. ? Warmth. ? Pus or a bad smell. Managing pain, stiffness, and swelling   If directed, put ice on the affected area. ?  If you have a removable splint or brace, remove it as told by your health care provider. ? Put ice in a plastic bag. ? Place a towel between your skin and the bag. ? Leave the ice on for 20 minutes, 2-3 times a day.  Move your fingers often to avoid stiffness and to lessen swelling.  Raise (elevate) your wrist above the level of your heart while you are sitting or lying down. Activity  Do not drive until your health care provider approves.  Do not drive or use heavy machinery while taking prescription pain medicine.  Return to your normal activities as told by your health care provider. Avoid activities that cause pain.  If physical therapy was prescribed, do exercises as told by your  therapist. Physical therapy can help you heal faster and regain movement. General instructions  Take over-the-counter and prescription medicines only as told by your health care provider.  If you are taking prescription pain medicine, take actions to prevent or treat constipation. Your health care provider may recommend that you: ? Drink enough fluid to keep your urine pale yellow. ? Eat foods that are high in fiber, such as fresh fruits and vegetables, whole grains, and beans. ? Limit foods that are high in fat and processed sugars, such as fried or sweet foods. ? Take an over-the-counter or prescription medicine for constipation.  Do not use any products that contain nicotine or tobacco, such as cigarettes and e-cigarettes. If you need help quitting, ask your health care provider.  Keep all follow-up visits as told by your health care provider and physical therapist. This is important. Contact a health care provider if:  You have redness or swelling around your incision.  You have fluid or blood coming from your incision.  Your incision feels warm to the touch.  You have pus or a bad smell coming from your incision.  You have a fever.  You have chills.  You have pain that does not get better with medicine.  Your carpal tunnel symptoms do not go away after 2 months.  Your carpal tunnel symptoms go away and then come back. Get help right away if:  You have pain or numbness that is getting worse.  Your fingers or fingertips become very pale or bluish in color.  You are not able to move your fingers. Summary  It is common to have wrist stiffness and bruising after a carpal tunnel release.  Icing and raising (elevating) your wrist may help to lessen swelling and pain.  Call your health care provider if you have a fever or notice any signs of infection in your incision area. This information is not intended to replace advice given to you by your health care provider. Make  sure you discuss any questions you have with your health care provider. Document Revised: 09/07/2017 Document Reviewed: 06/04/2017 Elsevier Patient Education  2020 [place] After These instructions provide you with information about caring for yourself after your procedure. Your health care provider may also give you more specific instructions. Your treatment has been planned according to current medical practices, but problems sometimes occur. Call your health care provider if you have any problems or questions after your procedure. What can I expect after the procedure? After your procedure, you may:  Feel sleepy for several hours.  Feel clumsy and have poor balance for several hours.  Feel forgetful about what happened after the procedure.  Have poor judgment for several hours.  Feel nauseous or vomit.  Have a sore throat if you had a breathing tube during the procedure. Follow these instructions at home: For at least 24 hours after the procedure:      Have a responsible adult stay with you. It is important to have someone help care for you until you are awake and alert.  Rest as needed.  Do not: ? Participate in activities in which you could fall or become injured. ? Drive. ? Use heavy machinery. ? Drink alcohol. ? Take sleeping pills or medicines that cause drowsiness. ? Make important decisions or sign legal documents. ? Take care of children on your own. Eating and drinking  Follow the diet that is recommended by your health care provider.  If you vomit, drink water, juice, or soup when you can drink without vomiting.  Make sure you have little or no nausea before eating solid foods. General instructions  Take over-the-counter and prescription medicines only as told by your health care provider.  If you have sleep apnea, surgery and certain medicines can increase your risk for breathing problems. Follow instructions from  your health care provider about wearing your sleep device: ? Anytime you are sleeping, including during daytime naps. ? While taking prescription pain medicines, sleeping medicines, or medicines that make you drowsy.  If you smoke, do not smoke without supervision.  Keep all follow-up visits as told by your health care provider. This is important. Contact a health care provider if:  You keep feeling nauseous or you keep vomiting.  You feel light-headed.  You develop a rash.  You have a fever. Get help right away if:  You have trouble breathing. Summary  For several hours after your procedure, you may feel sleepy and have poor judgment.  Have a responsible adult stay with you for at least 24 hours or until you are awake and alert. This information is not intended to replace advice given to you by your health care provider. Make sure you discuss any questions you have with your health care provider. Document Revised: 12/24/2017 Document Reviewed: 01/16/2016 Elsevier Patient Education  [place].

## 2019-10-13 ENCOUNTER — Encounter (HOSPITAL_COMMUNITY)
Admission: RE | Admit: 2019-10-13 | Discharge: 2019-10-13 | Disposition: A | Discharge status: Home / Self Care | Payer: Medicare Other | Source: Ambulatory Visit | Attending: Orthopedic Surgery | Admitting: Orthopedic Surgery

## 2019-10-13 ENCOUNTER — Other Ambulatory Visit: Payer: Self-pay

## 2019-10-13 ENCOUNTER — Telehealth: Payer: Self-pay

## 2019-10-13 NOTE — Telephone Encounter (Signed)
Patient called asking if we would reschedule her covid test. Stated she was told we could do this for her. I told her that I thought this was something she could do since it is part of her pre-op.  Please call and advise.

## 2019-10-13 NOTE — Telephone Encounter (Signed)
Called patient and she stated after she spoke with our office someone called her from the hospital and it has been taken care of.

## 2019-10-13 NOTE — Telephone Encounter (Signed)
Can you give her carolyn's number They will RS this at hospital

## 2019-10-14 ENCOUNTER — Encounter (HOSPITAL_COMMUNITY)
Admission: RE | Admit: 2019-10-14 | Discharge: 2019-10-14 | Disposition: A | Discharge status: Home / Self Care | Payer: Medicare Other | Source: Ambulatory Visit | Attending: Orthopedic Surgery | Admitting: Orthopedic Surgery

## 2019-10-14 ENCOUNTER — Other Ambulatory Visit (HOSPITAL_COMMUNITY): Admission: RE | Admit: 2019-10-14 | Payer: Medicare Other | Source: Ambulatory Visit

## 2019-10-14 ENCOUNTER — Encounter (HOSPITAL_COMMUNITY): Payer: Self-pay

## 2019-10-14 ENCOUNTER — Other Ambulatory Visit: Payer: Self-pay

## 2019-10-14 ENCOUNTER — Other Ambulatory Visit (HOSPITAL_COMMUNITY)
Admission: RE | Admit: 2019-10-14 | Discharge: 2019-10-14 | Disposition: A | Discharge status: Home / Self Care | Payer: Medicare Other | Source: Ambulatory Visit | Attending: Orthopedic Surgery | Admitting: Orthopedic Surgery

## 2019-10-14 ENCOUNTER — Ambulatory Visit (INDEPENDENT_AMBULATORY_CARE_PROVIDER_SITE_OTHER): Payer: Medicare Other | Admitting: Orthopaedic Surgery

## 2019-10-14 ENCOUNTER — Encounter: Payer: Self-pay | Admitting: Orthopaedic Surgery

## 2019-10-14 VITALS — BP 127/89 | HR 97 | Temp 97.0°F | Ht 66.0 in | Wt 192.1 lb

## 2019-10-14 DIAGNOSIS — M7062 Trochanteric bursitis, left hip: Secondary | ICD-10-CM

## 2019-10-14 DIAGNOSIS — Z01812 Encounter for preprocedural laboratory examination: Secondary | ICD-10-CM | POA: Insufficient documentation

## 2019-10-14 LAB — CBC WITH DIFFERENTIAL/PLATELET
Abs Immature Granulocytes: 0.03 10*3/uL (ref 0.00–0.07)
Basophils Absolute: 0.1 10*3/uL (ref 0.0–0.1)
Basophils Relative: 1 %
Eosinophils Absolute: 0.6 10*3/uL — ABNORMAL HIGH (ref 0.0–0.5)
Eosinophils Relative: 7 %
HCT: 41.9 % (ref 36.0–46.0)
Hemoglobin: 13.4 g/dL (ref 12.0–15.0)
Immature Granulocytes: 0 %
Lymphocytes Relative: 26 %
Lymphs Abs: 2 10*3/uL (ref 0.7–4.0)
MCH: 29.6 pg (ref 26.0–34.0)
MCHC: 32 g/dL (ref 30.0–36.0)
MCV: 92.7 fL (ref 80.0–100.0)
Monocytes Absolute: 0.8 10*3/uL (ref 0.1–1.0)
Monocytes Relative: 10 %
Neutro Abs: 4.4 10*3/uL (ref 1.7–7.7)
Neutrophils Relative %: 56 %
Platelets: 315 10*3/uL (ref 150–400)
RBC: 4.52 MIL/uL (ref 3.87–5.11)
RDW: 12.4 % (ref 11.5–15.5)
WBC: 7.9 10*3/uL (ref 4.0–10.5)
nRBC: 0 % (ref 0.0–0.2)

## 2019-10-14 LAB — HCG, SERUM, QUALITATIVE: Preg, Serum: NEGATIVE

## 2019-10-14 LAB — BASIC METABOLIC PANEL
Anion gap: 9 (ref 5–15)
BUN: 43 mg/dL — ABNORMAL HIGH (ref 6–20)
CO2: 25 mmol/L (ref 22–32)
Calcium: 9.2 mg/dL (ref 8.9–10.3)
Chloride: 102 mmol/L (ref 98–111)
Creatinine, Ser: 1.69 mg/dL — ABNORMAL HIGH (ref 0.44–1.00)
GFR calc Af Amer: 45 mL/min — ABNORMAL LOW (ref >60)
GFR calc non Af Amer: 39 mL/min — ABNORMAL LOW (ref >60)
Glucose, Bld: 147 mg/dL — ABNORMAL HIGH (ref 70–99)
Potassium: 5 mmol/L (ref 3.5–5.1)
Sodium: 136 mmol/L (ref 135–145)

## 2019-10-14 LAB — SARS CORONAVIRUS 2 (TAT 6-24 HRS): SARS Coronavirus 2: NEGATIVE

## 2019-10-14 NOTE — Progress Notes (Signed)
PROCEDURE NOTE:  The patient request injection, verbal consent was obtained.  The left trochanteric area of the hip was prepped appropriately after time out was performed.   Sterile technique was observed and injection of 1 cc of Depo-Medrol 40 mg with several cc's of plain xylocaine. Anesthesia was provided by ethyl chloride and a 20-gauge needle was used to inject the hip area. The injection was tolerated well.  A band aid dressing was applied.  The patient was advised to apply ice later today and tomorrow to the injection sight as needed.  See in three weeks.  Electronically Signed Sanjuana Kava, MD 1/5/20218:34 AM

## 2019-10-14 NOTE — Patient Instructions (Signed)
[name]  10/14/2019     @PREFPERIOPPHARMACY @   Your procedure is scheduled on  10/16/2019 .  Report to [facility] at  [phone]  A.M.  Call this number if you have problems the morning of surgery:  [phone]   Remember:  Do not eat or drink after midnight.                         Take these medicines the morning of surgery with A SIP OF WATER  Buspar, carbamaine, vasotec, gabapentin. Use your inhaler before you come and bring it with you.    Do not wear jewelry, make-up or nail polish.  Do not wear lotions, powders, or perfumes, or deodorant. Please brush our teeth.  Do not shave 48 hours prior to surgery.  Men may shave face and neck.  Do not bring valuables to the hospital.  [facility] is not responsible for any belongings or valuables.  Contacts, dentures or bridgework may not be worn into surgery.  Leave your suitcase in the car.  After surgery it may be brought to your room.  For patients admitted to the hospital, discharge time will be determined by your treatment team.  Patients discharged the day of surgery will not be allowed to drive home.   Name and phone number of your driver:   family Special instructions:  None  Please read over the following fact sheets that you were given. Anesthesia Post-op Instructions and Care and Recovery After Surgery       Open Carpal Tunnel Release, Care After This sheet gives you information about how to care for yourself after your procedure. Your health care provider may also give you more specific instructions. If you have problems or questions, contact your health care provider. What can I expect after the procedure? After the procedure, it is common to have:  Wrist stiffness.  Bruising. Follow these instructions at home: Bathing  Do not take baths, swim, or use a hot tub until your health care provider approves. Ask your health care provider if you may take showers.  Keep your bandage (dressing) dry  until your health care provider says it can be removed. If you have a splint or brace:  Wear the splint or brace as told by your health care provider. You may need to wear it for 2-3 weeks. Remove it only as told by your health care provider.  Loosen the splint or brace if your fingers tingle, become numb, or turn cold and blue.  Keep the splint or brace clean.  If the splint or brace is not waterproof: ? Do not let it get wet. ? Cover it with a watertight covering when you take a bath or a shower. Incision care   Follow instructions from your health care provider about how to take care of your incision. Make sure you: ? Wash your hands with soap and water before you change your dressing. If soap and water are not available, use hand sanitizer. ? Change your dressing as told by your health care provider. ? Leave stitches (sutures), skin glue, or adhesive strips in place. These skin closures may need to stay in place for 2 weeks or longer. If adhesive strip edges start to loosen and curl up, you may trim the loose edges. Do not remove adhesive strips completely unless your health care provider tells you to do that.  Check your incision area every day  for signs of infection. Check for: ? Redness, swelling, or pain. ? Fluid or blood. ? Warmth. ? Pus or a bad smell. Managing pain, stiffness, and swelling   If directed, put ice on the affected area. ? If you have a removable splint or brace, remove it as told by your health care provider. ? Put ice in a plastic bag. ? Place a towel between your skin and the bag. ? Leave the ice on for 20 minutes, 2-3 times a day.  Move your fingers often to avoid stiffness and to lessen swelling.  Raise (elevate) your wrist above the level of your heart while you are sitting or lying down. Activity  Do not drive until your health care provider approves.  Do not drive or use heavy machinery while taking prescription pain medicine.  Return to  your normal activities as told by your health care provider. Avoid activities that cause pain.  If physical therapy was prescribed, do exercises as told by your therapist. Physical therapy can help you heal faster and regain movement. General instructions  Take over-the-counter and prescription medicines only as told by your health care provider.  If you are taking prescription pain medicine, take actions to prevent or treat constipation. Your health care provider may recommend that you: ? Drink enough fluid to keep your urine pale yellow. ? Eat foods that are high in fiber, such as fresh fruits and vegetables, whole grains, and beans. ? Limit foods that are high in fat and processed sugars, such as fried or sweet foods. ? Take an over-the-counter or prescription medicine for constipation.  Do not use any products that contain nicotine or tobacco, such as cigarettes and e-cigarettes. If you need help quitting, ask your health care provider.  Keep all follow-up visits as told by your health care provider and physical therapist. This is important. Contact a health care provider if:  You have redness or swelling around your incision.  You have fluid or blood coming from your incision.  Your incision feels warm to the touch.  You have pus or a bad smell coming from your incision.  You have a fever.  You have chills.  You have pain that does not get better with medicine.  Your carpal tunnel symptoms do not go away after 2 months.  Your carpal tunnel symptoms go away and then come back. Get help right away if:  You have pain or numbness that is getting worse.  Your fingers or fingertips become very pale or bluish in color.  You are not able to move your fingers. Summary  It is common to have wrist stiffness and bruising after a carpal tunnel release.  Icing and raising (elevating) your wrist may help to lessen swelling and pain.  Call your health care provider if you have a  fever or notice any signs of infection in your incision area. This information is not intended to replace advice given to you by your health care provider. Make sure you discuss any questions you have with your health care provider. Document Revised: 09/07/2017 Document Reviewed: 06/04/2017 Elsevier Patient Education  2020 [place] After These instructions provide you with information about caring for yourself after your procedure. Your health care provider may also give you more specific instructions. Your treatment has been planned according to current medical practices, but problems sometimes occur. Call your health care provider if you have any problems or questions after your procedure. What can I expect after the  procedure? After your procedure, you may:  Feel sleepy for several hours.  Feel clumsy and have poor balance for several hours.  Feel forgetful about what happened after the procedure.  Have poor judgment for several hours.  Feel nauseous or vomit.  Have a sore throat if you had a breathing tube during the procedure. Follow these instructions at home: For at least 24 hours after the procedure:      Have a responsible adult stay with you. It is important to have someone help care for you until you are awake and alert.  Rest as needed.  Do not: ? Participate in activities in which you could fall or become injured. ? Drive. ? Use heavy machinery. ? Drink alcohol. ? Take sleeping pills or medicines that cause drowsiness. ? Make important decisions or sign legal documents. ? Take care of children on your own. Eating and drinking  Follow the diet that is recommended by your health care provider.  If you vomit, drink water, juice, or soup when you can drink without vomiting.  Make sure you have little or no nausea before eating solid foods. General instructions  Take over-the-counter and prescription medicines only as told  by your health care provider.  If you have sleep apnea, surgery and certain medicines can increase your risk for breathing problems. Follow instructions from your health care provider about wearing your sleep device: ? Anytime you are sleeping, including during daytime naps. ? While taking prescription pain medicines, sleeping medicines, or medicines that make you drowsy.  If you smoke, do not smoke without supervision.  Keep all follow-up visits as told by your health care provider. This is important. Contact a health care provider if:  You keep feeling nauseous or you keep vomiting.  You feel light-headed.  You develop a rash.  You have a fever. Get help right away if:  You have trouble breathing. Summary  For several hours after your procedure, you may feel sleepy and have poor judgment.  Have a responsible adult stay with you for at least 24 hours or until you are awake and alert. This information is not intended to replace advice given to you by your health care provider. Make sure you discuss any questions you have with your health care provider. Document Revised: 12/24/2017 Document Reviewed: 01/16/2016 Elsevier Patient Education  [place].

## 2019-10-16 ENCOUNTER — Ambulatory Visit (HOSPITAL_COMMUNITY): Payer: Medicare Other | Admitting: Anesthesiology

## 2019-10-16 ENCOUNTER — Ambulatory Visit (HOSPITAL_COMMUNITY)
Admission: RE | Admit: 2019-10-16 | Discharge: 2019-10-16 | Disposition: A | Discharge status: Home / Self Care | Payer: Medicare Other | Attending: Orthopedic Surgery | Admitting: Orthopedic Surgery

## 2019-10-16 ENCOUNTER — Telehealth: Payer: Self-pay | Admitting: Radiology

## 2019-10-16 ENCOUNTER — Encounter (HOSPITAL_COMMUNITY): Payer: Self-pay | Admitting: Orthopedic Surgery

## 2019-10-16 ENCOUNTER — Encounter (HOSPITAL_COMMUNITY)
Admission: RE | Disposition: A | Discharge status: Home / Self Care | Payer: Self-pay | Source: Home / Self Care | Attending: Orthopedic Surgery

## 2019-10-16 DIAGNOSIS — I10 Essential (primary) hypertension: Secondary | ICD-10-CM | POA: Diagnosis not present

## 2019-10-16 DIAGNOSIS — Z886 Allergy status to analgesic agent status: Secondary | ICD-10-CM | POA: Insufficient documentation

## 2019-10-16 DIAGNOSIS — F319 Bipolar disorder, unspecified: Secondary | ICD-10-CM | POA: Insufficient documentation

## 2019-10-16 DIAGNOSIS — Z79899 Other long term (current) drug therapy: Secondary | ICD-10-CM | POA: Insufficient documentation

## 2019-10-16 DIAGNOSIS — G5602 Carpal tunnel syndrome, left upper limb: Secondary | ICD-10-CM | POA: Insufficient documentation

## 2019-10-16 DIAGNOSIS — K3184 Gastroparesis: Secondary | ICD-10-CM | POA: Insufficient documentation

## 2019-10-16 DIAGNOSIS — E10311 Type 1 diabetes mellitus with unspecified diabetic retinopathy with macular edema: Secondary | ICD-10-CM | POA: Insufficient documentation

## 2019-10-16 DIAGNOSIS — J45909 Unspecified asthma, uncomplicated: Secondary | ICD-10-CM | POA: Diagnosis not present

## 2019-10-16 DIAGNOSIS — G43909 Migraine, unspecified, not intractable, without status migrainosus: Secondary | ICD-10-CM | POA: Diagnosis not present

## 2019-10-16 DIAGNOSIS — F419 Anxiety disorder, unspecified: Secondary | ICD-10-CM | POA: Diagnosis not present

## 2019-10-16 DIAGNOSIS — Z7951 Long term (current) use of inhaled steroids: Secondary | ICD-10-CM | POA: Diagnosis not present

## 2019-10-16 DIAGNOSIS — Z881 Allergy status to other antibiotic agents status: Secondary | ICD-10-CM | POA: Insufficient documentation

## 2019-10-16 DIAGNOSIS — E1043 Type 1 diabetes mellitus with diabetic autonomic (poly)neuropathy: Secondary | ICD-10-CM | POA: Insufficient documentation

## 2019-10-16 DIAGNOSIS — Z794 Long term (current) use of insulin: Secondary | ICD-10-CM | POA: Insufficient documentation

## 2019-10-16 HISTORY — PX: CARPAL TUNNEL RELEASE: SHX101

## 2019-10-16 LAB — GLUCOSE, CAPILLARY
Glucose-Capillary: 47 mg/dL — ABNORMAL LOW (ref 70–99)
Glucose-Capillary: 48 mg/dL — ABNORMAL LOW (ref 70–99)
Glucose-Capillary: 63 mg/dL — ABNORMAL LOW (ref 70–99)
Glucose-Capillary: 115 mg/dL — ABNORMAL HIGH (ref 70–99)

## 2019-10-16 SURGERY — CARPAL TUNNEL RELEASE
Anesthesia: Monitor Anesthesia Care | Laterality: Left

## 2019-10-16 MED ORDER — KETAMINE HCL 50 MG/5ML IJ SOSY
PREFILLED_SYRINGE | INTRAMUSCULAR | Status: AC
  Filled 2019-10-16: qty 5

## 2019-10-16 MED ORDER — LACTATED RINGERS IV SOLN: INTRAVENOUS | Status: DC

## 2019-10-16 MED ORDER — HYDROMORPHONE HCL 1 MG/ML IJ SOLN: 0.2500 mg | INTRAMUSCULAR | Status: DC | PRN

## 2019-10-16 MED ORDER — 0.9 % SODIUM CHLORIDE (POUR BTL) OPTIME
TOPICAL | Status: DC | PRN
  Administered 2019-10-16: 1000 mL

## 2019-10-16 MED ORDER — HYDROCODONE-ACETAMINOPHEN 7.5-325 MG PO TABS: 1.0000 | ORAL_TABLET | Freq: Once | ORAL | Status: DC | PRN

## 2019-10-16 MED ORDER — DEXTROSE 50 % IV SOLN
25.0000 mL | Freq: Once | INTRAVENOUS | Status: AC
  Administered 2019-10-16: 25 mL via INTRAVENOUS

## 2019-10-16 MED ORDER — DEXTROSE 50 % IV SOLN
INTRAVENOUS | Status: AC
  Filled 2019-10-16: qty 50

## 2019-10-16 MED ORDER — BUPIVACAINE HCL (PF) 0.5 % IJ SOLN
INTRAMUSCULAR | Status: AC
  Filled 2019-10-16: qty 30

## 2019-10-16 MED ORDER — PROMETHAZINE HCL 25 MG/ML IJ SOLN: 6.2500 mg | INTRAMUSCULAR | Status: DC | PRN

## 2019-10-16 MED ORDER — VANCOMYCIN HCL IN DEXTROSE 1-5 GM/200ML-% IV SOLN
1000.0000 mg | INTRAVENOUS | Status: DC
  Filled 2019-10-16: qty 200

## 2019-10-16 MED ORDER — HYDROMORPHONE HCL 1 MG/ML IJ SOLN
0.2500 mg | INTRAMUSCULAR | Status: DC | PRN
  Administered 2019-10-16: 0.5 mg via INTRAVENOUS
  Filled 2019-10-16 (×2): qty 0.5

## 2019-10-16 MED ORDER — BUPIVACAINE HCL (PF) 0.5 % IJ SOLN
INTRAMUSCULAR | Status: DC | PRN
  Administered 2019-10-16: 10 mL

## 2019-10-16 MED ORDER — SCOPOLAMINE 1 MG/3DAYS TD PT72
1.0000 | MEDICATED_PATCH | TRANSDERMAL | Status: DC
  Administered 2019-10-16: 08:00:00 1.5 mg via TRANSDERMAL

## 2019-10-16 MED ORDER — MIDAZOLAM HCL 2 MG/2ML IJ SOLN: 0.5000 mg | Freq: Once | INTRAMUSCULAR | Status: DC | PRN

## 2019-10-16 MED ORDER — FENTANYL CITRATE (PF) 100 MCG/2ML IJ SOLN
INTRAMUSCULAR | Status: AC
  Filled 2019-10-16: qty 2

## 2019-10-16 MED ORDER — KETAMINE HCL 10 MG/ML IJ SOLN
INTRAMUSCULAR | Status: AC
  Filled 2019-10-16: qty 1

## 2019-10-16 MED ORDER — SODIUM CHLORIDE (PF) 0.9 % IJ SOLN
INTRAMUSCULAR | Status: AC
  Filled 2019-10-16: qty 10

## 2019-10-16 MED ORDER — PROMETHAZINE HCL 25 MG/ML IJ SOLN
INTRAMUSCULAR | Status: DC | PRN
  Administered 2019-10-16 (×2): 6.25 mg via INTRAVENOUS

## 2019-10-16 MED ORDER — PROMETHAZINE HCL 25 MG/ML IJ SOLN
INTRAMUSCULAR | Status: AC
  Filled 2019-10-16: qty 1

## 2019-10-16 MED ORDER — SCOPOLAMINE 1 MG/3DAYS TD PT72
MEDICATED_PATCH | TRANSDERMAL | Status: AC
  Filled 2019-10-16: qty 1

## 2019-10-16 MED ORDER — PROPOFOL 10 MG/ML IV BOLUS
INTRAVENOUS | Status: AC
  Filled 2019-10-16: qty 20

## 2019-10-16 MED ORDER — ONDANSETRON HCL 4 MG/2ML IJ SOLN
INTRAMUSCULAR | Status: AC
  Filled 2019-10-16: qty 2

## 2019-10-16 MED ORDER — MIDAZOLAM HCL 5 MG/5ML IJ SOLN
INTRAMUSCULAR | Status: DC | PRN
  Administered 2019-10-16: 2 mg via INTRAVENOUS

## 2019-10-16 MED ORDER — PROPOFOL 500 MG/50ML IV EMUL
INTRAVENOUS | Status: DC | PRN
  Administered 2019-10-16: 50 ug/kg/min via INTRAVENOUS

## 2019-10-16 MED ORDER — FENTANYL CITRATE (PF) 100 MCG/2ML IJ SOLN
INTRAMUSCULAR | Status: DC | PRN
  Administered 2019-10-16 (×2): 50 ug via INTRAVENOUS

## 2019-10-16 MED ORDER — KETAMINE HCL 10 MG/ML IJ SOLN
INTRAMUSCULAR | Status: DC | PRN
  Administered 2019-10-16: 10 mg via INTRAVENOUS

## 2019-10-16 MED ORDER — ACETAMINOPHEN-CODEINE #3 300-30 MG PO TABS: 1.0000 | ORAL_TABLET | Freq: Four times a day (QID) | ORAL | 0 refills | Status: DC | PRN

## 2019-10-16 MED ORDER — LIDOCAINE HCL (PF) 0.5 % IJ SOLN
INTRAMUSCULAR | Status: DC | PRN
  Administered 2019-10-16: 50 mg via INTRAVENOUS

## 2019-10-16 MED ORDER — MIDAZOLAM HCL 2 MG/2ML IJ SOLN
INTRAMUSCULAR | Status: AC
  Filled 2019-10-16: qty 2

## 2019-10-16 MED ORDER — PROPOFOL 10 MG/ML IV BOLUS
INTRAVENOUS | Status: DC | PRN
  Administered 2019-10-16 (×3): 20 mg via INTRAVENOUS

## 2019-10-16 MED ORDER — DEXTROSE 50 % IV SOLN
12.5000 g | Freq: Once | INTRAVENOUS | Status: AC
  Administered 2019-10-16: 12.5 g via INTRAVENOUS

## 2019-10-16 MED ORDER — LACTATED RINGERS IV SOLN: INTRAVENOUS | Status: DC | PRN

## 2019-10-16 MED ORDER — ACETAMINOPHEN-CODEINE #3 300-30 MG PO TABS
1.0000 | ORAL_TABLET | Freq: Once | ORAL | Status: AC
  Administered 2019-10-16: 1 via ORAL
  Filled 2019-10-16: qty 1

## 2019-10-16 MED ORDER — CHLORHEXIDINE GLUCONATE 4 % EX LIQD: 60.0000 mL | Freq: Once | CUTANEOUS | Status: DC

## 2019-10-16 SURGICAL SUPPLY — 41 items
APL PRP STRL LF DISP 70% ISPRP (MISCELLANEOUS) ×1
BANDAGE ESMARK 4X12 BL STRL LF (DISPOSABLE) ×1 IMPLANT
BLADE SURG 15 STRL LF DISP TIS (BLADE) ×1 IMPLANT
BLADE SURG 15 STRL SS (BLADE) ×2
BNDG CMPR 12X4 ELC STRL LF (DISPOSABLE) ×1
BNDG CMPR STD VLCR NS LF 5.8X3 (GAUZE/BANDAGES/DRESSINGS) ×1
BNDG COHESIVE 4X5 TAN STRL (GAUZE/BANDAGES/DRESSINGS) ×2 IMPLANT
BNDG ELASTIC 3X5.8 VLCR NS LF (GAUZE/BANDAGES/DRESSINGS) ×2 IMPLANT
BNDG ESMARK 4X12 BLUE STRL LF (DISPOSABLE) ×2
BNDG GAUZE ELAST 4 BULKY (GAUZE/BANDAGES/DRESSINGS) ×2 IMPLANT
CHLORAPREP W/TINT 26 (MISCELLANEOUS) ×2 IMPLANT
CLOTH BEACON ORANGE TIMEOUT ST (SAFETY) ×2 IMPLANT
COVER LIGHT HANDLE STERIS (MISCELLANEOUS) ×4 IMPLANT
COVER WAND RF STERILE (DRAPES) ×2 IMPLANT
CUFF TOURN SGL QUICK 18 (TOURNIQUET CUFF) ×2 IMPLANT
DECANTER SPIKE VIAL GLASS SM (MISCELLANEOUS) ×2 IMPLANT
DRAPE HALF SHEET 40X57 (DRAPES) ×2 IMPLANT
DRSG XEROFORM 1X8 (GAUZE/BANDAGES/DRESSINGS) ×2 IMPLANT
ELECT NEEDLE TIP 2.8 STRL (NEEDLE) ×2 IMPLANT
ELECT REM PT RETURN 9FT ADLT (ELECTROSURGICAL) ×2
ELECTRODE REM PT RTRN 9FT ADLT (ELECTROSURGICAL) ×1 IMPLANT
GAUZE SPONGE 4X4 12PLY STRL (GAUZE/BANDAGES/DRESSINGS) ×2 IMPLANT
GLOVE BIOGEL M 7.0 STRL (GLOVE) ×2 IMPLANT
GLOVE BIOGEL PI IND STRL 7.0 (GLOVE) ×1 IMPLANT
GLOVE BIOGEL PI INDICATOR 7.0 (GLOVE) ×1
GLOVE SKINSENSE NS SZ8.0 LF (GLOVE) ×1
GLOVE SKINSENSE STRL SZ8.0 LF (GLOVE) ×1 IMPLANT
GLOVE SS N UNI LF 8.5 STRL (GLOVE) ×2 IMPLANT
GOWN STRL REUS W/ TWL LRG LVL3 (GOWN DISPOSABLE) ×1 IMPLANT
GOWN STRL REUS W/TWL LRG LVL3 (GOWN DISPOSABLE) ×2
GOWN STRL REUS W/TWL XL LVL3 (GOWN DISPOSABLE) ×2 IMPLANT
KIT TURNOVER KIT A (KITS) ×2 IMPLANT
MANIFOLD NEPTUNE II (INSTRUMENTS) ×2 IMPLANT
NEEDLE HYPO 21X1.5 SAFETY (NEEDLE) ×2 IMPLANT
NS IRRIG 1000ML POUR BTL (IV SOLUTION) ×2 IMPLANT
PACK BASIC LIMB (CUSTOM PROCEDURE TRAY) ×2 IMPLANT
PAD ARMBOARD 7.5X6 YLW CONV (MISCELLANEOUS) ×2 IMPLANT
POSITIONER HAND ALUMI XLG (MISCELLANEOUS) ×2 IMPLANT
SET BASIN LINEN APH (SET/KITS/TRAYS/PACK) ×2 IMPLANT
SUT ETHILON 3 0 FSL (SUTURE) ×2 IMPLANT
SYR CONTROL 10ML LL (SYRINGE) ×2 IMPLANT

## 2019-10-16 NOTE — Telephone Encounter (Signed)
I have discussed with Dr Aline Brochure and he has had me advise patient ok to use 2 Tylenol 3 and make sure she is icing her hand and if wrap feels too tight she can unwrap ace bandage and rewrap it.  She has voiced understanding.

## 2019-10-16 NOTE — Anesthesia Preprocedure Evaluation (Signed)
Anesthesia Evaluation  Patient identified by MRN, date of birth, ID band Patient awake    Reviewed: Allergy & Precautions, NPO status , Patient's Chart, lab work & pertinent test results  History of Anesthesia Complications (+) PONV and history of anesthetic complications  Airway Mallampati: II  TM Distance: >3 FB Neck ROM: Full    Dental no notable dental hx. (+) Teeth Intact   Pulmonary asthma ,    Pulmonary exam normal breath sounds clear to auscultation       Cardiovascular Exercise Tolerance: Good hypertension, Pt. on medications negative cardio ROS Normal cardiovascular examI Rhythm:Regular Rate:Normal     Neuro/Psych  Headaches, PSYCHIATRIC DISORDERS Anxiety Depression Bipolar Disorder  Neuromuscular disease    GI/Hepatic negative GI ROS, Neg liver ROS,   Endo/Other  negative endocrine ROSdiabetes  Renal/GU Renal InsufficiencyRenal disease  negative genitourinary   Musculoskeletal negative musculoskeletal ROS (+)   Abdominal   Peds negative pediatric ROS (+)  Hematology negative hematology ROS (+)   Anesthesia Other Findings   Reproductive/Obstetrics negative OB ROS                             Anesthesia Physical Anesthesia Plan  ASA: II  Anesthesia Plan: Bier Block and MAC and Bier Block-LIDOCAINE ONLY   Post-op Pain Management:    Induction: Intravenous  PONV Risk Score and Plan: 3 and Propofol infusion, TIVA, Midazolam, Scopolamine patch - Pre-op and Dexamethasone  Airway Management Planned: Nasal Cannula and Simple Face Mask  Additional Equipment:   Intra-op Plan:   Post-operative Plan:   Informed Consent: I have reviewed the patients History and Physical, chart, labs and discussed the procedure including the risks, benefits and alternatives for the proposed anesthesia with the patient or authorized representative who has indicated his/her understanding and  acceptance.     Dental advisory given  Plan Discussed with: CRNA  Anesthesia Plan Comments: (Plan Full PPE use  Plan BIER block with MAC as tolerated  withGA vs.  GETA as needed d/w pt -WTP with same after Q&A  Pt reports h/o gastroparesis -denies GERD Sx today )        Anesthesia Quick Evaluation

## 2019-10-16 NOTE — H&P (Signed)
[name]   08/05/2019   HISTORY SECTION :       Chief Complaint  Patient presents with  . Wrist Pain      Lt wrist     HPI Dr. Luna Glasgow has referred the patient for possible carpal tunnel release   She is 34 years old she is coming to [name] with pain and paresthesias in the left upper extremity consistent with carpal tunnel syndrome she has had symptoms now for several years with numbness and tingling and nighttime symptoms associated with driving and nighttime pain l   Review of Systems  Constitutional: Negative for fever.  Eyes: Positive for blurred vision.  Respiratory: Negative for shortness of breath.   Cardiovascular: Negative for chest pain.  Musculoskeletal: Negative for joint pain.  Skin: Negative for rash.  Neurological: Positive for tingling and sensory change.       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         Past Surgical History:  Procedure Laterality Date  . ANAL RECTAL MANOMETRY N/A 03/25/2018    Procedure: ANO RECTAL MANOMETRY;  Surgeon: Doran Stabler, MD;  Location: [facility];  Service: Gastroenterology;  Laterality: N/A;  . CESAREAN SECTION        x 2  . CHOLECYSTECTOMY      . EYE SURGERY      . REFRACTIVE SURGERY Bilateral        Body mass index is 32.12 kg/m.          Allergies  Allergen Reactions  . Toradol [Ketorolac Tromethamine]        Cannot take d/t kidney issues    . Ceftin Rash        Current Outpatient Medications:  .  albuterol (PROAIR HFA) 108 (90 Base) MCG/ACT inhaler, INHALE 2 PUFFS BY MOUTH EVERY 6 HOURS AS NEEDED FOR SHORTNESS OF  BREATH, Disp: 18 g, Rfl: 3 .  albuterol (PROVENTIL) (2.5 MG/3ML) 0.083% nebulizer solution, Take 3 mLs (2.5 mg total) by nebulization every 6 (six) hours as needed for wheezing or shortness of breath., Disp: 150 mL, Rfl: 1 .  AMBULATORY NON FORMULARY MEDICATION, DOMPERIDONE 10 mg, twice a day. One with breakfast One with supper Dispense 60 tablets  2 refills, Disp: 60 tablet, Rfl: 2 .  amLODipine (NORVASC) 2.5 MG tablet, Take 2.5 mg by mouth daily., Disp: , Rfl:  .  busPIRone (BUSPAR) 15 MG tablet, TAKE 2 TABLETS (30 MG TOTAL) BY MOUTH 2 (TWO) TIMES DAILY., Disp: 120 tablet, Rfl: 1 .  carbamazepine (EQUETRO) 200 MG CP12 12 hr capsule, Take 2 capsules (400 mg total) by mouth 2 (two) times daily., Disp: 120 capsule, Rfl: 1 .  ciclopirox (PENLAC) 8 % solution, Apply one coat to each toenail daily. Remove weekly with polish remover., Disp: 6.6 mL, Rfl: 11 .  divalproex (DEPAKOTE ER) 500 MG 24 hr tablet, 1 nightly x5 days then 2 nightly for 5 days then 3 nightly, Disp: 90 tablet, Rfl: 0 .  enalapril (VASOTEC) 10 MG tablet, Take 10 mg by mouth daily., Disp: , Rfl:  .  Erenumab-aooe (AIMOVIG) 70 MG/ML SOAJ, Inject 70 mg into the skin every 30 (thirty) days., Disp: 1 pen, Rfl: 3 .  etonogestrel (NEXPLANON)  68 MG IMPL implant, 1 each by Subdermal route once., Disp: , Rfl:  .  fluticasone (FLONASE) 50 MCG/ACT nasal spray, Place 2 sprays into both nostrils daily., Disp: 16 g, Rfl: 2 .  furosemide (LASIX) 40 MG tablet, Take 40 mg by mouth. Every other day, Disp: , Rfl:  .  gabapentin (NEURONTIN) 300 MG capsule, Take 1 capsule (300 mg total) by mouth 3 (three) times daily. (Patient taking differently: Take 300 mg by mouth 3 (three) times daily as needed. ), Disp: 90 capsule, Rfl: 3 .  Galcanezumab-gnlm (EMGALITY [place]), Inject into the skin., Disp: , Rfl:  .  GLUCAGON EMERGENCY 1 MG injection, AS DIRECTED AS NEEDED FOR HYPOGLYCEMIA INJECTION 30 DAYS, Disp: , Rfl:  .  HYDROcodone-acetaminophen (NORCO) 5-325 MG  tablet, Take 1 tablet by mouth every 6 (six) hours as needed for moderate pain., Disp: 30 tablet, Rfl: 0 .  Insulin Human (INSULIN PUMP) SOLN, Inject 1 each into the skin 3 times daily with meals, bedtime and 2 AM. Novolog/36-38 units/day basal; bolus sliding scale/adjusted per sliding scale by patient, Disp: , Rfl:  .  lamoTRIgine (LAMICTAL) 150 MG tablet, Take 2 tablets (300 mg total) by mouth 2 (two) times daily., Disp: 120 tablet, Rfl: 1 .  LATUDA 120 MG TABS, TAKE 1 TABLET BY MOUTH EVERY DAY, Disp: 30 tablet, Rfl: 2 .  mometasone-formoterol (DULERA) 100-5 MCG/ACT AERO, Inhale 2 puffs into the lungs 2 (two) times daily., Disp: 1 Inhaler, Rfl: 5 .  montelukast (SINGULAIR) 10 MG tablet, Take 1 tablet (10 mg total) by mouth at bedtime., Disp: 90 tablet, Rfl: 3 .  NOVOLOG 100 UNIT/ML injection, Inject 36-150 Units into the skin. Via insulin pump per sliding scale, Disp: , Rfl:  .  Pramipexole Dihydrochloride 0.75 MG TB24, Take 1 tablet (0.75 mg total) by mouth every evening., Disp: 30 tablet, Rfl: 1 .  promethazine (PHENERGAN) 25 MG tablet, Take 0.5 tablets (12.5 mg total) by mouth every 8 (eight) hours as needed for nausea or vomiting., Disp: 30 tablet, Rfl: 1 .  SUMAtriptan (IMITREX) 100 MG tablet, Take 1 tablet (100 mg total) by mouth every 2 (two) hours as needed for migraine. May repeat in 2 hours if headache persists or recurs., Disp: 10 tablet, Rfl: 0 .  traZODone (DESYREL) 50 MG tablet, Take 1 tablet (50 mg total) by mouth at bedtime., Disp: 30 tablet, Rfl: 2 .  Ubrogepant (UBRELVY) 100 MG TABS, Take 100 mg by mouth., Disp: , Rfl:  .  Urea 40 % LOTN, Apply 1 application topically daily. Apply only to calluses, Disp: 325 mL, Rfl: 0 .  acetaminophen (TYLENOL) 500 MG tablet, Take 500 mg by mouth daily as needed for mild pain., Disp: , Rfl:  .  gabapentin (NEURONTIN) 400 MG capsule, Take 1 capsule (400 mg total) by mouth 3 (three) times daily., Disp: 42 capsule, Rfl: 1 .  topiramate (TOPAMAX) 25  MG tablet, Take 25 mg by mouth 2 (two) times daily. Taper dose until to 200mg  bid, Disp: , Rfl:      PHYSICAL EXAM SECTION: 1) BP (!) 144/81   Pulse (!) 101   Temp (!) 97.2 F (36.2 C)   Ht 5\' 6"  (1.676 m)   Wt 199 lb (90.3 kg)   BMI 32.12 kg/m   Body mass index is 32.12 kg/m. General appearance: Well-developed well-nourished no gross deformities  2) Cardiovascular normal pulse and perfusion in the UPPER  extremities normal color without edema  3) Neurologically deep tendon reflexes are equal  and normal, no sensation loss or deficits no pathologic reflexes   4) Psychological: Awake alert and oriented x3 mood and affect normal   5) Skin no lacerations or ulcerations no nodularity no palpable masses, no erythema or nodularity   6) Musculoskeletal:    The right hand is asymptomatic the alignment is normal there is no tenderness or swelling range of motion is normal no instability muscle tone is normal   The left hand is symptomatic alignment is normal skin looks good tenderness over the carpal tunnel pain with pressure over the carpal tunnel Phalen's positive Tinel's negative sensory loss in the index long ring finger thumb seems okay strength seems normal     MEDICAL DECISION SECTION:      Encounter Diagnosis  Name Primary?  . Carpal tunnel syndrome, left upper limb Yes      Imaging Nerve conduction study EMG was performed Assessment & Plan: Visit Diagnoses:  1. Paresthesia of skin     Plan: Impression: The above electrodiagnostic study is ABNORMAL and reveals evidence of a moderate to severe left median nerve entrapment at the wrist (carpal tunnel syndrome) affecting sensory and motor components.    There is no significant electrodiagnostic evidence of any other focal nerve entrapment, brachial plexopathy or cervical radiculopathy.      Plan:  (Rx., Inj., surg., Frx, MRI/CT, XR:2)   The procedure has been fully reviewed with the patient; The risks and benefits of  surgery have been discussed and explained and understood. Alternative treatment has also been reviewed, questions were encouraged and answered. The postoperative plan is also been reviewed.     She wishes to proceed with a left carpal tunnel release open technique  7:23 AM Arther Abbott, MD 10/16/2019

## 2019-10-16 NOTE — Interval H&P Note (Signed)
History and Physical Interval Note:  10/16/2019 7:24 AM  [name]  has presented today for surgery, with the diagnosis of left carpal tunnel syndrome.  The various methods of treatment have been discussed with the patient and family. After consideration of risks, benefits and other options for treatment, the patient has consented to  Procedure(s): LEFT CARPAL TUNNEL RELEASE (Left) as a surgical intervention.  The patient's history has been reviewed, patient examined, no change in status, stable for surgery.  I have reviewed the patient's chart and labs.  Questions were answered to the patient's satisfaction.     Arther Abbott

## 2019-10-16 NOTE — Transfer of Care (Signed)
Immediate Anesthesia Transfer of Care Note  Patient: [name]  Procedure(s) Performed: LEFT CARPAL TUNNEL RELEASE (Left )  Patient Location: PACU  Anesthesia Type:General  Level of Consciousness: awake  Airway & Oxygen Therapy: Patient Spontanous Breathing  Post-op Assessment: Report given to RN  Post vital signs: Reviewed  Last Vitals:  Vitals Value Taken Time  BP 155/77 10/16/19 0833  Temp    Pulse 87 10/16/19 0836  Resp 15 10/16/19 0836  SpO2 100 % 10/16/19 0836  Vitals shown include unvalidated device data.  Last Pain:  Vitals:   10/16/19 0718  PainSc: 7       Patients Stated Pain Goal: 3 ([id number])  Complications: No apparent anesthesia complications

## 2019-10-16 NOTE — Brief Op Note (Signed)
10/16/2019  8:27 AM  PATIENT:  [name]  34 y.o. female  PRE-OPERATIVE DIAGNOSIS:  left carpal tunnel syndrome  POST-OPERATIVE DIAGNOSIS:  left carpal tunnel syndrome  PROCEDURE:  Procedure(s): LEFT CARPAL TUNNEL RELEASE (Left) 64721  Surgical findings flattening and discoloration of the median nerve left wrist  No space-occupying lesions  SURGEON:  Surgeon(s) and Role:    Carole Civil, MD - Primary  Details of surgery  The patient was seen in the preop area appropriate preop evaluation was performed patient was cleared for surgery.  Chart update was completed surgical site confirmed and marked  Patient taken to the operating room for a Bier block which was successful.  Timeout was completed  The incision was made in line with the radial border of the ring finger and distal aspect of the carpal tunnel.  After dividing the subcutaneous tissue deep retractors were placed the distal aspect of the carpal tunnel was identified blunt instrument was passed beneath the transverse carpal ligament which was then sharply divided.  The carpal tunnel was inspected the nerve was evaluated.  It was discolored and flattened.  The wound was irrigated and closed with interrupted and running 3-0 nylon suture  Each side of the wound was injected with Sensorcaine 5 cc and then a sterile bandage was applied  The tourniquet was released the hand was viable with good color capillary refill  The patient was taken recovery room in stable condition  Bier block anesthetic  No assistance  Sponge and needle count correct  No blood loss  No drains  Local medications Sensorcaine plain 10 cc  Disposition to PACU  Stable  No assistants were needed  No specimens   TOURNIQUET:   Total Tourniquet Time Documented: Upper Arm (Left) - 35 minutes Total: Upper Arm (Left) - 35 minutes   DICTATION: .Viviann Spare Dictation  PLAN OF CARE: Discharge to home after PACU  PATIENT  DISPOSITION:  PACU - hemodynamically stable.   Delay start of Pharmacological VTE agent (>24hrs) due to surgical blood loss or risk of bleeding: not applicable

## 2019-10-16 NOTE — Telephone Encounter (Signed)
Patient called states hand hurting badly after surgery today and the tylenol #3 is not helping, she can not use Ibuprofen with it.   She does not feel like it is wrapped too tight, please advise.

## 2019-10-16 NOTE — Anesthesia Postprocedure Evaluation (Signed)
Anesthesia Post Note  Patient: [name]  Procedure(s) Performed: LEFT CARPAL TUNNEL RELEASE (Left )  Patient location during evaluation: PACU Anesthesia Type: MAC Level of consciousness: awake and alert and oriented Pain management: pain level controlled Vital Signs Assessment: post-procedure vital signs reviewed and stable Respiratory status: spontaneous breathing Cardiovascular status: blood pressure returned to baseline and stable Postop Assessment: adequate PO intake Anesthetic complications: no     Last Vitals:  Vitals:   10/16/19 0930 10/16/19 0954  BP: (!) 160/76 (!) 155/72  Pulse: 82 90  Resp: 13 18  Temp:  36.8 C  SpO2: 96% 98%    Last Pain:  Vitals:   10/16/19 0954  TempSrc: Oral  PainSc: 9                  Shakela Donati

## 2019-10-16 NOTE — Anesthesia Procedure Notes (Signed)
Anesthesia Regional Block: Bier block (IV Regional)   Pre-Anesthetic Checklist: ,, timeout performed, Correct Patient, Correct Site, Correct Laterality, Correct Procedure,, site marked, surgical consent,, at surgeon's request  Laterality: Left     Needles:  Injection technique: Single-shot  Needle Type: Other      Needle Gauge: 22     Additional Needles:   Procedures:,,,,, intact distal pulses, Esmarch exsanguination, single tourniquet utilized,   Nerve Stimulator or Paresthesia:   Additional Responses:  Pulse checked post tourniquet inflation. IV NSL discontinued post injection. Narrative:   Performed by: Personally       

## 2019-10-16 NOTE — Op Note (Signed)
10/16/2019  8:27 AM  PATIENT:  [name]  34 y.o. female  PRE-OPERATIVE DIAGNOSIS:  left carpal tunnel syndrome  POST-OPERATIVE DIAGNOSIS:  left carpal tunnel syndrome  PROCEDURE:  Procedure(s): LEFT CARPAL TUNNEL RELEASE (Left) 64721  Surgical findings flattening and discoloration of the median nerve left wrist  No space-occupying lesions  SURGEON:  Surgeon(s) and Role:    Carole Civil, MD - Primary  Details of surgery  The patient was seen in the preop area appropriate preop evaluation was performed patient was cleared for surgery.  Chart update was completed surgical site confirmed and marked  Patient taken to the operating room for a Bier block which was successful.  Timeout was completed  The incision was made in line with the radial border of the ring finger and distal aspect of the carpal tunnel.  After dividing the subcutaneous tissue deep retractors were placed the distal aspect of the carpal tunnel was identified blunt instrument was passed beneath the transverse carpal ligament which was then sharply divided.  The carpal tunnel was inspected the nerve was evaluated.  It was discolored and flattened.  The wound was irrigated and closed with interrupted and running 3-0 nylon suture  Each side of the wound was injected with Sensorcaine 5 cc and then a sterile bandage was applied  The tourniquet was released the hand was viable with good color capillary refill  The patient was taken recovery room in stable condition  Bier block anesthetic  No assistance  Sponge and needle count correct  No blood loss  No drains  Local medications Sensorcaine plain 10 cc  Disposition to PACU  Stable  No assistants were needed  No specimens   TOURNIQUET:   Total Tourniquet Time Documented: Upper Arm (Left) - 35 minutes Total: Upper Arm (Left) - 35 minutes   DICTATION: .Viviann Spare Dictation  PLAN OF CARE: Discharge to home after PACU  PATIENT  DISPOSITION:  PACU - hemodynamically stable.   Delay start of Pharmacological VTE agent (>24hrs) due to surgical blood loss or risk of bleeding: not applicable

## 2019-10-17 ENCOUNTER — Telehealth: Payer: Self-pay

## 2019-10-17 ENCOUNTER — Other Ambulatory Visit: Payer: Self-pay | Admitting: Family Medicine

## 2019-10-17 ENCOUNTER — Telehealth: Payer: Self-pay | Admitting: Family Medicine

## 2019-10-17 MED ORDER — OXYCODONE-ACETAMINOPHEN 10-325 MG PO TABS: 1.0000 | ORAL_TABLET | Freq: Three times a day (TID) | ORAL | 0 refills | Status: DC | PRN

## 2019-10-17 NOTE — Telephone Encounter (Signed)
Patient called wanting to ask if its normal that she still has numbness in her ring and pinky fingers since surgery yesterday?  Please call and advise.

## 2019-10-17 NOTE — Progress Notes (Signed)
Pt has called Dr.Hairston office concerning pain meds , she doesn't feel like the pain medication is relieving pain entirely. Nurse said they will give message to Dr. Braulio Conte and call her back.

## 2019-10-17 NOTE — Telephone Encounter (Signed)
Pt calls and states that she is having some numbness in her pinky and ring finger and the pain medication that was given is not helping with her pain. She just had surgery on carpel tunnel yesterday. She did call the surgeon's office but no one ever called her back and they do not have an after hours line (per pt). She also called the hospital and she was informed to call us. Per Dr. Dennard Schaumann numbness is coming from the ulnar nerve not from the surgery and it should gradually get better. Also tried to send in oxycodone but it will not let Dr. Dennard Schaumann send in electronically. Faxed Rx to [facility].

## 2019-10-17 NOTE — Telephone Encounter (Signed)
Pt calls and states that she is having some numbness in her pinky and ring finger and the pain medication that was given is not helping with her pain. She just had surgery on carpel tunnel yesterday. She did call the surgeon's office but no one ever called her back and they do not have an after hours line (per pt). She also called the hospital and she was informed to call us. Per Dr. Dennard Schaumann numbness is coming from the ulnar nerve not from the surgery and it should gradually get better. Also tried to send in oxycodone but it will not let Dr. Dennard Schaumann send in electronically. Faxed Rx to [facility].

## 2019-10-20 ENCOUNTER — Other Ambulatory Visit: Payer: Self-pay

## 2019-10-20 ENCOUNTER — Telehealth: Payer: Self-pay

## 2019-10-20 ENCOUNTER — Other Ambulatory Visit: Payer: Self-pay | Admitting: Family Medicine

## 2019-10-20 MED ORDER — OXYCODONE-ACETAMINOPHEN 10-325 MG PO TABS: 1.0000 | ORAL_TABLET | Freq: Three times a day (TID) | ORAL | 0 refills | Status: DC | PRN

## 2019-10-20 MED ORDER — OXYCODONE-ACETAMINOPHEN 10-325 MG PO TABS: 1.0000 | ORAL_TABLET | Freq: Three times a day (TID) | ORAL | 0 refills | Status: AC | PRN

## 2019-10-20 NOTE — Telephone Encounter (Signed)
Pt called and stated that [facility] called [facility] in [place] and they did not received the Rx for her oxycodone. Can you resend it?

## 2019-10-20 NOTE — Telephone Encounter (Signed)
Can you resend the pain med - I did fax on Friday but I guess they did not get it.

## 2019-10-31 ENCOUNTER — Ambulatory Visit (INDEPENDENT_AMBULATORY_CARE_PROVIDER_SITE_OTHER): Payer: Medicare Other | Admitting: Orthopedic Surgery

## 2019-10-31 ENCOUNTER — Other Ambulatory Visit: Payer: Self-pay

## 2019-10-31 VITALS — BP 123/76 | HR 82 | Temp 97.4°F | Ht 66.0 in | Wt 192.0 lb

## 2019-10-31 DIAGNOSIS — G5602 Carpal tunnel syndrome, left upper limb: Secondary | ICD-10-CM

## 2019-10-31 NOTE — Progress Notes (Signed)
POST OP NOTE   [name] is status post left carpal tunnel release   S:   Chief Complaint  Patient presents with  . Follow-up    Recheck on left CTR, DOS 10-16-19.   Doing well.   The patient complains of soreness at the wrist but she does indicate some return of sensation in the fingertips Current pain medication: None  The incision is clean dry and intact with no drainage The dressing was changed.  Patient to return for suture removal ~ IN 5  days   Encounter Diagnosis  Name Primary?  . Carpal tunnel syndrome, left upper limb Yes

## 2019-10-31 NOTE — Patient Instructions (Signed)
Ok to shower if wound is covered  Move the fingers and open and close the hand   Wednesday come back to remove the sutures

## 2019-11-03 DIAGNOSIS — G43701 Chronic migraine without aura, not intractable, with status migrainosus: Secondary | ICD-10-CM | POA: Diagnosis not present

## 2019-11-03 DIAGNOSIS — I10 Essential (primary) hypertension: Secondary | ICD-10-CM | POA: Diagnosis not present

## 2019-11-03 DIAGNOSIS — E1021 Type 1 diabetes mellitus with diabetic nephropathy: Secondary | ICD-10-CM | POA: Diagnosis not present

## 2019-11-03 DIAGNOSIS — F319 Bipolar disorder, unspecified: Secondary | ICD-10-CM | POA: Diagnosis not present

## 2019-11-04 ENCOUNTER — Other Ambulatory Visit: Payer: Self-pay

## 2019-11-04 ENCOUNTER — Ambulatory Visit (INDEPENDENT_AMBULATORY_CARE_PROVIDER_SITE_OTHER): Payer: Medicare Other | Admitting: Orthopaedic Surgery

## 2019-11-04 ENCOUNTER — Encounter: Payer: Self-pay | Admitting: Orthopaedic Surgery

## 2019-11-04 VITALS — BP 133/54 | HR 89 | Temp 97.4°F | Ht 66.0 in | Wt 192.0 lb

## 2019-11-04 DIAGNOSIS — Z9889 Other specified postprocedural states: Secondary | ICD-10-CM

## 2019-11-04 DIAGNOSIS — M7062 Trochanteric bursitis, left hip: Secondary | ICD-10-CM

## 2019-11-04 HISTORY — DX: Other specified postprocedural states: Z98.890

## 2019-11-04 NOTE — Progress Notes (Signed)
My hip is so much better  Her left hip bursa area is much improved.  She has little pain now after the injection.  She is walking well.  NV intact.  ROM full.  Encounter Diagnosis  Name Primary?  . Trochanteric bursitis, left hip Yes   I will see as needed.  Call if any problem.  Precautions discussed.   Electronically Signed Sanjuana Kava, MD 1/26/20212:38 PM

## 2019-11-04 NOTE — Patient Instructions (Signed)
Use Aspercreme, Biofreeze or Voltaren gel over the counter 2-3 times daily make sure you rub it in well each time you use it.

## 2019-11-05 ENCOUNTER — Encounter: Payer: Self-pay | Admitting: Orthopedic Surgery

## 2019-11-05 ENCOUNTER — Ambulatory Visit (INDEPENDENT_AMBULATORY_CARE_PROVIDER_SITE_OTHER): Payer: Medicare Other | Admitting: Orthopedic Surgery

## 2019-11-05 DIAGNOSIS — Z9889 Other specified postprocedural states: Secondary | ICD-10-CM

## 2019-11-05 NOTE — Patient Instructions (Signed)
Brace right hand at night   Vit B6 100 mg twice a day

## 2019-11-05 NOTE — Progress Notes (Signed)
POST OP NOTE   [name] is status post left carpal tunnel release   S:   Chief Complaint  Patient presents with  . Routine Post Op    10/16/19 left carpal tunnel release   . Hand Pain    new complaint of right hand painful    Doing well.   The patient complains of soreness in the thenar eminence and retinaculum  Current pain medication: Tylenol with codeine  All sutures were removed skin seal applied patient will continue with treatment on the left side with bracing gabapentin and vitamin B6  Follow-up 3 weeks

## 2019-11-06 DIAGNOSIS — E109 Type 1 diabetes mellitus without complications: Secondary | ICD-10-CM | POA: Diagnosis not present

## 2019-11-13 ENCOUNTER — Ambulatory Visit (INDEPENDENT_AMBULATORY_CARE_PROVIDER_SITE_OTHER): Payer: Medicare Other | Admitting: Family Medicine

## 2019-11-13 ENCOUNTER — Other Ambulatory Visit (INDEPENDENT_AMBULATORY_CARE_PROVIDER_SITE_OTHER): Payer: Self-pay | Admitting: Family Medicine

## 2019-11-13 ENCOUNTER — Encounter (INDEPENDENT_AMBULATORY_CARE_PROVIDER_SITE_OTHER): Payer: Self-pay | Admitting: Family Medicine

## 2019-11-13 ENCOUNTER — Other Ambulatory Visit: Payer: Self-pay

## 2019-11-13 VITALS — BP 115/74 | HR 83 | Temp 98.5°F | Ht 66.0 in | Wt 199.0 lb

## 2019-11-13 DIAGNOSIS — F319 Bipolar disorder, unspecified: Secondary | ICD-10-CM

## 2019-11-13 DIAGNOSIS — K3184 Gastroparesis: Secondary | ICD-10-CM | POA: Diagnosis not present

## 2019-11-13 DIAGNOSIS — N189 Chronic kidney disease, unspecified: Secondary | ICD-10-CM | POA: Diagnosis not present

## 2019-11-13 DIAGNOSIS — E669 Obesity, unspecified: Secondary | ICD-10-CM | POA: Diagnosis not present

## 2019-11-13 DIAGNOSIS — Z6832 Body mass index (BMI) 32.0-32.9, adult: Secondary | ICD-10-CM

## 2019-11-13 DIAGNOSIS — E1040 Type 1 diabetes mellitus with diabetic neuropathy, unspecified: Secondary | ICD-10-CM

## 2019-11-13 DIAGNOSIS — Z1331 Encounter for screening for depression: Secondary | ICD-10-CM | POA: Diagnosis not present

## 2019-11-13 DIAGNOSIS — R0602 Shortness of breath: Secondary | ICD-10-CM | POA: Diagnosis not present

## 2019-11-13 DIAGNOSIS — E559 Vitamin D deficiency, unspecified: Secondary | ICD-10-CM | POA: Diagnosis not present

## 2019-11-13 DIAGNOSIS — Z0289 Encounter for other administrative examinations: Secondary | ICD-10-CM

## 2019-11-13 DIAGNOSIS — R5383 Other fatigue: Secondary | ICD-10-CM | POA: Diagnosis not present

## 2019-11-13 DIAGNOSIS — I10 Essential (primary) hypertension: Secondary | ICD-10-CM

## 2019-11-13 NOTE — Progress Notes (Signed)
Dear Tye Savoy, NP,   Thank you for referring [name] to our clinic. The following note includes my evaluation and treatment recommendations.  Chief Complaint:   OBESITY [name] (MR# [id number]) is a 34 y.o. female who presents for evaluation and treatment of obesity and related comorbidities. Current BMI is Body mass index is 32.12 kg/m. [name] has been struggling with her weight for many years and has been unsuccessful in either losing weight, maintaining weight loss, or reaching her healthy weight goal.  [name] is currently in the action stage of change and ready to dedicate time achieving and maintaining a healthier weight. [name] is interested in becoming our patient and working on intensive lifestyle modifications including (but not limited to) diet and exercise for weight loss.  [name]'s habits were reviewed today and are as follows: Her family eats meals together, she thinks her family will eat healthier with her, her desired weight loss is 37 pounds, she has been heavy most of her life, she started gaining weight after childbirth, her heaviest weight ever was 225 pounds, she craves sweets, she snacks frequently in the evenings, she wakes up frequently in the middle of the night to eat, she skips meals frequently, she frequently makes poor food choices and she struggles with emotional eating.  Depression Screen [name]'s Food and Mood (modified PHQ-9) score was 19.  Depression screen PHQ 2/9 11/13/2019  Decreased Interest 2  Down, Depressed, Hopeless 3  PHQ - 2 Score 5  Altered sleeping 1  Tired, decreased energy 2  Change in appetite 3  Feeling bad or failure about yourself  3  Trouble concentrating 3  Moving slowly or fidgety/restless 1  Suicidal thoughts 1  PHQ-9 Score 19  Difficult doing work/chores Somewhat difficult  Some recent data might be hidden   Subjective:   1. Other fatigue [name] admits to daytime somnolence and reports  waking up still tired. Patent has a history of symptoms of daytime fatigue, morning fatigue and morning headache. [name] generally gets 8 hours of sleep per night, and states that she has generally restful sleep. Snoring is present. Apneic episodes are not present. Epworth Sleepiness Score is 7.  2. SOB (shortness of breath) on exertion [name] notes increasing shortness of breath with exercising and seems to be worsening over time with weight gain. She notes getting out of breath sooner with activity than she used to. This has gotten worse recently. [name] denies shortness of breath at rest or orthopnea.  3. Type 1 diabetes mellitus with diabetic neuropathy, unspecified ([name]) Medications reviewed. Home glucose monitoring: is performed regularly.  [name] has an insulin pump.  Her A1c has been as high as 13.3 in the past 5 years.  She was diagnosed with diabetes at age 14.  Lab Results  Component Value Date   HGBA1C 8.0 (H) 09/09/2019   HGBA1C 9.0 04/05/2017   HGBA1C 11.2 (H) 11/24/2015   Lab Results  Component Value Date   LDLCALC 68 04/22/2014   CREATININE 1.69 (H) 10/14/2019   4. Essential hypertension Review: taking medications as instructed, no medication side effects noted, no chest pain on exertion, no dyspnea on exertion, no swelling of ankles.  She is currently taking enalapril for blood pressure control.  BP Readings from Last 3 Encounters:  11/13/19 115/74  11/04/19 (!) 133/54  10/31/19 123/76   5. Chronic kidney disease GFR is 39-44.    Lab Results  Component Value Date   CREATININE 1.69 (H) 10/14/2019  CREATININE 1.54 (H) 09/09/2019   CREATININE 1.56 (H) 07/08/2019   Lab Results  Component Value Date   CREATININE 1.69 (H) 10/14/2019   BUN 43 (H) 10/14/2019   NA 136 10/14/2019   K 5.0 10/14/2019   CL 102 10/14/2019   CO2 25 10/14/2019   6. Gastroparesis [name] has the diagnosis of gastroparesis.  7. Vitamin D deficiency [name] is not currently taking  vit D.  8. Bipolar affective disorder, remission status unspecified ([place]) [name] is taking Latuda, Buspar, and Lamictal.  This is currently controlled.  9. Depression screening [name] was screened for depression as part of her new patient workup.  Assessment/Plan:   1. Other fatigue Jamela does feel that her weight is causing her energy to be lower than it should be. Fatigue may be related to obesity, depression or many other causes. Labs will be ordered, and in the meanwhile, [name] will focus on self care including making healthy food choices, increasing physical activity and focusing on stress reduction.  Orders - EKG 12-Lead - TSH - T3 - T4, free - Anemia panel  2. SOB (shortness of breath) on exertion Tyera does feel that she gets out of breath more easily that she used to when she exercises. Anhthu's shortness of breath appears to be obesity related and exercise induced. She has agreed to work on weight loss and gradually increase exercise to treat her exercise induced shortness of breath. Will continue to monitor closely.  Orders - CBC with Differential/Platelet  3. Type 1 diabetes mellitus with diabetic neuropathy, unspecified ([facility]) Good blood sugar control is important to decrease the likelihood of diabetic complications such as nephropathy, neuropathy, limb loss, blindness, coronary artery disease, and death. Intensive lifestyle modification including diet, exercise and weight loss are the first line of treatment for diabetes.  - Comprehensive metabolic panel  4. Essential hypertension [name] is working on healthy weight loss and exercise to improve blood pressure control. We will watch for signs of hypotension as she continues her lifestyle modifications.  Orders - CBC with Differential/Platelet  5. Chronic kidney disease, unspecified CKD stage Lab results and trends reviewed. We discussed several lifestyle modifications today and she will continue to work on  diet, exercise and weight loss efforts. Avoid nephrotoxic medications. Orders and follow up as documented in patient record.   Counseling . Chronic kidney disease (CKD) happens when the kidneys are damaged over a long period of time. . Most of the time, this condition does not go away, but it can usually be controlled. Steps must be taken to slow down the kidney damage or to stop it from getting worse. . Intensive lifestyle modifications are the first line treatment for this issue.  Marland Kitchen Avoid buying foods that are: processed, frozen, or prepackaged to avoid excess salt.  Orders - Comprehensive metabolic panel  6. Gastroparesis Will monitor.  7. Vitamin D deficiency Low Vitamin D level contributes to fatigue and are associated with obesity, breast, and colon cancer.   Orders - VITAMIN D 25 Hydroxy (Vit-D Deficiency, Fractures)  8. Bipolar affective disorder, remission status unspecified ([place]) Will follow.  9. Depression screening [place] had a positive depression screening. Depression is commonly associated with obesity and often results in emotional eating behaviors. We will monitor this closely and work on CBT to help improve the non-hunger eating patterns. Referral to Psychology may be required if no improvement is seen as she continues in our clinic.  10. Class 1 obesity with serious comorbidity and body mass index (BMI)  of 32.0 to 32.9 in adult, unspecified obesity type [name] is currently in the action stage of change and her goal is to continue with weight loss efforts. I recommend [name] begin the structured treatment plan as follows:  She has agreed to the Category 3 Plan.  Exercise goals: For substantial health benefits, adults should do at least 150 minutes (2 hours and 30 minutes) a week of moderate-intensity, or 75 minutes (1 hour and 15 minutes) a week of vigorous-intensity aerobic physical activity, or an equivalent combination of moderate- and vigorous-intensity aerobic  activity. Aerobic activity should be performed in episodes of at least 10 minutes, and preferably, it should be spread throughout the week. Adults should also include muscle-strengthening activities that involve all major muscle groups on 2 or more days a week.   Behavioral modification strategies: increasing lean protein intake, decreasing simple carbohydrates, increasing vegetables and increasing water intake.  She was informed of the importance of frequent follow-up visits to maximize her success with intensive lifestyle modifications for her multiple health conditions. She was informed we would discuss her lab results at her next visit unless there is a critical issue that needs to be addressed sooner. [name] agreed to keep her next visit at the agreed upon time to discuss these results.  Objective:   Blood pressure 115/74, pulse 83, temperature 98.5 F (36.9 C), temperature source Oral, height 5\' 6"  (1.676 m), weight 199 lb (90.3 kg), last menstrual period 10/22/2019, SpO2 100 %. Body mass index is 32.12 kg/m.  EKG: Normal sinus rhythm, rate 80 bpm.  Indirect Calorimeter completed today shows a VO2 of 341 and a REE of 2376.  Her calculated basal metabolic rate is [id number] thus her basal metabolic rate is better than expected.  General: Cooperative, alert, well developed, in no acute distress. HEENT: Conjunctivae and lids unremarkable. Cardiovascular: Regular rhythm.  Lungs: Normal work of breathing. Neurologic: No focal deficits.   Lab Results  Component Value Date   CREATININE 1.69 (H) 10/14/2019   BUN 43 (H) 10/14/2019   NA 136 10/14/2019   K 5.0 10/14/2019   CL 102 10/14/2019   CO2 25 10/14/2019   Lab Results  Component Value Date   ALT 16 07/08/2019   AST 17 07/08/2019   ALKPHOS 121 07/08/2019   BILITOT 0.6 07/08/2019   Lab Results  Component Value Date   HGBA1C 8.0 (H) 09/09/2019   HGBA1C 9.0 04/05/2017   HGBA1C 11.2 (H) 11/24/2015   HGBA1C 13.3 (H) 04/22/2014    HGBA1C 10.7 (H) 10/11/2013   Lab Results  Component Value Date   TSH 1.27 11/24/2015   Lab Results  Component Value Date   CHOL 149 04/22/2014   HDL 71 04/22/2014   LDLCALC 68 04/22/2014   TRIG 49 04/22/2014   CHOLHDL 2.1 04/22/2014   Lab Results  Component Value Date   WBC 7.9 10/14/2019   HGB 13.4 10/14/2019   HCT 41.9 10/14/2019   MCV 92.7 10/14/2019   PLT 315 10/14/2019   Obesity Behavioral Intervention:   Approximately 15 minutes were spent on the discussion below.  ASK: We discussed the diagnosis of obesity with [name] today and [name] agreed to give [name] permission to discuss obesity behavioral modification therapy today.  ASSESS: [name] has the diagnosis of obesity and her BMI today is 32.2. [name] is in the action stage of change.   ADVISE: [name] was educated on the multiple health risks of obesity as well as the benefit of weight loss to improve her health.  She was advised of the need for long term treatment and the importance of lifestyle modifications to improve her current health and to decrease her risk of future health problems.  AGREE: Multiple dietary modification options and treatment options were discussed and Makalynn agreed to follow the recommendations documented in the above note.  ARRANGE: Nicola was educated on the importance of frequent visits to treat obesity as outlined per CMS and USPSTF guidelines and agreed to schedule her next follow up appointment today.  Attestation Statements:   This is the patient's first visit at [facility]. The patient's NEW PATIENT PACKET was reviewed at length. Included in the packet: current and past health history, medications, allergies, ROS, gynecologic history (women only), surgical history, family history, social history, weight history, weight loss surgery history (for those that have had weight loss surgery), nutritional evaluation, mood and food questionnaire, PHQ9, Epworth questionnaire,  sleep habits questionnaire, patient life and health improvement goals questionnaire. These will all be scanned into the patient's chart under media.   During the visit, I independently reviewed the patient's EKG, bioimpedance scale results, and indirect calorimeter results. I used this information to tailor a meal plan for the patient that will help her to lose weight and will improve her obesity-related conditions going forward. I performed a medically necessary appropriate examination and/or evaluation. I discussed the assessment and treatment plan with the patient. The patient was provided an opportunity to ask questions and all were answered. The patient agreed with the plan and demonstrated an understanding of the instructions. Labs were ordered at this visit and will be reviewed at the next visit unless more critical results need to be addressed immediately. Clinical information was updated and documented in the EMR.   Time spent on visit including pre-visit chart review and post-visit care was 60.  I, Water quality scientist, CMA, am acting as Location manager for [employer], DO.  I have reviewed the above documentation for accuracy and completeness, and I agree with the above. Briscoe Deutscher, DO

## 2019-11-14 LAB — COMPREHENSIVE METABOLIC PANEL
ALT: 12 IU/L (ref 0–32)
AST: 15 IU/L (ref 0–40)
Albumin/Globulin Ratio: 1.8 (ref 1.2–2.2)
Albumin: 4 g/dL (ref 3.8–4.8)
Alkaline Phosphatase: 118 IU/L — ABNORMAL HIGH (ref 39–117)
BUN/Creatinine Ratio: 25 — ABNORMAL HIGH (ref 9–23)
BUN: 40 mg/dL — ABNORMAL HIGH (ref 6–20)
Bilirubin Total: <0.2 mg/dL (ref 0.0–1.2)
CO2: 21 mmol/L (ref 20–29)
Calcium: 8.9 mg/dL (ref 8.7–10.2)
Chloride: 102 mmol/L (ref 96–106)
Creatinine, Ser: 1.62 mg/dL — ABNORMAL HIGH (ref 0.57–1.00)
GFR calc Af Amer: 47 mL/min/{1.73_m2} — ABNORMAL LOW (ref >59)
GFR calc non Af Amer: 41 mL/min/{1.73_m2} — ABNORMAL LOW (ref >59)
Globulin, Total: 2.2 g/dL (ref 1.5–4.5)
Glucose: 213 mg/dL — ABNORMAL HIGH (ref 65–99)
Potassium: 5.3 mmol/L — ABNORMAL HIGH (ref 3.5–5.2)
Sodium: 136 mmol/L (ref 134–144)
Total Protein: 6.2 g/dL (ref 6.0–8.5)

## 2019-11-14 LAB — T4, FREE: Free T4: 0.9 ng/dL (ref 0.82–1.77)

## 2019-11-14 LAB — ANEMIA PANEL
Ferritin: 77 ng/mL (ref 15–150)
Folate, Hemolysate: 457 ng/mL
Folate, RBC: 1169 ng/mL (ref >498)
Hematocrit: 39.1 % (ref 34.0–46.6)
Iron Saturation: 15 % (ref 15–55)
Iron: 41 ug/dL (ref 27–159)
Retic Ct Pct: 1.4 % (ref 0.6–2.6)
Total Iron Binding Capacity: 267 ug/dL (ref 250–450)
UIBC: 226 ug/dL (ref 131–425)
Vitamin B-12: 603 pg/mL (ref 232–1245)

## 2019-11-14 LAB — VITAMIN D 25 HYDROXY (VIT D DEFICIENCY, FRACTURES): Vit D, 25-Hydroxy: 24.5 ng/mL — ABNORMAL LOW (ref 30.0–100.0)

## 2019-11-14 LAB — CBC WITH DIFFERENTIAL/PLATELET
Basophils Absolute: 0.1 10*3/uL (ref 0.0–0.2)
Basos: 1 %
EOS (ABSOLUTE): 0.4 10*3/uL (ref 0.0–0.4)
Eos: 6 %
Hematocrit: 35.7 % (ref 34.0–46.6)
Hemoglobin: 11.5 g/dL (ref 11.1–15.9)
Immature Grans (Abs): 0 10*3/uL (ref 0.0–0.1)
Immature Granulocytes: 0 %
Lymphocytes Absolute: 1.7 10*3/uL (ref 0.7–3.1)
Lymphs: 22 %
MCH: 29.6 pg (ref 26.6–33.0)
MCHC: 32.2 g/dL (ref 31.5–35.7)
MCV: 92 fL (ref 79–97)
Monocytes Absolute: 0.5 10*3/uL (ref 0.1–0.9)
Monocytes: 7 %
Neutrophils Absolute: 5 10*3/uL (ref 1.4–7.0)
Neutrophils: 64 %
Platelets: 280 10*3/uL (ref 150–450)
RBC: 3.89 x10E6/uL (ref 3.77–5.28)
RDW: 11.7 % (ref 11.7–15.4)
WBC: 7.8 10*3/uL (ref 3.4–10.8)

## 2019-11-14 LAB — T3: T3, Total: 74 ng/dL (ref 71–180)

## 2019-11-14 LAB — TSH: TSH: 0.859 u[IU]/mL (ref 0.450–4.500)

## 2019-11-17 ENCOUNTER — Telehealth: Payer: Self-pay | Admitting: Psychiatry

## 2019-11-17 NOTE — Telephone Encounter (Signed)
[name] called and LM that she is having a lot of issues.  Would like to discuss what is going on and get help. She does not have an appt scheduled.  Please call.

## 2019-11-18 ENCOUNTER — Other Ambulatory Visit: Payer: Self-pay | Admitting: Psychiatry

## 2019-11-18 DIAGNOSIS — Z3169 Encounter for other general counseling and advice on procreation: Secondary | ICD-10-CM | POA: Diagnosis not present

## 2019-11-18 NOTE — Telephone Encounter (Signed)
ID# [id number], Kara DiesIZ:[id number], PCN: [id number]  [phone]) [phone] [employer] Prior authorization dept.

## 2019-11-18 NOTE — Telephone Encounter (Signed)
Prior authorization submitted for EQUETRO 200 mg 2 capsules every am, 3 capsules at hs, #150 through [place] Part D. Pending response at this time.

## 2019-11-18 NOTE — Telephone Encounter (Signed)
She can increase her gabapentin to 4 of the 400  mg capsules daily or we can change the prescription to 600 mg tablets and she can take 3 daily of those to reduce anxiety.  Precaution about tiredness as with any antianxiety medicine.  Of note as well the reason for the prior authorization for pramipexole is because we are asking for pramipexole ER.  The reason for that request is because she has nausea with the immediate release form.  Otherwise she finds it helpful.

## 2019-11-18 NOTE — Telephone Encounter (Signed)
Left patient detailed message with information and informed her I would call back tomorrow morning to make sure she understood.  Also received a prior approval on her Equetro effective 10/10/2019-11/17/2020  I resubmitted her Pramipexole ER 0.75 mg to make sure they had the ER and not just the IR, pending a response at this time.

## 2019-11-18 NOTE — Telephone Encounter (Signed)
Contacted [employer] Medicare Part D, they have patient listed as [name] but her pharmacy has [name]. That may be the issues the pharmacy is having with her refill on Pramipexole 0.75 mg because Medicare said its covered and doesn't need a PA. I informed patient and advised her to update her pharmacy, if there are further issues to call me back.

## 2019-11-19 ENCOUNTER — Other Ambulatory Visit: Payer: Self-pay

## 2019-11-19 MED ORDER — GABAPENTIN 600 MG PO TABS: 600.0000 mg | ORAL_TABLET | Freq: Three times a day (TID) | ORAL | 2 refills | Status: DC

## 2019-11-19 NOTE — Telephone Encounter (Signed)
Called and spoke with patient this morning, she did receive my message about increase on Gabapentin, she said she is feeling tired with it but will continue and see how she does on it. Rx for Gabapentin 600 mg tid #90 submitted to [facility].  Also informed her that the Pramipexole ER 0.75 mg was approved through [facility] [employer] Medicare was approved effective 10/10/2019-11/18/2020.   Transferred her up to front office staff to schedule a follow up in 3-4 weeks.

## 2019-11-20 ENCOUNTER — Encounter (INDEPENDENT_AMBULATORY_CARE_PROVIDER_SITE_OTHER): Payer: Self-pay | Admitting: Family Medicine

## 2019-11-24 NOTE — Telephone Encounter (Signed)
Please advise 

## 2019-11-25 ENCOUNTER — Other Ambulatory Visit: Payer: Self-pay

## 2019-11-25 ENCOUNTER — Ambulatory Visit (INDEPENDENT_AMBULATORY_CARE_PROVIDER_SITE_OTHER): Payer: Medicare Other | Admitting: Orthopaedic Surgery

## 2019-11-25 ENCOUNTER — Encounter: Payer: Self-pay | Admitting: Orthopaedic Surgery

## 2019-11-25 DIAGNOSIS — M7061 Trochanteric bursitis, right hip: Secondary | ICD-10-CM | POA: Diagnosis not present

## 2019-11-25 DIAGNOSIS — M7062 Trochanteric bursitis, left hip: Secondary | ICD-10-CM | POA: Diagnosis not present

## 2019-11-25 NOTE — Progress Notes (Signed)
PROCEDURE NOTE:  The patient request injection, verbal consent was obtained.  The right trochanteric area of the hip was prepped appropriately after time out was performed.   Sterile technique was observed and injection of 1 cc of Depo-Medrol 40 mg with several cc's of plain xylocaine. Anesthesia was provided by ethyl chloride and a 20-gauge needle was used to inject the hip area. The injection was tolerated well.  A band aid dressing was applied.  The patient was advised to apply ice later today and tomorrow to the injection sight as needed.  PROCEDURE NOTE:  The patient request injection, verbal consent was obtained.  The left trochanteric area of the hip was prepped appropriately after time out was performed.   Sterile technique was observed and injection of 1 cc of Depo-Medrol 40 mg with several cc's of plain xylocaine. Anesthesia was provided by ethyl chloride and a 20-gauge needle was used to inject the hip area. The injection was tolerated well.  A band aid dressing was applied.  The patient was advised to apply ice later today and tomorrow to the injection sight as needed.  Return in one month.  Electronically Signed Sanjuana Kava, MD 2/16/202110:47 AM

## 2019-11-26 ENCOUNTER — Ambulatory Visit: Payer: Medicare Other | Admitting: Orthopedic Surgery

## 2019-11-26 ENCOUNTER — Telehealth: Payer: Self-pay | Admitting: Orthopaedic Surgery

## 2019-11-26 MED ORDER — HYDROCODONE-ACETAMINOPHEN 5-325 MG PO TABS: ORAL_TABLET | ORAL | 0 refills | Status: DC

## 2019-11-26 NOTE — Telephone Encounter (Signed)
I spoke back to [place] and told her that you said this wasn't uncommon for the hip to hurt.   She does not have anything for pain and I told her that you did offer to give her something.    She said this would be fine and she uses [facility] in [place]

## 2019-11-26 NOTE — Telephone Encounter (Signed)
[name] called and said that she got bilateral hip injections yesterday.  She said  that this morning that her left hip is hurting her so bad that she can't put weight on it.  The right hip does not hurt at all.  She said she has iced it and has taken a hot bath.  Neither of these has decreased her pain in the left hip.  She said she doesn't know what else to do.  She wants to know if you have any suggestions?  Please advise  Thanks

## 2019-11-27 ENCOUNTER — Ambulatory Visit (INDEPENDENT_AMBULATORY_CARE_PROVIDER_SITE_OTHER): Payer: Medicare Other | Admitting: Family Medicine

## 2019-11-28 ENCOUNTER — Ambulatory Visit: Payer: Medicare Other | Admitting: Orthopedic Surgery

## 2019-12-01 ENCOUNTER — Ambulatory Visit (INDEPENDENT_AMBULATORY_CARE_PROVIDER_SITE_OTHER): Payer: Medicare Other | Admitting: Orthopedic Surgery

## 2019-12-01 ENCOUNTER — Encounter: Payer: Self-pay | Admitting: Orthopedic Surgery

## 2019-12-01 ENCOUNTER — Other Ambulatory Visit: Payer: Self-pay

## 2019-12-01 DIAGNOSIS — Z9889 Other specified postprocedural states: Secondary | ICD-10-CM

## 2019-12-01 NOTE — Progress Notes (Signed)
Chief Complaint  Patient presents with  . Post-op Follow-up    left carpal tunnel pain at incision and had some numbness this weekend 10/16/19 left carpal tunnel release    Status post left carpal tunnel release on January 7 complains of some numbness in her left thumb the remaining portion of the hand are normal in terms of sensation  She has had no night pain  Her right carpal tunnel is symptomatic but she is on gabapentin with a recent increase to 600 mg and she is on B6 and has a splint.  She did not have a nerve conduction study on the right side.  She is not interested in surgery right now prefers to treat this nonoperatively  She will give [name] a call if she gets worse or changes her mind regarding surgery  Encounter Diagnosis  Name Primary?  . S/P carpal tunnel release left 10/16/19 Yes

## 2019-12-01 NOTE — Patient Instructions (Signed)
Continue gabapentin and splinting and B6 for the right side

## 2019-12-04 ENCOUNTER — Other Ambulatory Visit: Payer: Self-pay

## 2019-12-04 ENCOUNTER — Ambulatory Visit (INDEPENDENT_AMBULATORY_CARE_PROVIDER_SITE_OTHER): Payer: Medicare Other | Admitting: Family Medicine

## 2019-12-04 ENCOUNTER — Encounter (INDEPENDENT_AMBULATORY_CARE_PROVIDER_SITE_OTHER): Payer: Self-pay | Admitting: Family Medicine

## 2019-12-04 VITALS — BP 135/88 | HR 107 | Temp 98.1°F | Ht 66.0 in | Wt 189.0 lb

## 2019-12-04 DIAGNOSIS — E559 Vitamin D deficiency, unspecified: Secondary | ICD-10-CM

## 2019-12-04 DIAGNOSIS — E109 Type 1 diabetes mellitus without complications: Secondary | ICD-10-CM | POA: Diagnosis not present

## 2019-12-04 DIAGNOSIS — R748 Abnormal levels of other serum enzymes: Secondary | ICD-10-CM | POA: Diagnosis not present

## 2019-12-04 DIAGNOSIS — E669 Obesity, unspecified: Secondary | ICD-10-CM | POA: Diagnosis not present

## 2019-12-04 DIAGNOSIS — N189 Chronic kidney disease, unspecified: Secondary | ICD-10-CM

## 2019-12-04 DIAGNOSIS — Z6830 Body mass index (BMI) 30.0-30.9, adult: Secondary | ICD-10-CM

## 2019-12-04 DIAGNOSIS — F319 Bipolar disorder, unspecified: Secondary | ICD-10-CM

## 2019-12-04 DIAGNOSIS — K3184 Gastroparesis: Secondary | ICD-10-CM

## 2019-12-04 DIAGNOSIS — D638 Anemia in other chronic diseases classified elsewhere: Secondary | ICD-10-CM

## 2019-12-04 DIAGNOSIS — I10 Essential (primary) hypertension: Secondary | ICD-10-CM

## 2019-12-04 DIAGNOSIS — E1159 Type 2 diabetes mellitus with other circulatory complications: Secondary | ICD-10-CM

## 2019-12-04 DIAGNOSIS — I152 Hypertension secondary to endocrine disorders: Secondary | ICD-10-CM

## 2019-12-04 MED ORDER — VITAMIN D (ERGOCALCIFEROL) 1.25 MG (50000 UNIT) PO CAPS: 50000.0000 [IU] | ORAL_CAPSULE | ORAL | 0 refills | Status: DC

## 2019-12-04 NOTE — Progress Notes (Signed)
Chief Complaint:   OBESITY [name] is here to discuss her progress with her obesity treatment plan along with follow-up of her obesity related diagnoses. [name] is on keeping a food journal and adhering to recommended goals of 1500 calories and 75 grams of protein and states she is following her eating plan approximately 90% of Cristina time. [name] states she is getting 5000 steps 4-5 times per week.  Today's visit was #: 2 Starting weight: 199 lb Starting date: 11/13/2019 Today's weight: 189 lbs Today's date: 12/04/2019 Total lbs lost to date: 10 lbs Total lbs lost since last in-office visit: 10 lbs  Interim History: [name] says she has been eating mostly chicken, fish, eggs, and some yogurt.  She is having a house built and says she will be living in a camper while it is being built.  She has an appointment with her GYN tomorrow to get her Nexplanon implant removed.  She wants to get an IUD.    [name] reports one low blood sugar episode in Cristina 40s.  Generally her blood sugar is in Cristina 100s.  It is 147 right now.  Subjective:   1. Type 1 diabetes mellitus without complication (HCC) [name] is on an insulin pump. Home glucose monitoring: is performed regularly.    Lab Results  Component Value Date   HGBA1C 8.0 (H) 09/09/2019   HGBA1C 9.0 04/05/2017   HGBA1C 11.2 (H) 11/24/2015   Lab Results  Component Value Date   LDLCALC 68 04/22/2014   CREATININE 1.62 (H) 11/13/2019   2. Chronic kidney disease, unspecified CKD stage This is stable.  She is followed by a nephrologist.  She last saw them last month.  Lab Results  Component Value Date   CREATININE 1.62 (H) 11/13/2019   CREATININE 1.69 (H) 10/14/2019   CREATININE 1.54 (H) 09/09/2019   Lab Results  Component Value Date   CREATININE 1.62 (H) 11/13/2019   BUN 40 (H) 11/13/2019   NA 136 11/13/2019   K 5.3 (H) 11/13/2019   CL 102 11/13/2019   CO2 21 11/13/2019   3. Vitamin D deficiency [name]'s Vitamin D level was  24.5 on 11/13/2019. She is not currently taking vit D. She denies nausea, vomiting or muscle weakness.  4. Elevated alkaline phosphatase level [name] has an elevated alk phos level.  Lab Results  Component Value Date   ALKPHOS 118 (H) 11/13/2019   5. Hypertension associated with diabetes ([name]) Review: taking medications as instructed, no medication side effects noted, no chest pain on exertion, no dyspnea on exertion, no swelling of ankles.  Blood pressure is not at goal at this time.  BP Readings from Last 3 Encounters:  12/04/19 135/88  11/13/19 115/74  11/04/19 (!) 133/54   6. Gastroparesis [name] has Cristina diagnosis of gastroparesis.  7. Anemia of chronic disease Hemoglobin has dropped since last month.  CBC Latest Ref Rng & Units 11/13/2019 11/13/2019 10/14/2019  WBC 3.4 - 10.8 x10E3/uL 7.8 - 7.9  Hemoglobin 11.1 - 15.9 g/dL 11.5 - 13.4  Hematocrit 34.0 - 46.6 % 35.7 39.1 41.9  Platelets 150 - 450 x10E3/uL 280 - 315   Lab Results  Component Value Date   IRON 41 11/13/2019   TIBC 267 11/13/2019   FERRITIN 77 11/13/2019   Lab Results  Component Value Date   VITAMINB12 603 11/13/2019   8. Bipolar affective disorder, remission status unspecified ([name]) [name] is taking Latuda, Buspar, and Lamictal.  Assessment/Plan:   1. Type 1 diabetes mellitus without  complication ([place]) Good blood sugar control is important to decrease Cristina likelihood of diabetic complications such as nephropathy, neuropathy, limb loss, blindness, coronary artery disease, and death. Intensive lifestyle modification including diet, exercise and weight loss are Cristina first line of treatment for diabetes.   2. Chronic kidney disease Will recheck labs today and send labs to her nephrologist.  Orders - Comprehensive metabolic panel - Magnesium  3. Vitamin D deficiency Low Vitamin D level contributes to fatigue and are associated with obesity, breast, and colon cancer. She agrees to take prescription Vitamin  D '@50'$ ,000 IU every week and will follow-up for routine testing of Vitamin D, at least 2-3 times per year to avoid over-replacement.  Orders - Vitamin D, Ergocalciferol, (DRISDOL) 1.25 MG (50000 UNIT) CAPS capsule; Take 1 capsule (50,000 Units total) by mouth every 7 (seven) days.  Dispense: 4 capsule; Refill: 0  4. Elevated alkaline phosphatase level Will recheck alk phos level today.  Orders - Alkaline phosphatase  5. Hypertension associated with diabetes ([name]) [name] is working on healthy weight loss and exercise to improve blood pressure control. We will watch for signs of hypotension as she continues her lifestyle modifications.  6. Gastroparesis This is stable at this time.  7. Anemia of chronic disease Check labs today and will send results to [name] at [facility].  Orders - CBC with Differential/Platelet  8. Bipolar affective disorder, remission status unspecified ([name]) This is controlled at this time.  9. Class 1 obesity with serious comorbidity and body mass index (BMI) of 30.0 to 30.9 in adult, unspecified obesity type [name] is currently in Cristina action stage of change. As such, her goal is to continue with weight loss efforts. She has agreed to keeping a food journal and adhering to recommended goals of 1500 calories and 75 grams of protein.   Exercise goals: As is.  Behavioral modification strategies: increasing lean protein intake and increasing water intake.  [name] has agreed to follow-up with our clinic in 2 weeks. She was informed of Cristina importance of frequent follow-up visits to maximize her success with intensive lifestyle modifications for her multiple health conditions.   [name] was informed we would discuss her lab results at her next visit unless there is a critical issue that needs to be addressed sooner. [name] agreed to keep her next visit at Cristina agreed upon time to discuss these results.  Objective:   Blood pressure 135/88, pulse (!) 107,  temperature 98.1 F (36.7 C), temperature source Oral, height '5\' 6"'$  (1.676 m), weight 189 lb (85.7 kg), SpO2 100 %. Body mass index is 30.51 kg/m.  General: Cooperative, alert, well developed, in no acute distress. HEENT: Conjunctivae and lids unremarkable. Cardiovascular: Regular rhythm.  Lungs: Normal work of breathing. Neurologic: No focal deficits.   Lab Results  Component Value Date   CREATININE 1.62 (H) 11/13/2019   BUN 40 (H) 11/13/2019   NA 136 11/13/2019   K 5.3 (H) 11/13/2019   CL 102 11/13/2019   CO2 21 11/13/2019   Lab Results  Component Value Date   ALT 12 11/13/2019   AST 15 11/13/2019   ALKPHOS 118 (H) 11/13/2019   BILITOT <0.2 11/13/2019   Lab Results  Component Value Date   HGBA1C 8.0 (H) 09/09/2019   HGBA1C 9.0 04/05/2017   HGBA1C 11.2 (H) 11/24/2015   HGBA1C 13.3 (H) 04/22/2014   HGBA1C 10.7 (H) 10/11/2013   Lab Results  Component Value Date   TSH 0.859 11/13/2019   Lab Results  Component  Value Date   CHOL 149 04/22/2014   HDL 71 04/22/2014   LDLCALC 68 04/22/2014   TRIG 49 04/22/2014   CHOLHDL 2.1 04/22/2014   Lab Results  Component Value Date   WBC 7.8 11/13/2019   HGB 11.5 11/13/2019   HCT 35.7 11/13/2019   MCV 92 11/13/2019   PLT 280 11/13/2019   Lab Results  Component Value Date   IRON 41 11/13/2019   TIBC 267 11/13/2019   FERRITIN 77 11/13/2019   Obesity Behavioral Intervention:   Approximately 15 minutes were spent on Cristina discussion below.  ASK: We discussed Cristina diagnosis of obesity with [name] today and [name] agreed to give [name] permission to discuss obesity behavioral modification therapy today.  ASSESS: [name] has Cristina diagnosis of obesity and her BMI today is 30.6. [place] is in Cristina action stage of change.   ADVISE: [name] was educated on Cristina multiple health risks of obesity as well as Cristina benefit of weight loss to improve her health. She was advised of Cristina need for long term treatment and Cristina importance of  lifestyle modifications to improve her current health and to decrease her risk of future health problems.  AGREE: Multiple dietary modification options and treatment options were discussed and [name] agreed to follow Cristina recommendations documented in Cristina above note.  ARRANGE: [name] was educated on Cristina importance of frequent visits to treat obesity as outlined per CMS and USPSTF guidelines and agreed to schedule her next follow up appointment today.  Attestation Statements:   Reviewed by clinician on day of visit: allergies, medications, problem list, medical history, surgical history, family history, social history, and previous encounter notes.  I, Water quality scientist, CMA, am acting as Location manager for [employer], DO.  I have reviewed Cristina above documentation for accuracy and completeness, and I agree with Cristina above. Briscoe Deutscher, DO

## 2019-12-05 ENCOUNTER — Ambulatory Visit (INDEPENDENT_AMBULATORY_CARE_PROVIDER_SITE_OTHER): Payer: Medicare Other | Admitting: Obstetrics and Gynecology

## 2019-12-05 ENCOUNTER — Other Ambulatory Visit (HOSPITAL_COMMUNITY)
Admission: RE | Admit: 2019-12-05 | Discharge: 2019-12-05 | Disposition: A | Discharge status: Home / Self Care | Payer: Medicare Other | Source: Ambulatory Visit | Attending: Obstetrics and Gynecology | Admitting: Obstetrics and Gynecology

## 2019-12-05 ENCOUNTER — Encounter: Payer: Self-pay | Admitting: Obstetrics and Gynecology

## 2019-12-05 VITALS — BP 138/86 | HR 86 | Ht 66.0 in | Wt 195.0 lb

## 2019-12-05 DIAGNOSIS — Z01419 Encounter for gynecological examination (general) (routine) without abnormal findings: Secondary | ICD-10-CM

## 2019-12-05 DIAGNOSIS — Z124 Encounter for screening for malignant neoplasm of cervix: Secondary | ICD-10-CM | POA: Diagnosis present

## 2019-12-05 DIAGNOSIS — Z3046 Encounter for surveillance of implantable subdermal contraceptive: Secondary | ICD-10-CM

## 2019-12-05 DIAGNOSIS — Z30430 Encounter for insertion of intrauterine contraceptive device: Secondary | ICD-10-CM

## 2019-12-05 DIAGNOSIS — Z1151 Encounter for screening for human papillomavirus (HPV): Secondary | ICD-10-CM | POA: Diagnosis not present

## 2019-12-05 DIAGNOSIS — Z01411 Encounter for gynecological examination (general) (routine) with abnormal findings: Secondary | ICD-10-CM | POA: Diagnosis not present

## 2019-12-05 DIAGNOSIS — N939 Abnormal uterine and vaginal bleeding, unspecified: Secondary | ICD-10-CM

## 2019-12-05 DIAGNOSIS — N938 Other specified abnormal uterine and vaginal bleeding: Secondary | ICD-10-CM

## 2019-12-05 DIAGNOSIS — Z1239 Encounter for other screening for malignant neoplasm of breast: Secondary | ICD-10-CM

## 2019-12-05 LAB — COMPREHENSIVE METABOLIC PANEL
ALT: 23 IU/L (ref 0–32)
AST: 21 IU/L (ref 0–40)
Albumin/Globulin Ratio: 2.2 (ref 1.2–2.2)
Albumin: 4.9 g/dL — ABNORMAL HIGH (ref 3.8–4.8)
Alkaline Phosphatase: 148 IU/L — ABNORMAL HIGH (ref 39–117)
BUN/Creatinine Ratio: 21 (ref 9–23)
BUN: 32 mg/dL — ABNORMAL HIGH (ref 6–20)
Bilirubin Total: <0.2 mg/dL (ref 0.0–1.2)
CO2: 27 mmol/L (ref 20–29)
Calcium: 10.4 mg/dL — ABNORMAL HIGH (ref 8.7–10.2)
Chloride: 95 mmol/L — ABNORMAL LOW (ref 96–106)
Creatinine, Ser: 1.56 mg/dL — ABNORMAL HIGH (ref 0.57–1.00)
GFR calc Af Amer: 50 mL/min/{1.73_m2} — ABNORMAL LOW (ref >59)
GFR calc non Af Amer: 43 mL/min/{1.73_m2} — ABNORMAL LOW (ref >59)
Globulin, Total: 2.2 g/dL (ref 1.5–4.5)
Glucose: 100 mg/dL — ABNORMAL HIGH (ref 65–99)
Potassium: 4.9 mmol/L (ref 3.5–5.2)
Sodium: 137 mmol/L (ref 134–144)
Total Protein: 7.1 g/dL (ref 6.0–8.5)

## 2019-12-05 LAB — CBC WITH DIFFERENTIAL/PLATELET
Basophils Absolute: 0.1 10*3/uL (ref 0.0–0.2)
Basos: 1 %
EOS (ABSOLUTE): 0.3 10*3/uL (ref 0.0–0.4)
Eos: 4 %
Hematocrit: 39.3 % (ref 34.0–46.6)
Hemoglobin: 13.3 g/dL (ref 11.1–15.9)
Immature Grans (Abs): 0 10*3/uL (ref 0.0–0.1)
Immature Granulocytes: 0 %
Lymphocytes Absolute: 2.2 10*3/uL (ref 0.7–3.1)
Lymphs: 29 %
MCH: 29.8 pg (ref 26.6–33.0)
MCHC: 33.8 g/dL (ref 31.5–35.7)
MCV: 88 fL (ref 79–97)
Monocytes Absolute: 0.5 10*3/uL (ref 0.1–0.9)
Monocytes: 7 %
Neutrophils Absolute: 4.5 10*3/uL (ref 1.4–7.0)
Neutrophils: 59 %
Platelets: 316 10*3/uL (ref 150–450)
RBC: 4.46 x10E6/uL (ref 3.77–5.28)
RDW: 11.7 % (ref 11.7–15.4)
WBC: 7.5 10*3/uL (ref 3.4–10.8)

## 2019-12-05 LAB — MAGNESIUM: Magnesium: 1.7 mg/dL (ref 1.6–2.3)

## 2019-12-05 NOTE — Progress Notes (Signed)
Gynecology Annual Exam   PCP: [name], MD  Chief Complaint:  Chief Complaint  Patient presents with  . Gynecologic Exam  . Nexplanon removal    Discuss MIrena placement    History of Present Illness: Patient is a 34 y.o. G2P1 presents for annual exam. The patient has no complaints today.   LMP: No LMP recorded. Patient has had an implant. No menses on nexplanon  The patient is sexually active. She currently uses Nexplanon for contraception. She denies dyspareunia.  The patient does perform self breast exams.  There is notable family history of breast or ovarian cancer in her family two maternal aunts with endometrial cancer, on of which also developed breast cancer (41).  The patient wears seatbelts: yes.   The patient has regular exercise: not asked.    The patient denies current symptoms of depression.    Review of Systems: Review of Systems  Constitutional: Negative for chills and fever.  HENT: Negative for congestion.   Respiratory: Negative for cough and shortness of breath.   Cardiovascular: Negative for chest pain and palpitations.  Gastrointestinal: Negative for abdominal pain, constipation, diarrhea, heartburn, nausea and vomiting.  Genitourinary: Negative for dysuria, frequency and urgency.  Skin: Negative for itching and rash.  Neurological: Negative for dizziness and headaches.  Endo/Heme/Allergies: Negative for polydipsia.  Psychiatric/Behavioral: Negative for depression.    Past Medical History:  Past Medical History:  Diagnosis Date  . Anemia   . Anxiety   . Asthma   . Asthma 11/10/2013  . Back pain   . Bipolar 1 disorder ([place])   . Chest pain   . Chronic renal failure syndrome, stage 3 (moderate)   . Complication of anesthesia   . Constipation   . Depression   . Depression   . Diabetic gastroparesis ([place])   . Diabetic neuropathy, type I diabetes mellitus ([place])   . Edema, lower extremity   . Gallbladder disease   . Gastroparesis   .  GERD (gastroesophageal reflux disease)   . Headache(784.0)   . Hypertension   . Joint pain   . Kidney stones   . Palpitations   . Polyneuropathy in diabetes(357.2)   . PONV (postoperative nausea and vomiting)   . 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 Shortness of breath   . Tachycardia    baseline tachycardia   . 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 ANAL RECTAL MANOMETRY N/A 03/25/2018   Procedure: ANO RECTAL MANOMETRY;  Surgeon: Doran Stabler, MD;  Location: [facility];  Service: Gastroenterology;  Laterality: N/A;  . CARPAL TUNNEL RELEASE Left 10/16/2019   Procedure: LEFT CARPAL TUNNEL RELEASE;  Surgeon: Carole Civil, MD;  Location: [facility];  Service: Orthopedics;  Laterality: Left;  . CESAREAN SECTION     x 2  . CHOLECYSTECTOMY    . EYE SURGERY    . REFRACTIVE SURGERY Bilateral     Gynecologic History:  No LMP recorded. Patient has had an implant. Contraception: Nexplanon Pap: 10/13/2016 NIL HPV positive [facility])     Obstetric History: G2P1  Family History:  Family History  Problem Relation Age of Onset  . Hypertension Other   . Diabetes Mother        type 2  . Hypertension Mother   . Obesity Mother   . Diabetes Brother  type 1  . Renal Disease Brother        renal failure  . Hypertension Brother   . Diabetes Father   . Hypertension Father   . High Cholesterol Father   . Sleep apnea Father   . Obesity Father   . Breast cancer Maternal Aunt 64  . Colon cancer Maternal Aunt   . Uterine cancer Maternal Aunt 65       same as breast cancer  . Uterine cancer Maternal Aunt 61  . Rectal cancer Neg Hx   . Esophageal cancer Neg Hx     Social History:  Social History   Socioeconomic History  . Marital status: Legally Separated    Spouse name: Not on file  . Number of children: 2  . Years of education: Not on file   . Highest education level: Not on file  Occupational History  . Occupation: Home maker  Tobacco Use  . Smoking status: Never Smoker  . Smokeless tobacco: Never Used  Substance and Sexual Activity  . Alcohol use: No  . Drug use: No  . Sexual activity: Yes    Partners: Male    Birth control/protection: Implant  Other Topics Concern  . Not on file  Social History Narrative   Pt has a daughter and boyfriend.          Social Determinants of Health   Financial Resource Strain:   . Difficulty of Paying Living Expenses: Not on file  Food Insecurity:   . Worried About Charity fundraiser in the Last Year: Not on file  . Ran Out of Food in the Last Year: Not on file  Transportation Needs:   . Lack of Transportation (Medical): Not on file  . Lack of Transportation (Non-Medical): Not on file  Physical Activity:   . Days of Exercise per Week: Not on file  . Minutes of Exercise per Session: Not on file  Stress:   . Feeling of Stress : Not on file  Social Connections:   . Frequency of Communication with Friends and Family: Not on file  . Frequency of Social Gatherings with Friends and Family: Not on file  . Attends Religious Services: Not on file  . Active Member of Clubs or Organizations: Not on file  . Attends Archivist Meetings: Not on file  . Marital Status: Not on file  Intimate Partner Violence:   . Fear of Current or Ex-Partner: Not on file  . Emotionally Abused: Not on file  . Physically Abused: Not on file  . Sexually Abused: Not on file    Allergies:  Allergies  Allergen Reactions  . Toradol [Ketorolac Tromethamine]     Cannot take d/t kidney issues   . Ceftin Rash    Medications: Prior to Admission medications   Medication Sig Start Date End Date Taking? Authorizing Provider  acetaminophen (TYLENOL) 500 MG tablet Take 1,000 mg by mouth every 6 (six) hours as needed for mild pain or moderate pain.    Yes [provider]  albuterol  (PROVENTIL) (2.5 MG/3ML) 0.083% nebulizer solution Take 3 mLs (2.5 mg total) by nebulization every 6 (six) hours as needed for wheezing or shortness of breath. 11/12/18  Yes Delsa Grana, PA-C  albuterol (VENTOLIN HFA) 108 (90 Base) MCG/ACT inhaler INHALE 2 PUFFS BY MOUTH EVERY 6 HOURS AS NEEDED FOR SHORTNESS OF BREATH Patient taking differently: Inhale 2 puffs into the lungs every 6 (six) hours as needed for wheezing or shortness of breath.  09/15/19  Yes [name], MD  AMBULATORY NON FORMULARY MEDICATION DOMPERIDONE 10 mg, twice a day. One with breakfast One with supper Dispense 60 tablets  2 refills 04/16/19  Yes Danis, Estill Cotta III, MD  busPIRone (BUSPAR) 15 MG tablet TAKE 2 TABLETS (30 MG TOTAL) BY MOUTH 2 (TWO) TIMES DAILY. 09/22/19  Yes Cottle, Billey Co., MD  carbamazepine (EQUETRO) 200 MG CP12 12 hr capsule Take 2 capsules in the morning and 3 at bedtime Patient taking differently: Take 400-600 mg by mouth See admin instructions. Take 2 capsules (400 mg) by mouth in the morning & take 3 capsules (600 mg) by mouth at bedtime. 08/20/19  Yes Cottle, Billey Co., MD  ciclopirox [facility]) 8 % solution Apply one coat to each toenail daily. Remove weekly with polish remover. 11/04/18  Yes [place], Jennifer L, DPM  EMGALITY 120 MG/ML SOSY Inject into the skin every 30 (thirty) days.  08/05/19  Yes [provider]  enalapril (VASOTEC) 20 MG tablet Take 20 mg by mouth daily.    Yes [provider]  etonogestrel (NEXPLANON) 68 MG IMPL implant 68 mg by Subdermal route once.    Yes [provider]  fluticasone (FLONASE) 50 MCG/ACT nasal spray Place 2 sprays into both nostrils daily. Patient taking differently: Place 2 sprays into both nostrils daily as needed (seasonal allergies.).  11/12/18  Yes Delsa Grana, PA-C  furosemide (LASIX) 40 MG tablet Take 40 mg by mouth every other day.    Yes [provider]  gabapentin (NEURONTIN) 600 MG tablet Take 1 tablet (600 mg  total) by mouth 3 (three) times daily. 11/19/19  Yes Cottle, Billey Co., MD  GLUCAGON EMERGENCY 1 MG injection Inject 1 mg into the skin as needed (hypoglycemia.).  04/30/19  Yes [provider]  HYDROcodone-acetaminophen (NORCO/VICODIN) 5-325 MG tablet One tablet every four hours as needed for acute pain.  Limit of five days per [place] [facility]. 11/26/19  Yes Sanjuana Kava, MD  Insulin Human (INSULIN PUMP) SOLN Inject 1 each into the skin 3 times daily with meals, bedtime and 2 AM. Novolog: 36-38 units per day   Yes [provider]  lamoTRIgine (LAMICTAL) 150 MG tablet TAKE 2 TABLETS (300 MG TOTAL) BY MOUTH 2 (TWO) TIMES DAILY. 08/17/19  Yes Cottle, Billey Co., MD  LATUDA 120 MG TABS TAKE 1 TABLET BY MOUTH EVERY DAY 09/29/19  Yes Cottle, Billey Co., MD  mometasone-formoterol [facility]) 100-5 MCG/ACT AERO Inhale 2 puffs into the lungs 2 (two) times daily. 03/15/18  Yes Delsa Grana, PA-C  montelukast (SINGULAIR) 10 MG tablet Take 1 tablet (10 mg total) by mouth at bedtime. 10/25/18  Yes [name], MD  NOVOLOG 100 UNIT/ML injection Inject 36-38 Units into the skin daily. Via insulin pump per sliding scale 10/04/18  Yes [provider]  pantoprazole (PROTONIX) 20 MG tablet Take 1 tablet (20 mg total) by mouth daily before breakfast. Take at least 30 minutes before breakfast 08/15/19  Yes Willia Craze, NP  Pramipexole Dihydrochloride 0.75 MG TB24 TAKE 1 TABLET BY MOUTH EVERY EVENING Patient taking differently: Take 0.75 mg by mouth every evening.  09/22/19  Yes Cottle, Billey Co., MD  promethazine (PHENERGAN) 25 MG tablet Take 0.5 tablets (12.5 mg total) by mouth every 8 (eight) hours as needed for nausea or vomiting. Patient taking differently: Take 12.5-25 mg by mouth every 8 (eight) hours as needed for nausea or vomiting.  04/15/19  Yes Wilfrid Lund  L III, MD  Prucalopride Succinate (MOTEGRITY) 2 MG TABS Take 1 tablet (2 mg total) by mouth every morning. 08/06/19  Yes  Willia Craze, NP  SUMAtriptan Succinate 3 MG/0.5ML SOAJ Inject into the skin. To use as needed for migraines   Yes [provider]  traZODone (DESYREL) 50 MG tablet Take 1 tablet (50 mg total) by mouth at bedtime. Patient taking differently: Take 50 mg by mouth at bedtime as needed for sleep.  12/03/18  Yes Cottle, Billey Co., MD  Ubrogepant (UBRELVY) 100 MG TABS Take 100 mg by mouth daily as needed (migraine headaches.).    Yes [provider]  Urea 40 % LOTN Apply 1 application topically daily. Apply only to calluses 06/02/19  Yes Trula Slade, DPM  Vitamin D, Ergocalciferol, (DRISDOL) 1.25 MG (50000 UNIT) CAPS capsule Take 1 capsule (50,000 Units total) by mouth every 7 (seven) days. 12/04/19  Yes Briscoe Deutscher, DO    Physical Exam Vitals: Blood pressure 138/86, pulse 86, height '5\' 6"'$  (1.676 m), weight 195 lb (88.5 kg). Body mass index is 31.47 kg/m.   General: NAD HEENT: normocephalic, anicteric Thyroid: no enlargement, no palpable nodules Pulmonary: No increased work of breathing, CTAB Cardiovascular: RRR, distal pulses 2+ Breast: Breast symmetrical, no tenderness, no palpable nodules or masses, no skin or nipple retraction present, no nipple discharge.  No axillary or supraclavicular lymphadenopathy. Abdomen: NABS, soft, non-tender, non-distended.  Umbilicus without lesions.  No hepatomegaly, splenomegaly or masses palpable. No evidence of hernia  Genitourinary:  External: Normal external female genitalia.  Normal urethral meatus, normal Bartholin's and Skene's glands.    Vagina: Normal vaginal mucosa, no evidence of prolapse.    Cervix: Grossly normal in appearance, no bleeding  Uterus: Non-enlarged, mobile, normal contour.  No CMT  Adnexa: ovaries non-enlarged, no adnexal masses  Rectal: deferred  Lymphatic: no evidence of inguinal lymphadenopathy Extremities: no edema, erythema, or tenderness Neurologic: Grossly intact Psychiatric: mood  appropriate, affect full  Female chaperone present for pelvic and breast  portions of the physical exam   GYNECOLOGY PROCEDURE NOTE  Implanon removal discussed in detail.  Risks of infection, bleeding, nerve injury all reviewed.  Patient understands risks and desires to proceed.  Verbal consent obtained.  Patient is certain she wants the implanon removed.  All questions answered.  Procedure: Patient placed in dorsal supine with left arm above head, elbow flexed at 90 degrees, arm resting on examination table.  Implanon identified without problems.  Betadine scrub x3.  1 ml of 1% lidocaine injected under implanon device without problems.  Sterile gloves applied.  Small 0.5cm incision made at distal tip of implanon device with 11 blade scalpel.  Implanon brought to incision and grasped with a small kelly clamp.  Implanon removed intact without problems.  Pressure applied to incision.  Hemostasis obtained.  Steri-strips applied, followed by bandage and compression dressing.  Patient tolerated procedure well.  No complications.   Assessment: 34 y.o. year old female now s/p uncomplicated implanon removal.  Plan: 1.  Patient given post procedure precautions and asked to call for fever, chills, redness or drainage from her incision, bleeding from incision.  She understands she will likely have a small bruise near site of removal and can remove bandage tomorrow and steri-strips in approximately 1 week.  2) Contraception IUD placed today  GYNECOLOGY OFFICE PROCEDURE NOTE  Melannie Desiree Lucy is a 34 y.o. G2P1 here for a Mirena IUD insertion. No GYN concerns.  The patient is currently using  nexplanon for contraception and her LMP is No LMP recorded. Patient has had an implant..  The indication for her IUD is contraception & cycle control.  IUD Insertion Procedure Note Patient identified, informed consent performed, consent signed.   Discussed risks of irregular bleeding, cramping, infection,  malpositioning, expulsion or uterine perforation of the IUD (1:1000 placements)  which may require further procedure such as laparoscopy.  IUD while effective at preventing pregnancy do not prevent transmission of sexually transmitted diseases and use of barrier methods for this purpose was discussed. Time out was performed.  Urine pregnancy test negative.  Speculum placed in the vagina.  Cervix visualized.  Cleaned with Betadine x 2.  Grasped anteriorly with a single tooth tenaculum.  Uterus sounded to 9.5 cm. IUD placed per manufacturer's recommendations.  Strings trimmed to 3 cm. Tenaculum was removed, good hemostasis noted.  Patient tolerated procedure well.   Patient was given post-procedure instructions.  She was advised to have backup contraception for one week.  Patient was also asked to check IUD strings periodically and follow up in 6 weeks for IUD check.   Assessment: 34 y.o. G2P1 routine annual exam  Plan: Problem List Items Addressed This Visit    None    Visit Diagnoses    Screening for malignant neoplasm of cervix    -  Primary   Relevant Orders   Cytology - PAP   Encounter for gynecological examination without abnormal finding       Breast screening       Nexplanon removal       Encounter for IUD insertion       Abnormal uterine bleeding          1) STI screening  was notoffered and therefore not obtained  2)  ASCCP guidelines and rational discussed.  Patient opts for every 3 years screening interval  3) Contraception - the patient is currently using  Nexplanon.  She is interested in changing to [place] - also heavier cycles previously so Mirena for cycle control as well as contraception  4) Routine healthcare maintenance including cholesterol, diabetes screening discussed managed by PCP  5) Counseling BRCA I offered referral to genetic counseling for discussion MyRisk testing because of her significant family history of endometrial cancer. We discussed incidence of  breast cancer 126 cases per 100,000 women a year of lifetime risk for an average woman of 1 in 8.  The majority of breast cancers will be identified in patients over 91.  Only 10% of breast cancer have an underlying genetic component.  Concerns for genetic are raised if clustering of cased on one side of the family, female breast cancers, family history of ovarian cancers, or age of onset personal or in family member under age 45.  If a mutation is identified other family members could be tested and would have the opportunity to take advantage of approaches to prevention and early detection of breast and ovarian cancer.     No follow-ups on file.   Malachy Mood, MD, [facility], [facility] 12/05/2019, 11:28 AM

## 2019-12-07 DIAGNOSIS — E109 Type 1 diabetes mellitus without complications: Secondary | ICD-10-CM | POA: Diagnosis not present

## 2019-12-08 ENCOUNTER — Telehealth: Payer: Self-pay

## 2019-12-08 NOTE — Telephone Encounter (Signed)
This is the one I was telling you about that has Medicare/Medicaid. Please send her the Dx codes for Breast cancer.

## 2019-12-08 NOTE — Telephone Encounter (Signed)
Pt called her insurance to see if she was covered for the genetic testing, she needs a code to give the insurance company. Please advise

## 2019-12-10 LAB — CYTOLOGY - PAP
Comment: NEGATIVE
Diagnosis: NEGATIVE
High risk HPV: NEGATIVE

## 2019-12-10 NOTE — Telephone Encounter (Signed)
Working with Leda Gauze on this, called pt no answer so I left msg so she won't think we have forgotten about her.

## 2019-12-10 NOTE — Telephone Encounter (Signed)
Leda Gauze called back with diagnose code Z80.3. Leda Gauze would like for you to check with [employer] if this testing can wait for about a month since pt has no personal history affecting her? Leda Gauze said [facility] is currently working on something with Medicare to have/help pts cover this test but its probably going to be about a month or so before she has any more info.

## 2019-12-10 NOTE — Telephone Encounter (Signed)
FYI, is it ok for pt to wait?

## 2019-12-11 NOTE — Telephone Encounter (Signed)
Lvm for pt to call either [place] or myself regarding [facility].

## 2019-12-11 NOTE — Telephone Encounter (Signed)
yes

## 2019-12-11 NOTE — Telephone Encounter (Signed)
Pt aware of waiting a few weeks while [facility] works with medicare to have costs covered.

## 2019-12-17 ENCOUNTER — Encounter: Payer: Self-pay | Admitting: Psychiatry

## 2019-12-17 ENCOUNTER — Ambulatory Visit (INDEPENDENT_AMBULATORY_CARE_PROVIDER_SITE_OTHER): Payer: Medicare Other | Admitting: Psychiatry

## 2019-12-17 DIAGNOSIS — F3162 Bipolar disorder, current episode mixed, moderate: Secondary | ICD-10-CM | POA: Diagnosis not present

## 2019-12-17 DIAGNOSIS — F4001 Agoraphobia with panic disorder: Secondary | ICD-10-CM

## 2019-12-17 DIAGNOSIS — F411 Generalized anxiety disorder: Secondary | ICD-10-CM | POA: Diagnosis not present

## 2019-12-17 DIAGNOSIS — G2581 Restless legs syndrome: Secondary | ICD-10-CM

## 2019-12-17 DIAGNOSIS — F5105 Insomnia due to other mental disorder: Secondary | ICD-10-CM | POA: Diagnosis not present

## 2019-12-17 NOTE — Progress Notes (Signed)
[name] [id number] [birth year] 34 y.o.   Virtual Visit via [place]  I connected with pt by WebEx and verified that I am speaking with the correct person using two identifiers.   I discussed the limitations, risks, security and privacy concerns of performing an evaluation and management service by [name] and the availability of in person appointments. I also discussed with the patient that there may be a patient responsible charge related to this service. The patient expressed understanding and agreed to proceed.  I discussed the assessment and treatment plan with the patient. The patient was provided an opportunity to ask questions and all were answered. The patient agreed with the plan and demonstrated an understanding of the instructions.   The patient was advised to call back or seek an in-person evaluation if the symptoms worsen or if the condition fails to improve as anticipated.  I provided 30 minutes of video time during this encounter. The call started at 330 and ended at 4:00. The patient was located at home and the provider was located office.   Subjective:   Patient ID:  [name] is a 34 y.o. (DOB Mar 08, 1986) female.  Chief Complaint:  Chief Complaint  Patient presents with  . Follow-up    Medication Management  . Depression    Medication Management  . Other    Bipolar 1  . Anxiety  . Sleeping Problem    Depression        [name] presents to the office today for follow-up of bipolar disorder and generalized anxiety disorder.  When seen June 05, 2019.  The following changes were made: Change pramipexole ER and increase to 0.75 mg pm to rid nausea and improve mood in the afternoon.  For tiredness in the afternoon switch Equetro to 400 mg AM , 1@ 6 and 1 at 11. For panic in daytime start gabapentin 300 mg each am as preventative.  She's only taking it prn now. We continued unchanged Latuda 120 mg and lamotrigine 300 mg twice daily for  bipolar disorder. She just got the ER pramipexole about a week ago.  Can't tell difference with nausea DT  gastroparesis worse lately with constipation Less afternoon tiredness with current split Equetro dose.    Last seen July 22, 2019.  Meds were not changed at that visit.  She called back to October 20 wanting to try valproate because of racing thoughts and difficulty sleeping.  Stating the trazodone was not working.  She was told it was okay to start Depakote ER 500 mg nightly and then gradually increased to 1500 mg nightly.  She called back again on October 6 and this physician noted the following: RTC Pt called to report new med she started having a lot of side effects mood swings, sounds in ears, suicidal thoughts, tremors. Causing issues with husband She is currently taking Depakote ER 1500 mg nightly. She commits to safety. The only symptom directly attributable to Depakote would be the tremor.  However its presence would indicate she is not going to be able to tolerate a higher dose of Depakote to control her mood symptoms.  Therefore we will wean the Depakote. In order to control the mood symptoms we will increase the Equetro at the evening dose which should not cause significant side effects problems during the day but help with mood stability.  This increase should also make it easier to wean the Depakote quickly.  She agrees to this plan.  She will call back  if the suicidal thoughts become too intense to tolerate. Reduce Depakote ER 500 mg tablets to 1 a night for 4 nights then stop Increase Equetro 200 mg capsules to 2 in the morning and 3 nightly. [name], MD, DFAPA  She's been back on Equetro and doing OK. 2 weeks leading to Xmas with a lot of anger and lashing out a good bit.  Had run out of mirapex and Latuda for a couple of days in that time frame.  Crying is less now.  No SE CBZ now.  A lot of migraines off her Trokendi bc risk kidney stones.  Plans Botox for HA.   Usually sleeping OK without trazodone.   Still taking Latuda, buspirone, gabapentin, lamotrigine 300 mg BID.    Last seen 12.30.21 without med change.   Since then increased gabapentin for anxiety to 600 BID-TID which helped but caused some tiredness.  Needing trazodone more bc EMA/EFA.  She's stopped caffeine.  Mood stability is getting better despite a lot of stress. $ stress and racing thoughts at night and scattered some with stressors.  Depression is mild lately.  Took trip with H last week helped.  Coming up on 2 year anniversary of B's death.  B diabetic died last year with DM and kidney failure. Not as much anger irritability.  Some mood swings early FEB but better now.  Custody battle with exH.  He's nicer to her now.   Weight going up. Hates weight gain effects of meds.    Consistent with meds.  No sig SI other than fleeting rarely.  Scattered concentration and racing thoughts randomly.   Sleep OK.  Remarried April 2020.  34 yo D ADD is stressful and disrespectful. Disability review approved mostly for psych reasons.  Going to [facility] weight [facility] and just started vitamin D. Level 24.5.  Past Psychiatric Medication Trials: Latuda 120, lamotrigine 300 twice daily, Equetro 900 daily,  brief Vraylar, lithium felt slowed down, Seroquel, Abilify 15, Depakote 1500 sertraline, duloxetine,  gabapentin 600 mg 3 times daily tiredness,  buspirone 30 twice daily,, clonazepam, trazodone, Xanax , hydroxyzine NR,  History of suicidal ideation and about 5 suicide attempts with about 4 psychiatric hospitalizations  Review of Systems:  Review of Systems  Gastrointestinal: Negative for nausea and vomiting.  Musculoskeletal: Positive for arthralgias.  Neurological: Negative for tremors and weakness.  Psychiatric/Behavioral: Positive for depression and sleep disturbance.    Medications: I have reviewed the patient's current medications.  Current Outpatient Medications  Medication Sig Dispense  Refill  . acetaminophen (TYLENOL) 500 MG tablet Take 1,000 mg by mouth every 6 (six) hours as needed for mild pain or moderate pain.     Marland Kitchen albuterol (PROVENTIL) (2.5 MG/3ML) 0.083% nebulizer solution Take 3 mLs (2.5 mg total) by nebulization every 6 (six) hours as needed for wheezing or shortness of breath. 150 mL 1  . albuterol (VENTOLIN HFA) 108 (90 Base) MCG/ACT inhaler INHALE 2 PUFFS BY MOUTH EVERY 6 HOURS AS NEEDED FOR SHORTNESS OF BREATH (Patient taking differently: Inhale 2 puffs into the lungs every 6 (six) hours as needed for wheezing or shortness of breath. ) 8.5 g 3  . AMBULATORY NON FORMULARY MEDICATION DOMPERIDONE 10 mg, twice a day. One with breakfast One with supper Dispense 60 tablets  2 refills 60 tablet 2  . busPIRone (BUSPAR) 15 MG tablet TAKE 2 TABLETS (30 MG TOTAL) BY MOUTH 2 (TWO) TIMES DAILY. 120 tablet 1  . carbamazepine (EQUETRO) 200 MG CP12 12 hr capsule  Take 2 capsules in the morning and 3 at bedtime (Patient taking differently: See admin instructions. Take 2 capsules (400 mg) by mouth in the morning & take 3 capsules (600 mg) by mouth at bedtime.) 150 capsule 2  . ciclopirox (PENLAC) 8 % solution Apply one coat to each toenail daily. Remove weekly with polish remover. 6.6 mL 11  . EMGALITY 120 MG/ML SOSY Inject into the skin every 30 (thirty) days.     . enalapril (VASOTEC) 20 MG tablet Take 20 mg by mouth daily.     Marland Kitchen etonogestrel (NEXPLANON) 68 MG IMPL implant 68 mg by Subdermal route once.     . fluticasone (FLONASE) 50 MCG/ACT nasal spray Place 2 sprays into both nostrils daily. (Patient taking differently: Place 2 sprays into both nostrils daily as needed (seasonal allergies.). ) 16 g 2  . furosemide (LASIX) 40 MG tablet Take 40 mg by mouth every other day.     . gabapentin (NEURONTIN) 600 MG tablet Take 1 tablet (600 mg total) by mouth 3 (three) times daily. 90 tablet 2  . GLUCAGON EMERGENCY 1 MG injection Inject 1 mg into the skin as needed (hypoglycemia.).     Marland Kitchen  HYDROcodone-acetaminophen (NORCO/VICODIN) 5-325 MG tablet One tablet every four hours as needed for acute pain.  Limit of five days per [place]. 30 tablet 0  . Insulin Human (INSULIN PUMP) SOLN Inject 1 each into the skin 3 times daily with meals, bedtime and 2 AM. Novolog: 36-38 units per day    . lamoTRIgine (LAMICTAL) 150 MG tablet TAKE 2 TABLETS (300 MG TOTAL) BY MOUTH 2 (TWO) TIMES DAILY. 120 tablet 2  . LATUDA 120 MG TABS TAKE 1 TABLET BY MOUTH EVERY DAY 30 tablet 1  . mometasone-formoterol (DULERA) 100-5 MCG/ACT AERO Inhale 2 puffs into the lungs 2 (two) times daily. 1 Inhaler 5  . montelukast (SINGULAIR) 10 MG tablet Take 1 tablet (10 mg total) by mouth at bedtime. 90 tablet 3  . NOVOLOG 100 UNIT/ML injection Inject 36-38 Units into the skin daily. Via insulin pump per sliding scale    . pantoprazole (PROTONIX) 20 MG tablet Take 1 tablet (20 mg total) by mouth daily before breakfast. Take at least 30 minutes before breakfast 30 tablet 3  . Pramipexole Dihydrochloride 0.75 MG TB24 TAKE 1 TABLET BY MOUTH EVERY EVENING (Patient taking differently: Take 0.75 mg by mouth every evening. ) 30 tablet 1  . promethazine (PHENERGAN) 25 MG tablet Take 0.5 tablets (12.5 mg total) by mouth every 8 (eight) hours as needed for nausea or vomiting. (Patient taking differently: Take 12.5-25 mg by mouth every 8 (eight) hours as needed for nausea or vomiting. ) 30 tablet 1  . Prucalopride Succinate (MOTEGRITY) 2 MG TABS Take 1 tablet (2 mg total) by mouth every morning. 30 tablet 1  . SUMAtriptan Succinate 3 MG/0.5ML SOAJ Inject into the skin. To use as needed for migraines    . traZODone (DESYREL) 50 MG tablet Take 1 tablet (50 mg total) by mouth at bedtime. (Patient taking differently: Take 50 mg by mouth at bedtime as needed for sleep. ) 30 tablet 2  . Ubrogepant (UBRELVY) 100 MG TABS Take 100 mg by mouth daily as needed (migraine headaches.).     Marland Kitchen Urea 40 % LOTN Apply 1 application topically daily.  Apply only to calluses 325 mL 0  . Vitamin D, Ergocalciferol, (DRISDOL) 1.25 MG (50000 UNIT) CAPS capsule Take 1 capsule (50,000 Units total) by mouth every 7 (  seven) days. 4 capsule 0   No current facility-administered medications for this visit.    Medication Side Effects: tired in the afternoon.  Allergies:  Allergies  Allergen Reactions  . Toradol [Ketorolac Tromethamine]     Cannot take d/t kidney issues   . Ceftin Rash    Past Medical History:  Diagnosis Date  . Anemia   . Anxiety   . Asthma   . Asthma 11/10/2013  . Back pain   . Bipolar 1 disorder ([place])   . Chest pain   . Chronic renal failure syndrome, stage 3 (moderate)   . Complication of anesthesia   . Constipation   . Depression   . Depression   . Diabetic gastroparesis ([place])   . Diabetic neuropathy, type I diabetes mellitus ([place])   . Edema, lower extremity   . Gallbladder disease   . Gastroparesis   . GERD (gastroesophageal reflux disease)   . Headache(784.0)   . Hypertension   . Joint pain   . Kidney stones   . Palpitations   . Polyneuropathy in diabetes(357.2)   . PONV (postoperative nausea and vomiting)   . 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 Shortness of breath   . Tachycardia    baseline tachycardia   . 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 Hypertension Other   . Diabetes Mother        type 2  . Hypertension Mother   . Obesity Mother   . Diabetes Brother        type 1  . Renal Disease Brother        renal failure  . Hypertension Brother   . Diabetes Father   . Hypertension Father   . High Cholesterol Father   . Sleep apnea Father   . Obesity Father   . Breast cancer Maternal Aunt 64  . Colon cancer Maternal Aunt   . Uterine cancer Maternal Aunt 65       same as breast cancer  . Uterine cancer Maternal Aunt 61  . Rectal cancer Neg Hx   . Esophageal cancer Neg Hx     Social  History   Socioeconomic History  . Marital status: Legally Separated    Spouse name: Not on file  . Number of children: 2  . Years of education: Not on file  . Highest education level: Not on file  Occupational History  . Occupation: Home maker  Tobacco Use  . Smoking status: Never Smoker  . Smokeless tobacco: Never Used  Substance and Sexual Activity  . Alcohol use: No  . Drug use: No  . Sexual activity: Yes    Partners: Male    Birth control/protection: Implant  Other Topics Concern  . Not on file  Social History Narrative   Pt has a daughter and boyfriend.          Social Determinants of Health   Financial Resource Strain:   . Difficulty of Paying Living Expenses: Not on file  Food Insecurity:   . Worried About Charity fundraiser in the Last Year: Not on file  . Ran Out of Food in the Last Year: Not on file  Transportation Needs:   . Lack of Transportation (Medical): Not on file  . Lack of Transportation (Non-Medical): Not on file  Physical Activity:   . Days of Exercise per Week: Not on file  .  Minutes of Exercise per Session: Not on file  Stress:   . Feeling of Stress : Not on file  Social Connections:   . Frequency of Communication with Friends and Family: Not on file  . Frequency of Social Gatherings with Friends and Family: Not on file  . Attends Religious Services: Not on file  . Active Member of Clubs or Organizations: Not on file  . Attends Archivist Meetings: Not on file  . Marital Status: Not on file  Intimate Partner Violence:   . Fear of Current or Ex-Partner: Not on file  . Emotionally Abused: Not on file  . Physically Abused: Not on file  . Sexually Abused: Not on file    Past Medical History, Surgical history, Social history, and Family history were reviewed and updated as appropriate.   Please see review of systems for further details on the patient's review from today.   Objective:   Physical Exam:  There were no vitals  taken for this visit.  Physical Exam Neurological:     Mental Status: She is alert and oriented to person, place, and time.     Cranial Nerves: No dysarthria.  Psychiatric:        Attention and Perception: Attention and perception normal.        Mood and Affect: Mood is anxious. Mood is not depressed. Affect is not angry.        Speech: Speech normal.        Behavior: Behavior is cooperative.        Thought Content: Thought content normal. Thought content is not paranoid or delusional. Thought content does not include homicidal or suicidal ideation. Thought content does not include homicidal or suicidal plan.        Cognition and Memory: Cognition and memory normal.        Judgment: Judgment normal.     Comments: Insight intact Less mood swings lately.     Lab Review:     Component Value Date/Time   NA 137 12/04/2019 1632   K 4.9 12/04/2019 1632   CL 95 (L) 12/04/2019 1632   CO2 27 12/04/2019 1632   GLUCOSE 100 (H) 12/04/2019 1632   GLUCOSE 147 (H) 10/14/2019 0920   BUN 32 (H) 12/04/2019 1632   CREATININE 1.56 (H) 12/04/2019 1632   CREATININE 1.81 (H) 06/17/2018 1231   CALCIUM 10.4 (H) 12/04/2019 1632   PROT 7.1 12/04/2019 1632   ALBUMIN 4.9 (H) 12/04/2019 1632   AST 21 12/04/2019 1632   ALT 23 12/04/2019 1632   ALKPHOS 148 (H) 12/04/2019 1632   BILITOT <0.2 12/04/2019 1632   GFRNONAA 43 (L) 12/04/2019 1632   GFRNONAA 36 (L) 06/17/2018 1231   GFRAA 50 (L) 12/04/2019 1632   GFRAA 42 (L) 06/17/2018 1231       Component Value Date/Time   WBC 7.5 12/04/2019 1632   WBC 7.9 10/14/2019 0920   RBC 4.46 12/04/2019 1632   RBC 4.52 10/14/2019 0920   HGB 13.3 12/04/2019 1632   HCT 39.3 12/04/2019 1632   PLT 316 12/04/2019 1632   MCV 88 12/04/2019 1632   MCH 29.8 12/04/2019 1632   MCH 29.6 10/14/2019 0920   MCHC 33.8 12/04/2019 1632   MCHC 32.0 10/14/2019 0920   RDW 11.7 12/04/2019 1632   LYMPHSABS 2.2 12/04/2019 1632   MONOABS 0.8 10/14/2019 0920   EOSABS 0.3  12/04/2019 1632   BASOSABS 0.1 12/04/2019 1632    No results found for: POCLITH, LITHIUM  No results found for: PHENYTOIN, PHENOBARB, VALPROATE, CBMZ   .res Assessment: Plan:    [name] was seen today for follow-up, depression, other, anxiety and sleeping problem.  Diagnoses and all orders for this visit:  Bipolar 1 disorder, mixed, moderate (HCC)  Generalized anxiety disorder  Panic disorder with agoraphobia  Insomnia due to mental condition  Restless leg syndrome, controlled    Greater than 50% of 30 min non face to face time with patient was spent on counseling and coordination of care. We discussed Patient with a history of multiple med failures and history of psychiatric hospitalization and suicide attempts recent worsening of bipolar depression.  Chronic stressors contribute to mood and anxiety problems.   Concern about potential weight gain with medications.  She has taking the major mood stabilizers with low weight gain potential.  Therefore I am hesitant to switch major mood stabilizers.  Discussed the pros and cons of switching mood stabilizers.  Could consider ultra low-dose lithium.   She recently failed to tolerate VPA.  Is doing better on CBZ but there are drug interaction problems to concern [name] and she's aware.   Continue Latuda 120 mg and lamotrigine 300 mg twice daily for bipolar disorder.  Still some moodiness but it's better than last few mos overall but had some even in Feb.  Overall better back on Equetro.    Discussed the necessary polypharmacy at this time to manage her bipolar symptoms and anxiety.  Discussed drug to drug interaction issues between little to the and carbamazepine.  Carbamazepine can affect blood levels of multiple other medications but at this time it appears unavoidable.  She is still getting benefit out of the various medication she is taking.  Discussed potential metabolic side effects associated with atypical antipsychotics, as well as  potential risk for movement side effects. Advised pt to contact office if movement side effects occur.   Counseled patient regarding potential benefits, risks, and side effects of Lamictal and carbamazepine to include potential risk of Stevens-Johnson syndrome. Advised patient to stop taking Lamictal and contact office immediately if rash develops and to seek urgent medical attention if rash is severe and/or spreading quickly.  Disc risks with polypharmacy but it is medically necessary at this time  Disc safety relatively of trazodone and can increase if needed to 150 mg HS.  Sleep is worse lately.  RLS managed with pramipexole.  No med changes this visit.    Follow-up 30mos  [name], MD, DFAPA   Please see After Visit Summary for patient specific instructions.  Future Appointments  Date Time Provider [place]  12/22/2019 11:00 AM Briscoe Deutscher, DO [facility] None  12/23/2019 10:00 AM Sanjuana Kava, MD [facility] None  12/25/2019  2:45 PM Trula Slade, DPM [facility]  01/19/2020 10:30 AM Malachy Mood, MD [facility] None    No orders of the defined types were placed in this encounter.   -------------------------------

## 2019-12-18 ENCOUNTER — Other Ambulatory Visit: Payer: Self-pay | Admitting: Psychiatry

## 2019-12-18 DIAGNOSIS — F314 Bipolar disorder, current episode depressed, severe, without psychotic features: Secondary | ICD-10-CM

## 2019-12-22 ENCOUNTER — Encounter (INDEPENDENT_AMBULATORY_CARE_PROVIDER_SITE_OTHER): Payer: Self-pay | Admitting: Family Medicine

## 2019-12-22 ENCOUNTER — Other Ambulatory Visit: Payer: Self-pay

## 2019-12-22 ENCOUNTER — Ambulatory Visit (INDEPENDENT_AMBULATORY_CARE_PROVIDER_SITE_OTHER): Payer: Medicare Other | Admitting: Family Medicine

## 2019-12-22 VITALS — BP 125/79 | HR 84 | Temp 98.0°F | Ht 66.0 in | Wt 187.0 lb

## 2019-12-22 DIAGNOSIS — E559 Vitamin D deficiency, unspecified: Secondary | ICD-10-CM

## 2019-12-22 DIAGNOSIS — Z6830 Body mass index (BMI) 30.0-30.9, adult: Secondary | ICD-10-CM | POA: Diagnosis not present

## 2019-12-22 DIAGNOSIS — F319 Bipolar disorder, unspecified: Secondary | ICD-10-CM | POA: Diagnosis not present

## 2019-12-22 DIAGNOSIS — E669 Obesity, unspecified: Secondary | ICD-10-CM

## 2019-12-22 DIAGNOSIS — E109 Type 1 diabetes mellitus without complications: Secondary | ICD-10-CM

## 2019-12-22 DIAGNOSIS — N189 Chronic kidney disease, unspecified: Secondary | ICD-10-CM

## 2019-12-22 MED ORDER — VITAMIN D (ERGOCALCIFEROL) 1.25 MG (50000 UNIT) PO CAPS: 50000.0000 [IU] | ORAL_CAPSULE | ORAL | 0 refills | Status: DC

## 2019-12-22 NOTE — Progress Notes (Signed)
Chief Complaint:   OBESITY [name] is here to discuss her progress with her obesity treatment plan along with follow-up of her obesity related diagnoses. [name] is on keeping a food journal and adhering to recommended goals of 1500 calories and 75 grams of protein and states she is following her eating plan approximately 100% of the time. [name] states she is walking 7 times per week.  Today's visit was #: 3 Starting weight: 199 lbs Starting date: 11/13/2019 Today's weight: 187 lbs Today's date: 12/22/2019 Total lbs lost to date: 12 lbs Total lbs lost since last in-office visit: 2 lbs  Interim History: [name] reports that she went off plan during vacation 1 week ago.  She was active most days.  Subjective:   1. Vitamin D deficiency [name]'s Vitamin D level was 24.5 on 11/13/2019. She is currently taking vit D. She denies nausea, vomiting or muscle weakness.  2. Type 1 diabetes mellitus without complication ([name]) Medications reviewed. Home glucose monitoring: is performed regularly.  [name] is on an insulin pump.  Lab Results  Component Value Date   HGBA1C 8.0 (H) 09/09/2019   HGBA1C 9.0 04/05/2017   HGBA1C 11.2 (H) 11/24/2015   Lab Results  Component Value Date   LDLCALC 68 04/22/2014   CREATININE 1.56 (H) 12/04/2019   3. Chronic kidney disease Lab Results  Component Value Date   CREATININE 1.56 (H) 12/04/2019   CREATININE 1.62 (H) 11/13/2019   CREATININE 1.69 (H) 10/14/2019   Lab Results  Component Value Date   CREATININE 1.56 (H) 12/04/2019   BUN 32 (H) 12/04/2019   NA 137 12/04/2019   K 4.9 12/04/2019   CL 95 (L) 12/04/2019   CO2 27 12/04/2019   4. Bipolar affective disorder, remission status unspecified ([place]) [name] is taking Latuda, Buspar, and Lamictal.  Assessment/Plan:   1. Vitamin D deficiency Low Vitamin D level contributes to fatigue and are associated with obesity, breast, and colon cancer. She agrees to continue to take prescription Vitamin  D @50 ,000 IU every week and will follow-up for routine testing of Vitamin D, at least 2-3 times per year to avoid over-replacement.  Orders - Vitamin D, Ergocalciferol, (DRISDOL) 1.25 MG (50000 UNIT) CAPS capsule; Take 1 capsule (50,000 Units total) by mouth every 7 (seven) days.  Dispense: 4 capsule; Refill: 0  2. Type 1 diabetes mellitus without complication (HCC) Good blood sugar control is important to decrease the likelihood of diabetic complications such as nephropathy, neuropathy, limb loss, blindness, coronary artery disease, and death. Intensive lifestyle modification including diet, exercise and weight loss are the first line of treatment for diabetes.   3. Chronic kidney disease, unspecified CKD stage Lab results and trends reviewed. We discussed several lifestyle modifications today and she will continue to work on diet, exercise and weight loss efforts. Avoid nephrotoxic medications. Orders and follow up as documented in patient record.   Counseling . Chronic kidney disease (CKD) happens when the kidneys are damaged over a long period of time. . Most of the time, this condition does not go away, but it can usually be controlled. Steps must be taken to slow down the kidney damage or to stop it from getting worse. . Intensive lifestyle modifications are the first line treatment for this issue.  Marland Kitchen Avoid buying foods that are: processed, frozen, or prepackaged to avoid excess salt.  4. Bipolar affective disorder, remission status unspecified ([name]) Current treatment plan is effective, no change in therapy. Orders and follow up as documented in  patient record.  5. Class 1 obesity with serious comorbidity and body mass index (BMI) of 30.0 to 30.9 in adult, unspecified obesity type [name] is currently in the action stage of change. As such, her goal is to continue with weight loss efforts. She has agreed to keeping a food journal and adhering to recommended goals of 1500 calories and 75  grams of protein.   Exercise goals: As is.  Behavioral modification strategies: increasing lean protein intake, decreasing simple carbohydrates and increasing water intake.  [name] has agreed to follow-up with our clinic in 2 weeks. She was informed of the importance of frequent follow-up visits to maximize her success with intensive lifestyle modifications for her multiple health conditions.   Objective:   Blood pressure 125/79, pulse 84, temperature 98 F (36.7 C), temperature source Oral, height 5\' 6"  (1.676 m), weight 187 lb (84.8 kg), last menstrual period 12/15/2019, SpO2 100 %. Body mass index is 30.18 kg/m.  General: Cooperative, alert, well developed, in no acute distress. HEENT: Conjunctivae and lids unremarkable. Cardiovascular: Regular rhythm.  Lungs: Normal work of breathing. Neurologic: No focal deficits.   Lab Results  Component Value Date   CREATININE 1.56 (H) 12/04/2019   BUN 32 (H) 12/04/2019   NA 137 12/04/2019   K 4.9 12/04/2019   CL 95 (L) 12/04/2019   CO2 27 12/04/2019   Lab Results  Component Value Date   ALT 23 12/04/2019   AST 21 12/04/2019   ALKPHOS 148 (H) 12/04/2019   BILITOT <0.2 12/04/2019   Lab Results  Component Value Date   HGBA1C 8.0 (H) 09/09/2019   HGBA1C 9.0 04/05/2017   HGBA1C 11.2 (H) 11/24/2015   HGBA1C 13.3 (H) 04/22/2014   HGBA1C 10.7 (H) 10/11/2013   Lab Results  Component Value Date   TSH 0.859 11/13/2019   Lab Results  Component Value Date   CHOL 149 04/22/2014   HDL 71 04/22/2014   LDLCALC 68 04/22/2014   TRIG 49 04/22/2014   CHOLHDL 2.1 04/22/2014   Lab Results  Component Value Date   WBC 7.5 12/04/2019   HGB 13.3 12/04/2019   HCT 39.3 12/04/2019   MCV 88 12/04/2019   PLT 316 12/04/2019   Lab Results  Component Value Date   IRON 41 11/13/2019   TIBC 267 11/13/2019   FERRITIN 77 11/13/2019   Obesity Behavioral Intervention:   Approximately 15 minutes were spent on the discussion below.  ASK: We  discussed the diagnosis of obesity with [name] today and [name] agreed to give [name] permission to discuss obesity behavioral modification therapy today.  ASSESS: [name] has the diagnosis of obesity and her BMI today is 30.2. [name] is in the action stage of change.   ADVISE: [name] was educated on the multiple health risks of obesity as well as the benefit of weight loss to improve her health. She was advised of the need for long term treatment and the importance of lifestyle modifications to improve her current health and to decrease her risk of future health problems.  AGREE: Multiple dietary modification options and treatment options were discussed and Shamiah agreed to follow the recommendations documented in the above note.  ARRANGE: [name] was educated on the importance of frequent visits to treat obesity as outlined per CMS and USPSTF guidelines and agreed to schedule her next follow up appointment today.  Attestation Statements:   Reviewed by clinician on day of visit: allergies, medications, problem list, medical history, surgical history, family history, social history, and previous encounter  notes.  I, Water quality scientist, CMA, am acting as Location manager for [employer], DO.  I have reviewed the above documentation for accuracy and completeness, and I agree with the above. Briscoe Deutscher, DO

## 2019-12-23 ENCOUNTER — Encounter: Payer: Self-pay | Admitting: Orthopaedic Surgery

## 2019-12-23 ENCOUNTER — Ambulatory Visit (INDEPENDENT_AMBULATORY_CARE_PROVIDER_SITE_OTHER): Payer: Medicare Other | Admitting: Orthopaedic Surgery

## 2019-12-23 DIAGNOSIS — E1040 Type 1 diabetes mellitus with diabetic neuropathy, unspecified: Secondary | ICD-10-CM | POA: Diagnosis not present

## 2019-12-23 DIAGNOSIS — N1831 Chronic kidney disease, stage 3a: Secondary | ICD-10-CM | POA: Diagnosis not present

## 2019-12-23 DIAGNOSIS — E1022 Type 1 diabetes mellitus with diabetic chronic kidney disease: Secondary | ICD-10-CM | POA: Diagnosis not present

## 2019-12-23 DIAGNOSIS — Z794 Long term (current) use of insulin: Secondary | ICD-10-CM | POA: Diagnosis not present

## 2019-12-23 DIAGNOSIS — I10 Essential (primary) hypertension: Secondary | ICD-10-CM | POA: Diagnosis not present

## 2019-12-23 DIAGNOSIS — M7062 Trochanteric bursitis, left hip: Secondary | ICD-10-CM

## 2019-12-23 NOTE — Progress Notes (Signed)
PROCEDURE NOTE:  The patient request injection, verbal consent was obtained.  The left trochanteric area of the hip was prepped appropriately after time out was performed.   Sterile technique was observed and injection of 1 cc of Depo-Medrol 40 mg with several cc's of plain xylocaine. Anesthesia was provided by ethyl chloride and a 20-gauge needle was used to inject the hip area. The injection was tolerated well.  A band aid dressing was applied.  The patient was advised to apply ice later today and tomorrow to the injection sight as needed.  See in one month.  Call if any problem.  Precautions discussed.   Electronically Signed Sanjuana Kava, MD 3/16/202110:24 AM

## 2019-12-25 ENCOUNTER — Ambulatory Visit: Payer: Self-pay | Admitting: Podiatry

## 2019-12-25 ENCOUNTER — Encounter (INDEPENDENT_AMBULATORY_CARE_PROVIDER_SITE_OTHER): Payer: Self-pay | Admitting: Family Medicine

## 2019-12-25 NOTE — Telephone Encounter (Signed)
Please advise. Thank you

## 2019-12-25 NOTE — Telephone Encounter (Signed)
Per Leda Gauze, pt can go ahead and get drawed for [facility]. Called pt to let her know and to add her to the lab schedule at her earliest convenience, no answer, [place].

## 2020-01-02 ENCOUNTER — Encounter (INDEPENDENT_AMBULATORY_CARE_PROVIDER_SITE_OTHER): Payer: Self-pay | Admitting: Family Medicine

## 2020-01-03 ENCOUNTER — Telehealth: Payer: Self-pay | Admitting: Psychiatry

## 2020-01-03 DIAGNOSIS — F4001 Agoraphobia with panic disorder: Secondary | ICD-10-CM

## 2020-01-03 DIAGNOSIS — F430 Acute stress reaction: Secondary | ICD-10-CM

## 2020-01-03 MED ORDER — CLONAZEPAM 0.5 MG PO TABS: 0.5000 mg | ORAL_TABLET | Freq: Three times a day (TID) | ORAL | 0 refills | Status: DC | PRN

## 2020-01-03 MED ORDER — PROPRANOLOL HCL 20 MG PO TABS: ORAL_TABLET | ORAL | 1 refills | Status: DC

## 2020-01-03 NOTE — Telephone Encounter (Signed)
Father died suddenly 2 days ago from MI while visiting aunt who is ill in [place] state. Pt was on the phone with her father when he had the MI and she was trying to scream through the phone to her mother to get help. Mother has to stay in [place] state until cremation and pt reports that she has not been able to be with her mother. The day after father's death was 2 year anniversary of brother's death. She reports that her anxiety has been severe today with panic attacks. Has had more migraines and increase in emotional eating.  Reports that she used to be on Klonopin. Reports Gabapentin is causing drowsiness and has not been effective for anxiety.   Will start Klonopin q 8 hours prn #20.  Will start Propranolol 20 mg 1/2-1 tab po TID anxiety.   Patient advised to contact office with any questions, adverse effects, or acute worsening in signs and symptoms.

## 2020-01-04 DIAGNOSIS — E109 Type 1 diabetes mellitus without complications: Secondary | ICD-10-CM | POA: Diagnosis not present

## 2020-01-07 ENCOUNTER — Ambulatory Visit (INDEPENDENT_AMBULATORY_CARE_PROVIDER_SITE_OTHER): Payer: Medicare Other | Admitting: Nurse Practitioner

## 2020-01-07 ENCOUNTER — Other Ambulatory Visit: Payer: Self-pay

## 2020-01-07 VITALS — BP 118/86 | HR 98 | Temp 97.9°F | Resp 18 | Ht 66.0 in | Wt 187.0 lb

## 2020-01-07 DIAGNOSIS — R109 Unspecified abdominal pain: Secondary | ICD-10-CM | POA: Diagnosis not present

## 2020-01-07 DIAGNOSIS — R399 Unspecified symptoms and signs involving the genitourinary system: Secondary | ICD-10-CM | POA: Diagnosis not present

## 2020-01-07 DIAGNOSIS — B9689 Other specified bacterial agents as the cause of diseases classified elsewhere: Secondary | ICD-10-CM | POA: Diagnosis not present

## 2020-01-07 DIAGNOSIS — N760 Acute vaginitis: Secondary | ICD-10-CM

## 2020-01-07 LAB — URINALYSIS, ROUTINE W REFLEX MICROSCOPIC
Bilirubin Urine: NEGATIVE
Glucose, UA: NEGATIVE
Hgb urine dipstick: NEGATIVE
Ketones, ur: NEGATIVE
Leukocytes,Ua: NEGATIVE
Nitrite: NEGATIVE
Protein, ur: NEGATIVE
Specific Gravity, Urine: 1.01 (ref 1.001–1.03)
pH: 5.5 (ref 5.0–8.0)

## 2020-01-07 LAB — PREGNANCY, URINE: Preg Test, Ur: NEGATIVE

## 2020-01-07 MED ORDER — METRONIDAZOLE 500 MG PO TABS: 500.0000 mg | ORAL_TABLET | Freq: Two times a day (BID) | ORAL | 0 refills | Status: AC

## 2020-01-07 NOTE — Progress Notes (Signed)
Acute Office Visit  Subjective:    Patient ID: [name], female    DOB: 02/17/1986, 34 y.o.   MRN: [id number]  Chief Complaint  Patient presents with  . Urinary Tract Infection    back pain, feeling like bladder won't empty, started x3-4 days, no meds    HPI Patient is a 34 year old female presenting for c/o pelvic discomfort. H/o BV. She has noticed discharge and odor increased over 2 days. She declined pelvic examination today. Discussed risk vs benefit of treatment and she desires to proceed. She did have a new IUD placed 2 months ago and has GYN f/u in 2 weeks. No fever/chills, CVA tenderness or pain, no urgency, hesitancy, burning when urinating. No cp/ct, other gu/gi sxs, other pain, sob, edema, or recent injury. Denied being pregnant although periods off since IUD placement.  Past Medical History:  Diagnosis Date  . Anemia   . Anxiety   . Asthma   . Asthma 11/10/2013  . Back pain   . Bipolar 1 disorder ([place])   . Chest pain   . Chronic renal failure syndrome, stage 3 (moderate)   . Complication of anesthesia   . Constipation   . Depression   . Depression   . Diabetic gastroparesis ([place])   . Diabetic neuropathy, type I diabetes mellitus ([place])   . Edema, lower extremity   . Gallbladder disease   . Gastroparesis   . GERD (gastroesophageal reflux disease)   . Headache(784.0)   . Hypertension   . Joint pain   . Kidney stones   . Palpitations   . Polyneuropathy in diabetes(357.2)   . PONV (postoperative nausea and vomiting)   . 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 Shortness of breath   . Tachycardia    baseline tachycardia   . 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 ANAL RECTAL MANOMETRY N/A 03/25/2018   Procedure: ANO RECTAL MANOMETRY;  Surgeon: Doran Stabler, MD;  Location: [facility];  Service: Gastroenterology;  Laterality: N/A;  . CARPAL  TUNNEL RELEASE Left 10/16/2019   Procedure: LEFT CARPAL TUNNEL RELEASE;  Surgeon: Carole Civil, MD;  Location: [facility];  Service: Orthopedics;  Laterality: Left;  . CESAREAN SECTION     x 2  . CHOLECYSTECTOMY    . EYE SURGERY    . REFRACTIVE SURGERY Bilateral     Family History  Problem Relation Age of Onset  . Hypertension Other   . Diabetes Mother        type 2  . Hypertension Mother   . Obesity Mother   . Diabetes Brother        type 1  . Renal Disease Brother        renal failure  . Hypertension Brother   . Diabetes Father   . Hypertension Father   . High Cholesterol Father   . Sleep apnea Father   . Obesity Father   . Breast cancer Maternal Aunt 64  . Colon cancer Maternal Aunt   . Uterine cancer Maternal Aunt 65       same as breast cancer  . Uterine cancer Maternal Aunt 61  . Rectal cancer Neg Hx   . Esophageal cancer Neg Hx     Social History   Socioeconomic History  . Marital status: Legally Separated    Spouse name: Not on file  .  Number of children: 2  . Years of education: Not on file  . Highest education level: Not on file  Occupational History  . Occupation: Home maker  Tobacco Use  . Smoking status: Never Smoker  . Smokeless tobacco: Never Used  Substance and Sexual Activity  . Alcohol use: No  . Drug use: No  . Sexual activity: Yes    Partners: Male    Birth control/protection: Implant  Other Topics Concern  . Not on file  Social History Narrative   Pt has a daughter and boyfriend.          Social Determinants of Health   Financial Resource Strain:   . Difficulty of Paying Living Expenses:   Food Insecurity:   . Worried About Charity fundraiser in the Last Year:   . Arboriculturist in the Last Year:   Transportation Needs:   . Film/video editor (Medical):   Marland Kitchen Lack of Transportation (Non-Medical):   Physical Activity:   . Days of Exercise per Week:   . Minutes of Exercise per Session:   Stress:   . Feeling of Stress :    Social Connections:   . Frequency of Communication with Friends and Family:   . Frequency of Social Gatherings with Friends and Family:   . Attends Religious Services:   . Active Member of Clubs or Organizations:   . Attends Archivist Meetings:   Marland Kitchen Marital Status:   Intimate Partner Violence:   . Fear of Current or Ex-Partner:   . Emotionally Abused:   Marland Kitchen Physically Abused:   . Sexually Abused:     Outpatient Medications Prior to Visit  Medication Sig Dispense Refill  . acetaminophen (TYLENOL) 500 MG tablet Take 1,000 mg by mouth every 6 (six) hours as needed for mild pain or moderate pain.     Marland Kitchen albuterol (PROVENTIL) (2.5 MG/3ML) 0.083% nebulizer solution Take 3 mLs (2.5 mg total) by nebulization every 6 (six) hours as needed for wheezing or shortness of breath. 150 mL 1  . albuterol (VENTOLIN HFA) 108 (90 Base) MCG/ACT inhaler INHALE 2 PUFFS BY MOUTH EVERY 6 HOURS AS NEEDED FOR SHORTNESS OF BREATH (Patient taking differently: Inhale 2 puffs into the lungs every 6 (six) hours as needed for wheezing or shortness of breath. ) 8.5 g 3  . AMBULATORY NON FORMULARY MEDICATION DOMPERIDONE 10 mg, twice a day. One with breakfast One with supper Dispense 60 tablets  2 refills 60 tablet 2  . busPIRone (BUSPAR) 15 MG tablet TAKE 2 TABLETS (30 MG TOTAL) BY MOUTH 2 (TWO) TIMES DAILY. 120 tablet 1  . carbamazepine (EQUETRO) 200 MG CP12 12 hr capsule Take 2 capsules (400 mg) by mouth in the morning & take 3 capsules (600 mg) by mouth at bedtime. 150 capsule 2  . ciclopirox (PENLAC) 8 % solution Apply one coat to each toenail daily. Remove weekly with polish remover. 6.6 mL 11  . clonazePAM (KLONOPIN) 0.5 MG tablet Take 1 tablet (0.5 mg total) by mouth every 8 (eight) hours as needed for anxiety. 20 tablet 0  . EMGALITY 120 MG/ML SOSY Inject into the skin every 30 (thirty) days.     . enalapril (VASOTEC) 20 MG tablet Take 20 mg by mouth daily.     . fluticasone (FLONASE) 50 MCG/ACT nasal  spray Place 2 sprays into both nostrils daily. (Patient taking differently: Place 2 sprays into both nostrils daily as needed (seasonal allergies.). ) 16 g 2  .  furosemide (LASIX) 40 MG tablet Take 40 mg by mouth every other day.     . gabapentin (NEURONTIN) 600 MG tablet Take 1 tablet (600 mg total) by mouth 3 (three) times daily. 90 tablet 2  . GLUCAGON EMERGENCY 1 MG injection Inject 1 mg into the skin as needed (hypoglycemia.).     . Insulin Human (INSULIN PUMP) SOLN Inject 1 each into the skin 3 times daily with meals, bedtime and 2 AM. Novolog: 36-38 units per day    . lamoTRIgine (LAMICTAL) 150 MG tablet TAKE 2 TABLETS (300 MG TOTAL) BY MOUTH 2 (TWO) TIMES DAILY. 120 tablet 2  . LATUDA 120 MG TABS TAKE 1 TABLET BY MOUTH EVERY DAY 30 tablet 1  . mometasone-formoterol (DULERA) 100-5 MCG/ACT AERO Inhale 2 puffs into the lungs 2 (two) times daily. 1 Inhaler 5  . montelukast (SINGULAIR) 10 MG tablet Take 1 tablet (10 mg total) by mouth at bedtime. 90 tablet 3  . NOVOLOG 100 UNIT/ML injection Inject 36-38 Units into the skin daily. Via insulin pump per sliding scale    . pantoprazole (PROTONIX) 20 MG tablet Take 1 tablet (20 mg total) by mouth daily before breakfast. Take at least 30 minutes before breakfast 30 tablet 3  . Pramipexole Dihydrochloride 0.75 MG TB24 TAKE 1 TABLET BY MOUTH EVERY DAY IN THE EVENING 30 tablet 1  . promethazine (PHENERGAN) 25 MG tablet Take 0.5 tablets (12.5 mg total) by mouth every 8 (eight) hours as needed for nausea or vomiting. (Patient taking differently: Take 12.5-25 mg by mouth every 8 (eight) hours as needed for nausea or vomiting. ) 30 tablet 1  . propranolol (INDERAL) 20 MG tablet Take 1/2-1 tabs po TID for anxiety 90 tablet 1  . Prucalopride Succinate (MOTEGRITY) 2 MG TABS Take 1 tablet (2 mg total) by mouth every morning. 30 tablet 1  . SUMAtriptan Succinate 3 MG/0.5ML SOAJ Inject into the skin. To use as needed for migraines    . tiZANidine (ZANAFLEX) 4 MG  tablet Take 4 mg by mouth 2 (two) times daily as needed.    . traZODone (DESYREL) 50 MG tablet Take 1 tablet (50 mg total) by mouth at bedtime. (Patient taking differently: Take 50 mg by mouth at bedtime as needed for sleep. ) 30 tablet 2  . Ubrogepant (UBRELVY) 100 MG TABS Take 100 mg by mouth daily as needed (migraine headaches.).     Marland Kitchen Urea 40 % LOTN Apply 1 application topically daily. Apply only to calluses 325 mL 0  . Vitamin D, Ergocalciferol, (DRISDOL) 1.25 MG (50000 UNIT) CAPS capsule Take 1 capsule (50,000 Units total) by mouth every 7 (seven) days. 4 capsule 0  . HYDROcodone-acetaminophen (NORCO/VICODIN) 5-325 MG tablet One tablet every four hours as needed for acute pain.  Limit of five days per [place] statue. 30 tablet 0  . etonogestrel (NEXPLANON) 68 MG IMPL implant 68 mg by Subdermal route once.      No facility-administered medications prior to visit.    Allergies  Allergen Reactions  . Toradol [Ketorolac Tromethamine]     Cannot take d/t kidney issues   . Ceftin Rash    Review of Systems  All other systems reviewed and are negative.      Objective:    Physical Exam Vitals and nursing note reviewed.  Constitutional:      Appearance: Normal appearance.  HENT:     Head: Normocephalic.     Nose: Nose normal.     Mouth/Throat:  Mouth: Mucous membranes are moist.     Pharynx: Oropharynx is clear.  Eyes:     Extraocular Movements: Extraocular movements intact.     Conjunctiva/sclera: Conjunctivae normal.     Pupils: Pupils are equal, round, and reactive to light.  Cardiovascular:     Rate and Rhythm: Normal rate and regular rhythm.  Pulmonary:     Effort: Pulmonary effort is normal.     Breath sounds: Normal breath sounds.  Abdominal:     General: Abdomen is flat. Bowel sounds are normal. There is no distension or abdominal bruit.     Palpations: Abdomen is soft. There is no fluid wave, hepatomegaly, splenomegaly or mass.     Tenderness: There is no  abdominal tenderness. There is no right CVA tenderness, left CVA tenderness, guarding or rebound. Negative signs include Murphy's sign and McBurney's sign.     Hernia: No hernia is present. There is no hernia in the umbilical area.  Musculoskeletal:        General: Normal range of motion.     Cervical back: Normal range of motion and neck supple.  Skin:    General: Skin is warm and dry.     Capillary Refill: Capillary refill takes less than 2 seconds.  Neurological:     General: No focal deficit present.     Mental Status: She is alert and oriented to person, place, and time.  Psychiatric:        Mood and Affect: Mood normal.        Behavior: Behavior normal.        Thought Content: Thought content normal.     BP 118/86 (BP Location: Left Arm, Patient Position: Sitting, Cuff Size: Normal)   Pulse 98   Temp 97.9 F (36.6 C) (Temporal)   Resp 18   Ht 5\' 6"  (1.676 m)   Wt 187 lb (84.8 kg)   LMP 12/15/2019 (Exact Date)   SpO2 100%   BMI 30.18 kg/m  Wt Readings from Last 3 Encounters:  01/07/20 187 lb (84.8 kg)  12/22/19 187 lb (84.8 kg)  12/05/19 195 lb (88.5 kg)    Health Maintenance Due  Topic Date Due  . FOOT EXAM  12/28/2017    There are no preventive care reminders to display for this patient.   Lab Results  Component Value Date   TSH 0.859 11/13/2019   Lab Results  Component Value Date   WBC 7.5 12/04/2019   HGB 13.3 12/04/2019   HCT 39.3 12/04/2019   MCV 88 12/04/2019   PLT 316 12/04/2019   Lab Results  Component Value Date   NA 137 12/04/2019   K 4.9 12/04/2019   CO2 27 12/04/2019   GLUCOSE 100 (H) 12/04/2019   BUN 32 (H) 12/04/2019   CREATININE 1.56 (H) 12/04/2019   BILITOT <0.2 12/04/2019   ALKPHOS 148 (H) 12/04/2019   AST 21 12/04/2019   ALT 23 12/04/2019   PROT 7.1 12/04/2019   ALBUMIN 4.9 (H) 12/04/2019   CALCIUM 10.4 (H) 12/04/2019   ANIONGAP 9 10/14/2019   Lab Results  Component Value Date   CHOL 149 04/22/2014   Lab Results    Component Value Date   HDL 71 04/22/2014   Lab Results  Component Value Date   LDLCALC 68 04/22/2014   Lab Results  Component Value Date   TRIG 49 04/22/2014   Lab Results  Component Value Date   CHOLHDL 2.1 04/22/2014   Lab Results  Component Value Date  HGBA1C 8.0 (H) 09/09/2019      Assessment & Plan:  Your sxs are consistent with Bacterial Vaginitis: RX Flagyl twice a day for 7 days Follow Up as planned with your GYN for the appt in 2 weeks IUD s/p 2 month placement check.   Pregnancy and U/A negative.   Pt will start treatment for BV and seek medical attention for non resolving or worsening symptoms.  Problem List Items Addressed This Visit    None    Visit Diagnoses    BV (bacterial vaginosis)    -  Primary   Relevant Medications   metroNIDAZOLE (FLAGYL) 500 MG tablet   UTI symptoms       Relevant Orders   Pregnancy, urine (Completed)   Urinalysis, Routine w reflex microscopic (Completed)   Abdominal pain, unspecified abdominal location       Relevant Orders   Pregnancy, urine (Completed)   Urinalysis, Routine w reflex microscopic (Completed)      Meds ordered this encounter  Medications  . metroNIDAZOLE (FLAGYL) 500 MG tablet    Sig: Take 1 tablet (500 mg total) by mouth 2 (two) times daily for 7 days.    Dispense:  14 tablet    Refill:  0   Follow Up: as needed for worsening or non resolving symptoms.  Annie Main, FNP

## 2020-01-12 ENCOUNTER — Ambulatory Visit (INDEPENDENT_AMBULATORY_CARE_PROVIDER_SITE_OTHER): Payer: Medicare Other | Admitting: Family Medicine

## 2020-01-14 DIAGNOSIS — Z9889 Other specified postprocedural states: Secondary | ICD-10-CM | POA: Diagnosis not present

## 2020-01-14 DIAGNOSIS — H2701 Aphakia, right eye: Secondary | ICD-10-CM | POA: Diagnosis not present

## 2020-01-14 DIAGNOSIS — H50111 Monocular exotropia, right eye: Secondary | ICD-10-CM | POA: Diagnosis not present

## 2020-01-17 ENCOUNTER — Other Ambulatory Visit: Payer: Self-pay | Admitting: Psychiatry

## 2020-01-17 DIAGNOSIS — F4001 Agoraphobia with panic disorder: Secondary | ICD-10-CM

## 2020-01-17 DIAGNOSIS — F430 Acute stress reaction: Secondary | ICD-10-CM

## 2020-01-19 ENCOUNTER — Other Ambulatory Visit: Payer: Self-pay

## 2020-01-19 ENCOUNTER — Encounter: Payer: Self-pay | Admitting: Obstetrics and Gynecology

## 2020-01-19 ENCOUNTER — Ambulatory Visit (INDEPENDENT_AMBULATORY_CARE_PROVIDER_SITE_OTHER): Payer: Medicare HMO | Admitting: Obstetrics and Gynecology

## 2020-01-19 ENCOUNTER — Ambulatory Visit: Payer: Medicare Other | Admitting: Podiatry

## 2020-01-19 VITALS — BP 134/90 | HR 93 | Ht 66.0 in

## 2020-01-19 DIAGNOSIS — Z30431 Encounter for routine checking of intrauterine contraceptive device: Secondary | ICD-10-CM | POA: Diagnosis not present

## 2020-01-19 DIAGNOSIS — R102 Pelvic and perineal pain: Secondary | ICD-10-CM | POA: Diagnosis not present

## 2020-01-19 NOTE — Telephone Encounter (Signed)
Will defer to Dr. Clovis Pu. Script was sent in wen pt called after hours due to acute anxiety and grief after the sudden death of her father.

## 2020-01-19 NOTE — Telephone Encounter (Signed)
RX per phone message 03/27, refill?

## 2020-01-19 NOTE — Telephone Encounter (Signed)
This was sent when Janett Billow was on call, see phone message.

## 2020-01-19 NOTE — Progress Notes (Signed)
Obstetrics & Gynecology Office Visit   Chief Complaint:  Chief Complaint  Patient presents with  . IUD check    History of Present Illness: 34 y.o. patient presenting for follow up of Mirena IUD placement 6+ weeks ago.  The indication for her IUD was cycle control.  She denies any complications since her IUD placement.  Still having some occasional spotting.  is not able to feel strings.    Did note an episode of sharp intermittent pelvic pain a few weeks ago.  Was treated for BV by PCP.  Is worried that is may be related to her ovaries  Review of Systems: Review of Systems  Constitutional: Negative.   Gastrointestinal: Positive for abdominal pain and constipation. Negative for blood in stool, diarrhea, heartburn, melena, nausea and vomiting.  Genitourinary: Negative.     Past Medical History:  Past Medical History:  Diagnosis Date  . Anemia   . Anxiety   . Asthma   . Asthma 11/10/2013  . Back pain   . Bipolar 1 disorder ([place])   . Chest pain   . Chronic renal failure syndrome, stage 3 (moderate)   . Complication of anesthesia   . Constipation   . Depression   . Depression   . Diabetic gastroparesis ([place])   . Diabetic neuropathy, type I diabetes mellitus ([place])   . Edema, lower extremity   . Gallbladder disease   . Gastroparesis   . GERD (gastroesophageal reflux disease)   . Headache(784.0)   . Hypertension   . Joint pain   . Kidney stones   . Palpitations   . Polyneuropathy in diabetes(357.2)   . PONV (postoperative nausea and vomiting)   . 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 Shortness of breath   . Tachycardia    baseline tachycardia   . 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 ANAL RECTAL MANOMETRY N/A 03/25/2018   Procedure: ANO RECTAL MANOMETRY;  Surgeon: Doran Stabler, MD;  [facility]: [facility];  Service:  Gastroenterology;  Laterality: N/A;  . CARPAL TUNNEL RELEASE Left 10/16/2019   Procedure: LEFT CARPAL TUNNEL RELEASE;  Surgeon: Carole Civil, MD;  Location: [facility];  Service: Orthopedics;  Laterality: Left;  . CESAREAN SECTION     x 2  . CHOLECYSTECTOMY    . EYE SURGERY    . REFRACTIVE SURGERY Bilateral     Gynecologic History: No LMP recorded. (Menstrual status: IUD).  Obstetric History: G2P1  Family History:  Family History  Problem Relation Age of Onset  . Hypertension Other   . Diabetes Mother        type 2  . Hypertension Mother   . Obesity Mother   . Diabetes Brother        type 1  . Renal Disease Brother        renal failure  . Hypertension Brother   . Diabetes Father   . Hypertension Father   . High Cholesterol Father   . Sleep apnea Father   . Obesity Father   . Heart disease Father   . Breast cancer Maternal Aunt 64  . Colon cancer Maternal Aunt   . Uterine cancer Maternal Aunt 65       same as breast cancer  . Uterine cancer Maternal Aunt 61  . Rectal cancer Neg Hx   . Esophageal cancer  Neg Hx     Social History:  Social History   Socioeconomic History  . Marital status: Legally Separated    Spouse name: Not on file  . Number of children: 2  . Years of education: Not on file  . Highest education level: Not on file  Occupational History  . Occupation: Home maker  Tobacco Use  . Smoking status: Never Smoker  . Smokeless tobacco: Never Used  Substance and Sexual Activity  . Alcohol use: No  . Drug use: No  . Sexual activity: Yes    Partners: Male    Birth control/protection: I.U.D.  Other Topics Concern  . Not on file  Social History Narrative   Pt has a daughter and boyfriend.    Dad passed away suddenly heart attack 12/2019   Social Determinants of Health   Financial Resource Strain:   . Difficulty of Paying Living Expenses:   Food Insecurity:   . Worried About Charity fundraiser in the Last Year:   . Arboriculturist in the Last  Year:   Transportation Needs:   . Film/video editor (Medical):   Marland Kitchen Lack of Transportation (Non-Medical):   Physical Activity:   . Days of Exercise per Week:   . Minutes of Exercise per Session:   Stress:   . Feeling of Stress :   Social Connections:   . Frequency of Communication with Friends and Family:   . Frequency of Social Gatherings with Friends and Family:   . Attends Religious Services:   . Active Member of Clubs or Organizations:   . Attends Archivist Meetings:   Marland Kitchen Marital Status:   Intimate Partner Violence:   . Fear of Current or Ex-Partner:   . Emotionally Abused:   Marland Kitchen Physically Abused:   . Sexually Abused:     Allergies:  Allergies  Allergen Reactions  . Toradol [Ketorolac Tromethamine]     Cannot take d/t kidney issues   . Ceftin Rash    Medications: Prior to Admission medications   Medication Sig Start Date End Date Taking? Authorizing Provider  acetaminophen (TYLENOL) 500 MG tablet Take 1,000 mg by mouth every 6 (six) hours as needed for mild pain or moderate pain.    Yes [provider]  albuterol (PROVENTIL) (2.5 MG/3ML) 0.083% nebulizer solution Take 3 mLs (2.5 mg total) by nebulization every 6 (six) hours as needed for wheezing or shortness of breath. 11/12/18  Yes Delsa Grana, PA-C  albuterol (VENTOLIN HFA) 108 (90 Base) MCG/ACT inhaler INHALE 2 PUFFS BY MOUTH EVERY 6 HOURS AS NEEDED FOR SHORTNESS OF BREATH Patient taking differently: Inhale 2 puffs into the lungs every 6 (six) hours as needed for wheezing or shortness of breath.  09/15/19  Yes [name], MD  AMBULATORY NON FORMULARY MEDICATION DOMPERIDONE 10 mg, twice a day. One with breakfast One with supper Dispense 60 tablets  2 refills 04/16/19  Yes Danis, Estill Cotta III, MD  busPIRone (BUSPAR) 15 MG tablet TAKE 2 TABLETS (30 MG TOTAL) BY MOUTH 2 (TWO) TIMES DAILY. 12/18/19  Yes Cottle, Billey Co., MD  carbamazepine (EQUETRO) 200 MG CP12 12 hr capsule Take 2 capsules  (400 mg) by mouth in the morning & take 3 capsules (600 mg) by mouth at bedtime. 12/18/19  Yes Cottle, Billey Co., MD  ciclopirox [facility]) 8 % solution Apply one coat to each toenail daily. Remove weekly with polish remover. 11/04/18  Yes Marzetta Board, DPM  clonazePAM [facility])  0.5 MG tablet Take 1 tablet (0.5 mg total) by mouth every 8 (eight) hours as needed for anxiety. 01/03/20  Yes Thayer Headings, PMHNP  EMGALITY 120 MG/ML SOSY Inject into the skin every 30 (thirty) days.  08/05/19  Yes [provider]  enalapril (VASOTEC) 20 MG tablet Take 20 mg by mouth daily.    Yes [provider]  fluticasone (FLONASE) 50 MCG/ACT nasal spray Place 2 sprays into both nostrils daily. Patient taking differently: Place 2 sprays into both nostrils daily as needed (seasonal allergies.).  11/12/18  Yes Delsa Grana, PA-C  furosemide (LASIX) 40 MG tablet Take 40 mg by mouth every other day.    Yes [provider]  gabapentin (NEURONTIN) 600 MG tablet Take 1 tablet (600 mg total) by mouth 3 (three) times daily. 11/19/19  Yes Cottle, Billey Co., MD  GLUCAGON EMERGENCY 1 MG injection Inject 1 mg into the skin as needed (hypoglycemia.).  04/30/19  Yes [provider]  Insulin Human (INSULIN PUMP) SOLN Inject 1 each into the skin 3 times daily with meals, bedtime and 2 AM. Novolog: 36-38 units per day   Yes [provider]  lamoTRIgine (LAMICTAL) 150 MG tablet TAKE 2 TABLETS (300 MG TOTAL) BY MOUTH 2 (TWO) TIMES DAILY. 12/18/19  Yes Cottle, Billey Co., MD  LATUDA 120 MG TABS TAKE 1 TABLET BY MOUTH EVERY DAY 12/18/19  Yes Cottle, Billey Co., MD  mometasone-formoterol [facility]) 100-5 MCG/ACT AERO Inhale 2 puffs into the lungs 2 (two) times daily. 03/15/18  Yes Delsa Grana, PA-C  montelukast (SINGULAIR) 10 MG tablet Take 1 tablet (10 mg total) by mouth at bedtime. 10/25/18  Yes [name], MD  NOVOLOG 100 UNIT/ML injection Inject 36-38 Units into the skin daily. Via  insulin pump per sliding scale 10/04/18  Yes [provider]  pantoprazole (PROTONIX) 20 MG tablet Take 1 tablet (20 mg total) by mouth daily before breakfast. Take at least 30 minutes before breakfast 08/15/19  Yes Willia Craze, NP  Pramipexole Dihydrochloride 0.75 MG TB24 TAKE 1 TABLET BY MOUTH EVERY DAY IN THE EVENING 12/18/19  Yes Cottle, Billey Co., MD  promethazine (PHENERGAN) 25 MG tablet Take 0.5 tablets (12.5 mg total) by mouth every 8 (eight) hours as needed for nausea or vomiting. Patient taking differently: Take 12.5-25 mg by mouth every 8 (eight) hours as needed for nausea or vomiting.  04/15/19  Yes Doran Stabler, MD  propranolol (INDERAL) 20 MG tablet Take 1/2-1 tabs po TID for anxiety 01/03/20  Yes Thayer Headings, PMHNP  Prucalopride Succinate (MOTEGRITY) 2 MG TABS Take 1 tablet (2 mg total) by mouth every morning. 08/06/19  Yes Willia Craze, NP  SUMAtriptan Succinate 3 MG/0.5ML SOAJ Inject into the skin. To use as needed for migraines   Yes [provider]  tiZANidine (ZANAFLEX) 4 MG tablet Take 4 mg by mouth 2 (two) times daily as needed. 12/07/19  Yes [provider]  traZODone (DESYREL) 50 MG tablet Take 1 tablet (50 mg total) by mouth at bedtime. Patient taking differently: Take 50 mg by mouth at bedtime as needed for sleep.  12/03/18  Yes Cottle, Billey Co., MD  Ubrogepant (UBRELVY) 100 MG TABS Take 100 mg by mouth daily as needed (migraine headaches.).    Yes [provider]  Urea 40 % LOTN Apply 1 application topically daily. Apply only to calluses 06/02/19  Yes Trula Slade, DPM  Vitamin D, Ergocalciferol, (DRISDOL) 1.25 MG (  50000 UNIT) CAPS capsule Take 1 capsule (50,000 Units total) by mouth every 7 (seven) days. 12/22/19  Yes Briscoe Deutscher, DO  etonogestrel (NEXPLANON) 68 MG IMPL implant 68 mg by Subdermal route once.     [provider]    Physical Exam Blood pressure 134/90, pulse 93, height 5\' 6"  (1.676  m). No LMP recorded. (Menstrual status: IUD).  General: NAD HEENT: normocephalic, anicteric Pulmonary: No increased work of breathing  Genitourinary:  External: Normal external female genitalia.  Normal urethral meatus, normal  Bartholin's and Skene's glands.    Vagina: Normal vaginal mucosa, no evidence of prolapse.    Cervix: Grossly normal in appearance, no bleeding, IUD strings visualized 1cm  Uterus: Non-enlarged, mobile, normal contour.  No CMT  Adnexa: ovaries non-enlarged, no adnexal masses  Rectal: deferred  Lymphatic: no evidence of inguinal lymphadenopathy Extremities: no edema, erythema, or tenderness Neurologic: Grossly intact Psychiatric: mood appropriate, affect full  Female chaperone present for pelvic and breast  portions of the physical exam  Assessment: 34 y.o. G2P1 IUD check up pelvic pain  Plan: Problem List Items Addressed This Visit    None    Visit Diagnoses    Pelvic pain in female    -  Primary   Relevant Orders   US Transvaginal Non-OB   IUD check up       Relevant Orders   US Transvaginal Non-OB      1.  The patient was given instructions to check her IUD strings monthly and call with any problems or concerns.  She should call for fevers, chills, abnormal vaginal discharge, pelvic pain, or other complaints.  2.   IUDs while effective at preventing pregnancy do not prevent transmission of sexually transmitted diseases and use of barrier methods for this purpose was discussed.  Low overall incidence of failure with 99.7% efficacy rate in typical use.  The patient has not contraindication to IUD placement.  3. Pelvic pain - may be [place] but ovarian cyst or cyst rupture also in differential.  Will obtain TVUS   4) A total of 15 minutes were spent in face-to-face contact with the patient during this encounter with over half of that time devoted to counseling and coordination of care.  5) Return in about 2 weeks (around 02/02/2020) for 1-2  week TVUS pelvic pain.   Malachy Mood, MD, [facility], [facility] 01/19/2020, 11:21 AM

## 2020-01-20 ENCOUNTER — Ambulatory Visit: Payer: Medicare Other | Admitting: Orthopaedic Surgery

## 2020-01-20 ENCOUNTER — Telehealth: Payer: Self-pay

## 2020-01-20 NOTE — Telephone Encounter (Signed)
Prior authorization submitted fro EQUETRO 200 MG CAPSULES #150/30 day, approved effective 01/08/2020-10/08/2020 with [facility] ID# [id number]   Submitted through cover my meds

## 2020-01-22 DIAGNOSIS — G43709 Chronic migraine without aura, not intractable, without status migrainosus: Secondary | ICD-10-CM | POA: Insufficient documentation

## 2020-01-22 DIAGNOSIS — F319 Bipolar disorder, unspecified: Secondary | ICD-10-CM | POA: Insufficient documentation

## 2020-01-26 DIAGNOSIS — R69 Illness, unspecified: Secondary | ICD-10-CM | POA: Diagnosis not present

## 2020-01-26 DIAGNOSIS — E1021 Type 1 diabetes mellitus with diabetic nephropathy: Secondary | ICD-10-CM | POA: Diagnosis not present

## 2020-01-26 DIAGNOSIS — G43709 Chronic migraine without aura, not intractable, without status migrainosus: Secondary | ICD-10-CM | POA: Diagnosis not present

## 2020-01-26 DIAGNOSIS — I10 Essential (primary) hypertension: Secondary | ICD-10-CM | POA: Diagnosis not present

## 2020-01-27 ENCOUNTER — Other Ambulatory Visit: Payer: Self-pay | Admitting: Nurse Practitioner

## 2020-01-27 ENCOUNTER — Ambulatory Visit: Payer: Medicare HMO | Admitting: Orthopaedic Surgery

## 2020-01-29 ENCOUNTER — Encounter (INDEPENDENT_AMBULATORY_CARE_PROVIDER_SITE_OTHER): Payer: Self-pay | Admitting: Family Medicine

## 2020-02-02 ENCOUNTER — Ambulatory Visit (INDEPENDENT_AMBULATORY_CARE_PROVIDER_SITE_OTHER): Payer: Medicare HMO

## 2020-02-02 ENCOUNTER — Other Ambulatory Visit: Payer: Self-pay

## 2020-02-02 ENCOUNTER — Encounter: Payer: Self-pay | Admitting: Obstetrics and Gynecology

## 2020-02-02 ENCOUNTER — Ambulatory Visit (INDEPENDENT_AMBULATORY_CARE_PROVIDER_SITE_OTHER): Payer: Medicare HMO | Admitting: Obstetrics and Gynecology

## 2020-02-02 VITALS — BP 142/90 | HR 87 | Ht 66.0 in | Wt 204.0 lb

## 2020-02-02 DIAGNOSIS — N83202 Unspecified ovarian cyst, left side: Secondary | ICD-10-CM | POA: Diagnosis not present

## 2020-02-02 DIAGNOSIS — R102 Pelvic and perineal pain: Secondary | ICD-10-CM

## 2020-02-02 DIAGNOSIS — Z30431 Encounter for routine checking of intrauterine contraceptive device: Secondary | ICD-10-CM | POA: Diagnosis not present

## 2020-02-02 NOTE — Progress Notes (Signed)
Gynecology Ultrasound Follow Up  Chief Complaint:  Chief Complaint  Patient presents with  . Follow-up    GYN Ultrasound     History of Present Illness: Patient is a 34 y.o. female who presents today for ultrasound evaluation of pelvic pain and IUD location.  Ultrasound demonstrates the following findgins Adnexa: left complex ovarian cyst 3.9 x 3.5 x 2.3cm Uterus: Non-enlarged,  with endometrial stripe homogenous 4.31mm Additional: IUD in proper location  Review of Systems: Review of Systems  Constitutional: Negative.   Gastrointestinal: Positive for abdominal pain and constipation. Negative for diarrhea, heartburn, nausea and vomiting.  Genitourinary: Negative.     Past Medical History:  Past Medical History:  Diagnosis Date  . Anemia   . Anxiety   . Asthma   . Asthma 11/10/2013  . Back pain   . Bipolar 1 disorder ([place])   . Chest pain   . Chronic renal failure syndrome, stage 3 (moderate)   . Complication of anesthesia   . Constipation   . Depression   . Depression   . Diabetic gastroparesis ([place])   . Diabetic neuropathy, type I diabetes mellitus ([place])   . Edema, lower extremity   . Gallbladder disease   . Gastroparesis   . GERD (gastroesophageal reflux disease)   . Headache(784.0)   . Hypertension   . Joint pain   . Kidney stones   . Palpitations   . Polyneuropathy in diabetes(357.2)   . PONV (postoperative nausea and vomiting)   . 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 Shortness of breath   . Tachycardia    baseline tachycardia   . 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 ANAL RECTAL MANOMETRY N/A 03/25/2018   Procedure: ANO RECTAL MANOMETRY;  Surgeon: Doran Stabler, MD;  [facility];  Service: Gastroenterology;  Laterality: N/A;  . CARPAL TUNNEL RELEASE Left 10/16/2019   Procedure: LEFT CARPAL TUNNEL RELEASE;   Surgeon: Carole Civil, MD;  Location: [facility];  Service: Orthopedics;  Laterality: Left;  . CESAREAN SECTION     x 2  . CHOLECYSTECTOMY    . EYE SURGERY    . REFRACTIVE SURGERY Bilateral     Gynecologic History:  No LMP recorded. (Menstrual status: IUD).  Family History:  Family History  Problem Relation Age of Onset  . Hypertension Other   . Diabetes Mother        type 2  . Hypertension Mother   . Obesity Mother   . Diabetes Brother        type 1  . Renal Disease Brother        renal failure  . Hypertension Brother   . Diabetes Father   . Hypertension Father   . High Cholesterol Father   . Sleep apnea Father   . Obesity Father   . Heart disease Father   . Breast cancer Maternal Aunt 64  . Colon cancer Maternal Aunt   . Uterine cancer Maternal Aunt 65       same as breast cancer  . Uterine cancer Maternal Aunt 61  . Rectal cancer Neg Hx   . Esophageal cancer Neg Hx     Social History:  Social History   Socioeconomic History  . Marital status: Legally Separated    Spouse name: Not on file  . Number of children: 2  .  Years of education: Not on file  . Highest education level: Not on file  Occupational History  . Occupation: Home maker  Tobacco Use  . Smoking status: Never Smoker  . Smokeless tobacco: Never Used  Substance and Sexual Activity  . Alcohol use: No  . Drug use: No  . Sexual activity: Yes    Partners: Male    Birth control/protection: I.U.D.  Other Topics Concern  . Not on file  Social History Narrative   Pt has a daughter and boyfriend.    Dad passed away suddenly heart attack 12/2019   Social Determinants of Health   Financial Resource Strain:   . Difficulty of Paying Living Expenses:   Food Insecurity:   . Worried About Charity fundraiser in the Last Year:   . Arboriculturist in the Last Year:   Transportation Needs:   . Film/video editor (Medical):   Marland Kitchen Lack of Transportation (Non-Medical):   Physical Activity:   .  Days of Exercise per Week:   . Minutes of Exercise per Session:   Stress:   . Feeling of Stress :   Social Connections:   . Frequency of Communication with Friends and Family:   . Frequency of Social Gatherings with Friends and Family:   . Attends Religious Services:   . Active Member of Clubs or Organizations:   . Attends Archivist Meetings:   Marland Kitchen Marital Status:   Intimate Partner Violence:   . Fear of Current or Ex-Partner:   . Emotionally Abused:   Marland Kitchen Physically Abused:   . Sexually Abused:     Allergies:  Allergies  Allergen Reactions  . Toradol [Ketorolac Tromethamine]     Cannot take d/t kidney issues   . Ceftin Rash    Medications: Prior to Admission medications   Medication Sig Start Date End Date Taking? Authorizing Provider  acetaminophen (TYLENOL) 500 MG tablet Take 1,000 mg by mouth every 6 (six) hours as needed for mild pain or moderate pain.    Yes [provider]  albuterol (PROVENTIL) (2.5 MG/3ML) 0.083% nebulizer solution Take 3 mLs (2.5 mg total) by nebulization every 6 (six) hours as needed for wheezing or shortness of breath. 11/12/18  Yes Delsa Grana, PA-C  albuterol (VENTOLIN HFA) 108 (90 Base) MCG/ACT inhaler INHALE 2 PUFFS BY MOUTH EVERY 6 HOURS AS NEEDED FOR SHORTNESS OF BREATH Patient taking differently: Inhale 2 puffs into the lungs every 6 (six) hours as needed for wheezing or shortness of breath.  09/15/19  Yes [name], MD  AMBULATORY NON FORMULARY MEDICATION DOMPERIDONE 10 mg, twice a day. One with breakfast One with supper Dispense 60 tablets  2 refills 04/16/19  Yes Danis, Estill Cotta III, MD  busPIRone (BUSPAR) 15 MG tablet TAKE 2 TABLETS (30 MG TOTAL) BY MOUTH 2 (TWO) TIMES DAILY. 12/18/19  Yes Cottle, Billey Co., MD  carbamazepine (EQUETRO) 200 MG CP12 12 hr capsule Take 2 capsules (400 mg) by mouth in the morning & take 3 capsules (600 mg) by mouth at bedtime. 12/18/19  Yes Cottle, Billey Co., MD  ciclopirox [facility]) 8 %  solution Apply one coat to each toenail daily. Remove weekly with polish remover. 11/04/18  Yes [place], Jennifer L, DPM  clonazePAM (KLONOPIN) 0.5 MG tablet TAKE 1 TABLET BY MOUTH EVERY 8 HOURS AS NEEDED FOR ANXIETY. 01/19/20  Yes Cottle, Billey Co., MD  EMGALITY 120 MG/ML SOSY Inject into the skin every 30 (thirty) days.  08/05/19  Yes [provider]  enalapril (VASOTEC) 20 MG tablet Take 20 mg by mouth daily.    Yes [provider]  fluticasone (FLONASE) 50 MCG/ACT nasal spray Place 2 sprays into both nostrils daily. Patient taking differently: Place 2 sprays into both nostrils daily as needed (seasonal allergies.).  11/12/18  Yes Delsa Grana, PA-C  furosemide (LASIX) 40 MG tablet Take 40 mg by mouth every other day.    Yes [provider]  gabapentin (NEURONTIN) 600 MG tablet Take 1 tablet (600 mg total) by mouth 3 (three) times daily. 11/19/19  Yes Cottle, Billey Co., MD  GLUCAGON EMERGENCY 1 MG injection Inject 1 mg into the skin as needed (hypoglycemia.).  04/30/19  Yes [provider]  Insulin Human (INSULIN PUMP) SOLN Inject 1 each into the skin 3 times daily with meals, bedtime and 2 AM. Novolog: 36-38 units per day   Yes [provider]  lamoTRIgine (LAMICTAL) 150 MG tablet TAKE 2 TABLETS (300 MG TOTAL) BY MOUTH 2 (TWO) TIMES DAILY. 12/18/19  Yes Cottle, Billey Co., MD  LATUDA 120 MG TABS TAKE 1 TABLET BY MOUTH EVERY DAY 12/18/19  Yes Cottle, Billey Co., MD  mometasone-formoterol [facility]) 100-5 MCG/ACT AERO Inhale 2 puffs into the lungs 2 (two) times daily. 03/15/18  Yes Delsa Grana, PA-C  montelukast (SINGULAIR) 10 MG tablet Take 1 tablet (10 mg total) by mouth at bedtime. 10/25/18  Yes [name], MD  NOVOLOG 100 UNIT/ML injection Inject 36-38 Units into the skin daily. Via insulin pump per sliding scale 10/04/18  Yes [provider]  pantoprazole (PROTONIX) 20 MG tablet TAKE 1 TABLET BY MOUTH DAILY BEFORE BREAKFAST. TAKE AT LEAST  30 MINUTES BEFORE BREAKFAST 01/27/20  Yes Willia Craze, NP  Pramipexole Dihydrochloride 0.75 MG TB24 TAKE 1 TABLET BY MOUTH EVERY DAY IN THE EVENING 12/18/19  Yes Cottle, Billey Co., MD  promethazine (PHENERGAN) 25 MG tablet Take 0.5 tablets (12.5 mg total) by mouth every 8 (eight) hours as needed for nausea or vomiting. Patient taking differently: Take 12.5-25 mg by mouth every 8 (eight) hours as needed for nausea or vomiting.  04/15/19  Yes Doran Stabler, MD  propranolol (INDERAL) 20 MG tablet Take 1/2-1 tabs po TID for anxiety 01/03/20  Yes Thayer Headings, PMHNP  Prucalopride Succinate (MOTEGRITY) 2 MG TABS Take 1 tablet (2 mg total) by mouth every morning. 08/06/19  Yes Willia Craze, NP  SUMAtriptan Succinate 3 MG/0.5ML SOAJ Inject into the skin. To use as needed for migraines   Yes [provider]  tiZANidine (ZANAFLEX) 4 MG tablet Take 4 mg by mouth 2 (two) times daily as needed. 12/07/19  Yes [provider]  traZODone (DESYREL) 50 MG tablet Take 1 tablet (50 mg total) by mouth at bedtime. Patient taking differently: Take 50 mg by mouth at bedtime as needed for sleep.  12/03/18  Yes Cottle, Billey Co., MD  Ubrogepant (UBRELVY) 100 MG TABS Take 100 mg by mouth daily as needed (migraine headaches.).    Yes [provider]  Urea 40 % LOTN Apply 1 application topically daily. Apply only to calluses 06/02/19  Yes Trula Slade, DPM  Vitamin D, Ergocalciferol, (DRISDOL) 1.25 MG (50000 UNIT) CAPS capsule Take 1 capsule (50,000 Units total) by mouth every 7 (seven) days. 12/22/19  Yes Briscoe Deutscher, DO  etonogestrel (NEXPLANON) 68 MG IMPL implant 68 mg by Subdermal route once.     [provider]  Physical Exam Vitals: Blood pressure (!) 142/90, pulse 87, height 5\' 6"  (1.676 m), weight 204 lb (92.5 kg).  General: NAD HEENT: normocephalic, anicteric Pulmonary: No increased work of breathing Extremities: no edema, erythema, or  tenderness Neurologic: Grossly intact, normal gait Psychiatric: mood appropriate, affect full  US Transvaginal Non-OB  Result Date: 02/02/2020 Patient Name: [name] DOB: 10/30/1985 MRN: [id number] ULTRASOUND REPORT Location: [facility] Date of Service: 02/02/2020 Indications:Pelvic Pain Findings: The uterus is anteverted and measures 10.2 x 6.6 x 5.4 cm . Echo texture is heterogenous without evidence of focal masses. The Endometrium measures 4.1 mm. The IUD is correctly placed within the uterus. Right Ovary measures 4.0 x 1.7 x 2.2 cm. It is normal in appearance. Left Ovary measures 3.9 x 3.5 x 2.3 cm.  There is a complex cyst in the left ovary. Blood flow is seen around the periphery of the cyst only. This may represent a corpus luteal cyst. The cyst measures 32.1 x 20.1 x 19.7 mm Survey of the adnexa demonstrates no adnexal masses. There is scant free fluid in the cul de sac. Impression: 1. The IUD is correctly placed within the uterus. 2. There is a complex cyst in the left ovary, possible corpus luteum. Recommendations: 1.Clinical correlation with the patient's History and Physical Exam. Gweneth Dimitri, RT Images reviewed.  IUD is in correct position within the endometrial cavity.  There is a complex possibly hemorrhagic cyst in the left ovary measuring 3.9 x 3.5 x 2.3cm. Malachy Mood, MD, [facility], [facility] 02/02/2020, 11:54 AM     Assessment: 34 y.o. G2P1 pelvic pain with ultrasound today  Plan: Problem List Items Addressed This Visit    None    Visit Diagnoses    Left ovarian cyst    -  Primary   Relevant Orders   US Transvaginal Non-OB      1) Left ovarian cyst - appearance most consistent with a hemorrhagic corpus luteum cyst.  Will obtain follow up imaging in 6 week to assess for resolution.  If remains present or enlarges then endometrioma is also in the differential.  In case of endometrioma and symptoms discussed that endometriomas  generally will need to be removed for symptoms improvement.  2) A total of 15 minutes were spent in face-to-face contact with the patient during this encounter with over half of that time devoted to counseling and coordination of care.  3) Return in about 6 weeks (around 03/15/2020) for Follow up ultrasound.    Malachy Mood, MD, [facility], [facility] 02/02/2020, 12:08 PM

## 2020-02-04 DIAGNOSIS — E1065 Type 1 diabetes mellitus with hyperglycemia: Secondary | ICD-10-CM | POA: Diagnosis not present

## 2020-02-04 DIAGNOSIS — E1040 Type 1 diabetes mellitus with diabetic neuropathy, unspecified: Secondary | ICD-10-CM | POA: Diagnosis not present

## 2020-02-05 ENCOUNTER — Encounter (INDEPENDENT_AMBULATORY_CARE_PROVIDER_SITE_OTHER): Payer: Self-pay | Admitting: Family Medicine

## 2020-02-05 ENCOUNTER — Ambulatory Visit (INDEPENDENT_AMBULATORY_CARE_PROVIDER_SITE_OTHER): Payer: Medicare HMO | Admitting: Family Medicine

## 2020-02-05 ENCOUNTER — Other Ambulatory Visit: Payer: Self-pay

## 2020-02-05 VITALS — BP 112/75 | Temp 99.1°F | Ht 66.0 in | Wt 194.0 lb

## 2020-02-05 DIAGNOSIS — E559 Vitamin D deficiency, unspecified: Secondary | ICD-10-CM | POA: Diagnosis not present

## 2020-02-05 DIAGNOSIS — E669 Obesity, unspecified: Secondary | ICD-10-CM | POA: Diagnosis not present

## 2020-02-05 DIAGNOSIS — F3289 Other specified depressive episodes: Secondary | ICD-10-CM | POA: Diagnosis not present

## 2020-02-05 DIAGNOSIS — Z6830 Body mass index (BMI) 30.0-30.9, adult: Secondary | ICD-10-CM | POA: Diagnosis not present

## 2020-02-05 DIAGNOSIS — R69 Illness, unspecified: Secondary | ICD-10-CM | POA: Diagnosis not present

## 2020-02-06 ENCOUNTER — Telehealth: Payer: Self-pay | Admitting: Family Medicine

## 2020-02-06 NOTE — Telephone Encounter (Signed)
Patient would like referral to rheumatologists if possible for her fibromyalgia

## 2020-02-09 NOTE — Telephone Encounter (Signed)
OK to do referral

## 2020-02-09 NOTE — Telephone Encounter (Signed)
Rheumatology doesn't usually treat fibromyalgia. I'd be glad to see her.

## 2020-02-10 MED ORDER — VITAMIN D (ERGOCALCIFEROL) 1.25 MG (50000 UNIT) PO CAPS: 50000.0000 [IU] | ORAL_CAPSULE | ORAL | 0 refills | Status: DC

## 2020-02-10 NOTE — Progress Notes (Signed)
Chief Complaint:   OBESITY [name] is here to discuss her progress with her obesity treatment plan along with follow-up of her obesity related diagnoses. Breilyn is on keeping a food journal and adhering to recommended goals of 1500 calories and 75 grams of protein and states she is following her eating plan approximately 100% of the time for 1 week. [name] states she is exercising for 0 minutes 0 times per week.  Today's visit was #: 4 Starting weight: 199 lbs Starting date: 11/13/2019 Today's weight: 194 lbs Today's date: 02/05/2020 Total lbs lost to date: 5 lbs Total lbs lost since last in-office visit: 0  Interim History: [name]'s father died a couple of weeks ago.  He suffered an MI while visiting family in [place] and [name] was on the phone with him when it happened.  She has struggled with grief and emotional eating.  She is ready to get back on track.  Subjective:   1. Vitamin D deficiency Braeley's Vitamin D level was 24.5 on 11/13/2019. She is currently taking prescription vitamin D 50,000 IU each week. She denies nausea, vomiting or muscle weakness.  2. Other depression, with emotional eating Recent family loss, traumatic event.  [name] has history of bipolar, followed by psychiatry.  No active suicidal ideation.  History of suicide attempts.  She has a support system and barriers in place.  Assessment/Plan:   1. Vitamin D deficiency Low Vitamin D level contributes to fatigue and are associated with obesity, breast, and colon cancer. She agrees to continue to take prescription Vitamin D @50 ,000 IU every week and will follow-up for routine testing of Vitamin D, at least 2-3 times per year to avoid over-replacement.  Orders - Vitamin D, Ergocalciferol, (DRISDOL) 1.25 MG (50000 UNIT) CAPS capsule; Take 1 capsule (50,000 Units total) by mouth every 7 (seven) days.  Dispense: 4 capsule; Refill: 0  2. Other depression, with emotional eating Behavior modification  techniques were discussed today to help [name] deal with her emotional/non-hunger eating behaviors.  Orders and follow up as documented in patient record.   3. Class 1 obesity with serious comorbidity and body mass index (BMI) of 30.0 to 30.9 in adult, unspecified obesity type [name] is currently in the action stage of change. As such, her goal is to continue with weight loss efforts. She has agreed to keeping a food journal and adhering to recommended goals of 1500 calories and 75 grams of protein.   Exercise goals: Walking for stress reduction.  Behavioral modification strategies: increasing lean protein intake and increasing Cristina intake.  [name] has agreed to follow-up with our clinic in 2 weeks. She was informed of the importance of frequent follow-up visits to maximize her success with intensive lifestyle modifications for her multiple health conditions.   Objective:   Blood pressure 112/75, temperature 99.1 F (37.3 C), temperature source Oral, height 5\' 6"  (1.676 m), weight 194 lb (88 kg), SpO2 100 %. Body mass index is 31.31 kg/m.  General: Cooperative, alert, well developed, in no acute distress. HEENT: Conjunctivae and lids unremarkable. Cardiovascular: Regular rhythm.  Lungs: Normal work of breathing. Neurologic: No focal deficits.   Lab Results  Component Value Date   CREATININE 1.56 (H) 12/04/2019   BUN 32 (H) 12/04/2019   NA 137 12/04/2019   K 4.9 12/04/2019   CL 95 (L) 12/04/2019   CO2 27 12/04/2019   Lab Results  Component Value Date   ALT 23 12/04/2019   AST 21 12/04/2019   ALKPHOS  148 (H) 12/04/2019   BILITOT <0.2 12/04/2019   Lab Results  Component Value Date   HGBA1C 8.0 (H) 09/09/2019   HGBA1C 9.0 04/05/2017   HGBA1C 11.2 (H) 11/24/2015   HGBA1C 13.3 (H) 04/22/2014   HGBA1C 10.7 (H) 10/11/2013   Lab Results  Component Value Date   TSH 0.859 11/13/2019   Lab Results  Component Value Date   CHOL 149 04/22/2014   HDL 71 04/22/2014    LDLCALC 68 04/22/2014   TRIG 49 04/22/2014   CHOLHDL 2.1 04/22/2014   Lab Results  Component Value Date   WBC 7.5 12/04/2019   HGB 13.3 12/04/2019   HCT 39.3 12/04/2019   MCV 88 12/04/2019   PLT 316 12/04/2019   Lab Results  Component Value Date   IRON 41 11/13/2019   TIBC 267 11/13/2019   FERRITIN 77 11/13/2019   Obesity Behavioral Intervention:   Approximately 15 minutes were spent on the discussion below.  ASK: We discussed the diagnosis of obesity with [name] today and [name] agreed to give [name] permission to discuss obesity behavioral modification therapy today.  ASSESS: [name] has the diagnosis of obesity and her BMI today is 31.3. [name] is in the action stage of change.   ADVISE: [name] was educated on the multiple health risks of obesity as well as the benefit of weight loss to improve her health. She was advised of the need for long term treatment and the importance of lifestyle modifications to improve her current health and to decrease her risk of future health problems.  AGREE: Multiple dietary modification options and treatment options were discussed and [name] agreed to follow the recommendations documented in the above note.  ARRANGE: [name] was educated on the importance of frequent visits to treat obesity as outlined per CMS and USPSTF guidelines and agreed to schedule her next follow up appointment today.  Attestation Statements:   Reviewed by clinician on day of visit: allergies, medications, problem list, medical history, surgical history, family history, social history, and previous encounter notes.  I, [name], CMA, am acting as Location manager for [employer], DO.  I have reviewed the above documentation for accuracy and completeness, and I agree with the above. Cristina Deutscher, DO

## 2020-02-18 ENCOUNTER — Ambulatory Visit (INDEPENDENT_AMBULATORY_CARE_PROVIDER_SITE_OTHER): Payer: Medicare HMO | Admitting: Family Medicine

## 2020-02-21 ENCOUNTER — Other Ambulatory Visit: Payer: Self-pay | Admitting: Psychiatry

## 2020-02-21 DIAGNOSIS — F314 Bipolar disorder, current episode depressed, severe, without psychotic features: Secondary | ICD-10-CM

## 2020-02-26 ENCOUNTER — Telehealth: Payer: Self-pay

## 2020-02-26 NOTE — Telephone Encounter (Signed)
Pt states she went to the bathroom and had a lot of watery fluid , she's not pregnant. Wondering if this was a cyst that ruptured, 2 days ago had sharp pain on right side and now  On the pain scale the pain is a  6 today . Does she need to be seen for this? Please advise

## 2020-02-26 NOTE — Telephone Encounter (Signed)
Please advise 

## 2020-02-26 NOTE — Telephone Encounter (Signed)
So a cyst that ruptures generally will not evidence itself as vaginal fluid it usually remains intraabdominal and gets reabsorbed.  If she is otherwise asymptomatic and does not continue to have further discharge does not need evaluation

## 2020-02-26 NOTE — Telephone Encounter (Signed)
Pt is aware.  

## 2020-02-27 DIAGNOSIS — E1040 Type 1 diabetes mellitus with diabetic neuropathy, unspecified: Secondary | ICD-10-CM | POA: Diagnosis not present

## 2020-02-27 DIAGNOSIS — E1065 Type 1 diabetes mellitus with hyperglycemia: Secondary | ICD-10-CM | POA: Diagnosis not present

## 2020-03-01 ENCOUNTER — Encounter (INDEPENDENT_AMBULATORY_CARE_PROVIDER_SITE_OTHER): Payer: Self-pay | Admitting: Family Medicine

## 2020-03-01 ENCOUNTER — Ambulatory Visit (INDEPENDENT_AMBULATORY_CARE_PROVIDER_SITE_OTHER): Payer: Medicare HMO | Admitting: Family Medicine

## 2020-03-01 ENCOUNTER — Other Ambulatory Visit: Payer: Self-pay

## 2020-03-01 VITALS — BP 132/80 | HR 88 | Temp 98.4°F | Ht 66.0 in | Wt 193.0 lb

## 2020-03-01 DIAGNOSIS — R5383 Other fatigue: Secondary | ICD-10-CM

## 2020-03-01 DIAGNOSIS — E669 Obesity, unspecified: Secondary | ICD-10-CM

## 2020-03-01 DIAGNOSIS — E109 Type 1 diabetes mellitus without complications: Secondary | ICD-10-CM | POA: Diagnosis not present

## 2020-03-01 DIAGNOSIS — F314 Bipolar disorder, current episode depressed, severe, without psychotic features: Secondary | ICD-10-CM

## 2020-03-01 DIAGNOSIS — Z6831 Body mass index (BMI) 31.0-31.9, adult: Secondary | ICD-10-CM

## 2020-03-01 DIAGNOSIS — E559 Vitamin D deficiency, unspecified: Secondary | ICD-10-CM | POA: Diagnosis not present

## 2020-03-01 DIAGNOSIS — E538 Deficiency of other specified B group vitamins: Secondary | ICD-10-CM | POA: Diagnosis not present

## 2020-03-01 DIAGNOSIS — R69 Illness, unspecified: Secondary | ICD-10-CM | POA: Diagnosis not present

## 2020-03-02 ENCOUNTER — Other Ambulatory Visit: Payer: Self-pay

## 2020-03-02 DIAGNOSIS — J45901 Unspecified asthma with (acute) exacerbation: Secondary | ICD-10-CM

## 2020-03-02 LAB — CBC WITH DIFFERENTIAL/PLATELET
Basophils Absolute: 0.1 10*3/uL (ref 0.0–0.2)
Basos: 1 %
EOS (ABSOLUTE): 0.5 10*3/uL — ABNORMAL HIGH (ref 0.0–0.4)
Eos: 6 %
Hematocrit: 37.9 % (ref 34.0–46.6)
Hemoglobin: 12.7 g/dL (ref 11.1–15.9)
Immature Grans (Abs): 0 10*3/uL (ref 0.0–0.1)
Immature Granulocytes: 0 %
Lymphocytes Absolute: 2.5 10*3/uL (ref 0.7–3.1)
Lymphs: 30 %
MCH: 30.2 pg (ref 26.6–33.0)
MCHC: 33.5 g/dL (ref 31.5–35.7)
MCV: 90 fL (ref 79–97)
Monocytes Absolute: 0.6 10*3/uL (ref 0.1–0.9)
Monocytes: 7 %
Neutrophils Absolute: 4.7 10*3/uL (ref 1.4–7.0)
Neutrophils: 56 %
Platelets: 257 10*3/uL (ref 150–450)
RBC: 4.2 x10E6/uL (ref 3.77–5.28)
RDW: 11.9 % (ref 11.7–15.4)
WBC: 8.2 10*3/uL (ref 3.4–10.8)

## 2020-03-02 LAB — VITAMIN D 25 HYDROXY (VIT D DEFICIENCY, FRACTURES): Vit D, 25-Hydroxy: 40.5 ng/mL (ref 30.0–100.0)

## 2020-03-02 LAB — COMPREHENSIVE METABOLIC PANEL
ALT: 21 IU/L (ref 0–32)
AST: 23 IU/L (ref 0–40)
Albumin/Globulin Ratio: 2.4 — ABNORMAL HIGH (ref 1.2–2.2)
Albumin: 4.6 g/dL (ref 3.8–4.8)
Alkaline Phosphatase: 134 IU/L — ABNORMAL HIGH (ref 48–121)
BUN/Creatinine Ratio: 19 (ref 9–23)
BUN: 29 mg/dL — ABNORMAL HIGH (ref 6–20)
Bilirubin Total: 0.3 mg/dL (ref 0.0–1.2)
CO2: 24 mmol/L (ref 20–29)
Calcium: 9.2 mg/dL (ref 8.7–10.2)
Chloride: 101 mmol/L (ref 96–106)
Creatinine, Ser: 1.54 mg/dL — ABNORMAL HIGH (ref 0.57–1.00)
GFR calc Af Amer: 50 mL/min/{1.73_m2} — ABNORMAL LOW (ref >59)
GFR calc non Af Amer: 43 mL/min/{1.73_m2} — ABNORMAL LOW (ref >59)
Globulin, Total: 1.9 g/dL (ref 1.5–4.5)
Glucose: 191 mg/dL — ABNORMAL HIGH (ref 65–99)
Potassium: 4.8 mmol/L (ref 3.5–5.2)
Sodium: 137 mmol/L (ref 134–144)
Total Protein: 6.5 g/dL (ref 6.0–8.5)

## 2020-03-02 LAB — VITAMIN B12: Vitamin B-12: 1468 pg/mL — ABNORMAL HIGH (ref 232–1245)

## 2020-03-02 MED ORDER — ALBUTEROL SULFATE HFA 108 (90 BASE) MCG/ACT IN AERS: 2.0000 | INHALATION_SPRAY | Freq: Four times a day (QID) | RESPIRATORY_TRACT | 2 refills | Status: DC | PRN

## 2020-03-02 NOTE — Progress Notes (Signed)
Chief Complaint:   OBESITY Lainey is here to discuss her progress with her obesity treatment plan along with follow-up of her obesity related diagnoses. Kaiesha is on keeping a food journal and adhering to recommended goals of 1500 calories and 75 grams of protein and states she is following her eating plan approximately 100% of the time. Cyndal states she is walking for 30 minutes 2 times per week.  Today's visit was #: 5 Starting weight: 199 lbs Starting date: 11/13/2019 Today's weight: 193 lbs Today's date: 03/01/2020 Total lbs lost to date: 6 lbs Total lbs lost since last in-office visit: 1 lb  Subjective:   1. Bipolar disorder with severe depression, grief Kari's father died last month.  She was on the phone with him when he suffered an MI.  She does not feel supported by her husband or her mother.  She says that she stopped her medications for a few days a week ago.  She denies current SI, but admits to struggling with grief.  She has no counselor at the time but was going to [place] previously.  Assessment/Plan:   1. Bipolar disorder with severe depression, grief She will call her psychiatrist to update him.  We will place an urgent referral to [facility] as well.  Orders - Comprehensive metabolic panel - CBC with Differential/Platelet - VITAMIN D 25 Hydroxy (Vit-D Deficiency, Fractures) - Vitamin B12  2. Class 1 obesity with serious comorbidity and body mass index (BMI) of 31.0 to 31.9 in adult, unspecified obesity type Mireyah is currently in the action stage of change. As such, her goal is to continue with weight loss efforts. She has agreed to keeping a food journal and adhering to recommended goals of 1500 calories and 75 grams of protein.   Exercise goals: For substantial health benefits, adults should do at least 150 minutes (2 hours and 30 minutes) a week of moderate-intensity, or 75 minutes (1 hour and 15 minutes) a week of vigorous-intensity aerobic  physical activity, or an equivalent combination of moderate- and vigorous-intensity aerobic activity. Aerobic activity should be performed in episodes of at least 10 minutes, and preferably, it should be spread throughout the week.  Behavioral modification strategies: increasing lean protein intake and decreasing liquid calories.  Jazmarie has agreed to follow-up with our clinic in 2 weeks. She was informed of the importance of frequent follow-up visits to maximize her success with intensive lifestyle modifications for her multiple health conditions.   Bevelyn was informed we would discuss her lab results at her next visit unless there is a critical issue that needs to be addressed sooner. Calysta agreed to keep her next visit at the agreed upon time to discuss these results.  Objective:   Blood pressure 132/80, pulse 88, temperature 98.4 F (36.9 C), temperature source Oral, height 5\' 6"  (1.676 m), weight 193 lb (87.5 kg), SpO2 100 %. Body mass index is 31.15 kg/m.  General: Cooperative, alert, well developed, in no acute distress. HEENT: Conjunctivae and lids unremarkable. Cardiovascular: Regular rhythm.  Lungs: Normal work of breathing. Neurologic: No focal deficits.   Lab Results  Component Value Date   CREATININE 1.56 (H) 12/04/2019   BUN 32 (H) 12/04/2019   NA 137 12/04/2019   K 4.9 12/04/2019   CL 95 (L) 12/04/2019   CO2 27 12/04/2019   Lab Results  Component Value Date   ALT 23 12/04/2019   AST 21 12/04/2019   ALKPHOS 148 (H) 12/04/2019   BILITOT <0.2 12/04/2019  Lab Results  Component Value Date   HGBA1C 8.0 (H) 09/09/2019   HGBA1C 9.0 04/05/2017   HGBA1C 11.2 (H) 11/24/2015   HGBA1C 13.3 (H) 04/22/2014   HGBA1C 10.7 (H) 10/11/2013   No results found for: INSULIN Lab Results  Component Value Date   TSH 0.859 11/13/2019   Lab Results  Component Value Date   CHOL 149 04/22/2014   HDL 71 04/22/2014   LDLCALC 68 04/22/2014   TRIG 49 04/22/2014   CHOLHDL  2.1 04/22/2014   Lab Results  Component Value Date   WBC 7.5 12/04/2019   HGB 13.3 12/04/2019   HCT 39.3 12/04/2019   MCV 88 12/04/2019   PLT 316 12/04/2019   Lab Results  Component Value Date   IRON 41 11/13/2019   TIBC 267 11/13/2019   FERRITIN 77 11/13/2019   Obesity Behavioral Intervention:   Approximately 15 minutes were spent on the discussion below.  ASK: We discussed the diagnosis of obesity with Nari today and Cris agreed to give [name] permission to discuss obesity behavioral modification therapy today.  ASSESS: Jylian has the diagnosis of obesity and her BMI today is 31.2. Glendy is in the action stage of change.   ADVISE: Tineshia was educated on the multiple health risks of obesity as well as the benefit of weight loss to improve her health. She was advised of the need for long term treatment and the importance of lifestyle modifications to improve her current health and to decrease her risk of future health problems.  AGREE: Multiple dietary modification options and treatment options were discussed and Burnell agreed to follow the recommendations documented in the above note.  ARRANGE: Maleya was educated on the importance of frequent visits to treat obesity as outlined per CMS and USPSTF guidelines and agreed to schedule her next follow up appointment today.  Attestation Statements:   Reviewed by clinician on day of visit: allergies, medications, problem list, medical history, surgical history, family history, social history, and previous encounter notes.  I, Water quality scientist, CMA, am acting as Location manager for [employer], DO.  I have reviewed the above documentation for accuracy and completeness, and I agree with the above. Briscoe Deutscher, DO

## 2020-03-05 DIAGNOSIS — E1065 Type 1 diabetes mellitus with hyperglycemia: Secondary | ICD-10-CM | POA: Diagnosis not present

## 2020-03-05 DIAGNOSIS — E1040 Type 1 diabetes mellitus with diabetic neuropathy, unspecified: Secondary | ICD-10-CM | POA: Diagnosis not present

## 2020-03-11 ENCOUNTER — Ambulatory Visit: Payer: Medicare HMO | Admitting: Orthopaedic Surgery

## 2020-03-13 DIAGNOSIS — Z20822 Contact with and (suspected) exposure to covid-19: Secondary | ICD-10-CM | POA: Diagnosis not present

## 2020-03-15 ENCOUNTER — Telehealth: Payer: Self-pay | Admitting: Family Medicine

## 2020-03-15 NOTE — Telephone Encounter (Signed)
[name] tested positive with Covid, had preadmission test done, the nurse notified her on 03-13-20. She's having a cough, sharp left back pain, then it goes into dull pain, has headache, body aches. She stated shes going to call her neurologist.  Did give her a triage call.

## 2020-03-15 NOTE — Telephone Encounter (Signed)
CB# [phone] Pt was told advise primary she was positive for Coivd on 03/13/20 having back pain.

## 2020-03-15 NOTE — Telephone Encounter (Signed)
Pt verbalized understanding on instructions

## 2020-03-15 NOTE — Telephone Encounter (Signed)
Symptoms listed are consistent with covid-19 infection She can take tylenol or ibuprofen for the headache pain, which ever she tolerates Keep hydrated and rest If she gests SOB or the cough/back pain does not improve she needs to go to UR or ER for imaging of her chest

## 2020-03-16 ENCOUNTER — Telehealth: Payer: Self-pay | Admitting: Gastroenterology

## 2020-03-16 DIAGNOSIS — R110 Nausea: Secondary | ICD-10-CM

## 2020-03-16 MED ORDER — PROMETHAZINE HCL 25 MG PO TABS
25.0000 mg | ORAL_TABLET | Freq: Three times a day (TID) | ORAL | 0 refills | Status: AC | PRN
Start: 2020-03-16 — End: ?

## 2020-03-16 NOTE — Telephone Encounter (Signed)
Patient states she has tested positive for COVID 19 on 03-13-2020. She complains of an increase of nausea from this and is requesting a refill on her phenergan.

## 2020-03-16 NOTE — Telephone Encounter (Signed)
Please find out preferred pharmacy and if she is taking the domperidone

## 2020-03-16 NOTE — Telephone Encounter (Signed)
Thank you.  Prescription sent.  - HD

## 2020-03-16 NOTE — Telephone Encounter (Signed)
She states that she has not yet started the Domperidone.  Pharmacy has been updated correctly to [facility] in [place]

## 2020-03-17 ENCOUNTER — Emergency Department (HOSPITAL_COMMUNITY)
Admission: EM | Admit: 2020-03-17 | Discharge: 2020-03-17 | Disposition: A | Discharge status: Home / Self Care | Payer: Medicare Other | Attending: Emergency Medicine | Admitting: Emergency Medicine

## 2020-03-17 ENCOUNTER — Other Ambulatory Visit: Payer: Self-pay

## 2020-03-17 ENCOUNTER — Telehealth: Payer: Self-pay | Admitting: *Deleted

## 2020-03-17 DIAGNOSIS — Z9641 Presence of insulin pump (external) (internal): Secondary | ICD-10-CM | POA: Insufficient documentation

## 2020-03-17 DIAGNOSIS — R519 Headache, unspecified: Secondary | ICD-10-CM | POA: Diagnosis not present

## 2020-03-17 DIAGNOSIS — N183 Chronic kidney disease, stage 3 unspecified: Secondary | ICD-10-CM | POA: Diagnosis not present

## 2020-03-17 DIAGNOSIS — E103411 Type 1 diabetes mellitus with severe nonproliferative diabetic retinopathy with macular edema, right eye: Secondary | ICD-10-CM | POA: Diagnosis not present

## 2020-03-17 DIAGNOSIS — Z79899 Other long term (current) drug therapy: Secondary | ICD-10-CM | POA: Diagnosis not present

## 2020-03-17 DIAGNOSIS — U071 COVID-19: Secondary | ICD-10-CM | POA: Insufficient documentation

## 2020-03-17 DIAGNOSIS — I129 Hypertensive chronic kidney disease with stage 1 through stage 4 chronic kidney disease, or unspecified chronic kidney disease: Secondary | ICD-10-CM | POA: Diagnosis not present

## 2020-03-17 DIAGNOSIS — E1022 Type 1 diabetes mellitus with diabetic chronic kidney disease: Secondary | ICD-10-CM | POA: Insufficient documentation

## 2020-03-17 MED ORDER — DROPERIDOL 2.5 MG/ML IJ SOLN
1.2500 mg | Freq: Once | INTRAMUSCULAR | Status: AC
  Administered 2020-03-17: 1.25 mg via INTRAVENOUS
  Filled 2020-03-17: qty 2

## 2020-03-17 MED ORDER — DIPHENHYDRAMINE HCL 50 MG/ML IJ SOLN
50.0000 mg | Freq: Once | INTRAMUSCULAR | Status: AC
  Administered 2020-03-17: 50 mg via INTRAVENOUS
  Filled 2020-03-17: qty 1

## 2020-03-17 MED ORDER — METOCLOPRAMIDE HCL 5 MG/ML IJ SOLN
10.0000 mg | Freq: Once | INTRAMUSCULAR | Status: DC
  Filled 2020-03-17: qty 2

## 2020-03-17 MED ORDER — SODIUM CHLORIDE 0.9 % IV BOLUS
500.0000 mL | Freq: Once | INTRAVENOUS | Status: AC
  Administered 2020-03-17: 500 mL via INTRAVENOUS

## 2020-03-17 MED ORDER — PROMETHAZINE HCL 25 MG/ML IJ SOLN
25.0000 mg | Freq: Once | INTRAMUSCULAR | Status: AC
  Administered 2020-03-17: 25 mg via INTRAVENOUS
  Filled 2020-03-17: qty 1

## 2020-03-17 NOTE — ED Notes (Signed)
Patient verbalizes understanding of discharge instructions. Opportunity for questioning and answers were provided. Armband removed by staff, pt discharged from ED ambulatory to home.  

## 2020-03-17 NOTE — ED Provider Notes (Signed)
[facility] EMERGENCY DEPARTMENT Provider Note   CSN: [id number] Arrival date & time: 03/17/20  1304     History Chief Complaint  Patient presents with  . Migraine    [name] is a 34 y.o. female.  The history is provided by the patient and medical records. No language interpreter was used.  Migraine     34 year old female with history of bipolar, anxiety, diabetes, recurrent headache, CKD, recently diagnosed with COVID-19 approximately 4 days ago presenting complaining of headache.  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  She has undergone multiple eye surgery.  Most recently she was scheduled for another eye surgery but had a routine Covid test screen several days ago when she was notified that she tested positive.  Patient states she did not have any significant symptoms prior to being tested.  She now report occasional cough and some sinus congestion but no loss of taste or smell no chest pain or shortness of breath.  She however developed gradual onset of throbbing headache ongoing for the past week.  States she has history of migraine headache but this headache last longer.  Headache is moderate in severity with light and sound sensitivity and associate nausea.  No myalgias.  No rash.  She tries taking her headache medication at home which provides temporary relief.  She denies any focal numbness or focal weakness or confusion.  She has not had her Covid vaccination.  She denies neck stiffness. Past Medical History:  Diagnosis Date  . Anemia   . Anxiety   . Asthma   . Asthma 11/10/2013  . Back pain   . Bipolar 1 disorder ([place])   . Chest pain   . Chronic renal failure syndrome, stage 3 (moderate)   . Complication of anesthesia   . Constipation   . Depression   . Depression   . Diabetic gastroparesis ([place])   . Diabetic neuropathy, type I diabetes mellitus ([place])   . Edema, lower extremity   . Gallbladder  disease   . Gastroparesis   . GERD (gastroesophageal reflux disease)   . Headache(784.0)   . Hypertension   . Joint pain   . Kidney stones   . Palpitations   . Polyneuropathy in diabetes(357.2)   . PONV (postoperative nausea and vomiting)   . 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[place])   . Shortness of breath   . Tachycardia    baseline tachycardia   . 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 S/P carpal tunnel release left 10/16/19 11/04/2019  . Diplopia 07/23/2019  . Epiretinal membrane (ERM) of left eye 07/23/2019  . 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  . Pseudophakia of left eye 11/04/2018  . Constipation   . CKD (chronic kidney disease), stage III 01/01/2017  . Diabetic nephropathy associated with type 1 diabetes mellitus ([place]) 03/01/2016  . Bipolar disorder in partial remission ([place]) 02/01/2016  . Chronic hypertension 02/01/2016  . DM retinopathy ([place])   . Encounter for preconception consultation   . After-cataract obscuring vision, left 09/22/2014  . MDD (major depressive disorder), severe ([place]) 05/31/2014  . Drug overdose, intentional ([place]) 03/27/2014  . Poisoning by drug or medicinal substance 03/27/2014  . Asthma 11/10/2013  . Colles' fracture of left radius 10/11/2013  . Type I diabetes mellitus with complication, uncontrolled ([place]) 10/11/2013  . MDD (major depressive  disorder), recurrent episode, severe ([place]) 09/26/2013  . Generalized anxiety disorder 09/26/2013  . Aphakia, right eye 09/14/2012  . Retinal detachment, tractional, left eye 06/11/2012  . Anemia 06/07/2012  . Eczema 06/07/2012  . Renal calculi 06/07/2012  . Hyperglycemia 04/30/2012  . Diabetic retinopathy associated with type 1 diabetes mellitus ([place]) 04/30/2012  . Polyneuropathy in diabetes(357.2) 04/30/2012  . H/O insertion of insulin pump 04/30/2012  . Polyneuropathy in diabetes ([place])  04/30/2012  . CHOLELITHIASIS 08/10/2010  . ATRIAL TACHYCARDIA 08/08/2010  . Gastroparesis 08/08/2010  . LIVER FUNCTION TESTS, ABNORMAL, HX OF 08/08/2010  . Type 1 diabetes mellitus with ketoacidosis, uncontrolled ([place]) 08/02/2010  . BOWEL INTUSSUSCEPTION 08/02/2010  . PANCREATITIS, ACUTE, HX OF 08/02/2010  . ABDOMINAL PAIN, LEFT UPPER QUADRANT 08/01/2010    Past Surgical History:  Procedure Laterality Date  . ANAL RECTAL MANOMETRY N/A 03/25/2018   Procedure: ANO RECTAL MANOMETRY;  Surgeon: Doran Stabler, MD;  Location: [facility];  Service: Gastroenterology;  Laterality: N/A;  . CARPAL TUNNEL RELEASE Left 10/16/2019   Procedure: LEFT CARPAL TUNNEL RELEASE;  Surgeon: Carole Civil, MD;  Location: [facility];  Service: Orthopedics;  Laterality: Left;  . CESAREAN SECTION     x 2  . CHOLECYSTECTOMY    . EYE SURGERY    . REFRACTIVE SURGERY Bilateral      OB History    Gravida  2   Para  1   Term      Preterm      AB      Living  1     SAB      TAB      Ectopic      Multiple      Live Births  1           Family History  Problem Relation Age of Onset  . Hypertension Other   . Diabetes Mother        type 2  . Hypertension Mother   . Obesity Mother   . Diabetes Brother        type 1  . Renal Disease Brother        renal failure  . Hypertension Brother   . Diabetes Father   . Hypertension Father   . High Cholesterol Father   . Sleep apnea Father   . Obesity Father   . Heart disease Father   . Breast cancer Maternal Aunt 64  . Colon cancer Maternal Aunt   . Uterine cancer Maternal Aunt 65       same as breast cancer  . Uterine cancer Maternal Aunt 61  . Rectal cancer Neg Hx   . Esophageal cancer Neg Hx     Social History   Tobacco Use  . Smoking status: Never Smoker  . Smokeless tobacco: Never Used  Substance Use Topics  . Alcohol use: No  . Drug use: No    Home Medications Prior to Admission medications   Medication Sig Start  Date End Date Taking? Authorizing Provider  Erenumab-aooe (AIMOVIG) 140 MG/ML SOAJ Inject 140 mg as directed every 30 (thirty) days.  02/13/20  Yes [provider]  acetaminophen (TYLENOL) 500 MG tablet Take 1,000 mg by mouth every 6 (six) hours as needed for mild pain or moderate pain.     [provider]  albuterol (PROVENTIL) (2.5 MG/3ML) 0.083% nebulizer solution Take 3 mLs (2.5 mg total) by nebulization every 6 (six) hours as needed for wheezing or shortness of breath. 11/12/18  Delsa Grana, PA-C  albuterol (VENTOLIN HFA) 108 (90 Base) MCG/ACT inhaler Inhale 2 puffs into the lungs every 6 (six) hours as needed for wheezing or shortness of breath. 03/02/20   [name], MD  Botulinum Toxin Type A (BOTOX) 200 units SOLR Inject 155 Units as directed every 3 (three) months.    [provider]  busPIRone (BUSPAR) 15 MG tablet TAKE 2 TABLETS (30 MG TOTAL) BY MOUTH 2 (TWO) TIMES DAILY. 12/18/19   Cottle, Billey Co., MD  carbamazepine (EQUETRO) 200 MG CP12 12 hr capsule Take 2 capsules (400 mg) by mouth in the morning & take 3 capsules (600 mg) by mouth at bedtime. Patient taking differently: Take 400-600 mg by mouth See admin instructions. Take 2 capsules (400 mg) by mouth in the morning & take 3 capsules (600 mg) by mouth at bedtime. 12/18/19   Cottle, Billey Co., MD  chlorhexidine (PERIDEX) 0.12 % solution Use as directed 15 mLs in the mouth or throat 2 (two) times daily. 02/04/20   [provider]  ciclopirox (PENLAC) 8 % solution Apply one coat to each toenail daily. Remove weekly with polish remover. Patient taking differently: Apply 1 application topically See admin instructions. Apply one coat to each toenail daily. Remove weekly with polish remover. 11/04/18   Marzetta Board, DPM  clonazePAM (KLONOPIN) 0.5 MG tablet TAKE 1 TABLET BY MOUTH EVERY 8 HOURS AS NEEDED FOR ANXIETY. 01/19/20   Cottle, Billey Co., MD  EMGALITY 120 MG/ML SOAJ Inject 1 mL into the  skin every 30 (thirty) days. 01/27/20   [provider]  enalapril (VASOTEC) 20 MG tablet Take 20 mg by mouth daily.     [provider]  fluticasone (FLONASE) 50 MCG/ACT nasal spray Place 2 sprays into both nostrils daily. Patient taking differently: Place 2 sprays into both nostrils daily as needed (seasonal allergies.).  11/12/18   Delsa Grana, PA-C  furosemide (LASIX) 40 MG tablet Take 40 mg by mouth every other day.     [provider]  gabapentin (NEURONTIN) 600 MG tablet Take 1 tablet (600 mg total) by mouth 3 (three) times daily. 11/19/19   Cottle, Billey Co., MD  GLUCAGON EMERGENCY 1 MG injection Inject 1 mg into the skin as needed (hypoglycemia.).  04/30/19   [provider]  Insulin Human (INSULIN PUMP) SOLN Inject 1 each into the skin 3 times daily with meals, bedtime and 2 AM. Novolog: 36-38 units per day    [provider]  lamoTRIgine (LAMICTAL) 150 MG tablet TAKE 2 TABLETS (300 MG TOTAL) BY MOUTH 2 (TWO) TIMES DAILY. 12/18/19   Cottle, Billey Co., MD  LATUDA 120 MG TABS TAKE 1 TABLET BY MOUTH EVERY DAY Patient taking differently: Take 120 mg by mouth daily.  12/18/19   Cottle, Billey Co., MD  levonorgestrel (MIRENA, 52 MG,) 20 MCG/24HR IUD 1 each by Intrauterine route once.    [provider]  mometasone-formoterol (DULERA) 100-5 MCG/ACT AERO Inhale 2 puffs into the lungs 2 (two) times daily. 03/15/18   Delsa Grana, PA-C  montelukast (SINGULAIR) 10 MG tablet Take 1 tablet (10 mg total) by mouth at bedtime. 10/25/18   [name], MD  NOVOLOG 100 UNIT/ML injection Inject 36-38 Units into the skin daily. Via insulin pump per sliding scale 10/04/18   [provider]  pantoprazole (PROTONIX) 20 MG tablet TAKE 1 TABLET BY MOUTH DAILY BEFORE BREAKFAST. TAKE AT [place] BREAKFAST Patient taking differently:  Take 20 mg by mouth daily with breakfast.  01/27/20   Willia Craze, NP  Pramipexole Dihydrochloride  0.75 MG TB24 TAKE 1 TABLET BY MOUTH EVERY DAY IN THE EVENING Patient taking differently: Take 0.75 mg by mouth every evening.  02/22/20   Cottle, Billey Co., MD  promethazine (PHENERGAN) 25 MG tablet Take 1 tablet (25 mg total) by mouth every 8 (eight) hours as needed for nausea or vomiting. 03/16/20   Doran Stabler, MD  propranolol (INDERAL) 20 MG tablet Take 1/2-1 tabs po TID for anxiety Patient taking differently: Take 10-20 mg by mouth 3 (three) times daily.  01/03/20   Thayer Headings, PMHNP  Prucalopride Succinate (MOTEGRITY) 2 MG TABS Take 1 tablet (2 mg total) by mouth every morning. 08/06/19   Willia Craze, NP  SUMAtriptan Succinate 3 MG/0.5ML SOAJ Inject 0.5 mLs into the skin See admin instructions. To use as needed for migraines     [provider]  tiZANidine (ZANAFLEX) 4 MG tablet Take 4 mg by mouth 2 (two) times daily as needed for muscle spasms.  12/07/19   [provider]  traZODone (DESYREL) 50 MG tablet Take 1 tablet (50 mg total) by mouth at bedtime. Patient taking differently: Take 50 mg by mouth at bedtime as needed for sleep.  12/03/18   Cottle, Billey Co., MD  Ubrogepant (UBRELVY) 100 MG TABS Take 100 mg by mouth daily as needed (migraine headaches.).     [provider]  Urea 40 % LOTN Apply 1 application topically daily. Apply only to calluses 06/02/19   Trula Slade, DPM  Vitamin D, Ergocalciferol, (DRISDOL) 1.25 MG (50000 UNIT) CAPS capsule Take 1 capsule (50,000 Units total) by mouth every 7 (seven) days. 02/10/20   Briscoe Deutscher, DO    Allergies    Toradol [ketorolac tromethamine] and Ceftin  Review of Systems   Review of Systems  All other systems reviewed and are negative.   Physical Exam Updated Vital Signs BP 108/75 (BP Location: Right Arm)   Pulse 70   Temp 98.2 F (36.8 C) (Oral)   Resp 18   SpO2 100%   Physical Exam Vitals and nursing note reviewed.  Constitutional:      General: She is not in acute  distress.    Appearance: She is well-developed.  HENT:     Head: Atraumatic.     Right Ear: Tympanic membrane normal.     Left Ear: Tympanic membrane normal.     Mouth/Throat:     Mouth: Mucous membranes are moist.  Eyes:     Extraocular Movements: Extraocular movements intact.     Pupils: Pupils are equal, round, and reactive to light.     Comments: Bilateral conjunctiva is mildly injected.  Right eye with mild lateral gaze deviation.  Patient states this is normal for her.  Cardiovascular:     Rate and Rhythm: Normal rate and regular rhythm.     Pulses: Normal pulses.     Heart sounds: Normal heart sounds.  Pulmonary:     Effort: Pulmonary effort is normal.  Abdominal:     Palpations: Abdomen is soft.     Tenderness: There is no abdominal tenderness.  Musculoskeletal:     Cervical back: Normal range of motion and neck supple. No rigidity.     Comments: Able to move all 4 extremities without difficulty.  Lymphadenopathy:     Cervical: No cervical adenopathy.  Skin:    Findings: No rash.  Neurological:     Mental Status: She is alert and oriented to person, place, and time.     GCS: GCS eye subscore is 4. GCS verbal subscore is 5. GCS motor subscore is 6.     Cranial Nerves: Cranial nerves are intact.     Sensory: Sensation is intact.     Motor: Motor function is intact.     Gait: Gait is intact.     ED Results / Procedures / Treatments   Labs (all labs ordered are listed, but only abnormal results are displayed) Labs Reviewed - No data to display  EKG None  Radiology No results found.  Procedures Procedures (including critical care time)  Medications Ordered in ED Medications - No data to display  ED Course  I have reviewed the triage vital signs and the nursing notes.  Pertinent labs & imaging results that were available during my care of the patient were reviewed by me and considered in my medical decision making (see chart for details).    MDM  Rules/Calculators/A&P                      BP (!) 138/91   Pulse 72   Temp 98.2 F (36.8 C) (Oral)   Resp 18   SpO2 100%   Final Clinical Impression(s) / ED Diagnoses Final diagnoses:  Bad headache    Rx / DC Orders ED Discharge Orders    None     5:24 PM Patient with history of migraine here with gradual onset of diffuse throbbing headache ongoing for the past week.  She recently tested positive for COVID-19 several days prior.  Patient is well-appearing, no nuchal rigidity concerning for meningitis.  No acute onset thunderclap headache concerning for subarachnoid hemorrhage, no focal neuro deficit suggestive of stroke or space-occupying lesion.  No concerning rash.  Vital signs stable she is afebrile.  Will provide symptomatic treatment.  7:36 PM Patient report minimal improvement with Benadryl and Phenergan.  Will give droperidol.  Will monitor closely.  8:23 PM Patient does report improvement with droperidol.  On reassessment, no nuchal rigidity.  In the setting of recurrent headache, as well as underlying Covid infection, anticipate patient's headache is related to that and I have low suspicion for meningitis.  Patient otherwise stable for discharge.  Return precaution discussed.   Domenic Moras, PA-C 03/17/20 2031    Quintella Reichert, MD 03/18/20 [phone]

## 2020-03-17 NOTE — Telephone Encounter (Signed)
Left a voicemail to inform patient refills have been sent.

## 2020-03-17 NOTE — ED Triage Notes (Signed)
Pt here for eval of migraine x 7 days with no relief despite taking her normal prescriptions for migraines. Dx with Covid on Saturday.

## 2020-03-17 NOTE — Discharge Instructions (Signed)
Please stay hydrated.  Continue taking your headache medication.  Rest and quarantine due to COVID-19 infection.  Follow up with your neurologist for further management of your headache.  Return if you develop fever, neck stiffness or if you have other concerns.

## 2020-03-17 NOTE — Telephone Encounter (Signed)
Agree, is severe HA, in setting of COVID-19 go to ER

## 2020-03-17 NOTE — Telephone Encounter (Signed)
Received call from patient.   Reports that she has been recently diagnosed with COVID- 03/13/2020. States that she has had severe HA x7 days and current migraine medication is not offering any relief.   Advised to go to ER for evaluation and treatment.   Verbalized understanding.

## 2020-03-18 ENCOUNTER — Ambulatory Visit: Payer: Medicare HMO | Admitting: Orthopaedic Surgery

## 2020-03-19 ENCOUNTER — Ambulatory Visit: Payer: Medicare HMO | Admitting: Obstetrics and Gynecology

## 2020-03-19 ENCOUNTER — Ambulatory Visit: Payer: Medicare HMO

## 2020-03-20 ENCOUNTER — Other Ambulatory Visit (INDEPENDENT_AMBULATORY_CARE_PROVIDER_SITE_OTHER): Payer: Self-pay | Admitting: Family Medicine

## 2020-03-20 DIAGNOSIS — E559 Vitamin D deficiency, unspecified: Secondary | ICD-10-CM

## 2020-03-22 ENCOUNTER — Ambulatory Visit: Payer: Medicare HMO | Admitting: Obstetrics and Gynecology

## 2020-03-22 ENCOUNTER — Ambulatory Visit: Payer: Medicare HMO

## 2020-03-23 ENCOUNTER — Other Ambulatory Visit: Payer: Self-pay | Admitting: Psychiatry

## 2020-03-24 ENCOUNTER — Other Ambulatory Visit: Payer: Self-pay

## 2020-03-24 DIAGNOSIS — F314 Bipolar disorder, current episode depressed, severe, without psychotic features: Secondary | ICD-10-CM

## 2020-03-24 MED ORDER — PRAMIPEXOLE DIHYDROCHLORIDE ER 0.75 MG PO TB24: 0.7500 mg | ORAL_TABLET | Freq: Every evening | ORAL | 3 refills | Status: DC

## 2020-03-25 NOTE — Progress Notes (Signed)
Will get her rescheduled

## 2020-03-29 ENCOUNTER — Ambulatory Visit (INDEPENDENT_AMBULATORY_CARE_PROVIDER_SITE_OTHER): Payer: Medicare HMO | Admitting: Family Medicine

## 2020-03-30 ENCOUNTER — Other Ambulatory Visit: Payer: Self-pay

## 2020-03-31 ENCOUNTER — Ambulatory Visit (INDEPENDENT_AMBULATORY_CARE_PROVIDER_SITE_OTHER): Payer: Medicare Other | Admitting: Licensed Clinical Social Worker

## 2020-03-31 ENCOUNTER — Other Ambulatory Visit: Payer: Self-pay

## 2020-03-31 ENCOUNTER — Other Ambulatory Visit: Payer: Self-pay | Admitting: *Deleted

## 2020-03-31 DIAGNOSIS — F411 Generalized anxiety disorder: Secondary | ICD-10-CM | POA: Diagnosis not present

## 2020-03-31 DIAGNOSIS — F331 Major depressive disorder, recurrent, moderate: Secondary | ICD-10-CM

## 2020-03-31 DIAGNOSIS — J45909 Unspecified asthma, uncomplicated: Secondary | ICD-10-CM

## 2020-03-31 MED ORDER — MOMETASONE FURO-FORMOTEROL FUM 100-5 MCG/ACT IN AERO: 2.0000 | INHALATION_SPRAY | Freq: Two times a day (BID) | RESPIRATORY_TRACT | 11 refills | Status: DC
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
## 2020-03-31 NOTE — Patient Instructions (Signed)
Caring for Your Mental Health Mental health is emotional, psychological, and social well-being. Mental health is just as important as physical health. In fact, mental and physical health are connected, and you need both to be healthy. Some signs of good mental health (well-being) include:  Being able to attend to tasks at home, school, or work.  Being able to manage stress and emotions.  Practicing self-care, which may include: ? A regular exercise pattern. ? A reasonably healthy diet. ? Supportive and trusting relationships. ? The ability to relax and calm yourself (self-calm).  Having pleasurable hobbies and activities to do.  Believing that you have meaning and purpose in your life.  Recovering and adjusting after facing challenges (resilience). You can take steps to build or strengthen these mentally healthy behaviors. There are resources and support to help you with this. Why is caring for mental health important? Caring for your mental health is a big part of staying healthy. Everyone has times when feelings, thoughts, or situations feel overwhelming. Mental health means having the skills to manage what feels overwhelming. If this sense of being overwhelmed persists, however, you might need some help. If you have some of the following signs, you may need to take better care of your mental health or seek help from a health care provider or mental health professional:  Problems with energy or focus.  Changes in eating habits.  Problems sleeping, such as sleeping too much or not enough.  Emotional distress, such as anger, sadness, depression, or anxiety.  Major changes in your relationships.  Losing interest in life or activities that you used to enjoy. If you have any of these symptoms on most days for 2 weeks or longer:  Talk with a close friend or family member about how you are feeling.  Contact your health care provider to discuss your symptoms.  Consider working with a  Education officer, community. Your health care provider, family, or friends may be able to recommend a therapist. What can I do to promote emotional and mental health? Managing emotions  Learn to identify emotions and deal with them. Recognizing your emotions is the first step in learning to deal with them.  Practice ways to appropriately express feelings. Remember that you can control your feelings. They do not control you.  Practice stress management techniques, such as: ? Relaxation techniques, like breathing or muscle relaxation exercises. ? Exercise. Regular activity can lower your stress level. ? Changing what you can change and accepting what you cannot change.  Build up your resilience so that you can recover and adjust after big problems or challenges. Practice resilient behaviors and attitudes: ? Set and focus on long-term goals. ? Develop and maintain healthy, supportive relationships. ? Learn to accept change and make the best of the situation. ? Take care of yourself physically by eating a healthy diet, getting plenty of sleep, and exercising regularly. ? Develop self-awareness. Ask others to give feedback about how they see you. ? Practice mindfulness meditation to help you stay calm when dealing with daily challenges. ? Learn to respond to situations in healthy ways, rather than reacting with your emotions. ? Keep a positive attitude, and believe in yourself. Your view of yourself affects your mental health. ? Develop your listening and empathy skills. These will help you deal with difficult situations and communications.  Remember that emotions can be used as a good source of communication and are a great source of energy. Try to laugh and find humor in life.  Sleeping  Get the right amount and quality of sleep. Sleep has a big impact on physical and mental health. To improve your sleep: ? Go to bed and wake up around the same time every day. ? Limit screen time before  bedtime. This includes the use of your cell phone, TV, computer, and tablet. ? Keep your bedroom dark and cool. Activity   Exercise or do some physical activity regularly. This helps: ? Keep your body strong, especially during times of stress. ? Get rid of chemicals in your body (hormones) that build up when you are stressed. ? Build up your resilience. Eating and drinking   Eat a healthy diet that includes whole grains, vegetables, fresh fruits, and lean proteins. If you have questions about what foods are best for you, ask your health care provider.  Try not to turn to sweet, salty, or otherwise unhealthy foods when you are tired or unhappy. This can lead to unwanted weight gain and is not a healthy way to cope with emotions. Where to find more information You can find more information about how to care for your mental health from:  National Alliance on Mental Illness (NAMI): www.nami.org  National Institute of Mental Health: www.nimh.nih.gov  Centers for Disease Control and Prevention: www.cdc.gov/hrqol/wellbeing.htm Contact a health care provider if:  You lose interest in being with others or you do not want to leave the house.  You have a hard time completing your normal activities or you have less energy than normal.  You cannot stay focused or you have problems with memory.  You feel that your senses are heightened, and this makes you upset or concerned.  You feel nervous or have rapid mood changes.  You are sleeping or eating more or less than normal.  You question reality or you show odd behavior that disturbs you or others. Get help right away if:  You have thoughts about hurting yourself or others. If you ever feel like you may hurt yourself or others, or have thoughts about taking your own life, get help right away. You can go to your nearest emergency department or call:  Your local emergency services (911 in the [place]  A suicide crisis helpline, such as the  National Suicide Prevention Lifeline at 1-800-273-8255. This is open 24 hours a day. Summary  Mental health is not just the absence of mental illness. It involves understanding your emotions and behaviors, and taking steps to cope with them in a healthy way.  If you have symptoms of mental or emotional distress, get help from family, friends, a health care provider, or a mental health professional.  Practice good mental health behaviors such as stress management skills, self-calming skills, exercise, and healthy sleeping and eating. This information is not intended to replace advice given to you by your health care provider. Make sure you discuss any questions you have with your health care provider. Document Revised: 09/07/2017 Document Reviewed: 02/06/2017 Elsevier Patient Education  2020 [employer]  

## 2020-04-06 ENCOUNTER — Other Ambulatory Visit: Payer: Self-pay

## 2020-04-06 ENCOUNTER — Encounter: Payer: Self-pay | Admitting: Obstetrics and Gynecology

## 2020-04-06 ENCOUNTER — Ambulatory Visit (INDEPENDENT_AMBULATORY_CARE_PROVIDER_SITE_OTHER): Payer: Medicare Other

## 2020-04-06 ENCOUNTER — Ambulatory Visit (INDEPENDENT_AMBULATORY_CARE_PROVIDER_SITE_OTHER): Payer: Medicare Other | Admitting: Obstetrics and Gynecology

## 2020-04-06 VITALS — BP 136/90 | Ht 66.0 in | Wt 199.0 lb

## 2020-04-06 DIAGNOSIS — N83202 Unspecified ovarian cyst, left side: Secondary | ICD-10-CM

## 2020-04-07 ENCOUNTER — Ambulatory Visit (INDEPENDENT_AMBULATORY_CARE_PROVIDER_SITE_OTHER): Payer: Medicare Other | Admitting: Family Medicine

## 2020-04-08 DIAGNOSIS — Z634 Disappearance and death of family member: Secondary | ICD-10-CM | POA: Diagnosis not present

## 2020-04-08 DIAGNOSIS — N1832 Chronic kidney disease, stage 3b: Secondary | ICD-10-CM | POA: Diagnosis not present

## 2020-04-08 DIAGNOSIS — E1040 Type 1 diabetes mellitus with diabetic neuropathy, unspecified: Secondary | ICD-10-CM | POA: Diagnosis not present

## 2020-04-08 DIAGNOSIS — E1022 Type 1 diabetes mellitus with diabetic chronic kidney disease: Secondary | ICD-10-CM | POA: Diagnosis not present

## 2020-04-08 DIAGNOSIS — I10 Essential (primary) hypertension: Secondary | ICD-10-CM | POA: Diagnosis not present

## 2020-04-08 DIAGNOSIS — Z794 Long term (current) use of insulin: Secondary | ICD-10-CM | POA: Diagnosis not present

## 2020-04-09 ENCOUNTER — Ambulatory Visit: Payer: Medicare Other | Admitting: Orthopedic Surgery

## 2020-04-14 ENCOUNTER — Other Ambulatory Visit: Payer: Self-pay

## 2020-04-14 ENCOUNTER — Encounter (INDEPENDENT_AMBULATORY_CARE_PROVIDER_SITE_OTHER): Payer: Self-pay | Admitting: Family Medicine

## 2020-04-14 DIAGNOSIS — F4001 Agoraphobia with panic disorder: Secondary | ICD-10-CM

## 2020-04-14 DIAGNOSIS — F430 Acute stress reaction: Secondary | ICD-10-CM

## 2020-04-14 MED ORDER — CLONAZEPAM 0.5 MG PO TABS: 0.5000 mg | ORAL_TABLET | Freq: Three times a day (TID) | ORAL | 0 refills | Status: DC | PRN

## 2020-04-15 ENCOUNTER — Encounter (INDEPENDENT_AMBULATORY_CARE_PROVIDER_SITE_OTHER): Payer: Self-pay | Admitting: Family Medicine

## 2020-04-15 ENCOUNTER — Telehealth (INDEPENDENT_AMBULATORY_CARE_PROVIDER_SITE_OTHER): Payer: Medicare Other | Admitting: Nurse Practitioner

## 2020-04-15 ENCOUNTER — Other Ambulatory Visit: Payer: Self-pay

## 2020-04-15 ENCOUNTER — Ambulatory Visit (INDEPENDENT_AMBULATORY_CARE_PROVIDER_SITE_OTHER): Payer: Medicare Other | Admitting: Family Medicine

## 2020-04-15 VITALS — BP 116/79 | HR 108 | Temp 98.1°F | Ht 66.0 in | Wt 202.0 lb

## 2020-04-15 DIAGNOSIS — F4321 Adjustment disorder with depressed mood: Secondary | ICD-10-CM

## 2020-04-15 DIAGNOSIS — E109 Type 1 diabetes mellitus without complications: Secondary | ICD-10-CM | POA: Diagnosis not present

## 2020-04-15 DIAGNOSIS — J0110 Acute frontal sinusitis, unspecified: Secondary | ICD-10-CM | POA: Diagnosis not present

## 2020-04-15 DIAGNOSIS — E669 Obesity, unspecified: Secondary | ICD-10-CM | POA: Diagnosis not present

## 2020-04-15 DIAGNOSIS — Z6832 Body mass index (BMI) 32.0-32.9, adult: Secondary | ICD-10-CM | POA: Diagnosis not present

## 2020-04-15 MED ORDER — INSULIN PEN NEEDLE 32G X 4 MM MISC: 1.0000 | 0 refills | Status: DC

## 2020-04-15 MED ORDER — AMOXICILLIN-POT CLAVULANATE 875-125 MG PO TABS: 1.0000 | ORAL_TABLET | Freq: Two times a day (BID) | ORAL | 0 refills | Status: DC

## 2020-04-15 MED ORDER — OZEMPIC (0.25 OR 0.5 MG/DOSE) 2 MG/1.5ML ~~LOC~~ SOPN: 0.2500 mg | PEN_INJECTOR | SUBCUTANEOUS | 0 refills | Status: DC

## 2020-04-15 NOTE — Progress Notes (Signed)
Virtual Visit via Telephone Note  I connected with [name] on 04/15/20 at  2:00 PM EDT by telephone and verified that I am speaking with the correct person using two identifiers.   I discussed the limitations, risks, security and privacy concerns of performing an evaluation and management service by telephone and the availability of in person appointments. I also discussed with the patient that there may be a patient responsible charge related to this service. The patient expressed understanding and agreed to proceed.   History of Present Illness: Pt is a 34 year old female presenting for sxs of nasal congestion, nasal discharge, head congestion, sneezing, frontal sinus pressure. The [place], HC S started 2 weeks ago and seemed to get better and then on thursday the nasal discharged increased in production of yellow/green with sinus frontal pressure. She denied fever/chills. Her treatments include tylenol sinus relief, Zyrtec, benadryl, Flonase one spray each nostril daily with minimal relief. She admits she was diagnosed with asymptomatic COVID June 5th having had a test fro a per visit at [facility]. She denied sick contacts or COVID positive exposures. No lost of taste, smell, or headaches. No cough.    Past Medical History:  Diagnosis Date  . Anemia   . Anxiety   . Asthma   . Asthma 11/10/2013  . Back pain   . Bipolar 1 disorder ([place])   . Chest pain   . Chronic renal failure syndrome, stage 3 (moderate)   . Complication of anesthesia   . Constipation   . Depression   . Depression   . Diabetic gastroparesis ([place])   . Diabetic neuropathy, type I diabetes mellitus ([place])   . Edema, lower extremity   . Gallbladder disease   . Gastroparesis   . GERD (gastroesophageal reflux disease)   . Headache(784.0)   . Hypertension   . Joint pain   . Kidney stones   . Palpitations   . Polyneuropathy in diabetes(357.2)   . PONV (postoperative nausea and vomiting)   . 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 Shortness of breath   . Tachycardia    baseline tachycardia   . 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 ANAL RECTAL MANOMETRY N/A 03/25/2018   Procedure: ANO RECTAL MANOMETRY;  Surgeon: Doran Stabler, MD;  Location: [facility];  Service: Gastroenterology;  Laterality: N/A;  . CARPAL TUNNEL RELEASE Left 10/16/2019   Procedure: LEFT CARPAL TUNNEL RELEASE;  Surgeon: Carole Civil, MD;  Location: [facility] ORS;  Service: Orthopedics;  Laterality: Left;  . CESAREAN SECTION     x 2  . CHOLECYSTECTOMY    . EYE SURGERY    . REFRACTIVE SURGERY Bilateral    Current Outpatient Medications on File Prior to Visit  Medication Sig Dispense Refill  . acetaminophen (TYLENOL) 500 MG tablet Take 1,000 mg by mouth every 6 (six) hours as needed for mild pain or moderate pain.     Marland Kitchen albuterol (PROVENTIL) (2.5 MG/3ML) 0.083% nebulizer solution Take 3 mLs (2.5 mg total) by nebulization every 6 (six) hours as needed for wheezing or shortness of breath. 150 mL 1  . albuterol (VENTOLIN HFA) 108 (90 Base) MCG/ACT inhaler Inhale 2 puffs into the lungs every 6 (six) hours as needed for wheezing or shortness of breath. 16 g 2  . Botulinum Toxin Type A (BOTOX) 200 units SOLR Inject 155 Units as directed every  3 (three) months.    . busPIRone (BUSPAR) 15 MG tablet TAKE 2 TABLETS (30 MG TOTAL) BY MOUTH 2 (TWO) TIMES DAILY. 120 tablet 1  . carbamazepine (EQUETRO) 200 MG CP12 12 hr capsule Take 2 capsules (400 mg) by mouth in the morning & take 3 capsules (600 mg) by mouth at bedtime. (Patient taking differently: Take 400-600 mg by mouth See admin instructions. Take 2 capsules (400 mg) by mouth in the morning & take 3 capsules (600 mg) by mouth at bedtime.) 150 capsule 2  . chlorhexidine (PERIDEX) 0.12 % solution Use as directed 15 mLs in the mouth or throat 2 (two) times daily.    . ciclopirox  (PENLAC) 8 % solution Apply one coat to each toenail daily. Remove weekly with polish remover. (Patient taking differently: Apply 1 application topically See admin instructions. Apply one coat to each toenail daily. Remove weekly with polish remover.) 6.6 mL 11  . clonazePAM (KLONOPIN) 0.5 MG tablet Take 1 tablet (0.5 mg total) by mouth every 8 (eight) hours as needed. for anxiety 20 tablet 0  . EMGALITY 120 MG/ML SOAJ Inject 1 mL into the skin every 30 (thirty) days.    . enalapril (VASOTEC) 20 MG tablet Take 20 mg by mouth daily.     [name] (AIMOVIG) 140 MG/ML SOAJ Inject 140 mg as directed every 30 (thirty) days.     . fluticasone (FLONASE) 50 MCG/ACT nasal spray Place 2 sprays into both nostrils daily. (Patient taking differently: Place 2 sprays into both nostrils daily as needed (seasonal allergies.). ) 16 g 2  . furosemide (LASIX) 40 MG tablet Take 40 mg by mouth every other day.     . gabapentin (NEURONTIN) 600 MG tablet Take 1 tablet (600 mg total) by mouth 3 (three) times daily. 90 tablet 2  . GLUCAGON EMERGENCY 1 MG injection Inject 1 mg into the skin as needed (hypoglycemia.).     . Insulin Human (INSULIN PUMP) SOLN Inject 1 each into the skin 3 times daily with meals, bedtime and 2 AM. Novolog: 36-38 units per day    . Insulin Pen Needle 32G X 4 MM MISC 1 each by Does not apply route once a week. 50 each 0  . lamoTRIgine (LAMICTAL) 150 MG tablet TAKE 2 TABLETS (300 MG TOTAL) BY MOUTH 2 (TWO) TIMES DAILY. 120 tablet 2  . LATUDA 120 MG TABS TAKE 1 TABLET BY MOUTH EVERY DAY 30 tablet 5  . levonorgestrel (MIRENA, 52 MG,) 20 MCG/24HR IUD 1 each by Intrauterine route once.    . mometasone-formoterol (DULERA) 100-5 MCG/ACT AERO Inhale 2 puffs into the lungs 2 (two) times daily. 13 g 11  . montelukast (SINGULAIR) 10 MG tablet Take 1 tablet (10 mg total) by mouth at bedtime. 90 tablet 3  . NOVOLOG 100 UNIT/ML injection Inject 36-38 Units into the skin daily. Via insulin pump per sliding  scale    . pantoprazole (PROTONIX) 20 MG tablet TAKE 1 TABLET BY MOUTH DAILY BEFORE BREAKFAST. TAKE AT LEAST 30 MINUTES BEFORE BREAKFAST (Patient taking differently: Take 20 mg by mouth daily with breakfast. ) 30 tablet 3  . Pramipexole Dihydrochloride 0.75 MG TB24 Take 1 tablet (0.75 mg total) by mouth every evening. 30 tablet 3  . promethazine (PHENERGAN) 25 MG tablet Take 1 tablet (25 mg total) by mouth every 8 (eight) hours as needed for nausea or vomiting. 30 tablet 0  . propranolol (INDERAL) 20 MG tablet Take 1/2-1 tabs po TID for anxiety (Patient taking  differently: Take 10-20 mg by mouth 3 (three) times daily. ) 90 tablet 1  . Prucalopride Succinate (MOTEGRITY) 2 MG TABS Take 1 tablet (2 mg total) by mouth every morning. 30 tablet 1  . Semaglutide,0.25 or 0.5MG/DOS, (OZEMPIC, 0.25 OR 0.5 MG/DOSE,) 2 MG/1.5ML SOPN Inject 0.1875 mLs (0.25 mg total) into the skin once a week. 4 pen 0  . SUMAtriptan Succinate 3 MG/0.5ML SOAJ Inject 0.5 mLs into the skin See admin instructions. To use as needed for migraines     . tiZANidine (ZANAFLEX) 4 MG tablet Take 4 mg by mouth 2 (two) times daily as needed for muscle spasms.     . traZODone (DESYREL) 50 MG tablet Take 1 tablet (50 mg total) by mouth at bedtime. (Patient taking differently: Take 50 mg by mouth at bedtime as needed for sleep. ) 30 tablet 2  . Ubrogepant (UBRELVY) 100 MG TABS Take 100 mg by mouth daily as needed (migraine headaches.).     Marland Kitchen Urea 40 % LOTN Apply 1 application topically daily. Apply only to calluses 325 mL 0  . Vitamin D, Ergocalciferol, (DRISDOL) 1.25 MG (50000 UNIT) CAPS capsule TAKE 1 CAPSULE (50,000 UNITS TOTAL) BY MOUTH EVERY 7 (SEVEN) DAYS. 4 capsule 0   No current facility-administered medications on file prior to visit.   Allergies  Allergen Reactions  . Toradol [Ketorolac Tromethamine]     Cannot take d/t kidney issues   . Ceftin Rash   Social History   Socioeconomic History  . Marital status: Legally Separated     Spouse name: Not on file  . Number of children: 2  . Years of education: Not on file  . Highest education level: Not on file  Occupational History  . Occupation: Home maker  Tobacco Use  . Smoking status: Never Smoker  . Smokeless tobacco: Never Used  Vaping Use  . Vaping Use: Never used  Substance and Sexual Activity  . Alcohol use: No  . Drug use: No  . Sexual activity: Yes    Partners: Male    Birth control/protection: I.U.D.  Other Topics Concern  . Not on file  Social History Narrative   Pt has a daughter and boyfriend.    Dad passed away suddenly heart attack 12/2019   Social Determinants of Health   Financial Resource Strain:   . Difficulty of Paying Living Expenses:   Food Insecurity:   . Worried About Charity fundraiser in the Last Year:   . Arboriculturist in the Last Year:   Transportation Needs:   . Film/video editor (Medical):   Marland Kitchen Lack of Transportation (Non-Medical):   Physical Activity:   . Days of Exercise per Week:   . Minutes of Exercise per Session:   Stress:   . Feeling of Stress :   Social Connections:   . Frequency of Communication with Friends and Family:   . Frequency of Social Gatherings with Friends and Family:   . Attends Religious Services:   . Active Member of Clubs or Organizations:   . Attends Archivist Meetings:   Marland Kitchen Marital Status:   Intimate Partner Violence:   . Fear of Current or Ex-Partner:   . Emotionally Abused:   Marland Kitchen Physically Abused:   . Sexually Abused:    Breast Cancer-relatedfamily history includes Breast cancer (age of onset: 7) in her maternal aunt. Immunization History  Administered Date(s) Administered  . DTaP 03/05/1985, 05/18/1985, 07/14/1985, 02/15/1987, 03/07/1990  . Hepatitis B 07/30/1996, 08/27/1996,  01/28/1997  . Hepatitis B, adult 07/30/1996, 08/27/1996, 01/28/1997  . IPV 03/05/1985, 05/14/1985, 02/15/1987, 03/07/1990  . Influenza,inj,Quad PF,6+ Mos 08/25/2015, 06/25/2016  .  Influenza-Unspecified 07/05/2016, 07/28/2017, 05/23/2018  . MMR 02/23/1986, 06/03/2011, 08/10/2016  . Pneumococcal Conjugate-13 09/13/2017  . Pneumococcal Polysaccharide-23 06/03/2011, 08/10/2016  . Td 06/16/1997  . Tdap 02/18/2009, 07/07/2016      Observations/Objective: A/ox4, able to speak full and complete sentences without shortness of breath  Assessment and Plan: Acute non-recurrent frontal sinusitis - Plan: amoxicillin-clavulanate (AUGMENTIN) 875-125 MG tablet  Sxs consistent with frontal sinusitis: Augmentin: pt with 10 year ago h/o rash with Ceftin however she admits she has used PCN specifically Augmentin in the past without any allergic reaction.   Pt using daily one spray Flonase instructed to increase two sprays each nostril daily for 5 days then resume prior dose.   Drink plenty of water and get plenty of rest  Seek medical attention for non resolving sxs  Consider COVID testing for continued, worsening or non resolving sxs   Follow Up Instructions:    I discussed the assessment and treatment plan with the patient. The patient was provided an opportunity to ask questions and all were answered. The patient agreed with the plan and demonstrated an understanding of the instructions.   The patient was advised to call back or seek an in-person evaluation if the symptoms worsen or if the condition fails to improve as anticipated.  I provided 10 minutes of non-face-to-face time during this encounter. End time 2:10 PM  Annie Main, FNP

## 2020-04-15 NOTE — Progress Notes (Signed)
Chief Complaint:   OBESITY Rosalind is here to discuss her progress with her obesity treatment plan along with follow-up of her obesity related diagnoses. Khori is on keeping a food journal and adhering to recommended goals of 1500 calories and 75 grams of protein and states she is following her eating plan approximately 0% of the time. Javia states she is walking 5000 steps 5 times per week.  Today's visit was #: 6 Starting weight: 199 lbs Starting date: 11/13/2019 Today's weight: 202 lbs Today's date: 04/15/2020 Total lbs lost to date: 0 Total lbs lost since last in-office visit: 0  Interim History: Anika says she has been doing more emotional eating.  Nighttime is the worst.  She has had increased stress and anxiety.  Subjective:   1. Type 1 diabetes mellitus without complication (HCC) Medications reviewed. Diabetic ROS: no polyuria or polydipsia, no chest pain, dyspnea or TIA's, no numbness, tingling or pain in extremities.   Lab Results  Component Value Date   HGBA1C 8.0 (H) 09/09/2019   HGBA1C 9.0 04/05/2017   HGBA1C 11.2 (H) 11/24/2015   Lab Results  Component Value Date   LDLCALC 68 04/22/2014   CREATININE 1.54 (H) 03/01/2020   2. Situational depression Ahaana is struggling with family stress, grief and PTSD after father's recent passing, as well as financial stressors. No active SI,HI.  Assessment/Plan:   1. Type 1 diabetes mellitus without complication (HCC) Good blood sugar control is important to decrease the likelihood of diabetic complications such as nephropathy, neuropathy, limb loss, blindness, coronary artery disease, and death. Intensive lifestyle modification including diet, exercise and weight loss are the first line of treatment for diabetes.   Orders - Semaglutide,0.25 or 0.5MG /DOS, (OZEMPIC, 0.25 OR 0.5 MG/DOSE,) 2 MG/1.5ML SOPN; Inject 0.1875 mLs (0.25 mg total) into the skin once a week.  Dispense: 4 pen; Refill: 0  2. Situational  depression This problem is improving. Followed by Psychology for this problem. Those encounter notes were reviewed. We will continue to monitor. Orders and follow up as documented in patient record.  3. Class 1 obesity with serious comorbidity and body mass index (BMI) of 32.0 to 32.9 in adult, unspecified obesity type Gennesis is currently in the action stage of change. As such, her goal is to continue with weight loss efforts. She has agreed to keeping a food journal and adhering to recommended goals of 1500 calories and 75 grams of protein and following a lower carbohydrate, vegetable and lean protein rich diet plan.   Exercise goals: For substantial health benefits, adults should do at least 150 minutes (2 hours and 30 minutes) a week of moderate-intensity, or 75 minutes (1 hour and 15 minutes) a week of vigorous-intensity aerobic physical activity, or an equivalent combination of moderate- and vigorous-intensity aerobic activity. Aerobic activity should be performed in episodes of at least 10 minutes, and preferably, it should be spread throughout the week.  Behavioral modification strategies: increasing water intake.  Mila has agreed to follow-up with our clinic in 2-3 weeks. She was informed of the importance of frequent follow-up visits to maximize her success with intensive lifestyle modifications for her multiple health conditions.   Danyia was informed we would discuss her lab results at her next visit unless there is a critical issue that needs to be addressed sooner. Kamariya agreed to keep her next visit at the agreed upon time to discuss these results.  Objective:   Blood pressure 116/79, pulse (!) 108, temperature 98.1 F (36.7 C),  temperature source Oral, height 5\' 6"  (1.676 m), weight 202 lb (91.6 kg), SpO2 99 %. Body mass index is 32.6 kg/m.  General: Cooperative, alert, well developed, in no acute distress. HEENT: Conjunctivae and lids unremarkable. Cardiovascular:  Regular rhythm.  Lungs: Normal work of breathing. Neurologic: No focal deficits.   Lab Results  Component Value Date   CREATININE 1.54 (H) 03/01/2020   BUN 29 (H) 03/01/2020   NA 137 03/01/2020   K 4.8 03/01/2020   CL 101 03/01/2020   CO2 24 03/01/2020   Lab Results  Component Value Date   ALT 21 03/01/2020   AST 23 03/01/2020   ALKPHOS 134 (H) 03/01/2020   BILITOT 0.3 03/01/2020   Lab Results  Component Value Date   HGBA1C 8.0 (H) 09/09/2019   HGBA1C 9.0 04/05/2017   HGBA1C 11.2 (H) 11/24/2015   HGBA1C 13.3 (H) 04/22/2014   HGBA1C 10.7 (H) 10/11/2013   Lab Results  Component Value Date   TSH 0.859 11/13/2019   Lab Results  Component Value Date   CHOL 149 04/22/2014   HDL 71 04/22/2014   LDLCALC 68 04/22/2014   TRIG 49 04/22/2014   CHOLHDL 2.1 04/22/2014   Lab Results  Component Value Date   WBC 8.2 03/01/2020   HGB 12.7 03/01/2020   HCT 37.9 03/01/2020   MCV 90 03/01/2020   PLT 257 03/01/2020   Lab Results  Component Value Date   IRON 41 11/13/2019   TIBC 267 11/13/2019   FERRITIN 77 11/13/2019   Obesity Behavioral Intervention:   Approximately 15 minutes were spent on the discussion below.  ASK: We discussed the diagnosis of obesity with Reba today and Arlena agreed to give [name] permission to discuss obesity behavioral modification therapy today.  ASSESS: Ericia has the diagnosis of obesity and her BMI today is 32.6. Ceira is in the action stage of change.   ADVISE: Bridgett was educated on the multiple health risks of obesity as well as the benefit of weight loss to improve her health. She was advised of the need for long term treatment and the importance of lifestyle modifications to improve her current health and to decrease her risk of future health problems.  AGREE: Multiple dietary modification options and treatment options were discussed and Floria agreed to follow the recommendations documented in the above note.  ARRANGE: Amel  was educated on the importance of frequent visits to treat obesity as outlined per CMS and USPSTF guidelines and agreed to schedule her next follow up appointment today.  Attestation Statements:   Reviewed by clinician on day of visit: allergies, medications, problem list, medical history, surgical history, family history, social history, and previous encounter notes.  I, Water quality scientist, CMA, am acting as transcriptionist for Briscoe Deutscher, DO  I have reviewed the above documentation for accuracy and completeness, and I agree with the above. Briscoe Deutscher, DO

## 2020-04-15 NOTE — Patient Instructions (Addendum)
Get rest  Drink plenty of water  May use humidifier  May use over the counter medication for symptom relief such as decongestants, saline nasal spray, mucous relief, tylenol and ibuprofen.   Sxs consistent with frontal sinusitis: Augmentin: pt with 10 year ago h/o rash with Ceftin however she admits she has used PCN specifically Augmentin in the past without any allergic reaction.   Pt using daily one spray Flonase instructed to increase two sprays each nostril daily for 5 days then resume prior dose.   Seek medical attention for non resolving sxs  Consider COVID testing for continued, worsening or non resolving sxs

## 2020-04-15 NOTE — Progress Notes (Signed)
Gynecology Ultrasound Follow Up  Chief Complaint:  Chief Complaint  Patient presents with  . Follow-up    GYN Ultrasound     History of Present Illness: Patient is a 34 y.o. female who presents today for ultrasound evaluation of left ovarian cyst.  Ultrasound demonstrates the following findgins Adnexa: no masses seen  Uterus: Non-enlarged with endometrial stripe without focal abnormalities Additional: no free fluid  Review of Systems: Review of Systems  Constitutional: Negative.   Gastrointestinal: Negative.   Genitourinary: Negative.     Past Medical History:  Past Medical History:  Diagnosis Date  . Anemia   . Anxiety   . Asthma   . Asthma 11/10/2013  . Back pain   . Bipolar 1 disorder ([place])   . Chest pain   . Chronic renal failure syndrome, stage 3 (moderate)   . Complication of anesthesia   . Constipation   . Depression   . Depression   . Diabetic gastroparesis ([place])   . Diabetic neuropathy, type I diabetes mellitus ([place])   . Edema, lower extremity   . Gallbladder disease   . Gastroparesis   . GERD (gastroesophageal reflux disease)   . Headache(784.0)   . Hypertension   . Joint pain   . Kidney stones   . Palpitations   . Polyneuropathy in diabetes(357.2)   . PONV (postoperative nausea and vomiting)   . 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 Shortness of breath   . Tachycardia    baseline tachycardia   . 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 ANAL RECTAL MANOMETRY N/A 03/25/2018   Procedure: ANO RECTAL MANOMETRY;  Surgeon: Doran Stabler, MD;  Location: [facility];  Service: Gastroenterology;  Laterality: N/A;  . CARPAL TUNNEL RELEASE Left 10/16/2019   Procedure: LEFT CARPAL TUNNEL RELEASE;  Surgeon: Carole Civil, MD;  Location: [facility] OR[facility];  Service: Orthopedics;  Laterality: Left;  . CESAREAN SECTION     x 2  .  CHOLECYSTECTOMY    . EYE SURGERY    . REFRACTIVE SURGERY Bilateral     Gynecologic History:  No LMP recorded. (Menstrual status: IUD).   Family History:  Family History  Problem Relation Age of Onset  . Hypertension Other   . Diabetes Mother        type 2  . Hypertension Mother   . Obesity Mother   . Diabetes Brother        type 1  . Renal Disease Brother        renal failure  . Hypertension Brother   . Diabetes Father   . Hypertension Father   . High Cholesterol Father   . Sleep apnea Father   . Obesity Father   . Heart disease Father   . Breast cancer Maternal Aunt 64  . Colon cancer Maternal Aunt   . Uterine cancer Maternal Aunt 65       same as breast cancer  . Uterine cancer Maternal Aunt 61  . Rectal cancer Neg Hx   . Esophageal cancer Neg Hx     Social History:  Social History   Socioeconomic History  . Marital status: Legally Separated    Spouse name: Not on file  . Number of children: 2  . Years of education: Not on file  . Highest education level: Not on file  Occupational History  .  Occupation: Home maker  Tobacco Use  . Smoking status: Never Smoker  . Smokeless tobacco: Never Used  Vaping Use  . Vaping Use: Never used  Substance and Sexual Activity  . Alcohol use: No  . Drug use: No  . Sexual activity: Yes    Partners: Male    Birth control/protection: I.U.D.  Other Topics Concern  . Not on file  Social History Narrative   Pt has a daughter and boyfriend.    Dad passed away suddenly heart attack 12/2019   Social Determinants of Health   Financial Resource Strain:   . Difficulty of Paying Living Expenses:   Food Insecurity:   . Worried About Charity fundraiser in the Last Year:   . Arboriculturist in the Last Year:   Transportation Needs:   . Film/video editor (Medical):   Marland Kitchen Lack of Transportation (Non-Medical):   Physical Activity:   . Days of Exercise per Week:   . Minutes of Exercise per Session:   Stress:   . Feeling of  Stress :   Social Connections:   . Frequency of Communication with Friends and Family:   . Frequency of Social Gatherings with Friends and Family:   . Attends Religious Services:   . Active Member of Clubs or Organizations:   . Attends Archivist Meetings:   Marland Kitchen Marital Status:   Intimate Partner Violence:   . Fear of Current or Ex-Partner:   . Emotionally Abused:   Marland Kitchen Physically Abused:   . Sexually Abused:     Allergies:  Allergies  Allergen Reactions  . Toradol [Ketorolac Tromethamine]     Cannot take d/t kidney issues   . Ceftin Rash    Medications: Prior to Admission medications   Medication Sig Start Date End Date Taking? Authorizing Provider  acetaminophen (TYLENOL) 500 MG tablet Take 1,000 mg by mouth every 6 (six) hours as needed for mild pain or moderate pain.     [provider]  albuterol (PROVENTIL) (2.5 MG/3ML) 0.083% nebulizer solution Take 3 mLs (2.5 mg total) by nebulization every 6 (six) hours as needed for wheezing or shortness of breath. 11/12/18   Delsa Grana, PA-C  albuterol (VENTOLIN HFA) 108 (90 Base) MCG/ACT inhaler Inhale 2 puffs into the lungs every 6 (six) hours as needed for wheezing or shortness of breath. 03/02/20   [name], MD  amoxicillin-clavulanate (AUGMENTIN) 875-125 MG tablet Take 1 tablet by mouth 2 (two) times daily. 04/15/20   Annie Main, FNP  Botulinum Toxin Type A (BOTOX) 200 units SOLR Inject 155 Units as directed every 3 (three) months.    [provider]  busPIRone (BUSPAR) 15 MG tablet TAKE 2 TABLETS (30 MG TOTAL) BY MOUTH 2 (TWO) TIMES DAILY. 12/18/19   Cottle, Billey Co., MD  carbamazepine (EQUETRO) 200 MG CP12 12 hr capsule Take 2 capsules (400 mg) by mouth in the morning & take 3 capsules (600 mg) by mouth at bedtime. Patient taking differently: Take 400-600 mg by mouth See admin instructions. Take 2 capsules (400 mg) by mouth in the morning & take 3 capsules (600 mg) by mouth at bedtime. 12/18/19    Cottle, Billey Co., MD  chlorhexidine (PERIDEX) 0.12 % solution Use as directed 15 mLs in the mouth or throat 2 (two) times daily. 02/04/20   [provider]  ciclopirox (PENLAC) 8 % solution Apply one coat to each toenail daily. Remove weekly with polish remover. Patient taking differently:  Apply 1 application topically See admin instructions. Apply one coat to each toenail daily. Remove weekly with polish remover. 11/04/18   Marzetta Board, DPM  clonazePAM (KLONOPIN) 0.5 MG tablet Take 1 tablet (0.5 mg total) by mouth every 8 (eight) hours as needed. for anxiety 04/14/20   Donnal Moat T, PA-C  EMGALITY 120 MG/ML SOAJ Inject 1 mL into the skin every 30 (thirty) days. 01/27/20   [provider]  enalapril (VASOTEC) 20 MG tablet Take 20 mg by mouth daily.     [provider]  Erenumab-aooe (AIMOVIG) 140 MG/ML SOAJ Inject 140 mg as directed every 30 (thirty) days.  02/13/20   [provider]  fluticasone (FLONASE) 50 MCG/ACT nasal spray Place 2 sprays into both nostrils daily. Patient taking differently: Place 2 sprays into both nostrils daily as needed (seasonal allergies.).  11/12/18   Delsa Grana, PA-C  furosemide (LASIX) 40 MG tablet Take 40 mg by mouth every other day.     [provider]  gabapentin (NEURONTIN) 600 MG tablet Take 1 tablet (600 mg total) by mouth 3 (three) times daily. 11/19/19   Cottle, Billey Co., MD  GLUCAGON EMERGENCY 1 MG injection Inject 1 mg into the skin as needed (hypoglycemia.).  04/30/19   [provider]  Insulin Human (INSULIN PUMP) SOLN Inject 1 each into the skin 3 times daily with meals, bedtime and 2 AM. Novolog: 36-38 units per day    [provider]  Insulin Pen Needle 32G X 4 MM MISC 1 each by Does not apply route once a week. 04/15/20   Briscoe Deutscher, DO  lamoTRIgine (LAMICTAL) 150 MG tablet TAKE 2 TABLETS (300 MG TOTAL) BY MOUTH 2 (TWO) TIMES DAILY. 12/18/19   Cottle, Billey Co., MD  LATUDA 120 MG  TABS TAKE 1 TABLET BY MOUTH EVERY DAY 03/23/20   Cottle, Billey Co., MD  levonorgestrel (MIRENA, 52 MG,) 20 MCG/24HR IUD 1 each by Intrauterine route once.    [provider]  mometasone-formoterol (DULERA) 100-5 MCG/ACT AERO Inhale 2 puffs into the lungs 2 (two) times daily. 03/31/20   [name], MD  montelukast (SINGULAIR) 10 MG tablet Take 1 tablet (10 mg total) by mouth at bedtime. 10/25/18   [name], MD  NOVOLOG 100 UNIT/ML injection Inject 36-38 Units into the skin daily. Via insulin pump per sliding scale 10/04/18   [provider]  pantoprazole (PROTONIX) 20 MG tablet TAKE 1 TABLET BY MOUTH DAILY BEFORE BREAKFAST. TAKE AT LEAST 30 MINUTES BEFORE BREAKFAST Patient taking differently: Take 20 mg by mouth daily with breakfast.  01/27/20   Willia Craze, NP  Pramipexole Dihydrochloride 0.75 MG TB24 Take 1 tablet (0.75 mg total) by mouth every evening. 03/24/20   Cottle, Billey Co., MD  promethazine (PHENERGAN) 25 MG tablet Take 1 tablet (25 mg total) by mouth every 8 (eight) hours as needed for nausea or vomiting. 03/16/20   Doran Stabler, MD  propranolol (INDERAL) 20 MG tablet Take 1/2-1 tabs po TID for anxiety Patient taking differently: Take 10-20 mg by mouth 3 (three) times daily.  01/03/20   Thayer Headings, PMHNP  Prucalopride Succinate (MOTEGRITY) 2 MG TABS Take 1 tablet (2 mg total) by mouth every morning. 08/06/19   Willia Craze, NP  Semaglutide,0.25 or 0.5MG /DOS, (OZEMPIC, 0.25 OR 0.5 MG/DOSE,) 2 MG/1.5ML SOPN Inject 0.1875 mLs (0.25 mg total) into the skin once a week. 04/15/20   Briscoe Deutscher, DO  SUMAtriptan Succinate  3 MG/0.5ML SOAJ Inject 0.5 mLs into the skin See admin instructions. To use as needed for migraines     [provider]  tiZANidine (ZANAFLEX) 4 MG tablet Take 4 mg by mouth 2 (two) times daily as needed for muscle spasms.  12/07/19   [provider]  traZODone (DESYREL) 50 MG tablet Take 1 tablet (50 mg total)  by mouth at bedtime. Patient taking differently: Take 50 mg by mouth at bedtime as needed for sleep.  12/03/18   Cottle, Billey Co., MD  Ubrogepant (UBRELVY) 100 MG TABS Take 100 mg by mouth daily as needed (migraine headaches.).     [provider]  Urea 40 % LOTN Apply 1 application topically daily. Apply only to calluses 06/02/19   Trula Slade, DPM  Vitamin D, Ergocalciferol, (DRISDOL) 1.25 MG (50000 UNIT) CAPS capsule TAKE 1 CAPSULE (50,000 UNITS TOTAL) BY MOUTH EVERY 7 (SEVEN) DAYS. 03/22/20   Briscoe Deutscher, DO    Physical Exam Vitals: Blood pressure 136/90, height 5\' 6"  (1.676 m), weight 199 lb (90.3 kg).  General: NAD HEENT: normocephalic, anicteric Pulmonary: No increased work of breathing Extremities: no edema, erythema, or tenderness Neurologic: Grossly intact, normal gait Psychiatric: mood appropriate, affect full  US Transvaginal Non-OB  Result Date: 04/06/2020 Patient Name: [name] DOB: 1986-05-05 MRN: [id number] ULTRASOUND REPORT Location: [facility] Date of Service: 04/06/2020 Indications: follow up left ovarian cyst Findings: The uterus is anteverted. Echo texture is homogenous without evidence of focal masses. The IUD is correctly placed within the uterus. Right Ovary measures 4.1 x 2.6 x 2.7 cm. It is normal in appearance. Left Ovary measures 4.1 x 3.3 x 1.8 cm. It is normal in appearance. Survey of the adnexa demonstrates no adnexal masses. There is no free fluid in the cul de sac. Impression: 1. Normal pelvic ultrasound. Recommendations: 1.Clinical correlation with the patient's History and Physical Exam. Gweneth Dimitri, RT Images reviewed.  Normal GYN study without visualized pathology.  Malachy Mood, MD, [facility], [facility] 04/06/2020, 6:20 PM    Assessment: 34 y.o. G2P1 follow up left ovarian cyst  Plan: Problem List Items Addressed This Visit    None    Visit Diagnoses    Left ovarian cyst    -  Primary       1) Left ovarian cyst - interval resolution of previously imaged left ovarian cyst, no residual symptoms.  2) A total of 15 minutes were spent in face-to-face contact with the patient during this encounter with over half of that time devoted to counseling and coordination of care.  3) Return in about 8 months (around 12/06/2020) for annual.    Malachy Mood, MD, [place], [place]

## 2020-04-16 DIAGNOSIS — E1022 Type 1 diabetes mellitus with diabetic chronic kidney disease: Secondary | ICD-10-CM | POA: Diagnosis not present

## 2020-04-16 DIAGNOSIS — E1021 Type 1 diabetes mellitus with diabetic nephropathy: Secondary | ICD-10-CM | POA: Diagnosis not present

## 2020-04-16 DIAGNOSIS — N1832 Chronic kidney disease, stage 3b: Secondary | ICD-10-CM | POA: Diagnosis not present

## 2020-04-16 DIAGNOSIS — Z975 Presence of (intrauterine) contraceptive device: Secondary | ICD-10-CM | POA: Diagnosis not present

## 2020-04-16 DIAGNOSIS — I129 Hypertensive chronic kidney disease with stage 1 through stage 4 chronic kidney disease, or unspecified chronic kidney disease: Secondary | ICD-10-CM | POA: Diagnosis not present

## 2020-04-16 LAB — HEMOGLOBIN A1C
Est. average glucose Bld gHb Est-mCnc: 226 mg/dL
Hgb A1c MFr Bld: 9.5 % — ABNORMAL HIGH (ref 4.8–5.6)

## 2020-04-16 LAB — CBC WITH DIFFERENTIAL/PLATELET
Basophils Absolute: 0.1 10*3/uL (ref 0.0–0.2)
Basos: 1 %
EOS (ABSOLUTE): 0.6 10*3/uL — ABNORMAL HIGH (ref 0.0–0.4)
Eos: 10 %
Hematocrit: 39.7 % (ref 34.0–46.6)
Hemoglobin: 13.2 g/dL (ref 11.1–15.9)
Immature Grans (Abs): 0.1 10*3/uL (ref 0.0–0.1)
Immature Granulocytes: 1 %
Lymphocytes Absolute: 1.7 10*3/uL (ref 0.7–3.1)
Lymphs: 28 %
MCH: 29.5 pg (ref 26.6–33.0)
MCHC: 33.2 g/dL (ref 31.5–35.7)
MCV: 89 fL (ref 79–97)
Monocytes Absolute: 0.7 10*3/uL (ref 0.1–0.9)
Monocytes: 12 %
Neutrophils Absolute: 2.9 10*3/uL (ref 1.4–7.0)
Neutrophils: 48 %
Platelets: 256 10*3/uL (ref 150–450)
RBC: 4.48 x10E6/uL (ref 3.77–5.28)
RDW: 12.3 % (ref 11.7–15.4)
WBC: 6.1 10*3/uL (ref 3.4–10.8)

## 2020-04-16 LAB — COMPREHENSIVE METABOLIC PANEL
ALT: 11 IU/L (ref 0–32)
AST: 17 IU/L (ref 0–40)
Albumin/Globulin Ratio: 1.5 (ref 1.2–2.2)
Albumin: 4.1 g/dL (ref 3.8–4.8)
Alkaline Phosphatase: 150 IU/L — ABNORMAL HIGH (ref 48–121)
BUN/Creatinine Ratio: 16 (ref 9–23)
BUN: 26 mg/dL — ABNORMAL HIGH (ref 6–20)
Bilirubin Total: <0.2 mg/dL (ref 0.0–1.2)
CO2: 26 mmol/L (ref 20–29)
Calcium: 8.9 mg/dL (ref 8.7–10.2)
Chloride: 96 mmol/L (ref 96–106)
Creatinine, Ser: 1.66 mg/dL — ABNORMAL HIGH (ref 0.57–1.00)
GFR calc Af Amer: 46 mL/min/{1.73_m2} — ABNORMAL LOW (ref >59)
GFR calc non Af Amer: 40 mL/min/{1.73_m2} — ABNORMAL LOW (ref >59)
Globulin, Total: 2.7 g/dL (ref 1.5–4.5)
Glucose: 155 mg/dL — ABNORMAL HIGH (ref 65–99)
Potassium: 4.6 mmol/L (ref 3.5–5.2)
Sodium: 141 mmol/L (ref 134–144)
Total Protein: 6.8 g/dL (ref 6.0–8.5)

## 2020-04-16 LAB — TSH: TSH: 1.53 u[IU]/mL (ref 0.450–4.500)

## 2020-04-16 LAB — T4, FREE: Free T4: 1.25 ng/dL (ref 0.82–1.77)

## 2020-04-16 LAB — T3: T3, Total: 104 ng/dL (ref 71–180)

## 2020-04-19 DIAGNOSIS — E1021 Type 1 diabetes mellitus with diabetic nephropathy: Secondary | ICD-10-CM | POA: Diagnosis not present

## 2020-04-19 DIAGNOSIS — I10 Essential (primary) hypertension: Secondary | ICD-10-CM | POA: Diagnosis not present

## 2020-04-19 DIAGNOSIS — G43709 Chronic migraine without aura, not intractable, without status migrainosus: Secondary | ICD-10-CM | POA: Diagnosis not present

## 2020-04-19 DIAGNOSIS — F319 Bipolar disorder, unspecified: Secondary | ICD-10-CM | POA: Diagnosis not present

## 2020-04-21 ENCOUNTER — Other Ambulatory Visit: Payer: Self-pay

## 2020-04-21 ENCOUNTER — Ambulatory Visit (INDEPENDENT_AMBULATORY_CARE_PROVIDER_SITE_OTHER): Payer: Medicare Other | Admitting: Licensed Clinical Social Worker

## 2020-04-21 DIAGNOSIS — F331 Major depressive disorder, recurrent, moderate: Secondary | ICD-10-CM

## 2020-04-21 DIAGNOSIS — F411 Generalized anxiety disorder: Secondary | ICD-10-CM | POA: Diagnosis not present

## 2020-04-21 NOTE — Progress Notes (Signed)
Virtual Visit via Video Note  I connected with [name] on 04/21/20 at 11:00 AM EDT by a video enabled telemedicine application and verified that I am speaking with the correct person using two identifiers.  Location: Patient: home Provider: [facility]   I discussed the limitations of evaluation and management by telemedicine and the availability of in person appointments. The patient expressed understanding and agreed to proceed.   The patient was advised to call back or seek an in-person evaluation if the symptoms worsen or if the condition fails to improve as anticipated.  I provided 45 minutes of non-face-to-face time during this encounter.   Meiling Hendriks R Avram Danielson, LCSW    THERAPIST PROGRESS NOTE  Session Time: 11:00-11:45 am  Participation Level: Active  Behavioral Response: NeatAlertAnxious  Type of Therapy: Individual Therapy  Treatment Goals addressed: Anxiety and Coping  Interventions: Supportive  Summary: [name] is a 34 y.o. female who presents with continuing stress and mood-related symptoms.  Pt reports that stress is triggered by recent move. Explored pts thoughts and feelings associated with ex husband--has some concerns about daughter (clothes/hair). Pt has significant history with ex and doesn't really prefer to communicate with him--may allow attorneys to communicate with each other.   Allowed pt to explore recent health-related concerns. Pt is doing well with overall diabetes self-management but A1C still went up. Discussed relevance of stress to health and reviewed stress management techniques.   Allowed pt to express thoughts and feelings about father's death--pt continuing her grief journey. Encouraged continued focus on self-care.   Suicidal/Homicidal: No  Therapist Response: [name] is continuing to make good progress with overall management of mood. Pt is experiencing fluctuating/intermittent progress with managing stress--due to increase in  external stressors recently.  Plan: Return again in 4 weeks. Will continue focusing on grief management, CBT, stress management, and supportive therapy.   Diagnosis: Axis I: Generalized Anxiety Disorder and Major Depression, Recurrent moderate    Axis II: No diagnosis    Cristina Bo [name], LCSW 04/21/2020

## 2020-05-04 ENCOUNTER — Encounter (INDEPENDENT_AMBULATORY_CARE_PROVIDER_SITE_OTHER): Payer: Self-pay

## 2020-05-06 ENCOUNTER — Other Ambulatory Visit: Payer: Self-pay

## 2020-05-06 ENCOUNTER — Ambulatory Visit (INDEPENDENT_AMBULATORY_CARE_PROVIDER_SITE_OTHER): Payer: Medicare Other | Admitting: Bariatrics

## 2020-05-06 ENCOUNTER — Ambulatory Visit (INDEPENDENT_AMBULATORY_CARE_PROVIDER_SITE_OTHER): Payer: Medicare Other | Admitting: Family Medicine

## 2020-05-06 ENCOUNTER — Encounter (INDEPENDENT_AMBULATORY_CARE_PROVIDER_SITE_OTHER): Payer: Self-pay | Admitting: Bariatrics

## 2020-05-06 VITALS — BP 111/88 | HR 101 | Temp 97.8°F | Ht 66.0 in | Wt 195.0 lb

## 2020-05-06 DIAGNOSIS — E669 Obesity, unspecified: Secondary | ICD-10-CM | POA: Diagnosis not present

## 2020-05-06 DIAGNOSIS — E559 Vitamin D deficiency, unspecified: Secondary | ICD-10-CM | POA: Diagnosis not present

## 2020-05-06 DIAGNOSIS — Z6831 Body mass index (BMI) 31.0-31.9, adult: Secondary | ICD-10-CM | POA: Diagnosis not present

## 2020-05-06 DIAGNOSIS — E1069 Type 1 diabetes mellitus with other specified complication: Secondary | ICD-10-CM | POA: Diagnosis not present

## 2020-05-06 NOTE — Progress Notes (Signed)
Chief Complaint:   [name] is here to discuss her progress with her obesity treatment plan along with follow-up of her obesity related diagnoses. [name] is keeping a food journal and adhering to recommended goals of 1500 calories and 38 grams of protein and states she is following her eating plan approximately 90% of the time. [name] states she is walking 30 minutes 5 times per week.  Today's visit was #: 7 Starting weight: 199 lbs Starting date: 11/13/2019 Today's weight: 195 lbs Today's date: 05/06/2020 Total lbs lost to date: 4 Total lbs lost since last in-office visit: 7  Interim History: [name] is down an additional 7 lbs since her last visit. She was put on Ozempic 2 weeks ago.  Subjective:   Type 1 diabetes mellitus with other specified complication ([place]). [name] is taking NovoLog, Human insulin/insulin pump, and Ozempic. She reports occasional lows of 80's-140's if not eating.  Lab Results  Component Value Date   HGBA1C 9.5 (H) 04/15/2020   HGBA1C 8.0 (H) 09/09/2019   HGBA1C 9.0 04/05/2017   Lab Results  Component Value Date   LDLCALC 68 04/22/2014   CREATININE 1.66 (H) 04/15/2020   No results found for: INSULIN  Vitamin D deficiency. [name] is taking Vitamin D supplementation.    Ref. Range 03/01/2020 17:13  Vitamin D, 25-Hydroxy Latest Ref Range: 30.0 - 100.0 ng/mL 40.5   Assessment/Plan:   Type 1 diabetes mellitus with other specified complication ([place]). Good blood sugar control is important to decrease the likelihood of diabetic complications such as nephropathy, neuropathy, limb loss, blindness, coronary artery disease, and death. Intensive lifestyle modification including diet, exercise and weight loss are the first line of treatment for diabetes. [name] will continue her medications as directed and follow-up with Endocrinology as scheduled and as directed.   Vitamin D deficiency. Low Vitamin D level contributes to fatigue and are  associated with obesity, breast, and colon cancer. She agrees to continue to take Vitamin D as directed and will follow-up for routine testing of Vitamin D, at least 2-3 times per year to avoid over-replacement.  Class 1 obesity with serious comorbidity and body mass index (BMI) of 31.0 to 31.9 in adult, unspecified obesity type.  [name] is currently in the action stage of change. As such, her goal is to continue with weight loss efforts. She has agreed to keeping a food journal and adhering to recommended goals of 1500 calories and 38 grams of protein.   She will work on meal planning and intentional eating. She will save a food item if eating at night.  Exercise goals: All adults should avoid inactivity. Some physical activity is better than none, and adults who participate in any amount of physical activity gain some health benefits.  Behavioral modification strategies: increasing lean protein intake, decreasing simple carbohydrates, increasing vegetables, increasing water intake, decreasing eating out, no skipping meals, meal planning and cooking strategies, keeping healthy foods in the home and planning for success.  [name] has agreed to follow-up with our clinic in 2-3 weeks. She was informed of the importance of frequent follow-up visits to maximize her success with intensive lifestyle modifications for her multiple health conditions.   Objective:   Blood pressure (!) 111/88, pulse 101, temperature 97.8 F (36.6 C), height 5\' 6"  (1.676 m), weight 195 lb (88.5 kg), SpO2 99 %. Body mass index is 31.47 kg/m.  General: Cooperative, alert, well developed, in no acute distress. HEENT: Conjunctivae and lids unremarkable. Cardiovascular: Regular rhythm.  Lungs:  Normal work of breathing. Neurologic: No focal deficits.   Lab Results  Component Value Date   CREATININE 1.66 (H) 04/15/2020   BUN 26 (H) 04/15/2020   NA 141 04/15/2020   K 4.6 04/15/2020   CL 96 04/15/2020   CO2 26  04/15/2020   Lab Results  Component Value Date   ALT 11 04/15/2020   AST 17 04/15/2020   ALKPHOS 150 (H) 04/15/2020   BILITOT <0.2 04/15/2020   Lab Results  Component Value Date   HGBA1C 9.5 (H) 04/15/2020   HGBA1C 8.0 (H) 09/09/2019   HGBA1C 9.0 04/05/2017   HGBA1C 11.2 (H) 11/24/2015   HGBA1C 13.3 (H) 04/22/2014   No results found for: INSULIN Lab Results  Component Value Date   TSH 1.530 04/15/2020   Lab Results  Component Value Date   CHOL 149 04/22/2014   HDL 71 04/22/2014   LDLCALC 68 04/22/2014   TRIG 49 04/22/2014   CHOLHDL 2.1 04/22/2014   Lab Results  Component Value Date   WBC 6.1 04/15/2020   HGB 13.2 04/15/2020   HCT 39.7 04/15/2020   MCV 89 04/15/2020   PLT 256 04/15/2020   Lab Results  Component Value Date   IRON 41 11/13/2019   TIBC 267 11/13/2019   FERRITIN 77 11/13/2019   Obesity Behavioral Intervention Documentation for Insurance:   Approximately 15 minutes were spent on the discussion below.  ASK: We discussed the diagnosis of obesity with Alyha today and Mandi agreed to give [name] permission to discuss obesity behavioral modification therapy today.  ASSESS: [name] has the diagnosis of obesity and her BMI today is 31.5. [name] is in the action stage of change.   ADVISE: [name] was educated on the multiple health risks of obesity as well as the benefit of weight loss to improve her health. She was advised of the need for long term treatment and the importance of lifestyle modifications to improve her current health and to decrease her risk of future health problems.  AGREE: Multiple dietary modification options and treatment options were discussed and [name] agreed to follow the recommendations documented in the above note.  ARRANGE: [name] was educated on the importance of frequent visits to treat obesity as outlined per CMS and USPSTF guidelines and agreed to schedule her next follow up appointment today.  Attestation  Statements:   Reviewed by clinician on day of visit: allergies, medications, problem list, medical history, surgical history, family history, social history, and previous encounter notes.  [name], am acting as Location manager for [employer], DO   I have reviewed the above documentation for accuracy and completeness, and I agree with the above. Jearld Lesch, DO

## 2020-05-07 ENCOUNTER — Ambulatory Visit (INDEPENDENT_AMBULATORY_CARE_PROVIDER_SITE_OTHER): Payer: Medicare Other | Admitting: Orthopedic Surgery

## 2020-05-07 VITALS — BP 116/72 | HR 86 | Ht 66.0 in | Wt 195.0 lb

## 2020-05-07 DIAGNOSIS — M7062 Trochanteric bursitis, left hip: Secondary | ICD-10-CM | POA: Diagnosis not present

## 2020-05-07 DIAGNOSIS — M65312 Trigger thumb, left thumb: Secondary | ICD-10-CM

## 2020-05-07 NOTE — Patient Instructions (Signed)
Trigger Finger  Trigger finger, also called stenosing tenosynovitis,  is a condition that causes a finger to get stuck in a bent position. Each finger has a tendon, which is a tough, cord-like tissue that connects muscle to bone, and each tendon passes through a tunnel of tissue called a tendon sheath. To move your finger, your tendon needs to glide freely through the sheath. Trigger finger happens when the tendon or the sheath thickens, making it difficult to move your finger. Trigger finger can affect any finger or a thumb. It may affect more than one finger. Mild cases may clear up with rest and medicine. Severe cases require more treatment. What are the causes? Trigger finger is caused by a thickened finger tendon or tendon sheath. The cause of this thickening is not known. What increases the risk? The following factors may make you more likely to develop this condition:  Doing activities that require a strong grip.  Having rheumatoid arthritis, gout, or diabetes.  Being 90-71 years old.  Being female. What are the signs or symptoms? Symptoms of this condition include:  Pain when bending or straightening your finger.  Tenderness or swelling where your finger attaches to the palm of your hand.  A lump in the palm of your hand or on the inside of your finger.  Hearing a noise like a pop or a snap when you try to straighten your finger.  Feeling a catching or locking sensation when you try to straighten your finger.  Being unable to straighten your finger. How is this diagnosed? This condition is diagnosed based on your symptoms and a physical exam. How is this treated? This condition may be treated by:  Resting your finger and avoiding activities that make symptoms worse.  Wearing a finger splint to keep your finger extended.  Taking NSAIDs, such as ibuprofen, to relieve pain and swelling.  Doing gentle exercises to stretch the finger as told by your health care  provider.  Having medicine that reduces swelling and inflammation (steroids) injected into the tendon sheath. Injections may need to be repeated.  Having surgery to open the tendon sheath. This may be done if other treatments do not work and you cannot straighten your finger. You may need physical therapy after surgery. Follow these instructions at home: If you have a splint:  Wear the splint as told by your health care provider. Remove it only as told by your health care provider.  Loosen it if your fingers tingle, become numb, or turn cold and blue.  Keep it clean.  If the splint is not waterproof: ? Do not let it get wet. ? Cover it with a watertight covering when you take a bath or shower. Managing pain, stiffness, and swelling     If directed, apply heat to the affected area as often as told by your health care provider. Use the heat source that your health care provider recommends, such as a moist heat pack or a heating pad.  Place a towel between your skin and the heat source.  Leave the heat on for 20-30 minutes.  Remove the heat if your skin turns bright red. This is especially important if you are unable to feel pain, heat, or cold. You may have a greater risk of getting burned. If directed, put ice on the painful area. To do this:  If you have a removable splint, remove it as told by your health care provider.  Put ice in a plastic bag.  Place a  towel between your skin and the bag or between your splint and the bag.  Leave the ice on for 20 minutes, 2-3 times a day.  Activity  Rest your finger as told by your health care provider. Avoid activities that make the pain worse.  Return to your normal activities as told by your health care provider. Ask your health care provider what activities are safe for you.  Do exercises as told by your health care provider.  Ask your health care provider when it is safe to drive if you have a splint on your hand. General  instructions  Take over-the-counter and prescription medicines only as told by your health care provider.  Keep all follow-up visits as told by your health care provider. This is important. Contact a health care provider if:  Your symptoms are not improving with home care. Summary  Trigger finger, also called stenosing tenosynovitis, causes your finger to get stuck in a bent position. This can make it difficult and painful to straighten your finger.  This condition develops when a finger tendon or tendon sheath thickens.  Treatment may include resting your finger, wearing a splint, and taking medicines.  In severe cases, surgery to open the tendon sheath may be needed. This information is not intended to replace advice given to you by your health care provider. Make sure you discuss any questions you have with your health care provider. Document Revised: 02/10/2019 Document Reviewed: 02/10/2019 Elsevier Patient Education  [place] Steroid Injection A joint steroid injection is a procedure to relieve swelling and pain in a joint. Steroids are medicines that reduce inflammation. In this procedure, your health care provider uses a syringe and a needle to inject a steroid medicine into a painful and inflamed joint. A pain-relieving medicine (anesthetic) may be injected along with the steroid. In some cases, your health care provider may use an imaging technique such as ultrasound or fluoroscopy to guide the injection. Joints that are often treated with steroid injections include the knee, shoulder, hip, and spine. These injections may also be used in the elbow, ankle, and joints of the hands or feet. You may have joint steroid injections as part of your treatment for inflammation caused by:  Gout.  Rheumatoid arthritis.  Advanced wear-and-tear arthritis (osteoarthritis).  Tendinitis.  Bursitis. Joint steroid injections may be repeated, but having them too often can  damage a joint or the skin over the joint. You should not have joint steroid injections less than 6 weeks apart or more than four times a year. Tell a health care provider about:  Any allergies you have.  All medicines you are taking, including vitamins, herbs, eye drops, creams, and over-the-counter medicines.  Any problems you or family members have had with anesthetic medicines.  Any blood disorders you have.  Any surgeries you have had.  Any medical conditions you have.  Whether you are pregnant or may be pregnant. What are the risks? Generally, this is a safe treatment. However, problems may occur, including:  Infection.  Bleeding.  Allergic reactions to medicines.  Damage to the joint or tissues around the joint.  Thinning of skin or loss of skin color over the joint.  Temporary flushing of the face or chest.  Temporary increase in pain.  Temporary increase in blood sugar.  Failure to relieve inflammation or pain. What happens before the treatment?  You may have imaging tests of your joint.  Ask your health care provider about: ? Changing or  stopping your regular medicines. This is especially important if you are taking diabetes medicines or blood thinners. ? Taking medicines such as aspirin and ibuprofen. These medicines can thin your blood. Do not take these medicines unless your health care provider tells you to take them. ? Taking over-the-counter medicines, vitamins, herbs, and supplements.  Ask your health care provider if you can drive yourself home after the procedure. What happens during the treatment?   Your health care provider will position you for the injection and locate the injection site over your joint.  The skin over the joint will be cleaned with a germ-killing soap.  Your health care provider may: ? Spray a numbing solution (topical anesthetic) over the injection site. ? Inject a local anesthetic under the skin above your joint.  The  needle will be placed through your skin into your joint. Your health care provider may use imaging to guide the needle to the right spot for the injection. If imaging is used, a special contrast dye may be injected to confirm that the needle is in the correct location.  The steroid medicine will be injected into your joint.  Anesthetic may be injected along with the steroid. This may be a medicine that relieves pain for a short time (short-acting anesthetic) or for a longer time (long-acting anesthetic).  The needle will be removed, and an adhesive bandage (dressing) will be placed over the injection site. The procedure may vary among health care providers and hospitals. What can I expect after the treatment?  You will be able to go home after the treatment.  It is normal to feel slight flushing for a few days after the injection.  After the treatment, it is common to have an increase in joint pain after the anesthetic has worn off. This may happen about an hour after a short-acting anesthetic or about 8 hours after a longer-acting anesthetic.  You should begin to feel relief from joint pain and swelling after 24 to 48 hours. Follow these instructions at home: Injection site care  Leave the adhesive dressing over your injection site in place until your health care provider says you can remove it.  Check your injection site every day for signs of infection. Check for: ? Redness, swelling, or pain. ? Fluid or blood. ? Warmth. ? Pus or a bad smell. Activity  Return to your normal activities as told by your health care provider. Ask your health care provider what activities are safe for you. You may be asked to limit activities that put stress on the joint for a few days.  Do joint exercises as told by your health care provider.  Do not take baths, swim, or use a hot tub until your health care provider approves. Managing pain, stiffness, and swelling   If directed, put ice on the  joint. ? Put ice in a plastic bag. ? Place a towel between your skin and the bag. ? Leave the ice on for 20 minutes, 2-3 times a day.  Raise (elevate) your joint above the level of your heart when you are sitting or lying down. General instructions  Take over-the-counter and prescription medicines only as told by your health care provider.  Do not use any products that contain nicotine or tobacco, such as cigarettes, e-cigarettes, and chewing tobacco. These can delay joint healing. If you need help quitting, ask your health care provider.  If you have diabetes, be aware that your blood sugar may be slightly elevated for several  days after the injection.  Keep all follow-up visits as told by your health care provider. This is important. Contact a health care provider if you have:  Chills or a fever.  Any signs of infection at your injection site.  Increased pain or swelling or no relief after 2 days. Summary  A joint steroid injection is a treatment to relieve pain and swelling in a joint.  Steroids are medicines that reduce inflammation. Your health care provider may add an anesthetic along with the steroid.  You may have joint steroid injections as part of your arthritis treatment.  Joint steroid injections may be repeated, but having them too often can damage a joint or the skin over the joint.  Contact your health care provider if you have a fever, chills, or signs of infection or if you get no relief from joint pain or swelling. This information is not intended to replace advice given to you by your health care provider. Make sure you discuss any questions you have with your health care provider. Document Revised: 05/28/2018 Document Reviewed: 05/28/2018 Elsevier Patient Education  2020 [employer].

## 2020-05-07 NOTE — Progress Notes (Signed)
[name]  05/07/2020  Body mass index is 31.47 kg/m.   S:  Chief Complaint  Patient presents with  . Hand Pain    Left trigger thumb    2 months of pain locking catching worsening left thumb requiring manipulation to get the finger straight history of carpal tunnel release did well   Patient has a history of trochanteric bursitis, symptomatic.  Past Medical History:  Diagnosis Date  . Anemia   . Anxiety   . Asthma   . Asthma 11/10/2013  . Back pain   . Bipolar 1 disorder ([place])   . Chest pain   . Chronic renal failure syndrome, stage 3 (moderate)   . Complication of anesthesia   . Constipation   . Depression   . Depression   . Diabetic gastroparesis ([place])   . Diabetic neuropathy, type I diabetes mellitus ([place])   . Edema, lower extremity   . Gallbladder disease   . Gastroparesis   . GERD (gastroesophageal reflux disease)   . Headache(784.0)   . Hypertension   . Joint pain   . Kidney stones   . Palpitations   . Polyneuropathy in diabetes(357.2)   . PONV (postoperative nausea and vomiting)   . 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 Shortness of breath   . Tachycardia    baseline tachycardia   . 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 ANAL RECTAL MANOMETRY N/A 03/25/2018   Procedure: ANO RECTAL MANOMETRY;  Surgeon: Doran Stabler, MD;  Location: [facility];  Service: Gastroenterology;  Laterality: N/A;  . CARPAL TUNNEL RELEASE Left 10/16/2019   Procedure: LEFT CARPAL TUNNEL RELEASE;  Surgeon: Carole Civil, MD;  Location: [facility];  Service: Orthopedics;  Laterality: Left;  . CESAREAN SECTION     x 2  . CHOLECYSTECTOMY    . EYE SURGERY    . REFRACTIVE SURGERY Bilateral       O: BP 116/72   Pulse 86   Ht 5\' 6"  (1.676 m)   Wt 195 lb (88.5 kg)   BMI 31.47 kg/m   Physical Exam  She is awake alert and oriented x3 mood and affect are  normal body habitus BMI is 31.47  Well-groomed  Tenderness over the A1 pulley of the left thumb with catching and locking neurovascular exam is intact color capillary refill normal flexor tendon strength is normal  Tenderness over the left greater trochanter normal range of motion of the left hip   MEDICAL DECISION MAKING  Encounter Diagnoses  Name Primary?  . Trochanteric bursitis, left hip   . Trigger thumb, left thumb Yes      IMAGING: Independent interpretation of images: no   Outside records reviewed: no  MANAGEMENT   Left Trigger thumb injection Medication  1 mL of 40 mg Depo-Medrol  2 mL of 1% lidocaine plain  Ethyl chloride for anesthesia  Verbal consent was obtained timeout was taken to confirm the injection site as left thumb  Alcohol was used to prepare the skin along with ethyl chloride and then the injection was made at the A1 pulley there were no complications    Procedure note injection for left hip bursitis  Verbal consent was obtained for injection of the  left hip   Timeout was completed to confirm the injection site  The medications used were 40 mg of Depo-Medrol and 1% lidocaine 3 cc  Anesthesia was provided by ethyl chloride and the skin was prepped with alcohol.  After cleaning the skin with alcohol a 25-gauge needle was used to inject the left hip greater trochanteric bursa  No orders of the defined types were placed in this encounter.  Chronic problem injected    Arther Abbott, MD  05/07/2020 9:54 AM

## 2020-05-13 ENCOUNTER — Other Ambulatory Visit: Payer: Self-pay

## 2020-05-13 ENCOUNTER — Ambulatory Visit (INDEPENDENT_AMBULATORY_CARE_PROVIDER_SITE_OTHER): Payer: Medicare Other | Admitting: Licensed Clinical Social Worker

## 2020-05-13 DIAGNOSIS — F331 Major depressive disorder, recurrent, moderate: Secondary | ICD-10-CM

## 2020-05-13 DIAGNOSIS — F411 Generalized anxiety disorder: Secondary | ICD-10-CM

## 2020-05-13 NOTE — Progress Notes (Signed)
Virtual Visit via Video Note  I connected with [name] on 05/13/20 at 11:00 AM EDT by a video enabled telemedicine application and verified that I am speaking with the correct person using two identifiers.  Location: Patient: home Provider: [facility]   I discussed the limitations of evaluation and management by telemedicine and the availability of in person appointments. The patient expressed understanding and agreed to proceed.   The patient was advised to call back or seek an in-person evaluation if the symptoms worsen or if the condition fails to improve as anticipated.  I provided 30 minutes of non-face-to-face time during this encounter.   [name], LCSW    THERAPIST PROGRESS NOTE  Session Time: 11:00-11:30 am  Participation Level: Active  Behavioral Response: Neat and Well GroomedAlertAnxious  Type of Therapy: Individual Therapy  Treatment Goals addressed: Anxiety and Coping  Interventions: Supportive and Reframing  Summary: [name] is a 34 y.o. female who presents with improving symptoms related to her diagnoses (depression, anxiety). Pt is in the process of moving from her childhood home into the home of her MIL.  Mother moving back into the childhood home.  Allowed pt to explore and express thoughts and feelings surrounding relationship with mother and recent situational triggers.  Suicidal/Homicidal: No  Therapist Response: [name] reports that mood continues to fluctuate depending on the situation, which is indicative of fluctuating/intermittent progress.  Pt reports that she could work better with anxiety/stress management. Reviewed importance of overall self care and reviewed stress management techniques with pt.   Plan: Return again in 3 weeks. Ongoing treatment plan to include maintaining current levels of progress, preventing symptom relapse and continuing to build skills to manage mood, stress, and anxiety.  Diagnosis: Axis I:  Generalized Anxiety Disorder and Major Depression, Recurrent moderate    Axis II: No diagnosis    Cristina Bo Kalista Laguardia, LCSW 05/13/2020

## 2020-05-26 ENCOUNTER — Telehealth: Payer: Self-pay | Admitting: Gastroenterology

## 2020-05-26 NOTE — Telephone Encounter (Signed)
This has been a chronic struggle due to her diabetes and multiple medicines. 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  That greatly limits what I can offer for medicines.  Risk of medicine interactions and the possibility of tremor and tardive dyskinesia was the reason I stopped prescribing metoclopramide for daily use, in particular because she is also on [place].  If she is willing to stop the phenergan for now, and is also willing to accept the risk of neurologic side effects including tardive dyskinesia, which we had previously discussed, then I would be willing to give her a short trial of metoclopramide (no more than a week) to see if it gives some relief.  I again recommend efforts at obtaining domperidone if her finances allow.

## 2020-05-26 NOTE — Telephone Encounter (Signed)
Pt states she had a sour stomach last week, she took her phenergan and was able to have a BM and got better. She now has a sour stomach again, she has taken phenergan and she has not been able to have a BM. She has taken motegrity but reports she only passed a "few droplet." States she has tried to make herself throw up and it has not worked. She states she has not been able to get the domperidone yet. Please advise.

## 2020-05-26 NOTE — Telephone Encounter (Signed)
Pt states that she has been having sour stomach for the past two weeks and has not been able to eat much and have a bm either. She would like some advise or something prescribed.

## 2020-05-27 ENCOUNTER — Ambulatory Visit (INDEPENDENT_AMBULATORY_CARE_PROVIDER_SITE_OTHER): Payer: Medicare Other | Admitting: Family Medicine

## 2020-05-27 NOTE — Telephone Encounter (Signed)
Patient returned your call.

## 2020-05-27 NOTE — Telephone Encounter (Signed)
Left message for pt to call back  °

## 2020-05-28 DIAGNOSIS — H532 Diplopia: Secondary | ICD-10-CM | POA: Diagnosis not present

## 2020-05-28 DIAGNOSIS — Z9889 Other specified postprocedural states: Secondary | ICD-10-CM | POA: Diagnosis not present

## 2020-05-28 DIAGNOSIS — H50111 Monocular exotropia, right eye: Secondary | ICD-10-CM | POA: Diagnosis not present

## 2020-06-01 MED ORDER — METOCLOPRAMIDE HCL 5 MG PO TABS
5.0000 mg | ORAL_TABLET | Freq: Three times a day (TID) | ORAL | 0 refills | Status: DC | PRN
Start: 2020-06-01 — End: 2020-07-09

## 2020-06-01 NOTE — Telephone Encounter (Signed)
I prescribed her a small supply of metoclopramide 5 mg tablets to take if needed before her domperidone arrives.  - HD

## 2020-06-01 NOTE — Telephone Encounter (Signed)
Pt states she is doing better now. She is planning on getting the domperidone by next week. She is willing to try reglan low dose but doesn't know if she should just wait to call back if she is having symptoms or have it on hand. Please advise.

## 2020-06-01 NOTE — Telephone Encounter (Signed)
Spoke with pt and she is aware.

## 2020-06-03 ENCOUNTER — Other Ambulatory Visit: Payer: Self-pay

## 2020-06-03 ENCOUNTER — Ambulatory Visit: Payer: Medicare Other | Admitting: Licensed Clinical Social Worker

## 2020-06-03 ENCOUNTER — Telehealth: Payer: Self-pay | Admitting: Licensed Clinical Social Worker

## 2020-06-03 NOTE — Telephone Encounter (Signed)
LCSW counselor tried to connect with patient for scheduled appointment via MyChart video text request x 2 and email request; also tried to connect via phone. Pt states that her car is broken down and that she will call the office back when she gets home to [facility] OPT appointment

## 2020-06-05 DIAGNOSIS — E109 Type 1 diabetes mellitus without complications: Secondary | ICD-10-CM | POA: Diagnosis not present

## 2020-06-10 ENCOUNTER — Encounter (INDEPENDENT_AMBULATORY_CARE_PROVIDER_SITE_OTHER): Payer: Self-pay | Admitting: Family Medicine

## 2020-06-10 DIAGNOSIS — E109 Type 1 diabetes mellitus without complications: Secondary | ICD-10-CM

## 2020-06-15 MED ORDER — OZEMPIC (0.25 OR 0.5 MG/DOSE) 2 MG/1.5ML ~~LOC~~ SOPN: 0.5000 mg | PEN_INJECTOR | SUBCUTANEOUS | 0 refills | Status: DC

## 2020-06-16 ENCOUNTER — Ambulatory Visit (INDEPENDENT_AMBULATORY_CARE_PROVIDER_SITE_OTHER): Payer: Medicare Other | Admitting: Family Medicine

## 2020-06-16 ENCOUNTER — Other Ambulatory Visit: Payer: Self-pay

## 2020-06-16 ENCOUNTER — Telehealth: Payer: Self-pay | Admitting: Family Medicine

## 2020-06-16 DIAGNOSIS — N200 Calculus of kidney: Secondary | ICD-10-CM

## 2020-06-16 DIAGNOSIS — N183 Chronic kidney disease, stage 3 unspecified: Secondary | ICD-10-CM

## 2020-06-16 NOTE — Telephone Encounter (Signed)
Order has been placed for Pt

## 2020-06-16 NOTE — Telephone Encounter (Signed)
Cb# [phone]   Need referral to a Neurologist closer to home which is [facility]

## 2020-06-17 ENCOUNTER — Telehealth (INDEPENDENT_AMBULATORY_CARE_PROVIDER_SITE_OTHER): Payer: Medicare Other | Admitting: Family Medicine

## 2020-06-17 ENCOUNTER — Other Ambulatory Visit: Payer: Self-pay

## 2020-06-17 ENCOUNTER — Encounter (INDEPENDENT_AMBULATORY_CARE_PROVIDER_SITE_OTHER): Payer: Self-pay | Admitting: Family Medicine

## 2020-06-17 VITALS — Wt 191.0 lb

## 2020-06-17 DIAGNOSIS — F314 Bipolar disorder, current episode depressed, severe, without psychotic features: Secondary | ICD-10-CM

## 2020-06-17 DIAGNOSIS — E559 Vitamin D deficiency, unspecified: Secondary | ICD-10-CM | POA: Diagnosis not present

## 2020-06-17 DIAGNOSIS — E669 Obesity, unspecified: Secondary | ICD-10-CM

## 2020-06-17 DIAGNOSIS — E1069 Type 1 diabetes mellitus with other specified complication: Secondary | ICD-10-CM | POA: Diagnosis not present

## 2020-06-17 DIAGNOSIS — Z6830 Body mass index (BMI) 30.0-30.9, adult: Secondary | ICD-10-CM

## 2020-06-18 ENCOUNTER — Telehealth: Payer: Self-pay | Admitting: Psychiatry

## 2020-06-18 ENCOUNTER — Other Ambulatory Visit: Payer: Self-pay

## 2020-06-18 MED ORDER — CARBAMAZEPINE ER 200 MG PO CP12: ORAL_CAPSULE | ORAL | 0 refills | Status: DC

## 2020-06-18 NOTE — Telephone Encounter (Signed)
Please advise 

## 2020-06-18 NOTE — Telephone Encounter (Signed)
Pt called and said that she has been off all of her medicines for a week now. She doesn't know what to do. She stopped her medicines because she is bi polar. She was crying on the phone and mentally not doing well. Please give her a call at [phone]

## 2020-06-18 NOTE — Telephone Encounter (Signed)
Pt was supposed to FU in July.  If pt abruptly stopped all her psych meds including CBZ and lamotrigine she is at seizure risk in addition to risk of psychiatric decompensation.  This would be a dangerous thing to do.  She may require hospitalization.  If she stopped the lamotrigine do not restart it if it's been more than 5 days.  Restart Latudat 120 mg and CBZ at 1 in the AM and 3 at night.  If she gets worse go to the ER.

## 2020-06-18 NOTE — Telephone Encounter (Signed)
Rtc to patient and she is scheduled for Tuesday 09/14  Gave her instructions on restarting Latuda 120 mg and CBZ. Patient reports she has not taken Equetro in over a month because [employer] wouldn't cover it but she's now back on Medicare. Asking for new Rx to updated [facility] [place]

## 2020-06-18 NOTE — Telephone Encounter (Signed)
Left patient message to call back about restarting her medication and setting up apt for Tuesday 09/14

## 2020-06-19 MED ORDER — VITAMIN D (ERGOCALCIFEROL) 1.25 MG (50000 UNIT) PO CAPS: 50000.0000 [IU] | ORAL_CAPSULE | ORAL | 0 refills | Status: DC

## 2020-06-19 NOTE — Progress Notes (Signed)
TeleHealth Visit:  Due to the COVID-19 pandemic, this visit was completed with telemedicine (audio/video) technology to reduce patient and provider exposure as well as to preserve personal protective equipment.   [name] has verbally consented to this TeleHealth visit. The patient is located at home, the provider is located at the [facility] office. The participants in this visit include the listed provider and patient. The visit was conducted today via mychart video.  Chief Complaint: OBESITY [name] is here to discuss her progress with her obesity treatment plan along with follow-up of her obesity related diagnoses. [name] is on keeping a food journal and adhering to recommended goals of 1500 calories and 95 protein and states she is following her eating plan approximately 50% of the time. [name] states she is walking 30 minutes 3 times per week.  Today's visit was #: 8  Interim History: [name] and her family are now living in their camper. This was the plan, but she felt forced to move earlier than expected due to family conflict. She feels overwhelmed. Admits that she is using food as a coping mechanism. As her weight increases, so does her anxiety.   Assessment/Plan:   1. Vitamin D deficiency Not at goal. Optimal goal > 50 ng/dL. There is also evidence to support a goal of >70 ng/dL in patients with cancer and heart disease. Plan: Continue Vitamin D @50 ,000 IU every week with follow-up for routine testing of Vitamin D at least 2-3 times per year to avoid over-replacement.  - Vitamin D, Ergocalciferol, (DRISDOL) 1.25 MG (50000 UNIT) CAPS capsule; Take 1 capsule (50,000 Units total) by mouth every 7 (seven) days.  Dispense: 4 capsule; Refill: 0  2. Type 1 diabetes mellitus with other specified complication (HCC) Diabetes Mellitus: poorly controlled Issues reviewed with her: blood sugar goals, complications of diabetes mellitus, hypoglycemia prevention and treatment,  exercise, nutrition, and carbohydrate counting.   Lab Results  Component Value Date   HGBA1C 9.5 (H) 04/15/2020   HGBA1C 8.0 (H) 09/09/2019   HGBA1C 9.0 04/05/2017   Lab Results  Component Value Date   LDLCALC 68 04/22/2014   CREATININE 1.66 (H) 04/15/2020   Health Maintenance BP goal < 130/80, LDL < 100, HDL > 40 and TG < 150. All patients with diabetes should be on a statin unless contraindicated. Dietary recommendations: < 150 g carbohydrates daily. Physical activity recommendations: as tolerated aerobic and strength exercises.  3. Bipolar disorder with severe depression, grief Followed by Psychiatry. [place] has been struggling with grief, gamily arguments, and a recent move. Behavior modification techniques were discussed today to help [name] deal with her emotional/non-hunger eating behaviors.    4. Class 1 obesity with serious comorbidity and body mass index (BMI) of 30.0 to 30.9 in adult, unspecified obesity type  [name] is currently in the action stage of change. As such, her goal is to continue with weight loss efforts. She has agreed to keeping a food journal and adhering to recommended goals of 1500 calories and 95 protein.   Exercise goals: For substantial health benefits, adults should do at least 150 minutes (2 hours and 30 minutes) a week of moderate-intensity, or 75 minutes (1 hour and 15 minutes) a week of vigorous-intensity aerobic physical activity, or an equivalent combination of moderate- and vigorous-intensity aerobic activity. Aerobic activity should be performed in episodes of at least 10 minutes, and preferably, it should be spread throughout the week. Adults should also include muscle-strengthening activities that involve all major muscle groups  on 2 or more days a week.  Behavioral modification strategies: increasing lean protein intake, decreasing simple carbohydrates, increasing vegetables and increasing water intake.  [name] has agreed to follow-up with  our clinic in 2 weeks. She was informed of the importance of frequent follow-up visits to maximize her success with intensive lifestyle modifications for her multiple health conditions.  Objective:   VITALS: Per patient if applicable, see vitals. GENERAL: Alert and in no acute distress. CARDIOPULMONARY: No increased WOB. Speaking in clear sentences.  PSYCH: Pleasant and cooperative. Speech normal rate and rhythm. Affect is appropriate. Insight and judgement are appropriate. Attention is focused, linear, and appropriate.  NEURO: Oriented as arrived to appointment on time with no prompting.   Lab Results  Component Value Date   CREATININE 1.66 (H) 04/15/2020   BUN 26 (H) 04/15/2020   NA 141 04/15/2020   K 4.6 04/15/2020   CL 96 04/15/2020   CO2 26 04/15/2020   Lab Results  Component Value Date   ALT 11 04/15/2020   AST 17 04/15/2020   ALKPHOS 150 (H) 04/15/2020   BILITOT <0.2 04/15/2020   Lab Results  Component Value Date   HGBA1C 9.5 (H) 04/15/2020   HGBA1C 8.0 (H) 09/09/2019   HGBA1C 9.0 04/05/2017   HGBA1C 11.2 (H) 11/24/2015   HGBA1C 13.3 (H) 04/22/2014   No results found for: INSULIN Lab Results  Component Value Date   TSH 1.530 04/15/2020   Lab Results  Component Value Date   CHOL 149 04/22/2014   HDL 71 04/22/2014   LDLCALC 68 04/22/2014   TRIG 49 04/22/2014   CHOLHDL 2.1 04/22/2014   Lab Results  Component Value Date   WBC 6.1 04/15/2020   HGB 13.2 04/15/2020   HCT 39.7 04/15/2020   MCV 89 04/15/2020   PLT 256 04/15/2020   Lab Results  Component Value Date   IRON 41 11/13/2019   TIBC 267 11/13/2019   FERRITIN 77 11/13/2019    Attestation Statements:   Reviewed by clinician on day of visit: allergies, medications, problem list, medical history, surgical history, family history, social history, and previous encounter notes.  Time spent on visit including pre-visit chart review and post-visit charting and care was 30 minutes.

## 2020-06-21 ENCOUNTER — Other Ambulatory Visit: Payer: Self-pay | Admitting: Psychiatry

## 2020-06-21 ENCOUNTER — Other Ambulatory Visit: Payer: Self-pay

## 2020-06-21 ENCOUNTER — Ambulatory Visit (INDEPENDENT_AMBULATORY_CARE_PROVIDER_SITE_OTHER): Payer: Medicare Other | Admitting: Licensed Clinical Social Worker

## 2020-06-21 DIAGNOSIS — F3162 Bipolar disorder, current episode mixed, moderate: Secondary | ICD-10-CM

## 2020-06-21 DIAGNOSIS — Z9114 Patient's other noncompliance with medication regimen: Secondary | ICD-10-CM | POA: Diagnosis not present

## 2020-06-21 DIAGNOSIS — F411 Generalized anxiety disorder: Secondary | ICD-10-CM | POA: Diagnosis not present

## 2020-06-21 DIAGNOSIS — J45901 Unspecified asthma with (acute) exacerbation: Secondary | ICD-10-CM

## 2020-06-21 MED ORDER — CARBAMAZEPINE ER 200 MG PO TB12
ORAL_TABLET | ORAL | 0 refills | Status: DC
Start: 2020-06-21 — End: 2020-06-22

## 2020-06-21 MED ORDER — ALBUTEROL SULFATE HFA 108 (90 BASE) MCG/ACT IN AERS: 2.0000 | INHALATION_SPRAY | Freq: Four times a day (QID) | RESPIRATORY_TRACT | 2 refills | Status: DC | PRN

## 2020-06-21 NOTE — Progress Notes (Signed)
Virtual Visit via Video Note  I connected with [name] on 06/21/20 at 11:00 AM EDT by a video enabled telemedicine application and verified that I am speaking with the correct person using two identifiers.  Location: Patient: home Provider: [facility]   I discussed the limitations of evaluation and management by telemedicine and the availability of in person appointments. The patient expressed understanding and agreed to proceed.  The patient was advised to call back or seek an in-person evaluation if the symptoms worsen or if the condition fails to improve as anticipated.  I provided 60 minutes of non-face-to-face time during this encounter.   [place], LCSW    THERAPIST PROGRESS NOTE  Session Time: 11:00a-12:00p  Participation Level: Active  Behavioral Response: NeatAlertAnxious and Depressed  Type of Therapy: Individual Therapy  Treatment Goals addressed: Anxiety and Coping  Interventions: CBT and Supportive  Summary: [name] is a 34 y.o. female who presents with increasing depression and anxiety symptoms. Pt admits that she discontinued all of her behavioral health medications. 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  Pt reports that she had some suicidal thoughts with no plans or intent to follow through. Pt reports that she immediately called her psychiatrist as soon as she felt SI. Pt has appt with psychiatrist tomorrow (9/14). Pt reports that she feels stress and feeling overwhelmed were triggers for the medication noncompliance.  Allowed pt to explore and express thoughts and feelings about several recent external stressors:  Relationship with husband, relationship with mother, recently moving homes under a strict timeline, feeling isolated, negative thoughts about self/parenting.   Pt admits that she has done very little self-care, no physical activity, and feels  that life is very out of balance at this time.  Discussed intensity, frequency, and duration of symptoms and completed new PHQ9 with score of 20.  Reviewed safety and pt reports that she is not suicidal at time of session. Discussed local support options--recommended [facility] (behavior[facility] urgent care). Gave pt location and instructed that facility is open 24/7. Pt reflected understanding.  Reviewed importance of self-care and keeping life in balance. Pt feels overwhelmed with life right now and feels that she doesn't have motivation and initiative to do much self-care. Explored family relationships and discussed importance of setting limits and boundaries in relationships. Encouraged pt to seek social engagement/supports. Pt feels isolated and with limited social supports. Encouraged continued development of self care and self preservation, better mindfulness, and improving coping skills due to recent changes in family life.   Suicidal/Homicidal: Yes; without intent/plan. Pt denies having SI at time of session--was having SI 1+ week ago. Pt reports no HI or AVH at time of session.  Therapist Response: [name] is reporting a significant increase in overall symptoms, which is indicative of fluctuating/intermittent progress.   Plan: Return again in 1 week. Refer to intensive outpatient. Ongoing treatment plan includes managing crisis situations, medication compliance, adjunctive psychiatric assistance, mood management, stress/anxiety management. LCSW therapist will refer for intensive outpatient therapy/groups and will see pt more frequently until immediate crisis situation seems more stable.   Diagnosis: Axis I: Bipolar, mixed and Generalized Anxiety Disorder    Axis II: No diagnosis    [name] Marilyn Wing, LCSW 06/21/2020

## 2020-06-21 NOTE — Telephone Encounter (Signed)
RX sent for carbamazepine ER 200 mg 1 in an and 2 at night

## 2020-06-22 ENCOUNTER — Telehealth: Payer: Self-pay | Admitting: Psychiatry

## 2020-06-22 ENCOUNTER — Other Ambulatory Visit: Payer: Self-pay | Admitting: Psychiatry

## 2020-06-22 ENCOUNTER — Ambulatory Visit (INDEPENDENT_AMBULATORY_CARE_PROVIDER_SITE_OTHER): Payer: Medicare Other | Admitting: Psychiatry

## 2020-06-22 ENCOUNTER — Encounter: Payer: Self-pay | Admitting: Psychiatry

## 2020-06-22 DIAGNOSIS — G2581 Restless legs syndrome: Secondary | ICD-10-CM

## 2020-06-22 DIAGNOSIS — F411 Generalized anxiety disorder: Secondary | ICD-10-CM | POA: Diagnosis not present

## 2020-06-22 DIAGNOSIS — Z9114 Patient's other noncompliance with medication regimen: Secondary | ICD-10-CM | POA: Diagnosis not present

## 2020-06-22 DIAGNOSIS — F4001 Agoraphobia with panic disorder: Secondary | ICD-10-CM | POA: Diagnosis not present

## 2020-06-22 DIAGNOSIS — F5105 Insomnia due to other mental disorder: Secondary | ICD-10-CM

## 2020-06-22 DIAGNOSIS — F3162 Bipolar disorder, current episode mixed, moderate: Secondary | ICD-10-CM

## 2020-06-22 MED ORDER — QUETIAPINE FUMARATE ER 200 MG PO TB24: ORAL_TABLET | ORAL | 1 refills | Status: DC

## 2020-06-22 NOTE — Progress Notes (Signed)
[name] [id number] 07-Jun-1986 34 y.o.   Virtual Visit via [place]  I connected with pt by WebEx and verified that I am speaking with the correct person using two identifiers.   I discussed the limitations, risks, security and privacy concerns of performing an evaluation and management service by [name] and the availability of in person appointments. I also discussed with the patient that there may be a patient responsible charge related to this service. The patient expressed understanding and agreed to proceed.  I discussed the assessment and treatment plan with the patient. The patient was provided an opportunity to ask questions and all were answered. The patient agreed with the plan and demonstrated an understanding of the instructions.   The patient was advised to call back or seek an in-person evaluation if the symptoms worsen or if the condition fails to improve as anticipated.  I provided 30 minutes of video time during this encounter. The call started at 330 and ended at 4:00. The patient was located at home and the provider was located office.   Subjective:   Patient ID:  [name] is a 34 y.o. (DOB [birth year]) female.  Chief Complaint:  Chief Complaint  Patient presents with  . Follow-up    Medication Management  . Other    Bipolar 1  . Depression  . anger  . Memory Loss    Depression        Maurina Fawaz presents to the office today for follow-up of bipolar disorder and generalized anxiety disorder.  When seen June 05, 2019.  The following changes were made: Change pramipexole ER and increase to 0.75 mg pm to rid nausea and improve mood in the afternoon.  For tiredness in the afternoon switch Equetro to 400 mg AM , 1@ 6 and 1 at 11. For panic in daytime start gabapentin 300 mg each am as preventative.  She's only taking it prn now. We continued unchanged Latuda 120 mg and lamotrigine 300 mg twice daily for bipolar disorder. She just got the ER  pramipexole about a week ago.  Can't tell difference with nausea DT  gastroparesis worse lately with constipation Less afternoon tiredness with current split Equetro dose.    Last seen July 22, 2019.  Meds were not changed at that visit.  She called back to October 20 wanting to try valproate because of racing thoughts and difficulty sleeping.  Stating the trazodone was not working.  She was told it was okay to start Depakote ER 500 mg nightly and then gradually increased to 1500 mg nightly.  She called back again on October 6 and this physician noted the following: RTC Pt called to report new med she started having a lot of side effects mood swings, sounds in ears, suicidal thoughts, tremors. Causing issues with husband She is currently taking Depakote ER 1500 mg nightly. She commits to safety. The only symptom directly attributable to Depakote would be the tremor.  However its presence would indicate she is not going to be able to tolerate a higher dose of Depakote to control her mood symptoms.  Therefore we will wean the Depakote. In order to control the mood symptoms we will increase the Equetro at the evening dose which should not cause significant side effects problems during the day but help with mood stability.  This increase should also make it easier to wean the Depakote quickly.  She agrees to this plan.  She will call back if the suicidal thoughts become too intense to  tolerate. Reduce Depakote ER 500 mg tablets to 1 a night for 4 nights then stop Increase Equetro 200 mg capsules to 2 in the morning and 3 nightly. [name], MD, DFAPA  She's been back on Equetro and doing OK. 2 weeks leading to Xmas with a lot of anger and lashing out a good bit.  Had run out of mirapex and Latuda for a couple of days in that time frame.  Crying is less now.  No SE CBZ now.  A lot of migraines off her Trokendi bc risk kidney stones.  Plans Botox for HA.  Usually sleeping OK without  trazodone.   Still taking Latuda, buspirone, gabapentin, lamotrigine 300 mg BID.    seen 12.30.21 without med change.    12/17/19 appt with the following noted: Since then increased gabapentin for anxiety to 600 BID-TID which helped but caused some tiredness.  Needing trazodone more bc EMA/EFA.  She's stopped caffeine.  Mood stability is getting better despite a lot of stress. $ stress and racing thoughts at night and scattered some with stressors.  Depression is mild lately.  Took trip with H last week helped.  Coming up on 2 year anniversary of B's death.  B diabetic died last year with DM and kidney failure. Not as much anger irritability.  Some mood swings early FEB but better now. Custody battle with exH.  He's nicer to her now.   Weight going up. Hates weight gain effects of meds.    Consistent with meds.  No sig SI other than fleeting rarely.  Scattered concentration and racing thoughts randomly.   Sleep OK. Plan: No med changes/ FU 4 mos  06/18/20 TC saying she stopped all psych meds end of August.  06/22/20 appt with the following noted: Had gradually dropped off meds and not sure why.  Stoped Latuda, buspirone, CBZ. Pramipexole., and lamotrigine.  Sleep Ok without trazodone.  Gabapentin less tolerated as prn. Stopped Equetro over a month ago DT insurance.  Back on regular medicare and it might be covered. A lot of stress here lately. Lost father 01/01/20 of MI.  since here and called got propranolol and it hleped crying.  Also got some clonazepam.   Had given up on life and everything.  Not SI today but has been.  No plan.  Depression and mood swings including anger.  Anxiety. She and therapist say she's depressed and recommended IOP at [facility]. Agrees to retry Seroquel which helped in the past  Remarried April 2020.  34 yo D ADD is stressful and disrespectful. Disability review approved mostly for psych reasons.  Going to [facility] weight management and just started vitamin D. Level  24.5.  Past Psychiatric Medication Trials: Latuda 120, lamotrigine 300 twice daily, Equetro 900 daily,  brief Vraylar, lithium felt slowed down, Seroquel worked but stopped DT insurance, Abilify 15, Depakote 1500 SE, Equetro sertraline, duloxetine,  gabapentin 600 mg 3 times daily tiredness,  buspirone 30 twice daily,, clonazepam, trazodone, Xanax , hydroxyzine NR,  History of suicidal ideation and about 5 suicide attempts with about 4 psychiatric hospitalizations  Review of Systems:  Review of Systems  Gastrointestinal: Negative for nausea and vomiting.  Musculoskeletal: Positive for arthralgias.  Neurological: Negative for tremors and weakness.  Psychiatric/Behavioral: Positive for depression and sleep disturbance.    Medications: I have reviewed the patient's current medications.  Current Outpatient Medications  Medication Sig Dispense Refill  . acetaminophen (TYLENOL) 500 MG tablet Take 1,000 mg by mouth every 6 (six) hours  as needed for mild pain or moderate pain.     Marland Kitchen albuterol (PROVENTIL) (2.5 MG/3ML) 0.083% nebulizer solution Take 3 mLs (2.5 mg total) by nebulization every 6 (six) hours as needed for wheezing or shortness of breath. 150 mL 1  . albuterol (VENTOLIN HFA) 108 (90 Base) MCG/ACT inhaler Inhale 2 puffs into the lungs every 6 (six) hours as needed for wheezing or shortness of breath. 16 g 2  . clonazePAM (KLONOPIN) 0.5 MG tablet Take 1 tablet (0.5 mg total) by mouth every 8 (eight) hours as needed. for anxiety 20 tablet 0  . EMGALITY 120 MG/ML SOAJ Inject 1 mL into the skin every 30 (thirty) days.    . enalapril (VASOTEC) 20 MG tablet Take 20 mg by mouth daily.     . fluticasone (FLONASE) 50 MCG/ACT nasal spray Place 2 sprays into both nostrils daily. (Patient taking differently: Place 2 sprays into both nostrils daily as needed (seasonal allergies.). ) 16 g 2  . furosemide (LASIX) 40 MG tablet Take 40 mg by mouth every other day.     Marland Kitchen GLUCAGON EMERGENCY 1 MG injection  Inject 1 mg into the skin as needed (hypoglycemia.).     . Insulin Human (INSULIN PUMP) SOLN Inject 1 each into the skin 3 times daily with meals, bedtime and 2 AM. Novolog: 36-38 units per day    . Insulin Pen Needle 32G X 4 MM MISC 1 each by Does not apply route once a week. 50 each 0  . LATUDA 120 MG TABS TAKE 1 TABLET BY MOUTH EVERY DAY 30 tablet 5  . levonorgestrel (MIRENA, 52 MG,) 20 MCG/24HR IUD 1 each by Intrauterine route once.    . mometasone-formoterol (DULERA) 100-5 MCG/ACT AERO Inhale 2 puffs into the lungs 2 (two) times daily. 13 g 11  . montelukast (SINGULAIR) 10 MG tablet Take 1 tablet (10 mg total) by mouth at bedtime. 90 tablet 3  . NOVOLOG 100 UNIT/ML injection Inject 36-38 Units into the skin daily. Via insulin pump per sliding scale    . pantoprazole (PROTONIX) 20 MG tablet TAKE 1 TABLET BY MOUTH DAILY BEFORE BREAKFAST. TAKE AT LEAST 30 MINUTES BEFORE BREAKFAST (Patient taking differently: Take 20 mg by mouth daily with breakfast. ) 30 tablet 3  . promethazine (PHENERGAN) 25 MG tablet Take 1 tablet (25 mg total) by mouth every 8 (eight) hours as needed for nausea or vomiting. 30 tablet 0  . propranolol (INDERAL) 20 MG tablet Take 1/2-1 tabs po TID for anxiety (Patient taking differently: Take 10-20 mg by mouth 3 (three) times daily. ) 90 tablet 1  . Prucalopride Succinate (MOTEGRITY) 2 MG TABS Take 1 tablet (2 mg total) by mouth every morning. 30 tablet 1  . Semaglutide,0.25 or 0.5MG /DOS, (OZEMPIC, 0.25 OR 0.5 MG/DOSE,) 2 MG/1.5ML SOPN Inject 0.375 mLs (0.5 mg total) into the skin once a week. 1.5 mL 0  . SUMAtriptan Succinate 3 MG/0.5ML SOAJ Inject 0.5 mLs into the skin See admin instructions. To use as needed for migraines     . tiZANidine (ZANAFLEX) 4 MG tablet Take 4 mg by mouth 2 (two) times daily as needed for muscle spasms.     . traZODone (DESYREL) 50 MG tablet Take 1 tablet (50 mg total) by mouth at bedtime. (Patient taking differently: Take 50 mg by mouth at bedtime as  needed for sleep. ) 30 tablet 2  . Ubrogepant (UBRELVY) 100 MG TABS Take 100 mg by mouth daily as needed (migraine headaches.).     Marland Kitchen  Vitamin D, Ergocalciferol, (DRISDOL) 1.25 MG (50000 UNIT) CAPS capsule Take 1 capsule (50,000 Units total) by mouth every 7 (seven) days. 4 capsule 0  . Botulinum Toxin Type A (BOTOX) 200 units SOLR Inject 155 Units as directed every 3 (three) months. (Patient not taking: Reported on 06/22/2020)    . ciclopirox (PENLAC) 8 % solution Apply one coat to each toenail daily. Remove weekly with polish remover. (Patient not taking: Reported on 06/22/2020) 6.6 mL 11  . gabapentin (NEURONTIN) 600 MG tablet Take 1 tablet (600 mg total) by mouth 3 (three) times daily. (Patient not taking: Reported on 06/22/2020) 90 tablet 2  . metoCLOPramide (REGLAN) 5 MG tablet Take 1 tablet (5 mg total) by mouth every 8 (eight) hours as needed for nausea. (Patient not taking: Reported on 06/22/2020) 12 tablet 0  . Pramipexole Dihydrochloride 0.75 MG TB24 Take 1 tablet (0.75 mg total) by mouth every evening. (Patient not taking: Reported on 06/22/2020) 30 tablet 3  . QUEtiapine (SEROQUEL XR) 200 MG 24 hr tablet 1 at night for 4 nights, then 2 at night for 4 nights, then 3 at night. 90 tablet 1  . Urea 40 % LOTN Apply 1 application topically daily. Apply only to calluses 325 mL 0   No current facility-administered medications for this visit.    Medication Side Effects: tired in the afternoon.  Allergies:  Allergies  Allergen Reactions  . Toradol [Ketorolac Tromethamine]     Cannot take d/t kidney issues   . Ceftin Rash    Past Medical History:  Diagnosis Date  . Anemia   . Anxiety   . Asthma   . Asthma 11/10/2013  . Back pain   . Bipolar 1 disorder ([place])   . Chest pain   . Chronic renal failure syndrome, stage 3 (moderate)   . Complication of anesthesia   . Constipation   . Depression   . Depression   . Diabetic gastroparesis ([place])   . Diabetic neuropathy, type I diabetes  mellitus ([place])   . Edema, lower extremity   . Gallbladder disease   . Gastroparesis   . GERD (gastroesophageal reflux disease)   . Headache(784.0)   . Hypertension   . Joint pain   . Kidney stones   . Palpitations   . Polyneuropathy in diabetes(357.2)   . PONV (postoperative nausea and vomiting)   . 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 Shortness of breath   . Tachycardia    baseline tachycardia   . 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 Hypertension Other   . Diabetes Mother        type 2  . Hypertension Mother   . Obesity Mother   . Diabetes Brother        type 1  . Renal Disease Brother        renal failure  . Hypertension Brother   . Diabetes Father   . Hypertension Father   . High Cholesterol Father   . Sleep apnea Father   . Obesity Father   . Heart disease Father   . Breast cancer Maternal Aunt 64  . Colon cancer Maternal Aunt   . Uterine cancer Maternal Aunt 65       same as breast cancer  . Uterine cancer Maternal Aunt 61  . Rectal cancer Neg Hx   . Esophageal cancer Neg Hx  Social History   Socioeconomic History  . Marital status: Legally Separated    Spouse name: Not on file  . Number of children: 2  . Years of education: Not on file  . Highest education level: Not on file  Occupational History  . Occupation: Home maker  Tobacco Use  . Smoking status: Never Smoker  . Smokeless tobacco: Never Used  Vaping Use  . Vaping Use: Never used  Substance and Sexual Activity  . Alcohol use: No  . Drug use: No  . Sexual activity: Yes    Partners: Male    Birth control/protection: I.U.D.  Other Topics Concern  . Not on file  Social History Narrative   Pt has a daughter and boyfriend.    Dad passed away suddenly heart attack 12/2019   Social Determinants of Health   Financial Resource Strain:   . Difficulty of Paying Living Expenses: Not on  file  Food Insecurity:   . Worried About Charity fundraiser in the Last Year: Not on file  . Ran Out of Food in the Last Year: Not on file  Transportation Needs:   . Lack of Transportation (Medical): Not on file  . Lack of Transportation (Non-Medical): Not on file  Physical Activity:   . Days of Exercise per Week: Not on file  . Minutes of Exercise per Session: Not on file  Stress:   . Feeling of Stress : Not on file  Social Connections:   . Frequency of Communication with Friends and Family: Not on file  . Frequency of Social Gatherings with Friends and Family: Not on file  . Attends Religious Services: Not on file  . Active Member of Clubs or Organizations: Not on file  . Attends Archivist Meetings: Not on file  . Marital Status: Not on file  Intimate Partner Violence:   . Fear of Current or Ex-Partner: Not on file  . Emotionally Abused: Not on file  . Physically Abused: Not on file  . Sexually Abused: Not on file    Past Medical History, Surgical history, Social history, and Family history were reviewed and updated as appropriate.   Please see review of systems for further details on the patient's review from today.   Objective:   Physical Exam:  There were no vitals taken for this visit.  Physical Exam Constitutional:      Appearance: She is obese.  Neurological:     Mental Status: She is alert and oriented to person, place, and time.     Cranial Nerves: No dysarthria.  Psychiatric:        Attention and Perception: Attention and perception normal.        Mood and Affect: Mood is anxious. Mood is not depressed. Affect is not angry.        Speech: Speech normal.        Behavior: Behavior is cooperative.        Thought Content: Thought content normal. Thought content is not paranoid or delusional. Thought content does not include homicidal or suicidal ideation. Thought content does not include homicidal or suicidal plan.        Cognition and Memory:  Cognition and memory normal.        Judgment: Judgment normal.     Comments: Insight intact Less mood swings lately.     Lab Review:     Component Value Date/Time   NA 141 04/15/2020 1635   K 4.6 04/15/2020 1635   CL 96  04/15/2020 1635   CO2 26 04/15/2020 1635   GLUCOSE 155 (H) 04/15/2020 1635   GLUCOSE 147 (H) 10/14/2019 0920   BUN 26 (H) 04/15/2020 1635   CREATININE 1.66 (H) 04/15/2020 1635   CREATININE 1.81 (H) 06/17/2018 1231   CALCIUM 8.9 04/15/2020 1635   PROT 6.8 04/15/2020 1635   ALBUMIN 4.1 04/15/2020 1635   AST 17 04/15/2020 1635   ALT 11 04/15/2020 1635   ALKPHOS 150 (H) 04/15/2020 1635   BILITOT <0.2 04/15/2020 1635   GFRNONAA 40 (L) 04/15/2020 1635   GFRNONAA 36 (L) 06/17/2018 1231   GFRAA 46 (L) 04/15/2020 1635   GFRAA 42 (L) 06/17/2018 1231       Component Value Date/Time   WBC 6.1 04/15/2020 1635   WBC 7.9 10/14/2019 0920   RBC 4.48 04/15/2020 1635   RBC 4.52 10/14/2019 0920   HGB 13.2 04/15/2020 1635   HCT 39.7 04/15/2020 1635   PLT 256 04/15/2020 1635   MCV 89 04/15/2020 1635   MCH 29.5 04/15/2020 1635   MCH 29.6 10/14/2019 0920   MCHC 33.2 04/15/2020 1635   MCHC 32.0 10/14/2019 0920   RDW 12.3 04/15/2020 1635   LYMPHSABS 1.7 04/15/2020 1635   MONOABS 0.8 10/14/2019 0920   EOSABS 0.6 (H) 04/15/2020 1635   BASOSABS 0.1 04/15/2020 1635    No results found for: POCLITH, LITHIUM   No results found for: PHENYTOIN, PHENOBARB, VALPROATE, CBMZ   .res Assessment: Plan:    Braleigh was seen today for follow-up, other, depression, anger and memory loss.  Diagnoses and all orders for this visit:  Bipolar 1 disorder, mixed, severe -     QUEtiapine (SEROQUEL XR) 200 MG 24 hr tablet; 1 at night for 4 nights, then 2 at night for 4 nights, then 3 at night.  Generalized anxiety disorder  Noncompliance with medication regimen  Panic disorder with agoraphobia  Insomnia due to mental condition  Restless leg syndrome, controlled    Greater than  50% of 30 min non face to face time with patient was spent on counseling and coordination of care. We discussed Patient with a history of multiple med failures and history of psychiatric hospitalization and suicide attempts recent worsening of bipolar depression.  Chronic stressors contribute to mood and anxiety problems.   Concern about potential weight gain with medications.  She has taking the major mood stabilizers with low weight gain potential. Discussed the pros and cons of switching mood stabilizers.  Could consider ultra low-dose lithium.   She recently failed to tolerate VPA.  Is doing better on CBZ but there are drug interaction problems to concern [name] and she's aware.   Patient decompensated because of abruptly stopping all of her psych medications.  She had been doing reasonably well at the last appointment on multiple medications but has stopped all her meds and now is having both anger and depression and hopelessness.  She is seeing a therapist.  She brought up the idea of returning to Seroquel which had helped at some point in the past.  In the past stating it had become too expensive.  It is generic now.  She is aware it does have weight gain risk.  We discussed other side effects to watch for.  We do not know what the previous dose was so we were aim for a moderately high dose and try to use it as a solo or monotherapy.  Therefore she will not resume Latuda, carbamazepine, lamotrigine, or pramipexole.  Agree with IOP  Discussed potential metabolic side effects associated with atypical antipsychotics, as well as potential risk for movement side effects. Advised pt to contact office if movement side effects occur.   RLS Not a problem now.  quetiapine ER 1 at night for 4 nights, then 2 at night for 4 nights, then 3 at night.  Follow-up 15mos  [name], MD, DFAPA   Please see After Visit Summary for patient specific instructions.  Future Appointments  Date Time Provider [place]  06/28/2020 11:00 AM Hussami, [name], LCSW [facility] None  06/30/2020  8:40 AM Briscoe Deutscher, DO [facility] None  07/13/2020  5:00 PM Cottle, Billey Co., MD [facility] None  08/10/2020  1:30 PM Cottle, Billey Co., MD [facility] None    No orders of the defined types were placed in this encounter.   -------------------------------

## 2020-06-22 NOTE — Telephone Encounter (Signed)
To avoid the PA you can reduce the quantity to 30.  The point of the quantity was to allow the patient to build up the dose gradually as RX

## 2020-06-22 NOTE — Telephone Encounter (Signed)
Looks like you saw her today and Rx Seroquel. I'm sure it's due to quantity you wrote. I can submit a PA but it will take a day or 2 to get approved I'm sure.

## 2020-06-22 NOTE — Telephone Encounter (Signed)
Pt called again. Talked to pharmacy and Medicare is not wanting to pay for the amount of pills Quetiapine Rx is written for. Call Medicare to advise medically necessary.  Pt ask if you  change Rx to higher mg with fewer pills?

## 2020-06-22 NOTE — Patient Instructions (Signed)
Stop Latuda  Start Seroquel ER (quetiapine ER) 1 at night for 4 nights, then 2 at night for 4 nights, then 3 at night.  OK propranolol but no other psych meds without our ok.

## 2020-06-22 NOTE — Telephone Encounter (Signed)
[facility] [place] location [facility] @ [facility]. [facility]. Pt advised Insurance dose not cover. Pt would like to know what CC would like her to take. Contact @ [phone]

## 2020-06-23 ENCOUNTER — Other Ambulatory Visit: Payer: Self-pay

## 2020-06-23 ENCOUNTER — Ambulatory Visit (INDEPENDENT_AMBULATORY_CARE_PROVIDER_SITE_OTHER): Payer: Medicare Other | Admitting: Family Medicine

## 2020-06-23 DIAGNOSIS — F3162 Bipolar disorder, current episode mixed, moderate: Secondary | ICD-10-CM

## 2020-06-23 DIAGNOSIS — F430 Acute stress reaction: Secondary | ICD-10-CM

## 2020-06-23 DIAGNOSIS — F4001 Agoraphobia with panic disorder: Secondary | ICD-10-CM

## 2020-06-23 MED ORDER — QUETIAPINE FUMARATE ER 200 MG PO TB24: ORAL_TABLET | ORAL | 0 refills | Status: DC

## 2020-06-23 MED ORDER — CLONAZEPAM 0.5 MG PO TABS: 0.5000 mg | ORAL_TABLET | Freq: Three times a day (TID) | ORAL | 0 refills | Status: DC | PRN

## 2020-06-23 NOTE — Telephone Encounter (Signed)
Contacted patient that her RX would be changed to quetiapine XR 200 mg 1 q hs #30 but she was still to do as instructed. Take 1 at hs for 4 nights, 2 at hs for 4 nights, then 3 at hs. Sent Rx for # 30.  [facility] [address] due to [facility] [place] it's not available.   She also needs refill for Cl[facility]epam [facility]. It's pended for Dr. Clovis Pu to send.

## 2020-06-24 ENCOUNTER — Telehealth: Payer: Self-pay | Admitting: Psychiatry

## 2020-06-24 ENCOUNTER — Other Ambulatory Visit: Payer: Self-pay

## 2020-06-24 ENCOUNTER — Other Ambulatory Visit: Payer: Self-pay | Admitting: Psychiatry

## 2020-06-24 ENCOUNTER — Encounter (INDEPENDENT_AMBULATORY_CARE_PROVIDER_SITE_OTHER): Payer: Self-pay | Admitting: Family Medicine

## 2020-06-24 DIAGNOSIS — J45901 Unspecified asthma with (acute) exacerbation: Secondary | ICD-10-CM

## 2020-06-24 MED ORDER — GABAPENTIN 100 MG PO CAPS
ORAL_CAPSULE | ORAL | 1 refills | Status: DC
Start: 2020-06-24 — End: 2020-08-10

## 2020-06-24 MED ORDER — ALBUTEROL SULFATE (2.5 MG/3ML) 0.083% IN NEBU: 2.5000 mg | INHALATION_SOLUTION | Freq: Four times a day (QID) | RESPIRATORY_TRACT | 1 refills | Status: AC | PRN

## 2020-06-24 NOTE — Telephone Encounter (Signed)
Please review

## 2020-06-24 NOTE — Telephone Encounter (Signed)
I called in prescription for gabapentin 100 mg capsules 2 at 6 PM and 2 at 8 or 9 PM and that should lessen any side effects during the day.  Also gives her flexibility to dose it anywhere between 200 mg and 400 mg daily.

## 2020-06-24 NOTE — Telephone Encounter (Signed)
[name] called to report that she started the Seroquel and it again caused RLS.  But she wants to work through thus and give Seroquel a chance to work.  You have given her gabapentin at 600mg  but this is really too strong because she feels very groggy through the next morning.  Perhaps if you lower the dose of the gabapentin it will still help with the RLS and she won't feel so groggy the next day.  Please call in a lower dose of the gabapentin and/or call to discuss options.

## 2020-06-25 NOTE — Telephone Encounter (Signed)
Patient aware and picked up gabapentin last night.

## 2020-06-28 ENCOUNTER — Ambulatory Visit (INDEPENDENT_AMBULATORY_CARE_PROVIDER_SITE_OTHER): Payer: Medicare Other | Admitting: Licensed Clinical Social Worker

## 2020-06-28 ENCOUNTER — Other Ambulatory Visit: Payer: Self-pay

## 2020-06-28 DIAGNOSIS — F411 Generalized anxiety disorder: Secondary | ICD-10-CM | POA: Diagnosis not present

## 2020-06-28 DIAGNOSIS — F3162 Bipolar disorder, current episode mixed, moderate: Secondary | ICD-10-CM | POA: Diagnosis not present

## 2020-06-28 NOTE — Progress Notes (Signed)
Virtual Visit via Telephone Note  I connected with Kurt Azimi on 06/28/20 at 10:00 AM EDT by an audio enabled telemedicine application and verified that I am speaking with the correct person using two identifiers.  Location: Patient: home Provider: [facility]   I discussed the limitations of evaluation and management by telemedicine and the availability of in person appointments. The patient expressed understanding and agreed to proceed.   The patient was advised to call back or seek an in-person evaluation if the symptoms worsen or if the condition fails to improve as anticipated.  I provided 30 minutes of non-face-to-face time during this encounter.   Williamson Cavanah R Alpha Mysliwiec, LCSW    THERAPIST PROGRESS NOTE  Session Time: 10:00-10:30a  Participation Level: Active  Behavioral Response: NAAlertimproving; generally pleasant  Type of Therapy: Individual Therapy  Treatment Goals addressed: Anxiety and Coping  Interventions: Supportive  Summary: [name] is a 34 y.o. female who presents with improving symptoms related to her diagnosis (bipolar). Pt reports that her mood is more stable, her anxiety levels are manageable, and good quality and quantity of sleep.  Pt reports that she had an office visit with psychiatrist recently and is now back on medication. Pt reports feeling fewer symptoms and better overall mood since going back on medication. Pt also reports a decrease in overall environmental stressors--has now moved most items out of the house and in with her MIL.    Pt reports that she discussed intensive outpatient therapy with Dr. Clovis Pu at her last appt with him and he feels it would be a good adjunct treatment.    Suicidal/Homicidal: No  SI, HI, or AVH reported at time of session.  Therapist Response: Lelaina reports that mood is more stable and that anxiety symptoms have decreased since last session, which is indicative of progress.  Plan: Return again in  Weeks.   Ongoing treatment plan to include medication compliance, psychiatric assistance, mood management, stress/anxiety management.  Referral sent for intensive outpatient (2nd request).   Diagnosis: Axis I: Bipolar, Depressed    Axis II: No diagnosis    [name] Louvina Cleary, LCSW 06/28/2020

## 2020-06-30 ENCOUNTER — Other Ambulatory Visit: Payer: Self-pay

## 2020-06-30 ENCOUNTER — Encounter (INDEPENDENT_AMBULATORY_CARE_PROVIDER_SITE_OTHER): Payer: Self-pay | Admitting: Family Medicine

## 2020-06-30 ENCOUNTER — Ambulatory Visit (INDEPENDENT_AMBULATORY_CARE_PROVIDER_SITE_OTHER): Payer: Medicare Other | Admitting: Family Medicine

## 2020-06-30 ENCOUNTER — Telehealth (HOSPITAL_COMMUNITY): Payer: Self-pay | Admitting: Psychiatry

## 2020-06-30 VITALS — BP 96/56 | HR 104 | Temp 98.3°F | Ht 66.0 in | Wt 198.0 lb

## 2020-06-30 DIAGNOSIS — E1040 Type 1 diabetes mellitus with diabetic neuropathy, unspecified: Secondary | ICD-10-CM | POA: Diagnosis not present

## 2020-06-30 DIAGNOSIS — E669 Obesity, unspecified: Secondary | ICD-10-CM | POA: Diagnosis not present

## 2020-06-30 DIAGNOSIS — E1065 Type 1 diabetes mellitus with hyperglycemia: Secondary | ICD-10-CM | POA: Diagnosis not present

## 2020-06-30 DIAGNOSIS — E1069 Type 1 diabetes mellitus with other specified complication: Secondary | ICD-10-CM | POA: Diagnosis not present

## 2020-06-30 DIAGNOSIS — E559 Vitamin D deficiency, unspecified: Secondary | ICD-10-CM

## 2020-06-30 DIAGNOSIS — N189 Chronic kidney disease, unspecified: Secondary | ICD-10-CM

## 2020-06-30 DIAGNOSIS — F3163 Bipolar disorder, current episode mixed, severe, without psychotic features: Secondary | ICD-10-CM

## 2020-06-30 DIAGNOSIS — Z6832 Body mass index (BMI) 32.0-32.9, adult: Secondary | ICD-10-CM | POA: Diagnosis not present

## 2020-06-30 MED ORDER — OZEMPIC (1 MG/DOSE) 4 MG/3ML ~~LOC~~ SOPN: 1.0000 mg | PEN_INJECTOR | SUBCUTANEOUS | 0 refills | Status: DC

## 2020-06-30 NOTE — Telephone Encounter (Signed)
D:  Eden Lathe, LCSW ([place] [facility]) referred pt to [facility].  A:  Placed call to orient and provide pt with a start date.  Pt declined due to [facility] only accepting the Medicare and not Medicaid.  "My husband is out of work at this time and there is no way that I can afford it."  Informed pt that [facility] would bill her and she could make payment arrangements; but pt still declined.  A:  Inform Christina H, LCSW.  Encouraged pt to contact case manager if she changes her mind.  R:  Pt receptive.

## 2020-07-01 DIAGNOSIS — H50111 Monocular exotropia, right eye: Secondary | ICD-10-CM | POA: Diagnosis not present

## 2020-07-01 DIAGNOSIS — Z9889 Other specified postprocedural states: Secondary | ICD-10-CM | POA: Insufficient documentation

## 2020-07-01 LAB — COMPREHENSIVE METABOLIC PANEL
ALT: 10 IU/L (ref 0–32)
AST: 16 IU/L (ref 0–40)
Albumin/Globulin Ratio: 1.5 (ref 1.2–2.2)
Albumin: 4.5 g/dL (ref 3.8–4.8)
Alkaline Phosphatase: 173 IU/L — ABNORMAL HIGH (ref 44–121)
BUN/Creatinine Ratio: 15 (ref 9–23)
BUN: 29 mg/dL — ABNORMAL HIGH (ref 6–20)
Bilirubin Total: <0.2 mg/dL (ref 0.0–1.2)
CO2: 28 mmol/L (ref 20–29)
Calcium: 9.9 mg/dL (ref 8.7–10.2)
Chloride: 95 mmol/L — ABNORMAL LOW (ref 96–106)
Creatinine, Ser: 1.94 mg/dL — ABNORMAL HIGH (ref 0.57–1.00)
GFR calc Af Amer: 38 mL/min/{1.73_m2} — ABNORMAL LOW (ref >59)
GFR calc non Af Amer: 33 mL/min/{1.73_m2} — ABNORMAL LOW (ref >59)
Globulin, Total: 3 g/dL (ref 1.5–4.5)
Glucose: 52 mg/dL — ABNORMAL LOW (ref 65–99)
Potassium: 5.3 mmol/L — ABNORMAL HIGH (ref 3.5–5.2)
Sodium: 137 mmol/L (ref 134–144)
Total Protein: 7.5 g/dL (ref 6.0–8.5)

## 2020-07-01 LAB — VITAMIN D 25 HYDROXY (VIT D DEFICIENCY, FRACTURES): Vit D, 25-Hydroxy: 32 ng/mL (ref 30.0–100.0)

## 2020-07-01 NOTE — Progress Notes (Signed)
Chief Complaint:   OBESITY [name] is here to discuss her progress with her obesity treatment plan along with follow-up of her obesity related diagnoses. [name] is on keeping a food journal and adhering to recommended goals of 1500 calories and 95 grams of protein and states she is following her eating plan approximately 50% of the time. [name] states she is exercising for 0 minutes 0 times per week.  Today's visit was #: 8 Starting weight: 199 lbs Starting date: 11/13/2019 Today's weight: 198 lbs Today's date: 06/30/2020 Total lbs lost to date: 1 lb Total lbs lost since last in-office visit: 0 Total weight loss percentage to date: -0.50%  Interim History: [name] says she has been having cravings and eating sweets.  She is tolerating Ozempic 0.5 mg.  She says she stopped all medications a few weeks ago.  She saw Dr. Clovis Pu and started Seroquel and added Neurontin for RLS.  She is seeing a therapist.  She will be doing IOP.  Assessment/Plan:   1. Chronic kidney disease Lab results and trends reviewed. We discussed several lifestyle modifications today and she will continue to work on diet, exercise and weight loss efforts. Avoid nephrotoxic medications. Orders and follow up as documented in patient record.  Creatinine 1.66 and GFR 40.  Lab Results  Component Value Date   CREATININE 1.94 (H) 06/30/2020   CREATININE 1.66 (H) 04/15/2020   CREATININE 1.54 (H) 03/01/2020   Lab Results  Component Value Date   CREATININE 1.94 (H) 06/30/2020   BUN 29 (H) 06/30/2020   NA 137 06/30/2020   K 5.3 (H) 06/30/2020   CL 95 (L) 06/30/2020   CO2 28 06/30/2020   2. Type 1 diabetes mellitus with other specified complication (HCC) Worsened.  Will increase Ozempic, as per below, to control appetite so that she can make healthy food choices. She will continue to focus on protein-rich, low simple carbohydrate foods. We reviewed the importance of hydration, regular exercise for stress reduction,  and restorative sleep.   Lab Results  Component Value Date   HGBA1C 9.5 (H) 04/15/2020   HGBA1C 8.0 (H) 09/09/2019   HGBA1C 9.0 04/05/2017   Lab Results  Component Value Date   LDLCALC 68 04/22/2014   CREATININE 1.94 (H) 06/30/2020   - Comprehensive metabolic panel - Increase Semaglutide, 1 MG/DOSE, (OZEMPIC, 1 MG/DOSE,) 4 MG/3ML SOPN; Inject 1 mg into the skin once a week.  Dispense: 3 mL; Refill: 0  3. Vitamin D deficiency Current vitamin D is 40.5, tested on 03/01/2020. Not at goal. Optimal goal > 50 ng/dL. There is also evidence to support a goal of >70 ng/dL in patients with cancer and heart disease. Plan: Continue Vitamin D @50 ,000 IU every week with follow-up for routine testing of Vitamin D at least 2-3 times per year to avoid over-replacement.  -Refill VITAMIN D 25 Hydroxy (Vit-D Deficiency, Fractures)  4. Bipolar 1 disorder, mixed, severe (HCC) [name] is followed by Psychiatry. Will continue to monitor symptoms as they relate to her weight loss journey. This issue directly impacts care plan for optimization of BMI and metabolic health as it impacts the patient's ability to make lifestyle changes.  5. Class 1 obesity with serious comorbidity and body mass index (BMI) of 32.0 to 32.9 in adult, unspecified obesity type [name] is currently in the action stage of change. As such, her goal is to continue with weight loss efforts. She has agreed to keeping a food journal and adhering to recommended goals of 1500  calories and 95 grams of protein.   Exercise goals: For substantial health benefits, adults should do at least 150 minutes (2 hours and 30 minutes) a week of moderate-intensity, or 75 minutes (1 hour and 15 minutes) a week of vigorous-intensity aerobic physical activity, or an equivalent combination of moderate- and vigorous-intensity aerobic activity. Aerobic activity should be performed in episodes of at least 10 minutes, and preferably, it should be spread throughout the  week.  Behavioral modification strategies: increasing water intake and keeping a strict food journal.  [name] has agreed to follow-up with our clinic in 2-3 weeks. She was informed of the importance of frequent follow-up visits to maximize her success with intensive lifestyle modifications for her multiple health conditions.   [name] was informed we would discuss her lab results at her next visit unless there is a critical issue that needs to be addressed sooner. [name] agreed to keep her next visit at the agreed upon time to discuss these results.  Objective:   Blood pressure (!) 96/56, pulse (!) 104, temperature 98.3 F (36.8 C), temperature source Oral, height 5\' 6"  (1.676 m), weight 198 lb (89.8 kg), SpO2 98 %. Body mass index is 31.96 kg/m.  General: Cooperative, alert, well developed, in no acute distress. HEENT: Conjunctivae and lids unremarkable. Cardiovascular: Regular rhythm.  Lungs: Normal work of breathing. Neurologic: No focal deficits.   Lab Results  Component Value Date   CREATININE 1.94 (H) 06/30/2020   BUN 29 (H) 06/30/2020   NA 137 06/30/2020   K 5.3 (H) 06/30/2020   CL 95 (L) 06/30/2020   CO2 28 06/30/2020   Lab Results  Component Value Date   ALT 10 06/30/2020   AST 16 06/30/2020   ALKPHOS 173 (H) 06/30/2020   BILITOT <0.2 06/30/2020   Lab Results  Component Value Date   HGBA1C 9.5 (H) 04/15/2020   HGBA1C 8.0 (H) 09/09/2019   HGBA1C 9.0 04/05/2017   HGBA1C 11.2 (H) 11/24/2015   HGBA1C 13.3 (H) 04/22/2014   Lab Results  Component Value Date   TSH 1.530 04/15/2020   Lab Results  Component Value Date   CHOL 149 04/22/2014   HDL 71 04/22/2014   LDLCALC 68 04/22/2014   TRIG 49 04/22/2014   CHOLHDL 2.1 04/22/2014   Lab Results  Component Value Date   WBC 6.1 04/15/2020   HGB 13.2 04/15/2020   HCT 39.7 04/15/2020   MCV 89 04/15/2020   PLT 256 04/15/2020   Lab Results  Component Value Date   IRON 41 11/13/2019   TIBC 267 11/13/2019    FERRITIN 77 11/13/2019   Obesity Behavioral Intervention:   Approximately 15 minutes were spent on the discussion below.  ASK: We discussed the diagnosis of obesity with [name] today and [name] agreed to give [name] permission to discuss obesity behavioral modification therapy today.  ASSESS: [name] has the diagnosis of obesity and her BMI today is 32.0. [name] is in the action stage of change.   ADVISE: [name] was educated on the multiple health risks of obesity as well as the benefit of weight loss to improve her health. She was advised of the need for long term treatment and the importance of lifestyle modifications to improve her current health and to decrease her risk of future health problems.  AGREE: Multiple dietary modification options and treatment options were discussed and [name] agreed to follow the recommendations documented in the above note.  ARRANGE: Lakeria was educated on the importance of frequent visits to treat obesity  as outlined per CMS and USPSTF guidelines and agreed to schedule her next follow up appointment today.  Attestation Statements:   Reviewed by clinician on day of visit: allergies, medications, problem list, medical history, surgical history, family history, social history, and previous encounter notes.  I, Water quality scientist, CMA, am acting as transcriptionist for Briscoe Deutscher, DO  I have reviewed the above documentation for accuracy and completeness, and I agree with the above. Briscoe Deutscher, DO

## 2020-07-05 ENCOUNTER — Telehealth: Payer: Self-pay | Admitting: Psychiatry

## 2020-07-05 ENCOUNTER — Other Ambulatory Visit: Payer: Self-pay

## 2020-07-05 ENCOUNTER — Other Ambulatory Visit: Payer: Self-pay | Admitting: Psychiatry

## 2020-07-05 DIAGNOSIS — F3162 Bipolar disorder, current episode mixed, moderate: Secondary | ICD-10-CM

## 2020-07-05 MED ORDER — QUETIAPINE FUMARATE ER 300 MG PO TB24: 600.0000 mg | ORAL_TABLET | Freq: Every day | ORAL | 1 refills | Status: DC

## 2020-07-05 NOTE — Telephone Encounter (Signed)
Per last phone message patient taking Quetiapine XR 200 mg 3 at hs.  Change the strength?

## 2020-07-05 NOTE — Telephone Encounter (Signed)
Yes.  RX sent for quetiapine XR 300 mg 2 HS

## 2020-07-05 NOTE — Telephone Encounter (Signed)
Reviewed thanks! 

## 2020-07-05 NOTE — Telephone Encounter (Signed)
Pt has an appt on 10/5. Requesting refill on Seroquil 600 mg called to [facility] [address] in [place], [place]

## 2020-07-07 ENCOUNTER — Other Ambulatory Visit: Payer: Self-pay

## 2020-07-07 DIAGNOSIS — F3162 Bipolar disorder, current episode mixed, moderate: Secondary | ICD-10-CM

## 2020-07-07 MED ORDER — QUETIAPINE FUMARATE ER 300 MG PO TB24: 600.0000 mg | ORAL_TABLET | Freq: Every day | ORAL | 1 refills | Status: DC

## 2020-07-07 NOTE — Progress Notes (Signed)
[facility] notified us that her Rx for Seroquel XR 300 mg had 2 sets of instructions, updated Rx to 2 tablets at bedtime.

## 2020-07-09 ENCOUNTER — Telehealth: Payer: Self-pay | Admitting: Gastroenterology

## 2020-07-09 MED ORDER — METOCLOPRAMIDE HCL 5 MG PO TABS: 5.0000 mg | ORAL_TABLET | Freq: Three times a day (TID) | ORAL | 0 refills | Status: DC

## 2020-07-09 NOTE — Telephone Encounter (Signed)
I called and make [name] aware. She agrees to the plan and will give an update in a few weeks.

## 2020-07-09 NOTE — Telephone Encounter (Signed)
Pt called to inform Dr. Loletha Carrow that she is no longer taking Latuda so she is wondering if he can prescribe reglan again. If so, please send [facility] to [facility] [address]

## 2020-07-09 NOTE — Telephone Encounter (Signed)
Vivien Rota,  Thank you for the note.  In that case, then I agree with resuming the metoclopramide because it has previously been very helpful to control her diabetic gastroparesis.  I have sent the prescription to the pharmacy.  Please ask her to reach out to [name] in a couple of weeks with how that is going, and we can adjust dose and frequency as needed.  - HD  ______________________________________  Danae Chen,  I am putting this patient back on metoclopramide.  I had previously stopped it because she was on Latuda, and I was concerned about the additive risks of TD.  She sent [name] amessage today that she recently stopped Latuda.  The metoclopramide had previously been very helpful to her to control diabetic gastroparesis nausea and vomiting, and I hope it will therefore help control her glucose.  Mallie Mussel

## 2020-07-09 NOTE — Telephone Encounter (Signed)
Please advise 

## 2020-07-09 NOTE — Addendum Note (Signed)
Addended by: Nelida Meuse on: 07/09/2020 01:36 PM   Modules accepted: Orders

## 2020-07-11 ENCOUNTER — Other Ambulatory Visit: Payer: Self-pay | Admitting: Psychiatry

## 2020-07-12 NOTE — Telephone Encounter (Signed)
Please review

## 2020-07-13 ENCOUNTER — Ambulatory Visit (INDEPENDENT_AMBULATORY_CARE_PROVIDER_SITE_OTHER): Payer: Medicare Other | Admitting: Psychiatry

## 2020-07-13 ENCOUNTER — Encounter: Payer: Self-pay | Admitting: Psychiatry

## 2020-07-13 ENCOUNTER — Other Ambulatory Visit: Payer: Self-pay

## 2020-07-13 ENCOUNTER — Ambulatory Visit (INDEPENDENT_AMBULATORY_CARE_PROVIDER_SITE_OTHER): Payer: Medicare Other | Admitting: Licensed Clinical Social Worker

## 2020-07-13 DIAGNOSIS — F4001 Agoraphobia with panic disorder: Secondary | ICD-10-CM

## 2020-07-13 DIAGNOSIS — Z9114 Patient's other noncompliance with medication regimen: Secondary | ICD-10-CM

## 2020-07-13 DIAGNOSIS — F411 Generalized anxiety disorder: Secondary | ICD-10-CM

## 2020-07-13 DIAGNOSIS — F5105 Insomnia due to other mental disorder: Secondary | ICD-10-CM

## 2020-07-13 DIAGNOSIS — F3162 Bipolar disorder, current episode mixed, moderate: Secondary | ICD-10-CM | POA: Diagnosis not present

## 2020-07-13 DIAGNOSIS — G2581 Restless legs syndrome: Secondary | ICD-10-CM

## 2020-07-13 NOTE — Progress Notes (Signed)
Virtual Visit via Telephone Note  I connected with [name] on 07/13/20 at 12:30 PM EDT by telephone and verified that I am speaking with the correct person using two identifiers.  Location: Patient: home Provider: [facility]   I discussed the limitations, risks, security and privacy concerns of performing an evaluation and management service by telephone and the availability of in person appointments. I also discussed with the patient that there may be a patient responsible charge related to this service. The patient expressed understanding and agreed to proceed.  The patient was advised to call back or seek an in-person evaluation if the symptoms worsen or if the condition fails to improve as anticipated.  I provided 45 minutes of non-face-to-face time during this encounter.   Trisa Cranor R Tishara Pizano, LCSW   THERAPIST PROGRESS NOTE  Session Time: 12:45-1:30p  Participation Level: Active  Behavioral Response: NAAlertAnxious  Type of Therapy: Individual Therapy  Treatment Goals addressed: Coping  Interventions: CBT and Supportive  Summary: [name] is a 34 y.o. female who presents with improving symptoms related to diagnosis (bipolar, anxiety).  Pt reports improved sleep quality and quantity. Pt reports good appetite. Pt presented with a more positive affect than in previous sessions.  Allowed pt to explore thoughts and feelings about current stressors: adjustment after moving, moving daughter to a new school, relationship concerns, overall mood management.   Pt feels that medication changes are helping her manage mood and anxiety/stress much better.  Pt feels that crying spells have decreased and panic attack episodes have decreased.   Reviewed CBT skills: mindfulness, grounding, breathing, positive behavioral activities, social engagement. Encouraged pt to continue focusing on self care and keeping life in balance.  Suicidal/Homicidal: No  SI, HI, or AVH reported at time  of session.  Therapist Response: [name] continues to make good progress with mood management and anxiety/stress management. Pt reports decrease in crying spells and panic attacks, which is reflective of continuing progress.  Plan: Return again in 3 weeks. The ongoing treatment plan includes maintaining current levels of progress and continuing to build skills to manage mood, improve stress/anxiety management, emotion regulation, distress tolerance, and behavior modification.   Diagnosis: Axis I: Bipolar, mixed and Generalized Anxiety Disorder    Axis II: No diagnosis    [name] Ronak Duquette, LCSW 07/13/2020

## 2020-07-13 NOTE — Progress Notes (Signed)
Else Habermann [id number] [birth year] 34 y.o.   Virtual Visit via [place]  I connected with pt by WebEx and verified that I am speaking with the correct person using two identifiers.   I discussed the limitations, risks, security and privacy concerns of performing an evaluation and management service by [name] and the availability of in person appointments. I also discussed with the patient that there may be a patient responsible charge related to this service. The patient expressed understanding and agreed to proceed.  I discussed the assessment and treatment plan with the patient. The patient was provided an opportunity to ask questions and all were answered. The patient agreed with the plan and demonstrated an understanding of the instructions.   The patient was advised to call back or seek an in-person evaluation if the symptoms worsen or if the condition fails to improve as anticipated.  I provided 30 minutes of video time during this encounter. The call started at 330 and ended at 4:00. The patient was located at home and the provider was located office.   Subjective:   Patient ID:  [name] is a 34 y.o. (DOB September 14, 1986) female.  Chief Complaint:  Chief Complaint  Patient presents with  . Follow-up  . Sleeping Problem  . Depression  . Anxiety    Depression        [name] presents to the office today for follow-up of bipolar disorder and generalized anxiety disorder.  When seen June 05, 2019.  The following changes were made: Change pramipexole ER and increase to 0.75 mg pm to rid nausea and improve mood in the afternoon.  For tiredness in the afternoon switch Equetro to 400 mg AM , 1@ 6 and 1 at 11. For panic in daytime start gabapentin 300 mg each am as preventative.  She's only taking it prn now. We continued unchanged Latuda 120 mg and lamotrigine 300 mg twice daily for bipolar disorder. She just got the ER pramipexole about a week ago.  Can't tell  difference with nausea DT  gastroparesis worse lately with constipation Less afternoon tiredness with current split Equetro dose.    Last seen July 22, 2019.  Meds were not changed at that visit.  She called back to October 20 wanting to try valproate because of racing thoughts and difficulty sleeping.  Stating the trazodone was not working.  She was told it was okay to start Depakote ER 500 mg nightly and then gradually increased to 1500 mg nightly.  She called back again on October 6 and this physician noted the following: RTC Pt called to report new med she started having a lot of side effects mood swings, sounds in ears, suicidal thoughts, tremors. Causing issues with husband She is currently taking Depakote ER 1500 mg nightly. She commits to safety. The only symptom directly attributable to Depakote would be the tremor.  However its presence would indicate she is not going to be able to tolerate a higher dose of Depakote to control her mood symptoms.  Therefore we will wean the Depakote. In order to control the mood symptoms we will increase the Equetro at the evening dose which should not cause significant side effects problems during the day but help with mood stability.  This increase should also make it easier to wean the Depakote quickly.  She agrees to this plan.  She will call back if the suicidal thoughts become too intense to tolerate. Reduce Depakote ER 500 mg tablets to 1 a night for 4  nights then stop Increase Equetro 200 mg capsules to 2 in the morning and 3 nightly. [name], MD, DFAPA  She's been back on Equetro and doing OK. 2 weeks leading to Xmas with a lot of anger and lashing out a good bit.  Had run out of mirapex and Latuda for a couple of days in that time frame.  Crying is less now.  No SE CBZ now.  A lot of migraines off her Trokendi bc risk kidney stones.  Plans Botox for HA.  Usually sleeping OK without trazodone.   Still taking Latuda, buspirone,  gabapentin, lamotrigine 300 mg BID.    seen 12.30.21 without med change.    12/17/19 appt with the following noted: Since then increased gabapentin for anxiety to 600 BID-TID which helped but caused some tiredness.  Needing trazodone more bc EMA/EFA.  She's stopped caffeine.  Mood stability is getting better despite a lot of stress. $ stress and racing thoughts at night and scattered some with stressors.  Depression is mild lately.  Took trip with H last week helped.  Coming up on 2 year anniversary of B's death.  B diabetic died last year with DM and kidney failure. Not as much anger irritability.  Some mood swings early FEB but better now. Custody battle with exH.  He's nicer to her now.   Weight going up. Hates weight gain effects of meds.    Consistent with meds.  No sig SI other than fleeting rarely.  Scattered concentration and racing thoughts randomly.   Sleep OK. Plan: No med changes/ FU 4 mos  06/18/20 TC saying she stopped all psych meds end of August.  06/22/20 appt with the following noted: Had gradually dropped off meds and not sure why.  Stoped Latuda, buspirone, CBZ. Pramipexole., and lamotrigine.  Sleep Ok without trazodone.  Gabapentin less tolerated as prn. Stopped Equetro over a month ago DT insurance.  Back on regular medicare and it might be covered. A lot of stress here lately. Lost father 01/01/20 of MI.  since here and called got propranolol and it hleped crying.  Also got some clonazepam.   Had given up on life and everything.  Not SI today but has been.  No plan.  Depression and mood swings including anger.  Anxiety. She and therapist say she's depressed and recommended IOP at [facility]. Agrees to retry Seroquel which helped in the past Plan: Acute decompensation because of psychiatric noncompliance. quetiapine ER 1 at night for 4 nights, then 2 at night for 4 nights, then 3 at night.  07/13/2020 appointment with the following noted: Patient called in between appointments  complaining of quetiapine call causing restless legs.  Gabapentin was called in with instructions to make this side effect resolve. Misunderstood and only taking 300 mg Seroquel XR for a couple of days. Referred to [facility] and has continued therapy. Feels better on Seroquel.  No longer SI.  More stable.  Less anger.  Depression down to mild generally.  Fewer panic attacks. Sleeps with Seroquel 8 hours.  RLS is managed fairly well at this time. Initially too tired with Seroquel and better now. Clonazepam once in 2 weeks for panic driving.  Remarried April 2020.  34 yo D ADD is stressful and disrespectful. Disability review approved mostly for psych reasons.  Going to [facility] weight management and just started vitamin D. Level 24.5.  Past Psychiatric Medication Trials: Latuda 120, lamotrigine 300 twice daily, Equetro 900 daily,  brief Vraylar, lithium felt slowed  down, Seroquel worked but stopped DT insurance, Abilify 15, Depakote 1500 SE, Equetro sertraline, duloxetine,  gabapentin 600 mg 3 times daily tiredness,  buspirone 30 twice daily,, clonazepam, trazodone, Xanax , hydroxyzine NR,  History of suicidal ideation and about 5 suicide attempts with about 4 psychiatric hospitalizations  Review of Systems:  Review of Systems  Cardiovascular: Negative for palpitations.  Gastrointestinal: Negative for nausea and vomiting.  Musculoskeletal: Positive for arthralgias.  Neurological: Negative for tremors and weakness.  Psychiatric/Behavioral: Positive for depression and sleep disturbance.    Medications: I have reviewed the patient's current medications.  Current Outpatient Medications  Medication Sig Dispense Refill  . acetaminophen (TYLENOL) 500 MG tablet Take 1,000 mg by mouth every 6 (six) hours as needed for mild pain or moderate pain.     Marland Kitchen albuterol (PROVENTIL) (2.5 MG/3ML) 0.083% nebulizer solution Take 3 mLs (2.5 mg total) by nebulization every 6 (six) hours as needed for  wheezing or shortness of breath. 150 mL 1  . albuterol (VENTOLIN HFA) 108 (90 Base) MCG/ACT inhaler Inhale 2 puffs into the lungs every 6 (six) hours as needed for wheezing or shortness of breath. 16 g 2  . Botulinum Toxin Type A (BOTOX) 200 units SOLR Inject 155 Units as directed every 3 (three) months.     . ciclopirox (PENLAC) 8 % solution Apply one coat to each toenail daily. Remove weekly with polish remover. 6.6 mL 11  . clonazePAM (KLONOPIN) 0.5 MG tablet Take 1 tablet (0.5 mg total) by mouth every 8 (eight) hours as needed. for anxiety 20 tablet 0  . EMGALITY 120 MG/ML SOAJ Inject 1 mL into the skin every 30 (thirty) days.    . enalapril (VASOTEC) 20 MG tablet Take 20 mg by mouth daily.     . fluticasone (FLONASE) 50 MCG/ACT nasal spray Place 2 sprays into both nostrils daily. (Patient taking differently: Place 2 sprays into both nostrils daily as needed (seasonal allergies.). ) 16 g 2  . furosemide (LASIX) 40 MG tablet Take 40 mg by mouth every other day.     . gabapentin (NEURONTIN) 100 MG capsule 2 capsules at 6 PM and 2 capsules at 9 PM (Patient taking differently: Take 200 mg by mouth at bedtime. ) 120 capsule 1  . GLUCAGON EMERGENCY 1 MG injection Inject 1 mg into the skin as needed (hypoglycemia.).     . Insulin Human (INSULIN PUMP) SOLN Inject 1 each into the skin 3 times daily with meals, bedtime and 2 AM. Novolog: 36-38 units per day    . Insulin Pen Needle 32G X 4 MM MISC 1 each by Does not apply route once a week. 50 each 0  . levonorgestrel (MIRENA, 52 MG,) 20 MCG/24HR IUD 1 each by Intrauterine route once.    . metoCLOPramide (REGLAN) 5 MG tablet Take 1 tablet (5 mg total) by mouth 3 (three) times daily before meals. 90 tablet 0  . mometasone-formoterol (DULERA) 100-5 MCG/ACT AERO Inhale 2 puffs into the lungs 2 (two) times daily. 13 g 11  . montelukast (SINGULAIR) 10 MG tablet Take 1 tablet (10 mg total) by mouth at bedtime. 90 tablet 3  . NOVOLOG 100 UNIT/ML injection Inject  36-38 Units into the skin daily. Via insulin pump per sliding scale    . pantoprazole (PROTONIX) 20 MG tablet TAKE 1 TABLET BY MOUTH DAILY BEFORE BREAKFAST. TAKE AT LEAST 30 MINUTES BEFORE BREAKFAST (Patient taking differently: Take 20 mg by mouth daily with breakfast. ) 30 tablet 3  .  promethazine (PHENERGAN) 25 MG tablet Take 1 tablet (25 mg total) by mouth every 8 (eight) hours as needed for nausea or vomiting. 30 tablet 0  . Prucalopride Succinate (MOTEGRITY) 2 MG TABS Take 1 tablet (2 mg total) by mouth every morning. 30 tablet 1  . QUEtiapine (SEROQUEL XR) 300 MG 24 hr tablet Take 2 tablets (600 mg total) by mouth at bedtime. (Patient taking differently: Take 300 mg by mouth at bedtime. ) 60 tablet 1  . Semaglutide, 1 MG/DOSE, (OZEMPIC, 1 MG/DOSE,) 4 MG/3ML SOPN Inject 1 mg into the skin once a week. 3 mL 0  . SUMAtriptan Succinate 3 MG/0.5ML SOAJ Inject 0.5 mLs into the skin See admin instructions. To use as needed for migraines     . tiZANidine (ZANAFLEX) 4 MG tablet Take 4 mg by mouth 2 (two) times daily as needed for muscle spasms.     Marland Kitchen Ubrogepant (UBRELVY) 100 MG TABS Take 100 mg by mouth daily as needed (migraine headaches.).     Marland Kitchen Urea 40 % LOTN Apply 1 application topically daily. Apply only to calluses 325 mL 0  . Vitamin D, Ergocalciferol, (DRISDOL) 1.25 MG (50000 UNIT) CAPS capsule Take 1 capsule (50,000 Units total) by mouth every 7 (seven) days. 4 capsule 0  . Pramipexole Dihydrochloride 0.75 MG TB24 TAKE 1 TABLET BY MOUTH IN THE EVENING (Patient not taking: Reported on 07/13/2020) 30 tablet 3  . propranolol (INDERAL) 20 MG tablet Take 1/2-1 tabs po TID for anxiety (Patient not taking: Reported on 07/13/2020) 90 tablet 1   No current facility-administered medications for this visit.    Medication Side Effects: tired in the afternoon.  Allergies:  Allergies  Allergen Reactions  . Toradol [Ketorolac Tromethamine]     Cannot take d/t kidney issues   . Ceftin Rash    Past  Medical History:  Diagnosis Date  . Anemia   . Anxiety   . Asthma   . Asthma 11/10/2013  . Back pain   . Bipolar 1 disorder ([place])   . Chest pain   . Chronic renal failure syndrome, stage 3 (moderate) (HCC)   . Complication of anesthesia   . Constipation   . Depression   . Depression   . Diabetic gastroparesis ([place])   . Diabetic neuropathy, type I diabetes mellitus ([place])   . Edema, lower extremity   . Gallbladder disease   . Gastroparesis   . GERD (gastroesophageal reflux disease)   . Headache(784.0)   . Hypertension   . Joint pain   . Kidney stones   . Palpitations   . Polyneuropathy in diabetes(357.2)   . PONV (postoperative nausea and vomiting)   . 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 Shortness of breath   . Tachycardia    baseline tachycardia   . 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 Hypertension Other   . Diabetes Mother        type 2  . Hypertension Mother   . Obesity Mother   . Diabetes Brother        type 1  . Renal Disease Brother        renal failure  . Hypertension Brother   . Diabetes Father   . Hypertension Father   . High Cholesterol Father   . Sleep apnea Father   . Obesity Father   . Heart disease Father   .  Breast cancer Maternal Aunt 64  . Colon cancer Maternal Aunt   . Uterine cancer Maternal Aunt 65       same as breast cancer  . Uterine cancer Maternal Aunt 61  . Rectal cancer Neg Hx   . Esophageal cancer Neg Hx     Social History   Socioeconomic History  . Marital status: Legally Separated    Spouse name: Not on file  . Number of children: 2  . Years of education: Not on file  . Highest education level: Not on file  Occupational History  . Occupation: Home maker  Tobacco Use  . Smoking status: Never Smoker  . Smokeless tobacco: Never Used  Vaping Use  . Vaping Use: Never used  Substance and Sexual Activity  . Alcohol  use: No  . Drug use: No  . Sexual activity: Yes    Partners: Male    Birth control/protection: I.U.D.  Other Topics Concern  . Not on file  Social History Narrative   Pt has a daughter and boyfriend.    Dad passed away suddenly heart attack 12/2019   Social Determinants of Health   Financial Resource Strain:   . Difficulty of Paying Living Expenses: Not on file  Food Insecurity:   . Worried About Charity fundraiser in the Last Year: Not on file  . Ran Out of Food in the Last Year: Not on file  Transportation Needs:   . Lack of Transportation (Medical): Not on file  . Lack of Transportation (Non-Medical): Not on file  Physical Activity:   . Days of Exercise per Week: Not on file  . Minutes of Exercise per Session: Not on file  Stress:   . Feeling of Stress : Not on file  Social Connections:   . Frequency of Communication with Friends and Family: Not on file  . Frequency of Social Gatherings with Friends and Family: Not on file  . Attends Religious Services: Not on file  . Active Member of Clubs or Organizations: Not on file  . Attends Archivist Meetings: Not on file  . Marital Status: Not on file  Intimate Partner Violence:   . Fear of Current or Ex-Partner: Not on file  . Emotionally Abused: Not on file  . Physically Abused: Not on file  . Sexually Abused: Not on file    Past Medical History, Surgical history, Social history, and Family history were reviewed and updated as appropriate.   Please see review of systems for further details on the patient's review from today.   Objective:   Physical Exam:  There were no vitals taken for this visit.  Physical Exam Constitutional:      Appearance: She is obese.  Neurological:     Mental Status: She is alert and oriented to person, place, and time.     Cranial Nerves: No dysarthria.  Psychiatric:        Attention and Perception: Attention and perception normal.        Mood and Affect: Mood is anxious and  depressed. Affect is not angry.        Speech: Speech normal.        Behavior: Behavior is cooperative.        Thought Content: Thought content normal. Thought content is not paranoid or delusional. Thought content does not include homicidal or suicidal ideation. Thought content does not include homicidal or suicidal plan.        Cognition and Memory: Cognition and memory  normal.        Judgment: Judgment normal.     Comments: Insight intact      Lab Review:     Component Value Date/Time   NA 137 06/30/2020 1215   K 5.3 (H) 06/30/2020 1215   CL 95 (L) 06/30/2020 1215   CO2 28 06/30/2020 1215   GLUCOSE 52 (L) 06/30/2020 1215   GLUCOSE 147 (H) 10/14/2019 0920   BUN 29 (H) 06/30/2020 1215   CREATININE 1.94 (H) 06/30/2020 1215   CREATININE 1.81 (H) 06/17/2018 1231   CALCIUM 9.9 06/30/2020 1215   PROT 7.5 06/30/2020 1215   ALBUMIN 4.5 06/30/2020 1215   AST 16 06/30/2020 1215   ALT 10 06/30/2020 1215   ALKPHOS 173 (H) 06/30/2020 1215   BILITOT <0.2 06/30/2020 1215   GFRNONAA 33 (L) 06/30/2020 1215   GFRNONAA 36 (L) 06/17/2018 1231   GFRAA 38 (L) 06/30/2020 1215   GFRAA 42 (L) 06/17/2018 1231       Component Value Date/Time   WBC 6.1 04/15/2020 1635   WBC 7.9 10/14/2019 0920   RBC 4.48 04/15/2020 1635   RBC 4.52 10/14/2019 0920   HGB 13.2 04/15/2020 1635   HCT 39.7 04/15/2020 1635   PLT 256 04/15/2020 1635   MCV 89 04/15/2020 1635   MCH 29.5 04/15/2020 1635   MCH 29.6 10/14/2019 0920   MCHC 33.2 04/15/2020 1635   MCHC 32.0 10/14/2019 0920   RDW 12.3 04/15/2020 1635   LYMPHSABS 1.7 04/15/2020 1635   MONOABS 0.8 10/14/2019 0920   EOSABS 0.6 (H) 04/15/2020 1635   BASOSABS 0.1 04/15/2020 1635    No results found for: POCLITH, LITHIUM   No results found for: PHENYTOIN, PHENOBARB, VALPROATE, CBMZ   .res Assessment: Plan:    Lakeesha was seen today for follow-up, sleeping problem, depression and anxiety.  Diagnoses and all orders for this visit:  Bipolar 1  disorder, mixed, severe  Generalized anxiety disorder  Panic disorder with agoraphobia  Insomnia due to mental condition  Restless leg syndrome, controlled  Noncompliance with medication regimen    Greater than 50% of 30 min non face to face time with patient was spent on counseling and coordination of care. We discussed Patient with a history of multiple med failures and history of psychiatric hospitalization and suicide attempts recent worsening of bipolar depression.  Chronic stressors contribute to mood and anxiety problems.   Concern about potential weight gain with medications.  She has taking the major mood stabilizers with low weight gain potential. Discussed the pros and cons of switching mood stabilizers.  Could consider ultra low-dose lithium.   She recently failed to tolerate VPA.  Is doing better on CBZ but there are drug interaction problems to concern [name] and she's aware.   Patient decompensated because of abruptly stopping all of her psych medications.  She had been doing reasonably well at the last appointment on multiple medications but has stopped all her meds and now is having both anger and depression and hopelessness.  She is seeing a therapist.  She brought up the idea of returning to Seroquel which had helped at some point in the past.  In the past stating it had become too expensive.  It is generic now.  She is aware it does have weight gain risk.  We discussed other side effects to watch for.  We do not know what the previous dose was so we were aim for a moderately high dose and try to use it as  a solo or monotherapy.  Therefore she will not resume Latuda, carbamazepine, lamotrigine, or pramipexole.  Agree with IOP  Discussed potential metabolic side effects associated with atypical antipsychotics, as well as potential risk for movement side effects. Advised pt to contact office if movement side effects occur.   Glucose better with Ozempic  RLS Not a problem  now.  quetiapine ER 600 mg HS.  Follow-up 26mos  [name], MD, DFAPA   Please see After Visit Summary for patient specific instructions.  Future Appointments  Date Time Provider [place]  07/15/2020 11:00 AM Briscoe Deutscher, DO [facility] None  07/29/2020  9:20 AM Briscoe Deutscher, DO [facility] None  08/04/2020 11:00 AM Hussami, Christina R, LCSW [facility] None  08/10/2020  1:30 PM Cottle, Billey Co., MD [facility] None    No orders of the defined types were placed in this encounter.   -------------------------------

## 2020-07-15 ENCOUNTER — Ambulatory Visit (INDEPENDENT_AMBULATORY_CARE_PROVIDER_SITE_OTHER): Payer: Medicare Other | Admitting: Family Medicine

## 2020-07-15 ENCOUNTER — Other Ambulatory Visit: Payer: Self-pay

## 2020-07-15 ENCOUNTER — Encounter (INDEPENDENT_AMBULATORY_CARE_PROVIDER_SITE_OTHER): Payer: Self-pay | Admitting: Family Medicine

## 2020-07-15 VITALS — BP 127/75 | HR 92 | Temp 98.2°F | Ht 66.0 in | Wt 201.0 lb

## 2020-07-15 DIAGNOSIS — E1069 Type 1 diabetes mellitus with other specified complication: Secondary | ICD-10-CM | POA: Diagnosis not present

## 2020-07-15 DIAGNOSIS — F3289 Other specified depressive episodes: Secondary | ICD-10-CM | POA: Diagnosis not present

## 2020-07-15 DIAGNOSIS — Z6832 Body mass index (BMI) 32.0-32.9, adult: Secondary | ICD-10-CM

## 2020-07-15 DIAGNOSIS — E669 Obesity, unspecified: Secondary | ICD-10-CM

## 2020-07-15 DIAGNOSIS — R632 Polyphagia: Secondary | ICD-10-CM

## 2020-07-19 NOTE — Progress Notes (Signed)
Chief Complaint:   OBESITY [name] is here to discuss her progress with her obesity treatment plan along with follow-up of her obesity related diagnoses. [name] is on keeping a food journal and adhering to recommended goals of 1500 calories and 95 grams of protein and states she is following her eating plan approximately 75% of the time. [name] states she is walking for 30 minutes 2-3 times per week.  Today's visit was #: 9 Starting weight: 199 lbs Starting date: 11/13/2019 Today's weight: 201 lbs Today's date: 07/15/2020 Total lbs lost to date: 0 Total lbs lost since last in-office visit: 0  Interim History: [name] says she was prescribed Reglan by Dr. Loletha Carrow, and she picked it up yesterday.  She also saw Dr. Clovis Pu in Psychiatry, and her Seroquel was changed to 600 mg at bedtime.  Assessment/Plan:   1. Type 1 diabetes mellitus with other specified complication (HCC) [name] says her blood sugar is starting to dip to the 50s fasting.  Novolog basal down to 1.15 (from 1.25) 1.25, 1.2.  She is on Ozempic 1 mg weekly.  Her high blood sugar was >400 (rebound from low) on Saturday. We had a long discussion re: healthy food choices during this time of overeating.   2. Polyphagia Hyperphagia, also called polyphagia, refers to excessive feelings of hunger, which are not relieved by eating. This is more likely to be an issues for people that have diabetes, prediabetes, or insulin resistance. She will continue to focus on protein-rich, low simple carbohydrate foods. We reviewed the importance of hydration, regular exercise for stress reduction, and restorative sleep. We discussed medications that also contribute to polyphagia, including Seroquel.   3. Other depression, with emotional eating [name] is struggling with emotional eating and using food for comfort to the extent that it is negatively impacting her health. She has been working on behavior modification techniques to help reduce her  emotional eating and has been unsuccessful. She shows no sign of suicidal or homicidal ideations.  4. Class 1 obesity with serious comorbidity and body mass index (BMI) of 32.0 to 32.9 in adult, unspecified obesity type  [name] is currently in the action stage of change. As such, her goal is to continue with weight loss efforts. She has agreed to keeping a food journal and adhering to recommended goals of 1500 calories and 95 grams of protein.   Exercise goals: For substantial health benefits, adults should do at least 150 minutes (2 hours and 30 minutes) a week of moderate-intensity, or 75 minutes (1 hour and 15 minutes) a week of vigorous-intensity aerobic physical activity, or an equivalent combination of moderate- and vigorous-intensity aerobic activity. Aerobic activity should be performed in episodes of at least 10 minutes, and preferably, it should be spread throughout the week.  Behavioral modification strategies: increasing lean protein intake, decreasing simple carbohydrates, increasing vegetables, increasing water intake, decreasing liquid calories, decreasing sodium intake, increasing high fiber foods and emotional eating strategies.  [name] has agreed to follow-up with our clinic in 2 weeks. She was informed of the importance of frequent follow-up visits to maximize her success with intensive lifestyle modifications for her multiple health conditions.   Objective:   Blood pressure 127/75, pulse 92, temperature 98.2 F (36.8 C), temperature source Oral, height 5\' 6"  (1.676 m), weight 201 lb (91.2 kg), SpO2 100 %. Body mass index is 32.44 kg/m.  General: Cooperative, alert, well developed, in no acute distress. HEENT: Conjunctivae and lids unremarkable. Cardiovascular: Regular rhythm.  Lungs: Normal  work of breathing. Neurologic: No focal deficits.   Lab Results  Component Value Date   CREATININE 1.94 (H) 06/30/2020   BUN 29 (H) 06/30/2020   NA 137 06/30/2020   K 5.3 (H)  06/30/2020   CL 95 (L) 06/30/2020   CO2 28 06/30/2020   Lab Results  Component Value Date   ALT 10 06/30/2020   AST 16 06/30/2020   ALKPHOS 173 (H) 06/30/2020   BILITOT <0.2 06/30/2020   Lab Results  Component Value Date   HGBA1C 9.5 (H) 04/15/2020   HGBA1C 8.0 (H) 09/09/2019   HGBA1C 9.0 04/05/2017   HGBA1C 11.2 (H) 11/24/2015   HGBA1C 13.3 (H) 04/22/2014   Lab Results  Component Value Date   TSH 1.530 04/15/2020   Lab Results  Component Value Date   CHOL 149 04/22/2014   HDL 71 04/22/2014   LDLCALC 68 04/22/2014   TRIG 49 04/22/2014   CHOLHDL 2.1 04/22/2014   Lab Results  Component Value Date   WBC 6.1 04/15/2020   HGB 13.2 04/15/2020   HCT 39.7 04/15/2020   MCV 89 04/15/2020   PLT 256 04/15/2020   Lab Results  Component Value Date   IRON 41 11/13/2019   TIBC 267 11/13/2019   FERRITIN 77 11/13/2019   Attestation Statements:   Reviewed by clinician on day of visit: allergies, medications, problem list, medical history, surgical history, family history, social history, and previous encounter notes.  Time spent on visit including pre-visit chart review and post-visit care and charting was 45 minutes.   I, Water quality scientist, CMA, am acting as transcriptionist for Briscoe Deutscher, DO  I have reviewed the above documentation for accuracy and completeness, and I agree with the above. Briscoe Deutscher, DO

## 2020-07-20 ENCOUNTER — Encounter (INDEPENDENT_AMBULATORY_CARE_PROVIDER_SITE_OTHER): Payer: Self-pay | Admitting: Family Medicine

## 2020-07-29 ENCOUNTER — Encounter (INDEPENDENT_AMBULATORY_CARE_PROVIDER_SITE_OTHER): Payer: Self-pay | Admitting: Family Medicine

## 2020-07-29 ENCOUNTER — Other Ambulatory Visit: Payer: Self-pay

## 2020-07-29 ENCOUNTER — Ambulatory Visit (INDEPENDENT_AMBULATORY_CARE_PROVIDER_SITE_OTHER): Payer: Medicare Other | Admitting: Family Medicine

## 2020-07-29 VITALS — BP 117/80 | HR 116 | Temp 97.8°F | Ht 66.0 in | Wt 201.0 lb

## 2020-07-29 DIAGNOSIS — Z6832 Body mass index (BMI) 32.0-32.9, adult: Secondary | ICD-10-CM

## 2020-07-29 DIAGNOSIS — R632 Polyphagia: Secondary | ICD-10-CM | POA: Diagnosis not present

## 2020-07-29 DIAGNOSIS — E1069 Type 1 diabetes mellitus with other specified complication: Secondary | ICD-10-CM | POA: Diagnosis not present

## 2020-07-29 DIAGNOSIS — N189 Chronic kidney disease, unspecified: Secondary | ICD-10-CM

## 2020-07-29 DIAGNOSIS — K5909 Other constipation: Secondary | ICD-10-CM | POA: Diagnosis not present

## 2020-07-29 DIAGNOSIS — E66811 Obesity, class 1: Secondary | ICD-10-CM

## 2020-07-29 DIAGNOSIS — E669 Obesity, unspecified: Secondary | ICD-10-CM

## 2020-07-29 DIAGNOSIS — N390 Urinary tract infection, site not specified: Secondary | ICD-10-CM

## 2020-07-29 DIAGNOSIS — F3289 Other specified depressive episodes: Secondary | ICD-10-CM

## 2020-07-29 MED ORDER — POLYETHYLENE GLYCOL 3350 17 G PO PACK: 17.0000 g | PACK | Freq: Two times a day (BID) | ORAL | 0 refills | Status: DC | PRN

## 2020-07-30 LAB — COMPREHENSIVE METABOLIC PANEL
ALT: 36 IU/L — ABNORMAL HIGH (ref 0–32)
AST: 24 IU/L (ref 0–40)
Albumin/Globulin Ratio: 1.7 (ref 1.2–2.2)
Albumin: 4.1 g/dL (ref 3.8–4.8)
Alkaline Phosphatase: 209 IU/L — ABNORMAL HIGH (ref 44–121)
BUN/Creatinine Ratio: 10 (ref 9–23)
BUN: 16 mg/dL (ref 6–20)
Bilirubin Total: 0.3 mg/dL (ref 0.0–1.2)
CO2: 25 mmol/L (ref 20–29)
Calcium: 9.5 mg/dL (ref 8.7–10.2)
Chloride: 102 mmol/L (ref 96–106)
Creatinine, Ser: 1.55 mg/dL — ABNORMAL HIGH (ref 0.57–1.00)
GFR calc Af Amer: 50 mL/min/{1.73_m2} — ABNORMAL LOW (ref >59)
GFR calc non Af Amer: 43 mL/min/{1.73_m2} — ABNORMAL LOW (ref >59)
Globulin, Total: 2.4 g/dL (ref 1.5–4.5)
Glucose: 111 mg/dL — ABNORMAL HIGH (ref 65–99)
Potassium: 5.1 mmol/L (ref 3.5–5.2)
Sodium: 140 mmol/L (ref 134–144)
Total Protein: 6.5 g/dL (ref 6.0–8.5)

## 2020-07-30 LAB — HEMOGLOBIN A1C
Est. average glucose Bld gHb Est-mCnc: 235 mg/dL
Hgb A1c MFr Bld: 9.8 % — ABNORMAL HIGH (ref 4.8–5.6)

## 2020-07-30 LAB — PHOSPHORUS: Phosphorus: 4.2 mg/dL (ref 3.0–4.3)

## 2020-07-30 LAB — MAGNESIUM: Magnesium: 1.9 mg/dL (ref 1.6–2.3)

## 2020-08-01 ENCOUNTER — Other Ambulatory Visit (INDEPENDENT_AMBULATORY_CARE_PROVIDER_SITE_OTHER): Payer: Self-pay | Admitting: Family Medicine

## 2020-08-01 DIAGNOSIS — E559 Vitamin D deficiency, unspecified: Secondary | ICD-10-CM

## 2020-08-02 ENCOUNTER — Encounter (INDEPENDENT_AMBULATORY_CARE_PROVIDER_SITE_OTHER): Payer: Self-pay

## 2020-08-02 NOTE — Telephone Encounter (Signed)
Message sent to pt.

## 2020-08-04 ENCOUNTER — Other Ambulatory Visit: Payer: Self-pay

## 2020-08-04 ENCOUNTER — Ambulatory Visit (INDEPENDENT_AMBULATORY_CARE_PROVIDER_SITE_OTHER): Payer: Medicare Other | Admitting: Licensed Clinical Social Worker

## 2020-08-04 DIAGNOSIS — F3162 Bipolar disorder, current episode mixed, moderate: Secondary | ICD-10-CM | POA: Diagnosis not present

## 2020-08-04 NOTE — Addendum Note (Signed)
Addended by: Jeanmarie Plant R on: 08/04/2020 11:49 AM   Modules accepted: Level of Service

## 2020-08-04 NOTE — Progress Notes (Signed)
Virtual Visit via Video Note  I connected with [name] on 08/04/20 at 11:00 AM EDT by a video enabled telemedicine application and verified that I am speaking with the correct person using two identifiers.  Location: Patient: home Provider: remote office [place], [place])   I discussed the limitations of evaluation and management by telemedicine and the availability of in person appointments. The patient expressed understanding and agreed to proceed.  I discussed the assessment and treatment plan with the patient. The patient was provided an opportunity to ask questions and all were answered. The patient agreed with the plan and demonstrated an understanding of the instructions.   The patient was advised to call back or seek an in-person evaluation if the symptoms worsen or if the condition fails to improve as anticipated.  I provided 30 minutes of non-face-to-face time during this encounter.   [place], LCSW    THERAPIST PROGRESS NOTE  Session Time: 11:30a-12:00p  Participation Level: Active  Behavioral Response: NeatAlertAnxious  Type of Therapy: Individual Therapy  Treatment Goals addressed: Anxiety and Coping  Interventions: Supportive  Summary: [name] is a 34 y.o. female who presents with improving symptoms related to bipolar disorder diagnosis. Pt reports good quality and quantity of sleep. Eating patterns continue to be within normal limits.  Allowed pt to explore and express thoughts and feelings associated with recent stressors: living with MIL, financial concerns, relationship with mother.   Encouraged pt to utilize/outreach to [employer] providers throughout holiday season (school social workers, Solicitor, [employer]). Encouraged pt to continue utilizing CBT-based interventions  To help manage mood and stress/anxiety.   Suicidal/Homicidal: No  Therapist Response: [name] continues to make good progress with overall self care, mood  management, and family/relational functioning. Pt reports a growing capacity for pleasure in family relationships and interactions. Treatment is showing good evolution and progress.   Plan: Return again in 3 weeks. The ongoing treatment plan includes maintaining current levels of progress and continuing to build skills to manage mood, improve stress/anxiety management, emotion regulation, distress tolerance, and behavior modification.   Diagnosis: Axis I: Bipolar, Depressed    Axis II: No diagnosis    [name] Madelein Mahadeo, LCSW 08/04/2020

## 2020-08-04 NOTE — Progress Notes (Signed)
Chief Complaint:   OBESITY [name] is here to discuss her progress with her obesity treatment plan along with follow-up of her obesity related diagnoses.   Today's visit was #: 10 Starting weight: 199 lbs Starting date: 11/13/2019 Today's weight: 201 lbs Today's date: 07/29/2020 Total lbs lost to date: +2 lbs Body mass index is 32.44 kg/m.   Interim History: [name] says she has not had a BM in 5 days.  She is continuing to see her counselor weekly.  She is not checking her blood sugars regularly due to not having a sensor.  She reports that she currently has a UTI.  Mood is more stable.  Nutrition Plan:  Keeping a food journal and adhering to recommended goals of 1500 calories and 95 grams of protein for 50-70% of the time.  Anti-obesity medications:  Ozempic 1 mg subcutaneously weekly. Reported side effects: Constipation. Hunger is moderately controlled controlled. Cravings are moderately controlled controlled.  Activity:  She has increased the amount of walking she is doing.  Assessment/Plan:   1. Other constipation Sasha has not had a bowel movement in 1 week.    Counseling Getting to Good Bowel Health: Your goal is to have one soft bowel movement each day. Drink at least 8 glasses of water each day. Eat plenty of fiber (goal is over 25 grams each day). It is best to get most of your fiber from dietary sources which includes leafy green vegetables, fresh fruit, and whole grains. You may need to add fiber with the help of OTC fiber supplements. These include Metamucil, Citrucel, and Benefiber. If you are still having trouble, try adding Miralax or Magnesium Citrate. If all of these changes do not work, contact me.  Plan:  Recommend MiraLAX and increasing water intake.  - Start polyethylene glycol (MIRALAX / GLYCOLAX) 17 g packet; Take 17 g by mouth 2 (two) times daily as needed (constipation).  Dispense: 14 each; Refill: 0  2. Type 1 diabetes mellitus with other specified  complication (HCC) [place]ile.  She says she cannot find her sensor.  Blood sugar was 67 last night (skipped dinner). We will continue to monitor symptoms as they relate to her weight loss journey. This issue directly impacts care plan for optimization of BMI and metabolic health as it impacts the patient's ability to make lifestyle changes.  - Comprehensive metabolic panel - Hemoglobin A1c - Magnesium - Phosphorus  3. Polyphagia Stable.  She will continue to focus on protein-rich, low simple carbohydrate foods. We reviewed the importance of hydration, regular exercise for stress reduction, and restorative sleep.  4. Chronic kidney disease, unspecified CKD stage Assessment: CKD stage 3 - GFR 30-59. Course: stable. Goal: Stable to improved.   Plan: Continue to monitor BMP and avoid nephrotoxic agents. Diet: Avoid buying foods that are: processed, frozen, or prepackaged to avoid excess salt.  Recheck labs today.  Lab Results  Component Value Date   CREATININE 1.55 (H) 07/29/2020   CREATININE 1.94 (H) 06/30/2020   CREATININE 1.66 (H) 04/15/2020   Lab Results  Component Value Date   CREATININE 1.55 (H) 07/29/2020   BUN 16 07/29/2020   NA 140 07/29/2020   K 5.1 07/29/2020   CL 102 07/29/2020   CO2 25 07/29/2020   - Comprehensive metabolic panel - Hemoglobin A1c - Magnesium - Phosphorus  5. Acute UTI She is currently taking Bactrim that was prescribed by her PCP.  Plan: Recheck creatinine and potassium.  6. Other depression, with emotional eating Stable.  Followed by Psychiatry. She is taking Seroquel 600 mg daily.  Behavior modification techniques were discussed today to help [name] deal with her emotional/non-hunger eating behaviors.    7. Class 1 obesity with serious comorbidity and body mass index (BMI) of 32.0 to 32.9 in adult, unspecified obesity type  [name] is currently in the action stage of change. As such, her goal is to continue with weight loss efforts.    Nutrition goals: She has agreed to keeping a food journal and adhering to recommended goals of 1500 calories and 95 grams of protein.   Exercise goals: For substantial health benefits, adults should do at least 150 minutes (2 hours and 30 minutes) a week of moderate-intensity, or 75 minutes (1 hour and 15 minutes) a week of vigorous-intensity aerobic physical activity, or an equivalent combination of moderate- and vigorous-intensity aerobic activity. Aerobic activity should be performed in episodes of at least 10 minutes, and preferably, it should be spread throughout the week.  Behavioral modification strategies: increasing lean protein intake, decreasing simple carbohydrates, increasing vegetables, increasing water intake and decreasing sodium intake.  [name] has agreed to follow-up with our clinic in 3 weeks. She was informed of the importance of frequent follow-up visits to maximize her success with intensive lifestyle modifications for her multiple health conditions.   [name] was informed we would discuss her lab results at her next visit unless there is a critical issue that needs to be addressed sooner. [name] agreed to keep her next visit at the agreed upon time to discuss these results.  Objective:   Blood pressure 117/80, pulse (!) 116, temperature 97.8 F (36.6 C), temperature source Oral, height 5\' 6"  (1.676 m), weight 201 lb (91.2 kg), SpO2 98 %. Body mass index is 32.44 kg/m.  General: Cooperative, alert, well developed, in no acute distress. HEENT: Conjunctivae and lids unremarkable. Cardiovascular: Regular rhythm.  Lungs: Normal work of breathing. Neurologic: No focal deficits.   Lab Results  Component Value Date   CREATININE 1.55 (H) 07/29/2020   BUN 16 07/29/2020   NA 140 07/29/2020   K 5.1 07/29/2020   CL 102 07/29/2020   CO2 25 07/29/2020   Lab Results  Component Value Date   ALT 36 (H) 07/29/2020   AST 24 07/29/2020   ALKPHOS 209 (H) 07/29/2020    BILITOT 0.3 07/29/2020   Lab Results  Component Value Date   HGBA1C 9.8 (H) 07/29/2020   HGBA1C 9.5 (H) 04/15/2020   HGBA1C 8.0 (H) 09/09/2019   HGBA1C 9.0 04/05/2017   HGBA1C 11.2 (H) 11/24/2015   Lab Results  Component Value Date   TSH 1.530 04/15/2020   Lab Results  Component Value Date   CHOL 149 04/22/2014   HDL 71 04/22/2014   LDLCALC 68 04/22/2014   TRIG 49 04/22/2014   CHOLHDL 2.1 04/22/2014   Lab Results  Component Value Date   WBC 6.1 04/15/2020   HGB 13.2 04/15/2020   HCT 39.7 04/15/2020   MCV 89 04/15/2020   PLT 256 04/15/2020   Lab Results  Component Value Date   IRON 41 11/13/2019   TIBC 267 11/13/2019   FERRITIN 77 11/13/2019   Attestation Statements:   Reviewed by clinician on day of visit: allergies, medications, problem list, medical history, surgical history, family history, social history, and previous encounter notes.  Time spent on visit including pre-visit chart review and post-visit care and charting was 45 minutes.   I, Water quality scientist, CMA, am acting as Location manager for [employer], DO  I have reviewed the above documentation for accuracy and completeness, and I agree with the above. Briscoe Deutscher, DO

## 2020-08-10 ENCOUNTER — Other Ambulatory Visit (INDEPENDENT_AMBULATORY_CARE_PROVIDER_SITE_OTHER): Payer: Self-pay | Admitting: Family Medicine

## 2020-08-10 ENCOUNTER — Telehealth (INDEPENDENT_AMBULATORY_CARE_PROVIDER_SITE_OTHER): Payer: Medicare Other | Admitting: Psychiatry

## 2020-08-10 ENCOUNTER — Encounter: Payer: Self-pay | Admitting: Psychiatry

## 2020-08-10 DIAGNOSIS — F411 Generalized anxiety disorder: Secondary | ICD-10-CM

## 2020-08-10 DIAGNOSIS — E1069 Type 1 diabetes mellitus with other specified complication: Secondary | ICD-10-CM

## 2020-08-10 DIAGNOSIS — F4001 Agoraphobia with panic disorder: Secondary | ICD-10-CM

## 2020-08-10 DIAGNOSIS — F5105 Insomnia due to other mental disorder: Secondary | ICD-10-CM | POA: Diagnosis not present

## 2020-08-10 DIAGNOSIS — F3162 Bipolar disorder, current episode mixed, moderate: Secondary | ICD-10-CM | POA: Diagnosis not present

## 2020-08-10 DIAGNOSIS — G2581 Restless legs syndrome: Secondary | ICD-10-CM

## 2020-08-10 MED ORDER — QUETIAPINE FUMARATE ER 300 MG PO TB24: 600.0000 mg | ORAL_TABLET | Freq: Every day | ORAL | 2 refills | Status: DC

## 2020-08-10 MED ORDER — GABAPENTIN 100 MG PO CAPS: ORAL_CAPSULE | ORAL | 3 refills | Status: DC

## 2020-08-10 NOTE — Progress Notes (Signed)
[name] [id number] 05/18/1986 34 y.o.   Virtual Visit via [place]  I connected with pt by WebEx and verified that I am speaking with the correct person using two identifiers.   I discussed the limitations, risks, security and privacy concerns of performing an evaluation and management service by [name] and the availability of in person appointments. I also discussed with the patient that there may be a patient responsible charge related to this service. The patient expressed understanding and agreed to proceed.  I discussed the assessment and treatment plan with the patient. The patient was provided an opportunity to ask questions and all were answered. The patient agreed with the plan and demonstrated an understanding of the instructions.   The patient was advised to call back or seek an in-person evaluation if the symptoms worsen or if the condition fails to improve as anticipated.  I provided 30 minutes of video time during this encounter. The call started at 130 and ended at 2:00. The patient was located at home and the provider was located office.   Subjective:   Patient ID:  [name] is a 34 y.o. (DOB 01-Aug-1986) female.  Chief Complaint:  Chief Complaint  Patient presents with  . Follow-up  . Anxiety  . Depression    Depression        [name] presents to the office today for follow-up of bipolar disorder and generalized anxiety disorder.  When seen June 05, 2019.  The following changes were made: Change pramipexole ER and increase to 0.75 mg pm to rid nausea and improve mood in the afternoon.  For tiredness in the afternoon switch Equetro to 400 mg AM , 1@ 6 and 1 at 11. For panic in daytime start gabapentin 300 mg each am as preventative.  She's only taking it prn now. We continued unchanged Latuda 120 mg and lamotrigine 300 mg twice daily for bipolar disorder. She just got the ER pramipexole about a week ago.  Can't tell difference with nausea DT   gastroparesis worse lately with constipation Less afternoon tiredness with current split Equetro dose.    Last seen July 22, 2019.  Meds were not changed at that visit.  She called back to October 20 wanting to try valproate because of racing thoughts and difficulty sleeping.  Stating the trazodone was not working.  She was told it was okay to start Depakote ER 500 mg nightly and then gradually increased to 1500 mg nightly.  She called back again on October 6 and this physician noted the following: RTC Pt called to report new med she started having a lot of side effects mood swings, sounds in ears, suicidal thoughts, tremors. Causing issues with husband She is currently taking Depakote ER 1500 mg nightly. She commits to safety. The only symptom directly attributable to Depakote would be the tremor.  However its presence would indicate she is not going to be able to tolerate a higher dose of Depakote to control her mood symptoms.  Therefore we will wean the Depakote. In order to control the mood symptoms we will increase the Equetro at the evening dose which should not cause significant side effects problems during the day but help with mood stability.  This increase should also make it easier to wean the Depakote quickly.  She agrees to this plan.  She will call back if the suicidal thoughts become too intense to tolerate. Reduce Depakote ER 500 mg tablets to 1 a night for 4 nights then stop Increase  Equetro 200 mg capsules to 2 in the morning and 3 nightly. Cristina Parents, MD, DFAPA  She's been back on Equetro and doing OK. 2 weeks leading to Xmas with a lot of anger and lashing out a good bit.  Had run out of mirapex and Latuda for a couple of days in that time frame.  Crying is less now.  No SE CBZ now.  A lot of migraines off her Trokendi bc risk kidney stones.  Plans Botox for HA.  Usually sleeping OK without trazodone.   Still taking Latuda, buspirone, gabapentin, lamotrigine 300 mg  BID.    seen 12.30.21 without med change.    12/17/19 appt with the following noted: Since then increased gabapentin for anxiety to 600 BID-TID which helped but caused some tiredness.  Needing trazodone more bc EMA/EFA.  She's stopped caffeine.  Mood stability is getting better despite a lot of stress. $ stress and racing thoughts at night and scattered some with stressors.  Depression is mild lately.  Took trip with H last week helped.  Coming up on 2 year anniversary of B's death.  B diabetic died last year with DM and kidney failure. Not as much anger irritability.  Some mood swings early FEB but better now. Custody battle with exH.  He's nicer to her now.   Weight going up. Hates weight gain effects of meds.    Consistent with meds.  No sig SI other than fleeting rarely.  Scattered concentration and racing thoughts randomly.   Sleep OK. Plan: No med changes/ FU 4 mos  06/18/20 TC saying she stopped all psych meds end of August.  06/22/20 appt with the following noted: Had gradually dropped off meds and not sure why.  Stoped Latuda, buspirone, CBZ. Pramipexole., and lamotrigine.  Sleep Ok without trazodone.  Gabapentin less tolerated as prn. Stopped Equetro over a month ago DT insurance.  Back on regular medicare and it might be covered. A lot of stress here lately. Lost father 01/01/20 of MI.  since here and called got propranolol and it hleped crying.  Also got some clonazepam.   Had given up on life and everything.  Not SI today but has been.  No plan.  Depression and mood swings including anger.  Anxiety. She and therapist say she's depressed and recommended IOP at [facility]. Agrees to retry Seroquel which helped in the past Plan: Acute decompensation because of psychiatric noncompliance. quetiapine ER 1 at night for 4 nights, then 2 at night for 4 nights, then 3 at night.  07/13/2020 appointment with the following noted: Patient called in between appointments complaining of quetiapine call  causing restless legs.  Gabapentin was called in with instructions to make this side effect resolve. Misunderstood and only taking 300 mg Seroquel XR for a couple of days. Referred to [facility] and has continued therapy. Feels better on Seroquel.  No longer SI.  More stable.  Less anger.  Depression down to mild generally.  Fewer panic attacks. Sleeps with Seroquel 8 hours.  RLS is managed fairly well at this time. Initially too tired with Seroquel and better now. Clonazepam once in 2 weeks for panic driving. Referred to IOP and continued Seroquel ER 600 HS  08/10/20 appt with the following noted: Insurance wouldn't cover enough of IOP.  Still seeing therapist every 2 weeks. Consistent with Seroquel XR 600 daily and needs gabapentin with it DT RLS. No longer taking propranolol. Taking clonazepam only twice in last month. I feel pretty stable  right now.  First Xmas without father.  Feels better than mos ago.  More stable.  Sleep good.  Hangover if late with Seroquel.  Not unusually angry, under control.  Depression is better.  Occ down day.  Function is pretty good with routine things.   Energy is not great esp in the AM Gabapentin will make her feel too drunk if dose is too high. SE Seroquel makes her hungry.  Remarried April 2020.  34 yo D ADD is stressful and disrespectful. Disability review approved mostly for psych reasons.  Going to [facility] weight management and just started vitamin D. Level 24.5.  Past Psychiatric Medication Trials: lamotrigine 300 twice daily, Equetro 900 daily,  Depakote 1500 SE, Equetro  brief Vraylar,  lithium felt slowed down,  Seroquel worked but stopped DT insurance,  Latuda 120, Abilify 15,  sertraline, duloxetine,  gabapentin 600 mg 3 times daily tiredness,  buspirone 30 twice daily,,  clonazepam, trazodone, Xanax , hydroxyzine NR,  History of suicidal ideation and about 5 suicide attempts with about 4 psychiatric hospitalizations  Review of  Systems:  Review of Systems  Cardiovascular: Negative for palpitations.  Gastrointestinal: Negative for nausea and vomiting.  Musculoskeletal: Positive for arthralgias.  Neurological: Negative for tremors and weakness.  Psychiatric/Behavioral: Positive for depression and sleep disturbance.    Medications: I have reviewed the patient's current medications.  Current Outpatient Medications  Medication Sig Dispense Refill  . albuterol (PROVENTIL) (2.5 MG/3ML) 0.083% nebulizer solution Take 3 mLs (2.5 mg total) by nebulization every 6 (six) hours as needed for wheezing or shortness of breath. 150 mL 1  . albuterol (VENTOLIN HFA) 108 (90 Base) MCG/ACT inhaler Inhale 2 puffs into the lungs every 6 (six) hours as needed for wheezing or shortness of breath. 16 g 2  . Botulinum Toxin Type A (BOTOX) 200 units SOLR Inject 155 Units as directed every 3 (three) months.     . clonazePAM (KLONOPIN) 0.5 MG tablet Take 1 tablet (0.5 mg total) by mouth every 8 (eight) hours as needed. for anxiety 20 tablet 0  . enalapril (VASOTEC) 20 MG tablet Take 20 mg by mouth daily.     . fluticasone (FLONASE) 50 MCG/ACT nasal spray Place 2 sprays into both nostrils daily. (Patient taking differently: Place 2 sprays into both nostrils daily as needed (seasonal allergies.). ) 16 g 2  . furosemide (LASIX) 40 MG tablet Take 40 mg by mouth every other day.     . gabapentin (NEURONTIN) 100 MG capsule 2 capsules at 6 PM and 2 capsules at 9 PM 120 capsule 3  . GLUCAGON EMERGENCY 1 MG injection Inject 1 mg into the skin as needed (hypoglycemia.).     . Insulin Human (INSULIN PUMP) SOLN Inject 1 each into the skin 3 times daily with meals, bedtime and 2 AM. Novolog: 36-38 units per day    . Insulin Pen Needle 32G X 4 MM MISC 1 each by Does not apply route once a week. 50 each 0  . levonorgestrel (MIRENA, 52 MG,) 20 MCG/24HR IUD 1 each by Intrauterine route once.    . metoCLOPramide (REGLAN) 5 MG tablet Take 1 tablet (5 mg total) by  mouth 3 (three) times daily before meals. 90 tablet 0  . mometasone-formoterol (DULERA) 100-5 MCG/ACT AERO Inhale 2 puffs into the lungs 2 (two) times daily. 13 g 11  . montelukast (SINGULAIR) 10 MG tablet Take 1 tablet (10 mg total) by mouth at bedtime. 90 tablet 3  . NOVOLOG 100 UNIT/ML  injection Inject 36-38 Units into the skin daily. Via insulin pump per sliding scale    . polyethylene glycol (MIRALAX / GLYCOLAX) 17 g packet Take 17 g by mouth 2 (two) times daily as needed (constipation). 14 each 0  . promethazine (PHENERGAN) 25 MG tablet Take 1 tablet (25 mg total) by mouth every 8 (eight) hours as needed for nausea or vomiting. 30 tablet 0  . Prucalopride Succinate (MOTEGRITY) 2 MG TABS Take 1 tablet (2 mg total) by mouth every morning. 30 tablet 1  . QUEtiapine (SEROQUEL XR) 300 MG 24 hr tablet Take 2 tablets (600 mg total) by mouth at bedtime. 60 tablet 2  . Semaglutide, 1 MG/DOSE, (OZEMPIC, 1 MG/DOSE,) 4 MG/3ML SOPN Inject 1 mg into the skin once a week. 3 mL 0  . SUMAtriptan Succinate 3 MG/0.5ML SOAJ Inject 0.5 mLs into the skin See admin instructions. To use as needed for migraines     . tiZANidine (ZANAFLEX) 4 MG tablet Take 4 mg by mouth 2 (two) times daily as needed for muscle spasms.     Marland Kitchen Ubrogepant (UBRELVY) 100 MG TABS Take 100 mg by mouth daily as needed (migraine headaches.).     Marland Kitchen Vitamin D, Ergocalciferol, (DRISDOL) 1.25 MG (50000 UNIT) CAPS capsule Take 1 capsule (50,000 Units total) by mouth every 7 (seven) days. 4 capsule 0   No current facility-administered medications for this visit.    Medication Side Effects: tired in the afternoon.  Allergies:  Allergies  Allergen Reactions  . Toradol [Ketorolac Tromethamine]     Cannot take d/t kidney issues   . Ceftin Rash    Past Medical History:  Diagnosis Date  . Anemia   . Anxiety   . Asthma   . Asthma 11/10/2013  . Back pain   . Bipolar 1 disorder ([place])   . Chest pain   . Chronic renal failure syndrome, stage 3  (moderate) (HCC)   . Complication of anesthesia   . Constipation   . Depression   . Depression   . Diabetic gastroparesis ([place])   . Diabetic neuropathy, type I diabetes mellitus ([place])   . Edema, lower extremity   . Gallbladder disease   . Gastroparesis   . GERD (gastroesophageal reflux disease)   . Headache(784.0)   . Hypertension   . Joint pain   . Kidney stones   . Palpitations   . Polyneuropathy in diabetes(357.2)   . PONV (postoperative nausea and vomiting)   . 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 Shortness of breath   . Tachycardia    baseline tachycardia   . 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 Hypertension Other   . Diabetes Mother        type 2  . Hypertension Mother   . Obesity Mother   . Diabetes Brother        type 1  . Renal Disease Brother        renal failure  . Hypertension Brother   . Diabetes Father   . Hypertension Father   . High Cholesterol Father   . Sleep apnea Father   . Obesity Father   . Heart disease Father   . Breast cancer Maternal Aunt 64  . Colon cancer Maternal Aunt   . Uterine cancer Maternal Aunt 65       same as breast cancer  . Uterine cancer  Maternal Aunt 61  . Rectal cancer Neg Hx   . Esophageal cancer Neg Hx     Social History   Socioeconomic History  . Marital status: Legally Separated    Spouse name: Not on file  . Number of children: 2  . Years of education: Not on file  . Highest education level: Not on file  Occupational History  . Occupation: Home maker  Tobacco Use  . Smoking status: Never Smoker  . Smokeless tobacco: Never Used  Vaping Use  . Vaping Use: Never used  Substance and Sexual Activity  . Alcohol use: No  . Drug use: No  . Sexual activity: Yes    Partners: Male    Birth control/protection: I.U.D.  Other Topics Concern  . Not on file  Social History Narrative   Pt has a daughter and  boyfriend.    Dad passed away suddenly heart attack 12/2019   Social Determinants of Health   Financial Resource Strain:   . Difficulty of Paying Living Expenses: Not on file  Food Insecurity:   . Worried About Charity fundraiser in the Last Year: Not on file  . Ran Out of Food in the Last Year: Not on file  Transportation Needs:   . Lack of Transportation (Medical): Not on file  . Lack of Transportation (Non-Medical): Not on file  Physical Activity:   . Days of Exercise per Week: Not on file  . Minutes of Exercise per Session: Not on file  Stress:   . Feeling of Stress : Not on file  Social Connections:   . Frequency of Communication with Friends and Family: Not on file  . Frequency of Social Gatherings with Friends and Family: Not on file  . Attends Religious Services: Not on file  . Active Member of Clubs or Organizations: Not on file  . Attends Archivist Meetings: Not on file  . Marital Status: Not on file  Intimate Partner Violence:   . Fear of Current or Ex-Partner: Not on file  . Emotionally Abused: Not on file  . Physically Abused: Not on file  . Sexually Abused: Not on file    Past Medical History, Surgical history, Social history, and Family history were reviewed and updated as appropriate.   Please see review of systems for further details on the patient's review from today.   Objective:   Physical Exam:  There were no vitals taken for this visit.  Physical Exam Constitutional:      Appearance: She is obese.  Neurological:     Mental Status: She is alert and oriented to person, place, and time.     Cranial Nerves: No dysarthria.  Psychiatric:        Attention and Perception: Attention and perception normal.        Mood and Affect: Mood is anxious and depressed. Affect is not angry.        Speech: Speech normal.        Behavior: Behavior is cooperative.        Thought Content: Thought content normal. Thought content is not paranoid or  delusional. Thought content does not include homicidal or suicidal ideation. Thought content does not include homicidal or suicidal plan.        Cognition and Memory: Cognition and memory normal.        Judgment: Judgment normal.     Comments: Insight intact      Lab Review:     Component Value Date/Time  NA 140 07/29/2020 1246   K 5.1 07/29/2020 1246   CL 102 07/29/2020 1246   CO2 25 07/29/2020 1246   GLUCOSE 111 (H) 07/29/2020 1246   GLUCOSE 147 (H) 10/14/2019 0920   BUN 16 07/29/2020 1246   CREATININE 1.55 (H) 07/29/2020 1246   CREATININE 1.81 (H) 06/17/2018 1231   CALCIUM 9.5 07/29/2020 1246   PROT 6.5 07/29/2020 1246   ALBUMIN 4.1 07/29/2020 1246   AST 24 07/29/2020 1246   ALT 36 (H) 07/29/2020 1246   ALKPHOS 209 (H) 07/29/2020 1246   BILITOT 0.3 07/29/2020 1246   GFRNONAA 43 (L) 07/29/2020 1246   GFRNONAA 36 (L) 06/17/2018 1231   GFRAA 50 (L) 07/29/2020 1246   GFRAA 42 (L) 06/17/2018 1231       Component Value Date/Time   WBC 6.1 04/15/2020 1635   WBC 7.9 10/14/2019 0920   RBC 4.48 04/15/2020 1635   RBC 4.52 10/14/2019 0920   HGB 13.2 04/15/2020 1635   HCT 39.7 04/15/2020 1635   PLT 256 04/15/2020 1635   MCV 89 04/15/2020 1635   MCH 29.5 04/15/2020 1635   MCH 29.6 10/14/2019 0920   MCHC 33.2 04/15/2020 1635   MCHC 32.0 10/14/2019 0920   RDW 12.3 04/15/2020 1635   LYMPHSABS 1.7 04/15/2020 1635   MONOABS 0.8 10/14/2019 0920   EOSABS 0.6 (H) 04/15/2020 1635   BASOSABS 0.1 04/15/2020 1635    No results found for: POCLITH, LITHIUM   No results found for: PHENYTOIN, PHENOBARB, VALPROATE, CBMZ   .res Assessment: Plan:    Dallana was seen today for follow-up, anxiety and depression.  Diagnoses and all orders for this visit:  Bipolar 1 disorder, mixed, severe -     QUEtiapine (SEROQUEL XR) 300 MG 24 hr tablet; Take 2 tablets (600 mg total) by mouth at bedtime.  Generalized anxiety disorder  Panic disorder with agoraphobia  Insomnia due to mental  condition  Restless leg syndrome, controlled -     gabapentin (NEURONTIN) 100 MG capsule; 2 capsules at 6 PM and 2 capsules at 9 PM    Greater than 50% of 30 min non face to face time with patient was spent on counseling and coordination of care. We discussed Patient with a history of multiple med failures and history of psychiatric hospitalization and suicide attempts recent worsening of bipolar depression.  Chronic stressors contribute to mood and anxiety problems.   Concern about potential weight gain with medications.  She has taking the major mood stabilizers with low weight gain potential. Discussed the pros and cons of switching mood stabilizers.  Could consider ultra low-dose lithium.   She recently failed to tolerate VPA.    Patient decompensated because of abruptly stopping all of her psych medications.  She had been doing reasonably well at the last appointment on multiple medications until stopped all her meds .  She is seeing a therapist.   She has regained good stability on Seroquel Exar 600 mg every afternoon with side effects of restlessness and hunger but she is working on those side effects with the use of gabapentin and Ozempic.  Therefore she will not resume Latuda, carbamazepine, lamotrigine, or pramipexole.  Consider retrying Vraylar bc weight gain.  But this is a very different med Medication from Seroquel and she is got good stability at the present on Seroquel with a recent history of severe instability.  Would recommend not changing this medication at this time if possible.  The longer she is stable on Seroquel the  less risk there is with the potential change.  Discussed potential metabolic side effects associated with atypical antipsychotics, as well as potential risk for movement side effects. Advised pt to contact office if movement side effects occur.   Glucose better with Ozempic.  Recommend she discuss the Ozempic dosage which is currently 1 mg daily to see if the dose  could be increased closer to the 2.4 mg used in the weight loss study.  If she can achieve sufficient weight loss without changing the Seroquel that would be ideal from a psychiatric standpoint.  RLS Not a problem now.  quetiapine ER 600 mg HS.  No med changes  Follow-up 2 to 3 months  Cristina Parents, MD, DFAPA   Please see After Visit Summary for patient specific instructions.  Future Appointments  Date Time Provider [place]  08/12/2020  9:20 AM Briscoe Deutscher, DO [facility] None  08/25/2020 11:00 AM Hussami, Christina R, LCSW [facility] None  08/26/2020  9:20 AM Briscoe Deutscher, DO [facility] None  09/09/2020 10:30 AM Cottle, Billey Co., MD [facility] None    No orders of the defined types were placed in this encounter.   -------------------------------

## 2020-08-10 NOTE — Telephone Encounter (Signed)
Dr Wallace pt °

## 2020-08-12 ENCOUNTER — Other Ambulatory Visit: Payer: Self-pay

## 2020-08-12 ENCOUNTER — Ambulatory Visit (INDEPENDENT_AMBULATORY_CARE_PROVIDER_SITE_OTHER): Payer: Medicare Other | Admitting: Family Medicine

## 2020-08-12 ENCOUNTER — Other Ambulatory Visit (INDEPENDENT_AMBULATORY_CARE_PROVIDER_SITE_OTHER): Payer: Self-pay | Admitting: Family Medicine

## 2020-08-12 ENCOUNTER — Encounter (INDEPENDENT_AMBULATORY_CARE_PROVIDER_SITE_OTHER): Payer: Self-pay | Admitting: Family Medicine

## 2020-08-12 VITALS — BP 109/75 | HR 127 | Temp 97.9°F | Ht 66.0 in | Wt 207.0 lb

## 2020-08-12 DIAGNOSIS — Z6833 Body mass index (BMI) 33.0-33.9, adult: Secondary | ICD-10-CM | POA: Diagnosis not present

## 2020-08-12 DIAGNOSIS — E1069 Type 1 diabetes mellitus with other specified complication: Secondary | ICD-10-CM

## 2020-08-12 DIAGNOSIS — E669 Obesity, unspecified: Secondary | ICD-10-CM | POA: Diagnosis not present

## 2020-08-12 DIAGNOSIS — E1143 Type 2 diabetes mellitus with diabetic autonomic (poly)neuropathy: Secondary | ICD-10-CM

## 2020-08-12 DIAGNOSIS — F3163 Bipolar disorder, current episode mixed, severe, without psychotic features: Secondary | ICD-10-CM | POA: Diagnosis not present

## 2020-08-12 DIAGNOSIS — K3184 Gastroparesis: Secondary | ICD-10-CM

## 2020-08-12 DIAGNOSIS — Z599 Problem related to housing and economic circumstances, unspecified: Secondary | ICD-10-CM | POA: Diagnosis not present

## 2020-08-12 MED ORDER — OZEMPIC (1 MG/DOSE) 4 MG/3ML ~~LOC~~ SOPN: 1.0000 mg | PEN_INJECTOR | SUBCUTANEOUS | 0 refills | Status: DC

## 2020-08-12 NOTE — Telephone Encounter (Signed)
Last seen by Dr. Wallace. 

## 2020-08-16 NOTE — Progress Notes (Signed)
Chief Complaint:   OBESITY [name] is here to discuss her progress with her obesity treatment plan along with follow-up of her obesity related diagnoses.   Today's visit was #: 11 Starting weight: 199 lbs Starting date: 11/13/2019 Today's weight: 207 lbs Today's date: 08/12/2020 Total lbs lost to date: +8 lbs Body mass index is 33.41 kg/m.   Interim History: [name] says she finally found her sensor, and it is charging.  She reports that her blood sugars have been all over the place".  She feels "terrible" with no energy.    She has been taking gabapentin 200 mg at night only for RLS.  She has been going to therapy, but it is generalized and not food-focused.  Food Recall:  "No will power".  Nutrition Plan: keeping a food journal and adhering to recommended goals of 1500 calories and 95 grams of protein for 50% of the time. Anti-obesity medications: Ozempic. Reported side effects: None. Hunger is moderately controlled controlled. Cravings are poorly controlled controlled.  Activity: Walking for 45-60 minutes 3-4 times per week.  Assessment/Plan:   1. Type 1 diabetes mellitus with other specified complication (HCC) Diabetes Mellitus: needs improvement. Medication: Ozempic. Issues reviewed with her: blood sugar goals, complications of diabetes mellitus, hypoglycemia prevention and treatment, exercise, and nutrition.  Lab Results  Component Value Date   HGBA1C 9.8 (H) 07/29/2020   HGBA1C 9.5 (H) 04/15/2020   HGBA1C 8.0 (H) 09/09/2019   Lab Results  Component Value Date   LDLCALC 68 04/22/2014   CREATININE 1.55 (H) 07/29/2020   - Refill Semaglutide, 1 MG/DOSE, (OZEMPIC, 1 MG/DOSE,) 4 MG/3ML SOPN; Inject 1 mg into the skin once a week.  Dispense: 3 mL; Refill: 0  2. Gastroparesis due to DM (HCC) [name] is taking Reglan 5 mg three times daily before meals.  She is followed by GI. This issue directly impacts care plan for optimization of BMI and metabolic health as it  impacts the patient's ability to make lifestyle changes. We will continue to monitor symptoms as they relate to her weight loss journey.  3. Financial difficulties Will place referral to [facility].   - AMB Referral to [facility]  4. Bipolar 1 disorder, mixed, severe ([place]) She says this has been more stable with Seroquel.  She has had some side effect of RLS, and is taking gabapentin to help.  She also endorses some polyphagia.  5. Class 1 obesity with serious comorbidity and body mass index (BMI) of 33.0 to 33.9 in adult, unspecified obesity type  Course: [name] is currently in the action stage of change. As such, her goal is to continue with weight loss efforts.   Nutrition goals: She has agreed to keeping a food journal and adhering to recommended goals of 1500 calories and 95 grams of protein.   Exercise goals: For substantial health benefits, adults should do at least 150 minutes (2 hours and 30 minutes) a week of moderate-intensity, or 75 minutes (1 hour and 15 minutes) a week of vigorous-intensity aerobic physical activity, or an equivalent combination of moderate- and vigorous-intensity aerobic activity. Aerobic activity should be performed in episodes of at least 10 minutes, and preferably, it should be spread throughout the week.  Behavioral modification strategies: increasing lean protein intake, decreasing simple carbohydrates, increasing vegetables, increasing water intake and emotional eating strategies.  [name] has agreed to follow-up with our clinic in 2-3 weeks. She was informed of the importance of frequent follow-up visits to maximize her success with intensive  lifestyle modifications for her multiple health conditions.   Objective:   Blood pressure 109/75, pulse (!) 127, temperature 97.9 F (36.6 C), temperature source Oral, height 5\' 6"  (1.676 m), weight 207 lb (93.9 kg), SpO2 100 %. Body mass index is 33.41 kg/m.  General: Cooperative, alert, well developed, in  no acute distress. HEENT: Conjunctivae and lids unremarkable. Cardiovascular: Regular rhythm.  Lungs: Normal work of breathing. Neurologic: No focal deficits.   Lab Results  Component Value Date   CREATININE 1.55 (H) 07/29/2020   BUN 16 07/29/2020   NA 140 07/29/2020   K 5.1 07/29/2020   CL 102 07/29/2020   CO2 25 07/29/2020   Lab Results  Component Value Date   ALT 36 (H) 07/29/2020   AST 24 07/29/2020   ALKPHOS 209 (H) 07/29/2020   BILITOT 0.3 07/29/2020   Lab Results  Component Value Date   HGBA1C 9.8 (H) 07/29/2020   HGBA1C 9.5 (H) 04/15/2020   HGBA1C 8.0 (H) 09/09/2019   HGBA1C 9.0 04/05/2017   HGBA1C 11.2 (H) 11/24/2015   No results found for: INSULIN Lab Results  Component Value Date   TSH 1.530 04/15/2020   Lab Results  Component Value Date   CHOL 149 04/22/2014   HDL 71 04/22/2014   LDLCALC 68 04/22/2014   TRIG 49 04/22/2014   CHOLHDL 2.1 04/22/2014   Lab Results  Component Value Date   WBC 6.1 04/15/2020   HGB 13.2 04/15/2020   HCT 39.7 04/15/2020   MCV 89 04/15/2020   PLT 256 04/15/2020   Lab Results  Component Value Date   IRON 41 11/13/2019   TIBC 267 11/13/2019   FERRITIN 77 11/13/2019   Obesity Behavioral Intervention:   Approximately 15 minutes were spent on the discussion below.  ASK: We discussed the diagnosis of obesity with [name] today and [name] agreed to give [name] permission to discuss obesity behavioral modification therapy today.  ASSESS: [name] has the diagnosis of obesity and her BMI today is 33.5. [name] is in the action stage of change.   ADVISE: [name] was educated on the multiple health risks of obesity as well as the benefit of weight loss to improve her health. She was advised of the need for long term treatment and the importance of lifestyle modifications to improve her current health and to decrease her risk of future health problems.  AGREE: Multiple dietary modification options and treatment options were  discussed and [name] agreed to follow the recommendations documented in the above note.  ARRANGE: [name] was educated on the importance of frequent visits to treat obesity as outlined per CMS and USPSTF guidelines and agreed to schedule her next follow up appointment today.  Attestation Statements:   Reviewed by clinician on day of visit: allergies, medications, problem list, medical history, surgical history, family history, social history, and previous encounter notes.  I, Water quality scientist, CMA, am acting as transcriptionist for Briscoe Deutscher, DO  I have reviewed the above documentation for accuracy and completeness, and I agree with the above. Briscoe Deutscher, DO

## 2020-08-19 ENCOUNTER — Other Ambulatory Visit: Payer: Self-pay | Admitting: *Deleted

## 2020-08-19 ENCOUNTER — Encounter: Payer: Self-pay | Admitting: *Deleted

## 2020-08-19 DIAGNOSIS — H532 Diplopia: Secondary | ICD-10-CM | POA: Diagnosis not present

## 2020-08-19 DIAGNOSIS — H2701 Aphakia, right eye: Secondary | ICD-10-CM | POA: Diagnosis not present

## 2020-08-19 NOTE — Patient Outreach (Signed)
[facility]) Care Management  08/19/2020  [name] 1985/12/21 [id number]   CSW made an initial attempt to try and contact patient today to perform the phone assessment, as well as assess and assist with social work needs and services, without success.  A HIPAA compliant message was left for patient on voicemail.  CSW is currently awaiting a return call.  CSW will make a second outreach attempt within the next 3-4 business days, if a return call is not received from patient in the meantime.  CSW will also mail a Patient Unsuccessful Outreach Letter to patient's home, requesting that she contact CSW if she is interested in receiving social work services through [place] with [facility].  [name], BSW, MSW, LCSW  Licensed Education officer, environmental [facility]  Mailing [name], [place], [place] [address] Physical Address-[address] [place], [place] [address] Toll Free Main # [phone] Fax # [phone] Cell # [phone]  [name]

## 2020-08-22 ENCOUNTER — Other Ambulatory Visit: Payer: Self-pay | Admitting: Gastroenterology

## 2020-08-23 ENCOUNTER — Ambulatory Visit: Payer: Self-pay | Admitting: *Deleted

## 2020-08-24 ENCOUNTER — Other Ambulatory Visit: Payer: Self-pay | Admitting: *Deleted

## 2020-08-24 ENCOUNTER — Encounter: Payer: Self-pay | Admitting: *Deleted

## 2020-08-24 NOTE — Patient Outreach (Signed)
[facility]) Care Management  08/24/2020  [name] [birth year] [id number]  CSW was able to make initial contact with patient today to perform phone assessment, as well as assess and assist with social work needs and services.  CSW introduced self, explained role and types of services provided through [facility] ([facility]).  CSW further explained to patient that CSW works with patient's Primary Care Physician, Dr. Jenna Luo, also with [facility].  CSW then explained the reason for the call, indicating that Dr. Dennard Schaumann thought that patient would benefit from social work services and resources to assist with making referrals to various community agencies to try and obtain financial assistance to buy Christmas gifts for the children, as well as nutrition assistance for food insecurities.  CSW obtained two HIPAA compliant identifiers from patient, which included patient's name and date of birth.  Patient reported that she is currently living in a camper with her significant other, on his mother's property, along with his three children and her two children.  Patient went on to explain that the only source of income they have coming in is her Social Security Disability check.  Patient also receives Traditional Medicare and Adult Medicaid.  Patient informed CSW that her significant other received his last Unemployment Check in September, so they are really struggling to pay their monthly expenses, in addition to buying food for a household of 7.  Patient is also concerned about not being able to provide Christmas presents for their 5 children, requesting available resources that may be able to offer financial assistance.  CSW obtained verbal consent from patient to place referrals for financial assistance, as well as nutrition assistance, through the [employer].  CSW also agreed to e-mail patient a list of financial resources  and nutrition resources, per her request.  Patient stated, "And to make matters worse, the father of my youngest child is now refusing to pay child support, even though he has a great job working full-time at [employer], making more than $20 per hour".  Patient admitted that she does not have the money to retain an attorney to take him back to court to fight for payments.  Patient indicated that she is having to borrow money from family members to feed the children, but that those resources have also been depilated.  CSW provided patient with the following list of agencies that may be able to offer financial assistance for Christmas presents: [employer], [employer], [place], [employer], [employer] [facility] [employer] and [place].  CSW also provided patient with the contact number for each agency.  CSW inquired as to whether or not patient would be interested in applying for [employer] (Supplemental Nutrition Assistance Program), for herself and the 5 children, for which patient agreed.  Patient is aware that CSW will be e-mailing her applications to apply for SNAP.  CSW explained to patient that CSW will be out-of-the-office for the Thanksgiving holiday, but that CSW's colleague will be covering, in the event that patient has questions or needs additional assistance.  Otherwise, CSW agreed to follow-up with patient again on Tuesday, September 14, 2020, around 10:00 AM.  CSW was able to confirm that patient has the correct contact information for CSW, as well as to the main [facility] office, encouraging patient to contact CSW directly if additional social work needs arise in the meantime.  Patient voiced understanding and was agreeable to this plan.  [name], BSW, MSW, LCSW  Licensed Education officer, environmental Health System  Mailing [place] [address] [place], [place] [address] Physical Address[address],  [place], [place] [address] Toll Free Main # [phone] Fax # [phone] Cell # [phone]  [email or web address]

## 2020-08-25 ENCOUNTER — Ambulatory Visit (INDEPENDENT_AMBULATORY_CARE_PROVIDER_SITE_OTHER): Payer: Medicare Other | Admitting: Licensed Clinical Social Worker

## 2020-08-25 ENCOUNTER — Other Ambulatory Visit: Payer: Self-pay

## 2020-08-25 DIAGNOSIS — F3162 Bipolar disorder, current episode mixed, moderate: Secondary | ICD-10-CM

## 2020-08-25 NOTE — Progress Notes (Signed)
Virtual Visit via Video Note  I connected with [name] on 08/25/20 at 11:00 AM EST by a video enabled telemedicine application and verified that I am speaking with the correct person using two identifiers.  Video connection was lost when less than 50% of the duration of the visit was complete, at which time the remainder of the visit was completed via audio only.   Location: Patient: home Provider: remote office [place], [place])   I discussed the limitations of evaluation and management by telemedicine and the availability of in person appointments. The patient expressed understanding and agreed to proceed.   The patient was advised to call back or seek an in-person evaluation if the symptoms worsen or if the condition fails to improve as anticipated.  I provided 45 minutes of non-face-to-face time during this encounter.   Shermika Balthaser R Atalaya Zappia, LCSW    THERAPIST PROGRESS NOTE  Session Time: 11:00-11:45a  Participation Level: Active  Behavioral Response: Neat and Well GroomedAlertAnxious  Type of Therapy: Individual Therapy  Treatment Goals addressed: Anxiety and Coping  Interventions: CBT and Supportive  Summary: [name] is a 34 y.o. female who presents with improving symptoms related to bipolar disorder.  Pt reporting that mood is stable and that she is managing stress/anxiety better.  Pt presented with positive affect throughout session.  Allowed pt to explore and express thoughts and feelings about current stressors. Pt experiencing some grief--this is the first holiday season since the death of her father. Allowed pt to process through some feelings triggered by holiday-season discussions. Discussed how grief often triggered by holiday season and discussed some interventions that can help memorialize family members throughout the holiday season.   Discussed stepchildren and biolgical children all being together throughout the holiday season, and how that can  trigger financial stress for pt. Discussed community resources and brainstormed ways that pt can make changes that could benefit family financially throughout the season.  Continued to encouraged pt to focus on self care, healthy eating habits, physical activity, positive social supports, and life balance.  Suicidal/Homicidal: No  SI, HI, or AVH reported at time of session.  Therapist Response: [name] reports some situational anxiety and stress--also reports that she feels that she is managing anxiety better and utilizing coping skills. This is reflective of overall progress. Treatment showing good evolution and development.   Plan: Return again in 3 weeks. The ongoing treatment plan includes maintaining current levels of progress and continuing to build skills to manage mood, improve stress/anxiety management, emotion regulation, distress tolerance, and behavior modification.   Diagnosis: Axis I: Bipolar, mixed    Axis II: No diagnosis    Cristina Bo Zyia Kaneko, LCSW 08/25/2020

## 2020-08-26 ENCOUNTER — Encounter (INDEPENDENT_AMBULATORY_CARE_PROVIDER_SITE_OTHER): Payer: Self-pay | Admitting: Family Medicine

## 2020-08-26 ENCOUNTER — Ambulatory Visit (INDEPENDENT_AMBULATORY_CARE_PROVIDER_SITE_OTHER): Payer: Medicare Other | Admitting: Family Medicine

## 2020-08-26 ENCOUNTER — Ambulatory Visit: Payer: Self-pay | Admitting: *Deleted

## 2020-08-26 VITALS — BP 138/83 | HR 95 | Temp 98.8°F | Ht 66.0 in | Wt 210.0 lb

## 2020-08-26 DIAGNOSIS — R632 Polyphagia: Secondary | ICD-10-CM

## 2020-08-26 DIAGNOSIS — E1143 Type 2 diabetes mellitus with diabetic autonomic (poly)neuropathy: Secondary | ICD-10-CM

## 2020-08-26 DIAGNOSIS — I152 Hypertension secondary to endocrine disorders: Secondary | ICD-10-CM

## 2020-08-26 DIAGNOSIS — R635 Abnormal weight gain: Secondary | ICD-10-CM | POA: Diagnosis not present

## 2020-08-26 DIAGNOSIS — E669 Obesity, unspecified: Secondary | ICD-10-CM | POA: Diagnosis not present

## 2020-08-26 DIAGNOSIS — E1065 Type 1 diabetes mellitus with hyperglycemia: Secondary | ICD-10-CM

## 2020-08-26 DIAGNOSIS — Z6834 Body mass index (BMI) 34.0-34.9, adult: Secondary | ICD-10-CM | POA: Diagnosis not present

## 2020-08-26 DIAGNOSIS — F3163 Bipolar disorder, current episode mixed, severe, without psychotic features: Secondary | ICD-10-CM

## 2020-08-26 DIAGNOSIS — Z599 Problem related to housing and economic circumstances, unspecified: Secondary | ICD-10-CM

## 2020-08-26 DIAGNOSIS — E1159 Type 2 diabetes mellitus with other circulatory complications: Secondary | ICD-10-CM

## 2020-08-26 DIAGNOSIS — T50905A Adverse effect of unspecified drugs, medicaments and biological substances, initial encounter: Secondary | ICD-10-CM

## 2020-08-26 DIAGNOSIS — K3184 Gastroparesis: Secondary | ICD-10-CM

## 2020-08-27 ENCOUNTER — Encounter (INDEPENDENT_AMBULATORY_CARE_PROVIDER_SITE_OTHER): Payer: Self-pay | Admitting: Family Medicine

## 2020-08-27 DIAGNOSIS — E1022 Type 1 diabetes mellitus with diabetic chronic kidney disease: Secondary | ICD-10-CM | POA: Insufficient documentation

## 2020-08-27 DIAGNOSIS — E1065 Type 1 diabetes mellitus with hyperglycemia: Secondary | ICD-10-CM | POA: Insufficient documentation

## 2020-08-27 DIAGNOSIS — R632 Polyphagia: Secondary | ICD-10-CM | POA: Insufficient documentation

## 2020-08-27 DIAGNOSIS — I152 Hypertension secondary to endocrine disorders: Secondary | ICD-10-CM | POA: Insufficient documentation

## 2020-08-27 DIAGNOSIS — E11621 Type 2 diabetes mellitus with foot ulcer: Secondary | ICD-10-CM

## 2020-08-27 DIAGNOSIS — R635 Abnormal weight gain: Secondary | ICD-10-CM | POA: Insufficient documentation

## 2020-08-27 DIAGNOSIS — H3341 Traction detachment of retina, right eye: Secondary | ICD-10-CM | POA: Insufficient documentation

## 2020-08-27 DIAGNOSIS — E1021 Type 1 diabetes mellitus with diabetic nephropathy: Secondary | ICD-10-CM | POA: Insufficient documentation

## 2020-08-27 DIAGNOSIS — E669 Obesity, unspecified: Secondary | ICD-10-CM | POA: Insufficient documentation

## 2020-08-27 DIAGNOSIS — Z6833 Body mass index (BMI) 33.0-33.9, adult: Secondary | ICD-10-CM | POA: Insufficient documentation

## 2020-08-27 DIAGNOSIS — F3163 Bipolar disorder, current episode mixed, severe, without psychotic features: Secondary | ICD-10-CM | POA: Insufficient documentation

## 2020-08-27 DIAGNOSIS — N1832 Chronic kidney disease, stage 3b: Secondary | ICD-10-CM | POA: Insufficient documentation

## 2020-08-27 DIAGNOSIS — H4312 Vitreous hemorrhage, left eye: Secondary | ICD-10-CM | POA: Insufficient documentation

## 2020-08-27 DIAGNOSIS — E1159 Type 2 diabetes mellitus with other circulatory complications: Secondary | ICD-10-CM | POA: Insufficient documentation

## 2020-08-27 DIAGNOSIS — E1040 Type 1 diabetes mellitus with diabetic neuropathy, unspecified: Secondary | ICD-10-CM | POA: Insufficient documentation

## 2020-08-27 DIAGNOSIS — Z599 Problem related to housing and economic circumstances, unspecified: Secondary | ICD-10-CM | POA: Insufficient documentation

## 2020-08-27 DIAGNOSIS — T50905A Adverse effect of unspecified drugs, medicaments and biological substances, initial encounter: Secondary | ICD-10-CM | POA: Insufficient documentation

## 2020-08-27 HISTORY — DX: Type 2 diabetes mellitus with foot ulcer: E11.621

## 2020-08-27 NOTE — Progress Notes (Signed)
Chief Complaint:   OBESITY Jamy is here to discuss her progress with her obesity treatment plan along with follow-up of her obesity related diagnoses.   Today's visit was #: 12 Starting weight: 199 lbs Starting date: 11/13/2019 Today's weight: 210 lbs Today's date: 08/26/2020 Total lbs lost to date: +11 lbs Body mass index is 33.89 kg/m.   Interim History: Amyiah has an appointment with Dr. Clovis Pu on December 2.  Her blood sugars have been less than 300 (mid 100s-mid 200s).  She says she may have missed the appointment with her nephrologist on the 15th.  She has not been eating vegetables due to gastroparesis.  Nutrition Plan: keeping a food journal and adhering to recommended goals of 1500 calories and 95 grams of protein.  Anti-obesity medications: Ozempic.  Activity: None at this time.  Current Outpatient Medications:  .  albuterol (VENTOLIN HFA) 108 (90 Base) MCG/ACT inhaler, Inhale 2 puffs into the lungs every 6 (six) hours as needed for wheezing or shortness of breath., Disp: 16 g, Rfl: 2 .  Botulinum Toxin Type A (BOTOX) 200 units SOLR, Inject 155 Units as directed every 3 (three) months. , Disp: , Rfl:  .  clonazePAM (KLONOPIN) 0.5 MG tablet, Take 1 tablet (0.5 mg total) by mouth every 8 (eight) hours as needed. for anxiety, Disp: 20 tablet, Rfl: 0 .  enalapril (VASOTEC) 20 MG tablet, Take 20 mg by mouth daily. , Disp: , Rfl:  .  fluticasone (FLONASE) 50 MCG/ACT nasal spray, Place 2 sprays into both nostrils daily. (Patient taking differently: Place 2 sprays into both nostrils daily as needed (seasonal allergies.). ), Disp: 16 g, Rfl: 2 .  furosemide (LASIX) 40 MG tablet, Take 40 mg by mouth every other day. , Disp: , Rfl:  .  gabapentin (NEURONTIN) 100 MG capsule, 2 capsules at 6 PM and 2 capsules at 9 PM, Disp: 120 capsule, Rfl: 3 .  Insulin Human (INSULIN PUMP) SOLN, Inject 1 each into the skin 3 times daily with meals, bedtime and 2 AM. Novolog: 36-38 units per day,  Disp: , Rfl:  .  levonorgestrel (MIRENA, 52 MG,) 20 MCG/24HR IUD, 1 each by Intrauterine route once., Disp: , Rfl:  .  metoCLOPramide (REGLAN) 5 MG tablet, TAKE 1 TABLET (5 MG TOTAL) BY MOUTH 3 (THREE) TIMES DAILY BEFORE MEALS., Disp: 90 tablet, Rfl: 0 .  mometasone-formoterol (DULERA) 100-5 MCG/ACT AERO, Inhale 2 puffs into the lungs 2 (two) times daily., Disp: 13 g, Rfl: 11 .  montelukast (SINGULAIR) 10 MG tablet, Take 1 tablet (10 mg total) by mouth at bedtime., Disp: 90 tablet, Rfl: 3 .  NOVOLOG 100 UNIT/ML injection, Inject 36-38 Units into the skin daily. Via insulin pump per sliding scale, Disp: , Rfl:  .  promethazine (PHENERGAN) 25 MG tablet, Take 1 tablet (25 mg total) by mouth every 8 (eight) hours as needed for nausea or vomiting., Disp: 30 tablet, Rfl: 0 .  Prucalopride Succinate (MOTEGRITY) 2 MG TABS, Take 1 tablet (2 mg total) by mouth every morning., Disp: 30 tablet, Rfl: 1 .  QUEtiapine (SEROQUEL XR) 300 MG 24 hr tablet, Take 2 tablets (600 mg total) by mouth at bedtime., Disp: 60 tablet, Rfl: 2 .  Semaglutide, 1 MG/DOSE, (OZEMPIC, 1 MG/DOSE,) 4 MG/3ML SOPN, Inject 1 mg into the skin once a week., Disp: 3 mL, Rfl: 0 .  SUMAtriptan Succinate 3 MG/0.5ML SOAJ, Inject 0.5 mLs into the skin See admin instructions. To use as needed for migraines , Disp: ,  Rfl:  .  tiZANidine (ZANAFLEX) 4 MG tablet, Take 4 mg by mouth 2 (two) times daily as needed for muscle spasms. , Disp: , Rfl:  .  Ubrogepant (UBRELVY) 100 MG TABS, Take 100 mg by mouth daily as needed (migraine headaches.). , Disp: , Rfl:  .  Vitamin D, Ergocalciferol, (DRISDOL) 1.25 MG (50000 UNIT) CAPS capsule, Take 1 capsule (50,000 Units total) by mouth every 7 (seven) days., Disp: 4 capsule, Rfl: 0  Assessment/Plan:   1. Polyphagia, Drug-induced weight gain Worsening.  Current obesogenic medications: .  gabapentin (NEURONTIN) 100 MG capsule, 2 capsules at 6 PM and 2 capsules at 9 PM, Disp: 120 capsule, Rfl: 3 .  Insulin Human  (INSULIN PUMP) SOLN, Inject 1 each into the skin 3 times daily with meals, bedtime and 2 AM. Novolog: 36-38 units per day, Disp: , Rfl:  .  QUEtiapine (SEROQUEL XR) 300 MG 24 hr tablet, Take 2 tablets (600 mg total) by mouth at bedtime., Disp: 60 tablet, Rfl: 2  Current anti-obesogenic medications: .  Semaglutide, 1 MG/DOSE, (OZEMPIC, 1 MG/DOSE,) 4 MG/3ML SOPN, Inject 1 mg into the skin once a week., Disp: 3 mL, Rfl: 0  Unfortunately, Medicare does not cover medicines for obesity, so Wegovy (Semaglutide up to 2.4 mg) is not available to this patient. I am also concerned about the side effects of gastroparesis and possible pancreatitis.   Other anti-obesity medication options for Kemiyah include the following: Topiramate, Wellbutrin. These medications would need approval from Dr. Clovis Pu and be renally dosed. Would also consider getting a new EKG.  2. Gastroparesis due to DM ([place]) Worsened after weight gain, uncontrolled blood sugars, starting Seroquel. Medication: Reglan, Rx by GI. Plan: Blood sugar goals < 200. Reconsider current medications, Seroquel, Ozempic. Diet modification reviewed:  Dietary modification is considered first-line therapy in patients with mild gastroparesis, although in clinical practice it is associated with only a modest improvement in symptoms. Foods that are fatty, acidic, spicy, and roughage-based increase overall symptoms in individuals with gastroparesis. Fat slows gastric emptying and nondigestible fiber (eg, fresh fruits and vegetables) require effective interdigestive antral motility that is frequently absent in patients with significantly delayed gastric emptying. Patients should also be advised to avoid carbonated beverages as they can aggravate gastric distention. Alcohol and smoking should also be avoided as they can decrease antral contractility and delay gastric emptying.  I will also contact GI to see what other options we have for treatment since Reglan can have  psychiatric side effects. Since Aza is a Type 1 DM, I wonder if we could consider Creon. Update: Reglan optimal choice at this time.  3. Type 1 diabetes mellitus with hyperglycemia (H[facility]) Uncontrolled. Followed by Endocrinology. Medications: Insulin pump. Rates: 1.2, 1.25. Also taking Ozempic, started to assist with DM-related polyphagia. Complications: Retinopathy, neuropathy, CKD, HTN. Need fasting lipid panel and will obtain at the next visit. Barriers: Bipolar DO and emotional eating, obesogenic medications, financial difficulties.   4. Financial difficulties Referral to Social Worker placed 08/12/20 by Dr. Juleen China. See 08/24/20 [facility] Outreach call. This has helped with some of Keajah's anxiety. Review of Dr. Casimiro Needle note shows that IOP note an option since she cannot afford her portion. Will contact the LCSW to see if there are more resources to help with this as well.  5. Bipolar 1 disorder, mixed, severe ([place]) Followed by Dr. Clovis Pu and Ragan. Appreciate his thorough notes. Latronda has a history of multiple suicide attempts and psychiatric hospitalizations. She decompensation over the fall because  of stopping all of her psychiatric medications abruptly. Prior to that, she had been doing well re: taking medications and handling life changes. Sadly, her father died of an MI over the summer. This was fairly traumatic for her because she was on the phone with him when it happened and he was visiting family in another state, so she was unable to help him. After, she and her family were asked to move from her mother's house sooner that they had expected. Currently, she and her family are living in a camper on her husband's family property.   Hillary has remained compliant on her current regimen of Seroquel XR 600 mg and has seemed much more stable. Because of her history of severe instability, changing Seroquel is not recommended at this time. Noted: The longer she is stable on Seroquel,  the less risk there is with the potential change. Vraylar was the medication considered if changed.   Plan: Contact [facility] re: IOP. Discuss medication options with Dr. Clovis Pu. My question would be whether or not we could add on another AOM - Wellbutrin, Topiramate. Also, since Reglan has been known to increase anxiety and depression, I will see if there is another option for treatment of Gavina's gastroparesis. Update: Reglan optimal treatment for patient at this time.   6. Hypertension associated with diabetes ([place]) Not optimized. Goal: < 130/80. Medications: Enalapril, Lasix. Plan: Avoid buying foods that are: processed, frozen, or prepackaged to avoid excess salt. She will continue to focus on protein-rich, low simple carbohydrate foods. We reviewed the importance of hydration, regular exercise for stress reduction, and restorative sleep.   7. Class 1 obesity with serious comorbidity and body mass index (BMI) of 33.0 to 33.9 in adult, unspecified obesity type  Nutrition goals: She has agreed to keeping a food journal and adhering to recommended goals of 1500 calories and 95 grams of protein.   Exercise goals: For substantial health benefits, adults should do at least 150 minutes (2 hours and 30 minutes) a week of moderate-intensity, or 75 minutes (1 hour and 15 minutes) a week of vigorous-intensity aerobic physical activity, or an equivalent combination of moderate- and vigorous-intensity aerobic activity. Aerobic activity should be performed in episodes of at least 10 minutes, and preferably, it should be spread throughout the week.  Behavioral modification strategies: increasing lean protein intake, decreasing simple carbohydrates, increasing vegetables, increasing water intake and decreasing liquid calories.  Delaynie has agreed to follow-up with our clinic in 3 weeks. She was informed of the importance of frequent follow-up visits to maximize her success with intensive lifestyle modifications for  her multiple health conditions.   Objective:   Blood pressure 138/83, pulse 95, temperature 98.8 F (37.1 C), height 5\' 6"  (1.676 m), weight 210 lb (95.3 kg), SpO2 98 %. Body mass index is 33.89 kg/m.  General: Cooperative, alert, well developed, in no acute distress. HEENT: Conjunctivae and lids unremarkable. Cardiovascular: Regular rhythm.  Lungs: Normal work of breathing. Neurologic: No focal deficits.   Lab Results  Component Value Date   CREATININE 1.55 (H) 07/29/2020   BUN 16 07/29/2020   NA 140 07/29/2020   K 5.1 07/29/2020   CL 102 07/29/2020   CO2 25 07/29/2020   Lab Results  Component Value Date   ALT 36 (H) 07/29/2020   AST 24 07/29/2020   ALKPHOS 209 (H) 07/29/2020   BILITOT 0.3 07/29/2020   Lab Results  Component Value Date   HGBA1C 9.8 (H) 07/29/2020   HGBA1C 9.5 (H) 04/15/2020  HGBA1C 8.0 (H) 09/09/2019   HGBA1C 9.0 04/05/2017   HGBA1C 11.2 (H) 11/24/2015   Lab Results  Component Value Date   TSH 1.530 04/15/2020   Lab Results  Component Value Date   CHOL 149 04/22/2014   HDL 71 04/22/2014   LDLCALC 68 04/22/2014   TRIG 49 04/22/2014   CHOLHDL 2.1 04/22/2014   Lab Results  Component Value Date   WBC 6.1 04/15/2020   HGB 13.2 04/15/2020   HCT 39.7 04/15/2020   MCV 89 04/15/2020   PLT 256 04/15/2020   Lab Results  Component Value Date   IRON 41 11/13/2019   TIBC 267 11/13/2019   FERRITIN 77 11/13/2019   Obesity Behavioral Intervention:   Approximately 15 minutes were spent on the discussion below.  ASK: We discussed the diagnosis of obesity with Adreona today and Lotoya agreed to give [name] permission to discuss obesity behavioral modification therapy today.  ASSESS: Jalaine has the diagnosis of obesity and her BMI today is 34.0. Jalexis is in the action stage of change.   ADVISE: Linea was educated on the multiple health risks of obesity as well as the benefit of weight loss to improve her health. She was advised of the need  for long term treatment and the importance of lifestyle modifications to improve her current health and to decrease her risk of future health problems.  AGREE: Multiple dietary modification options and treatment options were discussed and Lewanda agreed to follow the recommendations documented in the above note.  ARRANGE: Lorelai was educated on the importance of frequent visits to treat obesity as outlined per CMS and USPSTF guidelines and agreed to schedule her next follow up appointment today.  Attestation Statements:   Reviewed by clinician on day of visit: allergies, medications, problem list, medical history, surgical history, family history, social history, and previous encounter notes.  I, Water quality scientist, CMA, am acting as transcriptionist for Briscoe Deutscher, DO  I have reviewed the above documentation for accuracy and completeness, and I agree with the above. Briscoe Deutscher, DO

## 2020-09-09 ENCOUNTER — Telehealth (INDEPENDENT_AMBULATORY_CARE_PROVIDER_SITE_OTHER): Payer: Medicare Other | Admitting: Psychiatry

## 2020-09-09 ENCOUNTER — Encounter: Payer: Self-pay | Admitting: Psychiatry

## 2020-09-09 DIAGNOSIS — F5105 Insomnia due to other mental disorder: Secondary | ICD-10-CM | POA: Diagnosis not present

## 2020-09-09 DIAGNOSIS — Z79899 Other long term (current) drug therapy: Secondary | ICD-10-CM

## 2020-09-09 DIAGNOSIS — F411 Generalized anxiety disorder: Secondary | ICD-10-CM

## 2020-09-09 DIAGNOSIS — F4001 Agoraphobia with panic disorder: Secondary | ICD-10-CM

## 2020-09-09 DIAGNOSIS — Z0189 Encounter for other specified special examinations: Secondary | ICD-10-CM

## 2020-09-09 DIAGNOSIS — G2581 Restless legs syndrome: Secondary | ICD-10-CM

## 2020-09-09 DIAGNOSIS — F3162 Bipolar disorder, current episode mixed, moderate: Secondary | ICD-10-CM

## 2020-09-09 MED ORDER — BUPROPION HCL ER (SR) 100 MG PO TB12: ORAL_TABLET | ORAL | 1 refills | Status: DC

## 2020-09-09 MED ORDER — TOPIRAMATE 25 MG PO TABS: ORAL_TABLET | ORAL | 1 refills | Status: DC

## 2020-09-09 NOTE — Progress Notes (Signed)
[name] [id number] 08-19-1986 34 y.o.   Video Visit via My Chart  I connected with pt by My Chart and verified that I am speaking with the correct person using two identifiers.   I discussed the limitations, risks, security and privacy concerns of performing an evaluation and management service by My Chart  and the availability of in person appointments. I also discussed with the patient that there may be a patient responsible charge related to this service. The patient expressed understanding and agreed to proceed.  I discussed the assessment and treatment plan with the patient. The patient was provided an opportunity to ask questions and all were answered. The patient agreed with the plan and demonstrated an understanding of the instructions.   The patient was advised to call back or seek an in-person evaluation if the symptoms worsen or if the condition fails to improve as anticipated.  I provided 30 minutes of video time during this encounter.  The patient was located at home and the provider was located office. Session started at 1030 and ended at 11   Subjective:   Patient ID:  [name] is a 34 y.o. (DOB [birth year]) female.  Chief Complaint:  Chief Complaint  Patient presents with  . Follow-up  . Depression  . Manic Behavior  . Stress  . Medication Problem  . Anxiety    Depression        [name] presents to the office today for follow-up of bipolar disorder and generalized anxiety disorder.  When seen June 05, 2019.  The following changes were made: Change pramipexole ER and increase to 0.75 mg pm to rid nausea and improve mood in the afternoon.  For tiredness in the afternoon switch Equetro to 400 mg AM , 1@ 6 and 1 at 11. For panic in daytime start gabapentin 300 mg each am as preventative.  She's only taking it prn now. We continued unchanged Latuda 120 mg and lamotrigine 300 mg twice daily for bipolar disorder. She just got the ER pramipexole  about a week ago.  Can't tell difference with nausea DT  gastroparesis worse lately with constipation Less afternoon tiredness with current split Equetro dose.    Last seen July 22, 2019.  Meds were not changed at that visit.  She called back to October 20 wanting to try valproate because of racing thoughts and difficulty sleeping.  Stating the trazodone was not working.  She was told it was okay to start Depakote ER 500 mg nightly and then gradually increased to 1500 mg nightly.  She called back again on October 6 and this physician noted the following: RTC Pt called to report new med she started having a lot of side effects mood swings, sounds in ears, suicidal thoughts, tremors. Causing issues with husband She is currently taking Depakote ER 1500 mg nightly. She commits to safety. The only symptom directly attributable to Depakote would be the tremor.  However its presence would indicate she is not going to be able to tolerate a higher dose of Depakote to control her mood symptoms.  Therefore we will wean the Depakote. In order to control the mood symptoms we will increase the Equetro at the evening dose which should not cause significant side effects problems during the day but help with mood stability.  This increase should also make it easier to wean the Depakote quickly.  She agrees to this plan.  She will call back if the suicidal thoughts become too intense to tolerate. Reduce  Depakote ER 500 mg tablets to 1 a night for 4 nights then stop Increase Equetro 200 mg capsules to 2 in the morning and 3 nightly. Cristina Parents, MD, DFAPA  She's been back on Equetro and doing OK. 2 weeks leading to Xmas with a lot of anger and lashing out a good bit.  Had run out of mirapex and Latuda for a couple of days in that time frame.  Crying is less now.  No SE CBZ now.  A lot of migraines off her Trokendi bc risk kidney stones.  Plans Botox for HA.  Usually sleeping OK without trazodone.   Still  taking Latuda, buspirone, gabapentin, lamotrigine 300 mg BID.    seen 12.30.21 without med change.    12/17/19 appt with the following noted: Since then increased gabapentin for anxiety to 600 BID-TID which helped but caused some tiredness.  Needing trazodone more bc EMA/EFA.  She's stopped caffeine.  Mood stability is getting better despite a lot of stress. $ stress and racing thoughts at night and scattered some with stressors.  Depression is mild lately.  Took trip with [name] last week helped.  Coming up on 2 year anniversary of [name]'s death.  [name] diabetic died last year with DM and kidney failure. Not as much anger irritability.  Some mood swings early FEB but better now. Custody battle with [name].  He's nicer to her now.   Weight going up. Hates weight gain effects of meds.    Consistent with meds.  No sig SI other than fleeting rarely.  Scattered concentration and racing thoughts randomly.   Sleep OK. Plan: No med changes/ FU 4 mos  06/18/20 TC saying she stopped all psych meds end of August.  06/22/20 appt with the following noted: Had gradually dropped off meds and not sure why.  Stoped Latuda, buspirone, CBZ. Pramipexole., and lamotrigine.  Sleep Ok without trazodone.  Gabapentin less tolerated as prn. Stopped Equetro over a month ago DT insurance.  Back on regular medicare and it might be covered. A lot of stress here lately. Lost father 01/01/20 of MI.  since here and called got propranolol and it hleped crying.  Also got some clonazepam.   Had given up on life and everything.  Not SI today but has been.  No plan.  Depression and mood swings including anger.  Anxiety. She and therapist say she's depressed and recommended IOP at [name]. Agrees to retry Seroquel which helped in the past Plan: Acute decompensation because of psychiatric noncompliance. quetiapine ER 1 at night for 4 nights, then 2 at night for 4 nights, then 3 at night.  07/13/2020 appointment with the following noted: Patient called in  between appointments complaining of quetiapine call causing restless legs.  Gabapentin was called in with instructions to make this side effect resolve. Misunderstood and only taking 300 mg Seroquel XR for a couple of days. Referred to [facility] and has continued therapy. Feels better on Seroquel.  No longer SI.  More stable.  Less anger.  Depression down to mild generally.  Fewer panic attacks. Sleeps with Seroquel 8 hours.  RLS is managed fairly well at this time. Initially too tired with Seroquel and better now. Clonazepam once in 2 weeks for panic driving. Referred to IOP and continued Seroquel ER 600 HS  08/10/20 appt with the following noted: Insurance wouldn't cover enough of IOP.  Still seeing therapist every 2 weeks. Consistent with Seroquel XR 600 daily and needs gabapentin with it DT RLS.  No longer taking propranolol. Taking clonazepam only twice in last month. I feel pretty stable right now.  First Xmas without father.  Feels better than mos ago.  More stable.  Sleep good.  Hangover if late with Seroquel.  Not unusually angry, under control.  Depression is better.  Occ down day.  Function is pretty good with routine things.   Energy is not great esp in the AM Gabapentin will make her feel too drunk if dose is too high. SE Seroquel makes her hungry. Plan: no med changes  09/09/2020 appointment with the following noted: Gabapentin manages the RLS. Compliant with Seroquel XR 600 mg HS. Kind of stressed out and sort of moody.  Mild mood swings with some on edge and agitated easily.  No SI. Kind of down over weight management visit and lack of progress.  No energy to want to do anything.  Sleep is fine with Seroquel.  In bed at least 8 hours daily. Used Klonopin twice since last visit.  Triggered panic attack last week. Back on Reglan for 2 mos.  Remarried April 2020.  34 yo D ADD is stressful and disrespectful. Disability review approved mostly for psych reasons.  Going to  [facility] weight [facility] and just started vitamin D. Level 24.5.  Past Psychiatric Medication Trials: lamotrigine 300 twice daily, Equetro 900 daily,  Depakote 1500 SE, History topiramate for migraine brief Vraylar, Abilify 15,  lithium felt slowed down,  Seroquel worked but stopped DT insurance,  Latuda 120, sertraline, duloxetine,  gabapentin 600 mg 3 times daily tiredness,  buspirone 30 twice daily,,  clonazepam, trazodone, Xanax , hydroxyzine NR,  History of suicidal ideation and about 5 suicide attempts with about 4 psychiatric hospitalizations  Review of Systems:  Review of Systems  Cardiovascular: Negative for palpitations.  Gastrointestinal: Negative for nausea and vomiting.  Musculoskeletal: Positive for arthralgias.  Neurological: Negative for tremors and weakness.  Psychiatric/Behavioral: Positive for depression and sleep disturbance.    Medications: I have reviewed the patient's current medications.  Current Outpatient Medications  Medication Sig Dispense Refill  . albuterol (PROVENTIL) (2.5 MG/3ML) 0.083% nebulizer solution Take 3 mLs (2.5 mg total) by nebulization every 6 (six) hours as needed for wheezing or shortness of breath. 150 mL 1  . albuterol (VENTOLIN HFA) 108 (90 Base) MCG/ACT inhaler Inhale 2 puffs into the lungs every 6 (six) hours as needed for wheezing or shortness of breath. 16 g 2  . Botulinum Toxin Type A (BOTOX) 200 units SOLR Inject 155 Units as directed every 3 (three) months.     . clonazePAM (KLONOPIN) 0.5 MG tablet Take 1 tablet (0.5 mg total) by mouth every 8 (eight) hours as needed. for anxiety 20 tablet 0  . enalapril (VASOTEC) 20 MG tablet Take 20 mg by mouth daily.     . fluticasone (FLONASE) 50 MCG/ACT nasal spray Place 2 sprays into both nostrils daily. (Patient taking differently: Place 2 sprays into both nostrils daily as needed (seasonal allergies.). ) 16 g 2  . furosemide (LASIX) 40 MG tablet Take 40 mg by mouth every other day.     .  gabapentin (NEURONTIN) 100 MG capsule 2 capsules at 6 PM and 2 capsules at 9 PM 120 capsule 3  . GLUCAGON EMERGENCY 1 MG injection Inject 1 mg into the skin as needed (hypoglycemia.).     . Insulin Human (INSULIN PUMP) SOLN Inject 1 each into the skin 3 times daily with meals, bedtime and 2 AM. Novolog: 36-38 units per day    .  Insulin Pen Needle 32G X 4 MM MISC 1 each by Does not apply route once a week. 50 each 0  . levonorgestrel (MIRENA, 52 MG,) 20 MCG/24HR IUD 1 each by Intrauterine route once.    . metoCLOPramide (REGLAN) 5 MG tablet TAKE 1 TABLET (5 MG TOTAL) BY MOUTH 3 (THREE) TIMES DAILY BEFORE MEALS. 90 tablet 0  . mometasone-formoterol (DULERA) 100-5 MCG/ACT AERO Inhale 2 puffs into the lungs 2 (two) times daily. 13 g 11  . montelukast (SINGULAIR) 10 MG tablet Take 1 tablet (10 mg total) by mouth at bedtime. 90 tablet 3  . NOVOLOG 100 UNIT/ML injection Inject 36-38 Units into the skin daily. Via insulin pump per sliding scale    . polyethylene glycol (MIRALAX / GLYCOLAX) 17 g packet Take 17 g by mouth 2 (two) times daily as needed (constipation). 14 each 0  . promethazine (PHENERGAN) 25 MG tablet Take 1 tablet (25 mg total) by mouth every 8 (eight) hours as needed for nausea or vomiting. 30 tablet 0  . Prucalopride Succinate (MOTEGRITY) 2 MG TABS Take 1 tablet (2 mg total) by mouth every morning. 30 tablet 1  . QUEtiapine (SEROQUEL XR) 300 MG 24 hr tablet Take 2 tablets (600 mg total) by mouth at bedtime. 60 tablet 2  . Semaglutide, 1 MG/DOSE, (OZEMPIC, 1 MG/DOSE,) 4 MG/3ML SOPN Inject 1 mg into the skin once a week. 3 mL 0  . SUMAtriptan Succinate 3 MG/0.5ML SOAJ Inject 0.5 mLs into the skin See admin instructions. To use as needed for migraines     . tiZANidine (ZANAFLEX) 4 MG tablet Take 4 mg by mouth 2 (two) times daily as needed for muscle spasms.     Marland Kitchen Ubrogepant (UBRELVY) 100 MG TABS Take 100 mg by mouth daily as needed (migraine headaches.).     Marland Kitchen Vitamin D, Ergocalciferol,  (DRISDOL) 1.25 MG (50000 UNIT) CAPS capsule Take 1 capsule (50,000 Units total) by mouth every 7 (seven) days. 4 capsule 0  . buPROPion (WELLBUTRIN SR) 100 MG 12 hr tablet 1 twice daily for 1 week then 2 twice daily 120 tablet 1  . topiramate (TOPAMAX) 25 MG tablet 1 tablet in the morning for 1 week, then 1 tablet in the morning and 1 hour before evening meal for 1 week, then 2 tablets in the morning and 1 tablet in the evening for 1 week, then 2 tablets in the morning and 2 tablets 1 hour before evening meal 120 tablet 1   No current facility-administered medications for this visit.    Medication Side Effects: tired in the afternoon.  Allergies:  Allergies  Allergen Reactions  . Toradol [Ketorolac Tromethamine]     Cannot take d/t kidney issues   . Ceftin Rash    Past Medical History:  Diagnosis Date  . Anemia   . Anemia of chronic disease 06/07/2012   Formatting of this note might be different from the original. Hematology consultation 07-13-16 (see note)--> Anemia of chronic disease Required 3 transfusions during pregnancy  Last Assessment & Plan:  Formatting of this note might be different from the original. She has now required 3 blood transfusions this pregnancy for her anemia of chronic disease. The most recent was one week ago when she bec  . Anxiety   . Asthma   . Asthma 11/10/2013  . Back pain   . Bipolar 1 disorder ([place])   . Chest pain   . Chronic renal failure syndrome, stage 3 (moderate) (HCC)   . Colles'  fracture of left radius 10/11/2013  . Complication of anesthesia   . Constipation   . Depression   . Depression   . Diabetic gastroparesis ([place])   . Diabetic neuropathy, type I diabetes mellitus ([place])   . Diabetic ulcer of right great toe ([place]) 08/27/2020  . Edema, lower extremity   . Gallbladder disease   . Gastroparesis   . GERD (gastroesophageal reflux disease)   . Headache(784.0)   . Hypertension   . Joint pain   . Kidney stones   . Palpitations   .  Polyneuropathy in diabetes(357.2)   . PONV (postoperative nausea and vomiting)   . 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 S/P carpal tunnel release left 10/16/19 11/04/2019  . Shortness of breath   . Tachycardia    baseline tachycardia   . 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 Hypertension Other   . Diabetes Mother        type 2  . Hypertension Mother   . Obesity Mother   . Diabetes Brother        type 1  . Renal Disease Brother        renal failure  . Hypertension Brother   . Diabetes Father   . Hypertension Father   . High Cholesterol Father   . Sleep apnea Father   . Obesity Father   . Heart disease Father   . Breast cancer Maternal Aunt 64  . Colon cancer Maternal Aunt   . Uterine cancer Maternal Aunt 65       same as breast cancer  . Uterine cancer Maternal Aunt 61  . Rectal cancer Neg Hx   . Esophageal cancer Neg Hx     Social History   Socioeconomic History  . Marital status: Legally Separated    Spouse name: Not on file  . Number of children: 2  . Years of education: 46  . Highest education level: 12th grade  Occupational History  . Occupation: Home maker  Tobacco Use  . Smoking status: Never Smoker  . Smokeless tobacco: Never Used  Vaping Use  . Vaping Use: Never used  Substance and Sexual Activity  . Alcohol use: No  . Drug use: No  . Sexual activity: Yes    Partners: Male    Birth control/protection: I.U.D.  Other Topics Concern  . Not on file  Social History Narrative   Pt has a daughter and boyfriend.    Dad passed away suddenly heart attack 12/2019   Social Determinants of Health   Financial Resource Strain: High Risk  . Difficulty of Paying Living Expenses: Very hard  Food Insecurity: Food Insecurity Present  . Worried About Charity fundraiser in the Last Year: Sometimes true  . Ran Out of Food in the Last Year: Sometimes true   Transportation Needs: No Transportation Needs  . Lack of Transportation (Medical): No  . Lack of Transportation (Non-Medical): No  Physical Activity: Inactive  . Days of Exercise per Week: 0 days  . Minutes of Exercise per Session: 0 min  Stress: Stress Concern Present  . Feeling of Stress : Very much  Social Connections: Moderately Integrated  . Frequency of Communication with Friends and Family: More than three times a week  . Frequency of Social Gatherings with Friends and Family: More than three times a week  . Attends Religious Services: More than  4 times per year  . Active Member of Clubs or Organizations: No  . Attends Archivist Meetings: Never  . Marital Status: Living with partner  Intimate Partner Violence: Not At Risk  . Fear of Current or Ex-Partner: No  . Emotionally Abused: No  . Physically Abused: No  . Sexually Abused: No    Past Medical History, Surgical history, Social history, and Family history were reviewed and updated as appropriate.   Please see review of systems for further details on the patient's review from today.   Objective:   Physical Exam:  There were no vitals taken for this visit.  Physical Exam Constitutional:      Appearance: She is obese.  Neurological:     Mental Status: She is alert and oriented to person, place, and time.     Cranial Nerves: No dysarthria.  Psychiatric:        Attention and Perception: Attention and perception normal.        Mood and Affect: Mood is anxious and depressed. Affect is not angry.        Speech: Speech normal.        Behavior: Behavior is cooperative.        Thought Content: Thought content normal. Thought content is not paranoid or delusional. Thought content does not include homicidal or suicidal ideation. Thought content does not include homicidal or suicidal plan.        Cognition and Memory: Cognition and memory normal.        Judgment: Judgment normal.     Comments: Insight intact       Lab Review:     Component Value Date/Time   NA 140 07/29/2020 1246   K 5.1 07/29/2020 1246   CL 102 07/29/2020 1246   CO2 25 07/29/2020 1246   GLUCOSE 111 ([name]) 07/29/2020 1246   GLUCOSE 147 ([name]) 10/14/2019 0920   BUN 16 07/29/2020 1246   CREATININE 1.55 ([name]) 07/29/2020 1246   CREATININE 1.81 ([name]) 06/17/2018 1231   CALCIUM 9.5 07/29/2020 1246   PROT 6.5 07/29/2020 1246   ALBUMIN 4.1 07/29/2020 1246   AST 24 07/29/2020 1246   ALT 36 ([name]) 07/29/2020 1246   ALKPHOS 209 ([name]) 07/29/2020 1246   BILITOT 0.3 07/29/2020 1246   GFRNONAA 43 (L) 07/29/2020 1246   GFRNONAA 36 (L) 06/17/2018 1231   GFRAA 50 (L) 07/29/2020 1246   GFRAA 42 (L) 06/17/2018 1231       Component Value Date/Time   WBC 6.1 04/15/2020 1635   WBC 7.9 10/14/2019 0920   RBC 4.48 04/15/2020 1635   RBC 4.52 10/14/2019 0920   HGB 13.2 04/15/2020 1635   HCT 39.7 04/15/2020 1635   PLT 256 04/15/2020 1635   MCV 89 04/15/2020 1635   MCH 29.5 04/15/2020 1635   MCH 29.6 10/14/2019 0920   MCHC 33.2 04/15/2020 1635   MCHC 32.0 10/14/2019 0920   RDW 12.3 04/15/2020 1635   LYMPHSABS 1.7 04/15/2020 1635   MONOABS 0.8 10/14/2019 0920   EOSABS 0.6 ([name]) 04/15/2020 1635   BASOSABS 0.1 04/15/2020 1635    No results found for: POCLITH, LITHIUM   No results found for: PHENYTOIN, PHENOBARB, VALPROATE, CBMZ   .res Assessment: Plan:    Laila was seen today for follow-up, depression, manic behavior, stress, medication problem and anxiety.  Diagnoses and all orders for this visit:  Bipolar 1 disorder, mixed, severe  Generalized anxiety disorder  Panic disorder with agoraphobia  Insomnia due to mental condition  Restless leg syndrome, controlled  Assessment for weight gain due to psychotropic drugs -     buPROPion (WELLBUTRIN SR) 100 MG 12 hr tablet; 1 twice daily for 1 week then 2 twice daily -     topiramate (TOPAMAX) 25 MG tablet; 1 tablet in the morning for 1 week, then 1 tablet in the morning and 1 hour before  evening meal for 1 week, then 2 tablets in the morning and 1 tablet in the evening for 1 week, then 2 tablets in the morning and 2 tablets 1 hour before evening meal    Greater than 50% of 30 min non face to face time with patient was spent on counseling and coordination of care. We discussed Patient with a history of multiple med failures and history of psychiatric hospitalization and suicide attempts recent worsening of bipolar depression.  Chronic stressors contribute to mood and anxiety problems.   Concern about potential weight gain with medications.  She has taken the major mood stabilizers with low weight gain potential. Discussed the pros and cons of switching mood stabilizers.  Could consider ultra low-dose lithium.   She recently failed to tolerate VPA.    Patient decompensated because of abruptly stopping all of her psych medications.  She had been doing reasonably well at the last appointment on multiple medications until stopped all her meds .  She is seeing a therapist.   She has regained good stability on Seroquel XR 600 mg every afternoon with side effects of restlessness and hunger.  Restlessness managed with low-dose gabapentin.  Extensive discussion of the appointment with Dr. Juleen China and Dr. Alcario Drought notes were reviewed.  We discussed the pros and cons of various options.  We may eventually try switching her to Vraylar but that is a very different type of mood stabilizer and there is significant risk of destabilization at least in the transition.  We will wait till after the holidays to try this approach. Dr. Alcario Drought suggestion of the combination of Wellbutrin and Topamax is an excellent 1.  There is some risk of Wellbutrin triggering manic symptoms but this is reasonably low.  We discussed this risk and the other side effects associated with this combination of medication. Prescription given with schedule to increase to the target dose of Wellbutrin SR 100 mg tablets 2 twice daily  and topiramate 25 mg tablets 2 twice daily.  Consider retrying Vraylar bc weight gain.  But this is a very different med Medication from Seroquel and she is got good stability at the present on Seroquel with a recent history of severe instability.  Would recommend not changing this medication at this time if possible.  The longer she is stable on Seroquel the less risk there is with the potential change.  Discussed potential metabolic side effects associated with atypical antipsychotics, as well as potential risk for movement side effects. Advised pt to contact office if movement side effects occur.   Disc weight management appt with Dr. Juleen China and her recommendations and suggestions.  Pt gets hungry from Seroquel.  Her insurance won't cover weight loss meds.  RLS Not a problem now.  quetiapine ER 600 mg HS.    Follow-up 2 to 3 months  Cristina Parents, MD, DFAPA   Please see After Visit Summary for patient specific instructions.  Future Appointments  Date Time Provider [place]  09/15/2020 12:00 PM Briscoe Deutscher, DO [facility] None  09/16/2020 11:00 AM Hussami, Christina R, LCSW [facility] None    No orders of  the defined types were placed in this encounter.   -------------------------------

## 2020-09-14 ENCOUNTER — Other Ambulatory Visit: Payer: Self-pay | Admitting: *Deleted

## 2020-09-14 ENCOUNTER — Encounter: Payer: Self-pay | Admitting: *Deleted

## 2020-09-14 NOTE — Patient Outreach (Signed)
[facility]) Care Management  09/14/2020  [name] Jun 05, 1986 [id number]   CSW was able to make contact with patient today to follow-up regarding social work services and resources, as well as to ensure that patient received the packet of resource information that CSW e-mailed to her several weeks ago, per her request.  Patient confirmed receipt, indicating that she will be receiving financial assistance for their 4 children for Christmas, through [employer] ([address]), [employer] account planner) and [employer] ([place]).  Patient reported that she is still waiting to hear back from [employer], [place], [place] and her daughter's school. However, patient stated, "These 3 charitable organizations should be more than sufficient".  Patient further reported that she completed the [employer] (Supplemental Nutrition Assistance Program) Applications that CSW e-mailed to her, submitting them to the [place], already receiving approval for financial assistance for her husband and two children (currently residing with patient permanently).  Patient went on to explain that between the 4 of them, they will be receiving $523.00 per month in nutritional assistance.  Patient is just waiting for their EBT (Electronic Benefit Transfer) cards to be mailed to her home, expecting them any day.  Patient admitted to currently having enough food in their home to feed their family for at least one additional week.  CSW will perform a case closure on patient, as all goals of treatment have been met from social work standpoint and no additional social work needs have been identified at this time.  CSW will fax an update to patient's Primary Care Physician, Dr. Jenna Luo to ensure that he is aware of CSW's involvement with patient's plan of care, in addition to routing Dr. Dennard Schaumann a Physician Case  Closure Letter.  CSW was able to confirm that patient has the correct contact information for CSW, prior to terminating the call, encouraging patient to contact CSW directly if additional social work needs arise in the near future, if patient requires food assistance until her EBT cards arrive.  Patient voiced understanding and was agreeable to this plan.    [name], BSW, MSW, LCSW  Licensed Education officer, [facility]  Mailing [place] [address] [place], [place] [address] Physical Address-[address], [place], [place] [address] Toll Free Main # [phone] Fax # [phone] Cell # [phone]  [name]

## 2020-09-15 ENCOUNTER — Other Ambulatory Visit: Payer: Self-pay

## 2020-09-15 ENCOUNTER — Ambulatory Visit (INDEPENDENT_AMBULATORY_CARE_PROVIDER_SITE_OTHER): Payer: Medicare Other | Admitting: Family Medicine

## 2020-09-15 ENCOUNTER — Encounter (INDEPENDENT_AMBULATORY_CARE_PROVIDER_SITE_OTHER): Payer: Self-pay | Admitting: Family Medicine

## 2020-09-15 VITALS — BP 124/73 | HR 114 | Temp 97.7°F | Ht 66.0 in | Wt 206.0 lb

## 2020-09-15 DIAGNOSIS — E1069 Type 1 diabetes mellitus with other specified complication: Secondary | ICD-10-CM | POA: Diagnosis not present

## 2020-09-15 DIAGNOSIS — K3184 Gastroparesis: Secondary | ICD-10-CM | POA: Diagnosis not present

## 2020-09-15 DIAGNOSIS — F3289 Other specified depressive episodes: Secondary | ICD-10-CM

## 2020-09-15 DIAGNOSIS — E669 Obesity, unspecified: Secondary | ICD-10-CM

## 2020-09-16 ENCOUNTER — Ambulatory Visit: Payer: Medicare Other | Admitting: Licensed Clinical Social Worker

## 2020-09-16 MED ORDER — OZEMPIC (1 MG/DOSE) 4 MG/3ML ~~LOC~~ SOPN: 1.0000 mg | PEN_INJECTOR | SUBCUTANEOUS | 0 refills | Status: DC

## 2020-09-16 NOTE — Progress Notes (Signed)
Chief Complaint:   OBESITY [name] is here to discuss her progress with her obesity treatment plan along with follow-up of her obesity related diagnoses.   Today's visit was #: 13 Starting weight: 199 lbs Starting date: 11/13/2019 Today's weight: 206 lbs Today's date: 09/15/2020 Total lbs lost to date: +7 lbs Body mass index is 33.25 kg/m.   Interim History: [name] says she is feeling better regarding mood.  She is working on portion control.  She is not getting any dedicated exercise, but lots of movement.  This was her first Thanksgiving without her father. Nutrition Plan: keeping a food journal and adhering to recommended goals of 1500 calories and 100 grams of protein for 75% of the time.  Anti-obesity medications: Ozempic. Reported side effects: None. Hunger is well controlled. Cravings are well controlled.  Activity: None at this time.  Assessment/Plan:   1. Type 1 diabetes mellitus with other specified complication (HCC) Diabetes Mellitus: needs improvement. Medication: Ozempic, insulin pump. Issues reviewed with her: blood sugar goals, complications of diabetes mellitus, hypoglycemia prevention and treatment, exercise, and nutrition.  Lab Results  Component Value Date   HGBA1C 9.8 (H) 07/29/2020   HGBA1C 9.5 (H) 04/15/2020   HGBA1C 8.0 (H) 09/09/2019   Lab Results  Component Value Date   LDLCALC 68 04/22/2014   CREATININE 1.55 (H) 07/29/2020   - Refill Semaglutide, 1 MG/DOSE, (OZEMPIC, 1 MG/DOSE,) 4 MG/3ML SOPN; Inject 1 mg into the skin once a week.  Dispense: 9 mL; Refill: 0  2. Gastroparesis Improved with Reglan.  Dietary modification is considered first-line therapy in patients with mild gastroparesis, although in clinical practice it is associated with only a modest improvement in symptoms. Foods that are fatty, acidic, spicy, and roughage-based increase overall symptoms in individuals with gastroparesis. Fat slows gastric emptying and nondigestible fiber  (eg, fresh fruits and vegetables) require effective interdigestive antral motility that is frequently absent in patients with significantly delayed gastric emptying. Patients should also be advised to avoid carbonated beverages as they can aggravate gastric distention. Alcohol and smoking should also be avoided as they can decrease antral contractility and delay gastric emptying.  3. Other depression, with emotional eating Improving.  Now taking Wellbutrin and topiramate. We will continue to monitor symptoms as they relate to her weight loss journey.  4. Class 1 obesity with serious comorbidity and body mass index (BMI) of 33.0 to 33.9 in adult, unspecified obesity type  Course: [name] is currently in the action stage of change. As such, her goal is to continue with weight loss efforts.   Nutrition goals: She has agreed to keeping a food journal and adhering to recommended goals of 1500 calories and 100 grams of protein.   Exercise goals: For substantial health benefits, adults should do at least 150 minutes (2 hours and 30 minutes) a week of moderate-intensity, or 75 minutes (1 hour and 15 minutes) a week of vigorous-intensity aerobic physical activity, or an equivalent combination of moderate- and vigorous-intensity aerobic activity. Aerobic activity should be performed in episodes of at least 10 minutes, and preferably, it should be spread throughout the week.  Behavioral modification strategies: increasing lean protein intake, decreasing simple carbohydrates, increasing vegetables, increasing water intake, dealing with family or coworker sabotage, travel eating strategies, holiday eating strategies  and celebration eating strategies.  [name] has agreed to follow-up with our clinic in 4 weeks. She was informed of the importance of frequent follow-up visits to maximize her success with intensive lifestyle modifications for her  multiple health conditions.   Objective:   Blood pressure 124/73,  pulse (!) 114, temperature 97.7 F (36.5 C), temperature source Oral, height 5\' 6"  (1.676 m), weight 206 lb (93.4 kg), SpO2 97 %. Body mass index is 33.25 kg/m.  General: Cooperative, alert, well developed, in no acute distress. HEENT: Conjunctivae and lids unremarkable. Cardiovascular: Regular rhythm.  Lungs: Normal work of breathing. Neurologic: No focal deficits.   Lab Results  Component Value Date   CREATININE 1.55 (H) 07/29/2020   BUN 16 07/29/2020   NA 140 07/29/2020   K 5.1 07/29/2020   CL 102 07/29/2020   CO2 25 07/29/2020   Lab Results  Component Value Date   ALT 36 (H) 07/29/2020   AST 24 07/29/2020   ALKPHOS 209 (H) 07/29/2020   BILITOT 0.3 07/29/2020   Lab Results  Component Value Date   HGBA1C 9.8 (H) 07/29/2020   HGBA1C 9.5 (H) 04/15/2020   HGBA1C 8.0 (H) 09/09/2019   HGBA1C 9.0 04/05/2017   HGBA1C 11.2 (H) 11/24/2015   Lab Results  Component Value Date   TSH 1.530 04/15/2020   Lab Results  Component Value Date   CHOL 149 04/22/2014   HDL 71 04/22/2014   LDLCALC 68 04/22/2014   TRIG 49 04/22/2014   CHOLHDL 2.1 04/22/2014   Lab Results  Component Value Date   WBC 6.1 04/15/2020   HGB 13.2 04/15/2020   HCT 39.7 04/15/2020   MCV 89 04/15/2020   PLT 256 04/15/2020   Lab Results  Component Value Date   IRON 41 11/13/2019   TIBC 267 11/13/2019   FERRITIN 77 11/13/2019   Obesity Behavioral Intervention:   Approximately 15 minutes were spent on the discussion below.  ASK: We discussed the diagnosis of obesity with [name] today and [name] agreed to give [name] permission to discuss obesity behavioral modification therapy today.  ASSESS: [name] has the diagnosis of obesity and her BMI today is 33.4. [name] is in the action stage of change.   ADVISE: [name] was educated on the multiple health risks of obesity as well as the benefit of weight loss to improve her health. She was advised of the need for long term treatment and the importance of  lifestyle modifications to improve her current health and to decrease her risk of future health problems.  AGREE: Multiple dietary modification options and treatment options were discussed and [name] agreed to follow the recommendations documented in the above note.  ARRANGE: [name] was educated on the importance of frequent visits to treat obesity as outlined per CMS and USPSTF guidelines and agreed to schedule her next follow up appointment today.  Attestation Statements:   Reviewed by clinician on day of visit: allergies, medications, problem list, medical history, surgical history, family history, social history, and previous encounter notes.  I, Water quality scientist, CMA, am acting as transcriptionist for Briscoe Deutscher, DO  I have reviewed the above documentation for accuracy and completeness, and I agree with the above. Briscoe Deutscher, DO

## 2020-09-29 ENCOUNTER — Other Ambulatory Visit: Payer: Self-pay

## 2020-09-29 ENCOUNTER — Ambulatory Visit (INDEPENDENT_AMBULATORY_CARE_PROVIDER_SITE_OTHER): Payer: Medicare Other | Admitting: Licensed Clinical Social Worker

## 2020-09-29 DIAGNOSIS — Z634 Disappearance and death of family member: Secondary | ICD-10-CM | POA: Diagnosis not present

## 2020-09-29 DIAGNOSIS — F3162 Bipolar disorder, current episode mixed, moderate: Secondary | ICD-10-CM

## 2020-09-29 DIAGNOSIS — F411 Generalized anxiety disorder: Secondary | ICD-10-CM | POA: Diagnosis not present

## 2020-09-29 NOTE — Progress Notes (Signed)
Virtual Visit via Video Note  I connected with [name] on 09/29/20 at  9:00 AM EST by a video enabled telemedicine application and verified that I am speaking with the correct person using two identifiers.  Location: Patient: home Provider: remote office [place], [place])   I discussed the limitations of evaluation and management by telemedicine and the availability of in person appointments. The patient expressed understanding and agreed to proceed.  I discussed the assessment and treatment plan with the patient. The patient was provided an opportunity to ask questions and all were answered. The patient agreed with the plan and demonstrated an understanding of the instructions.   The patient was advised to call back or seek an in-person evaluation if the symptoms worsen or if the condition fails to improve as anticipated.  I provided 45 minutes of non-face-to-face time during this encounter.   [name] R Cristina Skousen, LCSW    THERAPIST PROGRESS NOTE  Session Time: 9-9:45a  Participation Level: Active  Behavioral Response: NeatAlertAnxious and Depressed  Type of Therapy: Individual Therapy  Treatment Goals addressed: Anxiety and Coping  Interventions: CBT, Solution Focused and Supportive  Summary: [name] is a 34 y.o. female who presents with symptoms associated with bipolar disorder, anxiety, and grief. Pt reports that her mood is stable and that she is managing stress and anxiety well. Pt reports good quality and quantity of sleep.   Allowed pt to explore and express thoughts and feelings associated with recent life events: pt is excited about the land deal going through--should be closing on the land soon. Pt and husband will own deed and will move trailer there soon.   Pt currently living in camper at [facility] property. Husband and boys living in the house with MIL and pt and girls living in the camper. Pt is looking forward to having a home again together.  Pt  reports that financial strains are continuing--husband's business is not doing well and pt is worried some about that.   Pt explored thoughts and feelings associated with loss.   Pt reports that she is now taking wellbutrin for weight control--unclear right now if its working well or not. Pt is feeling good and has lost a few pounds. Reviewed healthy body image information and discussed differences between weight loss goals and overall health goals. Pt wants to feel better about self.   Allowed pt to explore family relationships--good relationship with everyone. No issues at time of session.   Encouraged pt to continue focusing on self care, positive social engagement, and overall life balance.   Suicidal/Homicidal: No  Therapist Response: [name] demonstrates a growing capacity for self acceptance, engaging in positive family relationships, and managing grief/loss. Treatment to continue as indicated.   Plan: Return again in 4 weeks.  Diagnosis: Axis I: Bipolar, mixed, Bereavement and Generalized Anxiety Disorder    Axis II: No diagnosis    [name] Cristina Eagleton, LCSW 09/29/2020

## 2020-10-09 DIAGNOSIS — E1065 Type 1 diabetes mellitus with hyperglycemia: Secondary | ICD-10-CM | POA: Diagnosis not present

## 2020-10-09 DIAGNOSIS — E1040 Type 1 diabetes mellitus with diabetic neuropathy, unspecified: Secondary | ICD-10-CM | POA: Diagnosis not present

## 2020-10-11 ENCOUNTER — Telehealth: Payer: Self-pay | Admitting: Psychiatry

## 2020-10-11 ENCOUNTER — Other Ambulatory Visit: Payer: Self-pay | Admitting: Psychiatry

## 2020-10-11 DIAGNOSIS — Z0189 Encounter for other specified special examinations: Secondary | ICD-10-CM

## 2020-10-11 DIAGNOSIS — Z79899 Other long term (current) drug therapy: Secondary | ICD-10-CM

## 2020-10-11 MED ORDER — BUPROPION HCL ER (SR) 100 MG PO TB12: ORAL_TABLET | ORAL | 0 refills | Status: DC

## 2020-10-11 NOTE — Telephone Encounter (Signed)
Pt requesting Wellbutrin SR 100 mg 2 tabs BID = 400 mg total daily. [facility] [address] [facility] on file. [address]

## 2020-10-18 ENCOUNTER — Other Ambulatory Visit: Payer: Self-pay

## 2020-10-18 ENCOUNTER — Encounter (INDEPENDENT_AMBULATORY_CARE_PROVIDER_SITE_OTHER): Payer: Self-pay | Admitting: Family Medicine

## 2020-10-18 ENCOUNTER — Ambulatory Visit (INDEPENDENT_AMBULATORY_CARE_PROVIDER_SITE_OTHER): Payer: Medicare Other | Admitting: Family Medicine

## 2020-10-18 VITALS — BP 98/64 | HR 74 | Temp 98.1°F | Ht 66.0 in | Wt 198.0 lb

## 2020-10-18 DIAGNOSIS — E559 Vitamin D deficiency, unspecified: Secondary | ICD-10-CM | POA: Diagnosis not present

## 2020-10-18 DIAGNOSIS — Z6831 Body mass index (BMI) 31.0-31.9, adult: Secondary | ICD-10-CM

## 2020-10-18 DIAGNOSIS — I10 Essential (primary) hypertension: Secondary | ICD-10-CM

## 2020-10-18 DIAGNOSIS — R632 Polyphagia: Secondary | ICD-10-CM | POA: Diagnosis not present

## 2020-10-18 DIAGNOSIS — E669 Obesity, unspecified: Secondary | ICD-10-CM | POA: Diagnosis not present

## 2020-10-18 DIAGNOSIS — E1069 Type 1 diabetes mellitus with other specified complication: Secondary | ICD-10-CM | POA: Diagnosis not present

## 2020-10-20 NOTE — Progress Notes (Signed)
Chief Complaint:   OBESITY [name] is here to discuss her progress with her obesity treatment plan along with follow-up of her obesity related diagnoses.   Today's visit was #: 14 Starting weight: 199 lbs Starting date: 11/13/2019 Today's weight: 198 lbs Today's date: 10/18/2020 Total lbs lost to date: 1 lb Body mass index is 31.96 kg/m.  Total weight loss percentage to date: -0.50%  Interim History: [name] says she is out of blood glucose sensors.  She she has sensors, her blood glucose is great, she says.  She will be calling DME today to get more.  She says she has been happy, but a little more irritable.  Nutrition Plan: keeping a food journal and adhering to recommended goals of 1500 calories and 95 grams of protein for 75% of the time.  Anti-obesity medications: Ozempic. Reported side effects: None. Hunger is well controlled. Cravings are well controlled.  Activity:  She is not exercising regularly at this time.  Assessment/Plan:   1. Vitamin D deficiency Improving, but not optimized. Current vitamin D is 32.0, tested on 06/30/2020. Optimal goal > 50 ng/dL.   Plan:  [x]   Continue Vitamin D @50 ,000 IU every week. []   Continue home supplement daily. [x]   Will check vitamin D level at next visit.  2. Essential hypertension Low today.  Medications: enalapril, Lasix. She has not taken enalapril for a few weeks (ran out).  Takes lasix about once a week on average.  Plan:  Stop enalapril due to hypotension.  She will see Renal at the end of the month.  BP Readings from Last 3 Encounters:  10/18/20 98/64  09/15/20 124/73  08/26/20 138/83   Lab Results  Component Value Date   CREATININE 1.55 (H) 07/29/2020   3. [name] [name] is taking Ozempic, Wellbutrin, and topiramate.  Plan:  Continue medications as prescribed.    4. Type 1 diabetes mellitus with other specified complication (HCC) Need FLP.  Will check labs at next visit.  Lab Results  Component Value  Date   HGBA1C 9.8 (H) 07/29/2020   HGBA1C 9.5 (H) 04/15/2020   HGBA1C 8.0 (H) 09/09/2019   Lab Results  Component Value Date   LDLCALC 68 04/22/2014   CREATININE 1.55 (H) 07/29/2020   5. Class 1 obesity with serious comorbidity and body mass index (BMI) of 32.0 to 32.9 in adult, unspecified obesity type  Course: [name] is currently in the action stage of change. As such, her goal is to continue with weight loss efforts.   Nutrition goals: She has agreed to keeping a food journal and adhering to recommended goals of 1500 calories and 95 grams of protein.   Exercise goals: Increase walking.  Behavioral modification strategies: increasing lean protein intake, decreasing simple carbohydrates, increasing vegetables, increasing water intake, decreasing liquid calories, decreasing sodium intake and increasing high fiber foods.  [name] has agreed to follow-up with our clinic in 3 weeks. She was informed of the importance of frequent follow-up visits to maximize her success with intensive lifestyle modifications for her multiple health conditions.   Objective:   Blood pressure 98/64, pulse 74, temperature 98.1 F (36.7 C), temperature source Oral, height 5\' 6"  (1.676 m), weight 198 lb (89.8 kg), SpO2 99 %. Body mass index is 31.96 kg/m.  General: Cooperative, alert, well developed, in no acute distress. HEENT: Conjunctivae and lids unremarkable. Cardiovascular: Regular rhythm.  Lungs: Normal work of breathing. Neurologic: No focal deficits.   Lab Results  Component Value Date   CREATININE  1.55 (H) 07/29/2020   BUN 16 07/29/2020   NA 140 07/29/2020   K 5.1 07/29/2020   CL 102 07/29/2020   CO2 25 07/29/2020   Lab Results  Component Value Date   ALT 36 (H) 07/29/2020   AST 24 07/29/2020   ALKPHOS 209 (H) 07/29/2020   BILITOT 0.3 07/29/2020   Lab Results  Component Value Date   HGBA1C 9.8 (H) 07/29/2020   HGBA1C 9.5 (H) 04/15/2020   HGBA1C 8.0 (H) 09/09/2019   HGBA1C 9.0  04/05/2017   HGBA1C 11.2 (H) 11/24/2015   Lab Results  Component Value Date   TSH 1.530 04/15/2020   Lab Results  Component Value Date   CHOL 149 04/22/2014   HDL 71 04/22/2014   LDLCALC 68 04/22/2014   TRIG 49 04/22/2014   CHOLHDL 2.1 04/22/2014   Lab Results  Component Value Date   WBC 6.1 04/15/2020   HGB 13.2 04/15/2020   HCT 39.7 04/15/2020   MCV 89 04/15/2020   PLT 256 04/15/2020   Lab Results  Component Value Date   IRON 41 11/13/2019   TIBC 267 11/13/2019   FERRITIN 77 11/13/2019   Obesity Behavioral Intervention:   Approximately 15 minutes were spent on the discussion below.  ASK: We discussed the diagnosis of obesity with [name] today and [name] agreed to give [name] permission to discuss obesity behavioral modification therapy today.  ASSESS: [name] has the diagnosis of obesity and her BMI today is 32.1. [name] is in the action stage of change.   ADVISE: [name] was educated on the multiple health risks of obesity as well as the benefit of weight loss to improve her health. She was advised of the need for long term treatment and the importance of lifestyle modifications to improve her current health and to decrease her risk of future health problems.  AGREE: Multiple dietary modification options and treatment options were discussed and [name] agreed to follow the recommendations documented in the above note.  ARRANGE: [name] was educated on the importance of frequent visits to treat obesity as outlined per CMS and USPSTF guidelines and agreed to schedule her next follow up appointment today.  Attestation Statements:   Reviewed by clinician on day of visit: allergies, medications, problem list, medical history, surgical history, family history, social history, and previous encounter notes.  I, Water quality scientist, CMA, am acting as transcriptionist for Briscoe Deutscher, DO  I have reviewed the above documentation for accuracy and completeness, and I agree with  the above. Briscoe Deutscher, DO

## 2020-10-21 MED ORDER — VITAMIN D (ERGOCALCIFEROL) 1.25 MG (50000 UNIT) PO CAPS: 50000.0000 [IU] | ORAL_CAPSULE | ORAL | 0 refills | Status: DC

## 2020-10-26 ENCOUNTER — Other Ambulatory Visit: Payer: Self-pay

## 2020-10-26 ENCOUNTER — Other Ambulatory Visit: Payer: Self-pay | Admitting: Psychiatry

## 2020-10-26 ENCOUNTER — Encounter (INDEPENDENT_AMBULATORY_CARE_PROVIDER_SITE_OTHER): Payer: Self-pay | Admitting: Family Medicine

## 2020-10-26 ENCOUNTER — Ambulatory Visit (INDEPENDENT_AMBULATORY_CARE_PROVIDER_SITE_OTHER): Payer: Medicare Other | Admitting: Licensed Clinical Social Worker

## 2020-10-26 ENCOUNTER — Telehealth: Payer: Self-pay | Admitting: Psychiatry

## 2020-10-26 DIAGNOSIS — Z0189 Encounter for other specified special examinations: Secondary | ICD-10-CM

## 2020-10-26 DIAGNOSIS — F3162 Bipolar disorder, current episode mixed, moderate: Secondary | ICD-10-CM | POA: Diagnosis not present

## 2020-10-26 DIAGNOSIS — F411 Generalized anxiety disorder: Secondary | ICD-10-CM | POA: Diagnosis not present

## 2020-10-26 MED ORDER — TOPIRAMATE 25 MG PO TABS: 75.0000 mg | ORAL_TABLET | Freq: Two times a day (BID) | ORAL | 1 refills | Status: DC

## 2020-10-26 NOTE — Telephone Encounter (Signed)
Let her know I sent in prescription to increase the Topamax to 3 tablets twice daily.

## 2020-10-26 NOTE — Progress Notes (Signed)
Virtual Visit via Video Note  I connected with [name] on 10/26/20 at 10:00 AM EST by a video enabled telemedicine application and verified that I am speaking with the correct person using two identifiers.  Location: Patient: home Provider: remote office [place], [place])   I discussed the limitations of evaluation and management by telemedicine and the availability of in person appointments. The patient expressed understanding and agreed to proceed.  I discussed the assessment and treatment plan with the patient. The patient was provided an opportunity to ask questions and all were answered. The patient agreed with the plan and demonstrated an understanding of the instructions.   The patient was advised to call back or seek an in-person evaluation if the symptoms worsen or if the condition fails to improve as anticipated.  I provided 45 minutes of non-face-to-face time during this encounter.   Bernyce Brimley R Aveer Bartow, LCSW    THERAPIST PROGRESS NOTE  Session Time: 10-10:45a  Participation Level: Active  Behavioral Response: Neat and Well GroomedDrowsypleasant  Type of Therapy: Individual Therapy  Treatment Goals addressed: Coping  Interventions: Solution Focused and Supportive  Summary: [name] is a 35 y.o. female who presents with improving symptoms related to bipolar disorder diagnosis. Pt reports that her overall mood has been stable and that she is getting good quality and quantity of sleep.  Allowed pt to explore and express thoughts and feelings related to family--pt had good holiday season with family members. Finalized process with deed/land--so family should be able to start repairs on the home that is currently on the land. Pt is unaware of the extent of work that the home will need--she has not seen it on the inside.   Discussed recent beach trip from mom--part of holiday season. Pt enjoyed spending quality time with family while at the beach.   Pts  parents are going to [facility] for a revival at the end of January--pt is looking forward to going. Main pastor was one that used to travel with pts father--pt is looking forward to spending time with him to remind her of the past.   Discussed self care and pt feels that she is trying harder to put her needs as a priority. Pt admits that it is hard with her current living situation but is aware that things are going to get better once they move.  Continued to recommend focusing on self care, positive social interactions, focus on overall health and wellness, and life balance.    Suicidal/Homicidal: No  Therapist Response: Miaya is continuing to make good progress with overall self understanding, self management, and ability to plan positive recreational events with friends and family. Maribeth is continuing to set financial goals for future and identifying steps that she needs to take to achieve goals.   Plan: Return again in 4 weeks.  Diagnosis: Axis I: Bipolar, Depressed and Generalized Anxiety Disorder    Axis II: No diagnosis    [name] Elton Catalano, LCSW 10/26/2020

## 2020-10-26 NOTE — Telephone Encounter (Signed)
Pt called requesting increase for Topamax. Last couple weeks increase in appetite. Contact # [phone]. [address] 2[address]14

## 2020-10-27 NOTE — Telephone Encounter (Signed)
Pt was called and left message on v-mail to increase Topamax to 3 tablets twice daily. [facility] Call with any questions.

## 2020-10-28 ENCOUNTER — Telehealth (INDEPENDENT_AMBULATORY_CARE_PROVIDER_SITE_OTHER): Payer: Medicare Other | Admitting: Nurse Practitioner

## 2020-10-28 ENCOUNTER — Encounter: Payer: Self-pay | Admitting: Nurse Practitioner

## 2020-10-28 ENCOUNTER — Other Ambulatory Visit: Payer: Self-pay

## 2020-10-28 VITALS — BP 143/90 | HR 107 | Wt 197.0 lb

## 2020-10-28 DIAGNOSIS — J4521 Mild intermittent asthma with (acute) exacerbation: Secondary | ICD-10-CM | POA: Diagnosis not present

## 2020-10-28 DIAGNOSIS — J0110 Acute frontal sinusitis, unspecified: Secondary | ICD-10-CM | POA: Insufficient documentation

## 2020-10-28 MED ORDER — MONTELUKAST SODIUM 10 MG PO TABS: 10.0000 mg | ORAL_TABLET | Freq: Every day | ORAL | 1 refills | Status: DC

## 2020-10-28 MED ORDER — GUAIFENESIN ER 600 MG PO TB12: 600.0000 mg | ORAL_TABLET | Freq: Two times a day (BID) | ORAL | 0 refills | Status: DC | PRN

## 2020-10-28 MED ORDER — AMOXICILLIN-POT CLAVULANATE 875-125 MG PO TABS: 1.0000 | ORAL_TABLET | Freq: Two times a day (BID) | ORAL | 0 refills | Status: AC

## 2020-10-28 MED ORDER — BENZONATATE 100 MG PO CAPS: 100.0000 mg | ORAL_CAPSULE | Freq: Three times a day (TID) | ORAL | 0 refills | Status: DC | PRN

## 2020-10-28 MED ORDER — PREDNISONE 10 MG PO TABS
10.0000 mg | ORAL_TABLET | Freq: Every day | ORAL | 0 refills | Status: DC
Start: 2020-10-28 — End: 2020-12-16

## 2020-10-28 NOTE — Assessment & Plan Note (Signed)
Acute, ongoing.  Likely with viral etiology; encouraged patient to obtain COVID-19 testing or isolate at home until time since symptom onset >10 days, no fever for 48 hours, and symptoms are improving.  Will start Augmentin, prednisone taper, tessalon for cough, and mucinex to help loosen secretions.  Follow up Monday if symptoms do not improve.  With any sudden onset new chest pain, dizziness, sweating, or shortness of breath, go to ED.

## 2020-10-28 NOTE — Patient Instructions (Signed)
Asthma Action Plan, Adult An asthma action plan helps you understand how to manage your asthma and what to do when you have an asthma attack. The action plan is a color-coded plan that lists the symptoms that indicate whether or not your condition is under control and what actions to take.  If you have symptoms in the green zone, it means you are doing well.  If you have symptoms in the yellow zone, it means you are having problems.  If you have symptoms in the red zone, you need medical care right away. Follow the plan that you and your health care provider develop. Review your plan with your health care provider at each visit. What triggers your asthma? Knowing the things that can trigger an asthma attack or make your asthma symptoms worse is very important. Talk to your health care provider about your asthma triggers and how to avoid them. Record your known asthma triggers here: _______________ What is your personal best peak flow reading? If you use a peak flow meter, determine your personal best reading. Record it here: _______________ Red zone Symptoms in this zone mean that you should get medical help right away. You will likely feel distressed and have symptoms at rest that restrict your activity. You are in the red zone if:  You are breathing hard and quickly.  Your nose opens wide, your ribs show, and your neck muscles become visible when you breathe in.  Your lips, fingers, or toes are a bluish color.  You have trouble speaking in full sentences.  Your peak flow reading is less than __________ (less than 50% of your personal best).  Your symptoms do not improve within 15-20 minutes after you use your reliever or rescue medicine (bronchodilator). If you have any of these symptoms:  Call your local emergency services (911 in the [place] or go to the nearest emergency room.  Use your reliever or rescue medicine. ? Start a nebulizer treatment or take 2-4 puffs from a metered-dose  inhaler with a spacer. ? Repeat this action every 15-20 minutes until help arrives.   Yellow zone Symptoms in this zone mean that your condition may be getting worse. You may have symptoms that interfere with exercise, are noticeably worse after exposure to triggers, or are worse at the first sign of a cold (upper respiratory infection). These may include:  Waking from sleep.  Coughing, especially at night or first thing in the morning.  Mild wheezing.  Chest tightness.  A peak flow reading that is __________ to __________ (50-79% of your personal best). If you have any of these symptoms:  Add the following medicine to the ones that you use daily: ? Reliever or rescue medicine and dosage: _______________ ? Additional medicine and dosage: _______________ Call your health care provider if:  You remain in the yellow zone for __________ hours.  You are using a reliever or rescue medicine more than 2-3 times a week.   Green zone This zone means that your asthma is under control. You may not have any symptoms while you are in the green zone. This means that you:  Have no coughing or wheezing, even while you are working or playing.  Sleep through the night.  Are breathing well.  Have a peak flow reading that is above __________ (80% of your personal best or greater). If you are in the green zone, continue to manage your asthma as directed:  Take these medicines every day: ? Controller medicine and dosage: _______________ ?   Controller medicine and dosage: _______________ ? Controller medicine and dosage: _______________ ? Controller medicine and dosage: _______________  Before exercise, use this reliever or rescue medicine: _______________ Call your health care provider if you are using a reliever or rescue medicine more than 2-3 times a week.   Where to find more information You can find more information about asthma from:  Centers for Disease Control and Prevention:  www.cdc.gov/asthma  American Lung Association: www.lung.org This information is not intended to replace advice given to you by your health care provider. Make sure you discuss any questions you have with your health care provider. Document Revised: 01/20/2020 Document Reviewed: 01/20/2020 Elsevier Patient Education  2021 Elsevier Inc.  

## 2020-10-28 NOTE — Progress Notes (Signed)
Subjective:    Patient ID: [name], female    DOB: 09-18-1986, 35 y.o.   MRN: [id number]  HPI: [name] is a 35 y.o. female presenting virtually for sinus infection.  Chief Complaint  Patient presents with  . Cough    Dry cough  . Nasal Congestion    Wheezing, up during the night with cough+ wheeze even after using her inhaler. Thinks extended family may have had covid but unsure. Spouse also not feeling well. Mother had Covid week before last.    UPPER RESPIRATORY TRACT INFECTION About 1.5 weeks ago, was exposed to COVID-19 at a birthday party. Onset: ~1 week Worst symptom: nasal congestion and cough this week Fever: no Cough: yes; dry Shortness of breath: yes mostly with activity and at nighttime. Wheezing: yes; has been using albuterol inhaler >6 times per night Chest pain: no Chest tightness: yes; worse at night time Chest congestion: no Nasal congestion: yes Runny nose: no Post nasal drip: yes Sneezing: yes Sore throat: yes Swollen glands: no Sinus pressure: no Headache: yes; similar to regular migraines Face pain: no Toothache: no Ear pain: no  Ear pressure: no  Eyes red/itching:no Eye drainage/crusting: no  Nausea: no  Vomiting: no Diarrhea: yes  Change in appetite: no  Loss of taste/smell: no  Rash: no Fatigue: yes  Body aches: yes Sick contacts: yes Strep contacts: no  Context: worse Recurrent sinusitis: no Treatments attempted: albuterol inhaler; zinc, multivitamins, tylenol cold/flu Relief with OTC medications: no  Allergies  Allergen Reactions  . Toradol [Ketorolac Tromethamine]     Cannot take d/t kidney issues   . Ceftin Rash    Outpatient Encounter Medications as of 10/28/2020  Medication Sig Note  . albuterol (PROVENTIL) (2.5 MG/3ML) 0.083% nebulizer solution Take 3 mLs (2.5 mg total) by nebulization every 6 (six) hours as needed for wheezing or shortness of breath.   Marland Kitchen albuterol (VENTOLIN HFA) 108 (90 Base) MCG/ACT  inhaler Inhale 2 puffs into the lungs every 6 (six) hours as needed for wheezing or shortness of breath.   Marland Kitchen amoxicillin-clavulanate (AUGMENTIN) 875-125 MG tablet Take 1 tablet by mouth 2 (two) times daily for 7 days.   . benzonatate (TESSALON) 100 MG capsule Take 1 capsule (100 mg total) by mouth 3 (three) times daily as needed for cough.   . Botulinum Toxin Type A (BOTOX) 200 units SOLR Inject 155 Units as directed every 3 (three) months.    Marland Kitchen buPROPion (WELLBUTRIN SR) 200 MG 12 hr tablet Take 1 tablet (200 mg total) by mouth 2 (two) times daily.   . clonazePAM (KLONOPIN) 0.5 MG tablet Take 1 tablet (0.5 mg total) by mouth every 8 (eight) hours as needed. for anxiety   . enalapril (VASOTEC) 20 MG tablet Take 20 mg by mouth daily.    . fluticasone (FLONASE) 50 MCG/ACT nasal spray Place 2 sprays into both nostrils daily. (Patient taking differently: Place 2 sprays into both nostrils daily as needed (seasonal allergies.).)   . furosemide (LASIX) 40 MG tablet Take 40 mg by mouth every other day.    . gabapentin (NEURONTIN) 100 MG capsule 2 capsules at 6 PM and 2 capsules at 9 PM   . GLUCAGON EMERGENCY 1 MG injection Inject 1 mg into the skin as needed (hypoglycemia.).    Marland Kitchen guaiFENesin (MUCINEX) 600 MG 12 hr tablet Take 1 tablet (600 mg total) by mouth 2 (two) times daily as needed for cough or to loosen phlegm.   . Insulin Human (INSULIN  PUMP) SOLN Inject 1 each into the skin 3 times daily with meals, bedtime and 2 AM. Novolog: 36-38 units per day   . Insulin Pen Needle 32G X 4 MM MISC 1 each by Does not apply route once a week.   Marland Kitchen levonorgestrel (MIRENA, 52 MG,) 20 MCG/24HR IUD 1 each by Intrauterine route once.   . metoCLOPramide (REGLAN) 5 MG tablet TAKE 1 TABLET (5 MG TOTAL) BY MOUTH 3 (THREE) TIMES DAILY BEFORE MEALS.   . mometasone-formoterol (DULERA) 100-5 MCG/ACT AERO Inhale 2 puffs into the lungs 2 (two) times daily.   Marland Kitchen NOVOLOG 100 UNIT/ML injection Inject 36-38 Units into the skin daily.  Via insulin pump per sliding scale 10/16/2019: Took 5 units SQ  . polyethylene glycol (MIRALAX / GLYCOLAX) 17 g packet Take 17 g by mouth 2 (two) times daily as needed (constipation).   . predniSONE (DELTASONE) 10 MG tablet Take 1 tablet (10 mg total) by mouth daily with breakfast. Take 40mg  on days 1-2. Take 30mg  on days 3-4. Take 20mg  on days 5-6. Take 10mg  on days 7-8. Take 5mg  on days 9-10, then stop.   . promethazine (PHENERGAN) 25 MG tablet Take 1 tablet (25 mg total) by mouth every 8 (eight) hours as needed for nausea or vomiting.   . Prucalopride Succinate (MOTEGRITY) 2 MG TABS Take 1 tablet (2 mg total) by mouth every morning.   Marland Kitchen QUEtiapine (SEROQUEL XR) 300 MG 24 hr tablet Take 2 tablets (600 mg total) by mouth at bedtime.   . Semaglutide, 1 MG/DOSE, (OZEMPIC, 1 MG/DOSE,) 4 MG/3ML SOPN Inject 1 mg into the skin once a week.   . SUMAtriptan Succinate 3 MG/0.5ML SOAJ Inject 0.5 mLs into the skin See admin instructions. To use as needed for migraines   . tiZANidine (ZANAFLEX) 4 MG tablet Take 4 mg by mouth 2 (two) times daily as needed for muscle spasms.    Marland Kitchen topiramate (TOPAMAX) 25 MG tablet Take 3 tablets (75 mg total) by mouth 2 (two) times daily.   Marland Kitchen Ubrogepant (UBRELVY) 100 MG TABS Take 100 mg by mouth daily as needed (migraine headaches.).    Marland Kitchen Vitamin D, Ergocalciferol, (DRISDOL) 1.25 MG (50000 UNIT) CAPS capsule Take 1 capsule (50,000 Units total) by mouth every 7 (seven) days.   . [DISCONTINUED] montelukast (SINGULAIR) 10 MG tablet Take 1 tablet (10 mg total) by mouth at bedtime.   . montelukast (SINGULAIR) 10 MG tablet Take 1 tablet (10 mg total) by mouth at bedtime.    No facility-administered encounter medications on file as of 10/28/2020.    Patient Active Problem List   Diagnosis Date Noted  . Mild intermittent asthma with exacerbation 10/28/2020  . Acute non-recurrent frontal sinusitis 10/28/2020  . 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 Polyphagia 08/27/2020  . Bipolar 1 disorder, mixed, severe ([place]) 08/27/2020  . Type 1 diabetes mellitus with hyperglycemia ([place]) 08/27/2020  . Financial difficulties 08/27/2020  . Drug-induced weight gain 08/27/2020  . Traction detachment of right retina 08/27/2020  . Vitreous hemorrhage of left eye ([place]) 08/27/2020  . Hypertension associated with diabetes ([place]) 08/27/2020  . Type 1 diabetes mellitus with diabetic polyneuropathy 08/27/2020  . Class 1 obesity with serious comorbidity and body mass index (BMI) of 33.0 to 33.9 in adult 08/27/2020  . Status post eye surgery 07/01/2020  . Chronic migraine without aura 01/22/2020  . S/P eye surgery 08/29/2019  . Diplopia 07/23/2019  . Epiretinal membrane (ERM) of  left eye 07/23/2019  . 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  . 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 Pseudophakia of left eye 11/04/2018  . Diabetic nephropathy associated with type 1 diabetes mellitus ([place]) 03/01/2016  . Drug overdose, intentional ([place]) 03/27/2014  . Asthma 11/10/2013  . MDD (major depressive disorder), recurrent episode, severe ([place]) 09/26/2013  . Generalized anxiety disorder 09/26/2013  . Aphakia, right eye 09/14/2012  . Retinal detachment, tractional, left eye 06/11/2012  . Eczema 06/07/2012  . Diabetic retinopathy associated with type 1 diabetes mellitus ([place]) 04/30/2012  . H/O insertion of insulin pump 04/30/2012  . History of atrial tachycardia 08/08/2010  . Gastroparesis due to DM ([place]) 08/08/2010    Past Medical History:  Diagnosis Date  . Anemia   . Anemia of chronic disease 06/07/2012   Formatting of this note might be different from the original. Hematology consultation 07-13-16 (see note)--> Anemia of chronic disease Required 3 transfusions during pregnancy  Last Assessment & Plan:  Formatting of this note might be different from the original. She has now required 3 blood transfusions this pregnancy for her anemia of chronic  disease. The most recent was one week ago when she bec  . Anxiety   . Asthma   . Asthma 11/10/2013  . Back pain   . Bipolar 1 disorder ([place])   . Chest pain   . Chronic renal failure syndrome, stage 3 (moderate) ([facility])   . Colles' fracture of left radius 10/11/2013  . Complication of anesthesia   . Constipation   . Depression   . Depression   . Diabetic gastroparesis ([place])   . Diabetic neuropathy, type I diabetes mellitus ([place])   . Diabetic ulcer of right great toe ([place]) 08/27/2020  . Edema, lower extremity   . Gallbladder disease   . Gastroparesis   . GERD (gastroesophageal reflux disease)   . Headache(784.0)   . Hypertension   . Joint pain   . Kidney stones   . Palpitations   . Polyneuropathy in diabetes(357.2)   . PONV (postoperative nausea and vomiting)   . 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[place])   . S/P carpal tunnel release left 10/16/19 11/04/2019  . Shortness of breath   . Tachycardia    baseline tachycardia   . 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)    Relevant past medical, surgical, family and social history reviewed and updated as indicated. Interim medical history since our last visit reviewed.  Review of Systems See HPI for pertinent positives and negative.     Objective:    BP (!) 143/90 Comment: all vitals per patient  Pulse (!) 107   Wt 197 lb (89.4 kg) Comment: per patient  SpO2 98%   BMI 31.80 kg/m   Wt Readings from Last 3 Encounters:  10/28/20 197 lb (89.4 kg)  10/18/20 198 lb (89.8 kg)  09/15/20 206 lb (93.4 kg)    Physical Exam Physical examination unable to be performed due to lack of equipment.  Patient talking in complete sentences with occasional cough during telemedicine visit.     Assessment & Plan:   Problem List Items Addressed This Visit      Respiratory   Mild intermittent asthma with exacerbation - Primary    Acute, ongoing.  Concern for exacerbation given wheezing despite use of  albuterol inhaler.  Will start prednisone taper and encouraged patient to use nebulizer.  Refill given for Singulair.  Follow up by Monday with no improvement.  Relevant Medications   montelukast (SINGULAIR) 10 MG tablet   predniSONE (DELTASONE) 10 MG tablet   Acute non-recurrent frontal sinusitis    Acute, ongoing.  Likely with viral etiology; encouraged patient to obtain COVID-19 testing or isolate at home until time since symptom onset >10 days, no fever for 48 hours, and symptoms are improving.  Will start Augmentin, prednisone taper, tessalon for cough, and mucinex to help loosen secretions.  Follow up Monday if symptoms do not improve.  With any sudden onset new chest pain, dizziness, sweating, or shortness of breath, go to ED.       Relevant Medications   benzonatate (TESSALON) 100 MG capsule   guaiFENesin (MUCINEX) 600 MG 12 hr tablet   predniSONE (DELTASONE) 10 MG tablet   amoxicillin-clavulanate (AUGMENTIN) 875-125 MG tablet       Follow up plan: Return if symptoms worsen or fail to improve.  This visit was completed via telephone due to the restrictions of the COVID-19 pandemic. All issues as above were discussed and addressed but no physical exam was performed. If it was felt that the patient should be evaluated in the office, they were directed there. The patient verbally consented to this visit. Patient was unable to complete an audio/visual visit due to Lack of equipment. . Location of the patient: home . Location of the provider: work . Those involved with this call:  . Provider: Carnella Guadalaj[place], DNP . CMA: Abby Wingate, CMA . Front Desk/Registration: Jeralene Peters  . Time spent on call: 15 minutes on the phone discussing health concerns. 30 minutes total spent in review of patient's record and preparation of their chart.  I verified patient identity using two factors (patient name and date of birth). Patient consents verbally to being seen via telemedicine visit  today.

## 2020-10-28 NOTE — Assessment & Plan Note (Signed)
Acute, ongoing.  Concern for exacerbation given wheezing despite use of albuterol inhaler.  Will start prednisone taper and encouraged patient to use nebulizer.  Refill given for Singulair.  Follow up by Monday with no improvement.

## 2020-11-05 DIAGNOSIS — E1040 Type 1 diabetes mellitus with diabetic neuropathy, unspecified: Secondary | ICD-10-CM | POA: Diagnosis not present

## 2020-11-05 DIAGNOSIS — E1065 Type 1 diabetes mellitus with hyperglycemia: Secondary | ICD-10-CM | POA: Diagnosis not present

## 2020-11-08 ENCOUNTER — Other Ambulatory Visit: Payer: Self-pay | Admitting: Family Medicine

## 2020-11-08 DIAGNOSIS — Z794 Long term (current) use of insulin: Secondary | ICD-10-CM | POA: Diagnosis not present

## 2020-11-08 DIAGNOSIS — I959 Hypotension, unspecified: Secondary | ICD-10-CM | POA: Diagnosis not present

## 2020-11-08 DIAGNOSIS — E1040 Type 1 diabetes mellitus with diabetic neuropathy, unspecified: Secondary | ICD-10-CM | POA: Diagnosis not present

## 2020-11-08 DIAGNOSIS — E1022 Type 1 diabetes mellitus with diabetic chronic kidney disease: Secondary | ICD-10-CM | POA: Diagnosis not present

## 2020-11-08 DIAGNOSIS — R748 Abnormal levels of other serum enzymes: Secondary | ICD-10-CM | POA: Diagnosis not present

## 2020-11-08 DIAGNOSIS — J45901 Unspecified asthma with (acute) exacerbation: Secondary | ICD-10-CM

## 2020-11-08 DIAGNOSIS — R000 Tachycardia, unspecified: Secondary | ICD-10-CM | POA: Diagnosis not present

## 2020-11-08 DIAGNOSIS — Z8249 Family history of ischemic heart disease and other diseases of the circulatory system: Secondary | ICD-10-CM | POA: Diagnosis not present

## 2020-11-08 DIAGNOSIS — I10 Essential (primary) hypertension: Secondary | ICD-10-CM | POA: Diagnosis not present

## 2020-11-08 DIAGNOSIS — N1832 Chronic kidney disease, stage 3b: Secondary | ICD-10-CM | POA: Diagnosis not present

## 2020-11-08 DIAGNOSIS — E1021 Type 1 diabetes mellitus with diabetic nephropathy: Secondary | ICD-10-CM | POA: Diagnosis not present

## 2020-11-15 ENCOUNTER — Ambulatory Visit (INDEPENDENT_AMBULATORY_CARE_PROVIDER_SITE_OTHER): Payer: Medicare Other | Admitting: Family Medicine

## 2020-11-15 DIAGNOSIS — E1140 Type 2 diabetes mellitus with diabetic neuropathy, unspecified: Secondary | ICD-10-CM | POA: Insufficient documentation

## 2020-11-15 DIAGNOSIS — E1122 Type 2 diabetes mellitus with diabetic chronic kidney disease: Secondary | ICD-10-CM | POA: Insufficient documentation

## 2020-11-15 DIAGNOSIS — N183 Chronic kidney disease, stage 3 unspecified: Secondary | ICD-10-CM | POA: Insufficient documentation

## 2020-11-15 DIAGNOSIS — E119 Type 2 diabetes mellitus without complications: Secondary | ICD-10-CM | POA: Diagnosis not present

## 2020-11-15 DIAGNOSIS — E669 Obesity, unspecified: Secondary | ICD-10-CM | POA: Diagnosis not present

## 2020-11-15 DIAGNOSIS — E1021 Type 1 diabetes mellitus with diabetic nephropathy: Secondary | ICD-10-CM | POA: Diagnosis not present

## 2020-11-15 DIAGNOSIS — F419 Anxiety disorder, unspecified: Secondary | ICD-10-CM | POA: Diagnosis not present

## 2020-11-15 DIAGNOSIS — Z20822 Contact with and (suspected) exposure to covid-19: Secondary | ICD-10-CM | POA: Diagnosis not present

## 2020-11-15 DIAGNOSIS — F32A Depression, unspecified: Secondary | ICD-10-CM | POA: Diagnosis not present

## 2020-11-15 DIAGNOSIS — K3184 Gastroparesis: Secondary | ICD-10-CM | POA: Diagnosis not present

## 2020-11-15 DIAGNOSIS — E1042 Type 1 diabetes mellitus with diabetic polyneuropathy: Secondary | ICD-10-CM | POA: Diagnosis not present

## 2020-11-15 DIAGNOSIS — R072 Precordial pain: Secondary | ICD-10-CM | POA: Diagnosis not present

## 2020-11-15 DIAGNOSIS — G43909 Migraine, unspecified, not intractable, without status migrainosus: Secondary | ICD-10-CM | POA: Insufficient documentation

## 2020-11-15 DIAGNOSIS — R079 Chest pain, unspecified: Secondary | ICD-10-CM | POA: Diagnosis not present

## 2020-11-15 DIAGNOSIS — N1832 Chronic kidney disease, stage 3b: Secondary | ICD-10-CM | POA: Diagnosis not present

## 2020-11-15 DIAGNOSIS — J45909 Unspecified asthma, uncomplicated: Secondary | ICD-10-CM | POA: Diagnosis not present

## 2020-11-15 DIAGNOSIS — I10 Essential (primary) hypertension: Secondary | ICD-10-CM | POA: Diagnosis not present

## 2020-11-16 ENCOUNTER — Other Ambulatory Visit: Payer: Self-pay

## 2020-11-16 ENCOUNTER — Ambulatory Visit: Payer: Medicare Other | Admitting: Licensed Clinical Social Worker

## 2020-11-16 DIAGNOSIS — I517 Cardiomegaly: Secondary | ICD-10-CM | POA: Diagnosis not present

## 2020-11-16 DIAGNOSIS — R072 Precordial pain: Secondary | ICD-10-CM | POA: Diagnosis not present

## 2020-11-16 DIAGNOSIS — R079 Chest pain, unspecified: Secondary | ICD-10-CM | POA: Diagnosis not present

## 2020-11-16 NOTE — Progress Notes (Signed)
LCSW counselor tried to connect with patient for scheduled appointment via MyChart video text request and disclosed that she was currently in the hospital for assessment of chest pain.  Pt will call back to [facility] OPT appt when she is back home from this work up.  Jeanmarie Plant, MSW, LCSW Outpatient Therapist/Triage Specialist

## 2020-11-17 DIAGNOSIS — R079 Chest pain, unspecified: Secondary | ICD-10-CM | POA: Diagnosis not present

## 2020-11-17 DIAGNOSIS — I2510 Atherosclerotic heart disease of native coronary artery without angina pectoris: Secondary | ICD-10-CM | POA: Diagnosis not present

## 2020-11-19 ENCOUNTER — Other Ambulatory Visit: Payer: Self-pay | Admitting: Internal Medicine

## 2020-11-19 DIAGNOSIS — R748 Abnormal levels of other serum enzymes: Secondary | ICD-10-CM

## 2020-11-20 ENCOUNTER — Other Ambulatory Visit (INDEPENDENT_AMBULATORY_CARE_PROVIDER_SITE_OTHER): Payer: Self-pay | Admitting: Family Medicine

## 2020-11-20 DIAGNOSIS — E559 Vitamin D deficiency, unspecified: Secondary | ICD-10-CM

## 2020-11-22 ENCOUNTER — Ambulatory Visit
Admission: RE | Admit: 2020-11-22 | Discharge: 2020-11-22 | Disposition: A | Discharge status: Home / Self Care | Payer: Medicare Other | Source: Ambulatory Visit | Attending: Internal Medicine | Admitting: Internal Medicine

## 2020-11-22 ENCOUNTER — Telehealth: Payer: Self-pay

## 2020-11-22 ENCOUNTER — Ambulatory Visit: Payer: Medicare Other | Admitting: Psychiatry

## 2020-11-22 DIAGNOSIS — M25552 Pain in left hip: Secondary | ICD-10-CM | POA: Diagnosis not present

## 2020-11-22 DIAGNOSIS — R748 Abnormal levels of other serum enzymes: Secondary | ICD-10-CM

## 2020-11-22 NOTE — Telephone Encounter (Signed)
Transition Care Management Follow-up Telephone Call  Date of discharge and from where: 11/21/20 from [facility]  How have you been since you were released from the hospital? Pt states that she is doing well and has no concerns at this time.   Any questions or concerns? No  Items Reviewed:  Did the pt receive and understand the discharge instructions provided? Yes   Medications obtained and verified? Yes   Other? No   Any new allergies since your discharge? No   Dietary orders reviewed? Heart Healthy  Do you have support at home? Yes   Functional Questionnaire: (I = Independent and D = Dependent) ADLs: I  Bathing/Dressing- I  Meal Prep- I  Eating- I  Maintaining continence- I  Transferring/Ambulation- I  Managing Meds- I   Follow up appointments reviewed:   [facility] f/u appt confirmed? No  Pt is calling cardiologist to make an appointment.  Are transportation arrangements needed? No  If their condition worsens, is the pt aware to call PCP or go to the Emergency Dept.? Yes Was the patient provided with contact information for the PCP's office or ED? Yes Was to pt encouraged to call back with questions or concerns? Yes

## 2020-11-22 NOTE — Telephone Encounter (Signed)
Last OV with Dr Wallace 

## 2020-11-23 ENCOUNTER — Encounter (INDEPENDENT_AMBULATORY_CARE_PROVIDER_SITE_OTHER): Payer: Self-pay

## 2020-11-23 NOTE — Telephone Encounter (Signed)
Message sent to pt.

## 2020-11-25 ENCOUNTER — Other Ambulatory Visit: Payer: Self-pay | Admitting: Psychiatry

## 2020-11-25 DIAGNOSIS — Z0189 Encounter for other specified special examinations: Secondary | ICD-10-CM

## 2020-11-25 DIAGNOSIS — Z79899 Other long term (current) drug therapy: Secondary | ICD-10-CM

## 2020-11-26 ENCOUNTER — Other Ambulatory Visit: Payer: Self-pay | Admitting: Psychiatry

## 2020-11-26 DIAGNOSIS — Z79899 Other long term (current) drug therapy: Secondary | ICD-10-CM

## 2020-11-26 DIAGNOSIS — Z0189 Encounter for other specified special examinations: Secondary | ICD-10-CM

## 2020-11-26 NOTE — Telephone Encounter (Signed)
Call to [facility]

## 2020-11-26 NOTE — Telephone Encounter (Signed)
Send

## 2020-12-16 ENCOUNTER — Other Ambulatory Visit: Payer: Self-pay

## 2020-12-16 ENCOUNTER — Ambulatory Visit (INDEPENDENT_AMBULATORY_CARE_PROVIDER_SITE_OTHER): Payer: Medicare Other | Admitting: Family Medicine

## 2020-12-16 ENCOUNTER — Telehealth: Payer: Self-pay | Admitting: Psychiatry

## 2020-12-16 ENCOUNTER — Encounter (INDEPENDENT_AMBULATORY_CARE_PROVIDER_SITE_OTHER): Payer: Self-pay | Admitting: Family Medicine

## 2020-12-16 VITALS — BP 116/78 | HR 99 | Temp 98.0°F | Ht 66.0 in | Wt 198.0 lb

## 2020-12-16 DIAGNOSIS — K3184 Gastroparesis: Secondary | ICD-10-CM

## 2020-12-16 DIAGNOSIS — Z9114 Patient's other noncompliance with medication regimen: Secondary | ICD-10-CM

## 2020-12-16 DIAGNOSIS — R072 Precordial pain: Secondary | ICD-10-CM | POA: Diagnosis not present

## 2020-12-16 DIAGNOSIS — Z91148 Patient's other noncompliance with medication regimen for other reason: Secondary | ICD-10-CM

## 2020-12-16 DIAGNOSIS — E1021 Type 1 diabetes mellitus with diabetic nephropathy: Secondary | ICD-10-CM

## 2020-12-16 DIAGNOSIS — E669 Obesity, unspecified: Secondary | ICD-10-CM | POA: Diagnosis not present

## 2020-12-16 DIAGNOSIS — N1832 Chronic kidney disease, stage 3b: Secondary | ICD-10-CM

## 2020-12-16 DIAGNOSIS — Z6831 Body mass index (BMI) 31.0-31.9, adult: Secondary | ICD-10-CM

## 2020-12-16 DIAGNOSIS — E1065 Type 1 diabetes mellitus with hyperglycemia: Secondary | ICD-10-CM | POA: Diagnosis not present

## 2020-12-16 DIAGNOSIS — E1022 Type 1 diabetes mellitus with diabetic chronic kidney disease: Secondary | ICD-10-CM

## 2020-12-16 DIAGNOSIS — E66811 Obesity, class 1: Secondary | ICD-10-CM

## 2020-12-16 DIAGNOSIS — Z6832 Body mass index (BMI) 32.0-32.9, adult: Secondary | ICD-10-CM

## 2020-12-16 DIAGNOSIS — F3163 Bipolar disorder, current episode mixed, severe, without psychotic features: Secondary | ICD-10-CM

## 2020-12-16 NOTE — Telephone Encounter (Signed)
Please review

## 2020-12-16 NOTE — Telephone Encounter (Signed)
Pt having issues taking seroquel. Pt said that its making her very tired and she can't function during the day. Pt has appt on 02/09/21. Please call to discuss changing.

## 2020-12-17 NOTE — Telephone Encounter (Signed)
Pt has been informed.

## 2020-12-17 NOTE — Telephone Encounter (Signed)
Let her know I'm out of town.  It will have to wait until I get back next week

## 2020-12-19 ENCOUNTER — Encounter (INDEPENDENT_AMBULATORY_CARE_PROVIDER_SITE_OTHER): Payer: Self-pay | Admitting: Family Medicine

## 2020-12-19 DIAGNOSIS — Z794 Long term (current) use of insulin: Secondary | ICD-10-CM | POA: Insufficient documentation

## 2020-12-19 DIAGNOSIS — D509 Iron deficiency anemia, unspecified: Secondary | ICD-10-CM | POA: Insufficient documentation

## 2020-12-19 NOTE — Progress Notes (Signed)
Chief Complaint:   OBESITY [name] is here to discuss her progress with her obesity treatment plan along with follow-up of her obesity related diagnoses.   Today's visit was #: 15 Starting weight: 199 lbs Starting date: 11/13/2019 Today's weight: 198 lbs Today's date: 12/16/2020 Total lbs lost to date: 1 lb Body mass index is 31.96 kg/m.  Total weight loss percentage to date: -0.50%  Interim History: Hospitalization for chest pain since our last visit. Records reviewed at length.   Chest pain, ACS ruled out. Elevated D-dimer at 0.53. NM lung scan perfusion only test low probability for pulmonary emboli. Her cardiac catheterization showed mild nonobstructive CAD and she was placed on aspirin and Lipitor. She noticed worsening gastroparesis symptoms with initiation of Lipitor so then switched to pravastatin. She also had brief episodes of chest tightness and given her lack of significant CAD, Cardiology placed her on low-dose amlodipine for possible coronary spasm.   Medication changes: Added: ASA, statin, amlodipine. Removed: Ozempic.  Current Meal Plan: keeping a food journal and adhering to recommended goals of 1500 calories and 95 grams of protein for 75% of the time.  Current Exercise Plan: Walking for 30 minutes 2-3 times per week.  Assessment/Plan:   Diagnoses and all orders for this visit:  1. Type 1 diabetes mellitus with hyperglycemia (HCC) Type 1 DM, poorly controlled with neuropathy and gastroparesis, on insulin pump. Hemoglobin A1c 10.5. She misplaced her sensors several weeks ago, so has not been regularly checking blood sugars. She contacted the DME company last week and is still waiting on sensors.   2. Gastroparesis Due to uncontrolled diabetes, exacerbated by medications including Ozempic. This was discontinued. Gastroparesis improving.   3. Bipolar 1 disorder, mixed, severe ([name]) Treatment includes Seroquel and Gabapentin. She reports that she has been taking  them less often due to hypersomnolence. We had a long discussion re: the importance of taking these medications consistently, until changed by Dr. Clovis Pu. Note: 3/25 is the one year anniversary of her father's death.   4. 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 Followed by Nephrology.   5. Precordial pain ACS ruled out. ASA, Lipitor added. [name] increased gastroparesis with Lipitor, so cardiology switched to Pravastatin, and she is tolerating this. [name] also added Amlodipine for potental coronary spasm, noting that she was previously on Enalapril that was discontinued due to hypotension.   6. Noncompliance with medication regimen We reviewed the dangers of abruptly stopping psychiatric medications. She will reach out to Dr. Clovis Pu for an appointment to discuss. We also reviewed the importance of managing blood sugars to decrease the progress of DM related complications.   7. Class 1 obesity with serious comorbidity and body mass index (BMI) of 31.0 to 31.9 in adult, unspecified obesity type  Course: [name] is currently in the action stage of change. As such, her goal is to continue with weight loss efforts.   Nutrition goals: She has agreed to keeping a food journal and adhering to recommended goals of 1500 calories and 95 grams of protein.   Exercise goals: As is.  Behavioral modification strategies: increasing lean protein intake, decreasing simple carbohydrates, increasing water intake, decreasing liquid calories and emotional eating strategies.  [name] has agreed to follow-up with our clinic in 2-3 weeks. She was informed of the importance of frequent follow-up visits to maximize her success with intensive lifestyle modifications for her multiple health conditions.  Objective:   Blood pressure 116/78, pulse 99, temperature 98 F (36.7 C), temperature source Oral, height 5\' 6"  (1.676 m), weight  198 lb (89.8 kg), SpO2 99 %. Body mass index is 31.96 kg/m.  General: Cooperative, alert, well developed, in no acute distress. HEENT: Conjunctivae and lids unremarkable. Cardiovascular: Regular rhythm.  Lungs: Normal work of breathing. Neurologic: No focal deficits.   Lab Results  Component Value Date   CREATININE 1.55 (H) 07/29/2020   BUN 16 07/29/2020   NA 140 07/29/2020   K 5.1 07/29/2020   CL 102 07/29/2020   CO2 25 07/29/2020   Lab Results  Component Value Date   ALT 36 (H) 07/29/2020   AST 24 07/29/2020   ALKPHOS 209 (H) 07/29/2020   BILITOT 0.3 07/29/2020   Lab Results  Component Value Date   HGBA1C 9.8 (H) 07/29/2020   HGBA1C 9.5 (H) 04/15/2020   HGBA1C 8.0 (H) 09/09/2019   HGBA1C 9.0 04/05/2017   HGBA1C 11.2 (H) 11/24/2015   Lab Results  Component Value Date   TSH 1.530 04/15/2020   Lab Results  Component Value Date   CHOL 149 04/22/2014   HDL 71 04/22/2014   LDLCALC 68 04/22/2014   TRIG 49 04/22/2014   CHOLHDL 2.1 04/22/2014   Lab Results  Component Value Date   WBC 6.1 04/15/2020   HGB 13.2 04/15/2020   HCT 39.7 04/15/2020   MCV 89 04/15/2020   PLT 256 04/15/2020   Lab Results  Component Value Date   IRON 41 11/13/2019   TIBC 267 11/13/2019   FERRITIN 77 11/13/2019   Obesity Behavioral Intervention:   Approximately 15 minutes were spent on the discussion below.  ASK: We discussed the diagnosis of obesity with [name] today and [name] agreed to give [name] permission to discuss obesity behavioral modification therapy today.  ASSESS: [name] has the diagnosis of obesity and her BMI today is 32.0. [name] is in the action stage of change.   ADVISE: [name] was educated on the multiple health risks of obesity as well as the benefit of weight loss to improve her health. She was advised of the need for long term treatment and the importance of lifestyle modifications to improve her current health and to decrease her risk of future health  problems.  AGREE: Multiple dietary modification options and treatment options were discussed and [name] agreed to follow the recommendations documented in the above note.  ARRANGE: [name] was educated on the importance of frequent visits to treat obesity as outlined per CMS and USPSTF guidelines and agreed to schedule her next follow up appointment today.  Attestation Statements:   Reviewed by clinician on day of visit: allergies, medications, problem list, medical history, surgical history, family history, social history, and previous encounter notes.  I, Water quality scientist, CMA, am acting as transcriptionist for Briscoe Deutscher, DO  I have reviewed the above documentation for accuracy and completeness, and I agree with the above. Briscoe Deutscher, DO

## 2020-12-22 NOTE — Telephone Encounter (Signed)
She has been on the same dosage of Seroquel for 6 months.  The last time she stopped it she had a severe relapse of symptoms.  I would be willing to consider reduction of Seroquel XR from 600 mg to 400 mg daily.  There is no guarantee that will improve her side effects but if she wants to try it she can do so.  No further med changes beyond that should be made until an appointment.  She has had multiple relapses in the past and I do not want that to happen again to her.  I cannot make major med changes over the phone.

## 2020-12-23 NOTE — Telephone Encounter (Signed)
We can put her on the cancellation list.  But I have no idea when and opening might occur.  She relapsed last time she stopped Seroquel also.  My advice is to restart the Seroquel until we can see her and consider an alternative medication.  I am not going to start a new med outside of an appointment.  If she gets suicidal she should go to the emergency room.

## 2020-12-23 NOTE — Telephone Encounter (Signed)
I informed the pt and she stated that because she was taking the gabapentin with seroquel she believes this is what causes the drowsiness during the day.I recommended the lower dose and she informed me she has stopped taking the Seroquel completely  and is going through a downward spiral with mood changes.She is requesting a earlier appointment.

## 2020-12-24 NOTE — Telephone Encounter (Signed)
Pt informed

## 2020-12-31 ENCOUNTER — Other Ambulatory Visit: Payer: Self-pay

## 2020-12-31 ENCOUNTER — Ambulatory Visit (INDEPENDENT_AMBULATORY_CARE_PROVIDER_SITE_OTHER): Payer: Medicare Other | Admitting: Licensed Clinical Social Worker

## 2020-12-31 DIAGNOSIS — F3162 Bipolar disorder, current episode mixed, moderate: Secondary | ICD-10-CM | POA: Diagnosis not present

## 2020-12-31 NOTE — Progress Notes (Signed)
Virtual Visit via Video Note  I connected with [name] on 12/31/20 at  9:00 AM EDT by a video enabled telemedicine application and verified that I am speaking with the correct person using two identifiers.  Location: Patient: home Provider: remote office [place], [place])   I discussed the limitations of evaluation and management by telemedicine and the availability of in person appointments. The patient expressed understanding and agreed to proceed.  I discussed the assessment and treatment plan with the patient. The patient was provided an opportunity to ask questions and all were answered. The patient agreed with the plan and demonstrated an understanding of the instructions.   The patient was advised to call back or seek an in-person evaluation if the symptoms worsen or if the condition fails to improve as anticipated.  I provided 50 minutes of non-face-to-face time during this encounter.   [name] Gaines Cartmell, LCSW    THERAPIST PROGRESS NOTE  Session Time: 9:00-9:50  Participation Level: Active  Behavioral Response: NeatAlertDepressed and Dysphoric  Type of Therapy: Individual Therapy  Treatment Goals addressed: Anxiety and Coping  Interventions: Other: grief counseling; trauma-focused  Summary: [name] is a 35 y.o. female who presents with escalating symptoms related to bipolar disorder diagnosis. Pt reports that overall mood has been fluctuating due to external stressors and upcoming grief/death date anniversaries (father and brother). Pt reporting good quality and quantity of sleep. Pt reporting that she does not feel that the medication is working well for her--pt reports that she reached out to her psychiatrist and that she cannot get an appointment with Dr. Clovis Pu until May 2022. Pt states that she is currently on a wait/cancellation list.  Allowed pt to explore current thoughts and feeling about self, family, and other stressors. Pt reports that she  feels that her depression symptoms are getting worse, pt is stressed about future building of house, and is stressing over having to help MIL financially and taking away from her own family's savings.  Pt states that she ran out of medication and started developing more depression symptoms, and reached out to the psychiatrist about changing medications. Pts psychiatrist did not want to change medication without office visit, so pt is scheduling one in May. Pt reports that she was instructed to continue taking seroquel until her appt. Encouraged pt to be compliant with psychiatrist's recommendations.   Discussed pts stress and concern over potentially building a house--pt is getting tired of living [place] the [place] the [place] currently. Pt is concerned that her husband is using money in savings (for their home) to give to his mother to help out with her expenses. Pt states that MIL will often put food in the basket when she is shopping for self/children with food stamps. Pt feels that MIL is taking advantage of her. Encouraged pt to set limits or boundaries to protect her feelings; even if that means saying "no" to a family member.  Continued recommendations are as follows: self care behaviors, positive social engagements, focusing on overall work/home/life balance, and focusing on positive physical and emotional wellness.   Suicidal/Homicidal: Pt reports occasional suicidal thoughts with no plans or intent to follow through. Pt states she does not feel she is a danger to herself at time of session. Reviewed local crisis resources.  Therapist Response: Caitlyne is reporting an escalation in symptoms. Encouraged: 1.  Medication compliance, 2. Increase physical activity, 3.  Engage in positive social behaviors (friends/family)  4.  More frequent OPT appts, 5.  Review local  crisis resources if suicidal ideation intensifies or if symptoms continue to intensify  Plan: Return again in 2 weeks.  Diagnosis: Axis  I: Bipolar, mixed    Axis II: No diagnosis    [name] Cristina Mccolm, LCSW 12/31/2020

## 2021-01-06 ENCOUNTER — Ambulatory Visit (INDEPENDENT_AMBULATORY_CARE_PROVIDER_SITE_OTHER): Payer: Medicare Other | Admitting: Family Medicine

## 2021-01-06 ENCOUNTER — Other Ambulatory Visit: Payer: Self-pay

## 2021-01-06 ENCOUNTER — Encounter (INDEPENDENT_AMBULATORY_CARE_PROVIDER_SITE_OTHER): Payer: Self-pay | Admitting: Family Medicine

## 2021-01-06 VITALS — BP 136/87 | HR 91 | Temp 97.9°F | Ht 66.0 in | Wt 199.0 lb

## 2021-01-06 DIAGNOSIS — Z6832 Body mass index (BMI) 32.0-32.9, adult: Secondary | ICD-10-CM | POA: Diagnosis not present

## 2021-01-06 DIAGNOSIS — E669 Obesity, unspecified: Secondary | ICD-10-CM | POA: Diagnosis not present

## 2021-01-06 DIAGNOSIS — E1022 Type 1 diabetes mellitus with diabetic chronic kidney disease: Secondary | ICD-10-CM

## 2021-01-06 DIAGNOSIS — N1832 Chronic kidney disease, stage 3b: Secondary | ICD-10-CM | POA: Diagnosis not present

## 2021-01-06 DIAGNOSIS — E1021 Type 1 diabetes mellitus with diabetic nephropathy: Secondary | ICD-10-CM | POA: Diagnosis not present

## 2021-01-06 DIAGNOSIS — F3163 Bipolar disorder, current episode mixed, severe, without psychotic features: Secondary | ICD-10-CM | POA: Diagnosis not present

## 2021-01-06 NOTE — Progress Notes (Signed)
Chief Complaint:   OBESITY [name] is here to discuss her progress with her obesity treatment plan along with follow-up of her obesity related diagnoses.   Today's visit was #: 16 Starting weight: 199 lbs Starting date: 11/13/2019 Today's weight: 199 lbs Today's date: 01/06/2021 Total lbs lost to date: 0 Body mass index is 32.12 kg/m.   Interim History:  [name] says it was the anniversary of her father and brother's deaths last week.  She has issues with her mother-in-law.  She says she has been more focused on exercise and eating well.  Blood sugars have been less than 200.  Current Meal Plan: keeping a food journal and adhering to recommended goals of 1500 calories and 95 grams of protein for 100% of the time.  Current Exercise Plan: Walking for 30 minutes 5 times per week.  Assessment/Plan:   1. 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 Medication: Novolog, insulin pump. Issues reviewed: blood sugar goals, complications of diabetes mellitus, hypoglycemia prevention and treatment, exercise, and nutrition.  She says her blood sugars have "been really good" (<200).  Plan: The patient was encouraged to monitor blood sugars regularly and to bring their log to the next appointment for review.  The importance of regular follow up with PCP and all other specialists as scheduled was stressed to patient today.  Lab Results  Component Value Date   HGBA1C 9.8 (H) 07/29/2020   HGBA1C 9.5 (H) 04/15/2020   HGBA1C 8.0 (H) 09/09/2019   Lab Results  Component Value Date   LDLCALC 68 04/22/2014   CREATININE 1.55 (H) 07/29/2020   2. Bipolar 1 disorder, mixed, severe ([place]) [name] is taking Seroquel and gabapentin for bipolar disorder. She has been taking lower doses than prescribed and is not taking them as often. She admits to preferring her energy level when she is in the hypomanic range. We discussed the dangers of not taking her  medications as prescribed and encouraged her to discuss with Dr. Clovis Pu.   3. Obesity, current BMI 32.2  Course: [name] is currently in the action stage of change. As such, her goal is to continue with weight loss efforts.   Nutrition goals: She has agreed to keeping a food journal and adhering to recommended goals of 1500 calories and 95 grams of protein.   Exercise goals: As is.  Behavioral modification strategies: increasing lean protein intake, decreasing simple carbohydrates, increasing vegetables and increasing water intake.  [name] has agreed to follow-up with our clinic in 3 weeks. She was informed of the importance of frequent follow-up visits to maximize her success with intensive lifestyle modifications for her multiple health conditions.   Objective:   Blood pressure 136/87, pulse 91, temperature 97.9 F (36.6 C), temperature source Oral, height 5\' 6"  (1.676 m), weight 199 lb (90.3 kg), SpO2 99 %. Body mass index is 32.12 kg/m.  General: Cooperative, alert, well developed, in no acute distress. HEENT: Conjunctivae and lids unremarkable. Cardiovascular: Regular rhythm.  Lungs: Normal work of breathing. Neurologic: No focal deficits.   Lab Results  Component Value Date   CREATININE 1.55 (H) 07/29/2020   BUN 16 07/29/2020   NA 140 07/29/2020   K 5.1 07/29/2020   CL 102 07/29/2020   CO2 25 07/29/2020   Lab Results  Component Value Date   ALT 36 (H) 07/29/2020   AST 24 07/29/2020   ALKPHOS 209 (H) 07/29/2020   BILITOT 0.3 07/29/2020   Lab  Results  Component Value Date   HGBA1C 9.8 (H) 07/29/2020   HGBA1C 9.5 (H) 04/15/2020   HGBA1C 8.0 (H) 09/09/2019   HGBA1C 9.0 04/05/2017   HGBA1C 11.2 (H) 11/24/2015   Lab Results  Component Value Date   TSH 1.530 04/15/2020   Lab Results  Component Value Date   CHOL 149 04/22/2014   HDL 71 04/22/2014   LDLCALC 68 04/22/2014   TRIG 49 04/22/2014   CHOLHDL 2.1 04/22/2014   Lab Results  Component Value Date    WBC 6.1 04/15/2020   HGB 13.2 04/15/2020   HCT 39.7 04/15/2020   MCV 89 04/15/2020   PLT 256 04/15/2020   Lab Results  Component Value Date   IRON 41 11/13/2019   TIBC 267 11/13/2019   FERRITIN 77 11/13/2019   Attestation Statements:   Reviewed by clinician on day of visit: allergies, medications, problem list, medical history, surgical history, family history, social history, and previous encounter notes.  Time spent on visit including pre-visit chart review and post-visit care and charting was 32 minutes.   I, Water quality scientist, CMA, am acting as transcriptionist for Briscoe Deutscher, DO  I have reviewed the above documentation for accuracy and completeness, and I agree with the above. Briscoe Deutscher, DO

## 2021-01-10 DIAGNOSIS — E1022 Type 1 diabetes mellitus with diabetic chronic kidney disease: Secondary | ICD-10-CM | POA: Diagnosis not present

## 2021-01-10 DIAGNOSIS — E1040 Type 1 diabetes mellitus with diabetic neuropathy, unspecified: Secondary | ICD-10-CM | POA: Diagnosis not present

## 2021-01-10 DIAGNOSIS — I10 Essential (primary) hypertension: Secondary | ICD-10-CM | POA: Diagnosis not present

## 2021-01-10 DIAGNOSIS — N1832 Chronic kidney disease, stage 3b: Secondary | ICD-10-CM | POA: Diagnosis not present

## 2021-01-10 DIAGNOSIS — Z794 Long term (current) use of insulin: Secondary | ICD-10-CM | POA: Diagnosis not present

## 2021-01-14 ENCOUNTER — Ambulatory Visit (INDEPENDENT_AMBULATORY_CARE_PROVIDER_SITE_OTHER): Payer: Medicare Other | Admitting: Licensed Clinical Social Worker

## 2021-01-14 ENCOUNTER — Other Ambulatory Visit: Payer: Self-pay

## 2021-01-14 DIAGNOSIS — F411 Generalized anxiety disorder: Secondary | ICD-10-CM | POA: Diagnosis not present

## 2021-01-14 DIAGNOSIS — Z634 Disappearance and death of family member: Secondary | ICD-10-CM

## 2021-01-14 DIAGNOSIS — F3162 Bipolar disorder, current episode mixed, moderate: Secondary | ICD-10-CM

## 2021-01-14 NOTE — Progress Notes (Signed)
Virtual Visit via Video Note  I connected with [name] on 01/14/21 at  8:00 AM EDT by a video enabled telemedicine application and verified that I am speaking with the correct person using two identifiers.  Location: Patient: home Provider: remote office [place], [place])   I discussed the limitations of evaluation and management by telemedicine and the availability of in person appointments. The patient expressed understanding and agreed to proceed.   I discussed the assessment and treatment plan with the patient. The patient was provided an opportunity to ask questions and all were answered. The patient agreed with the plan and demonstrated an understanding of the instructions.   The patient was advised to call back or seek an in-person evaluation if the symptoms worsen or if the condition fails to improve as anticipated.  I provided 60 minutes of non-face-to-face time during this encounter.   [name] Cristina Lona Six, LCSW    THERAPIST PROGRESS NOTE  Session Time: 8-9a  Participation Level: Active  Behavioral Response: Neat and Well GroomedAlertAnxious  Type of Therapy: Individual Therapy  Treatment Goals addressed: Anxiety and Coping  Interventions: CBT and Solution Focused  Summary: [name] is a 35 y.o. female who presents with symptoms consistent with bipolar disorder. Pt reports currently that overall mood is stable and that she is managing stress and anxiety better. Pt reports improving quality and quantity of sleep.  Allowed pt to explore and express thoughts and feelings associated with family relationships and current living situation.  Discussed pts progress and how she is being intentional about making positive changes and focusing on self care when children are at school.    Pt is aware of overall life conflicts from past and present that are forming the basis for her current anxiety, and is utilizing coping skills to manage those.   Continued  recommendations are as follows: self care behaviors, positive social engagements, focusing on overall work/home/life balance, and focusing on positive physical and emotional wellness.    Suicidal/Homicidal: No  Therapist Response: [name] is continuing to be inconsistent with her medication and sometimes will skip days. Reiterated importance of medication compliance to patient. [name] will continue to work on developing the ability to recognize, accept and cope with feelings of depression. [name] is also trying to prioritize recreational and physical activities that boost overall mood. Behaviors show evidence of personal growth and progress. Treatment to continue.  Plan: Return again in 4 weeks.  Diagnosis: Axis I: Bipolar, mixed, Bereavement and Generalized Anxiety Disorder    Axis II: No diagnosis    Cristina Bo Cristina Bluestein, LCSW 01/14/2021

## 2021-01-17 ENCOUNTER — Encounter: Payer: Self-pay | Admitting: Nurse Practitioner

## 2021-01-17 ENCOUNTER — Telehealth (INDEPENDENT_AMBULATORY_CARE_PROVIDER_SITE_OTHER): Payer: Medicare Other | Admitting: Nurse Practitioner

## 2021-01-17 ENCOUNTER — Other Ambulatory Visit: Payer: Self-pay

## 2021-01-17 DIAGNOSIS — J45909 Unspecified asthma, uncomplicated: Secondary | ICD-10-CM

## 2021-01-17 DIAGNOSIS — J4531 Mild persistent asthma with (acute) exacerbation: Secondary | ICD-10-CM

## 2021-01-17 MED ORDER — MONTELUKAST SODIUM 10 MG PO TABS: 10.0000 mg | ORAL_TABLET | Freq: Every day | ORAL | 1 refills | Status: DC

## 2021-01-17 MED ORDER — PREDNISONE 5 MG PO TABS
5.0000 mg | ORAL_TABLET | Freq: Every day | ORAL | 0 refills | Status: DC
Start: 2021-01-17 — End: 2021-01-24

## 2021-01-17 MED ORDER — MOMETASONE FURO-FORMOTEROL FUM 100-5 MCG/ACT IN AERO
2.0000 | INHALATION_SPRAY | Freq: Two times a day (BID) | RESPIRATORY_TRACT | 11 refills | Status: DC
Start: 2021-01-17 — End: 2021-04-14

## 2021-01-17 MED ORDER — BENZONATATE 100 MG PO CAPS: 100.0000 mg | ORAL_CAPSULE | Freq: Two times a day (BID) | ORAL | 0 refills | Status: DC | PRN

## 2021-01-17 NOTE — Progress Notes (Signed)
Subjective:    Patient ID: [name], female    DOB: [birth year], 35 y.o.   MRN: [id number]  HPI: [name] is a 35 y.o. female presenting virtually for sinus problem.  Chief Complaint  Patient presents with  . Sinus Problem    Pt feels due to climate, having allergy flare with congestion, productive coughing, runny nose and loss of voice sine past Friday. Using mucinex for sx.   . Medication Refill    Singular refill (medication sent)   UPPER RESPIRATORY TRACT INFECTION Very allergic to environmental stuff - tress/allergies COVID vaccination: not vaccinated COVID testing history: not tested Onset: Friday night Fever: no Cough: yes  Hoarseness: yes Shortness of breath: no Wheezing: yes; inhaler used a few times Chest pain: no Chest tightness: no Chest congestion: yes Nasal congestion: yes; this is improved Runny nose: no Post nasal drip: yes Sneezing: no Sore throat: yes Swollen glands: no Sinus pressure: yes; above eye eye brown Headache: no Face pain: no Toothache: no Ear pain: no  Ear pressure: no  Eyes red/itching:yes Eye drainage/crusting: no  Nausea: no  Vomiting: no Diarrhea: no  Change in appetite: no change  Loss of taste/smell: no  Rash: no Fatigue: no Sick contacts: no Strep contacts: no   Context: worse Recurrent sinusitis: no Treatments attempted: Mucinex, Singulair, Zrytec, flonase Relief with OTC medications: no  Allergies  Allergen Reactions  . Nsaids Other (See Comments)    Due to Renal disease  . Other   . Toradol [Ketorolac Tromethamine]     Cannot take d/t kidney issues   . Ceftin Rash    Outpatient Encounter Medications as of 01/17/2021  Medication Sig Note  . albuterol (PROVENTIL) (2.5 MG/3ML) 0.083% nebulizer solution Take 3 mLs (2.5 mg total) by nebulization every 6 (six) hours as needed for wheezing or shortness of breath.   Marland Kitchen albuterol (VENTOLIN HFA) 108 (90 Base) MCG/ACT inhaler TAKE 2 PUFFS BY MOUTH EVERY 6  HOURS AS NEEDED FOR WHEEZE OR SHORTNESS OF BREATH   . amLODipine (NORVASC) 2.5 MG tablet Take 2.5 mg by mouth daily.   Marland Kitchen aspirin 81 MG chewable tablet Chew by mouth.   Marland Kitchen buPROPion (WELLBUTRIN SR) 200 MG 12 hr tablet TAKE 1 TABLET BY MOUTH TWICE A DAY   . clonazePAM (KLONOPIN) 0.5 MG tablet Take 1 tablet (0.5 mg total) by mouth every 8 (eight) hours as needed. for anxiety   . fluticasone (FLONASE) 50 MCG/ACT nasal spray Place 2 sprays into both nostrils daily. (Patient taking differently: Place 2 sprays into both nostrils daily as needed (seasonal allergies.).)   . furosemide (LASIX) 40 MG tablet Take 40 mg by mouth every other day.    Marland Kitchen GLUCAGON EMERGENCY 1 MG injection Inject 1 mg into the skin as needed (hypoglycemia.).    . Insulin Human (INSULIN PUMP) SOLN Inject 1 each into the skin 3 times daily with meals, bedtime and 2 AM. Novolog: 36-38 units per day   . Insulin Pen Needle 32G X 4 MM MISC 1 each by Does not apply route once a week.   Marland Kitchen levonorgestrel (MIRENA, 52 MG,) 20 MCG/24HR IUD 1 each by Intrauterine route once.   . metoCLOPramide (REGLAN) 5 MG tablet TAKE 1 TABLET (5 MG TOTAL) BY MOUTH 3 (THREE) TIMES DAILY BEFORE MEALS.   . mometasone-formoterol (DULERA) 100-5 MCG/ACT AERO Inhale 2 puffs into the lungs 2 (two) times daily.   Marland Kitchen NOVOLOG 100 UNIT/ML injection Inject 36-38 Units into the skin daily. Via insulin  pump per sliding scale 10/16/2019: Took 5 units SQ  . polyethylene glycol (MIRALAX / GLYCOLAX) 17 g packet Take 17 g by mouth 2 (two) times daily as needed (constipation).   . pravastatin (PRAVACHOL) 20 MG tablet SMARTSIG:1 Tablet(s) By Mouth Every Evening   . promethazine (PHENERGAN) 25 MG tablet Take 1 tablet (25 mg total) by mouth every 8 (eight) hours as needed for nausea or vomiting.   . Prucalopride Succinate (MOTEGRITY) 2 MG TABS Take 1 tablet (2 mg total) by mouth every morning.   Marland Kitchen QUEtiapine (SEROQUEL XR) 300 MG 24 hr tablet Take 2 tablets (600 mg total) by mouth at  bedtime.   Marland Kitchen tiZANidine (ZANAFLEX) 4 MG tablet Take 4 mg by mouth 2 (two) times daily as needed for muscle spasms.    Marland Kitchen topiramate (TOPAMAX) 50 MG tablet Take 1.5 tablets (75 mg total) by mouth 2 (two) times daily.   Marland Kitchen Ubrogepant (UBRELVY) 100 MG TABS Take 100 mg by mouth daily as needed (migraine headaches.).    Marland Kitchen Vitamin D, Ergocalciferol, (DRISDOL) 1.25 MG (50000 UNIT) CAPS capsule Take 1 capsule (50,000 Units total) by mouth every 7 (seven) days.   . [DISCONTINUED] montelukast (SINGULAIR) 10 MG tablet Take 1 tablet (10 mg total) by mouth at bedtime.   Marland Kitchen amLODipine (NORVASC) 2.5 MG tablet    . Botulinum Toxin Type A (BOTOX) 200 units SOLR Inject 155 Units as directed every 3 (three) months.  (Patient not taking: Reported on 01/17/2021)   . gabapentin (NEURONTIN) 100 MG capsule 2 capsules at 6 PM and 2 capsules at 9 PM (Patient not taking: Reported on 01/17/2021)   . [DISCONTINUED] SUMAtriptan Succinate 3 MG/0.5ML SOAJ Inject 0.5 mLs into the skin See admin instructions. To use as needed for migraines    No facility-administered encounter medications on file as of 01/17/2021.    Patient Active Problem List   Diagnosis Date Noted  . Iron deficiency anemia 12/19/2020  . Long term (current) use of insulin ([place]) 12/19/2020  . Migraine 11/15/2020  . CKD stage 3 secondary to diabetes ([place]) 11/15/2020  . Diabetic neuropathy ([place]) 11/15/2020  . 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 Polyphagia 08/27/2020  . Bipolar 1 disorder, mixed, severe ([place]) 08/27/2020  . Type 1 diabetes mellitus with hyperglycemia ([place]) 08/27/2020  . Financial difficulties 08/27/2020  . Drug-induced weight gain 08/27/2020  . Traction detachment of right retina 08/27/2020  . Vitreous hemorrhage of left eye ([place]) 08/27/2020  . Hypertension associated with diabetes ([place]) 08/27/2020  . Type 1 diabetes mellitus with diabetic polyneuropathy 08/27/2020  . Class 1 obesity with serious  comorbidity and body mass index (BMI) of 33.0 to 33.9 in adult 08/27/2020  . Status post eye surgery 07/01/2020  . Chronic migraine without aura 01/22/2020  . S/P eye surgery 08/29/2019  . Diplopia 07/23/2019  . Epiretinal membrane (ERM) of left eye 07/23/2019  . 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  . 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 Pseudophakia of left eye 11/04/2018  . Chronic kidney disease, stage 3b ([place]) 01/01/2017  . Diabetic nephropathy associated with type 1 diabetes mellitus ([place]) 03/01/2016  . Drug overdose, intentional ([place]) 03/27/2014  . Asthma 11/10/2013  . MDD (major depressive disorder), recurrent episode, severe ([place]) 09/26/2013  . Generalized anxiety disorder 09/26/2013  . Aphakia, right eye 09/14/2012  . Retinal detachment, tractional, left eye 06/11/2012  . Eczema 06/07/2012  . Diabetic retinopathy associated with type 1 diabetes mellitus ([place])  04/30/2012  . H/O insertion of insulin pump 04/30/2012  . History of atrial tachycardia 08/08/2010  . Gastroparesis due to DM ([place]) 08/08/2010    Past Medical History:  Diagnosis Date  . Anemia   . Anemia of chronic disease 06/07/2012   Formatting of this note might be different from the original. Hematology consultation 07-13-16 (see note)--> Anemia of chronic disease Required 3 transfusions during pregnancy  Last Assessment & Plan:  Formatting of this note might be different from the original. She has now required 3 blood transfusions this pregnancy for her anemia of chronic disease. The most recent was one week ago when she bec  . Anxiety   . Asthma   . Asthma 11/10/2013  . Back pain   . Bipolar 1 disorder ([place])   . Chest pain   . Chronic renal failure syndrome, stage 3 (moderate) ([facility])   . Colles' fracture of left radius 10/11/2013  . Complication of anesthesia   . Constipation   . Depression   . Depression   . Diabetic gastroparesis ([place])   . Diabetic neuropathy, type I diabetes mellitus ([place])   .  Diabetic ulcer of right great toe ([place]) 08/27/2020  . Edema, lower extremity   . Gallbladder disease   . Gastroparesis   . GERD (gastroesophageal reflux disease)   . Headache(784.0)   . Hypertension   . Joint pain   . Kidney stones   . Palpitations   . Polyneuropathy in diabetes(357.2)   . PONV (postoperative nausea and vomiting)   . 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[place])   . S/P carpal tunnel release left 10/16/19 11/04/2019  . Shortness of breath   . Tachycardia    baseline tachycardia   . 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)     Relevant past medical, surgical, family and social history reviewed and updated as indicated. Interim medical history since our last visit reviewed.  Review of Systems Per HPI unless specifically indicated above     Objective:    There were no vitals taken for this visit.  Wt Readings from Last 3 Encounters:  01/06/21 199 lb (90.3 kg)  12/16/20 198 lb (89.8 kg)  10/28/20 197 lb (89.4 kg)    Physical Exam Vitals and nursing note reviewed.  Constitutional:      General: She is not in acute distress.    Appearance: Normal appearance. She is not toxic-appearing.  HENT:     Head: Normocephalic and atraumatic.     Right Ear: External ear normal.     Left Ear: External ear normal.     Nose: Nose normal. No congestion.     Mouth/Throat:     Mouth: Mucous membranes are moist.     Pharynx: Oropharynx is clear.  Eyes:     General: No scleral icterus.    Extraocular Movements: Extraocular movements intact.  Cardiovascular:     Comments: Unable to assess heart sounds via virtual visit Pulmonary:     Effort: Pulmonary effort is normal. No respiratory distress.     Comments: Unable to assess lung sounds via virtual visit; patient talking in complete sentences during examination without accessory muscle use.  Occasional coarse cough  Musculoskeletal:     Cervical back: Normal range of motion.  Skin:     Coloration: Skin is not jaundiced or pale.     Findings: No erythema.  Neurological:     Mental Status: She is alert and oriented to person, place, and  time.  Psychiatric:        Mood and Affect: Mood normal.        Behavior: Behavior normal.        Thought Content: Thought content normal.        Judgment: Judgment normal.        Assessment & Plan:  1. Moderate asthma without complication, unspecified whether persistent Acute.  Will treat with prednisone, antitussives and resume allergy regimen.  Continue albuterol inhaler/nebulizer as needed for shortness of breath/wheezing.  Follow up if not improving near end of week and will start antibiotics.  2. Mild persistent asthma with exacerbation Acute.  Will treat with prednisone, antitussives and resume allergy regimen.  Continue albuterol inhaler/nebulizer as needed for shortness of breath/wheezing.  Follow up if not improving near end of week and will start antibiotics.    Follow up plan: Return if symptoms worsen or fail to improve.   Due to the catastrophic nature of the COVID-19 pandemic, this video visit was completed in part via audio and visual contact via [facility] due to the restrictions of the COVID-19 pandemic. video connection was lost at <50% of the duration of the visit, at which time the remainder of the visit was completed via audio only.  All issues as above were discussed and addressed. Physical exam was done as above through visual confirmation on Caregility. If it was felt that the patient should be evaluated in the office, they were directed there. The patient verbally consented to this visit.  Patient was connected to the video visit and she was able to hear and see me, however I was only able to see and could not hear her.  Visit was then transitioned to a phone call visit with the patient's consent. . Location of the patient: home . Location of the provider: home . Those involved with this call:  . Provider:  Noemi Chapel, DNP, FNP-C . CMA: Annabelle Harman, CMA . Front Desk/Registration: Santina Evans  . Time spent on call: 11 minutes on the phone discussing health concerns. 14 minutes total spent in review of patient's record and preparation of their chart.  I verified patient identity using two factors (patient name and date of birth). Patient consents verbally to being seen via telemedicine visit today.

## 2021-01-19 ENCOUNTER — Telehealth: Payer: Self-pay | Admitting: *Deleted

## 2021-01-19 DIAGNOSIS — J45909 Unspecified asthma, uncomplicated: Secondary | ICD-10-CM

## 2021-01-19 NOTE — Telephone Encounter (Signed)
Received fax requesting alternative to [facility].   Reports that medication is not covered by insurance.

## 2021-01-20 MED ORDER — BUDESONIDE-FORMOTEROL FUMARATE 80-4.5 MCG/ACT IN AERO
2.0000 | INHALATION_SPRAY | Freq: Two times a day (BID) | RESPIRATORY_TRACT | 1 refills | Status: DC
Start: 2021-01-20 — End: 2022-06-22

## 2021-01-20 NOTE — Telephone Encounter (Signed)
Cancel Dulera  Please send in: budesonide 64mcg / formoterol 4.5 mcg oral inhalation 2 puffs twice daily.  Rinse mouth with water and spit after each use.

## 2021-01-20 NOTE — Telephone Encounter (Signed)
DONE

## 2021-01-24 ENCOUNTER — Ambulatory Visit (INDEPENDENT_AMBULATORY_CARE_PROVIDER_SITE_OTHER): Payer: Medicare Other | Admitting: Family Medicine

## 2021-01-24 ENCOUNTER — Encounter (INDEPENDENT_AMBULATORY_CARE_PROVIDER_SITE_OTHER): Payer: Self-pay | Admitting: Family Medicine

## 2021-01-24 ENCOUNTER — Other Ambulatory Visit: Payer: Self-pay

## 2021-01-24 VITALS — BP 118/80 | HR 85 | Temp 98.0°F | Ht 66.0 in | Wt 197.0 lb

## 2021-01-24 DIAGNOSIS — Z6832 Body mass index (BMI) 32.0-32.9, adult: Secondary | ICD-10-CM | POA: Diagnosis not present

## 2021-01-24 DIAGNOSIS — N1832 Chronic kidney disease, stage 3b: Secondary | ICD-10-CM | POA: Diagnosis not present

## 2021-01-24 DIAGNOSIS — F3163 Bipolar disorder, current episode mixed, severe, without psychotic features: Secondary | ICD-10-CM

## 2021-01-24 DIAGNOSIS — E669 Obesity, unspecified: Secondary | ICD-10-CM | POA: Diagnosis not present

## 2021-01-24 DIAGNOSIS — E1022 Type 1 diabetes mellitus with diabetic chronic kidney disease: Secondary | ICD-10-CM

## 2021-01-24 DIAGNOSIS — E1021 Type 1 diabetes mellitus with diabetic nephropathy: Secondary | ICD-10-CM | POA: Diagnosis not present

## 2021-01-24 DIAGNOSIS — G43809 Other migraine, not intractable, without status migrainosus: Secondary | ICD-10-CM

## 2021-01-24 MED ORDER — TOPIRAMATE 50 MG PO TABS: 75.0000 mg | ORAL_TABLET | Freq: Two times a day (BID) | ORAL | 0 refills | Status: DC

## 2021-01-25 NOTE — Progress Notes (Signed)
Chief Complaint:   OBESITY [name] is here to discuss her progress with her obesity treatment plan along with follow-up of her obesity related diagnoses.   Today's visit was #: 84 Starting weight: 199 lbs Starting date: 11/13/2019 Today's weight: 197 lbs Today's date: 01/24/2021 Total lbs lost to date: 2 lbs Body mass index is 31.8 kg/m.  Total weight loss percentage to date: -1.01%  Interim History:  [name] is not taking her psychiatric medications.  She endorses some irritability. Current Meal Plan: keeping a food journal and adhering to recommended goals of 1500 calories and 95 grams of protein for 100% of the time.  Current Exercise Plan: Walking/yard work for 30 minutes 3 times per week.  Assessment/Plan:   1. Other migraine without status migrainosus, not intractable Continue Topamax.  Will refill today, as per below.  - Refill topiramate (TOPAMAX) 50 MG tablet; Take 1.5 tablets (75 mg total) by mouth 2 (two) times daily.  Dispense: 270 tablet; Refill: 0  2. 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 Medication: Insulin pump.  Needs less insulin lately.  She endorses a couple of lows.  Issues reviewed: blood sugar goals, complications of diabetes mellitus, hypoglycemia prevention and treatment, exercise, and nutrition.  Sensors are at her mother's house.  Plan: The patient was encouraged to monitor blood sugars regularly and to bring their log to the next appointment for review.  The importance of regular follow up with PCP and all other specialists as scheduled was stressed to patient today.  Lab Results  Component Value Date   HGBA1C 9.8 (H) 07/29/2020   HGBA1C 9.5 (H) 04/15/2020   HGBA1C 8.0 (H) 09/09/2019   Lab Results  Component Value Date   LDLCALC 68 04/22/2014   CREATININE 1.55 (H) 07/29/2020   3. Bipolar 1 disorder, mixed, severe ([name]) [name] has not been taking her medications. Risk versus  benefits of medications reviewed. The patient understands monitoring parameters and red flags. She was encouraged to follow up with her Psychiatrist.   4. Obesity, current BMI 31.8  Course: [name] is currently in the action stage of change. As such, her goal is to continue with weight loss efforts.   Nutrition goals: She has agreed to keeping a food journal and adhering to recommended goals of 1500 calories and 95 grams of protein.   Exercise goals: For substantial health benefits, adults should do at least 150 minutes (2 hours and 30 minutes) a week of moderate-intensity, or 75 minutes (1 hour and 15 minutes) a week of vigorous-intensity aerobic physical activity, or an equivalent combination of moderate- and vigorous-intensity aerobic activity. Aerobic activity should be performed in episodes of at least 10 minutes, and preferably, it should be spread throughout the week.  Behavioral modification strategies: increasing lean protein intake, decreasing simple carbohydrates, increasing vegetables, increasing water intake and decreasing liquid calories.  [name] has agreed to follow-up with our clinic in 2-4 weeks. She was informed of the importance of frequent follow-up visits to maximize her success with intensive lifestyle modifications for her multiple health conditions.   Objective:   Blood pressure 118/80, pulse 85, temperature 98 F (36.7 C), temperature source Oral, height 5\' 6"  (1.676 m), weight 197 lb (89.4 kg), SpO2 99 %. Body mass index is 31.8 kg/m.  General: Cooperative, alert, well developed, in no acute distress. HEENT: Conjunctivae and lids unremarkable. Cardiovascular: Regular rhythm.  Lungs: Normal work of breathing. Neurologic: No focal deficits.  Lab Results  Component Value Date   CREATININE 1.55 (H) 07/29/2020   BUN 16 07/29/2020   NA 140 07/29/2020   K 5.1 07/29/2020   CL 102 07/29/2020   CO2 25 07/29/2020   Lab Results  Component Value Date   ALT 36 (H)  07/29/2020   AST 24 07/29/2020   ALKPHOS 209 (H) 07/29/2020   BILITOT 0.3 07/29/2020   Lab Results  Component Value Date   HGBA1C 9.8 (H) 07/29/2020   HGBA1C 9.5 (H) 04/15/2020   HGBA1C 8.0 (H) 09/09/2019   HGBA1C 9.0 04/05/2017   HGBA1C 11.2 (H) 11/24/2015   Lab Results  Component Value Date   TSH 1.530 04/15/2020   Lab Results  Component Value Date   CHOL 149 04/22/2014   HDL 71 04/22/2014   LDLCALC 68 04/22/2014   TRIG 49 04/22/2014   CHOLHDL 2.1 04/22/2014   Lab Results  Component Value Date   WBC 6.1 04/15/2020   HGB 13.2 04/15/2020   HCT 39.7 04/15/2020   MCV 89 04/15/2020   PLT 256 04/15/2020   Lab Results  Component Value Date   IRON 41 11/13/2019   TIBC 267 11/13/2019   FERRITIN 77 11/13/2019   Obesity Behavioral Intervention:   Approximately 15 minutes were spent on the discussion below.  ASK: We discussed the diagnosis of obesity with Dashae today and Michi agreed to give [name] permission to discuss obesity behavioral modification therapy today.  ASSESS: [name] has the diagnosis of obesity and her BMI today is 31.8. [name] is in the action stage of change.   ADVISE: [name] was educated on the multiple health risks of obesity as well as the benefit of weight loss to improve her health. She was advised of the need for long term treatment and the importance of lifestyle modifications to improve her current health and to decrease her risk of future health problems.  AGREE: Multiple dietary modification options and treatment options were discussed and [name] agreed to follow the recommendations documented in the above note.  ARRANGE: [name] was educated on the importance of frequent visits to treat obesity as outlined per CMS and USPSTF guidelines and agreed to schedule her next follow up appointment today.  Attestation Statements:   Reviewed by clinician on day of visit: allergies, medications, problem list, medical history, surgical history,  family history, social history, and previous encounter notes.  I, Water quality scientist, CMA, am acting as transcriptionist for Briscoe Deutscher, DO  I have reviewed the above documentation for accuracy and completeness, and I agree with the above. Briscoe Deutscher, DO

## 2021-02-02 DIAGNOSIS — F3170 Bipolar disorder, currently in remission, most recent episode unspecified: Secondary | ICD-10-CM | POA: Diagnosis not present

## 2021-02-02 DIAGNOSIS — G43909 Migraine, unspecified, not intractable, without status migrainosus: Secondary | ICD-10-CM | POA: Diagnosis not present

## 2021-02-02 DIAGNOSIS — B001 Herpesviral vesicular dermatitis: Secondary | ICD-10-CM | POA: Diagnosis not present

## 2021-02-02 DIAGNOSIS — E1065 Type 1 diabetes mellitus with hyperglycemia: Secondary | ICD-10-CM | POA: Diagnosis not present

## 2021-02-02 DIAGNOSIS — E10319 Type 1 diabetes mellitus with unspecified diabetic retinopathy without macular edema: Secondary | ICD-10-CM | POA: Diagnosis not present

## 2021-02-02 DIAGNOSIS — N183 Chronic kidney disease, stage 3 unspecified: Secondary | ICD-10-CM | POA: Diagnosis not present

## 2021-02-02 DIAGNOSIS — K3184 Gastroparesis: Secondary | ICD-10-CM | POA: Diagnosis not present

## 2021-02-02 DIAGNOSIS — E1122 Type 2 diabetes mellitus with diabetic chronic kidney disease: Secondary | ICD-10-CM | POA: Diagnosis not present

## 2021-02-02 DIAGNOSIS — J4540 Moderate persistent asthma, uncomplicated: Secondary | ICD-10-CM | POA: Diagnosis not present

## 2021-02-03 ENCOUNTER — Encounter (INDEPENDENT_AMBULATORY_CARE_PROVIDER_SITE_OTHER): Payer: Self-pay | Admitting: Family Medicine

## 2021-02-09 ENCOUNTER — Other Ambulatory Visit: Payer: Self-pay | Admitting: Internal Medicine

## 2021-02-09 DIAGNOSIS — N1832 Chronic kidney disease, stage 3b: Secondary | ICD-10-CM | POA: Diagnosis not present

## 2021-02-09 DIAGNOSIS — I10 Essential (primary) hypertension: Secondary | ICD-10-CM | POA: Diagnosis not present

## 2021-02-09 DIAGNOSIS — R748 Abnormal levels of other serum enzymes: Secondary | ICD-10-CM

## 2021-02-09 DIAGNOSIS — E1065 Type 1 diabetes mellitus with hyperglycemia: Secondary | ICD-10-CM | POA: Diagnosis not present

## 2021-02-09 DIAGNOSIS — E1040 Type 1 diabetes mellitus with diabetic neuropathy, unspecified: Secondary | ICD-10-CM | POA: Diagnosis not present

## 2021-02-09 DIAGNOSIS — Z794 Long term (current) use of insulin: Secondary | ICD-10-CM | POA: Diagnosis not present

## 2021-02-09 DIAGNOSIS — E1022 Type 1 diabetes mellitus with diabetic chronic kidney disease: Secondary | ICD-10-CM | POA: Diagnosis not present

## 2021-02-10 ENCOUNTER — Encounter: Payer: Self-pay | Admitting: Psychiatry

## 2021-02-10 ENCOUNTER — Other Ambulatory Visit: Payer: Self-pay

## 2021-02-10 ENCOUNTER — Ambulatory Visit (INDEPENDENT_AMBULATORY_CARE_PROVIDER_SITE_OTHER): Payer: Medicare Other | Admitting: Psychiatry

## 2021-02-10 DIAGNOSIS — F4001 Agoraphobia with panic disorder: Secondary | ICD-10-CM | POA: Diagnosis not present

## 2021-02-10 DIAGNOSIS — G2581 Restless legs syndrome: Secondary | ICD-10-CM

## 2021-02-10 DIAGNOSIS — F3162 Bipolar disorder, current episode mixed, moderate: Secondary | ICD-10-CM

## 2021-02-10 DIAGNOSIS — F411 Generalized anxiety disorder: Secondary | ICD-10-CM | POA: Diagnosis not present

## 2021-02-10 NOTE — Progress Notes (Signed)
[name] [id number] November 17, 1985 35 y.o.    Subjective:   Patient ID:  [name] is a 35 y.o. (DOB 1986/02/02) female.  Chief Complaint:  Chief Complaint  Patient presents with  . Follow-up  . Bipolar 1 disorder, mixed, severe  . Medication Problem  . Agitation    Depression        [name] presents to the office today for follow-up of bipolar disorder and generalized anxiety disorder. Historically seen with [name].  When seen June 05, 2019.  The following changes were made: Change pramipexole ER and increase to 0.75 mg pm to rid nausea and improve mood in the afternoon.  For tiredness in the afternoon switch Equetro to 400 mg AM , 1@ 6 and 1 at 11. For panic in daytime start gabapentin 300 mg each am as preventative.  She's only taking it prn now. We continued unchanged Latuda 120 mg and lamotrigine 300 mg twice daily for bipolar disorder. She just got the ER pramipexole about a week ago.  Can't tell difference with nausea DT  gastroparesis worse lately with constipation Less afternoon tiredness with current split Equetro dose.    Last seen July 22, 2019.  Meds were not changed at that visit.  She called back to October 20 wanting to try valproate because of racing thoughts and difficulty sleeping.  Stating the trazodone was not working.  She was told it was okay to start Depakote ER 500 mg nightly and then gradually increased to 1500 mg nightly.  She called back again on October 6 and this physician noted the following: RTC Pt called to report new med she started having a lot of side effects mood swings, sounds in ears, suicidal thoughts, tremors. Causing issues with husband She is currently taking Depakote ER 1500 mg nightly. She commits to safety. The only symptom directly attributable to Depakote would be the tremor.  However its presence would indicate she is not going to be able to tolerate a higher dose of Depakote to control her mood  symptoms.  Therefore we will wean the Depakote. In order to control the mood symptoms we will increase the Equetro at the evening dose which should not cause significant side effects problems during the day but help with mood stability.  This increase should also make it easier to wean the Depakote quickly.  She agrees to this plan.  She will call back if the suicidal thoughts become too intense to tolerate. Reduce Depakote ER 500 mg tablets to 1 a night for 4 nights then stop Increase Equetro 200 mg capsules to 2 in the morning and 3 nightly. Cristina Parents, MD, DFAPA  She's been back on Equetro and doing OK. 2 weeks leading to Xmas with a lot of anger and lashing out a good bit.  Had run out of mirapex and Latuda for a couple of days in that time frame.  Crying is less now.  No SE CBZ now.  A lot of migraines off her Trokendi bc risk kidney stones.  Plans Botox for HA.  Usually sleeping OK without trazodone.   Still taking Latuda, buspirone, gabapentin, lamotrigine 300 mg BID.    seen 12.30.21 without med change.    12/17/19 appt with the following noted: Since then increased gabapentin for anxiety to 600 BID-TID which helped but caused some tiredness.  Needing trazodone more bc EMA/EFA.  She's stopped caffeine.  Mood stability is getting better despite a lot of stress. $ stress and racing thoughts at  night and scattered some with stressors.  Depression is mild lately.  Took trip with H last week helped.  Coming up on 2 year anniversary of B's death.  B diabetic died last year with DM and kidney failure. Not as much anger irritability.  Some mood swings early FEB but better now. Custody battle with exH.  He's nicer to her now.   Weight going up. Hates weight gain effects of meds.    Consistent with meds.  No sig SI other than fleeting rarely.  Scattered concentration and racing thoughts randomly.   Sleep OK. Plan: No med changes/ FU 4 mos  06/18/20 TC saying she stopped all psych meds end of  August.  06/22/20 appt with the following noted: Had gradually dropped off meds and not sure why.  Stoped Latuda, buspirone, CBZ. Pramipexole., and lamotrigine.  Sleep Ok without trazodone.  Gabapentin less tolerated as prn. Stopped Equetro over a month ago DT insurance.  Back on regular medicare and it might be covered. A lot of stress here lately. Lost father 01/01/20 of MI.  since here and called got propranolol and it hleped crying.  Also got some clonazepam.   Had given up on life and everything.  Not SI today but has been.  No plan.  Depression and mood swings including anger.  Anxiety. She and therapist say she's depressed and recommended IOP at [facility]. Agrees to retry Seroquel which helped in the past Plan: Acute decompensation because of psychiatric noncompliance. quetiapine ER 1 at night for 4 nights, then 2 at night for 4 nights, then 3 at night.  07/13/2020 appointment with the following noted: Patient called in between appointments complaining of quetiapine call causing restless legs.  Gabapentin was called in with instructions to make this side effect resolve. Misunderstood and only taking 300 mg Seroquel XR for a couple of days. Referred to [facility] and has continued therapy. Feels better on Seroquel.  No longer SI.  More stable.  Less anger.  Depression down to mild generally.  Fewer panic attacks. Sleeps with Seroquel 8 hours.  RLS is managed fairly well at this time. Initially too tired with Seroquel and better now. Clonazepam once in 2 weeks for panic driving. Referred to IOP and continued Seroquel ER 600 HS  08/10/20 appt with the following noted: Insurance wouldn't cover enough of IOP.  Still seeing therapist every 2 weeks. Consistent with Seroquel XR 600 daily and needs gabapentin with it DT RLS. No longer taking propranolol. Taking clonazepam only twice in last month. I feel pretty stable right now.  First Xmas without father.  Feels better than mos ago.  More  stable.  Sleep good.  Hangover if late with Seroquel.  Not unusually angry, under control.  Depression is better.  Occ down day.  Function is pretty good with routine things.   Energy is not great esp in the AM Gabapentin will make her feel too drunk if dose is too high. SE Seroquel makes her hungry. Plan: no med changes  09/09/2020 appointment with the following noted: Gabapentin manages the RLS. Compliant with Seroquel XR 600 mg HS. Kind of stressed out and sort of moody.  Mild mood swings with some on edge and agitated easily.  No SI. Kind of down over weight management visit and lack of progress.  No energy to want to do anything.  Sleep is fine with Seroquel.  In bed at least 8 hours daily. Used Klonopin twice since last visit.  Triggered panic attack  last week. Back on Reglan for 2 mos. Plan: Prescription given with schedule to increase to the target dose of Wellbutrin SR 100 mg tablets 2 twice daily and topiramate 25 mg tablets 2 twice daily both to help with weight management and energy and mild depression.  12/16/2020 phone call wanting to stop Seroquel.  02/10/2021 appointment with the following noted: Missed appt here.  She stopped Seroquel on her own gradually and completely about a month ago.  Was taking it off and on.  The gabapentin was interfering with her ability to function.  Wanting to lay down.  But had to take the gabapentin bc Seroquel giving her RLS.  Still very moody but not over the edge like she was in the past. Clonazepam once or twice weekly.  Stopped gabapentin bc no longer has RLS off the Seroquel. No trouble sleeping usually. Feels agitated but not manic.  A lot of stress at home. Lives in [place] beside in-laws with conflict over $ with them.  A lot of stress right now and hurt and anger with them over things. Not many friends to talk to and not a lot of conversation with H.  Feels like needs to restart therapy. Doing video therapy with Christina Hussami. Wellbutrin  seemed to help mental state some but didn't help weight much.  Remarried April 2020.  35 yo D ADD is stressful and disrespectful. Disability review approved mostly for psych reasons.  Going to [facility] weight management and just started vitamin D. Level 24.5.  Past Psychiatric Medication Trials: lamotrigine 300 twice daily, Equetro 900 daily,  Depakote 1500 SE, History topiramate for migraine brief Vraylar,  Abilify 15,  lithium felt slowed down,  Seroquel worked but stopped DT insurance and SE RLS and hunger,  Latuda 120, sertraline, duloxetine,  gabapentin 600 mg 3 times daily tiredness,  buspirone 30 twice daily,,  clonazepam, trazodone, Xanax , hydroxyzine NR,  History of suicidal ideation and about 5 suicide attempts with about 4 psychiatric hospitalizations  Review of Systems:  Review of Systems  Constitutional: Positive for unexpected weight change.  Cardiovascular: Negative for palpitations.  Gastrointestinal: Negative for nausea and vomiting.  Musculoskeletal: Positive for arthralgias.  Neurological: Negative for tremors and weakness.  Psychiatric/Behavioral: Positive for depression. Negative for sleep disturbance.    Medications: I have reviewed the patient's current medications.  Current Outpatient Medications  Medication Sig Dispense Refill  . albuterol (PROVENTIL) (2.5 MG/3ML) 0.083% nebulizer solution Take 3 mLs (2.5 mg total) by nebulization every 6 (six) hours as needed for wheezing or shortness of breath. 150 mL 1  . albuterol (VENTOLIN HFA) 108 (90 Base) MCG/ACT inhaler TAKE 2 PUFFS BY MOUTH EVERY 6 HOURS AS NEEDED FOR WHEEZE OR SHORTNESS OF BREATH 18 each 2  . amLODipine (NORVASC) 2.5 MG tablet Take 2.5 mg by mouth daily.    Marland Kitchen aspirin 81 MG chewable tablet Chew by mouth.    . budesonide-formoterol (SYMBICORT) 80-4.5 MCG/ACT inhaler Inhale 2 puffs into the lungs 2 (two) times daily. RINSE MOUTH WITH WATER AND SPIT AFTER EVERY USE 1 each 1  . buPROPion (WELLBUTRIN SR)  200 MG 12 hr tablet TAKE 1 TABLET BY MOUTH TWICE A DAY 60 tablet 1  . clonazePAM (KLONOPIN) 0.5 MG tablet Take 1 tablet (0.5 mg total) by mouth every 8 (eight) hours as needed. for anxiety 20 tablet 0  . fluticasone (FLONASE) 50 MCG/ACT nasal spray Place 2 sprays into both nostrils daily. (Patient taking differently: Place 2 sprays into both nostrils daily as  needed (seasonal allergies.).) 16 g 2  . furosemide (LASIX) 40 MG tablet Take 40 mg by mouth every other day.     Marland Kitchen GLUCAGON EMERGENCY 1 MG injection Inject 1 mg into the skin as needed (hypoglycemia.).     . Insulin Human (INSULIN PUMP) SOLN Inject 1 each into the skin 3 times daily with meals, bedtime and 2 AM. Novolog: 36-38 units per day    . Insulin Pen Needle 32G X 4 MM MISC 1 each by Does not apply route once a week. 50 each 0  . levonorgestrel (MIRENA, 52 MG,) 20 MCG/24HR IUD 1 each by Intrauterine route once.    . metoCLOPramide (REGLAN) 5 MG tablet TAKE 1 TABLET (5 MG TOTAL) BY MOUTH 3 (THREE) TIMES DAILY BEFORE MEALS. 90 tablet 0  . montelukast (SINGULAIR) 10 MG tablet Take 1 tablet (10 mg total) by mouth at bedtime. 90 tablet 1  . NOVOLOG 100 UNIT/ML injection Inject 36-38 Units into the skin daily. Via insulin pump per sliding scale    . polyethylene glycol (MIRALAX / GLYCOLAX) 17 g packet Take 17 g by mouth 2 (two) times daily as needed (constipation). 14 each 0  . pravastatin (PRAVACHOL) 20 MG tablet SMARTSIG:1 Tablet(s) By Mouth Every Evening    . promethazine (PHENERGAN) 25 MG tablet Take 1 tablet (25 mg total) by mouth every 8 (eight) hours as needed for nausea or vomiting. 30 tablet 0  . Prucalopride Succinate (MOTEGRITY) 2 MG TABS Take 1 tablet (2 mg total) by mouth every morning. 30 tablet 1  . tiZANidine (ZANAFLEX) 4 MG tablet Take 4 mg by mouth 2 (two) times daily as needed for muscle spasms.     Marland Kitchen topiramate (TOPAMAX) 50 MG tablet Take 1.5 tablets (75 mg total) by mouth 2 (two) times daily. 270 tablet 0  . Ubrogepant  (UBRELVY) 100 MG TABS Take 100 mg by mouth daily as needed (migraine headaches.).     Marland Kitchen Vitamin D, Ergocalciferol, (DRISDOL) 1.25 MG (50000 UNIT) CAPS capsule Take 1 capsule (50,000 Units total) by mouth every 7 (seven) days. 4 capsule 0  . Botulinum Toxin Type A (BOTOX) 200 units SOLR Inject 155 Units as directed every 3 (three) months. (Patient not taking: Reported on 02/10/2021)    . gabapentin (NEURONTIN) 100 MG capsule 2 capsules at 6 PM and 2 capsules at 9 PM (Patient not taking: No sig reported) 120 capsule 3  . mometasone-formoterol (DULERA) 100-5 MCG/ACT AERO Inhale 2 puffs into the lungs 2 (two) times daily. (Patient not taking: Reported on 02/10/2021) 13 g 11  . QUEtiapine (SEROQUEL XR) 300 MG 24 hr tablet Take 2 tablets (600 mg total) by mouth at bedtime. (Patient not taking: Reported on 02/10/2021) 60 tablet 2   No current facility-administered medications for this visit.    Medication Side Effects: tired in the afternoon.  Allergies:  Allergies  Allergen Reactions  . Nsaids Other (See Comments)    Due to Renal disease  . Other   . Toradol [Ketorolac Tromethamine]     Cannot take d/t kidney issues   . Ceftin Rash    Past Medical History:  Diagnosis Date  . Anemia   . Anemia of chronic disease 06/07/2012   Formatting of this note might be different from the original. Hematology consultation 07-13-16 (see note)--> Anemia of chronic disease Required 3 transfusions during pregnancy  Last Assessment & Plan:  Formatting of this note might be different from the original. She has now required 3 blood transfusions  this pregnancy for her anemia of chronic disease. The most recent was one week ago when she bec  . Anxiety   . Asthma   . Asthma 11/10/2013  . Back pain   . Bipolar 1 disorder ([place])   . Chest pain   . Chronic renal failure syndrome, stage 3 (moderate) (HCC)   . Colles' fracture of left radius 10/11/2013  . Complication of anesthesia   . Constipation   . Depression   .  Depression   . Diabetic gastroparesis ([place])   . Diabetic neuropathy, type I diabetes mellitus ([place])   . Diabetic ulcer of right great toe ([place]) 08/27/2020  . Edema, lower extremity   . Gallbladder disease   . Gastroparesis   . GERD (gastroesophageal reflux disease)   . Headache(784.0)   . Hypertension   . Joint pain   . Kidney stones   . Palpitations   . Polyneuropathy in diabetes(357.2)   . PONV (postoperative nausea and vomiting)   . 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 S/P carpal tunnel release left 10/16/19 11/04/2019  . Shortness of breath   . Tachycardia    baseline tachycardia   . 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 Hypertension Other   . Diabetes Mother        type 2  . Hypertension Mother   . Obesity Mother   . Diabetes Brother        type 1  . Renal Disease Brother        renal failure  . Hypertension Brother   . Diabetes Father   . Hypertension Father   . High Cholesterol Father   . Sleep apnea Father   . Obesity Father   . Heart disease Father   . Breast cancer Maternal Aunt 64  . Colon cancer Maternal Aunt   . Uterine cancer Maternal Aunt 65       same as breast cancer  . Uterine cancer Maternal Aunt 61  . Rectal cancer Neg Hx   . Esophageal cancer Neg Hx     Social History   Socioeconomic History  . Marital status: Legally Separated    Spouse name: Not on file  . Number of children: 2  . Years of education: 105  . Highest education level: 12th grade  Occupational History  . Occupation: Home maker  Tobacco Use  . Smoking status: Never Smoker  . Smokeless tobacco: Never Used  Vaping Use  . Vaping Use: Never used  Substance and Sexual Activity  . Alcohol use: No  . Drug use: No  . Sexual activity: Yes    Partners: Male    Birth control/protection: I.U.D.  Other Topics Concern  . Not on file  Social History Narrative   Pt has a  daughter and boyfriend.    Dad passed away suddenly heart attack 12/2019   Social Determinants of Health   Financial Resource Strain: High Risk  . Difficulty of Paying Living Expenses: Very hard  Food Insecurity: Food Insecurity Present  . Worried About Charity fundraiser in the Last Year: Sometimes true  . Ran Out of Food in the Last Year: Sometimes true  Transportation Needs: No Transportation Needs  . Lack of Transportation (Medical): No  . Lack of Transportation (Non-Medical): No  Physical Activity: Inactive  . Days of Exercise per Week: 0 days  . Minutes of  Exercise per Session: 0 min  Stress: Stress Concern Present  . Feeling of Stress : Very much  Social Connections: Moderately Integrated  . Frequency of Communication with Friends and Family: More than three times a week  . Frequency of Social Gatherings with Friends and Family: More than three times a week  . Attends Religious Services: More than 4 times per year  . Active Member of Clubs or Organizations: No  . Attends Archivist Meetings: Never  . Marital Status: Living with partner  Intimate Partner Violence: Not At Risk  . Fear of Current or Ex-Partner: No  . Emotionally Abused: No  . Physically Abused: No  . Sexually Abused: No    Past Medical History, Surgical history, Social history, and Family history were reviewed and updated as appropriate.   Please see review of systems for further details on the patient's review from today.   Objective:   Physical Exam:  There were no vitals taken for this visit.  Physical Exam Constitutional:      Appearance: She is obese.  Neurological:     Mental Status: She is alert and oriented to person, place, and time.     Cranial Nerves: No dysarthria.  Psychiatric:        Attention and Perception: Attention and perception normal.        Mood and Affect: Mood is anxious and depressed. Affect is not angry.        Speech: Speech normal.        Behavior: Behavior  is cooperative.        Thought Content: Thought content normal. Thought content is not paranoid or delusional. Thought content does not include homicidal or suicidal ideation. Thought content does not include homicidal or suicidal plan.        Cognition and Memory: Cognition and memory normal.        Judgment: Judgment normal.     Comments: Insight intact      Lab Review:     Component Value Date/Time   NA 140 07/29/2020 1246   K 5.1 07/29/2020 1246   CL 102 07/29/2020 1246   CO2 25 07/29/2020 1246   GLUCOSE 111 (H) 07/29/2020 1246   GLUCOSE 147 (H) 10/14/2019 0920   BUN 16 07/29/2020 1246   CREATININE 1.55 (H) 07/29/2020 1246   CREATININE 1.81 (H) 06/17/2018 1231   CALCIUM 9.5 07/29/2020 1246   PROT 6.5 07/29/2020 1246   ALBUMIN 4.1 07/29/2020 1246   AST 24 07/29/2020 1246   ALT 36 (H) 07/29/2020 1246   ALKPHOS 209 (H) 07/29/2020 1246   BILITOT 0.3 07/29/2020 1246   GFRNONAA 43 (L) 07/29/2020 1246   GFRNONAA 36 (L) 06/17/2018 1231   GFRAA 50 (L) 07/29/2020 1246   GFRAA 42 (L) 06/17/2018 1231       Component Value Date/Time   WBC 6.1 04/15/2020 1635   WBC 7.9 10/14/2019 0920   RBC 4.48 04/15/2020 1635   RBC 4.52 10/14/2019 0920   HGB 13.2 04/15/2020 1635   HCT 39.7 04/15/2020 1635   PLT 256 04/15/2020 1635   MCV 89 04/15/2020 1635   MCH 29.5 04/15/2020 1635   MCH 29.6 10/14/2019 0920   MCHC 33.2 04/15/2020 1635   MCHC 32.0 10/14/2019 0920   RDW 12.3 04/15/2020 1635   LYMPHSABS 1.7 04/15/2020 1635   MONOABS 0.8 10/14/2019 0920   EOSABS 0.6 (H) 04/15/2020 1635   BASOSABS 0.1 04/15/2020 1635    No results found for: POCLITH, LITHIUM  No results found for: PHENYTOIN, PHENOBARB, VALPROATE, CBMZ   .res Assessment: Plan:    Camellia was seen today for follow-up, bipolar 1 disorder, mixed, severe, medication problem and agitation.  Diagnoses and all orders for this visit:  Bipolar 1 disorder, mixed, severe  Generalized anxiety disorder  Panic disorder  with agoraphobia  Restless leg syndrome, controlled    Greater than 50% of 30 min non face to face time with patient was spent on counseling and coordination of care. We discussed Patient with a history of multiple med failures and history of psychiatric hospitalization and suicide attempts recent worsening of bipolar depression.  Chronic stressors contribute to mood and anxiety problems.   Concern about potential weight gain with medications.  She has taken the major mood stabilizers with low weight gain potential. Discussed the pros and cons of switching mood stabilizers.  Could consider ultra low-dose lithium.   She recently failed to tolerate VPA.    May consider stopping Wellbutrin when starts Vraylar.  Consider retrying Vraylar bc weight gain.  Yes Start 1.5 mg for 1 week then 3 mg daily.  Discussed potential metabolic side effects associated with atypical antipsychotics, as well as potential risk for movement side effects. Advised pt to contact office if movement side effects occur.    RLS Not a problem now.  Follow-up 2 months  Cristina Parents, MD, DFAPA   Please see After Visit Summary for patient specific instructions.  Future Appointments  Date Time Provider [place]  02/11/2021 10:00 AM Hussami, [name], LCSW [facility] None  02/23/2021 11:00 AM Briscoe Deutscher, DO [facility] None  02/28/2021  9:45 AM [facility] [name] [facility]    No orders of the defined types were placed in this encounter.   -------------------------------

## 2021-02-11 ENCOUNTER — Ambulatory Visit (INDEPENDENT_AMBULATORY_CARE_PROVIDER_SITE_OTHER): Payer: Medicare Other | Admitting: Licensed Clinical Social Worker

## 2021-02-11 DIAGNOSIS — F411 Generalized anxiety disorder: Secondary | ICD-10-CM

## 2021-02-11 DIAGNOSIS — F3162 Bipolar disorder, current episode mixed, moderate: Secondary | ICD-10-CM | POA: Diagnosis not present

## 2021-02-11 NOTE — Progress Notes (Signed)
Virtual Visit via Video Note  I connected with [name] on 02/11/21 at 10:00 AM EDT by a video enabled telemedicine application and verified that I am speaking with the correct person using two identifiers.  Location: Patient: home Provider: remote office [place], [place])   I discussed the limitations of evaluation and management by telemedicine and the availability of in person appointments. The patient expressed understanding and agreed to proceed.   I discussed the assessment and treatment plan with the patient. The patient was provided an opportunity to ask questions and all were answered. The patient agreed with the plan and demonstrated an understanding of the instructions.   The patient was advised to call back or seek an in-person evaluation if the symptoms worsen or if the condition fails to improve as anticipated.  I provided 45 minutes of non-face-to-face time during this encounter.   [name], LCSW    THERAPIST PROGRESS NOTE  Session Time: 10-10:45a  Participation Level: Active  Behavioral Response: NeatAlertAnxious  Type of Therapy: Individual Therapy  Treatment Goals addressed: Anxiety  Interventions: CBT, Solution Focused and Supportive  Summary: [name] is a 35 y.o. female who presents with symptoms associated with bipolar disorder. Pt reports that mood has been fluctuating due to situational stressors. Pt recently got a new puppy (husband brought home) and pt is upset that she is the primary caregiver to the new puppy which is a larger breed dog in their camper. Pt reports that she is also upset that her husband is helping his mother and stepdad remodel their house when there are unfinished projects in their camper that he is just leaving unfinished. Pt feels very overwhelmed about this and is worrying about the summer when Caleb's children are coming to spend the summer with them.  Recommended that pt look into [employer]  to see if they qualify for summer camps or memberships for the kids to keep them active and socially engaged.  Pt reports that psychiatrist recently changed medication to Vraylar, so she is hoping that this will do a better job of stabilizing mood. Discussed importance of self care and utilizing "me time" whenever pt can.   Continued recommendations are as follows: self care behaviors, positive social engagements, focusing on overall work/home/life balance, and focusing on positive physical and emotional wellness.   Suicidal/Homicidal: No  Therapist Response: [name] is trying hard to utilize coping skills recommended to manage mood and anxiety fluctuations. [name] is able to identify life conflict from past and present and understand how they form present levels of anxiety. [name] is able to verbalize an understanding of the role that fearful thinking plays in creating fears, excessive worry, and persistent anxiety symptoms. [name] is trying hard to engage in physical and recreational activities that reflect increased energy and interest. These behaviors are reflective of both personal growth and progress. Treatment to continue as indicated.  Plan: Return again in 4 weeks. Pt requesting in person visit.  Diagnosis: Axis I: Bipolar, Depressed and Generalized Anxiety Disorder    Axis II: No diagnosis    [place], LCSW 02/11/2021

## 2021-02-18 DIAGNOSIS — E1065 Type 1 diabetes mellitus with hyperglycemia: Secondary | ICD-10-CM | POA: Diagnosis not present

## 2021-02-18 DIAGNOSIS — E1040 Type 1 diabetes mellitus with diabetic neuropathy, unspecified: Secondary | ICD-10-CM | POA: Diagnosis not present

## 2021-02-22 ENCOUNTER — Other Ambulatory Visit: Payer: Self-pay

## 2021-02-22 DIAGNOSIS — Z0189 Encounter for other specified special examinations: Secondary | ICD-10-CM

## 2021-02-22 MED ORDER — BUPROPION HCL ER (SR) 200 MG PO TB12: 200.0000 mg | ORAL_TABLET | Freq: Two times a day (BID) | ORAL | 0 refills | Status: DC

## 2021-02-23 ENCOUNTER — Other Ambulatory Visit: Payer: Self-pay

## 2021-02-23 ENCOUNTER — Other Ambulatory Visit: Payer: Self-pay | Admitting: Psychiatry

## 2021-02-23 ENCOUNTER — Ambulatory Visit (INDEPENDENT_AMBULATORY_CARE_PROVIDER_SITE_OTHER): Payer: Medicare Other | Admitting: Family Medicine

## 2021-02-23 ENCOUNTER — Telehealth: Payer: Self-pay | Admitting: Psychiatry

## 2021-02-23 ENCOUNTER — Encounter (INDEPENDENT_AMBULATORY_CARE_PROVIDER_SITE_OTHER): Payer: Self-pay | Admitting: Family Medicine

## 2021-02-23 VITALS — BP 122/81 | HR 95 | Temp 98.3°F | Ht 66.0 in | Wt 195.0 lb

## 2021-02-23 DIAGNOSIS — Z6832 Body mass index (BMI) 32.0-32.9, adult: Secondary | ICD-10-CM

## 2021-02-23 DIAGNOSIS — F3162 Bipolar disorder, current episode mixed, moderate: Secondary | ICD-10-CM

## 2021-02-23 DIAGNOSIS — E559 Vitamin D deficiency, unspecified: Secondary | ICD-10-CM

## 2021-02-23 DIAGNOSIS — E669 Obesity, unspecified: Secondary | ICD-10-CM

## 2021-02-23 DIAGNOSIS — R632 Polyphagia: Secondary | ICD-10-CM | POA: Diagnosis not present

## 2021-02-23 DIAGNOSIS — G43809 Other migraine, not intractable, without status migrainosus: Secondary | ICD-10-CM

## 2021-02-23 MED ORDER — CARIPRAZINE HCL 3 MG PO CAPS: 3.0000 mg | ORAL_CAPSULE | Freq: Every day | ORAL | 1 refills | Status: DC

## 2021-02-23 MED ORDER — VITAMIN D (ERGOCALCIFEROL) 1.25 MG (50000 UNIT) PO CAPS: 50000.0000 [IU] | ORAL_CAPSULE | ORAL | 0 refills | Status: DC

## 2021-02-23 NOTE — Telephone Encounter (Signed)
He has had appointment on 02/10/2021 we added Vraylar and increase the dose to 3 mg daily.  Please send in 3 mg Vraylar #31 daily and 1 refill

## 2021-02-23 NOTE — Telephone Encounter (Signed)
Im trying to add a new order for it but vraylar won't come up even when I enter the generic name

## 2021-02-23 NOTE — Telephone Encounter (Signed)
I am not sure what happened.  It was already in her medication list.  I was able to hit refill and it went through.

## 2021-02-23 NOTE — Telephone Encounter (Signed)
Done but may get PA for Vraylar if you've not already done it.

## 2021-02-23 NOTE — Telephone Encounter (Signed)
Mei called in with update for Vraylar 3mg . States that she has been tolerating it well she would like for a full prescription to sent in. Pharmacy [facility] [address] [place]

## 2021-02-23 NOTE — Telephone Encounter (Signed)
Please review.Ok to send?

## 2021-02-28 ENCOUNTER — Ambulatory Visit
Admission: RE | Admit: 2021-02-28 | Discharge: 2021-02-28 | Disposition: A | Discharge status: Home / Self Care | Payer: Medicare Other | Source: Ambulatory Visit | Attending: Internal Medicine | Admitting: Internal Medicine

## 2021-02-28 DIAGNOSIS — R748 Abnormal levels of other serum enzymes: Secondary | ICD-10-CM

## 2021-02-28 MED ORDER — TOPIRAMATE 50 MG PO TABS: 75.0000 mg | ORAL_TABLET | Freq: Two times a day (BID) | ORAL | 0 refills | Status: DC

## 2021-03-01 NOTE — Progress Notes (Signed)
Chief Complaint:   OBESITY [name] is here to discuss her progress with her obesity treatment plan along with follow-up of her obesity related diagnoses.   Today's visit was #: 17 Starting weight: 199 lbs Starting date: 11/13/2019 Today's weight: 195 lbs Today's date: 02/23/2021 Weight change since last visit: 2 lbs Total lbs lost to date: 4 lbs Body mass index is 31.47 kg/m.  Total weight loss percentage to date: -2.01%  Interim History:  Anglea says she is back on Thrive.  She is still taking Wellbutrin.  She says she is on Vrylar now and has noticed in increase in her blood sugar.  This has required more insulin (1.2-1.4).  She is wearing sensors now.  She has been having night eating syndrome.  She is scheduled for a liver ultrasound (Dr. Sharl Ma) due to elevated alk phos.  Current Meal Plan: keeping a food journal and adhering to recommended goals of 1500 calories and 95 grams of protein for 100% of the time.  Current Exercise Plan: Waking 6,000-10,000 steps per day 7 days per week.  Assessment/Plan:    Meds ordered this encounter  Medications  . Vitamin D, Ergocalciferol, (DRISDOL) 1.25 MG (50000 UNIT) CAPS capsule    Sig: Take 1 capsule (50,000 Units total) by mouth every 7 (seven) days.    Dispense:  4 capsule    Refill:  0  . topiramate (TOPAMAX) 50 MG tablet    Sig: Take 1.5 tablets (75 mg total) by mouth 2 (two) times daily.    Dispense:  270 tablet    Refill:  0    1. Vitamin D deficiency Not at goal. Current vitamin D is 32.0, tested on 06/30/2020. Optimal goal > 50 ng/dL.  She is taking vitamin D 50,000 IU weekly.  Plan: Continue to take prescription Vitamin D @50 ,000 IU every week as prescribed.  Follow-up for routine testing of Vitamin D, at least 2-3 times per year to avoid over-replacement.  - Refill Vitamin D, Ergocalciferol, (DRISDOL) 1.25 MG (50000 UNIT) CAPS capsule; Take 1 capsule (50,000 Units total) by mouth every 7 (seven) days.  Dispense: 4 capsule;  Refill: 0  2. Polyphagia Current treatment: Wellbutrin and topiramate. Polyphagia refers to excessive feelings of hunger. She will continue to focus on protein-rich, low simple carbohydrate foods. We reviewed the importance of hydration, regular exercise for stress reduction, and restorative sleep.  New IC is lower.  3. Other migraine without status migrainosus, not intractable [name] is taking Topamax 75 mg twice daily for migraine prevention.  Plan:  Continue Topamax.  Will refill today, as per below.  If GFR is <70, decrease dose by 50%.  - Refill topiramate (TOPAMAX) 50 MG tablet; Take 1.5 tablets (75 mg total) by mouth 2 (two) times daily.  Dispense: 270 tablet; Refill: 0  4. Obesity, current BMI 31.5  Course: [name] is currently in the action stage of change. As such, her goal is to continue with weight loss efforts.   Nutrition goals: She has agreed to keeping a food journal and adhering to recommended goals of 1500 calories and 95 grams of protein.   Exercise goals: For substantial health benefits, adults should do at least 150 minutes (2 hours and 30 minutes) a week of moderate-intensity, or 75 minutes (1 hour and 15 minutes) a week of vigorous-intensity aerobic physical activity, or an equivalent combination of moderate- and vigorous-intensity aerobic activity. Aerobic activity should be performed in episodes of at least 10 minutes, and preferably, it should be  spread throughout the week.  Behavioral modification strategies: increasing lean protein intake, decreasing simple carbohydrates, increasing vegetables and increasing water intake.  [name] has agreed to follow-up with our clinic in 4 weeks. She was informed of the importance of frequent follow-up visits to maximize her success with intensive lifestyle modifications for her multiple health conditions.   Objective:   Blood pressure 122/81, pulse 95, temperature 98.3 F (36.8 C), temperature source Oral, height $RemoveBefo'5\' 6"'OausDetNwxx$   (1.676 m), weight 195 lb (88.5 kg), SpO2 100 %. Body mass index is 31.47 kg/m.  General: Cooperative, alert, well developed, in no acute distress. HEENT: Conjunctivae and lids unremarkable. Cardiovascular: Regular rhythm.  Lungs: Normal work of breathing. Neurologic: No focal deficits.   Lab Results  Component Value Date   CREATININE 1.55 (H) 07/29/2020   BUN 16 07/29/2020   NA 140 07/29/2020   K 5.1 07/29/2020   CL 102 07/29/2020   CO2 25 07/29/2020   Lab Results  Component Value Date   ALT 36 (H) 07/29/2020   AST 24 07/29/2020   ALKPHOS 209 (H) 07/29/2020   BILITOT 0.3 07/29/2020   Lab Results  Component Value Date   HGBA1C 9.8 (H) 07/29/2020   HGBA1C 9.5 (H) 04/15/2020   HGBA1C 8.0 (H) 09/09/2019   HGBA1C 9.0 04/05/2017   HGBA1C 11.2 (H) 11/24/2015   Lab Results  Component Value Date   TSH 1.530 04/15/2020   Lab Results  Component Value Date   CHOL 149 04/22/2014   HDL 71 04/22/2014   LDLCALC 68 04/22/2014   TRIG 49 04/22/2014   CHOLHDL 2.1 04/22/2014   Lab Results  Component Value Date   WBC 6.1 04/15/2020   HGB 13.2 04/15/2020   HCT 39.7 04/15/2020   MCV 89 04/15/2020   PLT 256 04/15/2020   Lab Results  Component Value Date   IRON 41 11/13/2019   TIBC 267 11/13/2019   FERRITIN 77 11/13/2019   Obesity Behavioral Intervention:   Approximately 15 minutes were spent on the discussion below.  ASK: We discussed the diagnosis of obesity with [name] today and [name] agreed to give [name] permission to discuss obesity behavioral modification therapy today.  ASSESS: [name] has the diagnosis of obesity and her BMI today is 31.5. [name] is in the action stage of change.   ADVISE: [name] was educated on the multiple health risks of obesity as well as the benefit of weight loss to improve her health. She was advised of the need for long term treatment and the importance of lifestyle modifications to improve her current health and to decrease her risk of  future health problems.  AGREE: Multiple dietary modification options and treatment options were discussed and [name] agreed to follow the recommendations documented in the above note.  ARRANGE: [name] was educated on the importance of frequent visits to treat obesity as outlined per CMS and USPSTF guidelines and agreed to schedule her next follow up appointment today.  Attestation Statements:   Reviewed by clinician on day of visit: allergies, medications, problem list, medical history, surgical history, family history, social history, and previous encounter notes.  I, Water quality scientist, CMA, am acting as transcriptionist for Briscoe Deutscher, DO  I have reviewed the above documentation for accuracy and completeness, and I agree with the above. Briscoe Deutscher, DO

## 2021-03-04 ENCOUNTER — Ambulatory Visit
Admission: RE | Admit: 2021-03-04 | Discharge: 2021-03-04 | Disposition: A | Discharge status: Home / Self Care | Payer: Medicare Other | Source: Ambulatory Visit | Attending: Internal Medicine | Admitting: Internal Medicine

## 2021-03-04 DIAGNOSIS — R748 Abnormal levels of other serum enzymes: Secondary | ICD-10-CM | POA: Diagnosis not present

## 2021-03-16 ENCOUNTER — Ambulatory Visit (INDEPENDENT_AMBULATORY_CARE_PROVIDER_SITE_OTHER): Payer: Medicare Other | Admitting: Licensed Clinical Social Worker

## 2021-03-16 ENCOUNTER — Other Ambulatory Visit: Payer: Self-pay

## 2021-03-16 ENCOUNTER — Encounter (INDEPENDENT_AMBULATORY_CARE_PROVIDER_SITE_OTHER): Payer: Self-pay | Admitting: Family Medicine

## 2021-03-16 DIAGNOSIS — F411 Generalized anxiety disorder: Secondary | ICD-10-CM

## 2021-03-16 DIAGNOSIS — F3162 Bipolar disorder, current episode mixed, moderate: Secondary | ICD-10-CM | POA: Diagnosis not present

## 2021-03-16 NOTE — Progress Notes (Signed)
Virtual Visit via Video Note  I connected with [name] on 03/16/21 at  4:00 PM EDT by a video enabled telemedicine application and verified that I am speaking with the correct person using two identifiers.  Location: Patient: in vehicle ([place]) Provider: [facility]   I discussed the limitations of evaluation and management by telemedicine and the availability of in person appointments. The patient expressed understanding and agreed to proceed.  I discussed the assessment and treatment plan with the patient. The patient was provided an opportunity to ask questions and all were answered. The patient agreed with the plan and demonstrated an understanding of the instructions.   The patient was advised to call back or seek an in-person evaluation if the symptoms worsen or if the condition fails to improve as anticipated.  I provided 30 minutes of non-face-to-face time during this encounter.   Trellis Vanoverbeke R Shayda Kalka, LCSW   THERAPIST PROGRESS NOTE  Session Time: 4-4:30p  Participation Level: Active  Behavioral Response: Neat and Well GroomedAlertAnxious  Type of Therapy: Individual Therapy  Treatment Goals addressed: Anxiety and Coping  Interventions: Family Systems  Summary: Maliyah Willets is a 35 y.o. female who presents with improving symptoms related to bipolar disorder and anxiety diagnoses. Pt reports that she has been on vacation recently, so overall mood is stable. Pt reports that she has been managing stress and anxiety well. Pt reports good quality and quantity of sleep. Pt reports that she has been prioritizing self care and diabetes self management techniques.  Reviewed some coping skills that pt has been utilizing--pt is not comfortable since there are other family members in the car with pt. Once LCSW clinician was aware of this, session was cut short.   Continued recommendations are as follows: self care behaviors, positive social engagements, focusing on overall  work/home/life balance, and focusing on positive physical and emotional wellness. .   Suicidal/Homicidal: No  Therapist Response: Tawan is trying hard to utilize coping skills recommended to manage mood and anxiety fluctuations. Mylissa is able to identify life conflict from past and present and understand how they form present levels of anxiety. Denicia is able to verbalize an understanding of the role that fearful thinking plays in creating fears, excessive worry, and persistent anxiety symptoms. Laury is trying hard to engage in physical and recreational activities that reflect increased energy and interest. These behaviors are reflective of both personal growth and progress. Treatment to continue as indicated  Plan: Return again in 4 weeks. Pt requested appt on call list if someone cancels.   Diagnosis: Axis I: Bipolar, Depressed and Generalized Anxiety Disorder    Axis II: No diagnosis    [place], LCSW 03/16/2021

## 2021-03-17 DIAGNOSIS — R748 Abnormal levels of other serum enzymes: Secondary | ICD-10-CM | POA: Diagnosis not present

## 2021-03-17 DIAGNOSIS — E1065 Type 1 diabetes mellitus with hyperglycemia: Secondary | ICD-10-CM | POA: Diagnosis not present

## 2021-03-17 DIAGNOSIS — E1022 Type 1 diabetes mellitus with diabetic chronic kidney disease: Secondary | ICD-10-CM | POA: Diagnosis not present

## 2021-03-17 DIAGNOSIS — Z794 Long term (current) use of insulin: Secondary | ICD-10-CM | POA: Diagnosis not present

## 2021-03-17 DIAGNOSIS — I10 Essential (primary) hypertension: Secondary | ICD-10-CM | POA: Diagnosis not present

## 2021-03-17 DIAGNOSIS — E1040 Type 1 diabetes mellitus with diabetic neuropathy, unspecified: Secondary | ICD-10-CM | POA: Diagnosis not present

## 2021-03-17 DIAGNOSIS — N1832 Chronic kidney disease, stage 3b: Secondary | ICD-10-CM | POA: Diagnosis not present

## 2021-03-17 LAB — HEMOGLOBIN A1C: Hemoglobin A1C: 9.2

## 2021-03-21 DIAGNOSIS — E1040 Type 1 diabetes mellitus with diabetic neuropathy, unspecified: Secondary | ICD-10-CM | POA: Diagnosis not present

## 2021-03-21 DIAGNOSIS — E1065 Type 1 diabetes mellitus with hyperglycemia: Secondary | ICD-10-CM | POA: Diagnosis not present

## 2021-03-26 ENCOUNTER — Other Ambulatory Visit: Payer: Self-pay | Admitting: Psychiatry

## 2021-03-26 DIAGNOSIS — F430 Acute stress reaction: Secondary | ICD-10-CM

## 2021-03-26 DIAGNOSIS — F4001 Agoraphobia with panic disorder: Secondary | ICD-10-CM

## 2021-03-28 NOTE — Telephone Encounter (Signed)
Last filled 06/23/20 appt on 04/14/21

## 2021-03-29 ENCOUNTER — Ambulatory Visit (INDEPENDENT_AMBULATORY_CARE_PROVIDER_SITE_OTHER): Payer: Medicare Other | Admitting: Family Medicine

## 2021-03-31 DIAGNOSIS — E1065 Type 1 diabetes mellitus with hyperglycemia: Secondary | ICD-10-CM | POA: Diagnosis not present

## 2021-03-31 DIAGNOSIS — E1040 Type 1 diabetes mellitus with diabetic neuropathy, unspecified: Secondary | ICD-10-CM | POA: Diagnosis not present

## 2021-04-04 ENCOUNTER — Other Ambulatory Visit: Payer: Self-pay

## 2021-04-04 ENCOUNTER — Ambulatory Visit (INDEPENDENT_AMBULATORY_CARE_PROVIDER_SITE_OTHER): Payer: Medicare Other | Admitting: Family Medicine

## 2021-04-04 ENCOUNTER — Encounter: Payer: Self-pay | Admitting: Orthopedic Surgery

## 2021-04-04 ENCOUNTER — Encounter (INDEPENDENT_AMBULATORY_CARE_PROVIDER_SITE_OTHER): Payer: Self-pay | Admitting: Family Medicine

## 2021-04-04 ENCOUNTER — Ambulatory Visit (INDEPENDENT_AMBULATORY_CARE_PROVIDER_SITE_OTHER): Payer: Medicare Other | Admitting: Orthopedic Surgery

## 2021-04-04 VITALS — BP 126/83 | HR 90 | Temp 98.2°F | Ht 66.0 in | Wt 206.0 lb

## 2021-04-04 DIAGNOSIS — E669 Obesity, unspecified: Secondary | ICD-10-CM | POA: Diagnosis not present

## 2021-04-04 DIAGNOSIS — N1832 Chronic kidney disease, stage 3b: Secondary | ICD-10-CM | POA: Diagnosis not present

## 2021-04-04 DIAGNOSIS — R202 Paresthesia of skin: Secondary | ICD-10-CM | POA: Diagnosis not present

## 2021-04-04 DIAGNOSIS — Z79899 Other long term (current) drug therapy: Secondary | ICD-10-CM | POA: Diagnosis not present

## 2021-04-04 DIAGNOSIS — E559 Vitamin D deficiency, unspecified: Secondary | ICD-10-CM | POA: Diagnosis not present

## 2021-04-04 DIAGNOSIS — E1021 Type 1 diabetes mellitus with diabetic nephropathy: Secondary | ICD-10-CM | POA: Diagnosis not present

## 2021-04-04 DIAGNOSIS — R200 Anesthesia of skin: Secondary | ICD-10-CM | POA: Diagnosis not present

## 2021-04-04 DIAGNOSIS — Z6832 Body mass index (BMI) 32.0-32.9, adult: Secondary | ICD-10-CM | POA: Diagnosis not present

## 2021-04-04 DIAGNOSIS — R632 Polyphagia: Secondary | ICD-10-CM

## 2021-04-04 DIAGNOSIS — E1022 Type 1 diabetes mellitus with diabetic chronic kidney disease: Secondary | ICD-10-CM

## 2021-04-04 NOTE — Progress Notes (Signed)
Chief Complaint:   OBESITY [name] is here to discuss her progress with her obesity treatment plan along with follow-up of her obesity related diagnoses. See Medical Weight Management Flowsheet for complete bioelectrical impedance results.  Today's visit was #: 48 Starting weight: 199 lbs Starting date: 11/13/2019 Today's weight: 206 lbs Today's date: 04/04/2021 Weight change since last visit: +11 lbs Total lbs lost to date: +7 lbs Body mass index is 33.25 kg/m.   Interim History:  Maebell's A1c was 9.2 on 03/17/2021 at [facility], Dr. Buddy Duty.  She last saw Cardiology on 12/03/2020.  She asks about trial of phentermine. She says she has not taken her fluid pill this week.  She endorses polyphagia.  Creatinine 1.58.  She has not been taking Wellbutrin.  Coronary Angiography (11/17/20) 1. Left Main - normal  2. Left anterior descending artery -there is a 10 to 20% mid LAD stenosis. There is 1 large diagonal artery which is widely patent. 3. Left Circumflex - normal.  There is 1 moderate sized obtuse marginal artery that bifurcates that is widely patent.  4. Right Coronary Artery -normal.  The PDA and PLV branches are normal.  Hemodynamics  1. Aortic Pressure -121/86 mmHg  2. Left Ventricular -120/10 mmHg   Nutrition Plan: keeping a food journal and adhering to recommended goals of 1500 calories and 95 grams of protein for 50% of the time. Activity:  Walking 7,000 steps 5 times per week.  Assessment/Plan:   Orders Placed This Encounter  Procedures   CBC with Differential/Platelet   Comprehensive metabolic panel   Lipid panel   Vitamin B12   VITAMIN D 25 Hydroxy (Vit-D Deficiency, Fractures)   TSH   T4, free   Hemoglobin A1c   1. Polyphagia Not at goal. Current treatment: None. Polyphagia refers to excessive feelings of hunger. She will continue to focus on protein-rich, low simple carbohydrate foods. We reviewed the importance of hydration, regular exercise for stress reduction, and  restorative sleep.  Plan:  Check labs today.  Will start phentermine 37.5 mg daily.    I have consulted the [place] Controlled Substances Registry for this patient, and feel the risk/benefit ratio today is favorable for proceeding with this prescription for a controlled substance. The patient understands monitoring parameters and red flags.   We reviewed potential side effects including insomnia, dry mouth, increased heart rate and blood pressure, and increased anxiety. We reviewed reducing caffeine consumption while taking phentermine, especially if the patient is experiencing side effects. Alternative treatment options were discussed. All questions were answered, and the patient wishes to move forward with this medication.  - Start phentermine (ADIPEX-P) 37.5 MG tablet; Take 1 tablet (37.5 mg total) by mouth daily before breakfast. Take 1/2 tab daily.  Dispense: 15 tablet; Refill: 0  2. Vitamin D deficiency Not at goal.   Plan: Continue to take prescription Vitamin D @50 ,000 IU every week as prescribed.  Will check labs today.  Lab Results  Component Value Date   VD25OH 39.4 04/04/2021   VD25OH 32.0 06/30/2020   VD25OH 40.5 03/01/2020   3. 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 Medication: Insulin pump. Issues reviewed: blood sugar goals, complications of diabetes mellitus, hypoglycemia prevention and treatment, exercise, and nutrition.   Plan: The patient will continue to focus on protein-rich, low simple carbohydrate foods. We reviewed the importance of hydration, regular exercise for stress reduction, and restorative sleep.  Will check labs today.  Lab  Results  Component Value Date   HGBA1C 9.2 03/17/2021   HGBA1C 9.8 (H) 07/29/2020   HGBA1C 9.5 (H) 04/15/2020   Lab Results  Component Value Date   LDLCALC 91 04/04/2021   CREATININE 1.25 (H) 04/04/2021   4. Medication management Will check labs today, as per below.  -  CBC with Differential/Platelet - Comprehensive metabolic panel - Lipid panel - Vitamin B12 - VITAMIN D 25 Hydroxy (Vit-D Deficiency, Fractures) - TSH - T4, free  5. Obesity, current BMI 33.2  Course: Sharmane is currently in the action stage of change. As such, her goal is to continue with weight loss efforts.   Nutrition goals: She has agreed to keeping a food journal and adhering to recommended goals of 1500 calories and 95 protein.   Exercise goals:  As is.  Behavioral modification strategies: increasing lean protein intake, decreasing simple carbohydrates, increasing vegetables, and increasing water intake.  Loriana has agreed to follow-up with our clinic in 3 weeks. She was informed of the importance of frequent follow-up visits to maximize her success with intensive lifestyle modifications for her multiple health conditions.   Aayliah was informed we would discuss her lab results at her next visit unless there is a critical issue that needs to be addressed sooner. Paislee agreed to keep her next visit at the agreed upon time to discuss these results.  Objective:   Blood pressure 126/83, pulse 90, temperature 98.2 F (36.8 C), temperature source Oral, height 5\' 6"  (1.676 m), weight 206 lb (93.4 kg), SpO2 99 %. Body mass index is 33.25 kg/m.  General: Cooperative, alert, well developed, in no acute distress. HEENT: Conjunctivae and lids unremarkable. Cardiovascular: Regular rhythm.  Lungs: Normal work of breathing. Neurologic: No focal deficits.   Lab Results  Component Value Date   CREATININE 1.25 (H) 04/04/2021   BUN 18 04/04/2021   NA 137 04/04/2021   K 4.2 04/04/2021   CL 99 04/04/2021   CO2 21 04/04/2021   Lab Results  Component Value Date   ALT 16 04/04/2021   AST 18 04/04/2021   ALKPHOS 155 (H) 04/04/2021   BILITOT 0.4 04/04/2021   Lab Results  Component Value Date   HGBA1C 9.2 03/17/2021   HGBA1C 9.8 (H) 07/29/2020   HGBA1C 9.5 (H) 04/15/2020    HGBA1C 8.0 (H) 09/09/2019   HGBA1C 9.0 04/05/2017   Lab Results  Component Value Date   TSH 1.650 04/04/2021   Lab Results  Component Value Date   CHOL 200 (H) 04/04/2021   HDL 102 04/04/2021   LDLCALC 91 04/04/2021   TRIG 36 04/04/2021   CHOLHDL 2.0 04/04/2021   Lab Results  Component Value Date   WBC 7.6 04/04/2021   HGB 15.2 04/04/2021   HCT 45.8 04/04/2021   MCV 87 04/04/2021   PLT 287 04/04/2021   Lab Results  Component Value Date   IRON 41 11/13/2019   TIBC 267 11/13/2019   FERRITIN 77 11/13/2019   Obesity Behavioral Intervention:   Approximately 15 minutes were spent on the discussion below.  ASK: We discussed the diagnosis of obesity with Luciann today and Karema agreed to give [name] permission to discuss obesity behavioral modification therapy today.  ASSESS: Loralie has the diagnosis of obesity and her BMI today is 33.2. Dorreen is in the action stage of change.   ADVISE: Britzy was educated on the multiple health risks of obesity as well as the benefit of weight loss to improve her health. She was advised of the  need for long term treatment and the importance of lifestyle modifications to improve her current health and to decrease her risk of future health problems.  AGREE: Multiple dietary modification options and treatment options were discussed and Cielo agreed to follow the recommendations documented in the above note.  ARRANGE: Laterria was educated on the importance of frequent visits to treat obesity as outlined per CMS and USPSTF guidelines and agreed to schedule her next follow up appointment today.  Attestation Statements:   Reviewed by clinician on day of visit: allergies, medications, problem list, medical history, surgical history, family history, social history, and previous encounter notes.  I, Water quality scientist, CMA, am acting as transcriptionist for Briscoe Deutscher, DO  I have reviewed the above documentation for accuracy and completeness, and  I agree with the above. Briscoe Deutscher, DO

## 2021-04-04 NOTE — Progress Notes (Signed)
Office Visit Note   Patient: [name]           Date of Birth: 1986/03/18           MRN: [id number] Visit Date: 04/04/2021 Requested by: Dede Query, PA-C [id number] [place],  [place] [address] PCP: Dede Query, PA-C  Subjective: Chief Complaint  Patient presents with   Right Wrist - Pain    HPI: [name] is a 35 y.o. female who presents to the office complaining of right wrist pain.  Patient complains of 2 months of worsening numbness and tingling and pain in her right wrist and hand.  She complains it becomes numb with any activity.  She localizes the numbness and tingling to the palmar side of all 5 digits.  She complains of decreased strength and dropping objects.  She has to wake up pretty much every night with the numbness and tingling and has to shake her hand out in order to help with the symptoms.  She is a prior Psychologist, sport and exercise who is now on disability for bipolar disorder and history of multiple eye surgeries.  She also has a history of heart catheterization in February 2022.  Currently on amlodipine.  She also has CKD 3.  She denies any neck symptoms but does note some scapular pain between her shoulder blades on occasion but nothing consistent.  She has history of left carpal tunnel release by Dr. Aline Brochure in 2020.  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  She has history of type 1 diabetes with last A1c 9.0.  No history of thyroid disease..                ROS: All systems reviewed are negative as they relate to the chief complaint within the history of present illness.  Patient denies fevers or chills.  Assessment & Plan: Visit Diagnoses:  1. Numbness and tingling in right hand     Plan: Patient is a 35 year old female who presents complaining of right hand numbness and tingling.  She has history of left carpal tunnel syndrome that she had  surgery on by Dr. Aline Brochure in 2020 with good result.  She has several years of symptoms that have been worse in the last 2 months to the point that she is waking up with every night with numbness and tingling.  Symptoms are in the median nerve and ulnar nerve distributions.  Plan to order nerve conduction study of the right upper extremity for further evaluation.  No neck symptoms.  She also reports left trigger thumb that she has had 1 prior injection for.  It has returned in the last 2 to 3 weeks.  She wanted an injection but need to hold off due to her recent A1c of 9.0.  Recommended she improve her glycemic control if she wants to pursue surgery eventually.  Patient or stands and agrees with plan.  Follow-up after nerve conduction study to review results.  Follow-Up Instructions: No follow-ups on file.   Orders:  Orders Placed This Encounter  Procedures   Ambulatory referral to Physical Medicine Rehab   No orders of the defined types were placed in this encounter.     Procedures: No procedures performed   Clinical Data: No additional findings.  Objective: Vital Signs: There were no vitals taken for this visit.  Physical Exam:  Constitutional:  Patient appears well-developed HEENT:  Head: Normocephalic Eyes:EOM are normal Neck: Normal range of motion Cardiovascular: Normal rate Pulmonary/chest: Effort normal Neurologic: Patient is alert Skin: Skin is warm Psychiatric: Patient has normal mood and affect  Ortho Exam: Ortho exam demonstrates no muscle wasting of the right hand.  Excellent strength of finger abduction, adduction, EPL, wrist extension, thumb abduction.  Positive Tinel sign over the wrist, Durkan sign, Phalen sign.  No subluxing ulnar nerve.  Positive Tinel sign over the elbow.  Positive elbow flexion test.  Negative Froment's sign.  Specialty Comments:  No specialty comments available.  Imaging: No results found.   PMFS History: Patient Active Problem List    Diagnosis Date Noted   Iron deficiency anemia 12/19/2020   Long term (current) use of insulin ([place]) 12/19/2020   Migraine 11/15/2020   CKD stage 3 secondary to diabetes ([place]) 11/15/2020   Diabetic neuropathy ([place]) 11/15/2020   Type 1 diabetes mellitus with stage 3b chronic kidney disease ([place]) 08/27/2020   Polyphagia 08/27/2020   Bipolar 1 disorder, mixed, severe ([place]) 08/27/2020   Type 1 diabetes mellitus with hyperglycemia ([place]) 08/27/2020   Financial difficulties 08/27/2020   Drug-induced weight gain 08/27/2020   Traction detachment of right retina 08/27/2020   Vitreous hemorrhage of left eye ([place]) 08/27/2020   Hypertension associated with diabetes ([place]) 08/27/2020   Type 1 diabetes mellitus with diabetic polyneuropathy 08/27/2020   Class 1 obesity with serious comorbidity and body mass index (BMI) of 33.0 to 33.9 in adult 08/27/2020   Status post eye surgery 07/01/2020   Chronic migraine without aura 01/22/2020   S/P eye surgery 08/29/2019   Diplopia 07/23/2019   Epiretinal membrane (ERM) of left eye 07/23/2019   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/2019   Pseudophakia of left eye 11/04/2018   Chronic kidney disease, stage 3b ([place]) 01/01/2017   Diabetic nephropathy associated with type 1 diabetes mellitus ([place]) 03/01/2016   Drug overdose, intentional ([place]) 03/27/2014   Asthma 11/10/2013   MDD (major depressive disorder), recurrent episode, severe ([place]) 09/26/2013   Generalized anxiety disorder 09/26/2013   Aphakia, right eye 09/14/2012   Retinal detachment, tractional, left eye 06/11/2012   Eczema 06/07/2012   Diabetic retinopathy associated with type 1 diabetes mellitus ([place]) 04/30/2012   H/O insertion of insulin pump 04/30/2012   History of atrial tachycardia 08/08/2010   Gastroparesis due to DM ([place]) 08/08/2010   Past Medical History:  Diagnosis Date   Anemia    Anemia of chronic disease 06/07/2012   Formatting of this note  might be different from the original. Hematology consultation 07-13-16 (see note)--> Anemia of chronic disease Required 3 transfusions during pregnancy  Last Assessment & Plan:  Formatting of this note might be different from the original. She has now required 3 blood transfusions this pregnancy for her anemia of chronic disease. The most recent was one week ago when she bec   Anxiety    Asthma    Asthma 11/10/2013   Back pain    Bipolar 1 disorder (HCC)    Chest pain    Chronic renal failure syndrome, stage 3 (moderate) ([place])    Colles' fracture of left radius 12/12/5730   Complication of anesthesia    Constipation    Depression    Depression    Diabetic gastroparesis (HCC)    Diabetic neuropathy, type I diabetes mellitus ([place])    Diabetic ulcer of right great toe ([place]) 08/27/2020   Edema, lower extremity  Gallbladder disease    Gastroparesis    GERD (gastroesophageal reflux disease)    Headache(784.0)    Hypertension    Joint pain    Kidney stones    Palpitations    Polyneuropathy in diabetes(357.2)    PONV (postoperative nausea and vomiting)    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[place])    S/P carpal tunnel release left 10/16/19 11/04/2019   Shortness of breath    Tachycardia    baseline tachycardia    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[place])     Family History  Problem Relation Age of Onset   Hypertension Other    Diabetes Mother        type 2   Hypertension Mother    Obesity Mother    Diabetes Brother        type 1   Renal Disease Brother        renal failure   Hypertension Brother    Diabetes Father    Hypertension Father    High Cholesterol Father    Sleep apnea Father    Obesity Father    Heart disease Father    Breast cancer Maternal Aunt 64   Colon cancer Maternal Aunt    Uterine cancer Maternal Aunt 65       same as breast cancer   Uterine cancer Maternal Aunt 61   Rectal cancer Neg Hx    Esophageal cancer Neg Hx      Past Surgical History:  Procedure Laterality Date   ANAL RECTAL MANOMETRY N/A 03/25/2018   Procedure: ANO RECTAL MANOMETRY;  Surgeon: Doran Stabler, MD;  Location: [facility];  Service: Gastroenterology;  Laterality: N/A;   CARPAL TUNNEL RELEASE Left 10/16/2019   Procedure: LEFT CARPAL TUNNEL RELEASE;  Surgeon: Carole Civil, MD;  Location: [facility];  Service: Orthopedics;  Laterality: Left;   CESAREAN SECTION     x 2   CHOLECYSTECTOMY     EYE SURGERY     REFRACTIVE SURGERY Bilateral    Social History   Occupational History   Occupation: Home maker  Tobacco Use   Smoking status: Never   Smokeless tobacco: Never  Vaping Use   Vaping Use: Never used  Substance and Sexual Activity   Alcohol use: No   Drug use: No   Sexual activity: Yes    Partners: Male    Birth control/protection: I.U.D.

## 2021-04-05 ENCOUNTER — Encounter (INDEPENDENT_AMBULATORY_CARE_PROVIDER_SITE_OTHER): Payer: Self-pay | Admitting: Family Medicine

## 2021-04-05 LAB — COMPREHENSIVE METABOLIC PANEL
ALT: 16 IU/L (ref 0–32)
AST: 18 IU/L (ref 0–40)
Albumin/Globulin Ratio: 1.7 (ref 1.2–2.2)
Albumin: 4.5 g/dL (ref 3.8–4.8)
Alkaline Phosphatase: 155 IU/L — ABNORMAL HIGH (ref 44–121)
BUN/Creatinine Ratio: 14 (ref 9–23)
BUN: 18 mg/dL (ref 6–20)
Bilirubin Total: 0.4 mg/dL (ref 0.0–1.2)
CO2: 21 mmol/L (ref 20–29)
Calcium: 9.6 mg/dL (ref 8.7–10.2)
Chloride: 99 mmol/L (ref 96–106)
Creatinine, Ser: 1.25 mg/dL — ABNORMAL HIGH (ref 0.57–1.00)
Globulin, Total: 2.6 g/dL (ref 1.5–4.5)
Glucose: 122 mg/dL — ABNORMAL HIGH (ref 65–99)
Potassium: 4.2 mmol/L (ref 3.5–5.2)
Sodium: 137 mmol/L (ref 134–144)
Total Protein: 7.1 g/dL (ref 6.0–8.5)
eGFR: 57 mL/min/{1.73_m2} — ABNORMAL LOW (ref >59)

## 2021-04-05 LAB — LIPID PANEL
Chol/HDL Ratio: 2 ratio (ref 0.0–4.4)
Cholesterol, Total: 200 mg/dL — ABNORMAL HIGH (ref 100–199)
HDL: 102 mg/dL (ref >39)
LDL Chol Calc (NIH): 91 mg/dL (ref 0–99)
Triglycerides: 36 mg/dL (ref 0–149)
VLDL Cholesterol Cal: 7 mg/dL (ref 5–40)

## 2021-04-05 LAB — CBC WITH DIFFERENTIAL/PLATELET
Basophils Absolute: 0.1 10*3/uL (ref 0.0–0.2)
Basos: 1 %
EOS (ABSOLUTE): 0.3 10*3/uL (ref 0.0–0.4)
Eos: 4 %
Hematocrit: 45.8 % (ref 34.0–46.6)
Hemoglobin: 15.2 g/dL (ref 11.1–15.9)
Immature Grans (Abs): 0 10*3/uL (ref 0.0–0.1)
Immature Granulocytes: 0 %
Lymphocytes Absolute: 2.4 10*3/uL (ref 0.7–3.1)
Lymphs: 32 %
MCH: 28.7 pg (ref 26.6–33.0)
MCHC: 33.2 g/dL (ref 31.5–35.7)
MCV: 87 fL (ref 79–97)
Monocytes Absolute: 0.5 10*3/uL (ref 0.1–0.9)
Monocytes: 6 %
Neutrophils Absolute: 4.3 10*3/uL (ref 1.4–7.0)
Neutrophils: 57 %
Platelets: 287 10*3/uL (ref 150–450)
RBC: 5.29 x10E6/uL — ABNORMAL HIGH (ref 3.77–5.28)
RDW: 12.4 % (ref 11.7–15.4)
WBC: 7.6 10*3/uL (ref 3.4–10.8)

## 2021-04-05 LAB — VITAMIN D 25 HYDROXY (VIT D DEFICIENCY, FRACTURES): Vit D, 25-Hydroxy: 39.4 ng/mL (ref 30.0–100.0)

## 2021-04-05 LAB — T4, FREE: Free T4: 1.17 ng/dL (ref 0.82–1.77)

## 2021-04-05 LAB — VITAMIN B12: Vitamin B-12: 856 pg/mL (ref 232–1245)

## 2021-04-05 LAB — TSH: TSH: 1.65 u[IU]/mL (ref 0.450–4.500)

## 2021-04-05 MED ORDER — PHENTERMINE HCL 37.5 MG PO TABS: 37.5000 mg | ORAL_TABLET | Freq: Every day | ORAL | 0 refills | Status: DC

## 2021-04-14 ENCOUNTER — Ambulatory Visit (INDEPENDENT_AMBULATORY_CARE_PROVIDER_SITE_OTHER): Payer: Medicare Other | Admitting: Psychiatry

## 2021-04-14 ENCOUNTER — Other Ambulatory Visit: Payer: Self-pay

## 2021-04-14 ENCOUNTER — Encounter: Payer: Self-pay | Admitting: Psychiatry

## 2021-04-14 DIAGNOSIS — F411 Generalized anxiety disorder: Secondary | ICD-10-CM

## 2021-04-14 DIAGNOSIS — F3162 Bipolar disorder, current episode mixed, moderate: Secondary | ICD-10-CM

## 2021-04-14 DIAGNOSIS — F4001 Agoraphobia with panic disorder: Secondary | ICD-10-CM | POA: Diagnosis not present

## 2021-04-14 DIAGNOSIS — G2581 Restless legs syndrome: Secondary | ICD-10-CM | POA: Diagnosis not present

## 2021-04-14 DIAGNOSIS — F5105 Insomnia due to other mental disorder: Secondary | ICD-10-CM

## 2021-04-14 MED ORDER — CARIPRAZINE HCL 3 MG PO CAPS: 3.0000 mg | ORAL_CAPSULE | Freq: Every day | ORAL | 2 refills | Status: DC

## 2021-04-14 NOTE — Progress Notes (Signed)
[name] [id number] 23-Apr-1986 34 y.o.    Subjective:   Patient ID:  [name] is a 35 y.o. (DOB Mar 15, 1986) female.  Chief Complaint:  Chief Complaint  Patient presents with   Follow-up   Bipolar 1 disorder, mixed, severe   Depression   Anxiety    Depression        Associated symptoms include appetite change. [name] presents to the office today for follow-up of bipolar disorder and generalized anxiety disorder. Historically seen with [name].  When seen June 05, 2019.  The following changes were made: Change pramipexole ER and increase to 0.75 mg pm to rid nausea and improve mood in the afternoon.  For tiredness in the afternoon switch Equetro to 400 mg AM , 1@ 6 and 1 at 11. For panic in daytime start gabapentin 300 mg each am as preventative.  She's only taking it prn now. We continued unchanged Latuda 120 mg and lamotrigine 300 mg twice daily for bipolar disorder. She just got the ER pramipexole about a week ago.  Can't tell difference with nausea DT  gastroparesis worse lately with constipation Less afternoon tiredness with current split Equetro dose.    seen July 22, 2019.  Meds were not changed at that visit.  She called back to October 20 wanting to try valproate because of racing thoughts and difficulty sleeping.  Stating the trazodone was not working.  She was told it was okay to start Depakote ER 500 mg nightly and then gradually increased to 1500 mg nightly.  She called back again on October 6 and this physician noted the following: RTC  Pt called to report new med she started having a lot of side effects mood swings, sounds in ears, suicidal thoughts, tremors. Causing issues with husband She is currently taking Depakote ER 1500 mg nightly. She commits to safety.  The only symptom directly attributable to Depakote would be the tremor.  However its presence would indicate she is not going to be able to tolerate a higher dose of Depakote to  control her mood symptoms.  Therefore we will wean the Depakote.  In order to control the mood symptoms we will increase the Equetro at the evening dose which should not cause significant side effects problems during the day but help with mood stability.  This increase should also make it easier to wean the Depakote quickly.  She agrees to this plan.  She will call back if the suicidal thoughts become too intense to tolerate.  Reduce Depakote ER 500 mg tablets to 1 a night for 4 nights then stop Increase Equetro 200 mg capsules to 2 in the morning and 3 nightly.  Cristina Parents, MD, DFAPA  She's been back on Equetro and doing OK. 2 weeks leading to Xmas with a lot of anger and lashing out a good bit.  Had run out of mirapex and Latuda for a couple of days in that time frame.  Crying is less now.  No SE CBZ now.  A lot of migraines off her Trokendi bc risk kidney stones.  Plans Botox for HA.  Usually sleeping OK without trazodone.   Still taking Latuda, buspirone, gabapentin, lamotrigine 300 mg BID.    seen 12.30.21 without med change.    12/17/19 appt with the following noted: Since then increased gabapentin for anxiety to 600 BID-TID which helped but caused some tiredness.  Needing trazodone more bc EMA/EFA.  She's stopped caffeine.  Mood stability is getting better despite a lot  of stress. $ stress and racing thoughts at night and scattered some with stressors.  Depression is mild lately.  Took trip with [name] last week helped.  Coming up on 2 year anniversary of [name]'s death.  [name] diabetic died last year with DM and kidney failure. Not as much anger irritability.  Some mood swings early FEB but better now. Custody battle with [name].  He's nicer to her now.   Weight going up. Hates weight gain effects of meds.    Consistent with meds.  No sig SI other than fleeting rarely.  Scattered concentration and racing thoughts randomly.   Sleep OK. Plan: No med changes/ FU 4 mos  06/18/20 TC saying she stopped all psych  meds end of August.  06/22/20 appt with the following noted: Had gradually dropped off meds and not sure why.  Stoped Latuda, buspirone, CBZ. Pramipexole., and lamotrigine.  Sleep Ok without trazodone.  Gabapentin less tolerated as prn. Stopped Equetro over a month ago DT insurance.  Back on regular medicare and it might be covered. A lot of stress here lately. Lost father 01/01/20 of MI.  since here and called got propranolol and it hleped crying.  Also got some clonazepam.   Had given up on life and everything.  Not SI today but has been.  No plan.  Depression and mood swings including anger.  Anxiety. She and therapist say she's depressed and recommended IOP at [facility]. Agrees to retry Seroquel which helped in the past Plan: Acute decompensation because of psychiatric noncompliance. quetiapine ER 1 at night for 4 nights, then 2 at night for 4 nights, then 3 at night.  07/13/2020 appointment with the following noted: Patient called in between appointments complaining of quetiapine call causing restless legs.  Gabapentin was called in with instructions to make this side effect resolve. Misunderstood and only taking 300 mg Seroquel XR for a couple of days. Referred to [facility] and has continued therapy. Feels better on Seroquel.  No longer SI.  More stable.  Less anger.  Depression down to mild generally.  Fewer panic attacks. Sleeps with Seroquel 8 hours.  RLS is managed fairly well at this time. Initially too tired with Seroquel and better now. Clonazepam once in 2 weeks for panic driving. Referred to IOP and continued Seroquel ER 600 HS  08/10/20 appt with the following noted: Insurance wouldn't cover enough of IOP.  Still seeing therapist every 2 weeks. Consistent with Seroquel XR 600 daily and needs gabapentin with it DT RLS. No longer taking propranolol. Taking clonazepam only twice in last month. I feel pretty stable right now.  First Xmas without father.  Feels better than mos  ago.  More stable.  Sleep good.  Hangover if late with Seroquel.  Not unusually angry, under control.  Depression is better.  Occ down day.  Function is pretty good with routine things.   Energy is not great esp in the AM Gabapentin will make her feel too drunk if dose is too high. SE Seroquel makes her hungry. Plan: no med changes  09/09/2020 appointment with the following noted: Gabapentin manages the RLS. Compliant with Seroquel XR 600 mg HS. Kind of stressed out and sort of moody.  Mild mood swings with some on edge and agitated easily.  No SI. Kind of down over weight management visit and lack of progress.  No energy to want to do anything.  Sleep is fine with Seroquel.  In bed at least 8 hours daily. Used Klonopin  twice since last visit.  Triggered panic attack last week. Back on Reglan for 2 mos. Plan: Prescription given with schedule to increase to the target dose of Wellbutrin SR 100 mg tablets 2 twice daily and topiramate 25 mg tablets 2 twice daily both to help with weight management and energy and mild depression.  12/16/2020 phone call wanting to stop Seroquel.  02/10/2021 appointment with the following noted: Missed appt here.  She stopped Seroquel on her own gradually and completely about a month ago.  Was taking it off and on.  The gabapentin was interfering with her ability to function.  Wanting to lay down.  But had to take the gabapentin bc Seroquel giving her RLS.  Still very moody but not over the edge like she was in the past. Clonazepam once or twice weekly.  Stopped gabapentin bc no longer has RLS off the Seroquel. No trouble sleeping usually. Feels agitated but not manic.  A lot of stress at home. Lives in [place] beside in-laws with conflict over $ with them.  A lot of stress right now and hurt and anger with them over things. Not many friends to talk to and not a lot of conversation with [name].  Feels like needs to restart therapy. Doing video therapy with Christina  Hussami. Wellbutrin seemed to help mental state some but didn't help weight much. Plan: Consider retrying Vraylar bc weight gain.  Yes Start 1.5 mg for 1 week then 3 mg daily.  04/14/2021 appointment with the following noted: Seems to be working good with Vraylar 1.5 mg and stopped Wellbutrin. No SE. She started phentermine from Dr. Juleen China with Topomax. No SE mania and no sig benefit at 1.2 tablet.  No SE. Sleep normal and not manic. Depression is better with Vraylar but down a couple of times weekly and still a lot of stress at home which affects mood. Mo in law is source of stress bc lives in camper on her property.  She hates me. Won't allow her to use washer.  She's the reason pt needs clonazepam. $ stress.  Ran out of food stamps.  All 5 kids under their care for the summer.  She cooks for M in [employer] but won't help with cost. Disc D's psych provblems  Remarried April 2020.  35 yo D ADD is stressful and disrespectful. Disability review approved mostly for psych reasons.  Going to [facility] weight [facility] and just started vitamin D. Level 24.5.  Past Psychiatric Medication Trials:  lamotrigine 300 twice daily, Equetro 900 daily,  Depakote 1500 SE,  History topiramate for migraine Vraylar 3,  Abilify 15,  lithium felt slowed down,  Seroquel worked but stopped DT insurance and SE RLS and hunger,  Latuda 120, sertraline, duloxetine,  gabapentin 600 mg 3 times daily tiredness,  buspirone 30 twice daily,,  clonazepam, trazodone, Xanax , hydroxyzine NR,  History of suicidal ideation and about 5 suicide attempts with about 4 psychiatric hospitalizations  Review of Systems:  Review of Systems  Constitutional:  Positive for appetite change and unexpected weight change.  Cardiovascular:  Negative for palpitations.  Gastrointestinal:  Negative for nausea and vomiting.  Musculoskeletal:  Positive for arthralgias.  Neurological:  Negative for tremors and weakness.  Psychiatric/Behavioral:   Negative for sleep disturbance. The patient is nervous/anxious.    Medications: I have reviewed the patient's current medications.  Current Outpatient Medications  Medication Sig Dispense Refill   albuterol (PROVENTIL) (2.5 MG/3ML) 0.083% nebulizer solution Take 3 mLs (2.5 mg total) by  nebulization every 6 (six) hours as needed for wheezing or shortness of breath. 150 mL 1   albuterol (VENTOLIN HFA) 108 (90 Base) MCG/ACT inhaler TAKE 2 PUFFS BY MOUTH EVERY 6 HOURS AS NEEDED FOR WHEEZE OR SHORTNESS OF BREATH 18 each 2   amLODipine (NORVASC) 2.5 MG tablet Take 2.5 mg by mouth daily.     aspirin 81 MG chewable tablet Chew by mouth.     budesonide-formoterol (SYMBICORT) 80-4.5 MCG/ACT inhaler Inhale 2 puffs into the lungs 2 (two) times daily. RINSE MOUTH WITH WATER AND SPIT AFTER EVERY USE 1 each 1   cariprazine (VRAYLAR) 3 MG capsule Take 1 capsule (3 mg total) by mouth daily. 30 capsule 1   clonazePAM (KLONOPIN) 0.5 MG tablet TAKE 1 TABLET (0.5 MG TOTAL) BY MOUTH EVERY 8 (EIGHT) HOURS AS NEEDED. FOR ANXIETY 20 tablet 0   fluticasone (FLONASE) 50 MCG/ACT nasal spray Place 2 sprays into both nostrils daily. (Patient taking differently: Place 2 sprays into both nostrils daily as needed (seasonal allergies.).) 16 g 2   furosemide (LASIX) 40 MG tablet Take 40 mg by mouth every other day.      GLUCAGON EMERGENCY 1 MG injection Inject 1 mg into the skin as needed (hypoglycemia.).      Insulin Human (INSULIN PUMP) SOLN Inject 1 each into the skin 3 times daily with meals, bedtime and 2 AM. Novolog: 36-38 units per day     Insulin Pen Needle 32G X 4 MM MISC 1 each by Does not apply route once a week. 50 each 0   levonorgestrel (MIRENA, 52 MG,) 20 MCG/24HR IUD 1 each by Intrauterine route once.     metoCLOPramide (REGLAN) 5 MG tablet TAKE 1 TABLET (5 MG TOTAL) BY MOUTH 3 (THREE) TIMES DAILY BEFORE MEALS. 90 tablet 0   montelukast (SINGULAIR) 10 MG tablet Take 1 tablet (10 mg total) by mouth at bedtime. 90  tablet 1   NOVOLOG 100 UNIT/ML injection Inject 36-38 Units into the skin daily. Via insulin pump per sliding scale     phentermine (ADIPEX-P) 37.5 MG tablet Take 1 tablet (37.5 mg total) by mouth daily before breakfast. Take 1/2 tab daily. 15 tablet 0   polyethylene glycol (MIRALAX / GLYCOLAX) 17 g packet Take 17 g by mouth 2 (two) times daily as needed (constipation). 14 each 0   pravastatin (PRAVACHOL) 20 MG tablet SMARTSIG:1 Tablet(s) By Mouth Every Evening     promethazine (PHENERGAN) 25 MG tablet Take 1 tablet (25 mg total) by mouth every 8 (eight) hours as needed for nausea or vomiting. 30 tablet 0   tiZANidine (ZANAFLEX) 4 MG tablet Take 4 mg by mouth 2 (two) times daily as needed for muscle spasms.      topiramate (TOPAMAX) 50 MG tablet Take 1.5 tablets (75 mg total) by mouth 2 (two) times daily. 270 tablet 0   Ubrogepant (UBRELVY) 100 MG TABS Take 100 mg by mouth daily as needed (migraine headaches.).      Vitamin D, Ergocalciferol, (DRISDOL) 1.25 MG (50000 UNIT) CAPS capsule Take 1 capsule (50,000 Units total) by mouth every 7 (seven) days. 4 capsule 0   No current facility-administered medications for this visit.    Medication Side Effects: tired in the afternoon.  Allergies:  Allergies  Allergen Reactions   Nsaids Other (See Comments)    Due to Renal disease   Other    Toradol [Ketorolac Tromethamine]     Cannot take d/t kidney issues    Ceftin Rash  Past Medical History:  Diagnosis Date   Anemia    Anemia of chronic disease 06/07/2012   Formatting of this note might be different from the original. Hematology consultation 07-13-16 (see note)--> Anemia of chronic disease Required 3 transfusions during pregnancy  Last Assessment & Plan:  Formatting of this note might be different from the original. She has now required 3 blood transfusions this pregnancy for her anemia of chronic disease. The most recent was one week ago when she bec   Anxiety    Asthma    Asthma  11/10/2013   Back pain    Bipolar 1 disorder (HCC)    Chest pain    Chronic renal failure syndrome, stage 3 (moderate) (HCC)    Colles' fracture of left radius 10/10/8784   Complication of anesthesia    Constipation    Depression    Depression    Diabetic gastroparesis (HCC)    Diabetic neuropathy, type I diabetes mellitus ([place])    Diabetic ulcer of right great toe ([place]) 08/27/2020   Edema, lower extremity    Gallbladder disease    Gastroparesis    GERD (gastroesophageal reflux disease)    Headache(784.0)    Hypertension    Joint pain    Kidney stones    Palpitations    Polyneuropathy in diabetes(357.2)    PONV (postoperative nausea and vomiting)    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[place])    S/P carpal tunnel release left 10/16/19 11/04/2019   Shortness of breath    Tachycardia    baseline tachycardia    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[place])     Family History  Problem Relation Age of Onset   Hypertension Other    Diabetes Mother        type 2   Hypertension Mother    Obesity Mother    Diabetes Brother        type 1   Renal Disease Brother        renal failure   Hypertension Brother    Diabetes Father    Hypertension Father    High Cholesterol Father    Sleep apnea Father    Obesity Father    Heart disease Father    Breast cancer Maternal Aunt 64   Colon cancer Maternal Aunt    Uterine cancer Maternal Aunt 65       same as breast cancer   Uterine cancer Maternal Aunt 61   Rectal cancer Neg Hx    Esophageal cancer Neg Hx     Social History   Socioeconomic History   Marital status: Married    Spouse name: Not on file   Number of children: 2   Years of education: 12   Highest education level: 12th grade  Occupational History   Occupation: Materials engineer  Tobacco Use   Smoking status: Never   Smokeless tobacco: Never  Vaping Use   Vaping Use: Never used  Substance and Sexual Activity   Alcohol use: No   Drug use:  No   Sexual activity: Yes    Partners: Male    Birth control/protection: I.U.D.  Other Topics Concern   Not on file  Social History Narrative   Pt has a daughter and boyfriend.    Dad passed away suddenly heart attack 12/2019   Social Determinants of Health   Financial Resource Strain: High Risk   Difficulty of Paying Living Expenses: Very hard  Food Insecurity: Food Insecurity Present  Worried About Charity fundraiser in the Last Year: Sometimes true   [employer] of Food in the Last Year: Sometimes true  Transportation Needs: No Transportation Needs   Lack of Transportation (Medical): No   Lack of Transportation (Non-Medical): No  Physical Activity: Inactive   Days of Exercise per Week: 0 days   Minutes of Exercise per Session: 0 min  Stress: Stress Concern Present   Feeling of Stress : Very much  Social Connections: Moderately Integrated   Frequency of Communication with Friends and Family: More than three times a week   Frequency of Social Gatherings with Friends and Family: More than three times a week   Attends Religious Services: More than 4 times per year   Active Member of [employer] or Organizations: No   Attends Archivist Meetings: Never   Marital Status: Living with partner  Intimate Partner Violence: Not At Risk   Fear of Current or Ex-Partner: No   Emotionally Abused: No   Physically Abused: No   Sexually Abused: No    Past Medical History, Surgical history, Social history, and Family history were reviewed and updated as appropriate.   Please see review of systems for further details on the patient's review from today.   Objective:   Physical Exam:  There were no vitals taken for this visit.  Physical Exam Constitutional:      General: She is not in acute distress.    Appearance: She is obese.  Musculoskeletal:        General: No deformity.  Neurological:     Mental Status: She is alert and oriented to person, place, and time.     Cranial  Nerves: No dysarthria.     Coordination: Coordination normal.  Psychiatric:        Attention and Perception: Attention and perception normal. She does not perceive auditory or visual hallucinations.        Mood and Affect: Mood is anxious and depressed. Affect is not labile, blunt, angry or inappropriate.        Speech: Speech normal.        Behavior: Behavior normal. Behavior is cooperative.        Thought Content: Thought content normal. Thought content is not paranoid or delusional. Thought content does not include homicidal or suicidal ideation. Thought content does not include homicidal or suicidal plan.        Cognition and Memory: Cognition and memory normal.        Judgment: Judgment normal.     Comments: Insight intact     Lab Review:     Component Value Date/Time   NA 137 04/04/2021 1250   K 4.2 04/04/2021 1250   CL 99 04/04/2021 1250   CO2 21 04/04/2021 1250   GLUCOSE 122 ([name]) 04/04/2021 1250   GLUCOSE 147 ([name]) 10/14/2019 0920   BUN 18 04/04/2021 1250   CREATININE 1.25 ([name]) 04/04/2021 1250   CREATININE 1.81 ([name]) 06/17/2018 1231   CALCIUM 9.6 04/04/2021 1250   PROT 7.1 04/04/2021 1250   ALBUMIN 4.5 04/04/2021 1250   AST 18 04/04/2021 1250   ALT 16 04/04/2021 1250   ALKPHOS 155 ([name]) 04/04/2021 1250   BILITOT 0.4 04/04/2021 1250   GFRNONAA 43 (L) 07/29/2020 1246   GFRNONAA 36 (L) 06/17/2018 1231   GFRAA 50 (L) 07/29/2020 1246   GFRAA 42 (L) 06/17/2018 1231       Component Value Date/Time   WBC 7.6 04/04/2021 1250   WBC 7.9 10/14/2019  0920   RBC 5.29 ([name]) 04/04/2021 1250   RBC 4.52 10/14/2019 0920   HGB 15.2 04/04/2021 1250   HCT 45.8 04/04/2021 1250   PLT 287 04/04/2021 1250   MCV 87 04/04/2021 1250   MCH 28.7 04/04/2021 1250   MCH 29.6 10/14/2019 0920   MCHC 33.2 04/04/2021 1250   MCHC 32.0 10/14/2019 0920   RDW 12.4 04/04/2021 1250   LYMPHSABS 2.4 04/04/2021 1250   MONOABS 0.8 10/14/2019 0920   EOSABS 0.3 04/04/2021 1250   BASOSABS 0.1 04/04/2021 1250     No results found for: POCLITH, LITHIUM   No results found for: PHENYTOIN, PHENOBARB, VALPROATE, CBMZ   .res Assessment: Plan:    Talena was seen today for follow-up, bipolar 1 disorder, mixed, severe, depression and anxiety.  Diagnoses and all orders for this visit:  Bipolar 1 disorder, mixed, moderate (HCC)  Generalized anxiety disorder  Panic disorder with agoraphobia  Restless leg syndrome, controlled  Insomnia due to mental condition   Greater than 50% of 30 min non face to face time with patient was spent on counseling and coordination of care. We discussed Patient with a history of multiple med failures and history of psychiatric hospitalization and suicide attempts recent worsening of bipolar depression.  Chronic stressors contribute to mood and anxiety problems.   Concern about potential weight gain with medications.  She has taken the major mood stabilizers with low weight gain potential. Discussed the pros and cons of switching mood stabilizers.  Could consider ultra low-dose lithium.   She recently failed to tolerate VPA.    Ok off Wellbutrin Disc risk Phentermine causing mania.  She understands.  Vraylar bc weight gain.  Continue 3 mg daily.  Discussed potential metabolic side effects associated with atypical antipsychotics, as well as potential risk for movement side effects. Advised pt to contact office if movement side effects occur.    RLS Not a problem now.  Follow-up 2 months  Cristina Parents, MD, DFAPA   Please see After Visit Summary for patient specific instructions.  Future Appointments  Date Time Provider [place]  04/27/2021  3:00 PM Hussami, [name], LCSW [facility] None  05/03/2021  2:00 PM Briscoe Deutscher, DO [facility] None  05/31/2021 11:00 AM Magnus Sinning, MD [facility] None    No orders of the defined types were placed in this encounter.   -------------------------------

## 2021-04-20 DIAGNOSIS — E1065 Type 1 diabetes mellitus with hyperglycemia: Secondary | ICD-10-CM | POA: Diagnosis not present

## 2021-04-20 DIAGNOSIS — E1040 Type 1 diabetes mellitus with diabetic neuropathy, unspecified: Secondary | ICD-10-CM | POA: Diagnosis not present

## 2021-04-21 ENCOUNTER — Other Ambulatory Visit (INDEPENDENT_AMBULATORY_CARE_PROVIDER_SITE_OTHER): Payer: Self-pay

## 2021-04-21 DIAGNOSIS — R632 Polyphagia: Secondary | ICD-10-CM

## 2021-04-26 MED ORDER — PHENTERMINE HCL 37.5 MG PO TABS: 37.5000 mg | ORAL_TABLET | Freq: Every day | ORAL | 0 refills | Status: DC

## 2021-04-27 ENCOUNTER — Ambulatory Visit (INDEPENDENT_AMBULATORY_CARE_PROVIDER_SITE_OTHER): Payer: Medicare Other | Admitting: Licensed Clinical Social Worker

## 2021-04-27 ENCOUNTER — Other Ambulatory Visit: Payer: Self-pay

## 2021-04-27 DIAGNOSIS — F411 Generalized anxiety disorder: Secondary | ICD-10-CM | POA: Diagnosis not present

## 2021-04-27 DIAGNOSIS — F3162 Bipolar disorder, current episode mixed, moderate: Secondary | ICD-10-CM

## 2021-04-27 NOTE — Progress Notes (Signed)
   THERAPIST PROGRESS NOTE  Session Time: 3:15-4p  Participation Level: Active  Behavioral Response: Neat and Well GroomedAlertAnxious  Type of Therapy: Individual Therapy  Treatment Goals addressed: Anxiety and Coping  Interventions: CBT and Supportive  Summary: Oliviarose Punch is a 35 y.o. female who presents with stable symptoms related to depression diagnosis. Patient reports that overall mood has been stable and that she is trying hard to manage external stressors and anxiety. Patient reports good quality and quantity of sleep. Patient reports that she is compliant with her medication, and her psychiatric visits. Allowed patient safe space to explore and express thoughts and feelings associated with recent external stressors and life events. Patient reports that she is continuing to have stress associated with being the primary caregiver to her children (2) and husband children (3) over the summer. Patient reports that one of husbands children has decided that they want to stay with them full time, so they are making some big life decisions about next steps. Patient reports concerns that she has about things going on with her children stepmother, and issues going on within the home of husband children biological mother. Discuss the possible need of patient making a Child Protective Services report, and patient got very upset and crying because she feels that if she makes a Child Protective Services report on ex husband's care, her ex-husband will retaliate and make a Child Protective Services report on her since she is currently living in a camper with her children. Allowed patient to brainstorm through different solutions, and encouraged patient to always focus on child safety as number one and anytime she feels the children are not safe to make the CPS call. Praised pts priorities--pt wants what is best for her children.  Continued recommendations are as follows: self care behaviors, positive  social engagements, focusing on overall work/home/life balance, and focusing on positive physical and emotional wellness. .   Suicidal/Homicidal: No  Therapist Response: Loistine is trying hard to utilize coping skills recommended to manage mood and anxiety fluctuations. Ani is able to identify life conflict from past and present and understand how they form present levels of anxiety. Oluwadamilola is able to verbalize an understanding of the role that fearful thinking plays in creating fears, excessive worry, and persistent anxiety symptoms. Kenidee is trying hard to engage in physical and recreational activities that reflect increased energy and interest. These behaviors are reflective of both personal growth and progress. Treatment to continue as indicated  Plan: Return again in 4 weeks.  Diagnosis: Axis I: Bipolar, mixed and Generalized Anxiety Disorder    Axis II: No diagnosis    [name] Garris Melhorn, LCSW 04/27/2021

## 2021-05-03 ENCOUNTER — Ambulatory Visit (INDEPENDENT_AMBULATORY_CARE_PROVIDER_SITE_OTHER): Payer: Medicare Other | Admitting: Family Medicine

## 2021-05-03 ENCOUNTER — Encounter (INDEPENDENT_AMBULATORY_CARE_PROVIDER_SITE_OTHER): Payer: Self-pay | Admitting: Family Medicine

## 2021-05-03 ENCOUNTER — Other Ambulatory Visit: Payer: Self-pay

## 2021-05-03 VITALS — BP 128/83 | HR 83 | Temp 98.4°F | Ht 66.0 in | Wt 207.0 lb

## 2021-05-03 DIAGNOSIS — E559 Vitamin D deficiency, unspecified: Secondary | ICD-10-CM

## 2021-05-03 DIAGNOSIS — E1022 Type 1 diabetes mellitus with diabetic chronic kidney disease: Secondary | ICD-10-CM | POA: Diagnosis not present

## 2021-05-03 DIAGNOSIS — E1065 Type 1 diabetes mellitus with hyperglycemia: Secondary | ICD-10-CM | POA: Diagnosis not present

## 2021-05-03 DIAGNOSIS — E1069 Type 1 diabetes mellitus with other specified complication: Secondary | ICD-10-CM | POA: Diagnosis not present

## 2021-05-03 DIAGNOSIS — Z6832 Body mass index (BMI) 32.0-32.9, adult: Secondary | ICD-10-CM

## 2021-05-03 DIAGNOSIS — E669 Obesity, unspecified: Secondary | ICD-10-CM

## 2021-05-03 DIAGNOSIS — E1040 Type 1 diabetes mellitus with diabetic neuropathy, unspecified: Secondary | ICD-10-CM | POA: Diagnosis not present

## 2021-05-03 DIAGNOSIS — N1832 Chronic kidney disease, stage 3b: Secondary | ICD-10-CM | POA: Diagnosis not present

## 2021-05-03 DIAGNOSIS — I10 Essential (primary) hypertension: Secondary | ICD-10-CM | POA: Diagnosis not present

## 2021-05-03 DIAGNOSIS — Z794 Long term (current) use of insulin: Secondary | ICD-10-CM | POA: Diagnosis not present

## 2021-05-03 DIAGNOSIS — R632 Polyphagia: Secondary | ICD-10-CM | POA: Diagnosis not present

## 2021-05-04 ENCOUNTER — Other Ambulatory Visit (INDEPENDENT_AMBULATORY_CARE_PROVIDER_SITE_OTHER): Payer: Self-pay | Admitting: Family Medicine

## 2021-05-04 DIAGNOSIS — E1069 Type 1 diabetes mellitus with other specified complication: Secondary | ICD-10-CM

## 2021-05-04 MED ORDER — TIRZEPATIDE 2.5 MG/0.5ML ~~LOC~~ SOAJ: 2.5000 mg | SUBCUTANEOUS | 0 refills | Status: DC

## 2021-05-04 MED ORDER — PHENTERMINE HCL 37.5 MG PO TABS: 37.5000 mg | ORAL_TABLET | Freq: Every day | ORAL | 0 refills | Status: DC

## 2021-05-04 NOTE — Telephone Encounter (Signed)
Pt last seen by Dr. Wallace.  

## 2021-05-05 ENCOUNTER — Encounter (INDEPENDENT_AMBULATORY_CARE_PROVIDER_SITE_OTHER): Payer: Self-pay

## 2021-05-05 NOTE — Telephone Encounter (Signed)
Prior authorization has been started. Waiting on response.

## 2021-05-09 NOTE — Progress Notes (Signed)
Chief Complaint:   OBESITY [name] is here to discuss her progress with her obesity treatment plan along with follow-up of her obesity related diagnoses.   Today's visit was #: 37 Starting weight: 199 lbs Starting date: 11/13/2019 Today's weight: 207 lbs Today's date: 05/03/2021 Weight change since last visit: +1 lb Total lbs lost to date: +8 lbs Body mass index is 33.41 kg/m.   Interim History: [name] has not started the whole tablet of phentermine yet.  She endorses polyphagia. Would like to try [place].  She has had increased stress at home.  Will be moving soon.  Current Meal Plan: keeping a food journal and adhering to recommended goals of 1500 calories and 95 grams of protein for 60% of the time.  Current Exercise Plan: Walking 5,000 steps 5-7 days per week. Current Anti-Obesity Medications: phentermine 18.75 mg daily. Side effects: None.  Assessment/Plan:   Meds ordered this encounter  Medications   phentermine (ADIPEX-P) 37.5 MG tablet    Sig: Take 1 tablet (37.5 mg total) by mouth daily before breakfast.    Dispense:  30 tablet    Refill:  0   tirzepatide (MOUNJARO) 2.5 MG/0.5ML Pen    Sig: Inject 2.5 mg into the skin once a week.    Dispense:  2 mL    Refill:  0    1. Type 1 diabetes mellitus with other specified complication (HCC) Diabetes Mellitus: Not at goal. Medication:  Insulin pump, Novolog. She has a history of gastroparesis, made worse with Ozempic previously. We discussed this at length. She would like to try [facility] and will stop immediately if gastroparesis symptoms return.  Plan:  Start Mounjaro 2.5 mg subcutaneously weekly. The patient will continue to focus on protein-rich, low simple carbohydrate foods. We reviewed the importance of hydration, regular exercise for stress reduction, and restorative sleep.   Lab Results  Component Value Date   HGBA1C 9.2 03/17/2021   HGBA1C 9.8 (H) 07/29/2020   HGBA1C 9.5 (H) 04/15/2020   Lab Results   Component Value Date   LDLCALC 91 04/04/2021   CREATININE 1.25 (H) 04/04/2021   - Start tirzepatide (MOUNJARO) 2.5 MG/0.5ML Pen; Inject 2.5 mg into the skin once a week.  Dispense: 2 mL; Refill: 0  2. Polyphagia Not at goal. Current treatment: phentermine 18.75 mg daily. Polyphagia refers to excessive feelings of hunger. She will continue to focus on protein-rich, low simple carbohydrate foods. We reviewed the importance of hydration, regular exercise for stress reduction, and restorative sleep.  Plan:  Increase phentermine to 37.5 mg daily, as per below.  Having again reminded the patient of the "off label" use of Phentermine beyond three consecutive months, and again discussing the risks, benefits, contraindications, and limitations of it's use; given it's role in the successful treatment of obesity thus far and lack of adverse effect, patient has expressed desire and given informed verbal consent to continue use.   I have consulted the [place] Controlled Substances Registry for this patient, and feel the risk/benefit ratio today is favorable for proceeding with this prescription for a controlled substance. The patient understands monitoring parameters and red flags.   - Increase phentermine (ADIPEX-P) 37.5 MG tablet; Take 1 tablet (37.5 mg total) by mouth daily before breakfast.  Dispense: 30 tablet; Refill: 0  3. Vitamin D deficiency Not at goal.  She is taking vitamin D 50,000 IU weekly.  Plan: Continue to take prescription Vitamin D '@50'$ ,000 IU every week as prescribed.  Follow-up for routine testing  of Vitamin D, at least 2-3 times per year to avoid over-replacement.  Lab Results  Component Value Date   VD25OH 39.4 04/04/2021   VD25OH 32.0 06/30/2020   VD25OH 40.5 03/01/2020   4. Obesity, current BMI 33.5  Course: [name] is currently in the action stage of change. As such, her goal is to continue with weight loss efforts.   Nutrition goals: She has agreed to keeping a food journal  and adhering to recommended goals of 1500 calories and 95 grams of protein.   Exercise goals:  As is.  Behavioral modification strategies: increasing lean protein intake, decreasing simple carbohydrates, increasing vegetables, and increasing water intake.  [name] has agreed to follow-up with our clinic in 4 weeks. She was informed of the importance of frequent follow-up visits to maximize her success with intensive lifestyle modifications for her multiple health conditions.   Objective:   Blood pressure 128/83, pulse 83, temperature 98.4 F (36.9 C), temperature source Oral, height '5\' 6"'$  (1.676 m), weight 207 lb (93.9 kg), SpO2 98 %. Body mass index is 33.41 kg/m.  General: Cooperative, alert, well developed, in no acute distress. HEENT: Conjunctivae and lids unremarkable. Cardiovascular: Regular rhythm.  Lungs: Normal work of breathing. Neurologic: No focal deficits.   Lab Results  Component Value Date   CREATININE 1.25 (H) 04/04/2021   BUN 18 04/04/2021   NA 137 04/04/2021   K 4.2 04/04/2021   CL 99 04/04/2021   CO2 21 04/04/2021   Lab Results  Component Value Date   ALT 16 04/04/2021   AST 18 04/04/2021   ALKPHOS 155 (H) 04/04/2021   BILITOT 0.4 04/04/2021   Lab Results  Component Value Date   HGBA1C 9.2 03/17/2021   HGBA1C 9.8 (H) 07/29/2020   HGBA1C 9.5 (H) 04/15/2020   HGBA1C 8.0 (H) 09/09/2019   HGBA1C 9.0 04/05/2017   Lab Results  Component Value Date   TSH 1.650 04/04/2021   Lab Results  Component Value Date   CHOL 200 (H) 04/04/2021   HDL 102 04/04/2021   LDLCALC 91 04/04/2021   TRIG 36 04/04/2021   CHOLHDL 2.0 04/04/2021   Lab Results  Component Value Date   VD25OH 39.4 04/04/2021   VD25OH 32.0 06/30/2020   VD25OH 40.5 03/01/2020   Lab Results  Component Value Date   WBC 7.6 04/04/2021   HGB 15.2 04/04/2021   HCT 45.8 04/04/2021   MCV 87 04/04/2021   PLT 287 04/04/2021   Lab Results  Component Value Date   IRON 41 11/13/2019   TIBC  267 11/13/2019   FERRITIN 77 11/13/2019   Obesity Behavioral Intervention:   Approximately 15 minutes were spent on the discussion below.  ASK: We discussed the diagnosis of obesity with [name] today and [name] agreed to give [name] permission to discuss obesity behavioral modification therapy today.  ASSESS: [name] has the diagnosis of obesity and her BMI today is 33.5. [name] is in the action stage of change.   ADVISE: [name] was educated on the multiple health risks of obesity as well as the benefit of weight loss to improve her health. She was advised of the need for long term treatment and the importance of lifestyle modifications to improve her current health and to decrease her risk of future health problems.  AGREE: Multiple dietary modification options and treatment options were discussed and [name] agreed to follow the recommendations documented in the above note.  ARRANGE: [name] was educated on the importance of frequent visits to treat obesity as  outlined per CMS and USPSTF guidelines and agreed to schedule her next follow up appointment today.  Attestation Statements:   Reviewed by clinician on day of visit: allergies, medications, problem list, medical history, surgical history, family history, social history, and previous encounter notes.  I, Water quality scientist, CMA, am acting as transcriptionist for Briscoe Deutscher, DO  I have reviewed the above documentation for accuracy and completeness, and I agree with the above. Briscoe Deutscher, DO

## 2021-05-10 ENCOUNTER — Encounter (INDEPENDENT_AMBULATORY_CARE_PROVIDER_SITE_OTHER): Payer: Self-pay

## 2021-05-10 ENCOUNTER — Other Ambulatory Visit (INDEPENDENT_AMBULATORY_CARE_PROVIDER_SITE_OTHER): Payer: Self-pay | Admitting: Family Medicine

## 2021-05-10 DIAGNOSIS — E1069 Type 1 diabetes mellitus with other specified complication: Secondary | ICD-10-CM

## 2021-05-10 NOTE — Telephone Encounter (Signed)
Dr.Wallace °

## 2021-05-17 ENCOUNTER — Ambulatory Visit: Payer: Medicare Other | Admitting: Nutrition

## 2021-05-21 DIAGNOSIS — E1065 Type 1 diabetes mellitus with hyperglycemia: Secondary | ICD-10-CM | POA: Diagnosis not present

## 2021-05-21 DIAGNOSIS — E1040 Type 1 diabetes mellitus with diabetic neuropathy, unspecified: Secondary | ICD-10-CM | POA: Diagnosis not present

## 2021-05-30 DIAGNOSIS — E103513 Type 1 diabetes mellitus with proliferative diabetic retinopathy with macular edema, bilateral: Secondary | ICD-10-CM | POA: Diagnosis not present

## 2021-05-30 DIAGNOSIS — E103523 Type 1 diabetes mellitus with proliferative diabetic retinopathy with traction retinal detachment involving the macula, bilateral: Secondary | ICD-10-CM | POA: Diagnosis not present

## 2021-05-30 DIAGNOSIS — H35373 Puckering of macula, bilateral: Secondary | ICD-10-CM | POA: Diagnosis not present

## 2021-05-31 ENCOUNTER — Other Ambulatory Visit: Payer: Self-pay

## 2021-05-31 ENCOUNTER — Encounter: Payer: Medicare Other | Admitting: Physical Medicine and Rehabilitation

## 2021-06-01 ENCOUNTER — Telehealth (INDEPENDENT_AMBULATORY_CARE_PROVIDER_SITE_OTHER): Payer: Medicare Other | Admitting: Family Medicine

## 2021-06-01 ENCOUNTER — Encounter (INDEPENDENT_AMBULATORY_CARE_PROVIDER_SITE_OTHER): Payer: Self-pay | Admitting: Family Medicine

## 2021-06-01 DIAGNOSIS — Z6832 Body mass index (BMI) 32.0-32.9, adult: Secondary | ICD-10-CM | POA: Diagnosis not present

## 2021-06-01 DIAGNOSIS — K3184 Gastroparesis: Secondary | ICD-10-CM

## 2021-06-01 DIAGNOSIS — E1069 Type 1 diabetes mellitus with other specified complication: Secondary | ICD-10-CM

## 2021-06-01 DIAGNOSIS — F439 Reaction to severe stress, unspecified: Secondary | ICD-10-CM

## 2021-06-01 DIAGNOSIS — E669 Obesity, unspecified: Secondary | ICD-10-CM | POA: Diagnosis not present

## 2021-06-02 DIAGNOSIS — E1021 Type 1 diabetes mellitus with diabetic nephropathy: Secondary | ICD-10-CM | POA: Diagnosis not present

## 2021-06-02 DIAGNOSIS — N1832 Chronic kidney disease, stage 3b: Secondary | ICD-10-CM | POA: Diagnosis not present

## 2021-06-02 DIAGNOSIS — I10 Essential (primary) hypertension: Secondary | ICD-10-CM | POA: Diagnosis not present

## 2021-06-02 NOTE — Progress Notes (Signed)
TeleHealth Visit:  Due to the COVID-19 pandemic, this visit was completed with telemedicine (audio/video) technology to reduce patient and provider exposure as well as to preserve personal protective equipment.   [name] has verbally consented to this TeleHealth visit. The patient is located at home, the provider is located at the [facility] office. The participants in this visit include the listed provider and patient. The visit was conducted today via MyChart video.  Chief Complaint: OBESITY [name] is here to discuss her progress with her obesity treatment plan along with follow-up of her obesity related diagnoses. [name] is on keeping a food journal and adhering to recommended goals of 1500 calories and 95 grams of protein and states she is following her eating plan approximately 50% of the time. [name] states she is Walking for 30 minutes 2 times per week.  Today's visit was #: 20 Starting weight: 199 lbs Starting date: 11/13/2019  Interim History: Not getting enough water.  She is looking into moving to decrease stress.  She has diabetes education next week.  Assessment/Plan:   1. Type 1 diabetes mellitus with other specified complication ([name]) Mounjaro was not on her Medicare formulary at last prescription.  She will see if now covered.  She has diabetes education next week.  Blood glucose is stable.  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  She will MyChart message next week to update me regarding above.  If [name] is still not available, consider [name] (low dose and to be stopped immediately if she develops gastroparesis).  2. Gastroparesis, history Will monitor very closely.   3. Situational stress Working on moving away from her in-laws and into her own house.   4. Obesity, current BMI 33.5  [name] is currently in the action stage of change. As such, her goal is to continue with weight loss efforts. She has agreed to keeping a  food journal and adhering to recommended goals of 1500 calories and 95 grams of protein.   Exercise goals:  Increase activity.  Behavioral modification strategies: increasing lean protein intake, decreasing simple carbohydrates, increasing vegetables, and increasing water intake.  [name] has agreed to follow-up with our clinic in 4 weeks. She was informed of the importance of frequent follow-up visits to maximize her success with intensive lifestyle modifications for her multiple health conditions.  Objective:   VITALS: Per patient if applicable, see vitals. GENERAL: Alert and in no acute distress. CARDIOPULMONARY: No increased WOB. Speaking in clear sentences.  PSYCH: Pleasant and cooperative. Speech normal rate and rhythm. Affect is appropriate. Insight and judgement are appropriate. Attention is focused, linear, and appropriate.  NEURO: Oriented as arrived to appointment on time with no prompting.   Lab Results  Component Value Date   CREATININE 1.25 (H) 04/04/2021   BUN 18 04/04/2021   NA 137 04/04/2021   K 4.2 04/04/2021   CL 99 04/04/2021   CO2 21 04/04/2021   Lab Results  Component Value Date   ALT 16 04/04/2021   AST 18 04/04/2021   ALKPHOS 155 (H) 04/04/2021   BILITOT 0.4 04/04/2021   Lab Results  Component Value Date   HGBA1C 9.2 03/17/2021   HGBA1C 9.8 (H) 07/29/2020   HGBA1C 9.5 (H) 04/15/2020   HGBA1C 8.0 (H) 09/09/2019   HGBA1C 9.0 04/05/2017   Lab Results  Component Value Date   TSH 1.650 04/04/2021   Lab Results  Component Value Date   CHOL 200 (H) 04/04/2021  HDL 102 04/04/2021   LDLCALC 91 04/04/2021   TRIG 36 04/04/2021   CHOLHDL 2.0 04/04/2021   Lab Results  Component Value Date   VD25OH 39.4 04/04/2021   VD25OH 32.0 06/30/2020   VD25OH 40.5 03/01/2020   Lab Results  Component Value Date   WBC 7.6 04/04/2021   HGB 15.2 04/04/2021   HCT 45.8 04/04/2021   MCV 87 04/04/2021   PLT 287 04/04/2021   Lab Results  Component Value Date    IRON 41 11/13/2019   TIBC 267 11/13/2019   FERRITIN 77 11/13/2019   Attestation Statements:   Reviewed by clinician on day of visit: allergies, medications, problem list, medical history, surgical history, family history, social history, and previous encounter notes.  I, Water quality scientist, CMA, am acting as transcriptionist for Briscoe Deutscher, DO  I have reviewed the above documentation for accuracy and completeness, and I agree with the above. Briscoe Deutscher, DO

## 2021-06-06 ENCOUNTER — Ambulatory Visit: Payer: Medicare Other | Admitting: Nutrition

## 2021-06-07 ENCOUNTER — Telehealth: Payer: Self-pay | Admitting: Physical Medicine and Rehabilitation

## 2021-06-07 ENCOUNTER — Encounter: Payer: Medicare Other | Admitting: Physical Medicine and Rehabilitation

## 2021-06-07 NOTE — Telephone Encounter (Signed)
Rescheduled

## 2021-06-07 NOTE — Telephone Encounter (Signed)
Pt called stating she forgot she had an appt today with school starting, and would like a CB to R/S her appt  [phone]

## 2021-06-08 ENCOUNTER — Other Ambulatory Visit: Payer: Self-pay

## 2021-06-08 ENCOUNTER — Ambulatory Visit (INDEPENDENT_AMBULATORY_CARE_PROVIDER_SITE_OTHER): Payer: Medicare Other | Admitting: Licensed Clinical Social Worker

## 2021-06-08 DIAGNOSIS — F3162 Bipolar disorder, current episode mixed, moderate: Secondary | ICD-10-CM | POA: Diagnosis not present

## 2021-06-08 NOTE — Progress Notes (Signed)
Virtual Visit via Video Note  I connected with [name] on 06/08/21 at 10:00 AM EDT by a video enabled telemedicine application and verified that I am speaking with the correct person using two identifiers.  Location: Patient: home Provider: [facility]   I discussed the limitations of evaluation and management by telemedicine and the availability of in person appointments. The patient expressed understanding and agreed to proceed.   I discussed the assessment and treatment plan with the patient. The patient was provided an opportunity to ask questions and all were answered. The patient agreed with the plan and demonstrated an understanding of the instructions.   The patient was advised to call back or seek an in-person evaluation if the symptoms worsen or if the condition fails to improve as anticipated.  I provided 45 minutes of non-face-to-face time during this encounter.   Kambria Grima R Ryker Pherigo, LCSW   THERAPIST PROGRESS NOTE  Session Time: 10-10:45a  Participation Level: Active  Behavioral Response: Neat and Well GroomedAlertAnxious and Depressed  Type of Therapy: Individual Therapy  Treatment Goals addressed: Anxiety and Diagnosis: depression  Interventions: CBT and DBT  Summary: Chalene Cervenka is a 35 y.o. female who presents with reporting improving symptoms related to bipolar disorder diagnosis. Patient reports overall mood is stable and that she is managing situational anxiety and stress well. Patient reports good quality and quantity of sleep. Patient reports that she is getting a break during the day as her children are back in school and preschool. Patient reports continuing primary stressor is having a place of her own to live with all the children together, instead of being separated half of the family living with her inlaws and the other half living in the camper. Patient feels that this contributes to a sense of disconnection within the family. Patient has discussed  this with her husband, and they are currently brainstorming solutions. Discussed recent family get-together celebrating patients grandmother's 93rd birthday.Continued recommendations are as follows: self care behaviors, positive social engagements, focusing on overall work/home/life balance, and focusing on positive physical and emotional wellness. .   Suicidal/Homicidal: No  Therapist Response: Kenyetta is trying hard to utilize coping skills recommended to manage mood and anxiety fluctuations. Keimya is able to identify life conflict from past and present and understand how they form present levels of anxiety. Ronnette is able to verbalize an understanding of the role that fearful thinking plays in creating fears, excessive worry, and persistent anxiety symptoms. Tarji is trying hard to engage in physical and recreational activities that reflect increased energy and interest. These behaviors are reflective of both personal growth and progress. Treatment to continue as indicated  Plan: Return again in 4 weeks.  Diagnosis: Axis I: Bipolar, mixed    Axis II: No diagnosis    [place], LCSW 06/08/2021

## 2021-06-10 ENCOUNTER — Telehealth: Payer: Self-pay | Admitting: Gastroenterology

## 2021-06-10 NOTE — Telephone Encounter (Signed)
Patient called having issues with constipation and she would like to know if she can get medication to help her.

## 2021-06-10 NOTE — Telephone Encounter (Signed)
Left detailed message letting patient know that she has not been seen since 2020 and will need an appt for updated evaluation.

## 2021-06-21 ENCOUNTER — Other Ambulatory Visit: Payer: Self-pay

## 2021-06-21 ENCOUNTER — Encounter: Payer: Medicare Other | Attending: Internal Medicine | Admitting: Nutrition

## 2021-06-21 DIAGNOSIS — E1065 Type 1 diabetes mellitus with hyperglycemia: Secondary | ICD-10-CM | POA: Diagnosis not present

## 2021-06-21 DIAGNOSIS — E1021 Type 1 diabetes mellitus with diabetic nephropathy: Secondary | ICD-10-CM | POA: Insufficient documentation

## 2021-06-21 DIAGNOSIS — E1040 Type 1 diabetes mellitus with diabetic neuropathy, unspecified: Secondary | ICD-10-CM | POA: Diagnosis not present

## 2021-06-21 DIAGNOSIS — N1832 Chronic kidney disease, stage 3b: Secondary | ICD-10-CM | POA: Diagnosis present

## 2021-06-21 DIAGNOSIS — E1022 Type 1 diabetes mellitus with diabetic chronic kidney disease: Secondary | ICD-10-CM

## 2021-06-21 NOTE — Progress Notes (Signed)
Patient is here today to have an overview of her care and use of her pump/helping with swings in blood sugar and help with general care. Discussed:  how to treat lows: confirming first with a fingerstick, using 15 grams of glucose for a 60 point rise,wait 15 minutes and test blood, not looking at sensor readings-which can be 30 min., before rising.  Also discussed to take an additional 7 grams of glucose for every unit of IOB.  Discussed also the need to add 20 points to blood sugar readings when arrow is pointing at an angle upward, or reducing the sensor reading when arrow is angled downward.  Discussed also best times to calibrate sensors, depending on blood sugar arrow directions, and need for taping sensor with 2 pieces of tape, which prevents movement causing less erroneous blood sugar readings. Patient is experiencing much family stress-with death of brother from diabetes and sudden death of father. She gets little help from husband, and cooks for large family which does not like healthy meal preparations. She is currently trying to maintain a healthy diet of low carbs with a current weight loss of 6 pounds.  She is not exercising:  no time.  Discussed need for this, to maintain weight loss. She will plan to walk or go to the gym after taking her child to preschool at [place].  Discussed need to reduce premeal breakfast bolus, or to reduce basal rate by 20% if eating a protein shake with 0 carbs, and she agreed to do this. Discussed possible reductions of family stress with other options like meditation, and how to shake off bad thoughts and actions that are caused by other family members.   Diet is high in protein, and stressed need to limit protein to no more than 14 grams at breakfast, lunch and 21 at supper due to kidney damage.   Discussed that fat is higher in calories than protein and carbs and ways to reduce fat in her diet. Carb counting appears accurate, I/C ratio appears to be working for her  at this time.   Stressed need to change these if weight loss continues due to decreased insulin resistance as blood sugars improve Attitude for change is strong now and motivation is also strong.  Believe patient will do well with current knowledge of diet and use of pump to help with current changes in diet and exercise.

## 2021-06-22 NOTE — Patient Instructions (Signed)
Add 20 points to blood sugar readings when arrow is angled upward, and reduce by 20 points when angled downward Use fruit juice or glucose tablets to treat low blood sugars, 15 grams for 60 point blood sugar rise, and 7 more for every unit of insulin on board Reduce breakfast bolus by 20% if exercising after breakfast, or reduce basal rate by 20%, if no bolus is use for breakfast Remember, fat grams have more calories than carb and protein, so limit them whenever possible by using lower fat salad dressings, cream cheese, yogurts and and limit oil and fried foods. Walk for 20-30 min. 3-4 days/wk, and get some strength training exercise in 1-2 days/wk. Call if questions.

## 2021-06-23 ENCOUNTER — Other Ambulatory Visit: Payer: Self-pay | Admitting: Psychiatry

## 2021-06-23 DIAGNOSIS — F430 Acute stress reaction: Secondary | ICD-10-CM

## 2021-06-23 DIAGNOSIS — F4001 Agoraphobia with panic disorder: Secondary | ICD-10-CM

## 2021-06-28 ENCOUNTER — Other Ambulatory Visit: Payer: Self-pay

## 2021-06-28 ENCOUNTER — Encounter: Payer: Self-pay | Admitting: Physical Medicine and Rehabilitation

## 2021-06-28 ENCOUNTER — Ambulatory Visit (INDEPENDENT_AMBULATORY_CARE_PROVIDER_SITE_OTHER): Payer: Medicare Other | Admitting: Physical Medicine and Rehabilitation

## 2021-06-28 DIAGNOSIS — R202 Paresthesia of skin: Secondary | ICD-10-CM | POA: Diagnosis not present

## 2021-06-28 NOTE — Progress Notes (Signed)
[name] - 35 y.o. female MRN [id number]  Date of birth: 19-Apr-1986  Office Visit Note: Visit Date: 06/28/2021 PCP: Dede Query, PA-C Referred by: Dede Query, PA-C  Subjective: Chief Complaint  Patient presents with   Right Hand - Numbness   HPI:  [name] is a 35 y.o. female who comes in today at the request of Dr. Anderson Malta for electrodiagnostic study of the Right upper extremities.  Patient is Right hand dominant.  She reports chronic worsening severe numbness in the right hand mostly radial digits.  She reports worsening with holding the arm up and with sleeping.  She has a positive flick sign.  She feels her symptoms are somewhat global in all the fingers at times.  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  She went on to have carpal tunnel release by Dr. Arther Abbott with good results.  She denies any frank radicular symptoms.  She is diabetic.  She does have a history of complications from her diabetes including neuropathy.   ROS Otherwise per HPI.  Assessment & Plan: Visit Diagnoses:    ICD-10-CM   1. Paresthesia of skin  R20.2 NCV with EMG (electromyography)      Plan: Impression: The above electrodiagnostic study is ABNORMAL and reveals evidence of a moderate to severe right median nerve entrapment at the wrist (carpal tunnel syndrome) affecting sensory and motor components.   There is no significant electrodiagnostic evidence of any other focal nerve entrapment, brachial plexopathy or cervical radiculopathy in the right upper limb.   Recommendations: 1.  Follow-up with referring physician. 2.  Continue current management of symptoms. 3.  Continue use of resting splint at night-time and as needed during the day. 4.  Suggest surgical evaluation.  Meds & Orders: No orders of the defined types were placed in this encounter.   Orders Placed This  Encounter  Procedures   NCV with EMG (electromyography)    Follow-up: Return in 2 weeks (on 07/12/2021) for G. Alphonzo Severance, MD.   Procedures: No procedures performed  EMG & NCV Findings: Evaluation of the right median motor nerve showed prolonged distal onset latency (6.4 ms) and decreased conduction velocity (Elbow-Wrist, 43 m/s).  The right median (across palm) sensory nerve showed prolonged distal peak latency (Wrist, 6.0 ms), reduced amplitude (9.8 V), and prolonged distal peak latency (Palm, 2.3 ms).  All remaining nerves (as indicated in the following tables) were within normal limits.    All examined muscles (as indicated in the following table) showed no evidence of electrical instability.    Impression: The above electrodiagnostic study is ABNORMAL and reveals evidence of a moderate to severe right median nerve entrapment at the wrist (carpal tunnel syndrome) affecting sensory and motor components.   There is no significant electrodiagnostic evidence of any other focal nerve entrapment, brachial plexopathy or cervical radiculopathy in the right upper limb.   Recommendations: 1.  Follow-up with referring physician. 2.  Continue current management of symptoms. 3.  Continue use of resting splint at night-time and as needed during the day. 4.  Suggest surgical evaluation.  ___________________________ Laurence Spates FAAPMR Board Certified, American Board of Physical Medicine and Rehabilitation    Nerve Conduction Studies Anti Sensory Summary Table   Stim Site NR Peak (ms) Norm Peak (ms) P-T Amp (V) Norm P-T Amp Site1 Site2 Delta-P (ms) Dist (cm) Vel (m/s) Norm Vel (m/s)  Right  Median Acr Palm Anti Sensory (2nd Digit)  31.1C  Wrist    *6.0 <3.6 *9.8 >10 Wrist Palm 3.7 0.0    Palm    *2.3 <2.0 8.2         Right Radial Anti Sensory (Base 1st Digit)  31.2C  Wrist    2.6 <3.1 16.6  Wrist Base 1st Digit 2.6 0.0    Right Ulnar Anti Sensory (5th Digit)  31.4C  Wrist    3.6 <3.7  16.9 >15.0 Wrist 5th Digit 3.6 14.0 39 >38   Motor Summary Table   Stim Site NR Onset (ms) Norm Onset (ms) O-P Amp (mV) Norm O-P Amp Site1 Site2 Delta-0 (ms) Dist (cm) Vel (m/s) Norm Vel (m/s)  Right Median Motor (Abd Poll Brev)  31.4C  Wrist    *6.4 <4.2 8.6 >5 Elbow Wrist 4.9 21.0 *43 >50  Elbow    11.3  7.8         Right Ulnar Motor (Abd Dig Min)  31.4C  Wrist    3.1 <4.2 7.4 >3 B Elbow Wrist 3.6 20.0 56 >53  B Elbow    6.7  8.6  A Elbow B Elbow 1.4 10.0 71 >53  A Elbow    8.1  7.8          EMG   Side Muscle Nerve Root Ins Act Fibs Psw Amp Dur Poly Recrt Int Fraser Din Comment  Right Abd Poll Brev Median C8-T1 Nml Nml Nml Nml Nml 0 Nml Nml   Right 1stDorInt Ulnar C8-T1 Nml Nml Nml Nml Nml 0 Nml Nml   Right PronatorTeres Median C6-7 Nml Nml Nml Nml Nml 0 Nml Nml   Right Biceps Musculocut C5-6 Nml Nml Nml Nml Nml 0 Nml Nml   Right Deltoid Axillary C5-6 Nml Nml Nml Nml Nml 0 Nml Nml     Nerve Conduction Studies Anti Sensory Left/Right Comparison   Stim Site L Lat (ms) R Lat (ms) L-R Lat (ms) L Amp (V) R Amp (V) L-R Amp (%) Site1 Site2 L Vel (m/s) R Vel (m/s) L-R Vel (m/s)  Median Acr Palm Anti Sensory (2nd Digit)  31.1C  Wrist  *6.0   *9.8  Wrist Palm     Palm  *2.3   8.2        Radial Anti Sensory (Base 1st Digit)  31.2C  Wrist  2.6   16.6  Wrist Base 1st Digit     Ulnar Anti Sensory (5th Digit)  31.4C  Wrist  3.6   16.9  Wrist 5th Digit  39    Motor Left/Right Comparison   Stim Site L Lat (ms) R Lat (ms) L-R Lat (ms) L Amp (mV) R Amp (mV) L-R Amp (%) Site1 Site2 L Vel (m/s) R Vel (m/s) L-R Vel (m/s)  Median Motor (Abd Poll Brev)  31.4C  Wrist  *6.4   8.6  Elbow Wrist  *43   Elbow  11.3   7.8        Ulnar Motor (Abd Dig Min)  31.4C  Wrist  3.1   7.4  B Elbow Wrist  56   B Elbow  6.7   8.6  A Elbow B Elbow  71   A Elbow  8.1   7.8           Waveforms:            Clinical History: 07/11/2019 EMG/NCS  Impression: The above electrodiagnostic study is ABNORMAL  and reveals evidence of a moderate to severe  left median nerve entrapment at the wrist (carpal tunnel syndrome) affecting sensory and motor components.    There is no significant electrodiagnostic evidence of any other focal nerve entrapment, brachial plexopathy or cervical radiculopathy.    Recommendations: 1.  Follow-up with referring physician. 2.  Continue current management of symptoms. 3.  Continue use of resting splint at night-time and as needed during the day. 4.  Suggest surgical evaluation.   ___________________________ Wonda Olds Board Certified, American Board of Physical Medicine and Rehabilitation     Objective:  VS:  HT:    WT:   BMI:     BP:   HR: bpm  TEMP: ( )  RESP:  Physical Exam Musculoskeletal:        General: No swelling, tenderness or deformity.     Comments: Inspection reveals carpal tunnel release scar on the left but no atrophy of the bilateral APB or FDI or hand intrinsics. There is no swelling, color changes, allodynia or dystrophic changes. There is 5 out of 5 strength in the bilateral wrist extension, finger abduction and long finger flexion. There is intact sensation to light touch in all dermatomal and peripheral nerve distributions. There is a positive Phalen's test on the right. There is a negative Hoffmann's test bilaterally.  Skin:    General: Skin is warm and dry.     Findings: No erythema or rash.  Neurological:     General: No focal deficit present.     Mental Status: She is alert and oriented to person, place, and time.     Motor: No weakness or abnormal muscle tone.     Coordination: Coordination normal.  Psychiatric:        Mood and Affect: Mood normal.        Behavior: Behavior normal.     Imaging: No results found.

## 2021-06-28 NOTE — Progress Notes (Signed)
Numbness in right hand. Worse with holding arm up and with sleeping. Reports numbness in all fingers.  Right hand dominant No lotion per patient

## 2021-06-29 NOTE — Procedures (Signed)
EMG & NCV Findings: Evaluation of the right median motor nerve showed prolonged distal onset latency (6.4 ms) and decreased conduction velocity (Elbow-Wrist, 43 m/s).  The right median (across palm) sensory nerve showed prolonged distal peak latency (Wrist, 6.0 ms), reduced amplitude (9.8 V), and prolonged distal peak latency (Palm, 2.3 ms).  All remaining nerves (as indicated in the following tables) were within normal limits.    All examined muscles (as indicated in the following table) showed no evidence of electrical instability.    Impression: The above electrodiagnostic study is ABNORMAL and reveals evidence of a moderate to severe right median nerve entrapment at the wrist (carpal tunnel syndrome) affecting sensory and motor components.   There is no significant electrodiagnostic evidence of any other focal nerve entrapment, brachial plexopathy or cervical radiculopathy in the right upper limb.   Recommendations: 1.  Follow-up with referring physician. 2.  Continue current management of symptoms. 3.  Continue use of resting splint at night-time and as needed during the day. 4.  Suggest surgical evaluation.  ___________________________ Laurence Spates FAAPMR Board Certified, American Board of Physical Medicine and Rehabilitation    Nerve Conduction Studies Anti Sensory Summary Table   Stim Site NR Peak (ms) Norm Peak (ms) P-T Amp (V) Norm P-T Amp Site1 Site2 Delta-P (ms) Dist (cm) Vel (m/s) Norm Vel (m/s)  Right Median Acr Palm Anti Sensory (2nd Digit)  31.1C  Wrist    *6.0 <3.6 *9.8 >10 Wrist Palm 3.7 0.0    Palm    *2.3 <2.0 8.2         Right Radial Anti Sensory (Base 1st Digit)  31.2C  Wrist    2.6 <3.1 16.6  Wrist Base 1st Digit 2.6 0.0    Right Ulnar Anti Sensory (5th Digit)  31.4C  Wrist    3.6 <3.7 16.9 >15.0 Wrist 5th Digit 3.6 14.0 39 >38   Motor Summary Table   Stim Site NR Onset (ms) Norm Onset (ms) O-P Amp (mV) Norm O-P Amp Site1 Site2 Delta-0 (ms) Dist (cm) Vel  (m/s) Norm Vel (m/s)  Right Median Motor (Abd Poll Brev)  31.4C  Wrist    *6.4 <4.2 8.6 >5 Elbow Wrist 4.9 21.0 *43 >50  Elbow    11.3  7.8         Right Ulnar Motor (Abd Dig Min)  31.4C  Wrist    3.1 <4.2 7.4 >3 B Elbow Wrist 3.6 20.0 56 >53  B Elbow    6.7  8.6  A Elbow B Elbow 1.4 10.0 71 >53  A Elbow    8.1  7.8          EMG   Side Muscle Nerve Root Ins Act Fibs Psw Amp Dur Poly Recrt Int Fraser Din Comment  Right Abd Poll Brev Median C8-T1 Nml Nml Nml Nml Nml 0 Nml Nml   Right 1stDorInt Ulnar C8-T1 Nml Nml Nml Nml Nml 0 Nml Nml   Right PronatorTeres Median C6-7 Nml Nml Nml Nml Nml 0 Nml Nml   Right Biceps Musculocut C5-6 Nml Nml Nml Nml Nml 0 Nml Nml   Right Deltoid Axillary C5-6 Nml Nml Nml Nml Nml 0 Nml Nml     Nerve Conduction Studies Anti Sensory Left/Right Comparison   Stim Site L Lat (ms) R Lat (ms) L-R Lat (ms) L Amp (V) R Amp (V) L-R Amp (%) Site1 Site2 L Vel (m/s) R Vel (m/s) L-R Vel (m/s)  Median Acr Palm Anti Sensory (2nd Digit)  31.1C  Wrist  *  6.0   *9.8  Wrist Palm     Palm  *2.3   8.2        Radial Anti Sensory (Base 1st Digit)  31.2C  Wrist  2.6   16.6  Wrist Base 1st Digit     Ulnar Anti Sensory (5th Digit)  31.4C  Wrist  3.6   16.9  Wrist 5th Digit  39    Motor Left/Right Comparison   Stim Site L Lat (ms) R Lat (ms) L-R Lat (ms) L Amp (mV) R Amp (mV) L-R Amp (%) Site1 Site2 L Vel (m/s) R Vel (m/s) L-R Vel (m/s)  Median Motor (Abd Poll Brev)  31.4C  Wrist  *6.4   8.6  Elbow Wrist  *43   Elbow  11.3   7.8        Ulnar Motor (Abd Dig Min)  31.4C  Wrist  3.1   7.4  B Elbow Wrist  56   B Elbow  6.7   8.6  A Elbow B Elbow  71   A Elbow  8.1   7.8           Waveforms:

## 2021-07-01 DIAGNOSIS — E1065 Type 1 diabetes mellitus with hyperglycemia: Secondary | ICD-10-CM | POA: Diagnosis not present

## 2021-07-01 DIAGNOSIS — E1040 Type 1 diabetes mellitus with diabetic neuropathy, unspecified: Secondary | ICD-10-CM | POA: Diagnosis not present

## 2021-07-04 DIAGNOSIS — E1022 Type 1 diabetes mellitus with diabetic chronic kidney disease: Secondary | ICD-10-CM | POA: Diagnosis not present

## 2021-07-04 DIAGNOSIS — N1832 Chronic kidney disease, stage 3b: Secondary | ICD-10-CM | POA: Diagnosis not present

## 2021-07-04 DIAGNOSIS — E1065 Type 1 diabetes mellitus with hyperglycemia: Secondary | ICD-10-CM | POA: Diagnosis not present

## 2021-07-04 DIAGNOSIS — E1040 Type 1 diabetes mellitus with diabetic neuropathy, unspecified: Secondary | ICD-10-CM | POA: Diagnosis not present

## 2021-07-04 DIAGNOSIS — I10 Essential (primary) hypertension: Secondary | ICD-10-CM | POA: Diagnosis not present

## 2021-07-04 DIAGNOSIS — Z794 Long term (current) use of insulin: Secondary | ICD-10-CM | POA: Diagnosis not present

## 2021-07-06 ENCOUNTER — Ambulatory Visit (INDEPENDENT_AMBULATORY_CARE_PROVIDER_SITE_OTHER): Payer: Medicare Other | Admitting: Licensed Clinical Social Worker

## 2021-07-06 ENCOUNTER — Other Ambulatory Visit: Payer: Self-pay

## 2021-07-06 DIAGNOSIS — F411 Generalized anxiety disorder: Secondary | ICD-10-CM | POA: Diagnosis not present

## 2021-07-06 DIAGNOSIS — F3162 Bipolar disorder, current episode mixed, moderate: Secondary | ICD-10-CM

## 2021-07-06 NOTE — Plan of Care (Signed)
  Problem: Alleviate depressive/manic symptoms and return to improved levels of effective functioning. Goal: LTG: Stabilize mood and increase goal-directed behavior: Input needed on appropriate metric Outcome: Progressing Goal: STG: @PREFFIRSTNAME @ will attend at least 80% of scheduled follow-up counseling appointments Outcome: Progressing Intervention: Give positive reinforcement and praise Intervention: Assist with relaxation techniques, as appropriate (deep breathing exercises, meditation, guided imagery) Intervention: Assess emotional status and coping mechanisms

## 2021-07-06 NOTE — Progress Notes (Signed)
Virtual Visit via Video Note  I connected with [name] on 07/06/21 at  9:00 AM EDT by a video enabled telemedicine application and verified that I am speaking with the correct person using two identifiers.  Location: Patient: home Provider: remote office [place], [place])   I discussed the limitations of evaluation and management by telemedicine and the availability of in person appointments. The patient expressed understanding and agreed to proceed.   I discussed the assessment and treatment plan with the patient. The patient was provided an opportunity to ask questions and all were answered. The patient agreed with the plan and demonstrated an understanding of the instructions.   The patient was advised to call back or seek an in-person evaluation if the symptoms worsen or if the condition fails to improve as anticipated.  I provided 60 minutes of non-face-to-face time during this encounter.   Aava Deland R Genesi Stefanko, LCSW   THERAPIST PROGRESS NOTE  Session Time: 9-9:45a  Participation Level: Active  Behavioral Response: Neat and Well GroomedAlertAnxious and Irritable  Type of Therapy: Individual Therapy  Treatment Goals addressed:  Problem: Alleviate depressive/manic symptoms and return to improved levels of effective functioning.  Goal: LTG: Stabilize mood and increase goal-directed behavior: per pt report Outcome: Progressing  Goal: STG: @PREFFIRSTNAME @ will attend at least 80% of scheduled follow-up counseling appointments Outcome: Progressing  Interventions:   Intervention: Give positive reinforcement and praise  Intervention: Assist with relaxation techniques, as appropriate (deep breathing exercises, meditation, guided imagery)  Intervention: Assess emotional status and coping mechanisms   Summary: [name] is a 35 y.o. female who presents with improving symptoms related to bipolar disorder dx.  Pt reports overall mood is stable and that she is  managing situational stressors well.  Allowed pt safe space to explore and express thoughts and feelings about life events and stressors:  explored relationship with husband, current housing situation, kids school schedule now that they are back in school, relationship with in-laws.  Reviewed coping skills and encouraged pt to continue to set limits and boundaries as needed.   Continued recommendations are as follows: self care behaviors, positive social engagements, focusing on overall work/home/life balance, and focusing on positive physical and emotional wellness.   Suicidal/Homicidal: No  Therapist Response: Pt is continuing to apply interventions learned in session into daily life situations. Pt is currently on track to meet goals utilizing interventions mentioned above. Personal growth and progress noted. Treatment to continue as indicated.    Plan: Return again in 4 weeks.  Diagnosis: Axis I: Bipolar, mixed    Axis II: No diagnosis    [place], LCSW 07/06/2021

## 2021-07-11 ENCOUNTER — Other Ambulatory Visit: Payer: Self-pay

## 2021-07-11 ENCOUNTER — Ambulatory Visit: Payer: Medicare Other | Admitting: Psychiatry

## 2021-07-11 ENCOUNTER — Encounter (INDEPENDENT_AMBULATORY_CARE_PROVIDER_SITE_OTHER): Payer: Self-pay | Admitting: Family Medicine

## 2021-07-11 ENCOUNTER — Ambulatory Visit (INDEPENDENT_AMBULATORY_CARE_PROVIDER_SITE_OTHER): Payer: Medicare Other | Admitting: Family Medicine

## 2021-07-11 VITALS — BP 127/85 | HR 89 | Temp 98.1°F | Ht 66.0 in | Wt 209.0 lb

## 2021-07-11 DIAGNOSIS — Z6833 Body mass index (BMI) 33.0-33.9, adult: Secondary | ICD-10-CM

## 2021-07-11 DIAGNOSIS — E669 Obesity, unspecified: Secondary | ICD-10-CM

## 2021-07-11 DIAGNOSIS — R632 Polyphagia: Secondary | ICD-10-CM | POA: Diagnosis not present

## 2021-07-11 DIAGNOSIS — F3163 Bipolar disorder, current episode mixed, severe, without psychotic features: Secondary | ICD-10-CM | POA: Diagnosis not present

## 2021-07-11 DIAGNOSIS — E1021 Type 1 diabetes mellitus with diabetic nephropathy: Secondary | ICD-10-CM | POA: Diagnosis not present

## 2021-07-13 NOTE — Progress Notes (Signed)
Chief Complaint:   OBESITY [name] is here to discuss her progress with her obesity treatment plan along with follow-up of her obesity related diagnoses.   Today's visit was #: 21 Starting weight: 199 lbs Starting date: 11/13/2019 Today's weight: 209 lbs Today's date: 07/11/2021 Weight change since last visit: +2 lbs Total lbs lost to date: +10 lbs Body mass index is 33.73 kg/m.   Current Meal Plan: keeping a food journal and adhering to recommended goals of 1500 calories and 95 grams of protein for 100% of the time.  Current Exercise Plan: Increased walking for 3-5 times per week.  Interim History:  [name] saw Dr. Buddy Duty in Nephrology for CKD.  A1c was 9.4 last week.  Urine microalbumin elevated.  Considering adding ARB back - history of hypotension with enalapril 20 mg.  CR ratio >600.  FUCR 20, FUMA 18.84.  She is currently on her menses.  She says she is back on Thrive.  Says she "feels better", but her weight is "back and forth".  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 Medication: Novolog, insulin pump. Issues reviewed: blood sugar goals, complications of diabetes mellitus, hypoglycemia prevention and treatment, exercise, and nutrition.   Plan: The patient will continue to focus on protein-rich, low simple carbohydrate foods. We reviewed the importance of hydration, regular exercise for stress reduction, and restorative sleep.   Lab Results  Component Value Date   HGBA1C 9.2 03/17/2021   HGBA1C 9.8 (H) 07/29/2020   HGBA1C 9.5 (H) 04/15/2020   Lab Results  Component Value Date   LDLCALC 91 04/04/2021   CREATININE 1.25 (H) 04/04/2021   2. Polyphagia Controlled. Current treatment: phentermine 37.5 mg daily. She will continue to focus on protein-rich, low simple carbohydrate foods. We reviewed the importance of hydration, regular exercise for stress reduction, and restorative sleep.  3. Bipolar 1 disorder,  mixed, severe, with situational stress Will continue to follow along as it relates to her weight loss journey.  4. Obesity, current BMI 33.7  Course: [name] is currently in the action stage of change. As such, her goal is to continue with weight loss efforts.   Nutrition goals: She has agreed to following a lower carbohydrate, vegetable and lean protein rich diet plan.   Exercise goals: For substantial health benefits, adults should do at least 150 minutes (2 hours and 30 minutes) a week of moderate-intensity, or 75 minutes (1 hour and 15 minutes) a week of vigorous-intensity aerobic physical activity, or an equivalent combination of moderate- and vigorous-intensity aerobic activity. Aerobic activity should be performed in episodes of at least 10 minutes, and preferably, it should be spread throughout the week.  Behavioral modification strategies: increasing lean protein intake, decreasing simple carbohydrates, increasing vegetables, and increasing water intake.  [name] has agreed to follow-up with our clinic in 4 weeks. She was informed of the importance of frequent follow-up visits to maximize her success with intensive lifestyle modifications for her multiple health conditions.   Objective:   Blood pressure 127/85, pulse 89, temperature 98.1 F (36.7 C), temperature source Oral, height 5\' 6"  (1.676 m), weight 209 lb (94.8 kg), SpO2 98 %. Body mass index is 33.73 kg/m.  General: Cooperative, alert, well developed, in no acute distress. HEENT: Conjunctivae and lids unremarkable. Cardiovascular: Regular rhythm.  Lungs: Normal work of breathing. Neurologic: No focal deficits.   Lab Results  Component Value Date   CREATININE 1.25 (H) 04/04/2021   BUN 18  04/04/2021   NA 137 04/04/2021   K 4.2 04/04/2021   CL 99 04/04/2021   CO2 21 04/04/2021   Lab Results  Component Value Date   ALT 16 04/04/2021   AST 18 04/04/2021   ALKPHOS 155 (H) 04/04/2021   BILITOT 0.4 04/04/2021    Lab Results  Component Value Date   HGBA1C 9.2 03/17/2021   HGBA1C 9.8 (H) 07/29/2020   HGBA1C 9.5 (H) 04/15/2020   HGBA1C 8.0 (H) 09/09/2019   HGBA1C 9.0 04/05/2017   Lab Results  Component Value Date   TSH 1.650 04/04/2021   Lab Results  Component Value Date   CHOL 200 (H) 04/04/2021   HDL 102 04/04/2021   LDLCALC 91 04/04/2021   TRIG 36 04/04/2021   CHOLHDL 2.0 04/04/2021   Lab Results  Component Value Date   VD25OH 39.4 04/04/2021   VD25OH 32.0 06/30/2020   VD25OH 40.5 03/01/2020   Lab Results  Component Value Date   WBC 7.6 04/04/2021   HGB 15.2 04/04/2021   HCT 45.8 04/04/2021   MCV 87 04/04/2021   PLT 287 04/04/2021   Lab Results  Component Value Date   IRON 41 11/13/2019   TIBC 267 11/13/2019   FERRITIN 77 11/13/2019   Attestation Statements:   Reviewed by clinician on day of visit: allergies, medications, problem list, medical history, surgical history, family history, social history, and previous encounter notes.  Time spent on visit including pre-visit chart review and post-visit care and documentation was 45 minutes. Time was spent on: Food choices and timing of food intake reviewed today. I discussed a personalized meal plan with the patient that will help her to lose weight and will improve her obesity-related conditions going forward. I performed a medically necessary appropriate examination and/or evaluation. I discussed the assessment and treatment plan with the patient. Motivational interviewing as well as evidence-based interventions for health behavior change were utilized today including the discussion of self monitoring techniques, problem-solving barriers and SMART goal setting techniques.  An exercise prescription was reviewed.  The patient was provided an opportunity to ask questions and all were answered. The patient agreed with the plan and demonstrated an understanding of the instructions. Clinical information was updated and documented in  the EMR.  I, Water quality scientist, CMA, am acting as transcriptionist for Briscoe Deutscher, DO  I have reviewed the above documentation for accuracy and completeness, and I agree with the above. -  Briscoe Deutscher, DO, MS, FAAFP, DABOM - Family and Bariatric Medicine.

## 2021-07-15 ENCOUNTER — Other Ambulatory Visit: Payer: Self-pay

## 2021-07-15 ENCOUNTER — Ambulatory Visit (INDEPENDENT_AMBULATORY_CARE_PROVIDER_SITE_OTHER): Payer: Medicare Other | Admitting: Orthopedic Surgery

## 2021-07-15 DIAGNOSIS — R202 Paresthesia of skin: Secondary | ICD-10-CM

## 2021-07-15 DIAGNOSIS — G5602 Carpal tunnel syndrome, left upper limb: Secondary | ICD-10-CM

## 2021-07-15 DIAGNOSIS — G5601 Carpal tunnel syndrome, right upper limb: Secondary | ICD-10-CM | POA: Diagnosis not present

## 2021-07-15 DIAGNOSIS — R200 Anesthesia of skin: Secondary | ICD-10-CM | POA: Diagnosis not present

## 2021-07-16 ENCOUNTER — Encounter: Payer: Self-pay | Admitting: Orthopedic Surgery

## 2021-07-16 NOTE — Progress Notes (Signed)
Office Visit Note   Patient: [name]           Date of Birth: [birth year]           MRN: [id number] Visit Date: 07/15/2021 Requested by: Dede Query, PA-C [id number] [place],  [place] [address] PCP: Pete Glatter  Subjective: Chief Complaint  Patient presents with   Right Wrist - Follow-up    HPI: [name] is a 35 year old patient with severe right carpal tunnel syndrome.  Reports decreased strength and loss of dexterity.  EMG nerve study is reviewed which shows moderate to severe carpal tunnel syndrome.  She had left carpal tunnel release done and did well with that several years ago.  She does use a night splint but it bothers her more during the day.  She is on disability.  She has a 75-year-old and a 36 year old.              ROS: All systems reviewed are negative as they relate to the chief complaint within the history of present illness.  Patient denies  fevers or chills.   Assessment & Plan: Visit Diagnoses:  1. Numbness and tingling in right hand   2. Carpal tunnel syndrome, left upper limb   3. Carpal tunnel syndrome, right upper limb     Plan: Impression is right carpal tunnel syndrome.  Risks and benefits of operative and nonoperative therapy are discussed.  Patient has had left carpal tunnel release performed.  She would like to have a right carpal tunnel surgery performed.  Risk and benefits are discussed including not limited to infection nerve vessel damage incomplete resolution of symptoms as well as loss of grip strength for several months.  Patient understands the risk and benefits and wishes to proceed.  Follow-Up Instructions: No follow-ups on file.   Orders:  No orders of the defined types were placed in this encounter.  No orders of the defined types were placed in this encounter.     Procedures: No procedures performed   Clinical Data: No additional findings.  Objective: Vital Signs: There were no vitals  taken for this visit.  Physical Exam:   Constitutional: Patient appears well-developed HEENT:  Head: Normocephalic Eyes:EOM are normal Neck: Normal range of motion Cardiovascular: Normal rate Pulmonary/chest: Effort normal Neurologic: Patient is alert Skin: Skin is warm Psychiatric: Patient has normal mood and affect   Ortho Exam: Ortho exam demonstrates full active and passive range of motion of the wrist.  Abductor pollicis brevis strength is intact.  Radial pulses intact.  No masses lymphadenopathy or skin changes noted in that right wrist region.  Positive Tinel's in the carpal tunnel.  Elbow has full range of motion with no subluxation of the ulnar nerve.  Radial pulse is intact.  Specialty Comments:  No specialty comments available.  Imaging: No results found.   PMFS History: Patient Active Problem List   Diagnosis Date Noted   Iron deficiency anemia 12/19/2020   Long term (current) use of insulin ([place]) 12/19/2020   Migraine 11/15/2020   CKD stage 3 secondary to diabetes ([place]) 11/15/2020   Diabetic neuropathy ([place]) 11/15/2020   Type 1 diabetes mellitus with stage 3b chronic kidney disease ([place]) 08/27/2020   Polyphagia 08/27/2020   Bipolar 1 disorder, mixed, severe ([place]) 08/27/2020   Type 1 diabetes mellitus with hyperglycemia ([place]) 08/27/2020   Financial difficulties 08/27/2020   Drug-induced weight gain 08/27/2020   Traction detachment of right retina 08/27/2020  Vitreous hemorrhage of left eye ([place]) 08/27/2020   Hypertension associated with diabetes ([place]) 08/27/2020   Type 1 diabetes mellitus with diabetic polyneuropathy 08/27/2020   Class 1 obesity with serious comorbidity and body mass index (BMI) of 33.0 to 33.9 in adult 08/27/2020   Status post eye surgery 07/01/2020   Chronic migraine without aura 01/22/2020   S/P eye surgery 08/29/2019   Diplopia 07/23/2019   Epiretinal membrane (ERM) of left eye 07/23/2019   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/2019   Pseudophakia of left eye 11/04/2018   Chronic kidney disease, stage 3b ([place]) 01/01/2017   Diabetic nephropathy associated with type 1 diabetes mellitus ([place]) 03/01/2016   Drug overdose, intentional ([place]) 03/27/2014   Asthma 11/10/2013   MDD (major depressive disorder), recurrent episode, severe ([place]) 09/26/2013   Generalized anxiety disorder 09/26/2013   Aphakia, right eye 09/14/2012   Retinal detachment, tractional, left eye 06/11/2012   Eczema 06/07/2012   Diabetic retinopathy associated with type 1 diabetes mellitus ([place]) 04/30/2012   H/O insertion of insulin pump 04/30/2012   History of atrial tachycardia 08/08/2010   Gastroparesis due to DM ([place]) 08/08/2010   Past Medical History:  Diagnosis Date   Anemia    Anemia of chronic disease 06/07/2012   Formatting of this note might be different from the original. Hematology consultation 07-13-16 (see note)--> Anemia of chronic disease Required 3 transfusions during pregnancy  Last Assessment & Plan:  Formatting of this note might be different from the original. She has now required 3 blood transfusions this pregnancy for her anemia of chronic disease. The most recent was one week ago when she bec   Anxiety    Asthma    Asthma 11/10/2013   Back pain    Bipolar 1 disorder (HCC)    Chest pain    Chronic renal failure syndrome, stage 3 (moderate) (HCC)    Colles' fracture of left radius 06/13/7095   Complication of anesthesia    Constipation    Depression    Depression    Diabetic gastroparesis (HCC)    Diabetic neuropathy, type I diabetes mellitus ([place])    Diabetic ulcer of right great toe ([place]) 08/27/2020   Edema, lower extremity    Gallbladder disease    Gastroparesis    GERD (gastroesophageal reflux disease)    Headache(784.0)    Hypertension    Joint pain    Kidney stones    Palpitations    Polyneuropathy in diabetes(357.2)    PONV (postoperative nausea and vomiting)    Retinopathy due  to secondary diabetes ([place])    S/P carpal tunnel release left 10/16/19 11/04/2019   Shortness of breath    Tachycardia    baseline tachycardia    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[place])     Family History  Problem Relation Age of Onset   Hypertension Other    Diabetes Mother        type 2   Hypertension Mother    Obesity Mother    Diabetes Brother        type 1   Renal Disease Brother        renal failure   Hypertension Brother    Diabetes Father    Hypertension Father    High Cholesterol Father    Sleep apnea Father    Obesity Father    Heart disease Father    Breast cancer Maternal Aunt 74  Colon cancer Maternal Aunt    Uterine cancer Maternal Aunt 65       same as breast cancer   Uterine cancer Maternal Aunt 61   Rectal cancer Neg Hx    Esophageal cancer Neg Hx     Past Surgical History:  Procedure Laterality Date   ANAL RECTAL MANOMETRY N/A 03/25/2018   Procedure: ANO RECTAL MANOMETRY;  Surgeon: Doran Stabler, MD;  Location: [facility];  Service: Gastroenterology;  Laterality: N/A;   CARPAL TUNNEL RELEASE Left 10/16/2019   Procedure: LEFT CARPAL TUNNEL RELEASE;  Surgeon: Carole Civil, MD;  Location: [facility];  Service: Orthopedics;  Laterality: Left;   CESAREAN SECTION     x 2   CHOLECYSTECTOMY     EYE SURGERY     REFRACTIVE SURGERY Bilateral    Social History   Occupational History   Occupation: Home maker  Tobacco Use   Smoking status: Never   Smokeless tobacco: Never  Vaping Use   Vaping Use: Never used  Substance and Sexual Activity   Alcohol use: No   Drug use: No   Sexual activity: Yes    Partners: Male    Birth control/protection: I.U.D.

## 2021-07-24 ENCOUNTER — Other Ambulatory Visit: Payer: Self-pay | Admitting: Surgical

## 2021-07-25 ENCOUNTER — Other Ambulatory Visit: Payer: Self-pay | Admitting: Surgical

## 2021-07-25 DIAGNOSIS — G5601 Carpal tunnel syndrome, right upper limb: Secondary | ICD-10-CM | POA: Diagnosis not present

## 2021-07-25 MED ORDER — TRAMADOL HCL 50 MG PO TABS: 50.0000 mg | ORAL_TABLET | Freq: Four times a day (QID) | ORAL | 0 refills | Status: DC | PRN

## 2021-07-26 ENCOUNTER — Telehealth: Payer: Self-pay | Admitting: Orthopedic Surgery

## 2021-07-26 ENCOUNTER — Other Ambulatory Visit: Payer: Self-pay | Admitting: Surgical

## 2021-07-26 ENCOUNTER — Telehealth: Payer: Self-pay

## 2021-07-26 MED ORDER — HYDROCODONE-ACETAMINOPHEN 5-325 MG PO TABS: 1.0000 | ORAL_TABLET | ORAL | 0 refills | Status: DC | PRN

## 2021-07-26 NOTE — Telephone Encounter (Signed)
Pt called requesting new pain medication. Pt states tramadol is not touching her pain and making her nauseated.Please send new medication to pharmacy on file.  Pt also states her fingers are numb. Please call pt about his matter at [phone].

## 2021-07-26 NOTE — Telephone Encounter (Signed)
Patient called, states the Tramadol isn't helping with her CTR pain, she was up off and on lastnight; states she can't take NSAIDS because of her kidneys [facility]

## 2021-07-26 NOTE — Telephone Encounter (Signed)
Sent in RX for [name] her to inform her and she said this is okay and works for her.  She will call if any issues prior to her f/u appt

## 2021-08-02 ENCOUNTER — Encounter (INDEPENDENT_AMBULATORY_CARE_PROVIDER_SITE_OTHER): Payer: Self-pay | Admitting: Family Medicine

## 2021-08-02 NOTE — Telephone Encounter (Signed)
Dr.Wallace °

## 2021-08-03 ENCOUNTER — Other Ambulatory Visit: Payer: Self-pay

## 2021-08-03 ENCOUNTER — Ambulatory Visit (INDEPENDENT_AMBULATORY_CARE_PROVIDER_SITE_OTHER): Payer: Medicare Other | Admitting: Orthopedic Surgery

## 2021-08-03 ENCOUNTER — Telehealth: Payer: Self-pay

## 2021-08-03 ENCOUNTER — Encounter: Payer: Self-pay | Admitting: Orthopedic Surgery

## 2021-08-03 ENCOUNTER — Encounter (INDEPENDENT_AMBULATORY_CARE_PROVIDER_SITE_OTHER): Payer: Self-pay

## 2021-08-03 DIAGNOSIS — M65312 Trigger thumb, left thumb: Secondary | ICD-10-CM

## 2021-08-03 DIAGNOSIS — R200 Anesthesia of skin: Secondary | ICD-10-CM

## 2021-08-03 DIAGNOSIS — R202 Paresthesia of skin: Secondary | ICD-10-CM

## 2021-08-03 NOTE — Progress Notes (Signed)
Post-Op Visit Note   Patient: [name]           Date of Birth: 11-Oct-1985           MRN: [id number] Visit Date: 08/03/2021 PCP: Dede Query, PA-C   Assessment & Plan:  Chief Complaint:  Chief Complaint  Patient presents with   Right Wrist - Routine Post Op    07/25/21 right CTR   Visit Diagnoses:  1. Numbness and tingling in right hand   2. Trigger finger of left thumb     Plan: Patient is a 35 year old female who presents s/p right carpal tunnel release on 07/25/2021.  She reports pain is well controlled currently but she did have some increased pain following the surgery initially.  She was having a lot of numbness and tingling in her fingertips immediately following the surgery but now all that has resolved and her symptoms are significantly improved compared with preop.  Denies any motor dysfunction in her hand.  No fevers, chills, drainage.  On exam she has palpable radial pulse of the operative extremity.  Intact EPL, wrist extension, finger abduction, finger adduction, FPL.  Incision looks to be healing well without any evidence of infection or dehiscence.  Sutures intact.  Sutures removed and replaced with Steri-Strips.  There is no significant gapping of the incision after removal of the sutures.  Plan is to hold off on any submersion underwater.  No lifting more than 2 pounds with the operative hand.  Follow-up in 4 weeks for clinical recheck.  Additionally, she complains of left trigger thumb which she has dealt with in the past.  She has had 1 prior injection that provided 6 months of relief of symptoms.  She would like to have this injected again.  Plan to do this at her next office visit.  Additionally, she has 3 days of increased pain and tenderness in the base of the index finger on the left hand on the palmar side over the A1 pulley.  There is no triggering observed.  Suspect that this is an early trigger finger as well given her history and  presentation.  She will try Voltaren gel on these most tender spots over the next several weeks and follow-up in 4 weeks for clinical recheck.  Follow-Up Instructions: No follow-ups on file.   Orders:  No orders of the defined types were placed in this encounter.  No orders of the defined types were placed in this encounter.   Imaging: No results found.  PMFS History: Patient Active Problem List   Diagnosis Date Noted   Iron deficiency anemia 12/19/2020   Long term (current) use of insulin ([place]) 12/19/2020   Migraine 11/15/2020   CKD stage 3 secondary to diabetes ([place]) 11/15/2020   Diabetic neuropathy ([place]) 11/15/2020   Type 1 diabetes mellitus with stage 3b chronic kidney disease ([place]) 08/27/2020   Polyphagia 08/27/2020   Bipolar 1 disorder, mixed, severe ([place]) 08/27/2020   Type 1 diabetes mellitus with hyperglycemia ([place]) 08/27/2020   Financial difficulties 08/27/2020   Drug-induced weight gain 08/27/2020   Traction detachment of right retina 08/27/2020   Vitreous hemorrhage of left eye ([place]) 08/27/2020   Hypertension associated with diabetes ([place]) 08/27/2020   Type 1 diabetes mellitus with diabetic polyneuropathy 08/27/2020   Class 1 obesity with serious comorbidity and body mass index (BMI) of 33.0 to 33.9 in adult 08/27/2020   Status post eye surgery 07/01/2020   Chronic migraine without aura 01/22/2020   S/P  eye surgery 08/29/2019   Diplopia 07/23/2019   Epiretinal membrane (ERM) of left eye 07/23/2019   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/2019   Pseudophakia of left eye 11/04/2018   Chronic kidney disease, stage 3b ([place]) 01/01/2017   Diabetic nephropathy associated with type 1 diabetes mellitus ([place]) 03/01/2016   Drug overdose, intentional ([place]) 03/27/2014   Asthma 11/10/2013   MDD (major depressive disorder), recurrent episode, severe ([place]) 09/26/2013   Generalized anxiety disorder 09/26/2013   Aphakia, right eye 09/14/2012    Retinal detachment, tractional, left eye 06/11/2012   Eczema 06/07/2012   Diabetic retinopathy associated with type 1 diabetes mellitus ([place]) 04/30/2012   H/O insertion of insulin pump 04/30/2012   History of atrial tachycardia 08/08/2010   Gastroparesis due to DM ([place]) 08/08/2010   Past Medical History:  Diagnosis Date   Anemia    Anemia of chronic disease 06/07/2012   Formatting of this note might be different from the original. Hematology consultation 07-13-16 (see note)--> Anemia of chronic disease Required 3 transfusions during pregnancy  Last Assessment & Plan:  Formatting of this note might be different from the original. She has now required 3 blood transfusions this pregnancy for her anemia of chronic disease. The most recent was one week ago when she bec   Anxiety    Asthma    Asthma 11/10/2013   Back pain    Bipolar 1 disorder (HCC)    Chest pain    Chronic renal failure syndrome, stage 3 (moderate) (HCC)    Colles' fracture of left radius 04/11/1286   Complication of anesthesia    Constipation    Depression    Depression    Diabetic gastroparesis (HCC)    Diabetic neuropathy, type I diabetes mellitus ([place])    Diabetic ulcer of right great toe ([place]) 08/27/2020   Edema, lower extremity    Gallbladder disease    Gastroparesis    GERD (gastroesophageal reflux disease)    Headache(784.0)    Hypertension    Joint pain    Kidney stones    Palpitations    Polyneuropathy in diabetes(357.2)    PONV (postoperative nausea and vomiting)    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[place])    S/P carpal tunnel release left 10/16/19 11/04/2019   Shortness of breath    Tachycardia    baseline tachycardia    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[place])     Family History  Problem Relation Age of Onset   Hypertension Other    Diabetes Mother        type 2   Hypertension Mother    Obesity Mother    Diabetes Brother        type 1   Renal Disease  Brother        renal failure   Hypertension Brother    Diabetes Father    Hypertension Father    High Cholesterol Father    Sleep apnea Father    Obesity Father    Heart disease Father    Breast cancer Maternal Aunt 64   Colon cancer Maternal Aunt    Uterine cancer Maternal Aunt 65       same as breast cancer   Uterine cancer Maternal Aunt 61   Rectal cancer Neg Hx    Esophageal cancer Neg Hx     Past Surgical History:  Procedure Laterality Date   ANAL RECTAL MANOMETRY N/A 03/25/2018  Procedure: ANO RECTAL MANOMETRY;  Surgeon: Doran Stabler, MD;  Location: [facility];  Service: Gastroenterology;  Laterality: N/A;   CARPAL TUNNEL RELEASE Left 10/16/2019   Procedure: LEFT CARPAL TUNNEL RELEASE;  Surgeon: Carole Civil, MD;  Location: [facility];  Service: Orthopedics;  Laterality: Left;   CESAREAN SECTION     x 2   CHOLECYSTECTOMY     EYE SURGERY     REFRACTIVE SURGERY Bilateral    Social History   Occupational History   Occupation: Home maker  Tobacco Use   Smoking status: Never   Smokeless tobacco: Never  Vaping Use   Vaping Use: Never used  Substance and Sexual Activity   Alcohol use: No   Drug use: No   Sexual activity: Yes    Partners: Male    Birth control/protection: I.U.D.

## 2021-08-03 NOTE — Telephone Encounter (Signed)
Patient had left a Vm concerning her hand bleeding. Stated that she needed to know what she should do.  CB# [phone].  Please advise.  Thank you.

## 2021-08-04 NOTE — Telephone Encounter (Signed)
Contacted patient and she states that she noticed active bleeding after her sutures were removed. She applied guaze and ace wrap. Informed patient that once she unwraps her hand and she notices active bleeding is continuing that she is to contact our office and ask for [facility] and I will place her on the schedule for a nurse visit Patient understood and had no further questions or concerns.

## 2021-08-04 NOTE — Telephone Encounter (Signed)
She came in today, Rreturn on wedenesday

## 2021-08-05 DIAGNOSIS — F3170 Bipolar disorder, currently in remission, most recent episode unspecified: Secondary | ICD-10-CM | POA: Diagnosis not present

## 2021-08-05 DIAGNOSIS — Z0000 Encounter for general adult medical examination without abnormal findings: Secondary | ICD-10-CM | POA: Diagnosis not present

## 2021-08-05 DIAGNOSIS — G43909 Migraine, unspecified, not intractable, without status migrainosus: Secondary | ICD-10-CM | POA: Diagnosis not present

## 2021-08-05 DIAGNOSIS — B001 Herpesviral vesicular dermatitis: Secondary | ICD-10-CM | POA: Diagnosis not present

## 2021-08-05 DIAGNOSIS — J4540 Moderate persistent asthma, uncomplicated: Secondary | ICD-10-CM | POA: Diagnosis not present

## 2021-08-05 DIAGNOSIS — R41840 Attention and concentration deficit: Secondary | ICD-10-CM | POA: Diagnosis not present

## 2021-08-05 DIAGNOSIS — Z2821 Immunization not carried out because of patient refusal: Secondary | ICD-10-CM | POA: Diagnosis not present

## 2021-08-05 DIAGNOSIS — E1021 Type 1 diabetes mellitus with diabetic nephropathy: Secondary | ICD-10-CM | POA: Diagnosis not present

## 2021-08-05 DIAGNOSIS — R413 Other amnesia: Secondary | ICD-10-CM | POA: Diagnosis not present

## 2021-08-05 DIAGNOSIS — E1065 Type 1 diabetes mellitus with hyperglycemia: Secondary | ICD-10-CM | POA: Diagnosis not present

## 2021-08-08 NOTE — Telephone Encounter (Signed)
[name] is still considered to be non-formulary per insurance.

## 2021-08-10 ENCOUNTER — Ambulatory Visit (INDEPENDENT_AMBULATORY_CARE_PROVIDER_SITE_OTHER): Payer: Medicare Other | Admitting: Surgical

## 2021-08-10 ENCOUNTER — Other Ambulatory Visit: Payer: Self-pay

## 2021-08-10 ENCOUNTER — Encounter: Payer: Self-pay | Admitting: Orthopedic Surgery

## 2021-08-10 ENCOUNTER — Ambulatory Visit: Payer: Medicare Other

## 2021-08-10 ENCOUNTER — Ambulatory Visit (INDEPENDENT_AMBULATORY_CARE_PROVIDER_SITE_OTHER): Payer: Medicare Other | Admitting: Family Medicine

## 2021-08-10 DIAGNOSIS — G5601 Carpal tunnel syndrome, right upper limb: Secondary | ICD-10-CM

## 2021-08-10 NOTE — Progress Notes (Addendum)
Post-Op Visit Note   Patient: [name]           Date of Birth: Apr 13, 1986           MRN: [id number] Visit Date: 08/10/2021 PCP: Dede Query, PA-C   Assessment & Plan:  Chief Complaint:  Chief Complaint  Patient presents with   Routine Post Op      07/25/21 right CTR     Visit Diagnoses:  1. Carpal tunnel syndrome, right upper limb     Plan: Patient is a 35 year old female who presents s/p right carpal tunnel release on 07/25/2021.  She had superficial dehiscence of incision that was reviewed last week in nursing visit.  She returns for incision check.  No surrounding erythema.  She denies any fevers, chills, night sweats.  She has had scant drainage but this has resolved as of several days ago.  There is no expressible drainage on exam today.  Incision looks to be healing well with skin edges approximated and just very slight gapping of the proximal aspect of the incision.  Overall, it looks to be progressing well and there is no sign of infection.  Hold off on lifting anything with the operative hand and follow-up in about 10 days for recheck on the incision.  She is aware of the red flags of infection and she will call the office if she starts to notice any changes.  Also consider left trigger thumb injection at her next appointment.  She has had 1 prior injection.  The index finger pain that she was having has completely resolved.     Follow-Up Instructions: No follow-ups on file.   Orders:  No orders of the defined types were placed in this encounter.  No orders of the defined types were placed in this encounter.   Imaging: No results found.  PMFS History: Patient Active Problem List   Diagnosis Date Noted   Iron deficiency anemia 12/19/2020   Long term (current) use of insulin ([place]) 12/19/2020   Migraine 11/15/2020   CKD stage 3 secondary to diabetes ([place]) 11/15/2020   Diabetic neuropathy ([place]) 11/15/2020   Type 1 diabetes mellitus with  stage 3b chronic kidney disease ([place]) 08/27/2020   Polyphagia 08/27/2020   Bipolar 1 disorder, mixed, severe ([place]) 08/27/2020   Type 1 diabetes mellitus with hyperglycemia ([place]) 08/27/2020   Financial difficulties 08/27/2020   Drug-induced weight gain 08/27/2020   Traction detachment of right retina 08/27/2020   Vitreous hemorrhage of left eye ([place]) 08/27/2020   Hypertension associated with diabetes ([place]) 08/27/2020   Type 1 diabetes mellitus with diabetic polyneuropathy 08/27/2020   Class 1 obesity with serious comorbidity and body mass index (BMI) of 33.0 to 33.9 in adult 08/27/2020   Status post eye surgery 07/01/2020   Chronic migraine without aura 01/22/2020   S/P eye surgery 08/29/2019   Diplopia 07/23/2019   Epiretinal membrane (ERM) of left eye 07/23/2019   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/2019   Pseudophakia of left eye 11/04/2018   Chronic kidney disease, stage 3b ([place]) 01/01/2017   Diabetic nephropathy associated with type 1 diabetes mellitus ([place]) 03/01/2016   Drug overdose, intentional ([place]) 03/27/2014   Asthma 11/10/2013   MDD (major depressive disorder), recurrent episode, severe ([place]) 09/26/2013   Generalized anxiety disorder 09/26/2013   Aphakia, right eye 09/14/2012   Retinal detachment, tractional, left eye 06/11/2012   Eczema 06/07/2012   Diabetic retinopathy associated with type 1 diabetes  mellitus ([place]) 04/30/2012   H/O insertion of insulin pump 04/30/2012   History of atrial tachycardia 08/08/2010   Gastroparesis due to DM ([place]) 08/08/2010   Past Medical History:  Diagnosis Date   Anemia    Anemia of chronic disease 06/07/2012   Formatting of this note might be different from the original. Hematology consultation 07-13-16 (see note)--> Anemia of chronic disease Required 3 transfusions during pregnancy  Last Assessment & Plan:  Formatting of this note might be different from the original. She has now required 3 blood  transfusions this pregnancy for her anemia of chronic disease. The most recent was one week ago when she bec   Anxiety    Asthma    Asthma 11/10/2013   Back pain    Bipolar 1 disorder (HCC)    Chest pain    Chronic renal failure syndrome, stage 3 (moderate) (HCC)    Colles' fracture of left radius 02/14/5276   Complication of anesthesia    Constipation    Depression    Depression    Diabetic gastroparesis (HCC)    Diabetic neuropathy, type I diabetes mellitus ([place])    Diabetic ulcer of right great toe ([place]) 08/27/2020   Edema, lower extremity    Gallbladder disease    Gastroparesis    GERD (gastroesophageal reflux disease)    Headache(784.0)    Hypertension    Joint pain    Kidney stones    Palpitations    Polyneuropathy in diabetes(357.2)    PONV (postoperative nausea and vomiting)    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[employer])    S/P carpal tunnel release left 10/16/19 11/04/2019   Shortness of breath    Tachycardia    baseline tachycardia    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[place])     Family History  Problem Relation Age of Onset   Hypertension Other    Diabetes Mother        type 2   Hypertension Mother    Obesity Mother    Diabetes Brother        type 1   Renal Disease Brother        renal failure   Hypertension Brother    Diabetes Father    Hypertension Father    High Cholesterol Father    Sleep apnea Father    Obesity Father    Heart disease Father    Breast cancer Maternal Aunt 64   Colon cancer Maternal Aunt    Uterine cancer Maternal Aunt 65       same as breast cancer   Uterine cancer Maternal Aunt 61   Rectal cancer Neg Hx    Esophageal cancer Neg Hx     Past Surgical History:  Procedure Laterality Date   ANAL RECTAL MANOMETRY N/A 03/25/2018   Procedure: ANO RECTAL MANOMETRY;  Surgeon: Doran Stabler, MD;  Location: [facility];  Service: Gastroenterology;  Laterality: N/A;   CARPAL TUNNEL RELEASE Left  10/16/2019   Procedure: LEFT CARPAL TUNNEL RELEASE;  Surgeon: Carole Civil, MD;  Location: [facility];  Service: Orthopedics;  Laterality: Left;   CESAREAN SECTION     x 2   CHOLECYSTECTOMY     EYE SURGERY     REFRACTIVE SURGERY Bilateral    Social History   Occupational History   Occupation: Home maker  Tobacco Use   Smoking status: Never   Smokeless tobacco: Never  Vaping Use   Vaping Use: Never used  Substance and Sexual Activity   Alcohol use: No   Drug use: No   Sexual activity: Yes    Partners: Male    Birth control/protection: I.U.D.

## 2021-08-11 ENCOUNTER — Telehealth (HOSPITAL_COMMUNITY): Payer: Self-pay | Admitting: Psychiatry

## 2021-08-11 ENCOUNTER — Ambulatory Visit (INDEPENDENT_AMBULATORY_CARE_PROVIDER_SITE_OTHER): Payer: Medicare Other | Admitting: Licensed Clinical Social Worker

## 2021-08-11 DIAGNOSIS — F411 Generalized anxiety disorder: Secondary | ICD-10-CM | POA: Diagnosis not present

## 2021-08-11 DIAGNOSIS — F3162 Bipolar disorder, current episode mixed, moderate: Secondary | ICD-10-CM

## 2021-08-11 NOTE — Telephone Encounter (Signed)
D:  Cristina Hussami, LCSW referred pt to [facility].  A:  Placed call to orient pt and provide her with a start date, but pt states she needs to think about doing it first.  Pt voiced financial concerns b/c [facility] doesn't accept Medicaid.  Pt's primary is Medicare.  Explained to pt that Medicare would cover 80%.  Encouraged pt to call the case manager back if she has further questions or would like to try the group.  Inform Cristina.

## 2021-08-13 NOTE — Progress Notes (Signed)
Virtual Visit via Audio Note  I connected with [name] on 08/11/21 at 10:00 AM EDT by an audio enabled telemedicine application and verified that I am speaking with the correct person using two identifiers.  Location: Patient: home Provider: [facility]   I discussed the limitations of evaluation and management by telemedicine and the availability of in person appointments. The patient expressed understanding and agreed to proceed.   I discussed the assessment and treatment plan with the patient. The patient was provided an opportunity to ask questions and all were answered. The patient agreed with the plan and demonstrated an understanding of the instructions.   The patient was advised to call back or seek an in-person evaluation if the symptoms worsen or if the condition fails to improve as anticipated.  I provided 45 minutes of non-face-to-face time during this encounter.   [place], LCSW   THERAPIST PROGRESS NOTE  Session Time: [id number]  Participation Level: Active  Behavioral Response: NAAlertAnxious and Depressed  Type of Therapy: Individual Therapy  Treatment Goals addressed:  Problem: Alleviate depressive/manic symptoms and return to improved levels of effective functioning. Goal: LTG: Stabilize mood and increase goal-directed behavior: Input needed on appropriate metric Outcome: Progressing Goal: STG: @PREFFIRSTNAME @ will attend at least 80% of scheduled follow-up counseling appointments Outcome: Progressing Interventions:  Intervention: [place] OF A RECENT EPISODE OF DEPRESSION: PHYSICAL SYMPTOMS, MAJOR THOUGHTS AND IMAGES, AND MAJOR BEHAVIORS THEY EXPERIENCED Intervention: REVIEW PLEASE SKILLS (TREAT PHYSICAL ILLNESS, BALANCE EATING, AVOID MOOD-ALTERING SUBSTANCES, BALANCE SLEEP AND GET EXERCISE) WITH Ashly Intervention: Encourage new environment or opportunities for social interaction Intervention:  Assess emotional status and coping mechanisms Intervention: Assist with relaxation techniques, as appropriate (deep breathing exercises, meditation, guided imagery)   Summary: [name] is a 35 y.o. female who presents with improving symptoms related to bipolar disorder diagnosis. Pt reports that currently her mood is stable but pt admits that some days she feels more depressed. Pt feels hopeless about living in the camper and really wants to live in a house.  Pt reports that  she is feeling stress about the upcoming holiday season and is wondering how she and husband are going to take care of presents for their children. Discussed importance of self care and pt taking time for herself when the children are at school.  Continued recommendations are as follows: self care behaviors, positive social engagements, focusing on overall work/home/life balance, and focusing on positive physical and emotional wellness. =     Suicidal/Homicidal: No  Therapist Response: Pt is continuing to apply interventions learned in session into daily life situations. Pt is currently on track to meet goals utilizing interventions mentioned above. Personal growth and progress noted. Treatment to continue as indicated.   Plan: Return again in 4 weeks.  Diagnosis: Axis I: Bipolar, mixed, moderate    Axis II: No diagnosis    Tees Toh, LCSW 08/13/2021

## 2021-08-13 NOTE — Plan of Care (Signed)
  Problem: Alleviate depressive/manic symptoms and return to improved levels of effective functioning. Goal: LTG: Stabilize mood and increase goal-directed behavior: Input needed on appropriate metric Outcome: Progressing Goal: STG: @PREFFIRSTNAME @ will attend at least 80% of scheduled follow-up counseling appointments Outcome: Progressing Intervention: WORK WITH Dollye TO IDENTIFY THE MAJOR COMPONENTS OF A RECENT EPISODE OF DEPRESSION: PHYSICAL SYMPTOMS, MAJOR THOUGHTS AND IMAGES, AND MAJOR BEHAVIORS THEY EXPERIENCED Intervention: REVIEW PLEASE SKILLS (TREAT PHYSICAL ILLNESS, BALANCE EATING, AVOID MOOD-ALTERING SUBSTANCES, BALANCE SLEEP AND GET EXERCISE) WITH [place] Intervention: Encourage new environment or opportunities for social interaction Intervention: Assess emotional status and coping mechanisms Intervention: Assist with relaxation techniques, as appropriate (deep breathing exercises, meditation, guided imagery)

## 2021-08-16 ENCOUNTER — Telehealth: Payer: Self-pay | Admitting: Orthopedic Surgery

## 2021-08-16 NOTE — Telephone Encounter (Signed)
Contacted patient and she has been scheduled to see Dr. Marlou Sa on 08/17/2021 at 3:30PM

## 2021-08-16 NOTE — Telephone Encounter (Signed)
Pt called and states her hand is tender to the touch. She thinks it looks like there is puss in there. She thinks it may be infected and would like to know if she can come in tomorrow instead of Friday.   CB [phone]

## 2021-08-17 ENCOUNTER — Ambulatory Visit (INDEPENDENT_AMBULATORY_CARE_PROVIDER_SITE_OTHER): Payer: Medicare Other | Admitting: Orthopedic Surgery

## 2021-08-17 ENCOUNTER — Encounter (INDEPENDENT_AMBULATORY_CARE_PROVIDER_SITE_OTHER): Payer: Self-pay | Admitting: Family Medicine

## 2021-08-17 ENCOUNTER — Other Ambulatory Visit: Payer: Self-pay

## 2021-08-17 DIAGNOSIS — G5601 Carpal tunnel syndrome, right upper limb: Secondary | ICD-10-CM

## 2021-08-18 NOTE — Telephone Encounter (Signed)
Pt last seen by Dr. Wallace.  

## 2021-08-19 ENCOUNTER — Ambulatory Visit: Payer: Medicare Other | Admitting: Surgical

## 2021-08-20 ENCOUNTER — Encounter: Payer: Self-pay | Admitting: Orthopedic Surgery

## 2021-08-20 NOTE — Progress Notes (Signed)
Post-Op Visit Note   Patient: [name]           Date of Birth: [birth year]           MRN: [id number] Visit Date: 08/17/2021 PCP: Cristina Query, PA-C   Assessment & Plan:  Chief Complaint:  Chief Complaint  Patient presents with   Right Wrist - Routine Post Op     07/25/21 right CTR   Visit Diagnoses:  1. Carpal tunnel syndrome, right upper limb     Plan: Cristina Guiles is a 35 year old patient who underwent carpal tunnel release 07/25/2021.  She is on disability.  She is [place].  Question about infection in the incision.  On exam her infection looks good.  She has expectedly diminished grip strength on the right-hand side.  No drainage.  No dependent erythema present.  Plan is to keep the appointment on the 23rd and hold off on left trigger thumb intervention for now.  Follow-Up Instructions: No follow-ups on file.   Orders:  No orders of the defined types were placed in this encounter.  No orders of the defined types were placed in this encounter.   Imaging: No results found.  PMFS History: Patient Active Problem List   Diagnosis Date Noted   Iron deficiency anemia 12/19/2020   Long term (current) use of insulin ([place]) 12/19/2020   Migraine 11/15/2020   CKD stage 3 secondary to diabetes ([place]) 11/15/2020   Diabetic neuropathy ([place]) 11/15/2020   Type 1 diabetes mellitus with stage 3b chronic kidney disease ([place]) 08/27/2020   Polyphagia 08/27/2020   Bipolar 1 disorder, mixed, severe ([place]) 08/27/2020   Type 1 diabetes mellitus with hyperglycemia ([place]) 08/27/2020   Financial difficulties 08/27/2020   Drug-induced weight gain 08/27/2020   Traction detachment of right retina 08/27/2020   Vitreous hemorrhage of left eye ([place]) 08/27/2020   Hypertension associated with diabetes ([place]) 08/27/2020   Type 1 diabetes mellitus with diabetic polyneuropathy 08/27/2020   Class 1 obesity with serious comorbidity and body mass index (BMI) of 33.0 to 33.9 in  adult 08/27/2020   Status post eye surgery 07/01/2020   Chronic migraine without aura 01/22/2020   S/P eye surgery 08/29/2019   Diplopia 07/23/2019   Epiretinal membrane (ERM) of left eye 07/23/2019   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/2019   Pseudophakia of left eye 11/04/2018   Chronic kidney disease, stage 3b ([place]) 01/01/2017   Diabetic nephropathy associated with type 1 diabetes mellitus ([place]) 03/01/2016   Drug overdose, intentional ([place]) 03/27/2014   Asthma 11/10/2013   MDD (major depressive disorder), recurrent episode, severe ([place]) 09/26/2013   Generalized anxiety disorder 09/26/2013   Aphakia, right eye 09/14/2012   Retinal detachment, tractional, left eye 06/11/2012   Eczema 06/07/2012   Diabetic retinopathy associated with type 1 diabetes mellitus ([place]) 04/30/2012   H/O insertion of insulin pump 04/30/2012   History of atrial tachycardia 08/08/2010   Gastroparesis due to DM ([place]) 08/08/2010   Past Medical History:  Diagnosis Date   Anemia    Anemia of chronic disease 06/07/2012   Formatting of this note might be different from the original. Hematology consultation 07-13-16 (see note)--> Anemia of chronic disease Required 3 transfusions during pregnancy  Last Assessment & Plan:  Formatting of this note might be different from the original. She has now required 3 blood transfusions this pregnancy for her anemia of chronic disease. The most recent was one week ago when she bec  Anxiety    Asthma    Asthma 11/10/2013   Back pain    Bipolar 1 disorder (HCC)    Chest pain    Chronic renal failure syndrome, stage 3 (moderate) (HCC)    Colles' fracture of left radius 05/10/5052   Complication of anesthesia    Constipation    Depression    Depression    Diabetic gastroparesis (HCC)    Diabetic neuropathy, type I diabetes mellitus ([place])    Diabetic ulcer of right great toe ([place]) 08/27/2020   Edema, lower extremity    Gallbladder disease     Gastroparesis    GERD (gastroesophageal reflux disease)    Headache(784.0)    Hypertension    Joint pain    Kidney stones    Palpitations    Polyneuropathy in diabetes(357.2)    PONV (postoperative nausea and vomiting)    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[place])    S/P carpal tunnel release left 10/16/19 11/04/2019   Shortness of breath    Tachycardia    baseline tachycardia    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[place])     Family History  Problem Relation Age of Onset   Hypertension Other    Diabetes Mother        type 2   Hypertension Mother    Obesity Mother    Diabetes Brother        type 1   Renal Disease Brother        renal failure   Hypertension Brother    Diabetes Father    Hypertension Father    High Cholesterol Father    Sleep apnea Father    Obesity Father    Heart disease Father    Breast cancer Maternal Aunt 64   Colon cancer Maternal Aunt    Uterine cancer Maternal Aunt 65       same as breast cancer   Uterine cancer Maternal Aunt 61   Rectal cancer Neg Hx    Esophageal cancer Neg Hx     Past Surgical History:  Procedure Laterality Date   ANAL RECTAL MANOMETRY N/A 03/25/2018   Procedure: ANO RECTAL MANOMETRY;  Surgeon: Doran Stabler, MD;  Location: [facility];  Service: Gastroenterology;  Laterality: N/A;   CARPAL TUNNEL RELEASE Left 10/16/2019   Procedure: LEFT CARPAL TUNNEL RELEASE;  Surgeon: Carole Civil, MD;  Location: [facility];  Service: Orthopedics;  Laterality: Left;   CESAREAN SECTION     x 2   CHOLECYSTECTOMY     EYE SURGERY     REFRACTIVE SURGERY Bilateral    Social History   Occupational History   Occupation: Home maker  Tobacco Use   Smoking status: Never   Smokeless tobacco: Never  Vaping Use   Vaping Use: Never used  Substance and Sexual Activity   Alcohol use: No   Drug use: No   Sexual activity: Yes    Partners: Male    Birth control/protection: I.U.D.

## 2021-08-24 ENCOUNTER — Other Ambulatory Visit: Payer: Self-pay | Admitting: Psychiatry

## 2021-08-24 DIAGNOSIS — F3162 Bipolar disorder, current episode mixed, moderate: Secondary | ICD-10-CM

## 2021-08-31 ENCOUNTER — Ambulatory Visit (INDEPENDENT_AMBULATORY_CARE_PROVIDER_SITE_OTHER): Payer: Medicare Other | Admitting: Orthopedic Surgery

## 2021-08-31 ENCOUNTER — Encounter: Payer: Self-pay | Admitting: Orthopedic Surgery

## 2021-08-31 ENCOUNTER — Other Ambulatory Visit: Payer: Self-pay

## 2021-08-31 DIAGNOSIS — G5601 Carpal tunnel syndrome, right upper limb: Secondary | ICD-10-CM

## 2021-08-31 NOTE — Progress Notes (Signed)
Post-Op Visit Note   Patient: [name]           Date of Birth: 1985-12-19           MRN: [id number] Visit Date: 08/31/2021 PCP: Dede Query, PA-C   Assessment & Plan:  Chief Complaint:  Chief Complaint  Patient presents with   Right Wrist - Follow-up    07/25/21 right CTR   Visit Diagnoses:  1. Carpal tunnel syndrome, right upper limb     Plan: [name] is a 35 year old patient underwent right carpal tunnel release 07/25/2021.  Doing okay.  Having some occasional pain at the base of her palm.  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  She is on disability.  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  On exam she does have pretty reasonable grip strength and range of motion is nearly symmetric to the left-hand side.  I think would be good for her to wait a little bit longer approximately 2 weeks before starting this part-time job.  Scar desensitization encouraged and continue with activities of daily living.  Follow-Up Instructions: Return if symptoms worsen or fail to improve.   Orders:  No orders of the defined types were placed in this encounter.  No orders of the defined types were placed in this encounter.   Imaging: No results found.  PMFS History: Patient Active Problem List   Diagnosis Date Noted   Iron deficiency anemia 12/19/2020   Long term (current) use of insulin ([place]) 12/19/2020   Migraine 11/15/2020   CKD stage 3 secondary to diabetes ([place]) 11/15/2020   Diabetic neuropathy ([name]) 11/15/2020   Type 1 diabetes mellitus with stage 3b chronic kidney disease ([name]) 08/27/2020   Polyphagia 08/27/2020   Bipolar 1 disorder, mixed, severe ([place]) 08/27/2020   Type 1 diabetes mellitus with hyperglycemia ([name]) 08/27/2020   Financial difficulties 08/27/2020   Drug-induced weight gain 08/27/2020   Traction detachment of right retina 08/27/2020   Vitreous hemorrhage of left eye ([place]) 08/27/2020   Hypertension associated with  diabetes ([name]) 08/27/2020   Type 1 diabetes mellitus with diabetic polyneuropathy 08/27/2020   Class 1 obesity with serious comorbidity and body mass index (BMI) of 33.0 to 33.9 in adult 08/27/2020   Status post eye surgery 07/01/2020   Chronic migraine without aura 01/22/2020   S/P eye surgery 08/29/2019   Diplopia 07/23/2019   Epiretinal membrane (ERM) of left eye 07/23/2019   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/2019   Pseudophakia of left eye 11/04/2018   Chronic kidney disease, stage 3b ([name]) 01/01/2017   Diabetic nephropathy associated with type 1 diabetes mellitus ([name]) 03/01/2016   Drug overdose, intentional ([place]) 03/27/2014   Asthma 11/10/2013   MDD (major depressive disorder), recurrent episode, severe ([place]) 09/26/2013   Generalized anxiety disorder 09/26/2013   Aphakia, right eye 09/14/2012   Retinal detachment, tractional, left eye 06/11/2012   Eczema 06/07/2012   Diabetic retinopathy associated with type 1 diabetes mellitus ([name]) 04/30/2012   H/O insertion of insulin pump 04/30/2012   History of atrial tachycardia 08/08/2010   Gastroparesis due to DM ([place]) 08/08/2010   Past Medical History:  Diagnosis Date   Anemia    Anemia of chronic disease 06/07/2012   Formatting of this note might be different from the original. Hematology consultation 07-13-16 (see note)--> Anemia of chronic disease Required 3 transfusions during pregnancy  Last Assessment & Plan:  Formatting  of this note might be different from the original. She has now required 3 blood transfusions this pregnancy for her anemia of chronic disease. The most recent was one week ago when she bec   Anxiety    Asthma    Asthma 11/10/2013   Back pain    Bipolar 1 disorder (HCC)    Chest pain    Chronic renal failure syndrome, stage 3 (moderate) (HCC)    Colles' fracture of left radius 03/15/3418   Complication of anesthesia    Constipation    Depression    Depression     Diabetic gastroparesis (HCC)    Diabetic neuropathy, type I diabetes mellitus ([name])    Diabetic ulcer of right great toe ([place]) 08/27/2020   Edema, lower extremity    Gallbladder disease    Gastroparesis    GERD (gastroesophageal reflux disease)    Headache(784.0)    Hypertension    Joint pain    Kidney stones    Palpitations    Polyneuropathy in diabetes(357.2)    PONV (postoperative nausea and vomiting)    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[place])    S/P carpal tunnel release left 10/16/19 11/04/2019   Shortness of breath    Tachycardia    baseline tachycardia    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[name])     Family History  Problem Relation Age of Onset   Hypertension Other    Diabetes Mother        type 2   Hypertension Mother    Obesity Mother    Diabetes Brother        type 1   Renal Disease Brother        renal failure   Hypertension Brother    Diabetes Father    Hypertension Father    High Cholesterol Father    Sleep apnea Father    Obesity Father    Heart disease Father    Breast cancer Maternal Aunt 64   Colon cancer Maternal Aunt    Uterine cancer Maternal Aunt 65       same as breast cancer   Uterine cancer Maternal Aunt 61   Rectal cancer Neg Hx    Esophageal cancer Neg Hx     Past Surgical History:  Procedure Laterality Date   ANAL RECTAL MANOMETRY N/A 03/25/2018   Procedure: ANO RECTAL MANOMETRY;  Surgeon: Doran Stabler, MD;  Location: [facility];  Service: Gastroenterology;  Laterality: N/A;   CARPAL TUNNEL RELEASE Left 10/16/2019   Procedure: LEFT CARPAL TUNNEL RELEASE;  Surgeon: Carole Civil, MD;  Location: [facility];  Service: Orthopedics;  Laterality: Left;   CESAREAN SECTION     x 2   CHOLECYSTECTOMY     EYE SURGERY     REFRACTIVE SURGERY Bilateral    Social History   Occupational History   Occupation: Home maker  Tobacco Use   Smoking status: Never   Smokeless tobacco: Never  Vaping Use    Vaping Use: Never used  Substance and Sexual Activity   Alcohol use: No   Drug use: No   Sexual activity: Yes    Partners: Male    Birth control/protection: I.U.D.

## 2021-09-07 ENCOUNTER — Encounter (INDEPENDENT_AMBULATORY_CARE_PROVIDER_SITE_OTHER): Payer: Self-pay | Admitting: Family Medicine

## 2021-09-07 ENCOUNTER — Telehealth (INDEPENDENT_AMBULATORY_CARE_PROVIDER_SITE_OTHER): Payer: Medicare Other | Admitting: Family Medicine

## 2021-09-07 ENCOUNTER — Other Ambulatory Visit: Payer: Self-pay

## 2021-09-07 DIAGNOSIS — E1069 Type 1 diabetes mellitus with other specified complication: Secondary | ICD-10-CM

## 2021-09-07 DIAGNOSIS — F3289 Other specified depressive episodes: Secondary | ICD-10-CM

## 2021-09-07 DIAGNOSIS — Z6833 Body mass index (BMI) 33.0-33.9, adult: Secondary | ICD-10-CM | POA: Diagnosis not present

## 2021-09-07 DIAGNOSIS — R632 Polyphagia: Secondary | ICD-10-CM | POA: Diagnosis not present

## 2021-09-07 DIAGNOSIS — E669 Obesity, unspecified: Secondary | ICD-10-CM

## 2021-09-07 MED ORDER — PHENTERMINE HCL 37.5 MG PO TABS: 37.5000 mg | ORAL_TABLET | Freq: Every day | ORAL | 0 refills | Status: DC

## 2021-09-07 MED ORDER — OZEMPIC (0.25 OR 0.5 MG/DOSE) 2 MG/1.5ML ~~LOC~~ SOPN: 0.2500 mg | PEN_INJECTOR | SUBCUTANEOUS | 0 refills | Status: DC

## 2021-09-08 ENCOUNTER — Other Ambulatory Visit (INDEPENDENT_AMBULATORY_CARE_PROVIDER_SITE_OTHER): Payer: Self-pay

## 2021-09-08 ENCOUNTER — Encounter (INDEPENDENT_AMBULATORY_CARE_PROVIDER_SITE_OTHER): Payer: Self-pay

## 2021-09-08 NOTE — Progress Notes (Signed)
TeleHealth Visit:  Due to the COVID-19 pandemic, this visit was completed with telemedicine (audio/video) technology to reduce patient and provider exposure as well as to preserve personal protective equipment.   [name] has verbally consented to this TeleHealth visit. The patient is located at home, the provider is located at the [facility] office. The participants in this visit include the listed provider and patient. The visit was conducted today via MyChart video.  Chief Complaint: OBESITY [name] is here to discuss her progress with her obesity treatment plan along with follow-up of her obesity related diagnoses. [name] is on following a lower carbohydrate, vegetable and lean protein rich diet plan and states she is following her eating plan approximately 80% of the time. [name] states she is not exercising at this time.  Today's visit was #: 22 Starting weight: 199 lbs Starting date: 11/13/2019  Interim History: [name] says that her home scale reads 215-220 pounds.  She had right carpal tunnel release in 07/2021.  She had the flu a few weeks ago.  She says she had 2 family dinners for Thanksgiving.  BS labile.   She is frustrated wit weight gain and asks to go back on Ozempic as well as Phentermine. We discussed the medications and previous complications at length. The patient understands monitoring parameters and red flags. She did not have side effects at low doses. She will stop the medications at the first sign of complications. She also understands that healthy food choices and regular exercise are essential.   Assessment/Plan:   1. Type 1 diabetes mellitus with other specified complication (HCC) Diabetes Mellitus: Not at goal. Medication: Novolog, insulin pump. Issues reviewed: blood sugar goals, complications of diabetes mellitus, hypoglycemia prevention and treatment, exercise, and nutrition.  Plan:  Start Ozempic 0.25 mg subcutaneously weekly.  Continue  other medications as prescribed. The patient will continue to focus on protein-rich, low simple carbohydrate foods. We reviewed the importance of hydration, regular exercise for stress reduction, and restorative sleep.   Lab Results  Component Value Date   HGBA1C 9.2 03/17/2021   HGBA1C 9.8 (H) 07/29/2020   HGBA1C 9.5 (H) 04/15/2020   Lab Results  Component Value Date   LDLCALC 91 04/04/2021   CREATININE 1.25 (H) 04/04/2021   - Start Semaglutide,0.25 or 0.5MG /DOS, (OZEMPIC, 0.25 OR 0.5 MG/DOSE,) 2 MG/1.5ML SOPN; Inject 0.25 mg into the skin once a week.  Dispense: 1.5 mL; Refill: 0  2. Polyphagia Improving, but not optimized. Current treatment: phentermine 37.5 mg daily.    Plan: She will continue to focus on protein-rich, low simple carbohydrate foods. We reviewed the importance of hydration, regular exercise for stress reduction, and restorative sleep.  - Refill phentermine (ADIPEX-P) 37.5 MG tablet; Take 1 tablet (37.5 mg total) by mouth daily before breakfast.  Dispense: 30 tablet; Refill: 0. She will take 1/2 tab in the am.  I have consulted the [place] Controlled Substances Registry for this patient, and feel the risk/benefit ratio today is favorable for proceeding with this prescription for a controlled substance. The patient understands monitoring parameters and red flags.   3. Other depression, with emotional eating Improving. Medication: None.  Push fluids.  Plan: Discussed cues and consequences, how thoughts affect eating, model of thoughts, feelings, and behaviors, and strategies for change by focusing on the cue. Discussed cognitive distortions, coping thoughts, and how to change your thoughts.  4. Obesity, current BMI 33.7  [name] is currently in the action stage of change. As such, her goal  is to continue with weight loss efforts. She has agreed to following a lower carbohydrate, vegetable and lean protein rich diet plan.   Exercise goals: For substantial health benefits,  adults should do at least 150 minutes (2 hours and 30 minutes) a week of moderate-intensity, or 75 minutes (1 hour and 15 minutes) a week of vigorous-intensity aerobic physical activity, or an equivalent combination of moderate- and vigorous-intensity aerobic activity. Aerobic activity should be performed in episodes of at least 10 minutes, and preferably, it should be spread throughout the week.  Behavioral modification strategies: increasing lean protein intake, decreasing simple carbohydrates, increasing vegetables, increasing water intake, and decreasing liquid calories.  [name] has agreed to follow-up with our clinic in 2 weeks. She was informed of the importance of frequent follow-up visits to maximize her success with intensive lifestyle modifications for her multiple health conditions.  Objective:   VITALS: Per patient if applicable, see vitals. GENERAL: Alert and in no acute distress. CARDIOPULMONARY: No increased WOB. Speaking in clear sentences.  PSYCH: Pleasant and cooperative. Speech normal rate and rhythm. Affect is appropriate. Insight and judgement are appropriate. Attention is focused, linear, and appropriate.  NEURO: Oriented as arrived to appointment on time with no prompting.   Lab Results  Component Value Date   CREATININE 1.25 (H) 04/04/2021   BUN 18 04/04/2021   NA 137 04/04/2021   K 4.2 04/04/2021   CL 99 04/04/2021   CO2 21 04/04/2021   Lab Results  Component Value Date   ALT 16 04/04/2021   AST 18 04/04/2021   ALKPHOS 155 (H) 04/04/2021   BILITOT 0.4 04/04/2021   Lab Results  Component Value Date   HGBA1C 9.2 03/17/2021   HGBA1C 9.8 (H) 07/29/2020   HGBA1C 9.5 (H) 04/15/2020   HGBA1C 8.0 (H) 09/09/2019   HGBA1C 9.0 04/05/2017   Lab Results  Component Value Date   TSH 1.650 04/04/2021   Lab Results  Component Value Date   CHOL 200 (H) 04/04/2021   HDL 102 04/04/2021   LDLCALC 91 04/04/2021   TRIG 36 04/04/2021   CHOLHDL 2.0 04/04/2021   Lab  Results  Component Value Date   VD25OH 39.4 04/04/2021   VD25OH 32.0 06/30/2020   VD25OH 40.5 03/01/2020   Lab Results  Component Value Date   WBC 7.6 04/04/2021   HGB 15.2 04/04/2021   HCT 45.8 04/04/2021   MCV 87 04/04/2021   PLT 287 04/04/2021   Lab Results  Component Value Date   IRON 41 11/13/2019   TIBC 267 11/13/2019   FERRITIN 77 11/13/2019   Attestation Statements:   Reviewed by clinician on day of visit: allergies, medications, problem list, medical history, surgical history, family history, social history, and previous encounter notes.  I, Water quality scientist, CMA, am acting as transcriptionist for Briscoe Deutscher, DO  I have reviewed the above documentation for accuracy and completeness, and I agree with the above. - Briscoe Deutscher, DO, MS, FAAFP, DABOM - Family and Bariatric Medicine.

## 2021-09-14 ENCOUNTER — Other Ambulatory Visit (INDEPENDENT_AMBULATORY_CARE_PROVIDER_SITE_OTHER): Payer: Self-pay

## 2021-09-14 ENCOUNTER — Other Ambulatory Visit (INDEPENDENT_AMBULATORY_CARE_PROVIDER_SITE_OTHER): Payer: Self-pay | Admitting: Family Medicine

## 2021-09-14 DIAGNOSIS — E1069 Type 1 diabetes mellitus with other specified complication: Secondary | ICD-10-CM

## 2021-09-14 MED ORDER — TIRZEPATIDE 2.5 MG/0.5ML ~~LOC~~ SOAJ: 2.5000 mg | SUBCUTANEOUS | 0 refills | Status: DC

## 2021-09-15 ENCOUNTER — Other Ambulatory Visit: Payer: Self-pay

## 2021-09-15 ENCOUNTER — Encounter: Payer: Self-pay | Admitting: Psychiatry

## 2021-09-15 ENCOUNTER — Ambulatory Visit (INDEPENDENT_AMBULATORY_CARE_PROVIDER_SITE_OTHER): Payer: Medicare Other | Admitting: Psychiatry

## 2021-09-15 DIAGNOSIS — Z634 Disappearance and death of family member: Secondary | ICD-10-CM

## 2021-09-15 DIAGNOSIS — F411 Generalized anxiety disorder: Secondary | ICD-10-CM | POA: Diagnosis not present

## 2021-09-15 DIAGNOSIS — F3162 Bipolar disorder, current episode mixed, moderate: Secondary | ICD-10-CM

## 2021-09-15 DIAGNOSIS — F4322 Adjustment disorder with anxiety: Secondary | ICD-10-CM

## 2021-09-15 DIAGNOSIS — F4001 Agoraphobia with panic disorder: Secondary | ICD-10-CM

## 2021-09-15 DIAGNOSIS — F430 Acute stress reaction: Secondary | ICD-10-CM

## 2021-09-15 DIAGNOSIS — G2581 Restless legs syndrome: Secondary | ICD-10-CM

## 2021-09-15 DIAGNOSIS — Z9114 Patient's other noncompliance with medication regimen: Secondary | ICD-10-CM

## 2021-09-15 MED ORDER — CLONAZEPAM 0.5 MG PO TABS: 0.5000 mg | ORAL_TABLET | Freq: Three times a day (TID) | ORAL | 0 refills | Status: DC | PRN

## 2021-09-15 MED ORDER — CARIPRAZINE HCL 3 MG PO CAPS: 3.0000 mg | ORAL_CAPSULE | Freq: Every day | ORAL | 1 refills | Status: DC

## 2021-09-15 NOTE — Progress Notes (Signed)
[name] [id number] Sep 15, 1986 35 y.o.    Subjective:   Patient ID:  [name] is a 35 y.o. (DOB 07-Jul-1986) female.  Chief Complaint:  Chief Complaint  Patient presents with   Follow-up    Bipolar 1 disorder, mixed, moderate (HCC)    Depression        Associated symptoms include appetite change. [name] presents to the office today for follow-up of bipolar disorder and generalized anxiety disorder. Historically seen with [name].  When seen June 05, 2019.  The following changes were made: Change pramipexole ER and increase to 0.75 mg pm to rid nausea and improve mood in the afternoon.  For tiredness in the afternoon switch Equetro to 400 mg AM , 1@ 6 and 1 at 11. For panic in daytime start gabapentin 300 mg each am as preventative.  She's only taking it prn now. We continued unchanged Latuda 120 mg and lamotrigine 300 mg twice daily for bipolar disorder. She just got the ER pramipexole about a week ago.  Can't tell difference with nausea DT  gastroparesis worse lately with constipation Less afternoon tiredness with current split Equetro dose.    seen July 22, 2019.  Meds were not changed at that visit.  She called back to October 20 wanting to try valproate because of racing thoughts and difficulty sleeping.  Stating the trazodone was not working.  She was told it was okay to start Depakote ER 500 mg nightly and then gradually increased to 1500 mg nightly.  She called back again on October 6 and this physician noted the following: RTC  Pt called to report new med she started having a lot of side effects mood swings, sounds in ears, suicidal thoughts, tremors. Causing issues with husband She is currently taking Depakote ER 1500 mg nightly. She commits to safety.  The only symptom directly attributable to Depakote would be the tremor.  However its presence would indicate she is not going to be able to tolerate a higher dose of  Depakote to control her mood symptoms.  Therefore we will wean the Depakote.  In order to control the mood symptoms we will increase the Equetro at the evening dose which should not cause significant side effects problems during the day but help with mood stability.  This increase should also make it easier to wean the Depakote quickly.  She agrees to this plan.  She will call back if the suicidal thoughts become too intense to tolerate.  Reduce Depakote ER 500 mg tablets to 1 a night for 4 nights then stop Increase Equetro 200 mg capsules to 2 in the morning and 3 nightly.  [name], MD, DFAPA  She's been back on Equetro and doing OK. 2 weeks leading to Xmas with a lot of anger and lashing out a good bit.  Had run out of mirapex and Latuda for a couple of days in that time frame.  Crying is less now.  No SE CBZ now.  A lot of migraines off her Trokendi bc risk kidney stones.  Plans Botox for HA.  Usually sleeping OK without trazodone.   Still taking Latuda, buspirone, gabapentin, lamotrigine 300 mg BID.    seen 12.30.21 without med change.    12/17/19 appt with the following noted: Since then increased gabapentin for anxiety to 600 BID-TID which helped but caused some tiredness.  Needing trazodone more bc EMA/EFA.  She's stopped caffeine.  Mood stability is getting better despite a lot of  stress. $ stress and racing thoughts at night and scattered some with stressors.  Depression is mild lately.  Took trip with H last week helped.  Coming up on 2 year anniversary of B's death.  B diabetic died last year with DM and kidney failure. Not as much anger irritability.  Some mood swings early FEB but better now. Custody battle with exH.  He's nicer to her now.   Weight going up. Hates weight gain effects of meds.    Consistent with meds.  No sig SI other than fleeting rarely.  Scattered concentration and racing thoughts randomly.   Sleep OK. Plan: No med changes/ FU 4 mos  06/18/20 TC saying she  stopped all psych meds end of August.  06/22/20 appt with the following noted: Had gradually dropped off meds and not sure why.  Stoped Latuda, buspirone, CBZ. Pramipexole., and lamotrigine.  Sleep Ok without trazodone.  Gabapentin less tolerated as prn. Stopped Equetro over a month ago DT insurance.  Back on regular medicare and it might be covered. A lot of stress here lately. Lost father 01/01/20 of MI.  since here and called got propranolol and it hleped crying.  Also got some clonazepam.   Had given up on life and everything.  Not SI today but has been.  No plan.  Depression and mood swings including anger.  Anxiety. She and therapist say she's depressed and recommended IOP at [facility]. Agrees to retry Seroquel which helped in the past Plan: Acute decompensation because of psychiatric noncompliance. quetiapine ER 1 at night for 4 nights, then 2 at night for 4 nights, then 3 at night.  07/13/2020 appointment with the following noted: Patient called in between appointments complaining of quetiapine call causing restless legs.  Gabapentin was called in with instructions to make this side effect resolve. Misunderstood and only taking 300 mg Seroquel XR for a couple of days. Referred to [facility] and has continued therapy. Feels better on Seroquel.  No longer SI.  More stable.  Less anger.  Depression down to mild generally.  Fewer panic attacks. Sleeps with Seroquel 8 hours.  RLS is managed fairly well at this time. Initially too tired with Seroquel and better now. Clonazepam once in 2 weeks for panic driving. Referred to IOP and continued Seroquel ER 600 HS  08/10/20 appt with the following noted: Insurance wouldn't cover enough of IOP.  Still seeing therapist every 2 weeks. Consistent with Seroquel XR 600 daily and needs gabapentin with it DT RLS. No longer taking propranolol. Taking clonazepam only twice in last month. I feel pretty stable right now.  First Xmas without father.  Feels  better than mos ago.  More stable.  Sleep good.  Hangover if late with Seroquel.  Not unusually angry, under control.  Depression is better.  Occ down day.  Function is pretty good with routine things.   Energy is not great esp in the AM Gabapentin will make her feel too drunk if dose is too high. SE Seroquel makes her hungry. Plan: no med changes  09/09/2020 appointment with the following noted: Gabapentin manages the RLS. Compliant with Seroquel XR 600 mg HS. Kind of stressed out and sort of moody.  Mild mood swings with some on edge and agitated easily.  No SI. Kind of down over weight management visit and lack of progress.  No energy to want to do anything.  Sleep is fine with Seroquel.  In bed at least 8 hours daily. Used Klonopin twice  since last visit.  Triggered panic attack last week. Back on Reglan for 2 mos. Plan: Prescription given with schedule to increase to the target dose of Wellbutrin SR 100 mg tablets 2 twice daily and topiramate 25 mg tablets 2 twice daily both to help with weight management and energy and mild depression.  12/16/2020 phone call wanting to stop Seroquel.  02/10/2021 appointment with the following noted: Missed appt here.  She stopped Seroquel on her own gradually and completely about a month ago.  Was taking it off and on.  The gabapentin was interfering with her ability to function.  Wanting to lay down.  But had to take the gabapentin bc Seroquel giving her RLS.  Still very moody but not over the edge like she was in the past. Clonazepam once or twice weekly.  Stopped gabapentin bc no longer has RLS off the Seroquel. No trouble sleeping usually. Feels agitated but not manic.  A lot of stress at home. Lives in [place] beside in-laws with conflict over $ with them.  A lot of stress right now and hurt and anger with them over things. Not many friends to talk to and not a lot of conversation with H.  Feels like needs to restart therapy. Doing video therapy with  Christina Hussami. Wellbutrin seemed to help mental state some but didn't help weight much. Plan: Consider retrying Vraylar bc weight gain.  Yes Start 1.5 mg for 1 week then 3 mg daily.  04/14/2021 appointment with the following noted: Seems to be working good with Vraylar 1.5 mg and stopped Wellbutrin. No SE. She started phentermine from Dr. Juleen China with Topomax. No SE mania and no sig benefit at 1.2 tablet.  No SE. Sleep normal and not manic. Depression is better with Vraylar but down a couple of times weekly and still a lot of stress at home which affects mood. Mo in law is source of stress bc lives in camper on her property.  She hates me. Won't allow her to use washer.  She's the reason pt needs clonazepam. $ stress.  Ran out of food stamps.  All 5 kids under their care for the summer.  She cooks for M in [employer] but won't help with cost. Disc D's psych provblems Plan no med changes  09/15/2021 appt noted: Taking clonazepam 0.5 mg 3-4 times per week.    Rougher time last few mos. Still seeing therapist. Hand surgery October and got depressed and gained weight and stopped meds for a week or 2.  Couldn't do anything after the surgery for awhile.  Didn't feel family was helping.  Worry over $.  H not working much and she's on disability.  Still living in camper which is falling apart. Has talked to H about helping and he's looking for a job.  Stress is usual cause of her depression and anxiety. Occ panic attacks.  Some isolation lately. ON Vraylar 3 mg daily.  No SE. Meds covered well DT MCR and MCD. M says she can tell when pt misses meds bc gets more emotional and posts on Facebook change.  Remarried April 2020.  35 yo D ADD is stressful and disrespectful. Disability review approved mostly for psych reasons.  Going to [facility] weight management and just started vitamin D. Level 24.5.  Past Psychiatric Medication Trials:  lamotrigine 300 twice daily, Equetro 900 daily,  Depakote 1500 SE,   History topiramate for migraine Vraylar 3,  Abilify 15,  lithium felt slowed down,  Seroquel worked but  stopped DT insurance and SE RLS and hunger,  Latuda 120, sertraline, duloxetine,   gabapentin 600 mg 3 times daily tiredness,  buspirone 30 twice daily,,  clonazepam, trazodone, Xanax , hydroxyzine NR,  History of suicidal ideation and about 5 suicide attempts with about 4 psychiatric hospitalizations  Review of Systems:  Review of Systems  Constitutional:  Positive for appetite change and unexpected weight change.  Cardiovascular:  Negative for palpitations.  Gastrointestinal:  Negative for nausea and vomiting.  Musculoskeletal:  Positive for arthralgias.       Recent hand surgery healing  Neurological:  Negative for tremors and weakness.  Psychiatric/Behavioral:  Negative for sleep disturbance. The patient is nervous/anxious.    Medications: I have reviewed the patient's current medications.  Current Outpatient Medications  Medication Sig Dispense Refill   albuterol (PROVENTIL) (2.5 MG/3ML) 0.083% nebulizer solution Take 3 mLs (2.5 mg total) by nebulization every 6 (six) hours as needed for wheezing or shortness of breath. 150 mL 1   albuterol (VENTOLIN HFA) 108 (90 Base) MCG/ACT inhaler TAKE 2 PUFFS BY MOUTH EVERY 6 HOURS AS NEEDED FOR WHEEZE OR SHORTNESS OF BREATH 18 each 2   amLODipine (NORVASC) 2.5 MG tablet Take 2.5 mg by mouth daily.     aspirin 81 MG chewable tablet Chew by mouth.     budesonide-formoterol (SYMBICORT) 80-4.5 MCG/ACT inhaler Inhale 2 puffs into the lungs 2 (two) times daily. RINSE MOUTH WITH WATER AND SPIT AFTER EVERY USE 1 each 1   fluticasone (FLONASE) 50 MCG/ACT nasal spray Place 2 sprays into both nostrils daily. (Patient taking differently: Place 2 sprays into both nostrils daily as needed (seasonal allergies.).) 16 g 2   furosemide (LASIX) 40 MG tablet Take 40 mg by mouth every other day.      GLUCAGON EMERGENCY 1 MG injection Inject 1 mg into the  skin as needed (hypoglycemia.).      Insulin Human (INSULIN PUMP) SOLN Inject 1 each into the skin 3 times daily with meals, bedtime and 2 AM. Novolog: 36-38 units per day     Insulin Pen Needle 32G X 4 MM MISC 1 each by Does not apply route once a week. 50 each 0   levonorgestrel (MIRENA, 52 MG,) 20 MCG/24HR IUD 1 each by Intrauterine route once.     montelukast (SINGULAIR) 10 MG tablet Take 1 tablet (10 mg total) by mouth at bedtime. 90 tablet 1   NOVOLOG 100 UNIT/ML injection Inject 36-38 Units into the skin daily. Via insulin pump per sliding scale     phentermine (ADIPEX-P) 37.5 MG tablet Take 1 tablet (37.5 mg total) by mouth daily before breakfast. 30 tablet 0   pravastatin (PRAVACHOL) 20 MG tablet SMARTSIG:1 Tablet(s) By Mouth Every Evening     promethazine (PHENERGAN) 25 MG tablet Take 1 tablet (25 mg total) by mouth every 8 (eight) hours as needed for nausea or vomiting. 30 tablet 0   tirzepatide (MOUNJARO) 2.5 MG/0.5ML Pen Inject 2.5 mg into the skin once a week. 2 mL 0   tiZANidine (ZANAFLEX) 4 MG tablet Take 4 mg by mouth 2 (two) times daily as needed for muscle spasms.      Ubrogepant (UBRELVY) 100 MG TABS Take 100 mg by mouth daily as needed (migraine headaches.).      cariprazine (VRAYLAR) 3 MG capsule Take 1 capsule (3 mg total) by mouth daily. 90 capsule 1   clonazePAM (KLONOPIN) 0.5 MG tablet Take 1 tablet (0.5 mg total) by mouth every 8 (eight) hours as  needed. for anxiety 20 tablet 0   HYDROcodone-acetaminophen (NORCO/VICODIN) 5-325 MG tablet Take 1 tablet by mouth every 4 (four) hours as needed for moderate pain. (Patient not taking: Reported on 09/15/2021) 30 tablet 0   Semaglutide,0.25 or 0.5MG /DOS, (OZEMPIC, 0.25 OR 0.5 MG/DOSE,) 2 MG/1.5ML SOPN Inject 0.25 mg into the skin once a week. (Patient not taking: Reported on 09/15/2021) 1.5 mL 0   No current facility-administered medications for this visit.    Medication Side Effects: tired in the afternoon.  Allergies:   Allergies  Allergen Reactions   Nsaids Other (See Comments)    Due to Renal disease   Other    Toradol [Ketorolac Tromethamine]     Cannot take d/t kidney issues    Ceftin Rash    Past Medical History:  Diagnosis Date   Anemia    Anemia of chronic disease 06/07/2012   Formatting of this note might be different from the original. Hematology consultation 07-13-16 (see note)--> Anemia of chronic disease Required 3 transfusions during pregnancy  Last Assessment & Plan:  Formatting of this note might be different from the original. She has now required 3 blood transfusions this pregnancy for her anemia of chronic disease. The most recent was one week ago when she bec   Anxiety    Asthma    Asthma 11/10/2013   Back pain    Bipolar 1 disorder (HCC)    Chest pain    Chronic renal failure syndrome, stage 3 (moderate) (HCC)    Colles' fracture of left radius 3/0/8657   Complication of anesthesia    Constipation    Depression    Depression    Diabetic gastroparesis (HCC)    Diabetic neuropathy, type I diabetes mellitus ([place])    Diabetic ulcer of right great toe ([place]) 08/27/2020   Edema, lower extremity    Gallbladder disease    Gastroparesis    GERD (gastroesophageal reflux disease)    Headache(784.0)    Hypertension    Joint pain    Kidney stones    Palpitations    Polyneuropathy in diabetes(357.2)    PONV (postoperative nausea and vomiting)    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[place])    S/P carpal tunnel release left 10/16/19 11/04/2019   Shortness of breath    Tachycardia    baseline tachycardia    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[place])     Family History  Problem Relation Age of Onset   Hypertension Other    Diabetes Mother        type 2   Hypertension Mother    Obesity Mother    Diabetes Brother        type 1   Renal Disease Brother        renal failure   Hypertension Brother    Diabetes Father    Hypertension Father     High Cholesterol Father    Sleep apnea Father    Obesity Father    Heart disease Father    Breast cancer Maternal Aunt 64   Colon cancer Maternal Aunt    Uterine cancer Maternal Aunt 65       same as breast cancer   Uterine cancer Maternal Aunt 61   Rectal cancer Neg Hx    Esophageal cancer Neg Hx     Social History   Socioeconomic History   Marital status: Married    Spouse name: Not on file   Number of children: 2  Years of education: 67   Highest education level: 12th grade  Occupational History   Occupation: Materials engineer  Tobacco Use   Smoking status: Never   Smokeless tobacco: Never  Vaping Use   Vaping Use: Never used  Substance and Sexual Activity   Alcohol use: No   Drug use: No   Sexual activity: Yes    Partners: Male    Birth control/protection: I.U.D.  Other Topics Concern   Not on file  Social History Narrative   Pt has a daughter and boyfriend.    Dad passed away suddenly heart attack 12/2019   Social Determinants of Health   Financial Resource Strain: Not on file  Food Insecurity: Not on file  Transportation Needs: Not on file  Physical Activity: Not on file  Stress: Not on file  Social Connections: Not on file  Intimate Partner Violence: Not on file    Past Medical History, Surgical history, Social history, and Family history were reviewed and updated as appropriate.   Please see review of systems for further details on the patient's review from today.   Objective:   Physical Exam:  There were no vitals taken for this visit.  Physical Exam Constitutional:      General: She is not in acute distress.    Appearance: She is obese.  Musculoskeletal:        General: No deformity.  Neurological:     Mental Status: She is alert and oriented to person, place, and time.     Cranial Nerves: No dysarthria.     Coordination: Coordination normal.  Psychiatric:        Attention and Perception: Attention and perception normal. She does not perceive  auditory or visual hallucinations.        Mood and Affect: Mood is anxious and depressed. Affect is not labile, blunt, angry or inappropriate.        Speech: Speech normal.        Behavior: Behavior normal. Behavior is cooperative.        Thought Content: Thought content normal. Thought content is not paranoid or delusional. Thought content does not include homicidal or suicidal ideation. Thought content does not include homicidal or suicidal plan.        Cognition and Memory: Cognition and memory normal.        Judgment: Judgment normal.     Comments: Insight intact     Lab Review:     Component Value Date/Time   NA 137 04/04/2021 1250   K 4.2 04/04/2021 1250   CL 99 04/04/2021 1250   CO2 21 04/04/2021 1250   GLUCOSE 122 (H) 04/04/2021 1250   GLUCOSE 147 (H) 10/14/2019 0920   BUN 18 04/04/2021 1250   CREATININE 1.25 (H) 04/04/2021 1250   CREATININE 1.81 (H) 06/17/2018 1231   CALCIUM 9.6 04/04/2021 1250   PROT 7.1 04/04/2021 1250   ALBUMIN 4.5 04/04/2021 1250   AST 18 04/04/2021 1250   ALT 16 04/04/2021 1250   ALKPHOS 155 (H) 04/04/2021 1250   BILITOT 0.4 04/04/2021 1250   GFRNONAA 43 (L) 07/29/2020 1246   GFRNONAA 36 (L) 06/17/2018 1231   GFRAA 50 (L) 07/29/2020 1246   GFRAA 42 (L) 06/17/2018 1231       Component Value Date/Time   WBC 7.6 04/04/2021 1250   WBC 7.9 10/14/2019 0920   RBC 5.29 (H) 04/04/2021 1250   RBC 4.52 10/14/2019 0920   HGB 15.2 04/04/2021 1250   HCT 45.8 04/04/2021 1250  PLT 287 04/04/2021 1250   MCV 87 04/04/2021 1250   MCH 28.7 04/04/2021 1250   MCH 29.6 10/14/2019 0920   MCHC 33.2 04/04/2021 1250   MCHC 32.0 10/14/2019 0920   RDW 12.4 04/04/2021 1250   LYMPHSABS 2.4 04/04/2021 1250   MONOABS 0.8 10/14/2019 0920   EOSABS 0.3 04/04/2021 1250   BASOSABS 0.1 04/04/2021 1250    No results found for: POCLITH, LITHIUM   No results found for: PHENYTOIN, PHENOBARB, VALPROATE, CBMZ   .res Assessment: Plan:    Adam was seen today for  follow-up.  Diagnoses and all orders for this visit:  Bipolar 1 disorder, mixed, moderate (HCC) -     cariprazine (VRAYLAR) 3 MG capsule; Take 1 capsule (3 mg total) by mouth daily.  Generalized anxiety disorder  Panic disorder with agoraphobia -     clonazePAM (KLONOPIN) 0.5 MG tablet; Take 1 tablet (0.5 mg total) by mouth every 8 (eight) hours as needed. for anxiety  Restless leg syndrome, controlled  Bereavement  Noncompliance with medication regimen  Adjustment disorder with anxious mood  Acute stress reaction -     clonazePAM (KLONOPIN) 0.5 MG tablet; Take 1 tablet (0.5 mg total) by mouth every 8 (eight) hours as needed. for anxiety   Greater than 50% of 30 min non face to face time with patient was spent on counseling and coordination of care. We discussed Patient with a history of multiple med failures and history of psychiatric hospitalization and suicide attempts recent worsening of bipolar depression.  Chronic stressors contribute to mood and anxiety problems.   Concern about potential weight gain with medications.  She has taken the major mood stabilizers with low weight gain potential. Discussed the pros and cons of switching mood stabilizers.  Could consider ultra low-dose lithium.   She recently failed to tolerate VPA.    Ok off Wellbutrin Disc risk Phentermine causing mania.  She understands.  Vraylar bc weight gain.  Continue 3 mg daily.  Discussed potential metabolic side effects associated with atypical antipsychotics, as well as potential risk for movement side effects. Advised pt to contact office if movement side effects occur.    RLS Not a problem now.  Follow-up 2 months  [name], MD, DFAPA   Please see After Visit Summary for patient specific instructions.  Future Appointments  Date Time Provider [place]  09/22/2021 10:00 AM Hussami, [name], LCSW [facility] None  09/22/2021 12:20 PM Briscoe Deutscher, DO [facility] None    No orders  of the defined types were placed in this encounter.   -------------------------------

## 2021-09-18 DIAGNOSIS — R0602 Shortness of breath: Secondary | ICD-10-CM | POA: Diagnosis not present

## 2021-09-18 DIAGNOSIS — R0789 Other chest pain: Secondary | ICD-10-CM | POA: Diagnosis not present

## 2021-09-22 ENCOUNTER — Ambulatory Visit: Payer: Medicare Other | Admitting: Licensed Clinical Social Worker

## 2021-09-22 ENCOUNTER — Ambulatory Visit (INDEPENDENT_AMBULATORY_CARE_PROVIDER_SITE_OTHER): Payer: Medicare Other | Admitting: Family Medicine

## 2021-09-22 ENCOUNTER — Encounter (INDEPENDENT_AMBULATORY_CARE_PROVIDER_SITE_OTHER): Payer: Self-pay | Admitting: Family Medicine

## 2021-09-22 ENCOUNTER — Other Ambulatory Visit: Payer: Self-pay

## 2021-09-22 ENCOUNTER — Telehealth: Payer: Self-pay | Admitting: Licensed Clinical Social Worker

## 2021-09-22 VITALS — BP 120/87 | HR 118 | Temp 98.0°F | Ht 66.0 in | Wt 215.0 lb

## 2021-09-22 DIAGNOSIS — Z599 Problem related to housing and economic circumstances, unspecified: Secondary | ICD-10-CM

## 2021-09-22 DIAGNOSIS — Z6833 Body mass index (BMI) 33.0-33.9, adult: Secondary | ICD-10-CM

## 2021-09-22 DIAGNOSIS — F3289 Other specified depressive episodes: Secondary | ICD-10-CM

## 2021-09-22 DIAGNOSIS — E1069 Type 1 diabetes mellitus with other specified complication: Secondary | ICD-10-CM

## 2021-09-22 DIAGNOSIS — E669 Obesity, unspecified: Secondary | ICD-10-CM

## 2021-09-22 NOTE — Telephone Encounter (Signed)
Patient left VM 12/14 after hours to have appt canceled

## 2021-09-26 NOTE — Progress Notes (Signed)
Chief Complaint:   OBESITY [name] is here to discuss her progress with her obesity treatment plan along with follow-up of her obesity related diagnoses. See Medical Weight Management Flowsheet for complete bioelectrical impedance results.  Today's visit was #: 23 Starting weight: 199 lbs Starting date: 11/13/2019 Weight change since last visit: +6 lbs Total lbs lost to date: +16 lbs  Nutrition Plan: Lower carbohydrate, vegetable and lean protein rich diet plan for 50% of the time.  Activity: None. Anti-obesity medications: Ozempic 0.25 mg subcutaneously weekly and phentermine 18 mg daily. Reported side effects: None.  Interim History: [name] has upcoming visits with her PCP, Cardiology, and Endocrinology.    Assessment/Plan:   1. Type 1 diabetes mellitus with other specified complication (HCC) Diabetes Mellitus: Not at goal. Medication: Novolog insulin pump, Ozempc 0.25 mg subcutaneously weekly. Issues reviewed: blood sugar goals, complications of diabetes mellitus, hypoglycemia prevention and treatment, exercise, and nutrition.  Plan:  Increase Ozempic to 0.5 mg subcutaneously weekly. The patient will continue to focus on protein-rich, low simple carbohydrate foods. We reviewed the importance of hydration, regular exercise for stress reduction, and restorative sleep.   Lab Results  Component Value Date   HGBA1C 9.2 03/17/2021   HGBA1C 9.8 (H) 07/29/2020   HGBA1C 9.5 (H) 04/15/2020   Lab Results  Component Value Date   LDLCALC 91 04/04/2021   CREATININE 1.25 (H) 04/04/2021   2. Financial difficulties [name] has difficulty affording healthy foods. We discussed low cost meals and will look into more options.  3. Other depression, with emotional eating Improving. Medication: phentermine 37.5 mg daily. Taking 1/2 tab daily. She denies CP, SOB, edema, HA.   Plan: Discussed cues and consequences, how thoughts affect eating, model of thoughts, feelings, and behaviors, and  strategies for change by focusing on the cue. Discussed cognitive distortions, coping thoughts, and how to change your thoughts.  4. Obesity, current BMI 34.7  Course: [name] is currently in the action stage of change. As such, her goal is to continue with weight loss efforts.   Nutrition goals: She has agreed to following a lower carbohydrate, vegetable and lean protein rich diet plan.   Exercise goals: For substantial health benefits, adults should do at least 150 minutes (2 hours and 30 minutes) a week of moderate-intensity, or 75 minutes (1 hour and 15 minutes) a week of vigorous-intensity aerobic physical activity, or an equivalent combination of moderate- and vigorous-intensity aerobic activity. Aerobic activity should be performed in episodes of at least 10 minutes, and preferably, it should be spread throughout the week.  Behavioral modification strategies: increasing lean protein intake, decreasing simple carbohydrates, increasing vegetables, increasing water intake, and decreasing liquid calories.  [name] has agreed to follow-up with our clinic in 4 weeks. She was informed of the importance of frequent follow-up visits to maximize her success with intensive lifestyle modifications for her multiple health conditions.   Objective:   Blood pressure 120/87, pulse (!) 118, temperature 98 F (36.7 C), temperature source Oral, height 5\' 6"  (1.676 m), weight 215 lb (97.5 kg), SpO2 98 %. Body mass index is 34.7 kg/m.  General: Cooperative, alert, well developed, in no acute distress. HEENT: Conjunctivae and lids unremarkable. Cardiovascular: Regular rhythm.  Lungs: Normal work of breathing. Neurologic: No focal deficits.   Lab Results  Component Value Date   CREATININE 1.25 (H) 04/04/2021   BUN 18 04/04/2021   NA 137 04/04/2021   K 4.2 04/04/2021   CL 99 04/04/2021   CO2 21 04/04/2021  Lab Results  Component Value Date   ALT 16 04/04/2021   AST 18 04/04/2021   ALKPHOS 155  (H) 04/04/2021   BILITOT 0.4 04/04/2021   Lab Results  Component Value Date   HGBA1C 9.2 03/17/2021   HGBA1C 9.8 (H) 07/29/2020   HGBA1C 9.5 (H) 04/15/2020   HGBA1C 8.0 (H) 09/09/2019   HGBA1C 9.0 04/05/2017   Lab Results  Component Value Date   TSH 1.650 04/04/2021   Lab Results  Component Value Date   CHOL 200 (H) 04/04/2021   HDL 102 04/04/2021   LDLCALC 91 04/04/2021   TRIG 36 04/04/2021   CHOLHDL 2.0 04/04/2021   Lab Results  Component Value Date   VD25OH 39.4 04/04/2021   VD25OH 32.0 06/30/2020   VD25OH 40.5 03/01/2020   Lab Results  Component Value Date   WBC 7.6 04/04/2021   HGB 15.2 04/04/2021   HCT 45.8 04/04/2021   MCV 87 04/04/2021   PLT 287 04/04/2021   Lab Results  Component Value Date   IRON 41 11/13/2019   TIBC 267 11/13/2019   FERRITIN 77 11/13/2019   Attestation Statements:   Reviewed by clinician on day of visit: allergies, medications, problem list, medical history, surgical history, family history, social history, and previous encounter notes.  Time spent on visit including pre-visit chart review and post-visit care and documentation was 44 minutes.Time was spent on: Food choices and timing of food intake reviewed today. I discussed a personalized meal plan with the patient that will help her to lose weight and will improve her obesity-related conditions going forward. I performed a medically necessary appropriate examination and/or evaluation. I discussed the assessment and treatment plan with the patient. Motivational interviewing as well as evidence-based interventions for health behavior change were utilized today including the discussion of self monitoring techniques, problem-solving barriers and SMART goal setting techniques.  An exercise prescription was reviewed.  The patient was provided an opportunity to ask questions and all were answered. The patient agreed with the plan and demonstrated an understanding of the instructions. Clinical  information was updated and documented in the EMR.  I, Water quality scientist, CMA, am acting as transcriptionist for Briscoe Deutscher, DO  I have reviewed the above documentation for accuracy and completeness, and I agree with the above. -  Briscoe Deutscher, DO, MS, FAAFP, DABOM - Family and Bariatric Medicine.

## 2021-10-05 DIAGNOSIS — E1021 Type 1 diabetes mellitus with diabetic nephropathy: Secondary | ICD-10-CM | POA: Diagnosis not present

## 2021-10-05 DIAGNOSIS — J4541 Moderate persistent asthma with (acute) exacerbation: Secondary | ICD-10-CM | POA: Diagnosis not present

## 2021-10-05 DIAGNOSIS — J069 Acute upper respiratory infection, unspecified: Secondary | ICD-10-CM | POA: Diagnosis not present

## 2021-10-11 DIAGNOSIS — J4520 Mild intermittent asthma, uncomplicated: Secondary | ICD-10-CM | POA: Diagnosis not present

## 2021-10-11 DIAGNOSIS — R609 Edema, unspecified: Secondary | ICD-10-CM | POA: Diagnosis not present

## 2021-10-11 DIAGNOSIS — I10 Essential (primary) hypertension: Secondary | ICD-10-CM | POA: Diagnosis not present

## 2021-10-11 DIAGNOSIS — R079 Chest pain, unspecified: Secondary | ICD-10-CM | POA: Diagnosis not present

## 2021-10-12 ENCOUNTER — Other Ambulatory Visit (INDEPENDENT_AMBULATORY_CARE_PROVIDER_SITE_OTHER): Payer: Self-pay | Admitting: Family Medicine

## 2021-10-12 DIAGNOSIS — E1069 Type 1 diabetes mellitus with other specified complication: Secondary | ICD-10-CM

## 2021-10-12 DIAGNOSIS — R0789 Other chest pain: Secondary | ICD-10-CM | POA: Diagnosis not present

## 2021-10-12 DIAGNOSIS — N183 Chronic kidney disease, stage 3 unspecified: Secondary | ICD-10-CM | POA: Diagnosis not present

## 2021-10-12 DIAGNOSIS — E1122 Type 2 diabetes mellitus with diabetic chronic kidney disease: Secondary | ICD-10-CM | POA: Diagnosis not present

## 2021-10-12 DIAGNOSIS — Z09 Encounter for follow-up examination after completed treatment for conditions other than malignant neoplasm: Secondary | ICD-10-CM | POA: Diagnosis not present

## 2021-10-12 DIAGNOSIS — F3177 Bipolar disorder, in partial remission, most recent episode mixed: Secondary | ICD-10-CM | POA: Diagnosis not present

## 2021-10-12 DIAGNOSIS — E1021 Type 1 diabetes mellitus with diabetic nephropathy: Secondary | ICD-10-CM | POA: Diagnosis not present

## 2021-10-13 DIAGNOSIS — R079 Chest pain, unspecified: Secondary | ICD-10-CM | POA: Diagnosis not present

## 2021-10-13 DIAGNOSIS — R609 Edema, unspecified: Secondary | ICD-10-CM | POA: Diagnosis not present

## 2021-10-20 ENCOUNTER — Ambulatory Visit (INDEPENDENT_AMBULATORY_CARE_PROVIDER_SITE_OTHER): Payer: Medicare Other | Admitting: Family Medicine

## 2021-10-20 ENCOUNTER — Encounter (INDEPENDENT_AMBULATORY_CARE_PROVIDER_SITE_OTHER): Payer: Self-pay | Admitting: Family Medicine

## 2021-10-20 ENCOUNTER — Other Ambulatory Visit: Payer: Self-pay

## 2021-10-20 VITALS — BP 114/75 | HR 98 | Temp 98.0°F | Ht 66.0 in | Wt 212.0 lb

## 2021-10-20 DIAGNOSIS — Z6834 Body mass index (BMI) 34.0-34.9, adult: Secondary | ICD-10-CM

## 2021-10-20 DIAGNOSIS — R632 Polyphagia: Secondary | ICD-10-CM | POA: Diagnosis not present

## 2021-10-20 DIAGNOSIS — E669 Obesity, unspecified: Secondary | ICD-10-CM | POA: Diagnosis not present

## 2021-10-20 DIAGNOSIS — F439 Reaction to severe stress, unspecified: Secondary | ICD-10-CM | POA: Diagnosis not present

## 2021-10-20 DIAGNOSIS — E1069 Type 1 diabetes mellitus with other specified complication: Secondary | ICD-10-CM | POA: Diagnosis not present

## 2021-10-24 MED ORDER — PHENTERMINE HCL 37.5 MG PO TABS: 37.5000 mg | ORAL_TABLET | Freq: Every day | ORAL | 0 refills | Status: DC

## 2021-10-24 NOTE — Progress Notes (Signed)
Chief Complaint:   OBESITY [name] is here to discuss her progress with her obesity treatment plan along with follow-up of her obesity related diagnoses. See Medical Weight Management Flowsheet for complete bioelectrical impedance results.  Today's visit was #: 24 Starting weight: 199 lbs Starting date: 11/13/2019 Weight change since last visit: 3 lbs Total lbs lost to date: +13 lbs  Nutrition Plan: Lower carbohydrate, vegetable and lean protein rich diet plan for 75% of the time. Activity: Walking for 60 minutes 3 times per week.  Anti-obesity medications: Ozempic 0.5 mg subcutaneously weekly and phentermine 37.5 mg daily. Reported side effects: None.  Interim History: [name] is down 8 pounds since being in the hospital.  She says that her schedule is more regular now.  She recently had an echo.  Assessment/Plan:   1. Type 1 diabetes mellitus with other specified complication (HCC) Diabetes Mellitus: Not at goal. Medication: Novolog, insulin pump, Ozempic 0.5 mg subcutaneously weekly. Issues reviewed: blood sugar goals, complications of diabetes mellitus, hypoglycemia prevention and treatment, exercise, and nutrition.  Plan:  Continue Ozempic 0.5 mg subcutaneously weekly. The patient will continue to focus on protein-rich, low simple carbohydrate foods. We reviewed the importance of hydration, regular exercise for stress reduction, and restorative sleep.   Lab Results  Component Value Date   HGBA1C 9.2 03/17/2021   HGBA1C 9.8 (H) 07/29/2020   HGBA1C 9.5 (H) 04/15/2020   Lab Results  Component Value Date   LDLCALC 91 04/04/2021   CREATININE 1.25 (H) 04/04/2021   2. Polyphagia Controlled. Current treatment: phentermine 37.5 mg daily.    Plan:  Continue phentermine 37.5 mg daily.  Will refill today.  She will continue to focus on protein-rich, low simple carbohydrate foods. We reviewed the importance of hydration, regular exercise for stress reduction, and restorative  sleep.  Having again reminded the patient of the "off label" use of Phentermine beyond three consecutive months, and again discussing the risks, benefits, contraindications, and limitations of it's use; given it's role in the successful treatment of obesity thus far and lack of adverse effect, patient has expressed desire and given informed verbal consent to continue use.   I have consulted the [place] Controlled Substances Registry for this patient, and feel the risk/benefit ratio today is favorable for proceeding with this prescription for a controlled substance. The patient understands monitoring parameters and red flags.   3. Situational stress Behavior modification techniques were discussed today to help deal with emotional/non-hunger eating behaviors. Will continue to follow along as it relates to her weight loss journey.  4. Obesity, current BMI 34.3  Course: [name] is currently in the action stage of change. As such, her goal is to continue with weight loss efforts.   Nutrition goals: She has agreed to following a lower carbohydrate, vegetable and lean protein rich diet plan.   Exercise goals:  As is.  Behavioral modification strategies: increasing lean protein intake, decreasing simple carbohydrates, increasing vegetables, increasing water intake, and emotional eating strategies.  [name] has agreed to follow-up with our clinic in 4 weeks. She was informed of the importance of frequent follow-up visits to maximize her success with intensive lifestyle modifications for her multiple health conditions.   Objective:   Blood pressure 114/75, pulse 98, temperature 98 F (36.7 C), temperature source Oral, height 5\' 6"  (1.676 m), weight 212 lb (96.2 kg), SpO2 97 %. Body mass index is 34.22 kg/m.  General: Cooperative, alert, well developed, in no acute distress. HEENT: Conjunctivae and lids unremarkable. Cardiovascular:  Regular rhythm.  Lungs: Normal work of breathing. Neurologic: No  focal deficits.   Lab Results  Component Value Date   CREATININE 1.25 (H) 04/04/2021   BUN 18 04/04/2021   NA 137 04/04/2021   K 4.2 04/04/2021   CL 99 04/04/2021   CO2 21 04/04/2021   Lab Results  Component Value Date   ALT 16 04/04/2021   AST 18 04/04/2021   ALKPHOS 155 (H) 04/04/2021   BILITOT 0.4 04/04/2021   Lab Results  Component Value Date   HGBA1C 9.2 03/17/2021   HGBA1C 9.8 (H) 07/29/2020   HGBA1C 9.5 (H) 04/15/2020   HGBA1C 8.0 (H) 09/09/2019   HGBA1C 9.0 04/05/2017   Lab Results  Component Value Date   TSH 1.650 04/04/2021   Lab Results  Component Value Date   CHOL 200 (H) 04/04/2021   HDL 102 04/04/2021   LDLCALC 91 04/04/2021   TRIG 36 04/04/2021   CHOLHDL 2.0 04/04/2021   Lab Results  Component Value Date   VD25OH 39.4 04/04/2021   VD25OH 32.0 06/30/2020   VD25OH 40.5 03/01/2020   Lab Results  Component Value Date   WBC 7.6 04/04/2021   HGB 15.2 04/04/2021   HCT 45.8 04/04/2021   MCV 87 04/04/2021   PLT 287 04/04/2021   Lab Results  Component Value Date   IRON 41 11/13/2019   TIBC 267 11/13/2019   FERRITIN 77 11/13/2019   Attestation Statements:   Reviewed by clinician on day of visit: allergies, medications, problem list, medical history, surgical history, family history, social history, and previous encounter notes.  I, Water quality scientist, CMA, am acting as transcriptionist for Briscoe Deutscher, DO  I have reviewed the above documentation for accuracy and completeness, and I agree with the above. -  Briscoe Deutscher, DO, MS, FAAFP, DABOM - Family and Bariatric Medicine.

## 2021-10-28 ENCOUNTER — Other Ambulatory Visit (INDEPENDENT_AMBULATORY_CARE_PROVIDER_SITE_OTHER): Payer: Self-pay | Admitting: Family Medicine

## 2021-10-28 DIAGNOSIS — E1069 Type 1 diabetes mellitus with other specified complication: Secondary | ICD-10-CM

## 2021-10-31 NOTE — Telephone Encounter (Signed)
Dr.Wallace °

## 2021-11-01 ENCOUNTER — Ambulatory Visit: Payer: Medicare Other | Admitting: Licensed Clinical Social Worker

## 2021-11-01 ENCOUNTER — Other Ambulatory Visit: Payer: Self-pay

## 2021-11-10 ENCOUNTER — Ambulatory Visit: Payer: Medicaid Other | Admitting: Licensed Clinical Social Worker

## 2021-11-14 DIAGNOSIS — Z794 Long term (current) use of insulin: Secondary | ICD-10-CM | POA: Diagnosis not present

## 2021-11-14 DIAGNOSIS — N1832 Chronic kidney disease, stage 3b: Secondary | ICD-10-CM | POA: Diagnosis not present

## 2021-11-14 DIAGNOSIS — E1065 Type 1 diabetes mellitus with hyperglycemia: Secondary | ICD-10-CM | POA: Diagnosis not present

## 2021-11-14 DIAGNOSIS — E1040 Type 1 diabetes mellitus with diabetic neuropathy, unspecified: Secondary | ICD-10-CM | POA: Diagnosis not present

## 2021-11-14 DIAGNOSIS — E1022 Type 1 diabetes mellitus with diabetic chronic kidney disease: Secondary | ICD-10-CM | POA: Diagnosis not present

## 2021-11-14 DIAGNOSIS — I10 Essential (primary) hypertension: Secondary | ICD-10-CM | POA: Diagnosis not present

## 2021-11-23 ENCOUNTER — Ambulatory Visit (INDEPENDENT_AMBULATORY_CARE_PROVIDER_SITE_OTHER): Payer: Medicare Other | Admitting: Family Medicine

## 2021-11-23 ENCOUNTER — Encounter (INDEPENDENT_AMBULATORY_CARE_PROVIDER_SITE_OTHER): Payer: Self-pay | Admitting: Family Medicine

## 2021-11-23 NOTE — Telephone Encounter (Signed)
Dr.Wallace °

## 2021-11-29 DIAGNOSIS — H35373 Puckering of macula, bilateral: Secondary | ICD-10-CM | POA: Diagnosis not present

## 2021-11-29 DIAGNOSIS — E103523 Type 1 diabetes mellitus with proliferative diabetic retinopathy with traction retinal detachment involving the macula, bilateral: Secondary | ICD-10-CM | POA: Diagnosis not present

## 2021-11-29 DIAGNOSIS — E103513 Type 1 diabetes mellitus with proliferative diabetic retinopathy with macular edema, bilateral: Secondary | ICD-10-CM | POA: Diagnosis not present

## 2021-11-29 DIAGNOSIS — H2701 Aphakia, right eye: Secondary | ICD-10-CM | POA: Diagnosis not present

## 2021-11-29 DIAGNOSIS — H4312 Vitreous hemorrhage, left eye: Secondary | ICD-10-CM | POA: Diagnosis not present

## 2021-11-29 DIAGNOSIS — Z79899 Other long term (current) drug therapy: Secondary | ICD-10-CM | POA: Diagnosis not present

## 2021-11-29 DIAGNOSIS — H5010 Unspecified exotropia: Secondary | ICD-10-CM | POA: Diagnosis not present

## 2021-11-30 ENCOUNTER — Ambulatory Visit (INDEPENDENT_AMBULATORY_CARE_PROVIDER_SITE_OTHER): Payer: Medicare Other | Admitting: Family Medicine

## 2021-12-01 DIAGNOSIS — J020 Streptococcal pharyngitis: Secondary | ICD-10-CM | POA: Diagnosis not present

## 2021-12-05 DIAGNOSIS — N1832 Chronic kidney disease, stage 3b: Secondary | ICD-10-CM | POA: Diagnosis not present

## 2021-12-05 DIAGNOSIS — E1021 Type 1 diabetes mellitus with diabetic nephropathy: Secondary | ICD-10-CM | POA: Diagnosis not present

## 2021-12-05 DIAGNOSIS — Z6834 Body mass index (BMI) 34.0-34.9, adult: Secondary | ICD-10-CM | POA: Diagnosis not present

## 2021-12-05 DIAGNOSIS — I10 Essential (primary) hypertension: Secondary | ICD-10-CM | POA: Diagnosis not present

## 2021-12-22 ENCOUNTER — Ambulatory Visit (INDEPENDENT_AMBULATORY_CARE_PROVIDER_SITE_OTHER): Payer: Medicare Other | Admitting: Licensed Clinical Social Worker

## 2021-12-22 ENCOUNTER — Other Ambulatory Visit: Payer: Self-pay

## 2021-12-22 DIAGNOSIS — Z634 Disappearance and death of family member: Secondary | ICD-10-CM | POA: Diagnosis not present

## 2021-12-22 DIAGNOSIS — F3162 Bipolar disorder, current episode mixed, moderate: Secondary | ICD-10-CM | POA: Diagnosis not present

## 2021-12-22 NOTE — Progress Notes (Signed)
Virtual Visit via Video Note ? ?I connected with [name] on 12/22/21 at  3:00 PM EDT by a video enabled telemedicine application and verified that I am speaking with the correct person using two identifiers. ? ?Location: ?Patient: home ?Provider: [facility] ?  ?I discussed the limitations of evaluation and management by telemedicine and the availability of in person appointments. The patient expressed understanding and agreed to proceed. ?ment and treatment plan with the patient. The patient was provided an opportunity to ask questions and all were answered. The patient agreed with the plan and demonstrated an understanding of the instructions. ?  ?The patient was advised to call back or seek an in-person evaluation if the symptoms worsen or if the condition fails to improve as anticipated. ? ?I provided 45 minutes of non-face-to-face time during this encounter. ? ? ?Cristina Slatten R Lizandro Spellman, LCSW ? ? ?THERAPIST PROGRESS NOTE ? ?Session Time: 2-353I ? ?Participation Level: Active ? ?Behavioral Response: NeatAlertAnxious and Depressed ? ?Type of Therapy: Individual Therapy ? ?Treatment Goals addressed: Problem: Alleviate depressive/manic symptoms and return to improved levels of effective functioning. ? ?Goal: LTG: Stabilize mood and increase goal-directed behavior: Input needed on appropriate metric ?Outcome: Progressing ? ?Goal: STG: '@PREFFIRSTNAME'$ @ will attend at least 80% of scheduled follow-up counseling appointments ?Outcome: Progressing ? ?ProgressTowards Goals: Progressing ? ?Interventions: CBT and Other: bereavement counseling ? ?Intervention: Encourage verbalization of feelings/concerns/expectations ? ?Intervention: Encourage self-care activities ? ?Intervention: Encourage family support ? ?Intervention: Manage signs of labile or escalating emotions ? ?Intervention: bereavement counseling  ? ?Summary: [name] is a 36 y.o. female who presents with improving symptoms related to bipolar  disorder. Pt reports that overall mood has been stable and that she is managing stress and anxiety well. ? ?Allowed pt to explore and express thoughts and feelings associated with recent life situations and external stressors. Pt reports that she is continuing to have stress about current living situation--pt is still living in a camper on the property of MIL and her husband. Pt and girls living in the camper, husband, his parents, and pts stepson living in the house. Pt states husband just started a new job and she is excited about that.  ? ?Pt had eye surgery recently for detached retinas--discussed recovery process for pt. Pt states she has had issues with eyes for a long time. Pt states that she had blood pooling in her eye and had a hard time seeing for a while.  ? ?Discussed wellness--pt is trying hard to make positive choices with overall wellness.  ? ?Continued recommendations are as follows: self care behaviors, positive social engagements, focusing on overall work/home/life balance, and focusing on positive physical and emotional wellness.  ? ? ?Suicidal/Homicidal: No ? ?Therapist Response: [name] is continuing to work hard to use coping skills to manage her symptoms. Pt is still working hard to focus on overall wellness and weight loss. Pt feels well supported by her medical and psychiatric physicians. Pt is able to identify triggers and able to sustain gains/progress.  ? ?Plan: Return again in 4 weeks. ? ?Diagnosis: Bipolar 1 disorder, mixed, moderate ([place]) ? ?Bereavement ? ?Collaboration of Care: Other pt encouraged to continue care with psychiatrist of record, Dr. Clovis Pu ? ?Patient/Guardian was advised Release of Information must be obtained prior to any record release in order to collaborate their care with an outside provider. Patient/Guardian was advised if they have not already done so to contact the registration department to sign all necessary forms in order for [name] to  release information  regarding their care.  ? ?Consent: Patient/Guardian gives verbal consent for treatment and assignment of benefits for services provided during this visit. Patient/Guardian expressed understanding and agreed to proceed.  ? ?Cristina Wecker R Araminta Zorn, LCSW ?12/22/2021 ? ?

## 2021-12-23 NOTE — Plan of Care (Signed)
?  Problem: Alleviate depressive/manic symptoms and return to improved levels of effective functioning. ?Goal: LTG: Stabilize mood and increase goal-directed behavior: Input needed on appropriate metric ?Outcome: Progressing ?Goal: STG: '@PREFFIRSTNAME'$ @ will attend at least 80% of scheduled follow-up counseling appointments ?Outcome: Progressing ?Intervention: Encourage verbalization of feelings/concerns/expectations ?Intervention: Encourage self-care activities ?Intervention: Encourage family support ?Intervention: Manage signs of labile or escalating emotions ?  ?

## 2022-01-02 DIAGNOSIS — Z20818 Contact with and (suspected) exposure to other bacterial communicable diseases: Secondary | ICD-10-CM | POA: Diagnosis not present

## 2022-01-02 DIAGNOSIS — J069 Acute upper respiratory infection, unspecified: Secondary | ICD-10-CM | POA: Diagnosis not present

## 2022-01-02 DIAGNOSIS — R0981 Nasal congestion: Secondary | ICD-10-CM | POA: Diagnosis not present

## 2022-01-02 DIAGNOSIS — J4540 Moderate persistent asthma, uncomplicated: Secondary | ICD-10-CM | POA: Diagnosis not present

## 2022-01-02 DIAGNOSIS — J4541 Moderate persistent asthma with (acute) exacerbation: Secondary | ICD-10-CM | POA: Diagnosis not present

## 2022-01-05 ENCOUNTER — Other Ambulatory Visit (INDEPENDENT_AMBULATORY_CARE_PROVIDER_SITE_OTHER): Payer: Self-pay | Admitting: Family Medicine

## 2022-01-05 DIAGNOSIS — E1069 Type 1 diabetes mellitus with other specified complication: Secondary | ICD-10-CM

## 2022-01-19 ENCOUNTER — Ambulatory Visit: Payer: Medicare Other | Admitting: Psychiatry

## 2022-01-26 ENCOUNTER — Ambulatory Visit: Payer: Medicare Other | Admitting: Licensed Practical Nurse

## 2022-02-03 DIAGNOSIS — J4520 Mild intermittent asthma, uncomplicated: Secondary | ICD-10-CM | POA: Diagnosis not present

## 2022-02-03 DIAGNOSIS — L03031 Cellulitis of right toe: Secondary | ICD-10-CM | POA: Diagnosis not present

## 2022-02-03 DIAGNOSIS — E1122 Type 2 diabetes mellitus with diabetic chronic kidney disease: Secondary | ICD-10-CM | POA: Diagnosis not present

## 2022-02-03 DIAGNOSIS — K3184 Gastroparesis: Secondary | ICD-10-CM | POA: Diagnosis not present

## 2022-02-03 DIAGNOSIS — E1021 Type 1 diabetes mellitus with diabetic nephropathy: Secondary | ICD-10-CM | POA: Diagnosis not present

## 2022-02-03 DIAGNOSIS — N183 Chronic kidney disease, stage 3 unspecified: Secondary | ICD-10-CM | POA: Diagnosis not present

## 2022-02-07 DIAGNOSIS — E1065 Type 1 diabetes mellitus with hyperglycemia: Secondary | ICD-10-CM | POA: Diagnosis not present

## 2022-02-07 DIAGNOSIS — J4541 Moderate persistent asthma with (acute) exacerbation: Secondary | ICD-10-CM | POA: Diagnosis not present

## 2022-02-07 DIAGNOSIS — N3281 Overactive bladder: Secondary | ICD-10-CM | POA: Insufficient documentation

## 2022-02-09 ENCOUNTER — Ambulatory Visit (INDEPENDENT_AMBULATORY_CARE_PROVIDER_SITE_OTHER): Payer: Medicare Other | Admitting: Licensed Clinical Social Worker

## 2022-02-09 DIAGNOSIS — F3162 Bipolar disorder, current episode mixed, moderate: Secondary | ICD-10-CM | POA: Diagnosis not present

## 2022-02-09 NOTE — Plan of Care (Signed)
?  Problem: Alleviate depressive/manic symptoms and return to improved levels of effective functioning. ?Goal: LTG: Stabilize mood and increase goal-directed behavior: Input needed on appropriate metric ?Outcome: Progressing ?Goal: STG: '@PREFFIRSTNAME'$ @ will attend at least 80% of scheduled follow-up counseling appointments ?Outcome: Progressing ?Intervention: REVIEW PLEASE SKILLS (TREAT PHYSICAL ILLNESS, BALANCE EATING, AVOID MOOD-ALTERING SUBSTANCES, BALANCE SLEEP AND GET EXERCISE) WITH Pierra ?Note: reviewed ?Intervention: Encourage self-care activities ?Note: Encouraged pt to make time for self care ?Intervention: Encourage family support ?Note: Encouraged pt to seek support from others that can help--pt mother willing to help in current stressful situation ?  ?

## 2022-02-09 NOTE — Progress Notes (Signed)
Virtual Visit via Video Note ? ?I connected with [name] on 02/09/22 at  9:00 AM EDT by a video enabled telemedicine application and verified that I am speaking with the correct person using two identifiers. ? ?Location: ?Patient: home ?Provider: [facility] ?  ?I discussed the limitations of evaluation and management by telemedicine and the availability of in person appointments. The patient expressed understanding and agreed to proceed. ?ment and treatment plan with the patient. The patient was provided an opportunity to ask questions and all were answered. The patient agreed with the plan and demonstrated an understanding of the instructions. ?  ?The patient was advised to call back or seek an in-person evaluation if the symptoms worsen or if the condition fails to improve as anticipated. ? ?I provided 35 minutes of non-face-to-face time during this encounter. ? ? ?Cristina Krist R Undine Nealis, [name] ? ? ?THERAPIST PROGRESS NOTE ? ?Session Time: [phone] ? ?Participation Level: Active ? ?Behavioral Response: NeatAlertAnxious and Depressed ? ?Type of Therapy: Individual Therapy ? ?Treatment Goals addressed: Problem: Alleviate depressive/manic symptoms and return to improved levels of effective functioning. ?Goal: LTG: Stabilize mood and increase goal-directed behavior: Input needed on appropriate metric ?Outcome: Progressing ?Goal: STG: '@PREFFIRSTNAME'$ @ will attend at least 80% of scheduled follow-up counseling appointments ?Outcome: Progressing ?Intervention: REVIEW PLEASE SKILLS (TREAT PHYSICAL ILLNESS, BALANCE EATING, AVOID MOOD-ALTERING SUBSTANCES, BALANCE SLEEP AND GET EXERCISE) WITH Storie ?Note: reviewed ?Intervention: Encourage self-care activities ?Note: Encouraged pt to make time for self care ?Intervention: Encourage family support ?Note: Encouraged pt to seek support from others that can help--pt mother willing to help in current stressful situation ? ?ProgressTowards Goals: Progressing ? ?Interventions:  CBT ? ?Summary: [name] is a 36 y.o. female who presents with improving symptoms related to bipolar disorder. Pt reports that overall mood has been stable and that she is managing stress and anxiety well. ? ?Allowed pt to explore and express thoughts and feelings associated with recent life situations and external stressors. Patient reports that she has current stress about decline in physical health symptoms recently--patient reports that she has asthma and patient feels that it is being triggered by living in the camper. Patient reports that she is currently seeking another place to live. Patient reports that her mother offered for her, the children, and her husband to come and stay with her for a while while they are seeking a permanent residence. Facial reports that this is triggering her emotions--having up and down days. Patient reports that her husband has a new job, and he really is enjoying it. Patient reports that she feels that her medications are not managing her depression symptoms as well as she would like. Patient admits that she missed her last appointment with her psychiatrist, but wants to make another appointment for medication management. Encouraged patient to be compliant with her medications and to be compliant with her psychiatric appointments. ? ?Continued recommendations are as follows: self care behaviors, positive social engagements, focusing on overall work/home/life balance, and focusing on positive physical and emotional wellness.  ? ? ?Suicidal/Homicidal: No ? ?Therapist Response: [name] is continuing to work hard to use coping skills to manage her symptoms. Pt is still working hard to focus on overall wellness and weight loss. Pt feels well supported by her medical and psychiatric physicians. Pt is able to identify triggers and able to sustain gains/progress.  ? ?Plan: Return again in 4 weeks. ? ?Diagnosis: Bipolar 1 disorder, mixed, moderate ([place]) ? ?Collaboration of  Care: Other pt encouraged to continue  care with psychiatrist of record, Dr. Clovis Pu ? ?Patient/Guardian was advised Release of Information must be obtained prior to any record release in order to collaborate their care with an outside provider. Patient/Guardian was advised if they have not already done so to contact the registration department to sign all necessary forms in order for [name] to release information regarding their care.  ? ?Consent: Patient/Guardian gives verbal consent for treatment and assignment of benefits for services provided during this visit. Patient/Guardian expressed understanding and agreed to proceed.  ? ?Shawana Knoch R Kennette Cuthrell, [name] ?02/09/2022 ? ?

## 2022-02-21 DIAGNOSIS — E1040 Type 1 diabetes mellitus with diabetic neuropathy, unspecified: Secondary | ICD-10-CM | POA: Diagnosis not present

## 2022-02-21 DIAGNOSIS — F418 Other specified anxiety disorders: Secondary | ICD-10-CM | POA: Diagnosis not present

## 2022-02-21 DIAGNOSIS — E1022 Type 1 diabetes mellitus with diabetic chronic kidney disease: Secondary | ICD-10-CM | POA: Diagnosis not present

## 2022-02-21 DIAGNOSIS — J45909 Unspecified asthma, uncomplicated: Secondary | ICD-10-CM | POA: Diagnosis not present

## 2022-02-21 DIAGNOSIS — Z6835 Body mass index (BMI) 35.0-35.9, adult: Secondary | ICD-10-CM | POA: Diagnosis not present

## 2022-02-21 DIAGNOSIS — E1169 Type 2 diabetes mellitus with other specified complication: Secondary | ICD-10-CM | POA: Diagnosis not present

## 2022-02-21 DIAGNOSIS — N1832 Chronic kidney disease, stage 3b: Secondary | ICD-10-CM | POA: Diagnosis not present

## 2022-02-23 ENCOUNTER — Ambulatory Visit: Payer: Medicare Other | Admitting: Obstetrics and Gynecology

## 2022-03-02 ENCOUNTER — Ambulatory Visit: Payer: Medicare Other | Admitting: Psychiatry

## 2022-03-10 DIAGNOSIS — E669 Obesity, unspecified: Secondary | ICD-10-CM | POA: Diagnosis not present

## 2022-03-10 DIAGNOSIS — J3089 Other allergic rhinitis: Secondary | ICD-10-CM | POA: Diagnosis not present

## 2022-03-10 DIAGNOSIS — G4710 Hypersomnia, unspecified: Secondary | ICD-10-CM | POA: Diagnosis not present

## 2022-03-10 DIAGNOSIS — Z6835 Body mass index (BMI) 35.0-35.9, adult: Secondary | ICD-10-CM | POA: Diagnosis not present

## 2022-03-10 DIAGNOSIS — J4550 Severe persistent asthma, uncomplicated: Secondary | ICD-10-CM | POA: Diagnosis not present

## 2022-03-19 DIAGNOSIS — G4733 Obstructive sleep apnea (adult) (pediatric): Secondary | ICD-10-CM | POA: Diagnosis not present

## 2022-03-21 ENCOUNTER — Ambulatory Visit: Payer: Medicare Other | Admitting: Family Medicine

## 2022-03-21 DIAGNOSIS — E1022 Type 1 diabetes mellitus with diabetic chronic kidney disease: Secondary | ICD-10-CM | POA: Diagnosis not present

## 2022-03-21 DIAGNOSIS — F319 Bipolar disorder, unspecified: Secondary | ICD-10-CM | POA: Diagnosis not present

## 2022-03-21 DIAGNOSIS — J45909 Unspecified asthma, uncomplicated: Secondary | ICD-10-CM | POA: Diagnosis not present

## 2022-03-21 DIAGNOSIS — N1832 Chronic kidney disease, stage 3b: Secondary | ICD-10-CM | POA: Diagnosis not present

## 2022-03-21 DIAGNOSIS — E1040 Type 1 diabetes mellitus with diabetic neuropathy, unspecified: Secondary | ICD-10-CM | POA: Diagnosis not present

## 2022-03-21 DIAGNOSIS — E1065 Type 1 diabetes mellitus with hyperglycemia: Secondary | ICD-10-CM | POA: Diagnosis not present

## 2022-03-21 DIAGNOSIS — Z6835 Body mass index (BMI) 35.0-35.9, adult: Secondary | ICD-10-CM | POA: Diagnosis not present

## 2022-03-29 ENCOUNTER — Encounter: Payer: Self-pay | Admitting: Psychiatry

## 2022-03-29 ENCOUNTER — Telehealth (INDEPENDENT_AMBULATORY_CARE_PROVIDER_SITE_OTHER): Payer: Medicare Other | Admitting: Psychiatry

## 2022-03-29 DIAGNOSIS — F4001 Agoraphobia with panic disorder: Secondary | ICD-10-CM | POA: Diagnosis not present

## 2022-03-29 DIAGNOSIS — F411 Generalized anxiety disorder: Secondary | ICD-10-CM | POA: Diagnosis not present

## 2022-03-29 DIAGNOSIS — F5105 Insomnia due to other mental disorder: Secondary | ICD-10-CM

## 2022-03-29 DIAGNOSIS — G2581 Restless legs syndrome: Secondary | ICD-10-CM

## 2022-03-29 DIAGNOSIS — F3162 Bipolar disorder, current episode mixed, moderate: Secondary | ICD-10-CM | POA: Diagnosis not present

## 2022-03-29 DIAGNOSIS — F430 Acute stress reaction: Secondary | ICD-10-CM

## 2022-03-29 MED ORDER — CARIPRAZINE HCL 4.5 MG PO CAPS: 4.5000 mg | ORAL_CAPSULE | Freq: Every day | ORAL | 1 refills | Status: DC

## 2022-03-29 MED ORDER — CLONAZEPAM 0.5 MG PO TABS: 0.5000 mg | ORAL_TABLET | Freq: Three times a day (TID) | ORAL | 0 refills | Status: DC | PRN

## 2022-03-29 NOTE — Progress Notes (Addendum)
[name] [id number] 1986-03-01 36 y.o.   Video Visit via My Chart  I connected with pt by video using My Chart and verified that I am speaking with the correct person using two identifiers.   I discussed the limitations, risks, security and privacy concerns of performing an evaluation and management service by My Chart  and the availability of in person appointments. I also discussed with the patient that there may be a patient responsible charge related to this service. The patient expressed understanding and agreed to proceed.  I discussed the assessment and treatment plan with the patient. The patient was provided an opportunity to ask questions and all were answered. The patient agreed with the plan and demonstrated an understanding of the instructions.   The patient was advised to call back or seek an in-person evaluation if the symptoms worsen or if the condition fails to improve as anticipated.  I provided 30 minutes of video time during this encounter.  The patient was located at home and the provider was located office. Session from 230 to 3 PM Subjective:   Patient ID:  [name] is a 36 y.o. (DOB May 23, 1986) female.  Chief Complaint:  Chief Complaint  Patient presents with   Anxiety   Follow-up    Bipolar 1 disorder, mixed, moderate ([place])    Depression        Associated symptoms include appetite change.  Past medical history includes anxiety.   Anxiety Symptoms include nervous/anxious behavior. Patient reports no nausea or palpitations.     [name] presents to the office today for follow-up of bipolar disorder and generalized anxiety disorder. Historically seen with [name].  When seen June 05, 2019.  The following changes were made: Change pramipexole ER and increase to 0.75 mg pm to rid nausea and improve mood in the afternoon.  For tiredness in the afternoon switch Equetro to 400 mg AM , 1@ 6 and 1 at 11. For panic  in daytime start gabapentin 300 mg each am as preventative.  She's only taking it prn now. We continued unchanged Latuda 120 mg and lamotrigine 300 mg twice daily for bipolar disorder. She just got the ER pramipexole about a week ago.  Can't tell difference with nausea DT  gastroparesis worse lately with constipation Less afternoon tiredness with current split Equetro dose.    seen July 22, 2019.  Meds were not changed at that visit.  She called back to October 20 wanting to try valproate because of racing thoughts and difficulty sleeping.  Stating the trazodone was not working.  She was told it was okay to start Depakote ER 500 mg nightly and then gradually increased to 1500 mg nightly.  She called back again on October 6 and this physician noted the following: RTC  Pt called to report new med she started having a lot of side effects mood swings, sounds in ears, suicidal thoughts, tremors. Causing issues with husband She is currently taking Depakote ER 1500 mg nightly. She commits to safety.  The only symptom directly attributable to Depakote would be the tremor.  However its presence would indicate she is not going to be able to tolerate a higher dose of Depakote to control her mood symptoms.  Therefore we will wean the Depakote.  In order to control the mood symptoms we will increase the Equetro at the evening dose which should not cause significant side effects problems during the day but help with mood stability.  This increase  should also make it easier to wean the Depakote quickly.  She agrees to this plan.  She will call back if the suicidal thoughts become too intense to tolerate.  Reduce Depakote ER 500 mg tablets to 1 a night for 4 nights then stop Increase Equetro 200 mg capsules to 2 in the morning and 3 nightly.  Cristina Parents, MD, DFAPA  She's been back on Equetro and doing OK. 2 weeks leading to Xmas with a lot of anger and lashing out a good bit.  Had run out of mirapex and  Latuda for a couple of days in that time frame.  Crying is less now.  No SE CBZ now.  A lot of migraines off her Trokendi bc risk kidney stones.  Plans Botox for HA.  Usually sleeping OK without trazodone.   Still taking Latuda, buspirone, gabapentin, lamotrigine 300 mg BID.    seen 12.30.21 without med change.    12/17/19 appt with the following noted: Since then increased gabapentin for anxiety to 600 BID-TID which helped but caused some tiredness.  Needing trazodone more bc EMA/EFA.  She's stopped caffeine.  Mood stability is getting better despite a lot of stress. $ stress and racing thoughts at night and scattered some with stressors.  Depression is mild lately.  Took trip with H last week helped.  Coming up on 2 year anniversary of B's death.  B diabetic died last year with DM and kidney failure. Not as much anger irritability.  Some mood swings early FEB but better now. Custody battle with exH.  He's nicer to her now.   Weight going up. Hates weight gain effects of meds.    Consistent with meds.  No sig SI other than fleeting rarely.  Scattered concentration and racing thoughts randomly.   Sleep OK. Plan: No med changes/ FU 4 mos  06/18/20 TC saying she stopped all psych meds end of August.  06/22/20 appt with the following noted: Had gradually dropped off meds and not sure why.  Stoped Latuda, buspirone, CBZ. Pramipexole., and lamotrigine.  Sleep Ok without trazodone.  Gabapentin less tolerated as prn. Stopped Equetro over a month ago DT insurance.  Back on regular medicare and it might be covered. A lot of stress here lately. Lost father 01/01/20 of MI.  since here and called got propranolol and it hleped crying.  Also got some clonazepam.   Had given up on life and everything.  Not SI today but has been.  No plan.  Depression and mood swings including anger.  Anxiety. She and therapist say she's depressed and recommended IOP at [facility]. Agrees to retry Seroquel which helped in the past Plan:  Acute decompensation because of psychiatric noncompliance. quetiapine ER 1 at night for 4 nights, then 2 at night for 4 nights, then 3 at night.  07/13/2020 appointment with the following noted: Patient called in between appointments complaining of quetiapine call causing restless legs.  Gabapentin was called in with instructions to make this side effect resolve. Misunderstood and only taking 300 mg Seroquel XR for a couple of days. Referred to [facility] and has continued therapy. Feels better on Seroquel.  No longer SI.  More stable.  Less anger.  Depression down to mild generally.  Fewer panic attacks. Sleeps with Seroquel 8 hours.  RLS is managed fairly well at this time. Initially too tired with Seroquel and better now. Clonazepam once in 2 weeks for panic driving. Referred to IOP and continued Seroquel ER 600 HS  08/10/20 appt with the following noted: Insurance wouldn't cover enough of IOP.  Still seeing therapist every 2 weeks. Consistent with Seroquel XR 600 daily and needs gabapentin with it DT RLS. No longer taking propranolol. Taking clonazepam only twice in last month. I feel pretty stable right now.  First Xmas without father.  Feels better than mos ago.  More stable.  Sleep good.  Hangover if late with Seroquel.  Not unusually angry, under control.  Depression is better.  Occ down day.  Function is pretty good with routine things.   Energy is not great esp in the AM Gabapentin will make her feel too drunk if dose is too high. SE Seroquel makes her hungry. Plan: no med changes  09/09/2020 appointment with the following noted: Gabapentin manages the RLS. Compliant with Seroquel XR 600 mg HS. Kind of stressed out and sort of moody.  Mild mood swings with some on edge and agitated easily.  No SI. Kind of down over weight management visit and lack of progress.  No energy to want to do anything.  Sleep is fine with Seroquel.  In bed at least 8 hours daily. Used Klonopin twice  since last visit.  Triggered panic attack last week. Back on Reglan for 2 mos. Plan: Prescription given with schedule to increase to the target dose of Wellbutrin SR 100 mg tablets 2 twice daily and topiramate 25 mg tablets 2 twice daily both to help with weight management and energy and mild depression.  12/16/2020 phone call wanting to stop Seroquel.  02/10/2021 appointment with the following noted: Missed appt here.  She stopped Seroquel on her own gradually and completely about a month ago.  Was taking it off and on.  The gabapentin was interfering with her ability to function.  Wanting to lay down.  But had to take the gabapentin bc Seroquel giving her RLS.  Still very moody but not over the edge like she was in the past. Clonazepam once or twice weekly.  Stopped gabapentin bc no longer has RLS off the Seroquel. No trouble sleeping usually. Feels agitated but not manic.  A lot of stress at home. Lives in [place] beside in-laws with conflict over $ with them.  A lot of stress right now and hurt and anger with them over things. Not many friends to talk to and not a lot of conversation with H.  Feels like needs to restart therapy. Doing video therapy with Christina Hussami. Wellbutrin seemed to help mental state some but didn't help weight much. Plan: Consider retrying Vraylar bc weight gain.  Yes Start 1.5 mg for 1 week then 3 mg daily.  04/14/2021 appointment with the following noted: Seems to be working good with Vraylar 1.5 mg and stopped Wellbutrin. No SE. She started phentermine from Dr. Juleen China with Topomax. No SE mania and no sig benefit at 1.2 tablet.  No SE. Sleep normal and not manic. Depression is better with Vraylar but down a couple of times weekly and still a lot of stress at home which affects mood. Mo in law is source of stress bc lives in camper on her property.  She hates me. Won't allow her to use washer.  She's the reason pt needs clonazepam. $ stress.  Ran out of food  stamps.  All 5 kids under their care for the summer.  She cooks for M in [employer] but won't help with cost. Disc D's psych provblems Plan no med changes  09/15/2021 appt noted:   Rougher  time last few mos. Still seeing therapist. Hand surgery October and got depressed and gained weight and stopped meds for a week or 2.  Couldn't do anything after the surgery for awhile.  Didn't feel family was helping.  Worry over $.  H not working much and she's on disability.  Still living in camper which is falling apart. Has talked to H about helping and he's looking for a job.  Stress is usual cause of her depression and anxiety. Occ panic attacks.  Some isolation lately. ON Vraylar 3 mg daily.  No SE. Meds covered well DT MCR and MCD. M says she can tell when pt misses meds bc gets more emotional and posts on Facebook change.  03/29/22 appt noted: Mostly compliant with Vraylar.  No SE. Stressed last couple of weeks and a little more depressed and irritable. Small things set me off.  Feels depression is a factor and more tearful.  Stress between she and H and housing problems.  Living in camper for 2 years.  Failed attempt to move. Taking clonazepam 0.5 mg 3-4 times per week.   Therapist   Remarried April 2020.  36 yo D ADD is stressful and disrespectful. Disability review approved mostly for psych reasons.  Going to [facility] weight management and just started vitamin D. Level 24.5.  Past Psychiatric Medication Trials:  lamotrigine 300 twice daily, Equetro 900 daily,  Depakote 1500 SE,  History topiramate for migraine Vraylar 3,  Abilify 15,  lithium felt slowed down,  Seroquel worked but stopped DT insurance and SE RLS and hunger,  Latuda 120, sertraline, duloxetine,   gabapentin 600 mg 3 times daily tiredness,  buspirone 30 twice daily,,  clonazepam, trazodone, Xanax , hydroxyzine NR,  History of suicidal ideation and about 5 suicide attempts with about 4 psychiatric hospitalizations  Review of  Systems:  Review of Systems  Constitutional:  Positive for appetite change and unexpected weight change.  Cardiovascular:  Negative for palpitations.  Gastrointestinal:  Negative for nausea and vomiting.  Musculoskeletal:  Positive for arthralgias.       Recent hand surgery healing  Neurological:  Negative for tremors.  Psychiatric/Behavioral:  Negative for sleep disturbance. The patient is nervous/anxious.     Medications: I have reviewed the patient's current medications.  Current Outpatient Medications  Medication Sig Dispense Refill   albuterol (PROVENTIL) (2.5 MG/3ML) 0.083% nebulizer solution Take 3 mLs (2.5 mg total) by nebulization every 6 (six) hours as needed for wheezing or shortness of breath. 150 mL 1   albuterol (VENTOLIN HFA) 108 (90 Base) MCG/ACT inhaler TAKE 2 PUFFS BY MOUTH EVERY 6 HOURS AS NEEDED FOR WHEEZE OR SHORTNESS OF BREATH 18 each 2   amLODipine (NORVASC) 2.5 MG tablet Take 2.5 mg by mouth daily.     budesonide-formoterol (SYMBICORT) 80-4.5 MCG/ACT inhaler Inhale 2 puffs into the lungs 2 (two) times daily. RINSE MOUTH WITH WATER AND SPIT AFTER EVERY USE 1 each 1   fluticasone (FLONASE) 50 MCG/ACT nasal spray Place 2 sprays into both nostrils daily. (Patient taking differently: Place 2 sprays into both nostrils daily as needed (seasonal allergies.).) 16 g 2   furosemide (LASIX) 40 MG tablet Take 40 mg by mouth every other day.      GLUCAGON EMERGENCY 1 MG injection Inject 1 mg into the skin as needed (hypoglycemia.).      Insulin Human (INSULIN PUMP) SOLN Inject 1 each into the skin 3 times daily with meals, bedtime and 2 AM. Novolog: 36-38 units per day  Insulin Pen Needle 32G X 4 MM MISC 1 each by Does not apply route once a week. 50 each 0   levonorgestrel (MIRENA, 52 MG,) 20 MCG/24HR IUD 1 each by Intrauterine route once.     losartan (COZAAR) 25 MG tablet Take 25 mg by mouth daily.     montelukast (SINGULAIR) 10 MG tablet Take 1 tablet (10 mg total) by mouth  at bedtime. 90 tablet 1   NOVOLOG 100 UNIT/ML injection Inject 36-38 Units into the skin daily. Via insulin pump per sliding scale     phentermine (ADIPEX-P) 37.5 MG tablet Take 1 tablet (37.5 mg total) by mouth daily before breakfast. 30 tablet 0   pravastatin (PRAVACHOL) 20 MG tablet SMARTSIG:1 Tablet(s) By Mouth Every Evening     promethazine (PHENERGAN) 25 MG tablet Take 1 tablet (25 mg total) by mouth every 8 (eight) hours as needed for nausea or vomiting. 30 tablet 0   tiZANidine (ZANAFLEX) 4 MG tablet Take 4 mg by mouth 2 (two) times daily as needed for muscle spasms.      Ubrogepant (UBRELVY) 100 MG TABS Take 100 mg by mouth daily as needed (migraine headaches.).      Cariprazine HCl (VRAYLAR) 4.5 MG CAPS Take 1 capsule (4.5 mg total) by mouth daily. 30 capsule 1   clonazePAM (KLONOPIN) 0.5 MG tablet Take 1 tablet (0.5 mg total) by mouth every 8 (eight) hours as needed. for anxiety 20 tablet 0   OZEMPIC, 0.25 OR 0.5 MG/DOSE, 2 MG/1.5ML SOPN INJECT 0.5 MG INTO THE SKIN ONCE A WEEK. (Patient not taking: Reported on 03/29/2022) 1.5 mL 0   No current facility-administered medications for this visit.    Medication Side Effects: tired in the afternoon.  Allergies:  Allergies  Allergen Reactions   Nsaids Other (See Comments)    Due to Renal disease   Other    Toradol [Ketorolac Tromethamine]     Cannot take d/t kidney issues    Ceftin Rash    Past Medical History:  Diagnosis Date   Anemia    Anemia of chronic disease 06/07/2012   Formatting of this note might be different from the original. Hematology consultation 07-13-16 (see note)--> Anemia of chronic disease Required 3 transfusions during pregnancy  Last Assessment & Plan:  Formatting of this note might be different from the original. She has now required 3 blood transfusions this pregnancy for her anemia of chronic disease. The most recent was one week ago when she bec   Anxiety    Asthma    Asthma 11/10/2013   Back pain     Bipolar 1 disorder (HCC)    Chest pain    Chronic renal failure syndrome, stage 3 (moderate) ([place])    Colles' fracture of left radius 04/10/5328   Complication of anesthesia    Constipation    Depression    Depression    Diabetic gastroparesis (HCC)    Diabetic neuropathy, type I diabetes mellitus ([place])    Diabetic ulcer of right great toe ([place]) 08/27/2020   Edema, lower extremity    Gallbladder disease    Gastroparesis    GERD (gastroesophageal reflux disease)    Headache(784.0)    Hypertension    Joint pain    Kidney stones    Palpitations    Polyneuropathy in diabetes(357.2)    PONV (postoperative nausea and vomiting)    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[place])    S/P carpal tunnel release left 10/16/19 11/04/2019   Shortness of breath  Tachycardia    baseline tachycardia    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)     Family History  Problem Relation Age of Onset   Hypertension Other    Diabetes Mother        type 2   Hypertension Mother    Obesity Mother    Diabetes Brother        type 1   Renal Disease Brother        renal failure   Hypertension Brother    Diabetes Father    Hypertension Father    High Cholesterol Father    Sleep apnea Father    Obesity Father    Heart disease Father    Breast cancer Maternal Aunt 64   Colon cancer Maternal Aunt    Uterine cancer Maternal Aunt 65       same as breast cancer   Uterine cancer Maternal Aunt 61   Rectal cancer Neg Hx    Esophageal cancer Neg Hx     Social History   Socioeconomic History   Marital status: Married    Spouse name: Not on file   Number of children: 2   Years of education: 12   Highest education level: 12th grade  Occupational History   Occupation: Materials engineer  Tobacco Use   Smoking status: Never   Smokeless tobacco: Never  Vaping Use   Vaping Use: Never used  Substance and Sexual Activity   Alcohol use: No   Drug use: No   Sexual activity: Yes     Partners: Male    Birth control/protection: I.U.D.  Other Topics Concern   Not on file  Social History Narrative   Pt has a daughter and boyfriend.    Dad passed away suddenly heart attack 12/2019   Social Determinants of Health   Financial Resource Strain: High Risk (08/24/2020)   Overall Financial Resource Strain (CARDIA)    Difficulty of Paying Living Expenses: Very hard  Food Insecurity: Food Insecurity Present (08/24/2020)   Hunger Vital Sign    Worried About Running Out of Food in the Last Year: Sometimes true    Ran Out of Food in the Last Year: Sometimes true  Transportation Needs: No Transportation Needs (08/24/2020)   PRAPARE - Hydrologist (Medical): No    Lack of Transportation (Non-Medical): No  Physical Activity: Inactive (08/24/2020)   Exercise Vital Sign    Days of Exercise per Week: 0 days    Minutes of Exercise per Session: 0 min  Stress: Stress Concern Present (08/24/2020)   [place]    Feeling of Stress : Very much  Social Connections: Moderately Integrated (08/24/2020)   Social Connection and Isolation Panel [NHANES]    Frequency of Communication with Friends and Family: More than three times a week    Frequency of Social Gatherings with Friends and Family: More than three times a week    Attends Religious Services: More than 4 times per year    Active Member of [employer] or Organizations: No    Attends Archivist Meetings: Never    Marital Status: Living with partner  Intimate Partner Violence: Not At Risk (08/24/2020)   Humiliation, Afraid, Rape, and Kick questionnaire    Fear of Current or Ex-Partner: No    Emotionally Abused: No    Physically Abused: No    Sexually Abused: No    Past Medical  History, Surgical history, Social history, and Family history were reviewed and updated as appropriate.   Please see review of systems for further details  on the patient's review from today.   Objective:   Physical Exam:  There were no vitals taken for this visit.  Physical Exam Neurological:     Mental Status: She is alert and oriented to person, place, and time.     Cranial Nerves: No dysarthria.  Psychiatric:        Attention and Perception: Attention and perception normal.        Mood and Affect: Mood is anxious and depressed.        Speech: Speech normal.        Behavior: Behavior is cooperative.        Thought Content: Thought content normal. Thought content is not paranoid or delusional. Thought content does not include homicidal or suicidal ideation. Thought content does not include suicidal plan.        Cognition and Memory: Cognition and memory normal.        Judgment: Judgment normal.     Comments: Insight intact irritable     Lab Review:     Component Value Date/Time   NA 137 04/04/2021 1250   K 4.2 04/04/2021 1250   CL 99 04/04/2021 1250   CO2 21 04/04/2021 1250   GLUCOSE 122 (H) 04/04/2021 1250   GLUCOSE 147 (H) 10/14/2019 0920   BUN 18 04/04/2021 1250   CREATININE 1.25 (H) 04/04/2021 1250   CREATININE 1.81 (H) 06/17/2018 1231   CALCIUM 9.6 04/04/2021 1250   PROT 7.1 04/04/2021 1250   ALBUMIN 4.5 04/04/2021 1250   AST 18 04/04/2021 1250   ALT 16 04/04/2021 1250   ALKPHOS 155 (H) 04/04/2021 1250   BILITOT 0.4 04/04/2021 1250   GFRNONAA 43 (L) 07/29/2020 1246   GFRNONAA 36 (L) 06/17/2018 1231   GFRAA 50 (L) 07/29/2020 1246   GFRAA 42 (L) 06/17/2018 1231       Component Value Date/Time   WBC 7.6 04/04/2021 1250   WBC 7.9 10/14/2019 0920   RBC 5.29 (H) 04/04/2021 1250   RBC 4.52 10/14/2019 0920   HGB 15.2 04/04/2021 1250   HCT 45.8 04/04/2021 1250   PLT 287 04/04/2021 1250   MCV 87 04/04/2021 1250   MCH 28.7 04/04/2021 1250   MCH 29.6 10/14/2019 0920   MCHC 33.2 04/04/2021 1250   MCHC 32.0 10/14/2019 0920   RDW 12.4 04/04/2021 1250   LYMPHSABS 2.4 04/04/2021 1250   MONOABS 0.8 10/14/2019 0920    EOSABS 0.3 04/04/2021 1250   BASOSABS 0.1 04/04/2021 1250    No results found for: "POCLITH", "LITHIUM"   No results found for: "PHENYTOIN", "PHENOBARB", "VALPROATE", "CBMZ"   .res Assessment: Plan:    Stefannie was seen today for anxiety and follow-up.  Diagnoses and all orders for this visit:  Bipolar 1 disorder, mixed, moderate (HCC) -     Cariprazine HCl (VRAYLAR) 4.5 MG CAPS; Take 1 capsule (4.5 mg total) by mouth daily.  Generalized anxiety disorder  Panic disorder with agoraphobia -     clonazePAM (KLONOPIN) 0.5 MG tablet; Take 1 tablet (0.5 mg total) by mouth every 8 (eight) hours as needed. for anxiety  Restless leg syndrome, controlled  Insomnia due to mental condition  Acute stress reaction -     clonazePAM (KLONOPIN) 0.5 MG tablet; Take 1 tablet (0.5 mg total) by mouth every 8 (eight) hours as needed. for anxiety    Greater than  50% of 30 min non face to face time with patient was spent on counseling and coordination of care. We discussed Patient with a history of multiple med failures and history of psychiatric hospitalization and suicide attempts recent worsening of bipolar depression.  Chronic stressors contribute to mood and anxiety problems.   Concern about potential weight gain with medications.  She has taken the major mood stabilizers with low weight gain potential. Discussed the pros and cons of switching mood stabilizers.  Could consider ultra low-dose lithium.     Ongoing chronic stressful situation living in a camper with her children and husband in a very small space.  Discussed what realistically meds can help and what they cannot help.  He cannot take away the stress of her current circumstances.  However she is having some depression and irritability and reactivity that could be tied with her bipolar disorder and she may benefit from increasing the [place].  She is not having side effects. Therefore increase Vraylar to 4.5 mg daily   Disc risk  Phentermine causing mania.  She understands.  Discussed potential metabolic side effects associated with atypical antipsychotics, as well as potential risk for movement side effects. Advised pt to contact office if movement side effects occur.    RLS Not a problem now.  Follow-up 2 months  Cristina Parents, MD, DFAPA   Please see After Visit Summary for patient specific instructions.  Future Appointments  Date Time Provider [place]  04/05/2022 11:00 AM Hussami, Christina R, LCSW [id number] None    No orders of the defined types were placed in this encounter.   -------------------------------

## 2022-03-31 DIAGNOSIS — J3089 Other allergic rhinitis: Secondary | ICD-10-CM | POA: Diagnosis not present

## 2022-04-05 ENCOUNTER — Ambulatory Visit (INDEPENDENT_AMBULATORY_CARE_PROVIDER_SITE_OTHER): Payer: Medicare Other | Admitting: Licensed Clinical Social Worker

## 2022-04-05 DIAGNOSIS — F411 Generalized anxiety disorder: Secondary | ICD-10-CM

## 2022-04-05 DIAGNOSIS — F3162 Bipolar disorder, current episode mixed, moderate: Secondary | ICD-10-CM

## 2022-04-05 NOTE — Progress Notes (Unsigned)
Virtual Visit via Video Note  I connected with Lakecia Deschamps on 04/05/22 at 11:00 AM EDT by a video enabled telemedicine application and verified that I am speaking with the correct person using two identifiers.  Location: Patient: home Provider: [facility]   I discussed the limitations of evaluation and management by telemedicine and the availability of in person appointments. The patient expressed understanding and agreed to proceed. ment and treatment plan with the patient. The patient was provided an opportunity to ask questions and all were answered. The patient agreed with the plan and demonstrated an understanding of the instructions.   The patient was advised to call back or seek an in-person evaluation if the symptoms worsen or if the condition fails to improve as anticipated.  I provided 35 minutes of non-face-to-face time during this encounter.   [place], LCSW   THERAPIST PROGRESS NOTE  Session Time: [id number]  Participation Level: Active  Behavioral Response: NeatAlertAnxious and Depressed  Type of Therapy: Individual Therapy  Treatment Goals addressed:   Problem: Alleviate depressive/manic symptoms and return to improved levels of effective functioning. Goal: LTG: Stabilize mood and increase goal-directed behavior: Input needed on appropriate metric Outcome: Progressing Goal: STG: '@PREFFIRSTNAME'$ @ will attend at least 80% of scheduled follow-up counseling appointments Outcome: Progressing Intervention: Monitor coping skills and behavior Note: reviewed Intervention: WORK WITH Shermika TO IDENTIFY THE MAJOR COMPONENTS OF A RECENT EPISODE OF DEPRESSION: PHYSICAL SYMPTOMS, MAJOR THOUGHTS AND IMAGES, AND MAJOR BEHAVIORS THEY EXPERIENCED Note: Explored Intervention: Encourage self-care activities Note: reviewed  ProgressTowards Goals: Progressing  Interventions: CBT  Summary: Delenn Ahn is a 36 y.o.  female who presents with improving symptoms related to bipolar disorder. Pt reports that her psychiatrist recently changed her Vraylar medication due to increase in depression symptoms when she had last visit with him. Pt reports fluctuating quality and quantity of sleep.  Allowed pt to explore and express thoughts and feelings associated with recent life situations and external stressors. Patient reports that she continues to experience stress associated with seeking a new home. Patient reports that her husband is continuing to work for the individual that he has been doing jobs for recently, but is seeking a more permanent position. Patient reports that there has been some conflict between her and her spouse recently.   Patient reports that she has all five children with her throughout the summertime, and this also triggers some stress at times. Patient also reports that she enjoys being with the children, so this is also a positive time for her. Patient reports that stress is a trigger for her bipolar disorder and will often trigger anxiety and nervousness or depression symptoms.   Allowed patient to identify stress management techniques that work best for her. Encouraged patient to continue to focus on self-care and life balance. Patient reports that her mother is a good support system for her. Patient reports that she wants to get back to a place where she is doing her devotions on a daily basis and journaling on a daily basis. Poor patient reports that she is trying to use more positive self talk when she's having negative thoughts about self.  Continued recommendations are as follows: self care behaviors, positive social engagements, focusing on overall work/home/life balance, and focusing on positive physical and emotional wellness.   Suicidal/Homicidal: No  Therapist Response: Shakevia is continuing to work hard to use coping skills to manage her symptoms. Pt is still working hard to focus on  overall wellness  and weight loss. Pt feels well supported by her medical and psychiatric physicians. Pt is able to identify triggers and able to sustain gains/progress.   Plan: Return again in 4 weeks.  Diagnosis:  Encounter Diagnoses  Name Primary?   Bipolar 1 disorder, mixed, moderate ([facility]) Yes   Generalized anxiety disorder     Collaboration of Care: Other pt encouraged to continue care with psychiatrist of record, Dr. Clovis Pu  Patient/Guardian was advised Release of Information must be obtained prior to any record release in order to collaborate their care with an outside provider. Patient/Guardian was advised if they have not already done so to contact the registration department to sign all necessary forms in order for [name] to release information regarding their care.   Consent: Patient/Guardian gives verbal consent for treatment and assignment of benefits for services provided during this visit. Patient/Guardian expressed understanding and agreed to proceed.   [place], LCSW 04/05/2022

## 2022-04-05 NOTE — Patient Instructions (Addendum)
Continue with daily physical activity Increase self care behaviors including journaling, devotions/spiritual awareness Use positive self talk on bad days

## 2022-04-05 NOTE — Plan of Care (Signed)
  Problem: Alleviate depressive/manic symptoms and return to improved levels of effective functioning. Goal: LTG: Stabilize mood and increase goal-directed behavior: Input needed on appropriate metric Outcome: Progressing Goal: STG: '@PREFFIRSTNAME'$ @ will attend at least 80% of scheduled follow-up counseling appointments Outcome: Progressing Intervention: Monitor coping skills and behavior Note: reviewed Intervention: WORK WITH [name] TO IDENTIFY THE MAJOR COMPONENTS OF A RECENT EPISODE OF DEPRESSION: PHYSICAL SYMPTOMS, MAJOR THOUGHTS AND IMAGES, AND MAJOR BEHAVIORS THEY EXPERIENCED Note: Explored Intervention: Encourage self-care activities Note: reviewed

## 2022-04-06 DIAGNOSIS — Z6835 Body mass index (BMI) 35.0-35.9, adult: Secondary | ICD-10-CM | POA: Diagnosis not present

## 2022-04-06 DIAGNOSIS — G4733 Obstructive sleep apnea (adult) (pediatric): Secondary | ICD-10-CM | POA: Diagnosis not present

## 2022-04-06 DIAGNOSIS — J4540 Moderate persistent asthma, uncomplicated: Secondary | ICD-10-CM | POA: Diagnosis not present

## 2022-04-06 DIAGNOSIS — J3089 Other allergic rhinitis: Secondary | ICD-10-CM | POA: Diagnosis not present

## 2022-05-09 DIAGNOSIS — E139 Other specified diabetes mellitus without complications: Secondary | ICD-10-CM | POA: Diagnosis not present

## 2022-05-09 DIAGNOSIS — N1832 Chronic kidney disease, stage 3b: Secondary | ICD-10-CM | POA: Diagnosis not present

## 2022-05-09 DIAGNOSIS — F5081 Binge eating disorder: Secondary | ICD-10-CM | POA: Diagnosis not present

## 2022-05-09 DIAGNOSIS — Z6835 Body mass index (BMI) 35.0-35.9, adult: Secondary | ICD-10-CM | POA: Diagnosis not present

## 2022-05-17 ENCOUNTER — Encounter (INDEPENDENT_AMBULATORY_CARE_PROVIDER_SITE_OTHER): Payer: Self-pay

## 2022-05-18 ENCOUNTER — Ambulatory Visit (INDEPENDENT_AMBULATORY_CARE_PROVIDER_SITE_OTHER): Payer: Medicare Other | Admitting: Licensed Clinical Social Worker

## 2022-05-18 DIAGNOSIS — F3162 Bipolar disorder, current episode mixed, moderate: Secondary | ICD-10-CM

## 2022-05-19 NOTE — Progress Notes (Signed)
THERAPIST PROGRESS NOTE  Session Time: 2-3p  [name] in office visit for patient and LCSW clinician  Participation Level: Active  Behavioral Response: NeatAlertAnxious and Depressed  Type of Therapy: Individual Therapy  Treatment Goals addressed:  Problem: Alleviate depressive/manic symptoms and return to improved levels of effective functioning. Goal: LTG: Stabilize mood and increase goal-directed behavior: Input needed on appropriate metric Outcome: Progressing Goal: STG: '@PREFFIRSTNAME'$ @ will attend at least 80% of scheduled follow-up counseling appointments Outcome: Progressing Intervention: Encourage self-care activities Note: Continued to enocurage Intervention: PROVIDE [place] EDUCATION ON COMMUNICATION PATTERNS IN RELATIONSHIPS Note: Discussed recent concerns and communication patterns Intervention: Discuss self-management skills Note: Encouraged: strengths identification, using "wise mind" to problem solve versus "emotion mind", practice stress management and self care behaviors   ProgressTowards Goals: Progressing  Interventions: CBT, DBT, and Family Systems  Summary: [name] is a 36 y.o. female who presents with improving symptoms related to bipolar disorder.   Allowed pt to explore and express thoughts and feelings associated with recent life situations and external stressors.   Patient reports that she has all five children with her throughout the summertime, and this also triggers some stress at times. Patient also reports that she enjoys being with the children, so this is also a positive time for her. Patient reports that stress is a trigger for her bipolar disorder and will often trigger anxiety and nervousness or depression symptoms.   Allowed patient to identify stress management techniques that work best for her. Encouraged patient to continue to focus on self-care and life balance. Patient reports that her  mother is a good support system for her. Patient reports that she wants to get back to a place where she is doing her devotions on a daily basis and journaling on a daily basis. Poor patient reports that she is trying to use more positive self talk when she's having negative thoughts about self.  Discussed relationship with husband--pt is torn as to whether she stays in the relationship or if she should leave and live with her mother with her kids. Pt feels her husband is not putting forth efforts to change the fact that pt and her children are still living in a camper in the driveway of his parents home while he and his children are living in the house. Pt reports that her mother has offered for her and the children to live with her and pt feels that is the right thing to do.   Encouraged pt to identify strengths, practice self care, and to reach out to family supports to help guide her through tough life decisions.   Continued recommendations are as follows: self care behaviors, positive social engagements, focusing on overall work/home/life balance, and focusing on positive physical and emotional wellness.   Suicidal/Homicidal: No  Therapist Response: [name] is continuing to work hard to use coping skills to manage her symptoms. Pt is still working hard to focus on overall wellness and weight loss. Pt feels well supported by her medical and psychiatric physicians. Pt is able to identify triggers and able to sustain gains/progress.   Plan: Return again in 4 weeks.  Diagnosis:  Encounter Diagnosis  Name Primary?   Bipolar 1 disorder, mixed, moderate ([place]) Yes    Collaboration of Care: Other pt encouraged to continue care with psychiatrist of record, Dr. Clovis Pu  Patient/Guardian was advised Release of Information must be obtained prior to any record release in order to collaborate their care with an outside provider. Patient/Guardian  was advised if they have not already done so to contact the  registration department to sign all necessary forms in order for [name] to release information regarding their care.   Consent: Patient/Guardian gives verbal consent for treatment and assignment of benefits for services provided during this visit. Patient/Guardian expressed understanding and agreed to proceed.   [place], LCSW 05/19/2022

## 2022-05-19 NOTE — Plan of Care (Signed)
  Problem: Alleviate depressive/manic symptoms and return to improved levels of effective functioning. Goal: LTG: Stabilize mood and increase goal-directed behavior: Input needed on appropriate metric Outcome: Progressing Goal: STG: '@PREFFIRSTNAME'$ @ will attend at least 80% of scheduled follow-up counseling appointments Outcome: Progressing Intervention: Encourage self-care activities Note: Continued to enocurage Intervention: PROVIDE [employer] EDUCATION ON COMMUNICATION PATTERNS IN RELATIONSHIPS Note: Discussed recent concerns and communication patterns Intervention: Discuss self-management skills Note: Encouraged: strengths identification, using "wise mind" to problem solve versus "emotion mind", practice stress management and self care behaviors

## 2022-06-22 ENCOUNTER — Ambulatory Visit (INDEPENDENT_AMBULATORY_CARE_PROVIDER_SITE_OTHER): Payer: Medicare Other | Admitting: Psychiatry

## 2022-06-22 ENCOUNTER — Encounter: Payer: Self-pay | Admitting: Psychiatry

## 2022-06-22 DIAGNOSIS — F4001 Agoraphobia with panic disorder: Secondary | ICD-10-CM | POA: Diagnosis not present

## 2022-06-22 DIAGNOSIS — F411 Generalized anxiety disorder: Secondary | ICD-10-CM

## 2022-06-22 DIAGNOSIS — F3162 Bipolar disorder, current episode mixed, moderate: Secondary | ICD-10-CM | POA: Diagnosis not present

## 2022-06-22 DIAGNOSIS — F430 Acute stress reaction: Secondary | ICD-10-CM

## 2022-06-22 DIAGNOSIS — G2581 Restless legs syndrome: Secondary | ICD-10-CM | POA: Diagnosis not present

## 2022-06-22 DIAGNOSIS — F5105 Insomnia due to other mental disorder: Secondary | ICD-10-CM

## 2022-06-22 MED ORDER — VRAYLAR 4.5 MG PO CAPS: 4.5000 mg | ORAL_CAPSULE | Freq: Every day | ORAL | 1 refills | Status: DC

## 2022-06-22 NOTE — Progress Notes (Signed)
[name] [id number] 02/16/1986 36 y.o.    Subjective:   Patient ID:  [name] is a 36 y.o. (DOB 1986-02-19) female.  Chief Complaint:  Chief Complaint  Patient presents with   Follow-up   Depression   Anxiety   Stress    Depression        Associated symptoms include appetite change.  Past medical history includes anxiety.   Anxiety Symptoms include nervous/anxious behavior. Patient reports no nausea or palpitations.     [name] presents to the office today for follow-up of bipolar disorder and generalized anxiety disorder. Historically seen with [name].  When seen June 05, 2019.  The following changes were made: Change pramipexole ER and increase to 0.75 mg pm to rid nausea and improve mood in the afternoon.  For tiredness in the afternoon switch Equetro to 400 mg AM , 1@ 6 and 1 at 11. For panic in daytime start gabapentin 300 mg each am as preventative.  She's only taking it prn now. We continued unchanged Latuda 120 mg and lamotrigine 300 mg twice daily for bipolar disorder. She just got the ER pramipexole about a week ago.  Can't tell difference with nausea DT  gastroparesis worse lately with constipation Less afternoon tiredness with current split Equetro dose.    seen July 22, 2019.  Meds were not changed at that visit.  She called back to October 20 wanting to try valproate because of racing thoughts and difficulty sleeping.  Stating the trazodone was not working.  She was told it was okay to start Depakote ER 500 mg nightly and then gradually increased to 1500 mg nightly.  She called back again on October 6 and this physician noted the following: RTC  Pt called to report new med she started having a lot of side effects mood swings, sounds in ears, suicidal thoughts, tremors. Causing issues with husband She is currently taking Depakote ER 1500 mg nightly. She commits to safety.  The only symptom directly  attributable to Depakote would be the tremor.  However its presence would indicate she is not going to be able to tolerate a higher dose of Depakote to control her mood symptoms.  Therefore we will wean the Depakote.  In order to control the mood symptoms we will increase the Equetro at the evening dose which should not cause significant side effects problems during the day but help with mood stability.  This increase should also make it easier to wean the Depakote quickly.  She agrees to this plan.  She will call back if the suicidal thoughts become too intense to tolerate.  Reduce Depakote ER 500 mg tablets to 1 a night for 4 nights then stop Increase Equetro 200 mg capsules to 2 in the morning and 3 nightly.  Cristina Parents, [name], [name]  She's been back on Equetro and doing OK. 2 weeks leading to Xmas with a lot of anger and lashing out a good bit.  Had run out of mirapex and Latuda for a couple of days in that time frame.  Crying is less now.  No SE CBZ now.  A lot of migraines off her Trokendi bc risk kidney stones.  Plans Botox for HA.  Usually sleeping OK without trazodone.   Still taking Latuda, buspirone, gabapentin, lamotrigine 300 mg BID.    seen 12.30.21 without med change.    12/17/19 appt with the following noted: Since then increased gabapentin for anxiety to 600 BID-TID which helped but  caused some tiredness.  Needing trazodone more bc EMA/EFA.  She's stopped caffeine.  Mood stability is getting better despite a lot of stress. $ stress and racing thoughts at night and scattered some with stressors.  Depression is mild lately.  Took trip with H last week helped.  Coming up on 2 year anniversary of B's death.  B diabetic died last year with DM and kidney failure. Not as much anger irritability.  Some mood swings early FEB but better now. Custody battle with exH.  He's nicer to her now.   Weight going up. Hates weight gain effects of meds.    Consistent with meds.  No sig SI other than  fleeting rarely.  Scattered concentration and racing thoughts randomly.   Sleep OK. Plan: No med changes/ FU 4 mos  06/18/20 TC saying she stopped all psych meds end of August.  06/22/20 appt with the following noted: Had gradually dropped off meds and not sure why.  Stoped Latuda, buspirone, CBZ. Pramipexole., and lamotrigine.  Sleep Ok without trazodone.  Gabapentin less tolerated as prn. Stopped Equetro over a month ago DT insurance.  Back on regular medicare and it might be covered. A lot of stress here lately. Lost father 01/01/20 of MI.  since here and called got propranolol and it hleped crying.  Also got some clonazepam.   Had given up on life and everything.  Not SI today but has been.  No plan.  Depression and mood swings including anger.  Anxiety. She and therapist say she's depressed and recommended IOP at [facility]. Agrees to retry Seroquel which helped in the past Plan: Acute decompensation because of psychiatric noncompliance. quetiapine ER 1 at night for 4 nights, then 2 at night for 4 nights, then 3 at night.  07/13/2020 appointment with the following noted: Patient called in between appointments complaining of quetiapine call causing restless legs.  Gabapentin was called in with instructions to make this side effect resolve. Misunderstood and only taking 300 mg Seroquel XR for a couple of days. Referred to [facility] and has continued therapy. Feels better on Seroquel.  No longer SI.  More stable.  Less anger.  Depression down to mild generally.  Fewer panic attacks. Sleeps with Seroquel 8 hours.  RLS is managed fairly well at this time. Initially too tired with Seroquel and better now. Clonazepam once in 2 weeks for panic driving. Referred to IOP and continued Seroquel ER 600 HS  08/10/20 appt with the following noted: Insurance wouldn't cover enough of IOP.  Still seeing therapist every 2 weeks. Consistent with Seroquel XR 600 daily and needs gabapentin with it DT RLS. No  longer taking propranolol. Taking clonazepam only twice in last month. I feel pretty stable right now.  First Xmas without father.  Feels better than mos ago.  More stable.  Sleep good.  Hangover if late with Seroquel.  Not unusually angry, under control.  Depression is better.  Occ down day.  Function is pretty good with routine things.   Energy is not great esp in the AM Gabapentin will make her feel too drunk if dose is too high. SE Seroquel makes her hungry. Plan: no med changes  09/09/2020 appointment with the following noted: Gabapentin manages the RLS. Compliant with Seroquel XR 600 mg HS. Kind of stressed out and sort of moody.  Mild mood swings with some on edge and agitated easily.  No SI. Kind of down over weight management visit and lack of progress.  No  energy to want to do anything.  Sleep is fine with Seroquel.  In bed at least 8 hours daily. Used Klonopin twice since last visit.  Triggered panic attack last week. Back on Reglan for 2 mos. Plan: Prescription given with schedule to increase to the target dose of Wellbutrin SR 100 mg tablets 2 twice daily and topiramate 25 mg tablets 2 twice daily both to help with weight management and energy and mild depression.  12/16/2020 phone call wanting to stop Seroquel.  02/10/2021 appointment with the following noted: Missed appt here.  She stopped Seroquel on her own gradually and completely about a month ago.  Was taking it off and on.  The gabapentin was interfering with her ability to function.  Wanting to lay down.  But had to take the gabapentin bc Seroquel giving her RLS.  Still very moody but not over the edge like she was in the past. Clonazepam once or twice weekly.  Stopped gabapentin bc no longer has RLS off the Seroquel. No trouble sleeping usually. Feels agitated but not manic.  A lot of stress at home. Lives in [place] beside in-laws with conflict over $ with them.  A lot of stress right now and hurt and anger with them over  things. Not many friends to talk to and not a lot of conversation with H.  Feels like needs to restart therapy. Doing video therapy with Christina Hussami. Wellbutrin seemed to help mental state some but didn't help weight much. Plan: Consider retrying Vraylar bc weight gain.  Yes Start 1.5 mg for 1 week then 3 mg daily.  04/14/2021 appointment with the following noted: Seems to be working good with Vraylar 1.5 mg and stopped Wellbutrin. No SE. She started phentermine from Dr. Juleen China with Topomax. No SE mania and no sig benefit at 1.2 tablet.  No SE. Sleep normal and not manic. Depression is better with Vraylar but down a couple of times weekly and still a lot of stress at home which affects mood. Mo in law is source of stress bc lives in camper on her property.  She hates me. Won't allow her to use washer.  She's the reason pt needs clonazepam. $ stress.  Ran out of food stamps.  All 5 kids under their care for the summer.  She cooks for M in [employer] but won't help with cost. Disc D's psych provblems Plan no med changes  09/15/2021 appt noted:   Rougher time last few mos. Still seeing therapist. Hand surgery October and got depressed and gained weight and stopped meds for a week or 2.  Couldn't do anything after the surgery for awhile.  Didn't feel family was helping.  Worry over $.  H not working much and she's on disability.  Still living in camper which is falling apart. Has talked to H about helping and he's looking for a job.  Stress is usual cause of her depression and anxiety. Occ panic attacks.  Some isolation lately. ON Vraylar 3 mg daily.  No SE. Meds covered well DT MCR and MCD. M says she can tell when pt misses meds bc gets more emotional and posts on Facebook change.  03/29/22 appt noted: Mostly compliant with Vraylar.  No SE. Stressed last couple of weeks and a little more depressed and irritable. Small things set me off.  Feels depression is a factor and more tearful.  Stress  between she and H and housing problems.  Living in camper for 2 years.  Failed  attempt to move. Taking clonazepam 0.5 mg 3-4 times per week.   Therapist Plan: Therefore increase Vraylar to 4.5 mg daily  06/22/2022 appointment noted: Current psych meds are Vraylar 4.5 mg daily, clonazepam 0.5 mg 3 times daily as needed anxiety Clonazepam now only a couple of times per month.   Did move into her own place for a couple of weeks. Kids are really happy alos. Did fine with increase Vraylar and feels it really helped.  Mood good and more stable. No SE. She asked about ADD bc forgets a lot and easily distracted and poor memory at times. For example when cleaning will tend to jump from one task to another.  Always tends to have racing thoughts.  Always tended to want to keep doing things.  Had these sx for years as long as can remember.  Hard to watch TV show bc always feels she has to be doing something. Stayed on task better and better mood with phentermine including less irritability.  Took up to 37.5 mg daily. Still too irritable with kids. Sleep is good usually bc tired when kids go to bed.  DM can interfere with sleep bc nocturia.  Total 6-8 hours of sleep.   Still struggling with weight.   Forgets things kids and H tell her.   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  37 yo D & 37 yo D ADD .  One of the kids father has ADHD Disability review approved mostly for psych reasons.  Going to [facility] weight management and just started vitamin D. Level 24.5.  Past Psychiatric Medication Trials:  lamotrigine 300 twice daily, Equetro 900 daily,  Depakote 1500 SE,  History topiramate for migraine Vraylar 4.5,  Abilify 15,  lithium felt slowed down,  Seroquel worked but stopped DT insurance and SE RLS and hunger,  Latuda 120, sertraline, duloxetine,   gabapentin 600 mg 3 times daily tiredness,  buspirone 30 twice daily,,  clonazepam, trazodone, Xanax ,  hydroxyzine NR, Hx phentermine seemed to help energy and mood.  Didn't help wt loss.  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  Cardiovascular:  Negative for palpitations.  Gastrointestinal:  Negative for nausea and vomiting.  Musculoskeletal:  Positive for arthralgias.       Recent hand surgery healing  Neurological:  Negative for tremors.  Psychiatric/Behavioral:  Negative for agitation and sleep disturbance. The patient is nervous/anxious.     Medications: I have reviewed the patient's current medications.  Current Outpatient Medications  Medication Sig Dispense Refill   albuterol (PROVENTIL) (2.5 MG/3ML) 0.083% nebulizer solution Take 3 mLs (2.5 mg total) by nebulization every 6 (six) hours as needed for wheezing or shortness of breath. 150 mL 1   albuterol (VENTOLIN HFA) 108 (90 Base) MCG/ACT inhaler TAKE 2 PUFFS BY MOUTH EVERY 6 HOURS AS NEEDED FOR WHEEZE OR SHORTNESS OF BREATH 18 each 2   Budeson-Glycopyrrol-Formoterol (BREZTRI AEROSPHERE) 160-9-4.8 MCG/ACT AERO Inhale 2 puffs into the lungs 2 (two) times daily.     carvedilol (COREG) 6.25 MG tablet Take 6.25 mg by mouth 2 (two) times daily.     clonazePAM (KLONOPIN) 0.5 MG tablet Take 1 tablet (0.5 mg total) by mouth every 8 (eight) hours as needed. for anxiety 20 tablet  0   fluticasone (FLONASE) 50 MCG/ACT nasal spray Place 2 sprays into both nostrils daily. (Patient taking differently: Place 2 sprays into both nostrils daily as needed (seasonal allergies.).) 16 g 2   furosemide (LASIX) 40 MG tablet Take 40 mg by mouth every other day.      GLUCAGON EMERGENCY 1 MG injection Inject 1 mg into the skin as needed (hypoglycemia.).      Insulin Human (INSULIN PUMP) SOLN Inject 1 each into the skin 3  times daily with meals, bedtime and 2 AM. Novolog: 36-38 units per day     Insulin Pen Needle 32G X 4 MM MISC 1 each by Does not apply route once a week. 50 each 0   levonorgestrel (MIRENA, 52 MG,) 20 MCG/24HR IUD 1 each by Intrauterine route once.     losartan (COZAAR) 50 MG tablet Take 50 mg by mouth daily.     montelukast (SINGULAIR) 10 MG tablet Take 1 tablet (10 mg total) by mouth at bedtime. 90 tablet 1   NOVOLOG 100 UNIT/ML injection Inject 36-38 Units into the skin daily. Via insulin pump per sliding scale     OZEMPIC, 0.25 OR 0.5 MG/DOSE, 2 MG/1.5ML SOPN INJECT 0.5 MG INTO THE SKIN ONCE A WEEK. 1.5 mL 0   pravastatin (PRAVACHOL) 20 MG tablet SMARTSIG:1 Tablet(s) By Mouth Every Evening     promethazine (PHENERGAN) 25 MG tablet Take 1 tablet (25 mg total) by mouth every 8 (eight) hours as needed for nausea or vomiting. 30 tablet 0   tiZANidine (ZANAFLEX) 4 MG tablet Take 4 mg by mouth 2 (two) times daily as needed for muscle spasms.      Ubrogepant (UBRELVY) 100 MG TABS Take 100 mg by mouth daily as needed (migraine headaches.).      Cariprazine HCl (VRAYLAR) 4.5 MG CAPS Take 1 capsule (4.5 mg total) by mouth daily. 30 capsule 1   No current facility-administered medications for this visit.    Medication Side Effects: tired in the afternoon.  Allergies:  Allergies  Allergen Reactions   Nsaids Other (See Comments)    Due to Renal disease   Other    Toradol [Ketorolac Tromethamine]     Cannot take d/t kidney issues    Ceftin Rash    Past Medical History:  Diagnosis Date   Anemia    Anemia of chronic disease 06/07/2012   Formatting of this note might be different from the original. Hematology consultation 07-13-16 (see note)--> Anemia of chronic disease Required 3 transfusions during pregnancy  Last Assessment & Plan:  Formatting of this note might be different from the original. She has now required 3 blood transfusions this pregnancy for her anemia of chronic disease. The most  recent was one week ago when she bec   Anxiety    Asthma    Asthma 11/10/2013   Back pain    Bipolar 1 disorder (HCC)    Chest pain    Chronic renal failure syndrome, stage 3 (moderate) ([place])    Colles' fracture of left radius 06/11/7901   Complication of anesthesia    Constipation    Depression    Depression    Diabetic gastroparesis (HCC)    Diabetic neuropathy, type I diabetes mellitus ([place])    Diabetic ulcer of right great toe ([place]) 08/27/2020   Edema, lower extremity    Gallbladder disease    Gastroparesis    GERD (gastroesophageal reflux disease)    Headache(784.0)    Hypertension    Joint  pain    Kidney stones    Palpitations    Polyneuropathy in diabetes(357.2)    PONV (postoperative nausea and vomiting)    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[place])    S/P carpal tunnel release left 10/16/19 11/04/2019   Shortness of breath    Tachycardia    baseline tachycardia    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[place])     Family History  Problem Relation Age of Onset   Hypertension Other    Diabetes Mother        type 2   Hypertension Mother    Obesity Mother    Diabetes Brother        type 1   Renal Disease Brother        renal failure   Hypertension Brother    Diabetes Father    Hypertension Father    High Cholesterol Father    Sleep apnea Father    Obesity Father    Heart disease Father    Breast cancer Maternal Aunt 64   Colon cancer Maternal Aunt    Uterine cancer Maternal Aunt 65       same as breast cancer   Uterine cancer Maternal Aunt 61   Rectal cancer Neg Hx    Esophageal cancer Neg Hx     Social History   Socioeconomic History   Marital status: Married    Spouse name: Not on file   Number of children: 2   Years of education: 12   Highest education level: 12th grade  Occupational History   Occupation: Materials engineer  Tobacco Use   Smoking status: Never   Smokeless tobacco: Never  Vaping Use   Vaping Use: Never  used  Substance and Sexual Activity   Alcohol use: No   Drug use: No   Sexual activity: Yes    Partners: Male    Birth control/protection: I.U.D.  Other Topics Concern   Not on file  Social History Narrative   Pt has a daughter and boyfriend.    Dad passed away suddenly heart attack 12/2019   Social Determinants of Health   Financial Resource Strain: High Risk (08/24/2020)   Overall Financial Resource Strain (CARDIA)    Difficulty of Paying Living Expenses: Very hard  Food Insecurity: Food Insecurity Present (08/24/2020)   Hunger Vital Sign    Worried About Running Out of Food in the Last Year: Sometimes true    Ran Out of Food in the Last Year: Sometimes true  Transportation Needs: No Transportation Needs (08/24/2020)   PRAPARE - Hydrologist (Medical): No    Lack of Transportation (Non-Medical): No  Physical Activity: Inactive (08/24/2020)   Exercise Vital Sign    Days of Exercise per Week: 0 days    Minutes of Exercise per Session: 0 min  Stress: Stress Concern Present (08/24/2020)   [place]    Feeling of Stress : Very much  Social Connections: Moderately Integrated (08/24/2020)   Social Connection and Isolation Panel [NHANES]    Frequency of Communication with Friends and Family: More than three times a week    Frequency of Social Gatherings with Friends and Family: More than three times a week    Attends Religious Services: More than 4 times per year    Active Member of [employer] or Organizations: No    Attends Archivist Meetings: Never    Marital Status:  Living with partner  Intimate Partner Violence: Not At Risk (08/24/2020)   Humiliation, Afraid, Rape, and Kick questionnaire    Fear of Current or Ex-Partner: No    Emotionally Abused: No    Physically Abused: No    Sexually Abused: No    Past Medical History, Surgical history, Social history, and Family  history were reviewed and updated as appropriate.   Please see review of systems for further details on the patient's review from today.   Objective:   Physical Exam:  There were no vitals taken for this visit.  Physical Exam Constitutional:      General: She is not in acute distress. Musculoskeletal:        General: No deformity.  Neurological:     Mental Status: She is alert and oriented to person, place, and time.     Cranial Nerves: No dysarthria.     Coordination: Coordination normal.  Psychiatric:        Attention and Perception: Attention and perception normal. She does not perceive auditory or visual hallucinations.        Mood and Affect: Mood is anxious. Mood is not depressed. Affect is not labile, blunt, angry or inappropriate.        Speech: Speech normal.        Behavior: Behavior normal. Behavior is cooperative.        Thought Content: Thought content normal. Thought content is not paranoid or delusional. Thought content does not include homicidal or suicidal ideation. Thought content does not include suicidal plan.        Cognition and Memory: Cognition and memory normal.        Judgment: Judgment normal.     Comments: Insight intact Irritable still some but better.       Lab Review:     Component Value Date/Time   NA 137 04/04/2021 1250   K 4.2 04/04/2021 1250   CL 99 04/04/2021 1250   CO2 21 04/04/2021 1250   GLUCOSE 122 (H) 04/04/2021 1250   GLUCOSE 147 (H) 10/14/2019 0920   BUN 18 04/04/2021 1250   CREATININE 1.25 (H) 04/04/2021 1250   CREATININE 1.81 (H) 06/17/2018 1231   CALCIUM 9.6 04/04/2021 1250   PROT 7.1 04/04/2021 1250   ALBUMIN 4.5 04/04/2021 1250   AST 18 04/04/2021 1250   ALT 16 04/04/2021 1250   ALKPHOS 155 (H) 04/04/2021 1250   BILITOT 0.4 04/04/2021 1250   GFRNONAA 43 (L) 07/29/2020 1246   GFRNONAA 36 (L) 06/17/2018 1231   GFRAA 50 (L) 07/29/2020 1246   GFRAA 42 (L) 06/17/2018 1231       Component Value Date/Time   WBC 7.6  04/04/2021 1250   WBC 7.9 10/14/2019 0920   RBC 5.29 (H) 04/04/2021 1250   RBC 4.52 10/14/2019 0920   HGB 15.2 04/04/2021 1250   HCT 45.8 04/04/2021 1250   PLT 287 04/04/2021 1250   MCV 87 04/04/2021 1250   MCH 28.7 04/04/2021 1250   MCH 29.6 10/14/2019 0920   MCHC 33.2 04/04/2021 1250   MCHC 32.0 10/14/2019 0920   RDW 12.4 04/04/2021 1250   LYMPHSABS 2.4 04/04/2021 1250   MONOABS 0.8 10/14/2019 0920   EOSABS 0.3 04/04/2021 1250   BASOSABS 0.1 04/04/2021 1250    No results found for: "POCLITH", "LITHIUM"   No results found for: "PHENYTOIN", "PHENOBARB", "VALPROATE", "CBMZ"   .res Assessment: Plan:    [name] was seen today for follow-up, depression, anxiety and stress.  Diagnoses and all orders  for this visit:  Bipolar 1 disorder, mixed, moderate (HCC) -     Cariprazine HCl (VRAYLAR) 4.5 MG CAPS; Take 1 capsule (4.5 mg total) by mouth daily.  Generalized anxiety disorder  Panic disorder with agoraphobia  Restless leg syndrome, controlled  Insomnia due to mental condition  Acute stress reaction    Greater than 50% of 30 min non face to face time with patient was spent on counseling and coordination of care. We discussed Patient with a history of multiple med failures and history of psychiatric hospitalization and suicide attempts recent worsening of bipolar depression.  Chronic stressors contribute to mood and anxiety problems.   Concern about potential weight gain with medications.  She has taken the major mood stabilizers with low weight gain potential. Discussed the pros and cons of switching mood stabilizers.  Could consider ultra low-dose lithium.     Ongoing chronic stressful situation living in a camper with her children and husband in a very small space.  Discussed what realistically meds can help and what they cannot help.  He cannot take away the stress of her current circumstances.  However she is having some depression and irritability and reactivity that  could be tied with her bipolar disorder and she may benefit from increasing the [place].  She is not having side effects. Benefit from increase Vraylar to 4.5 mg daily   Disc poss of ADD but risk ADD meds causing mood swings.  She reports hx mood benefit with phentermine but minimal wt loss.   ADD scale today  I'm not comfortable using short acting stimulants in this pt.  Shortage of Vyvanse at this time but consider.  This could help binge eating and cognitive complaints.    Still seeing Dr. Juleen China for wt loss.   Consider restarting phentermine or Vyvanse when it is available given mood benefit as well as benefit for ADD type symptoms.  Discussed potential metabolic side effects associated with atypical antipsychotics, as well as potential risk for movement side effects. Advised pt to contact office if movement side effects occur.    RLS Not a problem now.  Follow-up 2 months  Cristina Parents, [name], [name]   Please see After Visit Summary for patient specific instructions.  Future Appointments  Date Time Provider [place]  06/29/2022 10:00 AM Huss[place], Christina R, LCSW [id number] None    No orders of the defined types were placed in this encounter.   -------------------------------

## 2022-06-29 ENCOUNTER — Ambulatory Visit (INDEPENDENT_AMBULATORY_CARE_PROVIDER_SITE_OTHER): Payer: Medicare Other | Admitting: Licensed Clinical Social Worker

## 2022-06-29 DIAGNOSIS — F3162 Bipolar disorder, current episode mixed, moderate: Secondary | ICD-10-CM

## 2022-06-29 NOTE — Progress Notes (Signed)
Virtual Visit via Video Note  I connected with Daune Divirgilio on 06/29/22 at 10:00 AM EDT by a video enabled telemedicine application and verified that I am speaking with the correct person using two identifiers.  Location: Patient: home Provider: [place] Office   I discussed the limitations of evaluation and management by telemedicine and the availability of in person appointments. The patient expressed understanding and agreed to proceed.   I discussed the assessment and treatment plan with the patient. The patient was provided an opportunity to ask questions and all were answered. The patient agreed with the plan and demonstrated an understanding of the instructions.   The patient was advised to call back or seek an in-person evaluation if the symptoms worsen or if the condition fails to improve as anticipated.  I provided 45 minutes of non-face-to-face time during this encounter.   [place], LCSW   THERAPIST PROGRESS NOTE  Session Time: 1015-11a  [place] in office visit for patient and LCSW clinician  Participation Level: Active  Behavioral Response: NeatAlertAnxious and Depressed  Type of Therapy: Individual Therapy  Treatment Goals addressed: Problem: Alleviate depressive/manic symptoms and return to improved levels of effective functioning. Goal: LTG: Stabilize mood and increase goal-directed behavior: Input needed on appropriate metric Outcome: Progressing Goal: STG: '@PREFFIRSTNAME'$ @ will attend at least 80% of scheduled follow-up counseling appointments Outcome: Progressing Intervention: REVIEW PLEASE SKILLS (TREAT PHYSICAL ILLNESS, BALANCE EATING, AVOID MOOD-ALTERING SUBSTANCES, BALANCE SLEEP AND GET EXERCISE) WITH [place] Note: Reviewed   Intervention: Encourage self-care activities Note: Reviewed  Intervention: Encourage family support Note: Reviewed     ProgressTowards Goals:  Progressing  Interventions: CBT, DBT, and Family Systems  Summary: Indy Prestwood is a 36 y.o. female who presents with improving symptoms related to bipolar disorder.   Allowed pt to explore and express thoughts and feelings associated with recent life situations and external stressors. Patient reports that overall mood has improved since last session, and that she feels she is managing stress and anxiety better period patient reports that she has moved her family into a three bedroom apartment, so they are no longer living in the camper. Patient reports that she feels her overall mental health has improved significantly because she doesn't feel trapped and confined.  Patient reports that she has an upcoming psych visit for valuation of ADHD. Patient reports that she has discussed ADHD with her psychiatrist in the past.   Patient reports that things are going good with her husband, and they are focusing on improving their relationship. Patient reports that they recently went on a trip together, and had to bring one of the children with them because she was sick.  Patient reports that health wise she feels tired all of the time, and feels that her blood sugar is out of control. Patient feels that she has issues with her memory at times. Patient reports that she is doing a better job with overall life balance and feels that having a stable place to live is a great foundation.  Allowed patient to explore personal goals for herself now that she has accomplished the biggest goal of stable housing. Discussed different steps that patient needs to take to achieve goals.  Patient presents with very positive effect throughout session.  Continued recommendations are as follows: self care behaviors, positive social engagements, focusing on overall work/home/life balance, and focusing on positive physical and emotional wellness.   Suicidal/Homicidal: No  Therapist Response: Fotini is continuing  to work  hard to use coping skills to manage her symptoms. Pt is still working hard to focus on overall wellness and weight loss. Pt feels well supported by her medical and psychiatric physicians. Pt is able to identify triggers and able to sustain gains/progress.   Plan: Return again in 4 weeks.  Diagnosis:  Encounter Diagnosis  Name Primary?   Bipolar 1 disorder, mixed, moderate ([place]) Yes     Collaboration of Care: Other pt encouraged to continue care with psychiatrist of record, Dr. Clovis Pu  Patient/Guardian was advised Release of Information must be obtained prior to any record release in order to collaborate their care with an outside provider. Patient/Guardian was advised if they have not already done so to contact the registration department to sign all necessary forms in order for [name] to release information regarding their care.   Consent: Patient/Guardian gives verbal consent for treatment and assignment of benefits for services provided during this visit. Patient/Guardian expressed understanding and agreed to proceed.   [place], LCSW 06/29/2022

## 2022-07-03 NOTE — Plan of Care (Signed)
  Problem: Alleviate depressive/manic symptoms and return to improved levels of effective functioning. Goal: LTG: Stabilize mood and increase goal-directed behavior: Input needed on appropriate metric Outcome: Progressing Goal: STG: '@PREFFIRSTNAME'$ @ will attend at least 80% of scheduled follow-up counseling appointments Outcome: Progressing Intervention: REVIEW PLEASE SKILLS (TREAT PHYSICAL ILLNESS, BALANCE EATING, AVOID MOOD-ALTERING SUBSTANCES, BALANCE SLEEP AND GET EXERCISE) WITH [name] Note: Reviewed  Intervention: Encourage self-care activities Note: Reviewed  Intervention: Encourage family support Note: Reviewed

## 2022-07-18 DIAGNOSIS — Z794 Long term (current) use of insulin: Secondary | ICD-10-CM | POA: Diagnosis not present

## 2022-07-18 DIAGNOSIS — E1165 Type 2 diabetes mellitus with hyperglycemia: Secondary | ICD-10-CM | POA: Diagnosis not present

## 2022-07-18 DIAGNOSIS — F909 Attention-deficit hyperactivity disorder, unspecified type: Secondary | ICD-10-CM | POA: Diagnosis not present

## 2022-07-18 DIAGNOSIS — Z6835 Body mass index (BMI) 35.0-35.9, adult: Secondary | ICD-10-CM | POA: Diagnosis not present

## 2022-08-08 DIAGNOSIS — J45909 Unspecified asthma, uncomplicated: Secondary | ICD-10-CM | POA: Diagnosis not present

## 2022-08-08 DIAGNOSIS — I10 Essential (primary) hypertension: Secondary | ICD-10-CM | POA: Diagnosis not present

## 2022-08-08 DIAGNOSIS — Z794 Long term (current) use of insulin: Secondary | ICD-10-CM | POA: Diagnosis not present

## 2022-08-08 DIAGNOSIS — E139 Other specified diabetes mellitus without complications: Secondary | ICD-10-CM | POA: Diagnosis not present

## 2022-08-08 DIAGNOSIS — E559 Vitamin D deficiency, unspecified: Secondary | ICD-10-CM | POA: Diagnosis not present

## 2022-08-08 DIAGNOSIS — Z6835 Body mass index (BMI) 35.0-35.9, adult: Secondary | ICD-10-CM | POA: Diagnosis not present

## 2022-08-08 DIAGNOSIS — F5081 Binge eating disorder: Secondary | ICD-10-CM | POA: Diagnosis not present

## 2022-08-09 ENCOUNTER — Telehealth: Payer: Self-pay | Admitting: Psychiatry

## 2022-08-09 DIAGNOSIS — F430 Acute stress reaction: Secondary | ICD-10-CM

## 2022-08-09 DIAGNOSIS — F4001 Agoraphobia with panic disorder: Secondary | ICD-10-CM

## 2022-08-10 ENCOUNTER — Other Ambulatory Visit: Payer: Self-pay

## 2022-08-10 ENCOUNTER — Ambulatory Visit (HOSPITAL_COMMUNITY): Payer: Medicare Other | Admitting: Licensed Clinical Social Worker

## 2022-08-10 DIAGNOSIS — F430 Acute stress reaction: Secondary | ICD-10-CM

## 2022-08-10 DIAGNOSIS — F4001 Agoraphobia with panic disorder: Secondary | ICD-10-CM

## 2022-08-10 MED ORDER — CLONAZEPAM 0.5 MG PO TABS: 0.5000 mg | ORAL_TABLET | Freq: Three times a day (TID) | ORAL | 0 refills | Status: DC | PRN

## 2022-08-10 NOTE — Telephone Encounter (Signed)
Canceled Rx at [facility], repended to [facility] on [address] and updated pharmacy profile.

## 2022-08-10 NOTE — Telephone Encounter (Signed)
Pt called and said that she has moved to [place] and use [facility] [address]

## 2022-08-21 DIAGNOSIS — J4540 Moderate persistent asthma, uncomplicated: Secondary | ICD-10-CM | POA: Diagnosis not present

## 2022-08-21 DIAGNOSIS — G4733 Obstructive sleep apnea (adult) (pediatric): Secondary | ICD-10-CM | POA: Diagnosis not present

## 2022-08-21 DIAGNOSIS — Z6835 Body mass index (BMI) 35.0-35.9, adult: Secondary | ICD-10-CM | POA: Diagnosis not present

## 2022-08-21 DIAGNOSIS — J3089 Other allergic rhinitis: Secondary | ICD-10-CM | POA: Diagnosis not present

## 2022-09-06 ENCOUNTER — Encounter: Payer: Self-pay | Admitting: Psychiatry

## 2022-09-06 ENCOUNTER — Ambulatory Visit (INDEPENDENT_AMBULATORY_CARE_PROVIDER_SITE_OTHER): Payer: Medicare Other | Admitting: Psychiatry

## 2022-09-06 DIAGNOSIS — F4001 Agoraphobia with panic disorder: Secondary | ICD-10-CM

## 2022-09-06 DIAGNOSIS — F3162 Bipolar disorder, current episode mixed, moderate: Secondary | ICD-10-CM | POA: Diagnosis not present

## 2022-09-06 DIAGNOSIS — F5081 Binge eating disorder: Secondary | ICD-10-CM

## 2022-09-06 DIAGNOSIS — F902 Attention-deficit hyperactivity disorder, combined type: Secondary | ICD-10-CM | POA: Diagnosis not present

## 2022-09-06 DIAGNOSIS — F411 Generalized anxiety disorder: Secondary | ICD-10-CM

## 2022-09-06 DIAGNOSIS — G2581 Restless legs syndrome: Secondary | ICD-10-CM

## 2022-09-06 DIAGNOSIS — F5105 Insomnia due to other mental disorder: Secondary | ICD-10-CM

## 2022-09-06 MED ORDER — VRAYLAR 6 MG PO CAPS: 6.0000 mg | ORAL_CAPSULE | Freq: Every day | ORAL | 1 refills | Status: DC

## 2022-09-06 MED ORDER — LISDEXAMFETAMINE DIMESYLATE 40 MG PO CAPS: 40.0000 mg | ORAL_CAPSULE | Freq: Every day | ORAL | 0 refills | Status: DC

## 2022-09-06 NOTE — Progress Notes (Signed)
[name] [id number] December 15, 1985 36 y.o.    Subjective:   Patient ID:  [name] is a 36 y.o. (DOB November 02, 1985) female.  Chief Complaint:  Chief Complaint  Patient presents with   Follow-up    Bipolar 1 disorder, mixed, moderate (HCC)   Anxiety   ADD    Depression        Associated symptoms include appetite change.  Past medical history includes anxiety.   Anxiety Symptoms include nervous/anxious behavior. Patient reports no nausea or palpitations.     [name] presents to the office today for follow-up of bipolar disorder and generalized anxiety disorder. Historically seen with [name].  When seen June 05, 2019.  The following changes were made: Change pramipexole ER and increase to 0.75 mg pm to rid nausea and improve mood in the afternoon.  For tiredness in the afternoon switch Equetro to 400 mg AM , 1@ 6 and 1 at 11. For panic in daytime start gabapentin 300 mg each am as preventative.  She's only taking it prn now. We continued unchanged Latuda 120 mg and lamotrigine 300 mg twice daily for bipolar disorder. She just got the ER pramipexole about a week ago.  Can't tell difference with nausea DT  gastroparesis worse lately with constipation Less afternoon tiredness with current split Equetro dose.    seen July 22, 2019.  Meds were not changed at that visit.  She called back to October 20 wanting to try valproate because of racing thoughts and difficulty sleeping.  Stating the trazodone was not working.  She was told it was okay to start Depakote ER 500 mg nightly and then gradually increased to 1500 mg nightly.  She called back again on October 6 and this physician noted the following: RTC  Pt called to report new med she started having a lot of side effects mood swings, sounds in ears, suicidal thoughts, tremors. Causing issues with husband She is currently taking Depakote ER 1500 mg nightly. She commits to safety.  The  only symptom directly attributable to Depakote would be the tremor.  However its presence would indicate she is not going to be able to tolerate a higher dose of Depakote to control her mood symptoms.  Therefore we will wean the Depakote.  In order to control the mood symptoms we will increase the Equetro at the evening dose which should not cause significant side effects problems during the day but help with mood stability.  This increase should also make it easier to wean the Depakote quickly.  She agrees to this plan.  She will call back if the suicidal thoughts become too intense to tolerate.  Reduce Depakote ER 500 mg tablets to 1 a night for 4 nights then stop Increase Equetro 200 mg capsules to 2 in the morning and 3 nightly.  Cristina Parents, MD, DFAPA  She's been back on Equetro and doing OK. 2 weeks leading to Xmas with a lot of anger and lashing out a good bit.  Had run out of mirapex and Latuda for a couple of days in that time frame.  Crying is less now.  No SE CBZ now.  A lot of migraines off her Trokendi bc risk kidney stones.  Plans Botox for HA.  Usually sleeping OK without trazodone.   Still taking Latuda, buspirone, gabapentin, lamotrigine 300 mg BID.    seen 12.30.21 without med change.    12/17/19 appt with the following noted: Since then increased gabapentin for anxiety  to 600 BID-TID which helped but caused some tiredness.  Needing trazodone more bc EMA/EFA.  She's stopped caffeine.  Mood stability is getting better despite a lot of stress. $ stress and racing thoughts at night and scattered some with stressors.  Depression is mild lately.  Took trip with H last week helped.  Coming up on 2 year anniversary of B's death.  B diabetic died last year with DM and kidney failure. Not as much anger irritability.  Some mood swings early FEB but better now. Custody battle with exH.  He's nicer to her now.   Weight going up. Hates weight gain effects of meds.    Consistent with meds.  No sig  SI other than fleeting rarely.  Scattered concentration and racing thoughts randomly.   Sleep OK. Plan: No med changes/ FU 4 mos  06/18/20 TC saying she stopped all psych meds end of August.  06/22/20 appt with the following noted: Had gradually dropped off meds and not sure why.  Stoped Latuda, buspirone, CBZ. Pramipexole., and lamotrigine.  Sleep Ok without trazodone.  Gabapentin less tolerated as prn. Stopped Equetro over a month ago DT insurance.  Back on regular medicare and it might be covered. A lot of stress here lately. Lost father 01/01/20 of MI.  since here and called got propranolol and it hleped crying.  Also got some clonazepam.   Had given up on life and everything.  Not SI today but has been.  No plan.  Depression and mood swings including anger.  Anxiety. She and therapist say she's depressed and recommended IOP at [facility]. Agrees to retry Seroquel which helped in the past Plan: Acute decompensation because of psychiatric noncompliance. quetiapine ER 1 at night for 4 nights, then 2 at night for 4 nights, then 3 at night.  07/13/2020 appointment with the following noted: Patient called in between appointments complaining of quetiapine call causing restless legs.  Gabapentin was called in with instructions to make this side effect resolve. Misunderstood and only taking 300 mg Seroquel XR for a couple of days. Referred to [facility] and has continued therapy. Feels better on Seroquel.  No longer SI.  More stable.  Less anger.  Depression down to mild generally.  Fewer panic attacks. Sleeps with Seroquel 8 hours.  RLS is managed fairly well at this time. Initially too tired with Seroquel and better now. Clonazepam once in 2 weeks for panic driving. Referred to IOP and continued Seroquel ER 600 HS  08/10/20 appt with the following noted: Insurance wouldn't cover enough of IOP.  Still seeing therapist every 2 weeks. Consistent with Seroquel XR 600 daily and needs gabapentin with  it DT RLS. No longer taking propranolol. Taking clonazepam only twice in last month. I feel pretty stable right now.  First Xmas without father.  Feels better than mos ago.  More stable.  Sleep good.  Hangover if late with Seroquel.  Not unusually angry, under control.  Depression is better.  Occ down day.  Function is pretty good with routine things.   Energy is not great esp in the AM Gabapentin will make her feel too drunk if dose is too high. SE Seroquel makes her hungry. Plan: no med changes  09/09/2020 appointment with the following noted: Gabapentin manages the RLS. Compliant with Seroquel XR 600 mg HS. Kind of stressed out and sort of moody.  Mild mood swings with some on edge and agitated easily.  No SI. Kind of down over weight management visit  and lack of progress.  No energy to want to do anything.  Sleep is fine with Seroquel.  In bed at least 8 hours daily. Used Klonopin twice since last visit.  Triggered panic attack last week. Back on Reglan for 2 mos. Plan: Prescription given with schedule to increase to the target dose of Wellbutrin SR 100 mg tablets 2 twice daily and topiramate 25 mg tablets 2 twice daily both to help with weight management and energy and mild depression.  12/16/2020 phone call wanting to stop Seroquel.  02/10/2021 appointment with the following noted: Missed appt here.  She stopped Seroquel on her own gradually and completely about a month ago.  Was taking it off and on.  The gabapentin was interfering with her ability to function.  Wanting to lay down.  But had to take the gabapentin bc Seroquel giving her RLS.  Still very moody but not over the edge like she was in the past. Clonazepam once or twice weekly.  Stopped gabapentin bc no longer has RLS off the Seroquel. No trouble sleeping usually. Feels agitated but not manic.  A lot of stress at home. Lives in [place] beside in-laws with conflict over $ with them.  A lot of stress right now and hurt and anger  with them over things. Not many friends to talk to and not a lot of conversation with H.  Feels like needs to restart therapy. Doing video therapy with Christina Hussami. Wellbutrin seemed to help mental state some but didn't help weight much. Plan: Consider retrying Vraylar bc weight gain.  Yes Start 1.5 mg for 1 week then 3 mg daily.  04/14/2021 appointment with the following noted: Seems to be working good with Vraylar 1.5 mg and stopped Wellbutrin. No SE. She started phentermine from Dr. Juleen China with Topomax. No SE mania and no sig benefit at 1.2 tablet.  No SE. Sleep normal and not manic. Depression is better with Vraylar but down a couple of times weekly and still a lot of stress at home which affects mood. Mo in law is source of stress bc lives in camper on her property.  She hates me. Won't allow her to use washer.  She's the reason pt needs clonazepam. $ stress.  Ran out of food stamps.  All 5 kids under their care for the summer.  She cooks for M in [employer] but won't help with cost. Disc D's psych provblems Plan no med changes  09/15/2021 appt noted:   Rougher time last few mos. Still seeing therapist. Hand surgery October and got depressed and gained weight and stopped meds for a week or 2.  Couldn't do anything after the surgery for awhile.  Didn't feel family was helping.  Worry over $.  H not working much and she's on disability.  Still living in camper which is falling apart. Has talked to H about helping and he's looking for a job.  Stress is usual cause of her depression and anxiety. Occ panic attacks.  Some isolation lately. ON Vraylar 3 mg daily.  No SE. Meds covered well DT MCR and MCD. M says she can tell when pt misses meds bc gets more emotional and posts on Facebook change.  03/29/22 appt noted: Mostly compliant with Vraylar.  No SE. Stressed last couple of weeks and a little more depressed and irritable. Small things set me off.  Feels depression is a factor and more  tearful.  Stress between she and H and housing problems.  Living in  camper for 2 years.  Failed attempt to move. Taking clonazepam 0.5 mg 3-4 times per week.   Therapist Plan: Therefore increase Vraylar to 4.5 mg daily  06/22/2022 appointment noted: Current psych meds are Vraylar 4.5 mg daily, clonazepam 0.5 mg 3 times daily as needed anxiety Clonazepam now only a couple of times per month.   Did move into her own place for a couple of weeks. Kids are really happy alos. Did fine with increase Vraylar and feels it really helped.  Mood good and more stable. No SE. She asked about ADD bc forgets a lot and easily distracted and poor memory at times. For example when cleaning will tend to jump from one task to another.  Always tends to have racing thoughts.  Always tended to want to keep doing things.  Had these sx for years as long as can remember.  Hard to watch TV show bc always feels she has to be doing something. Stayed on task better and better mood with phentermine including less irritability.  Took up to 37.5 mg daily. Still too irritable with kids. Sleep is good usually bc tired when kids go to bed.  DM can interfere with sleep bc nocturia.  Total 6-8 hours of sleep.   Still struggling with weight.   Forgets things kids and H tell her.   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 Juleen China and tried vyvanse but out of stock. Anxiety is better than it was.  Still kind of moody a lot and forgetful a lot.  Kids ask her to do things but she forgets. Still on Vryalar 4.5 mg daily wihtout crying or sadness. No Se  Remarried April 2020.  37 yo D & 37 yo D ADD .  One of the kids father has ADHD Disability review approved mostly for psych reasons.  Going to [facility] weight management and just started vitamin D. Level 24.5.  Past Psychiatric Medication Trials:  lamotrigine 300 twice daily, Equetro 900 daily,  Depakote 1500 SE,  History topiramate for  migraine Vraylar 4.5,  Abilify 15,  lithium felt slowed down,  Seroquel worked but stopped DT insurance and SE RLS and hunger,  Latuda 120, sertraline, duloxetine,   gabapentin 600 mg 3 times daily tiredness,  buspirone 30 twice daily,,  clonazepam, trazodone, Xanax , hydroxyzine NR, Hx phentermine seemed to help energy and mood.  Didn't help wt loss.  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  Cardiovascular:  Negative for palpitations.  Gastrointestinal:  Negative for nausea and vomiting.  Musculoskeletal:  Positive for arthralgias.       Recent hand surgery healing  Neurological:  Negative for tremors.  Psychiatric/Behavioral:  Negative for agitation and sleep disturbance. The patient is nervous/anxious.     Medications: I have reviewed the patient's current medications.  Current Outpatient Medications  Medication Sig Dispense Refill   albuterol (PROVENTIL) (2.5 MG/3ML) 0.083% nebulizer solution Take 3 mLs (2.5 mg total) by nebulization every 6 (six) hours as needed for wheezing or shortness of breath. 150 mL 1   albuterol (VENTOLIN HFA) 108 (90 Base) MCG/ACT inhaler TAKE 2 PUFFS BY MOUTH EVERY 6 HOURS AS NEEDED FOR WHEEZE OR SHORTNESS OF BREATH 18 each 2   Budeson-Glycopyrrol-Formoterol (  BREZTRI AEROSPHERE) 160-9-4.8 MCG/ACT AERO Inhale 2 puffs into the lungs 2 (two) times daily.     Cariprazine HCl (VRAYLAR) 4.5 MG CAPS Take 1 capsule (4.5 mg total) by mouth daily. 30 capsule 1   Cariprazine HCl (VRAYLAR) 6 MG CAPS Take 1 capsule (6 mg total) by mouth daily. 30 capsule 1   carvedilol (COREG) 6.25 MG tablet Take 6.25 mg by mouth 2 (two) times daily.     clonazePAM (KLONOPIN) 0.5 MG tablet Take 1 tablet (0.5 mg total) by mouth every 8 (eight) hours  as needed. for anxiety 20 tablet 0   fluticasone (FLONASE) 50 MCG/ACT nasal spray Place 2 sprays into both nostrils daily. (Patient taking differently: Place 2 sprays into both nostrils daily as needed (seasonal allergies.).) 16 g 2   furosemide (LASIX) 40 MG tablet Take 40 mg by mouth every other day.      GLUCAGON EMERGENCY 1 MG injection Inject 1 mg into the skin as needed (hypoglycemia.).      Insulin Human (INSULIN PUMP) SOLN Inject 1 each into the skin 3 times daily with meals, bedtime and 2 AM. Novolog: 36-38 units per day     Insulin Pen Needle 32G X 4 MM MISC 1 each by Does not apply route once a week. 50 each 0   levonorgestrel (MIRENA, 52 MG,) 20 MCG/24HR IUD 1 each by Intrauterine route once.     lisdexamfetamine (VYVANSE) 40 MG capsule Take 1 capsule (40 mg total) by mouth daily. 30 capsule 0   losartan (COZAAR) 50 MG tablet Take 50 mg by mouth daily.     montelukast (SINGULAIR) 10 MG tablet Take 1 tablet (10 mg total) by mouth at bedtime. 90 tablet 1   NOVOLOG 100 UNIT/ML injection Inject 36-38 Units into the skin daily. Via insulin pump per sliding scale     OZEMPIC, 0.25 OR 0.5 MG/DOSE, 2 MG/1.5ML SOPN INJECT 0.5 MG INTO THE SKIN ONCE A WEEK. 1.5 mL 0   pravastatin (PRAVACHOL) 20 MG tablet SMARTSIG:1 Tablet(s) By Mouth Every Evening     promethazine (PHENERGAN) 25 MG tablet Take 1 tablet (25 mg total) by mouth every 8 (eight) hours as needed for nausea or vomiting. 30 tablet 0   tiZANidine (ZANAFLEX) 4 MG tablet Take 4 mg by mouth 2 (two) times daily as needed for muscle spasms.      Ubrogepant (UBRELVY) 100 MG TABS Take 100 mg by mouth daily as needed (migraine headaches.).      No current facility-administered medications for this visit.    Medication Side Effects: tired in the afternoon.  Allergies:  Allergies  Allergen Reactions   Nsaids Other (See Comments)    Due to Renal disease   Other    Toradol [Ketorolac Tromethamine]     Cannot take d/t kidney issues     Ceftin Rash    Past Medical History:  Diagnosis Date   Anemia    Anemia of chronic disease 06/07/2012   Formatting of this note might be different from the original. Hematology consultation 07-13-16 (see note)--> Anemia of chronic disease Required 3 transfusions during pregnancy  Last Assessment & Plan:  Formatting of this note might be different from the original. She has now required 3 blood transfusions this pregnancy for her anemia of chronic disease. The most recent was one week ago when she bec   Anxiety    Asthma    Asthma 11/10/2013   Back pain    Bipolar 1 disorder ([place])  Chest pain    Chronic renal failure syndrome, stage 3 (moderate) (HCC)    Colles' fracture of left radius 06/13/7095   Complication of anesthesia    Constipation    Depression    Depression    Diabetic gastroparesis (HCC)    Diabetic neuropathy, type I diabetes mellitus ([place])    Diabetic ulcer of right great toe ([place]) 08/27/2020   Edema, lower extremity    Gallbladder disease    Gastroparesis    GERD (gastroesophageal reflux disease)    Headache(784.0)    Hypertension    Joint pain    Kidney stones    Palpitations    Polyneuropathy in diabetes(357.2)    PONV (postoperative nausea and vomiting)    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[place])    S/P carpal tunnel release left 10/16/19 11/04/2019   Shortness of breath    Tachycardia    baseline tachycardia    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[place])     Family History  Problem Relation Age of Onset   Hypertension Other    Diabetes Mother        type 2   Hypertension Mother    Obesity Mother    Diabetes Brother        type 1   Renal Disease Brother        renal failure   Hypertension Brother    Diabetes Father    Hypertension Father    High Cholesterol Father    Sleep apnea Father    Obesity Father    Heart disease Father    Breast cancer Maternal Aunt 64   Colon cancer Maternal Aunt    Uterine cancer  Maternal Aunt 65       same as breast cancer   Uterine cancer Maternal Aunt 61   Rectal cancer Neg Hx    Esophageal cancer Neg Hx     Social History   Socioeconomic History   Marital status: Married    Spouse name: Not on file   Number of children: 2   Years of education: 12   Highest education level: 12th grade  Occupational History   Occupation: Materials engineer  Tobacco Use   Smoking status: Never   Smokeless tobacco: Never  Vaping Use   Vaping Use: Never used  Substance and Sexual Activity   Alcohol use: No   Drug use: No   Sexual activity: Yes    Partners: Male    Birth control/protection: I.U.D.  Other Topics Concern   Not on file  Social History Narrative   Pt has a daughter and boyfriend.    Dad passed away suddenly heart attack 12/2019   Social Determinants of Health   Financial Resource Strain: High Risk (08/24/2020)   Overall Financial Resource Strain (CARDIA)    Difficulty of Paying Living Expenses: Very hard  Food Insecurity: Food Insecurity Present (08/24/2020)   Hunger Vital Sign    Worried About Running Out of Food in the Last Year: Sometimes true    Ran Out of Food in the Last Year: Sometimes true  Transportation Needs: No Transportation Needs (08/24/2020)   PRAPARE - Hydrologist (Medical): No    Lack of Transportation (Non-Medical): No  Physical Activity: Inactive (08/24/2020)   Exercise Vital Sign    Days of Exercise per Week: 0 days    Minutes of Exercise per Session: 0 min  Stress: Stress Concern Present (08/24/2020)   [employer]  of Occupational Health - Occupational Stress Questionnaire    Feeling of Stress : Very much  Social Connections: Moderately Integrated (08/24/2020)   Social Connection and Isolation Panel [NHANES]    Frequency of Communication with Friends and Family: More than three times a week    Frequency of Social Gatherings with Friends and Family: More than three times a week    Attends  Religious Services: More than 4 times per year    Active Member of [employer] or Organizations: No    Attends Archivist Meetings: Never    Marital Status: Living with partner  Intimate Partner Violence: Not At Risk (08/24/2020)   Humiliation, Afraid, Rape, and Kick questionnaire    Fear of Current or Ex-Partner: No    Emotionally Abused: No    Physically Abused: No    Sexually Abused: No    Past Medical History, Surgical history, Social history, and Family history were reviewed and updated as appropriate.   Please see review of systems for further details on the patient's review from today.   Objective:   Physical Exam:  There were no vitals taken for this visit.  Physical Exam Constitutional:      General: She is not in acute distress. Musculoskeletal:        General: No deformity.  Neurological:     Mental Status: She is alert and oriented to person, place, and time.     Cranial Nerves: No dysarthria.     Coordination: Coordination normal.  Psychiatric:        Attention and Perception: Attention and perception normal. She does not perceive auditory or visual hallucinations.        Mood and Affect: Mood is anxious. Mood is not depressed. Affect is not labile, blunt, angry or inappropriate.        Speech: Speech normal.        Behavior: Behavior normal. Behavior is cooperative.        Thought Content: Thought content normal. Thought content is not paranoid or delusional. Thought content does not include homicidal or suicidal ideation. Thought content does not include suicidal plan.        Cognition and Memory: Cognition and memory normal.        Judgment: Judgment normal.     Comments: Insight intact Irritable still but not depressed     Lab Review:     Component Value Date/Time   NA 137 04/04/2021 1250   K 4.2 04/04/2021 1250   CL 99 04/04/2021 1250   CO2 21 04/04/2021 1250   GLUCOSE 122 (H) 04/04/2021 1250   GLUCOSE 147 (H) 10/14/2019 0920   BUN 18  04/04/2021 1250   CREATININE 1.25 (H) 04/04/2021 1250   CREATININE 1.81 (H) 06/17/2018 1231   CALCIUM 9.6 04/04/2021 1250   PROT 7.1 04/04/2021 1250   ALBUMIN 4.5 04/04/2021 1250   AST 18 04/04/2021 1250   ALT 16 04/04/2021 1250   ALKPHOS 155 (H) 04/04/2021 1250   BILITOT 0.4 04/04/2021 1250   GFRNONAA 43 (L) 07/29/2020 1246   GFRNONAA 36 (L) 06/17/2018 1231   GFRAA 50 (L) 07/29/2020 1246   GFRAA 42 (L) 06/17/2018 1231       Component Value Date/Time   WBC 7.6 04/04/2021 1250   WBC 7.9 10/14/2019 0920   RBC 5.29 (H) 04/04/2021 1250   RBC 4.52 10/14/2019 0920   HGB 15.2 04/04/2021 1250   HCT 45.8 04/04/2021 1250   PLT 287 04/04/2021 1250   MCV 87 04/04/2021  1250   MCH 28.7 04/04/2021 1250   MCH 29.6 10/14/2019 0920   MCHC 33.2 04/04/2021 1250   MCHC 32.0 10/14/2019 0920   RDW 12.4 04/04/2021 1250   LYMPHSABS 2.4 04/04/2021 1250   MONOABS 0.8 10/14/2019 0920   EOSABS 0.3 04/04/2021 1250   BASOSABS 0.1 04/04/2021 1250    No results found for: "POCLITH", "LITHIUM"   No results found for: "PHENYTOIN", "PHENOBARB", "VALPROATE", "CBMZ"   .res Assessment: Plan:    Kayly was seen today for follow-up, anxiety and add.  Diagnoses and all orders for this visit:  Bipolar 1 disorder, mixed, moderate (HCC) -     Cariprazine HCl (VRAYLAR) 6 MG CAPS; Take 1 capsule (6 mg total) by mouth daily.  Attention deficit hyperactivity disorder (ADHD), combined type -     lisdexamfetamine (VYVANSE) 40 MG capsule; Take 1 capsule (40 mg total) by mouth daily.  Panic disorder with agoraphobia  Generalized anxiety disorder  Restless leg syndrome, controlled  Insomnia due to mental condition  Binge-eating disorder, moderate -     lisdexamfetamine (VYVANSE) 40 MG capsule; Take 1 capsule (40 mg total) by mouth daily.    Greater than 50% of 30 min non face to face time with patient was spent on counseling and coordination of care. We discussed Patient with a history of multiple med  failures and history of psychiatric hospitalization and suicide attempts recent worsening of bipolar depression.  Chronic stressors contribute to mood and anxiety problems.   Concern about potential weight gain with medications.  She has taken the major mood stabilizers with low weight gain potential. Discussed the pros and cons of switching mood stabilizers.  Could consider ultra low-dose lithium.     Ongoing chronic stressful situation living in a camper with her children and husband in a very small space.  Discussed what realistically meds can help and what they cannot help.  He cannot take away the stress of her current circumstances.  However she is having some depression and irritability and reactivity that could be tied with her bipolar disorder and she may benefit from increasing the [place].  She is not having side effects. For irritability increase Vraylar to 6 mg daily   Disc poss of ADD but risk ADD meds causing mood swings.  She reports hx mood benefit with phentermine but minimal wt loss.   ADD scale today  I'm not comfortable using short acting stimulants in this pt.  Shortage of Vyvanse at this time but consider.  This could help binge eating and cognitive complaints.    Still seeing Dr. Juleen China for wt loss.   Consider restarting phentermine or Vyvanse when it is available given mood benefit as well as benefit for ADD type symptoms. OK trial of Vyvanse for BED and ADD.  Will try to get it again. ADHD SRS : inattentive 16, hyperactive 32= total 48, high Rx Vyvanse 40 mg AM  Use clonazepam rarely for benefit from Stimulant.  Discussed potential metabolic side effects associated with atypical antipsychotics, as well as potential risk for movement side effects. Advised pt to contact office if movement side effects occur.    RLS Not a problem now.  Follow-up 2 months  Cristina Parents, MD, DFAPA   Please see After Visit Summary for patient specific instructions.  Future  Appointments  Date Time Provider [place]  09/20/2022  9:00 AM Huss[place], Christina R, LCSW [facility] None    No orders of the defined types were placed in this encounter.   -------------------------------

## 2022-09-20 ENCOUNTER — Ambulatory Visit (INDEPENDENT_AMBULATORY_CARE_PROVIDER_SITE_OTHER): Payer: Medicare Other | Admitting: Licensed Clinical Social Worker

## 2022-09-20 DIAGNOSIS — F411 Generalized anxiety disorder: Secondary | ICD-10-CM

## 2022-09-20 DIAGNOSIS — F3162 Bipolar disorder, current episode mixed, moderate: Secondary | ICD-10-CM | POA: Diagnosis not present

## 2022-09-20 NOTE — Progress Notes (Addendum)
THERAPIST PROGRESS NOTE  Session Time: 9-10a  [place] in office visit for patient and LCSW clinician  Participation Level: Active  Behavioral Response: NeatAlertAnxious and Depressed  Type of Therapy: Individual Therapy  Treatment Goals addressed:     LTG: [name] will stabilize mood and increase goal-directed behavior as evidenced by pt self report 3 out of 5 sessions documented.   STG: [name] will identify cognitive patterns and beliefs that interfere with therapy as evidenced by pt self report 3 out of 5 sessions documented  ProgressTowards Goals: Progressing  Interventions: CBT, DBT, and Family Systems  Summary: [name] is a 36 y.o. female who presents with improving symptoms related to bipolar disorder.   Allowed pt to explore and express thoughts and feelings associated with recent life situations and external stressors. Patient reports that she is continuing to experience stress associated with being the parent of two children struggling with ADHD. Patient reports that she is having to manage behaviors both in the home and at school with her girls. Patient states that she is exploring new counseling providers for her children.  Patient reports that she is happy about recent weight loss--patient reports that she has lost 11 pounds in one month taking montjaro. patient reports that the weight loss has been beneficial for her overall blood glucose control and self esteem. Allow patient to explore her experience of weight gain, and family history of weight control (father needed gastric bypass).   Discussed patient feeling like she has ADHD--discussed focus, concentration, time management, and task completion. Patient reports that her psychiatrist gave her prescription for Vyvanse, but currently it is not in stock at the pharmacies.  Patient reports that she is also experiencing stress associated with financial concerns--patient  reports that her husband recently quit his job. Patient states that this puts her in a financial bind because she cannot afford to pay the rent and her car payment without extra support from him. Patient reports that her mother is a good emotional support system for her. Patient reports that she has had some very open communication with her husband about his job patterns in the past year. Patient reports that he has quit multiple jobs--typically he will get angry at someone at work and just not go back.  Reviewed ways of managing stress around the holidays, and encouraged patient to focus on simplicity. Reviewed strategies of managing depression symptoms, and managing anxiety symptoms.  Continued recommendations are as follows: self care behaviors, positive social engagements, focusing on overall work/home/life balance, and focusing on positive physical and emotional wellness.   Suicidal/Homicidal: No  Therapist Response: Teruko is continuing to work hard to use coping skills to manage her symptoms. Pt is still working hard to focus on overall wellness and weight loss. Pt feels well supported by her medical and psychiatric physicians. Pt is able to identify triggers and able to sustain gains/progress.   Plan: Return again in 4 weeks.  Diagnosis:  Encounter Diagnoses  Name Primary?   Bipolar 1 disorder, mixed, moderate (HCC) Yes   Generalized anxiety disorder    Collaboration of Care: Other pt encouraged to continue care with psychiatrist of record, Dr. Clovis Pu  Patient/Guardian was advised Release of Information must be obtained prior to any record release in order to collaborate their care with an outside provider. Patient/Guardian was advised if they have not already done so to contact the registration department to sign all necessary forms in order for [name] to release information regarding their care.  Consent: Patient/Guardian gives verbal consent for treatment and assignment of benefits for  services provided during this visit. Patient/Guardian expressed understanding and agreed to proceed.   Active     [facility] BIPOLAR DISORDER-MANIA/HYPOMANIA     LTG: [name] will stabilize mood and increase goal-directed behavior  as evidenced by pt self report 3 out of 5 sessions documented.  (Progressing)     Start:  09/20/22    Expected End:  05/09/23         STG: [name] will identify cognitive patterns and beliefs that interfere with therapy  as evidenced by pt self report 3 out of 5 sessions documented.  (Progressing)     Start:  09/20/22    Expected End:  05/09/23         Work with Eugene Garnet to develop a plan which includes a list of triggers for emotional dysregulation with corresponding skills to cope ahead with each trigger     Start:  09/20/22       Intervention Note     Reviewed with patient during session.             [place], LCSW 06/29/2022

## 2022-09-25 ENCOUNTER — Ambulatory Visit (INDEPENDENT_AMBULATORY_CARE_PROVIDER_SITE_OTHER): Payer: Medicare Other

## 2022-09-25 ENCOUNTER — Other Ambulatory Visit (HOSPITAL_COMMUNITY): Payer: Self-pay

## 2022-09-25 ENCOUNTER — Ambulatory Visit (INDEPENDENT_AMBULATORY_CARE_PROVIDER_SITE_OTHER): Payer: Medicare Other | Admitting: Podiatry

## 2022-09-25 DIAGNOSIS — E1149 Type 2 diabetes mellitus with other diabetic neurological complication: Secondary | ICD-10-CM | POA: Diagnosis not present

## 2022-09-25 DIAGNOSIS — M2042 Other hammer toe(s) (acquired), left foot: Secondary | ICD-10-CM

## 2022-09-25 DIAGNOSIS — E119 Type 2 diabetes mellitus without complications: Secondary | ICD-10-CM

## 2022-09-25 DIAGNOSIS — L84 Corns and callosities: Secondary | ICD-10-CM

## 2022-09-25 DIAGNOSIS — L97521 Non-pressure chronic ulcer of other part of left foot limited to breakdown of skin: Secondary | ICD-10-CM

## 2022-09-25 DIAGNOSIS — Z79899 Other long term (current) drug therapy: Secondary | ICD-10-CM

## 2022-09-25 DIAGNOSIS — M2041 Other hammer toe(s) (acquired), right foot: Secondary | ICD-10-CM

## 2022-09-25 MED ORDER — DOXYCYCLINE HYCLATE 100 MG PO TABS: 100.0000 mg | ORAL_TABLET | Freq: Two times a day (BID) | ORAL | 0 refills | Status: DC

## 2022-09-25 MED ORDER — MUPIROCIN 2 % EX OINT: 1.0000 | TOPICAL_OINTMENT | Freq: Two times a day (BID) | CUTANEOUS | 2 refills | Status: DC

## 2022-09-25 MED ORDER — LISDEXAMFETAMINE DIMESYLATE 40 MG PO CAPS
40.0000 mg | ORAL_CAPSULE | Freq: Every morning | ORAL | 0 refills | Status: DC
  Filled 2022-09-25: qty 30, 30d supply, fill #0

## 2022-09-25 MED ORDER — AMMONIUM LACTATE 12 % EX LOTN: 1.0000 | TOPICAL_LOTION | CUTANEOUS | 0 refills | Status: DC | PRN

## 2022-09-25 NOTE — Progress Notes (Unsigned)
Subjective:   Patient ID: [name], female   DOB: 36 y.o.   MRN: [id number]   HPI Chief Complaint  Patient presents with   Diabetes    Diabetic foot care, A1c- 10.2 BG- 110, Bilateral Callus, nail fungus, 3rd toe left foot ulcer, started 3 days ago, no drainage,Patient denies  any pain, X-Rays done today,     On the left 3rd toe she had some skin that she pullled off and it started bleeding.   The nail fugnus has spread, no recent treatment.     ROS      Objective:  Physical Exam  ***     Assessment:  ***     Plan:  ***

## 2022-09-25 NOTE — Patient Instructions (Signed)
Wash the ulcer with soap and water daily. Dry well. Apply a small amount of mupirocin ointment and cover with a bandage. Change twice a day. Monitor for any signs/symptoms of infection. Call the office immediately if any occur or go directly to the emergency room. Call with any questions/concerns.

## 2022-10-04 ENCOUNTER — Other Ambulatory Visit: Payer: Self-pay | Admitting: Podiatry

## 2022-10-04 DIAGNOSIS — E119 Type 2 diabetes mellitus without complications: Secondary | ICD-10-CM

## 2022-10-04 DIAGNOSIS — E1149 Type 2 diabetes mellitus with other diabetic neurological complication: Secondary | ICD-10-CM

## 2022-10-04 DIAGNOSIS — M2041 Other hammer toe(s) (acquired), right foot: Secondary | ICD-10-CM

## 2022-10-04 DIAGNOSIS — L84 Corns and callosities: Secondary | ICD-10-CM

## 2022-10-04 DIAGNOSIS — Z79899 Other long term (current) drug therapy: Secondary | ICD-10-CM

## 2022-10-04 DIAGNOSIS — L97521 Non-pressure chronic ulcer of other part of left foot limited to breakdown of skin: Secondary | ICD-10-CM

## 2022-10-10 ENCOUNTER — Other Ambulatory Visit (HOSPITAL_COMMUNITY): Payer: Self-pay

## 2022-10-10 MED ORDER — LISDEXAMFETAMINE DIMESYLATE 50 MG PO CAPS
50.0000 mg | ORAL_CAPSULE | Freq: Every morning | ORAL | 0 refills | Status: DC
  Filled 2022-10-10: qty 30, 30d supply, fill #0

## 2022-10-13 ENCOUNTER — Other Ambulatory Visit (HOSPITAL_COMMUNITY): Payer: Self-pay

## 2022-10-13 MED ORDER — MOUNJARO 2.5 MG/0.5ML ~~LOC~~ SOAJ
2.5000 mg | SUBCUTANEOUS | 0 refills | Status: DC
  Filled 2022-10-13: qty 2, 28d supply, fill #0

## 2022-10-17 ENCOUNTER — Ambulatory Visit: Payer: Medicare Other | Admitting: Podiatry

## 2022-10-19 ENCOUNTER — Ambulatory Visit (INDEPENDENT_AMBULATORY_CARE_PROVIDER_SITE_OTHER): Payer: Medicare Other | Admitting: Podiatry

## 2022-10-19 DIAGNOSIS — L97521 Non-pressure chronic ulcer of other part of left foot limited to breakdown of skin: Secondary | ICD-10-CM

## 2022-10-19 DIAGNOSIS — B351 Tinea unguium: Secondary | ICD-10-CM

## 2022-10-20 LAB — CBC WITH DIFFERENTIAL/PLATELET
Absolute Monocytes: 524 cells/uL (ref 200–950)
Basophils Absolute: 8 cells/uL (ref 0–200)
Basophils Relative: 0.1 %
Eosinophils Absolute: 0 cells/uL — ABNORMAL LOW (ref 15–500)
Eosinophils Relative: 0 %
HCT: 42.6 % (ref 35.0–45.0)
Hemoglobin: 14 g/dL (ref 11.7–15.5)
Lymphs Abs: 2014 cells/uL (ref 850–3900)
MCH: 29 pg (ref 27.0–33.0)
MCHC: 32.9 g/dL (ref 32.0–36.0)
MCV: 88.2 fL (ref 80.0–100.0)
MPV: 12.1 fL (ref 7.5–12.5)
Monocytes Relative: 6.9 %
Neutro Abs: 5054 cells/uL (ref 1500–7800)
Neutrophils Relative %: 66.5 %
Platelets: 283 10*3/uL (ref 140–400)
RBC: 4.83 10*6/uL (ref 3.80–5.10)
RDW: 12.2 % (ref 11.0–15.0)
Total Lymphocyte: 26.5 %
WBC: 7.6 10*3/uL (ref 3.8–10.8)

## 2022-10-20 LAB — COMPLETE METABOLIC PANEL WITH GFR
AG Ratio: 1.5 (calc) (ref 1.0–2.5)
ALT: 13 U/L (ref 6–29)
AST: 11 U/L (ref 10–30)
Albumin: 3.8 g/dL (ref 3.6–5.1)
Alkaline phosphatase (APISO): 119 U/L (ref 31–125)
BUN/Creatinine Ratio: 13 (calc) (ref 6–22)
BUN: 15 mg/dL (ref 7–25)
CO2: 25 mmol/L (ref 20–32)
Calcium: 9.4 mg/dL (ref 8.6–10.2)
Chloride: 102 mmol/L (ref 98–110)
Creat: 1.16 mg/dL — ABNORMAL HIGH (ref 0.50–0.97)
Globulin: 2.5 g/dL (calc) (ref 1.9–3.7)
Glucose, Bld: 325 mg/dL — ABNORMAL HIGH (ref 65–99)
Potassium: 5 mmol/L (ref 3.5–5.3)
Sodium: 137 mmol/L (ref 135–146)
Total Bilirubin: 0.4 mg/dL (ref 0.2–1.2)
Total Protein: 6.3 g/dL (ref 6.1–8.1)
eGFR: 62 mL/min/{1.73_m2} (ref >=60)

## 2022-10-23 NOTE — Progress Notes (Signed)
Subjective:   Patient ID: [name], female   DOB: 37 y.o.   MRN: [id number]   HPI Chief Complaint  Patient presents with   Follow-up    Infection in left foot, 3rd digit     37 year old female presents the office for above concerns.  She said the toe is doing better.  She has not seen any drainage or pus.  Infection has resolved.  She has not yet gotten the blood work done for the Lamisil.  Past Medical History:  Diagnosis Date   Anemia    Anemia of chronic disease 06/07/2012   Formatting of this note might be different from the original. Hematology consultation 07-13-16 (see note)--> Anemia of chronic disease Required 3 transfusions during pregnancy  Last Assessment & Plan:  Formatting of this note might be different from the original. She has now required 3 blood transfusions this pregnancy for her anemia of chronic disease. The most recent was one week ago when she bec   Anxiety    Asthma    Asthma 11/10/2013   Back pain    Bipolar 1 disorder (HCC)    Chest pain    Chronic renal failure syndrome, stage 3 (moderate) (HCC)    Colles' fracture of left radius 05/10/4234   Complication of anesthesia    Constipation    Depression    Depression    Diabetic gastroparesis (HCC)    Diabetic neuropathy, type I diabetes mellitus ([place])    Diabetic ulcer of right great toe ([place]) 08/27/2020   Edema, lower extremity    Gallbladder disease    Gastroparesis    GERD (gastroesophageal reflux disease)    Headache(784.0)    Hypertension    Joint pain    Kidney stones    Palpitations    Polyneuropathy in diabetes(357.2)    PONV (postoperative nausea and vomiting)    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[place])    S/P carpal tunnel release left 10/16/19 11/04/2019   Shortness of breath    Tachycardia    baseline tachycardia    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)     Past Surgical History:  Procedure Laterality Date   ANAL RECTAL MANOMETRY  N/A 03/25/2018   Procedure: ANO RECTAL MANOMETRY;  Surgeon: Doran Stabler, MD;  Location: [facility];  Service: Gastroenterology;  Laterality: N/A;   CARPAL TUNNEL RELEASE Left 10/16/2019   Procedure: LEFT CARPAL TUNNEL RELEASE;  Surgeon: Carole Civil, MD;  Location: [facility];  Service: Orthopedics;  Laterality: Left;   CESAREAN SECTION     x 2   CHOLECYSTECTOMY     EYE SURGERY     REFRACTIVE SURGERY Bilateral      Current Outpatient Medications:    ammonium lactate (AMLACTIN) 12 % lotion, Apply 1 Application topically as needed for dry skin., Disp: 400 g, Rfl: 0   doxycycline (VIBRA-TABS) 100 MG tablet, Take 1 tablet (100 mg total) by mouth 2 (two) times daily., Disp: 20 tablet, Rfl: 0   mupirocin ointment (BACTROBAN) 2 %, Apply 1 Application topically 2 (two) times daily., Disp: 30 g, Rfl: 2   albuterol (PROVENTIL) (2.5 MG/3ML) 0.083% nebulizer solution, Take 3 mLs (2.5 mg total) by nebulization every 6 (six) hours as needed for wheezing or shortness of breath., Disp: 150 mL, Rfl: 1   albuterol (VENTOLIN HFA) 108 (90 Base) MCG/ACT inhaler, TAKE 2 PUFFS BY MOUTH EVERY 6 HOURS AS NEEDED FOR WHEEZE OR SHORTNESS OF BREATH, Disp:  18 each, Rfl: 2   Budeson-Glycopyrrol-Formoterol (BREZTRI AEROSPHERE) 160-9-4.8 MCG/ACT AERO, Inhale 2 puffs into the lungs 2 (two) times daily., Disp: , Rfl:    Cariprazine HCl (VRAYLAR) 4.5 MG CAPS, Take 1 capsule (4.5 mg total) by mouth daily., Disp: 30 capsule, Rfl: 1   Cariprazine HCl (VRAYLAR) 6 MG CAPS, Take 1 capsule (6 mg total) by mouth daily., Disp: 30 capsule, Rfl: 1   carvedilol (COREG) 6.25 MG tablet, Take 6.25 mg by mouth 2 (two) times daily., Disp: , Rfl:    clonazePAM (KLONOPIN) 0.5 MG tablet, Take 1 tablet (0.5 mg total) by mouth every 8 (eight) hours as needed. for anxiety, Disp: 20 tablet, Rfl: 0   fluticasone (FLONASE) 50 MCG/ACT nasal spray, Place 2 sprays into both nostrils daily. (Patient taking differently: Place 2 sprays into both  nostrils daily as needed (seasonal allergies.).), Disp: 16 g, Rfl: 2   furosemide (LASIX) 40 MG tablet, Take 40 mg by mouth every other day. , Disp: , Rfl:    GLUCAGON EMERGENCY 1 MG injection, Inject 1 mg into the skin as needed (hypoglycemia.). , Disp: , Rfl:    Insulin Human (INSULIN PUMP) SOLN, Inject 1 each into the skin 3 times daily with meals, bedtime and 2 AM. Novolog: 36-38 units per day, Disp: , Rfl:    Insulin Pen Needle 32G X 4 MM MISC, 1 each by Does not apply route once a week., Disp: 50 each, Rfl: 0   levonorgestrel (MIRENA, 52 MG,) 20 MCG/24HR IUD, 1 each by Intrauterine route once., Disp: , Rfl:    lisdexamfetamine (VYVANSE) 40 MG capsule, Take 1 capsule (40 mg total) by mouth daily., Disp: 30 capsule, Rfl: 0   lisdexamfetamine (VYVANSE) 40 MG capsule, Take 1 capsule (40 mg total) by mouth in the morning., Disp: 30 capsule, Rfl: 0   losartan (COZAAR) 50 MG tablet, Take 50 mg by mouth daily., Disp: , Rfl:    montelukast (SINGULAIR) 10 MG tablet, Take 1 tablet (10 mg total) by mouth at bedtime., Disp: 90 tablet, Rfl: 1   NOVOLOG 100 UNIT/ML injection, Inject 36-38 Units into the skin daily. Via insulin pump per sliding scale, Disp: , Rfl:    OZEMPIC, 0.25 OR 0.5 MG/DOSE, 2 MG/1.5ML SOPN, INJECT 0.5 MG INTO THE SKIN ONCE A WEEK. (Patient not taking: Reported on 09/25/2022), Disp: 1.5 mL, Rfl: 0   pravastatin (PRAVACHOL) 20 MG tablet, SMARTSIG:1 Tablet(s) By Mouth Every Evening, Disp: , Rfl:    promethazine (PHENERGAN) 25 MG tablet, Take 1 tablet (25 mg total) by mouth every 8 (eight) hours as needed for nausea or vomiting., Disp: 30 tablet, Rfl: 0   tiZANidine (ZANAFLEX) 4 MG tablet, Take 4 mg by mouth 2 (two) times daily as needed for muscle spasms. , Disp: , Rfl:    Ubrogepant (UBRELVY) 100 MG TABS, Take 100 mg by mouth daily as needed (migraine headaches.). , Disp: , Rfl:   Allergies  Allergen Reactions   Nsaids Other (See Comments)    Due to Renal disease   Other    Toradol  [Ketorolac Tromethamine]     Cannot take d/t kidney issues    Ceftin Rash           Objective:  Physical Exam  General: AAO x3, NAD  Dermatological: On the left third toe appears that the area of the wound is resolved.  There is no edema, erythema or signs of infection.  There is no open lesions noted today. The nails are hypertrophic, dystrophic  with yellow, brown discoloration.  No pain to the nails.  No signs of infection to the other nails.  No open lesions.  Vascular: Dorsalis Pedis artery and Posterior Tibial artery pedal pulses are palpable bilateral with immedate capillary fill time. There is no pain with calf compression, swelling, warmth, erythema.   Neruologic: Sensation decreased with Semmes Weinstein monofilament  Musculoskeletal: No gross boney pedal deformities bilateral. No pain, crepitus, or limitation noted with foot and ankle range of motion bilateral. Muscular strength 5/5 in all groups tested bilateral.  Hammertoes present.  Gait: Unassisted, Nonantalgic.       Assessment:   Left third toe ulceration with cellulitis-resolved; onychomycosis     Plan:  -Treatment options discussed including all alternatives, risks, and complications -Etiology of symptoms were discussed -I reprinted the order for the lab work that the blood work done for the Lamisil.  Want to get the results back we will start Lamisil as long as blood work within normal limit -Otherwise infection of the wound is resolved.   -Discussed today for inspection glucose control.  Trula Slade DPM

## 2022-10-27 ENCOUNTER — Other Ambulatory Visit: Payer: Self-pay | Admitting: Podiatry

## 2022-10-27 DIAGNOSIS — Z79899 Other long term (current) drug therapy: Secondary | ICD-10-CM

## 2022-10-27 MED ORDER — TERBINAFINE HCL 250 MG PO TABS: 250.0000 mg | ORAL_TABLET | Freq: Every day | ORAL | 0 refills | Status: DC

## 2022-11-01 ENCOUNTER — Ambulatory Visit (HOSPITAL_COMMUNITY): Payer: Medicare Other | Admitting: Licensed Clinical Social Worker

## 2022-11-02 ENCOUNTER — Ambulatory Visit (INDEPENDENT_AMBULATORY_CARE_PROVIDER_SITE_OTHER): Payer: Medicare Other | Admitting: Family Medicine

## 2022-11-02 ENCOUNTER — Encounter: Payer: Self-pay | Admitting: Family Medicine

## 2022-11-02 VITALS — BP 124/82 | HR 96 | Temp 98.5°F | Ht 66.0 in | Wt 210.0 lb

## 2022-11-02 DIAGNOSIS — R079 Chest pain, unspecified: Secondary | ICD-10-CM

## 2022-11-02 DIAGNOSIS — F3163 Bipolar disorder, current episode mixed, severe, without psychotic features: Secondary | ICD-10-CM

## 2022-11-02 DIAGNOSIS — J45909 Unspecified asthma, uncomplicated: Secondary | ICD-10-CM

## 2022-11-02 DIAGNOSIS — K3184 Gastroparesis: Secondary | ICD-10-CM

## 2022-11-02 DIAGNOSIS — E1069 Type 1 diabetes mellitus with other specified complication: Secondary | ICD-10-CM

## 2022-11-02 MED ORDER — UBRELVY 100 MG PO TABS: 100.0000 mg | ORAL_TABLET | Freq: Every day | ORAL | 3 refills | Status: DC | PRN

## 2022-11-02 MED ORDER — PANTOPRAZOLE SODIUM 40 MG PO TBEC: 40.0000 mg | DELAYED_RELEASE_TABLET | Freq: Every day | ORAL | 3 refills | Status: DC

## 2022-11-02 NOTE — Progress Notes (Signed)
Subjective:    Patient ID: [name], female    DOB: 04/20/1986, 37 y.o.   MRN: [id number]  Chest Pain     Patient is a 37 year old Caucasian female who has past medical history of a cholecystectomy.  She also has a history of type 1 diabetes mellitus which is been poorly controlled in the past.  This has led to gastroparesis, nephropathy with CKD stage 3 and retinopathy.  She also suffers from asthma as well as bipolar disorder type 1.  She is here to reestablish care.  Patient reports a 1 week history of chest pain.  The pain is a sharp stabbing pain on the left side of her chest that radiates into her left shoulder blade.  It can last for an hour or so.  She denies any shortness of breath.  She denies any exertional component chest pain.  She denies any cough or pleurisy.  She states that she had a catheterization 2 years ago that showed a 20% blockage although she could not recall the artery.  EKG today shows normal sinus rhythm with normal intervals and a normal axis with no evidence of ischemia or infarction.  She is on [facility] and also has a history of gastroparesis but she denies any acid reflux. Past Medical History:  Diagnosis Date   Anemia    Anemia of chronic disease 06/07/2012   Formatting of this note might be different from the original. Hematology consultation 07-13-16 (see note)--> Anemia of chronic disease Required 3 transfusions during pregnancy  Last Assessment & Plan:  Formatting of this note might be different from the original. She has now required 3 blood transfusions this pregnancy for her anemia of chronic disease. The most recent was one week ago when she bec   Anxiety    Asthma    Asthma 11/10/2013   Back pain    Bipolar 1 disorder (HCC)    Chest pain    Chronic renal failure syndrome, stage 3 (moderate) (HCC)    Colles' fracture of left radius 11/15/9483   Complication of anesthesia    Constipation    Depression    Depression    Diabetic gastroparesis  (HCC)    Diabetic neuropathy, type I diabetes mellitus ([place])    Diabetic ulcer of right great toe ([place]) 08/27/2020   Edema, lower extremity    Gallbladder disease    Gastroparesis    GERD (gastroesophageal reflux disease)    Headache(784.0)    Hypertension    Joint pain    Kidney stones    Palpitations    Polyneuropathy in diabetes(357.2)    PONV (postoperative nausea and vomiting)    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[place])    S/P carpal tunnel release left 10/16/19 11/04/2019   Shortness of breath    Tachycardia    baseline tachycardia    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)    Past Surgical History:  Procedure Laterality Date   ANAL RECTAL MANOMETRY N/A 03/25/2018   Procedure: ANO RECTAL MANOMETRY;  Surgeon: Doran Stabler, MD;  Location: [facility];  Service: Gastroenterology;  Laterality: N/A;   CARPAL TUNNEL RELEASE Left 10/16/2019   Procedure: LEFT CARPAL TUNNEL RELEASE;  Surgeon: Carole Civil, MD;  Location: [facility];  Service: Orthopedics;  Laterality: Left;   CESAREAN SECTION     x 2   CHOLECYSTECTOMY     EYE SURGERY     REFRACTIVE SURGERY Bilateral  Current Outpatient Medications on File Prior to Visit  Medication Sig Dispense Refill   terbinafine (LAMISIL) 250 MG tablet Take 1 tablet (250 mg total) by mouth daily. 90 tablet 0   albuterol (PROVENTIL) (2.5 MG/3ML) 0.083% nebulizer solution Take 3 mLs (2.5 mg total) by nebulization every 6 (six) hours as needed for wheezing or shortness of breath. 150 mL 1   albuterol (VENTOLIN HFA) 108 (90 Base) MCG/ACT inhaler TAKE 2 PUFFS BY MOUTH EVERY 6 HOURS AS NEEDED FOR WHEEZE OR SHORTNESS OF BREATH 18 each 2   ammonium lactate (AMLACTIN) 12 % lotion Apply 1 Application topically as needed for dry skin. 400 g 0   Budeson-Glycopyrrol-Formoterol (BREZTRI AEROSPHERE) 160-9-4.8 MCG/ACT AERO Inhale 2 puffs into the lungs 2 (two) times daily.     Cariprazine HCl (VRAYLAR) 6 MG CAPS  Take 1 capsule (6 mg total) by mouth daily. 30 capsule 1   carvedilol (COREG) 6.25 MG tablet Take 6.25 mg by mouth 2 (two) times daily.     clonazePAM (KLONOPIN) 0.5 MG tablet Take 1 tablet (0.5 mg total) by mouth every 8 (eight) hours as needed. for anxiety 20 tablet 0   fluticasone (FLONASE) 50 MCG/ACT nasal spray Place 2 sprays into both nostrils daily. (Patient taking differently: Place 2 sprays into both nostrils daily as needed (seasonal allergies.).) 16 g 2   furosemide (LASIX) 40 MG tablet Take 40 mg by mouth every other day.      GLUCAGON EMERGENCY 1 MG injection Inject 1 mg into the skin as needed (hypoglycemia.).      Insulin Human (INSULIN PUMP) SOLN Inject 1 each into the skin 3 times daily with meals, bedtime and 2 AM. Novolog: 36-38 units per day     Insulin Pen Needle 32G X 4 MM MISC 1 each by Does not apply route once a week. 50 each 0   levonorgestrel (MIRENA, 52 MG,) 20 MCG/24HR IUD 1 each by Intrauterine route once.     lisdexamfetamine (VYVANSE) 50 MG capsule Take 1 capsule (50 mg total) by mouth in the morning. 30 capsule 0   losartan (COZAAR) 50 MG tablet Take 50 mg by mouth daily.     montelukast (SINGULAIR) 10 MG tablet Take 1 tablet (10 mg total) by mouth at bedtime. 90 tablet 1   mupirocin ointment (BACTROBAN) 2 % Apply 1 Application topically 2 (two) times daily. 30 g 2   NOVOLOG 100 UNIT/ML injection Inject 36-38 Units into the skin daily. Via insulin pump per sliding scale     pravastatin (PRAVACHOL) 20 MG tablet SMARTSIG:1 Tablet(s) By Mouth Every Evening     promethazine (PHENERGAN) 25 MG tablet Take 1 tablet (25 mg total) by mouth every 8 (eight) hours as needed for nausea or vomiting. 30 tablet 0   tirzepatide (MOUNJARO) 2.5 MG/0.5ML Pen Inject 2.5 mg into the skin once a week. 2 mL 0   tiZANidine (ZANAFLEX) 4 MG tablet Take 4 mg by mouth 2 (two) times daily as needed for muscle spasms.      Ubrogepant (UBRELVY) 100 MG TABS Take 100 mg by mouth daily as needed  (migraine headaches.).      No current facility-administered medications on file prior to visit.   Allergies  Allergen Reactions   Nsaids Other (See Comments)    Due to Renal disease   Other    Toradol [Ketorolac Tromethamine]     Cannot take d/t kidney issues    Ceftin Rash   Social History   Socioeconomic History  Marital status: Married    Spouse name: Not on file   Number of children: 2   Years of education: 14   Highest education level: 12th grade  Occupational History   Occupation: Materials engineer  Tobacco Use   Smoking status: Never   Smokeless tobacco: Never  Vaping Use   Vaping Use: Never used  Substance and Sexual Activity   Alcohol use: No   Drug use: No   Sexual activity: Yes    Partners: Male    Birth control/protection: I.U.D.  Other Topics Concern   Not on file  Social History Narrative   Pt has a daughter and boyfriend.    Dad passed away suddenly heart attack 12/2019   Social Determinants of Health   Financial Resource Strain: High Risk (08/24/2020)   Overall Financial Resource Strain (CARDIA)    Difficulty of Paying Living Expenses: Very hard  Food Insecurity: Food Insecurity Present (08/24/2020)   Hunger Vital Sign    Worried About Running Out of Food in the Last Year: Sometimes true    Ran Out of Food in the Last Year: Sometimes true  Transportation Needs: No Transportation Needs (08/24/2020)   PRAPARE - Hydrologist (Medical): No    Lack of Transportation (Non-Medical): No  Physical Activity: Inactive (08/24/2020)   Exercise Vital Sign    Days of Exercise per Week: 0 days    Minutes of Exercise per Session: 0 min  Stress: Stress Concern Present (08/24/2020)   [place]    Feeling of Stress : Very much  Social Connections: Moderately Integrated (08/24/2020)   Social Connection and Isolation Panel [NHANES]    Frequency of Communication with  Friends and Family: More than three times a week    Frequency of Social Gatherings with Friends and Family: More than three times a week    Attends Religious Services: More than 4 times per year    Active Member of [employer] or Organizations: No    Attends Archivist Meetings: Never    Marital Status: Living with partner  Intimate Partner Violence: Not At Risk (08/24/2020)   Humiliation, Afraid, Rape, and Kick questionnaire    Fear of Current or Ex-Partner: No    Emotionally Abused: No    Physically Abused: No    Sexually Abused: No     Review of Systems  Cardiovascular:  Positive for chest pain.  All other systems reviewed and are negative.      Objective:   Physical Exam Vitals reviewed.  Constitutional:      General: She is not in acute distress.    Appearance: She is well-developed and normal weight. She is not ill-appearing, toxic-appearing or diaphoretic.  Eyes:     Extraocular Movements: Extraocular movements intact.     Pupils: Pupils are equal, round, and reactive to light.  Cardiovascular:     Rate and Rhythm: Regular rhythm. Tachycardia present.     Heart sounds: Normal heart sounds. No murmur heard. Pulmonary:     Effort: Pulmonary effort is normal. No respiratory distress.     Breath sounds: Normal breath sounds. No stridor. No wheezing or rales.  Abdominal:     General: Bowel sounds are normal. There is no distension.     Tenderness: There is no abdominal tenderness. There is no guarding or rebound.  Neurological:     General: No focal deficit present.     Mental Status: She is alert  and oriented to person, place, and time. Mental status is at baseline.     Cranial Nerves: No cranial nerve deficit.     Sensory: No sensory deficit.     Motor: No weakness.     Coordination: Coordination normal.     Gait: Gait normal.   Patient has chronic strabismus        Assessment & Plan:  Chest pain, unspecified type - Plan: EKG 12-Lead, pantoprazole  (PROTONIX) 40 MG tablet  Type 1 diabetes mellitus with other specified complication ([place]) - Plan: Hemoglobin A1c, Lipid panel, Protein / Creatinine Ratio, Urine, CANCELED: CBC with Differential/Platelet, CANCELED: COMPLETE METABOLIC PANEL WITH GFR  Bipolar 1 disorder, mixed, severe, with situational stress  Gastroparesis, history  Moderate asthma without complication, unspecified whether persistent I believe the patient's chest pain is either gastrointestinal or possibly stated.  Given her history of gastroparesis and taking Mounjaro I suspect esophagitis due to acid reflux.  I will start her on pantoprazole 40 mg daily.  I do not feel that this is cardiac in nature.  She is currently seeing an endocrinologist who is managing her diabetes.  She is also following up with nephrology for her chronic kidney disease and sees a psychiatrist for her bipolar disorder.  I would like to get some baseline lab work today.  She had a CBC and a CMP drawn within the week at her podiatrist.  CBC and CMP were normal except for creatinine of 1.16.  Liver function test were normal.

## 2022-11-03 LAB — PROTEIN / CREATININE RATIO, URINE
Creatinine, Urine: 214 mg/dL (ref 20–275)
Protein/Creat Ratio: 294 mg/g creat — ABNORMAL HIGH (ref 24–184)
Protein/Creatinine Ratio: 0.294 mg/mg creat — ABNORMAL HIGH (ref 0.024–0.184)
Total Protein, Urine: 63 mg/dL — ABNORMAL HIGH (ref 5–24)

## 2022-11-03 LAB — LIPID PANEL
Cholesterol: 127 mg/dL (ref <200)
HDL: 66 mg/dL (ref >=50)
LDL Cholesterol (Calc): 47 mg/dL (calc)
Non-HDL Cholesterol (Calc): 61 mg/dL (calc) (ref <130)
Total CHOL/HDL Ratio: 1.9 (calc) (ref <5.0)
Triglycerides: 56 mg/dL (ref <150)

## 2022-11-03 LAB — HEMOGLOBIN A1C
Hgb A1c MFr Bld: 9.3 % of total Hgb — ABNORMAL HIGH (ref <5.7)
Mean Plasma Glucose: 220 mg/dL
eAG (mmol/L): 12.2 mmol/L

## 2022-11-06 ENCOUNTER — Telehealth: Payer: Self-pay | Admitting: *Deleted

## 2022-11-06 ENCOUNTER — Other Ambulatory Visit (HOSPITAL_COMMUNITY): Payer: Self-pay

## 2022-11-06 MED ORDER — MOUNJARO 5 MG/0.5ML ~~LOC~~ SOAJ
5.0000 mg | SUBCUTANEOUS | 1 refills | Status: DC
  Filled 2022-11-06: qty 6, 84d supply, fill #0

## 2022-11-06 MED ORDER — LISDEXAMFETAMINE DIMESYLATE 50 MG PO CAPS
50.0000 mg | ORAL_CAPSULE | Freq: Every morning | ORAL | 0 refills | Status: DC
  Filled 2022-11-06 – 2022-11-14 (×2): qty 30, 30d supply, fill #0

## 2022-11-06 MED ORDER — AZITHROMYCIN 250 MG PO TABS
ORAL_TABLET | ORAL | 0 refills | Status: AC
  Filled 2022-11-06: qty 6, 5d supply, fill #0

## 2022-11-07 ENCOUNTER — Telehealth: Payer: Self-pay | Admitting: Obstetrics and Gynecology

## 2022-11-07 NOTE — Telephone Encounter (Signed)
I contacted patient via phone, I left message ABC will be out of office on 11/28/22 afternoon. I offer 2/19 in the AM. I advised patient to call back to rescheduled.

## 2022-11-07 NOTE — Patient Outreach (Signed)
  Care Coordination   11/07/2022 Name: [name] MRN: [id number] DOB: Apr 29, 1986   Care Coordination Outreach Attempts:  An unsuccessful telephone outreach was attempted today to offer the patient information about available care coordination services as a benefit of their health plan.   Follow Up Plan:  Additional outreach attempts will be made to offer the patient care coordination information and services.   Encounter Outcome:  No Answer   Care Coordination Interventions:  No, not indicated    SIG Tom Ragsdale C. Myrtie Neither, MSN, [facility] Gerontological Nurse Practitioner [facility] Care Management [phone]

## 2022-11-09 ENCOUNTER — Ambulatory Visit: Payer: Medicare Other | Admitting: Psychiatry

## 2022-11-10 ENCOUNTER — Other Ambulatory Visit (HOSPITAL_COMMUNITY): Payer: Self-pay

## 2022-11-14 ENCOUNTER — Other Ambulatory Visit (HOSPITAL_COMMUNITY): Payer: Self-pay

## 2022-11-14 ENCOUNTER — Other Ambulatory Visit: Payer: Self-pay

## 2022-11-14 NOTE — Telephone Encounter (Signed)
I contacted patient via phone, patient has been rescheduled

## 2022-11-15 ENCOUNTER — Telehealth: Payer: Self-pay | Admitting: *Deleted

## 2022-11-15 NOTE — Telephone Encounter (Signed)
Patient rescheduled for office visit on 2/13 and annual for 3/12

## 2022-11-15 NOTE — Patient Outreach (Signed)
  Care Coordination   Initial Visit Note   11/15/2022 Name: [name] MRN: [id number] DOB: 1986-02-01  [name] is a 37 y.o. year old female who sees [name], Cristina Mcgee, [name] for primary care. I spoke with  [name] by phone today.  What matters to the patients health and wellness today?  DESCRIBED HEALTH EQUITY PROGRAM AT LENGTH WITH BENEFITS AND PT DECIDED TO OPT OUT.    Goals Addressed   None     SDOH assessments and interventions completed:  No     Care Coordination Interventions:  No, not indicated   Follow up plan: No further intervention required.   Encounter Outcome:  Pt. Refused   [name] C. Myrtie Neither, MSN, [facility] Gerontological Nurse Practitioner [facility] Care Management [phone]

## 2022-11-20 NOTE — Progress Notes (Addendum)
Cristina Frizzle, MD   Chief Complaint  Patient presents with   Vaginal Bleeding    Pt has been spotting since Jan 8, no abnormal pain.    HPI:      [name] is a 37 y.o. G2P1 whose LMP was Patient's last menstrual period was 10/16/2022 (approximate)., presents today for monthly bleeding with IUD since 1/24. Mirena REplaced 12/05/19 after previous one expelled. Was amenorrheic till now; now with spotting for a few days for past 2 cycles. No BTB, no dysmen. She is sexually active, no pain/bleeding, no new partners. Pt has lost 20# recently with mounjaro. No vag sx.  Neg pap/neg HPV DNA 12/05/19, neg pap/POS HPV DNA 2018 with neg colpo (at [facility] per pt); hx of LTO cyst 2021--no pelvic pain currently.  Patient Active Problem List   Diagnosis Date Noted   Overactive bladder 02/07/2022   Recurrent cold sores 02/02/2021   Iron deficiency anemia 12/19/2020   Long term (current) use of insulin ([place]) 12/19/2020   Migraine 11/15/2020   CKD stage 3 secondary to diabetes ([place]) 11/15/2020   Diabetic neuropathy ([place]) 11/15/2020   Type 1 diabetes mellitus with stage 3b chronic kidney disease ([place]) 08/27/2020   Polyphagia 08/27/2020   Bipolar 1 disorder, mixed, severe ([place]) 08/27/2020   Type 1 diabetes mellitus with hyperglycemia ([place]) 08/27/2020   Financial difficulties 08/27/2020   Drug-induced weight gain 08/27/2020   Traction detachment of right retina 08/27/2020   Vitreous hemorrhage of left eye ([place]) 08/27/2020   Hypertension associated with diabetes ([place]) 08/27/2020   Type 1 diabetes mellitus with diabetic polyneuropathy 08/27/2020   Class 1 obesity with serious comorbidity and body mass index (BMI) of 33.0 to 33.9 in adult 08/27/2020   Status post eye surgery 07/01/2020   Chronic migraine without aura 01/22/2020   S/P eye surgery 08/29/2019   Diplopia 07/23/2019   Epiretinal membrane (ERM) of left eye 07/23/2019   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/2019   Pseudophakia of left eye 11/04/2018   Chronic kidney disease, stage 3b ([place]) 01/01/2017   Diabetic nephropathy associated with type 1 diabetes mellitus ([place]) 03/01/2016   After-cataract obscuring vision, left 09/22/2014   Drug overdose, intentional ([place]) 03/27/2014   Asthma 11/10/2013   MDD (major depressive disorder), recurrent episode, severe ([place]) 09/26/2013   Generalized anxiety disorder 09/26/2013   Aphakia, right eye 09/14/2012   Retinal detachment, tractional, left eye 06/11/2012   Eczema 06/07/2012   Diabetic retinopathy associated with type 1 diabetes mellitus ([place]) 04/30/2012   H/O insertion of insulin pump 04/30/2012   History of atrial tachycardia 08/08/2010   Gastroparesis due to DM ([place]) 08/08/2010    Past Surgical History:  Procedure Laterality Date   ANAL RECTAL MANOMETRY N/A 03/25/2018   Procedure: ANO RECTAL MANOMETRY;  Surgeon: Doran Stabler, MD;  Location: [facility];  Service: Gastroenterology;  Laterality: N/A;   CARPAL TUNNEL RELEASE Left 10/16/2019   Procedure: LEFT CARPAL TUNNEL RELEASE;  Surgeon: Carole Civil, MD;  Location: [facility];  Service: Orthopedics;  Laterality: Left;   CESAREAN SECTION     x 2   CHOLECYSTECTOMY     EYE SURGERY     REFRACTIVE SURGERY Bilateral     Family History  Problem Relation Age of Onset   Diabetes Mother        type 2   Hypertension Mother    Obesity Mother    Diabetes Father  Hypertension Father    High Cholesterol Father    Sleep apnea Father    Obesity Father    Heart disease Father    Heart attack Father    Diabetes Brother        type 1   Renal Disease Brother        renal failure   Hypertension Brother    Hypertension Maternal Grandmother    Breast cancer Maternal Aunt 64   Colon cancer Maternal Aunt    Uterine cancer Maternal Aunt 65   Breast cancer Other    Rectal cancer Neg Hx    Esophageal cancer Neg Hx     Social History   Socioeconomic  History   Marital status: Married    Spouse name: Not on file   Number of children: 2   Years of education: 12   Highest education level: 12th grade  Occupational History   Occupation: Materials engineer  Tobacco Use   Smoking status: Never   Smokeless tobacco: Never  Vaping Use   Vaping Use: Never used  Substance and Sexual Activity   Alcohol use: No   Drug use: No   Sexual activity: Yes    Partners: Male    Birth control/protection: I.U.D.    Comment: Mirena  Other Topics Concern   Not on file  Social History Narrative   Pt has a daughter and boyfriend.    Dad passed away suddenly heart attack 12/2019   Social Determinants of Health   Financial Resource Strain: High Risk (08/24/2020)   Overall Financial Resource Strain (CARDIA)    Difficulty of Paying Living Expenses: Very hard  Food Insecurity: Food Insecurity Present (08/24/2020)   Hunger Vital Sign    Worried About Running Out of Food in the Last Year: Sometimes true    Ran Out of Food in the Last Year: Sometimes true  Transportation Needs: No Transportation Needs (08/24/2020)   PRAPARE - Hydrologist (Medical): No    Lack of Transportation (Non-Medical): No  Physical Activity: Inactive (08/24/2020)   Exercise Vital Sign    Days of Exercise per Week: 0 days    Minutes of Exercise per Session: 0 min  Stress: Stress Concern Present (08/24/2020)   [place]    Feeling of Stress : Very much  Social Connections: Moderately Integrated (08/24/2020)   Social Connection and Isolation Panel [NHANES]    Frequency of Communication with Friends and Family: More than three times a week    Frequency of Social Gatherings with Friends and Family: More than three times a week    Attends Religious Services: More than 4 times per year    Active Member of [employer] or Organizations: No    Attends Archivist Meetings: Never    Marital  Status: Living with partner  Intimate Partner Violence: Not At Risk (08/24/2020)   Humiliation, Afraid, Rape, and Kick questionnaire    Fear of Current or Ex-Partner: No    Emotionally Abused: No    Physically Abused: No    Sexually Abused: No    Outpatient Medications Prior to Visit  Medication Sig Dispense Refill   albuterol (PROVENTIL) (2.5 MG/3ML) 0.083% nebulizer solution Take 3 mLs (2.5 mg total) by nebulization every 6 (six) hours as needed for wheezing or shortness of breath. 150 mL 1   albuterol (VENTOLIN HFA) 108 (90 Base) MCG/ACT inhaler TAKE 2 PUFFS BY MOUTH EVERY 6 HOURS AS NEEDED  FOR WHEEZE OR SHORTNESS OF BREATH 18 each 2   ammonium lactate (AMLACTIN) 12 % lotion Apply 1 Application topically as needed for dry skin. 400 g 0   Budeson-Glycopyrrol-Formoterol (BREZTRI AEROSPHERE) 160-9-4.8 MCG/ACT AERO Inhale 2 puffs into the lungs 2 (two) times daily.     Cariprazine HCl (VRAYLAR) 6 MG CAPS Take 1 capsule (6 mg total) by mouth daily. 30 capsule 1   carvedilol (COREG) 6.25 MG tablet Take 6.25 mg by mouth 2 (two) times daily.     clonazePAM (KLONOPIN) 0.5 MG tablet Take 1 tablet (0.5 mg total) by mouth every 8 (eight) hours as needed. for anxiety 20 tablet 0   FASENRA 30 MG/ML SOSY Inject into the skin.     fluticasone (FLONASE) 50 MCG/ACT nasal spray Place 2 sprays into both nostrils daily. (Patient taking differently: Place 2 sprays into both nostrils daily as needed (seasonal allergies.).) 16 g 2   furosemide (LASIX) 40 MG tablet Take 40 mg by mouth every other day.      GLUCAGON EMERGENCY 1 MG injection Inject 1 mg into the skin as needed (hypoglycemia.).      Insulin Human (INSULIN PUMP) SOLN Inject 1 each into the skin 3 times daily with meals, bedtime and 2 AM. Novolog: 36-38 units per day     Insulin Pen Needle 32G X 4 MM MISC 1 each by Does not apply route once a week. 50 each 0   levonorgestrel (MIRENA, 52 MG,) 20 MCG/24HR IUD 1 each by Intrauterine route once.      lisdexamfetamine (VYVANSE) 50 MG capsule Take 1 capsule (50 mg total) by mouth in the morning. 30 capsule 0   losartan (COZAAR) 50 MG tablet Take 50 mg by mouth daily.     montelukast (SINGULAIR) 10 MG tablet Take 1 tablet (10 mg total) by mouth at bedtime. 90 tablet 1   NOVOLOG 100 UNIT/ML injection Inject 36-38 Units into the skin daily. Via insulin pump per sliding scale     pantoprazole (PROTONIX) 40 MG tablet Take 1 tablet (40 mg total) by mouth daily. 30 tablet 3   pravastatin (PRAVACHOL) 20 MG tablet SMARTSIG:1 Tablet(s) By Mouth Every Evening     promethazine (PHENERGAN) 25 MG tablet Take 1 tablet (25 mg total) by mouth every 8 (eight) hours as needed for nausea or vomiting. 30 tablet 0   terbinafine (LAMISIL) 250 MG tablet Take 1 tablet (250 mg total) by mouth daily. 90 tablet 0   tirzepatide (MOUNJARO) 5 MG/0.5ML Pen Inject 5 mg into the skin once a week. 6 mL 1   tiZANidine (ZANAFLEX) 4 MG tablet Take 4 mg by mouth 2 (two) times daily as needed for muscle spasms.      Ubrogepant (UBRELVY) 100 MG TABS Take 1 tablet (100 mg total) by mouth daily as needed (migraine headaches.). 30 tablet 3   mupirocin ointment (BACTROBAN) 2 % Apply 1 Application topically 2 (two) times daily. 30 g 2   tirzepatide (MOUNJARO) 2.5 MG/0.5ML Pen Inject 2.5 mg into the skin once a week. 2 mL 0   No facility-administered medications prior to visit.      ROS:  Review of Systems  Constitutional:  Negative for fever.  Gastrointestinal:  Negative for blood in stool, constipation, diarrhea, nausea and vomiting.  Genitourinary:  Positive for vaginal bleeding. Negative for dyspareunia, dysuria, flank pain, frequency, hematuria, urgency, vaginal discharge and vaginal pain.  Musculoskeletal:  Negative for back pain.  Skin:  Negative for rash.   BREAST: No  symptoms   OBJECTIVE:   Vitals:  BP 104/60   Ht 5' 6"$  (1.676 m)   Wt 204 lb (92.5 kg)   LMP 10/16/2022 (Approximate)   BMI 32.93 kg/m   Physical  Exam Vitals reviewed.  Constitutional:      Appearance: She is well-developed.  Pulmonary:     Effort: Pulmonary effort is normal.  Genitourinary:    General: Normal vulva.     Pubic Area: No rash.      Labia:        Right: No rash, tenderness or lesion.        Left: No rash, tenderness or lesion.      Vagina: Normal. No vaginal discharge, erythema or tenderness.     Cervix: Normal.     Uterus: Normal. Not enlarged and not tender.      Adnexa: Right adnexa normal and left adnexa normal.       Right: No mass or tenderness.         Left: No mass or tenderness.       Comments: IUD STRINGS NOT IN CX OS Musculoskeletal:        General: Normal range of motion.     Cervical back: Normal range of motion.  Skin:    General: Skin is warm and dry.  Neurological:     General: No focal deficit present.     Mental Status: She is alert and oriented to person, place, and time.  Psychiatric:        Mood and Affect: Mood normal.        Behavior: Behavior normal.        Thought Content: Thought content normal.        Judgment: Judgment normal.     Results: Results for orders placed or performed in visit on 11/21/22 (from the past 24 hour(s))  POCT urine pregnancy     Status: Normal   Collection Time: 11/21/22 10:10 AM  Result Value Ref Range   Preg Test, Ur Negative Negative     Assessment/Plan: Breakthrough bleeding with IUD - Plan: POCT urine pregnancy, US PELVIC COMPLETE WITH TRANSVAGINAL; for 2 months,  neg UPT, check pap. Most likely normal menses. Check GYN u/s due to lost IUD strings.   Encounter for routine checking of intrauterine contraceptive device (IUD) - Plan: US PELVIC COMPLETE WITH TRANSVAGINAL  Intrauterine contraceptive device threads lost, initial encounter - Plan: US PELVIC COMPLETE WITH TRANSVAGINAL; check placement with GYN u/s. Will f/u with results. Condoms in meantime.   Cervical cancer screening - Plan: Cytology - PAP  Screening for HPV (human  papillomavirus) - Plan: Cytology - PAP    Return if symptoms worsen or fail to improve.  Bhavesh Vazquez B. Joseangel Nettleton, PA-C 11/21/2022 12:30 PM

## 2022-11-21 ENCOUNTER — Ambulatory Visit (INDEPENDENT_AMBULATORY_CARE_PROVIDER_SITE_OTHER): Payer: Medicare Other | Admitting: Obstetrics and Gynecology

## 2022-11-21 ENCOUNTER — Encounter: Payer: Self-pay | Admitting: Obstetrics and Gynecology

## 2022-11-21 ENCOUNTER — Other Ambulatory Visit (HOSPITAL_COMMUNITY)
Admission: RE | Admit: 2022-11-21 | Discharge: 2022-11-21 | Disposition: A | Discharge status: Home / Self Care | Payer: Medicare Other | Source: Ambulatory Visit | Attending: Obstetrics and Gynecology | Admitting: Obstetrics and Gynecology

## 2022-11-21 VITALS — BP 104/60 | Ht 66.0 in | Wt 204.0 lb

## 2022-11-21 DIAGNOSIS — Z1151 Encounter for screening for human papillomavirus (HPV): Secondary | ICD-10-CM | POA: Insufficient documentation

## 2022-11-21 DIAGNOSIS — Z30431 Encounter for routine checking of intrauterine contraceptive device: Secondary | ICD-10-CM

## 2022-11-21 DIAGNOSIS — Z01419 Encounter for gynecological examination (general) (routine) without abnormal findings: Secondary | ICD-10-CM | POA: Insufficient documentation

## 2022-11-21 DIAGNOSIS — T8332XA Displacement of intrauterine contraceptive device, initial encounter: Secondary | ICD-10-CM

## 2022-11-21 DIAGNOSIS — N921 Excessive and frequent menstruation with irregular cycle: Secondary | ICD-10-CM

## 2022-11-21 DIAGNOSIS — Z3202 Encounter for pregnancy test, result negative: Secondary | ICD-10-CM | POA: Diagnosis not present

## 2022-11-21 DIAGNOSIS — Z124 Encounter for screening for malignant neoplasm of cervix: Secondary | ICD-10-CM

## 2022-11-21 LAB — POCT URINE PREGNANCY: Preg Test, Ur: NEGATIVE

## 2022-11-22 ENCOUNTER — Telehealth: Payer: Self-pay

## 2022-11-22 ENCOUNTER — Ambulatory Visit (INDEPENDENT_AMBULATORY_CARE_PROVIDER_SITE_OTHER): Payer: Medicare Other | Admitting: Licensed Clinical Social Worker

## 2022-11-22 DIAGNOSIS — F411 Generalized anxiety disorder: Secondary | ICD-10-CM | POA: Diagnosis not present

## 2022-11-22 DIAGNOSIS — F3162 Bipolar disorder, current episode mixed, moderate: Secondary | ICD-10-CM

## 2022-11-22 NOTE — Telephone Encounter (Signed)
PA FOR UBRELVY:  [name] (Key: [id number])  Your information has been submitted to [place] [employer] Part D. [employer] D will review the request and will issue a decision, typically within 1-3 days from your submission. You can check the updated outcome later by reopening this request.  If [employer] has not responded in 1-3 days or if you have any questions about your ePA request, please contact [place] Medicare Part D at [phone]. If you think there may be a problem with your PA request, use our live chat feature at the bottom right.

## 2022-11-23 NOTE — Telephone Encounter (Signed)
Fax received 11/23/22-Ubrelvy has been approved from 10/09/2022-11/22/2023. Pt advised. Mjp,lpn

## 2022-11-23 NOTE — Progress Notes (Signed)
THERAPIST PROGRESS NOTE  Session Time: [place] in office visit for patient and LCSW clinician  Pt provided verbal consent for Katie Bounds, LCSW (observing clinician) to be present throughout session.   Participation Level: Active  Behavioral Response: NeatAlertAnxious and Depressed  Type of Therapy: Individual Therapy  Treatment Goals addressed:     LTG: [place] will stabilize mood and increase goal-directed behavior as evidenced by pt self report 3 out of 5 sessions documented.   STG: [place] will identify cognitive patterns and beliefs that interfere with therapy as evidenced by pt self report 3 out of 5 sessions documented  ProgressTowards Goals: Progressing  Interventions: CBT, DBT, and Family Systems  Summary: [name] is a 37 y.o. female who presents with improving symptoms related to bipolar disorder.   Allowed pt to explore and express thoughts and feelings associated with recent life situations and external stressors.  Patient reports that she is continuing to experience stress associated with her ongoing marriage conflict.  Patient reports that her husband recently lost his job, and is currently not working.  Patient is hopeful that he will get a job with someone he knows through the state/prison system.  Patient reports that she feels that her husband is hopeful about it, and made the statement that this is his new career.  Patient states that they have been behind financially on bills and rent.  Allowed patient to identify ways that could be helpful as far as saving money.  Patient reports that she has her own income, but currently that is all.  Allowed patient to focus on problem solving and coping skills to assist with managing financial distress.  Patient reports that she is currently taking Vyvanse to help manage ADHD symptoms, and has also lost 20 pounds after taking Montjairo.  Patient reports that she feels better  about herself, and less depressed.  Patient reports that she has a personal goal of joining the gym, and that her mother will be her accountability person.  Encouraged patient to continue positive behavioral activation.  Patient reports that she recently had teeth removed, and got new teeth--patient states that she feels much better about herself and her appearance after this.  Continued recommendations are as follows: self care behaviors, positive social engagements, focusing on overall work/home/life balance, and focusing on positive physical and emotional wellness.   Suicidal/Homicidal: No  Therapist Response: Reyanna is continuing to work hard to use coping skills to manage her symptoms. Pt is still working hard to focus on overall wellness and weight loss. Pt feels well supported by her medical and psychiatric physicians. Pt is able to identify triggers and able to sustain gains/progress.   Patient is demonstrating significant gains in self-assertion and positive self promotion.  Plan: Return again in 4 weeks.  Diagnosis:  Encounter Diagnoses  Name Primary?   Bipolar 1 disorder, mixed, moderate (HCC) Yes   Generalized anxiety disorder     Collaboration of Care: Other pt encouraged to continue care with psychiatrist of record, Dr. Clovis Pu  Patient/Guardian was advised Release of Information must be obtained prior to any record release in order to collaborate their care with an outside provider. Patient/Guardian was advised if they have not already done so to contact the registration department to sign all necessary forms in order for [name] to release information regarding their care.   Consent: Patient/Guardian gives verbal consent for treatment and assignment of benefits for services provided during this visit. Patient/Guardian expressed understanding and agreed  to proceed.   Active     Anxiety     LTG: Reduce overall frequency, intensity, and duration of the anxiety so that daily  functioning is not impaired per pt self report 3 out of 5 sessions documented.       Start:  11/23/22    Expected End:  06/07/23         STG: Learn and implement coping skills that result in a reduction of anxiety and worry, and improve daily functioning per pt report 3 out of 5 sessions documented      Start:  11/23/22    Expected End:  06/07/23           BH CCP BIPOLAR DISORDER-MANIA/HYPOMANIA     LTG: Alleviate depressive/manic symptoms and return to improved levels of effective functioning per pt self report 3 out of 5 sessions documented.       Start:  11/23/22    Expected End:  06/07/23         STG: Develop healthy interpersonal relationships that lead to improvement in depression symptoms  as evidenced by pt self report 3 out of 5 sessions documented.      Start:  11/23/22    Expected End:  06/07/23           OP Depression     LTG: Decrease depressive symptoms and improve levels of effective functioning-pt reports a decrease in overall depression symptoms 3 out of 5 sessions documented.      Start:  11/23/22    Expected End:  06/07/23         STG: Develop healthy thinking patterns and beliefs about self, others, and the world that lead to the alleviation and help prevent the relapse of depression per self report 3 out of 5 sessions documented.       Start:  11/23/22    Expected End:  06/07/23            Perla, LCSW 06/29/2022

## 2022-11-24 ENCOUNTER — Other Ambulatory Visit (HOSPITAL_COMMUNITY): Payer: Self-pay

## 2022-11-24 LAB — CYTOLOGY - PAP
Comment: NEGATIVE
Diagnosis: UNDETERMINED — AB
High risk HPV: POSITIVE — AB

## 2022-11-27 ENCOUNTER — Telehealth: Payer: Self-pay

## 2022-11-27 ENCOUNTER — Other Ambulatory Visit (HOSPITAL_COMMUNITY): Payer: Self-pay

## 2022-11-27 MED ORDER — LISDEXAMFETAMINE DIMESYLATE 50 MG PO CAPS
50.0000 mg | ORAL_CAPSULE | Freq: Every morning | ORAL | 0 refills | Status: DC
  Filled 2022-11-27: qty 30, 30d supply, fill #0

## 2022-11-27 NOTE — Telephone Encounter (Signed)
Done. Pt wanted in file she had abn pap in 2018 at [facility]. Results were neg cells/POS HPV DNA. Pt states she had neg "scraping". Guidelines still suggest repeat pap in 1 yr.

## 2022-11-27 NOTE — Telephone Encounter (Signed)
Patient contacted office requesting a call back from [place] to discuss results of abnormal pap smear. KW

## 2022-11-28 ENCOUNTER — Ambulatory Visit: Payer: Medicare Other | Admitting: Obstetrics and Gynecology

## 2022-11-29 ENCOUNTER — Other Ambulatory Visit (HOSPITAL_COMMUNITY): Payer: Self-pay

## 2022-11-30 ENCOUNTER — Other Ambulatory Visit (HOSPITAL_COMMUNITY): Payer: Self-pay

## 2022-11-30 ENCOUNTER — Ambulatory Visit
Admission: RE | Admit: 2022-11-30 | Discharge: 2022-11-30 | Disposition: A | Discharge status: Home / Self Care | Payer: Medicare Other | Source: Ambulatory Visit | Attending: Obstetrics and Gynecology | Admitting: Obstetrics and Gynecology

## 2022-11-30 DIAGNOSIS — Z30431 Encounter for routine checking of intrauterine contraceptive device: Secondary | ICD-10-CM | POA: Diagnosis not present

## 2022-11-30 DIAGNOSIS — N921 Excessive and frequent menstruation with irregular cycle: Secondary | ICD-10-CM | POA: Insufficient documentation

## 2022-11-30 DIAGNOSIS — T8332XA Displacement of intrauterine contraceptive device, initial encounter: Secondary | ICD-10-CM | POA: Insufficient documentation

## 2022-11-30 DIAGNOSIS — Z975 Presence of (intrauterine) contraceptive device: Secondary | ICD-10-CM | POA: Diagnosis not present

## 2022-11-30 MED ORDER — LISDEXAMFETAMINE DIMESYLATE 60 MG PO CAPS
60.0000 mg | ORAL_CAPSULE | Freq: Every day | ORAL | 0 refills | Status: DC
  Filled 2022-11-30: qty 30, 30d supply, fill #0

## 2022-12-01 ENCOUNTER — Other Ambulatory Visit (HOSPITAL_COMMUNITY): Payer: Self-pay

## 2022-12-07 ENCOUNTER — Encounter: Payer: Self-pay | Admitting: Radiology

## 2022-12-07 ENCOUNTER — Ambulatory Visit (INDEPENDENT_AMBULATORY_CARE_PROVIDER_SITE_OTHER): Payer: Medicare Other

## 2022-12-07 VITALS — BP 128/72 | Ht 66.0 in | Wt 204.2 lb

## 2022-12-07 DIAGNOSIS — Z0000 Encounter for general adult medical examination without abnormal findings: Secondary | ICD-10-CM

## 2022-12-07 NOTE — Progress Notes (Signed)
Subjective:   [name] is a 37 y.o. female who presents for Medicare Annual (Subsequent) preventive examination.  Review of Systems     Cardiac Risk Factors include: diabetes mellitus;dyslipidemia;hypertension;sedentary lifestyle     Objective:    Today's Vitals   12/07/22 1054  BP: 128/72  Weight: 204 lb 3.2 oz (92.6 kg)  Height: '5\' 6"'$  (1.676 m)   Body mass index is 32.96 kg/m.     12/07/2022   10:55 AM 08/24/2020    2:58 PM 10/16/2019    7:13 AM 10/14/2019    9:26 AM 09/09/2019   10:05 AM 08/28/2019   12:07 PM 06/02/2019   10:48 AM  Advanced Directives  Does Patient Have a Medical Advance Directive? No No No No No No No  Would patient like information on creating a medical advance directive? Yes (MAU/Ambulatory/Procedural Areas - Information given) No - Patient declined No - Patient declined No - Patient declined No - Patient declined No - Patient declined No - Patient declined    Current Medications (verified) Outpatient Encounter Medications as of 12/07/2022  Medication Sig   albuterol (PROVENTIL) (2.5 MG/3ML) 0.083% nebulizer solution Take 3 mLs (2.5 mg total) by nebulization every 6 (six) hours as needed for wheezing or shortness of breath.   albuterol (VENTOLIN HFA) 108 (90 Base) MCG/ACT inhaler TAKE 2 PUFFS BY MOUTH EVERY 6 HOURS AS NEEDED FOR WHEEZE OR SHORTNESS OF BREATH   ammonium lactate (AMLACTIN) 12 % lotion Apply 1 Application topically as needed for dry skin.   Budeson-Glycopyrrol-Formoterol (BREZTRI AEROSPHERE) 160-9-4.8 MCG/ACT AERO Inhale 2 puffs into the lungs 2 (two) times daily.   Cariprazine HCl (VRAYLAR) 6 MG CAPS Take 1 capsule (6 mg total) by mouth daily.   carvedilol (COREG) 6.25 MG tablet Take 6.25 mg by mouth 2 (two) times daily.   clonazePAM (KLONOPIN) 0.5 MG tablet Take 1 tablet (0.5 mg total) by mouth every 8 (eight) hours as needed. for anxiety   FASENRA 30 MG/ML SOSY Inject into the skin.   fluticasone (FLONASE) 50 MCG/ACT nasal  spray Place 2 sprays into both nostrils daily. (Patient taking differently: Place 2 sprays into both nostrils daily as needed (seasonal allergies.).)   furosemide (LASIX) 40 MG tablet Take 40 mg by mouth every other day.    GLUCAGON EMERGENCY 1 MG injection Inject 1 mg into the skin as needed (hypoglycemia.).    Insulin Human (INSULIN PUMP) SOLN Inject 1 each into the skin 3 times daily with meals, bedtime and 2 AM. Novolog: 36-38 units per day   Insulin Pen Needle 32G X 4 MM MISC 1 each by Does not apply route once a week.   levonorgestrel (MIRENA, 52 MG,) 20 MCG/24HR IUD 1 each by Intrauterine route once.   lisdexamfetamine (VYVANSE) 60 MG capsule Take 1 capsule (60 mg total) by mouth daily.   losartan (COZAAR) 50 MG tablet Take 50 mg by mouth daily.   montelukast (SINGULAIR) 10 MG tablet Take 1 tablet (10 mg total) by mouth at bedtime.   NOVOLOG 100 UNIT/ML injection Inject 36-38 Units into the skin daily. Via insulin pump per sliding scale   pantoprazole (PROTONIX) 40 MG tablet Take 1 tablet (40 mg total) by mouth daily.   pravastatin (PRAVACHOL) 20 MG tablet SMARTSIG:1 Tablet(s) By Mouth Every Evening   promethazine (PHENERGAN) 25 MG tablet Take 1 tablet (25 mg total) by mouth every 8 (eight) hours as needed for nausea or vomiting.   terbinafine (LAMISIL) 250 MG tablet Take 1 tablet (  250 mg total) by mouth daily.   tirzepatide [facility]) 5 MG/0.5ML Pen Inject 5 mg into the skin once a week.   tiZANidine (ZANAFLEX) 4 MG tablet Take 4 mg by mouth 2 (two) times daily as needed for muscle spasms.    Ubrogepant (UBRELVY) 100 MG TABS Take 1 tablet (100 mg total) by mouth daily as needed (migraine headaches.).   No facility-administered encounter medications on file as of 12/07/2022.    Allergies (verified) Nsaids, Other, Toradol [ketorolac tromethamine], and Ceftin   History: Past Medical History:  Diagnosis Date   Anemia    Anemia of chronic disease 06/07/2012   Formatting of this note  might be different from the original. Hematology consultation 07-13-16 (see note)--> Anemia of chronic disease Required 3 transfusions during pregnancy  Last Assessment & Plan:  Formatting of this note might be different from the original. She has now required 3 blood transfusions this pregnancy for her anemia of chronic disease. The most recent was one week ago when she bec   Anxiety    Asthma    Asthma 11/10/2013   Back pain    Bipolar 1 disorder (HCC)    Chest pain    Chronic renal failure syndrome, stage 3 (moderate) (HCC)    Colles' fracture of left radius [id number]   Complication of anesthesia    Constipation    Depression    Depression    Diabetic gastroparesis (HCC)    Diabetic neuropathy, type I diabetes mellitus ([place])    Diabetic ulcer of right great toe ([place]) 08/27/2020   Edema, lower extremity    Gallbladder disease    Gastroparesis    GERD (gastroesophageal reflux disease)    Headache(784.0)    Hypertension    Joint pain    Kidney stones    Palpitations    Polyneuropathy in diabetes(357.2)    PONV (postoperative nausea and vomiting)    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[place])    S/P carpal tunnel release left 10/16/19 11/04/2019   Shortness of breath    Tachycardia    baseline tachycardia    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)    Past Surgical History:  Procedure Laterality Date   ANAL RECTAL MANOMETRY N/A 03/25/2018   Procedure: ANO RECTAL MANOMETRY;  Surgeon: Doran Stabler, MD;  Location: [facility];  Service: Gastroenterology;  Laterality: N/A;   CARPAL TUNNEL RELEASE Left 10/16/2019   Procedure: LEFT CARPAL TUNNEL RELEASE;  Surgeon: Carole Civil, MD;  Location: [facility];  Service: Orthopedics;  Laterality: Left;   CESAREAN SECTION     x 2   CHOLECYSTECTOMY     EYE SURGERY     REFRACTIVE SURGERY Bilateral    Family History  Problem Relation Age of Onset   Diabetes Mother        type 2   Hypertension Mother     Obesity Mother    Diabetes Father    Hypertension Father    High Cholesterol Father    Sleep apnea Father    Obesity Father    Heart disease Father    Heart attack Father    Diabetes Brother        type 1   Renal Disease Brother        renal failure   Hypertension Brother    Hypertension Maternal Grandmother    Breast cancer Maternal Aunt 64   Colon cancer Maternal Aunt    Uterine cancer Maternal Aunt 65  Breast cancer Other    Rectal cancer Neg Hx    Esophageal cancer Neg Hx    Social History   Socioeconomic History   Marital status: Married    Spouse name: Not on file   Number of children: 2   Years of education: 12   Highest education level: 12th grade  Occupational History   Occupation: Materials engineer  Tobacco Use   Smoking status: Never   Smokeless tobacco: Never  Vaping Use   Vaping Use: Never used  Substance and Sexual Activity   Alcohol use: No   Drug use: No   Sexual activity: Yes    Partners: Male    Birth control/protection: I.U.D.    Comment: Mirena  Other Topics Concern   Not on file  Social History Narrative   Pt has a daughter and boyfriend.    Dad passed away suddenly heart attack 12/2019   Social Determinants of Health   Financial Resource Strain: Medium Risk (12/07/2022)   Overall Financial Resource Strain (CARDIA)    Difficulty of Paying Living Expenses: Somewhat hard  Food Insecurity: Food Insecurity Present (12/07/2022)   Hunger Vital Sign    Worried About Running Out of Food in the Last Year: Sometimes true    Ran Out of Food in the Last Year: Sometimes true  Transportation Needs: No Transportation Needs (12/07/2022)   PRAPARE - Hydrologist (Medical): No    Lack of Transportation (Non-Medical): No  Physical Activity: Insufficiently Active (12/07/2022)   Exercise Vital Sign    Days of Exercise per Week: 2 days    Minutes of Exercise per Session: 30 min  Stress: No Stress Concern Present (12/07/2022)    [place]    Feeling of Stress : Only a little  Social Connections: Moderately Integrated (12/07/2022)   Social Connection and Isolation Panel [NHANES]    Frequency of Communication with Friends and Family: More than three times a week    Frequency of Social Gatherings with Friends and Family: Once a week    Attends Religious Services: More than 4 times per year    Active Member of [employer] or Organizations: No    Attends Music therapist: Never    Marital Status: Married    Tobacco Counseling Counseling given: Not Answered   Clinical Intake:  Pre-visit preparation completed: Yes  Pain : No/denies pain  Diabetes: No  How often do you need to have someone help you when you read instructions, pamphlets, or other written materials from your doctor or pharmacy?: 1 - Never  Diabetic?Yes   Nutrition Risk Assessment:  Has the patient had any N/V/D within the last 2 months?  No  Does the patient have any non-healing wounds?  No  Has the patient had any unintentional weight loss or weight gain?  No   Diabetes:  Is the patient diabetic?  Yes  If diabetic, was a CBG obtained today?  No  Did the patient bring in their glucometer from home?  No  How often do you monitor your CBG's? Daily (has insulin pump) .   Financial Strains and Diabetes Management:  Are you having any financial strains with the device, your supplies or your medication? No .  Does the patient want to be seen by Chronic Care Management for management of their diabetes?  No  Would the patient like to be referred to a Nutritionist or for Diabetic Management?  No  Diabetic Exams:  Diabetic Eye Exam: Completed requesting records  Diabetic Foot Exam: Completed 09/25/22   Interpreter Needed?: No  Information entered by :: Denman George LPN   Activities of Daily Living    12/07/2022   12:43 PM 12/06/2022    4:30 PM  In your  present state of health, do you have any difficulty performing the following activities:  Hearing? 0 0  Vision? 0 0  Difficulty concentrating or making decisions? 0 0  Walking or climbing stairs? 0 0  Dressing or bathing? 0 0  Doing errands, shopping? 0 0  Preparing Food and eating ? N N  Using the Toilet? N N  In the past six months, have you accidently leaked urine? Y Y  Do you have problems with loss of bowel control? N N  Managing your Medications? N N  Managing your Finances? N N  Housekeeping or managing your Housekeeping? N N    Patient Care Team: [name], MD as PCP - General (Family Medicine) Janna Arch, MD (Ophthalmology) Koren Bound, PA Copland, Deirdre Evener, PA-C as Referring Physician (Obstetrics and Gynecology) Trula Almer [place], DPM as Consulting Physician (Podiatry) Cottle, Billey Co., MD as Attending Physician (Psychiatry) Hussami, [name], LCSW as Social Worker (Licensed Clinical Social Worker) Siler, Judithe Modest, AGNP-C as Nurse Practitioner (Adult Health Nurse Practitioner) Delrae Rend, MD as Consulting Physician (Endocrinology)  Indicate any recent Medical Services you may have received from other than [facility] providers in the past year (date may be approximate).     Assessment:   This is a routine wellness examination for Natane.  Hearing/Vision screen Hearing Screening - Comments:: Denies hearing difficulties  Vision Screening - Comments::  up to date with routine eye exams with Dr. Helane Rima    Dietary issues and exercise activities discussed: Current Exercise Habits: Home exercise routine, Type of exercise: walking, Time (Minutes): 30, Frequency (Times/Week): 3, Weekly Exercise (Minutes/Week): 90, Intensity: Mild   Goals Addressed             This Visit's Progress    Remain active and continue to work on improving health         Depression Screen    12/07/2022   12:38 PM 11/02/2022    9:23 AM 09/20/2022    1:16 PM  05/19/2022    7:54 AM 06/08/2021    2:53 PM 02/11/2021    5:33 PM 08/24/2020    2:52 PM  PHQ 2/9 Scores  PHQ - 2 Score 0 0     1  PHQ- 9 Score 5           Information is confidential and restricted. Go to Review Flowsheets to unlock data.    Fall Risk    12/07/2022   10:54 AM 12/06/2022    4:30 PM 11/02/2022    8:11 AM 08/24/2020    3:00 PM 05/26/2015    2:18 PM  Fall Risk   Falls in the past year? 0 0 0 0 No  Number falls in past yr: 0 0 0 0   Injury with Fall? 0 0 0 0   Risk for fall due to : No Fall Risks  No Fall Risks No Fall Risks   Follow up Falls prevention discussed  Falls prevention discussed Education provided;Falls prevention discussed     FALL RISK PREVENTION PERTAINING TO THE HOME:  Any stairs in or around the home? No  If so, are there any without handrails? No  Home free of  loose throw rugs in walkways, pet beds, electrical cords, etc? Yes  Adequate lighting in your home to reduce risk of falls? Yes   ASSISTIVE DEVICES UTILIZED TO PREVENT FALLS:  Life alert? No  Use of a cane, walker or w/c? No  Grab bars in the bathroom? No  Shower chair or bench in shower? No  Elevated toilet seat or a handicapped toilet? No   TIMED UP AND GO:  Was the test performed? Yes .  Length of time to ambulate 10 feet: 5 sec.   Gait steady and fast without use of assistive device  Cognitive Function:        12/07/2022   12:44 PM  6CIT Screen  What Year? 0 points  What month? 0 points  What time? 0 points  Count back from 20 0 points  Months in reverse 0 points  Repeat phrase 0 points  Total Score 0 points    Immunizations Immunization History  Administered Date(s) Administered   DTaP 03/05/1985, 05/18/1985, 07/14/1985, 02/15/1987, 03/07/1990   Hepatitis B 07/30/1996, 08/27/1996, 01/28/1997   Hepatitis B, ADULT 07/30/1996, 08/27/1996, 01/28/1997   IPV 03/05/1985, 05/14/1985, 02/15/1987, 03/07/1990   Influenza Split 06/09/2010, 07/02/2013, 07/13/2014, 06/09/2016    Influenza, High Dose Seasonal PF 06/30/2011, 07/16/2012, 08/25/2015, 06/25/2016, 07/31/2017, 09/22/2019   Influenza,inj,Quad PF,6+ Mos 06/30/2011, 07/16/2012, 08/25/2015, 06/25/2016, 07/31/2017, 09/22/2019   Influenza-Unspecified 07/05/2016, 07/28/2017, 05/23/2018   MMR 02/23/1986, 06/03/2011, 08/10/2016   Pneumococcal Conjugate-13 09/13/2017   Pneumococcal Polysaccharide-23 10/09/2005, 06/03/2011, 08/10/2016   Td 06/16/1997   Tdap 02/18/2009, 07/07/2016    TDAP status: Up to date  Flu Vaccine status: Declined, Education has been provided regarding the importance of this vaccine but patient still declined. Advised may receive this vaccine at local pharmacy or Health Dept. Aware to provide a copy of the vaccination record if obtained from local pharmacy or Health Dept. Verbalized acceptance and understanding.  Pneumococcal vaccine status: Up to date  Covid-19 vaccine status: Information provided on how to obtain vaccines.   Qualifies for Shingles Vaccine? No     Screening Tests Health Maintenance  Topic Date Due   COVID-19 Vaccine (1) 12/15/2022 (Originally 12/12/1989)   INFLUENZA VACCINE  01/07/2023 (Originally 05/09/2022)   OPHTHALMOLOGY EXAM  11/05/2023 (Originally 07/22/2020)   HEMOGLOBIN A1C  05/03/2023   FOOT EXAM  09/26/2023   Diabetic kidney evaluation - eGFR measurement  10/20/2023   Diabetic kidney evaluation - Urine ACR  11/03/2023   Medicare Annual Wellness (AWV)  12/07/2023   PAP SMEAR-Modifier  11/21/2025   DTaP/Tdap/Td (9 - Td or Tdap) 07/07/2026   Hepatitis C Screening  Completed   HIV Screening  Completed   HPV VACCINES  Aged Out    Health Maintenance  There are no preventive care reminders to display for this patient.  Lung Cancer Screening: (Low Dose CT Chest recommended if Age 35-80 years, 30 pack-year currently smoking OR have quit w/in 15years.) does not qualify.   Lung Cancer Screening Referral: n/a  Additional Screening:  Hepatitis C Screening:  does qualify; Completed 04/15/12  Vision Screening: Recommended annual ophthalmology exams for early detection of glaucoma and other disorders of the eye. Is the patient up to date with their annual eye exam?  Yes  Who is the provider or what is the name of the office in which the patient attends annual eye exams? Dr. Yvonna Alanis  If pt is not established with a provider, would they like to be referred to a provider to establish care? No .  Dental Screening: Recommended annual dental exams for proper oral hygiene  Community Resource Referral / Chronic Care Management: CRR required this visit?  No   CCM required this visit?  No      Plan:     I have personally reviewed and noted the following in the patient's chart:   Medical and social history Use of alcohol, tobacco or illicit drugs  Current medications and supplements including opioid prescriptions. Patient is not currently taking opioid prescriptions. Functional ability and status Nutritional status Physical activity Advanced directives List of other physicians Hospitalizations, surgeries, and ER visits in previous 12 months Vitals Screenings to include cognitive, depression, and falls Referrals and appointments  In addition, I have reviewed and discussed with patient certain preventive protocols, quality metrics, and best practice recommendations. A written personalized care plan for preventive services as well as general preventive health recommendations were provided to patient.     Denman George [place], [place]   [id number]   Nurse Notes: No concerns

## 2022-12-07 NOTE — Patient Instructions (Addendum)
[name] , Thank you for taking time to come for your Medicare Wellness Visit. I appreciate your ongoing commitment to your health goals. Please review the following plan we discussed and let me know if I can assist you in the future.   These are the goals we discussed:  Goals      Remain active and continue to work on improving health        This is a list of the screening recommended for you and due dates:  Health Maintenance  Topic Date Due   COVID-19 Vaccine (1) 12/15/2022*   Flu Shot  01/07/2023*   Medicare Annual Wellness Visit  04/09/2023*   Eye exam for diabetics  11/05/2023*   Hemoglobin A1C  05/03/2023   Complete foot exam   09/26/2023   Yearly kidney function blood test for diabetes  10/20/2023   Yearly kidney health urinalysis for diabetes  11/03/2023   Pap Smear  11/21/2025   DTaP/Tdap/Td vaccine (9 - Td or Tdap) 07/07/2026   Hepatitis C Screening: USPSTF Recommendation to screen - Ages 33-79 yo.  Completed   HIV Screening  Completed   HPV Vaccine  Aged Out  *Topic was postponed. The date shown is not the original due date.    Advanced directives: Advance directive discussed with you today. I have provided a copy for you to complete at home and have notarized. Once this is complete please bring a copy in to our office so we can scan it into your chart.   Conditions/risks identified: Aim for 30 minutes of exercise or brisk walking, 6-8 glasses of water, and 5 servings of fruits and vegetables each day.   Next appointment: Follow up in one year for your annual wellness visit.   Preventive Care 38-9 Years Old, Female Preventive care refers to lifestyle choices and visits with your health care provider that can promote health and wellness. Preventive care visits are also called wellness exams. What can I expect for my preventive care visit? Counseling During your preventive care visit, your health care provider may ask about your: Medical history, including: Past  medical problems. Family medical history. Pregnancy history. Current health, including: Menstrual cycle. Method of birth control. Emotional well-being. Home life and relationship well-being. Sexual activity and sexual health. Lifestyle, including: Alcohol, nicotine or tobacco, and drug use. Access to firearms. Diet, exercise, and sleep habits. Work and work Statistician. Sunscreen use. Safety issues such as seatbelt and bike helmet use. Physical exam Your health care provider may check your: Height and weight. These may be used to calculate your BMI (body mass index). BMI is a measurement that tells if you are at a healthy weight. Waist circumference. This measures the distance around your waistline. This measurement also tells if you are at a healthy weight and may help predict your risk of certain diseases, such as type 2 diabetes and high blood pressure. Heart rate and blood pressure. Body temperature. Skin for abnormal spots. What immunizations do I need? Vaccines are usually given at various ages, according to a schedule. Your health care provider will recommend vaccines for you based on your age, medical history, and lifestyle or other factors, such as travel or where you work. What tests do I need? Screening Your health care provider may recommend screening tests for certain conditions. This may include: Pelvic exam and Pap test. Lipid and cholesterol levels. Diabetes screening. This is done by checking your blood sugar (glucose) after you have not eaten for a while (fasting). Hepatitis  B test. Hepatitis C test. HIV (human immunodeficiency virus) test. STI (sexually transmitted infection) testing, if you are at risk. BRCA-related cancer screening. This may be done if you have a family history of breast, ovarian, tubal, or peritoneal cancers. Talk with your health care provider about your test results, treatment options, and if necessary, the need for more tests. Follow  these instructions at home: Eating and drinking  Eat a healthy diet that includes fresh fruits and vegetables, whole grains, lean protein, and low-fat dairy products. Take vitamin and mineral supplements as recommended by your health care provider. Do not drink alcohol if: Your health care provider tells you not to drink. You are pregnant, may be pregnant, or are planning to become pregnant. If you drink alcohol: Limit how much you have to 0-1 drink a day. Know how much alcohol is in your drink. In the [place] one drink equals one 12 oz bottle of beer (355 mL), one 5 oz glass of wine (148 mL), or one 1 oz glass of hard liquor (44 mL). Lifestyle Brush your teeth every morning and night with fluoride toothpaste. Floss one time each day. Exercise for at least 30 minutes 5 or more days each week. Do not use any products that contain nicotine or tobacco. These products include cigarettes, chewing tobacco, and vaping devices, such as e-cigarettes. If you need help quitting, ask your health care provider. Do not use drugs. If you are sexually active, practice safe sex. Use a condom or other form of protection to prevent STIs. If you do not wish to become pregnant, use a form of birth control. If you plan to become pregnant, see your health care provider for a prepregnancy visit. Find healthy ways to manage stress, such as: Meditation, yoga, or listening to music. Journaling. Talking to a trusted person. Spending time with friends and family. Minimize exposure to UV radiation to reduce your risk of skin cancer. Safety Always wear your seat belt while driving or riding in a vehicle. Do not drive: If you have been drinking alcohol. Do not ride with someone who has been drinking. If you have been using any mind-altering substances or drugs. While texting. When you are tired or distracted. Wear a helmet and other protective equipment during sports activities. If you have firearms in your house,  make sure you follow all gun safety procedures. Seek help if you have been physically or sexually abused. What's next? Go to your health care provider once a year for an annual wellness visit. Ask your health care provider how often you should have your eyes and teeth checked. Stay up to date on all vaccines. This information is not intended to replace advice given to you by your health care provider. Make sure you discuss any questions you have with your health care provider. Document Revised: 03/23/2021 Document Reviewed: 03/23/2021 Elsevier Patient Education  [employer].

## 2022-12-19 ENCOUNTER — Ambulatory Visit: Payer: Medicare Other | Admitting: Obstetrics and Gynecology

## 2022-12-20 ENCOUNTER — Other Ambulatory Visit (HOSPITAL_COMMUNITY): Payer: Self-pay

## 2022-12-20 MED ORDER — MOUNJARO 7.5 MG/0.5ML ~~LOC~~ SOAJ
SUBCUTANEOUS | 0 refills | Status: DC
  Filled 2022-12-20: qty 2, 28d supply, fill #0

## 2022-12-20 MED ORDER — LISDEXAMFETAMINE DIMESYLATE 60 MG PO CAPS
60.0000 mg | ORAL_CAPSULE | Freq: Every morning | ORAL | 0 refills | Status: DC
  Filled 2022-12-25: qty 30, 30d supply, fill #0
  Filled 2022-12-27: qty 10, 10d supply, fill #0
  Filled 2023-01-02: qty 20, 20d supply, fill #0

## 2022-12-21 ENCOUNTER — Other Ambulatory Visit (HOSPITAL_COMMUNITY): Payer: Self-pay

## 2022-12-21 ENCOUNTER — Ambulatory Visit: Payer: Medicare Other | Admitting: Podiatry

## 2022-12-23 ENCOUNTER — Other Ambulatory Visit (HOSPITAL_COMMUNITY): Payer: Self-pay

## 2022-12-25 ENCOUNTER — Other Ambulatory Visit (HOSPITAL_COMMUNITY): Payer: Self-pay

## 2022-12-26 ENCOUNTER — Other Ambulatory Visit (HOSPITAL_COMMUNITY): Payer: Self-pay

## 2022-12-26 ENCOUNTER — Telehealth: Payer: Self-pay

## 2022-12-26 MED ORDER — CARIPRAZINE HCL 6 MG PO CAPS
6.0000 mg | ORAL_CAPSULE | Freq: Every day | ORAL | 1 refills | Status: DC
  Filled 2022-12-26: qty 30, 30d supply, fill #0

## 2022-12-26 NOTE — Progress Notes (Signed)
   Care Guide Note  12/26/2022 Name: [name] MRN: [id number] DOB: 1986/05/22  Referred by: [name], MD Reason for referral : Care Coordination (Outreach to schedule with Pharm d New MM DM )   [name] is a 37 y.o. year old female who is a primary care patient of Dennard Schaumann, Cammie Mcgee, MD. [name] was referred to the pharmacist for assistance related to DM.    An unsuccessful telephone outreach was attempted today to contact the patient who was referred to the pharmacy team for assistance with medication management. Additional attempts will be made to contact the patient.   Noreene Larsson, [place], [place] [address] Direct Dial: [phone] [email or web address]

## 2022-12-27 ENCOUNTER — Other Ambulatory Visit (HOSPITAL_COMMUNITY): Payer: Self-pay

## 2022-12-27 ENCOUNTER — Other Ambulatory Visit: Payer: Self-pay

## 2022-12-27 MED ORDER — INSULIN LISPRO 100 UNIT/ML IJ SOLN
INTRAMUSCULAR | 11 refills | Status: DC
  Filled 2022-12-27: qty 50, 33d supply, fill #0

## 2022-12-27 MED ORDER — INSULIN LISPRO 100 UNIT/ML IJ SOLN
INTRAMUSCULAR | 0 refills | Status: AC
  Filled 2022-12-27: qty 10, 6d supply, fill #0

## 2022-12-28 ENCOUNTER — Other Ambulatory Visit (HOSPITAL_COMMUNITY): Payer: Self-pay

## 2022-12-29 ENCOUNTER — Ambulatory Visit: Payer: Medicare Other | Admitting: Podiatry

## 2023-01-02 ENCOUNTER — Other Ambulatory Visit (HOSPITAL_COMMUNITY): Payer: Self-pay

## 2023-01-03 ENCOUNTER — Ambulatory Visit (INDEPENDENT_AMBULATORY_CARE_PROVIDER_SITE_OTHER): Payer: Medicare Other | Admitting: Psychiatry

## 2023-01-03 ENCOUNTER — Encounter: Payer: Self-pay | Admitting: Psychiatry

## 2023-01-03 ENCOUNTER — Other Ambulatory Visit: Payer: Self-pay

## 2023-01-03 ENCOUNTER — Other Ambulatory Visit (HOSPITAL_COMMUNITY): Payer: Self-pay

## 2023-01-03 DIAGNOSIS — F4001 Agoraphobia with panic disorder: Secondary | ICD-10-CM | POA: Diagnosis not present

## 2023-01-03 DIAGNOSIS — G2581 Restless legs syndrome: Secondary | ICD-10-CM

## 2023-01-03 DIAGNOSIS — F411 Generalized anxiety disorder: Secondary | ICD-10-CM | POA: Diagnosis not present

## 2023-01-03 DIAGNOSIS — F5105 Insomnia due to other mental disorder: Secondary | ICD-10-CM

## 2023-01-03 DIAGNOSIS — F3162 Bipolar disorder, current episode mixed, moderate: Secondary | ICD-10-CM | POA: Diagnosis not present

## 2023-01-03 DIAGNOSIS — F902 Attention-deficit hyperactivity disorder, combined type: Secondary | ICD-10-CM

## 2023-01-03 DIAGNOSIS — F5081 Binge eating disorder: Secondary | ICD-10-CM

## 2023-01-03 MED ORDER — VRAYLAR 4.5 MG PO CAPS
1.0000 | ORAL_CAPSULE | Freq: Every day | ORAL | 2 refills | Status: DC
  Filled 2023-01-03: qty 30, 30d supply, fill #0

## 2023-01-03 NOTE — Progress Notes (Signed)
[name] [id number] 03-May-1986 37 y.o.    Subjective:   Patient ID:  [name] is a 37 y.o. (DOB 26-Jun-1986) female.  Chief Complaint:  Chief Complaint  Patient presents with   Follow-up   Depression   Anxiety   Stress    Depression        Associated symptoms include appetite change.  Past medical history includes anxiety.   Anxiety Symptoms include nervous/anxious behavior. Patient reports no nausea or palpitations.     [name] presents to the office today for follow-up of bipolar disorder and generalized anxiety disorder. Historically seen with [name].  When seen June 05, 2019.  The following changes were made: Change pramipexole ER and increase to 0.75 mg pm to rid nausea and improve mood in the afternoon.  For tiredness in the afternoon switch Equetro to 400 mg AM , 1@ 6 and 1 at 11. For panic in daytime start gabapentin 300 mg each am as preventative.  She's only taking it prn now. We continued unchanged Latuda 120 mg and lamotrigine 300 mg twice daily for bipolar disorder. She just got the ER pramipexole about a week ago.  Can't tell difference with nausea DT  gastroparesis worse lately with constipation Less afternoon tiredness with current split Equetro dose.    seen July 22, 2019.  Meds were not changed at that visit.  She called back to October 20 wanting to try valproate because of racing thoughts and difficulty sleeping.  Stating the trazodone was not working.  She was told it was okay to start Depakote ER 500 mg nightly and then gradually increased to 1500 mg nightly.  She called back again on October 6 and this physician noted the following: RTC  Pt called to report new med she started having a lot of side effects mood swings, sounds in ears, suicidal thoughts, tremors. Causing issues with husband She is currently taking Depakote ER 1500 mg nightly. She commits to safety.  The only symptom directly  attributable to Depakote would be the tremor.  However its presence would indicate she is not going to be able to tolerate a higher dose of Depakote to control her mood symptoms.  Therefore we will wean the Depakote.  In order to control the mood symptoms we will increase the Equetro at the evening dose which should not cause significant side effects problems during the day but help with mood stability.  This increase should also make it easier to wean the Depakote quickly.  She agrees to this plan.  She will call back if the suicidal thoughts become too intense to tolerate.  Reduce Depakote ER 500 mg tablets to 1 a night for 4 nights then stop Increase Equetro 200 mg capsules to 2 in the morning and 3 nightly.  [name], MD, DFAPA  She's been back on Equetro and doing OK. 2 weeks leading to Xmas with a lot of anger and lashing out a good bit.  Had run out of mirapex and Latuda for a couple of days in that time frame.  Crying is less now.  No SE CBZ now.  A lot of migraines off her Trokendi bc risk kidney stones.  Plans Botox for HA.  Usually sleeping OK without trazodone.   Still taking Latuda, buspirone, gabapentin, lamotrigine 300 mg BID.    seen 12.30.21 without med change.    12/17/19 appt with the following noted: Since then increased gabapentin for anxiety to 600 BID-TID which helped but  caused some tiredness.  Needing trazodone more bc EMA/EFA.  She's stopped caffeine.  Mood stability is getting better despite a lot of stress. $ stress and racing thoughts at night and scattered some with stressors.  Depression is mild lately.  Took trip with H last week helped.  Coming up on 2 year anniversary of B's death.  B diabetic died last year with DM and kidney failure. Not as much anger irritability.  Some mood swings early FEB but better now. Custody battle with exH.  He's nicer to her now.   Weight going up. Hates weight gain effects of meds.    Consistent with meds.  No sig SI other than  fleeting rarely.  Scattered concentration and racing thoughts randomly.   Sleep OK. Plan: No med changes/ FU 4 mos  06/18/20 TC saying she stopped all psych meds end of August.  06/22/20 appt with the following noted: Had gradually dropped off meds and not sure why.  Stoped Latuda, buspirone, CBZ. Pramipexole., and lamotrigine.  Sleep Ok without trazodone.  Gabapentin less tolerated as prn. Stopped Equetro over a month ago DT insurance.  Back on regular medicare and it might be covered. A lot of stress here lately. Lost father 01/01/20 of MI.  since here and called got propranolol and it hleped crying.  Also got some clonazepam.   Had given up on life and everything.  Not SI today but has been.  No plan.  Depression and mood swings including anger.  Anxiety. She and therapist say she's depressed and recommended IOP at [facility]. Agrees to retry Seroquel which helped in the past Plan: Acute decompensation because of psychiatric noncompliance. quetiapine ER 1 at night for 4 nights, then 2 at night for 4 nights, then 3 at night.  07/13/2020 appointment with the following noted: Patient called in between appointments complaining of quetiapine call causing restless legs.  Gabapentin was called in with instructions to make this side effect resolve. Misunderstood and only taking 300 mg Seroquel XR for a couple of days. Referred to [facility] and has continued therapy. Feels better on Seroquel.  No longer SI.  More stable.  Less anger.  Depression down to mild generally.  Fewer panic attacks. Sleeps with Seroquel 8 hours.  RLS is managed fairly well at this time. Initially too tired with Seroquel and better now. Clonazepam once in 2 weeks for panic driving. Referred to IOP and continued Seroquel ER 600 HS  08/10/20 appt with the following noted: Insurance wouldn't cover enough of IOP.  Still seeing therapist every 2 weeks. Consistent with Seroquel XR 600 daily and needs gabapentin with it DT RLS. No  longer taking propranolol. Taking clonazepam only twice in last month. I feel pretty stable right now.  First Xmas without father.  Feels better than mos ago.  More stable.  Sleep good.  Hangover if late with Seroquel.  Not unusually angry, under control.  Depression is better.  Occ down day.  Function is pretty good with routine things.   Energy is not great esp in the AM Gabapentin will make her feel too drunk if dose is too high. SE Seroquel makes her hungry. Plan: no med changes  09/09/2020 appointment with the following noted: Gabapentin manages the RLS. Compliant with Seroquel XR 600 mg HS. Kind of stressed out and sort of moody.  Mild mood swings with some on edge and agitated easily.  No SI. Kind of down over weight management visit and lack of progress.  No  energy to want to do anything.  Sleep is fine with Seroquel.  In bed at least 8 hours daily. Used Klonopin twice since last visit.  Triggered panic attack last week. Back on Reglan for 2 mos. Plan: Prescription given with schedule to increase to the target dose of Wellbutrin SR 100 mg tablets 2 twice daily and topiramate 25 mg tablets 2 twice daily both to help with weight management and energy and mild depression.  12/16/2020 phone call wanting to stop Seroquel.  02/10/2021 appointment with the following noted: Missed appt here.  She stopped Seroquel on her own gradually and completely about a month ago.  Was taking it off and on.  The gabapentin was interfering with her ability to function.  Wanting to lay down.  But had to take the gabapentin bc Seroquel giving her RLS.  Still very moody but not over the edge like she was in the past. Clonazepam once or twice weekly.  Stopped gabapentin bc no longer has RLS off the Seroquel. No trouble sleeping usually. Feels agitated but not manic.  A lot of stress at home. Lives in [place] beside in-laws with conflict over $ with them.  A lot of stress right now and hurt and anger with them over  things. Not many friends to talk to and not a lot of conversation with H.  Feels like needs to restart therapy. Doing video therapy with Christina Hussami. Wellbutrin seemed to help mental state some but didn't help weight much. Plan: Consider retrying Vraylar bc weight gain.  Yes Start 1.5 mg for 1 week then 3 mg daily.  04/14/2021 appointment with the following noted: Seems to be working good with Vraylar 1.5 mg and stopped Wellbutrin. No SE. She started phentermine from Dr. Juleen China with Topomax. No SE mania and no sig benefit at 1.2 tablet.  No SE. Sleep normal and not manic. Depression is better with Vraylar but down a couple of times weekly and still a lot of stress at home which affects mood. Mo in law is source of stress bc lives in camper on her property.  She hates me. Won't allow her to use washer.  She's the reason pt needs clonazepam. $ stress.  Ran out of food stamps.  All 5 kids under their care for the summer.  She cooks for M in [employer] but won't help with cost. Disc D's psych provblems Plan no med changes  09/15/2021 appt noted:   Rougher time last few mos. Still seeing therapist. Hand surgery October and got depressed and gained weight and stopped meds for a week or 2.  Couldn't do anything after the surgery for awhile.  Didn't feel family was helping.  Worry over $.  H not working much and she's on disability.  Still living in camper which is falling apart. Has talked to H about helping and he's looking for a job.  Stress is usual cause of her depression and anxiety. Occ panic attacks.  Some isolation lately. ON Vraylar 3 mg daily.  No SE. Meds covered well DT MCR and MCD. M says she can tell when pt misses meds bc gets more emotional and posts on Facebook change.  03/29/22 appt noted: Mostly compliant with Vraylar.  No SE. Stressed last couple of weeks and a little more depressed and irritable. Small things set me off.  Feels depression is a factor and more tearful.  Stress  between she and H and housing problems.  Living in camper for 2 years.  Failed  attempt to move. Taking clonazepam 0.5 mg 3-4 times per week.   Therapist Plan: Therefore increase Vraylar to 4.5 mg daily  06/22/2022 appointment noted: Current psych meds are Vraylar 4.5 mg daily, clonazepam 0.5 mg 3 times daily as needed anxiety Clonazepam now only a couple of times per month.   Did move into her own place for a couple of weeks. Kids are really happy alos. Did fine with increase Vraylar and feels it really helped.  Mood good and more stable. No SE. She asked about ADD bc forgets a lot and easily distracted and poor memory at times. For example when cleaning will tend to jump from one task to another.  Always tends to have racing thoughts.  Always tended to want to keep doing things.  Had these sx for years as long as can remember.  Hard to watch TV show bc always feels she has to be doing something. Stayed on task better and better mood with phentermine including less irritability.  Took up to 37.5 mg daily. Still too irritable with kids. Sleep is good usually bc tired when kids go to bed.  DM can interfere with sleep bc nocturia.  Total 6-8 hours of sleep.   Still struggling with weight.   Forgets things kids and H tell her.   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 Juleen China and tried vyvanse but out of stock. Anxiety is better than it was.  Still kind of moody a lot and forgetful a lot.  Kids ask her to do things but she forgets. Still on Vryalar 4.5 mg daily wihtout crying or sadness. No Se Plan: For irritability increase Vraylar to 6 mg daily Vyvanse RX  01/03/23 appt noted: Dr. Juleen China got her increased to Vyvanse 60 mg AM.   Was out of Vraylar for a couple of weeks DT [facility] problems but back on for 5 days. Not irritable much anymore but is solemn some.  While out of it more depressed. Don't feel a lot of joy in her life since she was  here.   Not needing to take clonazepam bc anxiety is better.   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 He hasn't gotten paid yet so $ stress. Plans to seek marital therapy with pastor. Asks about something to lift her mood.  Remarried April 2020.  38 yo D & 38 yo D ADD .  One of the kids father has ADHD Disability review approved mostly for psych reasons.  Going to [facility] weight management and just started vitamin D. Level 24.5.  Past Psychiatric Medication Trials:  lamotrigine 300 twice daily, Equetro 900 daily,  Depakote 1500 SE,  History topiramate for migraine Vraylar 4.5,  Abilify 15,  lithium felt slowed down,  Seroquel worked but stopped DT insurance and SE RLS and hunger,  Latuda 120, sertraline, duloxetine,   gabapentin 600 mg 3 times daily tiredness,  buspirone 30 twice daily,,  clonazepam, trazodone, Xanax , hydroxyzine NR, Hx phentermine seemed to help energy and mood.  Didn't help wt loss.  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  Cardiovascular:  Negative for palpitations.  Gastrointestinal:  Negative for nausea and vomiting.  Musculoskeletal:  Positive for arthralgias.       Recent hand surgery healing  Psychiatric/Behavioral:  Positive for depression. Negative for agitation and sleep disturbance. The patient is nervous/anxious.     Medications: I have reviewed the patient's current medications.  Current Outpatient Medications  Medication Sig Dispense Refill   albuterol (PROVENTIL) (2.5 MG/3ML) 0.083% nebulizer solution Take 3 mLs (2.5 mg total) by nebulization every 6 (six) hours as needed for wheezing or shortness of breath. 150 mL 1   albuterol (VENTOLIN HFA) 108 (90 Base) MCG/ACT inhaler  TAKE 2 PUFFS BY MOUTH EVERY 6 HOURS AS NEEDED FOR WHEEZE OR SHORTNESS OF BREATH 18 each 2   ammonium lactate (AMLACTIN) 12 % lotion Apply 1 Application topically as needed for dry skin. 400 g 0   Budeson-Glycopyrrol-Formoterol (BREZTRI AEROSPHERE) 160-9-4.8 MCG/ACT AERO Inhale 2 puffs into the lungs 2 (two) times daily.     Cariprazine HCl (VRAYLAR) 4.5 MG CAPS Take 1 capsule (4.5 mg total) by mouth daily. 30 capsule 2   carvedilol (COREG) 6.25 MG tablet Take 6.25 mg by mouth 2 (two) times daily.     clonazePAM (KLONOPIN) 0.5 MG tablet Take 1 tablet (0.5 mg total) by mouth every 8 (eight) hours as needed. for anxiety 20 tablet 0   FASENRA 30 MG/ML SOSY Inject into the skin.     fluticasone (FLONASE) 50 MCG/ACT nasal spray Place 2 sprays into both nostrils daily. (Patient taking differently: Place 2 sprays into both nostrils daily as needed (seasonal allergies.).) 16 g 2   furosemide (LASIX) 40 MG tablet Take 40 mg by mouth every other day.      GLUCAGON EMERGENCY 1 MG injection Inject 1 mg into the skin as needed (hypoglycemia.).      Insulin Human (INSULIN PUMP) SOLN Inject 1 each into the skin 3 times daily with meals, bedtime and 2 AM. Novolog: 36-38 units per day     insulin lispro (ADMELOG) 100 UNIT/ML injection Use under the skin according to insulin pump settings. Use up to 150 units per day. 50 mL 11   insulin lispro (HUMALOG) 100 UNIT/ML injection Inject up to 150 units daily according to insulin pump settings. 10 mL 0   Insulin Pen Needle 32G X 4 MM MISC 1 each by Does not apply route once a week. 50 each 0   levonorgestrel (MIRENA, 52 MG,) 20 MCG/24HR IUD 1 each by Intrauterine route once.     lisdexamfetamine (VYVANSE) 60 MG capsule Take 1 capsule (60 mg total) by mouth every morning. 30 capsule 0   losartan (COZAAR) 50 MG tablet Take 50 mg by mouth daily.     montelukast (SINGULAIR) 10 MG tablet Take 1 tablet (10 mg total) by mouth at bedtime. 90 tablet 1   NOVOLOG 100 UNIT/ML  injection Inject 36-38 Units into the skin daily. Via insulin pump per sliding scale     pantoprazole (PROTONIX) 40 MG tablet Take 1 tablet (40 mg total) by mouth daily. 30 tablet 3   pravastatin (PRAVACHOL) 20 MG tablet SMARTSIG:1 Tablet(s) By Mouth Every Evening     promethazine (PHENERGAN) 25 MG tablet Take 1 tablet (25 mg total) by mouth every 8 (eight) hours as needed for nausea or vomiting. 30 tablet 0   terbinafine (LAMISIL) 250 MG tablet Take 1 tablet (250 mg total) by mouth daily. 90 tablet 0   tirzepatide (MOUNJARO) 5 MG/0.5ML Pen Inject 5 mg into the skin once a week. 6 mL 1   tirzepatide (MOUNJARO) 7.5 MG/0.5ML Pen Inject  7.5mg  under the skin once daily. 2 mL 0   tiZANidine (ZANAFLEX) 4 MG tablet Take 4 mg by mouth 2 (two) times daily as needed for muscle spasms.      Ubrogepant (UBRELVY) 100 MG TABS Take 1 tablet (100 mg total) by mouth daily as needed (migraine headaches.). 30 tablet 3   No current facility-administered medications for this visit.    Medication Side Effects: tired in the afternoon.  Allergies:  Allergies  Allergen Reactions   Nsaids Other (See Comments)    Due to Renal disease   Other    Toradol [Ketorolac Tromethamine]     Cannot take d/t kidney issues    Ceftin Rash    Past Medical History:  Diagnosis Date   Anemia    Anemia of chronic disease 06/07/2012   Formatting of this note might be different from the original. Hematology consultation 07-13-16 (see note)--> Anemia of chronic disease Required 3 transfusions during pregnancy  Last Assessment & Plan:  Formatting of this note might be different from the original. She has now required 3 blood transfusions this pregnancy for her anemia of chronic disease. The most recent was one week ago when she bec   Anxiety    Asthma    Asthma 11/10/2013   Back pain    Bipolar 1 disorder (HCC)    Chest pain    Chronic renal failure syndrome, stage 3 (moderate) (HCC)    Colles' fracture of left radius [id number]    Complication of anesthesia    Constipation    Depression    Depression    Diabetic gastroparesis (HCC)    Diabetic neuropathy, type I diabetes mellitus ([place])    Diabetic ulcer of right great toe ([place]) 08/27/2020   Edema, lower extremity    Gallbladder disease    Gastroparesis    GERD (gastroesophageal reflux disease)    Headache(784.0)    Hypertension    Joint pain    Kidney stones    Palpitations    Polyneuropathy in diabetes(357.2)    PONV (postoperative nausea and vomiting)    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[place])    S/P carpal tunnel release left 10/16/19 11/04/2019   Shortness of breath    Tachycardia    baseline tachycardia    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[place])     Family History  Problem Relation Age of Onset   Diabetes Mother        type 2   Hypertension Mother    Obesity Mother    Diabetes Father    Hypertension Father    High Cholesterol Father    Sleep apnea Father    Obesity Father    Heart disease Father    Heart attack Father    Diabetes Brother        type 1   Renal Disease Brother        renal failure   Hypertension Brother    Hypertension Maternal Grandmother    Breast cancer Maternal Aunt 64   Colon cancer Maternal Aunt    Uterine cancer Maternal Aunt 65   Breast cancer Other    Rectal cancer Neg Hx    Esophageal cancer Neg Hx     Social History   Socioeconomic History   Marital status: Married    Spouse name: Not on file   Number of children: 2   Years of education: 12   Highest education level: 12th grade  Occupational History  Occupation: Materials engineer  Tobacco Use   Smoking status: Never   Smokeless tobacco: Never  Vaping Use   Vaping Use: Never used  Substance and Sexual Activity   Alcohol use: No   Drug use: No   Sexual activity: Yes    Partners: Male    Birth control/protection: I.U.D.    Comment: Mirena  Other Topics Concern   Not on file  Social History Narrative   Pt  has a daughter and boyfriend.    Dad passed away suddenly heart attack 12/2019   Social Determinants of Health   Financial Resource Strain: Medium Risk (12/07/2022)   Overall Financial Resource Strain (CARDIA)    Difficulty of Paying Living Expenses: Somewhat hard  Food Insecurity: Food Insecurity Present (12/07/2022)   Hunger Vital Sign    Worried About Running Out of Food in the Last Year: Sometimes true    Ran Out of Food in the Last Year: Sometimes true  Transportation Needs: No Transportation Needs (12/07/2022)   PRAPARE - Hydrologist (Medical): No    Lack of Transportation (Non-Medical): No  Physical Activity: Insufficiently Active (12/07/2022)   Exercise Vital Sign    Days of Exercise per Week: 2 days    Minutes of Exercise per Session: 30 min  Stress: No Stress Concern Present (12/07/2022)   [place]    Feeling of Stress : Only a little  Social Connections: Moderately Integrated (12/07/2022)   Social Connection and Isolation Panel [NHANES]    Frequency of Communication with Friends and Family: More than three times a week    Frequency of Social Gatherings with Friends and Family: Once a week    Attends Religious Services: More than 4 times per year    Active Member of [employer] or Organizations: No    Attends Archivist Meetings: Never    Marital Status: Married  Human resources officer Violence: Not At Risk (12/07/2022)   Humiliation, Afraid, Rape, and Kick questionnaire    Fear of Current or Ex-Partner: No    Emotionally Abused: No    Physically Abused: No    Sexually Abused: No    Past Medical History, Surgical history, Social history, and Family history were reviewed and updated as appropriate.   Please see review of systems for further details on the patient's review from today.   Objective:   Physical Exam:  There were no vitals taken for this visit.  Physical  Exam Constitutional:      General: She is not in acute distress. Musculoskeletal:        General: No deformity.  Neurological:     Mental Status: She is alert and oriented to person, place, and time.     Cranial Nerves: No dysarthria.     Coordination: Coordination normal.  Psychiatric:        Attention and Perception: Attention and perception normal. She does not perceive auditory or visual hallucinations.        Mood and Affect: Mood is anxious. Mood is not depressed. Affect is not labile, blunt, angry or inappropriate.        Speech: Speech normal.        Behavior: Behavior normal. Behavior is cooperative.        Thought Content: Thought content normal. Thought content is not paranoid or delusional. Thought content does not include homicidal or suicidal ideation. Thought content does not include suicidal plan.  Cognition and Memory: Cognition and memory normal.        Judgment: Judgment normal.     Comments: Insight intact Not sig Irritable  depressed but feels flat     Lab Review:     Component Value Date/Time   NA 137 10/19/2022 1457   NA 137 04/04/2021 1250   K 5.0 10/19/2022 1457   CL 102 10/19/2022 1457   CO2 25 10/19/2022 1457   GLUCOSE 325 (H) 10/19/2022 1457   BUN 15 10/19/2022 1457   BUN 18 04/04/2021 1250   CREATININE 1.16 (H) 10/19/2022 1457   CALCIUM 9.4 10/19/2022 1457   PROT 6.3 10/19/2022 1457   PROT 7.1 04/04/2021 1250   ALBUMIN 4.5 04/04/2021 1250   AST 11 10/19/2022 1457   ALT 13 10/19/2022 1457   ALKPHOS 155 (H) 04/04/2021 1250   BILITOT 0.4 10/19/2022 1457   BILITOT 0.4 04/04/2021 1250   GFRNONAA 43 (L) 07/29/2020 1246   GFRNONAA 36 (L) 06/17/2018 1231   GFRAA 50 (L) 07/29/2020 1246   GFRAA 42 (L) 06/17/2018 1231       Component Value Date/Time   WBC 7.6 10/19/2022 1457   RBC 4.83 10/19/2022 1457   HGB 14.0 10/19/2022 1457   HGB 15.2 04/04/2021 1250   HCT 42.6 10/19/2022 1457   HCT 45.8 04/04/2021 1250   PLT 283 10/19/2022 1457    PLT 287 04/04/2021 1250   MCV 88.2 10/19/2022 1457   MCV 87 04/04/2021 1250   MCH 29.0 10/19/2022 1457   MCHC 32.9 10/19/2022 1457   RDW 12.2 10/19/2022 1457   RDW 12.4 04/04/2021 1250   LYMPHSABS 2,014 10/19/2022 1457   LYMPHSABS 2.4 04/04/2021 1250   MONOABS 0.8 10/14/2019 0920   EOSABS 0 (L) 10/19/2022 1457   EOSABS 0.3 04/04/2021 1250   BASOSABS 8 10/19/2022 1457   BASOSABS 0.1 04/04/2021 1250    No results found for: "POCLITH", "LITHIUM"   No results found for: "PHENYTOIN", "PHENOBARB", "VALPROATE", "CBMZ"   .res Assessment: Plan:    Jasmen was seen today for follow-up, depression, anxiety and stress.  Diagnoses and all orders for this visit:  Bipolar 1 disorder, mixed, moderate (HCC) -     Cariprazine HCl (VRAYLAR) 4.5 MG CAPS; Take 1 capsule (4.5 mg total) by mouth daily.  Generalized anxiety disorder  Attention deficit hyperactivity disorder (ADHD), combined type  Panic disorder with agoraphobia  Insomnia due to mental condition  Binge-eating disorder, moderate  Restless leg syndrome, controlled    Greater than 50% of 30 min non face to face time with patient was spent on counseling and coordination of care. We discussed Patient with a history of multiple med failures and history of psychiatric hospitalization and suicide attempts recent worsening of bipolar depression.  Chronic stressors contribute to mood and anxiety problems.   Concern about potential weight gain with medications.  She has taken the major mood stabilizers with low weight gain potential. Discussed the pros and cons of switching mood stabilizers.  Could consider ultra low-dose lithium.     Ongoing chronic stressful situation living in a camper with her children and husband in a very small space.  Discussed what realistically meds can help and what they cannot help.   Too flat so reduce Vraylar to 4.5 mg daily   Disc poss of ADD but risk ADD meds causing mood swings.  She reports hx mood  benefit with phentermine but minimal wt loss.   ADD scale today  I'm not comfortable using short  acting stimulants in this pt.  Shortage of Vyvanse at this time but consider.  This could help binge eating and cognitive complaints.    Still seeing Dr. Juleen China for wt loss.    OK trial of Vyvanse for BED and ADD.  Will try to get it again. ADHD SRS : inattentive 16, hyperactive 31= total 48, high Rx Vyvanse 40 mg AM  Use clonazepam rarely for benefit from Stimulant.  Discussed potential metabolic side effects associated with atypical antipsychotics, as well as potential risk for movement side effects. Advised pt to contact office if movement side effects occur.    RLS Not a problem now.  Follow-up 2 months  [name], MD, DFAPA   Please see After Visit Summary for patient specific instructions.  Future Appointments  Date Time Provider [place]  01/11/2023  3:15 PM Trula Slade, [place] [facility]  02/20/2023 10:00 AM Cottle, [place]., MD [facility] None    No orders of the defined types were placed in this encounter.   -------------------------------

## 2023-01-04 ENCOUNTER — Other Ambulatory Visit (HOSPITAL_COMMUNITY): Payer: Self-pay

## 2023-01-04 MED ORDER — ACCU-CHEK GUIDE VI STRP
ORAL_STRIP | 4 refills | Status: AC
  Filled 2023-01-04: qty 250, 83d supply, fill #0
  Filled 2023-01-23: qty 300, 90d supply, fill #0

## 2023-01-05 ENCOUNTER — Other Ambulatory Visit (HOSPITAL_COMMUNITY): Payer: Self-pay

## 2023-01-11 ENCOUNTER — Ambulatory Visit (INDEPENDENT_AMBULATORY_CARE_PROVIDER_SITE_OTHER): Payer: Medicare Other | Admitting: Podiatry

## 2023-01-11 DIAGNOSIS — B351 Tinea unguium: Secondary | ICD-10-CM | POA: Diagnosis not present

## 2023-01-11 DIAGNOSIS — Z79899 Other long term (current) drug therapy: Secondary | ICD-10-CM | POA: Diagnosis not present

## 2023-01-11 NOTE — Patient Instructions (Signed)
Terbinafine Tablets What is this medication? TERBINAFINE (TER bin a feen) treats fungal infections of the nails. It belongs to a group of medications called antifungals. It will not treat infections caused by bacteria or viruses. This medicine may be used for other purposes; ask your health care provider or pharmacist if you have questions. COMMON BRAND NAME(S): Lamisil, Terbinex What should I tell my care team before I take this medication? They need to know if you have any of these conditions: Liver disease An unusual or allergic reaction to terbinafine, other medications, foods, dyes, or preservatives Pregnant or trying to get pregnant Breast-feeding How should I use this medication? Take this medication by mouth with water. Take it as directed on the prescription label at the same time every day. You can take it with or without food. If it upsets your stomach, take it with food. Keep taking it unless your care team tells you to stop. A special MedGuide will be given to you by the pharmacist with each prescription and refill. Be sure to read this information carefully each time. Talk to your care team regarding the use of this medication in children. Special care may be needed. Overdosage: If you think you have taken too much of this medicine contact a poison control center or emergency room at once. NOTE: This medicine is only for you. Do not share this medicine with others. What if I miss a dose? If you miss a dose, take it as soon as you can unless it is more than 4 hours late. If it is more than 4 hours late, skip the missed dose. Take the next dose at the normal time. What may interact with this medication? Do not take this medication with any of the following: Pimozide Thioridazine This medication may also interact with the following: Beta blockers Caffeine Certain medications for mental health conditions Cimetidine Cyclosporine Medications for fungal infections like fluconazole  and ketoconazole Medications for irregular heartbeat like amiodarone, flecainide and propafenone Rifampin Warfarin This list may not describe all possible interactions. Give your health care provider a list of all the medicines, herbs, non-prescription drugs, or dietary supplements you use. Also tell them if you smoke, drink alcohol, or use illegal drugs. Some items may interact with your medicine. What should I watch for while using this medication? Visit your care team for regular checks on your progress. You may need blood work while you are taking this medication. It may be some time before you see the benefit from this medication. This medication may cause serious skin reactions. They can happen weeks to months after starting the medication. Contact your care team right away if you notice fevers or flu-like symptoms with a rash. The rash may be red or purple and then turn into blisters or peeling of the skin. Or, you might notice a red rash with swelling of the face, lips or lymph nodes in your neck or under your arms. This medication can make you more sensitive to the sun. Keep out of the sun, If you cannot avoid being in the sun, wear protective clothing and sunscreen. Do not use sun lamps or tanning beds/booths. What side effects may I notice from receiving this medication? Side effects that you should report to your care team as soon as possible: Allergic reactions--skin rash, itching, hives, swelling of the face, lips, tongue, or throat Change in sense of smell Change in taste Infection--fever, chills, cough, or sore throat Liver injury--right upper belly pain, loss of appetite, nausea,   light-colored stool, dark yellow or brown urine, yellowing skin or eyes, unusual weakness or fatigue Low red blood cell level--unusual weakness or fatigue, dizziness, headache, trouble breathing Lupus-like syndrome--joint pain, swelling, or stiffness, butterfly-shaped rash on the face, rashes that get worse  in the sun, fever, unusual weakness or fatigue Rash, fever, and swollen lymph nodes Redness, blistering, peeling, or loosening of the skin, including inside the mouth Unusual bruising or bleeding Worsening mood, feelings of depression Side effects that usually do not require medical attention (report to your care team if they continue or are bothersome): Diarrhea Gas Headache Nausea Stomach pain Upset stomach This list may not describe all possible side effects. Call your doctor for medical advice about side effects. You may report side effects to FDA at 1-800-FDA-1088. Where should I keep my medication? Keep out of the reach of children and pets. Store between 20 and 25 degrees C (68 and 77 degrees F). Protect from light. Get rid of any unused medication after the expiration date. To get rid of medications that are no longer needed or have expired: Take the medication to a medication take-back program. Check with your pharmacy or law enforcement to find a location. If you cannot return the medication, check the label or package insert to see if the medication should be thrown out in the garbage or flushed down the toilet. If you are not sure, ask your care team. If it is safe to put it in the trash, take the medication out of the container. Mix the medication with cat litter, dirt, coffee grounds, or other unwanted substance. Seal the mixture in a bag or container. Put it in the trash. NOTE: This sheet is a summary. It may not cover all possible information. If you have questions about this medicine, talk to your doctor, pharmacist, or health care provider.  2023 Elsevier/Gold Standard (2021-04-19 00:00:00)  

## 2023-01-14 NOTE — Progress Notes (Signed)
Subjective:   Patient ID: [name], female   DOB: 37 y.o.   MRN: [id number]   HPI Chief Complaint  Patient presents with   Foot Ulcer     37 year old female presents the office for above concerns.  She states that she is doing well.  She just recently started Lamisil just a couple weeks ago not having any side effects.  She does not report any open wounds to her feet and no swelling or redness or any drainage.        Objective:  Physical Exam  General: AAO x3, NAD  Dermatological: There are no open lesions noted bilaterally.  Hyperkeratotic lesion submetatarsal 1 as well as medial hallux without any underlying ulceration or signs of infection.  There is no open lesions today.  Nails are about the same.  Vascular: Dorsalis Pedis artery and Posterior Tibial artery pedal pulses are palpable bilateral with immedate capillary fill time. There is no pain with calf compression, swelling, warmth, erythema.   Neruologic: Sensation decreased with Semmes Weinstein monofilament  Musculoskeletal: No gross boney pedal deformities bilateral. No pain, crepitus, or limitation noted with foot and ankle range of motion bilateral. Muscular strength 5/5 in all groups tested bilateral.  Hammertoes present.  Gait: Unassisted, Nonantalgic.       Assessment:   Preulcerative calluses, onychomycosis     Plan:  -Treatment options discussed including all alternatives, risks, and complications -Etiology of symptoms were discussed -As a courtesy debrided the calluses without any complications or bleeding.  Continue moisturizer, offloading. -She recently just on Lamisil not having any side effects.  Will recheck CBC and LFT in about 2 to 4 weeks and new order was placed. -Daily inspection.  Vivi Barrack DPM

## 2023-01-23 ENCOUNTER — Other Ambulatory Visit (HOSPITAL_COMMUNITY): Payer: Self-pay

## 2023-01-24 ENCOUNTER — Other Ambulatory Visit (HOSPITAL_COMMUNITY): Payer: Self-pay

## 2023-01-24 MED ORDER — MOUNJARO 7.5 MG/0.5ML ~~LOC~~ SOAJ
7.5000 mg | SUBCUTANEOUS | 0 refills | Status: DC
  Filled 2023-01-24 – 2023-02-13 (×4): qty 2, 28d supply, fill #0

## 2023-01-25 ENCOUNTER — Other Ambulatory Visit (HOSPITAL_COMMUNITY): Payer: Self-pay

## 2023-01-26 ENCOUNTER — Other Ambulatory Visit (HOSPITAL_COMMUNITY): Payer: Self-pay

## 2023-02-01 NOTE — Progress Notes (Signed)
   Care Guide Note  02/01/2023 Name: [name] MRN: [id number] DOB: 04-15-1986  Referred by: Donita Brooks, MD Reason for referral : Care Coordination (Outreach to schedule with Pharm d New MM DM )   [name] is a 37 y.o. year old female who is a primary care patient of Tanya Nones, Priscille Heidelberg, MD. [name] was referred to the pharmacist for assistance related to DM.    Successful contact was made with the patient to discuss pharmacy services. Patient declines engagement at this time. Contact information was provided to the patient should they wish to reach out for assistance at a later time.  Penne Lash, RMA Care Guide [facility]  [place], [place] [address] Direct Dial: [phone] [email or web address]

## 2023-02-07 ENCOUNTER — Other Ambulatory Visit (HOSPITAL_COMMUNITY): Payer: Self-pay

## 2023-02-13 ENCOUNTER — Other Ambulatory Visit (HOSPITAL_COMMUNITY): Payer: Self-pay

## 2023-02-14 ENCOUNTER — Other Ambulatory Visit (HOSPITAL_COMMUNITY): Payer: Self-pay

## 2023-02-15 ENCOUNTER — Other Ambulatory Visit: Payer: Self-pay

## 2023-02-20 ENCOUNTER — Ambulatory Visit: Payer: Medicare Other | Admitting: Psychiatry

## 2023-02-27 NOTE — Progress Notes (Unsigned)
[name] D[facility] [address] [place] [address] Phone: [phone]   Assessment and Plan:     There are no diagnoses linked to this encounter.  ***   Pertinent previous records reviewed include ***   Follow Up: ***     Subjective:   I, [name], am serving as a Neurosurgeon for [name]  Chief Complaint: leg pain   HPI:   02/28/2023 Patient is a 37 year old female complaining of leg pain. Patient states  Relevant Historical Information: ***  Additional pertinent review of systems negative.   Current Outpatient Medications:    albuterol (PROVENTIL) (2.5 MG/3ML) 0.083% nebulizer solution, Take 3 mLs (2.5 mg total) by nebulization every 6 (six) hours as needed for wheezing or shortness of breath., Disp: 150 mL, Rfl: 1   albuterol (VENTOLIN HFA) 108 (90 Base) MCG/ACT inhaler, TAKE 2 PUFFS BY MOUTH EVERY 6 HOURS AS NEEDED FOR WHEEZE OR SHORTNESS OF BREATH, Disp: 18 each, Rfl: 2   ammonium lactate (AMLACTIN) 12 % lotion, Apply 1 Application topically as needed for dry skin., Disp: 400 g, Rfl: 0   Budeson-Glycopyrrol-Formoterol (BREZTRI AEROSPHERE) 160-9-4.8 MCG/ACT AERO, Inhale 2 puffs into the lungs 2 (two) times daily., Disp: , Rfl:    Cariprazine HCl (VRAYLAR) 4.5 MG CAPS, Take 1 capsule (4.5 mg total) by mouth daily., Disp: 30 capsule, Rfl: 2   carvedilol (COREG) 6.25 MG tablet, Take 6.25 mg by mouth 2 (two) times daily., Disp: , Rfl:    clonazePAM (KLONOPIN) 0.5 MG tablet, Take 1 tablet (0.5 mg total) by mouth every 8 (eight) hours as needed. for anxiety, Disp: 20 tablet, Rfl: 0   FASENRA 30 MG/ML SOSY, Inject into the skin., Disp: , Rfl:    fluticasone (FLONASE) 50 MCG/ACT nasal spray, Place 2 sprays into both nostrils daily. (Patient taking differently: Place 2 sprays into both nostrils daily as needed (seasonal allergies.).), Disp: 16 g, Rfl: 2   furosemide (LASIX) 40 MG tablet, Take 40 mg by mouth every  other day. , Disp: , Rfl:    GLUCAGON EMERGENCY 1 MG injection, Inject 1 mg into the skin as needed (hypoglycemia.). , Disp: , Rfl:    glucose blood (ACCU-CHEK GUIDE) test strip, Use to check blood sugar 3 time a day, Disp: 300 strip, Rfl: 4   Insulin Human (INSULIN PUMP) SOLN, Inject 1 each into the skin 3 times daily with meals, bedtime and 2 AM. Novolog: 36-38 units per day, Disp: , Rfl:    insulin lispro (ADMELOG) 100 UNIT/ML injection, Use under the skin according to insulin pump settings. Use up to 150 units per day., Disp: 50 mL, Rfl: 11   insulin lispro (HUMALOG) 100 UNIT/ML injection, Inject up to 150 units daily according to insulin pump settings., Disp: 10 mL, Rfl: 0   Insulin Pen Needle 32G X 4 MM MISC, 1 each by Does not apply route once a week., Disp: 50 each, Rfl: 0   levonorgestrel (MIRENA, 52 MG,) 20 MCG/24HR IUD, 1 each by Intrauterine route once., Disp: , Rfl:    lisdexamfetamine (VYVANSE) 60 MG capsule, Take 1 capsule (60 mg total) by mouth every morning., Disp: 30 capsule, Rfl: 0   losartan (COZAAR) 50 MG tablet, Take 50 mg by mouth daily., Disp: , Rfl:    montelukast (SINGULAIR) 10 MG tablet, Take 1 tablet (10 mg total) by mouth at bedtime., Disp: 90 tablet, Rfl: 1   NOVOLOG 100 UNIT/ML injection, Inject 36-38 Units  into the skin daily. Via insulin pump per sliding scale, Disp: , Rfl:    pantoprazole (PROTONIX) 40 MG tablet, Take 1 tablet (40 mg total) by mouth daily., Disp: 30 tablet, Rfl: 3   pravastatin (PRAVACHOL) 20 MG tablet, SMARTSIG:1 Tablet(s) By Mouth Every Evening, Disp: , Rfl:    promethazine (PHENERGAN) 25 MG tablet, Take 1 tablet (25 mg total) by mouth every 8 (eight) hours as needed for nausea or vomiting., Disp: 30 tablet, Rfl: 0   terbinafine (LAMISIL) 250 MG tablet, Take 1 tablet (250 mg total) by mouth daily., Disp: 90 tablet, Rfl: 0   tirzepatide (MOUNJARO) 5 MG/0.5ML Pen, Inject 5 mg into the skin once a week., Disp: 6 mL, Rfl: 1   tirzepatide (MOUNJARO)  7.5 MG/0.5ML Pen, Inject 7.5 mg into the skin once a week., Disp: 2 mL, Rfl: 0   tiZANidine (ZANAFLEX) 4 MG tablet, Take 4 mg by mouth 2 (two) times daily as needed for muscle spasms. , Disp: , Rfl:    Ubrogepant (UBRELVY) 100 MG TABS, Take 1 tablet (100 mg total) by mouth daily as needed (migraine headaches.)., Disp: 30 tablet, Rfl: 3   Objective:     There were no vitals filed for this visit.    There is no height or weight on file to calculate BMI.    Physical Exam:    ***   Electronically signed by:  [name] [facility] 3:36 PM 02/27/23

## 2023-02-28 ENCOUNTER — Ambulatory Visit (INDEPENDENT_AMBULATORY_CARE_PROVIDER_SITE_OTHER): Payer: Medicare Other

## 2023-02-28 ENCOUNTER — Ambulatory Visit (INDEPENDENT_AMBULATORY_CARE_PROVIDER_SITE_OTHER): Payer: Medicare Other | Admitting: Sports Medicine

## 2023-02-28 VITALS — BP 122/72 | HR 86 | Ht 66.0 in | Wt 204.0 lb

## 2023-02-28 DIAGNOSIS — M25552 Pain in left hip: Secondary | ICD-10-CM | POA: Diagnosis not present

## 2023-02-28 DIAGNOSIS — G8929 Other chronic pain: Secondary | ICD-10-CM

## 2023-02-28 DIAGNOSIS — M5442 Lumbago with sciatica, left side: Secondary | ICD-10-CM | POA: Diagnosis not present

## 2023-02-28 MED ORDER — METHYLPREDNISOLONE 4 MG PO TBPK: ORAL_TABLET | ORAL | 0 refills | Status: DC

## 2023-02-28 NOTE — Patient Instructions (Signed)
Hip and low back HEP  Prednisone dos pak  Tylenol [phone] mg 2-3 times a day for pain relief  Voltaren gel over areas of pain  3 week follow up

## 2023-03-20 NOTE — Progress Notes (Deleted)
[name] D.[facility] [address] [place] [address] Phone: [phone]   Assessment and Plan:     There are no diagnoses linked to this encounter.  ***   Pertinent previous records reviewed include ***   Follow Up: ***     Subjective:   I, Brett Darko, am serving as a Neurosurgeon for Doctor Richardean Sale   Chief Complaint: leg pain    HPI:    02/28/2023 Patient is a 37 year old female complaining of leg pain. Patient states that she had left pain for years, she was dx with bursitis, she used to get CSI but the pain has changed, she had pain when sitting for long time, and then has pain when trying to walk after sitting , pain radiates to the back of the leg, sleep is hard she is a side sleeper, no numbness or tingling, tylenol doesn't help with the pain, she states she used to hear hip pop but doesn't do that stretching anymore that would cause the popping, she is TTP in that area , she states she can feel a knot   03/21/2023 Patient states    Relevant Historical Information: Hypertension, DM type I, CKD  Additional pertinent review of systems negative.   Current Outpatient Medications:    albuterol (PROVENTIL) (2.5 MG/3ML) 0.083% nebulizer solution, Take 3 mLs (2.5 mg total) by nebulization every 6 (six) hours as needed for wheezing or shortness of breath., Disp: 150 mL, Rfl: 1   albuterol (VENTOLIN HFA) 108 (90 Base) MCG/ACT inhaler, TAKE 2 PUFFS BY MOUTH EVERY 6 HOURS AS NEEDED FOR WHEEZE OR SHORTNESS OF BREATH, Disp: 18 each, Rfl: 2   ammonium lactate (AMLACTIN) 12 % lotion, Apply 1 Application topically as needed for dry skin., Disp: 400 g, Rfl: 0   Budeson-Glycopyrrol-Formoterol (BREZTRI AEROSPHERE) 160-9-4.8 MCG/ACT AERO, Inhale 2 puffs into the lungs 2 (two) times daily., Disp: , Rfl:    Cariprazine HCl (VRAYLAR) 4.5 MG CAPS, Take 1 capsule (4.5 mg total) by mouth daily., Disp: 30 capsule, Rfl: 2   carvedilol (COREG)  6.25 MG tablet, Take 6.25 mg by mouth 2 (two) times daily., Disp: , Rfl:    clonazePAM (KLONOPIN) 0.5 MG tablet, Take 1 tablet (0.5 mg total) by mouth every 8 (eight) hours as needed. for anxiety, Disp: 20 tablet, Rfl: 0   FASENRA 30 MG/ML SOSY, Inject into the skin., Disp: , Rfl:    fluticasone (FLONASE) 50 MCG/ACT nasal spray, Place 2 sprays into both nostrils daily. (Patient taking differently: Place 2 sprays into both nostrils daily as needed (seasonal allergies.).), Disp: 16 g, Rfl: 2   furosemide (LASIX) 40 MG tablet, Take 40 mg by mouth every other day. , Disp: , Rfl:    GLUCAGON EMERGENCY 1 MG injection, Inject 1 mg into the skin as needed (hypoglycemia.). , Disp: , Rfl:    glucose blood (ACCU-CHEK GUIDE) test strip, Use to check blood sugar 3 time a day, Disp: 300 strip, Rfl: 4   Insulin Human (INSULIN PUMP) SOLN, Inject 1 each into the skin 3 times daily with meals, bedtime and 2 AM. Novolog: 36-38 units per day, Disp: , Rfl:    insulin lispro (ADMELOG) 100 UNIT/ML injection, Use under the skin according to insulin pump settings. Use up to 150 units per day., Disp: 50 mL, Rfl: 11   insulin lispro (HUMALOG) 100 UNIT/ML injection, Inject up to 150 units daily according to insulin pump settings., Disp: 10 mL, Rfl: 0  Insulin Pen Needle 32G X 4 MM MISC, 1 each by Does not apply route once a week., Disp: 50 each, Rfl: 0   levonorgestrel (MIRENA, 52 MG,) 20 MCG/24HR IUD, 1 each by Intrauterine route once., Disp: , Rfl:    lisdexamfetamine (VYVANSE) 60 MG capsule, Take 1 capsule (60 mg total) by mouth every morning., Disp: 30 capsule, Rfl: 0   losartan (COZAAR) 50 MG tablet, Take 50 mg by mouth daily., Disp: , Rfl:    methylPREDNISolone (MEDROL DOSEPAK) 4 MG TBPK tablet, Take 6 tablets on day 1.  Take 5 tablets on day 2.  Take 4 tablets on day 3.  Take 3 tablets on day 4.  Take 2 tablets on day 5.  Take 1 tablet on day 6., Disp: 21 tablet, Rfl: 0   montelukast (SINGULAIR) 10 MG tablet, Take 1  tablet (10 mg total) by mouth at bedtime., Disp: 90 tablet, Rfl: 1   NOVOLOG 100 UNIT/ML injection, Inject 36-38 Units into the skin daily. Via insulin pump per sliding scale, Disp: , Rfl:    pantoprazole (PROTONIX) 40 MG tablet, Take 1 tablet (40 mg total) by mouth daily., Disp: 30 tablet, Rfl: 3   pravastatin (PRAVACHOL) 20 MG tablet, SMARTSIG:1 Tablet(s) By Mouth Every Evening, Disp: , Rfl:    promethazine (PHENERGAN) 25 MG tablet, Take 1 tablet (25 mg total) by mouth every 8 (eight) hours as needed for nausea or vomiting., Disp: 30 tablet, Rfl: 0   terbinafine (LAMISIL) 250 MG tablet, Take 1 tablet (250 mg total) by mouth daily., Disp: 90 tablet, Rfl: 0   tirzepatide (MOUNJARO) 5 MG/0.5ML Pen, Inject 5 mg into the skin once a week., Disp: 6 mL, Rfl: 1   tirzepatide (MOUNJARO) 7.5 MG/0.5ML Pen, Inject 7.5 mg into the skin once a week., Disp: 2 mL, Rfl: 0   tiZANidine (ZANAFLEX) 4 MG tablet, Take 4 mg by mouth 2 (two) times daily as needed for muscle spasms. , Disp: , Rfl:    Ubrogepant (UBRELVY) 100 MG TABS, Take 1 tablet (100 mg total) by mouth daily as needed (migraine headaches.)., Disp: 30 tablet, Rfl: 3   Objective:     There were no vitals filed for this visit.    There is no height or weight on file to calculate BMI.    Physical Exam:    ***   Electronically signed by:  [name] D[facility] 7:09 AM 03/20/23

## 2023-03-21 ENCOUNTER — Ambulatory Visit: Payer: Medicare Other | Admitting: Sports Medicine

## 2023-03-23 NOTE — Progress Notes (Unsigned)
[name] D.[facility] [address] [place] [address] Phone: [phone]   Assessment and Plan:     There are no diagnoses linked to this encounter.  ***   Pertinent previous records reviewed include ***   Follow Up: ***     Subjective:   I, [name], am serving as a Neurosurgeon for Doctor Richardean Sale   Chief Complaint: leg pain    HPI:    02/28/2023 Patient is a 37 year old female complaining of leg pain. Patient states that she had left pain for years, she was dx with bursitis, she used to get CSI but the pain has changed, she had pain when sitting for long time, and then has pain when trying to walk after sitting , pain radiates to the back of the leg, sleep is hard she is a side sleeper, no numbness or tingling, tylenol doesn't help with the pain, she states she used to hear hip pop but doesn't do that stretching anymore that would cause the popping, she is TTP in that area , she states she can feel a knot   03/23/2023 Patient states   Relevant Historical Information: Hypertension, DM type I, CKD  Additional pertinent review of systems negative.   Current Outpatient Medications:    albuterol (PROVENTIL) (2.5 MG/3ML) 0.083% nebulizer solution, Take 3 mLs (2.5 mg total) by nebulization every 6 (six) hours as needed for wheezing or shortness of breath., Disp: 150 mL, Rfl: 1   albuterol (VENTOLIN HFA) 108 (90 Base) MCG/ACT inhaler, TAKE 2 PUFFS BY MOUTH EVERY 6 HOURS AS NEEDED FOR WHEEZE OR SHORTNESS OF BREATH, Disp: 18 each, Rfl: 2   ammonium lactate (AMLACTIN) 12 % lotion, Apply 1 Application topically as needed for dry skin., Disp: 400 g, Rfl: 0   Budeson-Glycopyrrol-Formoterol (BREZTRI AEROSPHERE) 160-9-4.8 MCG/ACT AERO, Inhale 2 puffs into the lungs 2 (two) times daily., Disp: , Rfl:    Cariprazine HCl (VRAYLAR) 4.5 MG CAPS, Take 1 capsule (4.5 mg total) by mouth daily., Disp: 30 capsule, Rfl: 2   carvedilol (COREG)  6.25 MG tablet, Take 6.25 mg by mouth 2 (two) times daily., Disp: , Rfl:    clonazePAM (KLONOPIN) 0.5 MG tablet, Take 1 tablet (0.5 mg total) by mouth every 8 (eight) hours as needed. for anxiety, Disp: 20 tablet, Rfl: 0   FASENRA 30 MG/ML SOSY, Inject into the skin., Disp: , Rfl:    fluticasone (FLONASE) 50 MCG/ACT nasal spray, Place 2 sprays into both nostrils daily. (Patient taking differently: Place 2 sprays into both nostrils daily as needed (seasonal allergies.).), Disp: 16 g, Rfl: 2   furosemide (LASIX) 40 MG tablet, Take 40 mg by mouth every other day. , Disp: , Rfl:    GLUCAGON EMERGENCY 1 MG injection, Inject 1 mg into the skin as needed (hypoglycemia.). , Disp: , Rfl:    glucose blood (ACCU-CHEK GUIDE) test strip, Use to check blood sugar 3 time a day, Disp: 300 strip, Rfl: 4   Insulin Human (INSULIN PUMP) SOLN, Inject 1 each into the skin 3 times daily with meals, bedtime and 2 AM. Novolog: 36-38 units per day, Disp: , Rfl:    insulin lispro (ADMELOG) 100 UNIT/ML injection, Use under the skin according to insulin pump settings. Use up to 150 units per day., Disp: 50 mL, Rfl: 11   insulin lispro (HUMALOG) 100 UNIT/ML injection, Inject up to 150 units daily according to insulin pump settings., Disp: 10 mL, Rfl: 0  Insulin Pen Needle 32G X 4 MM MISC, 1 each by Does not apply route once a week., Disp: 50 each, Rfl: 0   levonorgestrel (MIRENA, 52 MG,) 20 MCG/24HR IUD, 1 each by Intrauterine route once., Disp: , Rfl:    lisdexamfetamine (VYVANSE) 60 MG capsule, Take 1 capsule (60 mg total) by mouth every morning., Disp: 30 capsule, Rfl: 0   losartan (COZAAR) 50 MG tablet, Take 50 mg by mouth daily., Disp: , Rfl:    methylPREDNISolone (MEDROL DOSEPAK) 4 MG TBPK tablet, Take 6 tablets on day 1.  Take 5 tablets on day 2.  Take 4 tablets on day 3.  Take 3 tablets on day 4.  Take 2 tablets on day 5.  Take 1 tablet on day 6., Disp: 21 tablet, Rfl: 0   montelukast (SINGULAIR) 10 MG tablet, Take 1  tablet (10 mg total) by mouth at bedtime., Disp: 90 tablet, Rfl: 1   NOVOLOG 100 UNIT/ML injection, Inject 36-38 Units into the skin daily. Via insulin pump per sliding scale, Disp: , Rfl:    pantoprazole (PROTONIX) 40 MG tablet, Take 1 tablet (40 mg total) by mouth daily., Disp: 30 tablet, Rfl: 3   pravastatin (PRAVACHOL) 20 MG tablet, SMARTSIG:1 Tablet(s) By Mouth Every Evening, Disp: , Rfl:    promethazine (PHENERGAN) 25 MG tablet, Take 1 tablet (25 mg total) by mouth every 8 (eight) hours as needed for nausea or vomiting., Disp: 30 tablet, Rfl: 0   terbinafine (LAMISIL) 250 MG tablet, Take 1 tablet (250 mg total) by mouth daily., Disp: 90 tablet, Rfl: 0   tirzepatide (MOUNJARO) 5 MG/0.5ML Pen, Inject 5 mg into the skin once a week., Disp: 6 mL, Rfl: 1   tirzepatide (MOUNJARO) 7.5 MG/0.5ML Pen, Inject 7.5 mg into the skin once a week., Disp: 2 mL, Rfl: 0   tiZANidine (ZANAFLEX) 4 MG tablet, Take 4 mg by mouth 2 (two) times daily as needed for muscle spasms. , Disp: , Rfl:    Ubrogepant (UBRELVY) 100 MG TABS, Take 1 tablet (100 mg total) by mouth daily as needed (migraine headaches.)., Disp: 30 tablet, Rfl: 3   Objective:     There were no vitals filed for this visit.    There is no height or weight on file to calculate BMI.    Physical Exam:    ***   Electronically signed by:  [name] D.[facility] 7:07 AM 03/23/23

## 2023-03-26 ENCOUNTER — Ambulatory Visit (INDEPENDENT_AMBULATORY_CARE_PROVIDER_SITE_OTHER): Payer: Medicare Other

## 2023-03-26 ENCOUNTER — Ambulatory Visit (INDEPENDENT_AMBULATORY_CARE_PROVIDER_SITE_OTHER): Payer: Medicare Other | Admitting: Sports Medicine

## 2023-03-26 VITALS — BP 130/80 | HR 87 | Ht 66.0 in | Wt 201.0 lb

## 2023-03-26 DIAGNOSIS — M7062 Trochanteric bursitis, left hip: Secondary | ICD-10-CM | POA: Diagnosis not present

## 2023-03-26 DIAGNOSIS — G8929 Other chronic pain: Secondary | ICD-10-CM

## 2023-03-26 DIAGNOSIS — M25552 Pain in left hip: Secondary | ICD-10-CM | POA: Diagnosis not present

## 2023-03-26 DIAGNOSIS — M5442 Lumbago with sciatica, left side: Secondary | ICD-10-CM | POA: Diagnosis not present

## 2023-03-27 ENCOUNTER — Encounter: Payer: Self-pay | Admitting: Psychiatry

## 2023-03-27 ENCOUNTER — Ambulatory Visit (INDEPENDENT_AMBULATORY_CARE_PROVIDER_SITE_OTHER): Payer: Medicare Other | Admitting: Psychiatry

## 2023-03-27 DIAGNOSIS — F902 Attention-deficit hyperactivity disorder, combined type: Secondary | ICD-10-CM

## 2023-03-27 DIAGNOSIS — F3162 Bipolar disorder, current episode mixed, moderate: Secondary | ICD-10-CM | POA: Diagnosis not present

## 2023-03-27 DIAGNOSIS — G2581 Restless legs syndrome: Secondary | ICD-10-CM

## 2023-03-27 DIAGNOSIS — F4001 Agoraphobia with panic disorder: Secondary | ICD-10-CM

## 2023-03-27 DIAGNOSIS — F5081 Binge eating disorder: Secondary | ICD-10-CM

## 2023-03-27 DIAGNOSIS — F5105 Insomnia due to other mental disorder: Secondary | ICD-10-CM

## 2023-03-27 DIAGNOSIS — F411 Generalized anxiety disorder: Secondary | ICD-10-CM | POA: Diagnosis not present

## 2023-03-27 DIAGNOSIS — F430 Acute stress reaction: Secondary | ICD-10-CM

## 2023-03-27 MED ORDER — VRAYLAR 4.5 MG PO CAPS
1.0000 | ORAL_CAPSULE | Freq: Every day | ORAL | 2 refills | Status: DC
Start: 2023-03-27 — End: 2023-05-28

## 2023-03-27 MED ORDER — SERTRALINE HCL 100 MG PO TABS
ORAL_TABLET | ORAL | 0 refills | Status: DC
Start: 2023-03-27 — End: 2023-05-28

## 2023-03-27 MED ORDER — CLONAZEPAM 0.5 MG PO TABS
0.5000 mg | ORAL_TABLET | Freq: Three times a day (TID) | ORAL | 0 refills | Status: DC | PRN
Start: 2023-03-27 — End: 2023-07-13

## 2023-03-27 NOTE — Progress Notes (Signed)
Kaylianna Lango [id number] November 12, 1985 37 y.o.    Subjective:   Patient ID:  [name] is a 37 y.o. (DOB 08/22/1986) female.  Chief Complaint:  Chief Complaint  Patient presents with   Follow-up   Depression   Anxiety   ADD   Sleeping Problem   Medication Reaction    Depression        Associated symptoms include appetite change.  Past medical history includes anxiety.   Anxiety Symptoms include nervous/anxious behavior. Patient reports no nausea or palpitations.     Sharmell Meis presents to the office today for follow-up of bipolar disorder and generalized anxiety disorder. Historically seen with [name].  When seen June 05, 2019.  The following changes were made: Change pramipexole ER and increase to 0.75 mg pm to rid nausea and improve mood in the afternoon.  For tiredness in the afternoon switch Equetro to 400 mg AM , 1@ 6 and 1 at 11. For panic in daytime start gabapentin 300 mg each am as preventative.  She's only taking it prn now. We continued unchanged Latuda 120 mg and lamotrigine 300 mg twice daily for bipolar disorder. She just got the ER pramipexole about a week ago.  Can't tell difference with nausea DT  gastroparesis worse lately with constipation Less afternoon tiredness with current split Equetro dose.    seen July 22, 2019.  Meds were not changed at that visit.  She called back to October 20 wanting to try valproate because of racing thoughts and difficulty sleeping.  Stating the trazodone was not working.  She was told it was okay to start Depakote ER 500 mg nightly and then gradually increased to 1500 mg nightly.  She called back again on October 6 and this physician noted the following: RTC  Pt called to report new med she started having a lot of side effects mood swings, sounds in ears, suicidal thoughts, tremors. Causing issues with husband She is currently taking Depakote ER 1500 mg nightly. She commits to  safety.  The only symptom directly attributable to Depakote would be the tremor.  However its presence would indicate she is not going to be able to tolerate a higher dose of Depakote to control her mood symptoms.  Therefore we will wean the Depakote.  In order to control the mood symptoms we will increase the Equetro at the evening dose which should not cause significant side effects problems during the day but help with mood stability.  This increase should also make it easier to wean the Depakote quickly.  She agrees to this plan.  She will call back if the suicidal thoughts become too intense to tolerate.  Reduce Depakote ER 500 mg tablets to 1 a night for 4 nights then stop Increase Equetro 200 mg capsules to 2 in the morning and 3 nightly.  [name], MD, DFAPA  She's been back on Equetro and doing OK. 2 weeks leading to Xmas with a lot of anger and lashing out a good bit.  Had run out of mirapex and Latuda for a couple of days in that time frame.  Crying is less now.  No SE CBZ now.  A lot of migraines off her Trokendi bc risk kidney stones.  Plans Botox for HA.  Usually sleeping OK without trazodone.   Still taking Latuda, buspirone, gabapentin, lamotrigine 300 mg BID.    seen 12.30.21 without med change.    12/17/19 appt with the following noted: Since then increased gabapentin  for anxiety to 600 BID-TID which helped but caused some tiredness.  Needing trazodone more bc EMA/EFA.  She's stopped caffeine.  Mood stability is getting better despite a lot of stress. $ stress and racing thoughts at night and scattered some with stressors.  Depression is mild lately.  Took trip with H last week helped.  Coming up on 2 year anniversary of B's death.  B diabetic died last year with DM and kidney failure. Not as much anger irritability.  Some mood swings early FEB but better now. Custody battle with exH.  He's nicer to her now.   Weight going up. Hates weight gain effects of meds.    Consistent with  meds.  No sig SI other than fleeting rarely.  Scattered concentration and racing thoughts randomly.   Sleep OK. Plan: No med changes/ FU 4 mos  06/18/20 TC saying she stopped all psych meds end of August.  06/22/20 appt with the following noted: Had gradually dropped off meds and not sure why.  Stoped Latuda, buspirone, CBZ. Pramipexole., and lamotrigine.  Sleep Ok without trazodone.  Gabapentin less tolerated as prn. Stopped Equetro over a month ago DT insurance.  Back on regular medicare and it might be covered. A lot of stress here lately. Lost father 01/01/20 of MI.  since here and called got propranolol and it hleped crying.  Also got some clonazepam.   Had given up on life and everything.  Not SI today but has been.  No plan.  Depression and mood swings including anger.  Anxiety. She and therapist say she's depressed and recommended IOP at [facility]. Agrees to retry Seroquel which helped in the past Plan: Acute decompensation because of psychiatric noncompliance. quetiapine ER 1 at night for 4 nights, then 2 at night for 4 nights, then 3 at night.  07/13/2020 appointment with the following noted: Patient called in between appointments complaining of quetiapine call causing restless legs.  Gabapentin was called in with instructions to make this side effect resolve. Misunderstood and only taking 300 mg Seroquel XR for a couple of days. Referred to [facility] and has continued therapy. Feels better on Seroquel.  No longer SI.  More stable.  Less anger.  Depression down to mild generally.  Fewer panic attacks. Sleeps with Seroquel 8 hours.  RLS is managed fairly well at this time. Initially too tired with Seroquel and better now. Clonazepam once in 2 weeks for panic driving. Referred to IOP and continued Seroquel ER 600 HS  08/10/20 appt with the following noted: Insurance wouldn't cover enough of IOP.  Still seeing therapist every 2 weeks. Consistent with Seroquel XR 600 daily and needs  gabapentin with it DT RLS. No longer taking propranolol. Taking clonazepam only twice in last month. I feel pretty stable right now.  First Xmas without father.  Feels better than mos ago.  More stable.  Sleep good.  Hangover if late with Seroquel.  Not unusually angry, under control.  Depression is better.  Occ down day.  Function is pretty good with routine things.   Energy is not great esp in the AM Gabapentin will make her feel too drunk if dose is too high. SE Seroquel makes her hungry. Plan: no med changes  09/09/2020 appointment with the following noted: Gabapentin manages the RLS. Compliant with Seroquel XR 600 mg HS. Kind of stressed out and sort of moody.  Mild mood swings with some on edge and agitated easily.  No SI. Kind of down over weight  management visit and lack of progress.  No energy to want to do anything.  Sleep is fine with Seroquel.  In bed at least 8 hours daily. Used Klonopin twice since last visit.  Triggered panic attack last week. Back on Reglan for 2 mos. Plan: Prescription given with schedule to increase to the target dose of Wellbutrin SR 100 mg tablets 2 twice daily and topiramate 25 mg tablets 2 twice daily both to help with weight management and energy and mild depression.  12/16/2020 phone call wanting to stop Seroquel.  02/10/2021 appointment with the following noted: Missed appt here.  She stopped Seroquel on her own gradually and completely about a month ago.  Was taking it off and on.  The gabapentin was interfering with her ability to function.  Wanting to lay down.  But had to take the gabapentin bc Seroquel giving her RLS.  Still very moody but not over the edge like she was in the past. Clonazepam once or twice weekly.  Stopped gabapentin bc no longer has RLS off the Seroquel. No trouble sleeping usually. Feels agitated but not manic.  A lot of stress at home. Lives in camper beside in-laws with conflict over $ with them.  A lot of stress right now and  hurt and anger with them over things. Not many friends to talk to and not a lot of conversation with H.  Feels like needs to restart therapy. Doing video therapy with Christina Hussami. Wellbutrin seemed to help mental state some but didn't help weight much. Plan: Consider retrying Vraylar bc weight gain.  Yes Start 1.5 mg for 1 week then 3 mg daily.  04/14/2021 appointment with the following noted: Seems to be working good with Vraylar 1.5 mg and stopped Wellbutrin. No SE. She started phentermine from Dr. Earlene Plater with Topomax. No SE mania and no sig benefit at 1.2 tablet.  No SE. Sleep normal and not manic. Depression is better with Vraylar but down a couple of times weekly and still a lot of stress at home which affects mood. Mo in law is source of stress bc lives in camper on her property.  She hates me. Won't allow her to use washer.  She's the reason pt needs clonazepam. $ stress.  Ran out of food stamps.  All 5 kids under their care for the summer.  She cooks for M in Social worker but won't help with cost. Disc D's psych provblems Plan no med changes  09/15/2021 appt noted:   Rougher time last few mos. Still seeing therapist. Hand surgery October and got depressed and gained weight and stopped meds for a week or 2.  Couldn't do anything after the surgery for awhile.  Didn't feel family was helping.  Worry over $.  H not working much and she's on disability.  Still living in camper which is falling apart. Has talked to H about helping and he's looking for a job.  Stress is usual cause of her depression and anxiety. Occ panic attacks.  Some isolation lately. ON Vraylar 3 mg daily.  No SE. Meds covered well DT MCR and MCD. M says she can tell when pt misses meds bc gets more emotional and posts on Facebook change.  03/29/22 appt noted: Mostly compliant with Vraylar.  No SE. Stressed last couple of weeks and a little more depressed and irritable. Small things set me off.  Feels depression is a  factor and more tearful.  Stress between she and H and housing problems.  Living in camper for 2 years.  Failed attempt to move. Taking clonazepam 0.5 mg 3-4 times per week.   Therapist Plan: Therefore increase Vraylar to 4.5 mg daily  06/22/2022 appointment noted: Current psych meds are Vraylar 4.5 mg daily, clonazepam 0.5 mg 3 times daily as needed anxiety Clonazepam now only a couple of times per month.   Did move into her own place for a couple of weeks. Kids are really happy alos. Did fine with increase Vraylar and feels it really helped.  Mood good and more stable. No SE. She asked about ADD bc forgets a lot and easily distracted and poor memory at times. For example when cleaning will tend to jump from one task to another.  Always tends to have racing thoughts.  Always tended to want to keep doing things.  Had these sx for years as long as can remember.  Hard to watch TV show bc always feels she has to be doing something. Stayed on task better and better mood with phentermine including less irritability.  Took up to 37.5 mg daily. Still too irritable with kids. Sleep is good usually bc tired when kids go to bed.  DM can interfere with sleep bc nocturia.  Total 6-8 hours of sleep.   Still struggling with weight.   Forgets things kids and H tell her.   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 Earlene Plater and tried vyvanse but out of stock. Anxiety is better than it was.  Still kind of moody a lot and forgetful a lot.  Kids ask her to do things but she forgets. Still on Vryalar 4.5 mg daily wihtout crying or sadness. No Se Plan: For irritability increase Vraylar to 6 mg daily Vyvanse RX  01/03/23 appt noted: Dr. Earlene Plater got her increased to Vyvanse 60 mg AM.   Was out of Vraylar for a couple of weeks DT [facility] problems but back on for 5 days. Not irritable much anymore but is solemn some.  While out of it more depressed. Don't feel a lot of  joy in her life since she was here.   Not needing to take clonazepam bc anxiety is better.   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 He hasn't gotten paid yet so $ stress. Plans to seek marital therapy with pastor. Asks about something to lift her mood. Plan: flat so reduce Vraylar to 4.5  03/27/23 appt noted: Last filled Vyvanse 60 01/04/23.  Adderall XR 20 on 02/2023 by Dr. Earlene Plater bc insurance Uses clonazepam 0.5 mg about once per week.  More anxiety over the last month. Some worry and panic.  Last week vacation.  Anxiety thinking of random things like being too high in hotel.  Occ panic weekly affecting chest and SOB.  Both triggered and spontaneious. Reduced Vraylar to 4.5 and still feels flat.  Not a lot of joy in her life without change since last visist. Don't smile much.   Compliant.  More sad lately. Kids just got out of school and a lot of changes next year.   Worry over kids and D dep. Stress thinking of leaving H.  Cries a lot.  Remarried April 2020.  38 yo D & 38 yo D ADD .  One of the kids father has ADHD Disability review approved mostly for psych reasons.  Going to [facility] weight management and just started vitamin D. Level 24.5.  Past Psychiatric Medication Trials:  lamotrigine 300 twice daily, Equetro 900 daily,  Depakote 1500 SE,  History topiramate for migraine Vraylar 6,  Abilify 15,  lithium felt slowed down,  Seroquel worked but stopped DT insurance and SE RLS and hunger,  Latuda 120, Sertraline NR, duloxetine,  Wellbutrin 300 min effect gabapentin 600 mg 3 times daily tiredness,  buspirone 30 twice daily,,  clonazepam, trazodone, Xanax , hydroxyzine NR, Hx phentermine seemed to help energy and mood.  Didn't help wt loss.  History of suicidal ideation and about 5 suicide attempts with about 4 psychiatric hospitalizations  ADHD SRS : inattentive 17, hyperactive 32= total 49, high  Review of Systems:  Review of Systems   Constitutional:  Positive for appetite change and unexpected weight change.  Cardiovascular:  Negative for palpitations.  Gastrointestinal:  Negative for nausea and vomiting.  Musculoskeletal:  Positive for arthralgias.       Recent hand surgery healing  Psychiatric/Behavioral:  Negative for agitation and sleep disturbance. The patient is nervous/anxious.     Medications: I have reviewed the patient's current medications.  Current Outpatient Medications  Medication Sig Dispense Refill   albuterol (PROVENTIL) (2.5 MG/3ML) 0.083% nebulizer solution Take 3 mLs (2.5 mg total) by nebulization every 6 (six) hours as needed for wheezing or shortness of breath. 150 mL 1   albuterol (VENTOLIN HFA) 108 (90 Base) MCG/ACT inhaler TAKE 2 PUFFS BY MOUTH EVERY 6 HOURS AS NEEDED FOR WHEEZE OR SHORTNESS OF BREATH 18 each 2   ammonium lactate (AMLACTIN) 12 % lotion Apply 1 Application topically as needed for dry skin. 400 g 0   Budeson-Glycopyrrol-Formoterol (BREZTRI AEROSPHERE) 160-9-4.8 MCG/ACT AERO Inhale 2 puffs into the lungs 2 (two) times daily.     carvedilol (COREG) 6.25 MG tablet Take 6.25 mg by mouth 2 (two) times daily.     FASENRA 30 MG/ML SOSY Inject into the skin.     fluticasone (FLONASE) 50 MCG/ACT nasal spray Place 2 sprays into both nostrils daily. (Patient taking differently: Place 2 sprays into both nostrils daily as needed (seasonal allergies.).) 16 g 2   furosemide (LASIX) 40 MG tablet Take 40 mg by mouth every other day.      GLUCAGON EMERGENCY 1 MG injection Inject 1 mg into the skin as needed (hypoglycemia.).      glucose blood (ACCU-CHEK GUIDE) test strip Use to check blood sugar 3 time a day 300 strip 4   Insulin Human (INSULIN PUMP) SOLN Inject 1 each into the skin 3 times daily with meals, bedtime and 2 AM. Novolog: 36-38 units per day     insulin lispro (ADMELOG) 100 UNIT/ML injection Use under the skin according to insulin pump settings. Use up to 150 units per day. 50 mL 11    insulin lispro (HUMALOG) 100 UNIT/ML injection Inject up to 150 units daily according to insulin pump settings. 10 mL 0   Insulin Pen Needle 32G X 4 MM MISC 1 each by Does not apply route once a week. 50 each 0   levonorgestrel (MIRENA, 52 MG,) 20 MCG/24HR IUD 1 each by Intrauterine route once.     lisdexamfetamine (VYVANSE) 60 MG capsule Take 1 capsule (60 mg total) by mouth every morning. 30 capsule 0   losartan (COZAAR) 50 MG tablet Take 50 mg by mouth daily.     methylPREDNISolone (MEDROL DOSEPAK) 4 MG TBPK tablet Take 6 tablets on day 1.  Take 5 tablets on day 2.  Take 4 tablets on day 3.  Take 3 tablets on day 4.  Take 2 tablets on day 5.  Take 1 tablet on day 6. 21 tablet 0   montelukast (SINGULAIR) 10 MG tablet Take 1 tablet (10 mg total) by mouth at bedtime. 90 tablet 1   NOVOLOG 100 UNIT/ML injection Inject 36-38 Units into the skin daily. Via insulin pump per sliding scale     pantoprazole (PROTONIX) 40 MG tablet Take 1 tablet (40 mg total) by mouth daily. 30 tablet 3   pravastatin (PRAVACHOL) 20 MG tablet SMARTSIG:1 Tablet(s) By Mouth Every Evening     promethazine (PHENERGAN) 25 MG tablet Take 1 tablet (25 mg total) by mouth every 8 (eight) hours as needed for nausea or vomiting. 30 tablet 0   sertraline (ZOLOFT) 100 MG tablet 1/2 tab for 2 weeks then 1 tablet daily 90 tablet 0   terbinafine (LAMISIL) 250 MG tablet Take 1 tablet (250 mg total) by mouth daily. 90 tablet 0   tirzepatide (MOUNJARO) 5 MG/0.5ML Pen Inject 5 mg into the skin once a week. 6 mL 1   tirzepatide (MOUNJARO) 7.5 MG/0.5ML Pen Inject 7.5 mg into the skin once a week. 2 mL 0   tiZANidine (ZANAFLEX) 4 MG tablet Take 4 mg by mouth 2 (two) times daily as needed for muscle spasms.      Ubrogepant (UBRELVY) 100 MG TABS Take 1 tablet (100 mg total) by mouth daily as needed (migraine headaches.). 30 tablet 3   Cariprazine HCl (VRAYLAR) 4.5 MG CAPS Take 1 capsule (4.5 mg total) by mouth daily. 30 capsule 2   clonazePAM  (KLONOPIN) 0.5 MG tablet Take 1 tablet (0.5 mg total) by mouth every 8 (eight) hours as needed. for anxiety 20 tablet 0   No current facility-administered medications for this visit.    Medication Side Effects: tired in the afternoon.  Allergies:  Allergies  Allergen Reactions   Nsaids Other (See Comments)    Due to Renal disease   Other    Toradol [Ketorolac Tromethamine]     Cannot take d/t kidney issues    Ceftin Rash    Past Medical History:  Diagnosis Date   Anemia    Anemia of chronic disease 06/07/2012   Formatting of this note might be different from the original. Hematology consultation 07-13-16 (see note)--> Anemia of chronic disease Required 3 transfusions during pregnancy  Last Assessment & Plan:  Formatting of this note might be different from the original. She has now required 3 blood transfusions this pregnancy for her anemia of chronic disease. The most recent was one week ago when she bec   Anxiety    Asthma    Asthma 11/10/2013   Back pain    Bipolar 1 disorder (HCC)    Chest pain    Chronic renal failure syndrome, stage 3 (moderate) (HCC)    Colles' fracture of left radius 10/11/2013   Complication of anesthesia    Constipation    Depression    Depression    Diabetic gastroparesis (HCC)    Diabetic neuropathy, type I diabetes mellitus (HCC)    Diabetic ulcer of right great toe (HCC) 08/27/2020   Edema, lower extremity    Gallbladder disease    Gastroparesis    GERD (gastroesophageal reflux disease)    Headache(784.0)    Hypertension    Joint pain    Kidney stones    Palpitations    Polyneuropathy in diabetes(357.2)    PONV (postoperative nausea and vomiting)    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)    S/P carpal tunnel release left 10/16/19 11/04/2019   Shortness of breath    Tachycardia    baseline tachycardia    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)     Family History  Problem Relation Age of Onset    Diabetes Mother        type 2   Hypertension Mother    Obesity Mother    Diabetes Father    Hypertension Father    High Cholesterol Father    Sleep apnea Father    Obesity Father    Heart disease Father    Heart attack Father    Diabetes Brother        type 1   Renal Disease Brother        renal failure   Hypertension Brother    Hypertension Maternal Grandmother    Breast cancer Maternal Aunt 64   Colon cancer Maternal Aunt    Uterine cancer Maternal Aunt 23   Breast cancer Other    Rectal cancer Neg Hx    Esophageal cancer Neg Hx     Social History   Socioeconomic History   Marital status: Married    Spouse name: Not on file   Number of children: 2   Years of education: 12   Highest education level: 12th grade  Occupational History   Occupation: Arts development officer  Tobacco Use   Smoking status: Never   Smokeless tobacco: Never  Vaping Use   Vaping Use: Never used  Substance and Sexual Activity   Alcohol use: No   Drug use: No   Sexual activity: Yes    Partners: Male    Birth control/protection: I.U.D.    Comment: Mirena  Other Topics Concern   Not on file  Social History Narrative   Pt has a daughter and boyfriend.    Dad passed away suddenly heart attack 12/2019   Social Determinants of Health   Financial Resource Strain: Medium Risk (12/07/2022)   Overall Financial Resource Strain (CARDIA)    Difficulty of Paying Living Expenses: Somewhat hard  Food Insecurity: Food Insecurity Present (12/07/2022)   Hunger Vital Sign    Worried About Running Out of Food in the Last Year: Sometimes true    Ran Out of Food in the Last Year: Sometimes true  Transportation Needs: No Transportation Needs (12/07/2022)   PRAPARE - Administrator, Civil Service (Medical): No    Lack of Transportation (Non-Medical): No  Physical Activity: Insufficiently Active (12/07/2022)   Exercise Vital Sign    Days of Exercise per Week: 2 days    Minutes of Exercise per Session: 30 min   Stress: No Stress Concern Present (12/07/2022)   [employer] of Occupational Health - Occupational Stress Questionnaire    Feeling of Stress : Only a little  Social Connections: Moderately Integrated (12/07/2022)   Social Connection and Isolation Panel [NHANES]    Frequency of Communication with Friends and Family: More than three times a week    Frequency of Social Gatherings with Friends and Family: Once a week    Attends Religious Services: More than 4 times per year    Active Member of [employer] or Organizations: No    Attends Banker Meetings: Never    Marital Status: Married  Catering manager Violence: Not At Risk (12/07/2022)   Humiliation, Afraid, Rape, and Kick questionnaire    Fear of Current or Ex-Partner: No  Emotionally Abused: No    Physically Abused: No    Sexually Abused: No    Past Medical History, Surgical history, Social history, and Family history were reviewed and updated as appropriate.   Please see review of systems for further details on the patient's review from today.   Objective:   Physical Exam:  There were no vitals taken for this visit.  Physical Exam Constitutional:      General: She is not in acute distress. Musculoskeletal:        General: No deformity.  Neurological:     Mental Status: She is alert and oriented to person, place, and time.     Cranial Nerves: No dysarthria.     Coordination: Coordination normal.  Psychiatric:        Attention and Perception: Attention and perception normal. She does not perceive auditory or visual hallucinations.        Mood and Affect: Mood is anxious. Mood is not depressed. Affect is not labile, blunt, angry or inappropriate.        Speech: Speech normal.        Behavior: Behavior normal. Behavior is cooperative.        Thought Content: Thought content normal. Thought content is not paranoid or delusional. Thought content does not include homicidal or suicidal ideation. Thought content does  not include suicidal plan.        Cognition and Memory: Cognition and memory normal.        Judgment: Judgment normal.     Comments: Insight intact Not sig Irritable  depressed but feels flat     Lab Review:     Component Value Date/Time   NA 137 10/19/2022 1457   NA 137 04/04/2021 1250   K 5.0 10/19/2022 1457   CL 102 10/19/2022 1457   CO2 25 10/19/2022 1457   GLUCOSE 325 (H) 10/19/2022 1457   BUN 15 10/19/2022 1457   BUN 18 04/04/2021 1250   CREATININE 1.16 (H) 10/19/2022 1457   CALCIUM 9.4 10/19/2022 1457   PROT 6.3 10/19/2022 1457   PROT 7.1 04/04/2021 1250   ALBUMIN 4.5 04/04/2021 1250   AST 11 10/19/2022 1457   ALT 13 10/19/2022 1457   ALKPHOS 155 (H) 04/04/2021 1250   BILITOT 0.4 10/19/2022 1457   BILITOT 0.4 04/04/2021 1250   GFRNONAA 43 (L) 07/29/2020 1246   GFRNONAA 36 (L) 06/17/2018 1231   GFRAA 50 (L) 07/29/2020 1246   GFRAA 42 (L) 06/17/2018 1231       Component Value Date/Time   WBC 7.6 10/19/2022 1457   RBC 4.83 10/19/2022 1457   HGB 14.0 10/19/2022 1457   HGB 15.2 04/04/2021 1250   HCT 42.6 10/19/2022 1457   HCT 45.8 04/04/2021 1250   PLT 283 10/19/2022 1457   PLT 287 04/04/2021 1250   MCV 88.2 10/19/2022 1457   MCV 87 04/04/2021 1250   MCH 29.0 10/19/2022 1457   MCHC 32.9 10/19/2022 1457   RDW 12.2 10/19/2022 1457   RDW 12.4 04/04/2021 1250   LYMPHSABS 2,014 10/19/2022 1457   LYMPHSABS 2.4 04/04/2021 1250   MONOABS 0.8 10/14/2019 0920   EOSABS 0 (L) 10/19/2022 1457   EOSABS 0.3 04/04/2021 1250   BASOSABS 8 10/19/2022 1457   BASOSABS 0.1 04/04/2021 1250    No results found for: "POCLITH", "LITHIUM"   No results found for: "PHENYTOIN", "PHENOBARB", "VALPROATE", "CBMZ"   .res Assessment: Plan:    Greisy was seen today for follow-up, depression, anxiety, add, sleeping problem and  medication reaction.  Diagnoses and all orders for this visit:  Bipolar 1 disorder, mixed, moderate (HCC) -     sertraline (ZOLOFT) 100 MG tablet; 1/2 tab  for 2 weeks then 1 tablet daily -     Cariprazine HCl (VRAYLAR) 4.5 MG CAPS; Take 1 capsule (4.5 mg total) by mouth daily.  Generalized anxiety disorder  Attention deficit hyperactivity disorder (ADHD), combined type  Panic disorder with agoraphobia -     sertraline (ZOLOFT) 100 MG tablet; 1/2 tab for 2 weeks then 1 tablet daily -     clonazePAM (KLONOPIN) 0.5 MG tablet; Take 1 tablet (0.5 mg total) by mouth every 8 (eight) hours as needed. for anxiety  Insomnia due to mental condition  Binge-eating disorder, moderate  Restless leg syndrome, controlled  Acute stress reaction -     clonazePAM (KLONOPIN) 0.5 MG tablet; Take 1 tablet (0.5 mg total) by mouth every 8 (eight) hours as needed. for anxiety    30 min non face to face time with patient was spent on counseling and coordination of care. We discussed Patient with a history of multiple med failures and history of psychiatric hospitalization and suicide attempts recent worsening of bipolar depression.  Chronic stressors contribute to mood and anxiety problems.   Concern about potential weight gain with medications.  She has taken the major mood stabilizers with low weight gain potential. Discussed the pros and cons of switching mood stabilizers.  Could consider ultra low-dose lithium.     Ongoing chronic stressful situation living in a camper with her children and husband in a very small space.  Discussed what realistically meds can help and what they cannot help.   Continue Vraylar to 4.5 mg daily For dep and panic resume trial Zoloft 50 mg daily for 2 weeks then 100 mg daily for panic and dep off label. Disc risk mania.   Disc poss of ADD but risk ADD meds causing mood swings.  She reports hx mood benefit with phentermine but minimal wt loss.   ADD scale today  I'm not comfortable using short acting stimulants in this pt.  Shortage of Vyvanse at this time but consider.  This could help binge eating and cognitive complaints.     Still seeing Dr. Earlene Plater for wt loss.    OK trial of Vyvanse for BED and ADD.  Will try to get it again. ADHD SRS : inattentive 17, hyperactive 32= total 49, high Rx Vyvanse 40 mg AM  Use clonazepam rarely for benefit from Stimulant.  Discussed potential metabolic side effects associated with atypical antipsychotics, as well as potential risk for movement side effects. Advised pt to contact office if movement side effects occur.    RLS Not a problem now.  Disc the large bill here and not making payments.  Follow-up 2 months  [name], MD, DFAPA   Please see After Visit Summary for patient specific instructions.  Future Appointments  Date Time Provider Department Center  04/16/2023 10:30 AM Richardean Sale, DO [facility] None  04/19/2023 10:45 AM Vivi Barrack, DPM [facility]  04/20/2023  8:00 AM Croitoru, Rachelle Hora, MD [facility] None    No orders of the defined types were placed in this encounter.   -------------------------------

## 2023-04-16 ENCOUNTER — Ambulatory Visit: Payer: Medicare Other | Admitting: Sports Medicine

## 2023-04-16 NOTE — Progress Notes (Deleted)
[name] [facility] [address] [place] [address] Phone: [phone]   Assessment and Plan:     There are no diagnoses linked to this encounter.  ***   Pertinent previous records reviewed include ***   Follow Up: ***     Subjective:   I,  , am serving as a Neurosurgeon for Doctor Richardean Sale   Chief Complaint: leg pain    HPI:    02/28/2023 Patient is a 37 year old female complaining of leg pain. Patient states that she had left pain for years, she was dx with bursitis, she used to get CSI but the pain has changed, she had pain when sitting for long time, and then has pain when trying to walk after sitting , pain radiates to the back of the leg, sleep is hard she is a side sleeper, no numbness or tingling, tylenol doesn't help with the pain, she states she used to hear hip pop but doesn't do that stretching anymore that would cause the popping, she is TTP in that area , she states she can feel a knot    03/26/2023 Patient states she had some pain while on vacation. Back pain has been more frequent when she is in a sitting position for a long period of time. Left hip has been killing her  04/16/2023 Patient states   Relevant Historical Information: Hypertension, DM type I, CKD    Additional pertinent review of systems negative.   Current Outpatient Medications:    albuterol (PROVENTIL) (2.5 MG/3ML) 0.083% nebulizer solution, Take 3 mLs (2.5 mg total) by nebulization every 6 (six) hours as needed for wheezing or shortness of breath., Disp: 150 mL, Rfl: 1   albuterol (VENTOLIN HFA) 108 (90 Base) MCG/ACT inhaler, TAKE 2 PUFFS BY MOUTH EVERY 6 HOURS AS NEEDED FOR WHEEZE OR SHORTNESS OF BREATH, Disp: 18 each, Rfl: 2   ammonium lactate (AMLACTIN) 12 % lotion, Apply 1 Application topically as needed for dry skin., Disp: 400 g, Rfl: 0   Budeson-Glycopyrrol-Formoterol (BREZTRI AEROSPHERE) 160-9-4.8 MCG/ACT AERO, Inhale 2  puffs into the lungs 2 (two) times daily., Disp: , Rfl:    Cariprazine HCl (VRAYLAR) 4.5 MG CAPS, Take 1 capsule (4.5 mg total) by mouth daily., Disp: 30 capsule, Rfl: 2   carvedilol (COREG) 6.25 MG tablet, Take 6.25 mg by mouth 2 (two) times daily., Disp: , Rfl:    clonazePAM (KLONOPIN) 0.5 MG tablet, Take 1 tablet (0.5 mg total) by mouth every 8 (eight) hours as needed. for anxiety, Disp: 20 tablet, Rfl: 0   FASENRA 30 MG/ML SOSY, Inject into the skin., Disp: , Rfl:    fluticasone (FLONASE) 50 MCG/ACT nasal spray, Place 2 sprays into both nostrils daily. (Patient taking differently: Place 2 sprays into both nostrils daily as needed (seasonal allergies.).), Disp: 16 g, Rfl: 2   furosemide (LASIX) 40 MG tablet, Take 40 mg by mouth every other day. , Disp: , Rfl:    GLUCAGON EMERGENCY 1 MG injection, Inject 1 mg into the skin as needed (hypoglycemia.). , Disp: , Rfl:    glucose blood (ACCU-CHEK GUIDE) test strip, Use to check blood sugar 3 time a day, Disp: 300 strip, Rfl: 4   Insulin Human (INSULIN PUMP) SOLN, Inject 1 each into the skin 3 times daily with meals, bedtime and 2 AM. Novolog: 36-38 units per day, Disp: , Rfl:    insulin lispro (ADMELOG) 100 UNIT/ML injection, Use under the skin according to insulin  pump settings. Use up to 150 units per day., Disp: 50 mL, Rfl: 11   insulin lispro (HUMALOG) 100 UNIT/ML injection, Inject up to 150 units daily according to insulin pump settings., Disp: 10 mL, Rfl: 0   Insulin Pen Needle 32G X 4 MM MISC, 1 each by Does not apply route once a week., Disp: 50 each, Rfl: 0   levonorgestrel (MIRENA, 52 MG,) 20 MCG/24HR IUD, 1 each by Intrauterine route once., Disp: , Rfl:    lisdexamfetamine (VYVANSE) 60 MG capsule, Take 1 capsule (60 mg total) by mouth every morning., Disp: 30 capsule, Rfl: 0   losartan (COZAAR) 50 MG tablet, Take 50 mg by mouth daily., Disp: , Rfl:    methylPREDNISolone (MEDROL DOSEPAK) 4 MG TBPK tablet, Take 6 tablets on day 1.  Take 5  tablets on day 2.  Take 4 tablets on day 3.  Take 3 tablets on day 4.  Take 2 tablets on day 5.  Take 1 tablet on day 6., Disp: 21 tablet, Rfl: 0   montelukast (SINGULAIR) 10 MG tablet, Take 1 tablet (10 mg total) by mouth at bedtime., Disp: 90 tablet, Rfl: 1   NOVOLOG 100 UNIT/ML injection, Inject 36-38 Units into the skin daily. Via insulin pump per sliding scale, Disp: , Rfl:    pantoprazole (PROTONIX) 40 MG tablet, Take 1 tablet (40 mg total) by mouth daily., Disp: 30 tablet, Rfl: 3   pravastatin (PRAVACHOL) 20 MG tablet, SMARTSIG:1 Tablet(s) By Mouth Every Evening, Disp: , Rfl:    promethazine (PHENERGAN) 25 MG tablet, Take 1 tablet (25 mg total) by mouth every 8 (eight) hours as needed for nausea or vomiting., Disp: 30 tablet, Rfl: 0   sertraline (ZOLOFT) 100 MG tablet, 1/2 tab for 2 weeks then 1 tablet daily, Disp: 90 tablet, Rfl: 0   terbinafine (LAMISIL) 250 MG tablet, Take 1 tablet (250 mg total) by mouth daily., Disp: 90 tablet, Rfl: 0   tirzepatide (MOUNJARO) 5 MG/0.5ML Pen, Inject 5 mg into the skin once a week., Disp: 6 mL, Rfl: 1   tirzepatide (MOUNJARO) 7.5 MG/0.5ML Pen, Inject 7.5 mg into the skin once a week., Disp: 2 mL, Rfl: 0   tiZANidine (ZANAFLEX) 4 MG tablet, Take 4 mg by mouth 2 (two) times daily as needed for muscle spasms. , Disp: , Rfl:    Ubrogepant (UBRELVY) 100 MG TABS, Take 1 tablet (100 mg total) by mouth daily as needed (migraine headaches.)., Disp: 30 tablet, Rfl: 3   Objective:     There were no vitals filed for this visit.    There is no height or weight on file to calculate BMI.    Physical Exam:    ***   Electronically signed by:  [name] [facility] 7:19 AM 04/16/23

## 2023-04-16 NOTE — Progress Notes (Unsigned)
[name] [facility] [address] [place] [address] Phone: [phone]   Assessment and Plan:     There are no diagnoses linked to this encounter.  ***   Pertinent previous records reviewed include ***   Follow Up: ***     Subjective:   I, [name], am serving as a Neurosurgeon for Doctor Richardean Sale   Chief Complaint: leg pain    HPI:    02/28/2023 Patient is a 37 year old female complaining of leg pain. Patient states that she had left pain for years, she was dx with bursitis, she used to get CSI but the pain has changed, she had pain when sitting for long time, and then has pain when trying to walk after sitting , pain radiates to the back of the leg, sleep is hard she is a side sleeper, no numbness or tingling, tylenol doesn't help with the pain, she states she used to hear hip pop but doesn't do that stretching anymore that would cause the popping, she is TTP in that area , she states she can feel a knot    03/26/2023 Patient states she had some pain while on vacation. Back pain has been more frequent when she is in a sitting position for a long period of time. Left hip has been killing her  04/17/2023 Patient states   Relevant Historical Information: Hypertension, DM type I, CKD  Additional pertinent review of systems negative.   Current Outpatient Medications:    albuterol (PROVENTIL) (2.5 MG/3ML) 0.083% nebulizer solution, Take 3 mLs (2.5 mg total) by nebulization every 6 (six) hours as needed for wheezing or shortness of breath., Disp: 150 mL, Rfl: 1   albuterol (VENTOLIN HFA) 108 (90 Base) MCG/ACT inhaler, TAKE 2 PUFFS BY MOUTH EVERY 6 HOURS AS NEEDED FOR WHEEZE OR SHORTNESS OF BREATH, Disp: 18 each, Rfl: 2   ammonium lactate (AMLACTIN) 12 % lotion, Apply 1 Application topically as needed for dry skin., Disp: 400 g, Rfl: 0   Budeson-Glycopyrrol-Formoterol (BREZTRI AEROSPHERE) 160-9-4.8 MCG/ACT AERO, Inhale 2 puffs  into the lungs 2 (two) times daily., Disp: , Rfl:    Cariprazine HCl (VRAYLAR) 4.5 MG CAPS, Take 1 capsule (4.5 mg total) by mouth daily., Disp: 30 capsule, Rfl: 2   carvedilol (COREG) 6.25 MG tablet, Take 6.25 mg by mouth 2 (two) times daily., Disp: , Rfl:    clonazePAM (KLONOPIN) 0.5 MG tablet, Take 1 tablet (0.5 mg total) by mouth every 8 (eight) hours as needed. for anxiety, Disp: 20 tablet, Rfl: 0   FASENRA 30 MG/ML SOSY, Inject into the skin., Disp: , Rfl:    fluticasone (FLONASE) 50 MCG/ACT nasal spray, Place 2 sprays into both nostrils daily. (Patient taking differently: Place 2 sprays into both nostrils daily as needed (seasonal allergies.).), Disp: 16 g, Rfl: 2   furosemide (LASIX) 40 MG tablet, Take 40 mg by mouth every other day. , Disp: , Rfl:    GLUCAGON EMERGENCY 1 MG injection, Inject 1 mg into the skin as needed (hypoglycemia.). , Disp: , Rfl:    glucose blood (ACCU-CHEK GUIDE) test strip, Use to check blood sugar 3 time a day, Disp: 300 strip, Rfl: 4   Insulin Human (INSULIN PUMP) SOLN, Inject 1 each into the skin 3 times daily with meals, bedtime and 2 AM. Novolog: 36-38 units per day, Disp: , Rfl:    insulin lispro (ADMELOG) 100 UNIT/ML injection, Use under the skin according to insulin pump settings.  Use up to 150 units per day., Disp: 50 mL, Rfl: 11   insulin lispro (HUMALOG) 100 UNIT/ML injection, Inject up to 150 units daily according to insulin pump settings., Disp: 10 mL, Rfl: 0   Insulin Pen Needle 32G X 4 MM MISC, 1 each by Does not apply route once a week., Disp: 50 each, Rfl: 0   levonorgestrel (MIRENA, 52 MG,) 20 MCG/24HR IUD, 1 each by Intrauterine route once., Disp: , Rfl:    lisdexamfetamine (VYVANSE) 60 MG capsule, Take 1 capsule (60 mg total) by mouth every morning., Disp: 30 capsule, Rfl: 0   losartan (COZAAR) 50 MG tablet, Take 50 mg by mouth daily., Disp: , Rfl:    methylPREDNISolone (MEDROL DOSEPAK) 4 MG TBPK tablet, Take 6 tablets on day 1.  Take 5 tablets on  day 2.  Take 4 tablets on day 3.  Take 3 tablets on day 4.  Take 2 tablets on day 5.  Take 1 tablet on day 6., Disp: 21 tablet, Rfl: 0   montelukast (SINGULAIR) 10 MG tablet, Take 1 tablet (10 mg total) by mouth at bedtime., Disp: 90 tablet, Rfl: 1   NOVOLOG 100 UNIT/ML injection, Inject 36-38 Units into the skin daily. Via insulin pump per sliding scale, Disp: , Rfl:    pantoprazole (PROTONIX) 40 MG tablet, Take 1 tablet (40 mg total) by mouth daily., Disp: 30 tablet, Rfl: 3   pravastatin (PRAVACHOL) 20 MG tablet, SMARTSIG:1 Tablet(s) By Mouth Every Evening, Disp: , Rfl:    promethazine (PHENERGAN) 25 MG tablet, Take 1 tablet (25 mg total) by mouth every 8 (eight) hours as needed for nausea or vomiting., Disp: 30 tablet, Rfl: 0   sertraline (ZOLOFT) 100 MG tablet, 1/2 tab for 2 weeks then 1 tablet daily, Disp: 90 tablet, Rfl: 0   terbinafine (LAMISIL) 250 MG tablet, Take 1 tablet (250 mg total) by mouth daily., Disp: 90 tablet, Rfl: 0   tirzepatide (MOUNJARO) 5 MG/0.5ML Pen, Inject 5 mg into the skin once a week., Disp: 6 mL, Rfl: 1   tirzepatide (MOUNJARO) 7.5 MG/0.5ML Pen, Inject 7.5 mg into the skin once a week., Disp: 2 mL, Rfl: 0   tiZANidine (ZANAFLEX) 4 MG tablet, Take 4 mg by mouth 2 (two) times daily as needed for muscle spasms. , Disp: , Rfl:    Ubrogepant (UBRELVY) 100 MG TABS, Take 1 tablet (100 mg total) by mouth daily as needed (migraine headaches.)., Disp: 30 tablet, Rfl: 3   Objective:     There were no vitals filed for this visit.    There is no height or weight on file to calculate BMI.    Physical Exam:    ***   Electronically signed by:  [name] [facility] 12:29 PM 04/16/23

## 2023-04-17 ENCOUNTER — Ambulatory Visit (INDEPENDENT_AMBULATORY_CARE_PROVIDER_SITE_OTHER): Payer: Medicare Other | Admitting: Sports Medicine

## 2023-04-17 VITALS — BP 110/72 | HR 88 | Ht 66.0 in | Wt 185.0 lb

## 2023-04-17 DIAGNOSIS — M25552 Pain in left hip: Secondary | ICD-10-CM

## 2023-04-17 DIAGNOSIS — M7062 Trochanteric bursitis, left hip: Secondary | ICD-10-CM | POA: Diagnosis not present

## 2023-04-17 DIAGNOSIS — M5442 Lumbago with sciatica, left side: Secondary | ICD-10-CM | POA: Diagnosis not present

## 2023-04-17 DIAGNOSIS — G8929 Other chronic pain: Secondary | ICD-10-CM

## 2023-04-17 NOTE — Patient Instructions (Signed)
Pt referral  Voltaren gel over areas of pain  2 month follow up

## 2023-04-19 ENCOUNTER — Ambulatory Visit: Payer: Medicare Other | Admitting: Podiatry

## 2023-04-20 ENCOUNTER — Encounter: Payer: Self-pay | Admitting: Cardiovascular Disease

## 2023-04-20 ENCOUNTER — Ambulatory Visit: Payer: Medicare Other | Attending: Cardiovascular Disease | Admitting: Cardiovascular Disease

## 2023-04-20 VITALS — BP 128/84 | HR 83 | Ht 66.0 in | Wt 184.6 lb

## 2023-04-20 DIAGNOSIS — R079 Chest pain, unspecified: Secondary | ICD-10-CM | POA: Insufficient documentation

## 2023-04-20 DIAGNOSIS — E108 Type 1 diabetes mellitus with unspecified complications: Secondary | ICD-10-CM

## 2023-04-20 DIAGNOSIS — I25118 Atherosclerotic heart disease of native coronary artery with other forms of angina pectoris: Secondary | ICD-10-CM | POA: Diagnosis not present

## 2023-04-20 DIAGNOSIS — Z794 Long term (current) use of insulin: Secondary | ICD-10-CM

## 2023-04-20 DIAGNOSIS — E1159 Type 2 diabetes mellitus with other circulatory complications: Secondary | ICD-10-CM | POA: Insufficient documentation

## 2023-04-20 DIAGNOSIS — N1832 Chronic kidney disease, stage 3b: Secondary | ICD-10-CM

## 2023-04-20 DIAGNOSIS — I152 Hypertension secondary to endocrine disorders: Secondary | ICD-10-CM | POA: Diagnosis present

## 2023-04-20 DIAGNOSIS — E7800 Pure hypercholesterolemia, unspecified: Secondary | ICD-10-CM | POA: Insufficient documentation

## 2023-04-20 NOTE — Patient Instructions (Signed)
Medication Instructions:  No changes *If you need a refill on your cardiac medications before your next appointment, please call your pharmacy*  Testing/Procedures: How to Prepare for Your Cardiac PET/CT Stress Test:  1. Please do not take these medications before your test:   Medications that may interfere with the cardiac pharmacological stress agent (ex. nitrates - including erectile dysfunction medications, isosorbide mononitrate, tamulosin or beta-blockers) the day of the exam. (Erectile dysfunction medication should be held for at least 72 hrs prior to test) Theophylline containing medications for 12 hours. Dipyridamole 48 hours prior to the test. Your remaining medications may be taken with water.  2. Nothing to eat or drink, except water, 3 hours prior to arrival time.   NO caffeine/decaffeinated products, or chocolate 12 hours prior to arrival.  3. NO perfume, cologne or lotion  4. Total time is 1 to 2 hours; you may want to bring reading material for the waiting time.  5. Please report to Radiology at the [facility] Main Entrance 30 minutes early for your test.  [address] [place], [place] [address]  6. Please report to Radiology at [facility] Main Entrance, medical mall, 30 mins prior to your test.  [address]  [place], [place]  [phone]  Diabetic Preparation:  Hold oral medications. You may take NPH and Lantus insulin. Do not take Humalog or Humulin R (Regular Insulin) the day of your test. Check blood sugars prior to leaving the house. If able to eat breakfast prior to 3 hour fasting, you may take all medications, including your insulin, Do not worry if you miss your breakfast dose of insulin - start at your next meal.  IF YOU THINK YOU MAY BE PREGNANT, OR ARE NURSING PLEASE INFORM THE TECHNOLOGIST.  In preparation for your appointment, medication and supplies will be purchased.  Appointment availability is  limited, so if you need to cancel or reschedule, please call the Radiology Department at [phone] [facility]) OR [phone] [facility])  24 hours in advance to avoid a cancellation fee of $100.00  What to Expect After you Arrive:  Once you arrive and check in for your appointment, you will be taken to a preparation room within the Radiology Department.  A technologist or Nurse will obtain your medical history, verify that you are correctly prepped for the exam, and explain the procedure.  Afterwards,  an IV will be started in your arm and electrodes will be placed on your skin for EKG monitoring during the stress portion of the exam. Then you will be escorted to the PET/CT scanner.  There, staff will get you positioned on the scanner and obtain a blood pressure and EKG.  During the exam, you will continue to be connected to the EKG and blood pressure machines.  A small, safe amount of a radioactive tracer will be injected in your IV to obtain a series of pictures of your heart along with an injection of a stress agent.    After your Exam:  It is recommended that you eat a meal and drink a caffeinated beverage to counter act any effects of the stress agent.  Drink plenty of fluids for the remainder of the day and urinate frequently for the first couple of hours after the exam.  Your doctor will inform you of your test results within 7-10 business days.  For more information and frequently asked questions, please visit our website : [email or web address]  For questions about your test or  how to prepare for your test, please call: Rockwell Alexandria, Cardiac Imaging Nurse Navigator  Larey Brick, Cardiac Imaging Nurse Navigator Johney Frame, Cardiac Imaging Nurse Navigator Office: [phone]    Follow-Up: At [facility], you and your health needs are our priority.  As part of our continuing mission to provide you with exceptional heart care, we have created designated  Provider Care Teams.  These Care Teams include your primary Cardiologist (physician) and Advanced Practice Providers (APPs -  Physician Assistants and Nurse Practitioners) who all work together to provide you with the care you need, when you need it.  We recommend signing up for the patient portal called "MyChart".  Sign up information is provided on this After Visit Summary.  MyChart is used to connect with patients for Virtual Visits (Telemedicine).  Patients are able to view lab/test results, encounter notes, upcoming appointments, etc.  Non-urgent messages can be sent to your provider as well.   To learn more about what you can do with MyChart, go to [email or web address].    Your next appointment:   6 month(s)  Provider:   Dr Royann Shivers

## 2023-04-20 NOTE — Progress Notes (Signed)
Cardiology Office Note:  .   Date:  04/20/2023  ID:  [name], DOB 1986-10-04, MRN [id number] PCP: Donita Brooks, MD  [facility] Providers Cardiologist:  None    History of Present Illness: .   [name] is a 37 y.o. female with a history of early onset and nonobstructive CAD on a background of type 1 diabetes mellitus on insulin since age 57 years.  She has a history of preeclampsia with both of her previous pregnancies.  She subsequent developed hypertension, but this has resolved after weight loss.  She also has a history of migraines and asthma.  She is here to establish cardiology follow-up after moving into town from [place].  In early 2022, she was having repeated episodes of chest discomfort.  She "failed a stress test" she developed chest pain while walking on the treadmill) and then underwent cardiac catheterization which showed atherosclerotic plaque with no more than about 20% stenosis in the mid LAD artery.  Her cardiologist at [facility] was Dr. Lester Kinsman.  Her last appointment was in January 2023.  He ordered an echocardiogram at that time, which showed normal findings.  Empirical treatment with PPI led to only temporary benefit.  The chest discomfort is described as a heaviness to the left of her sternum that does not radiate outside of the chest.  It has a tightness quality to it.  It is different from the discomfort she feels in her chest when she is having a panic attack.  She has an insulin pump and a glucose monitoring device.  Her endocrinologist is Dr. Sharl Ma.  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 Vincente Poli and sees an ophthalmologist at [facility] for diabetic retinopathy.  Also bears a diagnosis of diabetic gastroparesis.  Her pulmonologist is Dr. Jerre Simon with [facility].  She has taken numerous antihypertensive medications in the past, especially when she weighed more.  She was  started on carvedilol but had to stop it after she lost weight because she had symptomatic hypotension.  She is on a 25 mg dose of losartan for renal protection from diabetes mellitus.  She remembers taking amlodipine in the past without side effects.  She has never smoked cigarettes.  Her most recent hemoglobin A1c was 9.1% in May, down from 11.4 in April and she is working on improving this further.  Her most recent LDL cholesterol was excellent at 47 and her HDL is also good at 66.  Most recent creatinine was 1.48.  (The range in the last couple of years has been 1.30-1.94, GFR appears to be in the 40-50 range).  Her brother also had type 1 diabetes mellitus.  They were diagnosed within 2 weeks of each other.  Unfortunately her brother developed end-stage renal disease after CT performed with contrast and went on dialysis.  He died after about 2 years on hemodialysis.  Patient's father had a myocardial infarction and died suddenly at age 587.  Her mother has hypertension.  ROS: She has had episodes of chest discomfort lasting 1-2 hours, without obvious precipitant, often at rest, resolving spontaneously.  She denies exertional angina or dyspnea, orthopnea, PND, palpitations, dizziness or syncope, lower extremity edema or claudication, focal neurological complaints.  She has occasional panic attacks.  Studies Reviewed: Marland Kitchen   EKG Interpretation Date/Time:  Friday April 20 2023 08:19:46 EDT Ventricular Rate:  83 PR Interval:  134 QRS Duration:  80 QT Interval:  364  QTC Calculation: 427 R Axis:   49  Text Interpretation: Normal sinus rhythm Normal ECG When compared with ECG of 08-Sep-2019 10:05, No significant change was found Confirmed by Gianina Olinde ([id number]) on 04/20/2023 11:29:25 AM    Echocardiogram 10/13/2021 [facility]  Left Ventricle: Systolic function is normal. EF: 55-60%.    Left Ventricle: Doppler parameters consistent with mild diastolic  dysfunction and low to normal LA pressure.    Normal LVEF  Mild diastolic dysfunction  No valvular pathologies   Cardiac catheterization 11/17/2020  Coronary Angiography  1. Left Main -normal  2. Left anterior descending artery -there is a 10 to 20% mid LAD stenosis.   There is 1 large diagonal artery which is widely patent  3. Left Circumflex -normal.  There is 1 moderate sized obtuse marginal  artery that bifurcates that is widely patent  4. Right Coronary Artery -normal.  The PDA and PLV branches are normal  Additional comments on angiography: Right dominant    Risk Assessment/Calculations:             Physical Exam:   VS:  BP 128/84 (BP Location: Left Arm, Patient Position: Sitting, Cuff Size: Normal)   Pulse 83   Ht 5\' 6"  (1.676 m)   Wt 184 lb 9.6 oz (83.7 kg)   SpO2 98%   BMI 29.80 kg/m    Wt Readings from Last 3 Encounters:  04/20/23 184 lb 9.6 oz (83.7 kg)  04/17/23 185 lb (83.9 kg)  03/26/23 201 lb (91.2 kg)    GEN: Overweight, well developed in no acute distress NECK: No JVD; No carotid bruits CARDIAC: RRR, no murmurs, rubs, gallops RESPIRATORY:  Clear to auscultation without rales, wheezing or rhonchi  ABDOMEN: Soft, non-tender, non-distended EXTREMITIES:  No edema; No deformity   ASSESSMENT AND PLAN: .    CAD: She has age of advanced atherosclerosis likely related to type 1 diabetes mellitus, but had angiography performed 2-1/2 years ago she did not have any obstructive lesions.  Her current symptoms are not associated with activity, but otherwise do have characteristics suggestive of angina pectoris.  Need to get some type of evaluation for worsening coronary artery disease.  She is very reluctant to undergo CT angiography or cardiac catheterization due to the risk of worsening kidney function.  Will schedule for a nuclear PET scan (preferable to a SPECT scan due to the low risk of attenuation artifacts, especially in view of her body habitus and likelihood of dense breasts.  If this comes back without  ischemia, question possible coronary vasospasm or endothelial dysfunction in the setting of type 1 diabetes mellitus for over 30 years.  May not do well with nitrates due to her tendency to have migraines.  We could try a low-dose of amlodipine in case she has coronary vasospasm.  It is reasonable to continue low-dose aspirin. HLP: Excellent lipid profile I will continue the current low-dose of pravastatin. DM: Complicated by retinopathy and nephropathy/CKD.  Recently has had improvement in her hemoglobin A1c but is not yet at target.  She has clearly benefited from the addition of Mounjaro to her insulin even though she is a type I diabetic.  She has lost substantial weight. HTN: Started off with preeclampsia during pregnancies.  Her blood pressure has normalized with weight loss.  She is only on a small dose of losartan prescribed for nephro protection. CKD 3: Based on most recent labs GFR is right around 45.  Desirable blood pressure less than 130/80 and hemoglobin A1c less  than 7%.  Avoid contrast based procedures unless absolutely necessary.  Avoid NSAIDs and other nephrotoxic medications.  He is very sensitive to the issue of worsening kidney function in view of her brothers history.    Informed Consent   Shared Decision Making/Informed Consent The risks [chest pain, shortness of breath, cardiac arrhythmias, dizziness, blood pressure fluctuations, myocardial infarction, stroke/transient ischemic attack, nausea, vomiting, allergic reaction, radiation exposure, metallic taste sensation and life-threatening complications (estimated to be 1 in 10,000)], benefits (risk stratification, diagnosing coronary artery disease, treatment guidance) and alternatives of a nuclear stress test were discussed in detail with Ms. Chanda and she agrees to proceed.     Dispo: Follow-up after PET scan  Signed, Thurmon Fair, MD

## 2023-05-15 ENCOUNTER — Ambulatory Visit: Payer: Medicare Other | Admitting: Podiatry

## 2023-05-15 DIAGNOSIS — L600 Ingrowing nail: Secondary | ICD-10-CM

## 2023-05-15 DIAGNOSIS — B351 Tinea unguium: Secondary | ICD-10-CM

## 2023-05-15 DIAGNOSIS — L84 Corns and callosities: Secondary | ICD-10-CM | POA: Diagnosis not present

## 2023-05-15 DIAGNOSIS — E1149 Type 2 diabetes mellitus with other diabetic neurological complication: Secondary | ICD-10-CM | POA: Diagnosis not present

## 2023-05-15 NOTE — Progress Notes (Signed)
Subjective: No chief complaint on file.  37 year old female presents the office today for diabetic foot evaluation.  Overall she said that she is doing better.  She recently noticed however on the left third toe that she previously had some drainage.  She was trimming her nails she pulled off part of her nail.  It did bleed after.  Does not hurt.  Is getting better.  She is currently on doxycycline for UTI.  Toenails have been doing better with the Lamisil.  She had no side effects of medication.  Last A1c 9.3 on 11/02/2022 but missed last appt; getting lows  Objective: AAO x3, NAD DP/PT pulses palpable bilaterally, CRT less than 3 seconds Minimal callus formation present bilaterally.  There is no underlying ulceration drainage or signs of infection.  The distal aspect the left third toe there is some dried blood, the nail was removed and there is very minimal edema and erythema around the nail itself.  There is no ascending cellulitis.  No drainage or pus.  No fluctuation or crepitation.  There is no malodor. Overall color to the nails getting better and they are growing out.  No pain in the other nails. No pain with calf compression, swelling, warmth, erythema  Assessment: Preulcerative calluses, localized erythema left third toenail  Plan: -All treatment options discussed with the patient including all alternatives, risks, complications.  -This appears to due to the calluses without any complications or bleeding of the removal.  Discussed moisturizer daily as well as shoes, good support and offloading. -Regards to the left third toenail continue doxycycline that she just started for her UTI.  Antibiotic ointment.  Monitor for any signs or symptoms of infection.  If not improving will need to have the nail removed.  She will monitor closely and let me know if there is any changes or worsening. -For her to consider Lamisil for now however if symptoms recur we will likely plan on restarting  this. -Daily foot inspection.  Glucose control. -Patient encouraged to call the office with any questions, concerns, change in symptoms.   Vivi Barrack DPM

## 2023-05-16 ENCOUNTER — Ambulatory Visit: Payer: Medicare Other

## 2023-05-25 ENCOUNTER — Encounter (HOSPITAL_COMMUNITY): Payer: Self-pay | Admitting: Emergency Medicine

## 2023-05-25 ENCOUNTER — Encounter (HOSPITAL_COMMUNITY): Payer: Self-pay

## 2023-05-28 ENCOUNTER — Encounter: Payer: Self-pay | Admitting: Psychiatry

## 2023-05-28 ENCOUNTER — Telehealth: Payer: Self-pay | Admitting: Family Medicine

## 2023-05-28 ENCOUNTER — Telehealth (HOSPITAL_COMMUNITY): Payer: Self-pay | Admitting: Emergency Medicine

## 2023-05-28 ENCOUNTER — Ambulatory Visit (INDEPENDENT_AMBULATORY_CARE_PROVIDER_SITE_OTHER): Payer: Medicare Other | Admitting: Psychiatry

## 2023-05-28 ENCOUNTER — Other Ambulatory Visit: Payer: Self-pay | Admitting: Family Medicine

## 2023-05-28 ENCOUNTER — Telehealth (HOSPITAL_COMMUNITY): Payer: Self-pay | Admitting: *Deleted

## 2023-05-28 DIAGNOSIS — F902 Attention-deficit hyperactivity disorder, combined type: Secondary | ICD-10-CM | POA: Diagnosis not present

## 2023-05-28 DIAGNOSIS — F3162 Bipolar disorder, current episode mixed, moderate: Secondary | ICD-10-CM | POA: Diagnosis not present

## 2023-05-28 DIAGNOSIS — G2581 Restless legs syndrome: Secondary | ICD-10-CM

## 2023-05-28 DIAGNOSIS — F411 Generalized anxiety disorder: Secondary | ICD-10-CM | POA: Diagnosis not present

## 2023-05-28 DIAGNOSIS — F5081 Binge eating disorder: Secondary | ICD-10-CM

## 2023-05-28 DIAGNOSIS — F50811 Binge eating disorder, moderate: Secondary | ICD-10-CM

## 2023-05-28 DIAGNOSIS — F4001 Agoraphobia with panic disorder: Secondary | ICD-10-CM

## 2023-05-28 DIAGNOSIS — F5105 Insomnia due to other mental disorder: Secondary | ICD-10-CM

## 2023-05-28 MED ORDER — VRAYLAR 4.5 MG PO CAPS
1.0000 | ORAL_CAPSULE | Freq: Every day | ORAL | 3 refills | Status: DC
Start: 2023-05-28 — End: 2023-07-13

## 2023-05-28 MED ORDER — SERTRALINE HCL 100 MG PO TABS
100.0000 mg | ORAL_TABLET | Freq: Every day | ORAL | 0 refills | Status: DC
Start: 2023-05-28 — End: 2023-07-13

## 2023-05-28 MED ORDER — INSULIN LISPRO 100 UNIT/ML IJ SOLN: INTRAMUSCULAR | 1 refills | Status: DC

## 2023-05-28 NOTE — Progress Notes (Signed)
[name] [id number] [birth year] 37 y.o.    Subjective:   Patient ID:  [name] is a 37 y.o. (DOB 06/01/1986) female.  Chief Complaint:  Chief Complaint  Patient presents with   Follow-up   Depression   Anxiety   ADD    Depression        Associated symptoms include appetite change.  Past medical history includes anxiety.   Anxiety Symptoms include nervous/anxious behavior. Patient reports no nausea or palpitations.     [name] presents to the office today for follow-up of bipolar disorder and generalized anxiety disorder. Historically seen with [name].  When seen June 05, 2019.  The following changes were made: Change pramipexole ER and increase to 0.75 mg pm to rid nausea and improve mood in the afternoon.  For tiredness in the afternoon switch Equetro to 400 mg AM , 1@ 6 and 1 at 11. For panic in daytime start gabapentin 300 mg each am as preventative.  She's only taking it prn now. We continued unchanged Latuda 120 mg and lamotrigine 300 mg twice daily for bipolar disorder. She just got the ER pramipexole about a week ago.  Can't tell difference with nausea DT  gastroparesis worse lately with constipation Less afternoon tiredness with current split Equetro dose.    seen July 22, 2019.  Meds were not changed at that visit.  She called back to October 20 wanting to try valproate because of racing thoughts and difficulty sleeping.  Stating the trazodone was not working.  She was told it was okay to start Depakote ER 500 mg nightly and then gradually increased to 1500 mg nightly.  She called back again on October 6 and this physician noted the following: RTC  Pt called to report new med she started having a lot of side effects mood swings, sounds in ears, suicidal thoughts, tremors. Causing issues with husband She is currently taking Depakote ER 1500 mg nightly. She commits to safety.  The only symptom directly attributable  to Depakote would be the tremor.  However its presence would indicate she is not going to be able to tolerate a higher dose of Depakote to control her mood symptoms.  Therefore we will wean the Depakote.  In order to control the mood symptoms we will increase the Equetro at the evening dose which should not cause significant side effects problems during the day but help with mood stability.  This increase should also make it easier to wean the Depakote quickly.  She agrees to this plan.  She will call back if the suicidal thoughts become too intense to tolerate.  Reduce Depakote ER 500 mg tablets to 1 a night for 4 nights then stop Increase Equetro 200 mg capsules to 2 in the morning and 3 nightly.  Cristina Staggers, MD, DFAPA  She's been back on Equetro and doing OK. 2 weeks leading to Xmas with a lot of anger and lashing out a good bit.  Had run out of mirapex and Latuda for a couple of days in that time frame.  Crying is less now.  No SE CBZ now.  A lot of migraines off her Trokendi bc risk kidney stones.  Plans Botox for HA.  Usually sleeping OK without trazodone.   Still taking Latuda, buspirone, gabapentin, lamotrigine 300 mg BID.    seen 12.30.21 without med change.    12/17/19 appt with the following noted: Since then increased gabapentin for anxiety to 600 BID-TID which helped but  caused some tiredness.  Needing trazodone more bc EMA/EFA.  She's stopped caffeine.  Mood stability is getting better despite a lot of stress. $ stress and racing thoughts at night and scattered some with stressors.  Depression is mild lately.  Took trip with H last week helped.  Coming up on 2 year anniversary of B's death.  B diabetic died last year with DM and kidney failure. Not as much anger irritability.  Some mood swings early FEB but better now. Custody battle with exH.  He's nicer to her now.   Weight going up. Hates weight gain effects of meds.    Consistent with meds.  No sig SI other than fleeting rarely.   Scattered concentration and racing thoughts randomly.   Sleep OK. Plan: No med changes/ FU 4 mos  06/18/20 TC saying she stopped all psych meds end of August.  06/22/20 appt with the following noted: Had gradually dropped off meds and not sure why.  Stoped Latuda, buspirone, CBZ. Pramipexole., and lamotrigine.  Sleep Ok without trazodone.  Gabapentin less tolerated as prn. Stopped Equetro over a month ago DT insurance.  Back on regular medicare and it might be covered. A lot of stress here lately. Lost father 01/01/20 of MI.  since here and called got propranolol and it hleped crying.  Also got some clonazepam.   Had given up on life and everything.  Not SI today but has been.  No plan.  Depression and mood swings including anger.  Anxiety. She and therapist say she's depressed and recommended IOP at [facility]. Agrees to retry Seroquel which helped in the past Plan: Acute decompensation because of psychiatric noncompliance. quetiapine ER 1 at night for 4 nights, then 2 at night for 4 nights, then 3 at night.  07/13/2020 appointment with the following noted: Patient called in between appointments complaining of quetiapine call causing restless legs.  Gabapentin was called in with instructions to make this side effect resolve. Misunderstood and only taking 300 mg Seroquel XR for a couple of days. Referred to [facility] and has continued therapy. Feels better on Seroquel.  No longer SI.  More stable.  Less anger.  Depression down to mild generally.  Fewer panic attacks. Sleeps with Seroquel 8 hours.  RLS is managed fairly well at this time. Initially too tired with Seroquel and better now. Clonazepam once in 2 weeks for panic driving. Referred to IOP and continued Seroquel ER 600 HS  08/10/20 appt with the following noted: Insurance wouldn't cover enough of IOP.  Still seeing therapist every 2 weeks. Consistent with Seroquel XR 600 daily and needs gabapentin with it DT RLS. No longer taking  propranolol. Taking clonazepam only twice in last month. I feel pretty stable right now.  First Xmas without father.  Feels better than mos ago.  More stable.  Sleep good.  Hangover if late with Seroquel.  Not unusually angry, under control.  Depression is better.  Occ down day.  Function is pretty good with routine things.   Energy is not great esp in the AM Gabapentin will make her feel too drunk if dose is too high. SE Seroquel makes her hungry. Plan: no med changes  09/09/2020 appointment with the following noted: Gabapentin manages the RLS. Compliant with Seroquel XR 600 mg HS. Kind of stressed out and sort of moody.  Mild mood swings with some on edge and agitated easily.  No SI. Kind of down over weight management visit and lack of progress.  No  energy to want to do anything.  Sleep is fine with Seroquel.  In bed at least 8 hours daily. Used Klonopin twice since last visit.  Triggered panic attack last week. Back on Reglan for 2 mos. Plan: Prescription given with schedule to increase to the target dose of Wellbutrin SR 100 mg tablets 2 twice daily and topiramate 25 mg tablets 2 twice daily both to help with weight management and energy and mild depression.  12/16/2020 phone call wanting to stop Seroquel.  02/10/2021 appointment with the following noted: Missed appt here.  She stopped Seroquel on her own gradually and completely about a month ago.  Was taking it off and on.  The gabapentin was interfering with her ability to function.  Wanting to lay down.  But had to take the gabapentin bc Seroquel giving her RLS.  Still very moody but not over the edge like she was in the past. Clonazepam once or twice weekly.  Stopped gabapentin bc no longer has RLS off the Seroquel. No trouble sleeping usually. Feels agitated but not manic.  A lot of stress at home. Lives in camper beside in-laws with conflict over $ with them.  A lot of stress right now and hurt and anger with them over things. Not  many friends to talk to and not a lot of conversation with H.  Feels like needs to restart therapy. Doing video therapy with Christina Hussami. Wellbutrin seemed to help mental state some but didn't help weight much. Plan: Consider retrying Vraylar bc weight gain.  Yes Start 1.5 mg for 1 week then 3 mg daily.  04/14/2021 appointment with the following noted: Seems to be working good with Vraylar 1.5 mg and stopped Wellbutrin. No SE. She started phentermine from Dr. Earlene Plater with Topomax. No SE mania and no sig benefit at 1.2 tablet.  No SE. Sleep normal and not manic. Depression is better with Vraylar but down a couple of times weekly and still a lot of stress at home which affects mood. Mo in law is source of stress bc lives in camper on her property.  She hates me. Won't allow her to use washer.  She's the reason pt needs clonazepam. $ stress.  Ran out of food stamps.  All 5 kids under their care for the summer.  She cooks for M in Social worker but won't help with cost. Disc D's psych provblems Plan no med changes  09/15/2021 appt noted:   Rougher time last few mos. Still seeing therapist. Hand surgery October and got depressed and gained weight and stopped meds for a week or 2.  Couldn't do anything after the surgery for awhile.  Didn't feel family was helping.  Worry over $.  H not working much and she's on disability.  Still living in camper which is falling apart. Has talked to H about helping and he's looking for a job.  Stress is usual cause of her depression and anxiety. Occ panic attacks.  Some isolation lately. ON Vraylar 3 mg daily.  No SE. Meds covered well DT MCR and MCD. M says she can tell when pt misses meds bc gets more emotional and posts on Facebook change.  03/29/22 appt noted: Mostly compliant with Vraylar.  No SE. Stressed last couple of weeks and a little more depressed and irritable. Small things set me off.  Feels depression is a factor and more tearful.  Stress between she  and H and housing problems.  Living in camper for 2 years.  Failed  attempt to move. Taking clonazepam 0.5 mg 3-4 times per week.   Therapist Plan: Therefore increase Vraylar to 4.5 mg daily  06/22/2022 appointment noted: Current psych meds are Vraylar 4.5 mg daily, clonazepam 0.5 mg 3 times daily as needed anxiety Clonazepam now only a couple of times per month.   Did move into her own place for a couple of weeks. Kids are really happy alos. Did fine with increase Vraylar and feels it really helped.  Mood good and more stable. No SE. She asked about ADD bc forgets a lot and easily distracted and poor memory at times. For example when cleaning will tend to jump from one task to another.  Always tends to have racing thoughts.  Always tended to want to keep doing things.  Had these sx for years as long as can remember.  Hard to watch TV show bc always feels she has to be doing something. Stayed on task better and better mood with phentermine including less irritability.  Took up to 37.5 mg daily. Still too irritable with kids. Sleep is good usually bc tired when kids go to bed.  DM can interfere with sleep bc nocturia.  Total 6-8 hours of sleep.   Still struggling with weight.   Forgets things kids and H tell her.   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 Earlene Plater and tried vyvanse but out of stock. Anxiety is better than it was.  Still kind of moody a lot and forgetful a lot.  Kids ask her to do things but she forgets. Still on Vryalar 4.5 mg daily wihtout crying or sadness. No Se Plan: For irritability increase Vraylar to 6 mg daily Vyvanse RX  01/03/23 appt noted: Dr. Earlene Plater got her increased to Vyvanse 60 mg AM.   Was out of Vraylar for a couple of weeks DT [facility] problems but back on for 5 days. Not irritable much anymore but is solemn some.  While out of it more depressed. Don't feel a lot of joy in her life since she was here.   Not  needing to take clonazepam bc anxiety is better.   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 He hasn't gotten paid yet so $ stress. Plans to seek marital therapy with pastor. Asks about something to lift her mood. Plan: flat so reduce Vraylar to 4.5  03/27/23 appt noted: Last filled Vyvanse 60 01/04/23.  Adderall XR 20 on 02/2023 by Dr. Earlene Plater bc insurance Uses clonazepam 0.5 mg about once per week.  More anxiety over the last month. Some worry and panic.  Last week vacation.  Anxiety thinking of random things like being too high in hotel.  Occ panic weekly affecting chest and SOB.  Both triggered and spontaneous. Reduced Vraylar to 4.5 and still feels flat.  Not a lot of joy in her life without change since last visist. Don't smile much.   Compliant.  More sad lately. Kids just got out of school and a lot of changes next year.   Worry over kids and D dep. Stress thinking of leaving H.  Cries a lot. Plan: Continue Vraylar to 4.5 mg daily For dep and panic resume trial Zoloft 50 mg daily for 2 weeks then 100 mg daily for panic and dep off label.  05/28/23 appt noted: Psych med: Vraylar 4.5, sertraline 100, switched from Vyvanse to Adderall XR 20 AM by Dr.  Wallace Less clonazepam with Zoloft helping.  Better mood and less anxiety with sertraline 100 mg daily.   No sig SE with it.  Still some agitation but not as much. Still happy with Vraylar 4.5 mg less flat than with 6 mg daily.  No sig crying over the last month.  Panic is better but not gone.  Can be spontaneous.    Can occur when overwhelmed and stressed.   Needs to resume therapy [facility]. Stepson 18 is living with them and starting college and not well prepared for this.  He doesn't have license yet.   Gets drop off in the afternoon with switch to Adderall XR 20 mg AM.    Remarried April 2020.  38 yo D & 38 yo D ADD .  One of the kids father has ADHD Disability review approved mostly for  psych reasons.  Going to [facility] weight management and just started vitamin D. Level 24.5.  Past Psychiatric Medication Trials:  lamotrigine 300 twice daily, Equetro 900 daily,  Depakote 1500 SE,  History topiramate for migraine Vraylar 6 flat,  Abilify 15,  lithium felt slowed down,  Seroquel worked but stopped DT insurance and SE RLS and hunger,  Latuda 120, Sertraline 100 benefit  with Vraylar,  duloxetine,  Wellbutrin 300 min effect gabapentin 600 mg 3 times daily tiredness,  buspirone 30 twice daily,,  clonazepam, trazodone, Xanax , hydroxyzine NR, Hx phentermine seemed to help energy and mood.  Didn't help wt loss.  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  Cardiovascular:  Negative for palpitations.  Gastrointestinal:  Negative for nausea and vomiting.  Musculoskeletal:  Positive for arthralgias.       Recent hand surgery healing  Psychiatric/Behavioral:  Negative for agitation, behavioral problems and sleep disturbance. The patient is nervous/anxious.     Medications: I have reviewed the patient's current medications.  Current Outpatient Medications  Medication Sig Dispense Refill   albuterol (PROVENTIL) (2.5 MG/3ML) 0.083% nebulizer solution Take 3 mLs (2.5 mg total) by nebulization every 6 (six) hours as needed for wheezing or shortness of breath. 150 mL 1   albuterol (VENTOLIN HFA) 108 (90 Base) MCG/ACT inhaler TAKE 2 PUFFS BY MOUTH EVERY 6 HOURS AS NEEDED FOR WHEEZE OR SHORTNESS OF BREATH 18 each 2   ammonium lactate (AMLACTIN) 12 % lotion Apply 1 Application topically as needed for dry skin. 400 g 0   amphetamine-dextroamphetamine (ADDERALL) 20 MG tablet Take 20 mg by mouth daily.     Budeson-Glycopyrrol-Formoterol (BREZTRI AEROSPHERE)  160-9-4.8 MCG/ACT AERO Inhale 2 puffs into the lungs 2 (two) times daily.     Cariprazine HCl (VRAYLAR) 4.5 MG CAPS Take 1 capsule (4.5 mg total) by mouth daily. 30 capsule 2   carvedilol (COREG) 6.25 MG tablet Take 6.25 mg by mouth 2 (two) times daily.     clonazePAM (KLONOPIN) 0.5 MG tablet Take 1 tablet (0.5 mg total) by mouth every 8 (eight) hours as needed. for anxiety 20 tablet 0   FASENRA 30 MG/ML SOSY Inject into the skin. Every 3 months     fluticasone (FLONASE) 50 MCG/ACT nasal spray Place 2 sprays into both nostrils daily. (Patient taking differently: Place 2 sprays into both nostrils daily as needed (seasonal allergies.).) 16 g 2   furosemide (  LASIX) 40 MG tablet Take 40 mg by mouth every other day.      GLUCAGON EMERGENCY 1 MG injection Inject 1 mg into the skin as needed (hypoglycemia.).     glucose blood (ACCU-CHEK GUIDE) test strip Use to check blood sugar 3 time a day 300 strip 4   Insulin Human (INSULIN PUMP) SOLN Inject 1 each into the skin 3 times daily with meals, bedtime and 2 AM. Novolog: 36-38 units per day     insulin lispro (ADMELOG) 100 UNIT/ML injection Use under the skin according to insulin pump settings. Use up to 150 units per day. 50 mL 11   insulin lispro (HUMALOG) 100 UNIT/ML injection Inject up to 150 units daily according to insulin pump settings. 10 mL 0   Insulin Pen Needle 32G X 4 MM MISC 1 each by Does not apply route once a week. 50 each 0   levonorgestrel (MIRENA, 52 MG,) 20 MCG/24HR IUD 1 each by Intrauterine route once.     losartan (COZAAR) 25 MG tablet Take 25 mg by mouth daily.     montelukast (SINGULAIR) 10 MG tablet Take 1 tablet (10 mg total) by mouth at bedtime. 90 tablet 1   NOVOLOG 100 UNIT/ML injection Inject 36-38 Units into the skin daily. Via insulin pump per sliding scale     pantoprazole (PROTONIX) 40 MG tablet Take 1 tablet (40 mg total) by mouth daily. 30 tablet 3   pravastatin (PRAVACHOL) 20 MG tablet SMARTSIG:1 Tablet(s) By Mouth Every  Evening     promethazine (PHENERGAN) 25 MG tablet Take 1 tablet (25 mg total) by mouth every 8 (eight) hours as needed for nausea or vomiting. 30 tablet 0   sertraline (ZOLOFT) 100 MG tablet 1/2 tab for 2 weeks then 1 tablet daily (Patient taking differently: Take 100 mg by mouth daily.) 90 tablet 0   tirzepatide (MOUNJARO) 5 MG/0.5ML Pen Inject 5 mg into the skin once a week. 6 mL 1   tiZANidine (ZANAFLEX) 4 MG tablet Take 4 mg by mouth 2 (two) times daily as needed for muscle spasms.     Ubrogepant (UBRELVY) 100 MG TABS Take 1 tablet (100 mg total) by mouth daily as needed (migraine headaches.). 30 tablet 3   No current facility-administered medications for this visit.    Medication Side Effects: tired in the afternoon.  Allergies:  Allergies  Allergen Reactions   Nsaids Other (See Comments)    Due to Renal disease   Other    Toradol [Ketorolac Tromethamine]     Cannot take d/t kidney issues    Ceftin Rash    Past Medical History:  Diagnosis Date   Anemia    Anemia of chronic disease 06/07/2012   Formatting of this note might be different from the original. Hematology consultation 07-13-16 (see note)--> Anemia of chronic disease Required 3 transfusions during pregnancy  Last Assessment & Plan:  Formatting of this note might be different from the original. She has now required 3 blood transfusions this pregnancy for her anemia of chronic disease. The most recent was one week ago when she bec   Anxiety    Asthma    Asthma 11/10/2013   Back pain    Bipolar 1 disorder (HCC)    Chest pain    Chronic renal failure syndrome, stage 3 (moderate) (HCC)    Colles' fracture of left radius 10/11/2013   Complication of anesthesia    Constipation    Depression    Depression  Diabetic gastroparesis (HCC)    Diabetic neuropathy, type I diabetes mellitus (HCC)    Diabetic ulcer of right great toe (HCC) 08/27/2020   Edema, lower extremity    Gallbladder disease    Gastroparesis    GERD  (gastroesophageal reflux disease)    Headache(784.0)    Hypertension    Joint pain    Kidney stones    Palpitations    Polyneuropathy in diabetes(357.2)    PONV (postoperative nausea and vomiting)    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)    S/P carpal tunnel release left 10/16/19 11/04/2019   Shortness of breath    Tachycardia    baseline tachycardia    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)     Family History  Problem Relation Age of Onset   Diabetes Mother        type 2   Hypertension Mother    Obesity Mother    Diabetes Father    Hypertension Father    High Cholesterol Father    Sleep apnea Father    Obesity Father    Heart disease Father    Heart attack Father    Diabetes Brother        type 1   Renal Disease Brother        renal failure   Hypertension Brother    Hypertension Maternal Grandmother    Breast cancer Maternal Aunt 64   Colon cancer Maternal Aunt    Uterine cancer Maternal Aunt 34   Breast cancer Other    Rectal cancer Neg Hx    Esophageal cancer Neg Hx     Social History   Socioeconomic History   Marital status: Married    Spouse name: Not on file   Number of children: 2   Years of education: 12   Highest education level: 12th grade  Occupational History   Occupation: Arts development officer  Tobacco Use   Smoking status: Never   Smokeless tobacco: Never  Vaping Use   Vaping status: Never Used  Substance and Sexual Activity   Alcohol use: No   Drug use: No   Sexual activity: Yes    Partners: Male    Birth control/protection: I.U.D.    Comment: Mirena  Other Topics Concern   Not on file  Social History Narrative   Pt has a daughter and boyfriend.    Dad passed away suddenly heart attack 12/2019   Social Determinants of Health   Financial Resource Strain: Medium Risk (12/07/2022)   Overall Financial Resource Strain (CARDIA)    Difficulty of Paying Living Expenses: Somewhat hard  Food Insecurity: Food  Insecurity Present (12/07/2022)   Hunger Vital Sign    Worried About Running Out of Food in the Last Year: Sometimes true    Ran Out of Food in the Last Year: Sometimes true  Transportation Needs: No Transportation Needs (12/07/2022)   PRAPARE - Administrator, Civil Service (Medical): No    Lack of Transportation (Non-Medical): No  Physical Activity: Insufficiently Active (12/07/2022)   Exercise Vital Sign    Days of Exercise per Week: 2 days    Minutes of Exercise per Session: 30 min  Stress: No Stress Concern Present (12/07/2022)   [employer] of Occupational Health - Occupational Stress Questionnaire    Feeling of Stress : Only a little  Social Connections: Moderately Integrated (12/07/2022)   Social Connection and Isolation Panel [NHANES]    Frequency of Communication with  Friends and Family: More than three times a week    Frequency of Social Gatherings with Friends and Family: Once a week    Attends Religious Services: More than 4 times per year    Active Member of [employer] or Organizations: No    Attends Banker Meetings: Never    Marital Status: Married  Catering manager Violence: Not At Risk (12/07/2022)   Humiliation, Afraid, Rape, and Kick questionnaire    Fear of Current or Ex-Partner: No    Emotionally Abused: No    Physically Abused: No    Sexually Abused: No    Past Medical History, Surgical history, Social history, and Family history were reviewed and updated as appropriate.   Please see review of systems for further details on the patient's review from today.   Objective:   Physical Exam:  There were no vitals taken for this visit.  Physical Exam Constitutional:      General: She is not in acute distress. Musculoskeletal:        General: No deformity.  Neurological:     Mental Status: She is alert and oriented to person, place, and time.     Cranial Nerves: No dysarthria.     Coordination: Coordination normal.  Psychiatric:         Attention and Perception: Attention and perception normal. She does not perceive auditory or visual hallucinations.        Mood and Affect: Mood is anxious. Mood is not depressed. Affect is not labile, blunt, angry, tearful or inappropriate.        Speech: Speech normal.        Behavior: Behavior normal. Behavior is cooperative.        Thought Content: Thought content normal. Thought content is not paranoid or delusional. Thought content does not include homicidal or suicidal ideation. Thought content does not include suicidal plan.        Cognition and Memory: Cognition and memory normal.        Judgment: Judgment normal.     Comments: Insight intact Not sig Irritable  depressed but feels flat     Lab Review:     Component Value Date/Time   NA 137 10/19/2022 1457   NA 137 04/04/2021 1250   K 5.0 10/19/2022 1457   CL 102 10/19/2022 1457   CO2 25 10/19/2022 1457   GLUCOSE 325 (H) 10/19/2022 1457   BUN 15 10/19/2022 1457   BUN 18 04/04/2021 1250   CREATININE 1.16 (H) 10/19/2022 1457   CALCIUM 9.4 10/19/2022 1457   PROT 6.3 10/19/2022 1457   PROT 7.1 04/04/2021 1250   ALBUMIN 4.5 04/04/2021 1250   AST 11 10/19/2022 1457   ALT 13 10/19/2022 1457   ALKPHOS 155 (H) 04/04/2021 1250   BILITOT 0.4 10/19/2022 1457   BILITOT 0.4 04/04/2021 1250   GFRNONAA 43 (L) 07/29/2020 1246   GFRNONAA 36 (L) 06/17/2018 1231   GFRAA 50 (L) 07/29/2020 1246   GFRAA 42 (L) 06/17/2018 1231       Component Value Date/Time   WBC 7.6 10/19/2022 1457   RBC 4.83 10/19/2022 1457   HGB 14.0 10/19/2022 1457   HGB 15.2 04/04/2021 1250   HCT 42.6 10/19/2022 1457   HCT 45.8 04/04/2021 1250   PLT 283 10/19/2022 1457   PLT 287 04/04/2021 1250   MCV 88.2 10/19/2022 1457   MCV 87 04/04/2021 1250   MCH 29.0 10/19/2022 1457   MCHC 32.9 10/19/2022 1457   RDW 12.2 10/19/2022  1457   RDW 12.4 04/04/2021 1250   LYMPHSABS 2,014 10/19/2022 1457   LYMPHSABS 2.4 04/04/2021 1250   MONOABS 0.8 10/14/2019 0920    EOSABS 0 (L) 10/19/2022 1457   EOSABS 0.3 04/04/2021 1250   BASOSABS 8 10/19/2022 1457   BASOSABS 0.1 04/04/2021 1250    No results found for: "POCLITH", "LITHIUM"   No results found for: "PHENYTOIN", "PHENOBARB", "VALPROATE", "CBMZ"   .res Assessment: Plan:    Delima was seen today for follow-up, depression, anxiety and add.  Diagnoses and all orders for this visit:  Bipolar 1 disorder, mixed, moderate (HCC)  Generalized anxiety disorder  Attention deficit hyperactivity disorder (ADHD), combined type  Panic disorder with agoraphobia  Insomnia due to mental condition  Binge-eating disorder, moderate  Restless leg syndrome, controlled     30 min non face to face time with patient was spent on counseling and coordination of care. We discussed Patient with a history of multiple med failures and history of psychiatric hospitalization and suicide attempts recent worsening of bipolar depression.  Chronic stressors contribute to mood and anxiety problems.   Concern about potential weight gain with medications.  She has taken the major mood stabilizers with low weight gain potential. Discussed the pros and cons of switching mood stabilizers.  Could consider ultra low-dose lithium.     Ongoing chronic stressful situation living in a camper with her children and husband in a very small space.  Discussed what realistically meds can help and what they cannot help.   Continue Vraylar to 4.5 mg daily For dep and panic better with Zoloft 100 mg daily for panic and dep off label. Disc risk mania.   Disc poss of ADD but risk ADD meds causing mood swings.  She reports hx mood benefit with phentermine but minimal wt loss.   ADD scale today  I'm not comfortable using short acting stimulants in this pt.  Shortage of Vyvanse at this time but consider.  This could help binge eating and cognitive complaints.    Still seeing Dr. Earlene Plater for wt loss.    OK trial of Vyvanse for BED and ADD.  Will  try to get it again. ADHD SRS : inattentive 16, hyperactive 32= total 48, high Rx Adderall from Dr. Earlene Plater bc insurance won't cover Vyvanse.  Use clonazepam rarely for benefit from Stimulant.  Discussed potential metabolic side effects associated with atypical antipsychotics, as well as potential risk for movement side effects. Advised pt to contact office if movement side effects occur.    RLS Not a problem now.  No med changes today.  Disc the large bill here and not making payments.  She has made a payment since her.  Follow-up 3-4 months  Cristina Staggers, MD, DFAPA   Please see After Visit Summary for patient specific instructions.  Future Appointments  Date Time Provider Department Center  05/29/2023  7:20 AM [facility] PET CT 1 [facility] [place]  06/19/2023 10:00 AM Richardean Sale, DO [facility] None  10/01/2023 10:00 AM Croitoru, Rachelle Hora, MD [facility] None  11/16/2023 10:45 AM Vivi Barrack, DPM [facility]    No orders of the defined types were placed in this encounter.   -------------------------------

## 2023-05-28 NOTE — Telephone Encounter (Signed)
Patient returning call about her upcoming cardiac imaging study; pt verbalizes understanding of appt date/time, parking situation and where to check in, pre-test NPO status and verified current allergies; name and call back number provided for further questions should they arise  Merle Prescott RN Navigator Cardiac Imaging [facility] [phone] office [phone] cell  Patient aware to avoid caffeine 12 hours prior to her cardiac PET scan. 

## 2023-05-28 NOTE — Telephone Encounter (Signed)
Attempted to call patient regarding upcoming cardiac PET appointment. Left message on voicemail with name and callback number Sara Wallace RN Navigator Cardiac Imaging [facility] [phone] Office [phone] Cell  

## 2023-05-28 NOTE — Telephone Encounter (Signed)
Patient called to request a refill of insulin lispro (ADMELOG) 100 UNIT/ML injection   This medication is normally filled by patient's endocrinologist; patient stated she's out of insulin and hasn't been able to contact her endocrinologist all day. Requesting a courtesy refill.  Pharmacy confirmed as :  [facility] [address], [place] - [address] [place] [address] Phone: [phone]  Fax: [phone]   Please advise at [phone].

## 2023-05-29 ENCOUNTER — Encounter (HOSPITAL_COMMUNITY)
Admission: RE | Admit: 2023-05-29 | Discharge: 2023-05-29 | Disposition: A | Discharge status: Home / Self Care | Payer: Medicare Other | Source: Ambulatory Visit | Attending: Cardiovascular Disease | Admitting: Cardiovascular Disease

## 2023-05-29 DIAGNOSIS — I25118 Atherosclerotic heart disease of native coronary artery with other forms of angina pectoris: Secondary | ICD-10-CM | POA: Diagnosis present

## 2023-05-29 DIAGNOSIS — R079 Chest pain, unspecified: Secondary | ICD-10-CM | POA: Diagnosis present

## 2023-05-29 LAB — NM PET CT CARDIAC PERFUSION MULTI W/ABSOLUTE BLOODFLOW
MBFR: 2.81
Nuc Rest EF: 60 %
Nuc Stress EF: 67 %
Rest MBF: 1.35 ml/g/min
Rest Nuclear Isotope Dose: 21.8 mCi
ST Depression (mm): 0 mm
Stress MBF: 3.79 ml/g/min
Stress Nuclear Isotope Dose: 21.9 mCi
TID: 1.03

## 2023-05-29 MED ORDER — REGADENOSON 0.4 MG/5ML IV SOLN
INTRAVENOUS | Status: AC
  Filled 2023-05-29: qty 5

## 2023-05-29 MED ORDER — RUBIDIUM RB82 GENERATOR (RUBYFILL)
25.0000 | PACK | Freq: Once | INTRAVENOUS | Status: AC
  Administered 2023-05-29: 21.81 via INTRAVENOUS

## 2023-05-29 MED ORDER — REGADENOSON 0.4 MG/5ML IV SOLN
0.4000 mg | Freq: Once | INTRAVENOUS | Status: AC
  Administered 2023-05-29: 0.4 mg via INTRAVENOUS

## 2023-05-29 MED ORDER — RUBIDIUM RB82 GENERATOR (RUBYFILL)
25.0000 | PACK | Freq: Once | INTRAVENOUS | Status: AC
  Administered 2023-05-29: 21.9 via INTRAVENOUS

## 2023-05-29 NOTE — Progress Notes (Signed)
Pt reports needing to use her inhaler multiple times last night. Resting HR high.  Pt give diet coke after exam  Pt tolerated exam without incident; vital signs within normal limits; pt denies lightheadedness or dizziness; encouraged to drink caffeine, eat meal; pt ambulatory to lobby steady gait noted  [place] RN Navigator Cardiac Imaging [facility] [phone] Office  [phone] Cell

## 2023-06-19 ENCOUNTER — Ambulatory Visit: Payer: Medicare Other | Admitting: Sports Medicine

## 2023-06-19 ENCOUNTER — Ambulatory Visit: Payer: Medicare Other | Admitting: Physical Therapy

## 2023-06-19 NOTE — Progress Notes (Unsigned)
[name] D.[facility] [address] [place] [address] Phone: [phone]   Assessment and Plan:     There are no diagnoses linked to this encounter.  ***   Pertinent previous records reviewed include ***   Follow Up: ***     Subjective:   I, Paras Kreider, am serving as a Neurosurgeon for Doctor Richardean Sale   Chief Complaint: leg pain    HPI:    02/28/2023 Patient is a 37 year old female complaining of leg pain. Patient states that she had left pain for years, she was dx with bursitis, she used to get CSI but the pain has changed, she had pain when sitting for long time, and then has pain when trying to walk after sitting , pain radiates to the back of the leg, sleep is hard she is a side sleeper, no numbness or tingling, tylenol doesn't help with the pain, she states she used to hear hip pop but doesn't do that stretching anymore that would cause the popping, she is TTP in that area , she states she can feel a knot    03/26/2023 Patient states she had some pain while on vacation. Back pain has been more frequent when she is in a sitting position for a long period of time. Left hip has been killing her   04/17/2023 Patient states after CSI pain calmed down . Went swimming yesterday so she is a little flared   06/20/2023 Patient states    Relevant Historical Information: Hypertension, DM type I, CKD  Additional pertinent review of systems negative.   Current Outpatient Medications:    albuterol (PROVENTIL) (2.5 MG/3ML) 0.083% nebulizer solution, Take 3 mLs (2.5 mg total) by nebulization every 6 (six) hours as needed for wheezing or shortness of breath., Disp: 150 mL, Rfl: 1   albuterol (VENTOLIN HFA) 108 (90 Base) MCG/ACT inhaler, TAKE 2 PUFFS BY MOUTH EVERY 6 HOURS AS NEEDED FOR WHEEZE OR SHORTNESS OF BREATH, Disp: 18 each, Rfl: 2   ammonium lactate (AMLACTIN) 12 % lotion, Apply 1 Application topically as needed for dry skin.,  Disp: 400 g, Rfl: 0   amphetamine-dextroamphetamine (ADDERALL) 20 MG tablet, Take 20 mg by mouth daily., Disp: , Rfl:    Budeson-Glycopyrrol-Formoterol (BREZTRI AEROSPHERE) 160-9-4.8 MCG/ACT AERO, Inhale 2 puffs into the lungs 2 (two) times daily., Disp: , Rfl:    Cariprazine HCl (VRAYLAR) 4.5 MG CAPS, Take 1 capsule (4.5 mg total) by mouth daily., Disp: 30 capsule, Rfl: 3   carvedilol (COREG) 6.25 MG tablet, Take 6.25 mg by mouth 2 (two) times daily., Disp: , Rfl:    clonazePAM (KLONOPIN) 0.5 MG tablet, Take 1 tablet (0.5 mg total) by mouth every 8 (eight) hours as needed. for anxiety, Disp: 20 tablet, Rfl: 0   FASENRA 30 MG/ML SOSY, Inject into the skin. Every 3 months, Disp: , Rfl:    fluticasone (FLONASE) 50 MCG/ACT nasal spray, Place 2 sprays into both nostrils daily. (Patient taking differently: Place 2 sprays into both nostrils daily as needed (seasonal allergies.).), Disp: 16 g, Rfl: 2   furosemide (LASIX) 40 MG tablet, Take 40 mg by mouth every other day. , Disp: , Rfl:    GLUCAGON EMERGENCY 1 MG injection, Inject 1 mg into the skin as needed (hypoglycemia.)., Disp: , Rfl:    glucose blood (ACCU-CHEK GUIDE) test strip, Use to check blood sugar 3 time a day, Disp: 300 strip, Rfl: 4   Insulin Human (INSULIN PUMP) SOLN,  Inject 1 each into the skin 3 times daily with meals, bedtime and 2 AM. Novolog: 36-38 units per day, Disp: , Rfl:    insulin lispro (ADMELOG) 100 UNIT/ML injection, Use under the skin according to insulin pump settings. Use up to 150 units per day., Disp: 50 mL, Rfl: 1   insulin lispro (HUMALOG) 100 UNIT/ML injection, Inject up to 150 units daily according to insulin pump settings., Disp: 10 mL, Rfl: 0   Insulin Pen Needle 32G X 4 MM MISC, 1 each by Does not apply route once a week., Disp: 50 each, Rfl: 0   levonorgestrel (MIRENA, 52 MG,) 20 MCG/24HR IUD, 1 each by Intrauterine route once., Disp: , Rfl:    losartan (COZAAR) 25 MG tablet, Take 25 mg by mouth daily., Disp: , Rfl:     montelukast (SINGULAIR) 10 MG tablet, Take 1 tablet (10 mg total) by mouth at bedtime., Disp: 90 tablet, Rfl: 1   NOVOLOG 100 UNIT/ML injection, Inject 36-38 Units into the skin daily. Via insulin pump per sliding scale, Disp: , Rfl:    pantoprazole (PROTONIX) 40 MG tablet, Take 1 tablet (40 mg total) by mouth daily., Disp: 30 tablet, Rfl: 3   pravastatin (PRAVACHOL) 20 MG tablet, SMARTSIG:1 Tablet(s) By Mouth Every Evening, Disp: , Rfl:    promethazine (PHENERGAN) 25 MG tablet, Take 1 tablet (25 mg total) by mouth every 8 (eight) hours as needed for nausea or vomiting., Disp: 30 tablet, Rfl: 0   sertraline (ZOLOFT) 100 MG tablet, Take 1 tablet (100 mg total) by mouth daily., Disp: 90 tablet, Rfl: 0   tirzepatide (MOUNJARO) 5 MG/0.5ML Pen, Inject 5 mg into the skin once a week., Disp: 6 mL, Rfl: 1   tiZANidine (ZANAFLEX) 4 MG tablet, Take 4 mg by mouth 2 (two) times daily as needed for muscle spasms., Disp: , Rfl:    Ubrogepant (UBRELVY) 100 MG TABS, Take 1 tablet (100 mg total) by mouth daily as needed (migraine headaches.)., Disp: 30 tablet, Rfl: 3   Objective:     There were no vitals filed for this visit.    There is no height or weight on file to calculate BMI.    Physical Exam:    ***   Electronically signed by:  [name] D.[facility] 7:05 AM 06/19/23

## 2023-06-20 ENCOUNTER — Ambulatory Visit (INDEPENDENT_AMBULATORY_CARE_PROVIDER_SITE_OTHER): Payer: Medicare Other | Admitting: Sports Medicine

## 2023-06-20 VITALS — BP 120/78 | HR 108 | Ht 66.0 in | Wt 175.0 lb

## 2023-06-20 DIAGNOSIS — G8929 Other chronic pain: Secondary | ICD-10-CM

## 2023-06-20 DIAGNOSIS — M5442 Lumbago with sciatica, left side: Secondary | ICD-10-CM

## 2023-06-20 DIAGNOSIS — M7062 Trochanteric bursitis, left hip: Secondary | ICD-10-CM | POA: Diagnosis not present

## 2023-06-20 DIAGNOSIS — M25552 Pain in left hip: Secondary | ICD-10-CM

## 2023-06-20 NOTE — Patient Instructions (Signed)
Left hip MRI  Follow up 4-5 days after to discuss results

## 2023-06-21 ENCOUNTER — Ambulatory Visit: Payer: Medicare Other | Admitting: Rehabilitative and Restorative Service Providers"

## 2023-07-03 ENCOUNTER — Other Ambulatory Visit: Payer: Medicare Other

## 2023-07-03 ENCOUNTER — Ambulatory Visit (INDEPENDENT_AMBULATORY_CARE_PROVIDER_SITE_OTHER): Payer: Medicare Other | Admitting: Sports Medicine

## 2023-07-03 ENCOUNTER — Ambulatory Visit: Payer: Medicare Other

## 2023-07-03 ENCOUNTER — Ambulatory Visit (INDEPENDENT_AMBULATORY_CARE_PROVIDER_SITE_OTHER): Payer: Medicare Other

## 2023-07-03 DIAGNOSIS — G8929 Other chronic pain: Secondary | ICD-10-CM

## 2023-07-03 DIAGNOSIS — M25552 Pain in left hip: Secondary | ICD-10-CM

## 2023-07-03 DIAGNOSIS — M5442 Lumbago with sciatica, left side: Secondary | ICD-10-CM | POA: Diagnosis not present

## 2023-07-03 DIAGNOSIS — M7062 Trochanteric bursitis, left hip: Secondary | ICD-10-CM

## 2023-07-03 MED ORDER — GADOBUTROL 1 MMOL/ML IV SOLN
1.0000 mL | Freq: Once | INTRAVENOUS | Status: AC | PRN
  Administered 2023-07-03: 1 mL

## 2023-07-03 NOTE — Assessment & Plan Note (Signed)
Injection performed today for MR arthrography, further management per primary treating provider.

## 2023-07-03 NOTE — Progress Notes (Signed)
    Procedures performed today:    Procedure: Real-time Ultrasound Guided gadolinium contrast injection of left hip joint Device: Samsung [id number]  Verbal informed consent obtained.  Time-out conducted.  Noted no overlying erythema, induration, or other signs of local infection.  Skin prepped in a sterile fashion.  Local anesthesia: Topical Ethyl chloride.  With sterile technique and under real time ultrasound guidance: 22-gauge spinal needle advanced to the femoral head/neck junction, I did see some synovitis, I injected 1 cc kenalog 40, 2 cc lidocaine, 2 cc bupivacaine, syringe switched and 0.1 cc gadolinium injected, syringe again switched and 10 cc sterile saline used to fully distend the joint. Joint visualized and capsule seen distending confirming intra-articular placement of contrast material and medication. Completed without difficulty  Advised to call if fevers/chills, erythema, induration, drainage, or persistent bleeding.  Images permanently stored in PACS Impression: Technically successful ultrasound guided gadolinium contrast injection for MR arthrography.  Please see separate MR arthrogram report.  Independent interpretation of notes and tests performed by another provider:   None.  Brief History, Exam, Impression, and Recommendations:    Chronic left hip pain Injection performed today for MR arthrography, further management per primary treating provider.    ____________________________________________ Ihor Austin. Benjamin Stain, M.D., ABFM., CAQSM., AME. Primary Care and Sports Medicine [facility]  Adjunct Professor of Family Medicine  [employer]  Restaurant manager, fast food

## 2023-07-06 NOTE — Progress Notes (Deleted)
[name] D.[name] [address] [place] [address] Phone: [phone]   Assessment and Plan:     There are no diagnoses linked to this encounter.  ***   Pertinent previous records reviewed include ***   Follow Up: ***     Subjective:   I, [name], am serving as a Neurosurgeon for Doctor Richardean Sale   Chief Complaint: leg pain    HPI:    02/28/2023 Patient is a 37 year old female complaining of leg pain. Patient states that she had left pain for years, she was dx with bursitis, she used to get CSI but the pain has changed, she had pain when sitting for long time, and then has pain when trying to walk after sitting , pain radiates to the back of the leg, sleep is hard she is a side sleeper, no numbness or tingling, tylenol doesn't help with the pain, she states she used to hear hip pop but doesn't do that stretching anymore that would cause the popping, she is TTP in that area , she states she can feel a knot    03/26/2023 Patient states she had some pain while on vacation. Back pain has been more frequent when she is in a sitting position for a long period of time. Left hip has been killing her   04/17/2023 Patient states after CSI pain calmed down . Went swimming yesterday so she is a little flared    06/20/2023 Patient states past couple of weeks leg pain has flared. PT tomorrow    07/09/2023 Patient states  Relevant Historical Information: Hypertension, DM type I, CKD  Additional pertinent review of systems negative.   Current Outpatient Medications:    albuterol (PROVENTIL) (2.5 MG/3ML) 0.083% nebulizer solution, Take 3 mLs (2.5 mg total) by nebulization every 6 (six) hours as needed for wheezing or shortness of breath., Disp: 150 mL, Rfl: 1   albuterol (VENTOLIN HFA) 108 (90 Base) MCG/ACT inhaler, TAKE 2 PUFFS BY MOUTH EVERY 6 HOURS AS NEEDED FOR WHEEZE OR SHORTNESS OF BREATH, Disp: 18 each, Rfl: 2   ammonium  lactate (AMLACTIN) 12 % lotion, Apply 1 Application topically as needed for dry skin., Disp: 400 g, Rfl: 0   amphetamine-dextroamphetamine (ADDERALL) 20 MG tablet, Take 20 mg by mouth daily., Disp: , Rfl:    Budeson-Glycopyrrol-Formoterol (BREZTRI AEROSPHERE) 160-9-4.8 MCG/ACT AERO, Inhale 2 puffs into the lungs 2 (two) times daily., Disp: , Rfl:    Cariprazine HCl (VRAYLAR) 4.5 MG CAPS, Take 1 capsule (4.5 mg total) by mouth daily., Disp: 30 capsule, Rfl: 3   carvedilol (COREG) 6.25 MG tablet, Take 6.25 mg by mouth 2 (two) times daily., Disp: , Rfl:    clonazePAM (KLONOPIN) 0.5 MG tablet, Take 1 tablet (0.5 mg total) by mouth every 8 (eight) hours as needed. for anxiety, Disp: 20 tablet, Rfl: 0   FASENRA 30 MG/ML SOSY, Inject into the skin. Every 3 months, Disp: , Rfl:    fluticasone (FLONASE) 50 MCG/ACT nasal spray, Place 2 sprays into both nostrils daily. (Patient taking differently: Place 2 sprays into both nostrils daily as needed (seasonal allergies.).), Disp: 16 g, Rfl: 2   furosemide (LASIX) 40 MG tablet, Take 40 mg by mouth every other day. , Disp: , Rfl:    GLUCAGON EMERGENCY 1 MG injection, Inject 1 mg into the skin as needed (hypoglycemia.)., Disp: , Rfl:    glucose blood (ACCU-CHEK GUIDE) test strip, Use to check blood sugar 3  time a day, Disp: 300 strip, Rfl: 4   Insulin Human (INSULIN PUMP) SOLN, Inject 1 each into the skin 3 times daily with meals, bedtime and 2 AM. Novolog: 36-38 units per day, Disp: , Rfl:    insulin lispro (ADMELOG) 100 UNIT/ML injection, Use under the skin according to insulin pump settings. Use up to 150 units per day., Disp: 50 mL, Rfl: 1   insulin lispro (HUMALOG) 100 UNIT/ML injection, Inject up to 150 units daily according to insulin pump settings., Disp: 10 mL, Rfl: 0   Insulin Pen Needle 32G X 4 MM MISC, 1 each by Does not apply route once a week., Disp: 50 each, Rfl: 0   levonorgestrel (MIRENA, 52 MG,) 20 MCG/24HR IUD, 1 each by Intrauterine route once.,  Disp: , Rfl:    losartan (COZAAR) 25 MG tablet, Take 25 mg by mouth daily., Disp: , Rfl:    montelukast (SINGULAIR) 10 MG tablet, Take 1 tablet (10 mg total) by mouth at bedtime., Disp: 90 tablet, Rfl: 1   NOVOLOG 100 UNIT/ML injection, Inject 36-38 Units into the skin daily. Via insulin pump per sliding scale, Disp: , Rfl:    pantoprazole (PROTONIX) 40 MG tablet, Take 1 tablet (40 mg total) by mouth daily., Disp: 30 tablet, Rfl: 3   pravastatin (PRAVACHOL) 20 MG tablet, SMARTSIG:1 Tablet(s) By Mouth Every Evening, Disp: , Rfl:    promethazine (PHENERGAN) 25 MG tablet, Take 1 tablet (25 mg total) by mouth every 8 (eight) hours as needed for nausea or vomiting., Disp: 30 tablet, Rfl: 0   sertraline (ZOLOFT) 100 MG tablet, Take 1 tablet (100 mg total) by mouth daily., Disp: 90 tablet, Rfl: 0   tirzepatide (MOUNJARO) 5 MG/0.5ML Pen, Inject 5 mg into the skin once a week., Disp: 6 mL, Rfl: 1   tiZANidine (ZANAFLEX) 4 MG tablet, Take 4 mg by mouth 2 (two) times daily as needed for muscle spasms., Disp: , Rfl:    Ubrogepant (UBRELVY) 100 MG TABS, Take 1 tablet (100 mg total) by mouth daily as needed (migraine headaches.)., Disp: 30 tablet, Rfl: 3   Objective:     There were no vitals filed for this visit.    There is no height or weight on file to calculate BMI.    Physical Exam:    ***   Electronically signed by:  [name] D.[name] 1:39 PM 07/06/23

## 2023-07-08 ENCOUNTER — Emergency Department (HOSPITAL_COMMUNITY): Payer: Medicare Other

## 2023-07-08 ENCOUNTER — Other Ambulatory Visit: Payer: Self-pay

## 2023-07-08 ENCOUNTER — Emergency Department (HOSPITAL_COMMUNITY)
Admission: EM | Admit: 2023-07-08 | Discharge: 2023-07-09 | Disposition: A | Discharge status: Other Acute Inpatient Hospital | Payer: Medicare Other | Attending: Emergency Medicine | Admitting: Emergency Medicine

## 2023-07-08 ENCOUNTER — Encounter (HOSPITAL_COMMUNITY): Payer: Self-pay

## 2023-07-08 DIAGNOSIS — Z794 Long term (current) use of insulin: Secondary | ICD-10-CM | POA: Insufficient documentation

## 2023-07-08 DIAGNOSIS — E1065 Type 1 diabetes mellitus with hyperglycemia: Secondary | ICD-10-CM | POA: Insufficient documentation

## 2023-07-08 DIAGNOSIS — E1040 Type 1 diabetes mellitus with diabetic neuropathy, unspecified: Secondary | ICD-10-CM | POA: Insufficient documentation

## 2023-07-08 DIAGNOSIS — Z87442 Personal history of urinary calculi: Secondary | ICD-10-CM | POA: Insufficient documentation

## 2023-07-08 DIAGNOSIS — F3160 Bipolar disorder, current episode mixed, unspecified: Secondary | ICD-10-CM

## 2023-07-08 DIAGNOSIS — T424X2A Poisoning by benzodiazepines, intentional self-harm, initial encounter: Secondary | ICD-10-CM | POA: Insufficient documentation

## 2023-07-08 DIAGNOSIS — R739 Hyperglycemia, unspecified: Secondary | ICD-10-CM

## 2023-07-08 DIAGNOSIS — I10 Essential (primary) hypertension: Secondary | ICD-10-CM | POA: Insufficient documentation

## 2023-07-08 DIAGNOSIS — T50902A Poisoning by unspecified drugs, medicaments and biological substances, intentional self-harm, initial encounter: Secondary | ICD-10-CM | POA: Diagnosis present

## 2023-07-08 DIAGNOSIS — F3163 Bipolar disorder, current episode mixed, severe, without psychotic features: Secondary | ICD-10-CM | POA: Insufficient documentation

## 2023-07-08 DIAGNOSIS — J45909 Unspecified asthma, uncomplicated: Secondary | ICD-10-CM | POA: Insufficient documentation

## 2023-07-08 DIAGNOSIS — T1491XA Suicide attempt, initial encounter: Secondary | ICD-10-CM

## 2023-07-08 LAB — CBC WITH DIFFERENTIAL/PLATELET
Abs Immature Granulocytes: 0.06 10*3/uL (ref 0.00–0.07)
Basophils Absolute: 0.1 10*3/uL (ref 0.0–0.1)
Basophils Relative: 1 %
Eosinophils Absolute: 0.2 10*3/uL (ref 0.0–0.5)
Eosinophils Relative: 2 %
HCT: 44.7 % (ref 36.0–46.0)
Hemoglobin: 14.7 g/dL (ref 12.0–15.0)
Immature Granulocytes: 1 %
Lymphocytes Relative: 26 %
Lymphs Abs: 2.5 10*3/uL (ref 0.7–4.0)
MCH: 28.4 pg (ref 26.0–34.0)
MCHC: 32.9 g/dL (ref 30.0–36.0)
MCV: 86.5 fL (ref 80.0–100.0)
Monocytes Absolute: 0.9 10*3/uL (ref 0.1–1.0)
Monocytes Relative: 10 %
Neutro Abs: 5.9 10*3/uL (ref 1.7–7.7)
Neutrophils Relative %: 60 %
Platelets: 317 10*3/uL (ref 150–400)
RBC: 5.17 MIL/uL — ABNORMAL HIGH (ref 3.87–5.11)
RDW: 12.7 % (ref 11.5–15.5)
WBC: 9.6 10*3/uL (ref 4.0–10.5)
nRBC: 0 % (ref 0.0–0.2)

## 2023-07-08 LAB — URINALYSIS, ROUTINE W REFLEX MICROSCOPIC
Bacteria, UA: NONE SEEN
Bilirubin Urine: NEGATIVE
Glucose, UA: >=500 mg/dL — AB
Hgb urine dipstick: NEGATIVE
Ketones, ur: NEGATIVE mg/dL
Leukocytes,Ua: NEGATIVE
Nitrite: NEGATIVE
Protein, ur: NEGATIVE mg/dL
Specific Gravity, Urine: 1.025 (ref 1.005–1.030)
pH: 6 (ref 5.0–8.0)

## 2023-07-08 LAB — ETHANOL: Alcohol, Ethyl (B): <10 mg/dL (ref <10)

## 2023-07-08 LAB — CBG MONITORING, ED
Glucose-Capillary: 452 mg/dL — ABNORMAL HIGH (ref 70–99)
Glucose-Capillary: 507 mg/dL (ref 70–99)
Glucose-Capillary: 283 mg/dL — ABNORMAL HIGH (ref 70–99)
Glucose-Capillary: 207 mg/dL — ABNORMAL HIGH (ref 70–99)
Glucose-Capillary: 244 mg/dL — ABNORMAL HIGH (ref 70–99)
Glucose-Capillary: 247 mg/dL — ABNORMAL HIGH (ref 70–99)

## 2023-07-08 LAB — COMPREHENSIVE METABOLIC PANEL
ALT: 18 U/L (ref 0–44)
AST: 18 U/L (ref 15–41)
Albumin: 3.5 g/dL (ref 3.5–5.0)
Alkaline Phosphatase: 108 U/L (ref 38–126)
Anion gap: 11 (ref 5–15)
BUN: 25 mg/dL — ABNORMAL HIGH (ref 6–20)
CO2: 22 mmol/L (ref 22–32)
Calcium: 9 mg/dL (ref 8.9–10.3)
Chloride: 95 mmol/L — ABNORMAL LOW (ref 98–111)
Creatinine, Ser: 1.24 mg/dL — ABNORMAL HIGH (ref 0.44–1.00)
GFR, Estimated: 57 mL/min — ABNORMAL LOW (ref >60)
Glucose, Bld: 528 mg/dL (ref 70–99)
Potassium: 4.5 mmol/L (ref 3.5–5.1)
Sodium: 128 mmol/L — ABNORMAL LOW (ref 135–145)
Total Bilirubin: 0.5 mg/dL (ref 0.3–1.2)
Total Protein: 6.4 g/dL — ABNORMAL LOW (ref 6.5–8.1)

## 2023-07-08 LAB — BASIC METABOLIC PANEL
Anion gap: 6 (ref 5–15)
BUN: 22 mg/dL — ABNORMAL HIGH (ref 6–20)
CO2: 29 mmol/L (ref 22–32)
Calcium: 8.3 mg/dL — ABNORMAL LOW (ref 8.9–10.3)
Chloride: 97 mmol/L — ABNORMAL LOW (ref 98–111)
Creatinine, Ser: 0.95 mg/dL (ref 0.44–1.00)
GFR, Estimated: >60 mL/min (ref >60)
Glucose, Bld: 291 mg/dL — ABNORMAL HIGH (ref 70–99)
Potassium: 4 mmol/L (ref 3.5–5.1)
Sodium: 132 mmol/L — ABNORMAL LOW (ref 135–145)

## 2023-07-08 LAB — HEMOGLOBIN A1C
Hgb A1c MFr Bld: 10.8 % — ABNORMAL HIGH (ref 4.8–5.6)
Mean Plasma Glucose: 263.26 mg/dL

## 2023-07-08 LAB — BLOOD GAS, VENOUS
Acid-Base Excess: 3 mmol/L — ABNORMAL HIGH (ref 0.0–2.0)
Bicarbonate: 28.5 mmol/L — ABNORMAL HIGH (ref 20.0–28.0)
O2 Saturation: 76 %
Patient temperature: 37
pCO2, Ven: 46 mm[Hg] (ref 44–60)
pH, Ven: 7.4 (ref 7.25–7.43)
pO2, Ven: 43 mm[Hg] (ref 32–45)

## 2023-07-08 LAB — RAPID URINE DRUG SCREEN, HOSP PERFORMED
Amphetamines: NOT DETECTED
Barbiturates: NOT DETECTED
Benzodiazepines: NOT DETECTED
Cocaine: NOT DETECTED
Opiates: NOT DETECTED
Tetrahydrocannabinol: NOT DETECTED

## 2023-07-08 LAB — HCG, SERUM, QUALITATIVE: Preg, Serum: NEGATIVE

## 2023-07-08 LAB — SALICYLATE LEVEL: Salicylate Lvl: <7 mg/dL — ABNORMAL LOW (ref 7.0–30.0)

## 2023-07-08 LAB — ACETAMINOPHEN LEVEL: Acetaminophen (Tylenol), Serum: <10 ug/mL — ABNORMAL LOW (ref 10–30)

## 2023-07-08 LAB — MAGNESIUM: Magnesium: 2.1 mg/dL (ref 1.7–2.4)

## 2023-07-08 MED ORDER — CARIPRAZINE HCL 4.5 MG PO CAPS
1.0000 | ORAL_CAPSULE | Freq: Every day | ORAL | Status: DC
  Filled 2023-07-08: qty 1

## 2023-07-08 MED ORDER — SERTRALINE HCL 50 MG PO TABS
100.0000 mg | ORAL_TABLET | Freq: Every day | ORAL | Status: DC
  Administered 2023-07-08 – 2023-07-09 (×2): 100 mg via ORAL
  Filled 2023-07-08 (×2): qty 2

## 2023-07-08 MED ORDER — INSULIN ASPART PROT & ASPART (70-30 MIX) 100 UNIT/ML ~~LOC~~ SUSP
10.0000 [IU] | Freq: Once | SUBCUTANEOUS | Status: AC
  Administered 2023-07-08: 10 [IU] via SUBCUTANEOUS
  Filled 2023-07-08: qty 10

## 2023-07-08 MED ORDER — INSULIN ASPART PROT & ASPART (70-30 MIX) 100 UNIT/ML ~~LOC~~ SUSP: 10.0000 [IU] | Freq: Once | SUBCUTANEOUS | Status: DC

## 2023-07-08 MED ORDER — TIRZEPATIDE 5 MG/0.5ML ~~LOC~~ SOAJ: 5.0000 mg | SUBCUTANEOUS | Status: DC

## 2023-07-08 MED ORDER — ONDANSETRON 4 MG PO TBDP
4.0000 mg | ORAL_TABLET | Freq: Three times a day (TID) | ORAL | Status: DC | PRN
  Administered 2023-07-08: 4 mg via ORAL
  Filled 2023-07-08: qty 1

## 2023-07-08 MED ORDER — INSULIN ASPART 100 UNIT/ML IJ SOLN
0.0000 [IU] | INTRAMUSCULAR | Status: DC
  Administered 2023-07-08 (×2): 3 [IU] via SUBCUTANEOUS
  Administered 2023-07-08: 5 [IU] via SUBCUTANEOUS
  Administered 2023-07-08: 3 [IU] via SUBCUTANEOUS
  Administered 2023-07-09: 2 [IU] via SUBCUTANEOUS
  Administered 2023-07-09: 1 [IU] via SUBCUTANEOUS
  Filled 2023-07-08: qty 0.09

## 2023-07-08 MED ORDER — UBROGEPANT 100 MG PO TABS: 100.0000 mg | ORAL_TABLET | Freq: Every day | ORAL | Status: DC | PRN

## 2023-07-08 MED ORDER — INSULIN GLARGINE-YFGN 100 UNIT/ML ~~LOC~~ SOLN
10.0000 [IU] | Freq: Two times a day (BID) | SUBCUTANEOUS | Status: DC
  Administered 2023-07-08 – 2023-07-09 (×3): 10 [IU] via SUBCUTANEOUS
  Filled 2023-07-08 (×4): qty 0.1

## 2023-07-08 MED ORDER — CARIPRAZINE HCL 1.5 MG PO CAPS
4.5000 mg | ORAL_CAPSULE | Freq: Every day | ORAL | Status: DC
  Administered 2023-07-08: 4.5 mg via ORAL
  Filled 2023-07-08 (×2): qty 3

## 2023-07-08 MED ORDER — LOSARTAN POTASSIUM 25 MG PO TABS
25.0000 mg | ORAL_TABLET | Freq: Every day | ORAL | Status: DC
  Administered 2023-07-09: 25 mg via ORAL
  Filled 2023-07-08 (×2): qty 1

## 2023-07-08 MED ORDER — INSULIN ASPART 100 UNIT/ML IJ SOLN
4.0000 [IU] | Freq: Three times a day (TID) | INTRAMUSCULAR | Status: DC
  Administered 2023-07-09: 4 [IU] via SUBCUTANEOUS
  Filled 2023-07-08: qty 0.04

## 2023-07-08 MED ORDER — INSULIN ASPART PROT & ASPART (70-30 MIX) 100 UNIT/ML ~~LOC~~ SUSP
4.0000 [IU] | Freq: Three times a day (TID) | SUBCUTANEOUS | Status: DC
Start: 2023-07-08 — End: 2023-07-08

## 2023-07-08 MED ORDER — SODIUM CHLORIDE 0.9 % IV BOLUS: 1000.0000 mL | Freq: Once | INTRAVENOUS | Status: DC

## 2023-07-08 MED ORDER — INSULIN GLARGINE-YFGN 100 UNIT/ML ~~LOC~~ SOLN: 10.0000 [IU] | Freq: Every day | SUBCUTANEOUS | Status: DC

## 2023-07-08 MED ORDER — SODIUM CHLORIDE 0.9 % IV BOLUS
1000.0000 mL | Freq: Once | INTRAVENOUS | Status: AC
  Administered 2023-07-08: 1000 mL via INTRAVENOUS

## 2023-07-08 NOTE — ED Notes (Addendum)
Called security to wand pt-gave pts belongings to husband

## 2023-07-08 NOTE — ED Notes (Signed)
Poison control: Expect drowsiness, monitor for 4 hrs to get 4 hr Tylenol level on pt.

## 2023-07-08 NOTE — ED Triage Notes (Addendum)
Pt states that she is bipolar and has been going through some issues with her husband. Pt reports taking 4 extra Klonopin (0.5 mg ) 1 hr ago. Pt normal takes 0.5 mg. Pt reports of previous hx of self harm. Pt reports feeling tired.

## 2023-07-08 NOTE — Consult Note (Signed)
Attempt to evaluate patient failed as she was deeply sleeping and husband at the bed side tried to wake her up failed.  She opens her eyes and falls back asleep.  Patient did not receive any medications at this time but sleeping.  BS is elevated but is being managed with Insulin.  Will try later.

## 2023-07-08 NOTE — Consult Note (Addendum)
BH ED ASSESSMENT   Reason for Consult:  Psychiatry evaluation Referring Physician:  ER Physician Patient Identification: [name] MRN:  [id number] ED Chief Complaint: Bipolar 1 disorder, mixed, severe (HCC)  Diagnosis:  Principal Problem:   Bipolar 1 disorder, mixed, severe (HCC) Active Problems:   Drug overdose, intentional [facility])   ED Assessment Time Calculation: Start Time: 1317 Stop Time: 1349 Total Time in Minutes (Assessment Completion): 32   Subjective:   [name] is a 37 y.o. female patient admitted with Previous hx of Bipolar 1 disorder mixed, Generalized anxiety., Suicide attempts, self harm behavior and Depression was brought in early this morning by her husband after she OD on six tablets of 0.5 mg Klonopin after an argument with her husband.  Patient denies it was a suicide attempt but states she was anxious and overwhelmed.  She admitted to self harm behavior x 4 and one attempted suicide by almost jumping off the Window.  HPI:  Patient was seen a second time still sleeping but able to engage in meaningful conversation this time.  She admitted taking extra Klonopin after she had an argument with her husband.  She states she did not want to die but was overwhelmed and anxious.  Patient is going through multiple medical issues as well-DM on Insulin pump, Chronic Kidney disease and she is still grieving the death of her father who died suddenly after a heart attack on a trip to visit his daughter patient's sister.  Father died while speaking to this patient on the phone.  Patient sees DR Jennelle Human for her Psychiatry care and Medications.  She has been hospitalized once at [facility] and [facility].  Patient reports a suicide attempt by attempting to jump off Window but her parents in 2019 pulled her back and called the Police.  Patient denies family hx of Mental illness or suicide attempt. Patient vehemently denies feeling suicidal at this time. Collateral  information from Husband,  Amrita Radu is that he asked patient the reason why she was on the phone with ex husband for 30 minutes she got upset.  She later explained that there is issue with her daughter who stays with the Dad.  Patient, after the argument started acting weird and he called her mother who suspected she took something and advised husband to take her to the hospital.  Wilber Oliphant states that her mood changes fast from Depression to anxiety and to some kind of behavior that he cannot describe.  He added that 10 years ago patient committed suicide but then they were not together.  He added that patient is still grieving the sudden death of her father and an upcoming travel to [place] by her mother and friends is making her anxious where she is worried her mother may have the same faith as her father.  Her mother reassured her that she will be fine. Based on information provided, previous suicide attempt and self harm behavior patient is a danger to self and meets criteria for inpatient Psychiatry hospitalization. We will seek bed placement at any inpatient Psychiatry hospital and will resume home Medications.  Past Psychiatric History: Previous hx of Bipolar 1 disorder mixed, Generalized anxiety., Suicide attempts, self harm behavior and Depression.  Previous self harm behaviors,,  Previous inpatient hospitalization at [facility].  Out patient Provider is DR Jennelle Human.  Risk to Self or Others: Is the patient at risk to self? Yes Has the patient been a risk to self in  the past 6 months? No Has the patient been a risk to self within the distant past? Yes Is the patient a risk to others? No Has the patient been a risk to others in the past 6 months? No Has the patient been a risk to others within the distant past? No  [place] Scale:  Flowsheet Row ED from 07/08/2023 in [facility] Emergency Department at [facility] Counselor from 09/20/2022 in [facility] Outpatient  Behavioral Health at [facility] from 05/18/2022 in [facility] Outpatient Behavioral Health at [facility] RISK CATEGORY No Risk No Risk No Risk       AIMS:  , , ,  ,   ASAM:    Substance Abuse:     Past Medical History:  Past Medical History:  Diagnosis Date   Anemia    Anemia of chronic disease 06/07/2012   Formatting of this note might be different from the original. Hematology consultation 07-13-16 (see note)--> Anemia of chronic disease Required 3 transfusions during pregnancy  Last Assessment & Plan:  Formatting of this note might be different from the original. She has now required 3 blood transfusions this pregnancy for her anemia of chronic disease. The most recent was one week ago when she bec   Anxiety    Asthma    Asthma 11/10/2013   Back pain    Bipolar 1 disorder (HCC)    Chest pain    Chronic renal failure syndrome, stage 3 (moderate) (HCC)    Colles' fracture of left radius 10/11/2013   Complication of anesthesia    Constipation    Depression    Depression    Diabetic gastroparesis (HCC)    Diabetic neuropathy, type I diabetes mellitus (HCC)    Diabetic ulcer of right great toe (HCC) 08/27/2020   Edema, lower extremity    Gallbladder disease    Gastroparesis    GERD (gastroesophageal reflux disease)    Headache(784.0)    Hypertension    Joint pain    Kidney stones    Palpitations    Polyneuropathy in diabetes(357.2)    PONV (postoperative nausea and vomiting)    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)    S/P carpal tunnel release left 10/16/19 11/04/2019   Shortness of breath    Tachycardia    baseline tachycardia    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)     Past Surgical History:  Procedure Laterality Date   ANAL RECTAL MANOMETRY N/A 03/25/2018   Procedure: ANO RECTAL MANOMETRY;  Surgeon: Sherrilyn Rist, MD;  Location: [facility];  Service: Gastroenterology;  Laterality: N/A;   CARPAL TUNNEL  RELEASE Left 10/16/2019   Procedure: LEFT CARPAL TUNNEL RELEASE;  Surgeon: Vickki Hearing, MD;  Location: [facility] OR[facility];  Service: Orthopedics;  Laterality: Left;   CESAREAN SECTION     x 2   CHOLECYSTECTOMY     EYE SURGERY     REFRACTIVE SURGERY Bilateral    Family History:  Family History  Problem Relation Age of Onset   Diabetes Mother        type 2   Hypertension Mother    Obesity Mother    Diabetes Father    Hypertension Father    High Cholesterol Father    Sleep apnea Father    Obesity Father    Heart disease Father    Heart attack Father    Diabetes Brother        type 1  Renal Disease Brother        renal failure   Hypertension Brother    Hypertension Maternal Grandmother    Breast cancer Maternal Aunt 64   Colon cancer Maternal Aunt    Uterine cancer Maternal Aunt 49   Breast cancer Other    Rectal cancer Neg Hx    Esophageal cancer Neg Hx    Family Psychiatric  History: Denies Social History:  Social History   Substance and Sexual Activity  Alcohol Use No     Social History   Substance and Sexual Activity  Drug Use No    Social History   Socioeconomic History   Marital status: Married    Spouse name: Not on file   Number of children: 2   Years of education: 12   Highest education level: 12th grade  Occupational History   Occupation: Arts development officer  Tobacco Use   Smoking status: Never   Smokeless tobacco: Never  Vaping Use   Vaping status: Never Used  Substance and Sexual Activity   Alcohol use: No   Drug use: No   Sexual activity: Yes    Partners: Male    Birth control/protection: I.U.D.    Comment: Mirena  Other Topics Concern   Not on file  Social History Narrative   Pt has a daughter and boyfriend.    Dad passed away suddenly heart attack 12/2019   Social Determinants of Health   Financial Resource Strain: Medium Risk (12/07/2022)   Overall Financial Resource Strain (CARDIA)    Difficulty of Paying Living Expenses: Somewhat hard   Food Insecurity: Food Insecurity Present (12/07/2022)   Hunger Vital Sign    Worried About Running Out of Food in the Last Year: Sometimes true    Ran Out of Food in the Last Year: Sometimes true  Transportation Needs: No Transportation Needs (12/07/2022)   PRAPARE - Administrator, Civil Service (Medical): No    Lack of Transportation (Non-Medical): No  Physical Activity: Insufficiently Active (12/07/2022)   Exercise Vital Sign    Days of Exercise per Week: 2 days    Minutes of Exercise per Session: 30 min  Stress: No Stress Concern Present (12/07/2022)   [employer] of Occupational Health - Occupational Stress Questionnaire    Feeling of Stress : Only a little  Social Connections: Moderately Integrated (12/07/2022)   Social Connection and Isolation Panel [NHANES]    Frequency of Communication with Friends and Family: More than three times a week    Frequency of Social Gatherings with Friends and Family: Once a week    Attends Religious Services: More than 4 times per year    Active Member of [employer] or Organizations: No    Attends Banker Meetings: Never    Marital Status: Married   Additional Social History:    Allergies:   Allergies  Allergen Reactions   Nsaids Other (See Comments)    Due to Renal disease   Other    Toradol [Ketorolac Tromethamine]     Cannot take d/t kidney issues    Ceftin Rash    Labs:  Results for orders placed or performed during the hospital encounter of 07/08/23 (from the past 48 hour(s))  CBG monitoring, ED     Status: Abnormal   Collection Time: 07/08/23  3:05 AM  Result Value Ref Range   Glucose-Capillary 452 (H) 70 - 99 mg/dL    Comment: Glucose reference range applies only to samples taken after fasting for  at least 8 hours.  Comprehensive metabolic panel     Status: Abnormal   Collection Time: 07/08/23  4:34 AM  Result Value Ref Range   Sodium 128 (L) 135 - 145 mmol/L   Potassium 4.5 3.5 - 5.1 mmol/L    Chloride 95 (L) 98 - 111 mmol/L   CO2 22 22 - 32 mmol/L   Glucose, Bld 528 (HH) 70 - 99 mg/dL    Comment: CRITICAL RESULT CALLED TO, READ BACK BY AND VERIFIED WITH Clois Dupes RN @ [phone] 07/08/23. GILBERTL Glucose reference range applies only to samples taken after fasting for at least 8 hours.    BUN 25 (H) 6 - 20 mg/dL   Creatinine, Ser 0.86 (H) 0.44 - 1.00 mg/dL   Calcium 9.0 8.9 - 57.8 mg/dL   Total Protein 6.4 (L) 6.5 - 8.1 g/dL   Albumin 3.5 3.5 - 5.0 g/dL   AST 18 15 - 41 U/L   ALT 18 0 - 44 U/L   Alkaline Phosphatase 108 38 - 126 U/L   Total Bilirubin 0.5 0.3 - 1.2 mg/dL   GFR, Estimated 57 (L) >60 mL/min    Comment: (NOTE) Calculated using the CKD-EPI Creatinine Equation (2021)    Anion gap 11 5 - 15    Comment: Performed at [facility], [address], [place], [place] [address]  hCG, serum, qualitative     Status: None   Collection Time: 07/08/23  4:34 AM  Result Value Ref Range   Preg, Serum NEGATIVE NEGATIVE    Comment:        THE SENSITIVITY OF THIS METHODOLOGY IS >10 mIU/mL. Performed at [facility], [address] [place], [place] [address]   Magnesium     Status: None   Collection Time: 07/08/23  4:34 AM  Result Value Ref Range   Magnesium 2.1 1.7 - 2.4 mg/dL    Comment: Performed at [facility], [address] [place], [place] [address]  Ethanol     Status: None   Collection Time: 07/08/23  4:40 AM  Result Value Ref Range   Alcohol, Ethyl (B) <10 <10 mg/dL    Comment: (NOTE) Lowest detectable limit for serum alcohol is 10 mg/dL.  For medical purposes only. Performed at [facility], [address], [place], [place] [address]   Salicylate level     Status: Abnormal   Collection Time: 07/08/23  4:40 AM  Result Value Ref Range   Salicylate Lvl <7.0 (L) 7.0 - 30.0 mg/dL    Comment: Performed at [facility], [address], [place], [place] [address]   Acetaminophen level     Status: Abnormal   Collection Time: 07/08/23  4:40 AM  Result Value Ref Range   Acetaminophen (Tylenol), Serum <10 (L) 10 - 30 ug/mL    Comment: (NOTE) Therapeutic concentrations vary significantly. A range of 10-30 ug/mL  may be an effective concentration for many patients. However, some  are best treated at concentrations outside of this range. Acetaminophen concentrations >150 ug/mL at 4 hours after ingestion  and >50 ug/mL at 12 hours after ingestion are often associated with  toxic reactions.  Performed at [facility], [address], [place], [place] [address]   CBC with Differential     Status: Abnormal   Collection Time: 07/08/23  4:41 AM  Result Value Ref Range   WBC 9.6 4.0 - 10.5 K/uL   RBC 5.17 (H) 3.87 -  5.11 MIL/uL   Hemoglobin 14.7 12.0 - 15.0 g/dL   HCT 54.0 98.1 - 19.1 %   MCV 86.5 80.0 - 100.0 fL   MCH 28.4 26.0 - 34.0 pg   MCHC 32.9 30.0 - 36.0 g/dL   RDW 47.8 29.5 - 62.1 %   Platelets 317 150 - 400 K/uL   nRBC 0.0 0.0 - 0.2 %   Neutrophils Relative % 60 %   Neutro Abs 5.9 1.7 - 7.7 K/uL   Lymphocytes Relative 26 %   Lymphs Abs 2.5 0.7 - 4.0 K/uL   Monocytes Relative 10 %   Monocytes Absolute 0.9 0.1 - 1.0 K/uL   Eosinophils Relative 2 %   Eosinophils Absolute 0.2 0.0 - 0.5 K/uL   Basophils Relative 1 %   Basophils Absolute 0.1 0.0 - 0.1 K/uL   Immature Granulocytes 1 %   Abs Immature Granulocytes 0.06 0.00 - 0.07 K/uL    Comment: Performed at [facility], [address] [place], [place] [address]  CBG monitoring, ED     Status: Abnormal   Collection Time: 07/08/23  5:44 AM  Result Value Ref Range   Glucose-Capillary 507 (HH) 70 - 99 mg/dL    Comment: Glucose reference range applies only to samples taken after fasting for at least 8 hours.   Comment 1 Document in Chart    Comment 2 Call MD NNP PA CNM   Blood gas, venous (at [facility] and [facility])     Status: Abnormal   Collection Time: 07/08/23   5:50 AM  Result Value Ref Range   pH, Ven 7.4 7.25 - 7.43   pCO2, Ven 46 44 - 60 mmHg   pO2, Ven 43 32 - 45 mmHg   Bicarbonate 28.5 (H) 20.0 - 28.0 mmol/L   Acid-Base Excess 3.0 (H) 0.0 - 2.0 mmol/L   O2 Saturation 76 %   Patient temperature 37.0     Comment: Performed at [facility], [address] [place], [place] [address]  Rapid urine drug screen (hospital performed)     Status: None   Collection Time: 07/08/23  6:35 AM  Result Value Ref Range   Opiates NONE DETECTED NONE DETECTED   Cocaine NONE DETECTED NONE DETECTED   Benzodiazepines NONE DETECTED NONE DETECTED   Amphetamines NONE DETECTED NONE DETECTED   Tetrahydrocannabinol NONE DETECTED NONE DETECTED   Barbiturates NONE DETECTED NONE DETECTED    Comment: (NOTE) DRUG SCREEN FOR MEDICAL PURPOSES ONLY.  IF CONFIRMATION IS NEEDED FOR ANY PURPOSE, NOTIFY LAB WITHIN 5 DAYS.  LOWEST DETECTABLE LIMITS FOR URINE DRUG SCREEN Drug Class                     Cutoff (ng/mL) Amphetamine and metabolites    1000 Barbiturate and metabolites    200 Benzodiazepine                 200 Opiates and metabolites        300 Cocaine and metabolites        300 THC                            50 Performed at [facility], [address] [place], [place] [address]   Urinalysis, Routine w reflex microscopic -Urine, Clean Catch     Status: Abnormal   Collection Time: 07/08/23  6:35 AM  Result Value Ref Range   Color, Urine STRAW (  A) YELLOW   APPearance CLEAR CLEAR   Specific Gravity, Urine 1.025 1.005 - 1.030   pH 6.0 5.0 - 8.0   Glucose, UA >=500 (A) NEGATIVE mg/dL   Hgb urine dipstick NEGATIVE NEGATIVE   Bilirubin Urine NEGATIVE NEGATIVE   Ketones, ur NEGATIVE NEGATIVE mg/dL   Protein, ur NEGATIVE NEGATIVE mg/dL   Nitrite NEGATIVE NEGATIVE   Leukocytes,Ua NEGATIVE NEGATIVE   RBC / HPF 0-5 0 - 5 RBC/hpf   WBC, UA 0-5 0 - 5 WBC/hpf   Bacteria, UA NONE SEEN NONE SEEN   Squamous Epithelial / HPF  0-5 0 - 5 /HPF   Mucus PRESENT     Comment: Performed at [facility], [address], [place], [place] [address]  Basic metabolic panel     Status: Abnormal   Collection Time: 07/08/23  9:17 AM  Result Value Ref Range   Sodium 132 (L) 135 - 145 mmol/L   Potassium 4.0 3.5 - 5.1 mmol/L   Chloride 97 (L) 98 - 111 mmol/L   CO2 29 22 - 32 mmol/L   Glucose, Bld 291 (H) 70 - 99 mg/dL    Comment: Glucose reference range applies only to samples taken after fasting for at least 8 hours.   BUN 22 (H) 6 - 20 mg/dL   Creatinine, Ser 1.91 0.44 - 1.00 mg/dL   Calcium 8.3 (L) 8.9 - 10.3 mg/dL   GFR, Estimated >47 >82 mL/min    Comment: (NOTE) Calculated using the CKD-EPI Creatinine Equation (2021)    Anion gap 6 5 - 15    Comment: Performed at [facility], [address] [place], [place] [address]  CBG monitoring, ED     Status: Abnormal   Collection Time: 07/08/23  9:27 AM  Result Value Ref Range   Glucose-Capillary 283 (H) 70 - 99 mg/dL    Comment: Glucose reference range applies only to samples taken after fasting for at least 8 hours.   Comment 1 Notify RN   Hemoglobin A1c     Status: Abnormal   Collection Time: 07/08/23 10:30 AM  Result Value Ref Range   Hgb A1c MFr Bld 10.8 (H) 4.8 - 5.6 %    Comment: (NOTE) Pre diabetes:          5.7%-6.4%  Diabetes:              >6.4%  Glycemic control for   <7.0% adults with diabetes    Mean Plasma Glucose 263.26 mg/dL    Comment: Performed at [facility], [address] [place], [place] [address]  CBG monitoring, ED     Status: Abnormal   Collection Time: 07/08/23  1:36 PM  Result Value Ref Range   Glucose-Capillary 207 (H) 70 - 99 mg/dL    Comment: Glucose reference range applies only to samples taken after fasting for at least 8 hours.    Current Facility-Administered Medications  Medication Dose Route Frequency Provider Last Rate Last Admin   cariprazine (VRAYLAR) capsule 4.5 mg  4.5 mg  Oral Daily Carroll Sage, PA-C   4.5 mg at 07/08/23 1236   insulin aspart (novoLOG) injection 0-9 Units  0-9 Units Subcutaneous Q4H Harlene Salts A, PA-C   3 Units at 07/08/23 1352   insulin aspart (novoLOG) injection 4 Units  4 Units Subcutaneous TID WC Morelli, Brandon A, PA-C       insulin glargine-yfgn (SEMGLEE) injection 10 Units  10 Units Subcutaneous BID Harlene Salts A, PA-C  10 Units at 07/08/23 1159   losartan (COZAAR) tablet 25 mg  25 mg Oral Daily Carroll Sage, PA-C       ondansetron (ZOFRAN-ODT) disintegrating tablet 4 mg  4 mg Oral Q8H PRN Long, Arlyss Repress, MD   4 mg at 07/08/23 1341   sertraline (ZOLOFT) tablet 100 mg  100 mg Oral Daily Carroll Sage, PA-C   100 mg at 07/08/23 1236   Ubrogepant TABS 100 mg  100 mg Oral Daily PRN Carroll Sage, PA-C       Current Outpatient Medications  Medication Sig Dispense Refill   albuterol (PROVENTIL) (2.5 MG/3ML) 0.083% nebulizer solution Take 3 mLs (2.5 mg total) by nebulization every 6 (six) hours as needed for wheezing or shortness of breath. 150 mL 1   albuterol (VENTOLIN HFA) 108 (90 Base) MCG/ACT inhaler TAKE 2 PUFFS BY MOUTH EVERY 6 HOURS AS NEEDED FOR WHEEZE OR SHORTNESS OF BREATH 18 each 2   amphetamine-dextroamphetamine (ADDERALL) 20 MG tablet Take 20 mg by mouth daily.     Budeson-Glycopyrrol-Formoterol (BREZTRI AEROSPHERE) 160-9-4.8 MCG/ACT AERO Inhale 2 puffs into the lungs 2 (two) times daily.     Cariprazine HCl (VRAYLAR) 4.5 MG CAPS Take 1 capsule (4.5 mg total) by mouth daily. 30 capsule 3   carvedilol (COREG) 6.25 MG tablet Take 6.25 mg by mouth daily.     clonazePAM (KLONOPIN) 0.5 MG tablet Take 1 tablet (0.5 mg total) by mouth every 8 (eight) hours as needed. for anxiety 20 tablet 0   FASENRA 30 MG/ML SOSY Inject into the skin every 3 (three) months.     furosemide (LASIX) 40 MG tablet Take 40 mg by mouth daily as needed for fluid.     GLUCAGON EMERGENCY 1 MG injection Inject 1 mg into the  skin as needed (hypoglycemia.).     insulin lispro (HUMALOG) 100 UNIT/ML injection Inject up to 150 units daily according to insulin pump settings. 10 mL 0   levonorgestrel (MIRENA, 52 MG,) 20 MCG/24HR IUD 1 each by Intrauterine route once.     losartan (COZAAR) 25 MG tablet Take 25 mg by mouth daily.     montelukast (SINGULAIR) 10 MG tablet Take 1 tablet (10 mg total) by mouth at bedtime. 90 tablet 1   sertraline (ZOLOFT) 100 MG tablet Take 1 tablet (100 mg total) by mouth daily. 90 tablet 0   tirzepatide (MOUNJARO) 5 MG/0.5ML Pen Inject 5 mg into the skin once a week. (Patient taking differently: Inject 7.5 mg into the skin once a week.) 6 mL 1   Ubrogepant (UBRELVY) 100 MG TABS Take 1 tablet (100 mg total) by mouth daily as needed (migraine headaches.). 30 tablet 3   glucose blood (ACCU-CHEK GUIDE) test strip Use to check blood sugar 3 time a day 300 strip 4    Musculoskeletal: Strength & Muscle Tone:  seen in bed resting Gait & Station:  seen in bed resting Patient leans: Front   Psychiatric Specialty Exam: Presentation  General Appearance:  Casual; Neat  Eye Contact: Good  Speech: Clear and Coherent; Slow  Speech Volume: Normal  Handedness: Right   Mood and Affect  Mood: Depressed; Anxious  Affect: Congruent; Depressed   Thought Process  Thought Processes: Coherent; Goal Directed  Descriptions of Associations:Intact  Orientation:Full (Time, Place and Person)  Thought Content:Logical  History of Schizophrenia/Schizoaffective disorder:No data recorded Duration of Psychotic Symptoms:No data recorded Hallucinations:Hallucinations: None  Ideas of Reference:None  Suicidal Thoughts:Suicidal Thoughts: No  Homicidal Thoughts:Homicidal Thoughts: No  Sensorium  Memory: Immediate Good; Recent Good; Remote Fair  Judgment: Impaired  Insight: Fair   Chartered certified accountant: Fair  Attention Span: Fair  Recall: [employer] of  Knowledge:No data recorded Language: Fair   Psychomotor Activity  Psychomotor Activity: Psychomotor Activity: Normal   Assets  Assets: Manufacturing systems engineer; Desire for Improvement; Housing; Intimacy    Sleep  Sleep: Sleep: Good   Physical Exam: Physical Exam Vitals and nursing note reviewed.  Constitutional:      Appearance: Normal appearance.  HENT:     Nose: Nose normal.  Cardiovascular:     Rate and Rhythm: Normal rate and regular rhythm.  Pulmonary:     Effort: Pulmonary effort is normal.  Musculoskeletal:        General: Normal range of motion.     Cervical back: Normal range of motion.  Skin:    General: Skin is dry.  Neurological:     Mental Status: She is oriented to person, place, and time.  Psychiatric:        Attention and Perception: Attention and perception normal.        Mood and Affect: Mood is anxious and depressed. Affect is angry.        Speech: Speech normal.        Behavior: Behavior normal. Behavior is cooperative.        Thought Content: Thought content normal.        Cognition and Memory: Cognition and memory normal.        Judgment: Judgment is impulsive.    Review of Systems  Constitutional: Negative.   HENT: Negative.    Eyes: Negative.   Respiratory: Negative.    Cardiovascular: Negative.   Gastrointestinal: Negative.   Genitourinary: Negative.   Musculoskeletal: Negative.   Skin: Negative.   Neurological: Negative.   Endo/Heme/Allergies: Negative.   Psychiatric/Behavioral:  Positive for depression and suicidal ideas. The patient is nervous/anxious.    Blood pressure 130/89, pulse 89, temperature 97.7 F (36.5 C), temperature source Oral, resp. rate 18, height 5\' 6"  (1.676 m), weight 79.4 kg, SpO2 99%. Body mass index is 28.25 kg/m.  Medical Decision Making: Patient denies SI/HI/AVH.  Patient has hx of previous suicide attempts.  Patient meets criteria for inpatient Psychiatric care.  We will fax out records for bed  placement. Some home Medications are resumed and we will resume her Klonopin when she is fully awake  Problem 1: Bipolar 1 disorder, mixed  Problem 2: Drug OD, Intentional  Disposition:  Admit, seek bed placement.  Earney Navy, NP-PMHNP-BC. 07/08/2023 1:54 PM

## 2023-07-08 NOTE — ED Notes (Addendum)
Unable to remove rings from pts fingers-charge RN is aware

## 2023-07-08 NOTE — ED Notes (Signed)
CBG 348 @ 0654

## 2023-07-08 NOTE — ED Provider Notes (Signed)
Care handoff received from Berle Mull, PA-C at shift change.  Please see previous writer's note for full details of visit.  In short patient has a history of type 1 diabetes, asthma, GERD, depression.  She presented with suicidal ideations, she got into an argument with her husband and took 6 clonazepam's, 0.5 mg.  This occurred around midnight.  No other substance use.  She reports that she is not very compliant with her insulin and that her insulin pump is not working.  No other complaints today, patient was somnolent but arousable on previous providers exam.  Labs were ordered, patient noted to be hyperglycemic, also showed hyponatremia which is likely from the hyperglycemia.  Otherwise labs were reassuring.  CT head was interpreted as negative.  Patient was placed on cardiac monitor.  IV fluids and insulin were ordered by previous provider.  CBG was improved to 302.  Her nonfunctioning insulin pump was removed.  Poison control was consulted and it was recommended observation for 4 hours until back to baseline.  I have been asked to monitor this patient and once once back to baseline admit to psychiatry.  Diabetic coordinator consult placed. Physical Exam  BP 130/89   Pulse 89   Temp 97.7 F (36.5 C) (Oral)   Resp 18   Ht 5\' 6"  (1.676 m)   Wt 79.4 kg   SpO2 99%   BMI 28.25 kg/m   Physical Exam Constitutional:      General: She is not in acute distress.    Appearance: Normal appearance. She is not ill-appearing.  HENT:     Head: Normocephalic and atraumatic.     Nose: Nose normal.     Mouth/Throat:     Mouth: Mucous membranes are moist.     Pharynx: Oropharynx is clear.  Eyes:     Extraocular Movements: Extraocular movements intact.     Conjunctiva/sclera: Conjunctivae normal.     Pupils: Pupils are equal, round, and reactive to light.  Pulmonary:     Effort: Pulmonary effort is normal.  Musculoskeletal:        General: Normal range of motion.     Cervical back: Normal range  of motion and neck supple.  Skin:    General: Skin is warm and dry.     Capillary Refill: Capillary refill takes less than 2 seconds.  Neurological:     General: No focal deficit present.     Mental Status: She is alert and oriented to person, place, and time.  Psychiatric:        Mood and Affect: Mood normal.     Procedures  Procedures  ED Course / MDM   Clinical Course as of 07/08/23 0935  Sun Jul 08, 2023  1610 Hyperglycemia admit to psych [BM]  0900 Patient reassessed, alert and oriented no acute distress.  Significant other at bedside.  Vital signs stable. [BM]  0930 Consult with diabetic coordinator Arlee Muslim, RN.  Recommends discontinuing NovoLog 70/30.  Recommend starting patient on NovoLog sensitive every 4 hours along with basal Semglee 10 units twice daily [BM]    Clinical Course User Index [BM] Bill Salinas, PA-C   Medical Decision Making 37 year old female history as above presented following intentional overdose of clonazepam last night after an argument.  Labs and imaging obtained by previous provider.  Hypoglycemia was found without evidence of DKA or HHS.  IV medications were provided with improvement of hyperglycemia.  Diabetes coordinator has helped coordinate medications when patient is in hospital.  I have rechecked the patient multiple times, she is fully alert and oriented, vital signs are stable, she is in no acute distress.  At this time she appears medically stable for psychiatric consult and admission.  Patient is voluntary at this time however as per previous plan if she attempts to leave prior to psychiatric assessment will IVC.  CBG improving to 238 Repeat BMP shows improvement of hyponatremia, 132.  Improvement of hyperglycemia to 91.  No emergent lecture derangement, AKI or gap.  Doubt DKA. Urinalysis shows glucose otherwise negative, no ketones. CBC without leukocytosis, anemia or thrombocytopenia. Tylenol and salicylate levels  negative. Ethanol level negative, doubt EtOH withdrawal. Pregnancy test negative. Magnesium within normal limits. I personally reviewed patient's CT head, I do not appreciate any obvious intracranial hemorrhage.  Amount and/or Complexity of Data Reviewed Labs: ordered. Radiology: ordered.  Risk Prescription drug management.    Note: Portions of this report may have been transcribed using voice recognition software. Every effort was made to ensure accuracy; however, inadvertent computerized transcription errors may still be present.  As per previous plan     Elizabeth Palau 07/08/23 1610    Maia Plan, MD 07/17/23 [phone]

## 2023-07-08 NOTE — ED Notes (Signed)
Pt was unable to ambulate to bathroom d/t unsteady gait and sleepiness. MD made aware.

## 2023-07-08 NOTE — ED Notes (Signed)
Dinner tray was given.Patient was asleep.

## 2023-07-08 NOTE — ED Notes (Signed)
Patient stated that she doesn't have to urinate. And when she feels like urinating she will notify us.

## 2023-07-08 NOTE — ED Provider Notes (Signed)
[place] EMERGENCY DEPARTMENT AT [facility] Provider Note   CSN: [id number] Arrival date & time: 07/08/23  0257     History  Chief Complaint  Patient presents with   Ingestion    [name] is a 37 y.o. female.  HPI   Patient with medical history including asthma, type 1 diabetes, GERD, depression, presenting with complaints of suicidal ideations.  States that she got into an argument with her husband and is having some family issues and an attempt to hurt herself she took 6 0.5 mg of clonazepam around midnight, states that she did not take anything else, she denies any alcohol use or any other illicit drug use, not endorse any chest pain shortness of breath stomach pains nausea or vomiting diarrhea.  Patient has no other complaints.  Patient does meet that she has not been very compliant with her insulin, states that her pump has not been working properly. Home Medications Prior to Admission medications   Medication Sig Start Date End Date Taking? Authorizing Provider  albuterol (PROVENTIL) (2.5 MG/3ML) 0.083% nebulizer solution Take 3 mLs (2.5 mg total) by nebulization every 6 (six) hours as needed for wheezing or shortness of breath. 06/24/20   Donita Brooks, MD  albuterol (VENTOLIN HFA) 108 (90 Base) MCG/ACT inhaler TAKE 2 PUFFS BY MOUTH EVERY 6 HOURS AS NEEDED FOR WHEEZE OR SHORTNESS OF BREATH 11/08/20   Donita Brooks, MD  ammonium lactate (AMLACTIN) 12 % lotion Apply 1 Application topically as needed for dry skin. 09/25/22   Vivi Barrack, DPM  amphetamine-dextroamphetamine (ADDERALL) 20 MG tablet Take 20 mg by mouth daily.    [provider]  Budeson-Glycopyrrol-Formoterol (BREZTRI AEROSPHERE) 160-9-4.8 MCG/ACT AERO Inhale 2 puffs into the lungs 2 (two) times daily. 04/06/22   [provider]  Cariprazine HCl (VRAYLAR) 4.5 MG CAPS Take 1 capsule (4.5 mg total) by mouth daily. 05/28/23   Cottle, Steva Ready., MD  carvedilol  (COREG) 6.25 MG tablet Take 6.25 mg by mouth 2 (two) times daily. 03/10/22   [provider]  clonazePAM (KLONOPIN) 0.5 MG tablet Take 1 tablet (0.5 mg total) by mouth every 8 (eight) hours as needed. for anxiety 03/27/23   Cottle, Steva Ready., MD  FASENRA 30 MG/ML SOSY Inject into the skin. Every 3 months    [provider]  fluticasone (FLONASE) 50 MCG/ACT nasal spray Place 2 sprays into both nostrils daily. Patient taking differently: Place 2 sprays into both nostrils daily as needed (seasonal allergies.). 11/12/18   Danelle Berry, PA-C  furosemide (LASIX) 40 MG tablet Take 40 mg by mouth every other day.     [provider]  GLUCAGON EMERGENCY 1 MG injection Inject 1 mg into the skin as needed (hypoglycemia.). 04/30/19   [provider]  glucose blood (ACCU-CHEK GUIDE) test strip Use to check blood sugar 3 time a day 01/03/23     Insulin Human (INSULIN PUMP) SOLN Inject 1 each into the skin 3 times daily with meals, bedtime and 2 AM. Novolog: 36-38 units per day    [provider]  insulin lispro (ADMELOG) 100 UNIT/ML injection Use under the skin according to insulin pump settings. Use up to 150 units per day. 05/28/23   Donita Brooks, MD  insulin lispro (HUMALOG) 100 UNIT/ML injection Inject up to 150 units daily according to insulin pump settings. 12/27/22     Insulin Pen Needle 32G X 4 MM MISC 1 each by Does not apply route  once a week. 04/15/20   Helane Rima, DO  levonorgestrel (MIRENA, 52 MG,) 20 MCG/24HR IUD 1 each by Intrauterine route once.    [provider]  losartan (COZAAR) 25 MG tablet Take 25 mg by mouth daily. 03/10/22   [provider]  montelukast (SINGULAIR) 10 MG tablet Take 1 tablet (10 mg total) by mouth at bedtime. 01/17/21   Donita Brooks, MD  NOVOLOG 100 UNIT/ML injection Inject 36-38 Units into the skin daily. Via insulin pump per sliding scale 10/04/18   [provider]  pantoprazole (PROTONIX) 40 MG  tablet Take 1 tablet (40 mg total) by mouth daily. 11/02/22   Donita Brooks, MD  pravastatin (PRAVACHOL) 20 MG tablet SMARTSIG:1 Tablet(s) By Mouth Every Evening 12/03/20   [provider]  promethazine (PHENERGAN) 25 MG tablet Take 1 tablet (25 mg total) by mouth every 8 (eight) hours as needed for nausea or vomiting. 03/16/20   Sherrilyn Rist, MD  sertraline (ZOLOFT) 100 MG tablet Take 1 tablet (100 mg total) by mouth daily. 05/28/23   Cottle, Steva Ready., MD  tirzepatide [facility]) 5 MG/0.5ML Pen Inject 5 mg into the skin once a week. 11/06/22     tiZANidine (ZANAFLEX) 4 MG tablet Take 4 mg by mouth 2 (two) times daily as needed for muscle spasms. 12/07/19   [provider]  Ubrogepant (UBRELVY) 100 MG TABS Take 1 tablet (100 mg total) by mouth daily as needed (migraine headaches.). 11/02/22   Donita Brooks, MD      Allergies    Nsaids, Other, Toradol [ketorolac tromethamine], and Ceftin    Review of Systems   Review of Systems  Constitutional:  Negative for chills and fever.  Respiratory:  Negative for shortness of breath.   Cardiovascular:  Negative for chest pain.  Gastrointestinal:  Negative for abdominal pain.  Neurological:  Negative for headaches.  Psychiatric/Behavioral:  Positive for self-injury.     Physical Exam Updated Vital Signs BP (!) 155/90   Pulse 92   Temp 97.7 F (36.5 C) (Oral)   Resp 18   Ht 5\' 6"  (1.676 m)   Wt 79.4 kg   SpO2 100%   BMI 28.25 kg/m  Physical Exam Vitals and nursing note reviewed.  Constitutional:      General: She is not in acute distress.    Appearance: She is not ill-appearing.  HENT:     Head: Normocephalic and atraumatic.     Nose: No congestion.  Eyes:     Conjunctiva/sclera: Conjunctivae normal.  Cardiovascular:     Rate and Rhythm: Normal rate and regular rhythm.     Pulses: Normal pulses.     Heart sounds: No murmur heard.    No friction rub. No gallop.  Pulmonary:     Effort: No respiratory  distress.     Breath sounds: No wheezing, rhonchi or rales.  Abdominal:     Palpations: Abdomen is soft.     Tenderness: There is no abdominal tenderness. There is no right CVA tenderness or left CVA tenderness.  Skin:    General: Skin is warm and dry.  Neurological:     Mental Status: She is alert.     Comments: Patient is somnolent on exam but easily arousable, she is protecting her airway, alert and oriented x 4, no facial asymmetry, able follow two-step commands, no without weakness present.  Psychiatric:        Mood and Affect: Mood normal.  Comments: Somnolent but arousable, admits to passive suicidal ideation, denies homicidal ideation, does not appear to respond to internal stimuli.     ED Results / Procedures / Treatments   Labs (all labs ordered are listed, but only abnormal results are displayed) Labs Reviewed  COMPREHENSIVE METABOLIC PANEL - Abnormal; Notable for the following components:      Result Value   Sodium 128 (*)    Chloride 95 (*)    Glucose, Bld 528 (*)    BUN 25 (*)    Creatinine, Ser 1.24 (*)    Total Protein 6.4 (*)    GFR, Estimated 57 (*)    All other components within normal limits  SALICYLATE LEVEL - Abnormal; Notable for the following components:   Salicylate Lvl <7.0 (*)    All other components within normal limits  ACETAMINOPHEN LEVEL - Abnormal; Notable for the following components:   Acetaminophen (Tylenol), Serum <10 (*)    All other components within normal limits  CBC WITH DIFFERENTIAL/PLATELET - Abnormal; Notable for the following components:   RBC 5.17 (*)    All other components within normal limits  URINALYSIS, ROUTINE W REFLEX MICROSCOPIC - Abnormal; Notable for the following components:   Color, Urine STRAW (*)    Glucose, UA >=500 (*)    All other components within normal limits  BLOOD GAS, VENOUS - Abnormal; Notable for the following components:   Bicarbonate 28.5 (*)    Acid-Base Excess 3.0 (*)    All other components  within normal limits  CBG MONITORING, ED - Abnormal; Notable for the following components:   Glucose-Capillary 452 (*)    All other components within normal limits  CBG MONITORING, ED - Abnormal; Notable for the following components:   Glucose-Capillary 507 (*)    All other components within normal limits  ETHANOL  RAPID URINE DRUG SCREEN, HOSP PERFORMED  HCG, SERUM, QUALITATIVE  MAGNESIUM  CBG MONITORING, ED  CBG MONITORING, ED    EKG None  Radiology CT Head Wo Contrast  Result Date: 07/08/2023 CLINICAL DATA:  37 year old female with altered mental status, self-harm. EXAM: CT HEAD WITHOUT CONTRAST TECHNIQUE: Contiguous axial images were obtained from the base of the skull through the vertex without intravenous contrast. RADIATION DOSE REDUCTION: This exam was performed according to the departmental dose-optimization program which includes automated exposure control, adjustment of the mA and/or kV according to patient size and/or use of iterative reconstruction technique. COMPARISON:  None Available. FINDINGS: Brain: Cerebral volume within normal limits. No midline shift, ventriculomegaly, mass effect, evidence of mass lesion, intracranial hemorrhage or evidence of cortically based acute infarction. Gray-white matter differentiation is within normal limits throughout the brain. Vascular: Calcified atherosclerosis at the skull base. No suspicious intracranial vascular hyperdensity. Skull: Negative. Sinuses/Orbits: Visualized paranasal sinuses and mastoids are clear. Other: Postoperative changes to both globes. No acute orbit or scalp soft tissue finding. IMPRESSION: 1. Normal noncontrast CT appearance of the brain. 2. Calcified atherosclerosis at the skull base seems advanced for age. Electronically Signed   By: Odessa Fleming M.D.   On: 07/08/2023 07:16    Procedures Procedures    Medications Ordered in ED Medications  insulin aspart protamine- aspart (NOVOLOG MIX 70/30) injection 10 Units (has  no administration in time range)  Cariprazine HCl CAPS 4.5 mg (has no administration in time range)  losartan (COZAAR) tablet 25 mg (has no administration in time range)  sertraline (ZOLOFT) tablet 100 mg (has no administration in time range)  tirzepatide (MOUNJARO) Pen 5 mg (  has no administration in time range)  Ubrogepant TABS 100 mg (has no administration in time range)  sodium chloride 0.9 % bolus 1,000 mL (1,000 mLs Intravenous New Bag/Given 07/08/23 0550)  insulin aspart protamine- aspart (NOVOLOG MIX 70/30) injection 10 Units (10 Units Subcutaneous Given 07/08/23 0548)    ED Course/ Medical Decision Making/ A&P Clinical Course as of 07/08/23 0741  Sun Jul 08, 2023  0647 Hyperglycemia admit to psych [BM]    Clinical Course User Index [BM] Bill Salinas, PA-C                                 Medical Decision Making Amount and/or Complexity of Data Reviewed Labs: ordered. Radiology: ordered.  Risk Prescription drug management.   This patient presents to the ED for concern of suicidal ideation, this involves an extensive number of treatment options, and is a complaint that carries with it a high risk of complications and morbidity.  The differential diagnosis includes psychiatric emergency, metabolic abnormality, withdraws    Additional history obtained:  Additional history obtained from N/A External records from outside source obtained and reviewed including PCP notes   Co morbidities that complicate the patient evaluation  Diabetes, depression  Social Determinants of Health:  N/A    Lab Tests:  I Ordered, and personally interpreted labs.  The pertinent results include: CBC unremarkable, CMP reveals sodium of 128, chloride 95, glucose 528, BUN 25, creatinine 1.2, GFR 57, magnesium 2.1, hCG negative, ethanol negative, acetaminophen negative, VBG unremarkable, UA unremarkable, rapid urine drug screen negative   Imaging Studies ordered:  I ordered imaging  studies including CT head I independently visualized and interpreted imaging which showed CT head is negative acute findings I agree with the radiologist interpretation   Cardiac Monitoring:  The patient was maintained on a cardiac monitor.  I personally viewed and interpreted the cardiac monitored which showed an underlying rhythm of: N/A   Medicines ordered and prescription drug management:  I ordered medication including fluids, insulin I have reviewed the patients home medicines and have made adjustments as needed  Critical Interventions:  N/A   Reevaluation:  Presents after suicide ideations, took Klonopin, nursing staff spoke with poison control, observation for 4 hours or until she is back to her baseline.  Lab work was obtained via triage, patient is increase glucose of 507, no anion gap present, will add on a UA, VBG, provide with insulin and fluids and reassess  Patient remains somnolent but easily arousable, she is protecting her airway, repeat CBG reveals 302, will provide her with additional 10 units of insulin, and continue to monitor.  Patient's pump was removed and will have diabetic coronary come and assess the patient.   Consultations Obtained:  N/A    Test Considered:  N/A    Rule out Suspicion for withdrawals low at this time, vital signs reassuring, nontremulous on my exam.  I doubt DKA or HSS no anion gap, no decrease in pH, no ketones in urine, would expect significant elevation in glucose for exercise.  Doubt pancreatitis cholecystitis cholangitis she has no abdominal pain no elevation in liver signs alk phos or T. bili.  Doubt intracranial head bleed or CVA no focal deficits CT imaging is negative for acute findings.  Dispostion and problem list  Due to shift change patient handoff to Lorelee Market, PA-C  Reassessed patient after second dose of NovoLog, as long as glucose has dropped low  300, patient is back to her baseline, she will be  medically cleared, she is here voluntarily if she attempts to leave would recommend IVC.            Final Clinical Impression(s) / ED Diagnoses Final diagnoses:  Suicide attempt [facility])  Hyperglycemia    Rx / DC Orders ED Discharge Orders     None         Carroll Sage, PA-C 07/08/23 0741    Nira Conn, MD 07/09/23 [phone]

## 2023-07-08 NOTE — Inpatient Diabetes Management (Addendum)
Inpatient Diabetes Program Recommendations  AACE/ADA: New Consensus Statement on Inpatient Glycemic Control (2015)  Target Ranges:  Prepandial:   less than 140 mg/dL      Peak postprandial:   less than 180 mg/dL (1-2 hours)      Critically ill patients:  140 - 180 mg/dL   Lab Results  Component Value Date   GLUCAP 507 (HH) 07/08/2023   HGBA1C 9.3 (H) 11/02/2022    Review of Glycemic Control  Latest Reference Range & Units 07/08/23 03:05 07/08/23 05:44  Glucose-Capillary 70 - 99 mg/dL 409 (H) 811 (HH)   Diabetes history: DM 1 Outpatient Diabetes medications:  Insulin pump- unsure of settings (see's Dr. Sharl Ma) Current orders for Inpatient glycemic control:  70/30 10 units x 1  Inpatient Diabetes Program Recommendations:    Note history of Type 1 DM. Insulin pump is off (since?). Patient did receive one time dose of 70/30 this morning at 0548.    Will need basal/bolus insulin regimen while in the hospital. Consider adding Semglee 22 units daily (0.3 units/kg), Novolog sensitive correction q 4 hours, and once eating Novolog 4-5 units tid with meals.  May also consider recheck of BMP?   Will need to get insulin pump settings from Dr. Daune Perch office tomorrow.   Thanks,  Beryl Meager, RN, BC-ADM Inpatient Diabetes Coordinator Pager [phone]  (8a-5p)

## 2023-07-09 ENCOUNTER — Ambulatory Visit: Payer: Medicare Other | Admitting: Sports Medicine

## 2023-07-09 ENCOUNTER — Encounter (HOSPITAL_COMMUNITY): Payer: Self-pay | Admitting: Nurse Practitioner

## 2023-07-09 ENCOUNTER — Other Ambulatory Visit: Payer: Self-pay

## 2023-07-09 ENCOUNTER — Inpatient Hospital Stay (HOSPITAL_COMMUNITY)
Admission: AD | Admit: 2023-07-09 | Discharge: 2023-07-13 | DRG: 885 | Disposition: A | Discharge status: Home / Self Care | Payer: Medicare Other | Source: Intra-hospital | Attending: Psychiatry | Admitting: Psychiatry

## 2023-07-09 DIAGNOSIS — F332 Major depressive disorder, recurrent severe without psychotic features: Principal | ICD-10-CM | POA: Insufficient documentation

## 2023-07-09 DIAGNOSIS — Z8049 Family history of malignant neoplasm of other genital organs: Secondary | ICD-10-CM

## 2023-07-09 DIAGNOSIS — H5461 Unqualified visual loss, right eye, normal vision left eye: Secondary | ICD-10-CM | POA: Diagnosis present

## 2023-07-09 DIAGNOSIS — E1022 Type 1 diabetes mellitus with diabetic chronic kidney disease: Secondary | ICD-10-CM | POA: Diagnosis present

## 2023-07-09 DIAGNOSIS — Z9151 Personal history of suicidal behavior: Secondary | ICD-10-CM

## 2023-07-09 DIAGNOSIS — E1043 Type 1 diabetes mellitus with diabetic autonomic (poly)neuropathy: Secondary | ICD-10-CM | POA: Diagnosis present

## 2023-07-09 DIAGNOSIS — Z79899 Other long term (current) drug therapy: Secondary | ICD-10-CM | POA: Diagnosis not present

## 2023-07-09 DIAGNOSIS — Z83438 Family history of other disorder of lipoprotein metabolism and other lipidemia: Secondary | ICD-10-CM | POA: Diagnosis not present

## 2023-07-09 DIAGNOSIS — Z8249 Family history of ischemic heart disease and other diseases of the circulatory system: Secondary | ICD-10-CM

## 2023-07-09 DIAGNOSIS — Z7985 Long-term (current) use of injectable non-insulin antidiabetic drugs: Secondary | ICD-10-CM

## 2023-07-09 DIAGNOSIS — J45909 Unspecified asthma, uncomplicated: Secondary | ICD-10-CM | POA: Diagnosis present

## 2023-07-09 DIAGNOSIS — Z803 Family history of malignant neoplasm of breast: Secondary | ICD-10-CM | POA: Diagnosis not present

## 2023-07-09 DIAGNOSIS — F314 Bipolar disorder, current episode depressed, severe, without psychotic features: Secondary | ICD-10-CM | POA: Diagnosis not present

## 2023-07-09 DIAGNOSIS — Z800 Family history of malignant neoplasm of digestive organs: Secondary | ICD-10-CM | POA: Diagnosis not present

## 2023-07-09 DIAGNOSIS — T424X2A Poisoning by benzodiazepines, intentional self-harm, initial encounter: Secondary | ICD-10-CM | POA: Diagnosis present

## 2023-07-09 DIAGNOSIS — N183 Chronic kidney disease, stage 3 unspecified: Secondary | ICD-10-CM | POA: Diagnosis present

## 2023-07-09 DIAGNOSIS — E1065 Type 1 diabetes mellitus with hyperglycemia: Secondary | ICD-10-CM | POA: Diagnosis present

## 2023-07-09 DIAGNOSIS — F411 Generalized anxiety disorder: Secondary | ICD-10-CM | POA: Diagnosis present

## 2023-07-09 DIAGNOSIS — F3163 Bipolar disorder, current episode mixed, severe, without psychotic features: Secondary | ICD-10-CM | POA: Diagnosis present

## 2023-07-09 DIAGNOSIS — I129 Hypertensive chronic kidney disease with stage 1 through stage 4 chronic kidney disease, or unspecified chronic kidney disease: Secondary | ICD-10-CM | POA: Diagnosis present

## 2023-07-09 DIAGNOSIS — Z634 Disappearance and death of family member: Secondary | ICD-10-CM

## 2023-07-09 DIAGNOSIS — K3184 Gastroparesis: Secondary | ICD-10-CM | POA: Diagnosis present

## 2023-07-09 DIAGNOSIS — Z794 Long term (current) use of insulin: Secondary | ICD-10-CM

## 2023-07-09 DIAGNOSIS — Z833 Family history of diabetes mellitus: Secondary | ICD-10-CM | POA: Diagnosis not present

## 2023-07-09 DIAGNOSIS — Z9641 Presence of insulin pump (external) (internal): Secondary | ICD-10-CM | POA: Diagnosis present

## 2023-07-09 DIAGNOSIS — K5900 Constipation, unspecified: Secondary | ICD-10-CM | POA: Diagnosis present

## 2023-07-09 LAB — CBG MONITORING, ED
Glucose-Capillary: 464 mg/dL — ABNORMAL HIGH (ref 70–99)
Glucose-Capillary: 348 mg/dL — ABNORMAL HIGH (ref 70–99)
Glucose-Capillary: 378 mg/dL — ABNORMAL HIGH (ref 70–99)
Glucose-Capillary: 191 mg/dL — ABNORMAL HIGH (ref 70–99)
Glucose-Capillary: 138 mg/dL — ABNORMAL HIGH (ref 70–99)
Glucose-Capillary: 120 mg/dL — ABNORMAL HIGH (ref 70–99)

## 2023-07-09 LAB — GLUCOSE, CAPILLARY
Glucose-Capillary: 320 mg/dL — ABNORMAL HIGH (ref 70–99)
Glucose-Capillary: 302 mg/dL — ABNORMAL HIGH (ref 70–99)

## 2023-07-09 MED ORDER — INSULIN GLARGINE-YFGN 100 UNIT/ML ~~LOC~~ SOLN
10.0000 [IU] | Freq: Two times a day (BID) | SUBCUTANEOUS | Status: DC
  Administered 2023-07-09 – 2023-07-10 (×2): 10 [IU] via SUBCUTANEOUS

## 2023-07-09 MED ORDER — UBROGEPANT 100 MG PO TABS: 100.0000 mg | ORAL_TABLET | Freq: Every day | ORAL | Status: DC | PRN

## 2023-07-09 MED ORDER — HALOPERIDOL 5 MG PO TABS: 5.0000 mg | ORAL_TABLET | Freq: Three times a day (TID) | ORAL | Status: DC | PRN

## 2023-07-09 MED ORDER — CARIPRAZINE HCL 1.5 MG PO CAPS
4.5000 mg | ORAL_CAPSULE | Freq: Every day | ORAL | Status: DC
  Filled 2023-07-09 (×2): qty 3

## 2023-07-09 MED ORDER — ONDANSETRON 4 MG PO TBDP: 4.0000 mg | ORAL_TABLET | Freq: Three times a day (TID) | ORAL | Status: DC | PRN

## 2023-07-09 MED ORDER — LORAZEPAM 2 MG/ML IJ SOLN: 2.0000 mg | Freq: Three times a day (TID) | INTRAMUSCULAR | Status: DC | PRN

## 2023-07-09 MED ORDER — ACETAMINOPHEN 325 MG PO TABS
650.0000 mg | ORAL_TABLET | Freq: Four times a day (QID) | ORAL | Status: DC | PRN
  Administered 2023-07-10 – 2023-07-11 (×2): 650 mg via ORAL
  Filled 2023-07-09 (×2): qty 2

## 2023-07-09 MED ORDER — LORAZEPAM 1 MG PO TABS: 2.0000 mg | ORAL_TABLET | Freq: Three times a day (TID) | ORAL | Status: DC | PRN

## 2023-07-09 MED ORDER — LOSARTAN POTASSIUM 25 MG PO TABS
25.0000 mg | ORAL_TABLET | Freq: Every day | ORAL | Status: DC
  Administered 2023-07-10 – 2023-07-13 (×4): 25 mg via ORAL
  Filled 2023-07-09 (×5): qty 1

## 2023-07-09 MED ORDER — HALOPERIDOL LACTATE 5 MG/ML IJ SOLN: 5.0000 mg | Freq: Three times a day (TID) | INTRAMUSCULAR | Status: DC | PRN

## 2023-07-09 MED ORDER — INSULIN ASPART 100 UNIT/ML IJ SOLN
0.0000 [IU] | Freq: Three times a day (TID) | INTRAMUSCULAR | Status: DC
  Administered 2023-07-09: 7 [IU] via SUBCUTANEOUS
  Administered 2023-07-10: 9 [IU] via SUBCUTANEOUS
  Administered 2023-07-10 – 2023-07-11 (×2): 3 [IU] via SUBCUTANEOUS

## 2023-07-09 MED ORDER — INSULIN ASPART 100 UNIT/ML IJ SOLN: 0.0000 [IU] | INTRAMUSCULAR | Status: DC

## 2023-07-09 MED ORDER — MAGNESIUM HYDROXIDE 400 MG/5ML PO SUSP: 30.0000 mL | Freq: Every day | ORAL | Status: DC | PRN

## 2023-07-09 MED ORDER — INSULIN ASPART 100 UNIT/ML IJ SOLN
0.0000 [IU] | Freq: Every day | INTRAMUSCULAR | Status: DC
  Administered 2023-07-09: 4 [IU] via SUBCUTANEOUS
  Administered 2023-07-10: 3 [IU] via SUBCUTANEOUS

## 2023-07-09 MED ORDER — DIPHENHYDRAMINE HCL 25 MG PO CAPS: 50.0000 mg | ORAL_CAPSULE | Freq: Three times a day (TID) | ORAL | Status: DC | PRN

## 2023-07-09 MED ORDER — SERTRALINE HCL 100 MG PO TABS
100.0000 mg | ORAL_TABLET | Freq: Every day | ORAL | Status: DC
  Administered 2023-07-10: 100 mg via ORAL
  Filled 2023-07-09 (×3): qty 1

## 2023-07-09 MED ORDER — DIPHENHYDRAMINE HCL 50 MG/ML IJ SOLN: 50.0000 mg | Freq: Three times a day (TID) | INTRAMUSCULAR | Status: DC | PRN

## 2023-07-09 MED ORDER — ALUM & MAG HYDROXIDE-SIMETH 200-200-20 MG/5ML PO SUSP
30.0000 mL | ORAL | Status: DC | PRN
  Administered 2023-07-12: 30 mL via ORAL
  Filled 2023-07-09: qty 30

## 2023-07-09 MED ORDER — INSULIN ASPART 100 UNIT/ML IJ SOLN
4.0000 [IU] | Freq: Three times a day (TID) | INTRAMUSCULAR | Status: DC
  Administered 2023-07-09 – 2023-07-10 (×3): 4 [IU] via SUBCUTANEOUS

## 2023-07-09 NOTE — ED Notes (Signed)
Patient off unit to [facility] per provider. Patient alert, calm, cooperative , no s/s of distress. Patient discharge information given to [facility] surgeon for transport. Patient ambulatory off unit, escorted by NT. Patient transported by [employer].

## 2023-07-09 NOTE — Progress Notes (Signed)
[name] 37 year old female admitted voluntarily to [facility] from [facility] due to overdosing on 6 tablets of 0.5mg  klonopin following an argument with her husband.   According to HPI: "She states she did not want to die but was overwhelmed and anxious. Patient is going through multiple medical issues as well-DM on Insulin pump, Chronic Kidney disease and she is still grieving the death of her father who died suddenly after a heart attack on a trip to visit his daughter patient's sister. Father died while speaking to this patient on the phone. Patient sees DR Jennelle Human for her Psychiatry care and Medications. She has been hospitalized once at [facility] and [facility]. Patient reports a suicide attempt by attempting to jump off [address] but her parents in 2019 pulled her back and called the Police. Patient denies family hx of Mental illness or suicide attempt."  Patient states she lives with her husband and 5 kids (3 of which are her stepchildren) ages 57,12,13,17, and 58. Patient is unemployed and is on disability. Patient endorses poor appetite states she sleeps too much and states she has been very depressed, anxious, and agitated lately. Patient currently denies SI,HI, and A/V/H and is cooperative on unit. Patient oriented to unit and provided with  meal. No s/s of current distress.

## 2023-07-09 NOTE — Group Note (Signed)
Date:  07/09/2023 Time:  11:33 PM  Group Topic/Focus:  Recovery Goals:   The focus of this group is to identify appropriate goals for recovery and establish a plan to achieve them.    Participation Level:  Active  Participation Quality:  Appropriate  Affect:  Appropriate  Cognitive:  Appropriate  Insight: Appropriate  Engagement in Group:  Supportive  Modes of Intervention:  Support  Additional Comments:    Hendrixx Severin 07/09/2023, 11:33 PM

## 2023-07-09 NOTE — Progress Notes (Signed)
Pt has been accepted to [facility] TODAY 07/09/2023. Bed assignment: 304-2  Pt meets inpatient criteria per Dahlia Byes, NP  Attending Physician will be Phineas Inches, MD  Report can be called to: - Adult unit: [phone]  Pt can arrive anytime; bed is ready now  Care Team Notified: [facility] Rona Ravens, RN, Tia Alert, RN, Dahlia Byes, NP, and Lum Babe, RN  [place], MSW, LCSW  07/09/2023 10:01 AM

## 2023-07-09 NOTE — Tx Team (Signed)
Initial Treatment Plan 07/09/2023 2:38 PM [name] ZOX:[id number]    PATIENT STRESSORS: Financial difficulties   Marital or family conflict     PATIENT STRENGTHS: Supportive family/friends    PATIENT IDENTIFIED PROBLEMS: Depression  Anxiety  Marital conflict                 DISCHARGE CRITERIA:  Improved stabilization in mood, thinking, and/or behavior  PRELIMINARY DISCHARGE PLAN: Return to previous living arrangement  PATIENT/FAMILY INVOLVEMENT: This treatment plan has been presented to and reviewed with the patient, [name].  The patient has been given the opportunity to ask questions and make suggestions.  Roseanne Reno, RN 07/09/2023, 2:38 PM

## 2023-07-09 NOTE — Inpatient Diabetes Management (Signed)
Inpatient Diabetes Program Recommendations  AACE/ADA: New Consensus Statement on Inpatient Glycemic Control (2015)  Target Ranges:  Prepandial:   less than 140 mg/dL      Peak postprandial:   less than 180 mg/dL (1-2 hours)      Critically ill patients:  140 - 180 mg/dL   Lab Results  Component Value Date   GLUCAP 120 (H) 07/09/2023   HGBA1C 10.8 (H) 07/08/2023    Review of Glycemic Control  Latest Reference Range & Units 07/09/23 01:40 07/09/23 04:06 07/09/23 07:48  Glucose-Capillary 70 - 99 mg/dL 161 (H) 096 (H) 045 (H)  (H): Data is abnormally high  Diabetes history: T1DM Outpatient Diabetes medications: Insulin Pump-Endocrinologist Dr. Sharl Ma Current orders for Inpatient glycemic control: None  Transferred to [facility] today from [facility] ED  Inpatient Diabetes Program Recommendations:    Please resume insulin orders:  Semglee 10 units BID Novolog 0-9 units TID and 0-5 units at bedtime Novolog 4 units TID with meals if she consumes at least 50%  Will continue to follow while inpatient.  Thank you, Dulce Sellar, MSN, CDCES Diabetes Coordinator Inpatient Diabetes Program [phone] (team pager from 8a-5p)

## 2023-07-09 NOTE — Plan of Care (Signed)
  Problem: Education: Goal: Knowledge of [facility] Education information/materials will improve Outcome: Progressing   Problem: Safety: Goal: Periods of time without injury will increase Outcome: Progressing   

## 2023-07-10 DIAGNOSIS — F314 Bipolar disorder, current episode depressed, severe, without psychotic features: Principal | ICD-10-CM | POA: Insufficient documentation

## 2023-07-10 LAB — GLUCOSE, CAPILLARY
Glucose-Capillary: 95 mg/dL (ref 70–99)
Glucose-Capillary: 416 mg/dL — ABNORMAL HIGH (ref 70–99)
Glucose-Capillary: 450 mg/dL — ABNORMAL HIGH (ref 70–99)
Glucose-Capillary: 218 mg/dL — ABNORMAL HIGH (ref 70–99)
Glucose-Capillary: 291 mg/dL — ABNORMAL HIGH (ref 70–99)

## 2023-07-10 MED ORDER — PRAVASTATIN SODIUM 20 MG PO TABS
20.0000 mg | ORAL_TABLET | Freq: Every day | ORAL | Status: DC
  Filled 2023-07-10: qty 1

## 2023-07-10 MED ORDER — SERTRALINE HCL 50 MG PO TABS
50.0000 mg | ORAL_TABLET | Freq: Every day | ORAL | Status: DC
  Administered 2023-07-11 – 2023-07-13 (×3): 50 mg via ORAL
  Filled 2023-07-10 (×5): qty 1

## 2023-07-10 MED ORDER — ASPIRIN 81 MG PO CHEW
81.0000 mg | CHEWABLE_TABLET | Freq: Every day | ORAL | Status: DC
  Administered 2023-07-10 – 2023-07-12 (×3): 81 mg via ORAL
  Filled 2023-07-10 (×5): qty 1

## 2023-07-10 MED ORDER — INSULIN GLARGINE-YFGN 100 UNIT/ML ~~LOC~~ SOLN
18.0000 [IU] | Freq: Two times a day (BID) | SUBCUTANEOUS | Status: DC
  Administered 2023-07-10 – 2023-07-11 (×2): 18 [IU] via SUBCUTANEOUS

## 2023-07-10 MED ORDER — CARIPRAZINE HCL 3 MG PO CAPS
3.0000 mg | ORAL_CAPSULE | Freq: Every day | ORAL | Status: DC
  Administered 2023-07-10 – 2023-07-12 (×3): 3 mg via ORAL
  Filled 2023-07-10 (×4): qty 1

## 2023-07-10 MED ORDER — INSULIN ASPART 100 UNIT/ML IJ SOLN
6.0000 [IU] | Freq: Three times a day (TID) | INTRAMUSCULAR | Status: DC
  Administered 2023-07-10 – 2023-07-11 (×3): 6 [IU] via SUBCUTANEOUS

## 2023-07-10 MED ORDER — CARIPRAZINE HCL 1.5 MG PO CAPS
4.5000 mg | ORAL_CAPSULE | Freq: Every day | ORAL | Status: DC
  Filled 2023-07-10 (×2): qty 3

## 2023-07-10 MED ORDER — MONTELUKAST SODIUM 10 MG PO TABS
10.0000 mg | ORAL_TABLET | Freq: Every day | ORAL | Status: DC
  Administered 2023-07-10 – 2023-07-12 (×3): 10 mg via ORAL
  Filled 2023-07-10 (×4): qty 1

## 2023-07-10 MED ORDER — PRAVASTATIN SODIUM 20 MG PO TABS
20.0000 mg | ORAL_TABLET | Freq: Every day | ORAL | Status: DC
  Administered 2023-07-10 – 2023-07-12 (×3): 20 mg via ORAL
  Filled 2023-07-10 (×4): qty 1

## 2023-07-10 NOTE — Group Note (Signed)
Recreation Therapy Group Note   Group Topic:Animal Assisted Therapy   Group Date: 07/10/2023 Start Time: 0946 End Time: 1030 Facilitators: Rylynne Schicker-McCall, LRT,CTRS Location: [address] Dayroom   Animal-Assisted Activity (AAA) Program Checklist/Progress Notes Patient Eligibility Criteria Checklist & Daily Group note for Rec Tx Intervention  AAA/T Program Assumption of Risk Form signed by Patient/ or Parent Legal Guardian Yes  Patient is free of allergies or severe asthma Yes  Patient reports no fear of animals Yes  Patient reports no history of cruelty to animals Yes  Patient understands his/her participation is voluntary Yes  Patient washes hands before animal contact Yes  Patient washes hands after animal contact Yes  Education: Hand Washing, Appropriate Animal Interaction   Education Outcome: Acknowledges education.    Affect/Mood: Appropriate   Participation Level: Engaged   Participation Quality: Independent   Behavior: Appropriate   Speech/Thought Process: Focused   Insight: Good   Judgement: Good   Modes of Intervention: Teaching laboratory technician   Patient Response to Interventions:  Engaged   Education Outcome:  In group clarification offered    Clinical Observations/Individualized Feedback: Patient attended session and interacted appropriately with therapy dog and peers. Patient asked appropriate questions about therapy dog and his training. Patient shared stories about their pets at home with group.      Plan: Continue to engage patient in RT group sessions 2-3x/week.   [name], LRT,CTRS 07/10/2023 12:55 PM
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
## 2023-07-10 NOTE — BHH Group Notes (Signed)
Adult Psychoeducational Group Note  Date:  07/10/2023 Time:  9:07 PM  Group Topic/Focus:  Wrap-Up Group:   The focus of this group is to help patients review their daily goal of treatment and discuss progress on daily workbooks.  Participation Level:  Active  Participation Quality:  Appropriate  Affect:  Appropriate  Cognitive:  Appropriate  Insight: Appropriate  Engagement in Group:  Engaged  Modes of Intervention:  Discussion and Support  Additional Comments:  Pt told that today was a good day on the unit, the highlight of which was hanging out in the courtyard with her peers and listening to music. On the subject of staying well upon discharge, Pt mentioned wanting to resume seeing a therapist on a regular basis. Pt rated her day a 7 out of 10.  Christ Kick 07/10/2023, 9:07 PM

## 2023-07-10 NOTE — Progress Notes (Signed)
   07/10/23 0027  Psych Admission Type (Psych Patients Only)  Admission Status Voluntary  Psychosocial Assessment  Patient Complaints Depression  Eye Contact Fair  Facial Expression Anxious  Affect Depressed;Anxious  Speech Logical/coherent  Interaction Assertive  Motor Activity Slow  Appearance/Hygiene In scrubs  Behavior Characteristics Appropriate to situation;Cooperative  Mood Depressed;Anxious  Thought Process  Coherency WDL  Content WDL  Delusions None reported or observed  Perception WDL  Hallucination None reported or observed  Judgment Limited  Confusion None  Danger to Self  Current suicidal ideation? Denies  Agreement Not to Harm Self Yes  Description of Agreement verbal  Danger to Others  Danger to Others None reported or observed

## 2023-07-10 NOTE — Inpatient Diabetes Management (Signed)
Inpatient Diabetes Program Recommendations  AACE/ADA: New Consensus Statement on Inpatient Glycemic Control (2015)  Target Ranges:  Prepandial:   less than 140 mg/dL      Peak postprandial:   less than 180 mg/dL (1-2 hours)      Critically ill patients:  140 - 180 mg/dL   Lab Results  Component Value Date   GLUCAP 416 (H) 07/10/2023   HGBA1C 10.8 (H) 07/08/2023    Review of Glycemic Control  Latest Reference Range & Units 07/09/23 07:48 07/09/23 17:00 07/09/23 21:31 07/10/23 06:24 07/10/23 08:43  Glucose-Capillary 70 - 99 mg/dL 161 (H) 096 (H) 045 (H) 95 416 (H)  (H): Data is abnormally high  Diabetes history: T1DM Outpatient Diabetes medications:  Insulin pump Endocrinologist-Dr. Sharl Ma Current orders for Inpatient glycemic control:  Semglee 10 units BID, Novolog 0-9 units TID and 0-5 units at bedtime, Novolog 4 units TID with meals   Inpatient Diabetes Program Recommendations:    Patient did not have a noon CBG and correction yesterday which is likely why she was so high (320 mg/dL) at 5pm (type 1 DM). Her fasting was 95 mg/dL this am. They checked her again this morning at 08:43 and she was 416 mg/dL. She likely ate prior to the 416 mg/dL. I would recommend keeping regimen as it is for now and encourage CBG's as prescribed--ac/hs. I will check back on her at noon today.   RN secure chatted me and did tell me she ate breakfast prior to the 416 mg/dL CBG.  Dr. Sherron Flemings aware.    Will continue to follow while inpatient.  Thank you, Dulce Sellar, MSN, CDCES Diabetes Coordinator Inpatient Diabetes Program [phone] (team pager from 8a-5p)

## 2023-07-10 NOTE — Progress Notes (Signed)
Patient's mother, Albina Billet [phone] called asking when patient will be discharged. Mother reports that patient has a lot of outside support including family members and therapist. Mother requests a call from patient's provider when able.

## 2023-07-10 NOTE — Plan of Care (Signed)
  Problem: Education: Goal: Knowledge of [facility] Education information/materials will improve Outcome: Progressing Goal: Emotional status will improve Outcome: Progressing Goal: Mental status will improve Outcome: Progressing Goal: Verbalization of understanding the information provided will improve Outcome: Progressing   

## 2023-07-10 NOTE — Group Note (Unsigned)
Date:  07/10/2023 Time:  5:19 PM  Group Topic/Focus:  Goals Group:   The focus of this group is to help patients establish daily goals to achieve during treatment and discuss how the patient can incorporate goal setting into their daily lives to aide in recovery. Orientation:   The focus of this group is to educate the patient on the purpose and policies of crisis stabilization and provide a format to answer questions about their admission.  The group details unit policies and expectations of patients while admitted.     Participation Level:  {BHH PARTICIPATION UEAVW:09811}  Participation Quality:  {BHH PARTICIPATION QUALITY:22265}  Affect:  {BHH AFFECT:22266}  Cognitive:  {BHH COGNITIVE:22267}  Insight: {BHH Insight2:20797}  Engagement in Group:  {BHH ENGAGEMENT IN BJYNW:29562}  Modes of Intervention:  {BHH MODES OF INTERVENTION:22269}  Additional Comments:  ***  Raylyn Carton M Anagha Loseke 07/10/2023, 5:19 PM

## 2023-07-10 NOTE — Progress Notes (Signed)
Patient stated she takes vraylar 4.5 mg at night (med makes her sleepy) and also pravastatin at night.

## 2023-07-10 NOTE — Progress Notes (Addendum)
Patient's blood sugar is 450 at 1130 this afternoon. Pt reports that she has eaten eggs, grits, and a cup of coffee today. MD notified and ordered to administer 13 units of insulin aspart prior to lunch. Insulin administered per MD order.

## 2023-07-10 NOTE — Group Note (Signed)
Date:  07/10/2023 Time:  12:57 PM  Group Topic/Focus:  Goals Group:   The focus of this group is to help patients establish daily goals to achieve during treatment and discuss how the patient can incorporate goal setting into their daily lives to aide in recovery. Orientation:   The focus of this group is to educate the patient on the purpose and policies of crisis stabilization and provide a format to answer questions about their admission.  The group details unit policies and expectations of patients while admitted.    Participation Level:  Active  Participation Quality:  Appropriate  Affect:  Appropriate  Cognitive:  Appropriate  Insight: Appropriate  Engagement in Group:  Engaged  Modes of Intervention:  Discussion, Orientation, and Rapport Building  Additional Comments:   Pt attended and participated in the Orientation/Goals group. Pt personal goal is to increase self-love.  [name] 07/10/2023, 12:57 PM

## 2023-07-10 NOTE — Progress Notes (Signed)
   07/10/23 0900  Psych Admission Type (Psych Patients Only)  Admission Status Voluntary  Psychosocial Assessment  Patient Complaints Depression;Anxiety  Eye Contact Fair  Facial Expression Anxious;Sad  Affect Anxious;Depressed  Speech Logical/coherent  Interaction Assertive  Motor Activity Slow  Appearance/Hygiene Unremarkable  Behavior Characteristics Cooperative;Appropriate to situation  Mood Depressed;Anxious  Thought Process  Coherency WDL  Content WDL  Delusions None reported or observed  Perception WDL  Hallucination None reported or observed  Judgment Impaired  Confusion None  Danger to Self  Current suicidal ideation? Denies  Agreement Not to Harm Self Yes  Description of Agreement Verbal  Danger to Others  Danger to Others None reported or observed

## 2023-07-10 NOTE — BHH Suicide Risk Assessment (Signed)
BHH INPATIENT:  Family/Significant Other Suicide Prevention Education  Suicide Prevention Education:  Education Completed;07-10-2023,  [name] [phone] (Mother) has been identified by the patient as the family member/significant other with whom the patient will be residing, and identified as the person(Cristina) who will aid the patient in the event of a mental health crisis (suicidal ideations/suicide attempt).  With written consent from the patient, the family member/significant other has been provided the following suicide prevention education, prior to the and/or following the discharge of the patient.  The suicide prevention education provided includes the following: Suicide risk factors Suicide prevention and interventions National Suicide Hotline telephone number [facility] assessment telephone number [facility] Emergency Assistance 911 [facility] and/or Residential Mobile Crisis Unit telephone number  Request made of family/significant other to: Remove weapons (e.g., guns, rifles, knives), all items previously/currently identified as safety concern.   Remove drugs/medications (over-the-counter, prescriptions, illicit drugs), all items previously/currently identified as a safety concern.  [name] [phone] (Mother)verbalizes understanding of the suicide prevention education information provided.  The family member/significant other agrees to remove the items of safety concern listed above.  [name] [name] 07/10/2023, 3:45 PM

## 2023-07-10 NOTE — Inpatient Diabetes Management (Addendum)
Inpatient Diabetes Program Recommendations  AACE/ADA: New Consensus Statement on Inpatient Glycemic Control (2015)  Target Ranges:  Prepandial:   less than 140 mg/dL      Peak postprandial:   less than 180 mg/dL (1-2 hours)      Critically ill patients:  140 - 180 mg/dL   Lab Results  Component Value Date   GLUCAP 450 (H) 07/10/2023   HGBA1C 10.8 (H) 07/08/2023    Review of Glycemic Control  Latest Reference Range & Units 07/10/23 08:43 07/10/23 11:30  Glucose-Capillary 70 - 99 mg/dL 161 (H) 096 (H)  (H): Data is abnormally high  Diabetes history: T1DM  Outpatient Diabetes medications:  Insulin pump-Medtronic 770G with the Guardian CGM Endocrinologist-Dr. Sharl Ma Basal is 27.7 units per day [place]-1.05/hr 6am-1.2/hr 6pm-1.15/hr Insulin carb ratio-1 unit for every 6 carbs Insulin sensitivity factor-1 unit drops her 45 pts Target-100 mg/dL  Current orders for Inpatient glycemic control:  Semglee 10 units BID, Novolog 0-9 units TID and 0-5 units at bedtime, Novolog 4 units TID with meals     Inpatient Diabetes Program Recommendations:    Semglee 18 units BID Novolog 6 units TID with meals    Per Erie Noe, RN patient ate eggs with grits this morning and that is all.  Noon CBG is 450 mg/dL.  MD ordered Novolog 13 units x 1.  I have called Andie, RN with Dr. Daune Perch office to obtain insulin pump settings.  Patient reports to [place] she cannot remember her basal for a 24 hr period but she thinks it's 36 units/day.  Will update recommendations once I hear back from Andie with pump settings.   Addendum at 13:26:  Dr. Sharl Ma returned my phone call.  He provided me with the above insulin pump settings.  Her last visit was May 2nd and her last pump download was May 29th. She canceled her appointment in August.  Dr. Sharl Ma says she is only wearing her CGM 3% of the time.  He also states he feels her insulin sensitivity of 45 mg/dL is generous; likely needs more insulin.     Will continue to  follow while inpatient.  Thank you, Dulce Sellar, MSN, CDCES Diabetes Coordinator Inpatient Diabetes Program [phone] (team pager from 8a-5p)

## 2023-07-10 NOTE — Progress Notes (Signed)
   07/10/23 1644  Psych Admission Type (Psych Patients Only)  Admission Status Voluntary/72 hour document signed  Date 72 hour document signed  07/10/23  Time 72 hour document signed  1644  Provider Notified (First and Last Name) (see details for LINK to note) [place], MD   Patient signed a 72-hour request for discharge on 07/10/23 at 1644.

## 2023-07-10 NOTE — Group Note (Signed)
LCSW Group Therapy Note   Group Date: 07/10/2023 Start Time: 1100 End Time: 1200   Type of Therapy and Topic:  Group Therapy - Coping Skills For Anxiety and Depression  Participation Level:  Did Not Attend   Description of Group The focus of this group was to determine what healthy coping techniques would be helpful for group members in coping with anxiety and depression in their daily lives. The group began with patients introducing themselves and revealing one healthy and one unhealthy way they have coped with anxiety or depression in the past. Patients were guided through different techniques for coping with anxiety in a healthy way, including deep breathing, progressive muscle relaxation, challenging irrational thoughts, and mental imagery. Patients were then guided though different techniques for coping with depression in a healthy way, including behavioral activation, increasing social supports, focusing on positive experiences, and mindfulness. Differences between healthy and unhealthy coping techniques were pointed out when brought up by group members. Patients were asked to identify 2-3 healthy coping skills they would like to learn to use more effectively after being guided through these different techniques.These were explained, samples demonstrated, and resources shared for how to learn more at discharge.  Therapeutic Goals Patients learned that coping is what human beings do to deal with various situations in their lives. Patients learned various healthy coping techniques for anxiety and depression Patients determined 2-3 healthy coping skills they would like to become more familiar with and use more often. Patients provided support and ideas to each other.   Summary of Patient Progress:   Did not attend group   Therapeutic Modalities Cognitive Behavioral Therapy Motivational Interviewing Dialectical Behavioral Therapy  Izell Kendall, LCSW 07/10/2023  1:22 PM

## 2023-07-11 ENCOUNTER — Encounter (HOSPITAL_COMMUNITY): Payer: Self-pay

## 2023-07-11 DIAGNOSIS — F314 Bipolar disorder, current episode depressed, severe, without psychotic features: Secondary | ICD-10-CM | POA: Diagnosis not present

## 2023-07-11 LAB — GLUCOSE, CAPILLARY
Glucose-Capillary: 248 mg/dL — ABNORMAL HIGH (ref 70–99)
Glucose-Capillary: 196 mg/dL — ABNORMAL HIGH (ref 70–99)
Glucose-Capillary: 420 mg/dL — ABNORMAL HIGH (ref 70–99)
Glucose-Capillary: 359 mg/dL — ABNORMAL HIGH (ref 70–99)
Glucose-Capillary: 133 mg/dL — ABNORMAL HIGH (ref 70–99)
Glucose-Capillary: 113 mg/dL — ABNORMAL HIGH (ref 70–99)

## 2023-07-11 MED ORDER — INSULIN GLARGINE-YFGN 100 UNIT/ML ~~LOC~~ SOLN
20.0000 [IU] | SUBCUTANEOUS | Status: AC
  Administered 2023-07-11: 20 [IU] via SUBCUTANEOUS

## 2023-07-11 MED ORDER — INSULIN ASPART 100 UNIT/ML IJ SOLN
10.0000 [IU] | Freq: Once | INTRAMUSCULAR | Status: AC
  Administered 2023-07-11: 10 [IU] via SUBCUTANEOUS

## 2023-07-11 MED ORDER — INSULIN GLARGINE-YFGN 100 UNIT/ML ~~LOC~~ SOLN: 20.0000 [IU] | Freq: Two times a day (BID) | SUBCUTANEOUS | Status: DC

## 2023-07-11 MED ORDER — INSULIN ASPART 100 UNIT/ML IJ SOLN: 0.0000 [IU] | Freq: Every day | INTRAMUSCULAR | Status: DC

## 2023-07-11 MED ORDER — INSULIN ASPART 100 UNIT/ML IJ SOLN
0.0000 [IU] | Freq: Three times a day (TID) | INTRAMUSCULAR | Status: DC
  Administered 2023-07-11: 2 [IU] via SUBCUTANEOUS
  Administered 2023-07-12: 8 [IU] via SUBCUTANEOUS
  Administered 2023-07-12 – 2023-07-13 (×2): 5 [IU] via SUBCUTANEOUS

## 2023-07-11 MED ORDER — INSULIN ASPART 100 UNIT/ML IJ SOLN
10.0000 [IU] | Freq: Three times a day (TID) | INTRAMUSCULAR | Status: DC
  Administered 2023-07-11 – 2023-07-13 (×5): 10 [IU] via SUBCUTANEOUS

## 2023-07-11 NOTE — Progress Notes (Signed)
Pt husband called and was concerned that pt did not receive a dose of insulin earlier and requesting that she get a dose now, he was informed that the pt refused the insulin earlier. He wanted to know why we were not checking her blood sugar more frequently, he was informed we have orders and protocols in place , but our top concern is her safety. Per pt nurse NP(Roy) ordered Symglee 20 units now .

## 2023-07-11 NOTE — Inpatient Diabetes Management (Signed)
Inpatient Diabetes Program Recommendations  AACE/ADA: New Consensus Statement on Inpatient Glycemic Control (2015)  Target Ranges:  Prepandial:   less than 140 mg/dL      Peak postprandial:   less than 180 mg/dL (1-2 hours)      Critically ill patients:  140 - 180 mg/dL   Lab Results  Component Value Date   GLUCAP 420 (H) 07/11/2023   HGBA1C 10.8 (H) 07/08/2023    Review of Glycemic Control  Latest Reference Range & Units 07/11/23 09:08 07/11/23 12:11  Glucose-Capillary 70 - 99 mg/dL 782 (H) 956 (H)  (H): Data is abnormally high  Diabetes history: T1DM   Outpatient Diabetes medications:  Insulin pump-Medtronic 770G with the Guardian CGM Endocrinologist-Dr. Sharl Ma Basal is 27.7 units per day MN-1.05/hr 6am-1.2/hr 6pm-1.15/hr Insulin carb ratio-1 unit for every 6 carbs Insulin sensitivity factor-1 unit drops her 45 pts Target-100 mg/dL   Current orders for Inpatient glycemic control:  Semglee 18 units BID, Novolog 0-9 units TID and 0-5 units at bedtime, Novolog 4 units TID with meals   Inpatient Diabetes Program Recommendations:    Please consider:  Semglee 20 units BID  Novolog 10 units TID with meals if she consumes at least 50%  Will continue to follow while inpatient.  Thank you, Dulce Sellar, MSN, CDCES Diabetes Coordinator Inpatient Diabetes Program [phone] (team pager from 8a-5p)

## 2023-07-11 NOTE — Progress Notes (Signed)
[facility] MD Progress Note  07/11/2023 3:05 PM [name]  MRN:  [id number]  Subjective:    Patient is a 37 year old female with a past psychiatric history of bipolar disorder, was admitted to the [facility] from [facility] after an argument with her husband and a suicide attempt where she took six 0.5 mg clonazepam tablets.   Yesterday the psychiatry team made the following recommendations: -Decrease Vrayler from 4.5 mg to 3 mg daily at bedtime -Decrease Sertraline from 100 mg to 50 mg daily  -Hold Adderall   On assessment today, the pt reports that their mood is less depressed since yesterday, adjusting to milieu. Reports she has been in contact with family/husband and this is going well, they are planning a trip for this weekend and she is excited about this.  Reports that anxiety is less but remains at moderate level.  Sleep is some better. Appetite is better.  Concentration is better.  Energy level is improved.  Denies having any active or passive suicidal thoughts. Denies having any suicidal intent and plan.  Denies having any HI.  Denies having psychotic symptoms.   Denies having side effects to current psychiatric medications.   We discussed changes to current medication regimen, including -none, monitoring medication changes that were made yesterday. We are adjusting her insulin regimen.   Discussed the following psychosocial stressors: conflict with family and oldest son.       Principal Problem: Bipolar disorder, current episode depressed, severe, without psychotic features (HCC) Diagnosis: Principal Problem:   Bipolar disorder, current episode depressed, severe, without psychotic features (HCC) Active Problems:   Generalized anxiety disorder  Total Time spent with patient: 20 minutes  Past Psychiatric History:  Past psychiatric diagnoses include bipolar disorder, depression, anxiety, and ADD. She reports a history of three suicide  attempts: a prescription pill overdose, an insulin overdose, and an attempt to jump out of a window. She reports she was here at the [facility] in 2015 following a suicide attempt before her bipolar diagnosis and medication. She is followed by Dr. Jennelle Human at [facility], with an upcoming appointment in November. She received outpatient counseling through [facility] with Trula Ore and wishes to resume sessions. She reports a "manic episode" four months ago with hyperverbal speech, racing thoughts, impulsiveness, and lack of sleep.      Past Medical History:  Past Medical History:  Diagnosis Date   Anemia    Anemia of chronic disease 06/07/2012   Formatting of this note might be different from the original. Hematology consultation 07-13-16 (see note)--> Anemia of chronic disease Required 3 transfusions during pregnancy  Last Assessment & Plan:  Formatting of this note might be different from the original. She has now required 3 blood transfusions this pregnancy for her anemia of chronic disease. The most recent was one week ago when she bec   Anxiety    Asthma    Asthma 11/10/2013   Back pain    Bipolar 1 disorder (HCC)    Chest pain    Chronic renal failure syndrome, stage 3 (moderate) (HCC)    Colles' fracture of left radius 10/11/2013   Complication of anesthesia    Constipation    Depression    Depression    Diabetic gastroparesis (HCC)    Diabetic neuropathy, type I diabetes mellitus (HCC)    Diabetic ulcer of right great toe (HCC) 08/27/2020   Edema, lower extremity    Gallbladder disease    Gastroparesis  GERD (gastroesophageal reflux disease)    Headache(784.0)    Hypertension    Joint pain    Kidney stones    Palpitations    Polyneuropathy in diabetes(357.2)    PONV (postoperative nausea and vomiting)    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)    S/P carpal tunnel release left 10/16/19 11/04/2019   Shortness of breath    Tachycardia    baseline  tachycardia    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 [facility])     Past Surgical History:  Procedure Laterality Date   ANAL RECTAL MANOMETRY N/A 03/25/2018   Procedure: ANO RECTAL MANOMETRY;  Surgeon: Sherrilyn Rist, MD;  Location: [facility];  Service: Gastroenterology;  Laterality: N/A;   CARPAL TUNNEL RELEASE Left 10/16/2019   Procedure: LEFT CARPAL TUNNEL RELEASE;  Surgeon: Vickki Hearing, MD;  Location: [facility];  Service: Orthopedics;  Laterality: Left;   CESAREAN SECTION     x 2   CHOLECYSTECTOMY     EYE SURGERY     REFRACTIVE SURGERY Bilateral    Family History:  Family History  Problem Relation Age of Onset   Diabetes Mother        type 2   Hypertension Mother    Obesity Mother    Diabetes Father    Hypertension Father    High Cholesterol Father    Sleep apnea Father    Obesity Father    Heart disease Father    Heart attack Father    Diabetes Brother        type 1   Renal Disease Brother        renal failure   Hypertension Brother    Hypertension Maternal Grandmother    Breast cancer Maternal Aunt 64   Colon cancer Maternal Aunt    Uterine cancer Maternal Aunt 53   Breast cancer Other    Rectal cancer Neg Hx    Esophageal cancer Neg Hx    Family Psychiatric  History: See H&P   Social History:  Social History   Substance and Sexual Activity  Alcohol Use No     Social History   Substance and Sexual Activity  Drug Use No    Social History   Socioeconomic History   Marital status: Married    Spouse name: Not on file   Number of children: 2   Years of education: 12   Highest education level: 12th grade  Occupational History   Occupation: Arts development officer  Tobacco Use   Smoking status: Never   Smokeless tobacco: Never  Vaping Use   Vaping status: Never Used  Substance and Sexual Activity   Alcohol use: No   Drug use: No   Sexual activity: Yes    Partners: Male    Birth control/protection: I.U.D.    Comment:  Mirena  Other Topics Concern   Not on file  Social History Narrative   Pt has a daughter and boyfriend.    Dad passed away suddenly heart attack 12/2019   Social Determinants of Health   Financial Resource Strain: Medium Risk (12/07/2022)   Overall Financial Resource Strain (CARDIA)    Difficulty of Paying Living Expenses: Somewhat hard  Food Insecurity: Food Insecurity Present (07/09/2023)   Hunger Vital Sign    Worried About Running Out of Food in the Last Year: Sometimes true    Ran Out of Food in the Last Year: Sometimes true  Transportation Needs: No Transportation Needs (07/09/2023)  PRAPARE - Administrator, Civil Service (Medical): No    Lack of Transportation (Non-Medical): No  Physical Activity: Insufficiently Active (12/07/2022)   Exercise Vital Sign    Days of Exercise per Week: 2 days    Minutes of Exercise per Session: 30 min  Stress: No Stress Concern Present (12/07/2022)   [employer] of Occupational Health - Occupational Stress Questionnaire    Feeling of Stress : Only a little  Social Connections: Moderately Integrated (12/07/2022)   Social Connection and Isolation Panel [NHANES]    Frequency of Communication with Friends and Family: More than three times a week    Frequency of Social Gatherings with Friends and Family: Once a week    Attends Religious Services: More than 4 times per year    Active Member of [employer] or Organizations: No    Attends Engineer, structural: Never    Marital Status: Married   Additional Social History:                           Current Medications: Current Facility-Administered Medications  Medication Dose Route Frequency Provider Last Rate Last Admin   acetaminophen (TYLENOL) tablet 650 mg  650 mg Oral Q6H PRN Dahlia Byes C, NP   650 mg at 07/11/23 1404   alum & mag hydroxide-simeth (MAALOX/MYLANTA) 200-200-20 MG/5ML suspension 30 mL  30 mL Oral Q4H PRN Dahlia Byes C, NP       aspirin  chewable tablet 81 mg  81 mg Oral QHS Bennett, Christal H, NP   81 mg at 07/10/23 2115   cariprazine (VRAYLAR) capsule 3 mg  3 mg Oral Daily Bennett, Christal H, NP   3 mg at 07/10/23 2100   diphenhydrAMINE (BENADRYL) capsule 50 mg  50 mg Oral TID PRN Earney Navy, NP       Or   diphenhydrAMINE (BENADRYL) injection 50 mg  50 mg Intramuscular TID PRN Dahlia Byes C, NP       haloperidol (HALDOL) tablet 5 mg  5 mg Oral TID PRN Dahlia Byes C, NP       Or   haloperidol lactate (HALDOL) injection 5 mg  5 mg Intramuscular TID PRN Dahlia Byes C, NP       insulin aspart (novoLOG) injection 0-15 Units  0-15 Units Subcutaneous TID WC Maryon Kemnitz, Harrold Donath, MD       insulin aspart (novoLOG) injection 0-5 Units  0-5 Units Subcutaneous QHS Brae Gartman, Harrold Donath, MD   3 Units at 07/10/23 2120   insulin aspart (novoLOG) injection 10 Units  10 Units Subcutaneous TID WC Ellinor Test, Harrold Donath, MD       insulin glargine-yfgn (SEMGLEE) injection 20 Units  20 Units Subcutaneous BID Velita Quirk, Harrold Donath, MD       LORazepam (ATIVAN) tablet 2 mg  2 mg Oral TID PRN Earney Navy, NP       Or   LORazepam (ATIVAN) injection 2 mg  2 mg Intramuscular TID PRN Dahlia Byes C, NP       losartan (COZAAR) tablet 25 mg  25 mg Oral Daily Onuoha, Josephine C, NP   25 mg at 07/11/23 0900   magnesium hydroxide (MILK OF MAGNESIA) suspension 30 mL  30 mL Oral Daily PRN Dahlia Byes C, NP       montelukast (SINGULAIR) tablet 10 mg  10 mg Oral QHS Bennett, Christal H, NP   10 mg at 07/10/23 2100   ondansetron (ZOFRAN-ODT) disintegrating tablet 4  mg  4 mg Oral Q8H PRN Dahlia Byes C, NP       pravastatin (PRAVACHOL) tablet 20 mg  20 mg Oral QHS Bennett, Christal H, NP   20 mg at 07/10/23 2100   sertraline (ZOLOFT) tablet 50 mg  50 mg Oral Daily Bennett, Christal H, NP   50 mg at 07/11/23 0902   Ubrogepant TABS 100 mg  100 mg Oral Daily PRN Earney Navy, NP        Lab Results:  Results for  orders placed or performed during the hospital encounter of 07/09/23 (from the past 48 hour(s))  Glucose, capillary     Status: Abnormal   Collection Time: 07/09/23  5:00 PM  Result Value Ref Range   Glucose-Capillary 320 (H) 70 - 99 mg/dL    Comment: Glucose reference range applies only to samples taken after fasting for at least 8 hours.   Comment 1 Notify RN    Comment 2 Document in Chart   Glucose, capillary     Status: Abnormal   Collection Time: 07/09/23  9:31 PM  Result Value Ref Range   Glucose-Capillary 302 (H) 70 - 99 mg/dL    Comment: Glucose reference range applies only to samples taken after fasting for at least 8 hours.   Comment 1 Notify RN   Glucose, capillary     Status: None   Collection Time: 07/10/23  6:24 AM  Result Value Ref Range   Glucose-Capillary 95 70 - 99 mg/dL    Comment: Glucose reference range applies only to samples taken after fasting for at least 8 hours.  Glucose, capillary     Status: Abnormal   Collection Time: 07/10/23  8:43 AM  Result Value Ref Range   Glucose-Capillary 416 (H) 70 - 99 mg/dL    Comment: Glucose reference range applies only to samples taken after fasting for at least 8 hours.  Glucose, capillary     Status: Abnormal   Collection Time: 07/10/23 11:30 AM  Result Value Ref Range   Glucose-Capillary 450 (H) 70 - 99 mg/dL    Comment: Glucose reference range applies only to samples taken after fasting for at least 8 hours.  Glucose, capillary     Status: Abnormal   Collection Time: 07/10/23  4:47 PM  Result Value Ref Range   Glucose-Capillary 218 (H) 70 - 99 mg/dL    Comment: Glucose reference range applies only to samples taken after fasting for at least 8 hours.  Glucose, capillary     Status: Abnormal   Collection Time: 07/10/23  8:42 PM  Result Value Ref Range   Glucose-Capillary 291 (H) 70 - 99 mg/dL    Comment: Glucose reference range applies only to samples taken after fasting for at least 8 hours.   Comment 1 Notify RN     Comment 2 Document in Chart   Glucose, capillary     Status: Abnormal   Collection Time: 07/11/23  6:04 AM  Result Value Ref Range   Glucose-Capillary 248 (H) 70 - 99 mg/dL    Comment: Glucose reference range applies only to samples taken after fasting for at least 8 hours.   Comment 1 Notify RN    Comment 2 Document in Chart   Glucose, capillary     Status: Abnormal   Collection Time: 07/11/23  9:08 AM  Result Value Ref Range   Glucose-Capillary 196 (H) 70 - 99 mg/dL    Comment: Glucose reference range applies only to samples taken after  fasting for at least 8 hours.  Glucose, capillary     Status: Abnormal   Collection Time: 07/11/23 12:11 PM  Result Value Ref Range   Glucose-Capillary 420 (H) 70 - 99 mg/dL    Comment: Glucose reference range applies only to samples taken after fasting for at least 8 hours.  Glucose, capillary     Status: Abnormal   Collection Time: 07/11/23  1:56 PM  Result Value Ref Range   Glucose-Capillary 359 (H) 70 - 99 mg/dL    Comment: Glucose reference range applies only to samples taken after fasting for at least 8 hours.    Blood Alcohol level:  Lab Results  Component Value Date   [facility] <10 07/08/2023   ETH <11 05/28/2014    Metabolic Disorder Labs: Lab Results  Component Value Date   HGBA1C 10.8 (H) 07/08/2023   MPG 263.26 07/08/2023   MPG 220 11/02/2022   No results found for: "PROLACTIN" Lab Results  Component Value Date   CHOL 127 11/02/2022   TRIG 56 11/02/2022   HDL 66 11/02/2022   CHOLHDL 1.9 11/02/2022   VLDL 10 04/22/2014   LDLCALC 47 11/02/2022   LDLCALC 91 04/04/2021    Physical Findings: AIMS:  , ,  ,  ,    CIWA:    COWS:     Musculoskeletal: Strength & Muscle Tone: within normal limits Gait & Station: normal Patient leans: N/A  Psychiatric Specialty Exam:  Presentation  General Appearance:  Casual; Fairly Groomed; Appropriate for Environment  Eye Contact: Fair  Speech: Normal Rate  Speech  Volume: Normal  Handedness: Right   Mood and Affect  Mood: Anxious; Dysphoric  Affect: Appropriate; Congruent; Full Range   Thought Process  Thought Processes: Linear  Descriptions of Associations:Intact  Orientation:Full (Time, Place and Person)  Thought Content:Logical  History of Schizophrenia/Schizoaffective disorder:No data recorded Duration of Psychotic Symptoms:No data recorded Hallucinations:Hallucinations: None  Ideas of Reference:None  Suicidal Thoughts:Suicidal Thoughts: No  Homicidal Thoughts:Homicidal Thoughts: No   Sensorium  Memory: Immediate Good; Recent Good; Remote Good  Judgment: Fair  Insight: Fair   Art therapist  Concentration: Fair  Attention Span: Fair  Recall: Good  Fund of Knowledge: Good  Language: Good   Psychomotor Activity  Psychomotor Activity: Psychomotor Activity: Normal   Assets  Assets: Desire for Improvement; Housing; Resilience; Social Support   Sleep  Sleep: Sleep: Fair    Physical Exam: Physical Exam Vitals reviewed.  Pulmonary:     Effort: Pulmonary effort is normal.  Neurological:     Mental Status: She is alert.     Motor: No weakness.     Gait: Gait normal.  Psychiatric:        Behavior: Behavior normal.        Thought Content: Thought content normal.        Judgment: Judgment normal.    Review of Systems  Constitutional:  Negative for chills and fever.  Cardiovascular:  Negative for chest pain and palpitations.  Neurological:  Negative for dizziness, tingling, tremors and headaches.  Psychiatric/Behavioral:  Positive for depression. Negative for hallucinations, memory loss, substance abuse and suicidal ideas. The patient is nervous/anxious. The patient does not have insomnia.   All other systems reviewed and are negative.  Blood pressure 113/75, pulse 92, temperature 98.5 F (36.9 C), temperature source Oral, resp. rate 12, height 5\' 6"  (1.676 m), weight 79.6 kg,  SpO2 100%. Body mass index is 28.31 kg/m.   Treatment Plan Summary: ASSESSMENT:   Diagnoses /  Active Problems:  Bipolar disorder, current episode depressed, severe, without psychotic features   Generalized anxiety disorder   PLAN: Safety and Monitoring:             -- Voluntary admission to inpatient psychiatric unit for safety, stabilization and treatment             -- Daily contact with patient to assess and evaluate symptoms and progress in treatment             -- Patient's case to be discussed in multi-disciplinary team meeting             -- Observation Level : q15 minute checks             -- Vital signs:  q12 hours             -- Precautions: suicide, elopement, and assault   2. Psychiatric Diagnoses and Treatment:              -Continue Vraylar 3 mg daily at bedtime for bipolar depression  -Continue Sertraline 50 mg daily for bipolar depression  -Hold Adderall      BH Agitation PRNs -Benadryl 50mg  oral or IM 3 times daily PRN agitation -Haldol 5mg  oral or IM 3 times daily PRN agitation -Ativan 2mg  oral or Im 3 times daily PRN agitation   --  The risks/benefits/side-effects/alternatives to this medication were discussed in detail with the patient and time was given for questions. The patient consents to medication trial.              -- Metabolic profile and EKG monitoring obtained while on an atypical antipsychotic (BMI: Lipid Panel: HbgA1c: QTc:)              -- Encouraged patient to participate in unit milieu and in scheduled group therapies              -- Short Term Goals: Ability to verbalize feelings will improve, Ability to disclose and discuss suicidal ideas, Ability to demonstrate self-control will improve, and Ability to identify and develop effective coping behaviors will improve             -- Long Term Goals: Improvement in symptoms so as ready for discharge                3. Medical Issues Being Addressed:                           DM Type 1              -Diabetes Coordinator consulted              -Semglee 20 unit BID             -Novolog 10 Units TID with meals             -CBGs AC/HS                                       CKD             -Restart home medication      PRNs -Tylenol 650mg  oral every 6 hours PRN mild pain -Maalox/Mylanta 30mL oral every 4 hours PRN indigestion -Milk of Magnesia 30mL oral daily PRN mild constipation -Zofran-ODT 4 mg oral every 8 hours PRN, nausea, vomiting -Ubbrogepant 100 mg, daily PRN  for migraine headaches                   4. Discharge Planning:              -- Social work and case management to assist with discharge planning and identification of hospital follow-up needs prior to discharge             -- Estimated LOS: 3-4 more days             -- Discharge Concerns: Need to establish a safety plan; Medication compliance and effectiveness             -- Discharge Goals: Return home with outpatient referrals for mental health follow-up including medication management/psychotherapy       Cristy Hilts, MD 07/11/2023, 3:05 PM  Total Time Spent in Direct Patient Care:  I personally spent 35 minutes on the unit in direct patient care. The direct patient care time included face-to-face time with the patient, reviewing the patient's chart, communicating with other professionals, and coordinating care. Greater than 50% of this time was spent in counseling or coordinating care with the patient regarding goals of hospitalization, psycho-education, and discharge planning needs.   Phineas Inches, MD Psychiatrist

## 2023-07-11 NOTE — H&P (Addendum)
Psychiatric Admission Assessment Adult  Patient Identification: [name] MRN:  [id number] Date of Evaluation:  07/11/2023 Chief Complaint:  Major depressive disorder, recurrent severe without psychotic features (HCC) [F33.2] Principal Diagnosis: Bipolar disorder, current episode depressed, severe, without psychotic features (HCC) Diagnosis:  Principal Problem:   Bipolar disorder, current episode depressed, severe, without psychotic features (HCC) Active Problems:   Generalized anxiety disorder  History of Present Illness:  Patient is a 37 year old female with a past psychiatric history of bipolar disorder, was admitted to the [facility] from [facility] after an argument with her husband and a suicide attempt where she took six 0.5 mg clonazepam tablets.   Outpatient psychiatric medications include Vraylar, Adderall, sertraline, and PRN clonazepam.  On assessment today, she reports increased stress since her 64 year old stepson moved in this past summer to attend college online. She reports feeling overwhelmed, stressed, and has had poor energy for the past two weeks. She reports feeling depressed for the past two to three weeks and has low energy. She reports compliance with her medication regimen. She reports that she takes clonazepam as needed for anxiety, one or two per week. She reports her concentration is good with Adderall. She reports taking Vraylar at night, which helps her sleep. Regarding her appetite, she reports no unintentional weight loss and attends [employer]. Her depression is rated as 3/10, and anxiety as 4/10, with 10 being the worst. She denies active suicidal or homicidal ideation, auditory or visual hallucinations, paranoia, or delusional thoughts. She reports her mood has improved since admission, and she has been attending and engaging in group sessions.    Past psychiatric diagnoses include bipolar  disorder, depression, anxiety, and ADD. She reports a history of three suicide attempts: a prescription pill overdose, an insulin overdose, and an attempt to jump out of a window. She reports she was here at the [facility] in 2015 following a suicide attempt before her bipolar diagnosis and medication. She is followed by Dr. Jennelle Human at [facility], with an upcoming appointment in November. She received outpatient counseling through [facility] with Trula Ore and wishes to resume sessions. She reports a "manic episode" four months ago with hyperverbal speech, racing thoughts, impulsiveness, and lack of sleep.   Past medical history includes type 1 DM, asthma, gastroparesis, and chronic kidney disease stage 3. She is blind in her right eye and has had eight eye surgeries due to a detached retina secondary to type 1 diabetes. Home medications include pravastatin, aspirin 81 mg, Singulair 10 mg. She denies seizure history or head trauma.   She denies illicit substance or alcohol use, smoking or tobacco use.   Family history includes dementia in her maternal grandmother and ADHD in her two daughters.    Her support system includes her mother and husband. She receives disability benefits for physical and mental health conditions.  She has two daughters in the home. She has three stepchildren, one of whom moved in this past summer to attend college.   The patient's blood sugar has been significantly elevated during her stay, and she uses an insulin pump, which unfortunately is not allowed in the [facility]. The Diabetes Coordinator was consulted and management will continue under the guidance of the diabetes coordinator   Medication Adjustments:   - Decrease Vraylar from 4 mg to 3 mg at bedtime as discussed.   - Decrease Sertraline from 100 mg to 50 mg daily as discussed  Is the patient  at risk to self? No.  Has the patient been a risk to self in the past 6  months? Yes.    Has the patient been a risk to self within the distant past? Yes.    Is the patient a risk to others? No.  Has the patient been a risk to others in the past 6 months? No.  Has the patient been a risk to others within the distant past? No.   [place] Scale:  Flowsheet Row Ad[facility] ([facility]) [facility] from 07/08/2023 in [facility] at [facility] Counselor from 09/20/2022 in [facility] at [facility] RISK CATEGORY Low Risk No Risk No Risk         Alcohol Screening: 1. How often do you have a drink containing alcohol?: Never 2. How many drinks containing alcohol do you have on a typical day when you are drinking?: 1 or 2 3. How often do you have six or more drinks on one occasion?: Never AUDIT-C Score: 0 4. How often during the last year have you found that you were not able to stop drinking once you had started?: Never 5. How often during the last year have you failed to do what was normally expected from you because of drinking?: Never 6. How often during the last year have you needed a first drink in the morning to get yourself going after a heavy drinking session?: Never 7. How often during the last year have you had a feeling of guilt of remorse after drinking?: Never 8. How often during the last year have you been unable to remember what happened the night before because you had been drinking?: Never 9. Have you or someone else been injured as a result of your drinking?: No 10. Has a relative or friend or a doctor or another health worker been concerned about your drinking or suggested you cut down?: No Alcohol Use Disorder Identification Test Final Score (AUDIT): 0 Alcohol Brief Interventions/Follow-up:  (n/a) Substance Abuse History in the last 12 months:  No. Consequences of Substance Abuse: Negative Previous Psychotropic Medications: Yes   Psychological Evaluations: Yes  Past Medical History:  Past Medical History:  Diagnosis Date   Anemia    Anemia of chronic disease 06/07/2012   Formatting of this note might be different from the original. Hematology consultation 07-13-16 (see note)--> Anemia of chronic disease Required 3 transfusions during pregnancy  Last Assessment & Plan:  Formatting of this note might be different from the original. She has now required 3 blood transfusions this pregnancy for her anemia of chronic disease. The most recent was one week ago when she bec   Anxiety    Asthma    Asthma 11/10/2013   Back pain    Bipolar 1 disorder (HCC)    Chest pain    Chronic renal failure syndrome, stage 3 (moderate) (HCC)    Colles' fracture of left radius 10/11/2013   Complication of anesthesia    Constipation    Depression    Depression    Diabetic gastroparesis (HCC)    Diabetic neuropathy, type I diabetes mellitus (HCC)    Diabetic ulcer of right great toe (HCC) 08/27/2020   Edema, lower extremity    Gallbladder disease    Gastroparesis    GERD (gastroesophageal reflux disease)    Headache(784.0)    Hypertension    Joint pain    Kidney stones  Palpitations    Polyneuropathy in diabetes(357.2)    PONV (postoperative nausea and vomiting)    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)    S/P carpal tunnel release left 10/16/19 11/04/2019   Shortness of breath    Tachycardia    baseline tachycardia    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 [facility])     Past Surgical History:  Procedure Laterality Date   ANAL RECTAL MANOMETRY N/A 03/25/2018   Procedure: ANO RECTAL MANOMETRY;  Surgeon: Sherrilyn Rist, MD;  Location: [facility];  Service: Gastroenterology;  Laterality: N/A;   CARPAL TUNNEL RELEASE Left 10/16/2019   Procedure: LEFT CARPAL TUNNEL RELEASE;  Surgeon: Vickki Hearing, MD;  Location: [facility];  Service: Orthopedics;  Laterality: Left;   CESAREAN SECTION     x 2    CHOLECYSTECTOMY     EYE SURGERY     REFRACTIVE SURGERY Bilateral    Family History:  Family History  Problem Relation Age of Onset   Diabetes Mother        type 2   Hypertension Mother    Obesity Mother    Diabetes Father    Hypertension Father    High Cholesterol Father    Sleep apnea Father    Obesity Father    Heart disease Father    Heart attack Father    Diabetes Brother        type 1   Renal Disease Brother        renal failure   Hypertension Brother    Hypertension Maternal Grandmother    Breast cancer Maternal Aunt 64   Colon cancer Maternal Aunt    Uterine cancer Maternal Aunt 41   Breast cancer Other    Rectal cancer Neg Hx    Esophageal cancer Neg Hx     Tobacco Screening:  Social History   Tobacco Use  Smoking Status Never  Smokeless Tobacco Never    BH Tobacco Counseling     Are you interested in Tobacco Cessation Medications?  N/A, patient does not use tobacco products Counseled patient on smoking cessation:  N/A, patient does not use tobacco products Reason Tobacco Screening Not Completed: No value filed.       Social History:  Social History   Substance and Sexual Activity  Alcohol Use No     Social History   Substance and Sexual Activity  Drug Use No    Additional Social History: Are you sexually active?: Yes What is your sexual orientation?: Straight Has your sexual activity been affected by drugs, alcohol, medication, or emotional stress?: Emotional stress Does patient have children?: Yes How many children?: 2 How is patient's relationship with their children?: good relationships with children--ages 3 and 8                         Allergies:   Allergies  Allergen Reactions   Nsaids Other (See Comments)    Due to Renal disease   Other    Toradol [Ketorolac Tromethamine]     Cannot take d/t kidney issues    Ceftin Rash   Lab Results:  Results for orders placed or performed during the hospital encounter of  07/09/23 (from the past 48 hour(s))  Glucose, capillary     Status: Abnormal   Collection Time: 07/09/23  5:00 PM  Result Value Ref Range   Glucose-Capillary 320 (H) 70 - 99 mg/dL    Comment: Glucose reference  range applies only to samples taken after fasting for at least 8 hours.   Comment 1 Notify RN    Comment 2 Document in Chart   Glucose, capillary     Status: Abnormal   Collection Time: 07/09/23  9:31 PM  Result Value Ref Range   Glucose-Capillary 302 (H) 70 - 99 mg/dL    Comment: Glucose reference range applies only to samples taken after fasting for at least 8 hours.   Comment 1 Notify RN   Glucose, capillary     Status: None   Collection Time: 07/10/23  6:24 AM  Result Value Ref Range   Glucose-Capillary 95 70 - 99 mg/dL    Comment: Glucose reference range applies only to samples taken after fasting for at least 8 hours.  Glucose, capillary     Status: Abnormal   Collection Time: 07/10/23  8:43 AM  Result Value Ref Range   Glucose-Capillary 416 (H) 70 - 99 mg/dL    Comment: Glucose reference range applies only to samples taken after fasting for at least 8 hours.  Glucose, capillary     Status: Abnormal   Collection Time: 07/10/23 11:30 AM  Result Value Ref Range   Glucose-Capillary 450 (H) 70 - 99 mg/dL    Comment: Glucose reference range applies only to samples taken after fasting for at least 8 hours.  Glucose, capillary     Status: Abnormal   Collection Time: 07/10/23  4:47 PM  Result Value Ref Range   Glucose-Capillary 218 (H) 70 - 99 mg/dL    Comment: Glucose reference range applies only to samples taken after fasting for at least 8 hours.  Glucose, capillary     Status: Abnormal   Collection Time: 07/10/23  8:42 PM  Result Value Ref Range   Glucose-Capillary 291 (H) 70 - 99 mg/dL    Comment: Glucose reference range applies only to samples taken after fasting for at least 8 hours.   Comment 1 Notify RN    Comment 2 Document in Chart     Blood Alcohol level:   Lab Results  Component Value Date   ETH <10 07/08/2023   ETH <11 05/28/2014    Metabolic Disorder Labs:  Lab Results  Component Value Date   HGBA1C 10.8 (H) 07/08/2023   MPG 263.26 07/08/2023   MPG 220 11/02/2022   No results found for: "PROLACTIN" Lab Results  Component Value Date   CHOL 127 11/02/2022   TRIG 56 11/02/2022   HDL 66 11/02/2022   CHOLHDL 1.9 11/02/2022   VLDL 10 04/22/2014   LDLCALC 47 11/02/2022   LDLCALC 91 04/04/2021    Current Medications: Current Facility-Administered Medications  Medication Dose Route Frequency Provider Last Rate Last Admin   acetaminophen (TYLENOL) tablet 650 mg  650 mg Oral Q6H PRN Dahlia Byes C, NP   650 mg at 07/10/23 1505   alum & mag hydroxide-simeth (MAALOX/MYLANTA) 200-200-20 MG/5ML suspension 30 mL  30 mL Oral Q4H PRN Dahlia Byes C, NP       aspirin chewable tablet 81 mg  81 mg Oral QHS Khameron Gruenwald H, NP   81 mg at 07/10/23 2115   cariprazine (VRAYLAR) capsule 3 mg  3 mg Oral Daily Talissa Apple H, NP   3 mg at 07/10/23 2100   diphenhydrAMINE (BENADRYL) capsule 50 mg  50 mg Oral TID PRN Dahlia Byes C, NP       Or   diphenhydrAMINE (BENADRYL) injection 50 mg  50 mg Intramuscular TID PRN  Earney Navy, NP       haloperidol (HALDOL) tablet 5 mg  5 mg Oral TID PRN Dahlia Byes C, NP       Or   haloperidol lactate (HALDOL) injection 5 mg  5 mg Intramuscular TID PRN Dahlia Byes C, NP       insulin aspart (novoLOG) injection 0-5 Units  0-5 Units Subcutaneous QHS Massengill, Harrold Donath, MD   3 Units at 07/10/23 2120   insulin aspart (novoLOG) injection 0-9 Units  0-9 Units Subcutaneous TID WC Massengill, Harrold Donath, MD   3 Units at 07/10/23 1713   insulin aspart (novoLOG) injection 6 Units  6 Units Subcutaneous TID WC Massengill, Harrold Donath, MD   6 Units at 07/10/23 1714   insulin glargine-yfgn (SEMGLEE) injection 18 Units  18 Units Subcutaneous BID Phineas Inches, MD   18 Units at 07/10/23 1714    LORazepam (ATIVAN) tablet 2 mg  2 mg Oral TID PRN Earney Navy, NP       Or   LORazepam (ATIVAN) injection 2 mg  2 mg Intramuscular TID PRN Earney Navy, NP       losartan (COZAAR) tablet 25 mg  25 mg Oral Daily Dahlia Byes C, NP   25 mg at 07/10/23 0759   magnesium hydroxide (MILK OF MAGNESIA) suspension 30 mL  30 mL Oral Daily PRN Earney Navy, NP       montelukast (SINGULAIR) tablet 10 mg  10 mg Oral QHS Ferron Ishmael H, NP   10 mg at 07/10/23 2100   ondansetron (ZOFRAN-ODT) disintegrating tablet 4 mg  4 mg Oral Q8H PRN Dahlia Byes C, NP       pravastatin (PRAVACHOL) tablet 20 mg  20 mg Oral QHS Nettie Cromwell H, NP   20 mg at 07/10/23 2100   sertraline (ZOLOFT) tablet 50 mg  50 mg Oral Daily Lemoyne Nestor H, NP       Ubrogepant TABS 100 mg  100 mg Oral Daily PRN Earney Navy, NP       PTA Medications: Medications Prior to Admission  Medication Sig Dispense Refill Last Dose   albuterol (PROVENTIL) (2.5 MG/3ML) 0.083% nebulizer solution Take 3 mLs (2.5 mg total) by nebulization every 6 (six) hours as needed for wheezing or shortness of breath. 150 mL 1    albuterol (VENTOLIN HFA) 108 (90 Base) MCG/ACT inhaler TAKE 2 PUFFS BY MOUTH EVERY 6 HOURS AS NEEDED FOR WHEEZE OR SHORTNESS OF BREATH 18 each 2    amphetamine-dextroamphetamine (ADDERALL) 20 MG tablet Take 20 mg by mouth daily.      Budeson-Glycopyrrol-Formoterol (BREZTRI AEROSPHERE) 160-9-4.8 MCG/ACT AERO Inhale 2 puffs into the lungs 2 (two) times daily.      Cariprazine HCl (VRAYLAR) 4.5 MG CAPS Take 1 capsule (4.5 mg total) by mouth daily. 30 capsule 3    carvedilol (COREG) 6.25 MG tablet Take 6.25 mg by mouth daily.      clonazePAM (KLONOPIN) 0.5 MG tablet Take 1 tablet (0.5 mg total) by mouth every 8 (eight) hours as needed. for anxiety 20 tablet 0    FASENRA 30 MG/ML SOSY Inject into the skin every 3 (three) months.      furosemide (LASIX) 40 MG tablet Take 40 mg by mouth daily as  needed for fluid.      GLUCAGON EMERGENCY 1 MG injection Inject 1 mg into the skin as needed (hypoglycemia.).      glucose blood (ACCU-CHEK GUIDE) test strip Use to check blood sugar 3 time  a day 300 strip 4    insulin lispro (HUMALOG) 100 UNIT/ML injection Inject up to 150 units daily according to insulin pump settings. 10 mL 0    levonorgestrel (MIRENA, 52 MG,) 20 MCG/24HR IUD 1 each by Intrauterine route once.      losartan (COZAAR) 25 MG tablet Take 25 mg by mouth daily.      montelukast (SINGULAIR) 10 MG tablet Take 1 tablet (10 mg total) by mouth at bedtime. 90 tablet 1    sertraline (ZOLOFT) 100 MG tablet Take 1 tablet (100 mg total) by mouth daily. 90 tablet 0    tirzepatide (MOUNJARO) 5 MG/0.5ML Pen Inject 5 mg into the skin once a week. (Patient taking differently: Inject 7.5 mg into the skin once a week.) 6 mL 1    Ubrogepant (UBRELVY) 100 MG TABS Take 1 tablet (100 mg total) by mouth daily as needed (migraine headaches.). 30 tablet 3     Musculoskeletal: Strength & Muscle Tone: within normal limits Gait & Station: normal Patient leans: N/A            Psychiatric Specialty Exam:  Presentation  General Appearance:  Casual  Eye Contact: Good  Speech: Clear and Coherent  Speech Volume: Normal  Handedness: Right   Mood and Affect  Mood: Depressed; Anxious  Affect: Congruent; Depressed   Thought Process  Thought Processes: Coherent  Duration of Psychotic Symptoms:N/A Past Diagnosis of Schizophrenia or Psychoactive disorder: No Descriptions of Associations:Intact  Orientation:Full (Time, Place and Person)  Thought Content:Logical; WDL  Hallucinations:Hallucinations: None  Ideas of Reference:None  Suicidal Thoughts:Suicidal Thoughts: No  Homicidal Thoughts:Homicidal Thoughts: No   Sensorium  Memory: Immediate Good; Recent Good  Judgment: Poor  Insight: Fair   Art therapist  Concentration: Fair  Attention  Span: Fair  Recall: Fair  Fund of Knowledge: Good  Language: Fair   Psychomotor Activity  Psychomotor Activity: Psychomotor Activity: Increased   Assets  Assets: Desire for Improvement; Housing; Resilience; Social Support   Sleep  Sleep: Sleep: Good    Physical Exam: Physical Exam Vitals and nursing note reviewed.  Constitutional:      General: She is not in acute distress. HENT:     Head: Normocephalic.     Nose: Nose normal.  Pulmonary:     Effort: Pulmonary effort is normal. No respiratory distress.  Musculoskeletal:        General: Normal range of motion.     Cervical back: Normal range of motion.  Neurological:     General: No focal deficit present.     Mental Status: She is alert and oriented to person, place, and time.    Review of Systems  Constitutional:  Negative for fever, malaise/fatigue and weight loss.  HENT:  Negative for hearing loss.   Eyes:        Reports impaired vision in Right eye  Respiratory:  Negative for shortness of breath.   Cardiovascular:  Negative for chest pain.  Gastrointestinal:  Negative for constipation, diarrhea, nausea and vomiting.  Neurological:  Negative for tremors.  Psychiatric/Behavioral:  Positive for depression and suicidal ideas. The patient is nervous/anxious.   All other systems reviewed and are negative.  Blood pressure 133/87, pulse 93, temperature 99.6 F (37.6 C), temperature source Oral, resp. rate 12, height 5\' 6"  (1.676 m), weight 79.6 kg, SpO2 98%. Body mass index is 28.31 kg/m.  Treatment Plan Summary: ASSESSMENT:  Diagnoses / Active Problems:  Bipolar disorder, current episode depressed, severe, without psychotic features   Generalized  anxiety disorder  PLAN: Safety and Monitoring:  -- Voluntary admission to inpatient psychiatric unit for safety, stabilization and treatment  -- Daily contact with patient to assess and evaluate symptoms and progress in treatment  -- Patient's case to be  discussed in multi-disciplinary team meeting  -- Observation Level : q15 minute checks  -- Vital signs:  q12 hours  -- Precautions: suicide, elopement, and assault  2. Psychiatric Diagnoses and Treatment:   -Decrease Vrayler from 4.5 mg to 3 mg daily at bedtime -Decrease Sertraline from 100 mg to 50 mg daily  -Hold Adderall    BH Agitation PRNs -Benadryl 50mg  oral or IM 3 times daily PRN agitation -Haldol 5mg  oral or IM 3 times daily PRN agitation -Ativan 2mg  oral or Im 3 times daily PRN agitation  --  The risks/benefits/side-effects/alternatives to this medication were discussed in detail with the patient and time was given for questions. The patient consents to medication trial.   -- Metabolic profile and EKG monitoring obtained while on an atypical antipsychotic (BMI: Lipid Panel: HbgA1c: QTc:)   -- Encouraged patient to participate in unit milieu and in scheduled group therapies   -- Short Term Goals: Ability to verbalize feelings will improve, Ability to disclose and discuss suicidal ideas, Ability to demonstrate self-control will improve, and Ability to identify and develop effective coping behaviors will improve  -- Long Term Goals: Improvement in symptoms so as ready for discharge    3. Medical Issues Being Addressed:     DM Type 1  -Diabetes Coordinator consulted   -Semglee 8 unit BID  -Novolog 6 units TID with meals  -CBGs AC/HS      CKD  -Restart home medication     PRNs -Tylenol 650mg  oral every 6 hours PRN mild pain -Maalox/Mylanta 30mL oral every 4 hours PRN indigestion -Milk of Magnesia 30mL oral daily PRN mild constipation -Zofran-ODT 4 mg oral every 8 hours PRN, nausea, vomiting -Ubbrogepant 100 mg, daily PRN for migraine headaches       4. Discharge Planning:   -- Social work and case management to assist with discharge planning and identification of hospital follow-up needs prior to discharge  -- Estimated LOS: 5-7 days  -- Discharge Concerns: Need to  establish a safety plan; Medication compliance and effectiveness  -- Discharge Goals: Return home with outpatient referrals for mental health follow-up including medication management/psychotherapy    I certify that inpatient services furnished can reasonably be expected to improve the patient's condition.    Norma Fredrickson, NP 10/2/20246:06 AM   Total Time Spent in Direct Patient Care:  I personally spent 65 minutes on the unit in direct patient care. The direct patient care time included face-to-face time with the patient, reviewing the patient's chart, communicating with other professionals, and coordinating care. Greater than 50% of this time was spent in counseling or coordinating care with the patient regarding goals of hospitalization, psycho-education, and discharge planning needs.

## 2023-07-11 NOTE — Progress Notes (Signed)
Was notify by RN that patient,  had refused their 1700 insulin glargine-yfgn (SEMGLEE) injection 20 Units , pt now wants to take her insuline.  New order place to give patient her Glargine 20 Units insulin

## 2023-07-11 NOTE — BHH Suicide Risk Assessment (Signed)
Suicide Risk Assessment  Admission Assessment    [facility] Admission Suicide Risk Assessment   Nursing information obtained from:  Patient Demographic factors:  Low socioeconomic status, Unemployed, Caucasian Current Mental Status:  Suicidal ideation indicated by patient Loss Factors:  Financial problems / change in socioeconomic status Historical Factors:  Prior suicide attempts Risk Reduction Factors:  Positive social support, Sense of responsibility to family, Responsible for children under 31 years of age  Total Time spent with patient: 30 minutes Principal Problem: Bipolar disorder, current episode depressed, severe, without psychotic features (HCC) Diagnosis:  Principal Problem:   Bipolar disorder, current episode depressed, severe, without psychotic features (HCC) Active Problems:   Generalized anxiety disorder  Subjective Data:  Patient is a 37 year old female with a past psychiatric history of bipolar disorder, was admitted to the [facility] from [facility] after an argument with her husband and a suicide attempt where she took six 0.5 mg clonazepam tablets.     Outpatient psychiatric medications include Vraylar, Adderall, sertraline, and PRN clonazepam.   On assessment today, she reports increased stress since her 32 year old stepson moved in this past summer to attend college online. She reports feeling overwhelmed, stressed, and has had poor energy for the past two weeks. She reports feeling depressed for the past two to three weeks and has low energy. She reports compliance with her medication regimen. She reports that she takes clonazepam as needed for anxiety, one or two per week. She reports her concentration is good with Adderall. She reports taking Vraylar at night, which helps her sleep. Regarding her appetite, she reports no unintentional weight loss and attends [employer]. Her depression is rated as 3/10, and anxiety as 4/10,  with 10 being the worst. She denies active suicidal or homicidal ideation, auditory or visual hallucinations, paranoia, or delusional thoughts. She reports her mood has improved since admission, and she has been attending and engaging in group sessions.      Past psychiatric diagnoses include bipolar disorder, depression, anxiety, and ADD. She reports a history of three suicide attempts: a prescription pill overdose, an insulin overdose, and an attempt to jump out of a window. She reports she was here at the [facility] in 2015 following a suicide attempt before her bipolar diagnosis and medication. She is followed by Dr. Jennelle Human at [facility], with an upcoming appointment in November. She received outpatient counseling through [facility] with Trula Ore and wishes to resume sessions. She reports a "manic episode" four months ago with hyperverbal speech, racing thoughts, impulsiveness, and lack of sleep.    Past medical history includes type 1 DM, asthma, gastroparesis, and chronic kidney disease stage 3. She is blind in her right eye and has had eight eye surgeries due to a detached retina secondary to type 1 diabetes. Home medications include pravastatin, aspirin 81 mg, Singulair 10 mg. She denies seizure history or head trauma.     She denies illicit substance or alcohol use, smoking or tobacco use.     Family history includes dementia in her maternal grandmother and ADHD in her two daughters.      Her support system includes her mother and husband. She receives disability benefits for physical and mental health conditions.  She has two daughters in the home. She has three stepchildren, one of whom moved in this past summer to attend college.    Continued Clinical Symptoms:  Alcohol Use Disorder Identification Test Final Score (AUDIT): 0 The "Alcohol Use Disorders  Identification Test", Guidelines for Use in Primary Care, Second Edition.  World Science writer  [facility]). Score between 0-7:  no or low risk or alcohol related problems. Score between 8-15:  moderate risk of alcohol related problems. Score between 16-19:  high risk of alcohol related problems. Score 20 or above:  warrants further diagnostic evaluation for alcohol dependence and treatment.   CLINICAL FACTORS:   Severe Anxiety and/or Agitation Bipolar Disorder:   Depressive phase Depression:   Impulsivity Previous Psychiatric Diagnoses and Treatments Medical Diagnoses and Treatments/Surgeries     Psychiatric Specialty Exam:  Presentation  General Appearance:  Casual  Eye Contact: Good  Speech: Clear and Coherent  Speech Volume: Normal  Handedness: Right   Mood and Affect  Mood: Depressed; Anxious  Affect: Congruent; Depressed   Thought Process  Thought Processes: Coherent  Descriptions of Associations:Intact  Orientation:Full (Time, Place and Person)  Thought Content:Logical; WDL  History of Schizophrenia/Schizoaffective disorder:No data recorded Duration of Psychotic Symptoms:No data recorded Hallucinations:Hallucinations: None  Ideas of Reference:None  Suicidal Thoughts:Suicidal Thoughts: No  Homicidal Thoughts:Homicidal Thoughts: No   Sensorium  Memory: Immediate Good; Recent Good  Judgment: Poor  Insight: Fair   Art therapist  Concentration: Fair  Attention Span: Fair  Recall: Fair  Fund of Knowledge:Good  Language: Fair   Psychomotor Activity  Psychomotor Activity:Psychomotor Activity: Increased   Assets  Assets: Desire for Improvement; Housing; Resilience; Social Support   Sleep  Sleep:Sleep: Good    Physical Exam: Physical Exam See H&P ROS See H&P Blood pressure 133/87, pulse 93, temperature 99.6 F (37.6 C), temperature source Oral, resp. rate 12, height 5\' 6"  (1.676 m), weight 79.6 kg, SpO2 98%. Body mass index is 28.31 kg/m.   COGNITIVE FEATURES THAT CONTRIBUTE TO RISK:  None     SUICIDE RISK:   Moderate:  Frequent suicidal ideation with limited intensity, and duration, some specificity in terms of plans, no associated intent, good self-control, limited dysphoria/symptomatology, some risk factors present, and identifiable protective factors, including available and accessible social support.  PLAN OF CARE: See H&P  I certify that inpatient services furnished can reasonably be expected to improve the patient's condition.   Norma Fredrickson, NP 07/11/2023, 6:05 AM

## 2023-07-11 NOTE — Progress Notes (Signed)
   07/10/23 2000  Psych Admission Type (Psych Patients Only)  Admission Status Voluntary  Date 72 hour document signed  07/10/23  Time 72 hour document signed  1644  Provider Notified (First and Last Name) (see details for LINK to note) Massengil  Psychosocial Assessment  Patient Complaints Anxiety;Depression  Eye Contact Fair  Facial Expression Sad  Affect Depressed  Speech Logical/coherent  Interaction Assertive  Motor Activity Slow  Appearance/Hygiene Unremarkable  Behavior Characteristics Cooperative;Appropriate to situation  Mood Depressed;Anxious  Thought Process  Coherency WDL  Content WDL  Delusions WDL  Perception WDL  Hallucination None reported or observed  Judgment Impaired  Confusion None  Danger to Self  Current suicidal ideation? Denies (Denies)  Agreement Not to Harm Self Yes  Description of Agreement verbal  Danger to Others  Danger to Others None reported or observed

## 2023-07-11 NOTE — BHH Group Notes (Signed)
Spiritual care group facilitated by Chaplain Dyanne Carrel, [facility]  Group focused on topic of strength. Group members reflected on what thoughts and feelings emerge when they hear this topic. They then engaged in facilitated dialog around how strength is present in their lives. This dialog focused on representing what strength had been to them in their lives (images and patterns given) and what they saw as helpful in their life now (what they needed / wanted).  Activity drew on narrative framework.  Patient Progress: [name] attended group and actively engaged and participated in group conversation and activities.

## 2023-07-11 NOTE — BH IP Treatment Plan (Signed)
Interdisciplinary Treatment and Diagnostic Plan Initial  07/11/2023 Time of Session: 1045 [name] MRN: [id number]  Principal Diagnosis: Bipolar disorder, current episode depressed, severe, without psychotic features (HCC)  Secondary Diagnoses: Principal Problem:   Bipolar disorder, current episode depressed, severe, without psychotic features (HCC) Active Problems:   Generalized anxiety disorder   Current Medications:  Current Facility-Administered Medications  Medication Dose Route Frequency Provider Last Rate Last Admin   acetaminophen (TYLENOL) tablet 650 mg  650 mg Oral Q6H PRN Dahlia Byes C, NP   650 mg at 07/10/23 1505   alum & mag hydroxide-simeth (MAALOX/MYLANTA) 200-200-20 MG/5ML suspension 30 mL  30 mL Oral Q4H PRN Earney Navy, NP       aspirin chewable tablet 81 mg  81 mg Oral QHS Bennett, Christal H, NP   81 mg at 07/10/23 2115   cariprazine (VRAYLAR) capsule 3 mg  3 mg Oral Daily Bennett, Christal H, NP   3 mg at 07/10/23 2100   diphenhydrAMINE (BENADRYL) capsule 50 mg  50 mg Oral TID PRN Earney Navy, NP       Or   diphenhydrAMINE (BENADRYL) injection 50 mg  50 mg Intramuscular TID PRN Dahlia Byes C, NP       haloperidol (HALDOL) tablet 5 mg  5 mg Oral TID PRN Dahlia Byes C, NP       Or   haloperidol lactate (HALDOL) injection 5 mg  5 mg Intramuscular TID PRN Dahlia Byes C, NP       insulin aspart (novoLOG) injection 0-5 Units  0-5 Units Subcutaneous QHS Massengill, Harrold Donath, MD   3 Units at 07/10/23 2120   insulin aspart (novoLOG) injection 0-9 Units  0-9 Units Subcutaneous TID WC Massengill, Harrold Donath, MD   3 Units at 07/11/23 1324   insulin aspart (novoLOG) injection 6 Units  6 Units Subcutaneous TID WC Massengill, Harrold Donath, MD   6 Units at 07/11/23 1219   insulin glargine-yfgn (SEMGLEE) injection 18 Units  18 Units Subcutaneous BID Phineas Inches, MD   18 Units at 07/11/23 0914   LORazepam (ATIVAN) tablet 2 mg  2 mg  Oral TID PRN Earney Navy, NP       Or   LORazepam (ATIVAN) injection 2 mg  2 mg Intramuscular TID PRN Earney Navy, NP       losartan (COZAAR) tablet 25 mg  25 mg Oral Daily Onuoha, Josephine C, NP   25 mg at 07/11/23 0900   magnesium hydroxide (MILK OF MAGNESIA) suspension 30 mL  30 mL Oral Daily PRN Dahlia Byes C, NP       montelukast (SINGULAIR) tablet 10 mg  10 mg Oral QHS Bennett, Christal H, NP   10 mg at 07/10/23 2100   ondansetron (ZOFRAN-ODT) disintegrating tablet 4 mg  4 mg Oral Q8H PRN Dahlia Byes C, NP       pravastatin (PRAVACHOL) tablet 20 mg  20 mg Oral QHS Bennett, Christal H, NP   20 mg at 07/10/23 2100   sertraline (ZOLOFT) tablet 50 mg  50 mg Oral Daily Bennett, Christal H, NP   50 mg at 07/11/23 0902   Ubrogepant TABS 100 mg  100 mg Oral Daily PRN Earney Navy, NP       PTA Medications: Medications Prior to Admission  Medication Sig Dispense Refill Last Dose   albuterol (PROVENTIL) (2.5 MG/3ML) 0.083% nebulizer solution Take 3 mLs (2.5 mg total) by nebulization every 6 (six) hours as needed for wheezing or shortness of  breath. 150 mL 1    albuterol (VENTOLIN HFA) 108 (90 Base) MCG/ACT inhaler TAKE 2 PUFFS BY MOUTH EVERY 6 HOURS AS NEEDED FOR WHEEZE OR SHORTNESS OF BREATH 18 each 2    amphetamine-dextroamphetamine (ADDERALL) 20 MG tablet Take 20 mg by mouth daily.      Budeson-Glycopyrrol-Formoterol (BREZTRI AEROSPHERE) 160-9-4.8 MCG/ACT AERO Inhale 2 puffs into the lungs 2 (two) times daily.      Cariprazine HCl (VRAYLAR) 4.5 MG CAPS Take 1 capsule (4.5 mg total) by mouth daily. 30 capsule 3    carvedilol (COREG) 6.25 MG tablet Take 6.25 mg by mouth daily.      clonazePAM (KLONOPIN) 0.5 MG tablet Take 1 tablet (0.5 mg total) by mouth every 8 (eight) hours as needed. for anxiety 20 tablet 0    FASENRA 30 MG/ML SOSY Inject into the skin every 3 (three) months.      furosemide (LASIX) 40 MG tablet Take 40 mg by mouth daily as needed for fluid.       GLUCAGON EMERGENCY 1 MG injection Inject 1 mg into the skin as needed (hypoglycemia.).      glucose blood (ACCU-CHEK GUIDE) test strip Use to check blood sugar 3 time a day 300 strip 4    insulin lispro (HUMALOG) 100 UNIT/ML injection Inject up to 150 units daily according to insulin pump settings. 10 mL 0    levonorgestrel (MIRENA, 52 MG,) 20 MCG/24HR IUD 1 each by Intrauterine route once.      losartan (COZAAR) 25 MG tablet Take 25 mg by mouth daily.      montelukast (SINGULAIR) 10 MG tablet Take 1 tablet (10 mg total) by mouth at bedtime. 90 tablet 1    sertraline (ZOLOFT) 100 MG tablet Take 1 tablet (100 mg total) by mouth daily. 90 tablet 0    tirzepatide (MOUNJARO) 5 MG/0.5ML Pen Inject 5 mg into the skin once a week. (Patient taking differently: Inject 7.5 mg into the skin once a week.) 6 mL 1    Ubrogepant (UBRELVY) 100 MG TABS Take 1 tablet (100 mg total) by mouth daily as needed (migraine headaches.). 30 tablet 3     Patient Stressors: Financial difficulties   Marital or family conflict    Patient Strengths: Supportive family/friends   Treatment Modalities: Medication Management, Group therapy, Case management,  1 to 1 session with clinician, Psychoeducation, Recreational therapy.   Physician Treatment Plan for Primary Diagnosis: Bipolar disorder, current episode depressed, severe, without psychotic features (HCC) Long Term Goal(s): Improvement in symptoms so as ready for discharge   Short Term Goals: Ability to verbalize feelings will improve Ability to disclose and discuss suicidal ideas Ability to demonstrate self-control will improve Ability to identify and develop effective coping behaviors will improve  Medication Management: Evaluate patient's response, side effects, and tolerance of medication regimen.  Therapeutic Interventions: 1 to 1 sessions, Unit Group sessions and Medication administration.  Evaluation of Outcomes: Progressing  Physician Treatment Plan  for Secondary Diagnosis: Principal Problem:   Bipolar disorder, current episode depressed, severe, without psychotic features (HCC) Active Problems:   Generalized anxiety disorder  Long Term Goal(s): Improvement in symptoms so as ready for discharge   Short Term Goals: Ability to verbalize feelings will improve Ability to disclose and discuss suicidal ideas Ability to demonstrate self-control will improve Ability to identify and develop effective coping behaviors will improve     Medication Management: Evaluate patient's response, side effects, and tolerance of medication regimen.  Therapeutic Interventions: 1 to 1 sessions,  Unit Group sessions and Medication administration.  Evaluation of Outcomes: Progressing   RN Treatment Plan for Primary Diagnosis: Bipolar disorder, current episode depressed, severe, without psychotic features (HCC) Long Term Goal(s): Knowledge of disease and therapeutic regimen to maintain health will improve  Short Term Goals: Ability to remain free from injury will improve, Ability to verbalize frustration and anger appropriately will improve, Ability to demonstrate self-control, Ability to participate in decision making will improve, Ability to verbalize feelings will improve, Ability to disclose and discuss suicidal ideas, Ability to identify and develop effective coping behaviors will improve, and Compliance with prescribed medications will improve  Medication Management: RN will administer medications as ordered by provider, will assess and evaluate patient's response and provide education to patient for prescribed medication. RN will report any adverse and/or side effects to prescribing provider.  Therapeutic Interventions: 1 on 1 counseling sessions, Psychoeducation, Medication administration, Evaluate responses to treatment, Monitor vital signs and CBGs as ordered, Perform/monitor CIWA, COWS, AIMS and Fall Risk screenings as ordered, Perform wound care  treatments as ordered.  Evaluation of Outcomes: Progressing   LCSW Treatment Plan for Primary Diagnosis: Bipolar disorder, current episode depressed, severe, without psychotic features (HCC) Long Term Goal(s): Safe transition to appropriate next level of care at discharge, Engage patient in therapeutic group addressing interpersonal concerns.  Short Term Goals: Engage patient in aftercare planning with referrals and resources, Increase social support, Increase ability to appropriately verbalize feelings, Increase emotional regulation, Facilitate acceptance of mental health diagnosis and concerns, Facilitate patient progression through stages of change regarding substance use diagnoses and concerns, Identify triggers associated with mental health/substance abuse issues, and Increase skills for wellness and recovery  Therapeutic Interventions: Assess for all discharge needs, 1 to 1 time with Social worker, Explore available resources and support systems, Assess for adequacy in community support network, Educate family and significant other(s) on suicide prevention, Complete Psychosocial Assessment, Interpersonal group therapy.  Evaluation of Outcomes: Progressing   Progress in Treatment: Attending groups: Yes. Participating in groups: Yes. Taking medication as prescribed: Yes. Toleration medication: Yes. Family/Significant other contact made: Yes, individual(s) contacted:  -Cedric Fishman [phone] (Mother) Patient understands diagnosis: Yes. Discussing patient identified problems/goals with staff: Yes. Medical problems stabilized or resolved: Yes. Denies suicidal/homicidal ideation: Yes. Issues/concerns per patient self-inventory: Yes. Other: N/A  New problem(s) identified: No, Describe:  None Reported  New Short Term/Long Term Goal(s):   medication stabilization, elimination of SI thoughts, development of comprehensive mental wellness plan.   Patient Goals:  Coping  Skills  Discharge Plan or Barriers:  Patient recently admitted. CSW will continue to follow and assess for appropriate referrals and possible discharge planning.     Reason for Continuation of Hospitalization: Anxiety Depression Medication stabilization Suicidal ideation  Estimated Length of Stay: 3-7 Days  Last 3 [place] Suicide Severity Risk Score: Flowsheet Row [facility] ([facility] from 07/08/2023 in [facility] Counselor from 09/20/2022 in [facility] at [facility] RISK CATEGORY Low Risk No Risk No Risk       Last PHQ 2/9 Scores:    12/07/2022   12:38 PM 11/02/2022    9:23 AM 09/20/2022    1:16 PM  Depression screen PHQ 2/9  Decreased Interest 0 0 0  Down, Depressed, Hopeless 0 0 0  PHQ - 2 Score 0 0 0  Altered sleeping 0    Tired, decreased energy 1    Change  in appetite 2    Feeling bad or failure about yourself  1    Trouble concentrating 1    Moving slowly or fidgety/restless 0    Suicidal thoughts 0    PHQ-9 Score 5    Difficult doing work/chores Not difficult at all        medication stabilization, elimination of SI thoughts, development of comprehensive mental wellness plan.   Scribe for Treatment Team: Ane Payment, LCSW 07/11/2023 1:28 PM

## 2023-07-11 NOTE — Progress Notes (Signed)
   07/11/23 0914  Psych Admission Type (Psych Patients Only)  Admission Status Voluntary  Date 72 hour document signed  07/10/23  Time 72 hour document signed  1644  Provider Notified (First and Last Name) (see details for LINK to note) Massengill  Psychosocial Assessment  Patient Complaints Depression  Eye Contact Fair  Facial Expression Animated  Affect Appropriate to circumstance  Speech Logical/coherent  Interaction Assertive  Motor Activity Other (Comment) (WDL)  Appearance/Hygiene Unremarkable  Behavior Characteristics Cooperative  Mood Pleasant  Thought Process  Coherency WDL  Content WDL  Delusions WDL  Perception WDL  Hallucination None reported or observed  Judgment Poor  Confusion None  Danger to Self  Current suicidal ideation? Denies  Agreement Not to Harm Self Yes  Description of Agreement verbal  Danger to Others  Danger to Others None reported or observed

## 2023-07-11 NOTE — Plan of Care (Signed)
?  Problem: Education: ?Goal: Mental status will improve ?Outcome: Progressing ?Goal: Verbalization of understanding the information provided will improve ?Outcome: Progressing ?  ?

## 2023-07-11 NOTE — Group Note (Signed)
Date:  07/11/2023 Time:  12:12 PM  Group Topic/Focus:  Goals Group:   The focus of this group is to help patients establish daily goals to achieve during treatment and discuss how the patient can incorporate goal setting into their daily lives to aide in recovery.    Participation Level:  Active  Participation Quality:  Attentive  Affect:  Appropriate  Cognitive:  Alert  Insight: Appropriate and Good  Engagement in Group:  Engaged  Modes of Intervention:  Discussion  Additional Comments:    Beckie Busing 07/11/2023, 12:12 PM

## 2023-07-11 NOTE — Group Note (Signed)
Recreation Therapy Group Note   Group Topic:Other  Group Date: 07/11/2023 Start Time: 1405 End Time: 1450 Facilitators: Isao Seltzer-McCall, LRT,CTRS Location: 300 Hall Dayroom   Activity Description/Intervention: Therapeutic Drumming. Patients with peers and staff were given the opportunity to engage in a leader facilitated [employer] Group Empowerment Drumming Circle with staff from the [employer], in partnership with [employer]. Teaching laboratory technician and trained [employer], Theodoro Doing leading with LRT observing and documenting intervention and pt response. This evidenced-based practice targets 7 areas of health and wellbeing in the human experience including: stress-reduction, exercise, self-expression, camaraderie/support, nurturing, spirituality, and music-making (leisure).   Goal Area(s) Addresses:  Patient will engage in pro-social way in music group.  Patient will follow directions of drum leader on the first prompt. Patient will demonstrate no behavioral issues during group.  Patient will identify if a reduction in stress level occurs as a result of participation in therapeutic drum circle.    Education: Leisure exposure, Coping skills, Musical expression, Discharge Planning  [name] actively engaged in therapeutic drumming exercise and discussions. Pt was appropriate with peers, staff, and musical equipment for duration of programming.  Pt identified "mellow" as their feeling after participation in music-based programming. Pt affect congruent/incongruent with verbalized emotion.    Affect/Mood: Appropriate   Participation Level: Engaged   Participation Quality: Independent   Behavior: Appropriate   Speech/Thought Process: Focused   Insight: Good   Judgement: Good   Modes of Intervention: Teaching laboratory technician   Patient Response to Interventions:  Engaged   Education Outcome:  In group clarification offered    Clinical  Observations/Individualized Feedback:     Plan: Continue to engage patient in RT group sessions 2-3x/week.   [name], LRT,CTRS 07/11/2023 3:22 PM

## 2023-07-12 DIAGNOSIS — F314 Bipolar disorder, current episode depressed, severe, without psychotic features: Secondary | ICD-10-CM | POA: Diagnosis not present

## 2023-07-12 LAB — GLUCOSE, CAPILLARY
Glucose-Capillary: 272 mg/dL — ABNORMAL HIGH (ref 70–99)
Glucose-Capillary: 194 mg/dL — ABNORMAL HIGH (ref 70–99)
Glucose-Capillary: 225 mg/dL — ABNORMAL HIGH (ref 70–99)
Glucose-Capillary: 111 mg/dL — ABNORMAL HIGH (ref 70–99)
Glucose-Capillary: 120 mg/dL — ABNORMAL HIGH (ref 70–99)
Glucose-Capillary: 129 mg/dL — ABNORMAL HIGH (ref 70–99)

## 2023-07-12 MED ORDER — INSULIN GLARGINE-YFGN 100 UNIT/ML ~~LOC~~ SOLN
22.0000 [IU] | Freq: Two times a day (BID) | SUBCUTANEOUS | Status: DC
  Administered 2023-07-12 – 2023-07-13 (×3): 22 [IU] via SUBCUTANEOUS

## 2023-07-12 NOTE — Progress Notes (Addendum)
Per report/documentation from outgoing nurse, patient refused Semglee insulin at 1700 hour. At [facility] while patient was given medications, but did not receive sliding scale insulin as her low blood glucose did not meet ordered parameters. At that time patient requested Semglee that she had refused earlier in the evening. Writer spoke with Channing Mutters NP to request new order for Semglee 20 units. Order obtained. Semglee administered and snack given by Clinical research associate at that time. Patient husband Wilber Oliphant notified with patient present.   07/11/23 2029  Psych Admission Type (Psych Patients Only)  Admission Status Voluntary  Psychosocial Assessment  Patient Complaints Depression  Eye Contact Fair  Facial Expression Animated  Affect Appropriate to circumstance  Speech Logical/coherent  Interaction Assertive  Motor Activity Other (Comment) (WDL)  Appearance/Hygiene Unremarkable  Behavior Characteristics Cooperative  Mood Pleasant  Thought Process  Coherency WDL  Content WDL  Delusions WDL  Perception WDL  Hallucination None reported or observed  Judgment Poor  Confusion None  Danger to Self  Current suicidal ideation? Denies  Agreement Not to Harm Self Yes  Description of Agreement verbal  Danger to Others  Danger to Others None reported or observed

## 2023-07-12 NOTE — Inpatient Diabetes Management (Addendum)
Inpatient Diabetes Program Recommendations  AACE/ADA: New Consensus Statement on Inpatient Glycemic Control (2015)  Target Ranges:  Prepandial:   less than 140 mg/dL      Peak postprandial:   less than 180 mg/dL (1-2 hours)      Critically ill patients:  140 - 180 mg/dL   Lab Results  Component Value Date   GLUCAP 272 (H) 07/12/2023   HGBA1C 10.8 (H) 07/08/2023    Review of Glycemic Control  Latest Reference Range & Units 07/11/23 09:08 07/11/23 12:11 07/11/23 13:56 07/11/23 17:16 07/11/23 20:58 07/12/23 05:52  Glucose-Capillary 70 - 99 mg/dL 478 (H) 295 (H) 621 (H) 133 (H) 113 (H) 272 (H)  (H): Data is abnormally high  Current orders for Inpatient glycemic control:  Semglee 18 units BID Novolog 0-15 units TID and 0-5 units at bedtime Novolog 10 units TID with meals  Inpatient Diabetes Program Recommendations:    Semglee 22 units BID  Patient will need to schedule an appointment with Dr. Sharl Ma at discharge for pump adjustments.    Will continue to follow while inpatient.  Thank you, Dulce Sellar, MSN, CDCES Diabetes Coordinator Inpatient Diabetes Program [phone] (team pager from 8a-5p)

## 2023-07-12 NOTE — [facility] Group Notes (Signed)
[facility] Group Notes:  (Nursing/MHT/Case Management/Adjunct)  Date:  07/12/2023  Time:  9:29 PM  Type of Therapy:  The focus of this group is to help patients establish daily goals to achieve during treatment and discuss how the patient can incorporate goal setting into their daily lives to aide in recovery.   Participation Level:  Active  Participation Quality:  Sharing and Supportive  Affect:  Anxious and Appropriate  Cognitive:  Alert and Appropriate  Insight:  Appropriate, Good, and Improving  Engagement in Group:  Engaged and Supportive  Modes of Intervention:  Socialization and Support  Summary of Progress/Problems: Pt attended group  [name] 07/12/2023, 9:29 PM

## 2023-07-12 NOTE — Progress Notes (Signed)
   07/12/23 0748  Psych Admission Type (Psych Patients Only)  Admission Status Voluntary  Date 72 hour document signed  07/10/23  Time 72 hour document signed  [id number]  Provider Notified (First and Last Name) (see details for LINK to note) Mass[place]ill MD  Psychosocial Assessment  Patient Complaints None  Eye Contact Fair  Facial Expression Animated  Affect Appropriate to circumstance  Speech Logical/coherent  Interaction Assertive  Motor Activity Other (Comment) (WDL)  Appearance/Hygiene Unremarkable  Behavior Characteristics Cooperative  Mood Pleasant  Thought Process  Coherency WDL  Content WDL  Delusions None reported or observed  Perception WDL  Hallucination None reported or observed  Judgment Poor  Confusion None  Danger to Self  Current suicidal ideation? Denies  Agreement Not to Harm Self Yes  Description of Agreement verbal  Danger to Others  Danger to Others None reported or observed

## 2023-07-12 NOTE — Progress Notes (Addendum)
[facility] MD Progress Note  07/12/2023 11:09 AM [name]  MRN:  [id number]  Subjective:    Patient is a 37 year old female with a past psychiatric history of bipolar disorder, was admitted to the [facility] from [facility] after an argument with her husband and a suicide attempt where she took six 0.5 mg clonazepam tablets.     24 hr Chart Review: DBP and HR slightly elevated this morning. Patient is compliant with routine medication regimen without difficulty. Sleep hours last night: 6.75 hours, as documented in the nursing flow sheets. No behavioral episodes or nursing concerns were reported at this time. PRN Tylenol was administered last night for pain, according to the nursing record.    During today's rounds, the patient was observed in the dayroom socializing amongst peers. On today's assessment, she described her mood as "good." She rated both her anxiety and depression as a 2 out of 10, with 10 being the most severe. She reported sleeping "good" last night.  Her appetite remains adequate, and she reports eating 3 meals per day.  Energy is reported as "good.".  The patient reports her concentration as "a little off" due to not taking her Adderall during hospitalization. She denied having any suicidal thoughts, plans, or intent, as well as any homicidal ideation. Additionally, she denies auditory or visual hallucinations, thought insertion, or paranoia. The patient reported attending and actively participating in group sessions. She states that she attended a wrap-up meeting last night that was beneficial. She states during this hospitalization she has learned new coping skills and plans to utilize them upon discharge.    During interdisciplinary team meeting this morning it was revealed the patient allegedly refused her long-acting insulin yesterday evening. When asked about refusing her insulin the previous evening, the patient stated she did not refuse her  insulin, and there must have been miscommunication between the nurses.  She stated due to an elevated blood sugar at supper she received a high dose of insulin, and that she and the attending nurse at that time felt it was best if the long-acting insulin be administered upon recheck of her blood sugar at bedtime.  The patient stated that she has type 1 diabetes and is on an insulin pump at home; If she had not received her long-acting insulin her blood sugars would be extremely elevated throughout the night and into the next morning. The patient is requesting to be discharged today as her daughter is going on a trip tomorrow and she has not seen her all week.  She also reports her family has a trip planned and she would like to prepare for the trip.   Tentative discharge date set for Friday, October 4th, pending the completion of safety planning and follow-up appointments by CSW.    We discussed changes to the current medication regimen: None     The following medication adjustments were made during hospitalization: -Decrease Vrayler from 4.5 mg to 3 mg daily at bedtime -Decrease Sertraline from 100 mg to 50 mg daily  -Hold Adderall      Principal Problem: Bipolar disorder, current episode depressed, severe, without psychotic features (HCC) Diagnosis: Principal Problem:   Bipolar disorder, current episode depressed, severe, without psychotic features (HCC) Active Problems:   Generalized anxiety disorder  Total Time spent with patient: 30 minutes  Past Psychiatric History:  Past psychiatric diagnoses include bipolar disorder, depression, anxiety, and ADD. She reports a history of three suicide attempts: a prescription pill overdose, an  insulin overdose, and an attempt to jump out of a window. She reports she was here at the [facility] in 2015 following a suicide attempt before her bipolar diagnosis and medication. She is followed by Dr. Jennelle Human at [facility], with an upcoming  appointment in November. She received outpatient counseling through [facility] with Trula Ore and wishes to resume sessions. She reports a "manic episode" four months ago with hyperverbal speech, racing thoughts, impulsiveness, and lack of sleep.      Past Medical History:  Past Medical History:  Diagnosis Date   Anemia    Anemia of chronic disease 06/07/2012   Formatting of this note might be different from the original. Hematology consultation 07-13-16 (see note)--> Anemia of chronic disease Required 3 transfusions during pregnancy  Last Assessment & Plan:  Formatting of this note might be different from the original. She has now required 3 blood transfusions this pregnancy for her anemia of chronic disease. The most recent was one week ago when she bec   Anxiety    Asthma    Asthma 11/10/2013   Back pain    Bipolar 1 disorder (HCC)    Chest pain    Chronic renal failure syndrome, stage 3 (moderate) (HCC)    Colles' fracture of left radius 10/11/2013   Complication of anesthesia    Constipation    Depression    Depression    Diabetic gastroparesis (HCC)    Diabetic neuropathy, type I diabetes mellitus (HCC)    Diabetic ulcer of right great toe (HCC) 08/27/2020   Edema, lower extremity    Gallbladder disease    Gastroparesis    GERD (gastroesophageal reflux disease)    Headache(784.0)    Hypertension    Joint pain    Kidney stones    Palpitations    Polyneuropathy in diabetes(357.2)    PONV (postoperative nausea and vomiting)    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)    S/P carpal tunnel release left 10/16/19 11/04/2019   Shortness of breath    Tachycardia    baseline tachycardia    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)     Past Surgical History:  Procedure Laterality Date   ANAL RECTAL MANOMETRY N/A 03/25/2018   Procedure: ANO RECTAL MANOMETRY;  Surgeon: Sherrilyn Rist, MD;  Location: [facility];  Service:  Gastroenterology;  Laterality: N/A;   CARPAL TUNNEL RELEASE Left 10/16/2019   Procedure: LEFT CARPAL TUNNEL RELEASE;  Surgeon: Vickki Hearing, MD;  Location: [facility];  Service: Orthopedics;  Laterality: Left;   CESAREAN SECTION     x 2   CHOLECYSTECTOMY     EYE SURGERY     REFRACTIVE SURGERY Bilateral    Family History:  Family History  Problem Relation Age of Onset   Diabetes Mother        type 2   Hypertension Mother    Obesity Mother    Diabetes Father    Hypertension Father    High Cholesterol Father    Sleep apnea Father    Obesity Father    Heart disease Father    Heart attack Father    Diabetes Brother        type 1   Renal Disease Brother        renal failure   Hypertension Brother    Hypertension Maternal Grandmother    Breast cancer Maternal Aunt 64   Colon cancer Maternal Aunt    Uterine cancer  Maternal Aunt 63   Breast cancer Other    Rectal cancer Neg Hx    Esophageal cancer Neg Hx    Family Psychiatric  History: See H&P   Social History:  Social History   Substance and Sexual Activity  Alcohol Use No     Social History   Substance and Sexual Activity  Drug Use No    Social History   Socioeconomic History   Marital status: Married    Spouse name: Not on file   Number of children: 2   Years of education: 12   Highest education level: 12th grade  Occupational History   Occupation: Arts development officer  Tobacco Use   Smoking status: Never   Smokeless tobacco: Never  Vaping Use   Vaping status: Never Used  Substance and Sexual Activity   Alcohol use: No   Drug use: No   Sexual activity: Yes    Partners: Male    Birth control/protection: I.U.D.    Comment: Mirena  Other Topics Concern   Not on file  Social History Narrative   Pt has a daughter and boyfriend.    Dad passed away suddenly heart attack 12/2019   Social Determinants of Health   Financial Resource Strain: Medium Risk (12/07/2022)   Overall Financial Resource Strain (CARDIA)     Difficulty of Paying Living Expenses: Somewhat hard  Food Insecurity: Food Insecurity Present (07/09/2023)   Hunger Vital Sign    Worried About Running Out of Food in the Last Year: Sometimes true    Ran Out of Food in the Last Year: Sometimes true  Transportation Needs: No Transportation Needs (07/09/2023)   PRAPARE - Administrator, Civil Service (Medical): No    Lack of Transportation (Non-Medical): No  Physical Activity: Insufficiently Active (12/07/2022)   Exercise Vital Sign    Days of Exercise per Week: 2 days    Minutes of Exercise per Session: 30 min  Stress: No Stress Concern Present (12/07/2022)   [employer] of Occupational Health - Occupational Stress Questionnaire    Feeling of Stress : Only a little  Social Connections: Moderately Integrated (12/07/2022)   Social Connection and Isolation Panel [NHANES]    Frequency of Communication with Friends and Family: More than three times a week    Frequency of Social Gatherings with Friends and Family: Once a week    Attends Religious Services: More than 4 times per year    Active Member of [employer] or Organizations: No    Attends Engineer, structural: Never    Marital Status: Married   Additional Social History:                           Current Medications: Current Facility-Administered Medications  Medication Dose Route Frequency Provider Last Rate Last Admin   acetaminophen (TYLENOL) tablet 650 mg  650 mg Oral Q6H PRN Dahlia Byes C, NP   650 mg at 07/11/23 1404   alum & mag hydroxide-simeth (MAALOX/MYLANTA) 200-200-20 MG/5ML suspension 30 mL  30 mL Oral Q4H PRN Dahlia Byes C, NP       aspirin chewable tablet 81 mg  81 mg Oral QHS Makahla Kiser H, NP   81 mg at 07/11/23 2121   cariprazine (VRAYLAR) capsule 3 mg  3 mg Oral Daily Nazyia Gaugh H, NP   3 mg at 07/11/23 2121   diphenhydrAMINE (BENADRYL) capsule 50 mg  50 mg Oral TID PRN Earney Navy,  NP       Or    diphenhydrAMINE (BENADRYL) injection 50 mg  50 mg Intramuscular TID PRN Dahlia Byes C, NP       haloperidol (HALDOL) tablet 5 mg  5 mg Oral TID PRN Dahlia Byes C, NP       Or   haloperidol lactate (HALDOL) injection 5 mg  5 mg Intramuscular TID PRN Dahlia Byes C, NP       insulin aspart (novoLOG) injection 0-15 Units  0-15 Units Subcutaneous TID WC Massengill, Harrold Donath, MD   8 Units at 07/12/23 7829   insulin aspart (novoLOG) injection 0-5 Units  0-5 Units Subcutaneous QHS Phineas Inches, MD   3 Units at 07/10/23 2120   insulin aspart (novoLOG) injection 10 Units  10 Units Subcutaneous TID WC Phineas Inches, MD   10 Units at 07/12/23 0620   insulin glargine-yfgn (SEMGLEE) injection 22 Units  22 Units Subcutaneous Q12H Phineas Inches, MD   22 Units at 07/12/23 1105   LORazepam (ATIVAN) tablet 2 mg  2 mg Oral TID PRN Earney Navy, NP       Or   LORazepam (ATIVAN) injection 2 mg  2 mg Intramuscular TID PRN Earney Navy, NP       losartan (COZAAR) tablet 25 mg  25 mg Oral Daily Dahlia Byes C, NP   25 mg at 07/12/23 0748   magnesium hydroxide (MILK OF MAGNESIA) suspension 30 mL  30 mL Oral Daily PRN Dahlia Byes C, NP       montelukast (SINGULAIR) tablet 10 mg  10 mg Oral QHS Chizara Mena H, NP   10 mg at 07/11/23 2122   ondansetron (ZOFRAN-ODT) disintegrating tablet 4 mg  4 mg Oral Q8H PRN Dahlia Byes C, NP       pravastatin (PRAVACHOL) tablet 20 mg  20 mg Oral QHS Qianna Clagett H, NP   20 mg at 07/11/23 2122   sertraline (ZOLOFT) tablet 50 mg  50 mg Oral Daily Jude Linck H, NP   50 mg at 07/12/23 0748   Ubrogepant TABS 100 mg  100 mg Oral Daily PRN Earney Navy, NP        Lab Results:  Results for orders placed or performed during the hospital encounter of 07/09/23 (from the past 48 hour(s))  Glucose, capillary     Status: Abnormal   Collection Time: 07/10/23 11:30 AM  Result Value Ref Range   Glucose-Capillary  450 (H) 70 - 99 mg/dL    Comment: Glucose reference range applies only to samples taken after fasting for at least 8 hours.  Glucose, capillary     Status: Abnormal   Collection Time: 07/10/23  4:47 PM  Result Value Ref Range   Glucose-Capillary 218 (H) 70 - 99 mg/dL    Comment: Glucose reference range applies only to samples taken after fasting for at least 8 hours.  Glucose, capillary     Status: Abnormal   Collection Time: 07/10/23  8:42 PM  Result Value Ref Range   Glucose-Capillary 291 (H) 70 - 99 mg/dL    Comment: Glucose reference range applies only to samples taken after fasting for at least 8 hours.   Comment 1 Notify RN    Comment 2 Document in Chart   Glucose, capillary     Status: Abnormal   Collection Time: 07/11/23  6:04 AM  Result Value Ref Range   Glucose-Capillary 248 (H) 70 - 99 mg/dL    Comment: Glucose reference range applies  only to samples taken after fasting for at least 8 hours.   Comment 1 Notify RN    Comment 2 Document in Chart   Glucose, capillary     Status: Abnormal   Collection Time: 07/11/23  9:08 AM  Result Value Ref Range   Glucose-Capillary 196 (H) 70 - 99 mg/dL    Comment: Glucose reference range applies only to samples taken after fasting for at least 8 hours.  Glucose, capillary     Status: Abnormal   Collection Time: 07/11/23 12:11 PM  Result Value Ref Range   Glucose-Capillary 420 (H) 70 - 99 mg/dL    Comment: Glucose reference range applies only to samples taken after fasting for at least 8 hours.  Glucose, capillary     Status: Abnormal   Collection Time: 07/11/23  1:56 PM  Result Value Ref Range   Glucose-Capillary 359 (H) 70 - 99 mg/dL    Comment: Glucose reference range applies only to samples taken after fasting for at least 8 hours.  Glucose, capillary     Status: Abnormal   Collection Time: 07/11/23  5:16 PM  Result Value Ref Range   Glucose-Capillary 133 (H) 70 - 99 mg/dL    Comment: Glucose reference range applies only to  samples taken after fasting for at least 8 hours.  Glucose, capillary     Status: Abnormal   Collection Time: 07/11/23  8:58 PM  Result Value Ref Range   Glucose-Capillary 113 (H) 70 - 99 mg/dL    Comment: Glucose reference range applies only to samples taken after fasting for at least 8 hours.   Comment 1 Notify RN    Comment 2 Document in Chart   Glucose, capillary     Status: Abnormal   Collection Time: 07/12/23  5:52 AM  Result Value Ref Range   Glucose-Capillary 272 (H) 70 - 99 mg/dL    Comment: Glucose reference range applies only to samples taken after fasting for at least 8 hours.   Comment 1 Notify RN    Comment 2 Document in Chart   Glucose, capillary     Status: Abnormal   Collection Time: 07/12/23 10:55 AM  Result Value Ref Range   Glucose-Capillary 194 (H) 70 - 99 mg/dL    Comment: Glucose reference range applies only to samples taken after fasting for at least 8 hours.    Blood Alcohol level:  Lab Results  Component Value Date   [facility] <10 07/08/2023   ETH <11 05/28/2014    Metabolic Disorder Labs: Lab Results  Component Value Date   HGBA1C 10.8 (H) 07/08/2023   MPG 263.26 07/08/2023   MPG 220 11/02/2022   No results found for: "PROLACTIN" Lab Results  Component Value Date   CHOL 127 11/02/2022   TRIG 56 11/02/2022   HDL 66 11/02/2022   CHOLHDL 1.9 11/02/2022   VLDL 10 04/22/2014   LDLCALC 47 11/02/2022   LDLCALC 91 04/04/2021    Physical Findings: AIMS:  , ,  ,  ,    CIWA:    COWS:     Musculoskeletal: Strength & Muscle Tone: within normal limits Gait & Station: normal Patient leans: N/A  Psychiatric Specialty Exam:  Presentation  General Appearance:  Casual; Appropriate for Environment; Fairly Groomed  Eye Contact: Good  Speech: Normal Rate  Speech Volume: Normal  Handedness: Right   Mood and Affect  Mood: Anxious  Affect: Congruent; Appropriate; Full Range   Thought Process  Thought Processes: Linear  Descriptions  of Associations:Intact  Orientation:Full (Time, Place and Person)  Thought Content:Logical  History of Schizophrenia/Schizoaffective disorder:No data recorded Duration of Psychotic Symptoms:No data recorded Hallucinations:Hallucinations: None  Ideas of Reference:None  Suicidal Thoughts:Suicidal Thoughts: No  Homicidal Thoughts:Homicidal Thoughts: No   Sensorium  Memory: Immediate Good; Recent Good; Remote Good  Judgment: Fair  Insight: Fair   Art therapist  Concentration: Fair  Attention Span: Good  Recall: Good  Fund of Knowledge: Good  Language: Good   Psychomotor Activity  Psychomotor Activity: Psychomotor Activity: Normal   Assets  Assets: Desire for Improvement; Social Support; Housing   Sleep  Sleep: Sleep: Fair    Physical Exam: Physical Exam Vitals reviewed.  Pulmonary:     Effort: Pulmonary effort is normal.  Neurological:     Mental Status: She is alert.     Motor: No weakness.     Gait: Gait normal.  Psychiatric:        Behavior: Behavior normal.        Thought Content: Thought content normal.        Judgment: Judgment normal.    Review of Systems  Constitutional:  Negative for chills and fever.  Cardiovascular:  Negative for chest pain and palpitations.  Neurological:  Negative for dizziness, tingling, tremors and headaches.  Psychiatric/Behavioral:  Positive for depression. Negative for hallucinations, memory loss, substance abuse and suicidal ideas. The patient is nervous/anxious. The patient does not have insomnia.   All other systems reviewed and are negative.  Blood pressure 114/78, pulse (!) 112, temperature 98.3 F (36.8 C), temperature source Oral, resp. rate 16, height 5\' 6"  (1.676 m), weight 79.6 kg, SpO2 100%. Body mass index is 28.31 kg/m.   Treatment Plan Summary: ASSESSMENT:   Diagnoses / Active Problems:  Bipolar disorder, current episode depressed, severe, without psychotic features    Generalized anxiety disorder   PLAN: Safety and Monitoring:             -- Voluntary admission to inpatient psychiatric unit for safety, stabilization and treatment             -- Daily contact with patient to assess and evaluate symptoms and progress in treatment             -- Patient's case to be discussed in multi-disciplinary team meeting             -- Observation Level : q15 minute checks             -- Vital signs:  q12 hours             -- Precautions: suicide, elopement, and assault   2. Psychiatric Diagnoses and Treatment:              -Continue Vraylar 3 mg daily at bedtime for bipolar depression  -Continue Sertraline 50 mg daily for bipolar depression  -Hold Adderall      BH Agitation PRNs -Benadryl 50mg  oral or IM 3 times daily PRN agitation -Haldol 5mg  oral or IM 3 times daily PRN agitation -Ativan 2mg  oral or Im 3 times daily PRN agitation   --  The risks/benefits/side-effects/alternatives to this medication were discussed in detail with the patient and time was given for questions. The patient consents to medication trial.              -- Metabolic profile and EKG monitoring obtained while on an atypical antipsychotic (BMI: Lipid Panel: HbgA1c: QTc:)              --  Encouraged patient to participate in unit milieu and in scheduled group therapies              -- Short Term Goals: Ability to verbalize feelings will improve, Ability to disclose and discuss suicidal ideas, Ability to demonstrate self-control will improve, and Ability to identify and develop effective coping behaviors will improve             -- Long Term Goals: Improvement in symptoms so as ready for discharge                3. Medical Issues Being Addressed:                           DM Type 1             -Diabetes Coordinator consulted              -Semglee 20 unit BID             -Novolog 10 Units TID with meals             -CBGs AC/HS                                       CKD              -Restart home medication      PRNs -Tylenol 650mg  oral every 6 hours PRN mild pain -Maalox/Mylanta 30mL oral every 4 hours PRN indigestion -Milk of Magnesia 30mL oral daily PRN mild constipation -Zofran-ODT 4 mg oral every 8 hours PRN, nausea, vomiting -Ubbrogepant 100 mg, daily PRN for migraine headaches                   4. Discharge Planning:              -- Social work and case management to assist with discharge planning and identification of hospital follow-up needs prior to discharge             -- Estimated LOS: 3-4 more days             -- Discharge Concerns: Need to establish a safety plan; Medication compliance and effectiveness             -- Discharge Goals: Return home with outpatient referrals for mental health follow-up including medication management/psychotherapy       Norma Fredrickson, NP 07/12/2023, 11:09 AM  Total Time Spent in Direct Patient Care:  I personally spent 35 minutes on the unit in direct patient care. The direct patient care time included face-to-face time with the patient, reviewing the patient's chart, communicating with other professionals, and coordinating care. Greater than 50% of this time was spent in counseling or coordinating care with the patient regarding goals of hospitalization, psycho-education, and discharge planning needs.      Patient ID: Marquel Hake, female   DOB: Feb 14, 1986, 37 y.o.   MRN: [id number]

## 2023-07-12 NOTE — Group Note (Signed)
LCSW Group Therapy Note   Group Date: 07/12/2023 Start Time: 1100 End Time: 1200   Type of Therapy and Topic:  Group Therapy: Boundaries  Participation Level:  None  Description of Group: This group will address the use of boundaries in their personal lives. Patients will explore why boundaries are important, the difference between healthy and unhealthy boundaries, and negative and postive outcomes of different boundaries and will look at how boundaries can be crossed.  Patients will be encouraged to identify current boundaries in their own lives and identify what kind of boundary is being set. Facilitators will guide patients in utilizing problem-solving interventions to address and correct types boundaries being used and to address when no boundary is being used. Understanding and applying boundaries will be explored and addressed for obtaining and maintaining a balanced life. Patients will be encouraged to explore ways to assertively make their boundaries and needs known to significant others in their lives, using other group members and facilitator for role play, support, and feedback.  Therapeutic Goals:  1.  Patient will identify areas in their life where setting clear boundaries could be  used to improve their life.  2.  Patient will identify signs/triggers that a boundary is not being respected. 3.  Patient will identify two ways to set boundaries in order to achieve balance in  their lives: 4.  Patient will demonstrate ability to communicate their needs and set boundaries  through discussion and/or role plays  Summary of Patient Progress:  Patient demonstrated no insight into the subject matter, was respectful of peers, and was present throughout the entire session.  Therapeutic Modalities:   Cognitive Behavioral Therapy Solution-Focused Therapy  Marinda Elk, [place] 07/12/2023  3:01 PM

## 2023-07-12 NOTE — Progress Notes (Signed)
   07/12/23 2200  Psych Admission Type (Psych Patients Only)  Admission Status Voluntary  Date 72 hour document signed  07/10/23  Time 72 hour document signed  1644  Psychosocial Assessment  Patient Complaints None  Eye Contact Fair  Facial Expression Animated  Affect Appropriate to circumstance  Speech Logical/coherent  Interaction Assertive  Motor Activity Slow  Appearance/Hygiene Unremarkable  Behavior Characteristics Cooperative;Appropriate to situation  Mood Pleasant  Thought Process  Coherency WDL  Content WDL  Delusions None reported or observed  Perception WDL  Hallucination None reported or observed  Judgment Poor  Confusion None  Danger to Self  Current suicidal ideation? Denies  Agreement Not to Harm Self Yes  Description of Agreement verbal  Danger to Others  Danger to Others None reported or observed

## 2023-07-12 NOTE — [facility] Group Notes (Signed)
Adult Psychoeducational Group Note  Date:  07/12/2023 Time:  3:16 AM  Group Topic/Focus:  Wrap-Up Group:   The focus of this group is to help patients review their daily goal of treatment and discuss progress on daily workbooks.  Participation Level:  Active  Participation Quality:  Appropriate  Affect:  Appropriate  Cognitive:  Appropriate  Insight: Appropriate  Engagement in Group:  Engaged  Modes of Intervention:  Support  Additional Comments:  Pt attend group today rating today a 8 out of 10. Pt goal is to smile more and to put herself first. The most helpful coping skills is to find more joy. Pt and group stated that positive hobbies can also be helpful coping skills.   Satira Anis 07/12/2023, 3:16 AM

## 2023-07-12 NOTE — Group Note (Signed)
Date:  07/12/2023 Time:  9:05 AM  Group Topic/Focus:  Goals Group:   The focus of this group is to help patients establish daily goals to achieve during treatment and discuss how the patient can incorporate goal setting into their daily lives to aide in recovery.    Participation Level:  Active  Participation Quality:  Appropriate  Affect:  Appropriate  Cognitive:  Alert  Insight: Appropriate  Engagement in Group:  Engaged  Modes of Intervention:  Discussion  Additional Comments:  Pt stated that her goal for today was to stay positive.   Beckie Busing 07/12/2023, 9:05 AM

## 2023-07-13 DIAGNOSIS — F314 Bipolar disorder, current episode depressed, severe, without psychotic features: Secondary | ICD-10-CM | POA: Diagnosis not present

## 2023-07-13 LAB — GLUCOSE, CAPILLARY
Glucose-Capillary: 215 mg/dL — ABNORMAL HIGH (ref 70–99)
Glucose-Capillary: 123 mg/dL — ABNORMAL HIGH (ref 70–99)
Glucose-Capillary: 87 mg/dL (ref 70–99)

## 2023-07-13 MED ORDER — SERTRALINE HCL 50 MG PO TABS: 50.0000 mg | ORAL_TABLET | Freq: Every day | ORAL | 0 refills | Status: DC

## 2023-07-13 MED ORDER — CARIPRAZINE HCL 3 MG PO CAPS: 3.0000 mg | ORAL_CAPSULE | Freq: Every day | ORAL | 0 refills | Status: AC

## 2023-07-13 MED ORDER — ASPIRIN 81 MG PO CHEW: 81.0000 mg | CHEWABLE_TABLET | Freq: Every day | ORAL | Status: AC

## 2023-07-13 MED ORDER — PRAVASTATIN SODIUM 20 MG PO TABS: 20.0000 mg | ORAL_TABLET | Freq: Every day | ORAL | 0 refills | Status: DC

## 2023-07-13 NOTE — Discharge Instructions (Signed)

## 2023-07-13 NOTE — [facility] Suicide Risk Assessment (Signed)
[facility] Discharge Suicide Risk Assessment   Principal Problem: Bipolar disorder, current episode depressed, severe, without psychotic features [facility]) Discharge Diagnoses: Principal Problem:   Bipolar disorder, current episode depressed, severe, without psychotic features (HCC) Active Problems:   Generalized anxiety disorder   Total Time spent with patient: 20 minutes  Patient is a 37 year old female with a past psychiatric history of bipolar disorder, was admitted to the [facility] from [facility] after an argument with her husband after taking six 0.5 mg clonazepam tablets.    During the patient's hospitalization, patient had extensive initial psychiatric evaluation, and follow-up psychiatric evaluations every day.   Psychiatric diagnoses provided upon initial assessment:  Bipolar disorder GAD   Patient's psychiatric medications were adjusted on admission:       -Decrease Vraylar from 4.5 mg to 3 mg daily at bedtime for BP depression  -Decrease Sertraline from 100 mg to 50 mg daily for bipolar depression  -Hold Adderall  -stop clonazepam    During the hospitalization, other adjustments were made to the patient's psychiatric medication regimen: none    Patient's care was discussed during the interdisciplinary team meeting every day during the hospitalization.   The patient denied having side effects to prescribed psychiatric medication.   Gradually, patient started adjusting to milieu. The patient was evaluated each day by a clinical provider to ascertain response to treatment. Improvement was noted by the patient's report of decreasing symptoms, improved sleep and appetite, affect, medication tolerance, behavior, and participation in unit programming.  Patient was asked each day to complete a self inventory noting mood, mental status, pain, new symptoms, anxiety and concerns.     Symptoms were reported as significantly decreased or resolved completely by  discharge.    On day of discharge, the patient reports that their mood is stable. The patient denied having suicidal thoughts for more than 48 hours prior to discharge.  Patient denies having homicidal thoughts.  Patient denies having auditory hallucinations.  Patient denies any visual hallucinations or other symptoms of psychosis. The patient was motivated to continue taking medication with a goal of continued improvement in mental health.    The patient reports their target psychiatric symptoms of depression and suicidal thoughts, all responded well to the psychiatric medications, and the patient reports overall benefit other psychiatric hospitalization. Supportive psychotherapy was provided to the patient. The patient also participated in regular group therapy while hospitalized. Coping skills, problem solving as well as relaxation therapies were also part of the unit programming.   Labs were reviewed with the patient, and abnormal results were discussed with the patient.   The patient is able to verbalize their individual safety plan to this provider.   # It is recommended to the patient to continue psychiatric medications as prescribed, after discharge from the hospital.     # It is recommended to the patient to follow up with your outpatient psychiatric provider and PCP.   # It was discussed with the patient, the impact of alcohol, drugs, tobacco have been there overall psychiatric and medical wellbeing, and total abstinence from substance use was recommended the patient.ed.   # Prescriptions provided or sent directly to preferred pharmacy at discharge. Patient agreeable to plan. Given opportunity to ask questions. Appears to feel comfortable with discharge.    # In the event of worsening symptoms, the patient is instructed to call the crisis hotline, 911 and or go to the nearest ED for appropriate evaluation and treatment of symptoms. To follow-up with  primary care provider for other medical  issues, concerns and or health care needs   # Patient was discharged to care of husband, with a plan to follow up as noted below.      Psychiatric Specialty Exam  Presentation  General Appearance:  Appropriate for Environment; Casual; Fairly Groomed  Eye Contact: Good  Speech: Normal Rate; Clear and Coherent  Speech Volume: Normal  Handedness: Right   Mood and Affect  Mood: Euthymic  Duration of Depression Symptoms: No data recorded Affect: Appropriate; Congruent; Full Range   Thought Process  Thought Processes: Linear  Descriptions of Associations:Intact  Orientation:Full (Time, Place and Person)  Thought Content:Logical  History of Schizophrenia/Schizoaffective disorder:No data recorded Duration of Psychotic Symptoms:No data recorded Hallucinations:Hallucinations: None  Ideas of Reference:None  Suicidal Thoughts:Suicidal Thoughts: No  Homicidal Thoughts:Homicidal Thoughts: No   Sensorium  Memory: Immediate Good; Recent Good; Remote Good  Judgment: Good  Insight: Good   Executive Functions  Concentration: Good  Attention Span: Good  Recall: Good  Fund of Knowledge: Good  Language: Good   Psychomotor Activity  Psychomotor Activity: Psychomotor Activity: Normal   Assets  Assets: Desire for Improvement; Social Support; Housing   Sleep  Sleep: Sleep: Fair   Physical Exam: Physical Exam ROS Blood pressure 121/79, pulse (!) 107, temperature 98.9 F (37.2 C), temperature source Oral, resp. rate 16, height 5\' 6"  (1.676 m), weight 79.6 kg, SpO2 100%. Body mass index is 28.31 kg/m.  Mental Status Per Nursing Assessment::   On Admission:  Suicidal ideation indicated by patient  Demographic factors:  Low socioeconomic status, Unemployed, Caucasian Loss Factors:  Financial problems / change in socioeconomic status Historical Factors:  Prior suicide attempts Risk Reduction Factors:  Positive social support, Sense of  responsibility to family, Responsible for children under 30 years of age  Continued Clinical Symptoms:  Mood is stable. Denies SI.   Cognitive Features That Contribute To Risk:  None    Suicide Risk:  Mild:  There are no identifiable suicide plans, no associated intent, mild dysphoria and related symptoms, good self-control (both objective and subjective assessment), few other risk factors, and identifiable protective factors, including available and accessible social support.    Follow-up Information     Services, [facility] Follow up.   Why: You may go this local provider to obtain therapy and medication management services. Contact information: [address] [place] [address] [phone]         Lauraine Rinne., MD. Go on 08/28/2023.   Specialty: Psychiatry Why: You have a medication management appointment on 08/28/23 at 10:00 am.  Therapy services will be available with this provider in the future, and references are also available. Contact information: [address] [place] [address] [phone]                 Plan Of Care/Follow-up recommendations:   -Follow-up with your outpatient psychiatric provider -instructions on appointment date, time, and address (location) are provided to you in discharge paperwork.   -Take your psychiatric medications as prescribed at discharge - instructions are provided to you in the discharge paperwork   -Follow-up with outpatient primary care doctor and other specialists -for management of preventative medicine and chronic medical disease   -If you are prescribed an atypical antipsychotic medication, we recommend that your outpatient psychiatrist follow routine screening for side effects within 3 months of discharge, including monitoring: AIMS scale, height, weight, blood pressure, fasting lipid panel, HbA1c, and fasting blood  sugar.    -Recommend total abstinence from alcohol,  tobacco, and other illicit drug use at discharge.    -If your psychiatric symptoms recur, worsen, or if you have side effects to your psychiatric medications, call your outpatient psychiatric provider, 911, [phone] or go to the nearest emergency department.   -If suicidal thoughts occur, immediately call your outpatient psychiatric provider, 911, [phone] or go to the nearest emergency department.        Cristy Hilts, MD 07/13/2023, 8:26 AM

## 2023-07-13 NOTE — Plan of Care (Signed)

## 2023-07-13 NOTE — Progress Notes (Signed)
  [facility] Adult Case Management Discharge Plan :  Will you be returning to the same living situation after discharge:  Yes,  (Spouse) At discharge, do you have transportation home?: Yes,  Spouse/Caleb Grisby Do you have the ability to pay for your medications: Yes,  Insured  Release of information consent forms completed and in the chart;  Patient's signature needed at discharge.  Patient to Follow up at:  Follow-up Information     [facility], [facility] Follow up.   Why: You may go this local provider to obtain therapy and medication management services. Contact information: [address] [place] [address] [phone]         Lauraine Rinne., MD. Go on 08/28/2023.   Specialty: Psychiatry Why: You have a medication management appointment on 08/28/23 at 10:00 am.  Therapy services will be available with this provider in the future, and references are also available. Contact information: [address] [place] [address] [phone]                 Next level of care provider has access to [facility] Link:yes  Safety Planning and Suicide Prevention discussed: Yes,  Mother/Brenda Pricilla Holm [phone]     Has patient been referred to the Quitline?: Patient does not use tobacco/nicotine products  Patient has been referred for addiction treatment: Yes, referral information given but appointment not made [facility] (list facility). Patient to continue working towards treatment goals after discharge. Patient no longer meets criteria for inpatient criteria per attending physician. Continue taking medications as prescribed, nursing to provide instructions at discharge. Follow up with all scheduled appointments.   Jlyn Bracamonte S Asuncion Shibata, LCSW 07/13/2023, 9:08 AM

## 2023-07-13 NOTE — Plan of Care (Signed)
°  Problem: Education: °Goal: Emotional status will improve °Outcome: Progressing °Goal: Mental status will improve °Outcome: Progressing °  °Problem: Activity: °Goal: Interest or engagement in activities will improve °Outcome: Progressing °  °

## 2023-07-13 NOTE — Progress Notes (Signed)
Patient discharged from [facility] on 07/13/27. Patient denies SI, plan, and intention. Suicide safety plan completed, reviewed with this RN, given to the patient, and a copy in the chart. Patient denies HI/AVH upon discharge. Patient rates her depression a 0/10 and her anxiety a 0/10. Patient is alert, oriented, and cooperative. RN provided patient with discharge paperwork and reviewed information with patient. Patient expressed that she understood all of the discharge instructions. Pt was satisfied with belongings returned to her from the locker and at bedside. Discharged patient to [facility] waiting room.

## 2023-07-13 NOTE — Progress Notes (Signed)
   07/13/23 0800  Psych Admission Type (Psych Patients Only)  Admission Status Voluntary  Psychosocial Assessment  Patient Complaints None  Eye Contact Fair  Facial Expression Animated  Affect Appropriate to circumstance  Speech Logical/coherent  Interaction Assertive  Motor Activity Slow  Appearance/Hygiene Unremarkable  Behavior Characteristics Cooperative;Appropriate to situation  Mood Pleasant  Thought Process  Coherency WDL  Content WDL  Delusions None reported or observed  Perception WDL  Hallucination None reported or observed  Judgment Impaired  Confusion None  Danger to Self  Current suicidal ideation? Denies  Agreement Not to Harm Self Yes  Description of Agreement Verbal  Danger to Others  Danger to Others None reported or observed

## 2023-07-13 NOTE — Discharge Summary (Signed)
Physician Discharge Summary Note  Patient:  [name] is an 37 y.o., female MRN:  [id number] DOB:  [birth year] Patient phone:  [phone] (home)  Patient address:   [address] [place] [address],  Total Time spent with patient: 20 minutes  Date of Admission:  07/09/2023 Date of Discharge: 07-13-2023  Reason for Admission:   Patient is a 37 year old female with a past psychiatric history of bipolar disorder, was admitted to the [facility] from [facility] after an argument with her husband after taking six 0.5 mg clonazepam tablets.    Principal Problem: Bipolar disorder, current episode depressed, severe, without psychotic features [facility]) Discharge Diagnoses: Principal Problem:   Bipolar disorder, current episode depressed, severe, without psychotic features (HCC) Active Problems:   Generalized anxiety disorder   Past Psychiatric History:  Past psychiatric diagnoses include bipolar disorder, depression, anxiety, and ADD. She reports a history of three suicide attempts: a prescription pill overdose, an insulin overdose, and an attempt to jump out of a window. She reports she was here at the [facility] in 2015 following a suicide attempt before her bipolar diagnosis and medication. She is followed by Dr. Jennelle Human at [facility], with an upcoming appointment in November. She received outpatient counseling through [facility] with Trula Ore and wishes to resume sessions. She reports a "manic episode" four months ago with hyperverbal speech, racing thoughts, impulsiveness, and lack of sleep.   Past Medical History:  Past Medical History:  Diagnosis Date   Anemia    Anemia of chronic disease 06/07/2012   Formatting of this note might be different from the original. Hematology consultation 07-13-16 (see note)--> Anemia of chronic disease Required 3 transfusions during pregnancy  Last Assessment & Plan:  Formatting of  this note might be different from the original. She has now required 3 blood transfusions this pregnancy for her anemia of chronic disease. The most recent was one week ago when she bec   Anxiety    Asthma    Asthma 11/10/2013   Back pain    Bipolar 1 disorder (HCC)    Chest pain    Chronic renal failure syndrome, stage 3 (moderate) (HCC)    Colles' fracture of left radius 10/11/2013   Complication of anesthesia    Constipation    Depression    Depression    Diabetic gastroparesis (HCC)    Diabetic neuropathy, type I diabetes mellitus (HCC)    Diabetic ulcer of right great toe (HCC) 08/27/2020   Edema, lower extremity    Gallbladder disease    Gastroparesis    GERD (gastroesophageal reflux disease)    Headache(784.0)    Hypertension    Joint pain    Kidney stones    Palpitations    Polyneuropathy in diabetes(357.2)    PONV (postoperative nausea and vomiting)    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)    S/P carpal tunnel release left 10/16/19 11/04/2019   Shortness of breath    Tachycardia    baseline tachycardia    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)     Past Surgical History:  Procedure Laterality Date   ANAL RECTAL MANOMETRY N/A 03/25/2018   Procedure: ANO RECTAL MANOMETRY;  Surgeon: Sherrilyn Rist, MD;  Location: [facility];  Service: Gastroenterology;  Laterality: N/A;   CARPAL TUNNEL RELEASE Left 10/16/2019   Procedure: LEFT CARPAL TUNNEL RELEASE;  Surgeon: Vickki Hearing, MD;  Location: [facility];  Service:  Orthopedics;  Laterality: Left;   CESAREAN SECTION     x 2   CHOLECYSTECTOMY     EYE SURGERY     REFRACTIVE SURGERY Bilateral    Family History:  Family History  Problem Relation Age of Onset   Diabetes Mother        type 2   Hypertension Mother    Obesity Mother    Diabetes Father    Hypertension Father    High Cholesterol Father    Sleep apnea Father    Obesity Father    Heart disease Father    Heart  attack Father    Diabetes Brother        type 1   Renal Disease Brother        renal failure   Hypertension Brother    Hypertension Maternal Grandmother    Breast cancer Maternal Aunt 64   Colon cancer Maternal Aunt    Uterine cancer Maternal Aunt 49   Breast cancer Other    Rectal cancer Neg Hx    Esophageal cancer Neg Hx    Family Psychiatric  History:  See H&P    Social History:  Social History   Substance and Sexual Activity  Alcohol Use No     Social History   Substance and Sexual Activity  Drug Use No    Social History   Socioeconomic History   Marital status: Married    Spouse name: Not on file   Number of children: 2   Years of education: 12   Highest education level: 12th grade  Occupational History   Occupation: Arts development officer  Tobacco Use   Smoking status: Never   Smokeless tobacco: Never  Vaping Use   Vaping status: Never Used  Substance and Sexual Activity   Alcohol use: No   Drug use: No   Sexual activity: Yes    Partners: Male    Birth control/protection: I.U.D.    Comment: Mirena  Other Topics Concern   Not on file  Social History Narrative   Pt has a daughter and boyfriend.    Dad passed away suddenly heart attack 12/2019   Social Determinants of Health   Financial Resource Strain: Medium Risk (12/07/2022)   Overall Financial Resource Strain (CARDIA)    Difficulty of Paying Living Expenses: Somewhat hard  Food Insecurity: Food Insecurity Present (07/09/2023)   Hunger Vital Sign    Worried About Running Out of Food in the Last Year: Sometimes true    Ran Out of Food in the Last Year: Sometimes true  Transportation Needs: No Transportation Needs (07/09/2023)   PRAPARE - Administrator, Civil Service (Medical): No    Lack of Transportation (Non-Medical): No  Physical Activity: Insufficiently Active (12/07/2022)   Exercise Vital Sign    Days of Exercise per Week: 2 days    Minutes of Exercise per Session: 30 min  Stress: No  Stress Concern Present (12/07/2022)   [employer] of Occupational Health - Occupational Stress Questionnaire    Feeling of Stress : Only a little  Social Connections: Moderately Integrated (12/07/2022)   Social Connection and Isolation Panel [NHANES]    Frequency of Communication with Friends and Family: More than three times a week    Frequency of Social Gatherings with Friends and Family: Once a week    Attends Religious Services: More than 4 times per year    Active Member of [employer] or Organizations: No    Attends Banker Meetings:  Never    Marital Status: Married    Hospital Course:   During the patient's hospitalization, patient had extensive initial psychiatric evaluation, and follow-up psychiatric evaluations every day.  Psychiatric diagnoses provided upon initial assessment:  Bipolar disorder GAD  Patient's psychiatric medications were adjusted on admission:       -Decrease Vraylar from 4.5 mg to 3 mg daily at bedtime for BP depression  -Decrease Sertraline from 100 mg to 50 mg daily for bipolar depression  -Hold Adderall  -stop clonazepam   During the hospitalization, other adjustments were made to the patient's psychiatric medication regimen: none   Patient's care was discussed during the interdisciplinary team meeting every day during the hospitalization.  The patient denied having side effects to prescribed psychiatric medication.  Gradually, patient started adjusting to milieu. The patient was evaluated each day by a clinical provider to ascertain response to treatment. Improvement was noted by the patient's report of decreasing symptoms, improved sleep and appetite, affect, medication tolerance, behavior, and participation in unit programming.  Patient was asked each day to complete a self inventory noting mood, mental status, pain, new symptoms, anxiety and concerns.    Symptoms were reported as significantly decreased or resolved completely by  discharge.   On day of discharge, the patient reports that their mood is stable. The patient denied having suicidal thoughts for more than 48 hours prior to discharge.  Patient denies having homicidal thoughts.  Patient denies having auditory hallucinations.  Patient denies any visual hallucinations or other symptoms of psychosis. The patient was motivated to continue taking medication with a goal of continued improvement in mental health.   The patient reports their target psychiatric symptoms of depression and suicidal thoughts, all responded well to the psychiatric medications, and the patient reports overall benefit other psychiatric hospitalization. Supportive psychotherapy was provided to the patient. The patient also participated in regular group therapy while hospitalized. Coping skills, problem solving as well as relaxation therapies were also part of the unit programming.  Labs were reviewed with the patient, and abnormal results were discussed with the patient.  The patient is able to verbalize their individual safety plan to this provider.  # It is recommended to the patient to continue psychiatric medications as prescribed, after discharge from the hospital.    # It is recommended to the patient to follow up with your outpatient psychiatric provider and PCP.  # It was discussed with the patient, the impact of alcohol, drugs, tobacco have been there overall psychiatric and medical wellbeing, and total abstinence from substance use was recommended the patient.ed.  # Prescriptions provided or sent directly to preferred pharmacy at discharge. Patient agreeable to plan. Given opportunity to ask questions. Appears to feel comfortable with discharge.    # In the event of worsening symptoms, the patient is instructed to call the crisis hotline, 911 and or go to the nearest ED for appropriate evaluation and treatment of symptoms. To follow-up with primary care provider for other medical issues,  concerns and or health care needs  # Patient was discharged to care of husband, with a plan to follow up as noted below.   Physical Findings: AIMS:  , ,  ,  ,    CIWA:    COWS:     Aims score zero on my exam. No eps on my exam.   Musculoskeletal: Strength & Muscle Tone: within normal limits Gait & Station: normal Patient leans: N/A   Psychiatric Specialty Exam:  Presentation  General  Appearance:  Appropriate for Environment; Casual; Fairly Groomed  Eye Contact: Good  Speech: Normal Rate; Clear and Coherent  Speech Volume: Normal  Handedness: Right   Mood and Affect  Mood: Euthymic  Affect: Appropriate; Congruent; Full Range   Thought Process  Thought Processes: Linear  Descriptions of Associations:Intact  Orientation:Full (Time, Place and Person)  Thought Content:Logical  History of Schizophrenia/Schizoaffective disorder:No data recorded Duration of Psychotic Symptoms:No data recorded Hallucinations:Hallucinations: None  Ideas of Reference:None  Suicidal Thoughts:Suicidal Thoughts: No  Homicidal Thoughts:Homicidal Thoughts: No   Sensorium  Memory: Immediate Good; Recent Good; Remote Good  Judgment: Good  Insight: Good   Executive Functions  Concentration: Good  Attention Span: Good  Recall: Good  Fund of Knowledge: Good  Language: Good   Psychomotor Activity  Psychomotor Activity: Psychomotor Activity: Normal   Assets  Assets: Desire for Improvement; Social Support; Housing   Sleep  Sleep: Sleep: Fair    Physical Exam: Physical Exam Vitals reviewed.  Pulmonary:     Effort: Pulmonary effort is normal.  Neurological:     Mental Status: She is alert.     Motor: No weakness.     Gait: Gait normal.  Psychiatric:        Mood and Affect: Mood normal.        Behavior: Behavior normal.        Thought Content: Thought content normal.        Judgment: Judgment normal.    Review of Systems   Constitutional:  Negative for chills and fever.  Cardiovascular:  Negative for chest pain and palpitations.  Neurological:  Negative for dizziness, tingling, tremors and headaches.  Psychiatric/Behavioral:  Negative for depression, hallucinations, memory loss, substance abuse and suicidal ideas. The patient is not nervous/anxious and does not have insomnia.   All other systems reviewed and are negative.  Blood pressure 121/79, pulse (!) 107, temperature 98.9 F (37.2 C), temperature source Oral, resp. rate 16, height 5\' 6"  (1.676 m), weight 79.6 kg, SpO2 100%. Body mass index is 28.31 kg/m.   Social History   Tobacco Use  Smoking Status Never  Smokeless Tobacco Never   Tobacco Cessation:  N/A, patient does not currently use tobacco products   Blood Alcohol level:  Lab Results  Component Value Date   ETH <10 07/08/2023   ETH <11 05/28/2014    Metabolic Disorder Labs:  Lab Results  Component Value Date   HGBA1C 10.8 (H) 07/08/2023   MPG 263.26 07/08/2023   MPG 220 11/02/2022   No results found for: "PROLACTIN" Lab Results  Component Value Date   CHOL 127 11/02/2022   TRIG 56 11/02/2022   HDL 66 11/02/2022   CHOLHDL 1.9 11/02/2022   VLDL 10 04/22/2014   LDLCALC 47 11/02/2022   LDLCALC 91 04/04/2021    See Psychiatric Specialty Exam and Suicide Risk Assessment completed by Attending Physician prior to discharge.  Discharge destination:  Home  Is patient on multiple antipsychotic therapies at discharge:  No   Has Patient had three or more failed trials of antipsychotic monotherapy by history:  No  Recommended Plan for Multiple Antipsychotic Therapies: NA  Discharge Instructions     Diet - low sodium heart healthy   Complete by: As directed    Increase activity slowly   Complete by: As directed       Allergies as of 07/13/2023       Reactions   Nsaids Other (See Comments)   Due to Renal disease   Other  Toradol [ketorolac Tromethamine]    Cannot  take d/t kidney issues   Ceftin Rash        Medication List     STOP taking these medications    Adderall 20 MG tablet Generic drug: amphetamine-dextroamphetamine   carvedilol 6.25 MG tablet Commonly known as: COREG   clonazePAM 0.5 MG tablet Commonly known as: KLONOPIN   furosemide 40 MG tablet Commonly known as: LASIX       TAKE these medications      Indication  Accu-Chek Guide test strip Generic drug: glucose blood Use to check blood sugar 3 time a day  Indication: home device   albuterol (2.5 MG/3ML) 0.083% nebulizer solution Commonly known as: PROVENTIL Take 3 mLs (2.5 mg total) by nebulization every 6 (six) hours as needed for wheezing or shortness of breath.  Indication: Spasm of Lung Air Passages   albuterol 108 (90 Base) MCG/ACT inhaler Commonly known as: VENTOLIN HFA TAKE 2 PUFFS BY MOUTH EVERY 6 HOURS AS NEEDED FOR WHEEZE OR SHORTNESS OF BREATH  Indication: Asthma   aspirin 81 MG chewable tablet Chew 1 tablet (81 mg total) by mouth at bedtime.  Indication: home med   Breztri Aerosphere 160-9-4.8 MCG/ACT Aero Generic drug: Budeson-Glycopyrrol-Formoterol Inhale 2 puffs into the lungs 2 (two) times daily.  Indication: Chronic Bronchitis   cariprazine 3 MG capsule Commonly known as: VRAYLAR Take 1 capsule (3 mg total) by mouth daily. What changed:  medication strength how much to take  Indication: MIXED BIPOLAR AFFECTIVE DISORDER   Fasenra 30 MG/ML prefilled syringe Generic drug: benralizumab Inject into the skin every 3 (three) months.  Indication: Asthma with Excessive Eosinophil White Blood Cells   Glucagon Emergency 1 MG Kit Inject 1 mg into the skin as needed (hypoglycemia.).  Indication: Disorder with Low Blood Sugar   insulin lispro 100 UNIT/ML injection Commonly known as: HUMALOG Inject up to 150 units daily according to insulin pump settings.  Indication: Insulin-Dependent Diabetes   losartan 25 MG tablet Commonly known as:  COZAAR Take 25 mg by mouth daily.  Indication: High Blood Pressure   Mirena (52 MG) 20 MCG/24HR Iud Generic drug: levonorgestrel 1 each by Intrauterine route once.  Indication: Birth Control Treatment   montelukast 10 MG tablet Commonly known as: SINGULAIR Take 1 tablet (10 mg total) by mouth at bedtime.  Indication: Asthma   Mounjaro 5 MG/0.5ML Pen Generic drug: tirzepatide Inject 5 mg into the skin once a week. What changed: how much to take  Indication: Type 2 Diabetes   pravastatin 20 MG tablet Commonly known as: PRAVACHOL Take 1 tablet (20 mg total) by mouth at bedtime.  Indication: High Amount of Fats in the Blood   sertraline 50 MG tablet Commonly known as: ZOLOFT Take 1 tablet (50 mg total) by mouth daily. Start taking on: July 14, 2023 What changed:  medication strength how much to take  Indication: Generalized Anxiety Disorder   Ubrelvy 100 MG Tabs Generic drug: Ubrogepant Take 1 tablet (100 mg total) by mouth daily as needed (migraine headaches.).  Indication: Migraine Headache        Follow-up Information     Services, [facility] Follow up.   Why: You may go this local provider to obtain therapy and medication management services. Contact information: [address] [place] [address] [phone]         Lauraine Rinne., MD. Go on 08/28/2023.   Specialty: Psychiatry Why: You have a medication management appointment on 08/28/23 at  10:00 am.  Therapy services will be available with this provider in the future, and references are also available. Contact information: [address] [place] [address] [phone]                 Follow-up recommendations:    Activity: as tolerated  Diet: heart healthy  Other: -Follow-up with your outpatient psychiatric provider -instructions on appointment date, time, and address (location) are provided to you in discharge paperwork.  -Take your  psychiatric medications as prescribed at discharge - instructions are provided to you in the discharge paperwork  -Follow-up with outpatient primary care doctor and other specialists -for management of preventative medicine and chronic medical disease  -If you are prescribed an atypical antipsychotic medication, we recommend that your outpatient psychiatrist follow routine screening for side effects within 3 months of discharge, including monitoring: AIMS scale, height, weight, blood pressure, fasting lipid panel, HbA1c, and fasting blood sugar.   -Recommend total abstinence from alcohol, tobacco, and other illicit drug use at discharge.   -If your psychiatric symptoms recur, worsen, or if you have side effects to your psychiatric medications, call your outpatient psychiatric provider, 911, [phone] or go to the nearest emergency department.  -If suicidal thoughts occur, immediately call your outpatient psychiatric provider, 911, [phone] or go to the nearest emergency department.   Signed: Cristy Hilts, MD 07/13/2023, 8:23 AM   Total Time Spent in Direct Patient Care:  I personally spent 35 minutes on the unit in direct patient care. The direct patient care time included face-to-face time with the patient, reviewing the patient's chart, communicating with other professionals, and coordinating care. Greater than 50% of this time was spent in counseling or coordinating care with the patient regarding goals of hospitalization, psycho-education, and discharge planning needs.   Phineas Inches, MD Psychiatrist

## 2023-07-18 NOTE — Progress Notes (Unsigned)
[name] [address] [place] [address] Phone: [phone]   Assessment and Plan:     There are no diagnoses linked to this encounter.  ***   Pertinent previous records reviewed include ***   Follow Up: ***     Subjective:   I, Dryden Tapley, am serving as a Neurosurgeon for Doctor Richardean Sale   Chief Complaint: leg pain    HPI:    02/28/2023 Patient is a 37 year old female complaining of leg pain. Patient states that she had left pain for years, she was dx with bursitis, she used to get CSI but the pain has changed, she had pain when sitting for long time, and then has pain when trying to walk after sitting , pain radiates to the back of the leg, sleep is hard she is a side sleeper, no numbness or tingling, tylenol doesn't help with the pain, she states she used to hear hip pop but doesn't do that stretching anymore that would cause the popping, she is TTP in that area , she states she can feel a knot    03/26/2023 Patient states she had some pain while on vacation. Back pain has been more frequent when she is in a sitting position for a long period of time. Left hip has been killing her   04/17/2023 Patient states after CSI pain calmed down . Went swimming yesterday so she is a little flared    06/20/2023 Patient states past couple of weeks leg pain has flared. PT tomorrow    07/19/2023 Patient states  Relevant Historical Information: Hypertension, DM type I, CKD  Additional pertinent review of systems negative.   Current Outpatient Medications:    albuterol (PROVENTIL) (2.5 MG/3ML) 0.083% nebulizer solution, Take 3 mLs (2.5 mg total) by nebulization every 6 (six) hours as needed for wheezing or shortness of breath., Disp: 150 mL, Rfl: 1   albuterol (VENTOLIN HFA) 108 (90 Base) MCG/ACT inhaler, TAKE 2 PUFFS BY MOUTH EVERY 6 HOURS AS NEEDED FOR WHEEZE OR SHORTNESS OF BREATH, Disp: 18 each, Rfl: 2   aspirin 81 MG  chewable tablet, Chew 1 tablet (81 mg total) by mouth at bedtime., Disp: , Rfl:    Budeson-Glycopyrrol-Formoterol (BREZTRI AEROSPHERE) 160-9-4.8 MCG/ACT AERO, Inhale 2 puffs into the lungs 2 (two) times daily., Disp: , Rfl:    cariprazine (VRAYLAR) 3 MG capsule, Take 1 capsule (3 mg total) by mouth daily., Disp: 30 capsule, Rfl: 0   FASENRA 30 MG/ML SOSY, Inject into the skin every 3 (three) months., Disp: , Rfl:    GLUCAGON EMERGENCY 1 MG injection, Inject 1 mg into the skin as needed (hypoglycemia.)., Disp: , Rfl:    glucose blood (ACCU-CHEK GUIDE) test strip, Use to check blood sugar 3 time a day, Disp: 300 strip, Rfl: 4   insulin lispro (HUMALOG) 100 UNIT/ML injection, Inject up to 150 units daily according to insulin pump settings., Disp: 10 mL, Rfl: 0   levonorgestrel (MIRENA, 52 MG,) 20 MCG/24HR IUD, 1 each by Intrauterine route once., Disp: , Rfl:    losartan (COZAAR) 25 MG tablet, Take 25 mg by mouth daily., Disp: , Rfl:    montelukast (SINGULAIR) 10 MG tablet, Take 1 tablet (10 mg total) by mouth at bedtime., Disp: 90 tablet, Rfl: 1   pravastatin (PRAVACHOL) 20 MG tablet, Take 1 tablet (20 mg total) by mouth at bedtime., Disp: 30 tablet, Rfl: 0   sertraline (ZOLOFT) 50 MG tablet, Take  1 tablet (50 mg total) by mouth daily., Disp: 30 tablet, Rfl: 0   tirzepatide (MOUNJARO) 5 MG/0.5ML Pen, Inject 5 mg into the skin once a week. (Patient taking differently: Inject 7.5 mg into the skin once a week.), Disp: 6 mL, Rfl: 1   Ubrogepant (UBRELVY) 100 MG TABS, Take 1 tablet (100 mg total) by mouth daily as needed (migraine headaches.)., Disp: 30 tablet, Rfl: 3   Objective:     There were no vitals filed for this visit.    There is no height or weight on file to calculate BMI.    Physical Exam:    ***   Electronically signed by:  [name] 3:23 PM 07/18/23

## 2023-07-19 ENCOUNTER — Ambulatory Visit: Payer: Medicare Other | Admitting: Sports Medicine

## 2023-07-19 VITALS — BP 126/72 | HR 95 | Ht 66.0 in | Wt 153.0 lb

## 2023-07-19 DIAGNOSIS — S76012D Strain of muscle, fascia and tendon of left hip, subsequent encounter: Secondary | ICD-10-CM | POA: Diagnosis not present

## 2023-07-19 DIAGNOSIS — M25552 Pain in left hip: Secondary | ICD-10-CM

## 2023-07-19 MED ORDER — MELOXICAM 15 MG PO TABS: 15.0000 mg | ORAL_TABLET | Freq: Every day | ORAL | 0 refills | Status: DC

## 2023-07-19 NOTE — Patient Instructions (Signed)
-   Start meloxicam 15 mg daily x2 weeks.  If still having pain after 2 weeks, complete 3rd-week of meloxicam. May use remaining meloxicam as needed once daily for pain control.  Do not to use additional NSAIDs while taking meloxicam.  May use Tylenol [phone] mg 2 to 3 times a day for breakthrough pain. Recommend calling PT to establish care 4 week follow up

## 2023-08-15 NOTE — Progress Notes (Deleted)
[name] [facility] [address] [place] [address] Phone: [phone]   Assessment and Plan:     There are no diagnoses linked to this encounter.  ***   Pertinent previous records reviewed include ***   Follow Up: ***     Subjective:   I, Barry Culverhouse, am serving as a Neurosurgeon for Doctor Richardean Sale   Chief Complaint: leg pain    HPI:    02/28/2023 Patient is a 37 year old female complaining of leg pain. Patient states that she had left pain for years, she was dx with bursitis, she used to get CSI but the pain has changed, she had pain when sitting for long time, and then has pain when trying to walk after sitting , pain radiates to the back of the leg, sleep is hard she is a side sleeper, no numbness or tingling, tylenol doesn't help with the pain, she states she used to hear hip pop but doesn't do that stretching anymore that would cause the popping, she is TTP in that area , she states she can feel a knot    03/26/2023 Patient states she had some pain while on vacation. Back pain has been more frequent when she is in a sitting position for a long period of time. Left hip has been killing her   04/17/2023 Patient states after CSI pain calmed down . Went swimming yesterday so she is a little flared    06/20/2023 Patient states past couple of weeks leg pain has flared. PT tomorrow    07/19/2023 Patient states she is doing okay MRI caused some pain for a couple of days     08/16/2023 Patient states   Relevant Historical Information: Hypertension, DM type I, CKD  Additional pertinent review of systems negative.   Current Outpatient Medications:    albuterol (PROVENTIL) (2.5 MG/3ML) 0.083% nebulizer solution, Take 3 mLs (2.5 mg total) by nebulization every 6 (six) hours as needed for wheezing or shortness of breath., Disp: 150 mL, Rfl: 1   albuterol (VENTOLIN HFA) 108 (90 Base) MCG/ACT inhaler, TAKE 2 PUFFS BY MOUTH  EVERY 6 HOURS AS NEEDED FOR WHEEZE OR SHORTNESS OF BREATH, Disp: 18 each, Rfl: 2   aspirin 81 MG chewable tablet, Chew 1 tablet (81 mg total) by mouth at bedtime., Disp: , Rfl:    Budeson-Glycopyrrol-Formoterol (BREZTRI AEROSPHERE) 160-9-4.8 MCG/ACT AERO, Inhale 2 puffs into the lungs 2 (two) times daily., Disp: , Rfl:    FASENRA 30 MG/ML SOSY, Inject into the skin every 3 (three) months., Disp: , Rfl:    GLUCAGON EMERGENCY 1 MG injection, Inject 1 mg into the skin as needed (hypoglycemia.)., Disp: , Rfl:    glucose blood (ACCU-CHEK GUIDE) test strip, Use to check blood sugar 3 time a day, Disp: 300 strip, Rfl: 4   insulin lispro (HUMALOG) 100 UNIT/ML injection, Inject up to 150 units daily according to insulin pump settings., Disp: 10 mL, Rfl: 0   levonorgestrel (MIRENA, 52 MG,) 20 MCG/24HR IUD, 1 each by Intrauterine route once., Disp: , Rfl:    losartan (COZAAR) 25 MG tablet, Take 25 mg by mouth daily., Disp: , Rfl:    meloxicam (MOBIC) 15 MG tablet, Take 1 tablet (15 mg total) by mouth daily., Disp: 30 tablet, Rfl: 0   montelukast (SINGULAIR) 10 MG tablet, Take 1 tablet (10 mg total) by mouth at bedtime., Disp: 90 tablet, Rfl: 1   pravastatin (PRAVACHOL) 20 MG tablet, Take 1  tablet (20 mg total) by mouth at bedtime., Disp: 30 tablet, Rfl: 0   sertraline (ZOLOFT) 50 MG tablet, Take 1 tablet (50 mg total) by mouth daily., Disp: 30 tablet, Rfl: 0   tirzepatide (MOUNJARO) 5 MG/0.5ML Pen, Inject 5 mg into the skin once a week. (Patient taking differently: Inject 7.5 mg into the skin once a week.), Disp: 6 mL, Rfl: 1   Ubrogepant (UBRELVY) 100 MG TABS, Take 1 tablet (100 mg total) by mouth daily as needed (migraine headaches.)., Disp: 30 tablet, Rfl: 3   Objective:     There were no vitals filed for this visit.    There is no height or weight on file to calculate BMI.    Physical Exam:    ***   Electronically signed by:  [name] [facility] 7:26 AM 08/15/23

## 2023-08-16 ENCOUNTER — Ambulatory Visit: Payer: Medicare Other | Admitting: Sports Medicine

## 2023-08-20 NOTE — Progress Notes (Unsigned)
[name] [facility] [address] [place] [address] Phone: [phone]   Assessment and Plan:     There are no diagnoses linked to this encounter.  ***   Pertinent previous records reviewed include ***   Follow Up: ***     Subjective:   I, [name], am serving as a Neurosurgeon for Doctor Richardean Sale   Chief Complaint: leg pain    HPI:    02/28/2023 Patient is a 37 year old female complaining of leg pain. Patient states that she had left pain for years, she was dx with bursitis, she used to get CSI but the pain has changed, she had pain when sitting for long time, and then has pain when trying to walk after sitting , pain radiates to the back of the leg, sleep is hard she is a side sleeper, no numbness or tingling, tylenol doesn't help with the pain, she states she used to hear hip pop but doesn't do that stretching anymore that would cause the popping, she is TTP in that area , she states she can feel a knot    03/26/2023 Patient states she had some pain while on vacation. Back pain has been more frequent when she is in a sitting position for a long period of time. Left hip has been killing her   04/17/2023 Patient states after CSI pain calmed down . Went swimming yesterday so she is a little flared    06/20/2023 Patient states past couple of weeks leg pain has flared. PT tomorrow    07/19/2023 Patient states she is doing okay MRI caused some pain for a couple of days     08/21/2023 Patient states   Relevant Historical Information: Hypertension, DM type I, CKD  Additional pertinent review of systems negative.   Current Outpatient Medications:    albuterol (PROVENTIL) (2.5 MG/3ML) 0.083% nebulizer solution, Take 3 mLs (2.5 mg total) by nebulization every 6 (six) hours as needed for wheezing or shortness of breath., Disp: 150 mL, Rfl: 1   albuterol (VENTOLIN HFA) 108 (90 Base) MCG/ACT inhaler, TAKE 2 PUFFS BY MOUTH  EVERY 6 HOURS AS NEEDED FOR WHEEZE OR SHORTNESS OF BREATH, Disp: 18 each, Rfl: 2   aspirin 81 MG chewable tablet, Chew 1 tablet (81 mg total) by mouth at bedtime., Disp: , Rfl:    Budeson-Glycopyrrol-Formoterol (BREZTRI AEROSPHERE) 160-9-4.8 MCG/ACT AERO, Inhale 2 puffs into the lungs 2 (two) times daily., Disp: , Rfl:    FASENRA 30 MG/ML SOSY, Inject into the skin every 3 (three) months., Disp: , Rfl:    GLUCAGON EMERGENCY 1 MG injection, Inject 1 mg into the skin as needed (hypoglycemia.)., Disp: , Rfl:    glucose blood (ACCU-CHEK GUIDE) test strip, Use to check blood sugar 3 time a day, Disp: 300 strip, Rfl: 4   insulin lispro (HUMALOG) 100 UNIT/ML injection, Inject up to 150 units daily according to insulin pump settings., Disp: 10 mL, Rfl: 0   levonorgestrel (MIRENA, 52 MG,) 20 MCG/24HR IUD, 1 each by Intrauterine route once., Disp: , Rfl:    losartan (COZAAR) 25 MG tablet, Take 25 mg by mouth daily., Disp: , Rfl:    meloxicam (MOBIC) 15 MG tablet, Take 1 tablet (15 mg total) by mouth daily., Disp: 30 tablet, Rfl: 0   montelukast (SINGULAIR) 10 MG tablet, Take 1 tablet (10 mg total) by mouth at bedtime., Disp: 90 tablet, Rfl: 1   pravastatin (PRAVACHOL) 20 MG tablet, Take 1  tablet (20 mg total) by mouth at bedtime., Disp: 30 tablet, Rfl: 0   sertraline (ZOLOFT) 50 MG tablet, Take 1 tablet (50 mg total) by mouth daily., Disp: 30 tablet, Rfl: 0   tirzepatide (MOUNJARO) 5 MG/0.5ML Pen, Inject 5 mg into the skin once a week. (Patient taking differently: Inject 7.5 mg into the skin once a week.), Disp: 6 mL, Rfl: 1   Ubrogepant (UBRELVY) 100 MG TABS, Take 1 tablet (100 mg total) by mouth daily as needed (migraine headaches.)., Disp: 30 tablet, Rfl: 3   Objective:     There were no vitals filed for this visit.    There is no height or weight on file to calculate BMI.    Physical Exam:    ***   Electronically signed by:  [name] [facility] 7:48 AM 08/20/23

## 2023-08-21 ENCOUNTER — Ambulatory Visit (INDEPENDENT_AMBULATORY_CARE_PROVIDER_SITE_OTHER): Payer: Medicare Other | Admitting: Sports Medicine

## 2023-08-21 VITALS — BP 132/84 | Ht 66.0 in | Wt 153.0 lb

## 2023-08-21 DIAGNOSIS — S76012D Strain of muscle, fascia and tendon of left hip, subsequent encounter: Secondary | ICD-10-CM | POA: Diagnosis not present

## 2023-08-21 DIAGNOSIS — M25552 Pain in left hip: Secondary | ICD-10-CM | POA: Diagnosis not present

## 2023-08-21 NOTE — Patient Instructions (Signed)
Thank you for coming in.  - Start Tylenol 500 to 1000 mg tablets 2-3 times a day for day-to-day pain relief - Stop meloxicam use daily.  You may use remainder of medication as needed for breakthrough pain if Tylenol is not sufficient and pain control.  Recommend limiting meloxicam and other NSAID use to 1-2 times per week - Continue home exercise program - Recommend calling physical therapy to establish care.  If you need a new referral, please call and let us know and we can send in a new 1  Follow-up as needed if no improvement or worsening of symptoms

## 2023-08-22 ENCOUNTER — Encounter: Payer: Self-pay | Admitting: Psychiatry

## 2023-08-28 ENCOUNTER — Encounter: Payer: Self-pay | Admitting: Psychiatry

## 2023-08-28 ENCOUNTER — Ambulatory Visit: Payer: Medicare Other | Admitting: Psychiatry

## 2023-08-28 DIAGNOSIS — F3162 Bipolar disorder, current episode mixed, moderate: Secondary | ICD-10-CM | POA: Diagnosis not present

## 2023-08-28 DIAGNOSIS — F4001 Agoraphobia with panic disorder: Secondary | ICD-10-CM

## 2023-08-28 DIAGNOSIS — F411 Generalized anxiety disorder: Secondary | ICD-10-CM

## 2023-08-28 DIAGNOSIS — F902 Attention-deficit hyperactivity disorder, combined type: Secondary | ICD-10-CM | POA: Diagnosis not present

## 2023-08-28 DIAGNOSIS — F50811 Binge eating disorder, moderate: Secondary | ICD-10-CM

## 2023-08-28 DIAGNOSIS — G2581 Restless legs syndrome: Secondary | ICD-10-CM

## 2023-08-28 DIAGNOSIS — F5105 Insomnia due to other mental disorder: Secondary | ICD-10-CM

## 2023-08-28 MED ORDER — SERTRALINE HCL 50 MG PO TABS: 50.0000 mg | ORAL_TABLET | Freq: Every day | ORAL | 0 refills | Status: DC

## 2023-08-28 MED ORDER — CARIPRAZINE HCL 4.5 MG PO CAPS
4.5000 mg | ORAL_CAPSULE | Freq: Every day | ORAL | 0 refills | Status: DC
Start: 2023-08-28 — End: 2024-05-12

## 2023-08-28 NOTE — Progress Notes (Signed)
[name] [id number] 1986/02/22 37 y.o.    Subjective:   Patient ID:  [name] is a 37 y.o. (DOB August 03, 1986) female.  Chief Complaint:  Chief Complaint  Patient presents with   Follow-up   Depression   Anxiety   Stress    Depression        Associated symptoms include appetite change.  Past medical history includes anxiety.   Anxiety Symptoms include nervous/anxious behavior. Patient reports no nausea or palpitations.     [name] presents to the office today for follow-up of bipolar disorder and generalized anxiety disorder. Historically seen with [name].  When seen June 05, 2019.  The following changes were made: Change pramipexole ER and increase to 0.75 mg pm to rid nausea and improve mood in the afternoon.  For tiredness in the afternoon switch Equetro to 400 mg AM , 1@ 6 and 1 at 11. For panic in daytime start gabapentin 300 mg each am as preventative.  She's only taking it prn now. We continued unchanged Latuda 120 mg and lamotrigine 300 mg twice daily for bipolar disorder. She just got the ER pramipexole about a week ago.  Can't tell difference with nausea DT  gastroparesis worse lately with constipation Less afternoon tiredness with current split Equetro dose.    seen July 22, 2019.  Meds were not changed at that visit.  She called back to October 20 wanting to try valproate because of racing thoughts and difficulty sleeping.  Stating the trazodone was not working.  She was told it was okay to start Depakote ER 500 mg nightly and then gradually increased to 1500 mg nightly.  She called back again on October 6 and this physician noted the following: RTC  Pt called to report new med she started having a lot of side effects mood swings, sounds in ears, suicidal thoughts, tremors. Causing issues with husband She is currently taking Depakote ER 1500 mg nightly. She commits to safety.  The only symptom directly  attributable to Depakote would be the tremor.  However its presence would indicate she is not going to be able to tolerate a higher dose of Depakote to control her mood symptoms.  Therefore we will wean the Depakote.  In order to control the mood symptoms we will increase the Equetro at the evening dose which should not cause significant side effects problems during the day but help with mood stability.  This increase should also make it easier to wean the Depakote quickly.  She agrees to this plan.  She will call back if the suicidal thoughts become too intense to tolerate.  Reduce Depakote ER 500 mg tablets to 1 a night for 4 nights then stop Increase Equetro 200 mg capsules to 2 in the morning and 3 nightly.  Cristina Staggers, MD, DFAPA  She's been back on Equetro and doing OK. 2 weeks leading to Xmas with a lot of anger and lashing out a good bit.  Had run out of mirapex and Latuda for a couple of days in that time frame.  Crying is less now.  No SE CBZ now.  A lot of migraines off her Trokendi bc risk kidney stones.  Plans Botox for HA.  Usually sleeping OK without trazodone.   Still taking Latuda, buspirone, gabapentin, lamotrigine 300 mg BID.    seen 12.30.21 without med change.    12/17/19 appt with the following noted: Since then increased gabapentin for anxiety to 600 BID-TID which helped but  caused some tiredness.  Needing trazodone more bc EMA/EFA.  She's stopped caffeine.  Mood stability is getting better despite a lot of stress. $ stress and racing thoughts at night and scattered some with stressors.  Depression is mild lately.  Took trip with H last week helped.  Coming up on 2 year anniversary of B's death.  B diabetic died last year with DM and kidney failure. Not as much anger irritability.  Some mood swings early FEB but better now. Custody battle with exH.  He's nicer to her now.   Weight going up. Hates weight gain effects of meds.    Consistent with meds.  No sig SI other than  fleeting rarely.  Scattered concentration and racing thoughts randomly.   Sleep OK. Plan: No med changes/ FU 4 mos  06/18/20 TC saying she stopped all psych meds end of August.  06/22/20 appt with the following noted: Had gradually dropped off meds and not sure why.  Stoped Latuda, buspirone, CBZ. Pramipexole., and lamotrigine.  Sleep Ok without trazodone.  Gabapentin less tolerated as prn. Stopped Equetro over a month ago DT insurance.  Back on regular medicare and it might be covered. A lot of stress here lately. Lost father 01/01/20 of MI.  since here and called got propranolol and it hleped crying.  Also got some clonazepam.   Had given up on life and everything.  Not SI today but has been.  No plan.  Depression and mood swings including anger.  Anxiety. She and therapist say she's depressed and recommended IOP at [facility]. Agrees to retry Seroquel which helped in the past Plan: Acute decompensation because of psychiatric noncompliance. quetiapine ER 1 at night for 4 nights, then 2 at night for 4 nights, then 3 at night.  07/13/2020 appointment with the following noted: Patient called in between appointments complaining of quetiapine call causing restless legs.  Gabapentin was called in with instructions to make this side effect resolve. Misunderstood and only taking 300 mg Seroquel XR for a couple of days. Referred to [facility] and has continued therapy. Feels better on Seroquel.  No longer SI.  More stable.  Less anger.  Depression down to mild generally.  Fewer panic attacks. Sleeps with Seroquel 8 hours.  RLS is managed fairly well at this time. Initially too tired with Seroquel and better now. Clonazepam once in 2 weeks for panic driving. Referred to IOP and continued Seroquel ER 600 HS  08/10/20 appt with the following noted: Insurance wouldn't cover enough of IOP.  Still seeing therapist every 2 weeks. Consistent with Seroquel XR 600 daily and needs gabapentin with it DT RLS. No  longer taking propranolol. Taking clonazepam only twice in last month. I feel pretty stable right now.  First Xmas without father.  Feels better than mos ago.  More stable.  Sleep good.  Hangover if late with Seroquel.  Not unusually angry, under control.  Depression is better.  Occ down day.  Function is pretty good with routine things.   Energy is not great esp in the AM Gabapentin will make her feel too drunk if dose is too high. SE Seroquel makes her hungry. Plan: no med changes  09/09/2020 appointment with the following noted: Gabapentin manages the RLS. Compliant with Seroquel XR 600 mg HS. Kind of stressed out and sort of moody.  Mild mood swings with some on edge and agitated easily.  No SI. Kind of down over weight management visit and lack of progress.  No  energy to want to do anything.  Sleep is fine with Seroquel.  In bed at least 8 hours daily. Used Klonopin twice since last visit.  Triggered panic attack last week. Back on Reglan for 2 mos. Plan: Prescription given with schedule to increase to the target dose of Wellbutrin SR 100 mg tablets 2 twice daily and topiramate 25 mg tablets 2 twice daily both to help with weight management and energy and mild depression.  12/16/2020 phone call wanting to stop Seroquel.  02/10/2021 appointment with the following noted: Missed appt here.  She stopped Seroquel on her own gradually and completely about a month ago.  Was taking it off and on.  The gabapentin was interfering with her ability to function.  Wanting to lay down.  But had to take the gabapentin bc Seroquel giving her RLS.  Still very moody but not over the edge like she was in the past. Clonazepam once or twice weekly.  Stopped gabapentin bc no longer has RLS off the Seroquel. No trouble sleeping usually. Feels agitated but not manic.  A lot of stress at home. Lives in camper beside in-laws with conflict over $ with them.  A lot of stress right now and hurt and anger with them over  things. Not many friends to talk to and not a lot of conversation with H.  Feels like needs to restart therapy. Doing video therapy with Christina Hussami. Wellbutrin seemed to help mental state some but didn't help weight much. Plan: Consider retrying Vraylar bc weight gain.  Yes Start 1.5 mg for 1 week then 3 mg daily.  04/14/2021 appointment with the following noted: Seems to be working good with Vraylar 1.5 mg and stopped Wellbutrin. No SE. She started phentermine from Dr. Earlene Plater with Topomax. No SE mania and no sig benefit at 1.2 tablet.  No SE. Sleep normal and not manic. Depression is better with Vraylar but down a couple of times weekly and still a lot of stress at home which affects mood. Mo in law is source of stress bc lives in camper on her property.  She hates me. Won't allow her to use washer.  She's the reason pt needs clonazepam. $ stress.  Ran out of food stamps.  All 5 kids under their care for the summer.  She cooks for M in Social worker but won't help with cost. Disc D's psych provblems Plan no med changes  09/15/2021 appt noted:   Rougher time last few mos. Still seeing therapist. Hand surgery October and got depressed and gained weight and stopped meds for a week or 2.  Couldn't do anything after the surgery for awhile.  Didn't feel family was helping.  Worry over $.  H not working much and she's on disability.  Still living in camper which is falling apart. Has talked to H about helping and he's looking for a job.  Stress is usual cause of her depression and anxiety. Occ panic attacks.  Some isolation lately. ON Vraylar 3 mg daily.  No SE. Meds covered well DT MCR and MCD. M says she can tell when pt misses meds bc gets more emotional and posts on Facebook change.  03/29/22 appt noted: Mostly compliant with Vraylar.  No SE. Stressed last couple of weeks and a little more depressed and irritable. Small things set me off.  Feels depression is a factor and more tearful.  Stress  between she and H and housing problems.  Living in camper for 2 years.  Failed  attempt to move. Taking clonazepam 0.5 mg 3-4 times per week.   Therapist Plan: Therefore increase Vraylar to 4.5 mg daily  06/22/2022 appointment noted: Current psych meds are Vraylar 4.5 mg daily, clonazepam 0.5 mg 3 times daily as needed anxiety Clonazepam now only a couple of times per month.   Did move into her own place for a couple of weeks. Kids are really happy alos. Did fine with increase Vraylar and feels it really helped.  Mood good and more stable. No SE. She asked about ADD bc forgets a lot and easily distracted and poor memory at times. For example when cleaning will tend to jump from one task to another.  Always tends to have racing thoughts.  Always tended to want to keep doing things.  Had these sx for years as long as can remember.  Hard to watch TV show bc always feels she has to be doing something. Stayed on task better and better mood with phentermine including less irritability.  Took up to 37.5 mg daily. Still too irritable with kids. Sleep is good usually bc tired when kids go to bed.  DM can interfere with sleep bc nocturia.  Total 6-8 hours of sleep.   Still struggling with weight.   Forgets things kids and H tell her.   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 Earlene Plater and tried vyvanse but out of stock. Anxiety is better than it was.  Still kind of moody a lot and forgetful a lot.  Kids ask her to do things but she forgets. Still on Vryalar 4.5 mg daily wihtout crying or sadness. No Se Plan: For irritability increase Vraylar to 6 mg daily Vyvanse RX  01/03/23 appt noted: Dr. Earlene Plater got her increased to Vyvanse 60 mg AM.   Was out of Vraylar for a couple of weeks DT [facility] problems but back on for 5 days. Not irritable much anymore but is solemn some.  While out of it more depressed. Don't feel a lot of joy in her life since she was  here.   Not needing to take clonazepam bc anxiety is better.   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 He hasn't gotten paid yet so $ stress. Plans to seek marital therapy with pastor. Asks about something to lift her mood. Plan: flat so reduce Vraylar to 4.5  03/27/23 appt noted: Last filled Vyvanse 60 01/04/23.  Adderall XR 20 on 02/2023 by Dr. Earlene Plater bc insurance Uses clonazepam 0.5 mg about once per week.  More anxiety over the last month. Some worry and panic.  Last week vacation.  Anxiety thinking of random things like being too high in hotel.  Occ panic weekly affecting chest and SOB.  Both triggered and spontaneous. Reduced Vraylar to 4.5 and still feels flat.  Not a lot of joy in her life without change since last visist. Don't smile much.   Compliant.  More sad lately. Kids just got out of school and a lot of changes next year.   Worry over kids and D dep. Stress thinking of leaving H.  Cries a lot. Plan: Continue Vraylar to 4.5 mg daily For dep and panic resume trial Zoloft 50 mg daily for 2 weeks then 100 mg daily for panic and dep off label.  05/28/23 appt noted: Psych med: Vraylar 4.5, sertraline 100, switched from Vyvanse to Adderall XR 20 AM by Dr.  Wallace Less clonazepam with Zoloft helping.  Better mood and less anxiety with sertraline 100 mg daily.   No sig SE with it.  Still some agitation but not as much. Still happy with Vraylar 4.5 mg less flat than with 6 mg daily.  No sig crying over the last month.  Panic is better but not gone.  Can be spontaneous.    Can occur when overwhelmed and stressed.   Needs to resume therapy Christina at [facility]. Stepson 18 is living with them and starting college and not well prepared for this.  He doesn't have license yet.   Gets drop off in the afternoon with switch to Adderall XR 20 mg AM.   08/28/23 appt noted: Psych hosp since here 07/08/23.  Had tried to talk to mother and husband.  Lost her  therapist.  Clovis Cao to see someone now.  Was feeling dep and like no friends.  Family didn't seem to believe her or take her seriously.  Then argument with H and pushed over the edge and took OD clonazepam #5.  Intention was suicide. Psych in hosp reduced Vraylar to 3 mg and reduced sertraline to 50 mg daily.   Feels less on edge with 50 vs 100 mg Zoloft. 3rd H is jealous of exH who has her D on the weekends.  He got upset she was talking to [facility].  A lot of arguements with current H , Wilber Oliphant are about his jealousy.   Caleb's son 55 yo living with them and it is stressful. Things are better at home than they were.    Was highly stressed which flares bipolar sx.  Therapist will help.   Don't feel as depressed since out of the hospital.  Visited a friend helped.   Current psych meds:  Vraylar 3, sertraline 50 mg daily.  Dr. Earlene Plater Rx Adderall XR 20 AM is helping focus and appetite. No SE Really scatter brained without Adderall.   Prefers Vyvanse but insurance covers poorly. Sleep good .  Sometimes doesn't want to get OOB.   Still has a lot of anxiety and gets worked up too much.  Irritable not anxious . Not flat. No panic.   New therapist next week, Skylar Rheinhart, [place].  Remarried April 2020.  38 yo D & 38 yo D ADD .  One of the kids father has ADHD Disability review approved mostly for psych reasons.  Going to [facility] weight management and just started vitamin D. Level 24.5.  Past Psychiatric Medication Trials:  lamotrigine 300 twice daily, Equetro 900 daily,  Depakote 1500 SE,  History topiramate for migraine Vraylar 6 flat,  Abilify 15,  Seroquel worked but stopped DT insurance and SE RLS and hunger,  Latuda 120, lithium felt slowed down,   Sertraline 100 benefit  with Vraylar,  duloxetine,  Wellbutrin 300 min effect gabapentin 600 mg 3 times daily tiredness,  buspirone 30 twice daily,,  clonazepam, trazodone, Xanax , hydroxyzine NR, Hx phentermine seemed to help energy and mood.   Didn't help wt loss.  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  Cardiovascular:  Negative for palpitations.  Gastrointestinal:  Negative for nausea and vomiting.  Musculoskeletal:  Positive for arthralgias.       Recent hand surgery healing  Psychiatric/Behavioral:  Positive  for depression. Negative for agitation, behavioral problems and sleep disturbance. The patient is nervous/anxious.     Medications: I have reviewed the patient's current medications.  Current Outpatient Medications  Medication Sig Dispense Refill   albuterol (PROVENTIL) (2.5 MG/3ML) 0.083% nebulizer solution Take 3 mLs (2.5 mg total) by nebulization every 6 (six) hours as needed for wheezing or shortness of breath. 150 mL 1   albuterol (VENTOLIN HFA) 108 (90 Base) MCG/ACT inhaler TAKE 2 PUFFS BY MOUTH EVERY 6 HOURS AS NEEDED FOR WHEEZE OR SHORTNESS OF BREATH 18 each 2   amphetamine-dextroamphetamine (ADDERALL XR) 20 MG 24 hr capsule Take 20 mg by mouth 2 (two) times daily.     aspirin 81 MG chewable tablet Chew 1 tablet (81 mg total) by mouth at bedtime.     Budeson-Glycopyrrol-Formoterol (BREZTRI AEROSPHERE) 160-9-4.8 MCG/ACT AERO Inhale 2 puffs into the lungs 2 (two) times daily.     FASENRA 30 MG/ML SOSY Inject into the skin every 3 (three) months.     GLUCAGON EMERGENCY 1 MG injection Inject 1 mg into the skin as needed (hypoglycemia.).     glucose blood (ACCU-CHEK GUIDE) test strip Use to check blood sugar 3 time a day 300 strip 4   insulin lispro (HUMALOG) 100 UNIT/ML injection Inject up to 150 units daily according to insulin pump settings. 10 mL 0   levonorgestrel (MIRENA, 52 MG,) 20 MCG/24HR IUD 1 each by Intrauterine route once.     losartan (COZAAR) 25 MG tablet Take 25 mg by  mouth daily.     meloxicam (MOBIC) 15 MG tablet Take 1 tablet (15 mg total) by mouth daily. 30 tablet 0   montelukast (SINGULAIR) 10 MG tablet Take 1 tablet (10 mg total) by mouth at bedtime. 90 tablet 1   tirzepatide (MOUNJARO) 5 MG/0.5ML Pen Inject 5 mg into the skin once a week. (Patient taking differently: Inject 7.5 mg into the skin once a week.) 6 mL 1   Ubrogepant (UBRELVY) 100 MG TABS Take 1 tablet (100 mg total) by mouth daily as needed (migraine headaches.). 30 tablet 3   Cariprazine HCl (VRAYLAR) 4.5 MG CAPS Take 1 capsule (4.5 mg total) by mouth daily. 90 capsule 0   pravastatin (PRAVACHOL) 20 MG tablet Take 1 tablet (20 mg total) by mouth at bedtime. 30 tablet 0   sertraline (ZOLOFT) 50 MG tablet Take 1 tablet (50 mg total) by mouth daily. 90 tablet 0   No current facility-administered medications for this visit.    Medication Side Effects: tired in the afternoon.  Allergies:  Allergies  Allergen Reactions   Nsaids Other (See Comments)    Due to Renal disease   Other    Toradol [Ketorolac Tromethamine]     Cannot take d/t kidney issues    Ceftin Rash    Past Medical History:  Diagnosis Date   Anemia    Anemia of chronic disease 06/07/2012   Formatting of this note might be different from the original. Hematology consultation 07-13-16 (see note)--> Anemia of chronic disease Required 3 transfusions during pregnancy  Last Assessment & Plan:  Formatting of this note might be different from the original. She has now required 3 blood transfusions this pregnancy for her anemia of chronic disease. The most recent was one week ago when she bec   Anxiety    Asthma    Asthma 11/10/2013   Back pain    Bipolar 1 disorder (HCC)    Chest pain    Chronic renal failure  syndrome, stage 3 (moderate) (HCC)    Colles' fracture of left radius 10/11/2013   Complication of anesthesia    Constipation    Depression    Depression    Diabetic gastroparesis (HCC)    Diabetic neuropathy, type I  diabetes mellitus (HCC)    Diabetic ulcer of right great toe (HCC) 08/27/2020   Edema, lower extremity    Gallbladder disease    Gastroparesis    GERD (gastroesophageal reflux disease)    Headache(784.0)    Hypertension    Joint pain    Kidney stones    Palpitations    Polyneuropathy in diabetes(357.2)    PONV (postoperative nausea and vomiting)    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)    S/P carpal tunnel release left 10/16/19 11/04/2019   Shortness of breath    Tachycardia    baseline tachycardia    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)     Family History  Problem Relation Age of Onset   Diabetes Mother        type 2   Hypertension Mother    Obesity Mother    Diabetes Father    Hypertension Father    High Cholesterol Father    Sleep apnea Father    Obesity Father    Heart disease Father    Heart attack Father    Diabetes Brother        type 1   Renal Disease Brother        renal failure   Hypertension Brother    Hypertension Maternal Grandmother    Breast cancer Maternal Aunt 64   Colon cancer Maternal Aunt    Uterine cancer Maternal Aunt 65   Breast cancer Other    Rectal cancer Neg Hx    Esophageal cancer Neg Hx     Social History   Socioeconomic History   Marital status: Married    Spouse name: Not on file   Number of children: 2   Years of education: 12   Highest education level: 12th grade  Occupational History   Occupation: Arts development officer  Tobacco Use   Smoking status: Never   Smokeless tobacco: Never  Vaping Use   Vaping status: Never Used  Substance and Sexual Activity   Alcohol use: No   Drug use: No   Sexual activity: Yes    Partners: Male    Birth control/protection: I.U.D.    Comment: Mirena  Other Topics Concern   Not on file  Social History Narrative   Pt has a daughter and boyfriend.    Dad passed away suddenly heart attack 12/2019   Social Determinants of Health   Financial Resource  Strain: Medium Risk (12/07/2022)   Overall Financial Resource Strain (CARDIA)    Difficulty of Paying Living Expenses: Somewhat hard  Food Insecurity: Food Insecurity Present (07/09/2023)   Hunger Vital Sign    Worried About Running Out of Food in the Last Year: Sometimes true    Ran Out of Food in the Last Year: Sometimes true  Transportation Needs: No Transportation Needs (07/09/2023)   PRAPARE - Administrator, Civil Service (Medical): No    Lack of Transportation (Non-Medical): No  Physical Activity: Insufficiently Active (12/07/2022)   Exercise Vital Sign    Days of Exercise per Week: 2 days    Minutes of Exercise per Session: 30 min  Stress: No Stress Concern Present (12/07/2022)   [employer] of Occupational Health -  Occupational Stress Questionnaire    Feeling of Stress : Only a little  Social Connections: Moderately Integrated (12/07/2022)   Social Connection and Isolation Panel [NHANES]    Frequency of Communication with Friends and Family: More than three times a week    Frequency of Social Gatherings with Friends and Family: Once a week    Attends Religious Services: More than 4 times per year    Active Member of [employer] or Organizations: No    Attends Banker Meetings: Never    Marital Status: Married  Catering manager Violence: Not At Risk (07/09/2023)   Humiliation, Afraid, Rape, and Kick questionnaire    Fear of Current or Ex-Partner: No    Emotionally Abused: No    Physically Abused: No    Sexually Abused: No    Past Medical History, Surgical history, Social history, and Family history were reviewed and updated as appropriate.   Please see review of systems for further details on the patient's review from today.   Objective:   Physical Exam:  There were no vitals taken for this visit.  Physical Exam Constitutional:      General: She is not in acute distress. Musculoskeletal:        General: No deformity.  Neurological:     Mental  Status: She is alert and oriented to person, place, and time.     Cranial Nerves: No dysarthria.     Coordination: Coordination normal.  Psychiatric:        Attention and Perception: Attention and perception normal. She does not perceive auditory or visual hallucinations.        Mood and Affect: Mood is anxious. Mood is not depressed. Affect is not labile, blunt, angry, tearful or inappropriate.        Speech: Speech normal.        Behavior: Behavior normal. Behavior is cooperative.        Thought Content: Thought content normal. Thought content is not paranoid or delusional. Thought content does not include homicidal or suicidal ideation. Thought content does not include suicidal plan.        Cognition and Memory: Cognition and memory normal.        Judgment: Judgment normal.     Comments: Insight intact Not sig Irritable  depressed but feels flat     Lab Review:     Component Value Date/Time   NA 132 (L) 07/08/2023 0917   NA 137 04/04/2021 1250   K 4.0 07/08/2023 0917   CL 97 (L) 07/08/2023 0917   CO2 29 07/08/2023 0917   GLUCOSE 291 (H) 07/08/2023 0917   BUN 22 (H) 07/08/2023 0917   BUN 18 04/04/2021 1250   CREATININE 0.95 07/08/2023 0917   CREATININE 1.16 (H) 10/19/2022 1457   CALCIUM 8.3 (L) 07/08/2023 0917   PROT 6.4 (L) 07/08/2023 0434   PROT 7.1 04/04/2021 1250   ALBUMIN 3.5 07/08/2023 0434   ALBUMIN 4.5 04/04/2021 1250   AST 18 07/08/2023 0434   ALT 18 07/08/2023 0434   ALKPHOS 108 07/08/2023 0434   BILITOT 0.5 07/08/2023 0434   BILITOT 0.4 04/04/2021 1250   GFRNONAA >60 07/08/2023 0917   GFRNONAA 36 (L) 06/17/2018 1231   GFRAA 50 (L) 07/29/2020 1246   GFRAA 42 (L) 06/17/2018 1231       Component Value Date/Time   WBC 9.6 07/08/2023 0441   RBC 5.17 (H) 07/08/2023 0441   HGB 14.7 07/08/2023 0441   HGB 15.2 04/04/2021 1250   HCT  44.7 07/08/2023 0441   HCT 45.8 04/04/2021 1250   PLT 317 07/08/2023 0441   PLT 287 04/04/2021 1250   MCV 86.5 07/08/2023 0441    MCV 87 04/04/2021 1250   MCH 28.4 07/08/2023 0441   MCHC 32.9 07/08/2023 0441   RDW 12.7 07/08/2023 0441   RDW 12.4 04/04/2021 1250   LYMPHSABS 2.5 07/08/2023 0441   LYMPHSABS 2.4 04/04/2021 1250   MONOABS 0.9 07/08/2023 0441   EOSABS 0.2 07/08/2023 0441   EOSABS 0.3 04/04/2021 1250   BASOSABS 0.1 07/08/2023 0441   BASOSABS 0.1 04/04/2021 1250    No results found for: "POCLITH", "LITHIUM"   No results found for: "PHENYTOIN", "PHENOBARB", "VALPROATE", "CBMZ"   .res Assessment: Plan:    Nicolle was seen today for follow-up, depression, anxiety and stress.  Diagnoses and all orders for this visit:  Bipolar 1 disorder, mixed, moderate (HCC) -     Cariprazine HCl (VRAYLAR) 4.5 MG CAPS; Take 1 capsule (4.5 mg total) by mouth daily.  Generalized anxiety disorder -     sertraline (ZOLOFT) 50 MG tablet; Take 1 tablet (50 mg total) by mouth daily.  Attention deficit hyperactivity disorder (ADHD), combined type  Panic disorder with agoraphobia -     sertraline (ZOLOFT) 50 MG tablet; Take 1 tablet (50 mg total) by mouth daily.  Insomnia due to mental condition  Binge-eating disorder, moderate  Restless leg syndrome, controlled      30 min non face to face time with patient was spent on counseling and coordination of care. We discussed Patient with a history of multiple med failures and history of psychiatric hospitalization and suicide attempts recent worsening of bipolar depression.  Chronic stressors contribute to mood and anxiety problems.   Concern about potential weight gain with medications.  She has taken the major mood stabilizers with low weight gain potential. Discussed the pros and cons of switching mood stabilizers.  Could consider ultra low-dose lithium.     Ongoing chronic stressful situation living in a camper with her children and husband in a very small space.  Discussed what realistically meds can help and what they cannot help.   DT irritability increase back  to  Vraylar to 4.5 mg daily  Continue Zoloft 50 mg daily. Disc risk mania.   Disc poss of ADD but risk ADD meds causing mood swings.  She reports hx mood benefit with phentermine but minimal wt loss.   ADD scale today  I'm not comfortable using short acting stimulants in this pt.  Shortage of Vyvanse at this time but consider.  This could help binge eating and cognitive complaints.    Still seeing Dr. Earlene Plater for wt loss.    OK trial of Vyvanse for BED and ADD.  Will try to get it again. ADHD SRS : inattentive 17, hyperactive 32= total 49, high Rx Adderall from Dr. Earlene Plater bc insurance won't cover Vyvanse.  Discussed potential metabolic side effects associated with atypical antipsychotics, as well as potential risk for movement side effects. Advised pt to contact office if movement side effects occur.    RLS Not a problem now.  No med changes today.  Disc the large bill here and not making payments.  She has made a payment since her.  Follow-up 2 months  Cristina Staggers, MD, DFAPA   Please see After Visit Summary for patient specific instructions.  Future Appointments  Date Time Provider Department Center  10/01/2023 10:00 AM Croitoru, Rachelle Hora, MD [facility] None  11/16/2023 10:45 AM Ardelle Anton,  Lesia Sago, DPM [facility]    No orders of the defined types were placed in this encounter.   -------------------------------

## 2023-09-29 NOTE — Progress Notes (Unsigned)
Cardiology Office Note:  .   Date:  10/04/2023  ID:  Joanette Gula, DOB 04-29-1986, MRN [id number] PCP: Donita Brooks, MD  [facility] Providers Cardiologist:  Kailin Leu  History of Present Illness: .   [name] is a 37 y.o. female with a history of early onset and nonobstructive CAD on a background of type 1 diabetes mellitus on insulin since age 81 years.  She has a history of preeclampsia with both of her previous pregnancies.  She subsequent developed hypertension, but this has resolved after weight loss.  She also has a history of migraines and asthma. In early 2022, she "failed a stress test" (she developed chest pain while walking on the treadmill) and then underwent cardiac catheterization which showed atherosclerotic plaque with no more than about 20% stenosis in the mid LAD artery.  She has a history of bipolar disorder.  Per chart reports a suicide attempt in September 2024, with a brief hospitalization at [facility].  PET scan performed in August 2024 showed no evidence of ischemia and excellent myocardial blood flow reserve of 2.81 and normal EF at rest of 60%.  Mild coronary calcifications were present in the LAD and RCA on the CT images.  The aorta was normal in caliber.  Last month, for a couple of days she had some problems with headaches and some right-sided chest pain that was clearly worse when she would try to take a deep breath.  It is resolved spontaneously.  She had pulmonary function test that confirmed the diagnosis of asthma.  She noticed that after those PFTs her heart rate was very fast at 140 (she received twice a dose of bronchodilator she would normally take).  She continues to have a relatively high resting heart rate, typically in the 90s.  She does not really complain of exertional dyspnea and she has not had edema, dizziness, palpitations, falls.  She is felt to have diabetes mellitus "type 1.5". She has an insulin pump and a glucose  monitoring device.   and has started treatment with Mounjaro.  This has led to improvement in glycemic control, which remains very much suboptimal (hemoglobin A1c 10.8% despite the improvement).  She has lost some weight. Her endocrinologist is Dr. Sharl Ma.  Her lipid profile on the other hand is excellent with an HDL of 66 and LDL of 47 and normal triglycerides.  She is taking pravastatin 20 mg daily.  Her most recent creatinine was excellent at 0.95.  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 Vincente Poli and sees an ophthalmologist at [facility] for diabetic retinopathy.  Also bears a diagnosis of diabetic gastroparesis.  Her pulmonologist is Dr. Jerre Simon with [facility].  She has taken numerous antihypertensive medications in the past, especially when she weighed more.  She was started on carvedilol but had to stop it after she lost weight because she had symptomatic hypotension.  She is on a 25 mg dose of losartan for renal protection from diabetes mellitus.  She remembers taking amlodipine in the past without side effects.  She has never smoked cigarettes.    Her brother also had type 1 diabetes mellitus.  They were diagnosed within 2 weeks of each other.  Unfortunately her brother developed end-stage renal disease after CT performed with contrast and went on dialysis.  He died after about 2 years on hemodialysis.  Patient's father had a myocardial infarction and died suddenly at age 76.  Her mother has hypertension.    Studies Reviewed: Marland Kitchen   EKG Interpretation Date/Time:  Monday October 01 2023 10:07:08 EST Ventricular Rate:  96 PR Interval:  140 QRS Duration:  74 QT Interval:  350 QTC Calculation: 442 R Axis:   37  Text Interpretation: Normal sinus rhythm Normal ECG When compared with ECG of 08-Jul-2023 03:14, PREVIOUS ECG IS PRESENT Confirmed by Kippy Gohman [phone]) on 10/01/2023 10:33:31 AM   Cardiac PET scan 05/29/2023    The study  is normal. The study is low risk.   LV perfusion is normal. There is no evidence of ischemia. There is no evidence of infarction.   Rest left ventricular function is normal. Rest EF: 60%. Stress left ventricular function is normal. Stress EF: 67%. End diastolic cavity size is normal. End systolic cavity size is normal.   Myocardial blood flow was computed to be 1.39ml/g/min at rest and 3.69ml/g/min at stress. Global myocardial blood flow reserve was 2.81 and was normal.   Coronary calcium was present on the attenuation correction CT images. Mild coronary calcifications were present. Coronary calcifications were present in the left anterior descending artery and right coronary artery distribution(s).  Echocardiogram 10/13/2021 [facility]  Left Ventricle: Systolic function is normal. EF: 55-60%.    Left Ventricle: Doppler parameters consistent with mild diastolic  dysfunction and low to normal LA pressure.   Normal LVEF  Mild diastolic dysfunction  No valvular pathologies   Cardiac catheterization 11/17/2020  Coronary Angiography  1. Left Main -normal  2. Left anterior descending artery -there is a 10 to 20% mid LAD stenosis.   There is 1 large diagonal artery which is widely patent  3. Left Circumflex -normal.  There is 1 moderate sized obtuse marginal  artery that bifurcates that is widely patent  4. Right Coronary Artery -normal.  The PDA and PLV branches are normal  Additional comments on angiography: Right dominant       Latest Ref Rng & Units 07/08/2023    9:17 AM 07/08/2023    4:34 AM 10/19/2022    2:57 PM  BMP  Glucose 70 - 99 mg/dL 604  540  981   BUN 6 - 20 mg/dL 22  25  15    Creatinine 0.44 - 1.00 mg/dL 1.91  4.78  2.95   BUN/Creat Ratio 6 - 22 (calc)   13   Sodium 135 - 145 mmol/L 132  128  137   Potassium 3.5 - 5.1 mmol/L 4.0  4.5  5.0   Chloride 98 - 111 mmol/L 97  95  102   CO2 22 - 32 mmol/L 29  22  25    Calcium 8.9 - 10.3 mg/dL 8.3  9.0  9.4     Lipid Panel      Component Value Date/Time   CHOL 127 11/02/2022 0836   CHOL 200 (H) 04/04/2021 1250   TRIG 56 11/02/2022 0836   HDL 66 11/02/2022 0836   HDL 102 04/04/2021 1250   CHOLHDL 1.9 11/02/2022 0836   VLDL 10 04/22/2014 0827   LDLCALC 47 11/02/2022 0836   LABVLDL 7 04/04/2021 1250     Risk Assessment/Calculations:          Physical Exam:   VS:  BP 136/84   Pulse 96   Ht 5\' 6"  (1.676 m)   Wt 178 lb 12.8 oz (81.1 kg)   SpO2 96%   BMI 28.86 kg/m    Wt Readings from Last 3 Encounters:  10/01/23 178 lb 12.8 oz (  81.1 kg)  08/21/23 153 lb (69.4 kg)  07/19/23 153 lb (69.4 kg)    GEN: Overweight, well developed in no acute distress NECK: No JVD; No carotid bruits CARDIAC: RRR, no murmurs, rubs, gallops RESPIRATORY:  Clear to auscultation without rales, wheezing or rhonchi  ABDOMEN: Soft, non-tender, non-distended EXTREMITIES:  No edema; No deformity   ASSESSMENT AND PLAN: .    CAD: She has age -advanced atherosclerosis likely related to type 1 diabetes mellitus, but had angiography performed in early 2022, without obstructive lesions and had excellent perfusion on PET scan performed in August 2024. Question possible coronary vasospasm or endothelial dysfunction in the setting of type 1 diabetes mellitus for over 30 years.  She had some chest discomfort in the last few weeks that sounded distinctly pleuritic, may have been an episode of viral pleurisy or pericarditis.  It did not sound anginal.  If symptoms truly consistent with angina do occur, we have to consider that she could have coronary vasospasm or endothelial dysfunction (however note normal myocardial blood flow reserve on recent PET scan).  May not do well with nitrates due to her tendency to have migraines.  We could try a low-dose of amlodipine in case she has coronary vasospasm.  It is reasonable to continue low-dose aspirin. HLP: Excellent lipid profile I will continue the current low-dose of pravastatin. DM: Complicated by  retinopathy and nephropathy/CKD, but with improving hemoglobin A1c and markedly improved creatinine.  Clear benefit due to the addition of Mounjaro to her insulin even though she is a type I diabetic.  She has lost substantial weight. HTN: Started off with preeclampsia during pregnancies.  Her blood pressure has normalized with weight loss.  She is only on a small dose of losartan prescribed for nephro protection. Avoid NSAIDs and other nephrotoxic medications.  Shee is very sensitive to the issue of worsening kidney function in view of her brothers history.   Patient Instructions  Medication Instructions:  No changes *If you need a refill on your cardiac medications before your next appointment, please call your pharmacy*  Follow-Up: At [facility], you and your health needs are our priority.  As part of our continuing mission to provide you with exceptional heart care, we have created designated Provider Care Teams.  These Care Teams include your primary Cardiologist (physician) and Advanced Practice Providers (APPs -  Physician Assistants and Nurse Practitioners) who all work together to provide you with the care you need, when you need it.  We recommend signing up for the patient portal called "MyChart".  Sign up information is provided on this After Visit Summary.  MyChart is used to connect with patients for Virtual Visits (Telemedicine).  Patients are able to view lab/test results, encounter notes, upcoming appointments, etc.  Non-urgent messages can be sent to your provider as well.   To learn more about what you can do with MyChart, go to [email or web address].    Your next appointment:   1 year(s)  Provider:   Dr Royann Shivers         Signed, Thurmon Fair, MD

## 2023-10-01 ENCOUNTER — Ambulatory Visit: Payer: Medicare Other | Attending: Cardiovascular Disease | Admitting: Cardiovascular Disease

## 2023-10-01 VITALS — BP 136/84 | HR 96 | Ht 66.0 in | Wt 178.8 lb

## 2023-10-01 DIAGNOSIS — E108 Type 1 diabetes mellitus with unspecified complications: Secondary | ICD-10-CM | POA: Insufficient documentation

## 2023-10-01 DIAGNOSIS — I152 Hypertension secondary to endocrine disorders: Secondary | ICD-10-CM | POA: Insufficient documentation

## 2023-10-01 DIAGNOSIS — E7800 Pure hypercholesterolemia, unspecified: Secondary | ICD-10-CM | POA: Insufficient documentation

## 2023-10-01 DIAGNOSIS — N1832 Chronic kidney disease, stage 3b: Secondary | ICD-10-CM | POA: Insufficient documentation

## 2023-10-01 DIAGNOSIS — E1159 Type 2 diabetes mellitus with other circulatory complications: Secondary | ICD-10-CM | POA: Insufficient documentation

## 2023-10-01 DIAGNOSIS — I25118 Atherosclerotic heart disease of native coronary artery with other forms of angina pectoris: Secondary | ICD-10-CM | POA: Diagnosis not present

## 2023-10-01 NOTE — Patient Instructions (Signed)

## 2023-10-04 ENCOUNTER — Encounter: Payer: Self-pay | Admitting: Cardiovascular Disease

## 2023-11-06 ENCOUNTER — Ambulatory Visit (INDEPENDENT_AMBULATORY_CARE_PROVIDER_SITE_OTHER): Payer: Medicare Other | Admitting: Psychiatry

## 2023-11-13 ENCOUNTER — Telehealth: Payer: Self-pay | Admitting: Psychiatry

## 2023-11-13 ENCOUNTER — Other Ambulatory Visit: Payer: Self-pay

## 2023-11-13 ENCOUNTER — Other Ambulatory Visit: Payer: Self-pay | Admitting: Psychiatry

## 2023-11-13 MED ORDER — CLONIDINE HCL 0.1 MG PO TABS: 0.1000 mg | ORAL_TABLET | Freq: Two times a day (BID) | ORAL | 0 refills | Status: AC | PRN

## 2023-11-13 NOTE — Telephone Encounter (Signed)
[name] called at 2:05 to report that her anxiety is severe today and she is totally out of Klonopin.  Appt 3/5.  Send prescription to  [facility] [address] [place] - [address]

## 2023-11-13 NOTE — Telephone Encounter (Signed)
Will not RX clonazepam again bc she overdosed on it in suicide attempt.  Also we shoulc not combine BZ with stimulants and sh'e taking Adderall.  Will agree to clonidine 0.1 mg twice daily as needed for anxiety.  I will send in RX.

## 2023-11-13 NOTE — Telephone Encounter (Signed)
 She is requesting a rf of Clonazepam  0.5mg  however,   Lv note 11/19  took OD clonazepam  #5. Intention was suicide.   The note does not mention the dc of Clonazepam - but also on it is no longer on her med list. Is it ok to rf? The trend looks like 20 for 7 days? Lf 06/18   Please advise

## 2023-11-16 ENCOUNTER — Ambulatory Visit: Payer: Medicare Other | Admitting: Podiatry

## 2023-11-22 ENCOUNTER — Telehealth: Payer: Self-pay | Admitting: Family Medicine

## 2023-12-05 ENCOUNTER — Telehealth: Payer: Self-pay

## 2023-12-05 NOTE — Telephone Encounter (Signed)
 [name] (Key: [id number])  form thumbnail Your information has been submitted to [employer]. [employer] will review the request and will issue a decision, typically within 1-3 days from your submission. You can check the updated outcome later by reopening this request.

## 2023-12-12 ENCOUNTER — Ambulatory Visit: Payer: Medicare Other | Admitting: Psychiatry

## 2023-12-12 ENCOUNTER — Encounter: Payer: Self-pay | Admitting: Psychiatry

## 2023-12-12 DIAGNOSIS — F3162 Bipolar disorder, current episode mixed, moderate: Secondary | ICD-10-CM | POA: Diagnosis not present

## 2023-12-12 DIAGNOSIS — F902 Attention-deficit hyperactivity disorder, combined type: Secondary | ICD-10-CM | POA: Diagnosis not present

## 2023-12-12 DIAGNOSIS — F5105 Insomnia due to other mental disorder: Secondary | ICD-10-CM

## 2023-12-12 DIAGNOSIS — F411 Generalized anxiety disorder: Secondary | ICD-10-CM

## 2023-12-12 DIAGNOSIS — F4001 Agoraphobia with panic disorder: Secondary | ICD-10-CM

## 2023-12-12 DIAGNOSIS — G2581 Restless legs syndrome: Secondary | ICD-10-CM

## 2023-12-12 DIAGNOSIS — F50811 Binge eating disorder, moderate: Secondary | ICD-10-CM

## 2023-12-12 NOTE — Progress Notes (Signed)
 [name] [id number] [birth year] 38 y.o.    Subjective:   Patient ID:  [name] is a 38 y.o. (DOB June 19, 1986) female.  Chief Complaint:  Chief Complaint  Patient presents with   Follow-up   Depression   Anxiety   Sleeping Problem   [name] presents to the office today for follow-up of bipolar disorder and generalized anxiety disorder. Historically seen with [name].  When seen June 05, 2019.  The following changes were made: Change pramipexole ER and increase to 0.75 mg pm to rid nausea and improve mood in the afternoon.  For tiredness in the afternoon switch Equetro to 400 mg AM , 1@ 6 and 1 at 11. For panic in daytime start gabapentin 300 mg each am as preventative.  She's only taking it prn now. We continued unchanged Latuda 120 mg and lamotrigine 300 mg twice daily for bipolar disorder. She just got the ER pramipexole about a week ago.  Can't tell difference with nausea DT  gastroparesis worse lately with constipation Less afternoon tiredness with current split Equetro dose.    seen July 22, 2019.  Meds were not changed at that visit.  She called back to October 20 wanting to try valproate because of racing thoughts and difficulty sleeping.  Stating the trazodone was not working.  She was told it was okay to start Depakote ER 500 mg nightly and then gradually increased to 1500 mg nightly.  She called back again on October 6 and this physician noted the following: RTC  Pt called to report new med she started having a lot of side effects mood swings, sounds in ears, suicidal thoughts, tremors. Causing issues with husband She is currently taking Depakote ER 1500 mg nightly. She commits to safety.  The only symptom directly attributable to Depakote would be the tremor.  However its presence would indicate she is not going to be able to tolerate a higher dose of Depakote to control her mood symptoms.  Therefore we will wean the  Depakote.  In order to control the mood symptoms we will increase the Equetro at the evening dose which should not cause significant side effects problems during the day but help with mood stability.  This increase should also make it easier to wean the Depakote quickly.  She agrees to this plan.  She will call back if the suicidal thoughts become too intense to tolerate.  Reduce Depakote ER 500 mg tablets to 1 a night for 4 nights then stop Increase Equetro 200 mg capsules to 2 in the morning and 3 nightly.  Cristina Staggers, MD, DFAPA  She's been back on Equetro and doing OK. 2 weeks leading to Xmas with a lot of anger and lashing out a good bit.  Had run out of mirapex and Latuda for a couple of days in that time frame.  Crying is less now.  No SE CBZ now.  A lot of migraines off her Trokendi bc risk kidney stones.  Plans Botox for HA.  Usually sleeping OK without trazodone.   Still taking Latuda, buspirone, gabapentin, lamotrigine 300 mg BID.    seen 12.30.21 without med change.    12/17/19 appt with the following noted: Since then increased gabapentin for anxiety to 600 BID-TID which helped but caused some tiredness.  Needing trazodone more bc EMA/EFA.  She's stopped caffeine.  Mood stability is getting better despite a lot of stress. $ stress and racing thoughts at night and scattered some with stressors.  Depression is mild lately.  Took trip with [name] last week helped.  Coming up on 2 year anniversary of [name]'s death.  [name] diabetic died last year with DM and kidney failure. Not as much anger irritability.  Some mood swings early FEB but better now. Custody battle with exH.  He's nicer to her now.   Weight going up. Hates weight gain effects of meds.    Consistent with meds.  No sig SI other than fleeting rarely.  Scattered concentration and racing thoughts randomly.   Sleep OK. Plan: No med changes/ West 4 mos  06/18/20 TC saying she stopped all psych meds end of August.  06/22/20 appt with the following  noted: Had gradually dropped off meds and not sure why.  Stoped Latuda, buspirone, CBZ. Pramipexole., and lamotrigine.  Sleep Ok without trazodone.  Gabapentin less tolerated as prn. Stopped Equetro over a month ago DT insurance.  Back on regular medicare and it might be covered. A lot of stress here lately. Lost father 01/01/20 of MI.  since here and called got propranolol and it hleped crying.  Also got some clonazepam.   Had given up on life and everything.  Not SI today but has been.  No plan.  Depression and mood swings including anger.  Anxiety. She and therapist say she's depressed and recommended IOP at [facility]. Agrees to retry Seroquel which helped in the past Plan: Acute decompensation because of psychiatric noncompliance. quetiapine ER 1 at night for 4 nights, then 2 at night for 4 nights, then 3 at night.  07/13/2020 appointment with the following noted: Patient called in between appointments complaining of quetiapine call causing restless legs.  Gabapentin was called in with instructions to make this side effect resolve. Misunderstood and only taking 300 mg Seroquel XR for a couple of days. Referred to [facility] and has continued therapy. Feels better on Seroquel.  No longer SI.  More stable.  Less anger.  Depression down to mild generally.  Fewer panic attacks. Sleeps with Seroquel 8 hours.  RLS is managed fairly well at this time. Initially too tired with Seroquel and better now. Clonazepam once in 2 weeks for panic driving. Referred to IOP and continued Seroquel ER 600 HS  08/10/20 appt with the following noted: Insurance wouldn't cover enough of IOP.  Still seeing therapist every 2 weeks. Consistent with Seroquel XR 600 daily and needs gabapentin with it DT RLS. No longer taking propranolol. Taking clonazepam only twice in last month. I feel pretty stable right now.  First Xmas without father.  Feels better than mos ago.  More stable.  Sleep good.  Hangover if late with  Seroquel.  Not unusually angry, under control.  Depression is better.  Occ down day.  Function is pretty good with routine things.   Energy is not great esp in the AM Gabapentin will make her feel too drunk if dose is too high. SE Seroquel makes her hungry. Plan: no med changes  09/09/2020 appointment with the following noted: Gabapentin manages the RLS. Compliant with Seroquel XR 600 mg HS. Kind of stressed out and sort of moody.  Mild mood swings with some on edge and agitated easily.  No SI. Kind of down over weight management visit and lack of progress.  No energy to want to do anything.  Sleep is fine with Seroquel.  In bed at least 8 hours daily. Used Klonopin twice since last visit.  Triggered panic attack last week. Back on Reglan for 2  mos. Plan: Prescription given with schedule to increase to the target dose of Wellbutrin SR 100 mg tablets 2 twice daily and topiramate 25 mg tablets 2 twice daily both to help with weight management and energy and mild depression.  12/16/2020 phone call wanting to stop Seroquel.  02/10/2021 appointment with the following noted: Missed appt here.  She stopped Seroquel on her own gradually and completely about a month ago.  Was taking it off and on.  The gabapentin was interfering with her ability to function.  Wanting to lay down.  But had to take the gabapentin bc Seroquel giving her RLS.  Still very moody but not over the edge like she was in the past. Clonazepam once or twice weekly.  Stopped gabapentin bc no longer has RLS off the Seroquel. No trouble sleeping usually. Feels agitated but not manic.  A lot of stress at home. Lives in camper beside in-laws with conflict over $ with them.  A lot of stress right now and hurt and anger with them over things. Not many friends to talk to and not a lot of conversation with [name].  Feels like needs to restart therapy. Doing video therapy with Christina Hussami. Wellbutrin seemed to help mental state some but didn't  help weight much. Plan: Consider retrying Vraylar bc weight gain.  Yes Start 1.5 mg for 1 week then 3 mg daily.  04/14/2021 appointment with the following noted: Seems to be working good with Vraylar 1.5 mg and stopped Wellbutrin. No SE. She started phentermine from Dr. Earlene Plater with Topomax. No SE mania and no sig benefit at 1.2 tablet.  No SE. Sleep normal and not manic. Depression is better with Vraylar but down a couple of times weekly and still a lot of stress at home which affects mood. Mo in law is source of stress bc lives in camper on her property.  She hates me. Won't allow her to use washer.  She's the reason pt needs clonazepam. $ stress.  Ran out of food stamps.  All 5 kids under their care for the summer.  She cooks for M in Social worker but won't help with cost. Disc D's psych provblems Plan no med changes  09/15/2021 appt noted:   Rougher time last few mos. Still seeing therapist. Hand surgery October and got depressed and gained weight and stopped meds for a week or 2.  Couldn't do anything after the surgery for awhile.  Didn't feel family was helping.  Worry over $.  [name] not working much and she's on disability.  Still living in camper which is falling apart. Has talked to [name] about helping and he's looking for a job.  Stress is usual cause of her depression and anxiety. Occ panic attacks.  Some isolation lately. ON Vraylar 3 mg daily.  No SE. Meds covered well DT MCR and MCD. M says she can tell when pt misses meds bc gets more emotional and posts on Facebook change.  03/29/22 appt noted: Mostly compliant with Vraylar.  No SE. Stressed last couple of weeks and a little more depressed and irritable. Small things set me off.  Feels depression is a factor and more tearful.  Stress between she and [name] and housing problems.  Living in camper for 2 years.  Failed attempt to move. Taking clonazepam 0.5 mg 3-4 times per week.   Therapist Plan: Therefore increase Vraylar to 4.5 mg  daily  06/22/2022 appointment noted: Current psych meds are Vraylar 4.5 mg daily, clonazepam 0.5 mg  3 times daily as needed anxiety Clonazepam now only a couple of times per month.   Did move into her own place for a couple of weeks. Kids are really happy alos. Did fine with increase Vraylar and feels it really helped.  Mood good and more stable. No SE. She asked about ADD bc forgets a lot and easily distracted and poor memory at times. For example when cleaning will tend to jump from one task to another.  Always tends to have racing thoughts.  Always tended to want to keep doing things.  Had these sx for years as long as can remember.  Hard to watch TV show bc always feels she has to be doing something. Stayed on task better and better mood with phentermine including less irritability.  Took up to 37.5 mg daily. Still too irritable with kids. Sleep is good usually bc tired when kids go to bed.  DM can interfere with sleep bc nocturia.  Total 6-8 hours of sleep.   Still struggling with weight.   Forgets things kids and [name] tell her.   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 Earlene Plater and tried vyvanse but out of stock. Anxiety is better than it was.  Still kind of moody a lot and forgetful a lot.  Kids ask her to do things but she forgets. Still on Vryalar 4.5 mg daily wihtout crying or sadness. No Se Plan: For irritability increase Vraylar to 6 mg daily Vyvanse RX  01/03/23 appt noted: Dr. Earlene Plater got her increased to Vyvanse 60 mg AM.   Was out of Vraylar for a couple of weeks DT [facility] problems but back on for 5 days. Not irritable much anymore but is solemn some.  While out of it more depressed. Don't feel a lot of joy in her life since she was here.   Not needing to take clonazepam bc anxiety is better.   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 He hasn't gotten paid yet so $ stress. Plans to seek  marital therapy with pastor. Asks about something to lift her mood. Plan: flat so reduce Vraylar to 4.5  03/27/23 appt noted: Last filled Vyvanse 60 01/04/23.  Adderall XR 20 on 02/2023 by Dr. Earlene Plater bc insurance Uses clonazepam 0.5 mg about once per week.  More anxiety over the last month. Some worry and panic.  Last week vacation.  Anxiety thinking of random things like being too high in hotel.  Occ panic weekly affecting chest and SOB.  Both triggered and spontaneous. Reduced Vraylar to 4.5 and still feels flat.  Not a lot of joy in her life without change since last visist. Don't smile much.   Compliant.  More sad lately. Kids just got out of school and a lot of changes next year.   Worry over kids and D dep. Stress thinking of leaving [name].  Cries a lot. Plan: Continue Vraylar to 4.5 mg daily For dep and panic resume trial Zoloft 50 mg daily for 2 weeks then 100 mg daily for panic and dep off label.  05/28/23 appt noted: Psych med: Vraylar 4.5, sertraline 100, switched from Vyvanse to Adderall XR 20 AM by Dr. Earlene Plater Less clonazepam with Zoloft helping.  Better mood and less anxiety with sertraline 100 mg daily.   No sig SE with it.  Still some agitation but not as much. Still happy with Vraylar 4.5  mg less flat than with 6 mg daily.  No sig crying over the last month.  Panic is better but not gone.  Can be spontaneous.    Can occur when overwhelmed and stressed.   Needs to resume therapy Christina at [facility]. Stepson 18 is living with them and starting college and not well prepared for this.  He doesn't have license yet.   Gets drop off in the afternoon with switch to Adderall XR 20 mg AM.   08/28/23 appt noted: Psych hosp since here 07/08/23.  Had tried to talk to mother and husband.  Lost her therapist.  Clovis Cao to see someone now.  Was feeling dep and like no friends.  Family didn't seem to believe her or take her seriously.  Then argument with [name] and pushed over the edge and took OD  clonazepam #5.  Intention was suicide. Psych in hosp reduced Vraylar to 3 mg and reduced sertraline to 50 mg daily.   Feels less on edge with 50 vs 100 mg Zoloft. 3rd [name] is jealous of exH who has her D on the weekends.  He got upset she was talking to [facility].  A lot of arguements with current [name] , Wilber Oliphant are about his jealousy.   Caleb's son 62 yo living with them and it is stressful. Things are better at home than they were.    Was highly stressed which flares bipolar sx.  Therapist will help.   Don't feel as depressed since out of the hospital.  Visited a friend helped.   Current psych meds:  Vraylar 3, sertraline 50 mg daily.  Dr. Earlene Plater Rx Adderall XR 20 AM is helping focus and appetite. No SE Really scatter brained without Adderall.   Prefers Vyvanse but insurance covers poorly. Sleep good .  Sometimes doesn't want to get OOB.   Still has a lot of anxiety and gets worked up too much.  Irritable not anxious . Not flat. No panic.   New therapist next week, Skylar Rheinhart, [place].  11/13/23 appt noted: TC called complaining of anxiety and wanting clonazepam.   MD resp:  Will not RX clonazepam again bc she overdosed on it in suicide attempt.  Also we shoulc not combine BZ with stimulants and sh'e taking Adderall.  Will agree to clonidine 0.1 mg twice daily as needed for anxiety.  I will send in RX.      12/12/23 appt noted: Current psych meds:  Vraylar 3, sertraline 50 mg daily.  Dr. Earlene Plater Rx Adderall XR 20 AM is helping focus and appetite.  Clonidine 0.1 mg BID prn anxiety. Wellness doc rx propranolol for anxiety and tachycardia. 20 mg AM and helped a lot Clonidine helps if still anxious. M has CVD and worry over her.  Lost father a few years ago with MI.  Was at ER last night several hours with M.  Talked with M about moving in with her.  Decided to leave her [name]. Asks about med [place].  Lately sleep px.    Haven't been her true self in a long time.  Dont' smile much.  Don't laugh. [name]'s son  lives with them and feels so much pressure and anxiety from that situation.  Not happy with it.  [name] doesn't talk to her at home.   Not seeeing a counselor bc she lost her DT moving.  Would like a therapist. 3rd marriage.  No kids together  Remarried April 2020.  80 yo D & 39 yo D ADD .  One of the kids father has ADHD Disability review approved mostly for psych reasons.  Going to [facility] weight management and just started vitamin D. Level 24.5.  Past Psychiatric Medication Trials:  lamotrigine 300 twice daily, Equetro 900 daily,  Depakote 1500 SE,  History topiramate for migraine Vraylar 6 flat,  Abilify 15,  Seroquel worked but stopped DT insurance and SE RLS and hunger,  Latuda 120, lithium felt slowed down,   Sertraline 100 benefit  with Vraylar,  duloxetine,  Wellbutrin 300 min effect gabapentin 600 mg 3 times daily tiredness,  buspirone 30 twice daily,,  clonazepam, trazodone, Xanax , hydroxyzine NR, Hx phentermine seemed to help energy and mood.  Didn't help wt loss.  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  Cardiovascular:  Negative for palpitations.  Gastrointestinal:  Negative for nausea and vomiting.  Musculoskeletal:  Positive for arthralgias.       Recent hand surgery healing  Psychiatric/Behavioral:  Negative for agitation, behavioral problems and sleep disturbance. The patient is nervous/anxious.     Medications: I have reviewed the patient's current medications.  Current Outpatient Medications  Medication Sig Dispense Refill   albuterol (PROVENTIL) (2.5 MG/3ML) 0.083% nebulizer solution Take 3 mLs (2.5 mg total) by nebulization every 6 (six) hours as needed for wheezing or shortness of breath. 150 mL 1   albuterol (VENTOLIN HFA) 108  (90 Base) MCG/ACT inhaler TAKE 2 PUFFS BY MOUTH EVERY 6 HOURS AS NEEDED FOR WHEEZE OR SHORTNESS OF BREATH 18 each 2   amphetamine-dextroamphetamine (ADDERALL XR) 20 MG 24 hr capsule Take 20 mg by mouth 2 (two) times daily.     amphetamine-dextroamphetamine (ADDERALL) 10 MG tablet Take 10 mg by mouth daily in the afternoon.     aspirin 81 MG chewable tablet Chew 1 tablet (81 mg total) by mouth at bedtime.     Budeson-Glycopyrrol-Formoterol (BREZTRI AEROSPHERE) 160-9-4.8 MCG/ACT AERO Inhale 2 puffs into the lungs 2 (two) times daily.     Cariprazine HCl (VRAYLAR) 4.5 MG CAPS Take 1 capsule (4.5 mg total) by mouth daily. 90 capsule 0   cloNIDine (CATAPRES) 0.1 MG tablet Take 1 tablet (0.1 mg total) by mouth 2 (two) times daily as needed (anxiety). (Patient taking differently: Take 0.1 mg by mouth 2 (two) times daily as needed (anxiety). prn) 60 tablet 0   FASENRA 30 MG/ML SOSY Inject into the skin every 3 (three) months.     GLUCAGON EMERGENCY 1 MG injection Inject 1 mg into the skin as needed (hypoglycemia.).     glucose blood (ACCU-CHEK GUIDE) test strip Use to check blood sugar 3 time a day 300 strip 4   insulin lispro (HUMALOG) 100 UNIT/ML injection Inject up to 150 units daily according to insulin pump settings. 10 mL 0   levonorgestrel (MIRENA, 52 MG,) 20 MCG/24HR IUD 1 each by Intrauterine route once.     losartan (COZAAR) 25 MG tablet Take 25 mg by mouth daily.     meloxicam (MOBIC) 15 MG tablet Take 1 tablet (15 mg total) by mouth daily. 30 tablet 0   montelukast (SINGULAIR) 10 MG tablet Take 1 tablet (10 mg total) by mouth at bedtime. 90 tablet 1   MOUNJARO 10 MG/0.5ML Pen Inject 10 mg into the skin once a week.     Ubrogepant (  UBRELVY) 100 MG TABS Take 1 tablet (100 mg total) by mouth daily as needed (migraine headaches.). 30 tablet 3   pravastatin (PRAVACHOL) 20 MG tablet Take 1 tablet (20 mg total) by mouth at bedtime. 30 tablet 0   sertraline (ZOLOFT) 50 MG tablet Take 1 tablet (50 mg  total) by mouth daily. 90 tablet 0   No current facility-administered medications for this visit.    Medication Side Effects: tired in the afternoon.  Allergies:  Allergies  Allergen Reactions   Nsaids Other (See Comments)    Due to Renal disease   Other    Toradol [Ketorolac Tromethamine]     Cannot take d/t kidney issues    Ceftin Rash    Past Medical History:  Diagnosis Date   Anemia    Anemia of chronic disease 06/07/2012   Formatting of this note might be different from the original. Hematology consultation 07-13-16 (see note)--> Anemia of chronic disease Required 3 transfusions during pregnancy  Last Assessment & Plan:  Formatting of this note might be different from the original. She has now required 3 blood transfusions this pregnancy for her anemia of chronic disease. The most recent was one week ago when she bec   Anxiety    Asthma    Asthma 11/10/2013   Back pain    Bipolar 1 disorder (HCC)    Chest pain    Chronic renal failure syndrome, stage 3 (moderate) (HCC)    Colles' fracture of left radius 10/11/2013   Complication of anesthesia    Constipation    Depression    Depression    Diabetic gastroparesis (HCC)    Diabetic neuropathy, type I diabetes mellitus (HCC)    Diabetic ulcer of right great toe (HCC) 08/27/2020   Edema, lower extremity    Gallbladder disease    Gastroparesis    GERD (gastroesophageal reflux disease)    Headache(784.0)    Hypertension    Joint pain    Kidney stones    Palpitations    Polyneuropathy in diabetes(357.2)    PONV (postoperative nausea and vomiting)    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)    S/P carpal tunnel release left 10/16/19 11/04/2019   Shortness of breath    Tachycardia    baseline tachycardia    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)     Family History  Problem Relation Age of Onset   Diabetes Mother        type 2   Hypertension Mother    Obesity Mother    Diabetes  Father    Hypertension Father    High Cholesterol Father    Sleep apnea Father    Obesity Father    Heart disease Father    Heart attack Father    Diabetes Brother        type 1   Renal Disease Brother        renal failure   Hypertension Brother    Hypertension Maternal Grandmother    Breast cancer Maternal Aunt 64   Colon cancer Maternal Aunt    Uterine cancer Maternal Aunt 65   Breast cancer Other    Rectal cancer Neg Hx    Esophageal cancer Neg Hx     Social History   Socioeconomic History   Marital status: Married    Spouse name: Not on file   Number of children: 2   Years of education: 12   Highest education level: 12th grade  Occupational History   Occupation: Arts development officer  Tobacco Use   Smoking status: Never   Smokeless tobacco: Never  Vaping Use   Vaping status: Never Used  Substance and Sexual Activity   Alcohol use: No   Drug use: No   Sexual activity: Yes    Partners: Male    Birth control/protection: I.U.D.    Comment: Mirena  Other Topics Concern   Not on file  Social History Narrative   Pt has a daughter and boyfriend.    Dad passed away suddenly heart attack 12/2019   Social Drivers of Health   Financial Resource Strain: Medium Risk (12/07/2022)   Overall Financial Resource Strain (CARDIA)    Difficulty of Paying Living Expenses: Somewhat hard  Food Insecurity: Food Insecurity Present (07/09/2023)   Hunger Vital Sign    Worried About Running Out of Food in the Last Year: Sometimes true    Ran Out of Food in the Last Year: Sometimes true  Transportation Needs: No Transportation Needs (07/09/2023)   PRAPARE - Administrator, Civil Service (Medical): No    Lack of Transportation (Non-Medical): No  Physical Activity: Insufficiently Active (12/07/2022)   Exercise Vital Sign    Days of Exercise per Week: 2 days    Minutes of Exercise per Session: 30 min  Stress: No Stress Concern Present (12/07/2022)   [employer] of Occupational  Health - Occupational Stress Questionnaire    Feeling of Stress : Only a little  Social Connections: Moderately Integrated (12/07/2022)   Social Connection and Isolation Panel [NHANES]    Frequency of Communication with Friends and Family: More than three times a week    Frequency of Social Gatherings with Friends and Family: Once a week    Attends Religious Services: More than 4 times per year    Active Member of [employer] or Organizations: No    Attends Banker Meetings: Never    Marital Status: Married  Catering manager Violence: Not At Risk (07/09/2023)   Humiliation, Afraid, Rape, and Kick questionnaire    Fear of Current or Ex-Partner: No    Emotionally Abused: No    Physically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

## 2023-12-15 ENCOUNTER — Other Ambulatory Visit: Payer: Self-pay | Admitting: Family Medicine

## 2023-12-17 NOTE — Telephone Encounter (Signed)
 Requested medication (s) are due for refill today: No  Requested medication (s) are on the active medication list: No  Last refill:  ?  Future visit scheduled: No  Notes to clinic:  See request.    Requested Prescriptions  Pending Prescriptions Disp Refills   ADMELOG 100 UNIT/ML injection [Pharmacy Med Name: Admelog 100 UNIT/ML Subcutaneous Solution] 50 mL 0    Sig: INJECT UP TO 150 UNITS PER 24 HOURS SUBCUTANEOUSLY ACCORDING TO INSULIN PUMP SETTINGS     Endocrinology:  Diabetes - Insulins Failed - 12/17/2023  3:13 PM      Failed - HBA1C is between 0 and 7.9 and within 180 days    Hemoglobin A1C  Date Value Ref Range Status  03/17/2021 9.2  Final   Hgb A1c MFr Bld  Date Value Ref Range Status  07/08/2023 10.8 (H) 4.8 - 5.6 % Final    Comment:    (NOTE) Pre diabetes:          5.7%-6.4%  Diabetes:              >6.4%  Glycemic control for   <7.0% adults with diabetes          Passed - Valid encounter within last 6 months    Recent Outpatient Visits           5 months ago Chronic left hip pain   [facility], Ihor Austin, MD   2 years ago Mild persistent asthma with exacerbation   [facility] Valentino Nose, NP   3 years ago Mild intermittent asthma with exacerbation   [facility] Valentino Nose, NP   3 years ago Acute non-recurrent frontal sinusitis   [facility] Lawson Fiscal A, FNP   3 years ago BV (bacterial vaginosis)   [facility] Elmore Guise, FNP

## 2023-12-24 ENCOUNTER — Ambulatory Visit (INDEPENDENT_AMBULATORY_CARE_PROVIDER_SITE_OTHER): Admitting: Professional Counselor

## 2023-12-24 ENCOUNTER — Encounter: Payer: Self-pay | Admitting: Professional Counselor

## 2023-12-24 DIAGNOSIS — F3162 Bipolar disorder, current episode mixed, moderate: Secondary | ICD-10-CM
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
## 2023-12-31 ENCOUNTER — Encounter: Payer: Self-pay | Admitting: Family Medicine

## 2023-12-31 ENCOUNTER — Ambulatory Visit (INDEPENDENT_AMBULATORY_CARE_PROVIDER_SITE_OTHER): Admitting: Family Medicine

## 2023-12-31 VITALS — BP 138/92 | HR 97 | Temp 98.3°F | Ht 66.0 in | Wt 173.6 lb

## 2023-12-31 DIAGNOSIS — J45909 Unspecified asthma, uncomplicated: Secondary | ICD-10-CM

## 2023-12-31 DIAGNOSIS — G43709 Chronic migraine without aura, not intractable, without status migrainosus: Secondary | ICD-10-CM

## 2023-12-31 MED ORDER — EMGALITY 120 MG/ML ~~LOC~~ SOAJ: 120.0000 mg | SUBCUTANEOUS | 11 refills | Status: DC

## 2023-12-31 MED ORDER — UBRELVY 100 MG PO TABS
100.0000 mg | ORAL_TABLET | Freq: Every day | ORAL | 3 refills | Status: DC | PRN
Start: 2023-12-31 — End: 2024-07-18

## 2023-12-31 MED ORDER — MONTELUKAST SODIUM 10 MG PO TABS: 10.0000 mg | ORAL_TABLET | Freq: Every day | ORAL | 1 refills | Status: DC

## 2023-12-31 NOTE — Progress Notes (Unsigned)
 [name] D.[facility] [address] [place] [address] Phone: [phone]   Assessment and Plan:     There are no diagnoses linked to this encounter.  ***   Pertinent previous records reviewed include ***    Follow Up: ***     Subjective:   I, [name], am serving as a Neurosurgeon for Doctor Richardean Sale   Chief Complaint: leg pain    HPI:    02/28/2023 Patient is a 38 year old female complaining of leg pain. Patient states that she had left pain for years, she was dx with bursitis, she used to get CSI but the pain has changed, she had pain when sitting for long time, and then has pain when trying to walk after sitting , pain radiates to the back of the leg, sleep is hard she is a side sleeper, no numbness or tingling, tylenol doesn't help with the pain, she states she used to hear hip pop but doesn't do that stretching anymore that would cause the popping, she is TTP in that area , she states she can feel a knot    03/26/2023 Patient states she had some pain while on vacation. Back pain has been more frequent when she is in a sitting position for a long period of time. Left hip has been killing her   04/17/2023 Patient states after CSI pain calmed down . Went swimming yesterday so she is a little flared    06/20/2023 Patient states past couple of weeks leg pain has flared. PT tomorrow    07/19/2023 Patient states she is doing okay MRI caused some pain for a couple of days     08/21/2023 Patient states she is getting better. Notices pain when she is walking or sitting for long periods of time  01/01/2024 Patient  states   Relevant Historical Information: Hypertension, DM type I, CKD    Additional pertinent review of systems negative.   Current Outpatient Medications:    albuterol (PROVENTIL) (2.5 MG/3ML) 0.083% nebulizer solution, Take 3 mLs (2.5 mg total) by nebulization every 6 (six) hours as needed for wheezing or  shortness of breath., Disp: 150 mL, Rfl: 1   albuterol (VENTOLIN HFA) 108 (90 Base) MCG/ACT inhaler, TAKE 2 PUFFS BY MOUTH EVERY 6 HOURS AS NEEDED FOR WHEEZE OR SHORTNESS OF BREATH, Disp: 18 each, Rfl: 2   amphetamine-dextroamphetamine (ADDERALL XR) 20 MG 24 hr capsule, Take 20 mg by mouth 2 (two) times daily., Disp: , Rfl:    amphetamine-dextroamphetamine (ADDERALL) 10 MG tablet, Take 10 mg by mouth daily in the afternoon., Disp: , Rfl:    aspirin 81 MG chewable tablet, Chew 1 tablet (81 mg total) by mouth at bedtime., Disp: , Rfl:    Budeson-Glycopyrrol-Formoterol (BREZTRI AEROSPHERE) 160-9-4.8 MCG/ACT AERO, Inhale 2 puffs into the lungs 2 (two) times daily., Disp: , Rfl:    Cariprazine HCl (VRAYLAR) 4.5 MG CAPS, Take 1 capsule (4.5 mg total) by mouth daily. (Patient not taking: Reported on 12/31/2023), Disp: 90 capsule, Rfl: 0   cloNIDine (CATAPRES) 0.1 MG tablet, Take 1 tablet (0.1 mg total) by mouth 2 (two) times daily as needed (anxiety). (Patient taking differently: Take 0.1 mg by mouth 2 (two) times daily as needed (anxiety). prn), Disp: 60 tablet, Rfl: 0   FASENRA 30 MG/ML SOSY, Inject into the skin every 3 (three) months., Disp: , Rfl:    Galcanezumab-gnlm (EMGALITY) 120 MG/ML SOAJ, Inject 120 mg into the skin every 30 (  thirty) days., Disp: 1.12 mL, Rfl: 11   GLUCAGON EMERGENCY 1 MG injection, Inject 1 mg into the skin as needed (hypoglycemia.)., Disp: , Rfl:    glucose blood (ACCU-CHEK GUIDE) test strip, Use to check blood sugar 3 time a day, Disp: 300 strip, Rfl: 4   insulin lispro (HUMALOG) 100 UNIT/ML injection, Inject up to 150 units daily according to insulin pump settings., Disp: 10 mL, Rfl: 0   levonorgestrel (MIRENA, 52 MG,) 20 MCG/24HR IUD, 1 each by Intrauterine route once., Disp: , Rfl:    losartan (COZAAR) 25 MG tablet, Take 12.5 mg by mouth daily. Pt takes 12.5 mg. 12/31/23., Disp: , Rfl:    meloxicam (MOBIC) 15 MG tablet, Take 1 tablet (15 mg total) by mouth daily. (Patient not  taking: Reported on 12/31/2023), Disp: 30 tablet, Rfl: 0   montelukast (SINGULAIR) 10 MG tablet, Take 1 tablet (10 mg total) by mouth at bedtime., Disp: 90 tablet, Rfl: 1   MOUNJARO 10 MG/0.5ML Pen, Inject 10 mg into the skin once a week., Disp: , Rfl:    pravastatin (PRAVACHOL) 20 MG tablet, Take 1 tablet (20 mg total) by mouth at bedtime., Disp: 30 tablet, Rfl: 0   sertraline (ZOLOFT) 50 MG tablet, Take 1 tablet (50 mg total) by mouth daily., Disp: 90 tablet, Rfl: 0   Ubrogepant (UBRELVY) 100 MG TABS, Take 1 tablet (100 mg total) by mouth daily as needed (migraine headaches.)., Disp: 30 tablet, Rfl: 3   Objective:     There were no vitals filed for this visit.    There is no height or weight on file to calculate BMI.    Physical Exam:    ***   Electronically signed by:  [name] D.[facility] 12:50 PM 12/31/23

## 2023-12-31 NOTE — Progress Notes (Signed)
 Subjective:    Patient ID: [name], female    DOB: 1986/07/27, 38 y.o.   MRN: [id number]  Patient is a 38 year old Caucasian female with a history of recalcitrant migraines.  In the past she has tried Imitrex without relief.  She has tried Maxalt without relief.  She has tried Nurtec without relief.  She is currently on Ubrelvy which mitigates her headaches when she takes it.  She estimates that she gets a headache every 2 to 3 days.  She is having to use "tension headache pills" to manage her headaches.  These include aspirin, Tylenol, and caffeine.  I explained to the patient the role caffeine plays in causing rebound headache.  I recommended that she decrease this.  In the past she used Emgality for migraine prevention and this seemed to work extremely well.  She was seeing a neurologist. Past Medical History:  Diagnosis Date   Anemia    Anemia of chronic disease 06/07/2012   Formatting of this note might be different from the original. Hematology consultation 07-13-16 (see note)--> Anemia of chronic disease Required 3 transfusions during pregnancy  Last Assessment & Plan:  Formatting of this note might be different from the original. She has now required 3 blood transfusions this pregnancy for her anemia of chronic disease. The most recent was one week ago when she bec   Anxiety    Asthma    Asthma 11/10/2013   Back pain    Bipolar 1 disorder (HCC)    Chest pain    Chronic renal failure syndrome, stage 3 (moderate) (HCC)    Colles' fracture of left radius 10/11/2013   Complication of anesthesia    Constipation    Depression    Depression    Diabetic gastroparesis (HCC)    Diabetic neuropathy, type I diabetes mellitus (HCC)    Diabetic ulcer of right great toe (HCC) 08/27/2020   Edema, lower extremity    Gallbladder disease    Gastroparesis    GERD (gastroesophageal reflux disease)    Headache(784.0)    Hypertension    Joint pain    Kidney stones    Palpitations     Polyneuropathy in diabetes(357.2)    PONV (postoperative nausea and vomiting)    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)    S/P carpal tunnel release left 10/16/19 11/04/2019   Shortness of breath    Tachycardia    baseline tachycardia    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)    Past Surgical History:  Procedure Laterality Date   ANAL RECTAL MANOMETRY N/A 03/25/2018   Procedure: ANO RECTAL MANOMETRY;  Surgeon: Sherrilyn Rist, MD;  Location: [facility];  Service: Gastroenterology;  Laterality: N/A;   CARPAL TUNNEL RELEASE Left 10/16/2019   Procedure: LEFT CARPAL TUNNEL RELEASE;  Surgeon: Vickki Hearing, MD;  Location: [facility];  Service: Orthopedics;  Laterality: Left;   CESAREAN SECTION     x 2   CHOLECYSTECTOMY     EYE SURGERY     REFRACTIVE SURGERY Bilateral    Current Outpatient Medications on File Prior to Visit  Medication Sig Dispense Refill   albuterol (PROVENTIL) (2.5 MG/3ML) 0.083% nebulizer solution Take 3 mLs (2.5 mg total) by nebulization every 6 (six) hours as needed for wheezing or shortness of breath. 150 mL 1   albuterol (VENTOLIN HFA) 108 (90 Base) MCG/ACT inhaler TAKE 2 PUFFS BY MOUTH EVERY 6 HOURS AS NEEDED FOR WHEEZE OR SHORTNESS OF  BREATH 18 each 2   amphetamine-dextroamphetamine (ADDERALL XR) 20 MG 24 hr capsule Take 20 mg by mouth 2 (two) times daily.     amphetamine-dextroamphetamine (ADDERALL) 10 MG tablet Take 10 mg by mouth daily in the afternoon.     aspirin 81 MG chewable tablet Chew 1 tablet (81 mg total) by mouth at bedtime.     Budeson-Glycopyrrol-Formoterol (BREZTRI AEROSPHERE) 160-9-4.8 MCG/ACT AERO Inhale 2 puffs into the lungs 2 (two) times daily.     cloNIDine (CATAPRES) 0.1 MG tablet Take 1 tablet (0.1 mg total) by mouth 2 (two) times daily as needed (anxiety). (Patient taking differently: Take 0.1 mg by mouth 2 (two) times daily as needed (anxiety). prn) 60 tablet 0   FASENRA 30 MG/ML SOSY Inject into  the skin every 3 (three) months.     GLUCAGON EMERGENCY 1 MG injection Inject 1 mg into the skin as needed (hypoglycemia.).     glucose blood (ACCU-CHEK GUIDE) test strip Use to check blood sugar 3 time a day 300 strip 4   insulin lispro (HUMALOG) 100 UNIT/ML injection Inject up to 150 units daily according to insulin pump settings. 10 mL 0   levonorgestrel (MIRENA, 52 MG,) 20 MCG/24HR IUD 1 each by Intrauterine route once.     losartan (COZAAR) 25 MG tablet Take 12.5 mg by mouth daily. Pt takes 12.5 mg. 12/31/23.     MOUNJARO 10 MG/0.5ML Pen Inject 10 mg into the skin once a week.     Cariprazine HCl (VRAYLAR) 4.5 MG CAPS Take 1 capsule (4.5 mg total) by mouth daily. (Patient not taking: Reported on 12/31/2023) 90 capsule 0   meloxicam (MOBIC) 15 MG tablet Take 1 tablet (15 mg total) by mouth daily. (Patient not taking: Reported on 12/31/2023) 30 tablet 0   pravastatin (PRAVACHOL) 20 MG tablet Take 1 tablet (20 mg total) by mouth at bedtime. 30 tablet 0   sertraline (ZOLOFT) 50 MG tablet Take 1 tablet (50 mg total) by mouth daily. 90 tablet 0   No current facility-administered medications on file prior to visit.   Allergies  Allergen Reactions   Nsaids Other (See Comments)    Due to Renal disease   Other    Toradol [Ketorolac Tromethamine]     Cannot take d/t kidney issues    Ceftin Rash   Social History   Socioeconomic History   Marital status: Married    Spouse name: Not on file   Number of children: 2   Years of education: 12   Highest education level: 12th grade  Occupational History   Occupation: Arts development officer  Tobacco Use   Smoking status: Never   Smokeless tobacco: Never  Vaping Use   Vaping status: Never Used  Substance and Sexual Activity   Alcohol use: No   Drug use: No   Sexual activity: Yes    Partners: Male    Birth control/protection: I.U.D.    Comment: Mirena  Other Topics Concern   Not on file  Social History Narrative   Pt has a daughter and boyfriend.     Dad passed away suddenly heart attack 12/2019   Social Drivers of Health   Financial Resource Strain: Medium Risk (12/07/2022)   Overall Financial Resource Strain (CARDIA)    Difficulty of Paying Living Expenses: Somewhat hard  Food Insecurity: Food Insecurity Present (07/09/2023)   Hunger Vital Sign    Worried About Running Out of Food in the Last Year: Sometimes true    Ran Out of Food  in the Last Year: Sometimes true  Transportation Needs: No Transportation Needs (07/09/2023)   PRAPARE - Administrator, Civil Service (Medical): No    Lack of Transportation (Non-Medical): No  Physical Activity: Insufficiently Active (12/07/2022)   Exercise Vital Sign    Days of Exercise per Week: 2 days    Minutes of Exercise per Session: 30 min  Stress: No Stress Concern Present (12/07/2022)   [employer] of Occupational Health - Occupational Stress Questionnaire    Feeling of Stress : Only a little  Social Connections: Moderately Integrated (12/07/2022)   Social Connection and Isolation Panel [NHANES]    Frequency of Communication with Friends and Family: More than three times a week    Frequency of Social Gatherings with Friends and Family: Once a week    Attends Religious Services: More than 4 times per year    Active Member of [employer] or Organizations: No    Attends Banker Meetings: Never    Marital Status: Married  Catering manager Violence: Not At Risk (07/09/2023)   Humiliation, Afraid, Rape, and Kick questionnaire    Fear of Current or Ex-Partner: No    Emotionally Abused: No    Physically Abused: No    Sexually Abused: No     Review of Systems  Cardiovascular:  Positive for chest pain.  All other systems reviewed and are negative.      Objective:   Physical Exam Vitals reviewed.  Constitutional:      General: She is not in acute distress.    Appearance: She is well-developed and normal weight. She is not ill-appearing, toxic-appearing or diaphoretic.   Eyes:     Extraocular Movements: Extraocular movements intact.     Pupils: Pupils are equal, round, and reactive to light.  Cardiovascular:     Rate and Rhythm: Normal rate and regular rhythm.     Heart sounds: Normal heart sounds. No murmur heard. Pulmonary:     Effort: Pulmonary effort is normal. No respiratory distress.     Breath sounds: Normal breath sounds. No stridor. No wheezing or rales.  Abdominal:     General: Bowel sounds are normal. There is no distension.     Tenderness: There is no abdominal tenderness. There is no guarding or rebound.  Neurological:     General: No focal deficit present.     Mental Status: She is alert and oriented to person, place, and time. Mental status is at baseline.     Cranial Nerves: No cranial nerve deficit.     Sensory: No sensory deficit.     Motor: No weakness.     Coordination: Coordination normal.     Gait: Gait normal.   Patient has chronic strabismus        Assessment & Plan:  Chronic migraine w/o aura, not intractable, w/o stat migr  Moderate asthma without complication, unspecified whether persistent - Plan: montelukast (SINGULAIR) 10 MG tablet Patient gets upwards of 10-11 headaches per month.  I believe that this is likely migraines coupled with rebound headaches.  Recommended discontinuation of caffeine.  Begin Emgality 120 mg subcu monthly.  Use Ubrelvy for breakthrough headaches.  Refilled the pacing images at.  Patient is seeing an endocrinologist to manage her diabetes.  She is also seeing a nephrologist to manage her diabetic nephropathy.  She states that her cardiologist is checking her cholesterol.  Therefore I will defer her blood work to her specialist

## 2024-01-01 ENCOUNTER — Ambulatory Visit: Payer: Self-pay

## 2024-01-01 ENCOUNTER — Ambulatory Visit (INDEPENDENT_AMBULATORY_CARE_PROVIDER_SITE_OTHER): Admitting: Sports Medicine

## 2024-01-01 DIAGNOSIS — M7062 Trochanteric bursitis, left hip: Secondary | ICD-10-CM | POA: Diagnosis not present

## 2024-01-01 DIAGNOSIS — S76012D Strain of muscle, fascia and tendon of left hip, subsequent encounter: Secondary | ICD-10-CM | POA: Diagnosis not present

## 2024-01-01 DIAGNOSIS — M25552 Pain in left hip: Secondary | ICD-10-CM

## 2024-01-01 NOTE — Patient Instructions (Addendum)
 Start with low impact exercises like water aerobics, elliptical, etc. Referral to PT. Follow up in 4 weeks.

## 2024-01-02 ENCOUNTER — Telehealth: Payer: Self-pay | Admitting: Sports Medicine

## 2024-01-02 NOTE — Telephone Encounter (Signed)
 Called and spoke with pt

## 2024-01-02 NOTE — Telephone Encounter (Signed)
 Patient called stating that since her injection yesterday, her pain has been much worse.  Please advise.

## 2024-01-09 ENCOUNTER — Encounter: Payer: Self-pay | Admitting: Cardiovascular Disease

## 2024-01-09 MED ORDER — PRAVASTATIN SODIUM 20 MG PO TABS: 20.0000 mg | ORAL_TABLET | Freq: Every day | ORAL | 3 refills | Status: DC

## 2024-01-13 ENCOUNTER — Other Ambulatory Visit: Payer: Self-pay | Admitting: Family

## 2024-01-13 MED ORDER — INSULIN GLARGINE 100 UNIT/ML SOLOSTAR PEN
15.0000 [IU] | PEN_INJECTOR | Freq: Every day | SUBCUTANEOUS | 0 refills | Status: DC
Start: 2024-01-13 — End: 2024-01-14

## 2024-01-13 NOTE — Progress Notes (Signed)
 PT calls that her insulin pump is broken. Is in the process of getting a pump over night delivery. She has short acting insulin but needs Lantus sent in. Her glucose is >300 at this time.

## 2024-01-14 ENCOUNTER — Other Ambulatory Visit: Payer: Self-pay | Admitting: Family Medicine

## 2024-01-14 MED ORDER — TOUJEO SOLOSTAR 300 UNIT/ML ~~LOC~~ SOPN: 15.0000 [IU] | PEN_INJECTOR | Freq: Every day | SUBCUTANEOUS | 0 refills | Status: AC

## 2024-01-14 NOTE — Telephone Encounter (Signed)
 Requested medication (s) are due for refill today: see below  Requested medication (s) are on the active medication list: yes  Last refill:  01/13/24  Future visit scheduled: no  Notes to clinic:  Pharmacy comment: Alternative Requested:THE PRESCRIBED MEDICATION IS NOT COVERED BY INSURANCE. PLEASE CONSIDER CHANGING TO ONE OF THE SUGGESTED COVERED ALTERNATIVES.      Requested Prescriptions  Pending Prescriptions Disp Refills   Insulin Glargine (BASAGLAR KWIKPEN) 100 UNIT/ML [Pharmacy Med Name: BASAGLAR 100 UNIT/ML KWIKPEN]  0     There is no refill protocol information for this order

## 2024-01-17 ENCOUNTER — Encounter: Payer: Self-pay | Admitting: Professional Counselor

## 2024-01-17 ENCOUNTER — Ambulatory Visit (INDEPENDENT_AMBULATORY_CARE_PROVIDER_SITE_OTHER): Admitting: Professional Counselor

## 2024-01-17 DIAGNOSIS — F3162 Bipolar disorder, current episode mixed, moderate: Secondary | ICD-10-CM | POA: Diagnosis not present

## 2024-01-17 NOTE — Progress Notes (Addendum)
      [facility] Counselor/Therapist Progress Note  Patient ID: [name], MRN: [id number],    Date: 01/17/2024  Time Spent:1:13 PM to 2:14 PM  Treatment Type: Individual Therapy  Reported Symptoms: Intrusive memories, flashbacks, upset upon trauma cues, avoidance, strong negative beliefs, self blame, strong negative feelings, emotional numbing, irritability, easy startling, difficulty concentrating, sleep concerns, interpersonal concerns, stress, tearfulness  Mental Status Exam:  Appearance:   Neat     Behavior:  Appropriate, Sharing, and Motivated  Motor:  Normal  Speech/Language:   Clear and Coherent and Normal Rate  Affect:  Tearful  Mood:  sad  Thought process:  normal  Thought content:    WNL  Sensory/Perceptual disturbances:    WNL  Orientation:  oriented to person, place, time/date, and situation  Attention:  Good  Concentration:  Good  Memory:  WNL  Fund of knowledge:   Good  Insight:    Good  Judgment:   Good  Impulse Control:  Good   Risk Assessment: Danger to Self:  No Self-injurious Behavior: No Danger to Others: No Duty to Warn:no Physical Aggression / Violence:No  Access to Firearms a concern: No  Gang Involvement:No   Subjective: Patient presented to session to address concerns of bipolar disorder.  She reported mixed progress at this time.  Patient processed experience of difficult communications with her husband from whom she is now separated, and resulting impact on her wellbeing.  She reported having moved to her mother's 2 weeks ago, and processed sense of relief and safety that she feels as a result.  She processed experience of adjustment to new surroundings and living with her mom, but all in all voiced circumstance as a positive change.  Counselor and patient discussed patient implementation of boundaries with her husband, and strategies to limit people pleasing and approval seeking behavior and tendencies.  They discussed patient  negative self talk and patient mindfulness around cognitive restructuring.  Counselor and patient discussed patient trauma history, and counselor facilitated PCL 5 for which patient scored a 15 raw score and a 6 as relates positive symptoms.  Counselor and patient discussed results and patient significant posttraumatic growth and healing over time, and counselor reinforced patient resourcing of inner strengths including self empowerment and resiliency.  They also discussed patient treatment plan with intention to continue treatment planning upon next session.  Interventions: Solution-Oriented/Positive Psychology, Humanistic/Existential, Insight-Oriented, and CBT, Assessment  Diagnosis:   ICD-10-CM   1. Bipolar 1 disorder, mixed, moderate (HCC)  F31.62       Plan: Patient is scheduled for follow-up; continue to discuss and develop treatment plan and obtain consent; continue process work and developing coping skills.  Patient short-term goal between sessions to continue to reinforce boundaries interpersonally, and to prioritize self-care.  Progress note was dictated with Dragon and reviewed for accuracy.  Anthon Kins, [facility]

## 2024-01-18 ENCOUNTER — Other Ambulatory Visit: Payer: Self-pay

## 2024-01-18 ENCOUNTER — Ambulatory Visit: Attending: Sports Medicine | Admitting: Physical Therapy

## 2024-01-18 ENCOUNTER — Encounter: Payer: Self-pay | Admitting: Physical Therapy

## 2024-01-18 DIAGNOSIS — S76012D Strain of muscle, fascia and tendon of left hip, subsequent encounter: Secondary | ICD-10-CM | POA: Insufficient documentation

## 2024-01-18 DIAGNOSIS — R252 Cramp and spasm: Secondary | ICD-10-CM | POA: Insufficient documentation

## 2024-01-18 DIAGNOSIS — M6281 Muscle weakness (generalized): Secondary | ICD-10-CM | POA: Diagnosis present

## 2024-01-18 DIAGNOSIS — M25552 Pain in left hip: Secondary | ICD-10-CM | POA: Insufficient documentation

## 2024-01-18 DIAGNOSIS — M7062 Trochanteric bursitis, left hip: Secondary | ICD-10-CM | POA: Diagnosis not present

## 2024-01-18 NOTE — Therapy (Addendum)
 OUTPATIENT PHYSICAL THERAPY LOWER EXTREMITY EVALUATION   Patient Name: [name] MRN: [id number] DOB:01/27/1986, 38 y.o., female Today's Date: 01/30/2024  END OF SESSION:    Past Medical History:  Diagnosis Date   Anemia    Anemia of chronic disease 06/07/2012   Formatting of this note might be different from the original. Hematology consultation 07-13-16 (see note)--> Anemia of chronic disease Required 3 transfusions during pregnancy  Last Assessment & Plan:  Formatting of this note might be different from the original. She has now required 3 blood transfusions this pregnancy for her anemia of chronic disease. The most recent was one week ago when she bec   Anxiety    Asthma    Asthma 11/10/2013   Back pain    Bipolar 1 disorder (HCC)    Chest pain    Chronic renal failure syndrome, stage 3 (moderate) (HCC)    Colles' fracture of left radius 10/11/2013   Complication of anesthesia    Constipation    Depression    Depression    Diabetic gastroparesis (HCC)    Diabetic neuropathy, type I diabetes mellitus (HCC)    Diabetic ulcer of right great toe (HCC) 08/27/2020   Edema, lower extremity    Gallbladder disease    Gastroparesis    GERD (gastroesophageal reflux disease)    Headache(784.0)    Hypertension    Joint pain    Kidney stones    Palpitations    Polyneuropathy in diabetes(357.2)    PONV (postoperative nausea and vomiting)    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)    S/P carpal tunnel release left 10/16/19 11/04/2019   Shortness of breath    Tachycardia    baseline tachycardia    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)    Past Surgical History:  Procedure Laterality Date   ANAL RECTAL MANOMETRY N/A 03/25/2018   Procedure: ANO RECTAL MANOMETRY;  Surgeon: Albertina Hugger, MD;  Location: [facility];  Service: Gastroenterology;  Laterality: N/A;   CARPAL TUNNEL RELEASE Left 10/16/2019   Procedure: LEFT CARPAL  TUNNEL RELEASE;  Surgeon: Darrin Emerald, MD;  Location: [facility];  Service: Orthopedics;  Laterality: Left;   CESAREAN SECTION     x 2   CHOLECYSTECTOMY     EYE SURGERY     REFRACTIVE SURGERY Bilateral    Patient Active Problem List   Diagnosis Date Noted   Bipolar disorder, current episode depressed, severe, without psychotic features (HCC) 07/10/2023   Major depressive disorder, recurrent severe without psychotic features (HCC) 07/09/2023   Chronic left hip pain 07/03/2023   Overactive bladder 02/07/2022   Recurrent cold sores 02/02/2021   Iron deficiency anemia 12/19/2020   Long term (current) use of insulin  (HCC) 12/19/2020   Migraine 11/15/2020   CKD stage 3 secondary to diabetes (HCC) 11/15/2020   Diabetic neuropathy (HCC) 11/15/2020   Type 1 diabetes mellitus with stage 3b chronic kidney disease (HCC) 08/27/2020   Polyphagia 08/27/2020   Bipolar 1 disorder, mixed, severe (HCC) 08/27/2020   Type 1 diabetes mellitus with hyperglycemia (HCC) 08/27/2020   Financial difficulties 08/27/2020   Drug-induced weight gain 08/27/2020   Traction detachment of right retina 08/27/2020   Vitreous hemorrhage of left eye (HCC) 08/27/2020   Hypertension associated with diabetes (HCC) 08/27/2020   Type 1 diabetes mellitus with diabetic polyneuropathy 08/27/2020   Class 1 obesity with serious comorbidity and body mass index (BMI) of 33.0 to 33.9 in adult 08/27/2020  Status post eye surgery 07/01/2020   Chronic migraine without aura 01/22/2020   S/P eye surgery 08/29/2019   Diplopia 07/23/2019   Epiretinal membrane (ERM) of left eye 07/23/2019   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/2019   Pseudophakia of left eye 11/04/2018   Chronic kidney disease, stage 3b (HCC) 01/01/2017   Diabetic nephropathy associated with type 1 diabetes mellitus (HCC) 03/01/2016   After-cataract obscuring vision, left 09/22/2014   Drug overdose, intentional (HCC)  03/27/2014   Asthma 11/10/2013   MDD (major depressive disorder), recurrent episode, severe (HCC) 09/26/2013   Generalized anxiety disorder 09/26/2013   Aphakia, right eye 09/14/2012   Retinal detachment, tractional, left eye 06/11/2012   Eczema 06/07/2012   Diabetic retinopathy associated with type 1 diabetes mellitus (HCC) 04/30/2012   H/O insertion of insulin  pump 04/30/2012   History of atrial tachycardia 08/08/2010   Gastroparesis due to DM (HCC) 08/08/2010    PCP: Austine Lefort, MD   REFERRING PROVIDER: Ulysees Gander, DO  REFERRING DIAG: [phone] (ICD-10-CM) - Left hip pain S76.012D (ICD-10-CM) - Muscle strain of gluteal region, left, subsequent encounter M70.62 (ICD-10-CM) - Greater trochanteric bursitis of left hip  THERAPY DIAG:  Pain in left hip - Plan: PT plan of care cert/re-cert  Cramp and spasm - Plan: PT plan of care cert/re-cert  Muscle weakness (generalized) - Plan: PT plan of care cert/re-cert  Rationale for Evaluation and Treatment: Rehabilitation  ONSET DATE: Chronic with most recent flare up 1 year ago  SUBJECTIVE:   SUBJECTIVE STATEMENT: Patient presents with chronic left hip pain that has progressively gotten worse over the last year. She got an injection a month ago and she feels it works for a few months and wears off. She has been to PT before for her hip pain and they said she has a leg length discrepancy. She has anterior hip pain that is newer, but it was alleviate after she got a shot.Standing tolerance in unaffected with hip pain, walking tolerance is limited to grocery shopping distance, her sitting is tolerance is limited when having to travel far. Negotiating stairs is painful. Denies numbness and tingling. Patient has been out of the gym due to the hip pain, but she tries to be consistent.   PERTINENT HISTORY: Anemia; Anxiety; Depression; type 1 diabetes; HTN;  PAIN:  Are you having pain? Yes: NPRS scale: 0(currently)  8-9(worst)/10 Pain location: lateral left hip joint Pain description: achy; sharp Aggravating factors: walking & sitting/ laying on that side; stairs Relieving factors: Nothing  PRECAUTIONS: None  RED FLAGS: None   WEIGHT BEARING RESTRICTIONS: No  FALLS:  Has patient fallen in last 6 months? No  LIVING ENVIRONMENT: Lives with: lives with their family Lives in: House/apartment Stairs: Yes: External: 3 steps; on left going up and patient has a ramp   OCCUPATION: Disabled  PLOF: Independent, Independent with basic ADLs, Independent with gait, and Independent with transfers  PATIENT GOALS: Long lasting relief   NEXT MD VISIT: April 22  OBJECTIVE:  Note: Objective measures were completed at Evaluation unless otherwise noted.  DIAGNOSTIC FINDINGS: Lt hip MR with contrast 07/19/2023 MPRESSION: 1. Mild asymmetric gluteus minimus tendinosis on the left. No evidence of tendon tear. 2. No acute osseous findings or significant arthropathic changes. 3. No evidence of labral tear.    PATIENT SURVEYS:  LEFS 59/80 73%  COGNITION: Overall cognitive status: Within functional limits for tasks assessed     SENSATION: [facility]  MUSCLE LENGTH: Hamstrings: decreased bilateral Rt > Lt   POSTURE: rounded shoulders  PALPATION: Tenderness over Lt greater trochanter Faber + on Lt  Tenderness and increased muscle spasms bilateral piriformis  Obers (+) bilateral    LOWER EXTREMITY ROM: WFL PROM ROM  besides hip IR limited bilateral ; pain with Lt hip flexion PROM    LOWER EXTREMITY MMT:* pain  MMT Right eval Left eval  Hip flexion 5 4+*  Hip extension    Hip abduction 4 4  Hip adduction    Hip internal rotation    Hip external rotation    Knee flexion 5 5  Knee extension 5 4+*  Ankle dorsiflexion    Ankle plantarflexion    Ankle inversion    Ankle eversion     (Blank rows = not tested)   FUNCTIONAL TESTS:  5 times sit to stand: 9.24 no UE  support  GAIT:  Comments: Unremarkable                                                                                                                                TREATMENT DATE:  01/18/2024 Initial Evaluation & HEP created   If treatment provided at initial evaluation, no treatment charged due to lack of authorization.       PATIENT EDUCATION:  Education details: POC; examination findings; DN handout Person educated: Patient Education method: Explanation, Demonstration, and Handouts Education comprehension: verbalized understanding, returned demonstration, and needs further education  HOME EXERCISE PROGRAM: Access Code: [facility] URL: [email or web address] Date: 01/18/2024 Prepared by: Penelope Bowie  Exercises - Hip Flexion Stretch  - 1 x daily - 7 x weekly - 2 sets - 20-30 hold - Supine Hip Internal and External Rotation  - 1 x daily - 7 x weekly - 1 sets - 10 reps - Seated Hamstring Stretch  - 1 x daily - 7 x weekly - 2 sets - 20-30 hold - Seated Piriformis Stretch with Tr[place]  - 1 x daily - 7 x weekly - 2 sets - 20-30 hold  ASSESSMENT:  CLINICAL IMPRESSION: Patient is a 38 y.o. female who was seen today for physical therapy evaluation and treatment for left hip pain. [name] presents to therapy with chronic left pain that had recently flared up over the past year. She got an injection in her hip a month ago and that has alleviated some of her pain. She is the most limited in her walking tolerance and stair negotiation. Based on examination findings noted decreased hip IR, muscle weakness, and  increased muscle spasms in bilateral hip musculature. Educated patient on the benefit of dry needling and provided educational handout for her to review. Patient is motivated and wants to get better. Patient will benefit from skilled PT to address the below impairments and improve overall function.   OBJECTIVE IMPAIRMENTS: decreased activity tolerance, difficulty  walking, decreased ROM, decreased strength, hypomobility, increased muscle spasms, impaired flexibility, postural dysfunction, and pain.  ACTIVITY LIMITATIONS: sitting, squatting, stairs, and transfers  PARTICIPATION LIMITATIONS: cleaning, laundry, driving, shopping, and community activity  PERSONAL FACTORS: Fitness, Time since onset of injury/illness/exacerbation, and 3+ comorbidities: Anemia; Anxiety; Depression; type 1 diabetes; HTN;   are also affecting patient's functional outcome.   REHAB POTENTIAL: Good  CLINICAL DECISION MAKING: Stable/uncomplicated  EVALUATION COMPLEXITY: Low   GOALS: Goals reviewed with patient? Yes  SHORT TERM GOALS: Target date: 02/15/2024  Patient will be independent with initial HEP. Baseline:  Goal status: INITIAL  2.  Patient will report > or = to 30% improvement in left hip pain since starting PT. Baseline:  Goal status: INITIAL   3.  Patient will be able to walk for > or = to 15 minutes before onset of Lt hip pain. Baseline: 10 mins Goal status: INITIAL    LONG TERM GOALS: Target date: 03/14/2024  Patient will demonstrate independence in advanced HEP. Baseline:  Goal status: INITIAL  2.  Patient will report > or = to 70% improvement in Lt hip pain since starting PT. Baseline:  Goal status: INITIAL  3.  Patient will verbalize and demonstrate self-care strategies to manage pain including tissue mobility practices and change of position. Baseline:  Goal status: INITIAL   4.  Patient will be able to return to regular exercise regimen with proper form. Baseline:  Goal status: INITIAL  5.  Patient will be able to negotiate stairs with reciprocal pattern with < or = to 2/10 hip pain.  Baseline:  Goal status: INITIAL  6.  Patient will score > or = to 68/80 on LEFS for improved ability to perform ADLs. Baseline: 59/80 Goal status: INITIAL   PLAN:  PT FREQUENCY: 2x/week  PT DURATION: 8 weeks  PLANNED INTERVENTIONS: [id number]- PT  Re-evaluation, 97110-Therapeutic exercises, 97530- Therapeutic activity, [id number]- Neuromuscular re-education, 97535- Self Care, 16109- Manual therapy, [id number]- Gait training, [phone]- Canalith repositioning, [id number]- Aquatic Therapy, [phone]- Electrical stimulation (unattended), [phone]- Electrical stimulation (manual), [id number]- Vasopneumatic device, [id number]- Ultrasound, [id number]- Traction (mechanical), [id number]- Ionotophoresis 4mg /ml Dexamethasone, Balance training, Stair training, Taping, Dry Needling, Joint mobilization, Joint manipulation, Spinal manipulation, Spinal mobilization, Vestibular training, Cryotherapy, and Moist heat  PLAN FOR NEXT SESSION: Review HEP; ITB stretch ; DN/ manual; hip stability; 6 MWT; NuStep   Penelope Bowie, PT 01/30/24 8:55 AM

## 2024-01-28 NOTE — Progress Notes (Signed)
 [name] D.[name] [address] [place] [address] Phone: [phone]   Assessment and Plan:     1. Left hip pain 2. Muscle strain of gluteal region, left, subsequent encounter 3. Greater trochanteric bursitis of left hip  -Chronic with exacerbation, subsequent visit - Overall improvement in left hip and leg pain consistent with recurrent gluteus minimus tendinitis and greater trochanteric bursitis.  CSI and needling to greater trochanter, gluteus minimus insertion provided benefit, the patient did have flare of pain after a prolonged car ride - Recommend continuing HEP, physical therapy, and potentially starting dry needling - Do not recommend using NSAIDs due to past medical history of kidney disease per patient.  Ideally will avoid prednisone  use due to past medical history of DM type I - May use Tylenol  for day-to-day pain relief and topical Voltaren gel once daily as needed over areas of pain - Recommend stopping every 2-3 hours on car rides to stretch to decrease recurrent pain   Pertinent previous records reviewed include none  Follow Up: 4 to 6 weeks for reevaluation.  Could further consider PRP injection   Subjective:   I, [name], am serving as a Neurosurgeon for Doctor Ulysees Gander  Chief Complaint: leg pain    HPI:    02/28/2023 Patient is a 38 year old female complaining of leg pain. Patient states that she had left pain for years, she was dx with bursitis, she used to get CSI but the pain has changed, she had pain when sitting for long time, and then has pain when trying to walk after sitting , pain radiates to the back of the leg, sleep is hard she is a side sleeper, no numbness or tingling, tylenol  doesn't help with the pain, she states she used to hear hip pop but doesn't do that stretching anymore that would cause the popping, she is TTP in that area , she states she can feel a knot    03/26/2023 Patient states  she had some pain while on vacation. Back pain has been more frequent when she is in a sitting position for a long period of time. Left hip has been killing her   04/17/2023 Patient states after CSI pain calmed down . Went swimming yesterday so she is a little flared    06/20/2023 Patient states past couple of weeks leg pain has flared. PT tomorrow    07/19/2023 Patient states she is doing okay MRI caused some pain for a couple of days     08/21/2023 Patient states she is getting better. Notices pain when she is walking or sitting for long periods of time   01/01/2024 Patient states right now it is ok. It has become really bothersome if she sits too long. Sleep is uncomfortable where she has to keep moving around to set comfortable.  Started to flare back up this past month but was doing well before then. Had some pain in the front almost on the bone on the left side.   01/29/2024 Patient states she is doing a little better. Sat and Sunday she was flared was driving back from the beach. Started PT    Relevant Historical Information: Hypertension, DM type I, CKD  Additional pertinent review of systems negative.   Current Outpatient Medications:    albuterol  (PROVENTIL ) (2.5 MG/3ML) 0.083% nebulizer solution, Take 3 mLs (2.5 mg total) by nebulization every 6 (six) hours as needed for wheezing or shortness of breath., Disp: 150 mL,  Rfl: 1   albuterol  (VENTOLIN  HFA) 108 (90 Base) MCG/ACT inhaler, TAKE 2 PUFFS BY MOUTH EVERY 6 HOURS AS NEEDED FOR WHEEZE OR SHORTNESS OF BREATH, Disp: 18 each, Rfl: 2   amphetamine-dextroamphetamine (ADDERALL XR) 20 MG 24 hr capsule, Take 20 mg by mouth 2 (two) times daily., Disp: , Rfl:    amphetamine-dextroamphetamine (ADDERALL) 10 MG tablet, Take 10 mg by mouth daily in the afternoon., Disp: , Rfl:    aspirin  81 MG chewable tablet, Chew 1 tablet (81 mg total) by mouth at bedtime., Disp: , Rfl:    Budeson-Glycopyrrol-Formoterol  (BREZTRI AEROSPHERE) 160-9-4.8  MCG/ACT AERO, Inhale 2 puffs into the lungs 2 (two) times daily., Disp: , Rfl:    Cariprazine  HCl (VRAYLAR ) 4.5 MG CAPS, Take 1 capsule (4.5 mg total) by mouth daily., Disp: 90 capsule, Rfl: 0   cloNIDine  (CATAPRES ) 0.1 MG tablet, Take 1 tablet (0.1 mg total) by mouth 2 (two) times daily as needed (anxiety). (Patient taking differently: Take 0.1 mg by mouth 2 (two) times daily as needed (anxiety). prn), Disp: 60 tablet, Rfl: 0   FASENRA 30 MG/ML SOSY, Inject into the skin every 3 (three) months., Disp: , Rfl:    Galcanezumab -gnlm (EMGALITY ) 120 MG/ML SOAJ, Inject 120 mg into the skin every 30 (thirty) days., Disp: 1.12 mL, Rfl: 11   GLUCAGON EMERGENCY 1 MG injection, Inject 1 mg into the skin as needed (hypoglycemia.)., Disp: , Rfl:    glucose blood (ACCU-CHEK GUIDE) test strip, Use to check blood sugar 3 time a day, Disp: 300 strip, Rfl: 4   insulin  glargine, 1 Unit Dial, (TOUJEO  SOLOSTAR) 300 UNIT/ML Solostar Pen, Inject 15 Units into the skin daily., Disp: 4.5 mL, Rfl: 0   insulin  lispro (HUMALOG ) 100 UNIT/ML injection, Inject up to 150 units daily according to insulin  pump settings., Disp: 10 mL, Rfl: 0   levonorgestrel  (MIRENA , 52 MG,) 20 MCG/24HR IUD, 1 each by Intrauterine route once., Disp: , Rfl:    losartan  (COZAAR ) 25 MG tablet, Take 12.5 mg by mouth daily. Pt takes 12.5 mg. 12/31/23., Disp: , Rfl:    meloxicam  (MOBIC ) 15 MG tablet, Take 1 tablet (15 mg total) by mouth daily., Disp: 30 tablet, Rfl: 0   montelukast  (SINGULAIR ) 10 MG tablet, Take 1 tablet (10 mg total) by mouth at bedtime., Disp: 90 tablet, Rfl: 1   MOUNJARO  10 MG/0.5ML Pen, Inject 10 mg into the skin once a week., Disp: , Rfl:    pravastatin  (PRAVACHOL ) 20 MG tablet, Take 1 tablet (20 mg total) by mouth at bedtime., Disp: 90 tablet, Rfl: 3   Ubrogepant  (UBRELVY ) 100 MG TABS, Take 1 tablet (100 mg total) by mouth daily as needed (migraine headaches.)., Disp: 30 tablet, Rfl: 3   sertraline  (ZOLOFT ) 50 MG tablet, Take 1 tablet  (50 mg total) by mouth daily., Disp: 90 tablet, Rfl: 0   Objective:     Vitals:   01/29/24 1003  BP: 132/82  Pulse: 98  SpO2: 100%  Weight: 178 lb (80.7 kg)  Height: 5\' 6"  (1.676 m)      Body mass index is 28.73 kg/m.    Physical Exam:    General: awake, alert, and oriented no acute distress, nontoxic Skin: no suspicious lesions or rashes Neuro:sensation intact distally with no deficits, normal muscle tone, no atrophy, strength 5/5 in all tested lower ext groups Psych: normal mood and affect, speech clear   Left hip: No deformity, swelling or wasting ROM Flexion 90, ext 30, IR 45, ER 45 TTP  mildly greater trochanter, and mildly IT band, NTTP over the hip flexors,  gluteal musculature, lumbar spine and left lumbar paraspinal muscles Negative log roll with FROM Positive FABER for mild lateral and posterior hip pain Positive FADIR for mild lateral and posterior hip pain Positive piriformis test for lateral and posterior hip pain   Negative trendelenberg Gait normal  Straight leg raise negative   Electronically signed by:  Marshall Skeeter D.[name] 10:16 AM 01/29/24

## 2024-01-29 ENCOUNTER — Ambulatory Visit (INDEPENDENT_AMBULATORY_CARE_PROVIDER_SITE_OTHER): Admitting: Sports Medicine

## 2024-01-29 VITALS — BP 132/82 | HR 98 | Ht 66.0 in | Wt 178.0 lb

## 2024-01-29 DIAGNOSIS — M7062 Trochanteric bursitis, left hip: Secondary | ICD-10-CM | POA: Diagnosis not present

## 2024-01-29 DIAGNOSIS — M25552 Pain in left hip: Secondary | ICD-10-CM | POA: Diagnosis not present

## 2024-01-29 DIAGNOSIS — S76012D Strain of muscle, fascia and tendon of left hip, subsequent encounter: Secondary | ICD-10-CM | POA: Diagnosis not present

## 2024-01-29 NOTE — Patient Instructions (Signed)
 Continue HEP and PT  Tylenol  for day to day pain relief  Voltaren gel over areas of pain as needed  Recommend stopping every 2-3 hours on car rides 4-6 week follow up

## 2024-01-30 ENCOUNTER — Ambulatory Visit

## 2024-01-30 DIAGNOSIS — M25552 Pain in left hip: Secondary | ICD-10-CM | POA: Diagnosis not present

## 2024-01-30 DIAGNOSIS — M6281 Muscle weakness (generalized): Secondary | ICD-10-CM

## 2024-01-30 DIAGNOSIS — R252 Cramp and spasm: Secondary | ICD-10-CM

## 2024-01-30 NOTE — Therapy (Signed)
 OUTPATIENT PHYSICAL THERAPY LOWER EXTREMITY EVALUATION   Patient Name: [name] MRN: [id number] DOB:1986-05-21, 38 y.o., female Today's Date: 01/30/2024  END OF SESSION:  PT End of Session - 01/30/24 1046     Visit Number 2    Date for PT Re-Evaluation 03/14/24    Authorization Type Medicare/ Medicaid    Progress Note Due on Visit 10    PT Start Time 0934    PT Stop Time 1016    PT Time Calculation (min) 42 min    Activity Tolerance Patient tolerated treatment well    Behavior During Therapy [facility] for tasks assessed/performed              Past Medical History:  Diagnosis Date   Anemia    Anemia of chronic disease 06/07/2012   Formatting of this note might be different from the original. Hematology consultation 07-13-16 (see note)--> Anemia of chronic disease Required 3 transfusions during pregnancy  Last Assessment & Plan:  Formatting of this note might be different from the original. She has now required 3 blood transfusions this pregnancy for her anemia of chronic disease. The most recent was one week ago when she bec   Anxiety    Asthma    Asthma 11/10/2013   Back pain    Bipolar 1 disorder (HCC)    Chest pain    Chronic renal failure syndrome, stage 3 (moderate) (HCC)    Colles' fracture of left radius 10/11/2013   Complication of anesthesia    Constipation    Depression    Depression    Diabetic gastroparesis (HCC)    Diabetic neuropathy, type I diabetes mellitus (HCC)    Diabetic ulcer of right great toe (HCC) 08/27/2020   Edema, lower extremity    Gallbladder disease    Gastroparesis    GERD (gastroesophageal reflux disease)    Headache(784.0)    Hypertension    Joint pain    Kidney stones    Palpitations    Polyneuropathy in diabetes(357.2)    PONV (postoperative nausea and vomiting)    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)    S/P carpal tunnel release left 10/16/19 11/04/2019   Shortness of breath    Tachycardia    baseline tachycardia     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)    Past Surgical History:  Procedure Laterality Date   ANAL RECTAL MANOMETRY N/A 03/25/2018   Procedure: ANO RECTAL MANOMETRY;  Surgeon: Albertina Hugger, MD;  Location: [facility];  Service: Gastroenterology;  Laterality: N/A;   CARPAL TUNNEL RELEASE Left 10/16/2019   Procedure: LEFT CARPAL TUNNEL RELEASE;  Surgeon: Darrin Emerald, MD;  Location: [facility];  Service: Orthopedics;  Laterality: Left;   CESAREAN SECTION     x 2   CHOLECYSTECTOMY     EYE SURGERY     REFRACTIVE SURGERY Bilateral    Patient Active Problem List   Diagnosis Date Noted   Bipolar disorder, current episode depressed, severe, without psychotic features (HCC) 07/10/2023   Major depressive disorder, recurrent severe without psychotic features (HCC) 07/09/2023   Chronic left hip pain 07/03/2023   Overactive bladder 02/07/2022   Recurrent cold sores 02/02/2021   Iron deficiency anemia 12/19/2020   Long term (current) use of insulin  (HCC) 12/19/2020   Migraine 11/15/2020   CKD stage 3 secondary to diabetes (HCC) 11/15/2020   Diabetic neuropathy (HCC) 11/15/2020   Type 1 diabetes mellitus with stage 3b chronic kidney disease (  HCC) 08/27/2020   Polyphagia 08/27/2020   Bipolar 1 disorder, mixed, severe (HCC) 08/27/2020   Type 1 diabetes mellitus with hyperglycemia (HCC) 08/27/2020   Financial difficulties 08/27/2020   Drug-induced weight gain 08/27/2020   Traction detachment of right retina 08/27/2020   Vitreous hemorrhage of left eye (HCC) 08/27/2020   Hypertension associated with diabetes (HCC) 08/27/2020   Type 1 diabetes mellitus with diabetic polyneuropathy 08/27/2020   Class 1 obesity with serious comorbidity and body mass index (BMI) of 33.0 to 33.9 in adult 08/27/2020   Status post eye surgery 07/01/2020   Chronic migraine without aura 01/22/2020   S/P eye surgery 08/29/2019   Diplopia 07/23/2019   Epiretinal membrane  (ERM) of left eye 07/23/2019   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/2019   Pseudophakia of left eye 11/04/2018   Chronic kidney disease, stage 3b (HCC) 01/01/2017   Diabetic nephropathy associated with type 1 diabetes mellitus (HCC) 03/01/2016   After-cataract obscuring vision, left 09/22/2014   Drug overdose, intentional (HCC) 03/27/2014   Asthma 11/10/2013   MDD (major depressive disorder), recurrent episode, severe (HCC) 09/26/2013   Generalized anxiety disorder 09/26/2013   Aphakia, right eye 09/14/2012   Retinal detachment, tractional, left eye 06/11/2012   Eczema 06/07/2012   Diabetic retinopathy associated with type 1 diabetes mellitus (HCC) 04/30/2012   H/O insertion of insulin  pump 04/30/2012   History of atrial tachycardia 08/08/2010   Gastroparesis due to DM (HCC) 08/08/2010    PCP: Austine Lefort, MD   REFERRING PROVIDER: Ulysees Gander, DO  REFERRING DIAG: [id number] (ICD-10-CM) - Left hip pain S76.012D (ICD-10-CM) - Muscle strain of gluteal region, left, subsequent encounter M70.62 (ICD-10-CM) - Greater trochanteric bursitis of left hip  THERAPY DIAG:  Pain in left hip  Cramp and spasm  Muscle weakness (generalized)  Rationale for Evaluation and Treatment: Rehabilitation  ONSET DATE: Chronic with most recent flare up 1 year ago  SUBJECTIVE:   SUBJECTIVE STATEMENT: I am sore today. I was at the beach and the ride home was challenging.    From Eval: Patient presents with chronic left hip pain that has progressively gotten worse over the last year. She got an injection a month ago and she feels it works for a few months and wears off. She has been to PT before for her hip pain and they said she has a leg length discrepancy. She has anterior hip pain that is newer, but it was alleviate after she got a shot.Standing tolerance in unaffected with hip pain, walking tolerance is limited to grocery shopping distance, her sitting  is tolerance is limited when having to travel far. Negotiating stairs is painful. Denies numbness and tingling. Patient has been out of the gym due to the hip pain, but she tries to be consistent.   PERTINENT HISTORY: Anemia; Anxiety; Depression; type 1 diabetes; HTN;  PAIN: 01/30/24 Are you having pain? Yes: NPRS scale: 6/10 Pain location: lateral left hip joint Pain description: achy; sharp Aggravating factors: walking & sitting/ laying on that side; stairs Relieving factors: Nothing  PRECAUTIONS: None  RED FLAGS: None   WEIGHT BEARING RESTRICTIONS: No  FALLS:  Has patient fallen in last 6 months? No  LIVING ENVIRONMENT: Lives with: lives with their family Lives in: House/apartment Stairs: Yes: External: 3 steps; on left going up and patient has a ramp   OCCUPATION: Disabled  PLOF: Independent, Independent with basic ADLs, Independent with gait, and Independent with transfers  PATIENT GOALS:  Long lasting relief   NEXT MD VISIT: April 22  OBJECTIVE:  Note: Objective measures were completed at Evaluation unless otherwise noted.  DIAGNOSTIC FINDINGS: Lt hip MR with contrast 07/19/2023 MPRESSION: 1. Mild asymmetric gluteus minimus tendinosis on the left. No evidence of tendon tear. 2. No acute osseous findings or significant arthropathic changes. 3. No evidence of labral tear.    PATIENT SURVEYS:  LEFS 59/80 73%  COGNITION: Overall cognitive status: Within functional limits for tasks assessed     SENSATION: WFL  MUSCLE LENGTH: Hamstrings: decreased bilateral Rt > Lt   POSTURE: rounded shoulders  PALPATION: Tenderness over Lt greater trochanter Faber + on Lt  Tenderness and increased muscle spasms bilateral piriformis  Obers (+) bilateral    LOWER EXTREMITY ROM: WFL PROM ROM  besides hip IR limited bilateral ; pain with Lt hip flexion PROM   LOWER EXTREMITY MMT:* pain  MMT Right eval Left eval  Hip flexion 5 4+*  Hip extension    Hip abduction  4 4  Hip adduction    Hip internal rotation    Hip external rotation    Knee flexion 5 5  Knee extension 5 4+*  Ankle dorsiflexion    Ankle plantarflexion    Ankle inversion    Ankle eversion     (Blank rows = not tested)   FUNCTIONAL TESTS:  5 times sit to stand: 9.24 no UE support  GAIT: Comments: Unremarkable                                                                                                                    TREATMENT DATE:  01/30/2024  NuStep: Level 4x 6 minutes-PT present to discuss progress Seated hamstring stretch 3x20 sec Seated figure 4 stretch 3x20 sec Sit to stand: 10# kettlebell 2x10 Hip flexion stretch in supine 2x20 seconds  Sidelying clam and reverse clam x10  Trigger Point Dry Needling  Initial Treatment: Pt instructed on Dry Needling rational, procedures, and possible side effects. Pt instructed to expect mild to moderate muscle soreness later in the day and/or into the next day.  Pt instructed in methods to reduce muscle soreness. Pt instructed to continue prescribed HEP. Patient was educated on signs and symptoms of infection and other risk factors and advised to seek medical attention should they occur.  Patient verbalized understanding of these instructions and education.   Patient Verbal Consent Given: Yes Education Handout Provided: Previously Provided Muscles Treated: Lt gluteals, ITB and lateral quads  Electrical Stimulation Performed: No Treatment Response/Outcome: twitch response  Manual: elongation and release to Lt gluteals after dry needling.     01/18/2024 Initial Evaluation & HEP created   If treatment provided at initial evaluation, no treatment charged due to lack of authorization.       PATIENT EDUCATION:  Education details: POC; examination findings; DN handout Person educated: Patient Education method: Explanation, Demonstration, and Handouts Education comprehension: verbalized understanding, returned  demonstration, and needs further education  HOME EXERCISE PROGRAM: Access Code: [facility] URL:  [email or web address] Date: 01/30/2024 Prepared by: Loetta Ringer  Exercises - Hip Flexion Stretch  - 1 x daily - 7 x weekly - 2 sets - 20-30 hold - Supine Hip Internal and External Rotation  - 1 x daily - 7 x weekly - 1 sets - 10 reps - Seated Hamstring Stretch  - 1 x daily - 7 x weekly - 2 sets - 20-30 hold - Seated Piriformis Stretch with Trunk Bend  - 1 x daily - 7 x weekly - 2 sets - 20-30 hold - Sit to Stand Without Arm Support  - 2 x daily - 7 x weekly - 2 sets - 10 reps - Clamshell  - 2 x daily - 7 x weekly - 2 sets - 10 reps - Sidelying Reverse Clamshell  - 2 x daily - 7 x weekly - 2 sets - 10 reps  ASSESSMENT:  CLINICAL IMPRESSION: First time follow-up after evaluation.  Pt has been doing her HEP and was at the beach last week. Pt did well with HEP review and addition of strength exercises.  Good response to dry needling with improved tissue mobility and twitch response in Lt gluteals and lateral hip structures.   Patient is motivated and wants to get better. Patient will benefit from skilled PT to address the below impairments and improve overall function.   OBJECTIVE IMPAIRMENTS: decreased activity tolerance, difficulty walking, decreased ROM, decreased strength, hypomobility, increased muscle spasms, impaired flexibility, postural dysfunction, and pain.   ACTIVITY LIMITATIONS: sitting, squatting, stairs, and transfers  PARTICIPATION LIMITATIONS: cleaning, laundry, driving, shopping, and community activity  PERSONAL FACTORS: Fitness, Time since onset of injury/illness/exacerbation, and 3+ comorbidities: Anemia; Anxiety; Depression; type 1 diabetes; HTN;   are also affecting patient's functional outcome.   REHAB POTENTIAL: Good  CLINICAL DECISION MAKING: Stable/uncomplicated  EVALUATION COMPLEXITY: Low   GOALS: Goals reviewed with patient? Yes  SHORT TERM GOALS:  Target date: 02/15/2024  Patient will be independent with initial HEP. Baseline:  Goal status: INITIAL  2.  Patient will report > or = to 30% improvement in left hip pain since starting PT. Baseline:  Goal status: INITIAL   3.  Patient will be able to walk for > or = to 15 minutes before onset of Lt hip pain. Baseline: 10 mins Goal status: INITIAL    LONG TERM GOALS: Target date: 03/14/2024  Patient will demonstrate independence in advanced HEP. Baseline:  Goal status: INITIAL  2.  Patient will report > or = to 70% improvement in Lt hip pain since starting PT. Baseline:  Goal status: INITIAL  3.  Patient will verbalize and demonstrate self-care strategies to manage pain including tissue mobility practices and change of position. Baseline:  Goal status: INITIAL   4.  Patient will be able to return to regular exercise regimen with proper form. Baseline:  Goal status: INITIAL  5.  Patient will be able to negotiate stairs with reciprocal pattern with < or = to 2/10 hip pain.  Baseline:  Goal status: INITIAL  6.  Patient will score > or = to 68/80 on LEFS for improved ability to perform ADLs. Baseline: 59/80 Goal status: INITIAL   PLAN:  PT FREQUENCY: 2x/week  PT DURATION: 8 weeks  PLANNED INTERVENTIONS: [id number]- PT Re-evaluation, 97110-Therapeutic exercises, 97530- Therapeutic activity, [id number]- Neuromuscular re-education, 97535- Self Care, [id number]- Manual therapy, [id number]- Gait training, [phone]- Canalith repositioning, [id number]- Aquatic Therapy, [id number]- Electrical stimulation (unattended), [id number]- Electrical stimulation (manual), [id number]- Vasopneumatic device, [id number]- Ultrasound, [id number]- Traction (mechanical), [id number]- Ionotophoresis 4mg /ml Dexamethasone, Balance  training, Stair training, Taping, Dry Needling, Joint mobilization, Joint manipulation, Spinal manipulation, Spinal mobilization, Vestibular training, Cryotherapy, and Moist heat  PLAN FOR NEXT SESSION: Review HEP; ITB stretch ;see  how pt responded to dry needling ; hip stability; 6 MWT; NuStep  Luella Sager, PT 01/30/24 10:59 AM

## 2024-02-01 ENCOUNTER — Ambulatory Visit: Admitting: Physical Therapy

## 2024-02-01 ENCOUNTER — Encounter: Payer: Self-pay | Admitting: Physical Therapy

## 2024-02-01 DIAGNOSIS — M6281 Muscle weakness (generalized): Secondary | ICD-10-CM

## 2024-02-01 DIAGNOSIS — R252 Cramp and spasm: Secondary | ICD-10-CM

## 2024-02-01 DIAGNOSIS — M25552 Pain in left hip: Secondary | ICD-10-CM

## 2024-02-01 NOTE — Therapy (Signed)
 OUTPATIENT PHYSICAL THERAPY LOWER EXTREMITY TREATMENT   Patient Name: [name] MRN: [id number] DOB:1986/03/06, 38 y.o., female Today's Date: 02/01/2024  END OF SESSION:  PT End of Session - 02/01/24 1018     Visit Number 3    Date for PT Re-Evaluation 03/14/24    Authorization Type Medicare/ Medicaid    Progress Note Due on Visit 10    PT Start Time 0930    PT Stop Time 1010    PT Time Calculation (min) 40 min    Activity Tolerance Patient tolerated treatment well    Behavior During Therapy [facility] for tasks assessed/performed               Past Medical History:  Diagnosis Date   Anemia    Anemia of chronic disease 06/07/2012   Formatting of this note might be different from the original. Hematology consultation 07-13-16 (see note)--> Anemia of chronic disease Required 3 transfusions during pregnancy  Last Assessment & Plan:  Formatting of this note might be different from the original. She has now required 3 blood transfusions this pregnancy for her anemia of chronic disease. The most recent was one week ago when she bec   Anxiety    Asthma    Asthma 11/10/2013   Back pain    Bipolar 1 disorder (HCC)    Chest pain    Chronic renal failure syndrome, stage 3 (moderate) (HCC)    Colles' fracture of left radius 10/11/2013   Complication of anesthesia    Constipation    Depression    Depression    Diabetic gastroparesis (HCC)    Diabetic neuropathy, type I diabetes mellitus (HCC)    Diabetic ulcer of right great toe (HCC) 08/27/2020   Edema, lower extremity    Gallbladder disease    Gastroparesis    GERD (gastroesophageal reflux disease)    Headache(784.0)    Hypertension    Joint pain    Kidney stones    Palpitations    Polyneuropathy in diabetes(357.2)    PONV (postoperative nausea and vomiting)    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)    S/P carpal tunnel release left 10/16/19 11/04/2019   Shortness of breath    Tachycardia    baseline tachycardia     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)    Past Surgical History:  Procedure Laterality Date   ANAL RECTAL MANOMETRY N/A 03/25/2018   Procedure: ANO RECTAL MANOMETRY;  Surgeon: Albertina Hugger, MD;  Location: [facility];  Service: Gastroenterology;  Laterality: N/A;   CARPAL TUNNEL RELEASE Left 10/16/2019   Procedure: LEFT CARPAL TUNNEL RELEASE;  Surgeon: Darrin Emerald, MD;  Location: [facility];  Service: Orthopedics;  Laterality: Left;   CESAREAN SECTION     x 2   CHOLECYSTECTOMY     EYE SURGERY     REFRACTIVE SURGERY Bilateral    Patient Active Problem List   Diagnosis Date Noted   Bipolar disorder, current episode depressed, severe, without psychotic features (HCC) 07/10/2023   Major depressive disorder, recurrent severe without psychotic features (HCC) 07/09/2023   Chronic left hip pain 07/03/2023   Overactive bladder 02/07/2022   Recurrent cold sores 02/02/2021   Iron deficiency anemia 12/19/2020   Long term (current) use of insulin  (HCC) 12/19/2020   Migraine 11/15/2020   CKD stage 3 secondary to diabetes (HCC) 11/15/2020   Diabetic neuropathy (HCC) 11/15/2020   Type 1 diabetes mellitus with stage 3b chronic kidney  disease (HCC) 08/27/2020   Polyphagia 08/27/2020   Bipolar 1 disorder, mixed, severe (HCC) 08/27/2020   Type 1 diabetes mellitus with hyperglycemia (HCC) 08/27/2020   Financial difficulties 08/27/2020   Drug-induced weight gain 08/27/2020   Traction detachment of right retina 08/27/2020   Vitreous hemorrhage of left eye (HCC) 08/27/2020   Hypertension associated with diabetes (HCC) 08/27/2020   Type 1 diabetes mellitus with diabetic polyneuropathy 08/27/2020   Class 1 obesity with serious comorbidity and body mass index (BMI) of 33.0 to 33.9 in adult 08/27/2020   Status post eye surgery 07/01/2020   Chronic migraine without aura 01/22/2020   S/P eye surgery 08/29/2019   Diplopia 07/23/2019   Epiretinal membrane  (ERM) of left eye 07/23/2019   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/2019   Pseudophakia of left eye 11/04/2018   Chronic kidney disease, stage 3b (HCC) 01/01/2017   Diabetic nephropathy associated with type 1 diabetes mellitus (HCC) 03/01/2016   After-cataract obscuring vision, left 09/22/2014   Drug overdose, intentional (HCC) 03/27/2014   Asthma 11/10/2013   MDD (major depressive disorder), recurrent episode, severe (HCC) 09/26/2013   Generalized anxiety disorder 09/26/2013   Aphakia, right eye 09/14/2012   Retinal detachment, tractional, left eye 06/11/2012   Eczema 06/07/2012   Diabetic retinopathy associated with type 1 diabetes mellitus (HCC) 04/30/2012   H/O insertion of insulin  pump 04/30/2012   History of atrial tachycardia 08/08/2010   Gastroparesis due to DM (HCC) 08/08/2010    PCP: Austine Lefort, MD   REFERRING PROVIDER: Ulysees Gander, DO  REFERRING DIAG: [phone] (ICD-10-CM) - Left hip pain S76.012D (ICD-10-CM) - Muscle strain of gluteal region, left, subsequent encounter M70.62 (ICD-10-CM) - Greater trochanteric bursitis of left hip  THERAPY DIAG:  Pain in left hip  Cramp and spasm  Muscle weakness (generalized)  Rationale for Evaluation and Treatment: Rehabilitation  ONSET DATE: Chronic with most recent flare up 1 year ago  SUBJECTIVE:   SUBJECTIVE STATEMENT: I am sore from dry needling. But I feel good. Pain 3-4/10  From Eval: Patient presents with chronic left hip pain that has progressively gotten worse over the last year. She got an injection a month ago and she feels it works for a few months and wears off. She has been to PT before for her hip pain and they said she has a leg length discrepancy. She has anterior hip pain that is newer, but it was alleviate after she got a shot.Standing tolerance in unaffected with hip pain, walking tolerance is limited to grocery shopping distance, her sitting is tolerance  is limited when having to travel far. Negotiating stairs is painful. Denies numbness and tingling. Patient has been out of the gym due to the hip pain, but she tries to be consistent.   PERTINENT HISTORY: Anemia; Anxiety; Depression; type 1 diabetes; HTN;  PAIN: 02/01/24 Are you having pain? Yes: NPRS scale: 3-4/10 Pain location: lateral left hip joint Pain description: achy; sharp Aggravating factors: walking & sitting/ laying on that side; stairs Relieving factors: Nothing  PRECAUTIONS: None  RED FLAGS: None   WEIGHT BEARING RESTRICTIONS: No  FALLS:  Has patient fallen in last 6 months? No  LIVING ENVIRONMENT: Lives with: lives with their family Lives in: House/apartment Stairs: Yes: External: 3 steps; on left going up and patient has a ramp   OCCUPATION: Disabled  PLOF: Independent, Independent with basic ADLs, Independent with gait, and Independent with transfers  PATIENT GOALS: Long lasting relief  NEXT MD VISIT: April 22  OBJECTIVE:  Note: Objective measures were completed at Evaluation unless otherwise noted.  DIAGNOSTIC FINDINGS: Lt hip MR with contrast 07/19/2023 MPRESSION: 1. Mild asymmetric gluteus minimus tendinosis on the left. No evidence of tendon tear. 2. No acute osseous findings or significant arthropathic changes. 3. No evidence of labral tear.    PATIENT SURVEYS:  LEFS 59/80 73%  COGNITION: Overall cognitive status: Within functional limits for tasks assessed     SENSATION: WFL  MUSCLE LENGTH: Hamstrings: decreased bilateral Rt > Lt   POSTURE: rounded shoulders  PALPATION: Tenderness over Lt greater trochanter Faber + on Lt  Tenderness and increased muscle spasms bilateral piriformis  Obers (+) bilateral    LOWER EXTREMITY ROM: WFL PROM ROM  besides hip IR limited bilateral ; pain with Lt hip flexion PROM   LOWER EXTREMITY MMT:* pain  MMT Right eval Left eval  Hip flexion 5 4+*  Hip extension    Hip abduction 4 4  Hip  adduction    Hip internal rotation    Hip external rotation    Knee flexion 5 5  Knee extension 5 4+*  Ankle dorsiflexion    Ankle plantarflexion    Ankle inversion    Ankle eversion     (Blank rows = not tested)   FUNCTIONAL TESTS:  5 times sit to stand: 9.24 no UE support  GAIT: Comments: Unremarkable                                                                                                                    TREATMENT DATE:  02/01/2024  NuStep: Level 5  x 6 minutes-PT present to discuss progress Seated hamstring stretch 3x20 sec bilateral  Seated figure 4 stretch 3x20 sec bilateral  Hip flexion stretch in supine 2x20 seconds Bridge 2 x 10 SL hip abduction + purple ball press x 12 bilateral  Side stepping with red loop x 3 laps Monster walks with red loop x 3 laps   Hinging with dowel as tactile cue & mirror x 10 Hinging no dowel x 10 Deadlift 5# db with cone infront for target 2 x 10  March holding 10# KB shoulder height 2 x 20     01/30/2024  NuStep: Level 4x 6 minutes-PT present to discuss progress Seated hamstring stretch 3x20 sec Seated figure 4 stretch 3x20 sec Sit to stand: 10# kettlebell 2x10 Hip flexion stretch in supine 2x20 seconds  Sidelying clam and reverse clam x10  Trigger Point Dry Needling  Initial Treatment: Pt instructed on Dry Needling rational, procedures, and possible side effects. Pt instructed to expect mild to moderate muscle soreness later in the day and/or into the next day.  Pt instructed in methods to reduce muscle soreness. Pt instructed to continue prescribed HEP. Patient was educated on signs and symptoms of infection and other risk factors and advised to seek medical attention should they occur.  Patient verbalized understanding of these instructions and education.   Patient Verbal Consent Given: Yes  Education Handout Provided: Previously Provided Muscles Treated: Lt gluteals, ITB and lateral quads  Electrical Stimulation  Performed: No Treatment Response/Outcome: twitch response  Manual: elongation and release to Lt gluteals after dry needling.     01/18/2024 Initial Evaluation & HEP created   If treatment provided at initial evaluation, no treatment charged due to lack of authorization.       PATIENT EDUCATION:  Education details: POC; examination findings; DN handout Person educated: Patient Education method: Explanation, Demonstration, and Handouts Education comprehension: verbalized understanding, returned demonstration, and needs further education  HOME EXERCISE PROGRAM: Access Code: [facility] URL: [email or web address] Date: 01/30/2024 Prepared by: Loetta Ringer  Exercises - Hip Flexion Stretch  - 1 x daily - 7 x weekly - 2 sets - 20-30 hold - Supine Hip Internal and External Rotation  - 1 x daily - 7 x weekly - 1 sets - 10 reps - Seated Hamstring Stretch  - 1 x daily - 7 x weekly - 2 sets - 20-30 hold - Seated Piriformis Stretch with Trunk Bend  - 1 x daily - 7 x weekly - 2 sets - 20-30 hold - Sit to Stand Without Arm Support  - 2 x daily - 7 x weekly - 2 sets - 10 reps - Clamshell  - 2 x daily - 7 x weekly - 2 sets - 10 reps - Sidelying Reverse Clamshell  - 2 x daily - 7 x weekly - 2 sets - 10 reps  ASSESSMENT: Patient presents to therapy with mild hip pain today. She was sore after dry needling, but she verbalized some pain relief. Incorporated more hip strengthening today and added exercises to HEP. Patient required verbal and visual cues for correct exercise performance. Patient did not verbalize any pain or discomfort during treatment session. Patient should continue to progress well with physical therapy. Patient will benefit from skilled PT to address the below impairments and improve overall function.    OBJECTIVE IMPAIRMENTS: decreased activity tolerance, difficulty walking, decreased ROM, decreased strength, hypomobility, increased muscle spasms, impaired flexibility, postural  dysfunction, and pain.   ACTIVITY LIMITATIONS: sitting, squatting, stairs, and transfers  PARTICIPATION LIMITATIONS: cleaning, laundry, driving, shopping, and community activity  PERSONAL FACTORS: Fitness, Time since onset of injury/illness/exacerbation, and 3+ comorbidities: Anemia; Anxiety; Depression; type 1 diabetes; HTN;   are also affecting patient's functional outcome.   REHAB POTENTIAL: Good  CLINICAL DECISION MAKING: Stable/uncomplicated  EVALUATION COMPLEXITY: Low   GOALS: Goals reviewed with patient? Yes  SHORT TERM GOALS: Target date: 02/15/2024  Patient will be independent with initial HEP. Baseline:  Goal status: INITIAL  2.  Patient will report > or = to 30% improvement in left hip pain since starting PT. Baseline:  Goal status: INITIAL   3.  Patient will be able to walk for > or = to 15 minutes before onset of Lt hip pain. Baseline: 10 mins Goal status: INITIAL    LONG TERM GOALS: Target date: 03/14/2024  Patient will demonstrate independence in advanced HEP. Baseline:  Goal status: INITIAL  2.  Patient will report > or = to 70% improvement in Lt hip pain since starting PT. Baseline:  Goal status: INITIAL  3.  Patient will verbalize and demonstrate self-care strategies to manage pain including tissue mobility practices and change of position. Baseline:  Goal status: INITIAL   4.  Patient will be able to return to regular exercise regimen with proper form. Baseline:  Goal status: INITIAL  5.  Patient will be able to negotiate stairs with  reciprocal pattern with < or = to 2/10 hip pain.  Baseline:  Goal status: INITIAL  6.  Patient will score > or = to 68/80 on LEFS for improved ability to perform ADLs. Baseline: 59/80 Goal status: INITIAL   PLAN:  PT FREQUENCY: 2x/week  PT DURATION: 8 weeks  PLANNED INTERVENTIONS: [id number]- PT Re-evaluation, 97110-Therapeutic exercises, 97530- Therapeutic activity, [id number]- Neuromuscular re-education, 97535-  Self Care, 16109- Manual therapy, [id number]- Gait training, [phone]- Canalith repositioning, [id number]- Aquatic Therapy, [phone]- Electrical stimulation (unattended), [phone]- Electrical stimulation (manual), [id number]- Vasopneumatic device, [id number]- Ultrasound, [id number]- Traction (mechanical), [id number]- Ionotophoresis 4mg /ml Dexamethasone, Balance training, Stair training, Taping, Dry Needling, Joint mobilization, Joint manipulation, Spinal manipulation, Spinal mobilization, Vestibular training, Cryotherapy, and Moist heat  PLAN FOR NEXT SESSION: assess tolerance to treatment session & updated HEP; hip strength; manual/ DN as indicated   Penelope Bowie, PT 02/01/24 10:20 AM

## 2024-02-05 ENCOUNTER — Ambulatory Visit: Admitting: Rehabilitative and Restorative Service Providers"

## 2024-02-07 ENCOUNTER — Ambulatory Visit: Attending: Sports Medicine | Admitting: Physical Therapy

## 2024-02-07 ENCOUNTER — Encounter: Payer: Self-pay | Admitting: Physical Therapy

## 2024-02-07 DIAGNOSIS — M6281 Muscle weakness (generalized): Secondary | ICD-10-CM | POA: Diagnosis present

## 2024-02-07 DIAGNOSIS — R252 Cramp and spasm: Secondary | ICD-10-CM | POA: Insufficient documentation

## 2024-02-07 DIAGNOSIS — M25552 Pain in left hip: Secondary | ICD-10-CM | POA: Insufficient documentation

## 2024-02-07 NOTE — Therapy (Signed)
 OUTPATIENT PHYSICAL THERAPY LOWER EXTREMITY TREATMENT   Patient Name: [name] MRN: [id number] DOB:1986-01-10, 38 y.o., female Today's Date: 02/07/2024  END OF SESSION:  PT End of Session - 02/07/24 1248     Visit Number 4    Date for PT Re-Evaluation 03/14/24    Authorization Type Medicare/ Medicaid    Progress Note Due on Visit 10    PT Start Time 1147    PT Stop Time 1234    PT Time Calculation (min) 47 min    Activity Tolerance Patient tolerated treatment well    Behavior During Therapy [facility] for tasks assessed/performed                Past Medical History:  Diagnosis Date   Anemia    Anemia of chronic disease 06/07/2012   Formatting of this note might be different from the original. Hematology consultation 07-13-16 (see note)--> Anemia of chronic disease Required 3 transfusions during pregnancy  Last Assessment & Plan:  Formatting of this note might be different from the original. She has now required 3 blood transfusions this pregnancy for her anemia of chronic disease. The most recent was one week ago when she bec   Anxiety    Asthma    Asthma 11/10/2013   Back pain    Bipolar 1 disorder (HCC)    Chest pain    Chronic renal failure syndrome, stage 3 (moderate) (HCC)    Colles' fracture of left radius 10/11/2013   Complication of anesthesia    Constipation    Depression    Depression    Diabetic gastroparesis (HCC)    Diabetic neuropathy, type I diabetes mellitus (HCC)    Diabetic ulcer of right great toe (HCC) 08/27/2020   Edema, lower extremity    Gallbladder disease    Gastroparesis    GERD (gastroesophageal reflux disease)    Headache(784.0)    Hypertension    Joint pain    Kidney stones    Palpitations    Polyneuropathy in diabetes(357.2)    PONV (postoperative nausea and vomiting)    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)    S/P carpal tunnel release left 10/16/19 11/04/2019   Shortness of breath    Tachycardia    baseline tachycardia     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)    Past Surgical History:  Procedure Laterality Date   ANAL RECTAL MANOMETRY N/A 03/25/2018   Procedure: ANO RECTAL MANOMETRY;  Surgeon: Albertina Hugger, MD;  Location: [facility];  Service: Gastroenterology;  Laterality: N/A;   CARPAL TUNNEL RELEASE Left 10/16/2019   Procedure: LEFT CARPAL TUNNEL RELEASE;  Surgeon: Darrin Emerald, MD;  Location: [facility];  Service: Orthopedics;  Laterality: Left;   CESAREAN SECTION     x 2   CHOLECYSTECTOMY     EYE SURGERY     REFRACTIVE SURGERY Bilateral    Patient Active Problem List   Diagnosis Date Noted   Bipolar disorder, current episode depressed, severe, without psychotic features (HCC) 07/10/2023   Major depressive disorder, recurrent severe without psychotic features (HCC) 07/09/2023   Chronic left hip pain 07/03/2023   Overactive bladder 02/07/2022   Recurrent cold sores 02/02/2021   Iron deficiency anemia 12/19/2020   Long term (current) use of insulin  (HCC) 12/19/2020   Migraine 11/15/2020   CKD stage 3 secondary to diabetes (HCC) 11/15/2020   Diabetic neuropathy (HCC) 11/15/2020   Type 1 diabetes mellitus with stage 3b chronic  kidney disease (HCC) 08/27/2020   Polyphagia 08/27/2020   Bipolar 1 disorder, mixed, severe (HCC) 08/27/2020   Type 1 diabetes mellitus with hyperglycemia (HCC) 08/27/2020   Financial difficulties 08/27/2020   Drug-induced weight gain 08/27/2020   Traction detachment of right retina 08/27/2020   Vitreous hemorrhage of left eye (HCC) 08/27/2020   Hypertension associated with diabetes (HCC) 08/27/2020   Type 1 diabetes mellitus with diabetic polyneuropathy 08/27/2020   Class 1 obesity with serious comorbidity and body mass index (BMI) of 33.0 to 33.9 in adult 08/27/2020   Status post eye surgery 07/01/2020   Chronic migraine without aura 01/22/2020   S/P eye surgery 08/29/2019   Diplopia 07/23/2019   Epiretinal membrane  (ERM) of left eye 07/23/2019   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/2019   Pseudophakia of left eye 11/04/2018   Chronic kidney disease, stage 3b (HCC) 01/01/2017   Diabetic nephropathy associated with type 1 diabetes mellitus (HCC) 03/01/2016   After-cataract obscuring vision, left 09/22/2014   Drug overdose, intentional (HCC) 03/27/2014   Asthma 11/10/2013   MDD (major depressive disorder), recurrent episode, severe (HCC) 09/26/2013   Generalized anxiety disorder 09/26/2013   Aphakia, right eye 09/14/2012   Retinal detachment, tractional, left eye 06/11/2012   Eczema 06/07/2012   Diabetic retinopathy associated with type 1 diabetes mellitus (HCC) 04/30/2012   H/O insertion of insulin  pump 04/30/2012   History of atrial tachycardia 08/08/2010   Gastroparesis due to DM (HCC) 08/08/2010    PCP: Austine Lefort, MD   REFERRING PROVIDER: Ulysees Gander, DO  REFERRING DIAG: [phone] (ICD-10-CM) - Left hip pain S76.012D (ICD-10-CM) - Muscle strain of gluteal region, left, subsequent encounter M70.62 (ICD-10-CM) - Greater trochanteric bursitis of left hip  THERAPY DIAG:  Pain in left hip  Cramp and spasm  Muscle weakness (generalized)  Rationale for Evaluation and Treatment: Rehabilitation  ONSET DATE: Chronic with most recent flare up 1 year ago  SUBJECTIVE:   SUBJECTIVE STATEMENT: Patient reports she is okay today. She has had some left knee pain with squats. She felt sore after last treatment session  From Eval: Patient presents with chronic left hip pain that has progressively gotten worse over the last year. She got an injection a month ago and she feels it works for a few months and wears off. She has been to PT before for her hip pain and they said she has a leg length discrepancy. She has anterior hip pain that is newer, but it was alleviate after she got a shot.Standing tolerance in unaffected with hip pain, walking tolerance is  limited to grocery shopping distance, her sitting is tolerance is limited when having to travel far. Negotiating stairs is painful. Denies numbness and tingling. Patient has been out of the gym due to the hip pain, but she tries to be consistent.   PERTINENT HISTORY: Anemia; Anxiety; Depression; type 1 diabetes; HTN;  PAIN: 02/07/24 Are you having pain? Yes: NPRS scale: 2-3/10 Pain location: lateral left hip joint Pain description: achy; sharp Aggravating factors: walking & sitting/ laying on that side; stairs Relieving factors: Nothing  PRECAUTIONS: None  RED FLAGS: None   WEIGHT BEARING RESTRICTIONS: No  FALLS:  Has patient fallen in last 6 months? No  LIVING ENVIRONMENT: Lives with: lives with their family Lives in: House/apartment Stairs: Yes: External: 3 steps; on left going up and patient has a ramp   OCCUPATION: Disabled  PLOF: Independent, Independent with basic ADLs, Independent with gait,  and Independent with transfers  PATIENT GOALS: Long lasting relief   NEXT MD VISIT: April 22  OBJECTIVE:  Note: Objective measures were completed at Evaluation unless otherwise noted.  DIAGNOSTIC FINDINGS: Lt hip MR with contrast 07/19/2023 MPRESSION: 1. Mild asymmetric gluteus minimus tendinosis on the left. No evidence of tendon tear. 2. No acute osseous findings or significant arthropathic changes. 3. No evidence of labral tear.    PATIENT SURVEYS:  LEFS 59/80 73%  COGNITION: Overall cognitive status: Within functional limits for tasks assessed     SENSATION: WFL  MUSCLE LENGTH: Hamstrings: decreased bilateral Rt > Lt   POSTURE: rounded shoulders  PALPATION: Tenderness over Lt greater trochanter Faber + on Lt  Tenderness and increased muscle spasms bilateral piriformis  Obers (+) bilateral    LOWER EXTREMITY ROM: WFL PROM ROM  besides hip IR limited bilateral ; pain with Lt hip flexion PROM   LOWER EXTREMITY MMT:* pain  MMT Right eval Left eval   Hip flexion 5 4+*  Hip extension    Hip abduction 4 4  Hip adduction    Hip internal rotation    Hip external rotation    Knee flexion 5 5  Knee extension 5 4+*  Ankle dorsiflexion    Ankle plantarflexion    Ankle inversion    Ankle eversion     (Blank rows = not tested)   FUNCTIONAL TESTS:  5 times sit to stand: 9.24 no UE support  GAIT: Comments: Unremarkable                                                                                                                    TREATMENT DATE:  02/07/2024  Recumbent Bike Level 4 6 mins PT present to discuss progress Seated hamstring stretch 3x20 sec bilateral  Seated figure 4 stretch 3x20 sec bilateral  Hip flexion stretch in supine 2x20 seconds Bridge + march x 10 Hinging technique at wall with 10# DB x 10 Trigger Point Dry Needling  Subsequent Treatment: Instructions provided previously at initial dry needling treatment.   Patient Verbal Consent Given: Yes Education Handout Provided: Previously Provided Muscles Treated: Lt glutes; Lt lateral quads Electrical Stimulation Performed: No Treatment Response/Outcome: Utilized skilled palpation to identify trigger points.  During dry needling able to palpate muscle twitch and muscle elongation.   Manual Soft tissue techniques performed after dry needling   02/01/2024  NuStep: Level 5  x 6 minutes-PT present to discuss progress Seated hamstring stretch 3x20 sec bilateral  Seated figure 4 stretch 3x20 sec bilateral  Hip flexion stretch in supine 2x20 seconds Bridge 2 x 10 SL hip abduction + purple ball press x 12 bilateral  Side stepping with red loop x 3 laps Monster walks with red loop x 3 laps   Hinging with dowel as tactile cue & mirror x 10 Hinging no dowel x 10 Deadlift 5# db with cone infront for target 2 x 10  March holding 10# KB shoulder height 2 x 20  01/30/2024  NuStep: Level 4x 6 minutes-PT present to discuss progress Seated hamstring stretch 3x20  sec Seated figure 4 stretch 3x20 sec Sit to stand: 10# kettlebell 2x10 Hip flexion stretch in supine 2x20 seconds  Sidelying clam and reverse clam x10  Trigger Point Dry Needling  Initial Treatment: Pt instructed on Dry Needling rational, procedures, and possible side effects. Pt instructed to expect mild to moderate muscle soreness later in the day and/or into the next day.  Pt instructed in methods to reduce muscle soreness. Pt instructed to continue prescribed HEP. Patient was educated on signs and symptoms of infection and other risk factors and advised to seek medical attention should they occur.  Patient verbalized understanding of these instructions and education.   Patient Verbal Consent Given: Yes Education Handout Provided: Previously Provided Muscles Treated: Lt gluteals, ITB and lateral quads  Electrical Stimulation Performed: No Treatment Response/Outcome: twitch response  Manual: elongation and release to Lt gluteals after dry needling.        PATIENT EDUCATION:  Education details: POC; examination findings; DN handout Person educated: Patient Education method: Explanation, Demonstration, and Handouts Education comprehension: verbalized understanding, returned demonstration, and needs further education  HOME EXERCISE PROGRAM: Access Code: [facility] URL: [email or web address] Date: 01/30/2024 Prepared by: Loetta Ringer  Exercises - Hip Flexion Stretch  - 1 x daily - 7 x weekly - 2 sets - 20-30 hold - Supine Hip Internal and External Rotation  - 1 x daily - 7 x weekly - 1 sets - 10 reps - Seated Hamstring Stretch  - 1 x daily - 7 x weekly - 2 sets - 20-30 hold - Seated Piriformis Stretch with Trunk Bend  - 1 x daily - 7 x weekly - 2 sets - 20-30 hold - Sit to Stand Without Arm Support  - 2 x daily - 7 x weekly - 2 sets - 10 reps - Clamshell  - 2 x daily - 7 x weekly - 2 sets - 10 reps - Sidelying Reverse Clamshell  - 2 x daily - 7 x weekly - 2 sets - 10  reps  ASSESSMENT: Patient presents with minimal left hip pain today. She has been having increased knee pain with deadlifts exercise. Demonstrated proper deadlift technique and used wall as a tactile cue for hip hinging. Patient tolerated treatment session well and did not verbalize any increased pain or discomfort. Dry needling was successful at muscle elongation. Educated patient to stretch and stay hydrated the rest of the day. Patient will benefit from skilled PT to address the below impairments and improve overall function.     OBJECTIVE IMPAIRMENTS: decreased activity tolerance, difficulty walking, decreased ROM, decreased strength, hypomobility, increased muscle spasms, impaired flexibility, postural dysfunction, and pain.   ACTIVITY LIMITATIONS: sitting, squatting, stairs, and transfers  PARTICIPATION LIMITATIONS: cleaning, laundry, driving, shopping, and community activity  PERSONAL FACTORS: Fitness, Time since onset of injury/illness/exacerbation, and 3+ comorbidities: Anemia; Anxiety; Depression; type 1 diabetes; HTN;   are also affecting patient's functional outcome.   REHAB POTENTIAL: Good  CLINICAL DECISION MAKING: Stable/uncomplicated  EVALUATION COMPLEXITY: Low   GOALS: Goals reviewed with patient? Yes  SHORT TERM GOALS: Target date: 02/15/2024  Patient will be independent with initial HEP. Baseline:  Goal status: INITIAL  2.  Patient will report > or = to 30% improvement in left hip pain since starting PT. Baseline:  Goal status: INITIAL   3.  Patient will be able to walk for > or = to 15 minutes before onset of Lt hip  pain. Baseline: 10 mins Goal status: INITIAL    LONG TERM GOALS: Target date: 03/14/2024  Patient will demonstrate independence in advanced HEP. Baseline:  Goal status: INITIAL  2.  Patient will report > or = to 70% improvement in Lt hip pain since starting PT. Baseline:  Goal status: INITIAL  3.  Patient will verbalize and demonstrate  self-care strategies to manage pain including tissue mobility practices and change of position. Baseline:  Goal status: INITIAL   4.  Patient will be able to return to regular exercise regimen with proper form. Baseline:  Goal status: INITIAL  5.  Patient will be able to negotiate stairs with reciprocal pattern with < or = to 2/10 hip pain.  Baseline:  Goal status: INITIAL  6.  Patient will score > or = to 68/80 on LEFS for improved ability to perform ADLs. Baseline: 59/80 Goal status: INITIAL   PLAN:  PT FREQUENCY: 2x/week  PT DURATION: 8 weeks  PLANNED INTERVENTIONS: [id number]- PT Re-evaluation, 97110-Therapeutic exercises, 97530- Therapeutic activity, [id number]- Neuromuscular re-education, 97535- Self Care, [id number]- Manual therapy, [id number]- Gait training, [phone]- Canalith repositioning, [id number]- Aquatic Therapy, [phone]- Electrical stimulation (unattended), [phone]- Electrical stimulation (manual), [id number]- Vasopneumatic device, [id number]- Ultrasound, [id number]- Traction (mechanical), [id number]- Ionotophoresis 4mg /ml Dexamethasone, Balance training, Stair training, Taping, Dry Needling, Joint mobilization, Joint manipulation, Spinal manipulation, Spinal mobilization, Vestibular training, Cryotherapy, and Moist heat  PLAN FOR NEXT SESSION: assess tolerance DN #2;  hip strength and stability ; manual/ DN as indicated   Penelope Bowie, PT 02/07/24 12:49 PM

## 2024-02-13 ENCOUNTER — Ambulatory Visit: Admitting: Physical Therapy

## 2024-02-15 ENCOUNTER — Encounter: Payer: Self-pay | Admitting: Physical Therapy

## 2024-02-15 ENCOUNTER — Ambulatory Visit: Admitting: Physical Therapy

## 2024-02-15 DIAGNOSIS — R252 Cramp and spasm: Secondary | ICD-10-CM

## 2024-02-15 DIAGNOSIS — M6281 Muscle weakness (generalized): Secondary | ICD-10-CM

## 2024-02-15 DIAGNOSIS — M25552 Pain in left hip: Secondary | ICD-10-CM | POA: Diagnosis not present

## 2024-02-15 NOTE — Therapy (Signed)
 OUTPATIENT PHYSICAL THERAPY LOWER EXTREMITY TREATMENT   Patient Name: Nylani Rochell MRN: [id number] DOB:11-20-1985, 38 y.o., female Today's Date: 02/15/2024  END OF SESSION:  PT End of Session - 02/15/24 1015     Visit Number 5    Date for PT Re-Evaluation 03/14/24    Authorization Type Medicare/ Medicaid    Progress Note Due on Visit 10    PT Start Time 0939    PT Stop Time 1012    PT Time Calculation (min) 33 min    Activity Tolerance Patient tolerated treatment well    Behavior During Therapy [facility] for tasks assessed/performed                 Past Medical History:  Diagnosis Date   Anemia    Anemia of chronic disease 06/07/2012   Formatting of this note might be different from the original. Hematology consultation 07-13-16 (see note)--> Anemia of chronic disease Required 3 transfusions during pregnancy  Last Assessment & Plan:  Formatting of this note might be different from the original. She has now required 3 blood transfusions this pregnancy for her anemia of chronic disease. The most recent was one week ago when she bec   Anxiety    Asthma    Asthma 11/10/2013   Back pain    Bipolar 1 disorder (HCC)    Chest pain    Chronic renal failure syndrome, stage 3 (moderate) (HCC)    Colles' fracture of left radius 10/11/2013   Complication of anesthesia    Constipation    Depression    Depression    Diabetic gastroparesis (HCC)    Diabetic neuropathy, type I diabetes mellitus (HCC)    Diabetic ulcer of right great toe (HCC) 08/27/2020   Edema, lower extremity    Gallbladder disease    Gastroparesis    GERD (gastroesophageal reflux disease)    Headache(784.0)    Hypertension    Joint pain    Kidney stones    Palpitations    Polyneuropathy in diabetes(357.2)    PONV (postoperative nausea and vomiting)    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)    S/P carpal tunnel release left 10/16/19 11/04/2019   Shortness of breath    Tachycardia    baseline  tachycardia    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)    Past Surgical History:  Procedure Laterality Date   ANAL RECTAL MANOMETRY N/A 03/25/2018   Procedure: ANO RECTAL MANOMETRY;  Surgeon: Albertina Hugger, MD;  Location: [facility];  Service: Gastroenterology;  Laterality: N/A;   CARPAL TUNNEL RELEASE Left 10/16/2019   Procedure: LEFT CARPAL TUNNEL RELEASE;  Surgeon: Darrin Emerald, MD;  Location: [facility];  Service: Orthopedics;  Laterality: Left;   CESAREAN SECTION     x 2   CHOLECYSTECTOMY     EYE SURGERY     REFRACTIVE SURGERY Bilateral    Patient Active Problem List   Diagnosis Date Noted   Bipolar disorder, current episode depressed, severe, without psychotic features (HCC) 07/10/2023   Major depressive disorder, recurrent severe without psychotic features (HCC) 07/09/2023   Chronic left hip pain 07/03/2023   Overactive bladder 02/07/2022   Recurrent cold sores 02/02/2021   Iron deficiency anemia 12/19/2020   Long term (current) use of insulin  (HCC) 12/19/2020   Migraine 11/15/2020   CKD stage 3 secondary to diabetes (HCC) 11/15/2020   Diabetic neuropathy (HCC) 11/15/2020   Type 1 diabetes mellitus with stage 3b  chronic kidney disease (HCC) 08/27/2020   Polyphagia 08/27/2020   Bipolar 1 disorder, mixed, severe (HCC) 08/27/2020   Type 1 diabetes mellitus with hyperglycemia (HCC) 08/27/2020   Financial difficulties 08/27/2020   Drug-induced weight gain 08/27/2020   Traction detachment of right retina 08/27/2020   Vitreous hemorrhage of left eye (HCC) 08/27/2020   Hypertension associated with diabetes (HCC) 08/27/2020   Type 1 diabetes mellitus with diabetic polyneuropathy 08/27/2020   Class 1 obesity with serious comorbidity and body mass index (BMI) of 33.0 to 33.9 in adult 08/27/2020   Status post eye surgery 07/01/2020   Chronic migraine without aura 01/22/2020   S/P eye surgery 08/29/2019   Diplopia 07/23/2019   Epiretinal  membrane (ERM) of left eye 07/23/2019   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/2019   Pseudophakia of left eye 11/04/2018   Chronic kidney disease, stage 3b (HCC) 01/01/2017   Diabetic nephropathy associated with type 1 diabetes mellitus (HCC) 03/01/2016   After-cataract obscuring vision, left 09/22/2014   Drug overdose, intentional (HCC) 03/27/2014   Asthma 11/10/2013   MDD (major depressive disorder), recurrent episode, severe (HCC) 09/26/2013   Generalized anxiety disorder 09/26/2013   Aphakia, right eye 09/14/2012   Retinal detachment, tractional, left eye 06/11/2012   Eczema 06/07/2012   Diabetic retinopathy associated with type 1 diabetes mellitus (HCC) 04/30/2012   H/O insertion of insulin  pump 04/30/2012   History of atrial tachycardia 08/08/2010   Gastroparesis due to DM (HCC) 08/08/2010    PCP: Austine Lefort, MD   REFERRING PROVIDER: Ulysees Gander, DO  REFERRING DIAG: [phone] (ICD-10-CM) - Left hip pain S76.012D (ICD-10-CM) - Muscle strain of gluteal region, left, subsequent encounter M70.62 (ICD-10-CM) - Greater trochanteric bursitis of left hip  THERAPY DIAG:  Pain in left hip  Cramp and spasm  Muscle weakness (generalized)  Rationale for Evaluation and Treatment: Rehabilitation  ONSET DATE: Chronic with most recent flare up 1 year ago  SUBJECTIVE:   SUBJECTIVE STATEMENT: Patient reports she is doing good today. She is not currently having any pain. DN provided pain reliefs. She has been having increased bilateral knee pain after standing for 10-15 mins.  From Eval: Patient presents with chronic left hip pain that has progressively gotten worse over the last year. She got an injection a month ago and she feels it works for a few months and wears off. She has been to PT before for her hip pain and they said she has a leg length discrepancy. She has anterior hip pain that is newer, but it was alleviate after she got a  shot.Standing tolerance in unaffected with hip pain, walking tolerance is limited to grocery shopping distance, her sitting is tolerance is limited when having to travel far. Negotiating stairs is painful. Denies numbness and tingling. Patient has been out of the gym due to the hip pain, but she tries to be consistent.   PERTINENT HISTORY: Anemia; Anxiety; Depression; type 1 diabetes; HTN;  PAIN: 02/07/24 Are you having pain? Yes: NPRS scale: 2-3/10 Pain location: lateral left hip joint Pain description: achy; sharp Aggravating factors: walking & sitting/ laying on that side; stairs Relieving factors: Nothing  PRECAUTIONS: None  RED FLAGS: None   WEIGHT BEARING RESTRICTIONS: No  FALLS:  Has patient fallen in last 6 months? No  LIVING ENVIRONMENT: Lives with: lives with their family Lives in: House/apartment Stairs: Yes: External: 3 steps; on left going up and patient has a ramp   OCCUPATION: Disabled  PLOF: Independent, Independent with basic ADLs, Independent with gait, and Independent with transfers  PATIENT GOALS: Long lasting relief   NEXT MD VISIT: April 22  OBJECTIVE:  Note: Objective measures were completed at Evaluation unless otherwise noted.  DIAGNOSTIC FINDINGS: Lt hip MR with contrast 07/19/2023 MPRESSION: 1. Mild asymmetric gluteus minimus tendinosis on the left. No evidence of tendon tear. 2. No acute osseous findings or significant arthropathic changes. 3. No evidence of labral tear.    PATIENT SURVEYS:  LEFS 59/80 73%  COGNITION: Overall cognitive status: Within functional limits for tasks assessed     SENSATION: WFL  MUSCLE LENGTH: Hamstrings: decreased bilateral Rt > Lt   POSTURE: rounded shoulders  PALPATION: Tenderness over Lt greater trochanter Faber + on Lt  Tenderness and increased muscle spasms bilateral piriformis  Obers (+) bilateral    LOWER EXTREMITY ROM: WFL PROM ROM  besides hip IR limited bilateral ; pain with Lt hip  flexion PROM   LOWER EXTREMITY MMT:* pain  MMT Right eval Left eval  Hip flexion 5 4+*  Hip extension    Hip abduction 4 4  Hip adduction    Hip internal rotation    Hip external rotation    Knee flexion 5 5  Knee extension 5 4+*  Ankle dorsiflexion    Ankle plantarflexion    Ankle inversion    Ankle eversion     (Blank rows = not tested)   FUNCTIONAL TESTS:  5 times sit to stand: 9.24 no UE support  GAIT: Comments: Unremarkable                                                                                                                    TREATMENT DATE:  02/15/2024  Recumbent Bike Level 3 5 mins PT present to discuss progress Hinging at wall x 10 Hinging without wall x 10 Discussion on knee position when standing Side stepping with red loop x 3 laps Monster walks with red loop x 3 laps Standing hip abduction with red loop (knee bent) x 10 bilateral  6 inch step up + knee drive holding 16# KB x 10 bilateral  Sit to stand holding 10# KB 2 x 10 Sidelying reverse clamshells 2 x 10 Bridge + march x 10    02/07/2024  Recumbent Bike Level 4 6 mins PT present to discuss progress Seated hamstring stretch 3x20 sec bilateral  Seated figure 4 stretch 3x20 sec bilateral  Hip flexion stretch in supine 2x20 seconds Bridge + march x 10 Hinging technique at wall with 10# DB x 10 Trigger Point Dry Needling  Subsequent Treatment: Instructions provided previously at initial dry needling treatment.   Patient Verbal Consent Given: Yes Education Handout Provided: Previously Provided Muscles Treated: Lt glutes; Lt lateral quads Electrical Stimulation Performed: No Treatment Response/Outcome: Utilized skilled palpation to identify trigger points.  During dry needling able to palpate muscle twitch and muscle elongation.   Manual Soft tissue techniques performed after dry needling   02/01/2024  NuStep: Level 5  x 6 minutes-PT present to discuss progress Seated hamstring  stretch 3x20 sec bilateral  Seated figure 4 stretch 3x20 sec bilateral  Hip flexion stretch in supine 2x20 seconds Bridge 2 x 10 SL hip abduction + purple ball press x 12 bilateral  Side stepping with red loop x 3 laps Monster walks with red loop x 3 laps   Hinging with dowel as tactile cue & mirror x 10 Hinging no dowel x 10 Deadlift 5# db with cone infront for target 2 x 10  March holding 10# KB shoulder height 2 x 20         PATIENT EDUCATION:  Education details: POC; examination findings; DN handout Person educated: Patient Education method: Explanation, Demonstration, and Handouts Education comprehension: verbalized understanding, returned demonstration, and needs further education  HOME EXERCISE PROGRAM: Access Code: [facility] URL: [email or web address] Date: 01/30/2024 Prepared by: Loetta Ringer  Exercises - Hip Flexion Stretch  - 1 x daily - 7 x weekly - 2 sets - 20-30 hold - Supine Hip Internal and External Rotation  - 1 x daily - 7 x weekly - 1 sets - 10 reps - Seated Hamstring Stretch  - 1 x daily - 7 x weekly - 2 sets - 20-30 hold - Seated Piriformis Stretch with Trunk Bend  - 1 x daily - 7 x weekly - 2 sets - 20-30 hold - Sit to Stand Without Arm Support  - 2 x daily - 7 x weekly - 2 sets - 10 reps - Clamshell  - 2 x daily - 7 x weekly - 2 sets - 10 reps - Sidelying Reverse Clamshell  - 2 x daily - 7 x weekly - 2 sets - 10 reps  ASSESSMENT: Patient presents with no left hip pain today. Dry needling last session was successful at alleviating her pain. She verbalized having increased bilateral knee pain when she standing for longer than 10-15 minutes. Discussed proper knee alignment in standing and educated patient to make sure she is not hyperextending her knees. Will reassess this next treatment session. Overall, she tolerated treatment session well and required verbal and visual cues for core. Patient will benefit from skilled PT to address the below  impairments and improve overall function.      OBJECTIVE IMPAIRMENTS: decreased activity tolerance, difficulty walking, decreased ROM, decreased strength, hypomobility, increased muscle spasms, impaired flexibility, postural dysfunction, and pain.   ACTIVITY LIMITATIONS: sitting, squatting, stairs, and transfers  PARTICIPATION LIMITATIONS: cleaning, laundry, driving, shopping, and community activity  PERSONAL FACTORS: Fitness, Time since onset of injury/illness/exacerbation, and 3+ comorbidities: Anemia; Anxiety; Depression; type 1 diabetes; HTN;  are also affecting patient's functional outcome.   REHAB POTENTIAL: Good  CLINICAL DECISION MAKING: Stable/uncomplicated  EVALUATION COMPLEXITY: Low   GOALS: Goals reviewed with patient? Yes  SHORT TERM GOALS: Target date: 02/15/2024  Patient will be independent with initial HEP. Baseline:  Goal status: INITIAL  2.  Patient will report > or = to 30% improvement in left hip pain since starting PT. Baseline:  Goal status: INITIAL   3.  Patient will be able to walk for > or = to 15 minutes before onset of Lt hip pain. Baseline: 10 mins Goal status: INITIAL    LONG TERM GOALS: Target date: 03/14/2024  Patient will demonstrate independence in advanced HEP. Baseline:  Goal status: INITIAL  2.  Patient will report > or = to 70% improvement in Lt hip pain since starting PT. Baseline:  Goal status: INITIAL  3.  Patient will verbalize and  demonstrate self-care strategies to manage pain including tissue mobility practices and change of position. Baseline:  Goal status: INITIAL   4.  Patient will be able to return to regular exercise regimen with proper form. Baseline:  Goal status: INITIAL  5.  Patient will be able to negotiate stairs with reciprocal pattern with < or = to 2/10 hip pain.  Baseline:  Goal status: INITIAL  6.  Patient will score > or = to 68/80 on LEFS for improved ability to perform ADLs. Baseline:  59/80 Goal status: INITIAL   PLAN:  PT FREQUENCY: 2x/week  PT DURATION: 8 weeks  PLANNED INTERVENTIONS: [id number]- PT Re-evaluation, 97110-Therapeutic exercises, 97530- Therapeutic activity, [id number]- Neuromuscular re-education, 97535- Self Care, [id number]- Manual therapy, [id number]- Gait training, [id number]- Canalith repositioning, [id number]- Aquatic Therapy, [phone]- Electrical stimulation (unattended), [phone]- Electrical stimulation (manual), [id number]- Vasopneumatic device, [id number]- Ultrasound, [id number]- Traction (mechanical), [id number]- Ionotophoresis 4mg /ml Dexamethasone, Balance training, Stair training, Taping, Dry Needling, Joint mobilization, Joint manipulation, Spinal manipulation, Spinal mobilization, Vestibular training, Cryotherapy, and Moist heat  PLAN FOR NEXT SESSION: assess knee pain; continue hip strength and stability ; manual/ DN as indicated   Penelope Bowie, PT 02/15/24 10:15 AM

## 2024-02-18 ENCOUNTER — Ambulatory Visit: Admitting: Professional Counselor

## 2024-02-19 ENCOUNTER — Ambulatory Visit: Admitting: Physical Therapy

## 2024-02-21 ENCOUNTER — Ambulatory Visit (INDEPENDENT_AMBULATORY_CARE_PROVIDER_SITE_OTHER): Admitting: Psychiatry

## 2024-02-22 ENCOUNTER — Ambulatory Visit: Admitting: Physical Therapy

## 2024-02-22 ENCOUNTER — Encounter: Payer: Self-pay | Admitting: Physical Therapy

## 2024-02-22 DIAGNOSIS — M6281 Muscle weakness (generalized): Secondary | ICD-10-CM

## 2024-02-22 DIAGNOSIS — M25552 Pain in left hip: Secondary | ICD-10-CM | POA: Diagnosis not present

## 2024-02-22 DIAGNOSIS — R252 Cramp and spasm: Secondary | ICD-10-CM

## 2024-02-22 NOTE — Therapy (Signed)
 OUTPATIENT PHYSICAL THERAPY LOWER EXTREMITY TREATMENT   Patient Name: Zamarah Quist MRN: [id number] DOB:May 25, 1986, 38 y.o., female Today's Date: 02/22/2024  END OF SESSION:  PT End of Session - 02/22/24 1135     Visit Number 6    Date for PT Re-Evaluation 03/14/24    Authorization Type Medicare/ Medicaid    Progress Note Due on Visit 10    PT Start Time [phone]    PT Stop Time 1015    PT Time Calculation (min) 39 min    Activity Tolerance Patient tolerated treatment well    Behavior During Therapy [facility] for tasks assessed/performed                  Past Medical History:  Diagnosis Date   Anemia    Anemia of chronic disease 06/07/2012   Formatting of this note might be different from the original. Hematology consultation 07-13-16 (see note)--> Anemia of chronic disease Required 3 transfusions during pregnancy  Last Assessment & Plan:  Formatting of this note might be different from the original. She has now required 3 blood transfusions this pregnancy for her anemia of chronic disease. The most recent was one week ago when she bec   Anxiety    Asthma    Asthma 11/10/2013   Back pain    Bipolar 1 disorder (HCC)    Chest pain    Chronic renal failure syndrome, stage 3 (moderate) (HCC)    Colles' fracture of left radius 10/11/2013   Complication of anesthesia    Constipation    Depression    Depression    Diabetic gastroparesis (HCC)    Diabetic neuropathy, type I diabetes mellitus (HCC)    Diabetic ulcer of right great toe (HCC) 08/27/2020   Edema, lower extremity    Gallbladder disease    Gastroparesis    GERD (gastroesophageal reflux disease)    Headache(784.0)    Hypertension    Joint pain    Kidney stones    Palpitations    Polyneuropathy in diabetes(357.2)    PONV (postoperative nausea and vomiting)    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)    S/P carpal tunnel release left 10/16/19 11/04/2019   Shortness of breath    Tachycardia    baseline  tachycardia    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)    Past Surgical History:  Procedure Laterality Date   ANAL RECTAL MANOMETRY N/A 03/25/2018   Procedure: ANO RECTAL MANOMETRY;  Surgeon: Albertina Hugger, MD;  Location: [facility];  Service: Gastroenterology;  Laterality: N/A;   CARPAL TUNNEL RELEASE Left 10/16/2019   Procedure: LEFT CARPAL TUNNEL RELEASE;  Surgeon: Darrin Emerald, MD;  Location: [facility];  Service: Orthopedics;  Laterality: Left;   CESAREAN SECTION     x 2   CHOLECYSTECTOMY     EYE SURGERY     REFRACTIVE SURGERY Bilateral    Patient Active Problem List   Diagnosis Date Noted   Bipolar disorder, current episode depressed, severe, without psychotic features (HCC) 07/10/2023   Major depressive disorder, recurrent severe without psychotic features (HCC) 07/09/2023   Chronic left hip pain 07/03/2023   Overactive bladder 02/07/2022   Recurrent cold sores 02/02/2021   Iron deficiency anemia 12/19/2020   Long term (current) use of insulin  (HCC) 12/19/2020   Migraine 11/15/2020   CKD stage 3 secondary to diabetes (HCC) 11/15/2020   Diabetic neuropathy (HCC) 11/15/2020   Type 1 diabetes mellitus with stage  3b chronic kidney disease (HCC) 08/27/2020   Polyphagia 08/27/2020   Bipolar 1 disorder, mixed, severe (HCC) 08/27/2020   Type 1 diabetes mellitus with hyperglycemia (HCC) 08/27/2020   Financial difficulties 08/27/2020   Drug-induced weight gain 08/27/2020   Traction detachment of right retina 08/27/2020   Vitreous hemorrhage of left eye (HCC) 08/27/2020   Hypertension associated with diabetes (HCC) 08/27/2020   Type 1 diabetes mellitus with diabetic polyneuropathy 08/27/2020   Class 1 obesity with serious comorbidity and body mass index (BMI) of 33.0 to 33.9 in adult 08/27/2020   Status post eye surgery 07/01/2020   Chronic migraine without aura 01/22/2020   S/P eye surgery 08/29/2019   Diplopia 07/23/2019   Epiretinal  membrane (ERM) of left eye 07/23/2019   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/2019   Pseudophakia of left eye 11/04/2018   Chronic kidney disease, stage 3b (HCC) 01/01/2017   Diabetic nephropathy associated with type 1 diabetes mellitus (HCC) 03/01/2016   After-cataract obscuring vision, left 09/22/2014   Drug overdose, intentional (HCC) 03/27/2014   Asthma 11/10/2013   MDD (major depressive disorder), recurrent episode, severe (HCC) 09/26/2013   Generalized anxiety disorder 09/26/2013   Aphakia, right eye 09/14/2012   Retinal detachment, tractional, left eye 06/11/2012   Eczema 06/07/2012   Diabetic retinopathy associated with type 1 diabetes mellitus (HCC) 04/30/2012   H/O insertion of insulin  pump 04/30/2012   History of atrial tachycardia 08/08/2010   Gastroparesis due to DM (HCC) 08/08/2010    PCP: Austine Lefort, MD   REFERRING PROVIDER: Ulysees Gander, DO  REFERRING DIAG: [phone] (ICD-10-CM) - Left hip pain S76.012D (ICD-10-CM) - Muscle strain of gluteal region, left, subsequent encounter M70.62 (ICD-10-CM) - Greater trochanteric bursitis of left hip  THERAPY DIAG:  Pain in left hip  Cramp and spasm  Muscle weakness (generalized)  Rationale for Evaluation and Treatment: Rehabilitation  ONSET DATE: Chronic with most recent flare up 1 year ago  SUBJECTIVE:   SUBJECTIVE STATEMENT: Patient reports she is really painful today. She did a lot of remodeling this week and she has not rested any. Sitting in the car rider line for 20 minutes is very challenging due to pain.    From Eval: Patient presents with chronic left hip pain that has progressively gotten worse over the last year. She got an injection a month ago and she feels it works for a few months and wears off. She has been to PT before for her hip pain and they said she has a leg length discrepancy. She has anterior hip pain that is newer, but it was alleviate after she  got a shot.Standing tolerance in unaffected with hip pain, walking tolerance is limited to grocery shopping distance, her sitting is tolerance is limited when having to travel far. Negotiating stairs is painful. Denies numbness and tingling. Patient has been out of the gym due to the hip pain, but she tries to be consistent.   PERTINENT HISTORY: Anemia; Anxiety; Depression; type 1 diabetes; HTN;  PAIN: 02/22/24 Are you having pain? Yes: NPRS scale: 6/10 Pain location: lateral left hip joint Pain description: achy; sharp Aggravating factors: walking & sitting/ laying on that side; stairs Relieving factors: Nothing  PRECAUTIONS: None  RED FLAGS: None   WEIGHT BEARING RESTRICTIONS: No  FALLS:  Has patient fallen in last 6 months? No  LIVING ENVIRONMENT: Lives with: lives with their family Lives in: House/apartment Stairs: Yes: External: 3 steps; on left going up and  patient has a ramp   OCCUPATION: Disabled  PLOF: Independent, Independent with basic ADLs, Independent with gait, and Independent with transfers  PATIENT GOALS: Long lasting relief   NEXT MD VISIT: April 22  OBJECTIVE:  Note: Objective measures were completed at Evaluation unless otherwise noted.  DIAGNOSTIC FINDINGS: Lt hip MR with contrast 07/19/2023 MPRESSION: 1. Mild asymmetric gluteus minimus tendinosis on the left. No evidence of tendon tear. 2. No acute osseous findings or significant arthropathic changes. 3. No evidence of labral tear.    PATIENT SURVEYS:  LEFS 59/80 73%  COGNITION: Overall cognitive status: Within functional limits for tasks assessed     SENSATION: WFL  MUSCLE LENGTH: Hamstrings: decreased bilateral Rt > Lt   POSTURE: rounded shoulders  PALPATION: Tenderness over Lt greater trochanter Faber + on Lt  Tenderness and increased muscle spasms bilateral piriformis  Obers (+) bilateral    LOWER EXTREMITY ROM: WFL PROM ROM  besides hip IR limited bilateral ; pain with Lt hip  flexion PROM   LOWER EXTREMITY MMT:* pain  MMT Right eval Left eval  Hip flexion 5 4+*  Hip extension    Hip abduction 4 4  Hip adduction    Hip internal rotation    Hip external rotation    Knee flexion 5 5  Knee extension 5 4+*  Ankle dorsiflexion    Ankle plantarflexion    Ankle inversion    Ankle eversion     (Blank rows = not tested)   FUNCTIONAL TESTS:  5 times sit to stand: 9.24 no UE support  GAIT: Comments: Unremarkable                                                                                                                    TREATMENT DATE:  02/22/2024  Recumbent Bike Level 3 5 mins PT present to discuss progress Hamstring stretch at stair 2 x 30 bilateral  Quad stretch at stair x 30 sec bilateral  Seated figure 4 stretch 2x20 sec bilateral  Hip internal / external stretch 4 x 10 sec holds  Trigger Point Dry Needling  Subsequent Treatment: Instructions provided previously at initial dry needling treatment.   Patient Verbal Consent Given: Yes Education Handout Provided: Previously Provided Muscles Treated: Lt hamstrings; adductors; and glutes + piriformis  Electrical Stimulation Performed: No Treatment Response/Outcome: Utilized skilled palpation to identify trigger points.  During dry needling able to palpate muscle twitch and muscle elongation   Seated adductor stretch on Lt x 30 sec     02/15/2024  Recumbent Bike Level 3 5 mins PT present to discuss progress Hinging at wall x 10 Hinging without wall x 10 Discussion on knee position when standing Side stepping with red loop x 3 laps Monster walks with red loop x 3 laps Standing hip abduction with red loop (knee bent) x 10 bilateral  6 inch step up + knee drive holding 09# KB x 10 bilateral  Sit to stand holding 10# KB 2 x 10 Sidelying reverse clamshells 2  x 10 Bridge + march x 10    02/07/2024  Recumbent Bike Level 4 6 mins PT present to discuss progress Seated hamstring stretch 3x20  sec bilateral  Seated figure 4 stretch 3x20 sec bilateral  Hip flexion stretch in supine 2x20 seconds Bridge + march x 10 Hinging technique at wall with 10# DB x 10 Trigger Point Dry Needling  Subsequent Treatment: Instructions provided previously at initial dry needling treatment.   Patient Verbal Consent Given: Yes Education Handout Provided: Previously Provided Muscles Treated: Lt glutes; Lt lateral quads Electrical Stimulation Performed: No Treatment Response/Outcome: Utilized skilled palpation to identify trigger points.  During dry needling able to palpate muscle twitch and muscle elongation.   Manual Soft tissue techniques performed after dry needling       PATIENT EDUCATION:  Education details: POC; examination findings; DN handout Person educated: Patient Education method: Explanation, Demonstration, and Handouts Education comprehension: verbalized understanding, returned demonstration, and needs further education  HOME EXERCISE PROGRAM: Access Code: [facility] URL: [email or web address] Date: 01/30/2024 Prepared by: Loetta Ringer  Exercises - Hip Flexion Stretch  - 1 x daily - 7 x weekly - 2 sets - 20-30 hold - Supine Hip Internal and External Rotation  - 1 x daily - 7 x weekly - 1 sets - 10 reps - Seated Hamstring Stretch  - 1 x daily - 7 x weekly - 2 sets - 20-30 hold - Seated Piriformis Stretch with Trunk Bend  - 1 x daily - 7 x weekly - 2 sets - 20-30 hold - Sit to Stand Without Arm Support  - 2 x daily - 7 x weekly - 2 sets - 10 reps - Clamshell  - 2 x daily - 7 x weekly - 2 sets - 10 reps - Sidelying Reverse Clamshell  - 2 x daily - 7 x weekly - 2 sets - 10 reps  ASSESSMENT: Patient presents with increased left hip pain today. She has been remodeling her house and she has not been resting much in between projects. Dry needling was successful at elongating left hamstring, gluteal, and adductor muscles. Educated patient  on stretches to perform for adductor  flexibility. Patient plans to do more remodeling this weekend educated her to set a timer for 2 hours and take a rest when it goes off. Patient will benefit from skilled PT to address the below impairments and improve overall function.      OBJECTIVE IMPAIRMENTS: decreased activity tolerance, difficulty walking, decreased ROM, decreased strength, hypomobility, increased muscle spasms, impaired flexibility, postural dysfunction, and pain.   ACTIVITY LIMITATIONS: sitting, squatting, stairs, and transfers  PARTICIPATION LIMITATIONS: cleaning, laundry, driving, shopping, and community activity  PERSONAL FACTORS: Fitness, Time since onset of injury/illness/exacerbation, and 3+ comorbidities: Anemia; Anxiety; Depression; type 1 diabetes; HTN;  are also affecting patient's functional outcome.   REHAB POTENTIAL: Good  CLINICAL DECISION MAKING: Stable/uncomplicated  EVALUATION COMPLEXITY: Low   GOALS: Goals reviewed with patient? Yes  SHORT TERM GOALS: Target date: 02/15/2024  Patient will be independent with initial HEP. Baseline:  Goal status: INITIAL  2.  Patient will report > or = to 30% improvement in left hip pain since starting PT. Baseline:  Goal status: INITIAL   3.  Patient will be able to walk for > or = to 15 minutes before onset of Lt hip pain. Baseline: 10 mins Goal status: INITIAL    LONG TERM GOALS: Target date: 03/14/2024  Patient will demonstrate independence in advanced HEP. Baseline:  Goal status: INITIAL  2.  Patient will report > or = to 70% improvement in Lt hip pain since starting PT. Baseline:  Goal status: INITIAL  3.  Patient will verbalize and demonstrate self-care strategies to manage pain including tissue mobility practices and change of position. Baseline:  Goal status: INITIAL   4.  Patient will be able to return to regular exercise regimen with proper form. Baseline:  Goal status: INITIAL  5.  Patient will be able to negotiate stairs  with reciprocal pattern with < or = to 2/10 hip pain.  Baseline:  Goal status: INITIAL  6.  Patient will score > or = to 68/80 on LEFS for improved ability to perform ADLs. Baseline: 59/80 Goal status: INITIAL   PLAN:  PT FREQUENCY: 2x/week  PT DURATION: 8 weeks  PLANNED INTERVENTIONS: [id number]- PT Re-evaluation, 97110-Therapeutic exercises, 97530- Therapeutic activity, [id number]- Neuromuscular re-education, 97535- Self Care, [id number]- Manual therapy, [id number]- Gait training, [phone]- Canalith repositioning, [id number]- Aquatic Therapy, [phone]- Electrical stimulation (unattended), [phone]- Electrical stimulation (manual), [id number]- Vasopneumatic device, [id number]- Ultrasound, [id number]- Traction (mechanical), [id number]- Ionotophoresis 4mg /ml Dexamethasone, Balance training, Stair training, Taping, Dry Needling, Joint mobilization, Joint manipulation, Spinal manipulation, Spinal mobilization, Vestibular training, Cryotherapy, and Moist heat  PLAN FOR NEXT SESSION: assess response to DN; continue hip strength and stability ; manual/ DN as indicated   Penelope Bowie, PT 02/22/24 11:36 AM

## 2024-02-26 ENCOUNTER — Ambulatory Visit: Admitting: Physical Therapy

## 2024-02-28 ENCOUNTER — Ambulatory Visit: Admitting: Physical Therapy

## 2024-02-28 ENCOUNTER — Encounter: Payer: Self-pay | Admitting: Physical Therapy

## 2024-02-28 DIAGNOSIS — R252 Cramp and spasm: Secondary | ICD-10-CM

## 2024-02-28 DIAGNOSIS — M25552 Pain in left hip: Secondary | ICD-10-CM | POA: Diagnosis not present

## 2024-02-28 DIAGNOSIS — M6281 Muscle weakness (generalized): Secondary | ICD-10-CM

## 2024-02-28 NOTE — Therapy (Signed)
 OUTPATIENT PHYSICAL THERAPY LOWER EXTREMITY TREATMENT   Patient Name: [name] MRN: [id number] DOB:[birth year], 38 y.o., female Today's Date: 02/28/2024  END OF SESSION:  PT End of Session - 02/28/24 1029     Visit Number 7    Date for PT Re-Evaluation 03/14/24    Authorization Type Medicare/ Medicaid    Progress Note Due on Visit 10    PT Start Time 0933    PT Stop Time 1019    PT Time Calculation (min) 46 min    Activity Tolerance Patient tolerated treatment well    Behavior During Therapy [facility] for tasks assessed/performed                   Past Medical History:  Diagnosis Date   Anemia    Anemia of chronic disease 06/07/2012   Formatting of this note might be different from the original. Hematology consultation 07-13-16 (see note)--> Anemia of chronic disease Required 3 transfusions during pregnancy  Last Assessment & Plan:  Formatting of this note might be different from the original. She has now required 3 blood transfusions this pregnancy for her anemia of chronic disease. The most recent was one week ago when she bec   Anxiety    Asthma    Asthma 11/10/2013   Back pain    Bipolar 1 disorder (HCC)    Chest pain    Chronic renal failure syndrome, stage 3 (moderate) (HCC)    Colles' fracture of left radius 10/11/2013   Complication of anesthesia    Constipation    Depression    Depression    Diabetic gastroparesis (HCC)    Diabetic neuropathy, type I diabetes mellitus (HCC)    Diabetic ulcer of right great toe (HCC) 08/27/2020   Edema, lower extremity    Gallbladder disease    Gastroparesis    GERD (gastroesophageal reflux disease)    Headache(784.0)    Hypertension    Joint pain    Kidney stones    Palpitations    Polyneuropathy in diabetes(357.2)    PONV (postoperative nausea and vomiting)    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)    S/P carpal tunnel release left 10/16/19 11/04/2019   Shortness of breath    Tachycardia    baseline  tachycardia    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)    Past Surgical History:  Procedure Laterality Date   ANAL RECTAL MANOMETRY N/A 03/25/2018   Procedure: ANO RECTAL MANOMETRY;  Surgeon: Albertina Hugger, MD;  Location: [facility];  Service: Gastroenterology;  Laterality: N/A;   CARPAL TUNNEL RELEASE Left 10/16/2019   Procedure: LEFT CARPAL TUNNEL RELEASE;  Surgeon: Darrin Emerald, MD;  Location: [facility];  Service: Orthopedics;  Laterality: Left;   CESAREAN SECTION     x 2   CHOLECYSTECTOMY     EYE SURGERY     REFRACTIVE SURGERY Bilateral    Patient Active Problem List   Diagnosis Date Noted   Bipolar disorder, current episode depressed, severe, without psychotic features (HCC) 07/10/2023   Major depressive disorder, recurrent severe without psychotic features (HCC) 07/09/2023   Chronic left hip pain 07/03/2023   Overactive bladder 02/07/2022   Recurrent cold sores 02/02/2021   Iron deficiency anemia 12/19/2020   Long term (current) use of insulin  (HCC) 12/19/2020   Migraine 11/15/2020   CKD stage 3 secondary to diabetes (HCC) 11/15/2020   Diabetic neuropathy (HCC) 11/15/2020   Type 1 diabetes mellitus with  stage 3b chronic kidney disease (HCC) 08/27/2020   Polyphagia 08/27/2020   Bipolar 1 disorder, mixed, severe (HCC) 08/27/2020   Type 1 diabetes mellitus with hyperglycemia (HCC) 08/27/2020   Financial difficulties 08/27/2020   Drug-induced weight gain 08/27/2020   Traction detachment of right retina 08/27/2020   Vitreous hemorrhage of left eye (HCC) 08/27/2020   Hypertension associated with diabetes (HCC) 08/27/2020   Type 1 diabetes mellitus with diabetic polyneuropathy 08/27/2020   Class 1 obesity with serious comorbidity and body mass index (BMI) of 33.0 to 33.9 in adult 08/27/2020   Status post eye surgery 07/01/2020   Chronic migraine without aura 01/22/2020   S/P eye surgery 08/29/2019   Diplopia 07/23/2019   Epiretinal  membrane (ERM) of left eye 07/23/2019   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/2019   Pseudophakia of left eye 11/04/2018   Chronic kidney disease, stage 3b (HCC) 01/01/2017   Diabetic nephropathy associated with type 1 diabetes mellitus (HCC) 03/01/2016   After-cataract obscuring vision, left 09/22/2014   Drug overdose, intentional (HCC) 03/27/2014   Asthma 11/10/2013   MDD (major depressive disorder), recurrent episode, severe (HCC) 09/26/2013   Generalized anxiety disorder 09/26/2013   Aphakia, right eye 09/14/2012   Retinal detachment, tractional, left eye 06/11/2012   Eczema 06/07/2012   Diabetic retinopathy associated with type 1 diabetes mellitus (HCC) 04/30/2012   H/O insertion of insulin  pump 04/30/2012   History of atrial tachycardia 08/08/2010   Gastroparesis due to DM (HCC) 08/08/2010    PCP: Austine Lefort, MD   REFERRING PROVIDER: Ulysees Gander, DO  REFERRING DIAG: [phone] (ICD-10-CM) - Left hip pain S76.012D (ICD-10-CM) - Muscle strain of gluteal region, left, subsequent encounter M70.62 (ICD-10-CM) - Greater trochanteric bursitis of left hip  THERAPY DIAG:  Pain in left hip  Cramp and spasm  Muscle weakness (generalized)  Rationale for Evaluation and Treatment: Rehabilitation  ONSET DATE: Chronic with most recent flare up 1 year ago  SUBJECTIVE:   SUBJECTIVE STATEMENT: Patient reports that left her left hip is very painful today. Pain is 7-8/10. Dry needling was helpful last session. Her knee pain is better she has been paying attention to how she has been standing.    From Eval: Patient presents with chronic left hip pain that has progressively gotten worse over the last year. She got an injection a month ago and she feels it works for a few months and wears off. She has been to PT before for her hip pain and they said she has a leg length discrepancy. She has anterior hip pain that is newer, but it was alleviate  after she got a shot.Standing tolerance in unaffected with hip pain, walking tolerance is limited to grocery shopping distance, her sitting is tolerance is limited when having to travel far. Negotiating stairs is painful. Denies numbness and tingling. Patient has been out of the gym due to the hip pain, but she tries to be consistent.   PERTINENT HISTORY: Anemia; Anxiety; Depression; type 1 diabetes; HTN;  PAIN: 02/28/24 Are you having pain? Yes: NPRS scale: 7-8/10 Pain location: lateral left hip joint Pain description: achy; sharp Aggravating factors: walking & sitting/ laying on that side; stairs Relieving factors: Nothing  PRECAUTIONS: None  RED FLAGS: None   WEIGHT BEARING RESTRICTIONS: No  FALLS:  Has patient fallen in last 6 months? No  LIVING ENVIRONMENT: Lives with: lives with their family Lives in: House/apartment Stairs: Yes: External: 3 steps; on left going up  and patient has a ramp   OCCUPATION: Disabled  PLOF: Independent, Independent with basic ADLs, Independent with gait, and Independent with transfers  PATIENT GOALS: Long lasting relief   NEXT MD VISIT: April 22  OBJECTIVE:  Note: Objective measures were completed at Evaluation unless otherwise noted.  DIAGNOSTIC FINDINGS: Lt hip MR with contrast 07/19/2023 MPRESSION: 1. Mild asymmetric gluteus minimus tendinosis on the left. No evidence of tendon tear. 2. No acute osseous findings or significant arthropathic changes. 3. No evidence of labral tear.    PATIENT SURVEYS:  LEFS 59/80 73%  COGNITION: Overall cognitive status: Within functional limits for tasks assessed     SENSATION: WFL  MUSCLE LENGTH: Hamstrings: decreased bilateral Rt > Lt   POSTURE: rounded shoulders  PALPATION: Tenderness over Lt greater trochanter Faber + on Lt  Tenderness and increased muscle spasms bilateral piriformis  Obers (+) bilateral    LOWER EXTREMITY ROM: WFL PROM ROM  besides hip IR limited bilateral ; pain  with Lt hip flexion PROM   LOWER EXTREMITY MMT:* pain  MMT Right eval Left eval  Hip flexion 5 4+*  Hip extension    Hip abduction 4 4  Hip adduction    Hip internal rotation    Hip external rotation    Knee flexion 5 5  Knee extension 5 4+*  Ankle dorsiflexion    Ankle plantarflexion    Ankle inversion    Ankle eversion     (Blank rows = not tested)   FUNCTIONAL TESTS:  5 times sit to stand: 9.24 no UE support  GAIT: Comments: Unremarkable                                                                                                                    TREATMENT DATE:  02/28/2024  Recumbent Bike Level 4 6 mins PT present to discuss progress Hamstring stretch at stair 2 x 30 bilateral  Quad stretch at stair x 30 sec bilateral  Standing hip abduction with red loop (knee bent) x 10 bilateral  Side stepping with red loop x 3 laps Supine SLR 2 x 10 bilateral  Supine Bridge 2 x 10   Trigger Point Dry Needling  Subsequent Treatment: Instructions provided previously at initial dry needling treatment.   Patient Verbal Consent Given: Yes Education Handout Provided: Previously Provided Muscles Treated: bilateral left quads & TFL Electrical Stimulation Performed: No Treatment Response/Outcome: Utilized skilled palpation to identify trigger points.  During dry needling able to palpate muscle twitch and muscle elongation  Soft tissue mobilization to muscles needled to further promote tissue elongation and decreased pain.      02/22/2024  Recumbent Bike Level 3 5 mins PT present to discuss progress Hamstring stretch at stair 2 x 30 bilateral  Quad stretch at stair x 30 sec bilateral  Seated figure 4 stretch 2x20 sec bilateral  Hip internal / external stretch 4 x 10 sec holds  Trigger Point Dry Needling  Subsequent Treatment: Instructions provided previously at initial dry needling treatment.  Patient Verbal Consent Given: Yes Education Handout Provided: Previously  Provided Muscles Treated: Lt hamstrings; adductors; and glutes + piriformis  Electrical Stimulation Performed: No Treatment Response/Outcome: Utilized skilled palpation to identify trigger points.  During dry needling able to palpate muscle twitch and muscle elongation   Seated adductor stretch on Lt x 30 sec     02/15/2024  Recumbent Bike Level 3 5 mins PT present to discuss progress Hinging at wall x 10 Hinging without wall x 10 Discussion on knee position when standing Side stepping with red loop x 3 laps Monster walks with red loop x 3 laps Standing hip abduction with red loop (knee bent) x 10 bilateral  6 inch step up + knee drive holding 81# KB x 10 bilateral  Sit to stand holding 10# KB 2 x 10 Sidelying reverse clamshells 2 x 10 Bridge + march x 10       PATIENT EDUCATION:  Education details: POC; examination findings; DN handout Person educated: Patient Education method: Programmer, multimedia, Facilities manager, and Handouts Education comprehension: verbalized understanding, returned demonstration, and needs further education  HOME EXERCISE PROGRAM: Access Code: [facility] URL: [email or web address] Date: 01/30/2024 Prepared by: Loetta Ringer  Exercises - Hip Flexion Stretch  - 1 x daily - 7 x weekly - 2 sets - 20-30 hold - Supine Hip Internal and External Rotation  - 1 x daily - 7 x weekly - 1 sets - 10 reps - Seated Hamstring Stretch  - 1 x daily - 7 x weekly - 2 sets - 20-30 hold - Seated Piriformis Stretch with Trunk Bend  - 1 x daily - 7 x weekly - 2 sets - 20-30 hold - Sit to Stand Without Arm Support  - 2 x daily - 7 x weekly - 2 sets - 10 reps - Clamshell  - 2 x daily - 7 x weekly - 2 sets - 10 reps - Sidelying Reverse Clamshell  - 2 x daily - 7 x weekly - 2 sets - 10 reps  ASSESSMENT: Patient presents with increased left hip pain today. She is in the process of repairing her roof so she has been doing a lot of manual labor. Her knee pain has not been as bad  because she has been paying attention to her knee position in standing. Her hip pain has been increased over the past few days and she normally gets a hip injection every three months and she is coming close to that time frame. Dry needling was successful at eliciting muscle twitch and elongation. Patient verbalized pain relief after treatment session. Patient will benefit from skilled PT to address the below impairments and improve overall function.     OBJECTIVE IMPAIRMENTS: decreased activity tolerance, difficulty walking, decreased ROM, decreased strength, hypomobility, increased muscle spasms, impaired flexibility, postural dysfunction, and pain.   ACTIVITY LIMITATIONS: sitting, squatting, stairs, and transfers  PARTICIPATION LIMITATIONS: cleaning, laundry, driving, shopping, and community activity  PERSONAL FACTORS: Fitness, Time since onset of injury/illness/exacerbation, and 3+ comorbidities: Anemia; Anxiety; Depression; type 1 diabetes; HTN;  are also affecting patient's functional outcome.   REHAB POTENTIAL: Good  CLINICAL DECISION MAKING: Stable/uncomplicated  EVALUATION COMPLEXITY: Low   GOALS: Goals reviewed with patient? Yes  SHORT TERM GOALS: Target date: 02/15/2024  Patient will be independent with initial HEP. Baseline:  Goal status: INITIAL  2.  Patient will report > or = to 30% improvement in left hip pain since starting PT. Baseline:  Goal status: INITIAL   3.  Patient will be able to walk  for > or = to 15 minutes before onset of Lt hip pain. Baseline: 10 mins Goal status: INITIAL    LONG TERM GOALS: Target date: 03/14/2024  Patient will demonstrate independence in advanced HEP. Baseline:  Goal status: INITIAL  2.  Patient will report > or = to 70% improvement in Lt hip pain since starting PT. Baseline:  Goal status: INITIAL  3.  Patient will verbalize and demonstrate self-care strategies to manage pain including tissue mobility practices and change  of position. Baseline:  Goal status: INITIAL   4.  Patient will be able to return to regular exercise regimen with proper form. Baseline:  Goal status: INITIAL  5.  Patient will be able to negotiate stairs with reciprocal pattern with < or = to 2/10 hip pain.  Baseline:  Goal status: INITIAL  6.  Patient will score > or = to 68/80 on LEFS for improved ability to perform ADLs. Baseline: 59/80 Goal status: INITIAL   PLAN:  PT FREQUENCY: 2x/week  PT DURATION: 8 weeks  PLANNED INTERVENTIONS: [id number]- PT Re-evaluation, 97110-Therapeutic exercises, 97530- Therapeutic activity, [id number]- Neuromuscular re-education, 97535- Self Care, 78295- Manual therapy, [id number]- Gait training, [id number]- Canalith repositioning, [id number]- Aquatic Therapy, [phone]- Electrical stimulation (unattended), [id number]- Electrical stimulation (manual), [id number]- Vasopneumatic device, [id number]- Ultrasound, [id number]- Traction (mechanical), [id number]- Ionotophoresis 4mg /ml Dexamethasone, Balance training, Stair training, Taping, Dry Needling, Joint mobilization, Joint manipulation, Spinal manipulation, Spinal mobilization, Vestibular training, Cryotherapy, and Moist heat  PLAN FOR NEXT SESSION: assess response to DN; continue hip strength and stability ; manual/ DN as indicated   Penelope Bowie, PT 02/28/24 10:32 AM

## 2024-03-04 ENCOUNTER — Ambulatory Visit: Admitting: Physical Therapy

## 2024-03-06 ENCOUNTER — Ambulatory Visit: Admitting: Physical Therapy

## 2024-03-12 ENCOUNTER — Ambulatory Visit: Payer: Self-pay

## 2024-03-12 ENCOUNTER — Ambulatory Visit: Admitting: Professional Counselor

## 2024-03-12 ENCOUNTER — Other Ambulatory Visit: Payer: Self-pay | Admitting: Family Medicine

## 2024-03-12 ENCOUNTER — Encounter: Payer: Self-pay | Admitting: Professional Counselor

## 2024-03-12 ENCOUNTER — Telehealth: Payer: Self-pay | Admitting: Psychiatry

## 2024-03-12 ENCOUNTER — Ambulatory Visit: Attending: Sports Medicine | Admitting: Physical Therapy

## 2024-03-12 ENCOUNTER — Ambulatory Visit: Admitting: Physical Therapy

## 2024-03-12 ENCOUNTER — Encounter: Payer: Self-pay | Admitting: Physical Therapy

## 2024-03-12 DIAGNOSIS — M6281 Muscle weakness (generalized): Secondary | ICD-10-CM | POA: Diagnosis present

## 2024-03-12 DIAGNOSIS — R252 Cramp and spasm: Secondary | ICD-10-CM | POA: Diagnosis present

## 2024-03-12 DIAGNOSIS — F3162 Bipolar disorder, current episode mixed, moderate: Secondary | ICD-10-CM | POA: Diagnosis not present

## 2024-03-12 DIAGNOSIS — M25552 Pain in left hip: Secondary | ICD-10-CM | POA: Diagnosis present

## 2024-03-12 NOTE — Telephone Encounter (Signed)
 FYI Only or Action Required?: Action required by provider  Patient was last seen in primary care on 12/31/2023 by Austine Lefort, MD. Called Nurse Triage reporting Mouth Lesions. Symptoms began yesterday. Interventions attempted: Nothing. Symptoms are: gradually worsening.  Triage Disposition: See PCP Within 2 Weeks  Patient/caregiver understands and will follow disposition?: Yes, is out of her medication and would like a script sent in to pharmacy as these are recurrent.            Copied from CRM [id number]. Topic: Clinical - Medication Question >> Mar 12, 2024 11:39 AM El Gravely T wrote: Reason for CRM: Patient calling requesting medication for fever blisters to be called into pharmacy.  Name of medication patient requesting: Alacyclovir (Valtrex ). Per patient, has been a while since prescribed.   Preferred Pharmacy:  [facility] [address], [place] - [address] [place] [address] Phone: [phone] Fax: [phone] Hours: Not open 24 hours  Patient can be reached at [phone] if any questions or to discuss further. 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 APPEARANCE of BLISTERS: "Describe the sores."     Cold sore 2. SIZE: "How large an area is involved with the cold sores?" (e.g., inches, cm or compare to coins)     Pencil head 3. LOCATION: "Which part of the lip is involved?"     Top lip 4. ONSET: "When did the fever blisters begin?"     yesterday 5. RECURRENT BLISTERS: "Have you had fever blisters before?" If Yes, ask: "When was the last time?" "How many times a year?"     Yes, about a month ago 6. OTHER SYMPTOMS: "Do you have any other symptoms?" (e.g., fever, sores inside mouth)     no  Protocols used: Cold Sores (Fever Blisters)-A-AH

## 2024-03-12 NOTE — Progress Notes (Deleted)
 Ben Jackson D.[name] [address] [place] [address] Phone: [phone]   Assessment and Plan:     There are no diagnoses linked to this encounter.  ***   Pertinent previous records reviewed include ***    Follow Up: ***     Subjective:   I, Satrina Magallanes, am serving as a Neurosurgeon for Doctor Ulysees Gander   Chief Complaint: leg pain    HPI:    02/28/2023 Patient is a 38 year old female complaining of leg pain. Patient states that she had left pain for years, she was dx with bursitis, she used to get CSI but the pain has changed, she had pain when sitting for long time, and then has pain when trying to walk after sitting , pain radiates to the back of the leg, sleep is hard she is a side sleeper, no numbness or tingling, tylenol  doesn't help with the pain, she states she used to hear hip pop but doesn't do that stretching anymore that would cause the popping, she is TTP in that area , she states she can feel a knot    03/26/2023 Patient states she had some pain while on vacation. Back pain has been more frequent when she is in a sitting position for a long period of time. Left hip has been killing her   04/17/2023 Patient states after CSI pain calmed down . Went swimming yesterday so she is a little flared    06/20/2023 Patient states past couple of weeks leg pain has flared. PT tomorrow    07/19/2023 Patient states she is doing okay MRI caused some pain for a couple of days     08/21/2023 Patient states she is getting better. Notices pain when she is walking or sitting for long periods of time   01/01/2024 Patient states right now it is ok. It has become really bothersome if she sits too long. Sleep is uncomfortable where she has to keep moving around to set comfortable.  Started to flare back up this past month but was doing well before then. Had some pain in the front almost on the bone on the left side.     01/29/2024 Patient states she is doing a little better. Sat and Sunday she was flared was driving back from the beach. Started PT   03/13/2024 Patient states   Relevant Historical Information: Hypertension, DM type I, CKD  Additional pertinent review of systems negative.   Current Outpatient Medications:    albuterol  (PROVENTIL ) (2.5 MG/3ML) 0.083% nebulizer solution, Take 3 mLs (2.5 mg total) by nebulization every 6 (six) hours as needed for wheezing or shortness of breath., Disp: 150 mL, Rfl: 1   albuterol  (VENTOLIN  HFA) 108 (90 Base) MCG/ACT inhaler, TAKE 2 PUFFS BY MOUTH EVERY 6 HOURS AS NEEDED FOR WHEEZE OR SHORTNESS OF BREATH, Disp: 18 each, Rfl: 2   amphetamine-dextroamphetamine (ADDERALL XR) 20 MG 24 hr capsule, Take 20 mg by mouth 2 (two) times daily., Disp: , Rfl:    amphetamine-dextroamphetamine (ADDERALL) 10 MG tablet, Take 10 mg by mouth daily in the afternoon., Disp: , Rfl:    aspirin  81 MG chewable tablet, Chew 1 tablet (81 mg total) by mouth at bedtime., Disp: , Rfl:    Budeson-Glycopyrrol-Formoterol  (BREZTRI AEROSPHERE) 160-9-4.8 MCG/ACT AERO, Inhale 2 puffs into the lungs 2 (two) times daily., Disp: , Rfl:    Cariprazine  HCl (VRAYLAR ) 4.5 MG CAPS, Take 1 capsule (4.5 mg total) by mouth daily., Disp:  90 capsule, Rfl: 0   cloNIDine  (CATAPRES ) 0.1 MG tablet, Take 1 tablet (0.1 mg total) by mouth 2 (two) times daily as needed (anxiety). (Patient taking differently: Take 0.1 mg by mouth 2 (two) times daily as needed (anxiety). prn), Disp: 60 tablet, Rfl: 0   FASENRA 30 MG/ML SOSY, Inject into the skin every 3 (three) months., Disp: , Rfl:    Galcanezumab -gnlm (EMGALITY ) 120 MG/ML SOAJ, Inject 120 mg into the skin every 30 (thirty) days., Disp: 1.12 mL, Rfl: 11   GLUCAGON EMERGENCY 1 MG injection, Inject 1 mg into the skin as needed (hypoglycemia.)., Disp: , Rfl:    glucose blood (ACCU-CHEK GUIDE) test strip, Use to check blood sugar 3 time a day, Disp: 300 strip, Rfl: 4   insulin   glargine, 1 Unit Dial, (TOUJEO  SOLOSTAR) 300 UNIT/ML Solostar Pen, Inject 15 Units into the skin daily., Disp: 4.5 mL, Rfl: 0   insulin  lispro (HUMALOG ) 100 UNIT/ML injection, Inject up to 150 units daily according to insulin  pump settings., Disp: 10 mL, Rfl: 0   levonorgestrel  (MIRENA , 52 MG,) 20 MCG/24HR IUD, 1 each by Intrauterine route once., Disp: , Rfl:    losartan  (COZAAR ) 25 MG tablet, Take 12.5 mg by mouth daily. Pt takes 12.5 mg. 12/31/23., Disp: , Rfl:    meloxicam  (MOBIC ) 15 MG tablet, Take 1 tablet (15 mg total) by mouth daily., Disp: 30 tablet, Rfl: 0   montelukast  (SINGULAIR ) 10 MG tablet, Take 1 tablet (10 mg total) by mouth at bedtime., Disp: 90 tablet, Rfl: 1   MOUNJARO  10 MG/0.5ML Pen, Inject 10 mg into the skin once a week., Disp: , Rfl:    pravastatin  (PRAVACHOL ) 20 MG tablet, Take 1 tablet (20 mg total) by mouth at bedtime., Disp: 90 tablet, Rfl: 3   sertraline  (ZOLOFT ) 50 MG tablet, Take 1 tablet (50 mg total) by mouth daily., Disp: 90 tablet, Rfl: 0   Ubrogepant  (UBRELVY ) 100 MG TABS, Take 1 tablet (100 mg total) by mouth daily as needed (migraine headaches.)., Disp: 30 tablet, Rfl: 3   Objective:     There were no vitals filed for this visit.    There is no height or weight on file to calculate BMI.    Physical Exam:    ***   Electronically signed by:  Marshall Skeeter D.[name] 7:44 AM 03/12/24

## 2024-03-12 NOTE — Therapy (Addendum)
 OUTPATIENT PHYSICAL THERAPY LOWER EXTREMITY TREATMENT/ DISCHARGE   Patient Name: [name] MRN: [id number] DOB:January 06, 1986, 38 y.o., female Today's Date: 03/12/2024  END OF SESSION:  PT End of Session - 03/12/24 1318     Visit Number 8    Date for PT Re-Evaluation 03/14/24    Authorization Type Medicare/ Medicaid    Progress Note Due on Visit 10    PT Start Time 1233    PT Stop Time 1313    PT Time Calculation (min) 40 min    Activity Tolerance Patient tolerated treatment well    Behavior During Therapy [facility] for tasks assessed/performed                    Past Medical History:  Diagnosis Date   Anemia    Anemia of chronic disease 06/07/2012   Formatting of this note might be different from the original. Hematology consultation 07-13-16 (see note)--> Anemia of chronic disease Required 3 transfusions during pregnancy  Last Assessment & Plan:  Formatting of this note might be different from the original. She has now required 3 blood transfusions this pregnancy for her anemia of chronic disease. The most recent was one week ago when she bec   Anxiety    Asthma    Asthma 11/10/2013   Back pain    Bipolar 1 disorder (HCC)    Chest pain    Chronic renal failure syndrome, stage 3 (moderate) (HCC)    Colles' fracture of left radius 10/11/2013   Complication of anesthesia    Constipation    Depression    Depression    Diabetic gastroparesis (HCC)    Diabetic neuropathy, type I diabetes mellitus (HCC)    Diabetic ulcer of right great toe (HCC) 08/27/2020   Edema, lower extremity    Gallbladder disease    Gastroparesis    GERD (gastroesophageal reflux disease)    Headache(784.0)    Hypertension    Joint pain    Kidney stones    Palpitations    Polyneuropathy in diabetes(357.2)    PONV (postoperative nausea and vomiting)    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)    S/P carpal tunnel release left 10/16/19 11/04/2019   Shortness of breath    Tachycardia     baseline tachycardia    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)    Past Surgical History:  Procedure Laterality Date   ANAL RECTAL MANOMETRY N/A 03/25/2018   Procedure: ANO RECTAL MANOMETRY;  Surgeon: Legrand Victory LITTIE DOUGLAS, MD;  Location: [facility];  Service: Gastroenterology;  Laterality: N/A;   CARPAL TUNNEL RELEASE Left 10/16/2019   Procedure: LEFT CARPAL TUNNEL RELEASE;  Surgeon: Margrette Taft BRAVO, MD;  Location: [facility];  Service: Orthopedics;  Laterality: Left;   CESAREAN SECTION     x 2   CHOLECYSTECTOMY     EYE SURGERY     REFRACTIVE SURGERY Bilateral    Patient Active Problem List   Diagnosis Date Noted   Bipolar disorder, current episode depressed, severe, without psychotic features (HCC) 07/10/2023   Major depressive disorder, recurrent severe without psychotic features (HCC) 07/09/2023   Chronic left hip pain 07/03/2023   Overactive bladder 02/07/2022   Recurrent cold sores 02/02/2021   Iron deficiency anemia 12/19/2020   Long term (current) use of insulin  (HCC) 12/19/2020   Migraine 11/15/2020   CKD stage 3 secondary to diabetes (HCC) 11/15/2020   Diabetic neuropathy (HCC) 11/15/2020   Type 1 diabetes  mellitus with stage 3b chronic kidney disease (HCC) 08/27/2020   Polyphagia 08/27/2020   Bipolar 1 disorder, mixed, severe (HCC) 08/27/2020   Type 1 diabetes mellitus with hyperglycemia (HCC) 08/27/2020   Financial difficulties 08/27/2020   Drug-induced weight gain 08/27/2020   Traction detachment of right retina 08/27/2020   Vitreous hemorrhage of left eye (HCC) 08/27/2020   Hypertension associated with diabetes (HCC) 08/27/2020   Type 1 diabetes mellitus with diabetic polyneuropathy 08/27/2020   Class 1 obesity with serious comorbidity and body mass index (BMI) of 33.0 to 33.9 in adult 08/27/2020   Status post eye surgery 07/01/2020   Chronic migraine without aura 01/22/2020   S/P eye surgery 08/29/2019   Diplopia 07/23/2019    Epiretinal membrane (ERM) of left eye 07/23/2019   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/2019   Pseudophakia of left eye 11/04/2018   Chronic kidney disease, stage 3b (HCC) 01/01/2017   Diabetic nephropathy associated with type 1 diabetes mellitus (HCC) 03/01/2016   After-cataract obscuring vision, left 09/22/2014   Drug overdose, intentional (HCC) 03/27/2014   Asthma 11/10/2013   MDD (major depressive disorder), recurrent episode, severe (HCC) 09/26/2013   Generalized anxiety disorder 09/26/2013   Aphakia, right eye 09/14/2012   Retinal detachment, tractional, left eye 06/11/2012   Eczema 06/07/2012   Diabetic retinopathy associated with type 1 diabetes mellitus (HCC) 04/30/2012   H/O insertion of insulin  pump 04/30/2012   History of atrial tachycardia 08/08/2010   Gastroparesis due to DM (HCC) 08/08/2010    PCP: Duanne Butler DASEN, MD   REFERRING PROVIDER: Leonce Katz, DO  REFERRING DIAG: [id number] (ICD-10-CM) - Left hip pain S76.012D (ICD-10-CM) - Muscle strain of gluteal region, left, subsequent encounter M70.62 (ICD-10-CM) - Greater trochanteric bursitis of left hip  THERAPY DIAG:  Pain in left hip  Cramp and spasm  Muscle weakness (generalized)  Rationale for Evaluation and Treatment: Rehabilitation  ONSET DATE: Chronic with most recent flare up 1 year ago  SUBJECTIVE:   SUBJECTIVE STATEMENT: Patient reports her hip is more tender and sore after going to the beach last weekend. She has not had any more knee pain. She felt sore after dry needling last session but she had pain relief.     From Eval: Patient presents with chronic left hip pain that has progressively gotten worse over the last year. She got an injection a month ago and she feels it works for a few months and wears off. She has been to PT before for her hip pain and they said she has a leg length discrepancy. She has anterior hip pain that is newer, but it was  alleviate after she got a shot.Standing tolerance in unaffected with hip pain, walking tolerance is limited to grocery shopping distance, her sitting is tolerance is limited when having to travel far. Negotiating stairs is painful. Denies numbness and tingling. Patient has been out of the gym due to the hip pain, but she tries to be consistent.   PERTINENT HISTORY: Anemia; Anxiety; Depression; type 1 diabetes; HTN;  PAIN: 03/12/24 Are you having pain? Yes: NPRS scale: 6/10 Pain location: lateral left hip joint Pain description: achy; sharp Aggravating factors: walking & sitting/ laying on that side; stairs Relieving factors: Nothing  PRECAUTIONS: None  RED FLAGS: None   WEIGHT BEARING RESTRICTIONS: No  FALLS:  Has patient fallen in last 6 months? No  LIVING ENVIRONMENT: Lives with: lives with their family Lives in: House/apartment Stairs: Yes: External: 3 steps;  on left going up and patient has a ramp   OCCUPATION: Disabled  PLOF: Independent, Independent with basic ADLs, Independent with gait, and Independent with transfers  PATIENT GOALS: Long lasting relief   NEXT MD VISIT: April 22  OBJECTIVE:  Note: Objective measures were completed at Evaluation unless otherwise noted.  DIAGNOSTIC FINDINGS: Lt hip MR with contrast 07/19/2023 MPRESSION: 1. Mild asymmetric gluteus minimus tendinosis on the left. No evidence of tendon tear. 2. No acute osseous findings or significant arthropathic changes. 3. No evidence of labral tear.    PATIENT SURVEYS:  LEFS 59/80 73%  COGNITION: Overall cognitive status: Within functional limits for tasks assessed     SENSATION: WFL  MUSCLE LENGTH: Hamstrings: decreased bilateral Rt > Lt   POSTURE: rounded shoulders  PALPATION: Tenderness over Lt greater trochanter Faber + on Lt  Tenderness and increased muscle spasms bilateral piriformis  Obers (+) bilateral    LOWER EXTREMITY ROM: WFL PROM ROM  besides hip IR limited bilateral  ; pain with Lt hip flexion PROM   LOWER EXTREMITY MMT:* pain  MMT Right eval Left eval  Hip flexion 5 4+*  Hip extension    Hip abduction 4 4  Hip adduction    Hip internal rotation    Hip external rotation    Knee flexion 5 5  Knee extension 5 4+*  Ankle dorsiflexion    Ankle plantarflexion    Ankle inversion    Ankle eversion     (Blank rows = not tested)   FUNCTIONAL TESTS:  5 times sit to stand: 9.24 no UE support  GAIT: Comments: Unremarkable                                                                                                                    TREATMENT DATE:  03/12/2024  Recumbent Bike Level 3 6 mins PT present to discuss progress Discussion of POC LEFS: 49/80 61.3% Hamstring stretch at stair 2 x 30 bilateral  Quad stretch at stair x 30 sec bilateral  Hooklying hip abduction with black loop 2 x 10 Hip internal / external stretch 4 x 10 sec holds Supine TFL stretch with green strap 2 x 20 sec  Manual: therapy roller to Lt quads, adductors   02/28/2024  Recumbent Bike Level 4 6 mins PT present to discuss progress Hamstring stretch at stair 2 x 30 bilateral  Quad stretch at stair x 30 sec bilateral  Standing hip abduction with red loop (knee bent) x 10 bilateral  Side stepping with red loop x 3 laps Supine SLR 2 x 10 bilateral  Supine Bridge 2 x 10   Trigger Point Dry Needling  Subsequent Treatment: Instructions provided previously at initial dry needling treatment.   Patient Verbal Consent Given: Yes Education Handout Provided: Previously Provided Muscles Treated: bilateral left quads & TFL Electrical Stimulation Performed: No Treatment Response/Outcome: Utilized skilled palpation to identify trigger points.  During dry needling able to palpate muscle twitch and muscle elongation  Soft tissue mobilization to  muscles needled to further promote tissue elongation and decreased pain.      02/22/2024  Recumbent Bike Level 3 5 mins PT present to  discuss progress Hamstring stretch at stair 2 x 30 bilateral  Quad stretch at stair x 30 sec bilateral  Seated figure 4 stretch 2x20 sec bilateral  Hip internal / external stretch 4 x 10 sec holds  Trigger Point Dry Needling  Subsequent Treatment: Instructions provided previously at initial dry needling treatment.   Patient Verbal Consent Given: Yes Education Handout Provided: Previously Provided Muscles Treated: Lt hamstrings; adductors; and glutes + piriformis  Electrical Stimulation Performed: No Treatment Response/Outcome: Utilized skilled palpation to identify trigger points.  During dry needling able to palpate muscle twitch and muscle elongation   Seated adductor stretch on Lt x 30 sec     02/15/2024  Recumbent Bike Level 3 5 mins PT present to discuss progress Hinging at wall x 10 Hinging without wall x 10 Discussion on knee position when standing Side stepping with red loop x 3 laps Monster walks with red loop x 3 laps Standing hip abduction with red loop (knee bent) x 10 bilateral  6 inch step up + knee drive holding 89# KB x 10 bilateral  Sit to stand holding 10# KB 2 x 10 Sidelying reverse clamshells 2 x 10 Bridge + march x 10       PATIENT EDUCATION:  Education details: POC; examination findings; DN handout Person educated: Patient Education method: Programmer, multimedia, Facilities manager, and Handouts Education comprehension: verbalized understanding, returned demonstration, and needs further education  HOME EXERCISE PROGRAM: Access Code: [facility] URL: [email or web address] Date: 01/30/2024 Prepared by: Burnard  Exercises - Hip Flexion Stretch  - 1 x daily - 7 x weekly - 2 sets - 20-30 hold - Supine Hip Internal and External Rotation  - 1 x daily - 7 x weekly - 1 sets - 10 reps - Seated Hamstring Stretch  - 1 x daily - 7 x weekly - 2 sets - 20-30 hold - Seated Piriformis Stretch with Trunk Bend  - 1 x daily - 7 x weekly - 2 sets - 20-30 hold - Sit to  Stand Without Arm Support  - 2 x daily - 7 x weekly - 2 sets - 10 reps - Clamshell  - 2 x daily - 7 x weekly - 2 sets - 10 reps - Sidelying Reverse Clamshell  - 2 x daily - 7 x weekly - 2 sets - 10 reps  ASSESSMENT: Patient presents with 6/10 left hip pain after traveling to the beach last weekend. She has an appointment scheduled with referring provider tomorrow. Will assess how appointment goes and decide next treatment session whether we will continue or discharge. Patient verbalized she feels the same since evaluation, and PT is in agreement with this. She has been consistently verbalizing 6-8/10 hip pain over the last few weeks and has not tolerated any exercise progressions. Now, her pain is more constant and she has been having more anterior hip pain compared to lateral. Her standing tolerance is still limited to about 15 mins and she has 4-5/10 hip pain when negotiating stairs with a reciprocal pattern. When driving she has increased hip pain and stiffness once she goes to stand up after sitting for 30 mins. Patient did verbalize some relief after manual soft tissue techniques. Patient will benefit from skilled PT to address the below impairments and improve overall function.      OBJECTIVE IMPAIRMENTS: decreased activity tolerance, difficulty walking, decreased  ROM, decreased strength, hypomobility, increased muscle spasms, impaired flexibility, postural dysfunction, and pain.   ACTIVITY LIMITATIONS: sitting, squatting, stairs, and transfers  PARTICIPATION LIMITATIONS: cleaning, laundry, driving, shopping, and community activity  PERSONAL FACTORS: Fitness, Time since onset of injury/illness/exacerbation, and 3+ comorbidities: Anemia; Anxiety; Depression; type 1 diabetes; HTN;  are also affecting patient's functional outcome.   REHAB POTENTIAL: Good  CLINICAL DECISION MAKING: Stable/uncomplicated  EVALUATION COMPLEXITY: Low   GOALS: Goals reviewed with patient? Yes  SHORT TERM  GOALS: Target date: 02/15/2024  Patient will be independent with initial HEP. Baseline:  Goal status: MET 03/12/2024  2.  Patient will report > or = to 30% improvement in left hip pain since starting PT. Baseline:  Goal status: IN PROGRESS 03/12/2024   3.  Patient will be able to walk for > or = to 15 minutes before onset of Lt hip pain. Baseline: 10 mins Goal status: MET 03/12/2024    LONG TERM GOALS: Target date: 03/14/2024  Patient will demonstrate independence in advanced HEP. Baseline:  Goal status: IN PROGRESS 03/12/2024  2.  Patient will report > or = to 70% improvement in Lt hip pain since starting PT. Baseline:  Goal status: IN PROGRESS 03/12/2024  3.  Patient will verbalize and demonstrate self-care strategies to manage pain including tissue mobility practices and change of position. Baseline:  Goal status: IN PROGRESS 03/12/2024   4.  Patient will be able to return to regular exercise regimen with proper form. Baseline:  Goal status: IN PROGRESS 03/12/2024  5.  Patient will be able to negotiate stairs with reciprocal pattern with < or = to 2/10 hip pain.  Baseline:  Goal status: Partially met (4-5/10 pain) 03/12/2024  6.  Patient will score > or = to 68/80 on LEFS for improved ability to perform ADLs. Baseline: 49/80 Goal status: IN PROGRESS 03/12/2024   PLAN:  PT FREQUENCY: 2x/week  PT DURATION: 8 weeks  PLANNED INTERVENTIONS: 97164- PT Re-evaluation, 97110-Therapeutic exercises, 97530- Therapeutic activity, 97112- Neuromuscular re-education, 97535- Self Care, [id number]- Manual therapy, [phone]- Gait training, [phone]- Canalith repositioning, [id number]- Aquatic Therapy, [phone]- Electrical stimulation (unattended), [phone]- Electrical stimulation (manual), 97016- Vasopneumatic device, [id number]- Ultrasound, [id number]- Traction (mechanical), [id number]- Ionotophoresis 4mg /ml Dexamethasone, Balance training, Stair training, Taping, Dry Needling, Joint mobilization, Joint manipulation, Spinal manipulation,  Spinal mobilization, Vestibular training, Cryotherapy, and Moist heat  PLAN FOR NEXT SESSION: see how MD appointment went; recert or d/c  Kristeen Sar, PT 03/12/24 1:19 PM   PHYSICAL THERAPY DISCHARGE SUMMARY  Visits from Start of Care: 8  Patient is being discharged due to not returning since last visit.  Patient agrees to discharge. Patient goals were not met. Patient is being discharged due to not returning since the last visit.   Kristeen Sar, PT 05/14/24 9:16 AM

## 2024-03-12 NOTE — Telephone Encounter (Signed)
 Patient requested refill for Caplyta. Ph: [phone] Pharmacy [facility] [address] [place]

## 2024-03-12 NOTE — Progress Notes (Signed)
      [facility] Counselor/Therapist Progress Note  Patient ID: [name], MRN: [id number],    Date: 03/12/2024  Time Spent: 10:15 AM - 11:12 AM  Treatment Type: Individual Therapy  Reported Symptoms: mood swings, worries, low self esteem, irritability, emotional dysregulation, low mood, interpersonal concerns, stress  Mental Status Exam:  Appearance:   Bizarre     Behavior:  Appropriate and Sharing  Motor:  Normal  Speech/Language:   Clear and Coherent and Normal Rate  Affect:  Appropriate and Congruent  Mood:  normal  Thought process:  normal  Thought content:    WNL  Sensory/Perceptual disturbances:    WNL  Orientation:  oriented to person, place, time/date, and situation  Attention:  Good  Concentration:  Good  Memory:  WNL  Fund of knowledge:   Good  Insight:    Good  Judgment:   Good  Impulse Control:  Good   Risk Assessment: Danger to Self:  No Self-injurious Behavior: No Danger to Others: No Duty to Warn:no Physical Aggression / Violence:No  Access to Firearms a concern: No  Gang Involvement:No   Subjective: Patient presented to session to address concerns of bipolar disorder.  She reported mixed progress at this time.  She reported feeling solemn and then to become angry with some snappiness and simmering explosivity.  She reported this having happened when she was off of her medications for a week and noticed her mood to be all over the place and experience of irritability for no reason.  Counselor affirmed patient self-awareness around her experience and feelings, and patient resourcing of gratitude which patient identified as helpful.  Counselor and patient continue to discuss patient treatment plan and counselor noted glitch in system where there is a lapsed plan.  Patient processed the experience of conflicts with her ex and her plans to retrieve the remainder of her belongings from their shared residence.  She reflected on the nature of their  difficulties, and the variables of relationships, recent move, and children as additional stressors in her life at this time.  Patient processed parenting concerns and tensions with boyfriend.  Counselor actively listened and helped to facilitate insight into and develop strategies around patient concerns.  Interventions: Assertiveness/Communication, Solution-Oriented/Positive Psychology, Humanistic/Existential, and Insight-Oriented  Diagnosis:   ICD-10-CM   1. Bipolar 1 disorder, mixed, moderate (HCC)  F31.62       Plan: Pt is scheduled for a follow-up. Continue process work and Conservation officer, historic buildings. Pt short term goal between sessions to be mindful of overfunctioning for others especially in primary relationship, and work to identify where she might receive support from others; pt to also be mindful of pacing herself across spheres of life during time of multiple transitions with related stress.  Almarie ONEIDA Sprang, [facility]

## 2024-03-12 NOTE — Telephone Encounter (Signed)
 Disc alternative to Caplyta bc she doesn't have much joy. Wlll DC Vraylar  and try caplyta 1 daily

## 2024-03-13 ENCOUNTER — Other Ambulatory Visit: Payer: Self-pay | Admitting: Family Medicine

## 2024-03-13 ENCOUNTER — Ambulatory Visit: Admitting: Sports Medicine

## 2024-03-13 ENCOUNTER — Other Ambulatory Visit: Payer: Self-pay

## 2024-03-13 MED ORDER — VALACYCLOVIR HCL 1 G PO TABS: 2000.0000 mg | ORAL_TABLET | Freq: Two times a day (BID) | ORAL | 3 refills | Status: AC

## 2024-03-13 MED ORDER — LUMATEPERONE TOSYLATE 42 MG PO CAPS: 42.0000 mg | ORAL_CAPSULE | Freq: Every day | ORAL | 1 refills | Status: DC

## 2024-03-13 NOTE — Telephone Encounter (Signed)
 Pended Rx to Dr. Toi Foster for 42 mg

## 2024-03-13 NOTE — Telephone Encounter (Signed)
 Pt had been given samples. She is now asking for a Rx.

## 2024-03-14 ENCOUNTER — Ambulatory Visit: Admitting: Physical Therapy

## 2024-03-14 ENCOUNTER — Ambulatory Visit (INDEPENDENT_AMBULATORY_CARE_PROVIDER_SITE_OTHER): Admitting: Sports Medicine

## 2024-03-14 ENCOUNTER — Other Ambulatory Visit: Payer: Self-pay

## 2024-03-14 VITALS — BP 142/80 | HR 99 | Ht 66.0 in | Wt 166.8 lb

## 2024-03-14 DIAGNOSIS — S76012D Strain of muscle, fascia and tendon of left hip, subsequent encounter: Secondary | ICD-10-CM

## 2024-03-14 DIAGNOSIS — M25552 Pain in left hip: Secondary | ICD-10-CM | POA: Diagnosis not present

## 2024-03-14 NOTE — Progress Notes (Signed)
 [name] D.[name] [address] [place] [address] Phone: [phone]   Assessment and Plan:     1. Left hip pain (Primary) 2. Muscle strain of gluteal region, left, subsequent encounter -Chronic with exacerbation, subsequent visit - Continued left hip pain, worsening from prior office visit with new presentation of significant anterior hip pain.  Most consistent with intra-articular pathology, however left hip MRI with intra-articular contrast from 07/03/2023 only showed mild gluteus minimus tendinosis - Based on physical exam and HPI most consistent with intra-articular pain, patient elected to proceed with intra-articular left hip CSI.  Tolerated well per note below - Continue HEP as tolerated.  Recommend pausing physical therapy at this time to allow for additional rest -Continue Tylenol  500 to 1000 mg tablets 2-3 times a day for day-to-day pain relief - Do not recommend NSAID use due to past medical history of kidney disease per patient  Procedure: Ultrasound Guided Hip Acetabulofemoral Joint Injection Side: Left Diagnosis: Left hip pain US  Indication:  - accuracy is paramount for diagnosis - to ensure therapeutic efficacy or procedural success - to reduce procedural risk  After explaining the procedure, viable alternatives, risks, and answering any questions, consent was given verbally. The site was cleaned with chlorhexidine  prep. An ultrasound transducer was placed on the anterior thigh/hip.   The acetabular joint, labrum, and femoral shaft were identified.  The neurovascular structures were identified and an approach was found specifically avoiding these structures.  A steroid injection was performed under ultrasound guidance with sterile technique using 2ml of 1% lidocaine  without epinephrine and 40 mg of triamcinolone  (KENALOG ) 40mg /ml. This was well tolerated and resulted in  relief.  Needle was removed and dressing placed and  post injection instructions were given including  a discussion of likely return of pain today after the anesthetic wears off (with the possibility of worsened pain) until the steroid starts to work in 1-3 days.   Pt was advised to call or return to clinic if these symptoms worsen or fail to improve as anticipated. Images permanently stored.     Pertinent previous records reviewed include physical therapy note 03/12/24  Follow Up: 4 weeks for reevaluation.  If no improvement or worsening of symptoms, could consider lumbar etiology and workup with lumbar x-ray   Subjective:   I, [name] am a scribe for Dr. Cleora Daft.     Chief Complaint: leg pain    HPI:    02/28/2023 Patient is a 38 year old female complaining of leg pain. Patient states that she had left pain for years, she was dx with bursitis, she used to get CSI but the pain has changed, she had pain when sitting for long time, and then has pain when trying to walk after sitting , pain radiates to the back of the leg, sleep is hard she is a side sleeper, no numbness or tingling, tylenol  doesn't help with the pain, she states she used to hear hip pop but doesn't do that stretching anymore that would cause the popping, she is TTP in that area , she states she can feel a knot    03/26/2023 Patient states she had some pain while on vacation. Back pain has been more frequent when she is in a sitting position for a long period of time. Left hip has been killing her   04/17/2023 Patient states after CSI pain calmed down . Went swimming yesterday so she is a little flared    06/20/2023  Patient states past couple of weeks leg pain has flared. PT tomorrow    07/19/2023 Patient states she is doing okay MRI caused some pain for a couple of days     08/21/2023 Patient states she is getting better. Notices pain when she is walking or sitting for long periods of time   01/01/2024 Patient states right now it is ok. It has become really bothersome  if she sits too long. Sleep is uncomfortable where she has to keep moving around to set comfortable.  Started to flare back up this past month but was doing well before then. Had some pain in the front almost on the bone on the left side.    01/29/2024 Patient states she is doing a little better. Sat and Sunday she was flared was driving back from the beach. Started PT   03/13/2024 Patient states in a little pain. Been doing physical therapy but it doesn't seem like that it is helping. The front pain in the hip area has gotten worse especially when she tries to get up and walk. Would like to know if it is time for another injection.    Relevant Historical Information: Hypertension, DM type I, CKD  Additional pertinent review of systems negative.   Current Outpatient Medications:    albuterol  (PROVENTIL ) (2.5 MG/3ML) 0.083% nebulizer solution, Take 3 mLs (2.5 mg total) by nebulization every 6 (six) hours as needed for wheezing or shortness of breath., Disp: 150 mL, Rfl: 1   albuterol  (VENTOLIN  HFA) 108 (90 Base) MCG/ACT inhaler, TAKE 2 PUFFS BY MOUTH EVERY 6 HOURS AS NEEDED FOR WHEEZE OR SHORTNESS OF BREATH, Disp: 18 each, Rfl: 2   amphetamine-dextroamphetamine (ADDERALL XR) 20 MG 24 hr capsule, Take 20 mg by mouth 2 (two) times daily., Disp: , Rfl:    amphetamine-dextroamphetamine (ADDERALL) 10 MG tablet, Take 10 mg by mouth daily in the afternoon., Disp: , Rfl:    aspirin  81 MG chewable tablet, Chew 1 tablet (81 mg total) by mouth at bedtime., Disp: , Rfl:    Budeson-Glycopyrrol-Formoterol  (BREZTRI AEROSPHERE) 160-9-4.8 MCG/ACT AERO, Inhale 2 puffs into the lungs 2 (two) times daily., Disp: , Rfl:    Cariprazine  HCl (VRAYLAR ) 4.5 MG CAPS, Take 1 capsule (4.5 mg total) by mouth daily., Disp: 90 capsule, Rfl: 0   cloNIDine  (CATAPRES ) 0.1 MG tablet, Take 1 tablet (0.1 mg total) by mouth 2 (two) times daily as needed (anxiety). (Patient taking differently: Take 0.1 mg by mouth 2 (two) times daily as  needed (anxiety). prn), Disp: 60 tablet, Rfl: 0   FASENRA 30 MG/ML SOSY, Inject into the skin every 3 (three) months., Disp: , Rfl:    Galcanezumab -gnlm (EMGALITY ) 120 MG/ML SOAJ, Inject 120 mg into the skin every 30 (thirty) days., Disp: 1.12 mL, Rfl: 11   GLUCAGON EMERGENCY 1 MG injection, Inject 1 mg into the skin as needed (hypoglycemia.)., Disp: , Rfl:    glucose blood (ACCU-CHEK GUIDE) test strip, Use to check blood sugar 3 time a day, Disp: 300 strip, Rfl: 4   insulin  glargine, 1 Unit Dial, (TOUJEO  SOLOSTAR) 300 UNIT/ML Solostar Pen, Inject 15 Units into the skin daily., Disp: 4.5 mL, Rfl: 0   insulin  lispro (HUMALOG ) 100 UNIT/ML injection, Inject up to 150 units daily according to insulin  pump settings., Disp: 10 mL, Rfl: 0   levonorgestrel  (MIRENA , 52 MG,) 20 MCG/24HR IUD, 1 each by Intrauterine route once., Disp: , Rfl:    losartan  (COZAAR ) 25 MG tablet, Take 12.5 mg by mouth daily.  Pt takes 12.5 mg. 12/31/23., Disp: , Rfl:    lumateperone tosylate (CAPLYTA) 42 MG capsule, Take 1 capsule (42 mg total) by mouth daily., Disp: 30 capsule, Rfl: 1   meloxicam  (MOBIC ) 15 MG tablet, Take 1 tablet (15 mg total) by mouth daily., Disp: 30 tablet, Rfl: 0   montelukast  (SINGULAIR ) 10 MG tablet, Take 1 tablet (10 mg total) by mouth at bedtime., Disp: 90 tablet, Rfl: 1   MOUNJARO  10 MG/0.5ML Pen, Inject 10 mg into the skin once a week., Disp: , Rfl:    Ubrogepant  (UBRELVY ) 100 MG TABS, Take 1 tablet (100 mg total) by mouth daily as needed (migraine headaches.)., Disp: 30 tablet, Rfl: 3   valACYclovir  (VALTREX ) 1000 MG tablet, Take 2 tablets (2,000 mg total) by mouth 2 (two) times daily., Disp: 4 tablet, Rfl: 3   pravastatin  (PRAVACHOL ) 20 MG tablet, Take 1 tablet (20 mg total) by mouth at bedtime., Disp: 90 tablet, Rfl: 3   sertraline  (ZOLOFT ) 50 MG tablet, Take 1 tablet (50 mg total) by mouth daily., Disp: 90 tablet, Rfl: 0   Objective:     Vitals:   03/14/24 0934  BP: (!) 142/80  Pulse: 99   SpO2: 99%  Weight: 166 lb 12.8 oz (75.7 kg)  Height: 5\' 6"  (1.676 m)      Body mass index is 26.92 kg/m.    Physical Exam:    General: awake, alert, and oriented no acute distress, nontoxic Skin: no suspicious lesions or rashes Neuro:sensation intact distally with no deficits, normal muscle tone, no atrophy, strength 5/5 in all tested lower ext groups Psych: normal mood and affect, speech clear   Left hip: No deformity, swelling or wasting ROM Flexion 65, ext 20, IR 25, ER 30 TTP hip flexors, greater trochanter, musculature NTTP over the   si joint, lumbar spine Positive log roll with FROM for groin pain Positive FABER for groin pain Positive FADIR for groin pain Negative Piriformis test Positive trendelenberg Gait antalgic, favoring right leg   Electronically signed by:  Marshall Skeeter D.[name] 9:51 AM 03/14/24

## 2024-03-14 NOTE — Patient Instructions (Addendum)
-   Start Tylenol  500 to 1000 mg tablets 2-3 times a day for day-to-day pain relief Pause Physical therapy for now. Injection in left hip today.  Follow up in 4 weeks.

## 2024-03-18 ENCOUNTER — Ambulatory Visit: Admitting: Physical Therapy

## 2024-03-18 MED ORDER — VALACYCLOVIR HCL 1 G PO TABS: ORAL_TABLET | ORAL | 3 refills | Status: AC

## 2024-03-26 ENCOUNTER — Ambulatory Visit (INDEPENDENT_AMBULATORY_CARE_PROVIDER_SITE_OTHER): Admitting: Professional Counselor

## 2024-04-03 ENCOUNTER — Encounter

## 2024-04-09 ENCOUNTER — Ambulatory Visit: Admitting: Professional Counselor

## 2024-04-09 DIAGNOSIS — F3162 Bipolar disorder, current episode mixed, moderate: Secondary | ICD-10-CM

## 2024-04-09 DIAGNOSIS — F411 Generalized anxiety disorder: Secondary | ICD-10-CM

## 2024-04-09 NOTE — Progress Notes (Signed)
      [facility] Counselor/Therapist Progress Note  Patient ID: [name], MRN: [id number],    Date: 04/09/2024  Time Spent: 10:16 AM - 11:05 AM   Treatment Type: Individual Therapy  Reported Symptoms: trouble relaxing, stress, interpersonal concerns, anxiousness, worries, irritability  Mental Status Exam:  Appearance:   Neat     Behavior:  Appropriate, Sharing, and Motivated  Motor:  Normal  Speech/Language:   Clear and Coherent and Normal Rate  Affect:  Appropriate and Congruent  Mood:  normal  Thought process:  normal  Thought content:    WNL  Sensory/Perceptual disturbances:    WNL  Orientation:  oriented to person, place, time/date, and situation  Attention:  Good  Concentration:  Good  Memory:  WNL  Fund of knowledge:   Good  Insight:    Good  Judgment:   Good  Impulse Control:  Good   Risk Assessment: Danger to Self:  No Self-injurious Behavior: No Danger to Others: No Duty to Warn:no Physical Aggression / Violence:No  Access to Firearms a concern: No  Gang Involvement:No   Subjective: Patient presented to session to address concerns of bipolar and anxiety.  She reported mixed progress at this time.  She voiced working to take her medications consistently.  Counselor reinforced medication compliance.  She reported working to get her personal belongings back from her house, and she is struggling with interpersonal concerns.  She processed experience of concerns in her current relationship, and stressors related to former relationship.  She reflected on her sense of patterns she may bring into relationships, and types of partners she feels she may attract, including a tendency to be a fixer.  She voiced helpfulness of book she is reading called [employer] pertaining to her Christian values.  Counselor helped facilitate insight into relational dynamics and patient behavior patterns.  Counselor assisted patient in her discernment process regarding current  relationship, and help patient identify ways she could balance letting go of some expectations while maintaining her values around ways she wants her partner to show up.  Interventions: Solution-Oriented/Positive Psychology, Humanistic/Existential, and Insight-Oriented  Diagnosis:   ICD-10-CM   1. Bipolar 1 disorder, mixed, moderate (HCC)  F31.62     2. Generalized anxiety disorder  F41.1       Plan: Pt is scheduled for a follow-up; continue process work and developing coping skills. STG between sessions to continue to reflect on values and expectations in primary relationship and continue discernment process regarding interpersonal concerns.  Progress note was dictated with Dragon.  [name], [facility]

## 2024-04-10 ENCOUNTER — Ambulatory Visit: Admitting: *Deleted

## 2024-04-10 VITALS — Ht 66.0 in | Wt 166.0 lb

## 2024-04-10 DIAGNOSIS — Z0000 Encounter for general adult medical examination without abnormal findings: Secondary | ICD-10-CM | POA: Diagnosis not present

## 2024-04-10 NOTE — Progress Notes (Signed)
 Subjective:   [name] is a 38 y.o. female who presents for Medicare Annual (Subsequent) preventive examination.  Visit Complete: Virtual I connected with  Roshunda Keir on 04/10/24 by a audio enabled telemedicine application and verified that I am speaking with the correct person using two identifiers.  Patient Location: Home  Provider Location: Home Office  I discussed the limitations of evaluation and management by telemedicine. The patient expressed understanding and agreed to proceed.  Vital Signs: Because this visit was a virtual/telehealth visit, some criteria may be missing or patient reported. Any vitals not documented were not able to be obtained and vitals that have been documented are patient reported.   Cardiac Risk Factors include: advanced age (>67men, >61 women);diabetes mellitus;obesity (BMI >30kg/m2)     Objective:    Today's Vitals   04/10/24 1134  Weight: 166 lb (75.3 kg)  Height: 5' 6 (1.676 m)   Body mass index is 26.79 kg/m.     04/10/2024   11:40 AM 01/18/2024   11:11 AM 07/09/2023   11:50 AM 07/08/2023    3:07 AM 12/07/2022   10:55 AM 08/24/2020    2:58 PM 10/16/2019    7:13 AM  Advanced Directives  Does Patient Have a Medical Advance Directive? No No  No No No No  Would patient like information on creating a medical advance directive? No - Patient declined No - Patient declined  No - Patient declined Yes (MAU/Ambulatory/Procedural Areas - Information given) No - Patient declined No - Patient declined     Information is confidential and restricted. Go to Review Flowsheets to unlock data.    Current Medications (verified) Outpatient Encounter Medications as of 04/10/2024  Medication Sig   albuterol  (PROVENTIL ) (2.5 MG/3ML) 0.083% nebulizer solution Take 3 mLs (2.5 mg total) by nebulization every 6 (six) hours as needed for wheezing or shortness of breath.   albuterol  (VENTOLIN  HFA) 108 (90 Base) MCG/ACT inhaler TAKE 2 PUFFS BY  MOUTH EVERY 6 HOURS AS NEEDED FOR WHEEZE OR SHORTNESS OF BREATH   amphetamine-dextroamphetamine (ADDERALL XR) 20 MG 24 hr capsule Take 20 mg by mouth 2 (two) times daily.   amphetamine-dextroamphetamine (ADDERALL) 10 MG tablet Take 10 mg by mouth daily in the afternoon.   aspirin  81 MG chewable tablet Chew 1 tablet (81 mg total) by mouth at bedtime.   Budeson-Glycopyrrol-Formoterol  (BREZTRI AEROSPHERE) 160-9-4.8 MCG/ACT AERO Inhale 2 puffs into the lungs 2 (two) times daily.   Cariprazine  HCl (VRAYLAR ) 4.5 MG CAPS Take 1 capsule (4.5 mg total) by mouth daily.   cloNIDine  (CATAPRES ) 0.1 MG tablet Take 1 tablet (0.1 mg total) by mouth 2 (two) times daily as needed (anxiety).   FASENRA 30 MG/ML SOSY Inject into the skin every 3 (three) months.   Galcanezumab -gnlm (EMGALITY ) 120 MG/ML SOAJ Inject 120 mg into the skin every 30 (thirty) days.   GLUCAGON EMERGENCY 1 MG injection Inject 1 mg into the skin as needed (hypoglycemia.).   glucose blood (ACCU-CHEK GUIDE) test strip Use to check blood sugar 3 time a day   insulin  glargine, 1 Unit Dial, (TOUJEO  SOLOSTAR) 300 UNIT/ML Solostar Pen Inject 15 Units into the skin daily.   insulin  lispro (HUMALOG ) 100 UNIT/ML injection Inject up to 150 units daily according to insulin  pump settings.   levonorgestrel  (MIRENA , 52 MG,) 20 MCG/24HR IUD 1 each by Intrauterine route once.   losartan  (COZAAR ) 25 MG tablet Take 12.5 mg by mouth daily. Pt takes 12.5 mg. 12/31/23.   lumateperone  tosylate (CAPLYTA )  42 MG capsule Take 1 capsule (42 mg total) by mouth daily.   meloxicam  (MOBIC ) 15 MG tablet Take 1 tablet (15 mg total) by mouth daily.   montelukast  (SINGULAIR ) 10 MG tablet Take 1 tablet (10 mg total) by mouth at bedtime.   MOUNJARO  10 MG/0.5ML Pen Inject 10 mg into the skin once a week.   pravastatin  (PRAVACHOL ) 20 MG tablet Take 1 tablet (20 mg total) by mouth at bedtime.   sertraline  (ZOLOFT ) 50 MG tablet Take 1 tablet (50 mg total) by mouth daily.   Ubrogepant   (UBRELVY ) 100 MG TABS Take 1 tablet (100 mg total) by mouth daily as needed (migraine headaches.).   valACYclovir  (VALTREX ) 1000 MG tablet 2 tabs po bid x 1 prn breakout   valACYclovir  (VALTREX ) 1000 MG tablet Take 2 tablets (2,000 mg total) by mouth 2 (two) times daily.   No facility-administered encounter medications on file as of 04/10/2024.    Allergies (verified) Nsaids, Other, Toradol  [ketorolac  tromethamine ], and Ceftin   History: Past Medical History:  Diagnosis Date   Anemia    Anemia of chronic disease 06/07/2012   Formatting of this note might be different from the original. Hematology consultation 07-13-16 (see note)--> Anemia of chronic disease Required 3 transfusions during pregnancy  Last Assessment & Plan:  Formatting of this note might be different from the original. She has now required 3 blood transfusions this pregnancy for her anemia of chronic disease. The most recent was one week ago when she bec   Anxiety    Asthma    Asthma 11/10/2013   Back pain    Bipolar 1 disorder (HCC)    Chest pain    Chronic renal failure syndrome, stage 3 (moderate) (HCC)    Colles' fracture of left radius 10/11/2013   Complication of anesthesia    Constipation    Depression    Depression    Diabetic gastroparesis (HCC)    Diabetic neuropathy, type I diabetes mellitus (HCC)    Diabetic ulcer of right great toe (HCC) 08/27/2020   Edema, lower extremity    Gallbladder disease    Gastroparesis    GERD (gastroesophageal reflux disease)    Headache(784.0)    Hypertension    Joint pain    Kidney stones    Palpitations    Polyneuropathy in diabetes(357.2)    PONV (postoperative nausea and vomiting)    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)    S/P carpal tunnel release left 10/16/19 11/04/2019   Shortness of breath    Tachycardia    baseline tachycardia    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)    Past Surgical History:  Procedure Laterality  Date   ANAL RECTAL MANOMETRY N/A 03/25/2018   Procedure: ANO RECTAL MANOMETRY;  Surgeon: Legrand Victory LITTIE DOUGLAS, MD;  Location: [facility];  Service: Gastroenterology;  Laterality: N/A;   CARPAL TUNNEL RELEASE Left 10/16/2019   Procedure: LEFT CARPAL TUNNEL RELEASE;  Surgeon: Margrette Taft BRAVO, MD;  Location: [facility];  Service: Orthopedics;  Laterality: Left;   CESAREAN SECTION     x 2   CHOLECYSTECTOMY     EYE SURGERY     REFRACTIVE SURGERY Bilateral    Family History  Problem Relation Age of Onset   Diabetes Mother        type 2   Hypertension Mother    Obesity Mother    Diabetes Father    Hypertension Father    High Cholesterol Father  Sleep apnea Father    Obesity Father    Heart disease Father    Heart attack Father    Diabetes Brother        type 1   Renal Disease Brother        renal failure   Hypertension Brother    Hypertension Maternal Grandmother    Breast cancer Maternal Aunt 64   Colon cancer Maternal Aunt    Uterine cancer Maternal Aunt 93   Breast cancer Other    Rectal cancer Neg Hx    Esophageal cancer Neg Hx    Social History   Socioeconomic History   Marital status: Married    Spouse name: Not on file   Number of children: 2   Years of education: 12   Highest education level: 12th grade  Occupational History   Occupation: Arts development officer  Tobacco Use   Smoking status: Never   Smokeless tobacco: Never  Vaping Use   Vaping status: Never Used  Substance and Sexual Activity   Alcohol use: No   Drug use: No   Sexual activity: Yes    Partners: Male    Birth control/protection: I.U.D.    Comment: Mirena   Other Topics Concern   Not on file  Social History Narrative   Pt has a daughter and boyfriend.    Dad passed away suddenly heart attack 12/2019   Social Drivers of Health   Financial Resource Strain: Low Risk  (04/10/2024)   Overall Financial Resource Strain (CARDIA)    Difficulty of Paying Living Expenses: Not hard at all  Food Insecurity: No  Food Insecurity (04/10/2024)   Hunger Vital Sign    Worried About Running Out of Food in the Last Year: Never true    Ran Out of Food in the Last Year: Never true  Transportation Needs: No Transportation Needs (04/10/2024)   PRAPARE - Administrator, Civil Service (Medical): No    Lack of Transportation (Non-Medical): No  Physical Activity: Insufficiently Active (04/10/2024)   Exercise Vital Sign    Days of Exercise per Week: 3 days    Minutes of Exercise per Session: 30 min  Stress: No Stress Concern Present (04/10/2024)   [employer] of Occupational Health - Occupational Stress Questionnaire    Feeling of Stress: Not at all  Social Connections: Unknown (04/10/2024)   Social Connection and Isolation Panel    Frequency of Communication with Friends and Family: More than three times a week    Frequency of Social Gatherings with Friends and Family: Three times a week    Attends Religious Services: More than 4 times per year    Active Member of Clubs or Organizations: No    Attends Banker Meetings: Never    Marital Status: Not on file    Tobacco Counseling Counseling given: Not Answered   Clinical Intake:  Pre-visit preparation completed: Yes  Pain : No/denies pain     Diabetes: Yes CBG done?: No Did pt. bring in CBG monitor from home?: No  How often do you need to have someone help you when you read instructions, pamphlets, or other written materials from your doctor or pharmacy?: 1 - Never  Interpreter Needed?: No  Information entered by :: Mliss Graff LPN   Activities of Daily Living    04/10/2024   11:32 AM 07/09/2023   11:50 AM  In your present state of health, do you have any difficulty performing the following activities:  Hearing? 0  Vision? 0   Difficulty concentrating or making decisions? 0   Walking or climbing stairs? 0   Dressing or bathing? 0   Doing errands, shopping? 0   Preparing Food and eating ? N   Using the Toilet? N    In the past six months, have you accidently leaked urine? N   Do you have problems with loss of bowel control? N   Managing your Medications? N   Managing your Finances? N   Housekeeping or managing your Housekeeping? N      Information is confidential and restricted. Go to Review Flowsheets to unlock data.    Patient Care Team: Duanne Butler DASEN, MD as PCP - General (Family Medicine) Mat Josem Stank, MD (Ophthalmology) Jules Domino, PA Copland, Bernarda NOVAK, PA-C as Referring Physician (Obstetrics and Gynecology) Gershon Donnice SAUNDERS, DPM as Consulting Physician (Podiatry) Cottle, Lorene KANDICE Raddle., MD as Attending Physician (Psychiatry) Hussami, Tawni SAUNDERS, LCSW as Social Worker (Licensed Clinical Social Worker) Siler, Hoy ORN, AGNP-C as Nurse Practitioner (Adult Health Nurse Practitioner) Faythe Purchase, MD as Consulting Physician (Endocrinology)  Indicate any recent Medical Services you may have received from other than [facility] providers in the past year (date may be approximate).     Assessment:   This is a routine wellness examination for Darlyn.  Hearing/Vision screen Hearing Screening - Comments:: No trouble hearing Vision Screening - Comments:: [facility]  Up to date   Goals Addressed             This Visit's Progress    Patient Stated       Be more active     Remain active and continue to work on improving health   On track      Depression Screen    04/10/2024   11:33 AM 12/24/2023    2:29 PM 12/07/2022   12:38 PM 11/02/2022    9:23 AM 09/20/2022    1:16 PM 05/19/2022    7:54 AM 06/08/2021    2:53 PM  PHQ 2/9 Scores  PHQ - 2 Score 0  0 0     PHQ- 9 Score 0  5         Information is confidential and restricted. Go to Review Flowsheets to unlock data.    Fall Risk    04/10/2024   11:29 AM 12/07/2022   10:54 AM 12/06/2022    4:30 PM 11/02/2022    8:11 AM 08/24/2020    3:00 PM  Fall Risk   Falls in the past year? 0 0 0 0 0  Number falls in past yr: 0 0 0 0 0   Injury with Fall? 0 0 0 0 0  Risk for fall due to :  No Fall Risks  No Fall Risks No Fall Risks  Follow up Falls evaluation completed;Education provided;Falls prevention discussed Falls prevention discussed  Falls prevention discussed  Education provided;Falls prevention discussed      Data saved with a previous flowsheet row definition    MEDICARE RISK AT HOME: Medicare Risk at Home Any stairs in or around the home?: No If so, are there any without handrails?: No Home free of loose throw rugs in walkways, pet beds, electrical cords, etc?: Yes Adequate lighting in your home to reduce risk of falls?: Yes Life alert?: No Use of a cane, walker or w/c?: No Grab bars in the bathroom?: No Shower chair or bench in shower?: No Elevated toilet seat or a handicapped toilet?: No  TIMED UP AND GO:  Was the test performed?  No    Cognitive Function:        04/10/2024   11:30 AM 12/07/2022   12:44 PM  6CIT Screen  What Year? 0 points 0 points  What month? 0 points 0 points  What time? 0 points 0 points  Count back from 20 0 points 0 points  Months in reverse 0 points 0 points  Repeat phrase 0 points 0 points  Total Score 0 points 0 points    Immunizations Immunization History  Administered Date(s) Administered   DTaP 03/05/1985, 05/18/1985, 07/14/1985, 02/15/1987, 03/07/1990   Fluzone Influenza virus vaccine,trivalent (IIV3), split virus 07/02/2013, 07/13/2014, 06/09/2016   Hepatitis B 07/30/1996, 08/27/1996, 01/28/1997   Hepatitis B, ADULT 07/30/1996, 08/27/1996, 01/28/1997   IPV 03/05/1985, 05/14/1985, 02/15/1987, 03/07/1990   Influenza Split 06/09/2010   Influenza, High Dose Seasonal PF 06/30/2011, 07/16/2012, 08/25/2015, 06/25/2016, 07/31/2017, 09/22/2019   Influenza,inj,Quad PF,6+ Mos 06/30/2011, 07/16/2012, 08/25/2015, 06/25/2016, 07/31/2017, 09/22/2019   Influenza-Unspecified 07/05/2016, 07/28/2017, 05/23/2018   MMR 02/23/1986, 06/03/2011, 08/10/2016   Pneumococcal  Conjugate-13 09/13/2017   Pneumococcal Polysaccharide-23 10/09/2005, 06/03/2011, 08/10/2016   Td 06/16/1997   Tdap 02/18/2009, 07/07/2016    TDAP status: Up to date  Flu Vaccine status: Up to date  Pneumococcal vaccine status: Up to date  Covid-19 vaccine status: Information provided on how to obtain vaccines.     Screening Tests Health Maintenance  Topic Date Due   COVID-19 Vaccine (1) Never done   Diabetic kidney evaluation - Urine ACR  Never done   HPV VACCINES (1 - 3-dose SCDM series) Never done   OPHTHALMOLOGY EXAM  07/22/2020   FOOT EXAM  09/26/2023   HEMOGLOBIN A1C  01/05/2024   INFLUENZA VACCINE  05/09/2024   Diabetic kidney evaluation - eGFR measurement  07/07/2024   Medicare Annual Wellness (AWV)  04/10/2025   DTaP/Tdap/Td (9 - Td or Tdap) 07/07/2026   Cervical Cancer Screening (HPV/Pap Cotest)  11/22/2027   Pneumococcal Vaccine 78-37 Years old (4 of 4 - PCV20 or PCV21) 12/13/2034   Hepatitis B Vaccines  Completed   Hepatitis C Screening  Completed   HIV Screening  Completed   Meningococcal B Vaccine  Aged Out    Health Maintenance  Health Maintenance Due  Topic Date Due   COVID-19 Vaccine (1) Never done   Diabetic kidney evaluation - Urine ACR  Never done   HPV VACCINES (1 - 3-dose SCDM series) Never done   OPHTHALMOLOGY EXAM  07/22/2020   FOOT EXAM  09/26/2023   HEMOGLOBIN A1C  01/05/2024    Lung Cancer Screening: (Low Dose CT Chest recommended if Age 42-80 years, 20 pack-year currently smoking OR have quit w/in 15years.) does not qualify.   Lung Cancer Screening Referral:   Additional Screening:  Hepatitis C Screening: does not qualify; Completed 2013  Vision Screening: Recommended annual ophthalmology exams for early detection of glaucoma and other disorders of the eye. Is the patient up to date with their annual eye exam?  Yes  Who is the provider or what is the name of the office in which the patient attends annual eye exams? [facility] If pt  is not established with a provider, would they like to be referred to a provider to establish care? No .   Dental Screening: Recommended annual dental exams for proper oral hygiene  Nutrition Risk Assessment:  Has the patient had any N/V/D within the last 2 months?  No  Does the patient have any non-healing wounds?  No  Has  the patient had any unintentional weight loss or weight gain?  No   Diabetes:  Is the patient diabetic?  Yes  If diabetic, was a CBG obtained today?  No  Did the patient bring in their glucometer from home?  No  How often do you monitor your CBG's? dexacom.   Financial Strains and Diabetes Management:  Are you having any financial strains with the device, your supplies or your medication? No .  Does the patient want to be seen by Chronic Care Management for management of their diabetes?  No  Would the patient like to be referred to a Nutritionist or for Diabetic Management?  No   Diabetic Exams:  Diabetic Eye Exam: Completed  Pt has been advised about the importance in completing this exam.  Diabetic Foot Exam: . Pt has been advised about the importance in completing this exam..    Community Resource Referral / Chronic Care Management: CRR required this visit?  No   CCM required this visit?  No     Plan:     I have personally reviewed and noted the following in the patient's chart:   Medical and social history Use of alcohol, tobacco or illicit drugs  Current medications and supplements including opioid prescriptions. Patient is not currently taking opioid prescriptions. Functional ability and status Nutritional status Physical activity Advanced directives List of other physicians Hospitalizations, surgeries, and ER visits in previous 12 months Vitals Screenings to include cognitive, depression, and falls Referrals and appointments  In addition, I have reviewed and discussed with patient certain preventive protocols, quality metrics, and best  practice recommendations. A written personalized care plan for preventive services as well as general preventive health recommendations were provided to patient.     Mliss Graff, LPN   11/14/7972   After Visit Summary: (MyChart) Due to this being a telephonic visit, the after visit summary with patients personalized plan was offered to patient via MyChart   Nurse Notes:

## 2024-04-10 NOTE — Progress Notes (Deleted)
 Ben Jackson D[facility] [address] [place] [address] Phone: [phone]   Assessment and Plan:     There are no diagnoses linked to this encounter.  ***   Pertinent previous records reviewed include ***    Follow Up: ***     Subjective:   I, [name], am serving as a Neurosurgeon for Doctor Morene Mace  Chief Complaint: leg pain    HPI:    02/28/2023 Patient is a 38 year old female complaining of leg pain. Patient states that she had left pain for years, she was dx with bursitis, she used to get CSI but the pain has changed, she had pain when sitting for long time, and then has pain when trying to walk after sitting , pain radiates to the back of the leg, sleep is hard she is a side sleeper, no numbness or tingling, tylenol  doesn't help with the pain, she states she used to hear hip pop but doesn't do that stretching anymore that would cause the popping, she is TTP in that area , she states she can feel a knot    03/26/2023 Patient states she had some pain while on vacation. Back pain has been more frequent when she is in a sitting position for a long period of time. Left hip has been killing her   04/17/2023 Patient states after CSI pain calmed down . Went swimming yesterday so she is a little flared    06/20/2023 Patient states past couple of weeks leg pain has flared. PT tomorrow    07/19/2023 Patient states she is doing okay MRI caused some pain for a couple of days     08/21/2023 Patient states she is getting better. Notices pain when she is walking or sitting for long periods of time   01/01/2024 Patient states right now it is ok. It has become really bothersome if she sits too long. Sleep is uncomfortable where she has to keep moving around to set comfortable.  Started to flare back up this past month but was doing well before then. Had some pain in the front almost on the bone on the left side.    01/29/2024 Patient  states she is doing a little better. Sat and Sunday she was flared was driving back from the beach. Started PT    03/13/2024 Patient states in a little pain. Been doing physical therapy but it doesn't seem like that it is helping. The front pain in the hip area has gotten worse especially when she tries to get up and walk. Would like to know if it is time for another injection.   04/14/2024 Patient states   Relevant Historical Information: Hypertension, DM type I, CKD  Additional pertinent review of systems negative.   Current Outpatient Medications:    albuterol  (PROVENTIL ) (2.5 MG/3ML) 0.083% nebulizer solution, Take 3 mLs (2.5 mg total) by nebulization every 6 (six) hours as needed for wheezing or shortness of breath., Disp: 150 mL, Rfl: 1   albuterol  (VENTOLIN  HFA) 108 (90 Base) MCG/ACT inhaler, TAKE 2 PUFFS BY MOUTH EVERY 6 HOURS AS NEEDED FOR WHEEZE OR SHORTNESS OF BREATH, Disp: 18 each, Rfl: 2   amphetamine-dextroamphetamine (ADDERALL XR) 20 MG 24 hr capsule, Take 20 mg by mouth 2 (two) times daily., Disp: , Rfl:    amphetamine-dextroamphetamine (ADDERALL) 10 MG tablet, Take 10 mg by mouth daily in the afternoon., Disp: , Rfl:    aspirin  81 MG chewable tablet, Chew 1  tablet (81 mg total) by mouth at bedtime., Disp: , Rfl:    Budeson-Glycopyrrol-Formoterol  (BREZTRI AEROSPHERE) 160-9-4.8 MCG/ACT AERO, Inhale 2 puffs into the lungs 2 (two) times daily., Disp: , Rfl:    Cariprazine  HCl (VRAYLAR ) 4.5 MG CAPS, Take 1 capsule (4.5 mg total) by mouth daily., Disp: 90 capsule, Rfl: 0   cloNIDine  (CATAPRES ) 0.1 MG tablet, Take 1 tablet (0.1 mg total) by mouth 2 (two) times daily as needed (anxiety). (Patient taking differently: Take 0.1 mg by mouth 2 (two) times daily as needed (anxiety). prn), Disp: 60 tablet, Rfl: 0   FASENRA 30 MG/ML SOSY, Inject into the skin every 3 (three) months., Disp: , Rfl:    Galcanezumab -gnlm (EMGALITY ) 120 MG/ML SOAJ, Inject 120 mg into the skin every 30 (thirty) days.,  Disp: 1.12 mL, Rfl: 11   GLUCAGON EMERGENCY 1 MG injection, Inject 1 mg into the skin as needed (hypoglycemia.)., Disp: , Rfl:    glucose blood (ACCU-CHEK GUIDE) test strip, Use to check blood sugar 3 time a day, Disp: 300 strip, Rfl: 4   insulin  glargine, 1 Unit Dial, (TOUJEO  SOLOSTAR) 300 UNIT/ML Solostar Pen, Inject 15 Units into the skin daily., Disp: 4.5 mL, Rfl: 0   insulin  lispro (HUMALOG ) 100 UNIT/ML injection, Inject up to 150 units daily according to insulin  pump settings., Disp: 10 mL, Rfl: 0   levonorgestrel  (MIRENA , 52 MG,) 20 MCG/24HR IUD, 1 each by Intrauterine route once., Disp: , Rfl:    losartan  (COZAAR ) 25 MG tablet, Take 12.5 mg by mouth daily. Pt takes 12.5 mg. 12/31/23., Disp: , Rfl:    lumateperone  tosylate (CAPLYTA ) 42 MG capsule, Take 1 capsule (42 mg total) by mouth daily., Disp: 30 capsule, Rfl: 1   meloxicam  (MOBIC ) 15 MG tablet, Take 1 tablet (15 mg total) by mouth daily., Disp: 30 tablet, Rfl: 0   montelukast  (SINGULAIR ) 10 MG tablet, Take 1 tablet (10 mg total) by mouth at bedtime., Disp: 90 tablet, Rfl: 1   MOUNJARO  10 MG/0.5ML Pen, Inject 10 mg into the skin once a week., Disp: , Rfl:    pravastatin  (PRAVACHOL ) 20 MG tablet, Take 1 tablet (20 mg total) by mouth at bedtime., Disp: 90 tablet, Rfl: 3   sertraline  (ZOLOFT ) 50 MG tablet, Take 1 tablet (50 mg total) by mouth daily., Disp: 90 tablet, Rfl: 0   Ubrogepant  (UBRELVY ) 100 MG TABS, Take 1 tablet (100 mg total) by mouth daily as needed (migraine headaches.)., Disp: 30 tablet, Rfl: 3   valACYclovir  (VALTREX ) 1000 MG tablet, 2 tabs po bid x 1 prn breakout, Disp: 30 tablet, Rfl: 3   valACYclovir  (VALTREX ) 1000 MG tablet, Take 2 tablets (2,000 mg total) by mouth 2 (two) times daily., Disp: 4 tablet, Rfl: 3   Objective:     There were no vitals filed for this visit.    There is no height or weight on file to calculate BMI.    Physical Exam:    ***   Electronically signed by:  [name] 7:39 AM 04/10/24

## 2024-04-10 NOTE — Patient Instructions (Signed)
 [name] , Thank you for taking time to come for your Medicare Wellness Visit. I appreciate your ongoing commitment to your health goals. Please review the following plan we discussed and let me know if I can assist you in the future.   Screening recommendations/referrals  Recommended yearly ophthalmology/optometry visit for glaucoma screening and checkup Recommended yearly dental visit for hygiene and checkup  Vaccinations: Influenza vaccine: up to date Pneumococcal vaccine: up to date Tdap vaccine: up to date     Preventive Care  Female Preventive care refers to lifestyle choices and visits with your health care provider that can promote health and wellness. What does preventive care include? A yearly physical exam. This is also called an annual well check. Dental exams once or twice a year. Routine eye exams. Ask your health care provider how often you should have your eyes checked. Personal lifestyle choices, including: Daily care of your teeth and gums. Regular physical activity. Eating a healthy diet. Avoiding tobacco and drug use. Limiting alcohol use. Practicing safe sex. Taking low-dose aspirin  every day. Taking vitamin and mineral supplements as recommended by your health care provider. What happens during an annual well check? The services and screenings done by your health care provider during your annual well check will depend on your age, overall health, lifestyle risk factors, and family history of disease. Counseling  Your health care provider may ask you questions about your: Alcohol use. Tobacco use. Drug use. Emotional well-being. Home and relationship well-being. Sexual activity. Eating habits. History of falls. Memory and ability to understand (cognition). Work and work Astronomer. Reproductive health. Screening  You may have the following tests or measurements: Height, weight, and BMI. Blood pressure. Lipid and cholesterol levels. These may  be checked every 5 years, or more frequently if you are over 94 years old. Skin check. Lung cancer screening. You may have this screening every year starting at age 16 if you have a 30-pack-year history of smoking and currently smoke or have quit within the past 15 years. Fecal occult blood test (FOBT) of the stool. You may have this test every year starting at age 30. Flexible sigmoidoscopy or colonoscopy. You may have a sigmoidoscopy every 5 years or a colonoscopy every 10 years starting at age 28. Hepatitis C blood test. Hepatitis B blood test. Sexually transmitted disease (STD) testing. Diabetes screening. This is done by checking your blood sugar (glucose) after you have not eaten for a while (fasting). You may have this done every 1-3 years. Bone density scan. This is done to screen for osteoporosis. You may have this done starting at age 82. Mammogram. This may be done every 1-2 years. Talk to your health care provider about how often you should have regular mammograms. Talk with your health care provider about your test results, treatment options, and if necessary, the need for more tests. Vaccines  Your health care provider may recommend certain vaccines, such as: Influenza vaccine. This is recommended every year. Tetanus, diphtheria, and acellular pertussis (Tdap, Td) vaccine. You may need a Td booster every 10 years. Zoster vaccine. You may need this after age 99. Pneumococcal 13-valent conjugate (PCV13) vaccine. One dose is recommended after age 69. Pneumococcal polysaccharide (PPSV23) vaccine. One dose is recommended after age 2. Talk to your health care provider about which screenings and vaccines you need and how often you need them. This information is not intended to replace advice given to you by your health care provider. Make sure you discuss any questions  you have with your health care provider. Document Released: 10/22/2015 Document Revised: 06/14/2016 Document Reviewed:  07/27/2015 Elsevier Interactive Patient Education  2017 [employer].  Fall Prevention in the Home Falls can cause injuries. They can happen to people of all ages. There are many things you can do to make your home safe and to help prevent falls. What can I do on the outside of my home? Regularly fix the edges of walkways and driveways and fix any cracks. Remove anything that might make you trip as you walk through a door, such as a raised step or threshold. Trim any bushes or trees on the path to your home. Use bright outdoor lighting. Clear any walking paths of anything that might make someone trip, such as rocks or tools. Regularly check to see if handrails are loose or broken. Make sure that both sides of any steps have handrails. Any raised decks and porches should have guardrails on the edges. Have any leaves, snow, or ice cleared regularly. Use sand or salt on walking paths during winter. Clean up any spills in your garage right away. This includes oil or grease spills. What can I do in the bathroom? Use night lights. Install grab bars by the toilet and in the tub and shower. Do not use towel bars as grab bars. Use non-skid mats or decals in the tub or shower. If you need to sit down in the shower, use a plastic, non-slip stool. Keep the floor dry. Clean up any water that spills on the floor as soon as it happens. Remove soap buildup in the tub or shower regularly. Attach bath mats securely with double-sided non-slip rug tape. Do not have throw rugs and other things on the floor that can make you trip. What can I do in the bedroom? Use night lights. Make sure that you have a light by your bed that is easy to reach. Do not use any sheets or blankets that are too big for your bed. They should not hang down onto the floor. Have a firm chair that has side arms. You can use this for support while you get dressed. Do not have throw rugs and other things on the floor that can make you  trip. What can I do in the kitchen? Clean up any spills right away. Avoid walking on wet floors. Keep items that you use a lot in easy-to-reach places. If you need to reach something above you, use a strong step stool that has a grab bar. Keep electrical cords out of the way. Do not use floor polish or wax that makes floors slippery. If you must use wax, use non-skid floor wax. Do not have throw rugs and other things on the floor that can make you trip. What can I do with my stairs? Do not leave any items on the stairs. Make sure that there are handrails on both sides of the stairs and use them. Fix handrails that are broken or loose. Make sure that handrails are as long as the stairways. Check any carpeting to make sure that it is firmly attached to the stairs. Fix any carpet that is loose or worn. Avoid having throw rugs at the top or bottom of the stairs. If you do have throw rugs, attach them to the floor with carpet tape. Make sure that you have a light switch at the top of the stairs and the bottom of the stairs. If you do not have them, ask someone to add them for  you. What else can I do to help prevent falls? Wear shoes that: Do not have high heels. Have rubber bottoms. Are comfortable and fit you well. Are closed at the toe. Do not wear sandals. If you use a stepladder: Make sure that it is fully opened. Do not climb a closed stepladder. Make sure that both sides of the stepladder are locked into place. Ask someone to hold it for you, if possible. Clearly mark and make sure that you can see: Any grab bars or handrails. First and last steps. Where the edge of each step is. Use tools that help you move around (mobility aids) if they are needed. These include: Canes. Walkers. Scooters. Crutches. Turn on the lights when you go into a dark area. Replace any light bulbs as soon as they burn out. Set up your furniture so you have a clear path. Avoid moving your furniture  around. If any of your floors are uneven, fix them. If there are any pets around you, be aware of where they are. Review your medicines with your doctor. Some medicines can make you feel dizzy. This can increase your chance of falling. Ask your doctor what other things that you can do to help prevent falls. This information is not intended to replace advice given to you by your health care provider. Make sure you discuss any questions you have with your health care provider. Document Released: 07/22/2009 Document Revised: 03/02/2016 Document Reviewed: 10/30/2014 Elsevier Interactive Patient Education  2017 [employer].

## 2024-04-14 ENCOUNTER — Ambulatory Visit: Admitting: Sports Medicine

## 2024-05-12 ENCOUNTER — Telehealth: Admitting: Psychiatry

## 2024-05-12 ENCOUNTER — Encounter: Payer: Self-pay | Admitting: Psychiatry

## 2024-05-12 DIAGNOSIS — F902 Attention-deficit hyperactivity disorder, combined type: Secondary | ICD-10-CM

## 2024-05-12 DIAGNOSIS — F4001 Agoraphobia with panic disorder: Secondary | ICD-10-CM

## 2024-05-12 DIAGNOSIS — F411 Generalized anxiety disorder: Secondary | ICD-10-CM | POA: Diagnosis not present

## 2024-05-12 DIAGNOSIS — F5105 Insomnia due to other mental disorder: Secondary | ICD-10-CM

## 2024-05-12 DIAGNOSIS — F50811 Binge eating disorder, moderate: Secondary | ICD-10-CM

## 2024-05-12 DIAGNOSIS — G2581 Restless legs syndrome: Secondary | ICD-10-CM

## 2024-05-12 DIAGNOSIS — F3162 Bipolar disorder, current episode mixed, moderate: Secondary | ICD-10-CM

## 2024-05-12 MED ORDER — SERTRALINE HCL 50 MG PO TABS: 50.0000 mg | ORAL_TABLET | Freq: Every day | ORAL | 0 refills | Status: DC

## 2024-05-12 MED ORDER — LUMATEPERONE TOSYLATE 21 MG PO CAPS: 21.0000 mg | ORAL_CAPSULE | Freq: Every day | ORAL | 2 refills | Status: AC

## 2024-05-12 NOTE — Progress Notes (Signed)
 [name] [id number] [birth year] 38 y.o.   Video Visit via My Chart  I connected with pt by video using My Chart and verified that I am speaking with the correct person using two identifiers.   I discussed the limitations, risks, security and privacy concerns of performing an evaluation and management service by My Chart  and the availability of in person appointments. I also discussed with the patient that there may be a patient responsible charge related to this service. The patient expressed understanding and agreed to proceed.  I discussed the assessment and treatment plan with the patient. The patient was provided an opportunity to ask questions and all were answered. The patient agreed with the plan and demonstrated an understanding of the instructions.   The patient was advised to call back or seek an in-person evaluation if the symptoms worsen or if the condition fails to improve as anticipated.  I provided 30 minutes of video time during this encounter.  The patient was located at home and the provider was located office. Session [id number]  Subjective:   Patient ID:  [name] is a 38 y.o. (DOB 1986/08/01) female.  Chief Complaint:  Chief Complaint  Patient presents with   Follow-up   Depression   Anxiety   Jyra Batool Majid presents to the office today for follow-up of bipolar disorder and generalized anxiety disorder. Historically seen with Roslynn Hermanns.  When seen June 05, 2019.  The following changes were made: Change pramipexole  ER and increase to 0.75 mg pm to rid nausea and improve mood in the afternoon.  For tiredness in the afternoon switch Equetro  to 400 mg AM , 1@ 6 and 1 at 11. For panic in daytime start gabapentin  300 mg each am as preventative.  She's only taking it prn now. We continued unchanged Latuda  120 mg and lamotrigine  300 mg twice daily for bipolar disorder. She just got the ER pramipexole  about a week ago.  Can't tell  difference with nausea DT  gastroparesis worse lately with constipation Less afternoon tiredness with current split Equetro  dose.    seen July 22, 2019.  Meds were not changed at that visit.  She called back to October 20 wanting to try valproate because of racing thoughts and difficulty sleeping.  Stating the trazodone  was not working.  She was told it was okay to start Depakote  ER 500 mg nightly and then gradually increased to 1500 mg nightly.  She called back again on October 6 and this physician noted the following: RTC  Pt called to report new med she started having a lot of side effects mood swings, sounds in ears, suicidal thoughts, tremors. Causing issues with husband She is currently taking Depakote  ER 1500 mg nightly. She commits to safety.  The only symptom directly attributable to Depakote  would be the tremor.  However its presence would indicate she is not going to be able to tolerate a higher dose of Depakote  to control her mood symptoms.  Therefore we will wean the Depakote .  In order to control the mood symptoms we will increase the Equetro  at the evening dose which should not cause significant side effects problems during the day but help with mood stability.  This increase should also make it easier to wean the Depakote  quickly.  She agrees to this plan.  She will call back if the suicidal thoughts become too intense to tolerate.  Reduce Depakote  ER 500 mg tablets to 1 a night for 4 nights then stop  Increase Equetro  200 mg capsules to 2 in the morning and 3 nightly.  Cristina Macintosh, MD, DFAPA  She's been back on Equetro  and doing OK. 2 weeks leading to Xmas with a lot of anger and lashing out a good bit.  Had run out of mirapex  and Latuda  for a couple of days in that time frame.  Crying is less now.  No SE CBZ now.  A lot of migraines off her Trokendi  bc risk kidney stones.  Plans Botox for HA.  Usually sleeping OK without trazodone .   Still taking Latuda , buspirone ,  gabapentin , lamotrigine  300 mg BID.    seen 12.30.21 without med change.    12/17/19 appt with the following noted: Since then increased gabapentin  for anxiety to 600 BID-TID which helped but caused some tiredness.  Needing trazodone  more bc EMA/EFA.  She's stopped caffeine .  Mood stability is getting better despite a lot of stress. $ stress and racing thoughts at night and scattered some with stressors.  Depression is mild lately.  Took trip with H last week helped.  Coming up on 2 year anniversary of B's death.  B diabetic died last year with DM and kidney failure. Not as much anger irritability.  Some mood swings early FEB but better now. Custody battle with exH.  He's nicer to her now.   Weight going up. Hates weight gain effects of meds.    Consistent with meds.  No sig SI other than fleeting rarely.  Scattered concentration and racing thoughts randomly.   Sleep OK. Plan: No med changes/ FU 4 mos  06/18/20 TC saying she stopped all psych meds end of August.  06/22/20 appt with the following noted: Had gradually dropped off meds and not sure why.  Stoped Latuda , buspirone , CBZ. Pramipexole ., and lamotrigine .  Sleep Ok without trazodone .  Gabapentin  less tolerated as prn. Stopped Equetro  over a month ago DT insurance.  Back on regular medicare and it might be covered. A lot of stress here lately. Lost father 01/01/20 of MI.  since here and called got propranolol  and it hleped crying.  Also got some clonazepam .   Had given up on life and everything.  Not SI today but has been.  No plan.  Depression and mood swings including anger.  Anxiety. She and therapist say she's depressed and recommended IOP at [facility]. Agrees to retry Seroquel  which helped in the past Plan: Acute decompensation because of psychiatric noncompliance. quetiapine  ER 1 at night for 4 nights, then 2 at night for 4 nights, then 3 at night.  07/13/2020 appointment with the following noted: Patient called in between appointments  complaining of quetiapine  call causing restless legs.  Gabapentin  was called in with instructions to make this side effect resolve. Misunderstood and only taking 300 mg Seroquel  XR for a couple of days. Referred to [facility] and has continued therapy. Feels better on Seroquel .  No longer SI.  More stable.  Less anger.  Depression down to mild generally.  Fewer panic attacks. Sleeps with Seroquel  8 hours.  RLS is managed fairly well at this time. Initially too tired with Seroquel  and better now. Clonazepam  once in 2 weeks for panic driving. Referred to IOP and continued Seroquel  ER 600 HS  08/10/20 appt with the following noted: Insurance wouldn't cover enough of IOP.  Still seeing therapist every 2 weeks. Consistent with Seroquel  XR 600 daily and needs gabapentin  with it DT RLS. No longer taking propranolol . Taking clonazepam  only twice in last month. I feel  pretty stable right now.  First Xmas without father.  Feels better than mos ago.  More stable.  Sleep good.  Hangover if late with Seroquel .  Not unusually angry, under control.  Depression is better.  Occ down day.  Function is pretty good with routine things.   Energy is not great esp in the AM Gabapentin  will make her feel too drunk if dose is too high. SE Seroquel  makes her hungry. Plan: no med changes  09/09/2020 appointment with the following noted: Gabapentin  manages the RLS. Compliant with Seroquel  XR 600 mg HS. Kind of stressed out and sort of moody.  Mild mood swings with some on edge and agitated easily.  No SI. Kind of down over weight management visit and lack of progress.  No energy to want to do anything.  Sleep is fine with Seroquel .  In bed at least 8 hours daily. Used Klonopin  twice since last visit.  Triggered panic attack last week. Back on Reglan  for 2 mos. Plan: Prescription given with schedule to increase to the target dose of Wellbutrin  SR 100 mg tablets 2 twice daily and topiramate  25 mg tablets 2 twice  daily both to help with weight management and energy and mild depression.  12/16/2020 phone call wanting to stop Seroquel .  02/10/2021 appointment with the following noted: Missed appt here.  She stopped Seroquel  on her own gradually and completely about a month ago.  Was taking it off and on.  The gabapentin  was interfering with her ability to function.  Wanting to lay down.  But had to take the gabapentin  bc Seroquel  giving her RLS.  Still very moody but not over the edge like she was in the past. Clonazepam  once or twice weekly.  Stopped gabapentin  bc no longer has RLS off the Seroquel . No trouble sleeping usually. Feels agitated but not manic.  A lot of stress at home. Lives in camper beside in-laws with conflict over $ with them.  A lot of stress right now and hurt and anger with them over things. Not many friends to talk to and not a lot of conversation with H.  Feels like needs to restart therapy. Doing video therapy with Christina Hussami. Wellbutrin  seemed to help mental state some but didn't help weight much. Plan: Consider retrying Vraylar  bc weight gain.  Yes Start 1.5 mg for 1 week then 3 mg daily.  04/14/2021 appointment with the following noted: Seems to be working good with Vraylar  1.5 mg and stopped Wellbutrin . No SE. She started phentermine  from Dr. Prentiss with Topomax. No SE mania and no sig benefit at 1.2 tablet.  No SE. Sleep normal and not manic. Depression is better with Vraylar  but down a couple of times weekly and still a lot of stress at home which affects mood. Mo in law is source of stress bc lives in camper on her property.  She hates me. Won't allow her to use washer.  She's the reason pt needs clonazepam . $ stress.  Ran out of food stamps.  All 5 kids under their care for the summer.  She cooks for M in Social worker but won't help with cost. Disc D's psych provblems Plan no med changes  09/15/2021 appt noted:   Rougher time last few mos. Still seeing therapist. Hand  surgery October and got depressed and gained weight and stopped meds for a week or 2.  Couldn't do anything after the surgery for awhile.  Didn't feel family was helping.  Worry over $.  H  not working much and she's on disability.  Still living in camper which is falling apart. Has talked to H about helping and he's looking for a job.  Stress is usual cause of her depression and anxiety. Occ panic attacks.  Some isolation lately. ON Vraylar  3 mg daily.  No SE. Meds covered well DT MCR and MCD. M says she can tell when pt misses meds bc gets more emotional and posts on Facebook change.  03/29/22 appt noted: Mostly compliant with Vraylar .  No SE. Stressed last couple of weeks and a little more depressed and irritable. Small things set me off.  Feels depression is a factor and more tearful.  Stress between she and H and housing problems.  Living in camper for 2 years.  Failed attempt to move. Taking clonazepam  0.5 mg 3-4 times per week.   Therapist Plan: Therefore increase Vraylar  to 4.5 mg daily  06/22/2022 appointment noted: Current psych meds are Vraylar  4.5 mg daily, clonazepam  0.5 mg 3 times daily as needed anxiety Clonazepam  now only a couple of times per month.   Did move into her own place for a couple of weeks. Kids are really happy alos. Did fine with increase Vraylar  and feels it really helped.  Mood good and more stable. No SE. She asked about ADD bc forgets a lot and easily distracted and poor memory at times. For example when cleaning will tend to jump from one task to another.  Always tends to have racing thoughts.  Always tended to want to keep doing things.  Had these sx for years as long as can remember.  Hard to watch TV show bc always feels she has to be doing something. Stayed on task better and better mood with phentermine  including less irritability.  Took up to 37.5 mg daily. Still too irritable with kids. Sleep is good usually bc tired when kids go to bed.  DM can  interfere with sleep bc nocturia.  Total 6-8 hours of sleep.   Still struggling with weight.   Forgets things kids and H tell her.   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 Prentiss and tried vyvanse  but out of stock. Anxiety is better than it was.  Still kind of moody a lot and forgetful a lot.  Kids ask her to do things but she forgets. Still on Vryalar 4.5 mg daily wihtout crying or sadness. No Se Plan: For irritability increase Vraylar  to 6 mg daily Vyvanse  RX  01/03/23 appt noted: Dr. Prentiss got her increased to Vyvanse  60 mg AM.   Was out of Vraylar  for a couple of weeks DT [facility] problems but back on for 5 days. Not irritable much anymore but is solemn some.  While out of it more depressed. Don't feel a lot of joy in her life since she was here.   Not needing to take clonazepam  bc anxiety is better.   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 He hasn't gotten paid yet so $ stress. Plans to seek marital therapy with pastor. Asks about something to lift her mood. Plan: flat so reduce Vraylar  to 4.5  03/27/23 appt noted: Last filled Vyvanse  60 01/04/23.  Adderall XR 20 on 02/2023 by Dr. Prentiss bc insurance Uses clonazepam  0.5 mg about once per week.  More anxiety over the last month. Some worry and panic.  Last week vacation.  Anxiety thinking of  random things like being too high in hotel.  Occ panic weekly affecting chest and SOB.  Both triggered and spontaneous. Reduced Vraylar  to 4.5 and still feels flat.  Not a lot of joy in her life without change since last visist. Don't smile much.   Compliant.  More sad lately. Kids just got out of school and a lot of changes next year.   Worry over kids and D dep. Stress thinking of leaving H.  Cries a lot. Plan: Continue Vraylar  to 4.5 mg daily For dep and panic resume trial Zoloft  50 mg daily for 2 weeks then 100 mg daily for panic and dep off  label.  05/28/23 appt noted: Psych med: Vraylar  4.5, sertraline  100, switched from Vyvanse  to Adderall XR 20 AM by Dr. Prentiss Less clonazepam  with Zoloft  helping.  Better mood and less anxiety with sertraline  100 mg daily.   No sig SE with it.  Still some agitation but not as much. Still happy with Vraylar  4.5 mg less flat than with 6 mg daily.  No sig crying over the last month.  Panic is better but not gone.  Can be spontaneous.    Can occur when overwhelmed and stressed.   Needs to resume therapy [facility] at [facility]. Stepson 18 is living with them and starting college and not well prepared for this.  He doesn't have license yet.   Gets drop off in the afternoon with switch to Adderall XR 20 mg AM.   08/28/23 appt noted: Psych hosp since here 07/08/23.  Had tried to talk to mother and husband.  Lost her therapist.  Victorine to see someone now.  Was feeling dep and like no friends.  Family didn't seem to believe her or take her seriously.  Then argument with H and pushed over the edge and took OD clonazepam  #5.  Intention was suicide. Psych in hosp reduced Vraylar  to 3 mg and reduced sertraline  to 50 mg daily.   Feels less on edge with 50 vs 100 mg Zoloft . 3rd H is jealous of exH who has her D on the weekends.  He got upset she was talking to [facility].  A lot of arguements with current H , [place] are about his jealousy.   Caleb's son 2 yo living with them and it is stressful. Things are better at home than they were.    Was highly stressed which flares bipolar sx.  Therapist will help.   Don't feel as depressed since out of the hospital.  Visited a friend helped.   Current psych meds:  Vraylar  3, sertraline  50 mg daily.  Dr. Prentiss Rx Adderall XR 20 AM is helping focus and appetite. No SE Really scatter brained without Adderall.   Prefers Vyvanse  but insurance covers poorly. Sleep good .  Sometimes doesn't want to get OOB.   Still has a lot of anxiety and gets worked up too much.  Irritable not  anxious . Not flat. No panic.   New therapist next week, Skylar Rheinhart, [place].  11/13/23 appt noted: TC called complaining of anxiety and wanting clonazepam .   MD resp:  Will not RX clonazepam  again bc she overdosed on it in suicide attempt.  Also we shoulc not combine BZ with stimulants and sh'e taking Adderall.  Will agree to clonidine  0.1 mg twice daily as needed for anxiety.  I will send in RX.      12/12/23 appt noted: Current psych meds:  Vraylar  3, sertraline  50 mg daily.  Dr.  Prentiss Rx Adderall XR 20 AM is helping focus and appetite.  Clonidine  0.1 mg BID prn anxiety. Wellness doc rx propranolol  for anxiety and tachycardia. 20 mg AM and helped a lot Clonidine  helps if still anxious. M has CVD and worry over her.  Lost father a few years ago with MI.  Was at ER last night several hours with M.  Talked with M about moving in with her.  Decided to leave her H. Asks about med Caplyta .  Lately sleep px.    Haven't been her true self in a long time.  Dont' smile much.  Don't laugh. H's son lives with them and feels so much pressure and anxiety from that situation.  Not happy with it.  H doesn't talk to her at home.   Not seeeing a counselor bc she lost her DT moving.  Would like a therapist. 3rd marriage.  No kids together Plan: Disc alternative to Caplyta  bc she doesn't have much joy.   Wlll DC Vraylar  and try caplyta  1 daily Continue Zoloft  50 mg daily.  05/12/24 appt noted:  Med: Dr. Prentiss FATE Vyvanse  40, sertaline 50, clonidine  0.1 mg prn anxiety,  stopped Vraylar  to try Caplyta  42 mg AM. SE tired in the morning. Not as flat as when on Vraylar .  More range of emotions vs Vraylar .  Still taking Zoloft .   Anxiety is good overall.   Moved and change has been helpful.   Still seeing [facility] intherapy.   No unusual mood swings.  Hard to get up in the morning.    Remarried April 2020.  39 yo D & 39 yo D ADD .  One of the kids father has ADHD Disability review approved mostly for  psych reasons.  Going to [facility] weight management and just started vitamin D . Level 24.5.  Past Psychiatric Medication Trials:  lamotrigine  300 twice daily, Equetro  900 daily,  Depakote  1500 SE,  History topiramate  for migraine Vraylar  6 flat,  Abilify  15,  Seroquel  worked but stopped DT insurance and SE RLS and hunger,  Latuda  120, lithium felt slowed down,   Sertraline  100 benefit  with Vraylar ,  duloxetine ,  Wellbutrin  300 min effect gabapentin  600 mg 3 times daily tiredness,  buspirone  30 twice daily,,  clonazepam , trazodone , Xanax  , hydroxyzine  NR, Hx phentermine  seemed to help energy and mood.  Didn't help wt loss.  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  Cardiovascular:  Negative for palpitations.  Gastrointestinal:  Negative for nausea and vomiting.  Musculoskeletal:  Positive for arthralgias.       Recent hand surgery healing  Psychiatric/Behavioral:  Negative for agitation, behavioral problems and sleep disturbance. The patient is nervous/anxious.     Medications: I have reviewed the patient's current medications.  Current Outpatient Medications  Medication Sig Dispense Refill   albuterol  (PROVENTIL ) (2.5 MG/3ML) 0.083% nebulizer solution Take 3 mLs (2.5 mg total) by nebulization every 6 (six) hours as needed for wheezing or shortness of breath. 150 mL 1   albuterol  (VENTOLIN  HFA) 108 (90 Base) MCG/ACT inhaler TAKE 2 PUFFS BY MOUTH EVERY 6 HOURS AS NEEDED FOR WHEEZE OR SHORTNESS OF BREATH 18 each 2   aspirin  81 MG chewable tablet Chew 1 tablet (81 mg total) by mouth at bedtime.     Budeson-Glycopyrrol-Formoterol  (  BREZTRI AEROSPHERE) 160-9-4.8 MCG/ACT AERO Inhale 2 puffs into the lungs 2 (two) times daily.     cloNIDine  (CATAPRES ) 0.1 MG  tablet Take 1 tablet (0.1 mg total) by mouth 2 (two) times daily as needed (anxiety). 60 tablet 0   FASENRA 30 MG/ML SOSY Inject into the skin every 3 (three) months.     Galcanezumab -gnlm (EMGALITY ) 120 MG/ML SOAJ Inject 120 mg into the skin every 30 (thirty) days. 1.12 mL 11   GLUCAGON EMERGENCY 1 MG injection Inject 1 mg into the skin as needed (hypoglycemia.).     glucose blood (ACCU-CHEK GUIDE) test strip Use to check blood sugar 3 time a day 300 strip 4   insulin  glargine, 1 Unit Dial, (TOUJEO  SOLOSTAR) 300 UNIT/ML Solostar Pen Inject 15 Units into the skin daily. 4.5 mL 0   insulin  lispro (HUMALOG ) 100 UNIT/ML injection Inject up to 150 units daily according to insulin  pump settings. 10 mL 0   levonorgestrel  (MIRENA , 52 MG,) 20 MCG/24HR IUD 1 each by Intrauterine route once.     losartan  (COZAAR ) 25 MG tablet Take 12.5 mg by mouth daily. Pt takes 12.5 mg. 12/31/23.     meloxicam  (MOBIC ) 15 MG tablet Take 1 tablet (15 mg total) by mouth daily. 30 tablet 0   montelukast  (SINGULAIR ) 10 MG tablet Take 1 tablet (10 mg total) by mouth at bedtime. 90 tablet 1   MOUNJARO  10 MG/0.5ML Pen Inject 10 mg into the skin once a week.     Ubrogepant  (UBRELVY ) 100 MG TABS Take 1 tablet (100 mg total) by mouth daily as needed (migraine headaches.). 30 tablet 3   valACYclovir  (VALTREX ) 1000 MG tablet 2 tabs po bid x 1 prn breakout 30 tablet 3   valACYclovir  (VALTREX ) 1000 MG tablet Take 2 tablets (2,000 mg total) by mouth 2 (two) times daily. 4 tablet 3   lumateperone  tosylate 21 MG CAPS Take 21 mg by mouth daily. 30 capsule 2   pravastatin  (PRAVACHOL ) 20 MG tablet Take 1 tablet (20 mg total) by mouth at bedtime. 90 tablet 3   sertraline  (ZOLOFT ) 50 MG tablet Take 1 tablet (50 mg total) by mouth daily. 90 tablet 0   No current facility-administered medications for this visit.    Medication Side Effects: tired in the afternoon.  Allergies:  Allergies  Allergen Reactions   Nsaids Other (See Comments)     Due to Renal disease   Other    Toradol  [Ketorolac  Tromethamine ]     Cannot take d/t kidney issues    Ceftin Rash    Past Medical History:  Diagnosis Date   Anemia    Anemia of chronic disease 06/07/2012   Formatting of this note might be different from the original. Hematology consultation 07-13-16 (see note)--> Anemia of chronic disease Required 3 transfusions during pregnancy  Last Assessment & Plan:  Formatting of this note might be different from the original. She has now required 3 blood transfusions this pregnancy for her anemia of chronic disease. The most recent was one week ago when she bec   Anxiety    Asthma    Asthma 11/10/2013   Back pain    Bipolar 1 disorder (HCC)    Chest pain    Chronic renal failure syndrome, stage 3 (moderate) (HCC)    Colles' fracture of left radius 10/11/2013   Complication of anesthesia    Constipation    Depression    Depression    Diabetic gastroparesis (HCC)    Diabetic neuropathy, type  I diabetes mellitus (HCC)    Diabetic ulcer of right great toe (HCC) 08/27/2020   Edema, lower extremity    Gallbladder disease    Gastroparesis    GERD (gastroesophageal reflux disease)    Headache(784.0)    Hypertension    Joint pain    Kidney stones    Palpitations    Polyneuropathy in diabetes(357.2)    PONV (postoperative nausea and vomiting)    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)    S/P carpal tunnel release left 10/16/19 11/04/2019   Shortness of breath    Tachycardia    baseline tachycardia    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)     Family History  Problem Relation Age of Onset   Diabetes Mother        type 2   Hypertension Mother    Obesity Mother    Diabetes Father    Hypertension Father    High Cholesterol Father    Sleep apnea Father    Obesity Father    Heart disease Father    Heart attack Father    Diabetes Brother        type 1   Renal Disease Brother        renal failure    Hypertension Brother    Hypertension Maternal Grandmother    Breast cancer Maternal Aunt 64   Colon cancer Maternal Aunt    Uterine cancer Maternal Aunt 12   Breast cancer Other    Rectal cancer Neg Hx    Esophageal cancer Neg Hx     Social History   Socioeconomic History   Marital status: Married    Spouse name: Not on file   Number of children: 2   Years of education: 12   Highest education level: 12th grade  Occupational History   Occupation: Arts development officer  Tobacco Use   Smoking status: Never   Smokeless tobacco: Never  Vaping Use   Vaping status: Never Used  Substance and Sexual Activity   Alcohol use: No   Drug use: No   Sexual activity: Yes    Partners: Male    Birth control/protection: I.U.D.    Comment: Mirena   Other Topics Concern   Not on file  Social History Narrative   Pt has a daughter and boyfriend.    Dad passed away suddenly heart attack 12/2019   Social Drivers of Health   Financial Resource Strain: Low Risk  (04/10/2024)   Overall Financial Resource Strain (CARDIA)    Difficulty of Paying Living Expenses: Not hard at all  Food Insecurity: No Food Insecurity (04/10/2024)   Hunger Vital Sign    Worried About Running Out of Food in the Last Year: Never true    Ran Out of Food in the Last Year: Never true  Transportation Needs: No Transportation Needs (04/10/2024)   PRAPARE - Administrator, Civil Service (Medical): No    Lack of Transportation (Non-Medical): No  Physical Activity: Insufficiently Active (04/10/2024)   Exercise Vital Sign    Days of Exercise per Week: 3 days    Minutes of Exercise per Session: 30 min  Stress: No Stress Concern Present (04/10/2024)   [employer] of Occupational Health - Occupational Stress Questionnaire    Feeling of Stress: Not at all  Social Connections: Unknown (04/10/2024)   Social Connection and Isolation Panel    Frequency of Communication with Friends and Family: More than three times a week  Frequency of Social Gatherings with Friends and Family: Three times a week    Attends Religious Services: More than 4 times per year    Active Member of Clubs or Organizations: No    Attends Banker Meetings: Never    Marital Status: Not on file  Intimate Partner Violence: Not At Risk (04/10/2024)   Humiliation, Afraid, Rape, and Kick questionnaire    Fear of Current or Ex-Partner: No    Emotionally Abused: No    Physically Abused: No    Sexually Abused: No    Past Medical History, Surgical history, Social history, and Family history were reviewed and updated as appropriate.   Please see review of systems for further details on the patient's review from today.   Objective:   Physical Exam:  There were no vitals taken for this visit.  Physical Exam Constitutional:      General: She is not in acute distress. Musculoskeletal:        General: No deformity.  Neurological:     Mental Status: She is alert and oriented to person, place, and time.     Cranial Nerves: No dysarthria.     Coordination: Coordination normal.  Psychiatric:        Attention and Perception: Attention and perception normal. She does not perceive auditory or visual hallucinations.        Mood and Affect: Mood is anxious and depressed. Affect is not labile, blunt, angry, tearful or inappropriate.        Speech: Speech normal.        Behavior: Behavior normal. Behavior is cooperative.        Thought Content: Thought content normal. Thought content is not paranoid or delusional. Thought content does not include homicidal or suicidal ideation. Thought content does not include suicidal plan.        Cognition and Memory: Cognition and memory normal.        Judgment: Judgment normal.     Comments: Insight intact Not sig Irritable  depressed andfeels flat Under marital stress     Lab Review:     Component Value Date/Time   NA 132 (L) 07/08/2023 0917   NA 137 04/04/2021 1250   K 4.0 07/08/2023 0917    CL 97 (L) 07/08/2023 0917   CO2 29 07/08/2023 0917   GLUCOSE 291 (H) 07/08/2023 0917   BUN 22 (H) 07/08/2023 0917   BUN 18 04/04/2021 1250   CREATININE 0.95 07/08/2023 0917   CREATININE 1.16 (H) 10/19/2022 1457   CALCIUM 8.3 (L) 07/08/2023 0917   PROT 6.4 (L) 07/08/2023 0434   PROT 7.1 04/04/2021 1250   ALBUMIN 3.5 07/08/2023 0434   ALBUMIN 4.5 04/04/2021 1250   AST 18 07/08/2023 0434   ALT 18 07/08/2023 0434   ALKPHOS 108 07/08/2023 0434   BILITOT 0.5 07/08/2023 0434   BILITOT 0.4 04/04/2021 1250   GFRNONAA >60 07/08/2023 0917   GFRNONAA 36 (L) 06/17/2018 1231   GFRAA 50 (L) 07/29/2020 1246   GFRAA 42 (L) 06/17/2018 1231       Component Value Date/Time   WBC 9.6 07/08/2023 0441   RBC 5.17 (H) 07/08/2023 0441   HGB 14.7 07/08/2023 0441   HGB 15.2 04/04/2021 1250   HCT 44.7 07/08/2023 0441   HCT 45.8 04/04/2021 1250   PLT 317 07/08/2023 0441   PLT 287 04/04/2021 1250   MCV 86.5 07/08/2023 0441   MCV 87 04/04/2021 1250   MCH 28.4 07/08/2023 0441   MCHC 32.9  07/08/2023 0441   RDW 12.7 07/08/2023 0441   RDW 12.4 04/04/2021 1250   LYMPHSABS 2.5 07/08/2023 0441   LYMPHSABS 2.4 04/04/2021 1250   MONOABS 0.9 07/08/2023 0441   EOSABS 0.2 07/08/2023 0441   EOSABS 0.3 04/04/2021 1250   BASOSABS 0.1 07/08/2023 0441   BASOSABS 0.1 04/04/2021 1250    No results found for: POCLITH, LITHIUM   No results found for: PHENYTOIN, PHENOBARB, VALPROATE, CBMZ   .res Assessment: Plan:    Erielle was seen today for follow-up, depression and anxiety.  Diagnoses and all orders for this visit:  Bipolar 1 disorder, mixed, moderate (HCC) -     lumateperone  tosylate 21 MG CAPS; Take 21 mg by mouth daily.  Generalized anxiety disorder -     lumateperone  tosylate 21 MG CAPS; Take 21 mg by mouth daily. -     sertraline  (ZOLOFT ) 50 MG tablet; Take 1 tablet (50 mg total) by mouth daily.  Attention deficit hyperactivity disorder (ADHD), combined type  Panic disorder with  agoraphobia -     sertraline  (ZOLOFT ) 50 MG tablet; Take 1 tablet (50 mg total) by mouth daily.  Insomnia due to mental condition  Binge-eating disorder, moderate  Restless leg syndrome, controlled     30 min video face to face time with patient was spent on counseling and coordination of care. We discussed Patient with a history of multiple med failures and history of psychiatric hospitalization and suicide attempts recent worsening of bipolar depression.  Chronic stressors contribute to mood and anxiety problems.   Concern about potential weight gain with medications.  She has taken the major mood stabilizers with low weight gain potential.  More range of emotions on Caplyta  vs Vraylar  but sleepy in AM So reduce to 21 mg HS.  Disc risk relapse. Continue Zoloft  50 mg daily. Disc risk mania.   Disc poss of ADD but risk ADD meds causing mood swings.  She reports hx mood benefit with phentermine  but minimal wt loss.   ADD scale today  I'm not comfortable using short acting stimulants in this pt.  Shortage of Vyvanse  at this time but consider.  This could help binge eating and cognitive complaints.    Still seeing Dr. Prentiss for wt loss.    OK trial of Vyvanse  for BED and ADD.  Will try to get it again. ADHD SRS : inattentive 17, hyperactive 32= total 49, high Rx Vyvanse  l from Dr. Prentiss   Discussed potential metabolic side effects associated with atypical antipsychotics, as well as potential risk for movement side effects. Advised pt to contact office if movement side effects occur.    Seeing Deane Sprang in counseling.  RLS Not a problem now.  Disc the large bill here and not making payments.  She has made a payment since here.    Follow-up 2 months  Cristina Macintosh, MD, DFAPA   Please see After Visit Summary for patient specific instructions.  Future Appointments  Date Time Provider Department Center  05/14/2024  1:00 PM Sprang Almarie DASEN [facility] None  05/27/2024  10:00 AM Sprang Almarie DASEN, [facility] CP-CP None  07/03/2024 10:30 AM Duanne Butler DASEN, MD [facility]  04/16/2025 11:40 AM [facility]-ANNUAL WELLNESS VISIT [facility]     No orders of the defined types were placed in this encounter.   -------------------------------d

## 2024-05-14 ENCOUNTER — Ambulatory Visit (INDEPENDENT_AMBULATORY_CARE_PROVIDER_SITE_OTHER): Admitting: Professional Counselor

## 2024-05-20 ENCOUNTER — Encounter: Payer: Self-pay | Admitting: Sports Medicine

## 2024-05-27 ENCOUNTER — Encounter: Payer: Self-pay | Admitting: Professional Counselor

## 2024-05-27 ENCOUNTER — Ambulatory Visit (INDEPENDENT_AMBULATORY_CARE_PROVIDER_SITE_OTHER): Admitting: Professional Counselor

## 2024-05-27 DIAGNOSIS — F319 Bipolar disorder, unspecified: Secondary | ICD-10-CM

## 2024-05-27 NOTE — Progress Notes (Signed)
      [facility] Counselor/Therapist Progress Note  Patient ID: [name], MRN: [id number],    Date: 05/30/2024  Time Spent: 10:15 AM to 11:11 AM  Treatment Type: Individual Therapy  Reported Symptoms: Tearfulness, frustration, irritability, worries, restlessness, anxiousness, parenting concerns, interpersonal concerns, stress  Mental Status Exam:  Appearance:   Casual     Behavior:  Appropriate, Sharing, and Motivated  Motor:  Normal  Speech/Language:   Clear and Coherent and Normal Rate  Affect:  Tearful  Mood:  sad  Thought process:  normal  Thought content:    WNL  Sensory/Perceptual disturbances:    WNL  Orientation:  oriented to person, place, time/date, and situation  Attention:  Good  Concentration:  Good  Memory:  WNL  Fund of knowledge:   Good  Insight:    Good  Judgment:   Good  Impulse Control:  Good   Risk Assessment: Danger to Self:  No Self-injurious Behavior: No Danger to Others: No Duty to Warn:no Physical Aggression / Violence:No  Access to Firearms a concern: No  Gang Involvement:No   Subjective: Patient presented to session to address concerns of bipolar disorder.  She reported mixed progress at this time.  She reported mood to be managed with medications fairly well at this time, however voiced experience of agitation around assorted life concerns.  She voiced worries and processed experience of parenting concerns in particular, and counselor actively listened and helped patient with strategies to approach conversation regarding healthy boundaries and safe distancing with one daughter's interpersonal concerns, and discussed strategies to increase family bond including more quality family time.  Counselor affirmed and reinforced patient ongoing parenting strengths including honesty, perseverance, attentiveness, caring, openness and patience.  Interventions: Assertiveness/Communication, Solution-Oriented/Positive Psychology,  Humanistic/Existential, and Insight-Oriented  Diagnosis:   ICD-10-CM   1. Bipolar I disorder (HCC)  F31.9       Plan: Patient is scheduled for follow-up; continue process work and developing coping skills.  STG between sessions to continue to approach parenting conversations with patients and nonjudgmentalism while maintaining rules and instincts around holistic safety.  Almarie ONEIDA Sprang, [facility]

## 2024-06-06 NOTE — Progress Notes (Signed)
 [name] [address] [place] [address] Phone: [phone]   Assessment and Plan:     1. Left hip pain (Primary) 2. Muscle strain of gluteal region, left, subsequent encounter 3. Greater trochanteric bursitis of left hip 4. Chronic left-sided low back pain with left-sided sciatica - Chronic with exacerbation, subsequent visit - Continued left hip pain.  Patient had significant relief from intra-articular left hip CSI on 03/14/2024, however relief only lasted for 4 weeks with pain returning and worsening over the past 2 months.  Prior left hip MRI from 06/29/2023 only showed mild gluteus minimus tendinosis.  I do not believe this finding would be causing this degree of recurrent pain for patient - Patient elected for repeat greater trochanteric CSI.  Tolerated well per note below.  CSI may temporarily increase blood glucose in patient with past medical history of DM type I - Do not recommend chronic NSAID use due to past medical history of CKD - Continue HEP - Based on continued left hip pain without clear origin, progression of low back pain, we will obtain lumbar x-ray at today's visit and recommend further evaluation with lumbar MRI due to failure to improve despite >6 weeks of conservative therapy, pain >6/10, pain with day-to-day activities   Procedure: Greater trochanteric bursal injection Side: Left  Risks explained and consent was given verbally. The site was cleaned with alcohol prep. A steroid injection was performed with patient in the lateral side-lying position at area of maximum tenderness over greater trochanter using 2mL of 1% lidocaine  without epinephrine and 1mL of kenalog  40mg /ml. This was well tolerated.  Needle was removed, hemostasis achieved, and post injection instructions were explained.  Pt was advised to call or return to clinic if these symptoms worsen or fail to improve as anticipated.    Pertinent  previous records reviewed include none   Follow Up: 5 days after MRI to review results and discuss treatment plan   Subjective:   I, [name], am serving as a Neurosurgeon for Doctor Morene Mace   Chief Complaint: leg pain    HPI:    02/28/2023 Patient is a 38 year old female complaining of leg pain. Patient states that she had left pain for years, she was dx with bursitis, she used to get CSI but the pain has changed, she had pain when sitting for long time, and then has pain when trying to walk after sitting , pain radiates to the back of the leg, sleep is hard she is a side sleeper, no numbness or tingling, tylenol  doesn't help with the pain, she states she used to hear hip pop but doesn't do that stretching anymore that would cause the popping, she is TTP in that area , she states she can feel a knot    03/26/2023 Patient states she had some pain while on vacation. Back pain has been more frequent when she is in a sitting position for a long period of time. Left hip has been killing her   04/17/2023 Patient states after CSI pain calmed down . Went swimming yesterday so she is a little flared    06/20/2023 Patient states past couple of weeks leg pain has flared. PT tomorrow    07/19/2023 Patient states she is doing okay MRI caused some pain for a couple of days     08/21/2023 Patient states she is getting better. Notices pain when she is walking or sitting for long periods of time  01/01/2024 Patient states right now it is ok. It has become really bothersome if she sits too long. Sleep is uncomfortable where she has to keep moving around to set comfortable.  Started to flare back up this past month but was doing well before then. Had some pain in the front almost on the bone on the left side.    01/29/2024 Patient states she is doing a little better. Sat and Sunday she was flared was driving back from the beach. Started PT    03/13/2024 Patient states in a little pain. Been  doing physical therapy but it doesn't seem like that it is helping. The front pain in the hip area has gotten worse especially when she tries to get up and walk. Would like to know if it is time for another injection.   06/10/2024 Patient states she has had increased amount of pain at night . Wider area of pain now     Relevant Historical Information: Hypertension, DM type I, CKD  Additional pertinent review of systems negative.   Current Outpatient Medications:    albuterol  (PROVENTIL ) (2.5 MG/3ML) 0.083% nebulizer solution, Take 3 mLs (2.5 mg total) by nebulization every 6 (six) hours as needed for wheezing or shortness of breath., Disp: 150 mL, Rfl: 1   albuterol  (VENTOLIN  HFA) 108 (90 Base) MCG/ACT inhaler, TAKE 2 PUFFS BY MOUTH EVERY 6 HOURS AS NEEDED FOR WHEEZE OR SHORTNESS OF BREATH, Disp: 18 each, Rfl: 2   aspirin  81 MG chewable tablet, Chew 1 tablet (81 mg total) by mouth at bedtime., Disp: , Rfl:    Budeson-Glycopyrrol-Formoterol  (BREZTRI AEROSPHERE) 160-9-4.8 MCG/ACT AERO, Inhale 2 puffs into the lungs 2 (two) times daily., Disp: , Rfl:    cloNIDine  (CATAPRES ) 0.1 MG tablet, Take 1 tablet (0.1 mg total) by mouth 2 (two) times daily as needed (anxiety)., Disp: 60 tablet, Rfl: 0   FASENRA 30 MG/ML SOSY, Inject into the skin every 3 (three) months., Disp: , Rfl:    Galcanezumab -gnlm (EMGALITY ) 120 MG/ML SOAJ, Inject 120 mg into the skin every 30 (thirty) days., Disp: 1.12 mL, Rfl: 11   GLUCAGON EMERGENCY 1 MG injection, Inject 1 mg into the skin as needed (hypoglycemia.)., Disp: , Rfl:    glucose blood (ACCU-CHEK GUIDE) test strip, Use to check blood sugar 3 time a day, Disp: 300 strip, Rfl: 4   insulin  glargine, 1 Unit Dial, (TOUJEO  SOLOSTAR) 300 UNIT/ML Solostar Pen, Inject 15 Units into the skin daily., Disp: 4.5 mL, Rfl: 0   insulin  lispro (HUMALOG ) 100 UNIT/ML injection, Inject up to 150 units daily according to insulin  pump settings., Disp: 10 mL, Rfl: 0   levonorgestrel  (MIRENA ,  52 MG,) 20 MCG/24HR IUD, 1 each by Intrauterine route once., Disp: , Rfl:    losartan  (COZAAR ) 25 MG tablet, Take 12.5 mg by mouth daily. Pt takes 12.5 mg. 12/31/23., Disp: , Rfl:    lumateperone  tosylate 21 MG CAPS, Take 21 mg by mouth daily., Disp: 30 capsule, Rfl: 2   meloxicam  (MOBIC ) 15 MG tablet, Take 1 tablet (15 mg total) by mouth daily., Disp: 30 tablet, Rfl: 0   montelukast  (SINGULAIR ) 10 MG tablet, Take 1 tablet (10 mg total) by mouth at bedtime., Disp: 90 tablet, Rfl: 1   MOUNJARO  10 MG/0.5ML Pen, Inject 10 mg into the skin once a week., Disp: , Rfl:    pravastatin  (PRAVACHOL ) 20 MG tablet, Take 1 tablet (20 mg total) by mouth at bedtime., Disp: 90 tablet, Rfl: 3   sertraline  (ZOLOFT ) 50  MG tablet, Take 1 tablet (50 mg total) by mouth daily., Disp: 90 tablet, Rfl: 0   Ubrogepant  (UBRELVY ) 100 MG TABS, Take 1 tablet (100 mg total) by mouth daily as needed (migraine headaches.)., Disp: 30 tablet, Rfl: 3   valACYclovir  (VALTREX ) 1000 MG tablet, 2 tabs po bid x 1 prn breakout, Disp: 30 tablet, Rfl: 3   valACYclovir  (VALTREX ) 1000 MG tablet, Take 2 tablets (2,000 mg total) by mouth 2 (two) times daily., Disp: 4 tablet, Rfl: 3   Objective:     Vitals:   06/10/24 1103  Pulse: 97  SpO2: 97%  Weight: 157 lb (71.2 kg)  Height: 5' 6 (1.676 m)      Body mass index is 25.34 kg/m.    Physical Exam:    General: awake, alert, and oriented no acute distress, nontoxic Skin: no suspicious lesions or rashes Neuro:sensation intact distally with no deficits, normal muscle tone, no atrophy, strength 5/5 in all tested lower ext groups Psych: normal mood and affect, speech clear   Left hip: No deformity, swelling or wasting ROM Flexion 75, ext 20, IR 35, ER 30 TTP hip flexors, greater trochanter, gluteal musculature,  lumbar spine NTTP over the   si joint, Negative log roll with FROM   Positive FABER for posterior hip tension Negative FADIR   Negative Piriformis test Positive  trendelenberg on left Gait antalgic, favoring right leg Straight leg positive on left   Electronically signed by:  Odis Mace D.CLEMENTEEN [facility] 11:22 AM 06/10/24

## 2024-06-10 ENCOUNTER — Encounter: Payer: Self-pay | Admitting: Sports Medicine

## 2024-06-10 ENCOUNTER — Ambulatory Visit (INDEPENDENT_AMBULATORY_CARE_PROVIDER_SITE_OTHER): Admitting: Sports Medicine

## 2024-06-10 ENCOUNTER — Ambulatory Visit

## 2024-06-10 VITALS — HR 97 | Ht 66.0 in | Wt 157.0 lb

## 2024-06-10 DIAGNOSIS — G8929 Other chronic pain: Secondary | ICD-10-CM

## 2024-06-10 DIAGNOSIS — S76012D Strain of muscle, fascia and tendon of left hip, subsequent encounter: Secondary | ICD-10-CM

## 2024-06-10 DIAGNOSIS — M7062 Trochanteric bursitis, left hip: Secondary | ICD-10-CM | POA: Diagnosis not present

## 2024-06-10 DIAGNOSIS — M25552 Pain in left hip: Secondary | ICD-10-CM | POA: Diagnosis not present

## 2024-06-10 DIAGNOSIS — M5442 Lumbago with sciatica, left side: Secondary | ICD-10-CM

## 2024-06-10 NOTE — Patient Instructions (Signed)
 Lumbar xray on the way out. Lumbar MRI to [facility]. Follow up 5 days after MRI.

## 2024-06-17 ENCOUNTER — Other Ambulatory Visit

## 2024-06-19 ENCOUNTER — Ambulatory Visit: Payer: Self-pay | Admitting: Sports Medicine

## 2024-06-30 ENCOUNTER — Ambulatory Visit (INDEPENDENT_AMBULATORY_CARE_PROVIDER_SITE_OTHER)

## 2024-06-30 DIAGNOSIS — G8929 Other chronic pain: Secondary | ICD-10-CM

## 2024-06-30 DIAGNOSIS — M5442 Lumbago with sciatica, left side: Secondary | ICD-10-CM

## 2024-07-03 ENCOUNTER — Ambulatory Visit: Admitting: Family Medicine

## 2024-07-03 NOTE — Progress Notes (Unsigned)
 Ben Jackson D.[facility] [address] [place] [address] Phone: [phone]   Assessment and Plan:     1. Chronic bilateral low back pain without sciatica (Primary) -Chronic with exacerbation, subsequent visit - Continued low back pain most consistent with musculoskeletal dysfunction, flare of mild degenerative changes in lumbar spine with mild DDD and facet arthrosis as seen on lumbar MRI from 06/30/2024 - Start HEP and physical therapy for core and back - Do not recommend chronic NSAID use due to past medical history of CKD.  Recommend limiting prednisone  use with past medical history of DM type I  15 additional minutes spent for educating Therapeutic Home Exercise Program.  This included exercises focusing on stretching, strengthening, with focus on eccentric aspects.   Long term goals include an improvement in range of motion, strength, endurance as well as avoiding reinjury. Patient's frequency would include in 1-2 times a day, 3-5 times a week for a duration of 6-12 weeks. Proper technique shown and discussed handout in great detail with ATC.  All questions were discussed and answered.   2. Left hip pain 3. Muscle strain of gluteal region, left, subsequent encounter 4. Greater trochanteric bursitis of left hip -Chronic with exacerbation, subsequent visit - Overall improvement in left hip pain after intra-articular CSI on 03/14/2024 and greater trochanteric CSI on 06/10/2024.  Consistent with resolving bursitis, mild gluteus minimus tendinosis as seen on left hip MRI from 06/29/2023 - Continue HEP as tolerated    Pertinent previous records reviewed include lumbar MRI 06/30/2024   Follow Up: 8 weeks for reevaluation.  Could discuss OMT   Subjective:   I, Cass Edinger, am serving as a Neurosurgeon for Doctor Morene Mace    Chief Complaint: leg pain    HPI:    02/28/2023 Patient is a 38 year old female complaining of leg pain. Patient  states that she had left pain for years, she was dx with bursitis, she used to get CSI but the pain has changed, she had pain when sitting for long time, and then has pain when trying to walk after sitting , pain radiates to the back of the leg, sleep is hard she is a side sleeper, no numbness or tingling, tylenol  doesn't help with the pain, she states she used to hear hip pop but doesn't do that stretching anymore that would cause the popping, she is TTP in that area , she states she can feel a knot    03/26/2023 Patient states she had some pain while on vacation. Back pain has been more frequent when she is in a sitting position for a long period of time. Left hip has been killing her   04/17/2023 Patient states after CSI pain calmed down . Went swimming yesterday so she is a little flared    06/20/2023 Patient states past couple of weeks leg pain has flared. PT tomorrow    07/19/2023 Patient states she is doing okay MRI caused some pain for a couple of days     08/21/2023 Patient states she is getting better. Notices pain when she is walking or sitting for long periods of time   01/01/2024 Patient states right now it is ok. It has become really bothersome if she sits too long. Sleep is uncomfortable where she has to keep moving around to set comfortable.  Started to flare back up this past month but was doing well before then. Had some pain in the front almost on the bone  on the left side.    01/29/2024 Patient states she is doing a little better. Sat and Sunday she was flared was driving back from the beach. Started PT    03/13/2024 Patient states in a little pain. Been doing physical therapy but it doesn't seem like that it is helping. The front pain in the hip area has gotten worse especially when she tries to get up and walk. Would like to know if it is time for another injection.    06/10/2024 Patient states she has had increased amount of pain at night . Wider area of pain now     07/04/2024 Patient states she is okay   Relevant Historical Information: Hypertension, DM type I, CKD  Additional pertinent review of systems negative.   Current Outpatient Medications:    albuterol  (PROVENTIL ) (2.5 MG/3ML) 0.083% nebulizer solution, Take 3 mLs (2.5 mg total) by nebulization every 6 (six) hours as needed for wheezing or shortness of breath., Disp: 150 mL, Rfl: 1   albuterol  (VENTOLIN  HFA) 108 (90 Base) MCG/ACT inhaler, TAKE 2 PUFFS BY MOUTH EVERY 6 HOURS AS NEEDED FOR WHEEZE OR SHORTNESS OF BREATH, Disp: 18 each, Rfl: 2   aspirin  81 MG chewable tablet, Chew 1 tablet (81 mg total) by mouth at bedtime., Disp: , Rfl:    Budeson-Glycopyrrol-Formoterol  (BREZTRI AEROSPHERE) 160-9-4.8 MCG/ACT AERO, Inhale 2 puffs into the lungs 2 (two) times daily., Disp: , Rfl:    cloNIDine  (CATAPRES ) 0.1 MG tablet, Take 1 tablet (0.1 mg total) by mouth 2 (two) times daily as needed (anxiety)., Disp: 60 tablet, Rfl: 0   FASENRA 30 MG/ML SOSY, Inject into the skin every 3 (three) months., Disp: , Rfl:    Galcanezumab -gnlm (EMGALITY ) 120 MG/ML SOAJ, Inject 120 mg into the skin every 30 (thirty) days., Disp: 1.12 mL, Rfl: 11   GLUCAGON EMERGENCY 1 MG injection, Inject 1 mg into the skin as needed (hypoglycemia.)., Disp: , Rfl:    glucose blood (ACCU-CHEK GUIDE) test strip, Use to check blood sugar 3 time a day, Disp: 300 strip, Rfl: 4   insulin  glargine, 1 Unit Dial, (TOUJEO  SOLOSTAR) 300 UNIT/ML Solostar Pen, Inject 15 Units into the skin daily., Disp: 4.5 mL, Rfl: 0   insulin  lispro (HUMALOG ) 100 UNIT/ML injection, Inject up to 150 units daily according to insulin  pump settings., Disp: 10 mL, Rfl: 0   levonorgestrel  (MIRENA , 52 MG,) 20 MCG/24HR IUD, 1 each by Intrauterine route once., Disp: , Rfl:    losartan  (COZAAR ) 25 MG tablet, Take 12.5 mg by mouth daily. Pt takes 12.5 mg. 12/31/23., Disp: , Rfl:    lumateperone  tosylate 21 MG CAPS, Take 21 mg by mouth daily., Disp: 30 capsule, Rfl: 2    meloxicam  (MOBIC ) 15 MG tablet, Take 1 tablet (15 mg total) by mouth daily., Disp: 30 tablet, Rfl: 0   montelukast  (SINGULAIR ) 10 MG tablet, Take 1 tablet (10 mg total) by mouth at bedtime., Disp: 90 tablet, Rfl: 1   MOUNJARO  10 MG/0.5ML Pen, Inject 10 mg into the skin once a week., Disp: , Rfl:    pravastatin  (PRAVACHOL ) 20 MG tablet, Take 1 tablet (20 mg total) by mouth at bedtime., Disp: 90 tablet, Rfl: 3   sertraline  (ZOLOFT ) 50 MG tablet, Take 1 tablet (50 mg total) by mouth daily., Disp: 90 tablet, Rfl: 0   Ubrogepant  (UBRELVY ) 100 MG TABS, Take 1 tablet (100 mg total) by mouth daily as needed (migraine headaches.)., Disp: 30 tablet, Rfl: 3   valACYclovir  (VALTREX ) 1000 MG tablet,  2 tabs po bid x 1 prn breakout, Disp: 30 tablet, Rfl: 3   valACYclovir  (VALTREX ) 1000 MG tablet, Take 2 tablets (2,000 mg total) by mouth 2 (two) times daily., Disp: 4 tablet, Rfl: 3   Objective:     Vitals:   07/04/24 1033  Pulse: 93  SpO2: 99%  Weight: 156 lb (70.8 kg)  Height: 5' 6 (1.676 m)      Body mass index is 25.18 kg/m.    Physical Exam:    General: awake, alert, and oriented no acute distress, nontoxic Skin: no suspicious lesions or rashes Neuro:sensation intact distally with no deficits, normal muscle tone, no atrophy, strength 5/5 in all tested lower ext groups Psych: normal mood and affect, speech clear   Left hip/back: No deformity, swelling or wasting ROM Flexion 75, ext 20, IR 35, ER 30 TTP mildly hip flexors, greater trochanter, gluteal musculature, and moderately lumbar spine NTTP over the   si joint, Negative log roll with FROM   Positive FABER for posterior hip tension Negative FADIR   Negative Piriformis test Positive trendelenberg on left Gait normal Straight leg negative    Electronically signed by:  [name] 11:00 AM 07/04/24

## 2024-07-04 ENCOUNTER — Ambulatory Visit (INDEPENDENT_AMBULATORY_CARE_PROVIDER_SITE_OTHER): Admitting: Sports Medicine

## 2024-07-04 ENCOUNTER — Encounter: Payer: Self-pay | Admitting: Physical Therapy

## 2024-07-04 VITALS — HR 93 | Ht 66.0 in | Wt 156.0 lb

## 2024-07-04 DIAGNOSIS — S76012D Strain of muscle, fascia and tendon of left hip, subsequent encounter: Secondary | ICD-10-CM

## 2024-07-04 DIAGNOSIS — M25552 Pain in left hip: Secondary | ICD-10-CM | POA: Diagnosis not present

## 2024-07-04 DIAGNOSIS — M545 Low back pain, unspecified: Secondary | ICD-10-CM | POA: Diagnosis not present

## 2024-07-04 DIAGNOSIS — M7062 Trochanteric bursitis, left hip: Secondary | ICD-10-CM | POA: Diagnosis not present

## 2024-07-04 DIAGNOSIS — G8929 Other chronic pain: Secondary | ICD-10-CM | POA: Diagnosis not present

## 2024-07-04 NOTE — Patient Instructions (Signed)
 Pt referral   Low back HEP   Tylenol  [phone] mg 2-3 times a day for pain relief   8 week follow up

## 2024-07-11 ENCOUNTER — Ambulatory Visit: Admitting: Family Medicine

## 2024-07-11 ENCOUNTER — Encounter: Payer: Self-pay | Admitting: Family Medicine

## 2024-07-11 VITALS — BP 130/82 | HR 108 | Temp 98.6°F | Ht 66.0 in | Wt 148.0 lb

## 2024-07-11 DIAGNOSIS — J019 Acute sinusitis, unspecified: Secondary | ICD-10-CM | POA: Diagnosis not present

## 2024-07-11 DIAGNOSIS — J4541 Moderate persistent asthma with (acute) exacerbation: Secondary | ICD-10-CM | POA: Diagnosis not present

## 2024-07-11 DIAGNOSIS — J029 Acute pharyngitis, unspecified: Secondary | ICD-10-CM | POA: Diagnosis not present

## 2024-07-11 MED ORDER — BENZONATATE 100 MG PO CAPS: 100.0000 mg | ORAL_CAPSULE | Freq: Three times a day (TID) | ORAL | 0 refills | Status: DC | PRN

## 2024-07-11 MED ORDER — AMOXICILLIN-POT CLAVULANATE 875-125 MG PO TABS: 1.0000 | ORAL_TABLET | Freq: Two times a day (BID) | ORAL | 0 refills | Status: DC

## 2024-07-11 NOTE — Progress Notes (Signed)
 Patient Office Visit  Assessment & Plan:  Acute sinusitis, recurrence not specified, unspecified location -     Amoxicillin -Pot Clavulanate; Take 1 tablet by mouth 2 (two) times daily.  Dispense: 20 tablet; Refill: 0  Sore throat  Moderate persistent asthma with acute exacerbation -     Benzonatate ; Take 1 capsule (100 mg total) by mouth 3 (three) times daily as needed.  Dispense: 30 capsule; Refill: 0   Assessment and Plan    Acute sinusitis Acute sinusitis with thick green mucus, increased headaches, and drainage. No fever or chills. Differential diagnosis includes strep throat, but symptoms are not typical. No suspicion of pneumonia. - Prescribe Augmentin . - Advise use of Flonase  nasal spray. - Prescribe Tessalon  Perles for cough.  Allergic rhinitis Allergic rhinitis managed by over-the-counter medications and Singulair . - Continue Singulair . - Use Flonase  nasal spray.  Asthma Asthma well-controlled with Breztri and Fasenra. No recent flare-ups or increased wheezing. Concern about potential exacerbation due to sinusitis. - Continue Breztri. - Use albuterol  as needed, currently once a week.  Migraine Increased headaches likely related to sinusitis. History of migraines. Unable to use NSAIDs due to chronic kidney disease. - Continue using Tylenol  for headache relief.  Type 1 diabetes mellitus Type 1 diabetes mellitus with no specific issues discussed. No prednisone  prescribed due to diabetes management considerations.  Chronic kidney disease Chronic kidney disease limits the use of NSAIDs for headache management.          Return if symptoms worsen or fail to improve.   Subjective:    Patient ID: [name], female    DOB: 11-26-1985  Age: 38 y.o. MRN: [id number]  Chief Complaint  Patient presents with   Hoarse    Pt started with a sore throat and then started losing her voice on Wednesday.   Cough    Cough   Discussed the use of AI scribe  software for clinical note transcription with the patient, who gave verbal consent to proceed.  History of Present Illness        History of Present Illness [name] is a 38 year old female with asthma who presents with worsening hoarseness and throat discomfort, sinus pressure and coughing. Patient has history of asthma and sees pulmonary specialist at [facility]   She has been experiencing hoarseness and throat discomfort since Sunday night, with symptoms progressively worsening. There is no known exposure to irritants or infectious agents, although her children are in school. No fever or chills, but she notes some achiness and intermittent difficulty swallowing.  She has a history of being prone to strep throat but is unsure if this is the current issue. She has started experiencing thick, green mucus production, which she is coughing up and blowing out. She has a history of frequent sinus infections and is concerned about her asthma being affected. She uses Breztri and albuterol  for asthma management, with albuterol  used approximately once a week. She reports that since starting Fasenra a year and a half ago, she has not experienced asthma flares as she used to in the fall.  She reports increased headaches over the past few days, attributing them to her existing migraine condition. She has been taking over-the-counter cold and flu medications and Tylenol  for headache relief, as she cannot take NSAIDs due to kidney issues.  She has type 1 diabetes and is cautious about using prednisone , although she has some at home for emergencies. She is not currently using Flonase  but has it available at  home. She is allergic to cefdinir, which causes a rash, but can tolerate other cephalosporins and Augmentin . She denies any recent smoking or tobacco exposure and is currently disabled, not working.  Physical Exam HEENT: Fluid behind the ear. Throat with drainage. CHEST: Lungs clear to auscultation  bilaterally.  Assessment and Plan Acute sinusitis Acute sinusitis with thick green mucus, increased headaches, and drainage. No fever or chills. Differential diagnosis includes strep throat, but symptoms are not typical. No suspicion of pneumonia. - Prescribe Augmentin . - Advise use of Flonase  nasal spray. - Prescribe Tessalon  Perles for cough.  Allergic rhinitis Allergic rhinitis managed by over-the-counter medications and Singulair . - Continue Singulair . - Use Flonase  nasal spray.  Asthma Asthma well-controlled with Breztri and Fasenra. No recent flare-ups or increased wheezing. Concern about potential exacerbation due to sinusitis. - Continue Breztri. - Use albuterol  as needed, currently once a week.  Migraine Increased headaches likely related to sinusitis. History of migraines. Unable to use NSAIDs due to chronic kidney disease. - Continue using Tylenol  for headache relief.  Type 1 diabetes mellitus Type 1 diabetes mellitus with no specific issues discussed. No prednisone  prescribed due to diabetes management considerations.  Chronic kidney disease Chronic kidney disease limits the use of NSAIDs for headache management.    The ASCVD Risk score (Arnett DK, et al., 2019) failed to calculate for the following reasons:   The 2019 ASCVD risk score is only valid for ages 81 to 84  Past Medical History:  Diagnosis Date   Anemia    Anemia of chronic disease 06/07/2012   Formatting of this note might be different from the original. Hematology consultation 07-13-16 (see note)--> Anemia of chronic disease Required 3 transfusions during pregnancy  Last Assessment & Plan:  Formatting of this note might be different from the original. She has now required 3 blood transfusions this pregnancy for her anemia of chronic disease. The most recent was one week ago when she bec   Anxiety    Asthma    Asthma 11/10/2013   Back pain    Bipolar 1 disorder (HCC)    Chest pain    Chronic renal  failure syndrome, stage 3 (moderate) (HCC)    Colles' fracture of left radius 10/11/2013   Complication of anesthesia    Constipation    Depression    Depression    Diabetic gastroparesis (HCC)    Diabetic neuropathy, type I diabetes mellitus (HCC)    Diabetic ulcer of right great toe (HCC) 08/27/2020   Edema, lower extremity    Gallbladder disease    Gastroparesis    GERD (gastroesophageal reflux disease)    Headache(784.0)    Hypertension    Joint pain    Kidney stones    Palpitations    Polyneuropathy in diabetes(357.2)    PONV (postoperative nausea and vomiting)    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)    S/P carpal tunnel release left 10/16/19 11/04/2019   Shortness of breath    Tachycardia    baseline tachycardia    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)    Past Surgical History:  Procedure Laterality Date   ANAL RECTAL MANOMETRY N/A 03/25/2018   Procedure: ANO RECTAL MANOMETRY;  Surgeon: Legrand Victory LITTIE DOUGLAS, MD;  Location: [facility];  Service: Gastroenterology;  Laterality: N/A;   CARPAL TUNNEL RELEASE Left 10/16/2019   Procedure: LEFT CARPAL TUNNEL RELEASE;  Surgeon: Margrette Taft BRAVO, MD;  Location: [facility];  Service: Orthopedics;  Laterality:  Left;   CESAREAN SECTION     x 2   CHOLECYSTECTOMY     EYE SURGERY     REFRACTIVE SURGERY Bilateral    Social History   Tobacco Use   Smoking status: Never   Smokeless tobacco: Never  Vaping Use   Vaping status: Never Used  Substance Use Topics   Alcohol use: No   Drug use: No   Family History  Problem Relation Age of Onset   Diabetes Mother        type 2   Hypertension Mother    Obesity Mother    Diabetes Father    Hypertension Father    High Cholesterol Father    Sleep apnea Father    Obesity Father    Heart disease Father    Heart attack Father    Diabetes Brother        type 1   Renal Disease Brother        renal failure   Hypertension Brother    Hypertension  Maternal Grandmother    Breast cancer Maternal Aunt 64   Colon cancer Maternal Aunt    Uterine cancer Maternal Aunt 70   Breast cancer Other    Rectal cancer Neg Hx    Esophageal cancer Neg Hx    Allergies  Allergen Reactions   Nsaids Other (See Comments)    Due to Renal disease   Other    Toradol  [Ketorolac  Tromethamine ]     Cannot take d/t kidney issues    Ceftin Rash    Review of Systems  Respiratory:  Positive for cough.       Objective:    BP 130/82   Pulse (!) 108   Temp 98.6 F (37 C)   Ht 5' 6 (1.676 m)   Wt 148 lb (67.1 kg)   SpO2 98%   BMI 23.89 kg/m  BP Readings from Last 3 Encounters:  07/11/24 130/82  03/14/24 (!) 142/80  01/29/24 132/82   Wt Readings from Last 3 Encounters:  07/11/24 148 lb (67.1 kg)  07/04/24 156 lb (70.8 kg)  06/10/24 157 lb (71.2 kg)    Physical Exam Vitals and nursing note reviewed.  Constitutional:      General: She is not in acute distress.    Appearance: Normal appearance.     Comments: Has hoarse voice  HENT:     Head: Normocephalic.     Right Ear: Tympanic membrane, ear canal and external ear normal.     Left Ear: Tympanic membrane, ear canal and external ear normal.     Nose: Congestion present.     Mouth/Throat:     Pharynx: No oropharyngeal exudate or posterior oropharyngeal erythema.     Comments: Has postnasal drip Eyes:     Extraocular Movements: Extraocular movements intact.     Pupils: Pupils are equal, round, and reactive to light.  Cardiovascular:     Rate and Rhythm: Regular rhythm. Tachycardia present.     Heart sounds: Normal heart sounds.  Pulmonary:     Effort: Pulmonary effort is normal.     Breath sounds: Normal breath sounds. No wheezing or rhonchi.  Musculoskeletal:     Right lower leg: No edema.     Left lower leg: No edema.  Neurological:     General: No focal deficit present.     Mental Status: She is alert and oriented to person, place, and time.  Psychiatric:        Mood and  Affect:  Mood normal.        Behavior: Behavior normal.      No results found for any visits on 07/11/24.

## 2024-07-18 ENCOUNTER — Other Ambulatory Visit: Payer: Self-pay

## 2024-07-18 NOTE — Telephone Encounter (Signed)
 Prescription Request  07/18/2024  LOV: 12/31/23  What is the name of the medication or equipment? Ubrogepant  (UBRELVY ) 100 MG TABS [[id number]]   Have you contacted your pharmacy to request a refill? Yes   Which pharmacy would you like this sent to?  [facility] [address] [place] - [address] [place] [address] Phone: [phone] Fax: [phone]    Patient notified that their request is being sent to the clinical staff for review and that they should receive a response within 2 business days.   Please advise at [facility] [phone]

## 2024-07-21 ENCOUNTER — Other Ambulatory Visit (HOSPITAL_COMMUNITY): Payer: Self-pay

## 2024-07-21 ENCOUNTER — Telehealth: Payer: Self-pay | Admitting: Pharmacy Technician

## 2024-07-21 NOTE — Therapy (Signed)
 OUTPATIENT PHYSICAL THERAPY THORACOLUMBAR EVALUATION   Patient Name: [name] MRN: [id number] DOB:July 06, 1986, 38 y.o., female Today's Date: 07/22/2024  END OF SESSION:  PT End of Session - 07/22/24 1404     Visit Number 1    Date for Recertification  09/19/24    Authorization Type Medicaid/ Medicare    Progress Note Due on Visit 10    PT Start Time [phone]    PT Stop Time 0930    PT Time Calculation (min) 44 min    Activity Tolerance Patient tolerated treatment well    Behavior During Therapy [facility] for tasks assessed/performed          Past Medical History:  Diagnosis Date   Anemia    Anemia of chronic disease 06/07/2012   Formatting of this note might be different from the original. Hematology consultation 07-13-16 (see note)--> Anemia of chronic disease Required 3 transfusions during pregnancy  Last Assessment & Plan:  Formatting of this note might be different from the original. She has now required 3 blood transfusions this pregnancy for her anemia of chronic disease. The most recent was one week ago when she bec   Anxiety    Asthma    Asthma 11/10/2013   Back pain    Bipolar 1 disorder (HCC)    Chest pain    Chronic renal failure syndrome, stage 3 (moderate) (HCC)    Colles' fracture of left radius 10/11/2013   Complication of anesthesia    Constipation    Depression    Depression    Diabetic gastroparesis (HCC)    Diabetic neuropathy, type I diabetes mellitus (HCC)    Diabetic ulcer of right great toe (HCC) 08/27/2020   Edema, lower extremity    Gallbladder disease    Gastroparesis    GERD (gastroesophageal reflux disease)    Headache(784.0)    Hypertension    Joint pain    Kidney stones    Palpitations    Polyneuropathy in diabetes(357.2)    PONV (postoperative nausea and vomiting)    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)    S/P carpal tunnel release left 10/16/19 11/04/2019   Shortness of breath    Tachycardia    baseline tachycardia    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)    Past Surgical History:  Procedure Laterality Date   ANAL RECTAL MANOMETRY N/A 03/25/2018   Procedure: ANO RECTAL MANOMETRY;  Surgeon: Legrand Victory LITTIE DOUGLAS, MD;  Location: [facility];  Service: Gastroenterology;  Laterality: N/A;   CARPAL TUNNEL RELEASE Left 10/16/2019   Procedure: LEFT CARPAL TUNNEL RELEASE;  Surgeon: Margrette Taft BRAVO, MD;  Location: [facility];  Service: Orthopedics;  Laterality: Left;   CESAREAN SECTION     x 2   CHOLECYSTECTOMY     EYE SURGERY     REFRACTIVE SURGERY Bilateral    Patient Active Problem List   Diagnosis Date Noted   Bipolar disorder, current episode depressed, severe, without psychotic features (HCC) 07/10/2023   Major depressive disorder, recurrent severe without psychotic features (HCC) 07/09/2023   Chronic left hip pain 07/03/2023   Overactive bladder 02/07/2022   Recurrent cold sores 02/02/2021   Iron deficiency anemia 12/19/2020   Long term (current) use of insulin  (HCC) 12/19/2020   Migraine 11/15/2020   CKD stage 3 secondary to diabetes (HCC) 11/15/2020   Diabetic neuropathy (HCC) 11/15/2020   Type 1 diabetes mellitus with stage 3b chronic kidney disease (HCC) 08/27/2020   Polyphagia  08/27/2020   Bipolar 1 disorder, mixed, severe (HCC) 08/27/2020   Type 1 diabetes mellitus with hyperglycemia (HCC) 08/27/2020   Financial difficulties 08/27/2020   Drug-induced weight gain 08/27/2020   Traction detachment of right retina 08/27/2020   Vitreous hemorrhage of left eye (HCC) 08/27/2020   Hypertension associated with diabetes (HCC) 08/27/2020   Type 1 diabetes mellitus with diabetic polyneuropathy 08/27/2020   Class 1 obesity with serious comorbidity and body mass index (BMI) of 33.0 to 33.9 in adult 08/27/2020   Status post eye surgery 07/01/2020   Chronic migraine without aura 01/22/2020   S/P eye surgery 08/29/2019   Diplopia 07/23/2019   Epiretinal membrane (ERM) of  left eye 07/23/2019   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/2019   Pseudophakia of left eye 11/04/2018   Chronic kidney disease, stage 3b (HCC) 01/01/2017   Diabetic nephropathy associated with type 1 diabetes mellitus (HCC) 03/01/2016   After-cataract obscuring vision, left 09/22/2014   Drug overdose, intentional (HCC) 03/27/2014   Asthma 11/10/2013   MDD (major depressive disorder), recurrent episode, severe (HCC) 09/26/2013   Generalized anxiety disorder 09/26/2013   Aphakia, right eye 09/14/2012   Retinal detachment, tractional, left eye 06/11/2012   Eczema 06/07/2012   Diabetic retinopathy associated with type 1 diabetes mellitus (HCC) 04/30/2012   H/O insertion of insulin  pump 04/30/2012   History of atrial tachycardia 08/08/2010   Gastroparesis due to DM (HCC) 08/08/2010    PCP:    Duanne Butler DASEN, MD    REFERRING PROVIDER: Leonce Katz, DO  REFERRING DIAG:  Diagnosis  M54.50,G89.29 (ICD-10-CM) - Chronic bilateral low back pain without sciatica    Rationale for Evaluation and Treatment: Rehabilitation  THERAPY DIAG:  Other low back pain  Cramp and spasm  Pain of both hip joints  Muscle weakness (generalized)  ONSET DATE: 3-4 months ago  SUBJECTIVE:                                                                                                                                                                                           SUBJECTIVE STATEMENT: Patient presents with increased back pain that began 3-4 months ago. Her pain is aggravated with bending and lifting activities. She is currently doing home renovations and her back hurts worse when she does a lot of activity. Last weekend she repaired a deck. Denies numbness and tingling down legs. She also has bilateral hip pain that she has been treated in the clinic before for Lt> Rt.   PERTINENT HISTORY:  Anemia; Anxiety; Depression; type 1 diabetes; HTN;    PAIN:  Are you  having pain? Yes: NPRS scale: 3(worst) 12(worst)/10 Pain location: mid to lower back Rt > Lt  Pain description: sharp Aggravating factors: bending, lifting, standing still ; walking>1.5 miles  Relieving factors: Heat  PRECAUTIONS: None  RED FLAGS: None   WEIGHT BEARING RESTRICTIONS: No  FALLS:  Has patient fallen in last 6 months? No  LIVING ENVIRONMENT: Lives with: lives with their family Lives in: House/apartment Stairs: Yes: Internal: 2,12 steps; can reach both  OCCUPATION: Not currently working  PLOF: Independent, Independent with basic ADLs, Independent with household mobility without device, Independent with community mobility without device, Independent with gait, and Independent with transfers  PATIENT GOALS: A better way to deal with pain  NEXT MD VISIT: 08/29/2024  OBJECTIVE:  Note: Objective measures were completed at Evaluation unless otherwise noted.  DIAGNOSTIC FINDINGS:  Lumbar MRI 9/22: IMPRESSION: Mild disc desiccation and mild facet arthrosis. No significant foraminal or spinal stenosis.  Lumbar X ray 9/10 FINDINGS: There is no evidence of lumbar spine fracture. Alignment is normal. Intervertebral disc spaces are maintained.  PATIENT SURVEYS:  Modified Oswestry: 9/50 18%   Minimally Clinically Important Difference (MCID) = 12.8%  COGNITION: Overall cognitive status: Within functional limits for tasks assessed     SENSATION: WFL  MUSCLE LENGTH: Hamstrings: adequate muscle length    POSTURE: rounded shoulders and forward head  PALPATION: Tenderness lower Tspine into lumbar spine Tenderness QL bilateral  Pain with Grade I joint mobs lower Tspine into lumbar spine  LUMBAR ROM: *pain  AROM eval  Flexion Bends to toes  Extension 50% limited*  Right lateral flexion Bends to above knee joint  Left lateral flexion Bend to above knee joint  Right rotation 15% limited *  Left rotation 100%   (Blank rows = not  tested)  LOWER EXTREMITY ROM:   WFL bilateral   LOWER EXTREMITY MMT:  Grossly 4 to 4+/5    FUNCTIONAL TESTS:  5 times sit to stand: 9.62 sec no hands (left hip pain) Timed up and go (TUG): 7.32  GAIT: Comments: Unremarkable   TREATMENT DATE: 07/22/2024 Initial Evaluation & HEP created                                                                                                                               Education on Examination findings and what we will focus on with PT   PATIENT EDUCATION:  Education details: Provided patient education and discussion on quality of life and stress management. Discussed motivators, challenges, and goals for future.  Person educated: Patient Education method: Explanation, Demonstration, and Handouts Education comprehension: verbalized understanding, returned demonstration, and needs further education  HOME EXERCISE PROGRAM: Access Code: [email or web address] URL: [email or web address] Date: 07/22/2024 Prepared by: Kristeen Sar  Exercises - Supine Lower Trunk Rotation  - 1 x daily - 7 x weekly - 1 sets - 10 reps - 5 hold - Sidelying Thoracic Rotation with Open Book  - 1 x daily - 7 x weekly -  1 sets - 10 reps - Standing Quadratus Lumborum Stretch with Doorway  - 1 x daily - 7 x weekly - 2-3 sets - 20-30s hold - Child's Pose Stretch  - 1 x daily - 7 x weekly - 2 sets - 20-30s hold - Child's Pose with Sidebending  - 1 x daily - 7 x weekly - 2 sets - 20-30s hold  ASSESSMENT:  CLINICAL IMPRESSION: Patient is a 38 y.o. female who was seen today for physical therapy evaluation and treatment for low back. Patient presents with back pain that began 2-3 months ago. Hailyn has been doing home renovations over the past few months and that is when she started to notice increased pain. She is known in this clinic from previous POCs. Her back pain is aggravated with bending and lifting activities. Based on evaluation noted decreased ROM, increased  muscle spasms, and pain with joint mobility. Educated patient on her diagnostic imaging findings and what we will focus on with physical therapy. Patient is highly motivated and wants to manage the pain. Patient will benefit from skilled PT to address the below impairments and improve overall function.   OBJECTIVE IMPAIRMENTS: decreased mobility, difficulty walking, decreased ROM, decreased strength, hypomobility, increased muscle spasms, impaired flexibility, postural dysfunction, and pain.   ACTIVITY LIMITATIONS: carrying, lifting, bending, sitting, standing, squatting, sleeping, stairs, and locomotion level  PARTICIPATION LIMITATIONS: meal prep, cleaning, laundry, and driving  PERSONAL FACTORS: Fitness, Time since onset of injury/illness/exacerbation, and 3+ comorbidities: Anxiety; Depression; type 1 diabetes; HTN; are also affecting patient's functional outcome.   REHAB POTENTIAL: Good  CLINICAL DECISION MAKING: Evolving/moderate complexity  EVALUATION COMPLEXITY: Moderate   GOALS: Goals reviewed with patient? Yes  SHORT TERM GOALS: Target date: 08/19/2024  Patient will be independent with initial HEP. Baseline:  Goal status: INITIAL  2.  Patient will report > or = to 30% improvement in back pain since starting PT. Baseline:  Goal status: INITIAL   3.  Patient will demonstrate appropriate TA activation with no cues. Baseline:  Goal status: INITIAL   LONG TERM GOALS: Target date: 09/19/2024  Patient will demonstrate independence in advanced HEP. Baseline:  Goal status: INITIAL  2.  Patient will report > or = to 70% improvement in back pain since starting PT. Baseline:  Goal status: INITIAL  3.  Patient will verbalize and demonstrate self-care strategies to manage pain including tissue mobility practices and change of position. Baseline:  Goal status: INITIAL   4.  Patient will demonstrate correct lifting and bending technique to prevent mechanical strain and  further injury. Baseline:  Goal status: INITIAL  5.  Patient will be able to walk at least 2 miles without any pain or discomfort from her back for improved community negotiation of trips. Baseline:  Goal status: INITIAL   PLAN:  PT FREQUENCY: 1-2x/week  PT DURATION: 8 weeks  PLANNED INTERVENTIONS: 97164- PT Re-evaluation, 97110-Therapeutic exercises, 97530- Therapeutic activity, 97112- Neuromuscular re-education, 97535- Self Care, 02859- Manual therapy, [phone]- Canalith repositioning, [id number]- Aquatic Therapy, H9716- Electrical stimulation (unattended), [phone]- Electrical stimulation (manual), [id number]- Vasopneumatic device, [id number]- Ultrasound, [id number]- Traction (mechanical), [id number]- Ionotophoresis 4mg /ml Dexamethasone, 79439 (1-2 muscles), 20561 (3+ muscles)- Dry Needling, Patient/Family education, Balance training, Stair training, Taping, Joint mobilization, Joint manipulation, Spinal manipulation, Spinal mobilization, Vestibular training, Cryotherapy, and Moist heat.  PLAN FOR NEXT SESSION: Review HEP, lumbar & hip flexibility, TA activation, hinging technique   Kristeen Sar, PT 07/22/24 2:04 PM [facility] [address] [place], [place] [address] Phone #  [phone] Fax [phone]

## 2024-07-21 NOTE — Telephone Encounter (Signed)
 Requested medications are due for refill today.  yes  Requested medications are on the active medications list.  yes  Last refill. 12/31/2023 #30 3 rf  Future visit scheduled.   Not at [facility]  Notes to clinic.  Refill not delegated    Requested Prescriptions  Pending Prescriptions Disp Refills   Ubrogepant  (UBRELVY ) 100 MG TABS 30 tablet 3    Sig: Take 1 tablet (100 mg total) by mouth daily as needed (migraine headaches.).     Off-Protocol Failed - 07/21/2024  5:44 PM      Failed - Medication not assigned to a protocol, review manually.      Passed - Valid encounter within last 12 months    Recent Outpatient Visits           1 week ago Acute sinusitis, recurrence not specified, unspecified location   [facility] [address] [facility] Aletha Bene, MD   6 months ago Chronic migraine w/o aura, not intractable, w/o stat migr   [place] [facility] Duanne Butler DASEN, MD   1 year ago Chronic left hip pain   [facility], Debby PARAS, MD   1 year ago Chest pain, unspecified type   [place] [facility] Duanne Butler DASEN, MD       Future Appointments             In 1 month Cristina Katz, DO [facility]

## 2024-07-21 NOTE — Progress Notes (Unsigned)
   No chief complaint on file.  Mirena  REplaced 12/05/19 after previous one expelled. Was amenorrheic till now; now with spotting for a few days for past 2 cycles. No BTB, no dysmen. She is sexually active, no pain/bleeding, no new partners   History of Present Illness:  [name] is a 38 y.o. that had a Mirena  IUD placed 12/05/19. Since that time, she denies dyspareunia, pelvic pain, non-menstrual bleeding, vaginal d/c, heavy bleeding.   Pap 11/21/22 was ASCUS/POS HPV DNA; repeat due in 1 yr. Hx of abn pap at [facility] in 2018.   There were no vitals taken for this visit.  Pelvic exam:  Two IUD strings {DESC; PRESENT/ABSENT:17923} seen coming from the cervical os. EGBUS, vaginal vault and cervix: within normal limits  IUD Removal Strings of IUD identified and grasped.  IUD removed without problem with ring forceps.  Pt tolerated this well.  IUD noted to be intact.  IUD Insertion Procedure Note Patient identified, informed consent performed, consent signed.   Discussed risks of irregular bleeding, cramping, infection, malpositioning or misplacement of the IUD outside the uterus which may require further procedure such as laparoscopy, risk of failure <1%. Time out was performed.  Urine pregnancy test negative.***  Speculum placed in the vagina.  Cervix visualized.  Cleaned with Betadine x 2.  Grasped anteriorly with a single tooth tenaculum.  Uterus sounded to *** cm.   IUD placed per manufacturer's recommendations.  Strings trimmed to 3 cm. Tenaculum was removed, good hemostasis noted.  Patient tolerated procedure well.   ASSESSMENT:  No diagnosis found.   No orders of the defined types were placed in this encounter.    Plan:  Patient was given post-procedure instructions.  She was advised to have backup contraception for one week.   Call if you are having increasing pain, cramps or bleeding or if you have a fever greater than 100.4 degrees F., shaking chills, nausea or  vomiting. Patient was also asked to check IUD strings periodically and follow up in 4 weeks for IUD check.  No follow-ups on file.  Seona Clemenson B. Kamisha Ell, PA-C 07/21/2024 4:33 PM

## 2024-07-21 NOTE — Telephone Encounter (Signed)
 Pharmacy Patient Advocate Encounter   Received notification from Onbase that prior authorization for Ubrelvy  100MG  tablets is required/requested.   Insurance verification completed.   The patient is insured through [facility].   Per test claim: PA required; PA submitted to above mentioned insurance via Latent Key/confirmation #/EOC [id number] Status is pending

## 2024-07-21 NOTE — Telephone Encounter (Signed)
 Pharmacy Patient Advocate Encounter  Received notification from [facility] MEDICARE that Prior Authorization for Ubrelvy  100MG  tablets has been APPROVED from 07/21/24 to 07/21/25. Ran test claim, Copay is $0.00. This test claim was processed through [facility]- copay amounts may vary at other pharmacies due to pharmacy/plan contracts, or as the patient moves through the different stages of their insurance plan.   PA #/Case ID/Reference #: [id number]

## 2024-07-22 ENCOUNTER — Other Ambulatory Visit: Payer: Self-pay

## 2024-07-22 ENCOUNTER — Ambulatory Visit: Admitting: Obstetrics and Gynecology

## 2024-07-22 ENCOUNTER — Other Ambulatory Visit (HOSPITAL_COMMUNITY)
Admission: RE | Admit: 2024-07-22 | Discharge: 2024-07-22 | Disposition: A | Source: Ambulatory Visit | Attending: Obstetrics and Gynecology | Admitting: Obstetrics and Gynecology

## 2024-07-22 ENCOUNTER — Ambulatory Visit: Attending: Sports Medicine | Admitting: Physical Therapy

## 2024-07-22 ENCOUNTER — Encounter: Payer: Self-pay | Admitting: Physical Therapy

## 2024-07-22 ENCOUNTER — Encounter: Payer: Self-pay | Admitting: Obstetrics and Gynecology

## 2024-07-22 VITALS — BP 125/85 | HR 98 | Ht 66.0 in | Wt 149.0 lb

## 2024-07-22 DIAGNOSIS — M5459 Other low back pain: Secondary | ICD-10-CM | POA: Insufficient documentation

## 2024-07-22 DIAGNOSIS — M545 Low back pain, unspecified: Secondary | ICD-10-CM | POA: Diagnosis not present

## 2024-07-22 DIAGNOSIS — R87810 Cervical high risk human papillomavirus (HPV) DNA test positive: Secondary | ICD-10-CM | POA: Diagnosis not present

## 2024-07-22 DIAGNOSIS — G8929 Other chronic pain: Secondary | ICD-10-CM | POA: Diagnosis not present

## 2024-07-22 DIAGNOSIS — R87610 Atypical squamous cells of undetermined significance on cytologic smear of cervix (ASC-US): Secondary | ICD-10-CM | POA: Insufficient documentation

## 2024-07-22 DIAGNOSIS — M6281 Muscle weakness (generalized): Secondary | ICD-10-CM | POA: Insufficient documentation

## 2024-07-22 DIAGNOSIS — M25551 Pain in right hip: Secondary | ICD-10-CM | POA: Insufficient documentation

## 2024-07-22 DIAGNOSIS — Z1151 Encounter for screening for human papillomavirus (HPV): Secondary | ICD-10-CM | POA: Insufficient documentation

## 2024-07-22 DIAGNOSIS — Z124 Encounter for screening for malignant neoplasm of cervix: Secondary | ICD-10-CM | POA: Insufficient documentation

## 2024-07-22 DIAGNOSIS — Z30431 Encounter for routine checking of intrauterine contraceptive device: Secondary | ICD-10-CM

## 2024-07-22 DIAGNOSIS — M25552 Pain in left hip: Secondary | ICD-10-CM | POA: Insufficient documentation

## 2024-07-22 DIAGNOSIS — Z01419 Encounter for gynecological examination (general) (routine) without abnormal findings: Secondary | ICD-10-CM | POA: Diagnosis present

## 2024-07-22 DIAGNOSIS — R252 Cramp and spasm: Secondary | ICD-10-CM | POA: Diagnosis present

## 2024-07-22 DIAGNOSIS — Z30433 Encounter for removal and reinsertion of intrauterine contraceptive device: Secondary | ICD-10-CM

## 2024-07-22 MED ORDER — UBRELVY 100 MG PO TABS: 100.0000 mg | ORAL_TABLET | Freq: Every day | ORAL | 3 refills | Status: AC | PRN

## 2024-07-22 NOTE — Patient Instructions (Addendum)
 I value your feedback and you entrusting [name] with your care. If you get a [place] patient survey, I would appreciate you taking the time to let us know about your experience today. Thank you! ? ? ?

## 2024-07-25 LAB — CYTOLOGY - PAP
Comment: NEGATIVE
Diagnosis: NEGATIVE
High risk HPV: NEGATIVE

## 2024-07-26 ENCOUNTER — Ambulatory Visit: Payer: Self-pay | Admitting: Obstetrics and Gynecology

## 2024-07-30 ENCOUNTER — Ambulatory Visit: Admitting: Physical Therapy

## 2024-07-30 ENCOUNTER — Ambulatory Visit: Admitting: Professional Counselor

## 2024-07-30 ENCOUNTER — Encounter: Payer: Self-pay | Admitting: Professional Counselor

## 2024-07-30 DIAGNOSIS — F319 Bipolar disorder, unspecified: Secondary | ICD-10-CM

## 2024-07-30 NOTE — Progress Notes (Signed)
      [facility] Counselor/Therapist Progress Note  Patient ID: [name], MRN: [id number],    Date: 07/30/2024  Time Spent: 11:17 AM to 11:48 AM  Treatment Type: Individual Therapy  Reported Symptoms: Stress, interpersonal concerns, worries, grief/loss, intermittent low mood, phase of life concerns, life transition concerns  Mental Status Exam:  Appearance:   Neat     Behavior:  Appropriate, Sharing, and Motivated  Motor:  Normal  Speech/Language:   Clear and Coherent and Normal Rate  Affect:  Appropriate and Congruent  Mood:  normal  Thought process:  normal  Thought content:    WNL  Sensory/Perceptual disturbances:    WNL  Orientation:  oriented to person, place, time/date, and situation  Attention:  Good  Concentration:  Good  Memory:  WNL  Fund of knowledge:   Good  Insight:    Good  Judgment:   Good  Impulse Control:  Good   Risk Assessment: Danger to Self:  No Self-injurious Behavior: No Danger to Others: No Duty to Warn:no Physical Aggression / Violence:No  Access to Firearms a concern: No  Gang Involvement:No   Subjective: Patient presented to session to address concerns of bipolar disorder.  She reported makes progress at this time.  She processed experience of grief and loss per relationship while also feeling in the place of healing and moving on.  She processed experience of feeling that she has gotten her joy back, and feels more like herself, and for things with her children and primary relationship to be going well.  She identified having reconnected with friends recently.  Counselor actively listened, affirmed patient feelings, experience, and progress, and reinforced interpersonal effectiveness skills including open communication with others where indicated.  Counselor provided psychoeducation and bibliotherapy regarding conflict resolution including Cecillia title, dignity index resource and coping skills booklet.  Interventions:  Solution-Oriented/Positive Psychology, Humanistic/Existential, Psycho-education/Bibliotherapy, and Insight-Oriented  Diagnosis:   ICD-10-CM   1. Bipolar I disorder (HCC)  F31.9       Plan: Patient is scheduled for follow-up; continue process work and developing coping skills.  Patient short-term goal between sessions to resource coping skills discussed in session, and and continue to engage in opportunities for positive social outlets.  [name], [facility]

## 2024-08-08 ENCOUNTER — Ambulatory Visit: Admitting: Physical Therapy

## 2024-08-13 ENCOUNTER — Ambulatory Visit: Attending: Sports Medicine | Admitting: Physical Therapy

## 2024-08-13 ENCOUNTER — Encounter: Payer: Self-pay | Admitting: Physical Therapy

## 2024-08-13 DIAGNOSIS — M25551 Pain in right hip: Secondary | ICD-10-CM | POA: Insufficient documentation

## 2024-08-13 DIAGNOSIS — M25552 Pain in left hip: Secondary | ICD-10-CM | POA: Insufficient documentation

## 2024-08-13 DIAGNOSIS — R252 Cramp and spasm: Secondary | ICD-10-CM | POA: Insufficient documentation

## 2024-08-13 DIAGNOSIS — M5459 Other low back pain: Secondary | ICD-10-CM | POA: Diagnosis present

## 2024-08-13 DIAGNOSIS — M6281 Muscle weakness (generalized): Secondary | ICD-10-CM | POA: Insufficient documentation

## 2024-08-13 NOTE — Therapy (Signed)
 OUTPATIENT PHYSICAL THERAPY THORACOLUMBAR TREATMENT   Patient Name: [name] MRN: [id number] DOB:[birth year], 38 y.o., female Today's Date: 08/13/2024  END OF SESSION:  PT End of Session - 08/13/24 1125     Visit Number 2    Date for Recertification  09/19/24    Authorization Type Medicaid/ Medicare    Progress Note Due on Visit 10    PT Start Time 0934    PT Stop Time 1015    PT Time Calculation (min) 41 min    Activity Tolerance Patient tolerated treatment well    Behavior During Therapy [facility] for tasks assessed/performed           Past Medical History:  Diagnosis Date   Anemia    Anemia of chronic disease 06/07/2012   Formatting of this note might be different from the original. Hematology consultation 07-13-16 (see note)--> Anemia of chronic disease Required 3 transfusions during pregnancy  Last Assessment & Plan:  Formatting of this note might be different from the original. She has now required 3 blood transfusions this pregnancy for her anemia of chronic disease. The most recent was one week ago when she bec   Anxiety    Asthma    Asthma 11/10/2013   Back pain    Bipolar 1 disorder (HCC)    Chest pain    Chronic renal failure syndrome, stage 3 (moderate) (HCC)    Colles' fracture of left radius 10/11/2013   Complication of anesthesia    Constipation    Depression    Depression    Diabetic gastroparesis (HCC)    Diabetic neuropathy, type I diabetes mellitus (HCC)    Diabetic ulcer of right great toe (HCC) 08/27/2020   Edema, lower extremity    Gallbladder disease    Gastroparesis    GERD (gastroesophageal reflux disease)    Headache(784.0)    Hypertension    Joint pain    Kidney stones    Palpitations    Polyneuropathy in diabetes(357.2)    PONV (postoperative nausea and vomiting)    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)    S/P carpal tunnel release left 10/16/19 11/04/2019   Shortness of breath    Tachycardia    baseline tachycardia    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)    Past Surgical History:  Procedure Laterality Date   ANAL RECTAL MANOMETRY N/A 03/25/2018   Procedure: ANO RECTAL MANOMETRY;  Surgeon: Legrand Victory LITTIE DOUGLAS, MD;  Location: [facility];  Service: Gastroenterology;  Laterality: N/A;   CARPAL TUNNEL RELEASE Left 10/16/2019   Procedure: LEFT CARPAL TUNNEL RELEASE;  Surgeon: Margrette Taft BRAVO, MD;  Location: [facility];  Service: Orthopedics;  Laterality: Left;   CESAREAN SECTION     x 2   CHOLECYSTECTOMY     EYE SURGERY     REFRACTIVE SURGERY Bilateral    Patient Active Problem List   Diagnosis Date Noted   Bipolar disorder, current episode depressed, severe, without psychotic features (HCC) 07/10/2023   Major depressive disorder, recurrent severe without psychotic features (HCC) 07/09/2023   Chronic left hip pain 07/03/2023   Overactive bladder 02/07/2022   Recurrent cold sores 02/02/2021   Iron deficiency anemia 12/19/2020   Long term (current) use of insulin  (HCC) 12/19/2020   Migraine 11/15/2020   CKD stage 3 secondary to diabetes (HCC) 11/15/2020   Diabetic neuropathy (HCC) 11/15/2020   Type 1 diabetes mellitus with stage 3b chronic kidney disease (HCC) 08/27/2020  Polyphagia 08/27/2020   Bipolar 1 disorder, mixed, severe (HCC) 08/27/2020   Type 1 diabetes mellitus with hyperglycemia (HCC) 08/27/2020   Financial difficulties 08/27/2020   Drug-induced weight gain 08/27/2020   Traction detachment of right retina 08/27/2020   Vitreous hemorrhage of left eye (HCC) 08/27/2020   Hypertension associated with diabetes (HCC) 08/27/2020   Type 1 diabetes mellitus with diabetic polyneuropathy 08/27/2020   Class 1 obesity with serious comorbidity and body mass index (BMI) of 33.0 to 33.9 in adult 08/27/2020   Status post eye surgery 07/01/2020   Chronic migraine without aura 01/22/2020   S/P eye surgery 08/29/2019   Diplopia 07/23/2019   Epiretinal membrane (ERM) of  left eye 07/23/2019   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/2019   Pseudophakia of left eye 11/04/2018   Chronic kidney disease, stage 3b (HCC) 01/01/2017   Diabetic nephropathy associated with type 1 diabetes mellitus (HCC) 03/01/2016   After-cataract obscuring vision, left 09/22/2014   Drug overdose, intentional (HCC) 03/27/2014   Asthma 11/10/2013   MDD (major depressive disorder), recurrent episode, severe (HCC) 09/26/2013   Generalized anxiety disorder 09/26/2013   Aphakia, right eye 09/14/2012   Retinal detachment, tractional, left eye 06/11/2012   Eczema 06/07/2012   Diabetic retinopathy associated with type 1 diabetes mellitus (HCC) 04/30/2012   H/O insertion of insulin  pump 04/30/2012   History of atrial tachycardia 08/08/2010   Gastroparesis due to DM (HCC) 08/08/2010    PCP:    Duanne Butler DASEN, MD    REFERRING PROVIDER: Leonce Katz, DO  REFERRING DIAG:  Diagnosis  M54.50,G89.29 (ICD-10-CM) - Chronic bilateral low back pain without sciatica    Rationale for Evaluation and Treatment: Rehabilitation  THERAPY DIAG:  Other low back pain  Cramp and spasm  Pain of both hip joints  Muscle weakness (generalized)  ONSET DATE: 3-4 months ago  SUBJECTIVE:                                                                                                                                                                                           SUBJECTIVE STATEMENT: Patient reports she is good today. No pain currently, but with prolonged standing she has back pain. She has semi-compliant with HEP exercises.  From Eval: Patient presents with increased back pain that began 3-4 months ago. Her pain is aggravated with bending and lifting activities. She is currently doing home renovations and her back hurts worse when she does a lot of activity. Last weekend she repaired a deck. Denies numbness and tingling down legs. She also has  bilateral hip pain that she has  been treated in the clinic before for Lt> Rt.   PERTINENT HISTORY:  Anemia; Anxiety; Depression; type 1 diabetes; HTN;   PAIN:  Are you having pain? Yes: NPRS scale: 3(worst) 12(worst)/10 Pain location: mid to lower back Rt > Lt  Pain description: sharp Aggravating factors: bending, lifting, standing still ; walking>1.5 miles  Relieving factors: Heat  PRECAUTIONS: None  RED FLAGS: None   WEIGHT BEARING RESTRICTIONS: No  FALLS:  Has patient fallen in last 6 months? No  LIVING ENVIRONMENT: Lives with: lives with their family Lives in: House/apartment Stairs: Yes: Internal: 2,12 steps; can reach both  OCCUPATION: Not currently working  PLOF: Independent, Independent with basic ADLs, Independent with household mobility without device, Independent with community mobility without device, Independent with gait, and Independent with transfers  PATIENT GOALS: A better way to deal with pain  NEXT MD VISIT: 08/29/2024  OBJECTIVE:  Note: Objective measures were completed at Evaluation unless otherwise noted.  DIAGNOSTIC FINDINGS:  Lumbar MRI 9/22: IMPRESSION: Mild disc desiccation and mild facet arthrosis. No significant foraminal or spinal stenosis.  Lumbar X ray 9/10 FINDINGS: There is no evidence of lumbar spine fracture. Alignment is normal. Intervertebral disc spaces are maintained.  PATIENT SURVEYS:  Modified Oswestry: 9/50 18%   Minimally Clinically Important Difference (MCID) = 12.8%  COGNITION: Overall cognitive status: Within functional limits for tasks assessed     SENSATION: WFL  MUSCLE LENGTH: Hamstrings: adequate muscle length    POSTURE: rounded shoulders and forward head  PALPATION: Tenderness lower Tspine into lumbar spine Tenderness QL bilateral  Pain with Grade I joint mobs lower Tspine into lumbar spine  LUMBAR ROM: *pain  AROM eval  Flexion Bends to toes  Extension 50% limited*  Right lateral flexion  Bends to above knee joint  Left lateral flexion Bend to above knee joint  Right rotation 15% limited *  Left rotation 100%   (Blank rows = not tested)  LOWER EXTREMITY ROM:   WFL bilateral   LOWER EXTREMITY MMT:  Grossly 4 to 4+/5    FUNCTIONAL TESTS:  5 times sit to stand: 9.62 sec no hands (left hip pain) Timed up and go (TUG): 7.32  GAIT: Comments: Unremarkable   TREATMENT DATE:  08/13/2024 Recumbent Bike Level 2 -5 mins PT present to discuss status LTR x 8 sec direction 5 sec hold Open Books x 10 each side Childs Pose 2 x 20 sec Childs Pose + sidebend  x 20 sec each side Hooklying TA activation x 20 (max verbal and tactile cues for correct activation) Hooklying TA activation + bent knee fall out x 10 bilateral  Hooklying alt hand and knee press (only using right leg) x 10 bilateral  Hooklying TA activation + ball squeeze 2 x 10 Standing doorway QL stretch 2 x 20 sec bilateral  Pallof press with red TB x 12 each direction     07/22/2024 Initial Evaluation & HEP created  Education on Examination findings and what we will focus on with PT   PATIENT EDUCATION:  Education details: Provided patient education and discussion on quality of life and stress management. Discussed motivators, challenges, and goals for future.  Person educated: Patient Education method: Explanation, Demonstration, and Handouts Education comprehension: verbalized understanding, returned demonstration, and needs further education  HOME EXERCISE PROGRAM: Access Code: [email or web address] URL: [email or web address] Date: 08/13/2024 Prepared by: Kristeen Sar  Exercises - Supine Lower Trunk Rotation  - 1 x daily - 7 x weekly - 1 sets - 10 reps - 5 hold - Sidelying Thoracic Rotation with Open Book  - 1 x daily - 7 x weekly - 1 sets - 10 reps - Standing Quadratus Lumborum  Stretch with Doorway  - 1 x daily - 7 x weekly - 2-3 sets - 20-30s hold - Child's Pose Stretch  - 1 x daily - 7 x weekly - 2 sets - 20-30s hold - Child's Pose with Sidebending  - 1 x daily - 7 x weekly - 2 sets - 20-30s hold - Supine Transversus Abdominis Bracing - Hands on Stomach  - 1 x daily - 7 x weekly - 2 sets - 10 reps - Bent Knee Fallouts  - 1 x daily - 7 x weekly - 1 sets - 10 reps  ASSESSMENT:  CLINICAL IMPRESSION: Raizel presents to first follow up appointment since evaluation. She verbalized semi-compliance to HEP. Reviewed exercises and provided cues as needed. Treatment session focused on introducing TA activation exercises. PT provided verbal and tactile cues for correct activation. Educated patient on correct breathing technique while performing exercises. Updated HEP to include TA activation exercises.Overall, patient should respond well to skilled therapy.    OBJECTIVE IMPAIRMENTS: decreased mobility, difficulty walking, decreased ROM, decreased strength, hypomobility, increased muscle spasms, impaired flexibility, postural dysfunction, and pain.   ACTIVITY LIMITATIONS: carrying, lifting, bending, sitting, standing, squatting, sleeping, stairs, and locomotion level  PARTICIPATION LIMITATIONS: meal prep, cleaning, laundry, and driving  PERSONAL FACTORS: Fitness, Time since onset of injury/illness/exacerbation, and 3+ comorbidities: Anxiety; Depression; type 1 diabetes; HTN; are also affecting patient's functional outcome.   REHAB POTENTIAL: Good  CLINICAL DECISION MAKING: Evolving/moderate complexity  EVALUATION COMPLEXITY: Moderate   GOALS: Goals reviewed with patient? Yes  SHORT TERM GOALS: Target date: 08/19/2024  Patient will be independent with initial HEP. Baseline:  Goal status: INITIAL  2.  Patient will report > or = to 30% improvement in back pain since starting PT. Baseline:  Goal status: INITIAL   3.  Patient will demonstrate appropriate TA  activation with no cues. Baseline:  Goal status: INITIAL   LONG TERM GOALS: Target date: 09/19/2024  Patient will demonstrate independence in advanced HEP. Baseline:  Goal status: INITIAL  2.  Patient will report > or = to 70% improvement in back pain since starting PT. Baseline:  Goal status: INITIAL  3.  Patient will verbalize and demonstrate self-care strategies to manage pain including tissue mobility practices and change of position. Baseline:  Goal status: INITIAL   4.  Patient will demonstrate correct lifting and bending technique to prevent mechanical strain and further injury. Baseline:  Goal status: INITIAL  5.  Patient will be able to walk at least 2 miles without any pain or discomfort from her back for improved community negotiation of trips. Baseline:  Goal status: INITIAL   PLAN:  PT FREQUENCY: 1-2x/week  PT DURATION: 8 weeks  PLANNED INTERVENTIONS: 97164- PT Re-evaluation, 97110-Therapeutic exercises, 97530- Therapeutic activity, [id number]- Neuromuscular re-education, 97535-  Self Care, 02859- Manual therapy, [phone]- Canalith repositioning, [id number]- Aquatic Therapy, [phone]- Electrical stimulation (unattended), [phone]- Electrical stimulation (manual), [id number]- Vasopneumatic device, [id number]- Ultrasound, [id number]- Traction (mechanical), [id number]- Ionotophoresis 4mg /ml Dexamethasone, 20560 (1-2 muscles), 20561 (3+ muscles)- Dry Needling, Patient/Family education, Balance training, Stair training, Taping, Joint mobilization, Joint manipulation, Spinal manipulation, Spinal mobilization, Vestibular training, Cryotherapy, and Moist heat.  PLAN FOR NEXT SESSION: assess TA activation and breathing technique; continue progressing   Kristeen Sar, PT, DPT 08/13/24 11:26 AM [facility] [address], [address] [place], [place] [address] Phone # [phone] Fax [phone]

## 2024-08-20 ENCOUNTER — Ambulatory Visit: Admitting: Physical Therapy

## 2024-08-27 ENCOUNTER — Ambulatory Visit: Admitting: Professional Counselor

## 2024-08-28 ENCOUNTER — Ambulatory Visit: Admitting: Physical Therapy

## 2024-08-28 NOTE — Progress Notes (Deleted)
 Ben Jackson D.CLEMENTEEN [facility] [address] [place] [address] Phone: [phone]   Assessment and Plan:     ***    Pertinent previous records reviewed include ***   Follow Up: ***     Subjective:   I, Chestine Reeves, am serving as a neurosurgeon for Doctor Morene Mace    Chief Complaint: leg pain    HPI:    02/28/2023 Patient is a 38 year old female complaining of leg pain. Patient states that she had left pain for years, she was dx with bursitis, she used to get CSI but the pain has changed, she had pain when sitting for long time, and then has pain when trying to walk after sitting , pain radiates to the back of the leg, sleep is hard she is a side sleeper, no numbness or tingling, tylenol  doesn't help with the pain, she states she used to hear hip pop but doesn't do that stretching anymore that would cause the popping, she is TTP in that area , she states she can feel a knot    03/26/2023 Patient states she had some pain while on vacation. Back pain has been more frequent when she is in a sitting position for a long period of time. Left hip has been killing her   04/17/2023 Patient states after CSI pain calmed down . Went swimming yesterday so she is a little flared    06/20/2023 Patient states past couple of weeks leg pain has flared. PT tomorrow    07/19/2023 Patient states she is doing okay MRI caused some pain for a couple of days     08/21/2023 Patient states she is getting better. Notices pain when she is walking or sitting for long periods of time   01/01/2024 Patient states right now it is ok. It has become really bothersome if she sits too long. Sleep is uncomfortable where she has to keep moving around to set comfortable.  Started to flare back up this past month but was doing well before then. Had some pain in the front almost on the bone on the left side.    01/29/2024 Patient states she is doing a little better. Sat and  Sunday she was flared was driving back from the beach. Started PT    03/13/2024 Patient states in a little pain. Been doing physical therapy but it doesn't seem like that it is helping. The front pain in the hip area has gotten worse especially when she tries to get up and walk. Would like to know if it is time for another injection.    06/10/2024 Patient states she has had increased amount of pain at night . Wider area of pain now    07/04/2024 Patient states she is okay   08/29/2024 Patient states   Relevant Historical Information: Hypertension, DM type I, CKD    Additional pertinent review of systems negative.   Current Outpatient Medications:    albuterol  (PROVENTIL ) (2.5 MG/3ML) 0.083% nebulizer solution, Take 3 mLs (2.5 mg total) by nebulization every 6 (six) hours as needed for wheezing or shortness of breath., Disp: 150 mL, Rfl: 1   albuterol  (VENTOLIN  HFA) 108 (90 Base) MCG/ACT inhaler, TAKE 2 PUFFS BY MOUTH EVERY 6 HOURS AS NEEDED FOR WHEEZE OR SHORTNESS OF BREATH, Disp: 18 each, Rfl: 2   amoxicillin -clavulanate (AUGMENTIN ) 875-125 MG tablet, Take 1 tablet by mouth 2 (two) times daily., Disp: 20 tablet, Rfl: 0   aspirin  81 MG chewable  tablet, Chew 1 tablet (81 mg total) by mouth at bedtime., Disp: , Rfl:    benzonatate  (TESSALON  PERLES) 100 MG capsule, Take 1 capsule (100 mg total) by mouth 3 (three) times daily as needed., Disp: 30 capsule, Rfl: 0   Budeson-Glycopyrrol-Formoterol  (BREZTRI AEROSPHERE) 160-9-4.8 MCG/ACT AERO, Inhale 2 puffs into the lungs 2 (two) times daily., Disp: , Rfl:    cloNIDine  (CATAPRES ) 0.1 MG tablet, Take 1 tablet (0.1 mg total) by mouth 2 (two) times daily as needed (anxiety)., Disp: 60 tablet, Rfl: 0   FASENRA 30 MG/ML SOSY, Inject into the skin every 3 (three) months., Disp: , Rfl:    GLUCAGON EMERGENCY 1 MG injection, Inject 1 mg into the skin as needed (hypoglycemia.)., Disp: , Rfl:    glucose blood (ACCU-CHEK GUIDE) test strip, Use to check blood  sugar 3 time a day, Disp: 300 strip, Rfl: 4   insulin  glargine, 1 Unit Dial, (TOUJEO  SOLOSTAR) 300 UNIT/ML Solostar Pen, Inject 15 Units into the skin daily., Disp: 4.5 mL, Rfl: 0   insulin  lispro (HUMALOG ) 100 UNIT/ML injection, Inject up to 150 units daily according to insulin  pump settings., Disp: 10 mL, Rfl: 0   levonorgestrel  (MIRENA , 52 MG,) 20 MCG/24HR IUD, 1 each by Intrauterine route once., Disp: , Rfl:    losartan  (COZAAR ) 25 MG tablet, Take 12.5 mg by mouth daily. Pt takes 12.5 mg. 12/31/23., Disp: , Rfl:    lumateperone  tosylate 21 MG CAPS, Take 21 mg by mouth daily., Disp: 30 capsule, Rfl: 2   montelukast  (SINGULAIR ) 10 MG tablet, Take 1 tablet (10 mg total) by mouth at bedtime., Disp: 90 tablet, Rfl: 1   MOUNJARO  10 MG/0.5ML Pen, Inject 10 mg into the skin once a week., Disp: , Rfl:    pravastatin  (PRAVACHOL ) 20 MG tablet, Take 1 tablet (20 mg total) by mouth at bedtime., Disp: 90 tablet, Rfl: 3   sertraline  (ZOLOFT ) 50 MG tablet, Take 1 tablet (50 mg total) by mouth daily., Disp: 90 tablet, Rfl: 0   Ubrogepant  (UBRELVY ) 100 MG TABS, Take 1 tablet (100 mg total) by mouth daily as needed (migraine headaches.)., Disp: 30 tablet, Rfl: 3   valACYclovir  (VALTREX ) 1000 MG tablet, Take 2 tablets (2,000 mg total) by mouth 2 (two) times daily., Disp: 4 tablet, Rfl: 3   Objective:     There were no vitals filed for this visit.    There is no height or weight on file to calculate BMI.    Physical Exam:    ***   Electronically signed by:  [name] 7:31 AM 08/28/24

## 2024-08-29 ENCOUNTER — Ambulatory Visit: Admitting: Sports Medicine

## 2024-09-01 ENCOUNTER — Ambulatory Visit: Admitting: Physical Therapy

## 2024-09-03 ENCOUNTER — Ambulatory Visit: Admitting: Family Medicine

## 2024-09-03 VITALS — BP 124/72 | HR 96 | Ht 66.0 in | Wt 150.6 lb

## 2024-09-03 DIAGNOSIS — Z79899 Other long term (current) drug therapy: Secondary | ICD-10-CM | POA: Diagnosis not present

## 2024-09-03 DIAGNOSIS — G43709 Chronic migraine without aura, not intractable, without status migrainosus: Secondary | ICD-10-CM | POA: Diagnosis not present

## 2024-09-03 DIAGNOSIS — N1832 Chronic kidney disease, stage 3b: Secondary | ICD-10-CM

## 2024-09-03 DIAGNOSIS — J45909 Unspecified asthma, uncomplicated: Secondary | ICD-10-CM

## 2024-09-03 DIAGNOSIS — Z6824 Body mass index (BMI) 24.0-24.9, adult: Secondary | ICD-10-CM

## 2024-09-03 DIAGNOSIS — Z8639 Personal history of other endocrine, nutritional and metabolic disease: Secondary | ICD-10-CM

## 2024-09-03 DIAGNOSIS — E1122 Type 2 diabetes mellitus with diabetic chronic kidney disease: Secondary | ICD-10-CM

## 2024-09-03 DIAGNOSIS — R5383 Other fatigue: Secondary | ICD-10-CM

## 2024-09-03 DIAGNOSIS — E559 Vitamin D deficiency, unspecified: Secondary | ICD-10-CM | POA: Diagnosis not present

## 2024-09-03 DIAGNOSIS — M7918 Myalgia, other site: Secondary | ICD-10-CM | POA: Diagnosis not present

## 2024-09-03 DIAGNOSIS — E1022 Type 1 diabetes mellitus with diabetic chronic kidney disease: Secondary | ICD-10-CM

## 2024-09-03 LAB — CBC WITH DIFFERENTIAL/PLATELET
Basophils Absolute: 0.1 K/uL (ref 0.0–0.1)
Basophils Relative: 2.1 % (ref 0.0–3.0)
Eosinophils Absolute: 0.4 K/uL (ref 0.0–0.7)
Eosinophils Relative: 6.6 % — ABNORMAL HIGH (ref 0.0–5.0)
HCT: 44.6 % (ref 36.0–46.0)
Hemoglobin: 14.8 g/dL (ref 12.0–15.0)
Lymphocytes Relative: 32.8 % (ref 12.0–46.0)
Lymphs Abs: 1.8 K/uL (ref 0.7–4.0)
MCHC: 33.2 g/dL (ref 30.0–36.0)
MCV: 87.6 fl (ref 78.0–100.0)
Monocytes Absolute: 0.5 K/uL (ref 0.1–1.0)
Monocytes Relative: 8.2 % (ref 3.0–12.0)
Neutro Abs: 2.8 K/uL (ref 1.4–7.7)
Neutrophils Relative %: 50.3 % (ref 43.0–77.0)
Platelets: 307 K/uL (ref 150.0–400.0)
RBC: 5.09 Mil/uL (ref 3.87–5.11)
RDW: 14.2 % (ref 11.5–15.5)
WBC: 5.5 K/uL (ref 4.0–10.5)

## 2024-09-03 LAB — COMPREHENSIVE METABOLIC PANEL WITH GFR
ALT: 10 U/L (ref 0–35)
AST: 13 U/L (ref 0–37)
Albumin: 4 g/dL (ref 3.5–5.2)
Alkaline Phosphatase: 113 U/L (ref 39–117)
BUN: 14 mg/dL (ref 6–23)
CO2: 30 meq/L (ref 19–32)
Calcium: 9 mg/dL (ref 8.4–10.5)
Chloride: 99 meq/L (ref 96–112)
Creatinine, Ser: 1.07 mg/dL (ref 0.40–1.20)
GFR: 65.33 mL/min (ref >60.00)
Glucose, Bld: 205 mg/dL — ABNORMAL HIGH (ref 70–99)
Potassium: 3.8 meq/L (ref 3.5–5.1)
Sodium: 136 meq/L (ref 135–145)
Total Bilirubin: 0.7 mg/dL (ref 0.2–1.2)
Total Protein: 6.7 g/dL (ref 6.0–8.3)

## 2024-09-03 LAB — VITAMIN B12: Vitamin B-12: 305 pg/mL (ref 211–911)

## 2024-09-03 LAB — VITAMIN D 25 HYDROXY (VIT D DEFICIENCY, FRACTURES): VITD: 40.8 ng/mL (ref 30.00–100.00)

## 2024-09-03 MED ORDER — MONTELUKAST SODIUM 10 MG PO TABS: 10.0000 mg | ORAL_TABLET | Freq: Every day | ORAL | 1 refills | Status: AC

## 2024-09-03 MED ORDER — PRAVASTATIN SODIUM 20 MG PO TABS: 20.0000 mg | ORAL_TABLET | Freq: Every day | ORAL | 0 refills | Status: AC

## 2024-09-03 MED ORDER — EMGALITY 120 MG/ML ~~LOC~~ SOAJ: 120.0000 mg | SUBCUTANEOUS | 3 refills | Status: AC

## 2024-09-03 MED ORDER — TIZANIDINE HCL 2 MG PO TABS
2.0000 mg | ORAL_TABLET | Freq: Three times a day (TID) | ORAL | 0 refills | Status: AC | PRN
Start: 2024-09-03 — End: ?

## 2024-09-03 NOTE — Progress Notes (Signed)
 Assessment and Plan 1. Myofascial pain   2. Moderate asthma without complication, seasonal   3. Medication management   4. CKD stage 3 secondary to diabetes [facility]), followed by Renal   5. Chronic migraine without aura, uncontrolled due to insurance denial   6. Other fatigue   7. Vitamin D  deficiency   8. Type 1 diabetes mellitus with stage 3b chronic kidney disease (HCC), followed by Endocrinology   9. History of obesity   10. Normal weight with body mass index (BMI) of 24.0 to 24.9 in adult    Orders Placed This Encounter  Procedures  . Comprehensive metabolic panel with GFR  . Vitamin B12  . CBC with Differential/Platelet  . VITAMIN D  25 Hydroxy (Vit-D Deficiency, Fractures)  . Iron, TIBC and Ferritin Panel   Meds ordered this encounter  Medications  . pravastatin  (PRAVACHOL ) 20 MG tablet    Sig: Take 1 tablet (20 mg total) by mouth at bedtime.    Dispense:  90 tablet    Refill:  0  . montelukast  (SINGULAIR ) 10 MG tablet    Sig: Take 1 tablet (10 mg total) by mouth at bedtime.    Dispense:  90 tablet    Refill:  1   Prior Authorization Pt with chronic migraine (multiple migraine days/month). No MOH. Has bipolar disorder ? cannot use TCAs/SNRIs. Has CKD3 from type 1 DM ? avoid topiramate ; beta-blockers poorly tolerated/less safe. Ajovy previously ineffective. Pt had excellent prior response to Emgality  (? migraine days, ? function, well-tolerated). Symptoms recurred after stopping. Given prior benefit + contraindications to standard preventives + failure of Ajovy, Emgality  is medically necessary. Plan: Restart Emgality  120 mg SQ monthly (after loading dose). Monitor for injection-site reactions. F/U: 8-12 wks to document response for continuation.  I personally spent a total of 45 minutes in the care of the patient today including getting/reviewing separately obtained history, counseling and educating, placing orders, and documenting clinical information in the  EHR. Assessment & Plan Myofascial pain syndrome Chronic hip and lower back pain, relieved by hip injections but recurring. Differential includes fibromyalgia. Possible links to sleep disturbances and medication side effects. Nutritional deficiencies considered. - Ordered labs for B12, ferritin, vitamin D , and folate. - Prescribed tizanidine  for muscle relaxation. - Encouraged sleep hygiene and stress reduction. - Consider combination medications for fibromyalgia if symptoms persist.  Type 1 diabetes mellitus Blood sugar fluctuations with sensor inaccuracies. Awaiting new pump due to insurance issues. Current management includes Mounjaro  15 mg with variable dosing. - Advised taking Mounjaro  every seven days until blood sugars stabilize. - Await new pump approval in January.  Migraine Increased frequency, possibly related to seasonal changes and pain. Previous use of Emgality  effective but hindered by insurance issues. Current management includes Ubrelvy  for rescue. - Attempted to obtain insurance approval for Emgality . - Continue Ubrelvy  for migraine rescue.  Bipolar disorder Current management with Caplyta  causing grogginess. Previous medications include Vraylar  and Abilify . Mood stabilizer may aid sleep improvement. - Consider adjusting Caplyta  dosing to earlier in the evening. - Discuss alternative mood stabilizers with psychiatrist if needed.  Asthma Exacerbated by seasonal changes and allergies.  Constipation Intermittent constipation with variable bowel movements. No recent use of Linzess .  Geni Shutter, DO, MS, FAAFP, Dipl. [facility] [address] [place], [address] Dept: [phone] Dept Fax: [phone]  Subjective:   Patient is well known to me from my previous clinic and is now establishing care with me as their primary care  provider. Discussed the use of AI scribe software for clinical note transcription with  the patient, who gave verbal consent to proceed.  History of Present Illness [name] is a 38 year old female with diabetes and chronic pain who presents with sleep disturbances and pain management issues.  Sleep disturbances - Difficulty initiating sleep - Frequent nocturnal awakenings, typically around 2 or 3 AM - Prolonged periods of wakefulness during the night - Nocturia contributing to sleep disruption - Sleep disturbances have worsened recently - Pain in hips and lower back exacerbates sleep issues  Chronic musculoskeletal pain - Persistent pain in hips and lower back - Muscle tightness and tenderness to touch - Pain worsened by prolonged standing - Hip injections provide only temporary relief - Suspects fibromyalgia as a contributing factor  Glycemic variability - Diabetes managed with Mounjaro  15 mg, administered every 10 days or longer - Blood glucose fluctuations, potentially related to sleep disturbances and pain - Uses a sensor for glucose monitoring, but experiences calibration issues - Awaiting a new insulin  pump  Migraine headaches - Migraines managed with Ubrelvy  - Increase in migraine frequency during seasonal changes and pain exacerbations - Asthma and allergies can exacerbate migraines  Bipolar disorder and alertness - Caplyta  used for bipolar disorder, but not taken daily due to grogginess - Vyvanse  taken for alertness; experiences a 'three o'clock crash' if dose is missed  Gastrointestinal symptoms - Irregular bowel movements - Episodes of constipation  Body mass index and weight fluctuation - BMI is 24 - Weight fluctuates between 143 and 147 pounds  All past medical history, surgical history, allergies, family history, immunizations andmedications were updated in the EMR today and reviewed under the history and medication portions of their EMR. Review of Systems: Negative, with the exception of above mentioned in HPI.  Current  Outpatient Medications:  .  albuterol  (PROVENTIL ) (2.5 MG/3ML) 0.083% nebulizer solution, Take 3 mLs (2.5 mg total) by nebulization every 6 (six) hours as needed for wheezing or shortness of breath., Disp: 150 mL, Rfl: 1 .  albuterol  (VENTOLIN  HFA) 108 (90 Base) MCG/ACT inhaler, TAKE 2 PUFFS BY MOUTH EVERY 6 HOURS AS NEEDED FOR WHEEZE OR SHORTNESS OF BREATH, Disp: 18 each, Rfl: 2 .  aspirin  81 MG chewable tablet, Chew 1 tablet (81 mg total) by mouth at bedtime., Disp: , Rfl:  .  Budeson-Glycopyrrol-Formoterol  (BREZTRI AEROSPHERE) 160-9-4.8 MCG/ACT AERO, Inhale 2 puffs into the lungs 2 (two) times daily., Disp: , Rfl:  .  cloNIDine  (CATAPRES ) 0.1 MG tablet, Take 1 tablet (0.1 mg total) by mouth 2 (two) times daily as needed (anxiety)., Disp: 60 tablet, Rfl: 0 .  furosemide (LASIX) 20 MG tablet, Take 20 mg by mouth daily as needed., Disp: , Rfl:  .  GLUCAGON EMERGENCY 1 MG injection, Inject 1 mg into the skin as needed (hypoglycemia.)., Disp: , Rfl:  .  glucose blood (ACCU-CHEK GUIDE) test strip, Use to check blood sugar 3 time a day, Disp: 300 strip, Rfl: 4 .  insulin  glargine, 1 Unit Dial, (TOUJEO  SOLOSTAR) 300 UNIT/ML Solostar Pen, Inject 15 Units into the skin daily., Disp: 4.5 mL, Rfl: 0 .  insulin  lispro (HUMALOG ) 100 UNIT/ML injection, Inject up to 150 units daily according to insulin  pump settings., Disp: 10 mL, Rfl: 0 .  levonorgestrel  (MIRENA , 52 MG,) 20 MCG/24HR IUD, 1 each by Intrauterine route once., Disp: , Rfl:  .  LINZESS  72 MCG capsule, Take 72 mcg by mouth daily as needed., Disp: , Rfl:  .  lisdexamfetamine (  VYVANSE ) 40 MG capsule, Take 40 mg by mouth daily., Disp: , Rfl:  .  losartan  (COZAAR ) 25 MG tablet, Take 12.5 mg by mouth daily. Pt takes 12.5 mg. 12/31/23., Disp: , Rfl:  .  lumateperone  tosylate 21 MG CAPS, Take 21 mg by mouth daily., Disp: 30 capsule, Rfl: 2 .  sertraline  (ZOLOFT ) 50 MG tablet, Take 1 tablet (50 mg total) by mouth daily., Disp: 90 tablet, Rfl: 0 .  tirzepatide   (MOUNJARO ) 15 MG/0.5ML Pen, Inject 15 mg into the skin once a week., Disp: , Rfl:  .  Ubrogepant  (UBRELVY ) 100 MG TABS, Take 1 tablet (100 mg total) by mouth daily as needed (migraine headaches.)., Disp: 30 tablet, Rfl: 3 .  valACYclovir  (VALTREX ) 1000 MG tablet, Take 2 tablets (2,000 mg total) by mouth 2 (two) times daily., Disp: 4 tablet, Rfl: 3 .  FASENRA 30 MG/ML SOSY, Inject into the skin every 3 (three) months. (Patient not taking: Reported on 09/03/2024), Disp: , Rfl:  .  montelukast  (SINGULAIR ) 10 MG tablet, Take 1 tablet (10 mg total) by mouth at bedtime., Disp: 90 tablet, Rfl: 1 .  pravastatin  (PRAVACHOL ) 20 MG tablet, Take 1 tablet (20 mg total) by mouth at bedtime., Disp: 90 tablet, Rfl: 0   Objective:   BP 124/72 (BP Location: Right Arm, Cuff Size: Normal)   Pulse 96   Ht 5' 6 (1.676 m)   Wt 150 lb 9.6 oz (68.3 kg)   SpO2 99%   BMI 24.31 kg/m   Wt Readings from Last 3 Encounters:  09/03/24 150 lb 9.6 oz (68.3 kg)  07/22/24 149 lb (67.6 kg)  07/11/24 148 lb (67.1 kg)   Physical Exam Vitals and nursing note reviewed.  HENT:     Head: Normocephalic and atraumatic.  Eyes:     Pupils: Pupils are equal, round, and reactive to light.  Cardiovascular:     Rate and Rhythm: Normal rate and regular rhythm.     Heart sounds: Normal heart sounds.  Pulmonary:     Effort: Pulmonary effort is normal.  Abdominal:     Palpations: Abdomen is soft.  Musculoskeletal:     Cervical back: Normal range of motion and neck supple.     Comments: Diffuse tenderness to palpation over >= 6-8 classic tender point regions (trapezius, cervical paraspinals, lateral epicondyles, costochondral areas, gluteal, greater trochanters, medial knees). No joint swelling, warmth, or deformity. Full ROM throughout.  Skin:    General: Skin is warm.  Psychiatric:        Behavior: Behavior normal.     Lab Results  Component Value Date   CREATININE 0.95 07/08/2023   BUN 22 (H) 07/08/2023   NA 132 (L)  07/08/2023   K 4.0 07/08/2023   CL 97 (L) 07/08/2023   CO2 29 07/08/2023   Lab Results  Component Value Date   ALT 18 07/08/2023   AST 18 07/08/2023   ALKPHOS 108 07/08/2023   BILITOT 0.5 07/08/2023   Lab Results  Component Value Date   HGBA1C 10.8 (H) 07/08/2023   HGBA1C 9.3 (H) 11/02/2022   HGBA1C 9.2 03/17/2021   HGBA1C 9.8 (H) 07/29/2020   HGBA1C 9.5 (H) 04/15/2020   No results found for: INSULIN  Lab Results  Component Value Date   TSH 1.650 04/04/2021   Lab Results  Component Value Date   CHOL 127 11/02/2022   HDL 66 11/02/2022   LDLCALC 47 11/02/2022   TRIG 56 11/02/2022   CHOLHDL 1.9 11/02/2022   Lab Results  Component Value Date   VD25OH 39.4 04/04/2021   VD25OH 32.0 06/30/2020   VD25OH 40.5 03/01/2020   Lab Results  Component Value Date   WBC 9.6 07/08/2023   HGB 14.7 07/08/2023   HCT 44.7 07/08/2023   MCV 86.5 07/08/2023   PLT 317 07/08/2023   Lab Results  Component Value Date   IRON 41 11/13/2019   TIBC 267 11/13/2019   FERRITIN 77 11/13/2019

## 2024-09-10 ENCOUNTER — Telehealth: Payer: Self-pay

## 2024-09-10 ENCOUNTER — Other Ambulatory Visit (HOSPITAL_COMMUNITY): Payer: Self-pay

## 2024-09-10 ENCOUNTER — Ambulatory Visit: Admitting: Physical Therapy

## 2024-09-10 NOTE — Telephone Encounter (Signed)
 Pharmacy Patient Advocate Encounter   Received notification from Onbase that prior authorization for Emgality  120MG /ML auto-injectors (migraine) is required/requested.   Insurance verification completed.   The patient is insured through [facility].   Per test claim: PA required; PA submitted to above mentioned insurance via Latent Key/confirmation #/EOC [id number] Status is pending

## 2024-09-11 ENCOUNTER — Other Ambulatory Visit (HOSPITAL_COMMUNITY): Payer: Self-pay

## 2024-09-11 NOTE — Telephone Encounter (Signed)
 Additional information has been requested from the patient's insurance in order to proceed with the prior authorization request. Requested information has been sent, or form has been filled out and faxed back to [phone]

## 2024-09-11 NOTE — Telephone Encounter (Signed)
 Pharmacy Patient Advocate Encounter  Received notification from [facility] that Prior Authorization for  Emgality  120MG /ML auto-injectors (migraine)  has been APPROVED from 10/10/23 to 09/11/25. Ran test claim, Copay is $0. This test claim was processed through [facility]- copay amounts may vary at other pharmacies due to pharmacy/plan contracts, or as the patient moves through the different stages of their insurance plan.   PA #/Case ID/Reference #: [id number]     Pharmacy closed for lunch. Left voicemail.

## 2024-09-17 ENCOUNTER — Encounter: Payer: Self-pay | Admitting: Physical Therapy

## 2024-09-17 ENCOUNTER — Ambulatory Visit: Admitting: Physical Therapy

## 2024-09-17 DIAGNOSIS — R252 Cramp and spasm: Secondary | ICD-10-CM | POA: Insufficient documentation

## 2024-09-17 DIAGNOSIS — M5459 Other low back pain: Secondary | ICD-10-CM | POA: Diagnosis present

## 2024-09-17 DIAGNOSIS — M6281 Muscle weakness (generalized): Secondary | ICD-10-CM | POA: Diagnosis present

## 2024-09-17 DIAGNOSIS — M25552 Pain in left hip: Secondary | ICD-10-CM | POA: Insufficient documentation

## 2024-09-17 DIAGNOSIS — M25551 Pain in right hip: Secondary | ICD-10-CM | POA: Diagnosis present

## 2024-09-17 NOTE — Therapy (Signed)
 OUTPATIENT PHYSICAL THERAPY THORACOLUMBAR TREATMENT/ DISCHARGE NOTE   Patient Name: [name] MRN: [id number] DOB:01/07/1986, 38 y.o., female Today's Date: 09/17/2024  END OF SESSION:  PT End of Session - 09/17/24 1100     Visit Number 3    Date for Recertification  09/19/24    Authorization Type Medicaid/ Medicare    Progress Note Due on Visit 10    PT Start Time 1017    PT Stop Time 1052    PT Time Calculation (min) 35 min    Activity Tolerance Patient tolerated treatment well    Behavior During Therapy [facility] for tasks assessed/performed            Past Medical History:  Diagnosis Date   Anemia    Anemia of chronic disease 06/07/2012   Formatting of this note might be different from the original. Hematology consultation 07-13-16 (see note)--> Anemia of chronic disease Required 3 transfusions during pregnancy  Last Assessment & Plan:  Formatting of this note might be different from the original. She has now required 3 blood transfusions this pregnancy for her anemia of chronic disease. The most recent was one week ago when she bec   Anxiety    Asthma    Asthma 11/10/2013   Back pain    Bipolar 1 disorder (HCC)    Chest pain    Chronic renal failure syndrome, stage 3 (moderate) (HCC)    Colles' fracture of left radius 10/11/2013   Complication of anesthesia    Constipation    Depression    Depression    Diabetic gastroparesis (HCC)    Diabetic neuropathy, type I diabetes mellitus (HCC)    Diabetic ulcer of right great toe (HCC) 08/27/2020   Edema, lower extremity    Gallbladder disease    Gastroparesis    GERD (gastroesophageal reflux disease)    Headache(784.0)    Hypertension    Joint pain    Kidney stones    Palpitations    Polyneuropathy in diabetes(357.2)    PONV (postoperative nausea and vomiting)    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)    S/P carpal tunnel release left 10/16/19 11/04/2019   Shortness of breath    Tachycardia    baseline  tachycardia    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)    Past Surgical History:  Procedure Laterality Date   ANAL RECTAL MANOMETRY N/A 03/25/2018   Procedure: ANO RECTAL MANOMETRY;  Surgeon: Legrand Victory LITTIE DOUGLAS, MD;  Location: [facility];  Service: Gastroenterology;  Laterality: N/A;   CARPAL TUNNEL RELEASE Left 10/16/2019   Procedure: LEFT CARPAL TUNNEL RELEASE;  Surgeon: Margrette Taft BRAVO, MD;  Location: [facility];  Service: Orthopedics;  Laterality: Left;   CESAREAN SECTION     x 2   CHOLECYSTECTOMY     EYE SURGERY     REFRACTIVE SURGERY Bilateral    Patient Active Problem List   Diagnosis Date Noted   Bipolar disorder, current episode depressed, severe, without psychotic features (HCC) 07/10/2023   Major depressive disorder, recurrent severe without psychotic features (HCC) 07/09/2023   Chronic left hip pain 07/03/2023   Overactive bladder 02/07/2022   Recurrent cold sores 02/02/2021   Iron deficiency anemia 12/19/2020   Long term (current) use of insulin  (HCC) 12/19/2020   Migraine 11/15/2020   CKD stage 3 secondary to diabetes (HCC) 11/15/2020   Diabetic neuropathy (HCC) 11/15/2020   Type 1 diabetes mellitus with stage 3b chronic kidney disease (HCC)  08/27/2020   Polyphagia 08/27/2020   Bipolar 1 disorder, mixed, severe (HCC) 08/27/2020   Type 1 diabetes mellitus with hyperglycemia (HCC) 08/27/2020   Financial difficulties 08/27/2020   Drug-induced weight gain 08/27/2020   Traction detachment of right retina 08/27/2020   Vitreous hemorrhage of left eye (HCC) 08/27/2020   Hypertension associated with diabetes (HCC) 08/27/2020   Type 1 diabetes mellitus with diabetic polyneuropathy 08/27/2020   Class 1 obesity with serious comorbidity and body mass index (BMI) of 33.0 to 33.9 in adult 08/27/2020   Status post eye surgery 07/01/2020   Chronic migraine without aura 01/22/2020   S/P eye surgery 08/29/2019   Diplopia 07/23/2019   Epiretinal  membrane (ERM) of left eye 07/23/2019   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/2019   Pseudophakia of left eye 11/04/2018   Chronic kidney disease, stage 3b (HCC) 01/01/2017   Diabetic nephropathy associated with type 1 diabetes mellitus (HCC) 03/01/2016   After-cataract obscuring vision, left 09/22/2014   Drug overdose, intentional (HCC) 03/27/2014   Asthma 11/10/2013   MDD (major depressive disorder), recurrent episode, severe (HCC) 09/26/2013   Generalized anxiety disorder 09/26/2013   Aphakia, right eye 09/14/2012   Retinal detachment, tractional, left eye 06/11/2012   Eczema 06/07/2012   Diabetic retinopathy associated with type 1 diabetes mellitus (HCC) 04/30/2012   H/O insertion of insulin  pump 04/30/2012   History of atrial tachycardia 08/08/2010   Gastroparesis due to DM (HCC) 08/08/2010    PCP:    Duanne Butler DASEN, MD    REFERRING PROVIDER: Leonce Katz, DO  REFERRING DIAG:  Diagnosis  M54.50,G89.29 (ICD-10-CM) - Chronic bilateral low back pain without sciatica    Rationale for Evaluation and Treatment: Rehabilitation  THERAPY DIAG:  Other low back pain  Cramp and spasm  Pain of both hip joints  Muscle weakness (generalized)  ONSET DATE: 3-4 months ago  SUBJECTIVE:                                                                                                                                                                                           SUBJECTIVE STATEMENT: Patient reports her back has been really bad. She and her PCP thinks she has fibromyalgia. She has had a lot of things going on.    From Eval: Patient presents with increased back pain that began 3-4 months ago. Her pain is aggravated with bending and lifting activities. She is currently doing home renovations and her back hurts worse when she does a lot of activity. Last weekend she repaired a deck. Denies numbness and tingling down legs. She also  has bilateral hip pain that she has been treated in the clinic before for Lt> Rt.   PERTINENT HISTORY:  Anemia; Anxiety; Depression; type 1 diabetes; HTN;   PAIN:  Are you having pain? Yes: NPRS scale: 3(worst) 12(worst)/10 Pain location: mid to lower back Rt > Lt  Pain description: sharp Aggravating factors: bending, lifting, standing still ; walking>1.5 miles  Relieving factors: Heat  PRECAUTIONS: None  RED FLAGS: None   WEIGHT BEARING RESTRICTIONS: No  FALLS:  Has patient fallen in last 6 months? No  LIVING ENVIRONMENT: Lives with: lives with their family Lives in: House/apartment Stairs: Yes: Internal: 2,12 steps; can reach both  OCCUPATION: Not currently working  PLOF: Independent, Independent with basic ADLs, Independent with household mobility without device, Independent with community mobility without device, Independent with gait, and Independent with transfers  PATIENT GOALS: A better way to deal with pain  NEXT MD VISIT: 08/29/2024  OBJECTIVE:  Note: Objective measures were completed at Evaluation unless otherwise noted.  DIAGNOSTIC FINDINGS:  Lumbar MRI 9/22: IMPRESSION: Mild disc desiccation and mild facet arthrosis. No significant foraminal or spinal stenosis.  Lumbar X ray 9/10 FINDINGS: There is no evidence of lumbar spine fracture. Alignment is normal. Intervertebral disc spaces are maintained.  PATIENT SURVEYS:  Modified Oswestry: 9/50 18%   Minimally Clinically Important Difference (MCID) = 12.8%  COGNITION: Overall cognitive status: Within functional limits for tasks assessed     SENSATION: WFL  MUSCLE LENGTH: Hamstrings: adequate muscle length    POSTURE: rounded shoulders and forward head  PALPATION: Tenderness lower Tspine into lumbar spine Tenderness QL bilateral  Pain with Grade I joint mobs lower Tspine into lumbar spine  LUMBAR ROM: *pain  AROM eval  Flexion Bends to toes  Extension 50% limited*  Right lateral  flexion Bends to above knee joint  Left lateral flexion Bend to above knee joint  Right rotation 15% limited *  Left rotation 100%   (Blank rows = not tested)  LOWER EXTREMITY ROM:   WFL bilateral   LOWER EXTREMITY MMT:  Grossly 4 to 4+/5    FUNCTIONAL TESTS:  5 times sit to stand: 9.62 sec no hands (left hip pain) Timed up and go (TUG): 7.32  GAIT: Comments: Unremarkable   TREATMENT DATE:  09/17/2024 NuStep Level 5 5 mins - PT present to discuss status Standing QL/ hip flexor stretch 5 x 4 sec hold Standing L stretch at stair x 4 5 sec hold Childs Pose 2 x 20 sec Extensive patient education on mindfulness activities, diaphragmatic breathings, alternating heat and ice, activity modifications Review and update of HEP    08/13/2024 Recumbent Bike Level 2 -5 mins PT present to discuss status LTR x 8 sec direction 5 sec hold Open Books x 10 each side Childs Pose 2 x 20 sec Childs Pose + sidebend  x 20 sec each side Hooklying TA activation x 20 (max verbal and tactile cues for correct activation) Hooklying TA activation + bent knee fall out x 10 bilateral  Hooklying alt hand and knee press (only using right leg) x 10 bilateral  Hooklying TA activation + ball squeeze 2 x 10 Standing doorway QL stretch 2 x 20 sec bilateral  Pallof press with red TB x 12 each direction     07/22/2024 Initial Evaluation & HEP created  Education on Examination findings and what we will focus on with PT   PATIENT EDUCATION:  Education details: Provided patient education and discussion on quality of life and stress management. Discussed motivators, challenges, and goals for future.  Person educated: Patient Education method: Explanation, Demonstration, and Handouts Education comprehension: verbalized understanding, returned demonstration, and needs further  education  HOME EXERCISE PROGRAM: Access Code: [email or web address] URL: [email or web address] Date: 09/17/2024 Prepared by: Kristeen Sar  Exercises - Supine Lower Trunk Rotation  - 1 x daily - 7 x weekly - 1 sets - 10 reps - 5 hold - Sidelying Thoracic Rotation with Open Book  - 1 x daily - 7 x weekly - 1 sets - 10 reps - Standing Quadratus Lumborum Stretch with Doorway  - 1 x daily - 7 x weekly - 2-3 sets - 20-30s hold - Child's Pose Stretch  - 1 x daily - 7 x weekly - 2 sets - 20-30s hold - Child's Pose with Sidebending  - 1 x daily - 7 x weekly - 2 sets - 20-30s hold - Supine Transversus Abdominis Bracing - Hands on Stomach  - 1 x daily - 7 x weekly - 2 sets - 10 reps - Bent Knee Fallouts  - 1 x daily - 7 x weekly - 1 sets - 10 reps - Supine Diaphragmatic Breathing  - 1 x daily - 7 x weekly - 1 sets - 5-10 reps - Diaphragmatic Breathing at 90/90 Supported  - 1 x daily - 7 x weekly - 1 sets - 5-10 reps - Standing Hip Flexor Stretch  - 1 x daily - 7 x weekly - 5 sets - 5 hold  ASSESSMENT:  CLINICAL IMPRESSION: Aslin presents after a long lapse in treatment due to varying events that have happening. She reports her back pain has been really bad since last visit. She and her PCP thinks she has fibromyalgia and she has does some tests to see whether that is true. She had to cancel her appointment with Dr. Leonce two weeks ago, but she said she would call his office today. Majority of the treatment session was spend providing patient education on activity modification techniques to prevent exacerbations of back pain and mindfulness activities. Provided patient education and discussion on quality of life and stress management. Discussed motivators, challenges, and goals for future. Also educated patient on the possible benefits of incorporating aquatics into next POC. Will discharge patient today and she will return once she has more information about possible new diagnosis.    OBJECTIVE  IMPAIRMENTS: decreased mobility, difficulty walking, decreased ROM, decreased strength, hypomobility, increased muscle spasms, impaired flexibility, postural dysfunction, and pain.   ACTIVITY LIMITATIONS: carrying, lifting, bending, sitting, standing, squatting, sleeping, stairs, and locomotion level  PARTICIPATION LIMITATIONS: meal prep, cleaning, laundry, and driving  PERSONAL FACTORS: Fitness, Time since onset of injury/illness/exacerbation, and 3+ comorbidities: Anxiety; Depression; type 1 diabetes; HTN; are also affecting patient's functional outcome.   REHAB POTENTIAL: Good  CLINICAL DECISION MAKING: Evolving/moderate complexity  EVALUATION COMPLEXITY: Moderate   GOALS: Goals reviewed with patient? Yes  SHORT TERM GOALS: Target date: 08/19/2024  Patient will be independent with initial HEP. Baseline:  Goal status: MET  2.  Patient will report > or = to 30% improvement in back pain since starting PT. Baseline:  Goal status: NOT MET   3.  Patient will demonstrate appropriate TA activation with no cues. Baseline:  Goal status: MET   LONG TERM GOALS: Target date: 09/19/2024  Patient will demonstrate independence  in advanced HEP. Baseline:  Goal status: MET  2.  Patient will report > or = to 70% improvement in back pain since starting PT. Baseline:  Goal status: NOT MET  3.  Patient will verbalize and demonstrate self-care strategies to manage pain including tissue mobility practices and change of position. Baseline:  Goal status: MET   4.  Patient will demonstrate correct lifting and bending technique to prevent mechanical strain and further injury. Baseline:  Goal status: NOT MET  5.  Patient will be able to walk at least 2 miles without any pain or discomfort from her back for improved community negotiation of trips. Baseline:  Goal status: NOT MET   PLAN:  PT FREQUENCY: 1-2x/week  PT DURATION: 8 weeks  PLANNED INTERVENTIONS: [id number]- PT  Re-evaluation, 97110-Therapeutic exercises, 97530- Therapeutic activity, 97112- Neuromuscular re-education, 97535- Self Care, 02859- Manual therapy, [phone]- Canalith repositioning, [id number]- Aquatic Therapy, H9716- Electrical stimulation (unattended), [phone]- Electrical stimulation (manual), [id number]- Vasopneumatic device, [id number]- Ultrasound, [id number]- Traction (mechanical), [id number]- Ionotophoresis 4mg /ml Dexamethasone, 79439 (1-2 muscles), 20561 (3+ muscles)- Dry Needling, Patient/Family education, Balance training, Stair training, Taping, Joint mobilization, Joint manipulation, Spinal manipulation, Spinal mobilization, Vestibular training, Cryotherapy, and Moist heat.  PLAN FOR NEXT SESSION: patient to discharge home with HEP  Kristeen Sar, PT, DPT 09/17/24 11:01 AM [facility] [address], [address] [place], [place] [address] Phone # [phone] Fax [phone]   PHYSICAL THERAPY DISCHARGE SUMMARY  Visits from Start of Care: 3  Current functional level related to goals / functional outcomes: See above   Remaining deficits: Back and bilateral hip pain   Education / Equipment: See above   Patient agrees to discharge. Patient goals were partially met. Patient is being discharged due to the patient's request.

## 2024-09-17 NOTE — Progress Notes (Signed)
 [name] [facility] [address] [place] [address] Phone: [phone]   Assessment and Plan:     1. Chronic bilateral low back pain without sciatica (Primary) 2. Bilateral hip pain 3. Fibromyalgia -Chronic with exacerbation, subsequent visit - Continued low back pain, bilateral hip pain, leg pain that is not improving with conservative therapy.  At this time, I feel that patient's symptoms are most consistent with fibromyalgia.  Workup has been relatively unremarkable with lumbar MRI only showing mild DDD and facet arthrosis - Do not recommend chronic NSAID use with past medical history of CKD.  Recommend limiting p.o. prednisone  with past medical history of DM type I - Continue HEP - Start Cymbalta  30 mg daily.  Discontinue sertraline  50 mg daily.  With low doses of both medications, sertraline  can be stopped today and Cymbalta  started tomorrow - Discussed gabapentin , however patient has had negative side effects in the past when trialing gabapentin  and could not tolerate medication     Pertinent previous records reviewed include physical therapy notes   Follow Up: 4 weeks for reevaluation either in person or via telemedicine.  Could consider increasing Cymbalta .  Could discuss autoimmune workup to rule out rheumatologic causes with patient having past medical history of DM type I    Subjective:   I, [name], am serving as a neurosurgeon for Doctor Morene Mace    Chief Complaint: leg pain    HPI:    02/28/2023 Patient is a 38 year old female complaining of leg pain. Patient states that she had left pain for years, she was dx with bursitis, she used to get CSI but the pain has changed, she had pain when sitting for long time, and then has pain when trying to walk after sitting , pain radiates to the back of the leg, sleep is hard she is a side sleeper, no numbness or tingling, tylenol  doesn't help with the pain, she states  she used to hear hip pop but doesn't do that stretching anymore that would cause the popping, she is TTP in that area , she states she can feel a knot    03/26/2023 Patient states she had some pain while on vacation. Back pain has been more frequent when she is in a sitting position for a long period of time. Left hip has been killing her   04/17/2023 Patient states after CSI pain calmed down . Went swimming yesterday so she is a little flared    06/20/2023 Patient states past couple of weeks leg pain has flared. PT tomorrow    07/19/2023 Patient states she is doing okay MRI caused some pain for a couple of days     08/21/2023 Patient states she is getting better. Notices pain when she is walking or sitting for long periods of time   01/01/2024 Patient states right now it is ok. It has become really bothersome if she sits too long. Sleep is uncomfortable where she has to keep moving around to set comfortable.  Started to flare back up this past month but was doing well before then. Had some pain in the front almost on the bone on the left side.    01/29/2024 Patient states she is doing a little better. Sat and Sunday she was flared was driving back from the beach. Started PT    03/13/2024 Patient states in a little pain. Been doing physical therapy but it doesn't seem like that it is helping. The front  pain in the hip area has gotten worse especially when she tries to get up and walk. Would like to know if it is time for another injection.    06/10/2024 Patient states she has had increased amount of pain at night . Wider area of pain now    07/04/2024 Patient states she is okay   09/18/2024 Patient states she is flared low back and leg. Is ruling out a fibromyalgia dx. PT has not helped states her last  appointment was yesterday   Relevant Historical Information: Hypertension, DM type I, CKD  Additional pertinent review of systems negative.   Current Outpatient Medications:     DULoxetine  (CYMBALTA ) 30 MG capsule, Take 1 capsule (30 mg total) by mouth daily., Disp: 30 capsule, Rfl: 1   albuterol  (PROVENTIL ) (2.5 MG/3ML) 0.083% nebulizer solution, Take 3 mLs (2.5 mg total) by nebulization every 6 (six) hours as needed for wheezing or shortness of breath., Disp: 150 mL, Rfl: 1   albuterol  (VENTOLIN  HFA) 108 (90 Base) MCG/ACT inhaler, TAKE 2 PUFFS BY MOUTH EVERY 6 HOURS AS NEEDED FOR WHEEZE OR SHORTNESS OF BREATH, Disp: 18 each, Rfl: 2   aspirin  81 MG chewable tablet, Chew 1 tablet (81 mg total) by mouth at bedtime., Disp: , Rfl:    Budeson-Glycopyrrol-Formoterol  (BREZTRI AEROSPHERE) 160-9-4.8 MCG/ACT AERO, Inhale 2 puffs into the lungs 2 (two) times daily., Disp: , Rfl:    cloNIDine  (CATAPRES ) 0.1 MG tablet, Take 1 tablet (0.1 mg total) by mouth 2 (two) times daily as needed (anxiety)., Disp: 60 tablet, Rfl: 0   FASENRA 30 MG/ML SOSY, Inject into the skin every 3 (three) months. (Patient not taking: Reported on 09/03/2024), Disp: , Rfl:    furosemide (LASIX) 20 MG tablet, Take 20 mg by mouth daily as needed., Disp: , Rfl:    Galcanezumab -gnlm (EMGALITY ) 120 MG/ML SOAJ, Inject 120 mg into the skin every 30 (thirty) days., Disp: 1 mL, Rfl: 3   GLUCAGON EMERGENCY 1 MG injection, Inject 1 mg into the skin as needed (hypoglycemia.)., Disp: , Rfl:    glucose blood (ACCU-CHEK GUIDE) test strip, Use to check blood sugar 3 time a day, Disp: 300 strip, Rfl: 4   insulin  glargine, 1 Unit Dial, (TOUJEO  SOLOSTAR) 300 UNIT/ML Solostar Pen, Inject 15 Units into the skin daily., Disp: 4.5 mL, Rfl: 0   insulin  lispro (HUMALOG ) 100 UNIT/ML injection, Inject up to 150 units daily according to insulin  pump settings., Disp: 10 mL, Rfl: 0   levonorgestrel  (MIRENA , 52 MG,) 20 MCG/24HR IUD, 1 each by Intrauterine route once., Disp: , Rfl:    LINZESS  72 MCG capsule, Take 72 mcg by mouth daily as needed., Disp: , Rfl:    lisdexamfetamine (VYVANSE ) 40 MG capsule, Take 40 mg by mouth daily., Disp: , Rfl:     losartan  (COZAAR ) 25 MG tablet, Take 12.5 mg by mouth daily. Pt takes 12.5 mg. 12/31/23., Disp: , Rfl:    lumateperone  tosylate 21 MG CAPS, Take 21 mg by mouth daily., Disp: 30 capsule, Rfl: 2   montelukast  (SINGULAIR ) 10 MG tablet, Take 1 tablet (10 mg total) by mouth at bedtime., Disp: 90 tablet, Rfl: 1   pravastatin  (PRAVACHOL ) 20 MG tablet, Take 1 tablet (20 mg total) by mouth at bedtime., Disp: 90 tablet, Rfl: 0   sertraline  (ZOLOFT ) 50 MG tablet, Take 1 tablet (50 mg total) by mouth daily., Disp: 90 tablet, Rfl: 0   tirzepatide  (MOUNJARO ) 15 MG/0.5ML Pen, Inject 15 mg into the skin once a week., Disp: ,  Rfl:    tiZANidine  (ZANAFLEX ) 2 MG tablet, Take 1 tablet (2 mg total) by mouth every 8 (eight) hours as needed for muscle spasms., Disp: 30 tablet, Rfl: 0   Ubrogepant  (UBRELVY ) 100 MG TABS, Take 1 tablet (100 mg total) by mouth daily as needed (migraine headaches.)., Disp: 30 tablet, Rfl: 3   valACYclovir  (VALTREX ) 1000 MG tablet, Take 2 tablets (2,000 mg total) by mouth 2 (two) times daily., Disp: 4 tablet, Rfl: 3   Objective:     Vitals:   09/18/24 0854  BP: 130/82  Pulse: 94  SpO2: 98%  Weight: 148 lb (67.1 kg)  Height: 5' 6 (1.676 m)      Body mass index is 23.89 kg/m.    Physical Exam:    General: awake, alert, and oriented no acute distress, nontoxic Skin: no suspicious lesions or rashes Neuro:sensation intact distally with no deficits, normal muscle tone, no atrophy, strength 5/5 in all tested lower ext groups Psych: normal mood and affect, speech clear   Left hip/back: No deformity, swelling or wasting ROM Flexion 75, ext 20, IR 35, ER 30 TTP mildly hip flexors, greater trochanter, gluteal musculature, and moderately lumbar spine NTTP over the   si joint, Negative log roll with FROM   Positive FABER for posterior hip tension Negative FADIR   Negative Piriformis test Positive trendelenberg on left Gait normal Straight leg negative    Electronically  signed by:  Odis Mace D.CLEMENTEEN West [facility] 9:48 AM 09/18/2024

## 2024-09-18 ENCOUNTER — Ambulatory Visit (INDEPENDENT_AMBULATORY_CARE_PROVIDER_SITE_OTHER): Admitting: Sports Medicine

## 2024-09-18 VITALS — BP 130/82 | HR 94 | Ht 66.0 in | Wt 148.0 lb

## 2024-09-18 DIAGNOSIS — M797 Fibromyalgia: Secondary | ICD-10-CM | POA: Diagnosis not present

## 2024-09-18 DIAGNOSIS — M545 Low back pain, unspecified: Secondary | ICD-10-CM

## 2024-09-18 DIAGNOSIS — G8929 Other chronic pain: Secondary | ICD-10-CM | POA: Diagnosis not present

## 2024-09-18 DIAGNOSIS — M25551 Pain in right hip: Secondary | ICD-10-CM | POA: Diagnosis not present

## 2024-09-18 DIAGNOSIS — M25552 Pain in left hip: Secondary | ICD-10-CM

## 2024-09-18 MED ORDER — DULOXETINE HCL 30 MG PO CPEP: 30.0000 mg | ORAL_CAPSULE | Freq: Every day | ORAL | 1 refills | Status: DC

## 2024-09-18 NOTE — Patient Instructions (Addendum)
 Cymbalta  30 mg daily   Stop Sertraline    Continue HEP   Night sweats are not associated with tizanidine  , if they continue you should discuss with your PCP   4 week follow up in person or telemedicine

## 2024-09-26 ENCOUNTER — Ambulatory Visit: Admitting: Professional Counselor

## 2024-10-15 NOTE — Progress Notes (Signed)
 "               [name] [address] [place] [address] Phone: [phone]   Assessment and Plan:     1. Chronic bilateral low back pain without sciatica (Primary) 2. Bilateral hip pain 3. Fibromyalgia 4. Other fatigue 5. Polyarthralgia -Chronic with exacerbation, subsequent visit - Continued low back pain, bilateral hip pain, leg pain that is not improving with conservative therapy.  At this time, I feel that patient's symptoms are most consistent with fibromyalgia.  Workup has been relatively unremarkable with lumbar MRI only showing mild DDD and facet arthrosis - Do not recommend chronic NSAID use with past medical history of CKD.  Recommend limiting p.o. prednisone  with past medical history of DM type I - Continue HEP - Encouraging that patient felt improvement in symptoms with Cymbalta  30 mg.  Recommend increasing to Cymbalta  60 mg daily.  Refill provided - Previously discussed gabapentin , however patient has had negative side effects in the past when trialing gabapentin  and could not tolerate medication -Will further workup with lab work at today's visit to rule out autoimmune causes   6. Greater trochanteric bursitis of left hip -Chronic with exacerbation, subsequent visit - Recurrence of left lateral hip pain is consistent with greater trochanteric bursitis - Patient elected for greater trochanteric CSI.  Tolerated well per note below.  CSI may temporarily increase blood glucose in patient with past medical history of DM type I  Procedure: Greater trochanteric bursal injection Side: Left  Risks explained and consent was given verbally. The site was cleaned with alcohol prep. A steroid injection was performed with patient in the lateral side-lying position at area of maximum tenderness over greater trochanter using 2mL of 1% lidocaine  without epinephrine and 1mL of kenalog  40mg /ml. This was well tolerated.  Needle was removed,  hemostasis achieved, and post injection instructions were explained.  After injection, patient experienced weakness in hip flexion that lasted for 20 minutes and gradually resolved.  I suspect that injection irritated hip flexor tendon versus neurologic activation and will gradually resolve as lidocaine  wears off.  Pt was advised to call or return to clinic if these symptoms worsen or fail to improve as anticipated.    Pertinent previous records reviewed include none   Follow Up: 4 weeks for reevaluation.  Would review lab work.  Could consider further increasing Cymbalta    Subjective:   I, Moenique Parris, am serving as a neurosurgeon for Doctor Morene Mace    Chief Complaint: leg pain    HPI:    02/28/2023 Patient is a 39 year old female complaining of leg pain. Patient states that she had left pain for years, she was dx with bursitis, she used to get CSI but the pain has changed, she had pain when sitting for long time, and then has pain when trying to walk after sitting , pain radiates to the back of the leg, sleep is hard she is a side sleeper, no numbness or tingling, tylenol  doesn't help with the pain, she states she used to hear hip pop but doesn't do that stretching anymore that would cause the popping, she is TTP in that area , she states she can feel a knot    03/26/2023 Patient states she had some pain while on vacation. Back pain has been more frequent when she is in a sitting position for a long period of time. Left hip has been killing her   04/17/2023  Patient states after CSI pain calmed down . Went swimming yesterday so she is a little flared    06/20/2023 Patient states past couple of weeks leg pain has flared. PT tomorrow    07/19/2023 Patient states she is doing okay MRI caused some pain for a couple of days     08/21/2023 Patient states she is getting better. Notices pain when she is walking or sitting for long periods of time   01/01/2024 Patient states right now it  is ok. It has become really bothersome if she sits too long. Sleep is uncomfortable where she has to keep moving around to set comfortable.  Started to flare back up this past month but was doing well before then. Had some pain in the front almost on the bone on the left side.    01/29/2024 Patient states she is doing a little better. Sat and Sunday she was flared was driving back from the beach. Started PT    03/13/2024 Patient states in a little pain. Been doing physical therapy but it doesn't seem like that it is helping. The front pain in the hip area has gotten worse especially when she tries to get up and walk. Would like to know if it is time for another injection.    06/10/2024 Patient states she has had increased amount of pain at night . Wider area of pain now    07/04/2024 Patient states she is okay    09/18/2024 Patient states she is flared low back and leg. Is ruling out a fibromyalgia dx. PT has not helped states her last  appointment was yesterday  10/16/2024 Patient states last two weeks she has had increased pain. Meds worked the first week but has decreased in effectiveness. Would like to discuss an injection    Relevant Historical Information: Hypertension, DM type I, CKD  Additional pertinent review of systems negative.  Current Medications[1]   Objective:     Vitals:   10/16/24 0935  BP: 124/76  Pulse: (!) 101  SpO2: 99%  Weight: 146 lb (66.2 kg)  Height: 5' 6 (1.676 m)      Body mass index is 23.57 kg/m.    Physical Exam:    General: awake, alert, and oriented no acute distress, nontoxic Skin: no suspicious lesions or rashes Neuro:sensation intact distally with no deficits, normal muscle tone, no atrophy, strength 5/5 in all tested lower ext groups Psych: normal mood and affect, speech clear   Left hip/back: No deformity, swelling or wasting ROM Flexion 75, ext 20, IR 35, ER 30 TTP significantly  greater trochanter, and mildly hip flexors, gluteal  musculature, and moderately lumbar spine NTTP over the   si joint, Negative log roll with FROM   Positive FABER for posterior hip tension Negative FADIR   Negative Piriformis test Positive trendelenberg on left Gait normal Straight leg negative   Electronically signed by:  [name] 10:23 AM 10/16/2024     [1]  Current Outpatient Medications:    DULoxetine  (CYMBALTA ) 60 MG capsule, Take 1 capsule (60 mg total) by mouth daily., Disp: 30 capsule, Rfl: 1   albuterol  (PROVENTIL ) (2.5 MG/3ML) 0.083% nebulizer solution, Take 3 mLs (2.5 mg total) by nebulization every 6 (six) hours as needed for wheezing or shortness of breath., Disp: 150 mL, Rfl: 1   albuterol  (VENTOLIN  HFA) 108 (90 Base) MCG/ACT inhaler, TAKE 2 PUFFS BY MOUTH EVERY 6 HOURS AS NEEDED FOR WHEEZE OR SHORTNESS OF BREATH, Disp: 18  each, Rfl: 2   aspirin  81 MG chewable tablet, Chew 1 tablet (81 mg total) by mouth at bedtime., Disp: , Rfl:    Budeson-Glycopyrrol-Formoterol  (BREZTRI AEROSPHERE) 160-9-4.8 MCG/ACT AERO, Inhale 2 puffs into the lungs 2 (two) times daily., Disp: , Rfl:    cloNIDine  (CATAPRES ) 0.1 MG tablet, Take 1 tablet (0.1 mg total) by mouth 2 (two) times daily as needed (anxiety)., Disp: 60 tablet, Rfl: 0   DULoxetine  (CYMBALTA ) 30 MG capsule, Take 1 capsule (30 mg total) by mouth daily., Disp: 30 capsule, Rfl: 1   FASENRA 30 MG/ML SOSY, Inject into the skin every 3 (three) months. (Patient not taking: Reported on 09/03/2024), Disp: , Rfl:    furosemide (LASIX) 20 MG tablet, Take 20 mg by mouth daily as needed., Disp: , Rfl:    Galcanezumab -gnlm (EMGALITY ) 120 MG/ML SOAJ, Inject 120 mg into the skin every 30 (thirty) days., Disp: 1 mL, Rfl: 3   GLUCAGON EMERGENCY 1 MG injection, Inject 1 mg into the skin as needed (hypoglycemia.)., Disp: , Rfl:    glucose blood (ACCU-CHEK GUIDE) test strip, Use to check blood sugar 3 time a day, Disp: 300 strip, Rfl: 4   insulin  glargine, 1 Unit  Dial, (TOUJEO  SOLOSTAR) 300 UNIT/ML Solostar Pen, Inject 15 Units into the skin daily., Disp: 4.5 mL, Rfl: 0   insulin  lispro (HUMALOG ) 100 UNIT/ML injection, Inject up to 150 units daily according to insulin  pump settings., Disp: 10 mL, Rfl: 0   levonorgestrel  (MIRENA , 52 MG,) 20 MCG/24HR IUD, 1 each by Intrauterine route once., Disp: , Rfl:    LINZESS  72 MCG capsule, Take 72 mcg by mouth daily as needed., Disp: , Rfl:    lisdexamfetamine  (VYVANSE ) 40 MG capsule, Take 40 mg by mouth daily., Disp: , Rfl:    losartan  (COZAAR ) 25 MG tablet, Take 12.5 mg by mouth daily. Pt takes 12.5 mg. 12/31/23., Disp: , Rfl:    lumateperone  tosylate 21 MG CAPS, Take 21 mg by mouth daily., Disp: 30 capsule, Rfl: 2   montelukast  (SINGULAIR ) 10 MG tablet, Take 1 tablet (10 mg total) by mouth at bedtime., Disp: 90 tablet, Rfl: 1   pravastatin  (PRAVACHOL ) 20 MG tablet, Take 1 tablet (20 mg total) by mouth at bedtime., Disp: 90 tablet, Rfl: 0   sertraline  (ZOLOFT ) 50 MG tablet, Take 1 tablet (50 mg total) by mouth daily., Disp: 90 tablet, Rfl: 0   tirzepatide  (MOUNJARO ) 15 MG/0.5ML Pen, Inject 15 mg into the skin once a week., Disp: , Rfl:    tiZANidine  (ZANAFLEX ) 2 MG tablet, Take 1 tablet (2 mg total) by mouth every 8 (eight) hours as needed for muscle spasms., Disp: 30 tablet, Rfl: 0   Ubrogepant  (UBRELVY ) 100 MG TABS, Take 1 tablet (100 mg total) by mouth daily as needed (migraine headaches.)., Disp: 30 tablet, Rfl: 3   valACYclovir  (VALTREX ) 1000 MG tablet, Take 2 tablets (2,000 mg total) by mouth 2 (two) times daily., Disp: 4 tablet, Rfl: 3  "

## 2024-10-16 ENCOUNTER — Ambulatory Visit: Admitting: Sports Medicine

## 2024-10-16 VITALS — BP 124/76 | HR 101 | Ht 66.0 in | Wt 146.0 lb

## 2024-10-16 DIAGNOSIS — R5383 Other fatigue: Secondary | ICD-10-CM

## 2024-10-16 DIAGNOSIS — M25552 Pain in left hip: Secondary | ICD-10-CM | POA: Diagnosis not present

## 2024-10-16 DIAGNOSIS — M7062 Trochanteric bursitis, left hip: Secondary | ICD-10-CM | POA: Diagnosis not present

## 2024-10-16 DIAGNOSIS — M545 Low back pain, unspecified: Secondary | ICD-10-CM

## 2024-10-16 DIAGNOSIS — M797 Fibromyalgia: Secondary | ICD-10-CM

## 2024-10-16 DIAGNOSIS — G8929 Other chronic pain: Secondary | ICD-10-CM | POA: Diagnosis not present

## 2024-10-16 DIAGNOSIS — E20812 Autoimmune hypoparathyroidism: Secondary | ICD-10-CM

## 2024-10-16 DIAGNOSIS — M2550 Pain in unspecified joint: Secondary | ICD-10-CM

## 2024-10-16 DIAGNOSIS — M25551 Pain in right hip: Secondary | ICD-10-CM

## 2024-10-16 LAB — COMPREHENSIVE METABOLIC PANEL WITH GFR
ALT: 12 U/L (ref 3–35)
AST: 15 U/L (ref 5–37)
Albumin: 3.8 g/dL (ref 3.5–5.2)
Alkaline Phosphatase: 98 U/L (ref 39–117)
BUN: 15 mg/dL (ref 6–23)
CO2: 28 meq/L (ref 19–32)
Calcium: 8.9 mg/dL (ref 8.4–10.5)
Chloride: 97 meq/L (ref 96–112)
Creatinine, Ser: 1.1 mg/dL (ref 0.40–1.20)
GFR: 63.15 mL/min
Glucose, Bld: 460 mg/dL — ABNORMAL HIGH (ref 70–99)
Potassium: 4 meq/L (ref 3.5–5.1)
Sodium: 131 meq/L — ABNORMAL LOW (ref 135–145)
Total Bilirubin: 0.5 mg/dL (ref 0.2–1.2)
Total Protein: 6.5 g/dL (ref 6.0–8.3)

## 2024-10-16 LAB — C-REACTIVE PROTEIN: CRP: <0.5 mg/dL — ABNORMAL LOW (ref 1.0–20.0)

## 2024-10-16 LAB — CBC WITH DIFFERENTIAL/PLATELET
Basophils Absolute: 0.1 K/uL (ref 0.0–0.1)
Basophils Relative: 1.7 % (ref 0.0–3.0)
Eosinophils Absolute: 0.2 K/uL (ref 0.0–0.7)
Eosinophils Relative: 4.3 % (ref 0.0–5.0)
HCT: 41.9 % (ref 36.0–46.0)
Hemoglobin: 14 g/dL (ref 12.0–15.0)
Lymphocytes Relative: 35.3 % (ref 12.0–46.0)
Lymphs Abs: 1.4 K/uL (ref 0.7–4.0)
MCHC: 33.4 g/dL (ref 30.0–36.0)
MCV: 86.7 fl (ref 78.0–100.0)
Monocytes Absolute: 0.3 K/uL (ref 0.1–1.0)
Monocytes Relative: 8 % (ref 3.0–12.0)
Neutro Abs: 2 K/uL (ref 1.4–7.7)
Neutrophils Relative %: 50.7 % (ref 43.0–77.0)
Platelets: 246 K/uL (ref 150.0–400.0)
RBC: 4.83 Mil/uL (ref 3.87–5.11)
RDW: 13.2 % (ref 11.5–15.5)
WBC: 4 K/uL (ref 4.0–10.5)

## 2024-10-16 LAB — URIC ACID: Uric Acid, Serum: 3.5 mg/dL (ref 2.4–7.0)

## 2024-10-16 LAB — VITAMIN D 25 HYDROXY (VIT D DEFICIENCY, FRACTURES): VITD: 34.25 ng/mL (ref 30.00–100.00)

## 2024-10-16 LAB — SEDIMENTATION RATE: Sed Rate: 6 mm/h (ref 0–20)

## 2024-10-16 LAB — FERRITIN: Ferritin: 130.7 ng/mL (ref 10.0–291.0)

## 2024-10-16 LAB — TSH: TSH: 0.87 u[IU]/mL (ref 0.35–5.50)

## 2024-10-16 MED ORDER — DULOXETINE HCL 60 MG PO CPEP: 60.0000 mg | ORAL_CAPSULE | Freq: Every day | ORAL | 1 refills | Status: AC

## 2024-10-16 NOTE — Patient Instructions (Signed)
 Labs on the way out   Increase cymbalta  to 60 mg   4 week follow up

## 2024-10-17 LAB — RHEUMATOID FACTOR: Rheumatoid fact SerPl-aCnc: <10 [IU]/mL

## 2024-10-17 LAB — CYCLIC CITRUL PEPTIDE ANTIBODY, IGG: Cyclic Citrullin Peptide Ab: <16 U

## 2024-10-19 LAB — ANA+ENA+DNA/DS+SCL 70+SJOSSA/B
ANA Titer 1: POSITIVE — AB
ENA RNP Ab: <0.2 AI (ref 0.0–0.9)
ENA SM Ab Ser-aCnc: <0.2 AI (ref 0.0–0.9)
ENA SSA (RO) Ab: <0.2 AI (ref 0.0–0.9)
ENA SSB (LA) Ab: <0.2 AI (ref 0.0–0.9)
Scleroderma (Scl-70) (ENA) Antibody, IgG: <0.2 AI (ref 0.0–0.9)
dsDNA Ab: 1 [IU]/mL (ref 0–9)

## 2024-10-19 LAB — FANA STAINING PATTERNS: Speckled Pattern: 1:80 {titer}

## 2024-10-20 ENCOUNTER — Ambulatory Visit: Payer: Self-pay | Admitting: Sports Medicine

## 2024-11-10 ENCOUNTER — Ambulatory Visit: Admitting: Cardiovascular Disease

## 2024-11-10 ENCOUNTER — Encounter: Payer: Self-pay | Admitting: Cardiovascular Disease

## 2024-11-10 VITALS — BP 144/86 | HR 108 | Ht 66.0 in | Wt 150.8 lb

## 2024-11-10 DIAGNOSIS — N1832 Chronic kidney disease, stage 3b: Secondary | ICD-10-CM

## 2024-11-10 DIAGNOSIS — E1159 Type 2 diabetes mellitus with other circulatory complications: Secondary | ICD-10-CM | POA: Diagnosis not present

## 2024-11-10 DIAGNOSIS — E7800 Pure hypercholesterolemia, unspecified: Secondary | ICD-10-CM | POA: Diagnosis not present

## 2024-11-10 DIAGNOSIS — I152 Hypertension secondary to endocrine disorders: Secondary | ICD-10-CM

## 2024-11-10 DIAGNOSIS — I25118 Atherosclerotic heart disease of native coronary artery with other forms of angina pectoris: Secondary | ICD-10-CM

## 2024-11-10 DIAGNOSIS — I701 Atherosclerosis of renal artery: Secondary | ICD-10-CM | POA: Diagnosis not present

## 2024-11-10 DIAGNOSIS — E108 Type 1 diabetes mellitus with unspecified complications: Secondary | ICD-10-CM | POA: Diagnosis not present

## 2024-11-10 MED ORDER — CARVEDILOL 6.25 MG PO TABS: 3.1250 mg | ORAL_TABLET | Freq: Two times a day (BID) | ORAL | 3 refills | Status: AC

## 2024-11-10 MED ORDER — LOSARTAN POTASSIUM 50 MG PO TABS: 50.0000 mg | ORAL_TABLET | Freq: Every day | ORAL | 3 refills | Status: AC

## 2024-11-10 NOTE — Patient Instructions (Signed)
 Medication Instructions:  INCREASE LOSARTAN  TO 50 MG DAILY START CARVEDILOL  6.25MG  (TAKE 1/2 TABLET - 3.125 MG) TWICE A DAY *If you need a refill on your cardiac medications before your next appointment, please call your pharmacy*  Lab Work: NO LABS If you have labs (blood work) drawn today and your tests are completely normal, you will receive your results only by: MyChart Message (if you have MyChart) OR A paper copy in the mail If you have any lab test that is abnormal or we need to change your treatment, we will call you to review the results.  Testing/Procedures:[address] Your physician has requested that you have a renal artery duplex. During this test, an ultrasound is used to evaluate blood flow to the kidneys. Allow one hour for this exam. Do not eat after midnight the day before and avoid carbonated beverages. Take your medications as you usually do.   Follow-Up: At [facility], you and your health needs are our priority.  As part of our continuing mission to provide you with exceptional heart care, our providers are all part of one team.  This team includes your primary Cardiologist (physician) and Advanced Practice Providers or APPs (Physician Assistants and Nurse Practitioners) who all work together to provide you with the care you need, when you need it.  Your next appointment:   3-4 week(s)  Provider:   One of our Advanced Practice Providers (APPs): Morse Clause, PA-C  Hanh Waddell Daniels, PA-C  Saddie Cleaves, NP  Olivia Pavy, PA-C Miriam Shams, NP  Leontine Salen, PA-C Josefa Beauvais, NP  [facility], PA-C [place], PA-C  [place], PA-C [place], [place]  Damien Braver, NP Jon Hails, PA-C  Waddell Donath, PA-C Dayna Dunn, PA-C  [place], PA-C [place], [place] Glendia Ferrier, PA-C Callie Goodrich, PA-C  [facility], NP Thom Sluder, PA-C  Alyssa White, NP Rollo Louder, PA-C Xika Zhao, NP    Lamarr Satterfield,  NP                     Then, Jerel Balding, MD will plan to see you again in 3-4 month(s).   Other Instructions

## 2024-11-12 NOTE — Progress Notes (Unsigned)
 "               [name] [address] [place] [address] Phone: [phone]   Assessment and Plan:     ***    Pertinent previous records reviewed include ***   Follow Up: ***     Subjective:   I, [name], am serving as a neurosurgeon for [name]   Chief Complaint: leg pain    HPI:  02/28/2023 Patient is a 39 year old female complaining of leg pain. Patient states that she had left pain for years, she was dx with bursitis, she used to get CSI but the pain has changed, she had pain when sitting for long time, and then has pain when trying to walk after sitting , pain radiates to the back of the leg, sleep is hard she is a side sleeper, no numbness or tingling, tylenol  doesn't help with the pain, she states she used to hear hip pop but doesn't do that stretching anymore that would cause the popping, she is TTP in that area , she states she can feel a knot    03/26/2023 Patient states she had some pain while on vacation. Back pain has been more frequent when she is in a sitting position for a long period of time. Left hip has been killing her   04/17/2023 Patient states after CSI pain calmed down . Went swimming yesterday so she is a little flared    06/20/2023 Patient states past couple of weeks leg pain has flared. PT tomorrow    07/19/2023 Patient states she is doing okay MRI caused some pain for a couple of days     08/21/2023 Patient states she is getting better. Notices pain when she is walking or sitting for long periods of time   01/01/2024 Patient states right now it is ok. It has become really bothersome if she sits too long. Sleep is uncomfortable where she has to keep moving around to set comfortable.  Started to flare back up this past month but was doing well before then. Had some pain in the front almost on the bone on the left side.    01/29/2024 Patient states she is doing a little better. Sat and  Sunday she was flared was driving back from the beach. Started PT    03/13/2024 Patient states in a little pain. Been doing physical therapy but it doesn't seem like that it is helping. The front pain in the hip area has gotten worse especially when she tries to get up and walk. Would like to know if it is time for another injection.    06/10/2024 Patient states she has had increased amount of pain at night . Wider area of pain now    07/04/2024 Patient states she is okay    09/18/2024 Patient states she is flared low back and leg. Is ruling out a fibromyalgia dx. PT has not helped states her last  appointment was yesterday   10/16/2024 Patient states last two weeks she has had increased pain. Meds worked the first week but has decreased in effectiveness. Would like to discuss an injection    11/13/2024 Patient states  Relevant Historical Information: Hypertension, DM type I, CKD  Additional pertinent review of systems negative.  Current Medications[1]   Objective:     There were no vitals filed for this visit.    There is no height or weight on file to calculate  BMI.    Physical Exam:    ***   Electronically signed by:  [name] 7:31 AM 11/12/24    [1]  Current Outpatient Medications:    albuterol  (PROVENTIL ) (2.5 MG/3ML) 0.083% nebulizer solution, Take 3 mLs (2.5 mg total) by nebulization every 6 (six) hours as needed for wheezing or shortness of breath. (Patient not taking: Reported on 11/10/2024), Disp: 150 mL, Rfl: 1   albuterol  (VENTOLIN  HFA) 108 (90 Base) MCG/ACT inhaler, TAKE 2 PUFFS BY MOUTH EVERY 6 HOURS AS NEEDED FOR WHEEZE OR SHORTNESS OF BREATH, Disp: 18 each, Rfl: 2   aspirin  81 MG chewable tablet, Chew 1 tablet (81 mg total) by mouth at bedtime., Disp: , Rfl:    Budeson-Glycopyrrol-Formoterol  (BREZTRI AEROSPHERE) 160-9-4.8 MCG/ACT AERO, Inhale 2 puffs into the lungs 2 (two) times daily., Disp: , Rfl:    carvedilol  (COREG ) 6.25  MG tablet, Take 0.5 tablets (3.125 mg total) by mouth 2 (two) times daily., Disp: 90 tablet, Rfl: 3   cloNIDine  (CATAPRES ) 0.1 MG tablet, Take 1 tablet (0.1 mg total) by mouth 2 (two) times daily as needed (anxiety)., Disp: 60 tablet, Rfl: 0   DULoxetine  (CYMBALTA ) 60 MG capsule, Take 1 capsule (60 mg total) by mouth daily., Disp: 30 capsule, Rfl: 1   FASENRA 30 MG/ML SOSY, Inject into the skin every 3 (three) months. (Patient not taking: Reported on 11/10/2024), Disp: , Rfl:    furosemide (LASIX) 20 MG tablet, Take 20 mg by mouth daily as needed. (Patient not taking: Reported on 11/10/2024), Disp: , Rfl:    Galcanezumab -gnlm (EMGALITY ) 120 MG/ML SOAJ, Inject 120 mg into the skin every 30 (thirty) days., Disp: 1 mL, Rfl: 3   GLUCAGON EMERGENCY 1 MG injection, Inject 1 mg into the skin as needed (hypoglycemia.)., Disp: , Rfl:    glucose blood (ACCU-CHEK GUIDE) test strip, Use to check blood sugar 3 time a day, Disp: 300 strip, Rfl: 4   insulin  glargine, 1 Unit Dial, (TOUJEO  SOLOSTAR) 300 UNIT/ML Solostar Pen, Inject 15 Units into the skin daily. (Patient not taking: Reported on 11/10/2024), Disp: 4.5 mL, Rfl: 0   insulin  lispro (HUMALOG ) 100 UNIT/ML injection, Inject up to 150 units daily according to insulin  pump settings., Disp: 10 mL, Rfl: 0   levonorgestrel  (MIRENA , 52 MG,) 20 MCG/24HR IUD, 1 each by Intrauterine route once., Disp: , Rfl:    LINZESS  72 MCG capsule, Take 72 mcg by mouth daily as needed., Disp: , Rfl:    lisdexamfetamine  (VYVANSE ) 40 MG capsule, Take 40 mg by mouth daily., Disp: , Rfl:    losartan  (COZAAR ) 50 MG tablet, Take 1 tablet (50 mg total) by mouth daily., Disp: 90 tablet, Rfl: 3   lumateperone  tosylate 21 MG CAPS, Take 21 mg by mouth daily., Disp: 30 capsule, Rfl: 2   montelukast  (SINGULAIR ) 10 MG tablet, Take 1 tablet (10 mg total) by mouth at bedtime., Disp: 90 tablet, Rfl: 1   pravastatin  (PRAVACHOL ) 20 MG tablet, Take 1 tablet (20 mg total) by mouth at bedtime., Disp: 90  tablet, Rfl: 0   tirzepatide  (MOUNJARO ) 15 MG/0.5ML Pen, Inject 15 mg into the skin once a week., Disp: , Rfl:    tiZANidine  (ZANAFLEX ) 2 MG tablet, Take 1 tablet (2 mg total) by mouth every 8 (eight) hours as needed for muscle spasms., Disp: 30 tablet, Rfl: 0   Ubrogepant  (UBRELVY ) 100 MG TABS, Take 1 tablet (100 mg total) by mouth daily as needed (migraine headaches.)., Disp: 30 tablet,  Rfl: 3   valACYclovir  (VALTREX ) 1000 MG tablet, Take 2 tablets (2,000 mg total) by mouth 2 (two) times daily. (Patient taking differently: Take 2,000 mg by mouth as needed.), Disp: 4 tablet, Rfl: 3  "

## 2024-11-13 ENCOUNTER — Encounter (HOSPITAL_COMMUNITY): Payer: Self-pay

## 2024-11-13 ENCOUNTER — Other Ambulatory Visit: Payer: Self-pay

## 2024-11-13 ENCOUNTER — Emergency Department (HOSPITAL_COMMUNITY)
Admission: EM | Admit: 2024-11-13 | Discharge: 2024-11-13 | Discharge status: Against Medical Advice | Source: Home / Self Care

## 2024-11-13 ENCOUNTER — Telehealth: Payer: Self-pay | Admitting: Cardiovascular Disease

## 2024-11-13 ENCOUNTER — Ambulatory Visit: Admitting: Sports Medicine

## 2024-11-13 NOTE — Telephone Encounter (Signed)
 STAT call to triage, Pt reports chest pain. States that her losartan  was recently increased and carvedilol  was prescribed, but she has not yet started it. She began having continuous chest pain radiating to her neck, at that time, BP was 110/66 with HR 121, and the patient stated she could feel her heart racing. After sitting down, the pain moved to her back. When she came downstairs to get dressed, she became dizzy. Due to ongoing continuous chest pain with associated tachycardia and dizziness, the patient was instructed to go to the emergency department immediately and to call EMS if she does not have a driver. Verbalizes understanding.

## 2024-11-13 NOTE — ED Triage Notes (Signed)
 Pateint bib [facility] from home with chest pain sharp that radiates to back and right side of neck. 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  She is diabetic and is hyperglycemic with EMS. She is complaint with meds and has a insulin  pump.   Meds enroute:   324asa 2 nitroglycerin

## 2024-11-13 NOTE — Telephone Encounter (Signed)
 Pt c/o of Chest Pain: STAT if active (IN THIS MOMENT) CP, including tightness, pressure, jaw pain, shoulder/upper arm/back pain, SOB, nausea, and vomiting.  1. Are you having CP right now (tightness, pressure, or discomfort)? yes  2. Are you experiencing any other symptoms (ex. SOB, nausea, vomiting, sweating)? No   3. How long have you been experiencing CP? 45 minutes   4. Is your CP continuous or coming and going? Continuous   5. Have you taken Nitroglycerin? No, pt doesn't have any.   6. If CP returns before callback, please consider calling 911. ?    Chest pain on right side and pain in neck. Transferred to Triage.

## 2024-11-13 NOTE — ED Notes (Signed)
 Pt stated she wanted to leave and go to [place]. RN made aware and IV removed. Pt was ambulatory out to lobby

## 2024-12-05 ENCOUNTER — Ambulatory Visit: Admitting: Professional Counselor

## 2024-12-11 ENCOUNTER — Ambulatory Visit (HOSPITAL_COMMUNITY)

## 2025-03-18 ENCOUNTER — Ambulatory Visit: Admitting: Cardiovascular Disease

## 2025-04-16 ENCOUNTER — Encounter
